# Patient Record
Sex: Female | Born: 1965 | Race: Black or African American | Hispanic: No | Marital: Single | Smoking: Former smoker
Health system: Southern US, Community
[De-identification: ages and names within clinical notes are randomized; demographics above are authoritative.]

## PROBLEM LIST (undated history)

## (undated) DIAGNOSIS — T8859XA Other complications of anesthesia, initial encounter: Secondary | ICD-10-CM

## (undated) DIAGNOSIS — A599 Trichomoniasis, unspecified: Secondary | ICD-10-CM

## (undated) DIAGNOSIS — G4730 Sleep apnea, unspecified: Secondary | ICD-10-CM

## (undated) DIAGNOSIS — I509 Heart failure, unspecified: Secondary | ICD-10-CM

## (undated) DIAGNOSIS — I2721 Secondary pulmonary arterial hypertension: Secondary | ICD-10-CM

## (undated) DIAGNOSIS — R0602 Shortness of breath: Secondary | ICD-10-CM

## (undated) DIAGNOSIS — I10 Essential (primary) hypertension: Secondary | ICD-10-CM

## (undated) DIAGNOSIS — U071 COVID-19: Secondary | ICD-10-CM

## (undated) DIAGNOSIS — I2699 Other pulmonary embolism without acute cor pulmonale: Secondary | ICD-10-CM

## (undated) DIAGNOSIS — K259 Gastric ulcer, unspecified as acute or chronic, without hemorrhage or perforation: Secondary | ICD-10-CM

## (undated) DIAGNOSIS — Z90710 Acquired absence of both cervix and uterus: Secondary | ICD-10-CM

## (undated) DIAGNOSIS — E119 Type 2 diabetes mellitus without complications: Secondary | ICD-10-CM

## (undated) DIAGNOSIS — R911 Solitary pulmonary nodule: Secondary | ICD-10-CM

## (undated) DIAGNOSIS — R7303 Prediabetes: Secondary | ICD-10-CM

## (undated) DIAGNOSIS — N83201 Unspecified ovarian cyst, right side: Secondary | ICD-10-CM

## (undated) DIAGNOSIS — Z803 Family history of malignant neoplasm of breast: Secondary | ICD-10-CM

## (undated) DIAGNOSIS — N920 Excessive and frequent menstruation with regular cycle: Secondary | ICD-10-CM

## (undated) DIAGNOSIS — D649 Anemia, unspecified: Secondary | ICD-10-CM

## (undated) DIAGNOSIS — G4733 Obstructive sleep apnea (adult) (pediatric): Secondary | ICD-10-CM

## (undated) DIAGNOSIS — M1990 Unspecified osteoarthritis, unspecified site: Secondary | ICD-10-CM

## (undated) DIAGNOSIS — E559 Vitamin D deficiency, unspecified: Secondary | ICD-10-CM

## (undated) DIAGNOSIS — C50212 Malignant neoplasm of upper-inner quadrant of left female breast: Secondary | ICD-10-CM

## (undated) DIAGNOSIS — M109 Gout, unspecified: Secondary | ICD-10-CM

## (undated) DIAGNOSIS — I82409 Acute embolism and thrombosis of unspecified deep veins of unspecified lower extremity: Secondary | ICD-10-CM

## (undated) DIAGNOSIS — T4145XA Adverse effect of unspecified anesthetic, initial encounter: Secondary | ICD-10-CM

## (undated) HISTORY — DX: Malignant neoplasm of upper-inner quadrant of left female breast: C50.212

## (undated) HISTORY — DX: Type 2 diabetes mellitus without complications: E11.9

## (undated) HISTORY — DX: Excessive and frequent menstruation with regular cycle: N92.0

## (undated) HISTORY — DX: Unspecified osteoarthritis, unspecified site: M19.90

## (undated) HISTORY — DX: Essential (primary) hypertension: I10

## (undated) HISTORY — DX: Obstructive sleep apnea (adult) (pediatric): G47.33

## (undated) HISTORY — DX: Acute embolism and thrombosis of unspecified deep veins of unspecified lower extremity: I82.409

## (undated) HISTORY — DX: Family history of malignant neoplasm of breast: Z80.3

## (undated) HISTORY — DX: Other pulmonary embolism without acute cor pulmonale: I26.99

## (undated) HISTORY — DX: Heart failure, unspecified: I50.9

## (undated) HISTORY — DX: Sleep apnea, unspecified: G47.30

## (undated) HISTORY — DX: Unspecified ovarian cyst, right side: N83.201

## (undated) HISTORY — DX: Prediabetes: R73.03

## (undated) HISTORY — DX: Gout, unspecified: M10.9

## (undated) HISTORY — PX: COLONOSCOPY: SHX174

## (undated) HISTORY — DX: Trichomoniasis, unspecified: A59.9

## (undated) HISTORY — DX: Solitary pulmonary nodule: R91.1

## (undated) HISTORY — DX: COVID-19: U07.1

## (undated) HISTORY — DX: Vitamin D deficiency, unspecified: E55.9

## (undated) HISTORY — DX: Gastric ulcer, unspecified as acute or chronic, without hemorrhage or perforation: K25.9

## (undated) HISTORY — DX: Shortness of breath: R06.02

---

## 2006-03-30 HISTORY — PX: POLYPECTOMY: SHX149

## 2006-05-10 ENCOUNTER — Other Ambulatory Visit: Admission: RE | Admit: 2006-05-10 | Discharge: 2006-05-10 | Payer: Self-pay | Admitting: Gynecology

## 2006-05-26 ENCOUNTER — Encounter: Admission: RE | Admit: 2006-05-26 | Discharge: 2006-05-26 | Payer: Self-pay | Admitting: Gynecology

## 2006-06-03 ENCOUNTER — Encounter: Admission: RE | Admit: 2006-06-03 | Discharge: 2006-06-03 | Payer: Self-pay | Admitting: Gynecology

## 2006-06-23 ENCOUNTER — Encounter (INDEPENDENT_AMBULATORY_CARE_PROVIDER_SITE_OTHER): Payer: Self-pay | Admitting: *Deleted

## 2006-06-23 ENCOUNTER — Ambulatory Visit (HOSPITAL_BASED_OUTPATIENT_CLINIC_OR_DEPARTMENT_OTHER): Admission: RE | Admit: 2006-06-23 | Discharge: 2006-06-23 | Payer: Self-pay | Admitting: Gynecology

## 2007-07-24 ENCOUNTER — Emergency Department (HOSPITAL_COMMUNITY): Admission: EM | Admit: 2007-07-24 | Discharge: 2007-07-25 | Payer: Self-pay | Admitting: Emergency Medicine

## 2008-09-25 ENCOUNTER — Ambulatory Visit (HOSPITAL_COMMUNITY): Admission: RE | Admit: 2008-09-25 | Discharge: 2008-09-25 | Payer: Self-pay | Admitting: Family Medicine

## 2010-04-20 ENCOUNTER — Encounter: Payer: Self-pay | Admitting: Gynecology

## 2010-08-15 NOTE — H&P (Signed)
[name], [name]              ACCOUNT NO.:  [id number]   MEDICAL RECORD NO.:  [id number]          PATIENT TYPE:  AMB   LOCATION:  [facility]                         FACILITY:  [facility]   PHYSICIAN:  Juan H. Toney Rakes, M.D.DATE OF BIRTH:  06-19-1965   DATE OF ADMISSION:  DATE OF DISCHARGE:                              HISTORY & PHYSICAL   The patient is scheduled for surgery on Wednesday, June 23, 2006, at  1:00 p.m. at [facility].   CHIEF COMPLAINT:  1. Menometrorrhagia.  2. Endometrial polyp.   HISTORY:  The patient is a 45 year old gravida 3, para 1, ab 2 who has  been evaluated menometrorrhagia, had an endometrial biopsy in the office  with benign secretory endometrium with no hyperplasia identified. She  had a sonohysterogram done on May 21, 2006.  She was noted to have  intramural myoma measuring 36 x 28-mm, 38 x 32-mm to the right fundus,  and multiple cystic areas in the myometrium consistent with adenomyomas.  The right and left ovary were normal and sonohysterogram demonstrated  posterior wall echogenic lesion measuring 11 x 8-mm on the right  posterior wall suspicious for an endometrial polyp.  The patient is  scheduled to undergo resective scopic polypectomy and was seen in the  office for preoperative consultation today June 22, 2006, and a  laminaria was placed intracervically to facilitate the insertion of the  operative hysteroscope at the time of surgery.   PAST MEDICAL HISTORY:  SHE IS ALLERGIC TO SULFA AND AMPICILLIN.  She  smokes approximately 3 cigarettes per day. She also has had 2  miscarriages in the past, 1 cesarean section, and 1 vaginal delivery.   FAMILY HISTORY:  Significant for hypertension.   PHYSICAL EXAMINATION:  VITAL SIGNS:  The patient weight 224 pounds, 5  feet 5-1/2 inches tall.  HEENT:  Unremarkable.  NECK:  Supple.  Trachea midline.  No carotid bruits.  No thyromegaly.  LUNGS:  Clear to auscultation without rhonchi or  wheezes.  HEART:  Regular rate and rhythm.  No murmurs or gallops.  BREAST EXAM:  Not done.  ABDOMEN:  Soft, nontender, without rebound or guarding.  PELVIC:  Bartholin's, urethral, and Skene's glands within normal limits.  VAGINA AND CERVIX:  No lesions or discharge.  Uterus slightly irregular.  Adnexa difficult to evaluate due to the patient's weight of 224 pounds.  RECTAL EXAM:  Deferred.   ASSESSMENT:  This is a patient 45 years of age with menometrorrhagia.  Workup consisted of a benign endometrial biopsy, sonohysterogram  demonstrating a posterior polyp measuring 11 x 8-mm.  The patient with  several intramural myomas and myometrium suspicious for adenomyosis.  The patient wanted to proceed with this before she attempts to get  pregnant whether she may resort through IVF which probably will be  recommended at the age of 14 but she will also need a baseline [place] on  day 3 as well.  She was counseled as to the risks, benefits, and pros  and cons of resective scopic polypectomy.  Her preoperative labs had  demonstrated that she had 100,000 pure colony  counts of proteus and she  will receive Cefoxitin 1 gm IV intraoperatively.  All questions were  answered and  will follow accordingly.  OF NOTE: She was given a prescription of  Lortab 7.5/500 to take 1 p.o. q.4-6 hours in the event of any cramping,  especially since the laminaria was also placed today before her surgery.   PLAN:  As per assessment above.      Juan H. Toney Rakes, M.D.  Electronically Signed     JHF/MEDQ  D:  06/22/2006  T:  06/22/2006  Job:  [id number]

## 2010-08-15 NOTE — Op Note (Signed)
NAMEEVON, DEJARNETT              ACCOUNT NO.:  [id number]   MEDICAL RECORD NO.:  [id number]          PATIENT TYPE:  AMB   LOCATION:  [facility]                         FACILITY:  [facility]   PHYSICIAN:  Juan H. Toney Rakes, M.D.DATE OF BIRTH:  1965/11/22   DATE OF PROCEDURE:  06/23/2006  DATE OF DISCHARGE:                               OPERATIVE REPORT   INDICATIONS FOR OPERATION:  45 year old gravida 3, para 1 AB 2 with  history of menorrhagia and had an outpatient endometrial biopsy with  benign secretory endometrium and a sonohysterogram which had  demonstrated what appeared to be an endometrial polyp.   PREOPERATIVE DIAGNOSES:  1. Dysfunctional bleeding.  2. Endometrial polyp.   POSTOPERATIVE DIAGNOSES:  1. Dysfunctional bleeding.  2. Endometrial polyp.   ANESTHESIA:  General endotracheal anesthesia.   PROCEDURE PERFORMED:  Resectoscopic polypectomy.   FINDINGS:  The patient had what appeared to be a small polyp with  dimensions 11 x 8 mm in the posterior uterine wall which was very  friable, both tubal ostia were identified and no other abnormality in  the uterine cavity or in the endocervical canal were noted.   DESCRIPTION OF OPERATION:  After the patient adequately counseled she  was taken to the operating room where she underwent successful general  endotracheal anesthesia.  The patient received a gram of cefoxitin and  it was noted on preoperative lab that she had evidence of urinary tract  infections however, this wide spectrum antibiotic would cover it as  well.  In the operating room after general endotracheal anesthesia been  obtained the vagina and perineum were prepped and draped in usual  sterile fashion.  A laminaria that had been placed intracervically the  night before was removed to facilitate insertion of the operative  hysteroscope.  Single-tooth tenaculum was used in the anterior cervical  lip.  The uterus sounded to approximately 8 cm. The Elba Barman  operative  resectoscope was then introduced into the intrauterine cavity.  3%  sorbitol was distending media.  Systematic inspection of the entire  uterine cavity demonstrated a small polyp in the posterior uterine wall  and upon opening the operative resectoscope to resect the polyp, it came  off its pedicle and there was no bleeding.  This was passed off the  operative field for histological evaluation.  The rest of the cavity was  inspected, both tubal ostia were identified.  No other abnormality in  the intrauterine cavity of the cervical canal were noted.  Pictures were  obtained to keep a copy in the hospital records and a second copy at  [facility] to share with the patient at postop visit.  The  single-tooth tenaculum was removed.  There was no bleeding from the  tenaculum.  The patient  was extubated and transferred to recovery with stable vital signs.  Blood loss from procedure was minimal.  Fluid resuscitation 1000 mL of  lactated Ringer and there was 0 deficit of 3% sorbitol.  The patient was  transferred with stable vital signs after being extubated.      Juan H. Toney Rakes, M.D.  Electronically Signed  JHF/MEDQ  D:  06/23/2006  T:  06/23/2006  Job:  [id number]

## 2010-12-23 LAB — URINE MICROSCOPIC-ADD ON

## 2010-12-23 LAB — POCT CARDIAC MARKERS
CKMB, poc: 1
CKMB, poc: <1 — ABNORMAL LOW
Myoglobin, poc: 60.7
Operator id: 272551
Troponin i, poc: <0.05

## 2010-12-23 LAB — COMPREHENSIVE METABOLIC PANEL
ALT: 14
Albumin: 3.3 — ABNORMAL LOW
Alkaline Phosphatase: 71
BUN: 8
CO2: 23
Creatinine, Ser: 0.79
Potassium: 3.4 — ABNORMAL LOW
Sodium: 138

## 2010-12-23 LAB — URINALYSIS, ROUTINE W REFLEX MICROSCOPIC
Bilirubin Urine: NEGATIVE
Glucose, UA: NEGATIVE
Ketones, ur: NEGATIVE

## 2010-12-23 LAB — POCT I-STAT, CHEM 8
BUN: 9
Chloride: 110
Creatinine, Ser: <0.2 — ABNORMAL LOW
Hemoglobin: 11.2 — ABNORMAL LOW

## 2010-12-23 LAB — DIFFERENTIAL
Basophils Absolute: 0
Eosinophils Relative: 1
Lymphocytes Relative: 26
Lymphs Abs: 1.9
Monocytes Absolute: 0.4
Neutro Abs: 4.7
Neutrophils Relative %: 66

## 2010-12-23 LAB — CBC
Platelets: 289
WBC: 7.2

## 2011-04-28 ENCOUNTER — Other Ambulatory Visit: Payer: Self-pay

## 2011-04-28 ENCOUNTER — Encounter (HOSPITAL_COMMUNITY): Payer: Self-pay | Admitting: Emergency Medicine

## 2011-04-28 ENCOUNTER — Emergency Department (HOSPITAL_COMMUNITY): Payer: Self-pay

## 2011-04-28 ENCOUNTER — Emergency Department (HOSPITAL_COMMUNITY)
Admission: EM | Admit: 2011-04-28 | Discharge: 2011-04-28 | Disposition: A | Discharge status: Home / Self Care | Payer: Self-pay | Attending: Emergency Medicine | Admitting: Emergency Medicine

## 2011-04-28 DIAGNOSIS — R231 Pallor: Secondary | ICD-10-CM | POA: Insufficient documentation

## 2011-04-28 DIAGNOSIS — R0989 Other specified symptoms and signs involving the circulatory and respiratory systems: Secondary | ICD-10-CM | POA: Insufficient documentation

## 2011-04-28 DIAGNOSIS — R110 Nausea: Secondary | ICD-10-CM | POA: Insufficient documentation

## 2011-04-28 DIAGNOSIS — J189 Pneumonia, unspecified organism: Secondary | ICD-10-CM

## 2011-04-28 DIAGNOSIS — R059 Cough, unspecified: Secondary | ICD-10-CM | POA: Insufficient documentation

## 2011-04-28 DIAGNOSIS — N949 Unspecified condition associated with female genital organs and menstrual cycle: Secondary | ICD-10-CM | POA: Insufficient documentation

## 2011-04-28 DIAGNOSIS — R079 Chest pain, unspecified: Secondary | ICD-10-CM | POA: Insufficient documentation

## 2011-04-28 DIAGNOSIS — R05 Cough: Secondary | ICD-10-CM | POA: Insufficient documentation

## 2011-04-28 DIAGNOSIS — R0602 Shortness of breath: Secondary | ICD-10-CM | POA: Insufficient documentation

## 2011-04-28 DIAGNOSIS — D649 Anemia, unspecified: Secondary | ICD-10-CM

## 2011-04-28 LAB — CBC
HCT: 26.4 % — ABNORMAL LOW (ref 36.0–46.0)
MCHC: 27.3 g/dL — ABNORMAL LOW (ref 30.0–36.0)
MCV: 63.9 fL — ABNORMAL LOW (ref 78.0–100.0)
Platelets: 244 10*3/uL (ref 150–400)
RBC: 4.13 MIL/uL (ref 3.87–5.11)
WBC: 9.8 10*3/uL (ref 4.0–10.5)

## 2011-04-28 LAB — BASIC METABOLIC PANEL
CO2: 24 mEq/L (ref 19–32)
Creatinine, Ser: 0.75 mg/dL (ref 0.50–1.10)
GFR calc Af Amer: >90 mL/min (ref >90)
Glucose, Bld: 111 mg/dL — ABNORMAL HIGH (ref 70–99)
Sodium: 136 mEq/L (ref 135–145)

## 2011-04-28 LAB — DIFFERENTIAL
Eosinophils Absolute: 0.1 10*3/uL (ref 0.0–0.7)
Eosinophils Relative: 1 % (ref 0–5)
Lymphocytes Relative: 17 % (ref 12–46)
Neutro Abs: 7.3 10*3/uL (ref 1.7–7.7)

## 2011-04-28 LAB — ABO/RH: ABO/RH(D): O POS

## 2011-04-28 MED ORDER — DOXYCYCLINE HYCLATE 100 MG PO CAPS: 100.0000 mg | ORAL_CAPSULE | Freq: Two times a day (BID) | ORAL | Status: AC

## 2011-04-28 MED ORDER — MOXIFLOXACIN HCL 400 MG PO TABS
400.0000 mg | ORAL_TABLET | Freq: Once | ORAL | Status: AC
  Administered 2011-04-28: 400 mg via ORAL
  Filled 2011-04-28: qty 1

## 2011-04-28 MED ORDER — MOXIFLOXACIN HCL 400 MG PO TABS: 400.0000 mg | ORAL_TABLET | Freq: Every day | ORAL | Status: AC

## 2011-04-28 MED ORDER — OXYCODONE-ACETAMINOPHEN 5-325 MG PO TABS
2.0000 | ORAL_TABLET | Freq: Once | ORAL | Status: AC
  Administered 2011-04-28: 2 via ORAL
  Filled 2011-04-28: qty 2

## 2011-04-28 NOTE — ED Notes (Signed)
PT. REPORTS MID STERNAL CHEST PAIN WITH SOB , DRY COUGH AND NAUSEA .

## 2011-04-28 NOTE — ED Notes (Signed)
Discharge instructions reviewed, verbalizes understanding.  No questions asked; no further c/o's voiced.  Pt ambulatory to lobby.  NAD noted.

## 2011-04-28 NOTE — ED Provider Notes (Signed)
Patient with history of anemia here with pneumonia.  Patient treated with avelox by Dr. Lita Mains.  Patient transfused 2 units prbc given here and patient feels better.  Plan op avelox.  Shaune Pollack, MD 04/28/11 [phone]

## 2011-04-28 NOTE — ED Provider Notes (Signed)
History     CSN: [id number]  Arrival date & time 04/28/11  0402   First MD Initiated Contact with Patient 04/28/11 [phone]      Chief Complaint  Patient presents with  . Chest Pain    (Consider location/radiation/quality/duration/timing/severity/associated sxs/prior treatment) Patient is a 46 y.o. female presenting with chest pain. The history is provided by the patient.  Chest Pain The chest pain began yesterday. Chest pain occurs constantly. The chest pain is worsening. The pain is associated with breathing and coughing. The quality of the pain is described as pleuritic. The pain does not radiate. Chest pain is worsened by deep breathing. Primary symptoms include shortness of breath, cough and nausea. Pertinent negatives for primary symptoms include no fever, no abdominal pain, no vomiting and no dizziness.  Pertinent negatives for associated symptoms include no diaphoresis, no numbness and no weakness.     History reviewed. No pertinent past medical history.  History reviewed. No pertinent past surgical history.  No family history on file.  History  Substance Use Topics  . Smoking status: Never Smoker   . Smokeless tobacco: Not on file  . Alcohol Use: Yes    OB History    Grav Para Term Preterm Abortions TAB SAB Ect Mult Living                  Review of Systems  Constitutional: Negative for fever, chills and diaphoresis.  Respiratory: Positive for cough and shortness of breath.   Cardiovascular: Positive for chest pain.  Gastrointestinal: Positive for nausea. Negative for vomiting and abdominal pain.  Genitourinary: Positive for menstrual problem. Negative for vaginal bleeding and pelvic pain.  Musculoskeletal: Negative for back pain.  Skin: Negative for color change, pallor and rash.  Neurological: Negative for dizziness, weakness, numbness and headaches.    Allergies  Ampicillin and Sulfa antibiotics  Home Medications   Current Outpatient Rx  Name Route  Sig Dispense Refill  . DOXYCYCLINE HYCLATE 100 MG PO CAPS Oral Take 1 capsule (100 mg total) by mouth 2 (two) times daily. 20 capsule 0  . MOXIFLOXACIN HCL 400 MG PO TABS Oral Take 1 tablet (400 mg total) by mouth daily. 10 tablet 0    BP 106/57  Pulse 78  Temp(Src) 98.5 F (36.9 C) (Oral)  Resp 19  SpO2 99%  LMP 04/14/2011  Physical Exam  Nursing note and vitals reviewed. Constitutional: She is oriented to person, place, and time. She appears well-developed and well-nourished. No distress.  HENT:  Head: Normocephalic and atraumatic.  Mouth/Throat: Oropharynx is clear and moist.  Eyes: EOM are normal. Pupils are equal, round, and reactive to light.  Neck: Normal range of motion. Neck supple.  Cardiovascular: Normal rate and regular rhythm.   Pulmonary/Chest: Effort normal. No respiratory distress. She has no wheezes. She has rales.       Rales in L chest base  Abdominal: Soft. Bowel sounds are normal. There is no tenderness. There is no rebound and no guarding.  Musculoskeletal: Normal range of motion. She exhibits no edema and no tenderness.  Neurological: She is alert and oriented to person, place, and time.  Skin: Skin is warm and dry. No rash noted. No erythema.       Mild pallor noted  Psychiatric: She has a normal mood and affect. Her behavior is normal.    ED Course  Procedures (including critical care time)  Labs Reviewed  CBC - Abnormal; Notable for the following:    Hemoglobin 7.2 (*)  HCT 26.4 (*)    MCV 63.9 (*)    MCH 17.4 (*)    MCHC 27.3 (*)    RDW 20.8 (*)    All other components within normal limits  BASIC METABOLIC PANEL - Abnormal; Notable for the following:    Glucose, Bld 111 (*)    All other components within normal limits  DIFFERENTIAL  POCT I-STAT TROPONIN I  CULTURE, BLOOD (ROUTINE X 2)  CULTURE, BLOOD (ROUTINE X 2)  PREPARE RBC (CROSSMATCH)  TYPE AND SCREEN  ABO/RH  BLOOD TRANSFUSION REPORT - SCANNED   No results found.   1.  Pneumonia   2. Anemia      Date: 04/28/2011  Rate: 96  Rhythm: normal sinus rhythm  QRS Axis: normal  Intervals: normal  ST/T Wave abnormalities: nonspecific ST changes  Conduction Disutrbances:none  Narrative Interpretation:   Old EKG Reviewed: none available    MDM    On further questioning, pt states she has anemia from heavy menstrual cycles and is followed by OB-gyn. Her last known HGB was around 9. No current bleeding. Will transfuse in ED and D/C home.         Julianne Rice, MD 05/01/11 [phone]

## 2011-04-29 LAB — TYPE AND SCREEN
Antibody Screen: NEGATIVE
Unit division: 0

## 2011-05-04 LAB — CULTURE, BLOOD (ROUTINE X 2): Culture: NO GROWTH

## 2011-05-05 LAB — CULTURE, BLOOD (ROUTINE X 2)

## 2011-05-05 NOTE — ED Notes (Signed)
[facility] lab reported pt has positive BC; chart taken to dr Lita Mains for review.

## 2011-05-05 NOTE — ED Notes (Signed)
Dr Lita Mains states to call pt and check on her; if feeling better f/u with pcp; if fever, chills, weakness, or other problems return to ED. States she feels fine. Encouraged to return to ED as needed or any concerns.

## 2011-07-06 ENCOUNTER — Encounter: Payer: Self-pay | Admitting: Obstetrics & Gynecology

## 2011-08-07 ENCOUNTER — Encounter: Payer: Self-pay | Admitting: Obstetrics & Gynecology

## 2011-09-10 ENCOUNTER — Encounter: Payer: Self-pay | Admitting: Obstetrics & Gynecology

## 2012-03-30 DIAGNOSIS — I82409 Acute embolism and thrombosis of unspecified deep veins of unspecified lower extremity: Secondary | ICD-10-CM

## 2012-03-30 DIAGNOSIS — I2699 Other pulmonary embolism without acute cor pulmonale: Secondary | ICD-10-CM

## 2012-03-30 HISTORY — DX: Other pulmonary embolism without acute cor pulmonale: I26.99

## 2012-03-30 HISTORY — DX: Acute embolism and thrombosis of unspecified deep veins of unspecified lower extremity: I82.409

## 2012-09-21 ENCOUNTER — Encounter: Payer: Self-pay | Admitting: Obstetrics & Gynecology

## 2012-10-04 ENCOUNTER — Encounter (HOSPITAL_COMMUNITY): Payer: Self-pay

## 2012-10-04 ENCOUNTER — Emergency Department (HOSPITAL_COMMUNITY): Payer: Self-pay

## 2012-10-04 ENCOUNTER — Inpatient Hospital Stay (HOSPITAL_COMMUNITY)
Admission: EM | Admit: 2012-10-04 | Discharge: 2012-10-07 | DRG: 299 | Disposition: A | Discharge status: Home / Self Care | Payer: MEDICAID | Attending: Internal Medicine | Admitting: Internal Medicine

## 2012-10-04 DIAGNOSIS — Z791 Long term (current) use of non-steroidal anti-inflammatories (NSAID): Secondary | ICD-10-CM

## 2012-10-04 DIAGNOSIS — R911 Solitary pulmonary nodule: Secondary | ICD-10-CM

## 2012-10-04 DIAGNOSIS — I2699 Other pulmonary embolism without acute cor pulmonale: Secondary | ICD-10-CM | POA: Diagnosis present

## 2012-10-04 DIAGNOSIS — Z7982 Long term (current) use of aspirin: Secondary | ICD-10-CM

## 2012-10-04 DIAGNOSIS — Z87891 Personal history of nicotine dependence: Secondary | ICD-10-CM

## 2012-10-04 DIAGNOSIS — I8290 Acute embolism and thrombosis of unspecified vein: Secondary | ICD-10-CM | POA: Diagnosis present

## 2012-10-04 DIAGNOSIS — Z881 Allergy status to other antibiotic agents status: Secondary | ICD-10-CM

## 2012-10-04 DIAGNOSIS — I824Y9 Acute embolism and thrombosis of unspecified deep veins of unspecified proximal lower extremity: Principal | ICD-10-CM | POA: Diagnosis present

## 2012-10-04 DIAGNOSIS — I82409 Acute embolism and thrombosis of unspecified deep veins of unspecified lower extremity: Secondary | ICD-10-CM

## 2012-10-04 DIAGNOSIS — M79609 Pain in unspecified limb: Secondary | ICD-10-CM

## 2012-10-04 DIAGNOSIS — D509 Iron deficiency anemia, unspecified: Secondary | ICD-10-CM | POA: Diagnosis present

## 2012-10-04 DIAGNOSIS — E876 Hypokalemia: Secondary | ICD-10-CM | POA: Diagnosis present

## 2012-10-04 DIAGNOSIS — R0602 Shortness of breath: Secondary | ICD-10-CM

## 2012-10-04 DIAGNOSIS — I82402 Acute embolism and thrombosis of unspecified deep veins of left lower extremity: Secondary | ICD-10-CM

## 2012-10-04 HISTORY — DX: Anemia, unspecified: D64.9

## 2012-10-04 LAB — BASIC METABOLIC PANEL
Calcium: 9.1 mg/dL (ref 8.4–10.5)
GFR calc Af Amer: >90 mL/min (ref >90)
GFR calc non Af Amer: >90 mL/min (ref >90)
Sodium: 136 mEq/L (ref 135–145)

## 2012-10-04 LAB — CBC WITH DIFFERENTIAL/PLATELET
Basophils Relative: 1 % (ref 0–1)
Eosinophils Relative: 1 % (ref 0–5)
HCT: 21.2 % — ABNORMAL LOW (ref 36.0–46.0)
Hemoglobin: 5.7 g/dL — CL (ref 12.0–15.0)
MCHC: 26.9 g/dL — ABNORMAL LOW (ref 30.0–36.0)
MCV: 62.9 fL — ABNORMAL LOW (ref 78.0–100.0)
Monocytes Absolute: 0.7 10*3/uL (ref 0.1–1.0)
Monocytes Relative: 8 % (ref 3–12)
Neutro Abs: 5.6 10*3/uL (ref 1.7–7.7)
RDW: 20.5 % — ABNORMAL HIGH (ref 11.5–15.5)

## 2012-10-04 LAB — CBC
HCT: 21.7 % — ABNORMAL LOW (ref 36.0–46.0)
Hemoglobin: 5.7 g/dL — CL (ref 12.0–15.0)
MCH: 16.6 pg — ABNORMAL LOW (ref 26.0–34.0)
MCHC: 26.3 g/dL — ABNORMAL LOW (ref 30.0–36.0)
MCV: 63.3 fL — ABNORMAL LOW (ref 78.0–100.0)
Platelets: 198 K/uL (ref 150–400)
RBC: 3.43 MIL/uL — ABNORMAL LOW (ref 3.87–5.11)
RDW: 20.2 % — ABNORMAL HIGH (ref 11.5–15.5)
WBC: 7.7 K/uL (ref 4.0–10.5)

## 2012-10-04 LAB — PROTIME-INR
INR: 1.14 (ref 0.00–1.49)
Prothrombin Time: 14.4 s (ref 11.6–15.2)

## 2012-10-04 LAB — D-DIMER, QUANTITATIVE: D-Dimer, Quant: 5.89 ug/mL-FEU — ABNORMAL HIGH (ref 0.00–0.48)

## 2012-10-04 MED ORDER — MORPHINE SULFATE 2 MG/ML IJ SOLN: 2.0000 mg | INTRAMUSCULAR | Status: DC | PRN

## 2012-10-04 MED ORDER — ENOXAPARIN SODIUM 100 MG/ML ~~LOC~~ SOLN
100.0000 mg | Freq: Once | SUBCUTANEOUS | Status: AC
  Administered 2012-10-04: 100 mg via SUBCUTANEOUS
  Filled 2012-10-04: qty 1

## 2012-10-04 MED ORDER — HEPARIN (PORCINE) IN NACL 100-0.45 UNIT/ML-% IJ SOLN
1300.0000 [IU]/h | INTRAMUSCULAR | Status: DC
  Administered 2012-10-04 – 2012-10-05 (×2): 1300 [IU]/h via INTRAVENOUS
  Filled 2012-10-04 (×4): qty 250

## 2012-10-04 MED ORDER — WARFARIN SODIUM 10 MG PO TABS
10.0000 mg | ORAL_TABLET | Freq: Once | ORAL | Status: DC
  Filled 2012-10-04: qty 1

## 2012-10-04 MED ORDER — WARFARIN SODIUM 10 MG PO TABS
10.0000 mg | ORAL_TABLET | Freq: Once | ORAL | Status: AC
  Administered 2012-10-04: 10 mg via ORAL
  Filled 2012-10-04: qty 1

## 2012-10-04 MED ORDER — IOHEXOL 350 MG/ML SOLN
100.0000 mL | Freq: Once | INTRAVENOUS | Status: AC | PRN
  Administered 2012-10-04: 100 mL via INTRAVENOUS

## 2012-10-04 MED ORDER — FUROSEMIDE 10 MG/ML IJ SOLN
20.0000 mg | Freq: Once | INTRAMUSCULAR | Status: AC
  Administered 2012-10-05: 20 mg via INTRAVENOUS
  Filled 2012-10-04: qty 2

## 2012-10-04 MED ORDER — SODIUM CHLORIDE 0.9 % IJ SOLN
3.0000 mL | Freq: Two times a day (BID) | INTRAMUSCULAR | Status: DC
  Administered 2012-10-05 – 2012-10-06 (×4): 3 mL via INTRAVENOUS

## 2012-10-04 MED ORDER — WARFARIN - PHARMACIST DOSING INPATIENT: Freq: Every day | Status: DC

## 2012-10-04 MED ORDER — ACETAMINOPHEN 325 MG PO TABS: 650.0000 mg | ORAL_TABLET | Freq: Four times a day (QID) | ORAL | Status: DC | PRN

## 2012-10-04 MED ORDER — COUMADIN BOOK
Freq: Once | Status: AC
  Administered 2012-10-04: 1
  Filled 2012-10-04: qty 1

## 2012-10-04 MED ORDER — POTASSIUM CHLORIDE CRYS ER 20 MEQ PO TBCR
40.0000 meq | EXTENDED_RELEASE_TABLET | Freq: Once | ORAL | Status: AC
  Administered 2012-10-04: 40 meq via ORAL
  Filled 2012-10-04: qty 2

## 2012-10-04 MED ORDER — SODIUM CHLORIDE 0.9 % IJ SOLN: 3.0000 mL | INTRAMUSCULAR | Status: DC | PRN

## 2012-10-04 MED ORDER — WARFARIN VIDEO
Freq: Once | Status: AC
  Administered 2012-10-05: 10:00:00

## 2012-10-04 MED ORDER — ONDANSETRON HCL 4 MG/2ML IJ SOLN: 4.0000 mg | Freq: Four times a day (QID) | INTRAMUSCULAR | Status: DC | PRN

## 2012-10-04 MED ORDER — ONDANSETRON HCL 4 MG PO TABS: 4.0000 mg | ORAL_TABLET | Freq: Four times a day (QID) | ORAL | Status: DC | PRN

## 2012-10-04 MED ORDER — ACETAMINOPHEN 650 MG RE SUPP: 650.0000 mg | Freq: Four times a day (QID) | RECTAL | Status: DC | PRN

## 2012-10-04 MED ORDER — OXYCODONE HCL 5 MG PO TABS
5.0000 mg | ORAL_TABLET | ORAL | Status: DC | PRN
  Administered 2012-10-04 – 2012-10-05 (×3): 5 mg via ORAL
  Filled 2012-10-04 (×3): qty 1

## 2012-10-04 MED ORDER — SODIUM CHLORIDE 0.9 % IV SOLN: 250.0000 mL | INTRAVENOUS | Status: DC | PRN

## 2012-10-04 NOTE — ED Provider Notes (Addendum)
History    CSN: [id number] Arrival date & time 10/04/12  1345  First MD Initiated Contact with Patient 10/04/12 1349     Chief Complaint  Patient presents with  . Shortness of Breath    HPI Patient developed exertional shortness of breath at the new pastor days.  She's never had shortness of breath like this before.  She denies chest pain orthopnea.  She reports a new cough.  She also reports new swelling of her left lower extremity with past several days and pain below her left knee.  No prior history of DVT.  No history of pulmonary embolism.  She denies fevers or chills.  No productive cough.  No history of cardiac disease or congestive heart failure.  No recent long travel or surgery.  She is not on estrogens.  She does not smoke cigarettes.  Symptoms are mild to moderate in severity.  Nothing improves her symptoms.   History reviewed. No pertinent past medical history. History reviewed. No pertinent past surgical history. No family history on file. History  Substance Use Topics  . Smoking status: Former Research scientist (life sciences)  . Smokeless tobacco: Not on file  . Alcohol Use: Yes     Comment: social   OB History   Grav Para Term Preterm Abortions TAB SAB Ect Mult Living                 Review of Systems  All other systems reviewed and are negative.    Allergies  Ampicillin and Sulfa antibiotics  Home Medications  No current outpatient prescriptions on file. BP 146/79  Pulse 97  Temp(Src) 98.3 F (36.8 C) (Oral)  Resp 20  Wt 230 lb (104.327 kg)  SpO2 94%  LMP 09/27/2012 Physical Exam  Nursing note and vitals reviewed. Constitutional: She is oriented to person, place, and time. She appears well-developed and well-nourished. No distress.  HENT:  Head: Normocephalic and atraumatic.  Eyes: EOM are normal.  Neck: Normal range of motion.  Cardiovascular: Normal rate, regular rhythm and normal heart sounds.   Pulmonary/Chest: Effort normal and breath sounds normal.  Abdominal:  Soft. She exhibits no distension. There is no tenderness.  Musculoskeletal: Normal range of motion.  Mild tenderness in left popliteal region without evidence of left lower extremity swelling as compared to right.  No tenderness of her left medial thigh  Neurological: She is alert and oriented to person, place, and time.  Skin: Skin is warm and dry.  Psychiatric: She has a normal mood and affect. Judgment normal.    ED Course  Procedures (including critical care time)   Date: 10/04/2012  Rate: 93  Rhythm: normal sinus rhythm  QRS Axis: normal  Intervals: normal  ST/T Wave abnormalities: normal  Conduction Disutrbances: none  Narrative Interpretation:   Old EKG Reviewed: No significant changes noted     *PRELIMINARY RESULTS*  Vascular Ultrasound  Left lower extremity venous duplex has been completed. Preliminary findings: Left= positive for DVT involving the popliteal vein and posterior tibial veins.   Labs Reviewed  BASIC METABOLIC PANEL - Abnormal; Notable for the following:    Potassium 3.3 (*)    Glucose, Bld 106 (*)    All other components within normal limits  D-DIMER, QUANTITATIVE - Abnormal; Notable for the following:    D-Dimer, Quant 5.89 (*)    All other components within normal limits  CBC WITH DIFFERENTIAL  TYPE AND SCREEN   Dg Chest 2 View  10/04/2012   *RADIOLOGY REPORT*  Clinical  Data: Shortness of breath, history of bronchitis  CHEST - 2 VIEW  Comparison: 04/28/2011  Findings: Borderline cardiomegaly.  No acute infiltrate or pleural effusion.  No pulmonary edema. Bony thorax is stable.  IMPRESSION: No active disease.  Borderline cardiomegaly.   Original Report Authenticated By: Lahoma Crocker, M.D.   I personally reviewed the imaging tests through PACS system I reviewed available ER/hospitalization records through the EMR   No diagnosis found.  MDM  Patient is positive for DVT in her left lower extremity.  This is likely the presentation of a DVT and now new  pulmonary embolism.  CT scan of her chest is pending at this time.  The patient has been dosed with 1 mg per kilogram of Lovenox.   Hoy Morn, MD 10/04/12 [place], MD 10/04/12 1524

## 2012-10-04 NOTE — Progress Notes (Signed)
Patient activated my chart. Lucius Conn BSN, RN-BC Admissions RN  10/04/2012 4:46 PM

## 2012-10-04 NOTE — Progress Notes (Signed)
[facility] has seen patient. Pt stated that she was pending insurance at her job. Stated she should have it in September. Provided her with a list of primary care resources.

## 2012-10-04 NOTE — Progress Notes (Signed)
ANTICOAGULATION CONSULT NOTE - Initial Consult  Pharmacy Consult for Heparin/Warfarin Indication: pulmonary embolus and DVT  Allergies  Allergen Reactions  . Ampicillin Other (See Comments)    Severe abdominal pain, dizziness (penicillin is okay)  . Sulfa Antibiotics Other (See Comments)    Very bad yeast infection    Patient Measurements: Height: 5' 5" (165.1 cm) Weight: 230 lb (104.327 kg) IBW/kg (Calculated) : 57 Heparin Dosing Weight: 81 kg  Vital Signs: Temp: 98.5 F (36.9 C) (07/08 1700) Temp src: Oral (07/08 1700) BP: 146/75 mmHg (07/08 1700) Pulse Rate: 97 (07/08 1700)  Labs:  Recent Labs  10/04/12 1414 10/04/12 1651  HGB  --  5.7*  HCT  --  21.2*  PLT  --  192  CREATININE 0.72  --     Estimated Creatinine Clearance: 105.3 ml/min (by C-G formula based on Cr of 0.72).   Medical History: Past Medical History  Diagnosis Date  . Medical history non-contributory   . Anemia      Assessment: 32 yoF presents with DVT and PE confirmed with LE venous doppler and CTA.  No predispositions to VTE other than prolonged standing at work.  Received 1 dose of treatment dose Lovenox (1 mg/kg) in ED at 1500 today.  Admitting MD switching to IV heparin and will also begin warfarin per pharmacy.  Baseline labs showed Hgb 5.7.  MD ordered 3 units of PRBC.  Goal of Therapy:  INR 2-3 Heparin level 0.3-0.7 units/ml Monitor platelets by anticoagulation protocol: Yes   Plan:  1.  Start IV heparin without bolus 9 hours following 1 mg/kg dose of Lovenox given this afternoon.  At midnight, start IV heparin at 1300 units/hr (13 ml/hr).  Check heparin level 6 hours following start of heparin, will consider residual effect from Lovenox on level. 2.  Warfarin 10 mg po once tonight.  Will provide patient with warfarin education book and video.  Pharmacist education to follow.  Daily INR. 3.  Monitor Hgb closely.  Daily CBC while on heparin.  Hershal Coria 10/04/2012,5:23 PM

## 2012-10-04 NOTE — Progress Notes (Signed)
Utilization Review completed.  Terin Dierolf RN CM  

## 2012-10-04 NOTE — Progress Notes (Signed)
*  PRELIMINARY RESULTS* Vascular Ultrasound Left lower extremity venous duplex has been completed.  Preliminary findings: Left= positive for DVT involving the popliteal vein and posterior tibial veins.    Landry Mellow, RDMS, RVT  10/04/2012, 2:36 PM

## 2012-10-04 NOTE — ED Notes (Signed)
Pt c/o LLE pain with SOB since Sunday, states SOB is getting worse on exertion.

## 2012-10-04 NOTE — H&P (Addendum)
PCP:   No primary provider on file.   Chief Complaint:  SOB and left leg discomfort.   HPI: This is a 47 year old female with history of uterine myomectomy approximately 7 years ago, ex-smoker, quit 1 year ago, but otherwise, no significant medical history. She is a CNA, and also works as a Geophysicist/field seismologist, having to stand for prolonged periods. According to her, she felt increasing left leg discomfort and cramping on 10/02/12, which became progressively worse, particularly on ambulation, causing her to limp occasionallyl. Later the same day, she became short of breath, and developed a dry cough, which caused her to feel sore in bilateral lower rib areas. SOB has progressed, and she can now ambulate only a few feet, before having to stop to rest. She has also become progressively fatigued. As symptoms have persisted, she drove to the ED today. D-Dimer was found to be elevated at 5.89, LE venous doppler demonstrated DVT involving the popliteal vein and posterior tibial veins of the left leg, and CTA showed bilateral pulmonary emboli. Patient has no personal or family history of VTE, is not on contraceptive medication, and has no history of recent long-distance travel.   Allergies:   Allergies  Allergen Reactions  . Ampicillin Other (See Comments)    Severe abdominal pain, dizziness (penicillin is okay)  . Sulfa Antibiotics Other (See Comments)    Very bad yeast infection      Past Medical History  Diagnosis Date  . Medical history non-contributory   . Anemia     Past Surgical History  Procedure Laterality Date  . Uterine fibroid surgery      Prior to Admission medications   Medication Sig Start Date End Date Taking? Authorizing Provider  aspirin 325 MG tablet Take 325 mg by mouth daily.   Yes Historical Provider, MD  ibuprofen (ADVIL,MOTRIN) 200 MG tablet Take 600 mg by mouth every 6 (six) hours as needed for pain (PAIN/CRAMPS).   Yes Historical Provider, MD    Social History: Patient  reports that she has quit smoking. She does not have any smokeless tobacco history on file. She reports that  drinks alcohol. She reports that she does not use illicit drugs.  Family History: father is aged 45 years, with HTN and prostate problems. Mother died at age 57 years. She was hypertensive, and on HD.   Review of Systems:  As per HPI and chief complaint. Patent denies diminished appetite, weight loss, fever, chills, headache, blurred vision, difficulty in speaking, dysphagia, orthopnea, paroxysmal nocturnal dyspnea, nausea, diaphoresis, abdominal pain, vomiting, diarrhea, belching, heartburn, hematemesis, melena, dysuria, nocturia, urinary frequency, hematochezia. The rest of the systems review is negative.  Physical Exam:  General:  Patient does not appear to be in obvious acute distress. Alert, communicative, fully oriented, talking in complete sentences, not short of breath at rest.  HEENT:  Mild clinical pallor, no jaundice, no conjunctival injection or discharge. Hydration status is fair.  NECK:  Supple, JVP not seen, no carotid bruits, no palpable lymphadenopathy, no palpable goiter. CHEST:  Clinically clear to auscultation, no wheezes, no crackles. HEART:  Sounds 1 and 2 heard, normal, regular, no murmurs. ABDOMEN:  Moderately obese, soft, non-tender, no palpable organomegaly, no palpable masses, normal bowel sounds. GENITALIA:  Not examined. LOWER EXTREMITIES:  No pitting edema, palpable peripheral pulses. No tenseness or tenderness both calves.  MUSCULOSKELETAL SYSTEM:  Unremarkable. CENTRAL NERVOUS SYSTEM:  No focal neurologic deficit on gross examination.  Labs on Admission:  Results for orders placed during  the hospital encounter of 10/04/12 (from the past 48 hour(s))  BASIC METABOLIC PANEL     Status: Abnormal   Collection Time    10/04/12  2:14 PM      Result Value Range   Sodium 136  135 - 145 mEq/L   Potassium 3.3 (*) 3.5 - 5.1 mEq/L   Chloride 101  96 - 112 mEq/L    CO2 23  19 - 32 mEq/L   Glucose, Bld 106 (*) 70 - 99 mg/dL   BUN 7  6 - 23 mg/dL   Creatinine, Ser 0.72  0.50 - 1.10 mg/dL   Calcium 9.1  8.4 - 10.5 mg/dL   GFR calc non Af Amer >90  >90 mL/min   GFR calc Af Amer >90  >90 mL/min   Comment:            The eGFR has been calculated     using the CKD EPI equation.     This calculation has not been     validated in all clinical     situations.     eGFR's persistently     <90 mL/min signify     possible Chronic Kidney Disease.  D-DIMER, QUANTITATIVE     Status: Abnormal   Collection Time    10/04/12  2:14 PM      Result Value Range   D-Dimer, Quant 5.89 (*) 0.00 - 0.48 ug/mL-FEU   Comment:            AT THE INHOUSE ESTABLISHED CUTOFF     VALUE OF 0.48 ug/mL FEU,     THIS ASSAY HAS BEEN DOCUMENTED     IN THE LITERATURE TO HAVE     A SENSITIVITY AND NEGATIVE     PREDICTIVE VALUE OF AT LEAST     98 TO 99%.  THE TEST RESULT     SHOULD BE CORRELATED WITH     AN ASSESSMENT OF THE CLINICAL     PROBABILITY OF DVT / VTE.  TYPE AND SCREEN     Status: None   Collection Time    10/04/12  2:22 PM      Result Value Range   ABO/RH(D) O POS     Antibody Screen NEG     Sample Expiration 10/07/2012      Radiological Exams on Admission: Dg Chest 2 View  10/04/2012   *RADIOLOGY REPORT*  Clinical Data: Shortness of breath, history of bronchitis  CHEST - 2 VIEW  Comparison: 04/28/2011  Findings: Borderline cardiomegaly.  No acute infiltrate or pleural effusion.  No pulmonary edema. Bony thorax is stable.  IMPRESSION: No active disease.  Borderline cardiomegaly.   Original Report Authenticated By: Lahoma Crocker, M.D.   Ct Angio Chest W/cm &/or Wo Cm  10/04/2012   *RADIOLOGY REPORT*  Clinical Data: Shortness of breath and DVT  CT ANGIOGRAPHY CHEST  Technique:  Multidetector CT imaging of the chest using the standard protocol during bolus administration of intravenous contrast. Multiplanar reconstructed images including MIPs were obtained and reviewed to  evaluate the vascular anatomy.  Contrast: 171m OMNIPAQUE IOHEXOL 350 MG/ML SOLN  Comparison: None.  Findings: There is no pleural effusion.  Pulmonary nodule in the left lower lobe measures 5 mm, image 40/series 12.  There is no airspace consolidation or atelectasis identified.  The heart size is normal.  No pericardial effusion.  There is no mediastinal or hilar adenopathy.  There are bilateral pulmonary artery filling defects.  These predominately involve the lower lobe pulmonary  arteries.  Limited imaging through the upper abdomen is unremarkable.  Review of the visualized osseous structures is unremarkable.  IMPRESSION:  1.  Exam is positive for bilateral pulmonary emboli. 2.  Pulmonary nodule in the left lower lobe measures 5 mm. If the patient is at high risk for bronchogenic carcinoma, follow-up chest CT at 6-12 months is recommended.  If the patient is at low risk for bronchogenic carcinoma, follow-up chest CT at 12 months is recommended.  This recommendation follows the consensus statement: Guidelines for Management of Small Pulmonary Nodules Detected on CT Scans: A Statement from the [place] as published in Radiology 2005; 237:395-400.   Original Report Authenticated By: Kerby Moors, M.D.    Assessment/Plan Active Problems:    1. VTE (venous thromboembolism): Patient presented with progressive SOB and dry cough, preceded by LLE discomfort and cramping, D-Dimer was elevated at 5. 89, and CTA demonstrated bilateral PE. LE venous doppler confirmed LLE DVT involving the popliteal vein and posterior tibial veins. She appears to have no predispositions, other than prolonged standing at work. Fortunately, she is hemodynamically stabler at this time. CBC is not available, neither is coagulation profile. We shall check CBC, PTT/PT/INR, send of blood for thrombophilia screen, and commence ivi Heparin infusion, as well as concommitant Coumadin. She will be monitored telemetrically in the  meantime.  2. Pulmonary nodule: CTA in addition to PE, incidentally revealed a 5 mm pulmonary nodule in the left lower lobe. As patient is an ex-smoker (she quit about 1 year ago), she will require follow up CT scan in 6 months.  3. Hypokalemia: This is mild.We shall replete as indicated, and follow lytes. Check magnesium for completeness.  4. Anemia: CBC finally obtained, and showed HB 5.7, MCV 62.9. On detailed questioning, patient admits that she does have heavy menstrual periods (Just ended on 09/27/12), and has been already scheduled to have further evaluation by GYN. She required a blood transfusion approximately 1 year ago. Will transfuse 3 units PRBC. Anemia panel ordered.   Further management will depend on clinical course.   Comment: Patient is FULL CODE.     Time Spent on Admission: 45 mins.   Guthrie Lemme,CHRISTOPHER 10/04/2012, 4:51 PM

## 2012-10-05 LAB — IRON AND TIBC
Iron: 14 ug/dL — ABNORMAL LOW (ref 42–135)
TIBC: 551 ug/dL — ABNORMAL HIGH (ref 250–470)
UIBC: 537 ug/dL — ABNORMAL HIGH (ref 125–400)

## 2012-10-05 LAB — CBC
Hemoglobin: 9 g/dL — ABNORMAL LOW (ref 12.0–15.0)
MCH: 20.6 pg — ABNORMAL LOW (ref 26.0–34.0)
MCHC: 30.1 g/dL (ref 30.0–36.0)
RDW: 23.7 % — ABNORMAL HIGH (ref 11.5–15.5)

## 2012-10-05 LAB — BASIC METABOLIC PANEL
BUN: 6 mg/dL (ref 6–23)
Calcium: 9.3 mg/dL (ref 8.4–10.5)
GFR calc non Af Amer: >90 mL/min (ref >90)
Glucose, Bld: 175 mg/dL — ABNORMAL HIGH (ref 70–99)
Sodium: 135 mEq/L (ref 135–145)

## 2012-10-05 LAB — LUPUS ANTICOAGULANT PANEL
DRVVT: 27.8 secs (ref <42.9)
PTT Lupus Anticoagulant: 47.4 secs — ABNORMAL HIGH (ref 28.0–43.0)
PTTLA 4:1 Mix: 36.7 secs (ref 28.0–43.0)

## 2012-10-05 LAB — HEPARIN LEVEL (UNFRACTIONATED): Heparin Unfractionated: 0.34 IU/mL (ref 0.30–0.70)

## 2012-10-05 LAB — PROTEIN C ACTIVITY: Protein C Activity: 95 % (ref 75–133)

## 2012-10-05 LAB — BETA-2-GLYCOPROTEIN I ABS, IGG/M/A: Beta-2-Glycoprotein I IgM: 13 M Units (ref <20)

## 2012-10-05 LAB — FERRITIN: Ferritin: 3 ng/mL — ABNORMAL LOW (ref 10–291)

## 2012-10-05 LAB — PROTIME-INR: Prothrombin Time: 14.1 seconds (ref 11.6–15.2)

## 2012-10-05 LAB — CARDIOLIPIN ANTIBODIES, IGG, IGM, IGA
Anticardiolipin IgA: 13 APL U/mL — ABNORMAL LOW (ref <22)
Anticardiolipin IgG: 27 GPL U/mL — ABNORMAL HIGH (ref <23)

## 2012-10-05 LAB — FACTOR 5 LEIDEN

## 2012-10-05 LAB — PROTHROMBIN GENE MUTATION

## 2012-10-05 MED ORDER — MAGNESIUM OXIDE 400 (241.3 MG) MG PO TABS
200.0000 mg | ORAL_TABLET | Freq: Two times a day (BID) | ORAL | Status: DC
  Administered 2012-10-05 – 2012-10-07 (×5): 200 mg via ORAL
  Filled 2012-10-05 (×6): qty 0.5

## 2012-10-05 MED ORDER — WARFARIN SODIUM 10 MG PO TABS
10.0000 mg | ORAL_TABLET | Freq: Once | ORAL | Status: AC
  Administered 2012-10-05: 10 mg via ORAL
  Filled 2012-10-05: qty 1

## 2012-10-05 MED ORDER — FERROUS SULFATE 325 (65 FE) MG PO TABS
325.0000 mg | ORAL_TABLET | Freq: Three times a day (TID) | ORAL | Status: DC
  Administered 2012-10-05 – 2012-10-07 (×6): 325 mg via ORAL
  Filled 2012-10-05 (×8): qty 1

## 2012-10-05 MED ORDER — POTASSIUM CHLORIDE CRYS ER 20 MEQ PO TBCR
40.0000 meq | EXTENDED_RELEASE_TABLET | Freq: Once | ORAL | Status: AC
  Administered 2012-10-05: 40 meq via ORAL
  Filled 2012-10-05: qty 2

## 2012-10-05 NOTE — Care Management Note (Addendum)
    Page 1 of 1   10/07/2012     11:40:33 AM   CARE MANAGEMENT NOTE 10/07/2012  Patient:  LIZANN, EDELMAN   Account Number:  [id number]  Date Initiated:  10/05/2012  Documentation initiated by:  Dessa Phi  Subjective/Objective Assessment:   ADMITTED W/VTE.     Action/Plan:   FROM HOME.PCP-[facility].WORKS.HAS PHARMACY.   Anticipated DC Date:  10/07/2012   Anticipated DC Plan:  [place]  CM consult  [facility]      Choice offered to / List presented to:             Status of service:  Completed, signed off Medicare Important Message given?   (If response is "NO", the following Medicare IM given date fields will be blank) Date Medicare IM given:   Date Additional Medicare IM given:    Discharge Disposition:  HOME/SELF CARE  Per UR Regulation:  Reviewed for med. necessity/level of care/duration of stay  If discussed at Long Length of Stay Meetings, dates discussed:    Comments:  10/07/12 Demoni Parmar RN,BSN NCM [place] [facility](OTC). POLICY KGOVPCHEK:3TCYE/18HTMBPJ/PE NARCOTICS,NO REFILLS/$3 CO PAY(PATIENT ABLE TO PAY),USE W/IN 7 DAYS.FAXED SCRIPTS TO [facility] W/CONFIRMATION.PATIENT ABLE TO GO T[facility] OTPT PHARMACY TO GET MEDS.INR CHECK APPT MADE FOR [facility] ON MONDAY _0 .  10/05/12 Tationa Stech RN,BSN NCM [phone] WILL FOLLOW IF MATCH PROGRAM APPROPRIATE.PATIENT IS AWARE OF POLICY-1 TIME USE IN 16KOECXF/QH NARCOTICS/NO REFILLS/$3 CO PAY.

## 2012-10-05 NOTE — Progress Notes (Signed)
ANTICOAGULATION CONSULT NOTE - Follow Up Consult  Pharmacy Consult for Heparin Indication: pulmonary embolus and DVT  Labs:  Recent Labs  10/04/12 1414  10/04/12 1651 10/04/12 1825 10/05/12 0854 10/05/12 1459  HGB  --   < > 5.7* 5.7* 9.0*  --   HCT  --   --  21.2* 21.7* 29.9*  --   PLT  --   --  192 198 169  --   LABPROT  --   --   --  14.4 14.1  --   INR  --   --   --  1.14 1.11  --   HEPARINUNFRC  --   --   --   --  0.48 0.34  CREATININE 0.72  --   --   --  0.74  --   < > = values in this interval not displayed.  Estimated Creatinine Clearance: 105.3 ml/min (by C-G formula based on Cr of 0.74).   Assessment: 52 yoF with DVT and PE.  Day #2 overlap with warfarin/heparin.  Repeat heparin level therapeutic with rate at 1300 units/hr. No bleeding/complications reported.  Goal of Therapy:  Heparin level 0.3-0.7 units/ml Monitor platelets by anticoagulation protocol: Yes   Plan:  1.  Continue heparin infusion at 1300 units/hr (13 ml/hr). 2.  F/u heparin level in AM.  Hershal Coria 10/05/2012,3:50 PM

## 2012-10-05 NOTE — Progress Notes (Signed)
[facility] PROGRESS NOTE  [name] POE:[id number] DOB: January 29, 1966 DOA: 10/04/2012 PCP: No primary provider on file.  Assessment/Plan: 1. VTE (venous thromboembolism): CTA demonstrated bilateral PE. LE venous doppler confirmed LLE DVT involving the popliteal vein and posterior tibial veins. -Continue with Heparin Gtt and Coumadin.  -If Hb stable 7-10 will transition to Lovenox.  _Hypercoagulable panel pending.   2. Pulmonary nodule: CTA incidentally revealed a 5 mm pulmonary nodule in the left lower lobe. As patient is an ex-smoker (she quit about 1 year ago), she will require follow up CT scan in 6 months.  -Patient aware of results and importance of follow up.   3. Hypokalemia: Replete with 40 meq times one. Mg at 1.7. Will replete mg with oral supplement.   4. Anemia: Iron deficiency anemia. HB 5.7, MCV 62.9. she does have heavy menstrual periods (Just ended on 09/27/12). Patient will follow up with GYN.  Patient S/P 3 units PRBC transfusion 7-8. Iron at 14, ferritin at 3, B 12 649. Will start ferrous sulfate.    Code Status: Full  Family Communication: Care discussed with patient.  Disposition Plan: Home in 24 to 48 hours.    Consultants:  none  Procedures:  none  Antibiotics:  none  HPI/Subjective: Feeling well, no chest pain. No significant SOB.   Objective: Filed Vitals:   10/05/12 0402 10/05/12 0438 10/05/12 0538 10/05/12 0638  BP: 140/90 138/88 140/90 136/98  Pulse: 78 78 81 87  Temp: 98.5 F (36.9 C) 98.8 F (37.1 C) 98.3 F (36.8 C) 98.6 F (37 C)  TempSrc: Oral Oral Oral Oral  Resp: _0 Height:      Weight:      SpO2: 100% 99% 100% 97%    Intake/Output Summary (Last 24 hours) at 10/05/12 1213 Last data filed at 10/05/12 0800  Gross per 24 hour  Intake 1162.5 ml  Output   3200 ml  Net -2037.5 ml   Filed Weights   10/04/12 1448  Weight: 104.327 kg (230 lb)    Exam:   General:  No distress.   Cardiovascular: S 1, S 2  RRR  Respiratory: CTA  Abdomen: BS present, soft, NT  Musculoskeletal: left LE with edema.   Data Reviewed: Basic Metabolic Panel:  Recent Labs Lab 10/04/12 1414 10/04/12 1825 10/05/12 0854  NA 136  --  135  K 3.3*  --  3.4*  CL 101  --  99  CO2 23  --  24  GLUCOSE 106*  --  175*  BUN 7  --  6  CREATININE 0.72  --  0.74  CALCIUM 9.1  --  9.3  MG  --  1.7 1.7   Liver Function Tests: No results found for this basename: AST, ALT, ALKPHOS, BILITOT, PROT, ALBUMIN,  in the last 168 hours No results found for this basename: LIPASE, AMYLASE,  in the last 168 hours No results found for this basename: AMMONIA,  in the last 168 hours CBC:  Recent Labs Lab 10/04/12 1651 10/04/12 1825 10/05/12 0854  WBC 8.3 7.7 8.8  NEUTROABS 5.6  --   --   HGB 5.7* 5.7* 9.0*  HCT 21.2* 21.7* 29.9*  MCV 62.9* 63.3* 68.6*  PLT 192 198 169   Cardiac Enzymes: No results found for this basename: CKTOTAL, CKMB, CKMBINDEX, TROPONINI,  in the last 168 hours BNP (last 3 results) No results found for this basename: PROBNP,  in the last 8760 hours CBG: No results found for this  basename: GLUCAP,  in the last 168 hours  No results found for this or any previous visit (from the past 240 hour(s)).   Studies: Dg Chest 2 View  10/04/2012   *RADIOLOGY REPORT*  Clinical Data: Shortness of breath, history of bronchitis  CHEST - 2 VIEW  Comparison: 04/28/2011  Findings: Borderline cardiomegaly.  No acute infiltrate or pleural effusion.  No pulmonary edema. Bony thorax is stable.  IMPRESSION: No active disease.  Borderline cardiomegaly.   Original Report Authenticated By: Lahoma Crocker, M.D.   Ct Angio Chest W/cm &/or Wo Cm  10/04/2012   *RADIOLOGY REPORT*  Clinical Data: Shortness of breath and DVT  CT ANGIOGRAPHY CHEST  Technique:  Multidetector CT imaging of the chest using the standard protocol during bolus administration of intravenous contrast. Multiplanar reconstructed images including MIPs were obtained and  reviewed to evaluate the vascular anatomy.  Contrast: 166m OMNIPAQUE IOHEXOL 350 MG/ML SOLN  Comparison: None.  Findings: There is no pleural effusion.  Pulmonary nodule in the left lower lobe measures 5 mm, image 40/series 12.  There is no airspace consolidation or atelectasis identified.  The heart size is normal.  No pericardial effusion.  There is no mediastinal or hilar adenopathy.  There are bilateral pulmonary artery filling defects.  These predominately involve the lower lobe pulmonary arteries.  Limited imaging through the upper abdomen is unremarkable.  Review of the visualized osseous structures is unremarkable.  IMPRESSION:  1.  Exam is positive for bilateral pulmonary emboli. 2.  Pulmonary nodule in the left lower lobe measures 5 mm. If the patient is at high risk for bronchogenic carcinoma, follow-up chest CT at 6-12 months is recommended.  If the patient is at low risk for bronchogenic carcinoma, follow-up chest CT at 12 months is recommended.  This recommendation follows the consensus statement: Guidelines for Management of Small Pulmonary Nodules Detected on CT Scans: A Statement from the [place] published in Radiology 2005; 237:395-400.   Original Report Authenticated By: TKerby Moors M.D.    Scheduled Meds: . sodium chloride  3 mL Intravenous Q12H  . warfarin  10 mg Oral ONCE-1800  . Warfarin - Pharmacist Dosing Inpatient   Does not apply q1800   Continuous Infusions: . heparin 1,300 Units/hr (10/05/12 1127)    Active Problems:   VTE (venous thromboembolism)   Pulmonary nodule   Hypokalemia    Time spent: 35 minutes.     [name]  [facility] Pager [phone] If 7PM-7AM, please contact night-coverage at [email or web address], password [email or web address]. [email or web address] Owen7/11/2012, 12:13 PM  LOS: 1 day

## 2012-10-05 NOTE — Progress Notes (Signed)
ANTICOAGULATION CONSULT NOTE - Follow Up  Pharmacy Consult for Heparin/Warfarin Indication: pulmonary embolus and DVT  Allergies  Allergen Reactions  . Ampicillin Other (See Comments)    Severe abdominal pain, dizziness (penicillin is okay)  . Sulfa Antibiotics Other (See Comments)    Very bad yeast infection    Patient Measurements: Height: _0  (165.1 cm) Weight: 230 lb (104.327 kg) IBW/kg (Calculated) : 57 Heparin Dosing Weight: 81 kg  Labs:  Recent Labs  10/04/12 1414  10/04/12 1651 10/04/12 1825 10/05/12 0854  HGB  --   < > 5.7* 5.7* 9.0*  HCT  --   --  21.2* 21.7* 29.9*  PLT  --   --  192 198 169  LABPROT  --   --   --  14.4 14.1  INR  --   --   --  1.14 1.11  HEPARINUNFRC  --   --   --   --  0.48  CREATININE 0.72  --   --   --  0.74  < > = values in this interval not displayed.  Estimated Creatinine Clearance: 105.3 ml/min (by C-G formula based on Cr of 0.74).   Medical History: Past Medical History  Diagnosis Date  . Medical history non-contributory   . Anemia     Assessment: 47 yo F presents 7/8 with DVT and PE confirmed with LE venous doppler and CTA.  No predispositions to VTE other than prolonged standing at work. (quit smoking ~1 yr ago) Received 1 dose of treatment dose Lovenox (1 mg/kg) in ED at 1500 today.  Admitting MD switching to IV heparin and warfarin per pharmacy.  Baseline labs showed Hgb 5.7.  MD ordered 3 units of PRBC.  Today is Day #2 overlap with warfarin/heparin  1st heparin level at 9am is therapeutic (delayed in obtaining HL due to blood administration)  INR is subtherapeutic as expected after only one dose of warfarin  Hgb improved to 9.0 this am. Plts wnl.  No bleeding events reported in chart notes.  Goal of Therapy:  INR 2-3 Heparin level 0.3-0.7 units/ml Monitor platelets by anticoagulation protocol: Yes   Plan:  1.  Continue heparin at 1300 units/hr (13 ml/hr) 2.  Repeat warfarin 10 mg po x1 tonight 3.  Recheck  heparin level in 6 hours to confirm dose (15:00) 4.  Daily PT/INR, HL, and CBC 5.  Warfarin education to occur prior to discharge  Verdia Kuba, PharmD Pager: [phone] 10/05/2012,10:28 AM

## 2012-10-06 LAB — URINALYSIS, ROUTINE W REFLEX MICROSCOPIC
Ketones, ur: NEGATIVE mg/dL
Nitrite: NEGATIVE
Protein, ur: 30 mg/dL — AB

## 2012-10-06 LAB — TYPE AND SCREEN
ABO/RH(D): O POS
Unit division: 0

## 2012-10-06 LAB — BASIC METABOLIC PANEL
Calcium: 8.7 mg/dL (ref 8.4–10.5)
Chloride: 102 mEq/L (ref 96–112)
Creatinine, Ser: 0.79 mg/dL (ref 0.50–1.10)
GFR calc Af Amer: >90 mL/min (ref >90)

## 2012-10-06 LAB — URINE MICROSCOPIC-ADD ON

## 2012-10-06 LAB — HEPARIN LEVEL (UNFRACTIONATED): Heparin Unfractionated: 0.23 IU/mL — ABNORMAL LOW (ref 0.30–0.70)

## 2012-10-06 LAB — CBC
Hemoglobin: 8.5 g/dL — ABNORMAL LOW (ref 12.0–15.0)
MCHC: 29.1 g/dL — ABNORMAL LOW (ref 30.0–36.0)
RDW: 23.6 % — ABNORMAL HIGH (ref 11.5–15.5)

## 2012-10-06 LAB — PROTIME-INR
INR: 1.12 (ref 0.00–1.49)
Prothrombin Time: 14.2 seconds (ref 11.6–15.2)

## 2012-10-06 MED ORDER — HEPARIN (PORCINE) IN NACL 100-0.45 UNIT/ML-% IJ SOLN
1450.0000 [IU]/h | INTRAMUSCULAR | Status: AC
  Administered 2012-10-06: 1450 [IU]/h via INTRAVENOUS
  Filled 2012-10-06: qty 250

## 2012-10-06 MED ORDER — WARFARIN SODIUM 10 MG PO TABS
10.0000 mg | ORAL_TABLET | Freq: Once | ORAL | Status: AC
  Administered 2012-10-06: 10 mg via ORAL
  Filled 2012-10-06: qty 1

## 2012-10-06 MED ORDER — ENOXAPARIN SODIUM 100 MG/ML ~~LOC~~ SOLN
100.0000 mg | Freq: Two times a day (BID) | SUBCUTANEOUS | Status: DC
  Administered 2012-10-06 – 2012-10-07 (×3): 100 mg via SUBCUTANEOUS
  Filled 2012-10-06 (×4): qty 1

## 2012-10-06 NOTE — Progress Notes (Signed)
Visited with patient today. Offered support. Listened, offered prayer for continuing recovery and peace and calm.

## 2012-10-06 NOTE — Progress Notes (Signed)
[facility] PROGRESS NOTE  [name] TDH:[id number] DOB: 04/13/1965 DOA: 10/04/2012 PCP: No primary provider on file.  Assessment/Plan: 1. VTE (venous thromboembolism): CTA demonstrated bilateral PE. LE venous doppler confirmed LLE DVT involving the popliteal vein and posterior tibial veins. -Continue Coumadin.  -Transition heparin to coumadin.  -Day 3 anticoagulation bridge.  -Hypercoagulable panel: Anti thrombin 111 at 98, protein C 95, protein C total low at 58, Protein S at 108, PTT lupus anticoagulant at 47, Lupus anticoagulant not detected, B 2 negative, negative for factor V mutation, negative for prothrombin gene II mutation, anticordiolipine IgG 27.   2. Pulmonary nodule: CTA incidentally revealed a 5 mm pulmonary nodule in the left lower lobe. As patient is an ex-smoker (she quit about 1 year ago), she will require follow up CT scan in 6 months.  -Patient aware of results and importance of follow up.   3. Hypokalemia:Resolved.   4. Anemia: Iron deficiency anemia. HB 5.7, MCV 62.9. she does have heavy menstrual periods (Just ended on 09/27/12). Patient will follow up with GYN.  Patient S/P 3 units PRBC transfusion 7-8. Iron at 14, ferritin at 3, B 12 649. Continue with  ferrous sulfate. Repeat Hb in am, if stable will consider discharge patient.    Code Status: Full  Family Communication: Care discussed with patient.  Disposition Plan: Home in 24 to 48 hours.    Consultants:  none  Procedures:  none  Antibiotics:  none  HPI/Subjective: Feeling well, no chest pain. No significant SOB.   Objective: Filed Vitals:   10/05/12 0638 10/05/12 1335 10/05/12 2038 10/06/12 0529  BP: 136/98 139/81 149/83 138/86  Pulse: 87 88 84 80  Temp: 98.6 F (37 C) 98.1 F (36.7 C) 98.7 F (37.1 C) 98 F (36.7 C)  TempSrc: Oral Oral Oral Oral  Resp: _0 Height:      Weight:      SpO2: 97% 98% 98% 96%    Intake/Output Summary (Last 24 hours) at 10/06/12  1207 Last data filed at 10/06/12 0945  Gross per 24 hour  Intake 735.58 ml  Output   1000 ml  Net -264.42 ml   Filed Weights   10/04/12 1448  Weight: 104.327 kg (230 lb)    Exam:   General:  No distress.   Cardiovascular: S 1, S 2 RRR  Respiratory: CTA  Abdomen: BS present, soft, NT  Musculoskeletal: left LE with edema.   Data Reviewed: Basic Metabolic Panel:  Recent Labs Lab 10/04/12 1414 10/04/12 1825 10/05/12 0854 10/06/12 0447  NA 136  --  135 136  K 3.3*  --  3.4* 4.0  CL 101  --  99 102  CO2 23  --  24 27  GLUCOSE 106*  --  175* 124*  BUN 7  --  6 6  CREATININE 0.72  --  0.74 0.79  CALCIUM 9.1  --  9.3 8.7  MG  --  1.7 1.7  --    Liver Function Tests: No results found for this basename: AST, ALT, ALKPHOS, BILITOT, PROT, ALBUMIN,  in the last 168 hours No results found for this basename: LIPASE, AMYLASE,  in the last 168 hours No results found for this basename: AMMONIA,  in the last 168 hours CBC:  Recent Labs Lab 10/04/12 1651 10/04/12 1825 10/05/12 0854 10/06/12 0447  WBC 8.3 7.7 8.8 9.7  NEUTROABS 5.6  --   --   --   HGB 5.7* 5.7* 9.0* 8.5*  HCT  21.2* 21.7* 29.9* 29.2*  MCV 62.9* 63.3* 68.6* 69.5*  PLT 192 198 169 212   Cardiac Enzymes: No results found for this basename: CKTOTAL, CKMB, CKMBINDEX, TROPONINI,  in the last 168 hours BNP (last 3 results) No results found for this basename: PROBNP,  in the last 8760 hours CBG: No results found for this basename: GLUCAP,  in the last 168 hours  No results found for this or any previous visit (from the past 240 hour(s)).   Studies: Dg Chest 2 View  10/04/2012   *RADIOLOGY REPORT*  Clinical Data: Shortness of breath, history of bronchitis  CHEST - 2 VIEW  Comparison: 04/28/2011  Findings: Borderline cardiomegaly.  No acute infiltrate or pleural effusion.  No pulmonary edema. Bony thorax is stable.  IMPRESSION: No active disease.  Borderline cardiomegaly.   Original Report Authenticated By:  Lahoma Crocker, M.D.   Ct Angio Chest W/cm &/or Wo Cm  10/04/2012   *RADIOLOGY REPORT*  Clinical Data: Shortness of breath and DVT  CT ANGIOGRAPHY CHEST  Technique:  Multidetector CT imaging of the chest using the standard protocol during bolus administration of intravenous contrast. Multiplanar reconstructed images including MIPs were obtained and reviewed to evaluate the vascular anatomy.  Contrast: 146m OMNIPAQUE IOHEXOL 350 MG/ML SOLN  Comparison: None.  Findings: There is no pleural effusion.  Pulmonary nodule in the left lower lobe measures 5 mm, image 40/series 12.  There is no airspace consolidation or atelectasis identified.  The heart size is normal.  No pericardial effusion.  There is no mediastinal or hilar adenopathy.  There are bilateral pulmonary artery filling defects.  These predominately involve the lower lobe pulmonary arteries.  Limited imaging through the upper abdomen is unremarkable.  Review of the visualized osseous structures is unremarkable.  IMPRESSION:  1.  Exam is positive for bilateral pulmonary emboli. 2.  Pulmonary nodule in the left lower lobe measures 5 mm. If the patient is at high risk for bronchogenic carcinoma, follow-up chest CT at 6-12 months is recommended.  If the patient is at low risk for bronchogenic carcinoma, follow-up chest CT at 12 months is recommended.  This recommendation follows the consensus statement: Guidelines for Management of Small Pulmonary Nodules Detected on CT Scans: A Statement from the [employer] published in Radiology 2005; 237:395-400.   Original Report Authenticated By: TKerby Moors M.D.    Scheduled Meds: . enoxaparin (LOVENOX) injection  100 mg Subcutaneous Q12H  . ferrous sulfate  325 mg Oral TID WC  . magnesium oxide  200 mg Oral BID  . sodium chloride  3 mL Intravenous Q12H  . warfarin  10 mg Oral ONCE-1800  . Warfarin - Pharmacist Dosing Inpatient   Does not apply q1800   Continuous Infusions:    Active Problems:    VTE (venous thromboembolism)   Pulmonary nodule   Hypokalemia    Time spent: 35 minutes.     Ronda Kazmi  [facility] Pager [phone] If 7PM-7AM, please contact night-coverage at [email or web address], password [email or web address]7/12/2012, 12:07 PM  LOS: 2 days

## 2012-10-06 NOTE — Progress Notes (Signed)
ANTICOAGULATION CONSULT NOTE - Follow Up Consult  Pharmacy Consult for Heparin Indication: pulmonary embolus and DVT   Allergies  Allergen Reactions  . Ampicillin Other (See Comments)    Severe abdominal pain, dizziness (penicillin is okay)  . Sulfa Antibiotics Other (See Comments)    Very bad yeast infection    Patient Measurements: Height: _0  (165.1 cm) Weight: 230 lb (104.327 kg) IBW/kg (Calculated) : 57 Heparin Dosing Weight:   Vital Signs: Temp: 98 F (36.7 C) (07/10 0529) Temp src: Oral (07/10 0529) BP: 138/86 mmHg (07/10 0529) Pulse Rate: 80 (07/10 0529)  Labs:  Recent Labs  10/04/12 1414  10/04/12 1825 10/05/12 0854 10/05/12 1459 10/06/12 0447  HGB  --   < > 5.7* 9.0*  --  8.5*  HCT  --   < > 21.7* 29.9*  --  29.2*  PLT  --   < > 198 169  --  212  LABPROT  --   --  14.4 14.1  --  14.2  INR  --   --  1.14 1.11  --  1.12  HEPARINUNFRC  --   --   --  0.48 0.34 0.23*  CREATININE 0.72  --   --  0.74  --   --   < > = values in this interval not displayed.  Estimated Creatinine Clearance: 105.3 ml/min (by C-G formula based on Cr of 0.74).   Medications:  Infusions:  . heparin      Assessment: Patient with low heparin level.  No issues per RN.  Goal of Therapy:  Heparin level 0.3-0.7 units/ml Monitor platelets by anticoagulation protocol: Yes   Plan:  Increase heparin to 1450 units/hr, recheck level at 1200.  Tyler Deis, Shea Stakes Crowford 10/06/2012,5:31 AM

## 2012-10-06 NOTE — Progress Notes (Addendum)
ANTICOAGULATION CONSULT NOTE - Follow Up  Pharmacy Consult for Warfarin, Transition from IV Heparin to SQ Lovenox Indication: pulmonary embolus and DVT  Allergies  Allergen Reactions  . Ampicillin Other (See Comments)    Severe abdominal pain, dizziness (penicillin is okay)  . Sulfa Antibiotics Other (See Comments)    Very bad yeast infection    Patient Measurements: Height: _0  (165.1 cm) Weight: 230 lb (104.327 kg) IBW/kg (Calculated) : 57 Heparin Dosing Weight: 81 kg  Labs:  Recent Labs  10/04/12 1414  10/04/12 1825 10/05/12 0854 10/05/12 1459 10/06/12 0447  HGB  --   < > 5.7* 9.0*  --  8.5*  HCT  --   < > 21.7* 29.9*  --  29.2*  PLT  --   < > 198 169  --  212  LABPROT  --   --  14.4 14.1  --  14.2  INR  --   --  1.14 1.11  --  1.12  HEPARINUNFRC  --   --   --  0.48 0.34 0.23*  CREATININE 0.72  --   --  0.74  --  0.79  < > = values in this interval not displayed.  Estimated Creatinine Clearance: 105.3 ml/min (by C-G formula based on Cr of 0.79).   Assessment: 47 yo F presents 10/04/12 with DVT and PE confirmed with LE venous doppler and CTA.  No predispositions to VTE other than prolonged standing at work. Patient quit smoking ~1 yr ago. Received 1 dose of treatment dose Lovenox (1 mg/kg) in ED on 7/8.  Admitting MD switching to IV heparin and warfarin per pharmacy.  Baseline labs showed Hgb 5.7.  MD ordered 3 units of PRBC.  Today is Day #3 overlap with warfarin/heparin  Order to change IV heparin to SQ Lovenox. Will stop heparin at 9am, wait 1 hour, then begin Lovenox 43m/kg SQ q12h at 10am. Due to Hgb of 537 on admit (8.5 today), will begin with q12h Lovenox dosing instead of larger q24h dosing.  INR is subtherapeutic as expected after only two doses of warfarin  No bleeding events reported in chart notes.  Goal of Therapy:  INR 2-3 Heparin level 0.3-0.7 units/ml Monitor platelets by anticoagulation protocol: Yes   Plan:  1.  Stop heparin drip at 9am 2.   At 10am, start Lovenox 148mkg q12h (10080mq q12h) 3.  Repeat warfarin 10 mg po x1 tonight 4.  D/C daily HL 5.  Warfarin education to occur prior to discharge  JesVerdia KubaharmD Pager: [phone]/2014,8:42 AM  ADDENDUM: Standard warfarin education completed. Patient very engaged in discussion. All questions answered; patient has no further questions at this time.  JesVerdia KubaharmD Pager: [phone]/2014 3:16 PM

## 2012-10-07 LAB — CBC
Hemoglobin: 9 g/dL — ABNORMAL LOW (ref 12.0–15.0)
MCH: 20 pg — ABNORMAL LOW (ref 26.0–34.0)
MCHC: 28.5 g/dL — ABNORMAL LOW (ref 30.0–36.0)
Platelets: 235 10*3/uL (ref 150–400)

## 2012-10-07 LAB — PROTIME-INR: Prothrombin Time: 14.9 seconds (ref 11.6–15.2)

## 2012-10-07 MED ORDER — FERROUS SULFATE 325 (65 FE) MG PO TABS: 325.0000 mg | ORAL_TABLET | Freq: Three times a day (TID) | ORAL | Status: DC

## 2012-10-07 MED ORDER — WARFARIN SODIUM 7.5 MG PO TABS
15.0000 mg | ORAL_TABLET | Freq: Once | ORAL | Status: AC
  Administered 2012-10-07: 15 mg via ORAL
  Filled 2012-10-07: qty 2

## 2012-10-07 MED ORDER — ENOXAPARIN SODIUM 100 MG/ML ~~LOC~~ SOLN: 100.0000 mg | Freq: Two times a day (BID) | SUBCUTANEOUS | Status: DC

## 2012-10-07 MED ORDER — WARFARIN SODIUM 5 MG PO TABS: 10.0000 mg | ORAL_TABLET | Freq: Every day | ORAL | Status: DC

## 2012-10-07 NOTE — Discharge Summary (Signed)
Physician Discharge Summary  [name] YOM:[id number] DOB: 09-29-1965 DOA: 10/04/2012  PCP: No primary provider on file.  Admit date: 10/04/2012 Discharge date: 10/07/2012  Time spent: 35  minutes  Recommendations for Outpatient Follow-up:  1. Need INR check. 2. Need referral to hematologist. 3. Need repeat Hypercoagulable panel.   Discharge Diagnoses:    VTE (venous thromboembolism)   Pulmonary nodule   Hypokalemia   Discharge Condition: stable.   Diet recommendation: Regular diet.   Filed Weights   10/04/12 1448  Weight: 104.327 kg (230 lb)    History of present illness:  This is a 47 year old female with history of uterine myomectomy approximately 7 years ago, ex-smoker, quit 1 year ago, but otherwise, no significant medical history. She is a CNA, and also works as a Geophysicist/field seismologist, having to stand for prolonged periods. According to her, she felt increasing left leg discomfort and cramping on 10/02/12, which became progressively worse, particularly on ambulation, causing her to limp occasionallyl. Later the same day, she became short of breath, and developed a dry cough, which caused her to feel sore in bilateral lower rib areas. SOB has progressed, and she can now ambulate only a few feet, before having to stop to rest. She has also become progressively fatigued. As symptoms have persisted, she drove to the ED today. D-Dimer was found to be elevated at 5.89, LE venous doppler demonstrated DVT involving the popliteal vein and posterior tibial veins of the left leg, and CTA showed bilateral pulmonary emboli. Patient has no personal or family history of VTE, is not on contraceptive medication, and has no history of recent long-distance travel.   Hospital Course:   1. VTE (venous thromboembolism): Patient presents with left LE cramps, swelling. LE venous doppler confirmed LLE DVT involving the popliteal vein and posterior tibial veins. CTA demonstrated bilateral PE.  -Continue  Coumadin, will give 15 mg today prior to discharge. Will discharge on 10 mg daily. Need INR check on Monday.   -Day 4 anticoagulation bridge. INR still at 1,2. Continue with Lovenox until and for 24 hours after INR at goal.  -Hypercoagulable panel: Anti thrombin 111 at 98, protein C 95, protein C total low at 58, Protein S at 108, PTT lupus anticoagulant at 47, Lupus anticoagulant not detected, B 2 negative, negative for factor V mutation, negative for prothrombin gene II mutation, anticordiolipine IgG 27.  Non specific finding. She will need repeat hypercoagulable panel when off anticoagulation. She will need to follow up with hematologist to determine length of treatment.  2. Pulmonary nodule: CTA incidentally revealed a 5 mm pulmonary nodule in the left lower lobe. As patient is an ex-smoker (she quit about 1 year ago), she will require follow up CT scan in 6 months.  -Patient aware of results and importance of follow up.  3. Hypokalemia: Resolved.  4. Anemia: Iron deficiency anemia. HB 5.7, MCV 62.9. she does have heavy menstrual periods (Just ended on 09/27/12). Patient will follow up with GYN. Patient S/P 3 units PRBC transfusion 7-8. Iron at 14, ferritin at 3, B 12 649. Continue with ferrous sulfate. Hb stable at 9.   Procedures: Left LE Venous doppler: Findings consistent with deep vein thrombosis involving the left popliteal vein and left posterial tibial vein.   Consultations:  None  Discharge Exam: Filed Vitals:   10/06/12 0529 10/06/12 1300 10/06/12 2241 10/07/12 0543  BP: 138/86 129/79 126/70 145/86  Pulse: 80 81 84 87  Temp: 98 F (36.7 C) 98.1 F (36.7  C) 98.4 F (36.9 C) 98.2 F (36.8 C)  TempSrc: Oral Oral Oral Oral  Resp: _0 Height:      Weight:      SpO2: 96% 98% 97% 97%    General: No distress.  Cardiovascular: S 1, S 2 RRR Respiratory: CTA  Discharge Instructions  Discharge Orders   Future Appointments Provider Department Dept Phone   10/26/2012  2:15 PM Osborne Oman, MD [facility] [phone]   Future Orders Complete By Expires     Diet general  As directed     Increase activity slowly  As directed         Medication List    STOP taking these medications       aspirin 325 MG tablet     ibuprofen 200 MG tablet  Commonly known as:  ADVIL,MOTRIN      TAKE these medications       enoxaparin 100 MG/ML injection  Commonly known as:  LOVENOX  Inject 1 mL (100 mg total) into the skin every 12 (twelve) hours.     ferrous sulfate 325 (65 FE) MG tablet  Take 1 tablet (325 mg total) by mouth 3 (three) times daily with meals.     warfarin 5 MG tablet  Commonly known as:  COUMADIN  Take 2 tablets (10 mg total) by mouth daily.       Allergies  Allergen Reactions  . Ampicillin Other (See Comments)    Severe abdominal pain, dizziness (penicillin is okay)  . Sulfa Antibiotics Other (See Comments)    Very bad yeast infection       Follow-up Information   Please follow up. (Follow up with PCP on Monday for INR check. )        The results of significant diagnostics from this hospitalization (including imaging, microbiology, ancillary and laboratory) are listed below for reference.    Significant Diagnostic Studies: Dg Chest 2 View  10/04/2012   *RADIOLOGY REPORT*  Clinical Data: Shortness of breath, history of bronchitis  CHEST - 2 VIEW  Comparison: 04/28/2011  Findings: Borderline cardiomegaly.  No acute infiltrate or pleural effusion.  No pulmonary edema. Bony thorax is stable.  IMPRESSION: No active disease.  Borderline cardiomegaly.   Original Report Authenticated By: Lahoma Crocker, M.D.   Ct Angio Chest W/cm &/or Wo Cm  10/04/2012   *RADIOLOGY REPORT*  Clinical Data: Shortness of breath and DVT  CT ANGIOGRAPHY CHEST  Technique:  Multidetector CT imaging of the chest using the standard protocol during bolus administration of intravenous contrast. Multiplanar reconstructed images including MIPs were obtained  and reviewed to evaluate the vascular anatomy.  Contrast: 162m OMNIPAQUE IOHEXOL 350 MG/ML SOLN  Comparison: None.  Findings: There is no pleural effusion.  Pulmonary nodule in the left lower lobe measures 5 mm, image 40/series 12.  There is no airspace consolidation or atelectasis identified.  The heart size is normal.  No pericardial effusion.  There is no mediastinal or hilar adenopathy.  There are bilateral pulmonary artery filling defects.  These predominately involve the lower lobe pulmonary arteries.  Limited imaging through the upper abdomen is unremarkable.  Review of the visualized osseous structures is unremarkable.  IMPRESSION:  1.  Exam is positive for bilateral pulmonary emboli. 2.  Pulmonary nodule in the left lower lobe measures 5 mm. If the patient is at high risk for bronchogenic carcinoma, follow-up chest CT at 6-12 months is recommended.  If the patient is at low risk  for bronchogenic carcinoma, follow-up chest CT at 12 months is recommended.  This recommendation follows the consensus statement: Guidelines for Management of Small Pulmonary Nodules Detected on CT Scans: A Statement from the [place] as published in Radiology 2005; 237:395-400.   Original Report Authenticated By: Kerby Moors, M.D.    Microbiology: Recent Results (from the past 240 hour(s))  URINE CULTURE     Status: None   Collection Time    10/06/12  9:40 AM      Result Value Range Status   Specimen Description URINE, CLEAN CATCH   Final   Special Requests NONE   Final   Culture  Setup Time 10/06/2012 13:42   Final   Colony Count >=100,000 COLONIES/ML   Final   Culture GRAM NEGATIVE RODS   Final   Report Status PENDING   Incomplete     Labs: Basic Metabolic Panel:  Recent Labs Lab 10/04/12 1414 10/04/12 1825 10/05/12 0854 10/06/12 0447  NA 136  --  135 136  K 3.3*  --  3.4* 4.0  CL 101  --  99 102  CO2 23  --  24 27  GLUCOSE 106*  --  175* 124*  BUN 7  --  6 6  CREATININE 0.72  --  0.74  0.79  CALCIUM 9.1  --  9.3 8.7  MG  --  1.7 1.7  --    Liver Function Tests: No results found for this basename: AST, ALT, ALKPHOS, BILITOT, PROT, ALBUMIN,  in the last 168 hours No results found for this basename: LIPASE, AMYLASE,  in the last 168 hours No results found for this basename: AMMONIA,  in the last 168 hours CBC:  Recent Labs Lab 10/04/12 1651 10/04/12 1825 10/05/12 0854 10/06/12 0447 10/07/12 0407  WBC 8.3 7.7 8.8 9.7 9.0  NEUTROABS 5.6  --   --   --   --   HGB 5.7* 5.7* 9.0* 8.5* 9.0*  HCT 21.2* 21.7* 29.9* 29.2* 31.6*  MCV 62.9* 63.3* 68.6* 69.5* 70.4*  PLT 192 198 169 212 235   Cardiac Enzymes: No results found for this basename: CKTOTAL, CKMB, CKMBINDEX, TROPONINI,  in the last 168 hours BNP: BNP (last 3 results) No results found for this basename: PROBNP,  in the last 8760 hours CBG: No results found for this basename: GLUCAP,  in the last 168 hours     Signed:  Mirtha Jain  [facility] 10/07/2012, 9:03 AM

## 2012-10-07 NOTE — Progress Notes (Signed)
ANTICOAGULATION CONSULT NOTE - Follow Up  Pharmacy Consult for Warfarin, Lovenox Indication: pulmonary embolus and DVT  Allergies  Allergen Reactions  . Ampicillin Other (See Comments)    Severe abdominal pain, dizziness (penicillin is okay)  . Sulfa Antibiotics Other (See Comments)    Very bad yeast infection    Patient Measurements: Height: _0  (165.1 cm) Weight: 230 lb (104.327 kg) IBW/kg (Calculated) : 57  Labs:  Recent Labs  10/04/12 1414  10/05/12 0854 10/05/12 1459 10/06/12 0447 10/07/12 0407  HGB  --   < > 9.0*  --  8.5* 9.0*  HCT  --   < > 29.9*  --  29.2* 31.6*  PLT  --   < > 169  --  212 235  LABPROT  --   < > 14.1  --  14.2 14.9  INR  --   < > 1.11  --  1.12 1.20  HEPARINUNFRC  --   --  0.48 0.34 0.23*  --   CREATININE 0.72  --  0.74  --  0.79  --   < > = values in this interval not displayed.  Estimated Creatinine Clearance: 105.3 ml/min (by C-G formula based on Cr of 0.79).   Assessment: 47 yo F presents 10/04/12 with DVT and PE confirmed with LE venous doppler and CTA.  No predispositions to VTE other than prolonged standing at work. Patient quit smoking ~1 yr ago. Baseline labs showed Hgb 5.7.  MD ordered 3 units of PRBC.  Today is Day #4 overlap with warfarin/heparin  IV heparin changed to SQ Lovenox 53m/kg q12h yesterday  INR is subtherapeutic. Would have expected more of a response by this morning, so will give a one-time large dose of warfarin tonight  No bleeding events reported in chart notes.  Goal of Therapy:  INR 2-3 Monitor platelets by anticoagulation protocol: Yes   Plan:  1.  Increase warfarin to 117mPO x1 today 2.  Continue Lovenox 10053mQ q12h through tomorrow AND until INR >2 x2 consecutive days per CHEST guidelines recommendations. 3.  Warfarin education completed 7/10 4.  If patient is discharged today, suggest giving 26m13m warfarin prior to discharge today, then sending patient home with a prescription for warfarin 10mg68m daily starting tomorrow (7/12). Suggest next INR check on Monday 7/14. Suggest sending patient home with enough Lovenox to get through to Dr.'s appt/INR check on Monday.  JessiVerdia KubarmD Pager: [phone]/2014,7:44 AM

## 2012-10-07 NOTE — Progress Notes (Signed)
Pt gave herself own Lovenox injection. Good technique. Will cont to monitor.

## 2012-10-08 LAB — URINE CULTURE

## 2012-10-13 ENCOUNTER — Other Ambulatory Visit (HOSPITAL_COMMUNITY)
Admission: RE | Admit: 2012-10-13 | Discharge: 2012-10-13 | Disposition: A | Discharge status: Home / Self Care | Payer: Self-pay | Source: Ambulatory Visit | Attending: Obstetrics & Gynecology | Admitting: Obstetrics & Gynecology

## 2012-10-13 ENCOUNTER — Encounter: Payer: Self-pay | Admitting: *Deleted

## 2012-10-13 ENCOUNTER — Ambulatory Visit (INDEPENDENT_AMBULATORY_CARE_PROVIDER_SITE_OTHER): Payer: Self-pay | Admitting: Obstetrics & Gynecology

## 2012-10-13 ENCOUNTER — Encounter: Payer: Self-pay | Admitting: Obstetrics & Gynecology

## 2012-10-13 VITALS — BP 132/90 | HR 85 | Temp 97.8°F | Ht 65.0 in | Wt 228.7 lb

## 2012-10-13 DIAGNOSIS — D219 Benign neoplasm of connective and other soft tissue, unspecified: Secondary | ICD-10-CM

## 2012-10-13 DIAGNOSIS — N926 Irregular menstruation, unspecified: Secondary | ICD-10-CM

## 2012-10-13 DIAGNOSIS — D259 Leiomyoma of uterus, unspecified: Secondary | ICD-10-CM

## 2012-10-13 DIAGNOSIS — D649 Anemia, unspecified: Secondary | ICD-10-CM

## 2012-10-13 DIAGNOSIS — N939 Abnormal uterine and vaginal bleeding, unspecified: Secondary | ICD-10-CM | POA: Insufficient documentation

## 2012-10-13 LAB — POCT PREGNANCY, URINE: Preg Test, Ur: NEGATIVE

## 2012-10-13 MED ORDER — MEGESTROL ACETATE 40 MG PO TABS: ORAL_TABLET | ORAL | Status: DC

## 2012-10-13 NOTE — Progress Notes (Signed)
GYNECOLOGY CLINIC PROGRESS NOTE  47 y.o. [id number] here for endometrial biopsy in the setting of menorrhagia.  Was recently diagnosed with VTE and was hospitalized from 10/04/12 to 10/07/12; currently on anticoagulation.  Was noted to have a hemoglobin of 5.3, transfused with 3 units of pRBCs.  Reports having a polypectomy in 2008 for menorrhagia which helped decrease amount of bleeding, but it then increased again.  Uses 1 pad every 1-2 hours on the heavy days of her period; periods last 7 days.    The following portions of the patient's history were reviewed and updated as appropriate: allergies, current medications, past family history, past medical history, past social history, past surgical history and problem list.  ENDOMETRIAL BIOPSY     The indications for endometrial biopsy were reviewed.   Risks of the biopsy including cramping, bleeding, infection, uterine perforation, inadequate specimen and need for additional procedures  were discussed. The patient states she understands and agrees to undergo procedure today. Consent was signed. Time out was performed. Urine HCG was negative. A sterile speculum was placed in the patient's vagina and the cervix was prepped with Betadine. A single-toothed tenaculum was placed on the anterior lip of the cervix to stabilize it. The 3 mm pipelle was introduced into the endometrial cavity without difficulty to a depth of 9 cm, and a moderate amount of tissue was obtained and sent to pathology. The instruments were removed from the patient's vagina. Minimal bleeding from the cervix was noted.   The patient tolerated the procedure well.  Routine post-procedure instructions were given to the patient. The patient will be called with the results and given recommendations for further management.   Patient is interested in endometrial ablation, information was given to her to review at home.  Megace prescribed as needed, bleeding precautions reviewed. Pelvic ultrasound  ordered; will follow up results and manage accordingly and this will help decide which modality of endometrial ablation will be appropriate for the patient.

## 2012-10-13 NOTE — Patient Instructions (Signed)
Endometrial Ablation Endometrial ablation removes the lining of the uterus (endometrium). It is usually a same day, outpatient treatment. Ablation helps avoid major surgery (such as a hysterectomy). A hysterectomy is removal of the cervix and uterus. Endometrial ablation has less risk and complications, has a shorter recovery period and is less expensive. After endometrial ablation, most women will have little or no menstrual bleeding. You may not keep your fertility. Pregnancy is no longer likely after this procedure but if you are pre-menopausal, you still need to use a reliable method of birth control following the procedure because pregnancy can occur. REASONS TO HAVE THE PROCEDURE MAY INCLUDE:  Heavy periods.  Bleeding that is causing anemia.  Anovulatory bleeding, very irregular, bleeding.  Bleeding submucous fibroids (on the lining inside the uterus) if they are smaller than 3 centimeters. REASONS NOT TO HAVE THE PROCEDURE MAY INCLUDE:  You wish to have more children.  You have a pre-cancerous or cancerous problem. The cause of any abnormal bleeding must be diagnosed before having the procedure.  You have pain coming from the uterus.  You have a submucus fibroid larger than 3 centimeters.  You recently had a baby.  You recently had an infection in the uterus.  You have a severe retro-flexed, tipped uterus and cannot insert the instrument to do the ablation.  You had a Cesarean section or deep major surgery on the uterus.  The inner cavity of the uterus is too large for the endometrial ablation instrument. RISKS AND COMPLICATIONS   Perforation of the uterus.  Bleeding.  Infection of the uterus, bladder or vagina.  Injury to surrounding organs.  Cutting the cervix.  An air bubble to the lung (air embolus).  Pregnancy following the procedure.  Failure of the procedure to help the problem requiring hysterectomy.  Decreased ability to diagnose cancer in the lining of  the uterus. BEFORE THE PROCEDURE  The lining of the uterus must be tested to make sure there is no pre-cancerous or cancer cells present.  Medications may be given to make the lining of the uterus thinner.  Ultrasound may be used to evaluate the size and look for abnormalities of the uterus.  Future pregnancy is not desired. PROCEDURE  There are different ways to destroy the lining of the uterus.   Resectoscope - radio frequency-alternating electric current is the most common one used.  Cryotherapy - freezing the lining of the uterus.  Heated Free Liquid - heated salt (saline) solution inserted into the uterus.  Microwave - uses high energy microwaves in the uterus.  Thermal Balloon - a catheter with a balloon tip is inserted into the uterus and filled with heated fluid. Your caregiver will talk with you about the method used in this clinic. They will also instruct you on the pros and cons of the procedure. Endometrial ablation is performed along with a procedure called operative hysteroscopy. A narrow viewing tube is inserted through the birth canal (vagina) and through the cervix into the uterus. A tiny camera attached to the viewing tube (hysteroscope) allows the uterine cavity to be shown on a TV monitor during surgery. Your uterus is filled with a harmless liquid to make the procedure easier. The lining of the uterus is then removed. The lining can also be removed with a resectoscope which allows your surgeon to cut away the lining of the uterus under direct vision. Usually, you will be able to go home within an hour after the procedure. [place]  not drive for 24 hours.  No tampons, douching or intercourse for 2 weeks or until your caregiver approves.  Rest at home for 24 to 48 hours. You may then resume normal activities unless told differently by your caregiver.  Take your temperature two times a day for 4 days, and record it.  Take any medications your  caregiver has ordered, as directed.  Use some form of contraception if you are pre-menopausal and do not want to get pregnant. Bleeding after the procedure is normal. It varies from light spotting and mildly watery to bloody discharge for 4 to 6 weeks. You may also have mild cramping. Only take over-the-counter or prescription medicines for pain, discomfort, or fever as directed by your caregiver. Do not use aspirin, as this may aggravate bleeding. Frequent urination during the first 24 hours is normal. You will not know how effective your surgery is until at least 3 months after the surgery. SEEK IMMEDIATE MEDICAL CARE IF:   Bleeding is heavier than a normal menstrual cycle.  An oral temperature above 102 F (38.9 C) develops.  You have increasing cramps or pains not relieved with medication or develop belly (abdominal) pain which does not seem to be related to the same area of earlier cramping and pain.  You are light headed, weak or have fainting episodes.  You develop pain in the shoulder strap areas.  You have chest or leg pain.  You have abnormal vaginal discharge.  You have painful urination. Document Released: 01/24/2004 Document Revised: 06/08/2011 Document Reviewed: 04/23/2007 [facility] Patient Information 2014 [place], [place].

## 2012-10-18 ENCOUNTER — Ambulatory Visit (HOSPITAL_COMMUNITY): Admission: RE | Admit: 2012-10-18 | Payer: Self-pay | Source: Ambulatory Visit

## 2012-10-18 ENCOUNTER — Encounter: Payer: Self-pay | Admitting: Obstetrics & Gynecology

## 2012-10-18 ENCOUNTER — Ambulatory Visit (HOSPITAL_COMMUNITY)
Admission: RE | Admit: 2012-10-18 | Discharge: 2012-10-18 | Disposition: A | Discharge status: Home / Self Care | Payer: Self-pay | Source: Ambulatory Visit | Attending: Obstetrics & Gynecology | Admitting: Obstetrics & Gynecology

## 2012-10-18 DIAGNOSIS — N852 Hypertrophy of uterus: Secondary | ICD-10-CM | POA: Insufficient documentation

## 2012-10-18 DIAGNOSIS — N920 Excessive and frequent menstruation with regular cycle: Secondary | ICD-10-CM | POA: Insufficient documentation

## 2012-10-18 DIAGNOSIS — N83209 Unspecified ovarian cyst, unspecified side: Secondary | ICD-10-CM | POA: Insufficient documentation

## 2012-10-18 DIAGNOSIS — D259 Leiomyoma of uterus, unspecified: Secondary | ICD-10-CM | POA: Insufficient documentation

## 2012-10-18 DIAGNOSIS — N939 Abnormal uterine and vaginal bleeding, unspecified: Secondary | ICD-10-CM

## 2012-10-18 DIAGNOSIS — D219 Benign neoplasm of connective and other soft tissue, unspecified: Secondary | ICD-10-CM

## 2012-10-26 ENCOUNTER — Encounter: Payer: Self-pay | Admitting: Obstetrics & Gynecology

## 2012-10-28 ENCOUNTER — Encounter: Payer: Self-pay | Admitting: *Deleted

## 2012-10-28 ENCOUNTER — Telehealth: Payer: Self-pay | Admitting: *Deleted

## 2012-10-28 NOTE — Telephone Encounter (Addendum)
Pt called with concerns and questions about several issues. She stated that she had been given megestrol and it caused her to have too much cramping so she stopped taking it. She wants to know what to do next for her bleeding. She also would like to know her test results. I called pt and discussed her concern. She stated that she was taking the megestrol at a time when she was not having heavy bleeding and she felt some contractions of her uterus which were uncomfortable. I advised pt that she only needs to take the megestrol when she is having heavy bleeding and it is possible for her to have some cramping or feeling like she is having contractions. She stated that she misunderstood. She denies cramping or bleeding at present. I then informed her that a message had been sent to her My Chart account by Dr. Harolyn Rutherford regarding her test results and additional information.  Pt voiced understanding.

## 2012-11-21 ENCOUNTER — Emergency Department (HOSPITAL_COMMUNITY)
Admission: EM | Admit: 2012-11-21 | Discharge: 2012-11-21 | Disposition: A | Discharge status: Home / Self Care | Payer: Self-pay | Attending: Emergency Medicine | Admitting: Emergency Medicine

## 2012-11-21 ENCOUNTER — Encounter (HOSPITAL_COMMUNITY): Payer: Self-pay | Admitting: Emergency Medicine

## 2012-11-21 DIAGNOSIS — IMO0001 Reserved for inherently not codable concepts without codable children: Secondary | ICD-10-CM | POA: Insufficient documentation

## 2012-11-21 DIAGNOSIS — M79609 Pain in unspecified limb: Secondary | ICD-10-CM | POA: Insufficient documentation

## 2012-11-21 DIAGNOSIS — F17200 Nicotine dependence, unspecified, uncomplicated: Secondary | ICD-10-CM | POA: Insufficient documentation

## 2012-11-21 DIAGNOSIS — Z87891 Personal history of nicotine dependence: Secondary | ICD-10-CM | POA: Insufficient documentation

## 2012-11-21 DIAGNOSIS — M7989 Other specified soft tissue disorders: Secondary | ICD-10-CM | POA: Insufficient documentation

## 2012-11-21 DIAGNOSIS — D649 Anemia, unspecified: Secondary | ICD-10-CM | POA: Insufficient documentation

## 2012-11-21 DIAGNOSIS — M79604 Pain in right leg: Secondary | ICD-10-CM

## 2012-11-21 DIAGNOSIS — Z86718 Personal history of other venous thrombosis and embolism: Secondary | ICD-10-CM | POA: Insufficient documentation

## 2012-11-21 DIAGNOSIS — Z86711 Personal history of pulmonary embolism: Secondary | ICD-10-CM | POA: Insufficient documentation

## 2012-11-21 DIAGNOSIS — Z7901 Long term (current) use of anticoagulants: Secondary | ICD-10-CM | POA: Insufficient documentation

## 2012-11-21 DIAGNOSIS — Z79899 Other long term (current) drug therapy: Secondary | ICD-10-CM | POA: Insufficient documentation

## 2012-11-21 LAB — CBC WITH DIFFERENTIAL/PLATELET
Eosinophils Relative: 2 % (ref 0–5)
HCT: 35.5 % — ABNORMAL LOW (ref 36.0–46.0)
Hemoglobin: 11.4 g/dL — ABNORMAL LOW (ref 12.0–15.0)
Lymphocytes Relative: 24 % (ref 12–46)
MCV: 79.2 fL (ref 78.0–100.0)
Monocytes Absolute: 0.3 10*3/uL (ref 0.1–1.0)
Monocytes Relative: 5 % (ref 3–12)
Neutro Abs: 4.7 10*3/uL (ref 1.7–7.7)
WBC: 6.8 10*3/uL (ref 4.0–10.5)

## 2012-11-21 LAB — POCT I-STAT, CHEM 8
BUN: 9 mg/dL (ref 6–23)
Chloride: 104 mEq/L (ref 96–112)
Creatinine, Ser: 0.9 mg/dL (ref 0.50–1.10)
Hemoglobin: 12.2 g/dL (ref 12.0–15.0)
Potassium: 3.8 mEq/L (ref 3.5–5.1)
Sodium: 141 mEq/L (ref 135–145)

## 2012-11-21 NOTE — ED Provider Notes (Signed)
Medical screening examination/treatment/procedure(s) were performed by non-physician practitioner and as supervising physician I was immediately available for consultation/collaboration.   Osvaldo Shipper, MD 11/21/12 [phone]

## 2012-11-21 NOTE — Progress Notes (Signed)
Patient confirms her pcp is NP Selina Cooley. System updated.

## 2012-11-21 NOTE — ED Provider Notes (Signed)
CSN: [id number]     Arrival date & time 11/21/12  1847 History   First MD Initiated Contact with Patient 11/21/12 1944     Chief Complaint  Patient presents with  . Leg Pain   (Consider location/radiation/quality/duration/timing/severity/associated sxs/prior Treatment) HPI Patient is a 47 year old female that presents to emergency department with complaint of a right leg pain. Patient states she was diagnosed with a DVT and PE on 10/04/2012. At that time she was admitted and started on Lovenox and Coumadin. States currently he just takes Coumadin. Has not had her INR checked and few weeks. States 2 days ago noted mild discomfort in her right calf. States the last 2 days pain progressed from the right calf into the right thigh. States pain comes and goes, states is sharp, feels like spasms. Patient states that for her she did think it was just spasms, however she states she remembered that her her pain was on the other leg where she had the blood clot. Patient states she became worried and came to the ER. Patient denies any chest pain or shortness of breath at this time. Patient has been taking her Coumadin as prescribed and did not miss any doses. Patient denies any leg injury. Pain is worsened with movement and ambulation. Past Medical History  Diagnosis Date  . Medical history non-contributory   . Anemia   . DVT (deep venous thrombosis)   . PE (pulmonary embolism)    Past Surgical History  Procedure Laterality Date  . Polypectomy  2008    Removal of uterine polyp  . Cesarean section     Family History  Problem Relation Age of Onset  . Stroke Sister   . Hypertension Mother   . Kidney disease Mother   . Hypertension Father    History  Substance Use Topics  . Smoking status: Former Smoker -- 10 years    Quit date: 10/04/2011  . Smokeless tobacco: Never Used  . Alcohol Use: Yes     Comment: social   OB History   Grav Para Term Preterm Abortions TAB SAB Ect Mult Living   _0 0  _1 0 0 1     Review of Systems  Constitutional: Negative for fever and chills.  HENT: Negative for neck pain and neck stiffness.   Respiratory: Negative for cough, chest tightness and shortness of breath.   Cardiovascular: Positive for leg swelling. Negative for chest pain and palpitations.  Gastrointestinal: Negative for nausea, vomiting, abdominal pain and diarrhea.  Genitourinary: Negative for dysuria, flank pain, vaginal bleeding, vaginal discharge, vaginal pain and pelvic pain.  Musculoskeletal: Positive for myalgias.  Skin: Negative for rash.  Neurological: Negative for dizziness, weakness and headaches.  All other systems reviewed and are negative.    Allergies  Ampicillin and Sulfa antibiotics  Home Medications   Current Outpatient Rx  Name  Route  Sig  Dispense  Refill  . ferrous sulfate 325 (65 FE) MG tablet   Oral   Take 1 tablet (325 mg total) by mouth 3 (three) times daily with meals.   60 tablet   3   . megestrol (MEGACE) 40 MG tablet   Oral   Take 40 mg by mouth 2 (two) times daily.         Marland Kitchen warfarin (COUMADIN) 5 MG tablet   Oral   Take 10-12.5 mg by mouth daily. 2 tabs daily except 2.5 tabs on Sunday.          BP  148/89  Pulse 93  Temp(Src) 98.6 F (37 C) (Oral)  SpO2 100%  LMP 11/21/2012 Physical Exam  Nursing note and vitals reviewed. Constitutional: She appears well-developed and well-nourished. No distress.  HENT:  Head: Normocephalic.  Eyes: Conjunctivae are normal.  Neck: Neck supple.  Cardiovascular: Normal rate, regular rhythm and normal heart sounds.   Pulmonary/Chest: Effort normal and breath sounds normal. No respiratory distress. She has no wheezes. She has no rales.  Musculoskeletal: She exhibits no edema.  Normal appearing right leg with no obvious swelling. Dorsal pedal pulses intact bilaterally. Mild tenderness to palpation over right posterior calf and posterior hamstring. No pain with right foot dorsiflexion. Negative  Homans sign full range of motion of the knee and hip joint  Neurological: She is alert.  Skin: Skin is warm and dry.  Psychiatric: She has a normal mood and affect. Her behavior is normal.    ED Course  Procedures (including critical care time) Labs Review  Results for orders placed during the hospital encounter of 11/21/12  CBC WITH DIFFERENTIAL      Result Value Range   WBC 6.8  4.0 - 10.5 K/uL   RBC 4.48  3.87 - 5.11 MIL/uL   Hemoglobin 11.4 (*) 12.0 - 15.0 g/dL   HCT 35.5 (*) 36.0 - 46.0 %   MCV 79.2  78.0 - 100.0 fL   MCH 25.4 (*) 26.0 - 34.0 pg   MCHC 32.1  30.0 - 36.0 g/dL   RDW 25.0 (*) 11.5 - 15.5 %   Platelets 267  150 - 400 K/uL   Neutrophils Relative % 69  43 - 77 %   Neutro Abs 4.7  1.7 - 7.7 K/uL   Lymphocytes Relative 24  12 - 46 %   Lymphs Abs 1.6  0.7 - 4.0 K/uL   Monocytes Relative 5  3 - 12 %   Monocytes Absolute 0.3  0.1 - 1.0 K/uL   Eosinophils Relative 2  0 - 5 %   Eosinophils Absolute 0.1  0.0 - 0.7 K/uL   Basophils Relative 0  0 - 1 %   Basophils Absolute 0.0  0.0 - 0.1 K/uL  PROTIME-INR      Result Value Range   Prothrombin Time 28.9 (*) 11.6 - 15.2 seconds   INR 2.85 (*) 0.00 - 1.49  POCT I-STAT, CHEM 8      Result Value Range   Sodium 141  135 - 145 mEq/L   Potassium 3.8  3.5 - 5.1 mEq/L   Chloride 104  96 - 112 mEq/L   BUN 9  6 - 23 mg/dL   Creatinine, Ser 0.90  0.50 - 1.10 mg/dL   Glucose, Bld 156 (*) 70 - 99 mg/dL   Calcium, Ion 1.22  1.12 - 1.23 mmol/L   TCO2 25  0 - 100 mmol/L   Hemoglobin 12.2  12.0 - 15.0 g/dL   HCT 36.0  36.0 - 46.0 %   No results found.   Imaging Review No results found.  MDM   1. Right leg pain   2. Anticoagulated     Patient here with right leg spasms. History of left lower extremity DVT a month ago. Patient is on Coumadin. Her INR is 2.85. She is therapeutic. There is no subjective finding indicative of a DVT in her right leg however given patient's history it is concerning. I discussed with patient  the plan. If the pain worsens or develops swelling in that leg she will need a  venous Doppler either ordered by her primary care doctor or she is to return to emergency department. At this time Dopplers available in ER. Patient is comfortable the plan and voices understanding.  Filed Vitals:   11/21/12 1909  BP: 148/89  Pulse: 93  Temp: 98.6 F (37 C)  TempSrc: Oral  SpO2: 100%       Renold Genta, PA-C 11/21/12 2138

## 2012-11-21 NOTE — ED Notes (Signed)
Pt states she was here with a hgb of 5 and a LLE blood clot and bilateral PEs on July 8th and is now having cramping in her R leg. Wants to get it checked out.

## 2013-02-02 ENCOUNTER — Other Ambulatory Visit: Payer: Self-pay

## 2013-02-08 ENCOUNTER — Other Ambulatory Visit: Payer: Self-pay | Admitting: Internal Medicine

## 2013-02-08 DIAGNOSIS — E010 Iodine-deficiency related diffuse (endemic) goiter: Secondary | ICD-10-CM

## 2013-02-09 ENCOUNTER — Telehealth: Payer: Self-pay | Admitting: Internal Medicine

## 2013-02-09 NOTE — Telephone Encounter (Signed)
C/D 02/09/13 for appt. 02/15/13

## 2013-02-09 NOTE — Telephone Encounter (Signed)
S/w pt and gve np appt 11/19 @ 10:30 w/Dr. Juliann Mule Referring Dr. Letha Cape Dx-Eval DVT/PE on coumadin since July Welcome packet emailed.

## 2013-02-10 ENCOUNTER — Other Ambulatory Visit: Payer: Self-pay | Admitting: Internal Medicine

## 2013-02-10 ENCOUNTER — Ambulatory Visit
Admission: RE | Admit: 2013-02-10 | Discharge: 2013-02-10 | Disposition: A | Discharge status: Home / Self Care | Payer: BC Managed Care – PPO | Source: Ambulatory Visit | Attending: Internal Medicine | Admitting: Internal Medicine

## 2013-02-10 DIAGNOSIS — E010 Iodine-deficiency related diffuse (endemic) goiter: Secondary | ICD-10-CM

## 2013-02-10 DIAGNOSIS — E041 Nontoxic single thyroid nodule: Secondary | ICD-10-CM

## 2013-02-15 ENCOUNTER — Encounter: Payer: Self-pay | Admitting: Internal Medicine

## 2013-02-15 ENCOUNTER — Ambulatory Visit (HOSPITAL_BASED_OUTPATIENT_CLINIC_OR_DEPARTMENT_OTHER): Payer: BC Managed Care – PPO | Admitting: Internal Medicine

## 2013-02-15 ENCOUNTER — Telehealth: Payer: Self-pay | Admitting: *Deleted

## 2013-02-15 ENCOUNTER — Telehealth: Payer: Self-pay | Admitting: Internal Medicine

## 2013-02-15 ENCOUNTER — Ambulatory Visit (HOSPITAL_BASED_OUTPATIENT_CLINIC_OR_DEPARTMENT_OTHER): Payer: BC Managed Care – PPO | Admitting: Lab

## 2013-02-15 ENCOUNTER — Ambulatory Visit: Payer: BC Managed Care – PPO

## 2013-02-15 VITALS — BP 156/95 | HR 80 | Temp 98.5°F | Resp 20 | Ht 65.0 in | Wt 252.7 lb

## 2013-02-15 DIAGNOSIS — D219 Benign neoplasm of connective and other soft tissue, unspecified: Secondary | ICD-10-CM

## 2013-02-15 DIAGNOSIS — D259 Leiomyoma of uterus, unspecified: Secondary | ICD-10-CM

## 2013-02-15 DIAGNOSIS — R911 Solitary pulmonary nodule: Secondary | ICD-10-CM

## 2013-02-15 DIAGNOSIS — N921 Excessive and frequent menstruation with irregular cycle: Secondary | ICD-10-CM

## 2013-02-15 DIAGNOSIS — D649 Anemia, unspecified: Secondary | ICD-10-CM

## 2013-02-15 DIAGNOSIS — Z86718 Personal history of other venous thrombosis and embolism: Secondary | ICD-10-CM

## 2013-02-15 DIAGNOSIS — D500 Iron deficiency anemia secondary to blood loss (chronic): Secondary | ICD-10-CM

## 2013-02-15 DIAGNOSIS — I8290 Acute embolism and thrombosis of unspecified vein: Secondary | ICD-10-CM

## 2013-02-15 DIAGNOSIS — N939 Abnormal uterine and vaginal bleeding, unspecified: Secondary | ICD-10-CM

## 2013-02-15 LAB — CBC WITH DIFFERENTIAL/PLATELET
Basophils Absolute: 0.1 10*3/uL (ref 0.0–0.1)
Eosinophils Absolute: 0.1 10*3/uL (ref 0.0–0.5)
HCT: 36.2 % (ref 34.8–46.6)
HGB: 11.6 g/dL (ref 11.6–15.9)
MCV: 83 fL (ref 79.5–101.0)
MONO%: 7.8 % (ref 0.0–14.0)
NEUT#: 5.9 10*3/uL (ref 1.5–6.5)
NEUT%: 71.8 % (ref 38.4–76.8)
Platelets: 326 10*3/uL (ref 145–400)
RDW: 15.6 % — ABNORMAL HIGH (ref 11.2–14.5)

## 2013-02-15 LAB — IRON AND TIBC CHCC
%SAT: 5 % — ABNORMAL LOW (ref 21–57)
Iron: 26 ug/dL — ABNORMAL LOW (ref 41–142)

## 2013-02-15 NOTE — Patient Instructions (Signed)
Warfarin tablets What is this medicine? WARFARIN (WAR far in) is an anticoagulant. It is used to treat or prevent clots in the veins, arteries, lungs, or heart. This medicine may be used for other purposes; ask your health care provider or pharmacist if you have questions. COMMON BRAND NAME(S): Coumadin, Jantoven  What should I tell my health care provider before I take this medicine? They need to know if you have any of these conditions: -alcoholism -anemia -bleeding disorders -cancer -diabetes -heart disease -high blood pressure -history of bleeding in the gastrointestinal tract -history of stroke or other brain injury or disease -kidney or liver disease -protein C deficiency -protein S deficiency -psychosis or dementia -recent injury, recent or planned surgery or procedure -an unusual or allergic reaction to warfarin, other medicines, foods, dyes, or preservatives -pregnant or trying to get pregnant -breast-feeding How should I use this medicine? Take this medicine by mouth with a glass of water. Follow the directions on the prescription label. You can take this medicine with or without food. Take your medicine at the same time each day. Do not take it more often than directed. Do not stop taking except on your doctor's advice. Stopping this medicine may increase your risk of a blood clot. Be sure to refill your prescription before you run out of medicine. If your doctor or healthcare professional calls to change your dose, write down the dose and any other instructions. Always read the dose and instructions back to him or her to make sure you understand them. Tell your doctor or healthcare professional what strength of tablets you have on hand. Ask how many tablets you should take to equal your new dose. Write the date on the new instructions and keep them near your medicine. If you are told to stop taking your medicine until your next blood test, call your doctor or healthcare  professional if you do not hear anything within 24 hours of the test to find out your new dose or when to restart your prior dose. A special MedGuide will be given to you by the pharmacist with each prescription and refill. Be sure to read this information carefully each time. Talk to your pediatrician regarding the use of this medicine in children. Special care may be needed. Overdosage: If you think you have taken too much of this medicine contact a poison control center or emergency room at once. NOTE: This medicine is only for you. Do not share this medicine with others. What if I miss a dose? It is important not to miss a dose. If you miss a dose, call your healthcare provider. Take the dose as soon as possible on the same day. If it is almost time for your next dose, take only that dose. Do not take double or extra doses to make up for a missed dose. What may interact with this medicine? Do not take this medicine with any of the following medications: -agents that prevent or dissolve blood clots -aspirin or other salicylates -danshen -dextrothyroxine -mifepristone -St. John's Wort -red yeast rice This medicine may also interact with the following medications: -acetaminophen -agents that lower cholesterol -alcohol -allopurinol -amiodarone -antibiotics or medicines for treating bacterial, fungal or viral infections -azathioprine -barbiturate medicines for inducing sleep or treating seizures -certain medicines for diabetes -certain medicines for heart rhythm problems -certain medicines for high blood pressure -chloral hydrate -cisapride -disulfiram -female hormones, including contraceptive or birth control pills -general anesthetics -herbal or dietary products like garlic, ginkgo, ginseng, green tea, or  kava kava -influenza virus vaccine -female hormones -medicines for mental depression or psychosis -medicines for some types of cancer -medicines for stomach  problems -methylphenidate -NSAIDs, medicines for pain and inflammation, like ibuprofen or naproxen -propoxyphene -quinidine, quinine -raloxifene -seizure or epilepsy medicine like carbamazepine, phenytoin, and valproic acid -steroids like cortisone and prednisone -tamoxifen -thyroid medicine -tramadol -vitamin c, vitamin e, and vitamin K -zafirlukast -zileuton This list may not describe all possible interactions. Give your health care provider a list of all the medicines, herbs, non-prescription drugs, or dietary supplements you use. Also tell them if you smoke, drink alcohol, or use illegal drugs. Some items may interact with your medicine. What should I watch for while using this medicine? Visit your doctor or health care professional for regular checks on your progress. You will need to have a blood test called a PT/INR regularly. The PT/INR blood test is done to make sure you are getting the right dose of this medicine. It is important to not miss your appointment for the blood tests. When you first start taking this medicine, these tests are done often. Once the correct dose is determined and you take your medicine properly, these tests can be done less often. Notify your doctor or health care professional and seek emergency treatment if you develop breathing problems; changes in vision; chest pain; severe, sudden headache; pain, swelling, warmth in the leg; trouble speaking; sudden numbness or weakness of the face, arm or leg. These can be signs that your condition has gotten worse. While you are taking this medicine, carry an identification card with your name, the name and dose of medicine(s) being used, and the name and phone number of your doctor or health care professional or person to contact in an emergency. Do not start taking or stop taking any medicines or over-the-counter medicines except on the advice of your doctor or health care professional. You should discuss your diet with  your doctor or health care professional. Do not make major changes in your diet. Vitamin K can affect how well this medicine works. Many foods contain vitamin K. It is important to eat a consistent amount of foods with vitamin K. Other foods with vitamin K that you should eat in consistent amounts are asparagus, basil, beef or pork liver, black eyed peas, broccoli, brussel sprouts, cabbage, chickpeas, cucumber with peel, green onions, green tea, okra, parsley, peas, thyme, and green leafy vegetables like beet greens, collard greens, endive, kale, mustard greens, spinach, turnip greens, watercress, or certain lettuces like green leaf or romaine. This medicine can cause birth defects or bleeding in an unborn child. Women of childbearing age should use effective birth control while taking this medicine. If a woman becomes pregnant while taking this medicine, she should discuss the potential risks and her options with her health care professional. Avoid sports and activities that might cause injury while you are using this medicine. Severe falls or injuries can cause unseen bleeding. Be careful when using sharp tools or knives. Consider using an Copy. Take special care brushing or flossing your teeth. Report any injuries, bruising, or red spots on the skin to your doctor or health care professional. If you have an illness that causes vomiting, diarrhea, or fever for more than a few days, contact your doctor. Also check with your doctor if you are unable to eat for several days. These problems can change the effect of this medicine. Even after you stop taking this medicine, it takes several days before your body  recovers its normal ability to clot blood. Ask your doctor or health care professional how long you need to be careful. If you are going to have surgery or dental work, tell your doctor or health care professional that you have been taking this medicine. What side effects may I notice from  receiving this medicine? Side effects that you should report to your doctor or health care professional as soon as possible: -back pain -chills -dizziness -fever -heavy menstrual bleeding or vaginal bleeding -painful, blue, or purple toes -painful, prolonged erection -signs and symptoms of bleeding such as bloody or black, tarry stools; red or dark-brown urine; spitting up blood or brown material that looks like coffee grounds; red spots on the skin; unusual bruising or bleeding from the eye, gums, or nose-skin rash, itching or skin damage -stomach pain -unusually weak or tired -yellowing of skin or eyes Side effects that usually do not require medical attention (report to your doctor or health care professional if they continue or are bothersome): -diarrhea -hair loss This list may not describe all possible side effects. Call your doctor for medical advice about side effects. You may report side effects to FDA at 1-800-FDA-1088. Where should I keep my medicine? Keep out of the reach of children. Store at room temperature between 15 and 30 degrees C (59 and 86 degrees F). Protect from light. Throw away any unused medicine after the expiration date. Do not flush down the toilet. NOTE: This sheet is a summary. It may not cover all possible information. If you have questions about this medicine, talk to your doctor, pharmacist, or health care provider.  2014, [employer]. (2012-10-05 12:17:56) Deep Vein Thrombosis A deep vein thrombosis (DVT) is a blood clot that develops in a deep vein. A DVT is a clot in the deep, larger veins of the leg, arm, or pelvis. These are more dangerous than clots that might form in veins near the surface of the body. A DVT can lead to complications if the clot breaks off and travels in the bloodstream to the lungs.  A DVT can damage the valves in your leg veins, so that instead of flowing upwards, the blood pools in the lower leg. This is called  post-thrombotic syndrome, and can result in pain, swelling, discoloration, and sores on the leg. Once identified, a DVT can be treated. It can also be prevented in some circumstances. Once you have had a DVT, you may be at increased risk for a DVT in the future. CAUSES Blood clots form in a vein for different reasons. Usually several things contribute to blood clots. Contributing factors include:  The flow of blood slows down.  The inside of the vein is damaged in some way.  The person has a condition that makes blood clot more easily. Some people are more likely than others to develop blood clots. That is because they have more factors that make clots likely. These are called risk factors. Risk factors include:   Older age, especially over 24 years old.  Having a history of blood clots. This means you have had one before. Or, it means that someone else in your family has had blood clots. You may have a genetic tendency to form clots.  Having major or lengthy surgery. This is especially true for surgery on the hip, knee, or belly (abdomen). Hip surgery is particularly high risk.  Breaking a hip or leg.  Sitting or lying still for a long time. This includes long distance travel, paralysis,  or recovery from an illness or surgery.  Cancer, or cancer treatment.  Having a long, thin tube (catheter) placed inside a vein during a medical procedure.  Being overweight (obese).  Pregnancy and childbirth. Hormone changes make the blood clot more easily during pregnancy. The fetus puts pressure on the veins of the pelvis. There is also risk of injury to veins during delivery or a caesarean. The risk is at its highest just after childbirth.  Medicines with the female hormone estrogen. This includes birth control pills and hormone replacement therapy.  Smoking.  Other circulation or heart problems. SYMPTOMS When a clot forms, it can either partially or totally block the blood flow in that vein.  Symptoms of a DVT can include:  Swelling of the leg or arm, especially if one side is much worse.  Warmth and redness of the leg or arm, especially if one side is much worse.  Pain in an arm or leg. If the clot is in the leg, symptoms may be more noticeable or worse when standing or walking. The symptoms of a DVT that has traveled to the lungs (pulmonary embolism, PE) usually start suddenly, and include:  Shortness of breath.  Coughing.  Coughing up blood or blood-tinged phlegm.  Chest pain. The chest pain is often worse with deep breaths.  Rapid heartbeat. Anyone with these symptoms should get emergency medical treatment right away. Call your local emergency services (911 in [place] if you have these symptoms. DIAGNOSIS If a DVT is suspected, your caregiver will take a full medical history and carry out a physical exam. Tests that also may be required include:  Blood tests, including studies of the clotting properties of the blood.  Ultrasonography to see if you have clots in your legs or lungs.  X-rays to show the flow of blood when dye is injected into the veins (venography).  Studies of your lungs, if you have any chest symptoms. PREVENTION  Exercise the legs regularly. Take a brisk 30 minute walk every day.  Maintain a weight that is appropriate for your height.  Avoid sitting or lying in bed for long periods of time without moving your legs.  Women, particularly those over the age of 63, should consider the risks and benefits of taking estrogen medicines, including birth control pills.  Do not smoke, especially if you take estrogen medicines.  Long distance travel can increase your risk of DVT. You should exercise your legs by walking or pumping the muscles every hour.  In-hospital prevention:  Many of the risk factors above relate to situations that exist with hospitalization, either for illness, injury, or elective surgery.  Your caregiver will assess you for the  need for venous thromboembolism prophylaxis when you are admitted to the hospital. If you are having surgery, your surgeon will assess you the day of or day after surgery.  Prevention may include medical and nonmedical measures. TREATMENT Treatment for DVT helps prevent death and disability. The most common treatment for DVT is blood thinning (anticoagulant) medicine, which reduces the blood's tendency to clot. Anticoagulants can stop new blood clots from forming and old ones from growing. They cannot dissolve existing clots. Your body does this by itself over time. Anticoagulants can be given by mouth, by intravenous (IV) access, or by injection. Your caregiver will determine the best program for you.  Heparin or related medicines (low molecular weight heparin) are usually the first treatment for a blood clot. They act quickly. However, they cannot be taken orally.  Heparin can cause a fall in a component of blood that stops bleeding and forms blood clots (platelets). You will be monitored with blood tests to be sure this does not occur.  Warfarin is an anticoagulant that can be swallowed (taken orally). It takes a few days to start working, so usually heparin or related medicines are used in combination. Once warfarin is working, heparin is usually stopped.  Less commonly, clot dissolving drugs (thrombolytics) are used to dissolve a DVT. They carry a high risk of bleeding, so they are used mainly in severe cases, where a life or limb is threatened.  Very rarely, a blood clot in the leg needs to be removed surgically.  If you are unable to take anticoagulants, your caregiver may arrange for you to have a filter placed in a main vein in your belly (abdomen). This filter prevents clots from traveling to your lungs. HOME CARE INSTRUCTIONS  Take all medicines prescribed by your caregiver. Follow the directions carefully.  Warfarin. Most people will continue taking warfarin after hospital discharge.  Your caregiver will advise you on the length of treatment (usually 3 6 months, sometimes lifelong).  Too much and too little warfarin are both dangerous. Too much warfarin increases the risk of bleeding. Too little warfarin continues to allow the risk for blood clots. While taking warfarin, you will need to have regular blood tests to measure your blood clotting time. These blood tests usually include both the prothrombin time (PT) and international normalized ratio (INR) tests. The PT and INR results allow your caregiver to adjust your dose of warfarin. The dose can change for many reasons. It is critically important that you take warfarin exactly as prescribed, and that you have your PT and INR levels drawn exactly as directed.  Many foods, especially foods high in vitamin K can interfere with warfarin and affect the PT and INR results. Foods high in vitamin K include spinach, kale, broccoli, cabbage, collard and turnip greens, brussels sprouts, peas, cauliflower, seaweed, and parsley as well as beef and pork liver, green tea, and soybean oil. You should eat a consistent amount of foods high in vitamin K. Avoid major changes in your diet, or notify your caregiver before changing your diet. Arrange a visit with a dietitian to answer your questions.  Many medicines can interfere with warfarin and affect the PT and INR results. You must tell your caregiver about any and all medicines you take, this includes all vitamins and supplements. Be especially cautious with aspirin and anti-inflammatory medicines. Ask your caregiver before taking these. Do not take or discontinue any prescribed or over-the-counter medicine except on the advice of your caregiver or pharmacist.  Warfarin can have side effects, primarily excessive bruising or bleeding. You will need to hold pressure over cuts for longer than usual. Your caregiver or pharmacist will discuss other potential side effects.  Alcohol can change the body's  ability to handle warfarin. It is best to avoid alcoholic drinks or consume only very small amounts while taking warfarin. Notify your caregiver if you change your alcohol intake.  Notify your dentist or other caregivers before procedures.  Activity. Ask your caregiver how soon you can go back to normal activities. It is important to stay active to prevent blood clots. If you are on anticoagulant medicine, avoid contact sports.  Exercise. It is very important to exercise. This is especially important while traveling, sitting or standing for long periods of time. Exercise your legs by walking or by pumping the  muscles frequently. Take frequent walks.  Compression stockings. These are tight elastic stockings that apply pressure to the lower legs. This pressure can help keep the blood in the legs from clotting. You may need to wear compressions stockings at home to help prevent a DVT.  Smoking. If you smoke, quit. Ask your caregiver for help with quitting smoking.  Learn as much as you can about DVT. Knowing more about the condition should help you keep it from coming back.  Wear a medical alert bracelet or carry a medical alert card. SEEK MEDICAL CARE IF:  You notice a rapid heartbeat.  You feel weaker or more tired than usual.  You feel faint.  You notice increased bruising.  You feel your symptoms are not getting better in the time expected.  You believe you are having side effects of medicine. SEEK IMMEDIATE MEDICAL CARE IF:  You have chest pain.  You have trouble breathing.  You have new or increased swelling or pain in one leg.  You cough up blood.  You notice blood in vomit, in a bowel movement, or in urine. MAKE SURE YOU:  Understand these instructions.  Will watch your condition.  Will get help right away if you are not doing well or get worse. Document Released: 03/16/2005 Document Revised: 12/09/2011 Document Reviewed: 05/08/2010 [facility] Patient Information  2014 [place]. Pulmonary Embolus A pulmonary (lung) embolus (PE) is a blood clot that has traveled from another place in the body to the lung. Most clots come from deep veins in the legs or pelvis. PE is a dangerous and potentially life-threatening condition that can be treated if identified. CAUSES Blood clots form in a vein for different reasons. Usually several things cause blood clots. They include:  The flow of blood slows down.  The inside of the vein is damaged in some way.  The person has a condition that makes the blood clot more easily. These conditions may include:  Older age (especially over 4 years old).  Having a history of blood clots.  Having major or lengthy surgery. Hip surgery is particularly high-risk.  Breaking a hip or leg.  Sitting or lying still for a long time.  Cancer or cancer treatment.  Having a long, thin tube (catheter) placed inside a vein during a medical procedure.  Being overweight (obese).  Pregnancy and childbirth.  Medicines with estrogen.  Smoking.  Other circulation or heart problems. SYMPTOMS  The symptoms of a PE usually start suddenly and include:  Shortness of breath.  Coughing.  Coughing up blood or blood-tinged mucus (phlegm).  Chest pain. Pain is often worse with deep breaths.  Rapid heartbeat. DIAGNOSIS  If a PE is suspected, your caregiver will take a medical history and carry out a physical exam. Your caregiver will check for the risk factors listed above. Tests that also may be required include:  Blood tests, including studies of the clotting properties of your blood.  Imaging tests. Ultrasound, CT, MRI, and other tests can all be used to see if you have clots in your legs or lungs. If you have a clot in your legs and have breathing or chest problems, your caregiver may conclude that you have a clot in your lungs. Further lung tests may not be needed.  Electrocardiography can look for heart strain from  blood clots in the lungs. PREVENTION   Exercise the legs regularly. Take a brisk 30 minute walk every day.  Maintain a weight that is appropriate for your height.  Avoid sitting or lying in bed for long periods of time without moving your legs.  Women, particularly those over the age of 60, should consider the risks and benefits of taking estrogen medicines, including birth control pills.  Do not smoke, especially if you take estrogen medicines.  Long-distance travel can increase your risk. You should exercise your legs by walking or pumping the muscles every hour.  In hospital prevention:  Your caregiver will assess your need for preventive PE care (prophylaxis) when you are admitted to the hospital. If you are having surgery, your surgeon will assess you the day of or day after surgery.  Prevention may include medical and nonmedical measures. TREATMENT   The most common treatment for a PE is blood thinning (anticoagulant) medicine, which reduces the blood's tendency to clot. Anticoagulants can stop new blood clots from forming and old ones from growing. They cannot dissolve existing clots. Your body does this by itself over time. Anticoagulants can be given by mouth, by intravenous (IV) access, or by injection. Your caregiver will determine the best program for you.  Less commonly, clot-dissolving drugs (thrombolytics) are used to dissolve a PE. They carry a high risk of bleeding, so they are used mainly in severe cases.  Very rarely, a blood clot in the leg needs to be removed surgically.  If you are unable to take anticoagulants, your caregiver may arrange for you to have a filter placed in a main vein in your abdomen. This filter prevents clots from traveling to your lungs. HOME CARE INSTRUCTIONS   Take all medicines prescribed by your caregiver. Follow the directions carefully.  Warfarin. Most people will continue taking warfarin after hospital discharge. Your caregiver will  advise you on the length of treatment (usually 3 6 months, sometimes lifelong).  Too much and too little warfarin are both dangerous. Too much warfarin increases the risk of bleeding. Too little warfarin continues to allow the risk for blood clots. While taking warfarin, you will need to have regular blood tests to measure your blood clotting time. These blood tests usually include both the prothrombin time (PT) and International Normalized Ratio (INR) tests. The PT and INR results allow your caregiver to adjust your dose of warfarin. The dose can change for many reasons. It is critically important that you take warfarin exactly as prescribed, and that you have your PT and INR levels drawn exactly as directed.  Many foods, especially foods high in vitamin K can interfere with warfarin and affect the PT and INR results. Foods high in vitamin K include spinach, kale, broccoli, cabbage, collard and turnip greens, brussels sprouts, peas, cauliflower, seaweed, and parsley as well as beef and pork liver, green tea, and soybean oil. You should eat a consistent amount of foods high in vitamin K. Avoid major changes in your diet, or notify your caregiver before changing your diet. Arrange a visit with a dietitian to answer your questions.  Many medicines can interfere with warfarin and affect the PT and INR results. You must tell your caregiver about any and all medicines you take, this includes all vitamins and supplements. Be especially cautious with aspirin and anti-inflammatory medicines. Ask your caregiver before taking these. Do not take or discontinue any prescribed or over-the-counter medicine except on the advice of your caregiver or pharmacist.  Warfarin can have side effects, such as excessive bruising or bleeding. You will need to hold pressure over cuts for longer than usual.  Alcohol can change the body's ability to handle  warfarin. It is best to avoid alcoholic drinks or consume only very small  amounts while taking warfarin. Notify your caregiver if you change your alcohol intake.  Notify your dentist or other caregivers before procedures.  Avoid contact sports.  Wear a medical alert bracelet or carry a medical alert card.  Ask your caregiver how soon you can go back to normal activities. Not being active can lead to new clots. Ask for a list of what you should and should not do.  Compression stockings. These are tight elastic stockings that apply pressure to the lower legs. This can help keep the blood in the legs from clotting. You may need to wear compressions stockings at home to help prevent clots.  Smoking. If you smoke, quit. Ask your caregiver for help with quitting smoking.  Learn as much as you can about PE. Educating yourself can help prevent PE from reoccurring. SEEK MEDICAL CARE IF:   You notice a rapid heartbeat.  You feel weaker or more tired than usual.  You feel faint.  You notice increased bruising.  Your symptoms are not getting better in the time expected.  You are having side effects of medicine. SEEK IMMEDIATE MEDICAL CARE IF:   You have chest pain.  You have trouble breathing.  You have new or increased swelling or pain in one leg.  You cough up blood.  You notice blood in vomit, in a bowel movement, or in urine.  You have an oral temperature above 102 F (38.9 C), not controlled by medicine. You may have another PE. A blood clot in the lungs is a medical emergency. Call your local emergency services (911 in [place] to get to the nearest hospital or clinic. Do not drive yourself. MAKE SURE YOU:   Understand these instructions.  Will watch your condition.  Will get help right away if you are not doing well or get worse. Document Released: 03/13/2000 Document Revised: 09/15/2011 Document Reviewed: 09/17/2008 [facility] Patient Information 2014 [place], [place]. Uterine Fibroid A uterine fibroid is a growth (tumor) that occurs in a woman's  uterus. This type of tumor is not cancerous and does not spread out of the uterus. A woman can have one or many fibroids, and the fiboid(s) can become quite large. A fibroid can vary in size, weight, and where it grows in the uterus. Most fibroids do not require medical treatment, but some can cause pain or heavy bleeding during and between periods. CAUSES  A fibroid is the result of a single uterine cell that keeps growing (unregulated), which is different than most cells in the human body. Most cells have a control mechanism that keeps them from reproducing without control.  SYMPTOMS   Bleeding.  Pelvic pain and pressure.  Bladder problems due to the size of the fibroid.  Infertility and miscarriages depending on the size and location of the fibroid. DIAGNOSIS  A diagnosis is made by physical exam. Your caregiver may feel the lumpy tumors during a pelvic exam. Important information regarding size, location, and number of tumors can be gained by having an ultrasound. It is rare that other tests, such as a CT scan or MRI, are needed. TREATMENT   Your caregiver may recommend watchful waiting. This involves getting the fibroid checked by your caregiver to see if the fibroids grow or shrink.   Hormonal treatment or an intrauterine device (IUD) may be prescribed.   Surgery may be needed to remove the fibroids (myomectomy) or the uterus (hysterectomy). This depends on  your situation. When fibroids interfere with fertility and a woman wants to become pregnant, a caregiver may recommend having the fibroids removed.  [place] care depends on how you were treated. In general:   Keep all follow-up appointments with your caregiver.   Only take medicine as told by your caregiver. Do not take aspirin. It can cause bleeding.   If you have excessive periods and soak tampons or pads in a half hour or less, contact your caregiver immediately. If your periods are troublesome but  not so heavy, lie down with your feet raised slightly above your heart. Place cold packs on your lower abdomen.   If your periods are heavy, write down the number of pads or tampons you use per month. Bring this information to your caregiver.   Talk to your caregiver about taking iron pills.   Include green vegetables in your diet.   If you were prescribed a hormonal treatment, take the hormonal medicines as directed.   If you need surgery, ask your caregiver for information on your specific surgery.  SEEK IMMEDIATE MEDICAL CARE IF:  You have pelvic pain or cramps not controlled with medicines.   You have a sudden increase in pelvic pain.   You have an increase of bleeding between and during periods.   You feel lightheaded or have fainting episodes.  MAKE SURE YOU:  Understand these instructions.  Will watch your condition.  Will get help right away if you are not doing well or get worse. Document Released: 03/13/2000 Document Revised: 06/08/2011 Document Reviewed: 10/13/2012 [facility] Patient Information 2014 [place], [place]. Iron Deficiency Anemia There are many types of anemia. Iron deficiency anemia is the most common. Iron deficiency anemia is a decrease in the number of red blood cells caused by too little iron. Without enough iron, your body does not produce enough hemoglobin. Hemoglobin is a substance in red blood cells that carries oxygen to the body's tissues. Iron deficiency anemia may leave you tired and short of breath. CAUSES   Lack of iron in the diet.  This may be seen in infants and children, because there is little iron in milk.  This may be seen in adults who do not eat enough iron-rich foods.  This may be seen in pregnant or breastfeeding women who do not take iron supplements. There is a much higher need for iron intake at these times.  Poor absorption of iron, as seen with intestinal disorders.  Intestinal bleeding.  Heavy  periods. SYMPTOMS  Mild anemia may not be noticeable. Symptoms may include:  Fatigue.  Headache.  Pale skin.  Weakness.  Shortness of breath.  Dizziness.  Cold hands and feet.  Fast or irregular heartbeat. DIAGNOSIS  Diagnosis requires a thorough evaluation and physical exam by your caregiver.  Blood tests are generally used to confirm iron deficiency anemia.  Additional tests may be done to find the underlying cause of your anemia. These may include:  Testing for blood in the stool (fecal occult blood test).  A procedure to see inside the colon and rectum (colonoscopy).  A procedure to see inside the esophagus and stomach (endoscopy). TREATMENT   Correcting the cause of the iron deficiency is the first step.  Medicines, such as oral contraceptives, can make heavy menstrual flows lighter.  Antibiotics and other medicines can be used to treat peptic ulcers.  Surgery may be needed to remove a bleeding polyp, tumor, or fibroid.  Often, iron supplements (ferrous sulfate) are taken.  For the best iron absorption, take these supplements with an empty stomach.  You may need to take the supplements with food if you cannot tolerate them on an empty stomach. Vitamin C improves the absorption of iron. Your caregiver may recommend taking your iron tablets with a glass of orange juice or vitamin C supplement.  Milk and antacids should not be taken at the same time as iron supplements. They may interfere with the absorption of iron.  Iron supplements can cause constipation. A stool softener is often recommended.  Pregnant and breastfeeding women will need to take extra iron, because their normal diet usually will not provide the required amount.  Patients who cannot tolerate iron by mouth can take it through a vein (intravenously) or by an injection into the muscle. HOME CARE INSTRUCTIONS   Ask your dietitian for help with diet questions.  Take iron and vitamins as  directed by your caregiver.  Eat a diet rich in iron. Eat liver, lean beef, whole-grain bread, eggs, dried fruit, and dark green leafy vegetables. SEEK IMMEDIATE MEDICAL CARE IF:   You have a fainting episode. Do not drive yourself. Call your local emergency services (911 in [place] if no other help is available.  You have chest pain, nausea, or vomiting.  You develop severe or increased shortness of breath with activities.  You develop weakness or increased thirst.  You have a rapid heartbeat.  You develop unexplained sweating or become lightheaded when getting up from a chair or bed. MAKE SURE YOU:   Understand these instructions.  Will watch your condition.  Will get help right away if you are not doing well or get worse. Document Released: 03/13/2000 Document Revised: 06/08/2011 Document Reviewed: 07/23/2009 [facility] Patient Information 2014 [place].

## 2013-02-15 NOTE — Progress Notes (Signed)
Checked on new patient with no financial issues. Has not been to [place] --she said she was told she didn't comply with app process, but she did. I printed off all and told her to take back to [facility].

## 2013-02-15 NOTE — Telephone Encounter (Signed)
GAve pt appt for lab ,md December 2014 and pt sent to labs today

## 2013-02-15 NOTE — Telephone Encounter (Signed)
Per staff message and POF I have scheduled appts.  JMW  

## 2013-02-16 ENCOUNTER — Telehealth: Payer: Self-pay | Admitting: Internal Medicine

## 2013-02-16 NOTE — Progress Notes (Signed)
[name]   Fax:([phone]) [phone]  NEW PATIENT EVALUATION   Name: [name] Date: 02/16/2013 MRN: [id number] DOB: [birth year]  PCP: Tiffany Stagers, MD   REFERRING PHYSICIAN: Nena Fitzgerald, [name]*  REASON FOR REFERRAL: DVT/PE, Iron-deficiency anemia    HISTORY OF PRESENT ILLNESS:[name] is a 47 y.o. female who has a history of DVT/PE, and iron-deficiency anemia secondary menorrhagia is here for further evaluation.  Initially, on 10/02/12, she reported increasing left leg discomfort and cramping she described as a "charlie horse" which became progressively worse, particularly on ambulation. Later the same day, she became short of breath, and developed a dry cough, which caused her to feel sore in bilateral lower rib areas.  Dsypnea on exertion worsened and she reported to emergency room for further evaluation.  In the ER, her d-dimer was found to be elevated at 5.89, LE venous doppler demonstrated DVT involving the popliteal vein and posterior tibial veins of the left leg, and CTA showed bilateral pulmonary emboli. She was admitted for 5 days and discharged on 10/07/2012 with lovenox to coumadin bridge.   CTA also revealed a 5 mm pulmonary nodule in the left lower lobe.  She quit smoking about one year ago so a follow-up CT scan was recommended in 6 months. During this admission she also presented with a hemoglobin of 5.7, MCV 62.9.  She reported very heavy menstrual flow.  She received 3 units of pRBC transfusion and was started on ferrous sulfate 325 mg tid.   Patient has no personal or family history of VTE, is not on contraceptive medication, and has no history of recent long-distance travel. Hypercoagulable panel revealed the following: Anti-thrombin 111 at 98, protein C 95, protein C total low at 58, Protein S at 108, PTT lupus anticoagulant at 47, Lupus anticoagulant not detected, B 2 negative, negative for factor V mutation,  negative for prothrombin gene II mutation, anticordiolipine IgG 27.  Today, she reports her menstrual flow is very heavy requiring her to change pads every other hour.  She uses 36 pads over 2 days.  She reports fatigue but she denies chest pain or acute shortness of breath.     PAST MEDICAL HISTORY:  has a past medical history of Medical history non-contributory; Anemia; DVT (deep venous thrombosis); and PE (pulmonary embolism).     PAST SURGICAL HISTORY: Past Surgical History  Procedure Laterality Date  . Polypectomy  2008    Removal of uterine polyp  . Cesarean section       CURRENT MEDICATIONS: has a current medication list which includes the following prescription(s): clindamycin, ferrous sulfate, megestrol, and warfarin.   ALLERGIES: Ampicillin and Sulfa antibiotics   SOCIAL HISTORY:  reports that she quit smoking about 16 months ago. She has never used smokeless tobacco. She reports that she drinks alcohol. She reports that she does not use illicit drugs.   FAMILY HISTORY: family history includes Hypertension in her father and mother; Kidney disease in her mother; Stroke in her sister.   LABORATORY DATA:  CBC    Component Value Date/Time   WBC 8.2 02/15/2013 1251   WBC 6.8 11/21/2012 2011   RBC 4.36 02/15/2013 1251   RBC 4.48 11/21/2012 2011   RBC 3.41* 10/04/2012 1827   HGB 11.6 02/15/2013 1251   HGB 12.2 11/21/2012 2019   HCT 36.2 02/15/2013 1251   HCT 36.0 11/21/2012 2019   PLT 326 02/15/2013 1251   PLT 267 11/21/2012 2011   MCV 83.0  02/15/2013 1251   MCV 79.2 11/21/2012 2011   MCH 26.6 02/15/2013 1251   MCH 25.4* 11/21/2012 2011   MCHC 32.1 02/15/2013 1251   MCHC 32.1 11/21/2012 2011   RDW 15.6* 02/15/2013 1251   RDW 25.0* 11/21/2012 2011   LYMPHSABS 1.5 02/15/2013 1251   LYMPHSABS 1.6 11/21/2012 2011   MONOABS 0.6 02/15/2013 1251   MONOABS 0.3 11/21/2012 2011   EOSABS 0.1 02/15/2013 1251   EOSABS 0.1 11/21/2012 2011   BASOSABS 0.1 02/15/2013 1251   BASOSABS 0.0  11/21/2012 2011   CMP     Component Value Date/Time   NA 141 11/21/2012 2019   K 3.8 11/21/2012 2019   CL 104 11/21/2012 2019   CO2 27 10/06/2012 0447   GLUCOSE 156* 11/21/2012 2019   BUN 9 11/21/2012 2019   CREATININE 0.90 11/21/2012 2019   CALCIUM 8.7 10/06/2012 0447   PROT 7.2 07/24/2007 2345   ALBUMIN 3.3* 07/24/2007 2345   AST 18 07/24/2007 2345   ALT 14 07/24/2007 2345   ALKPHOS 71 07/24/2007 2345   BILITOT 0.3 07/24/2007 2345   GFRNONAA >90 10/06/2012 0447   GFRAA >90 10/06/2012 0447    Results for [name] (MRN [id number]) as of 02/16/2013 11:04  Ref. Range 02/15/2013 12:51  Iron Latest Range: 42-135 ug/dL 26 (L)  UIBC Latest Range: 125-400 ug/dL 490 (H)  TIBC Latest Range: 250-470 ug/dL 516 (H)  %SAT Latest Range: 21-57 % 5 (L)  Ferritin Latest Range: 10-291 ng/mL 12   Results for [name] (MRN [id number]) as of 02/16/2013 11:04  Ref. Range 10/04/2012 18:25  Anticardiolipin IgA Latest Range: <22 APL U/mL 13 (L)  Anticardiolipin IgG Latest Range: <23 GPL U/mL 27 (H)  Anticardiolipin IgM Latest Range: <11 MPL U/mL 4 (L)  PTT Lupus Anticoagulant Latest Range: 28.0-43.0 secs 47.4 (H)  PTTLA Confirmation Latest Range: <8.0 secs NOT APPL  PTTLA 4:1 Mix Latest Range: 28.0-43.0 secs 36.7  DRVVT Latest Range: <42.9 secs 27.8  Drvvt confirmation Latest Range: <1.11 Ratio NOT APPL  dRVVT Incubated 1:1 Mix Latest Range: <42.9 secs NOT APPL  Lupus Anticoagulant Latest Range: NOT DETECTED  NOT DETECTED  Beta-2 Glyco I IgG Latest Range: <20 G Units 5  Beta-2-Glycoprotein I IgA Latest Range: <20 A Units 0  Beta-2-Glycoprotein I IgM Latest Range: <20 M Units 13  AntiThromb III Func Latest Range: 75-120 % 98  Recommendations-F5LEID: No range found (NOTE)  Recommendations-PTGENE: No range found (NOTE)  Protein C Activity Latest Range: 75-133 % 95  Protein C, Total Latest Range: 72-160 % 58 (L)  Protein S Activity Latest Range: 69-129 % 108  Protein S Ag, Total Latest Range: 60-150  % 117  Prothrombin Time Latest Range: 11.6-15.2 seconds 14.4  INR Latest Range: 0.00-1.49  1.14   RADIOGRAPHY: US Soft Tissue Head/neck  02/10/2013   CLINICAL DATA:  Thyromegaly.  EXAM: THYROID ULTRASOUND  TECHNIQUE: Ultrasound examination of the thyroid gland and adjacent soft tissues was performed.  COMPARISON:  None.  FINDINGS: Right thyroid lobe  Measurements: 5.2 x 2.2 x 1.5 cm. Three hypoechoic nodules are noted in the right thyroid lobe, with the largest measuring 9 x 7 x 6 mm in the interpolar region.  Left thyroid lobe  Measurements: 4.9 x 1.9 x 1.3 cm. Two hypoechoic nodules are noted in the inferior pole of the left thyroid lobe, with the largest measuring 7 x 7 x 5 mm.  Isthmus  Thickness: 3.5 mm. Solid nodule is noted in the right side of the  isthmus measuring 5 x 4 x 3 mm.  Lymphadenopathy  Left submandibular lymph node is noted with minor axis of 12 mm. Enlarged right submandibular lymph node is noted with minor axis of 15 mm.  IMPRESSION: Multiple subcentimeter nodules are seen bilaterally. No dominant nodule or mass is noted. Mildly enlarged lymph nodes are noted in the submandibular region bilaterally.   Electronically Signed   By: Sabino Dick M.D.   On: 02/10/2013 08:38   10/18/2012  TRANSABDOMINAL AND TRANSVAGINAL ULTRASOUND OF PELVIS Technique: Both transabdominal and transvaginal ultrasound examinations of the pelvis were performed. Transabdominal technique was performed for global imaging of the pelvis including uterus, ovaries, adnexal regions, and pelvic cul-de-sac. It was necessary to proceed with endovaginal exam following the transabdominal exam to visualize the myometrium, endometrium and adnexa. Comparison: 09/25/2008 Findings:  Uterus: Is anteverted and anteflexed and enlarged with a sagittal length of 15.6 cm, depth of 8.1 cm and width of 11.1 cm. Measurements and evaluation are best performed transabdominally due to the large uterine size resulting in poor assessment on  endovaginal exam. A focal fibroid is identified in the posterior lower uterine segment measuring 4.7 x 4.1 x 3.8 cm. This deviates the endometrial canal anteriorly compatible with a partial submucosal component. More diffuse fibroid involvement may be  present but no other discrete fibroids are identified transabdominally. Given the overall large uterine size and myometrial heterogeneity on the transabdominal evaluation, the presence of underlying adenomyosis should be considered Endometrium: Is best seen transabdominally and has a tri-layered pattern with no definite focal thickening or heterogeneity. Evaluation endovaginally is compromised by the large uterine size. The tri-layered pattern would correlate with a periovulatory endometrium and correspond with the provided LMP is 10/18/2012 Right ovary: Is not seen either transabdominally or endovaginally Left ovary: Measures 4.2 x 3.6 x 3.2 cm, is only seen  transabdominally and contains a cystic structure measuring 2.5 x 3.5 x 2.6 cm. While this is incompletely characterized on the transabdominal exam, no overtly worrisome features are seen in this may represent a dominant follicle. Other findings: No pelvic fluid or separate adnexal masses are seen. IMPRESSION: Overall compromised evaluation due to the large uterine size resulting in good imaging of the uterus and endometrium only transabdominally. Increased uterine size compared with prior exam in 2010. Focal fibroid with partial submucosal component is  suggested. Findings raise the possibility of underlying adenomyosis versus more diffuse unrecognized fibroid involvement given the large uterine size. If further evaluation is desired, pelvic MRI would be recommended. Right ovarian cyst, incompletely characterized on transabdominal evaluation but with no overtly worrisome features identified on transabdominal exam.  10/04/2012  CT ANGIOGRAPHY CHEST Technique: Multidetector CT imaging of the chest using the    standard protocol during bolus administration of intravenous contrast. Multiplanar reconstructed images including MIPs were obtained and reviewed to evaluate the vascular anatomy. Contrast: 148m OMNIPAQUE IOHEXOL 350 MG/ML SOLN Comparison: None. Findings: There is no pleural effusion. Pulmonary nodule in the left lower lobe measures 5 mm, image 40/series 12. There is no airspace consolidation or atelectasis identified. The heart size is normal. No pericardial effusion. There is no  mediastinal or hilar adenopathy. There are bilateral pulmonary artery filling defects. These predominately involve the lower lobe pulmonary arteries. Limited imaging through the upper abdomen is unremarkable.  Review of the visualized osseous structures is unremarkable. IMPRESSION: 1. Exam is positive for bilateral pulmonary emboli. 2. Pulmonary nodule in the left lower lobe measures 5 mm. If the patient is at high risk for  bronchogenic carcinoma, follow-up chest CT at 6-12 months is recommended. If the patient is at low risk for bronchogenic carcinoma, follow-up chest CT at 12 months is recommended. This recommendation follows the consensus statement: Guidelines for Management of Small Pulmonary Nodules Detected on CT Scans: A Statement from the [employer] as published in  Radiology 2005; 237:395-400.  PATHOLOGY: 10/13/2012. Diagnosis Endometrium, biopsy - BENIGN POLYPOID PROLIFERATIVE PHASE ENDOMETRIUM WITH BREAKDOWN. - NEGATIVE FOR HYPERPLASIA OR MALIGNANCY. Mali RUND DO Pathologist, Electronic Signature (Case signed 10/17/2012)   REVIEW OF SYSTEMS:  Constitutional: Denies fevers, chills or abnormal weight loss Eyes: Denies blurriness of vision Ears, nose, mouth, throat, and face: Denies mucositis or sore throat Respiratory: Denies cough, dyspnea or wheezes Cardiovascular: Denies palpitation, chest discomfort or lower extremity swelling Gastrointestinal:  Denies nausea, heartburn or change in bowel  habits Skin: Denies abnormal skin rashes Lymphatics: Denies new lymphadenopathy or easy bruising Neurological:Denies numbness, tingling or new weaknesses Behavioral/Psych: Mood is stable, no new changes  All other systems were reviewed with the patient and are negative.  PHYSICAL EXAM:  height is _0  (1.651 m) and weight is 252 lb 11.2 oz (114.624 kg). Her oral temperature is 98.5 F (36.9 C). Her blood pressure is 156/95 and her pulse is 80. Her respiration is 20.    GENERAL:alert, no distress and comfortable; Obese.   SKIN: skin color, texture, turgor are normal, no rashes or significant lesions EYES: normal, Conjunctiva are pink and non-injected, sclera clear OROPHARYNX:no exudate, no erythema and lips, buccal mucosa, and tongue normal  NECK: supple, thyroid normal size, non-tender, without nodularity LYMPH:  no palpable lymphadenopathy in the cervical, axillary or inguinal LUNGS: clear to auscultation and percussion with normal breathing effort HEART: regular rate & rhythm and no murmurs and no lower extremity edema ABDOMEN:abdomen soft, non-tender and normal bowel sounds Musculoskeletal:no cyanosis of digits and no clubbing  NEURO: alert & oriented x 3 with fluent speech, no focal motor/sensory deficits   IMPRESSION: [name] is a 47 y.o. female with a history of    PLAN: 1.  Iron-defiency anemia secondary to menorrhagia. --Her iron studies does indicate iron deficiency anemia with low ferritin and low iron levels and a high TIBC. Her hemoglobin remains mildly decreased at 11.6 with a hematocrit of 36.2.  Her white blood cell count is normal with 8.2 and platelets are normal at 326. Her MCV for her complete blood count is 83 with a high RDW. This is indication of iron-defiency anemia.  We discussed symptoms of anemia include fatigue, palpitations.  She denies picca presently but reported it doing her hospitalization in July.  She will continue ferrous sulfate 325 mg tid.  She  denies constipation.  She will continue to address the underlying cause of her anemia, #5 with her gynecologist Dr. Harolyn Rutherford. They are considering a partial hysterectomy versus ablation.   2. DVT/PE.  --She will need repeat hypercoagulable panel when off anticoagulation. Prior hypercoagulable panel revealed the following: Anti-thrombin 111 at 98, protein C 95, protein C total low at 58, Protein S at 108, PTT lupus anticoagulant at 47, Lupus anticoagulant not detected, B 2 negative, negative for factor V mutation, negative for prothrombin gene II mutation, anticordiolipine IgG 27.  She denies a history of recurrent clots or pregnancy losses or positive family history of blood clots.  --She will likely need 6 months of anti-coagulation, ending April 06, 2013.    Her present risk factor include current megace use for #1.  It is pro-thrombotic.  We discussed the risk of future clots because of this medication must be balanced with risk of further bleeding.  Her bleeding risk is as noted above in #1. She does not have any additional risk factors for increased bleeding including age greater than 36, poor A/C control, prior bleeding episodes on anti-coagulation, renal failure, anemia.   3. Obesity, moderate.  -- Patient counseled on continued weight lost and dieting and exercise.    4. Thyroid nodules. -- follow up with Endocrinology.  TSH, Free T4.    5. Uterine Fibroid.  --Followed by Dr. Harolyn Rutherford.  She underwent an endometrial biopsy on 10/13/2012 with the above findings, negative for malignancy. She has a history of uterine myomectomy approximately 7 years ago.    6. Pulmonary nodule. --CTA also revealed a 5 mm pulmonary nodule in the left lower lobe.  She quit smoking about one year ago so a follow-up CT scan was recommended in 6 months. --Denies any constitutional symptoms such as weight loss fevers or night sweats.   7. Follow-up. --She has been instructed to follow up in our offices in one month  with labs for iron and CBC, Chemistries.  If ferritin less than 15, we will provide intravenous feraheme.    All questions were answered. The patient knows to call the clinic with any problems, questions or concerns. We can certainly see the patient much sooner if necessary.  I spent 40 minutes counseling the patient face to face. The total time spent in the appointment was 60 minutes.    Othar Curto, MD 02/16/2013 10:53 AM

## 2013-02-16 NOTE — Telephone Encounter (Signed)
talked to pt gave her appt on 11/25th

## 2013-02-17 ENCOUNTER — Other Ambulatory Visit: Payer: Self-pay | Admitting: Internal Medicine

## 2013-02-21 ENCOUNTER — Ambulatory Visit: Payer: BC Managed Care – PPO

## 2013-03-15 ENCOUNTER — Encounter: Payer: BC Managed Care – PPO | Attending: Internal Medicine

## 2013-03-17 ENCOUNTER — Other Ambulatory Visit: Payer: BC Managed Care – PPO

## 2013-03-17 ENCOUNTER — Ambulatory Visit: Payer: BC Managed Care – PPO

## 2013-03-21 ENCOUNTER — Ambulatory Visit: Payer: BC Managed Care – PPO

## 2013-04-19 ENCOUNTER — Ambulatory Visit: Payer: BC Managed Care – PPO

## 2013-04-26 ENCOUNTER — Telehealth: Payer: Self-pay | Admitting: Internal Medicine

## 2013-04-26 NOTE — Telephone Encounter (Signed)
s.w. pt and r/s missed appt per pt request...pt ok and aware

## 2013-05-02 ENCOUNTER — Other Ambulatory Visit: Payer: Self-pay | Admitting: Obstetrics and Gynecology

## 2013-05-02 DIAGNOSIS — N920 Excessive and frequent menstruation with regular cycle: Secondary | ICD-10-CM

## 2013-05-02 DIAGNOSIS — D259 Leiomyoma of uterus, unspecified: Secondary | ICD-10-CM

## 2013-05-03 ENCOUNTER — Other Ambulatory Visit: Payer: Self-pay

## 2013-05-03 ENCOUNTER — Ambulatory Visit: Payer: 59

## 2013-05-03 ENCOUNTER — Telehealth: Payer: Self-pay | Admitting: Internal Medicine

## 2013-05-03 ENCOUNTER — Other Ambulatory Visit: Payer: Self-pay | Admitting: Internal Medicine

## 2013-05-03 ENCOUNTER — Other Ambulatory Visit (HOSPITAL_BASED_OUTPATIENT_CLINIC_OR_DEPARTMENT_OTHER): Payer: 59

## 2013-05-03 DIAGNOSIS — D649 Anemia, unspecified: Secondary | ICD-10-CM

## 2013-05-03 DIAGNOSIS — D500 Iron deficiency anemia secondary to blood loss (chronic): Secondary | ICD-10-CM

## 2013-05-03 DIAGNOSIS — D219 Benign neoplasm of connective and other soft tissue, unspecified: Secondary | ICD-10-CM

## 2013-05-03 LAB — CBC WITH DIFFERENTIAL/PLATELET
BASO%: 0.4 % (ref 0.0–2.0)
Basophils Absolute: 0 10e3/uL (ref 0.0–0.1)
EOS%: 1.3 % (ref 0.0–7.0)
Eosinophils Absolute: 0.1 10e3/uL (ref 0.0–0.5)
HCT: 31 % — ABNORMAL LOW (ref 34.8–46.6)
HGB: 9.2 g/dL — ABNORMAL LOW (ref 11.6–15.9)
LYMPH%: 25.5 % (ref 14.0–49.7)
MCH: 24.2 pg — ABNORMAL LOW (ref 25.1–34.0)
MCHC: 29.7 g/dL — ABNORMAL LOW (ref 31.5–36.0)
MCV: 81.6 fL (ref 79.5–101.0)
MONO#: 0.5 10e3/uL (ref 0.1–0.9)
MONO%: 6.9 % (ref 0.0–14.0)
NEUT#: 5 10e3/uL (ref 1.5–6.5)
NEUT%: 65.9 % (ref 38.4–76.8)
Platelets: 253 10e3/uL (ref 145–400)
RBC: 3.8 10e6/uL (ref 3.70–5.45)
RDW: 16.9 % — ABNORMAL HIGH (ref 11.2–14.5)
WBC: 7.6 10e3/uL (ref 3.9–10.3)
lymph#: 1.9 10e3/uL (ref 0.9–3.3)
nRBC: 1 % — ABNORMAL HIGH (ref 0–0)

## 2013-05-03 LAB — BASIC METABOLIC PANEL (CC13)
ANION GAP: 9 meq/L (ref 3–11)
BUN: 7.5 mg/dL (ref 7.0–26.0)
CALCIUM: 9.6 mg/dL (ref 8.4–10.4)
CO2: 28 mEq/L (ref 22–29)
Chloride: 104 mEq/L (ref 98–109)
Creatinine: 0.8 mg/dL (ref 0.6–1.1)
GLUCOSE: 168 mg/dL — AB (ref 70–140)
POTASSIUM: 3.7 meq/L (ref 3.5–5.1)
SODIUM: 140 meq/L (ref 136–145)

## 2013-05-03 LAB — IRON AND TIBC CHCC
%SAT: 4 % — ABNORMAL LOW (ref 21–57)
Iron: 21 ug/dL — ABNORMAL LOW (ref 41–142)
TIBC: 550 ug/dL — ABNORMAL HIGH (ref 236–444)
UIBC: 529 ug/dL — ABNORMAL HIGH (ref 120–384)

## 2013-05-03 LAB — FERRITIN CHCC: FERRITIN: 8 ng/mL — AB (ref 9–269)

## 2013-05-03 NOTE — Telephone Encounter (Signed)
Pt came today and r/s appt to 2/11 MD only labs drwan today

## 2013-05-04 ENCOUNTER — Telehealth: Payer: Self-pay | Admitting: Internal Medicine

## 2013-05-04 ENCOUNTER — Telehealth: Payer: Self-pay | Admitting: *Deleted

## 2013-05-04 NOTE — Telephone Encounter (Signed)
[name]

## 2013-05-04 NOTE — Telephone Encounter (Signed)
Per staff message and POF I have scheduled appts.  JMW

## 2013-05-09 ENCOUNTER — Ambulatory Visit (HOSPITAL_BASED_OUTPATIENT_CLINIC_OR_DEPARTMENT_OTHER): Payer: 59 | Admitting: Internal Medicine

## 2013-05-09 ENCOUNTER — Ambulatory Visit (HOSPITAL_BASED_OUTPATIENT_CLINIC_OR_DEPARTMENT_OTHER): Payer: 59

## 2013-05-09 VITALS — BP 135/89 | HR 92 | Temp 97.9°F | Resp 19

## 2013-05-09 VITALS — BP 170/97 | HR 94 | Temp 97.7°F | Resp 19 | Ht 65.0 in | Wt 258.3 lb

## 2013-05-09 DIAGNOSIS — N920 Excessive and frequent menstruation with regular cycle: Secondary | ICD-10-CM

## 2013-05-09 DIAGNOSIS — D259 Leiomyoma of uterus, unspecified: Secondary | ICD-10-CM

## 2013-05-09 DIAGNOSIS — I749 Embolism and thrombosis of unspecified artery: Secondary | ICD-10-CM

## 2013-05-09 DIAGNOSIS — I8290 Acute embolism and thrombosis of unspecified vein: Secondary | ICD-10-CM

## 2013-05-09 DIAGNOSIS — D649 Anemia, unspecified: Secondary | ICD-10-CM

## 2013-05-09 DIAGNOSIS — R911 Solitary pulmonary nodule: Secondary | ICD-10-CM

## 2013-05-09 DIAGNOSIS — D219 Benign neoplasm of connective and other soft tissue, unspecified: Secondary | ICD-10-CM

## 2013-05-09 DIAGNOSIS — Z86718 Personal history of other venous thrombosis and embolism: Secondary | ICD-10-CM

## 2013-05-09 DIAGNOSIS — D500 Iron deficiency anemia secondary to blood loss (chronic): Secondary | ICD-10-CM

## 2013-05-09 DIAGNOSIS — N939 Abnormal uterine and vaginal bleeding, unspecified: Secondary | ICD-10-CM

## 2013-05-09 MED ORDER — SODIUM CHLORIDE 0.9 % IV SOLN
1020.0000 mg | Freq: Once | INTRAVENOUS | Status: AC
  Administered 2013-05-09: 1020 mg via INTRAVENOUS
  Filled 2013-05-09: qty 34

## 2013-05-09 MED ORDER — SODIUM CHLORIDE 0.9 % IV SOLN
Freq: Once | INTRAVENOUS | Status: AC
  Administered 2013-05-09: 17:00:00 via INTRAVENOUS

## 2013-05-09 NOTE — Patient Instructions (Signed)

## 2013-05-10 ENCOUNTER — Ambulatory Visit (HOSPITAL_BASED_OUTPATIENT_CLINIC_OR_DEPARTMENT_OTHER): Payer: 59

## 2013-05-10 ENCOUNTER — Telehealth: Payer: Self-pay | Admitting: Internal Medicine

## 2013-05-10 ENCOUNTER — Inpatient Hospital Stay
Admission: RE | Admit: 2013-05-10 | Discharge: 2013-05-10 | Disposition: A | Discharge status: Home / Self Care | Payer: BC Managed Care – PPO | Source: Ambulatory Visit | Attending: Obstetrics and Gynecology | Admitting: Obstetrics and Gynecology

## 2013-05-10 ENCOUNTER — Encounter: Payer: Self-pay | Admitting: Internal Medicine

## 2013-05-10 DIAGNOSIS — Z86718 Personal history of other venous thrombosis and embolism: Secondary | ICD-10-CM

## 2013-05-10 NOTE — Progress Notes (Signed)
[facility] OFFICE PROGRESS NOTE  Nena Jordan, IBTEHAL, MD [place]  [address] [place] [address]  DIAGNOSIS: Iron deficiency anemia secondary to blood loss (chronic) - Plan: CBC with Differential, Ferritin, Iron and TIBC, CBC with Differential, Ferritin, Iron and TIBC  Personal history of venous thrombosis and embolism - Plan: Hypercoagulable panel, comprehensive  Abnormal uterine bleeding  Anemia  Fibroids  Pulmonary nodule  VTE (venous thromboembolism)  Chief Complaint  Patient presents with  . Iron defiency anemia    CURRENT THERAPY: Ferrous sulfate 325 mg tid.   INTERVAL HISTORY: [name] 48 y.o. female with a history of DVT/PE, and iron-deficiency anemia secondary menorrhagia is here for follow-up.  She was last seen by me on 02/15/2013.   Initially, on 10/02/12, she reported increasing left leg discomfort and cramping she described as a "charlie horse" which became progressively worse, particularly on ambulation. Later the same day, she became short of breath, and developed a dry cough, which caused her to feel sore in bilateral lower rib areas. Dyspnea on exertion worsened and she reported to emergency room for further evaluation. In the ER, her d-dimer was found to be elevated at 5.89, LE venous doppler demonstrated DVT involving the popliteal vein and posterior tibial veins of the left leg, and CTA showed bilateral pulmonary emboli. She was admitted for 5 days and discharged on 10/07/2012 with lovenox to coumadin bridge. CTA also revealed a 5 mm pulmonary nodule in the left lower lobe. She quit smoking about one year ago so a follow-up CT scan was recommended in 6 months. During this admission she also presented with a hemoglobin of 5.7, MCV 62.9. She reported very heavy menstrual flow. She received 3 units of pRBC transfusion and was started on ferrous sulfate 325 mg tid.   Patient has no personal or family history of VTE, is not on  contraceptive medication, and reported no history of recent long-distance travel. Hypercoagulable panel revealed the following: Anti-thrombin 111 at 98, protein C 95, protein C total low at 58, Protein S at 108, PTT lupus anticoagulant at 47, Lupus anticoagulant not detected, B 2 negative, negative for factor V mutation, negative for prothrombin gene II mutation, anticordiolipine IgG 27.   Today, she reports her menstrual flow is very heavy requiring her to change pads every other hour. She uses 36 pads over 2 days. She reports fatigue but she denies chest pain or acute shortness of breath. Her menstrual period started two days ago.  She reports compliance to bid ferrous sulfate.  She reports that she gain 6 lbs (258 up from 252 lb) since her last visit.  She has gained a lot of weight while on megace.  She has been off Megace.   She has also completed her six-month warfarin therapy.        MEDICAL HISTORY: Past Medical History  Diagnosis Date  . Medical history non-contributory   . Anemia   . DVT (deep venous thrombosis)   . PE (pulmonary embolism)     INTERIM HISTORY: has VTE (venous thromboembolism); Pulmonary nodule; Hypokalemia; Abnormal uterine bleeding; Fibroids; and Anemia on her problem list.    ALLERGIES:  is allergic to ampicillin and sulfa antibiotics.  MEDICATIONS: has a current medication list which includes the following prescription(s): acetaminophen, ferrous sulfate, and megestrol.  SURGICAL HISTORY:  Past Surgical History  Procedure Laterality Date  . Polypectomy  2008    Removal of uterine polyp  . Cesarean section  REVIEW OF SYSTEMS:   Constitutional: Denies fevers, chills or abnormal weight loss but does report 6 lbs weight gain Eyes: Denies blurriness of vision Ears, nose, mouth, throat, and face: Denies mucositis or sore throat Respiratory: Denies cough, dyspnea or wheezes Cardiovascular: Denies palpitation, chest discomfort or lower extremity  swelling Gastrointestinal:  Denies nausea, heartburn or change in bowel habits Skin: Denies abnormal skin rashes Lymphatics: Denies new lymphadenopathy or easy bruising Neurological:Denies numbness, tingling or new weaknesses Behavioral/Psych: Mood is stable, no new changes  All other systems were reviewed with the patient and are negative.  PHYSICAL EXAMINATION: ECOG PERFORMANCE STATUS: 0 - Asymptomatic  Blood pressure 170/97, pulse 94, temperature 97.7 F (36.5 C), temperature source Oral, resp. rate 19, height _0  (1.651 m), weight 258 lb 4.8 oz (117.164 kg).  GENERAL:alert, no distress and comfortable well developed and well nourished morbidly obese SKIN: skin color, texture, turgor are normal, no rashes or significant lesions EYES: normal, Conjunctiva are pink and non-injected, sclera clear OROPHARYNX:no exudate, no erythema and lips, buccal mucosa, and tongue normal  NECK: supple, thyroid normal size, non-tender, without nodularity LYMPH:  no palpable lymphadenopathy in the cervical, axillary or supraclavicular LUNGS: clear to auscultation and percussion with normal breathing effort HEART: regular rate & rhythm and no murmurs and no lower extremity edema ABDOMEN:abdomen soft, non-tender and normal bowel sounds Musculoskeletal:no cyanosis of digits and no clubbing  NEURO: alert & oriented x 3 with fluent speech, no focal motor/sensory deficits  Labs:  Lab Results  Component Value Date   WBC 7.6 05/03/2013   HGB 9.2* 05/03/2013   HCT 31.0* 05/03/2013   MCV 81.6 05/03/2013   PLT 253 05/03/2013   NEUTROABS 5.0 05/03/2013      Chemistry      Component Value Date/Time   NA 140 05/03/2013 1103   NA 141 11/21/2012 2019   K 3.7 05/03/2013 1103   K 3.8 11/21/2012 2019   CL 104 11/21/2012 2019   CO2 28 05/03/2013 1103   CO2 27 10/06/2012 0447   BUN 7.5 05/03/2013 1103   BUN 9 11/21/2012 2019   CREATININE 0.8 05/03/2013 1103   CREATININE 0.90 11/21/2012 2019      Component Value Date/Time    CALCIUM 9.6 05/03/2013 1103   CALCIUM 8.7 10/06/2012 0447   ALKPHOS 71 07/24/2007 2345   AST 18 07/24/2007 2345   ALT 14 07/24/2007 2345   BILITOT 0.3 07/24/2007 2345      Basic Metabolic Panel:  Recent Labs Lab 05/03/13 1103  NA 140  K 3.7  CO2 28  GLUCOSE 168*  BUN 7.5  CREATININE 0.8  CALCIUM 9.6   GFR Estimated Creatinine Clearance: 111.3 ml/min (by C-G formula based on Cr of 0.8).  CBC:  Recent Labs Lab 05/03/13 1102  WBC 7.6  NEUTROABS 5.0  HGB 9.2*  HCT 31.0*  MCV 81.6  PLT 253   Iron/TIBC/Ferritin    Component Value Date/Time   IRON 21* 05/03/2013 1102   IRON 14* 10/04/2012 1827   TIBC 550* 05/03/2013 1102   TIBC 551* 10/04/2012 1827   FERRITIN 8* 05/03/2013 1102   FERRITIN 3* 10/04/2012 1827   Studies:  No results found.   RADIOGRAPHIC STUDIES: No results found.  ASSESSMENT: [name] 48 y.o. female with a history of Iron deficiency anemia secondary to blood loss (chronic) - Plan: CBC with Differential, Ferritin, Iron and TIBC, CBC with Differential, Ferritin, Iron and TIBC  Personal history of venous thrombosis and embolism - Plan: Hypercoagulable panel,  comprehensive  Abnormal uterine bleeding  Anemia  Fibroids  Pulmonary nodule  VTE (venous thromboembolism)   PLAN:  1. Iron-defiency anemia secondary to menorrhagia.  --Her iron studies does indicate iron deficiency anemia with low ferritin and low iron levels and a high TIBC. We will give feraheme 1,020 mg intravenous today.  Repeat labs including iron studies in one month.   She is being considered for hysterectomy or other ablations given the severity of her anemia by her gynecologist Dr. Harolyn Rutherford.   2. DVT/PE.  --She will need repeat hypercoagulable panel when off anticoagulation. Ordered for tomorrow.  Prior hypercoagulable panel revealed the following: Anti-thrombin 111 at 98, protein C 95, protein C total low at 58, Protein S at 108, PTT lupus anticoagulant at 47, Lupus anticoagulant not  detected, B 2 negative, negative for factor V mutation, negative for prothrombin gene II mutation, anticordiolipine IgG 27. She denies a history of recurrent clots or pregnancy losses or positive family history of blood clots.  --She completed 6 months of anti-coagulation, ending April 06, 2013. She is off megace which is a prothrombic.   3. Obesity, moderate.  -- Patient counseled on continued weight lost and dieting and exercise. Her glucose and blood pressure is also elevated, concerning for metabolic syndrome.    4. Thyroid nodules.  -- follow up with Endocrinology. TSH, Free T4.   5. Uterine Fibroid.  --Followed by Dr. Harolyn Rutherford. She underwent an endometrial biopsy on 10/13/2012 with the above findings, negative for malignancy. She has a history of uterine myomectomy approximately 7 years ago.   6. Pulmonary nodule.  --CTA also revealed a 5 mm pulmonary nodule in the left lower lobe. She quit smoking about one year ago so a follow-up CT scan was recommended in 6 months. We ordered CT of chest without contrast.   She denies any constitutional symptoms such as weight loss fevers or night sweats.   7. Follow-up.  --She has been instructed to follow up in our offices in two months with labs for iron and CBC, Chemistries. If ferritin less than 15, we will provide intravenous feraheme.  She will have iron studies in one month.  All questions were answered. The patient knows to call the clinic with any problems, questions or concerns. We can certainly see the patient much sooner if necessary.  I spent 15 minutes counseling the patient face to face. The total time spent in the appointment was 25 minutes.    Michala Deblanc, MD 05/10/2013 5:45 AM

## 2013-05-10 NOTE — Telephone Encounter (Signed)
, °

## 2013-05-10 NOTE — Telephone Encounter (Signed)
pt came to get appts...pt needed morning appts...done

## 2013-05-10 NOTE — Progress Notes (Signed)
Patient is having ct head/neck and chest; auth's are [id number](TNBZ), [id number] (chest), [id number] (head) 05/10/13-06/24/13.

## 2013-05-11 ENCOUNTER — Ambulatory Visit (HOSPITAL_COMMUNITY)
Admission: RE | Admit: 2013-05-11 | Discharge: 2013-05-11 | Disposition: A | Discharge status: Home / Self Care | Payer: 59 | Source: Ambulatory Visit | Attending: Internal Medicine | Admitting: Internal Medicine

## 2013-05-11 ENCOUNTER — Telehealth: Payer: Self-pay | Admitting: Internal Medicine

## 2013-05-11 DIAGNOSIS — F17200 Nicotine dependence, unspecified, uncomplicated: Secondary | ICD-10-CM | POA: Insufficient documentation

## 2013-05-11 DIAGNOSIS — R911 Solitary pulmonary nodule: Secondary | ICD-10-CM | POA: Insufficient documentation

## 2013-05-11 NOTE — Telephone Encounter (Signed)
Reported CT of chest without contrast was stable and slightly decreased in lung nodule.  We will await her hypercoagulable work-up.

## 2013-05-12 ENCOUNTER — Telehealth: Payer: Self-pay | Admitting: Internal Medicine

## 2013-05-12 LAB — HYPERCOAGULABLE PANEL, COMPREHENSIVE
ANTICARDIOLIPIN IGG: 13 GPL U/mL (ref <23)
ANTICARDIOLIPIN IGM: 2 [MPL'U]/mL (ref <11)
ANTITHROMB III FUNC: 129 % — AB (ref 76–126)
Anticardiolipin IgA: 7 APL U/mL (ref <22)
BETA 2 GLYCO I IGG: 4 G Units (ref <20)
BETA-2-GLYCOPROTEIN I IGM: 14 M Units (ref <20)
Beta-2-Glycoprotein I IgA: 9 A Units (ref <20)
DRVVT: 25.7 s (ref <42.9)
Lupus Anticoagulant: NOT DETECTED
PROTEIN C, TOTAL: 76 % (ref 72–160)
PROTEIN S ACTIVITY: 111 % (ref 69–129)
PROTEIN S TOTAL: 152 % — AB (ref 60–150)
PTT LA: 27.6 s — AB (ref 28.0–43.0)
Protein C Activity: 78 % (ref 75–133)

## 2013-05-12 NOTE — Telephone Encounter (Signed)
Left message reporting labs for hypercoagulable labs were negative.

## 2013-05-14 ENCOUNTER — Emergency Department (HOSPITAL_COMMUNITY): Payer: 59

## 2013-05-14 ENCOUNTER — Observation Stay (HOSPITAL_COMMUNITY)
Admission: EM | Admit: 2013-05-14 | Discharge: 2013-05-16 | Disposition: A | Discharge status: Home / Self Care | Payer: 59 | Attending: Internal Medicine | Admitting: Internal Medicine

## 2013-05-14 ENCOUNTER — Encounter (HOSPITAL_COMMUNITY): Payer: Self-pay | Admitting: Emergency Medicine

## 2013-05-14 DIAGNOSIS — I8290 Acute embolism and thrombosis of unspecified vein: Secondary | ICD-10-CM

## 2013-05-14 DIAGNOSIS — R0602 Shortness of breath: Secondary | ICD-10-CM | POA: Insufficient documentation

## 2013-05-14 DIAGNOSIS — Z86711 Personal history of pulmonary embolism: Secondary | ICD-10-CM | POA: Insufficient documentation

## 2013-05-14 DIAGNOSIS — D62 Acute posthemorrhagic anemia: Principal | ICD-10-CM

## 2013-05-14 DIAGNOSIS — Z86718 Personal history of other venous thrombosis and embolism: Secondary | ICD-10-CM | POA: Insufficient documentation

## 2013-05-14 DIAGNOSIS — R5383 Other fatigue: Secondary | ICD-10-CM

## 2013-05-14 DIAGNOSIS — D259 Leiomyoma of uterus, unspecified: Secondary | ICD-10-CM | POA: Insufficient documentation

## 2013-05-14 DIAGNOSIS — N920 Excessive and frequent menstruation with regular cycle: Secondary | ICD-10-CM | POA: Insufficient documentation

## 2013-05-14 DIAGNOSIS — R5381 Other malaise: Secondary | ICD-10-CM | POA: Insufficient documentation

## 2013-05-14 DIAGNOSIS — D649 Anemia, unspecified: Secondary | ICD-10-CM | POA: Diagnosis present

## 2013-05-14 DIAGNOSIS — D219 Benign neoplasm of connective and other soft tissue, unspecified: Secondary | ICD-10-CM | POA: Diagnosis present

## 2013-05-14 DIAGNOSIS — N926 Irregular menstruation, unspecified: Secondary | ICD-10-CM

## 2013-05-14 DIAGNOSIS — Z87891 Personal history of nicotine dependence: Secondary | ICD-10-CM | POA: Insufficient documentation

## 2013-05-14 DIAGNOSIS — R0789 Other chest pain: Secondary | ICD-10-CM | POA: Insufficient documentation

## 2013-05-14 DIAGNOSIS — M79609 Pain in unspecified limb: Secondary | ICD-10-CM | POA: Insufficient documentation

## 2013-05-14 DIAGNOSIS — N939 Abnormal uterine and vaginal bleeding, unspecified: Secondary | ICD-10-CM | POA: Diagnosis present

## 2013-05-14 LAB — PREPARE RBC (CROSSMATCH)

## 2013-05-14 LAB — PROTIME-INR
INR: 0.93 (ref 0.00–1.49)
Prothrombin Time: 12.3 seconds (ref 11.6–15.2)

## 2013-05-14 LAB — CBC
HCT: 26.3 % — ABNORMAL LOW (ref 36.0–46.0)
Hemoglobin: 7.7 g/dL — ABNORMAL LOW (ref 12.0–15.0)
MCH: 25 pg — ABNORMAL LOW (ref 26.0–34.0)
MCHC: 29.3 g/dL — ABNORMAL LOW (ref 30.0–36.0)
MCV: 85.4 fL (ref 78.0–100.0)
PLATELETS: 216 10*3/uL (ref 150–400)
RBC: 3.08 MIL/uL — ABNORMAL LOW (ref 3.87–5.11)
RDW: 23 % — ABNORMAL HIGH (ref 11.5–15.5)
WBC: 9.9 10*3/uL (ref 4.0–10.5)

## 2013-05-14 LAB — BASIC METABOLIC PANEL
BUN: 5 mg/dL — ABNORMAL LOW (ref 6–23)
CHLORIDE: 98 meq/L (ref 96–112)
CO2: 26 mEq/L (ref 19–32)
Calcium: 9.2 mg/dL (ref 8.4–10.5)
Creatinine, Ser: 0.66 mg/dL (ref 0.50–1.10)
GFR calc non Af Amer: >90 mL/min (ref >90)
Glucose, Bld: 238 mg/dL — ABNORMAL HIGH (ref 70–99)
Potassium: 3.6 mEq/L — ABNORMAL LOW (ref 3.7–5.3)
Sodium: 137 mEq/L (ref 137–147)

## 2013-05-14 LAB — PRO B NATRIURETIC PEPTIDE: PRO B NATRI PEPTIDE: 40.8 pg/mL (ref 0–125)

## 2013-05-14 LAB — POCT I-STAT TROPONIN I: Troponin i, poc: 0.04 ng/mL (ref 0.00–0.08)

## 2013-05-14 LAB — OCCULT BLOOD, POC DEVICE: Fecal Occult Bld: POSITIVE — AB

## 2013-05-14 MED ORDER — IBUPROFEN 600 MG PO TABS
600.0000 mg | ORAL_TABLET | Freq: Four times a day (QID) | ORAL | Status: DC | PRN
  Administered 2013-05-14 – 2013-05-16 (×6): 600 mg via ORAL
  Filled 2013-05-14 (×6): qty 1

## 2013-05-14 MED ORDER — AMLODIPINE BESYLATE 10 MG PO TABS
10.0000 mg | ORAL_TABLET | Freq: Every day | ORAL | Status: DC
  Administered 2013-05-14 – 2013-05-16 (×3): 10 mg via ORAL
  Filled 2013-05-14 (×3): qty 1

## 2013-05-14 MED ORDER — ALUM & MAG HYDROXIDE-SIMETH 200-200-20 MG/5ML PO SUSP: 30.0000 mL | Freq: Four times a day (QID) | ORAL | Status: DC | PRN

## 2013-05-14 MED ORDER — ONDANSETRON HCL 4 MG PO TABS
4.0000 mg | ORAL_TABLET | Freq: Four times a day (QID) | ORAL | Status: DC | PRN
Start: 2013-05-14 — End: 2013-05-16

## 2013-05-14 MED ORDER — ACETAMINOPHEN 650 MG RE SUPP: 650.0000 mg | Freq: Four times a day (QID) | RECTAL | Status: DC | PRN

## 2013-05-14 MED ORDER — ACETAMINOPHEN 325 MG PO TABS
650.0000 mg | ORAL_TABLET | Freq: Four times a day (QID) | ORAL | Status: DC | PRN
Start: 2013-05-14 — End: 2013-05-16
  Administered 2013-05-14 – 2013-05-15 (×3): 650 mg via ORAL
  Filled 2013-05-14 (×3): qty 2

## 2013-05-14 MED ORDER — SODIUM CHLORIDE 0.9 % IJ SOLN
3.0000 mL | Freq: Two times a day (BID) | INTRAMUSCULAR | Status: DC
  Administered 2013-05-16: 3 mL via INTRAVENOUS

## 2013-05-14 MED ORDER — SODIUM CHLORIDE 0.9 % IV SOLN
INTRAVENOUS | Status: DC
  Administered 2013-05-14 – 2013-05-16 (×3): via INTRAVENOUS

## 2013-05-14 MED ORDER — ONDANSETRON HCL 4 MG/2ML IJ SOLN
4.0000 mg | Freq: Four times a day (QID) | INTRAMUSCULAR | Status: DC | PRN
Start: 2013-05-14 — End: 2013-05-16

## 2013-05-14 MED ORDER — OXYCODONE HCL 5 MG PO TABS
5.0000 mg | ORAL_TABLET | ORAL | Status: DC | PRN
  Administered 2013-05-14 – 2013-05-15 (×2): 5 mg via ORAL
  Filled 2013-05-14 (×2): qty 1

## 2013-05-14 NOTE — ED Provider Notes (Signed)
CSN: [id number]     Arrival date & time 05/14/13  7939 History   First MD Initiated Contact with Patient 05/14/13 0920     Chief Complaint  Patient presents with  . Chest Pain  . Shortness of Breath     (Consider location/radiation/quality/duration/timing/severity/associated sxs/prior Treatment) HPI..... shortness of breath with exertion for several days. Patient reports heavy menstrual flow secondary to uterine fibroids. She has required blood transfusions in the past along with intravenous iron transfusions.  Last hemoglobin was 9. She complains of dizziness, lightheadedness, dyspnea.  Severity is moderate. Nothing makes symptoms better or worse.  Past Medical History  Diagnosis Date  . Medical history non-contributory   . Anemia   . DVT (deep venous thrombosis)   . PE (pulmonary embolism)    Past Surgical History  Procedure Laterality Date  . Polypectomy  2008    Removal of uterine polyp  . Cesarean section     Family History  Problem Relation Age of Onset  . Stroke Sister   . Hypertension Mother   . Kidney disease Mother   . Hypertension Father    History  Substance Use Topics  . Smoking status: Former Smoker -- 10 years    Quit date: 10/04/2011  . Smokeless tobacco: Never Used  . Alcohol Use: Yes     Comment: social   OB History   Grav Para Term Preterm Abortions TAB SAB Ect Mult Living   _0 0 _1 0 0 1     Review of Systems  All other systems reviewed and are negative.      Allergies  Ampicillin and Sulfa antibiotics  Home Medications   Current Outpatient Rx  Name  Route  Sig  Dispense  Refill  . acetaminophen (TYLENOL) 500 MG tablet   Oral   Take 1,000 mg by mouth every 6 (six) hours as needed for mild pain, fever or headache.          . ferrous sulfate 325 (65 FE) MG tablet   Oral   Take 325 mg by mouth 3 (three) times daily with meals.         Marland Kitchen ibuprofen (ADVIL,MOTRIN) 200 MG tablet   Oral   Take 800 mg by mouth every 6 (six)  hours as needed for fever, headache, mild pain or cramping.         . Multiple Vitamin (MULTIVITAMIN WITH MINERALS) TABS tablet   Oral   Take 1 tablet by mouth daily.          BP 143/80  Pulse 105  Resp 20  SpO2 98%  LMP 05/11/2013 Physical Exam  Nursing note and vitals reviewed. Constitutional: She is oriented to person, place, and time. She appears well-developed and well-nourished.  obese  HENT:  Head: Normocephalic and atraumatic.  Eyes: Conjunctivae and EOM are normal. Pupils are equal, round, and reactive to light.  Neck: Normal range of motion. Neck supple.  Cardiovascular: Normal rate, regular rhythm and normal heart sounds.   Pulmonary/Chest: Effort normal and breath sounds normal.  Abdominal: Soft. Bowel sounds are normal.  Genitourinary:  Rectal exam performed by RN: Heme positive. No masses  Musculoskeletal: Normal range of motion.  Neurological: She is alert and oriented to person, place, and time.  Skin: Skin is warm and dry.  Psychiatric: She has a normal mood and affect. Her behavior is normal.    ED Course  Procedures (including critical care time) Labs Review Labs Reviewed  CBC -  Abnormal; Notable for the following:    RBC 3.08 (*)    Hemoglobin 7.7 (*)    HCT 26.3 (*)    MCH 25.0 (*)    MCHC 29.3 (*)    RDW 23.0 (*)    All other components within normal limits  BASIC METABOLIC PANEL - Abnormal; Notable for the following:    Potassium 3.6 (*)    Glucose, Bld 238 (*)    BUN 5 (*)    All other components within normal limits  OCCULT BLOOD, POC DEVICE - Abnormal; Notable for the following:    Fecal Occult Bld POSITIVE (*)    All other components within normal limits  PRO B NATRIURETIC PEPTIDE  PROTIME-INR  POCT I-STAT TROPONIN I  TYPE AND SCREEN  PREPARE RBC (CROSSMATCH)   Imaging Review Dg Chest 2 View  05/14/2013   CLINICAL DATA:  Chest pain and shortness of breath.  EXAM: CHEST - 2 VIEW  COMPARISON:  CT CHEST W/O CM dated 05/11/2013; CT  ANGIO CHEST W/CM &/OR WO/CM dated 10/04/2012; DG CHEST 2 VIEW dated 10/04/2012  FINDINGS: The heart size and mediastinal contours are within normal limits. There is no evidence of pulmonary edema, consolidation, pneumothorax, nodule or pleural fluid. The visualized skeletal structures are unremarkable.  IMPRESSION: No active disease.   Electronically Signed   By: Aletta Edouard M.D.   On: 05/14/2013 10:10    EKG Interpretation   None       MDM   Final diagnoses:  Anemia    Patient is hemodynamically stable, however she has symptomatic anemia. Will order 2 units of packed red cells and admit to general medicine    Nat Christen, MD 05/14/13 1152

## 2013-05-14 NOTE — H&P (Signed)
[facility] History and Physical  [name] TEL:[id number] DOB: [birth year] DOA: 05/14/2013  Referring physician:  PCP: Nena Jordan, Mammie Lorenzo, MD   Chief Complaint: [name] menses  HPI: [name] is a 48 y.o. female with a past medical history of DVT/PE, having anticoagulation recently discontinued, history of heavy menses, uterine fibroid, status post endometrial biopsy in July 2014 which was negative for hyperplasia or malignancy with polypoid proliferative phase endometrium, who presents to the emergency department with complaints of generalized weakness. She reports having a 2 to 3-day history of worsening generalized weakness, fatigue, poor tolerance to physical exertion, having exertional shortness of breath, as well as sensation of "heart racing." She also reports having heavy menses for the past 7 days, for which she has used an entire box of pads in 3 days. She also complains of retrosternal chest pain characterized as sharp stabbing, lasting several seconds which have occurred intermittently. She denies syncope, falls, bloody stools, hematemesis, abdominal pain, melena.                                                                                                                                                                                                                                                                                    Review of Systems:  Constitutional:  No weight loss, night sweats, Fevers, chills. Positive for generalized weakness, fatigue. HEENT:  No headaches, Difficulty swallowing,Tooth/dental problems,Sore throat,  No sneezing, itching, ear ache, nasal congestion, post nasal drip,  Cardio-vascular:  No chest pain, Orthopnea, PND, swelling in lower extremities, anasarca, dizziness, palpitations  GI:  No heartburn, indigestion, abdominal pain, nausea, vomiting, diarrhea, change in bowel habits, loss of appetite  Resp:  No  shortness of breath with exertion or at rest. No excess mucus, no productive cough, No non-productive cough, No coughing up of blood.No change in color of mucus.No wheezing.No chest wall deformity  Skin:  no rash or lesions.  GU:  no dysuria, change in color of urine, no urgency or frequency. No flank pain.  Musculoskeletal:  No joint pain or swelling. No decreased range of motion. No back pain.  Psych:  No change in mood or affect. No depression or anxiety.  No memory loss.   Past Medical History  Diagnosis Date  . Medical history non-contributory   . Anemia   . DVT (deep venous thrombosis)   . PE (pulmonary embolism)    Past Surgical History  Procedure Laterality Date  . Polypectomy  2008    Removal of uterine polyp  . Cesarean section     Social History:  reports that she quit smoking about 19 months ago. She has never used smokeless tobacco. She reports that she drinks alcohol. She reports that she does not use illicit drugs.  Allergies  Allergen Reactions  . Ampicillin Other (See Comments)    Severe abdominal pain, dizziness (penicillin is okay)  . Sulfa Antibiotics Other (See Comments)    Very bad yeast infection    Family History  Problem Relation Age of Onset  . Stroke Sister   . Hypertension Mother   . Kidney disease Mother   . Hypertension Father      Prior to Admission medications   Medication Sig Start Date End Date Taking? Authorizing Provider  acetaminophen (TYLENOL) 500 MG tablet Take 1,000 mg by mouth every 6 (six) hours as needed for mild pain, fever or headache.    Yes Historical Provider, MD  ferrous sulfate 325 (65 FE) MG tablet Take 325 mg by mouth 3 (three) times daily with meals.   Yes Historical Provider, MD  ibuprofen (ADVIL,MOTRIN) 200 MG tablet Take 800 mg by mouth every 6 (six) hours as needed for fever, headache, mild pain or cramping.   Yes Historical Provider, MD  Multiple Vitamin (MULTIVITAMIN WITH MINERALS) TABS tablet Take 1 tablet by  mouth daily.   Yes Historical Provider, MD   Physical Exam: Filed Vitals:   05/14/13 1345  BP: 142/84  Pulse: 91  Temp: 98.3 F (36.8 C)  Resp: 18    BP 142/84  Pulse 91  Temp(Src) 98.3 F (36.8 C) (Oral)  Resp 18  SpO2 97%  LMP 05/11/2013  General:  Appears calm and comfortable Eyes: PERRL, normal lids, irises & conjunctiva ENT: grossly normal hearing, lips & tongue Neck: no LAD, masses or thyromegaly Cardiovascular: RRR, no m/r/g. No LE edema. Telemetry: SR, no arrhythmias  Respiratory: CTA bilaterally, no w/r/r. Normal respiratory effort. Abdomen: soft, ntnd Skin: no rash or induration seen on limited exam Musculoskeletal: grossly normal tone BUE/BLE Psychiatric: grossly normal mood and affect, speech fluent and appropriate Neurologic: grossly non-focal.          Labs on Admission:  Basic Metabolic Panel:  Recent Labs Lab 05/14/13 0930  NA 137  K 3.6*  CL 98  CO2 26  GLUCOSE 238*  BUN 5*  CREATININE 0.66  CALCIUM 9.2   Liver Function Tests: No results found for this basename: AST, ALT, ALKPHOS, BILITOT, PROT, ALBUMIN,  in the last 168 hours No results found for this basename: LIPASE, AMYLASE,  in the last 168 hours No results found for this basename: AMMONIA,  in the last 168 hours CBC:  Recent Labs Lab 05/14/13 0930  WBC 9.9  HGB 7.7*  HCT 26.3*  MCV 85.4  PLT 216   Cardiac Enzymes: No results found for this basename: CKTOTAL, CKMB, CKMBINDEX, TROPONINI,  in the last 168 hours  BNP (last 3 results)  Recent Labs  05/14/13 0930  PROBNP 40.8   CBG: No results found for this basename: GLUCAP,  in the last 168 hours  Radiological Exams on Admission: Dg Chest 2 View  05/14/2013   CLINICAL DATA:  Chest pain  and shortness of breath.  EXAM: CHEST - 2 VIEW  COMPARISON:  CT CHEST W/O CM dated 05/11/2013; CT ANGIO CHEST W/CM &/OR WO/CM dated 10/04/2012; DG CHEST 2 VIEW dated 10/04/2012  FINDINGS: The heart size and mediastinal contours are within  normal limits. There is no evidence of pulmonary edema, consolidation, pneumothorax, nodule or pleural fluid. The visualized skeletal structures are unremarkable.  IMPRESSION: No active disease.   Electronically Signed   By: Aletta Edouard M.D.   On: 05/14/2013 10:10    EKG: Independently reviewed.   Assessment/Plan Active Problems:   Acute blood loss anemia   Abnormal uterine bleeding   Fibroids   Anemia   1. Acute blood loss anemia in setting of heavy menses. Patient presenting with symptomatic anemia. Initial lab work performed in the emergency department revealed a hemoglobin of 7.7 with hematocrit of 26.3. She reports having heavy menses for one week now and feels this to be the cause of her anemia. She denies hematemesis, bright red blood per rectum, melena. Given the presence of symptomatic anemia, will type and cross and transfuse 2 units of packed red blood cells. Provide supportive care, overnight observation. 2. Heavy menses. Pelvic ultrasound performed in July of 2014 showing large uterine size with focal fibroid which could be consistent with underlying adenomyosis. He underwent endometrial biopsy in July 2014 which was negative for hyperplasia or malignancy.  3. History of DVT/PE. Patient denied any family history of venous thromboembolism. She currently sees Dr.Chism of hematology. Patient having a hypercoagulable workup done in the past which was negative. Patient completed 6 months of anticoagulation in 04/06/2013. 4. DVT prophylaxis. SCDs     Code Status: Full Code Family Communication: I spoke with her sister present at bedise Disposition Plan: Place patient in overnight obs, do not anticipate her requiring greater than 2 nights hospitalization  Time spent: 55 min  Kelvin Cellar [facility] Pager [phone]

## 2013-05-14 NOTE — ED Notes (Signed)
Pt from home c/o of chest pain since yesterday and shortness of breath x 4 days. HX of anemia. She also reports having heavy vaginal bleeding with clots. Last hemoglobin check it was 9.

## 2013-05-15 ENCOUNTER — Observation Stay (HOSPITAL_COMMUNITY): Payer: 59

## 2013-05-15 DIAGNOSIS — M79609 Pain in unspecified limb: Secondary | ICD-10-CM

## 2013-05-15 LAB — TYPE AND SCREEN
ABO/RH(D): O POS
Antibody Screen: NEGATIVE
UNIT DIVISION: 0
Unit division: 0

## 2013-05-15 LAB — BASIC METABOLIC PANEL
BUN: 7 mg/dL (ref 6–23)
CHLORIDE: 102 meq/L (ref 96–112)
CO2: 26 mEq/L (ref 19–32)
Calcium: 8.4 mg/dL (ref 8.4–10.5)
Creatinine, Ser: 0.7 mg/dL (ref 0.50–1.10)
GFR calc Af Amer: >90 mL/min (ref >90)
GFR calc non Af Amer: >90 mL/min (ref >90)
GLUCOSE: 218 mg/dL — AB (ref 70–99)
Potassium: 3.8 mEq/L (ref 3.7–5.3)
Sodium: 139 mEq/L (ref 137–147)

## 2013-05-15 LAB — CBC
HEMATOCRIT: 28 % — AB (ref 36.0–46.0)
Hemoglobin: 8.5 g/dL — ABNORMAL LOW (ref 12.0–15.0)
MCH: 26 pg (ref 26.0–34.0)
MCHC: 30.4 g/dL (ref 30.0–36.0)
MCV: 85.6 fL (ref 78.0–100.0)
PLATELETS: 156 10*3/uL (ref 150–400)
RBC: 3.27 MIL/uL — ABNORMAL LOW (ref 3.87–5.11)
RDW: 21.4 % — AB (ref 11.5–15.5)
WBC: 8.8 10*3/uL (ref 4.0–10.5)

## 2013-05-15 LAB — TSH: TSH: 0.618 u[IU]/mL (ref 0.350–4.500)

## 2013-05-15 MED ORDER — IOHEXOL 350 MG/ML SOLN
100.0000 mL | Freq: Once | INTRAVENOUS | Status: AC | PRN
  Administered 2013-05-15: 100 mL via INTRAVENOUS

## 2013-05-15 MED ORDER — FERROUS SULFATE 325 (65 FE) MG PO TABS
325.0000 mg | ORAL_TABLET | Freq: Three times a day (TID) | ORAL | Status: DC
  Administered 2013-05-15 – 2013-05-16 (×2): 325 mg via ORAL
  Filled 2013-05-15 (×5): qty 1

## 2013-05-15 MED ORDER — NORETHINDRONE ACETATE 5 MG PO TABS
10.0000 mg | ORAL_TABLET | Freq: Two times a day (BID) | ORAL | Status: DC
  Administered 2013-05-15 – 2013-05-16 (×2): 10 mg via ORAL
  Filled 2013-05-15 (×3): qty 2

## 2013-05-15 NOTE — Progress Notes (Addendum)
PROGRESS NOTE  [name] VHQ:[id number] DOB: 1965/11/25 DOA: 05/14/2013 PCP: Cloyd Stagers, MD  Assessment/Plan: ABLA in the setting of heavy menses - Hb stable this morning, bleeding decreasing. She is regularly seen by Dr. Ferol Luz and there have been discussions about hysterectomy but since she was on Coumadin that was postponed.  - monitor HH in am - discussed case with Dr. Ferol Luz and will start Aygestin to control her bleeding and she is going to plan hysterectomy.  Heavy menses - will be followed up as an outpatient. Bleeding decreasing now.  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 Repeat CTPA and DVT US  Diet: regular Fluids: none DVT Prophylaxis: SCDs  Code Status: Full Family Communication: none  Disposition Plan: home when ready, 1 day  Consultants:  none  Procedures:  none   Antibiotics  Anti-infectives   None     Antibiotics Given (last 72 hours)   None      HPI/Subjective: - no complaints this morning, feeling better.   Objective: Filed Vitals:   05/14/13 1940 05/14/13 2114 05/15/13 0552 05/15/13 1003  BP: 161/84 138/76 146/98 160/96  Pulse: 91 90 90   Temp: 98.3 F (36.8 C) 97.4 F (36.3 C) 98.4 F (36.9 C)   TempSrc: Oral Oral Oral   Resp: _0 Height:      Weight:      SpO2: 98% 97% 100%     Intake/Output Summary (Last 24 hours) at 05/15/13 1435 Last data filed at 05/15/13 0700  Gross per 24 hour  Intake 1402.08 ml  Output      0 ml  Net 1402.08 ml   Filed Weights   05/14/13 1328  Weight: 117.164 kg (258 lb 4.8 oz)    Exam:   General:  NAD  Cardiovascular: regular rate and rhythm, without MRG  Respiratory: good air movement, clear to auscultation throughout, no wheezing, ronchi or rales  Abdomen: soft, not tender to palpation, positive bowel sounds  MSK: no peripheral edema  Neuro: CN 2-12 grossly intact, MS 5/5 in all 4  Data Reviewed: Basic Metabolic Panel:  Recent  Labs Lab 05/14/13 0930 05/15/13 0420  NA 137 139  K 3.6* 3.8  CL 98 102  CO2 26 26  GLUCOSE 238* 218*  BUN 5* 7  CREATININE 0.66 0.70  CALCIUM 9.2 8.4   Liver Function Tests: No results found for this basename: AST, ALT, ALKPHOS, BILITOT, PROT, ALBUMIN,  in the last 168 hours No results found for this basename: LIPASE, AMYLASE,  in the last 168 hours No results found for this basename: AMMONIA,  in the last 168 hours CBC:  Recent Labs Lab 05/14/13 0930 05/15/13 0420  WBC 9.9 8.8  HGB 7.7* 8.5*  HCT 26.3* 28.0*  MCV 85.4 85.6  PLT 216 156   Cardiac Enzymes: No results found for this basename: CKTOTAL, CKMB, CKMBINDEX, TROPONINI,  in the last 168 hours BNP (last 3 results)  Recent Labs  05/14/13 0930  PROBNP 40.8   CBG: No results found for this basename: GLUCAP,  in the last 168 hours  No results found for this or any previous visit (from the past 240 hour(s)).   Studies: Dg Chest 2 View  05/14/2013   CLINICAL DATA:  Chest pain and shortness of breath.  EXAM: CHEST - 2 VIEW  COMPARISON:  CT CHEST W/O CM dated 05/11/2013; CT ANGIO CHEST W/CM &/OR WO/CM dated 10/04/2012; DG CHEST 2 VIEW dated 10/04/2012  FINDINGS: The heart size and mediastinal contours are within normal limits. There is no evidence of pulmonary edema, consolidation, pneumothorax, nodule or pleural fluid. The visualized skeletal structures are unremarkable.  IMPRESSION: No active disease.   Electronically Signed   By: Aletta Edouard M.D.   On: 05/14/2013 10:10   Ct Angio Chest Pe W/cm &/or Wo Cm  05/15/2013   CLINICAL DATA:  Chest pain and shortness of breath. Evaluate for pulmonary embolus  EXAM: CT ANGIOGRAPHY CHEST WITH CONTRAST  TECHNIQUE: Multidetector CT imaging of the chest was performed using the standard protocol during bolus administration of intravenous contrast. Multiplanar CT image reconstructions and MIPs were obtained to evaluate the vascular anatomy.  CONTRAST:  114m OMNIPAQUE IOHEXOL 350  MG/ML SOLN  COMPARISON:  Chest CT 05/11/2013  FINDINGS: THORACIC INLET/BODY WALL:  No acute abnormality.  MEDIASTINUM:  No cardiomegaly or pericardial effusion. Prominent main pulmonary artery, size accentuated by motion artifact, 3.7 cm in diameter. This finding suggests pulmonary hypertension.  There is no pulmonary arterial filling defect identified. Sensitivity is moderately degraded by respiratory motion, bolus dispersion, and patient size. In general, the exam is diagnostic to the level of the proximal segmental pulmonary arteries.  LUNG WINDOWS:  No consolidation, edema, effusion, or pneumothorax. There is patchy ground-glass density which could reflect atelectasis. A left lower lobe pulmonary nodule described on previous CT is stable from prior, as expected.  UPPER ABDOMEN:  No acute findings.  OSSEOUS:  No acute fracture.  No suspicious lytic or blastic lesions.  Review of the MIP images confirms the above findings.  IMPRESSION: No evidence of pulmonary embolism. Technical factors decrease sensitivity beyond the proximal segmental pulmonary arteries.   Electronically Signed   By: JJorje GuildM.D.   On: 05/15/2013 12:05    Scheduled Meds: . amLODipine  10 mg Oral Daily  . sodium chloride  3 mL Intravenous Q12H   Continuous Infusions: . sodium chloride 75 mL/hr at 05/14/13 2056    Active Problems:   Abnormal uterine bleeding   Fibroids   Anemia   Acute blood loss anemia   Time spent: 35  This note has been created with DSurveyor, quantity Any transcriptional errors are unintentional.   CMarzetta Board MD [facility] Pager [phone] If 7 PM - 7 AM, please contact night-coverage at [email or web address], password [facility]2/16/2015, 2:35 PM  LOS: 1 day

## 2013-05-15 NOTE — Progress Notes (Signed)
VASCULAR LAB PRELIMINARY  PRELIMINARY  PRELIMINARY  PRELIMINARY  Bilateral lower extremity venous duplex completed.    Preliminary report:  Bilateral:  No evidence of DVT, superficial thrombosis, or Baker's Cyst.   Britton Bera, RVS 05/15/2013, 12:08 PM

## 2013-05-16 LAB — CBC
HCT: 27.5 % — ABNORMAL LOW (ref 36.0–46.0)
Hemoglobin: 8.6 g/dL — ABNORMAL LOW (ref 12.0–15.0)
MCH: 27.2 pg (ref 26.0–34.0)
MCHC: 31.3 g/dL (ref 30.0–36.0)
MCV: 87 fL (ref 78.0–100.0)
PLATELETS: 142 10*3/uL — AB (ref 150–400)
RBC: 3.16 MIL/uL — ABNORMAL LOW (ref 3.87–5.11)
RDW: 22.7 % — AB (ref 11.5–15.5)
WBC: 9.8 10*3/uL (ref 4.0–10.5)

## 2013-05-16 MED ORDER — NORETHINDRONE ACETATE 5 MG PO TABS: 10.0000 mg | ORAL_TABLET | Freq: Every day | ORAL | Status: DC

## 2013-05-16 NOTE — Discharge Summary (Signed)
Physician Discharge Summary  Tiffany Tweten LPN:[id number] DOB: 1966/01/02 DOA: 05/14/2013  PCP: Cloyd Stagers, MD  Admit date: 05/14/2013 Discharge date: 05/16/2013  Time spent: 35 minutes  Recommendations for Outpatient Follow-up:  1. Follow up with OBGYN as an outpatient in 1 week for planned hysterectomy 2. Follow up with PCP in 3-5 days   Recommendations for primary care physician for things to follow:  Repeat CBC  Discharge Diagnoses:  Active Problems:   Abnormal uterine bleeding   Fibroids   Anemia   Acute blood loss anemia  Discharge Condition: stable  Diet recommendation: regular  Filed Weights   05/14/13 1328  Weight: 117.164 kg (258 lb 4.8 oz)   History of present illness:  [name] is a 48 y.o. female with a past medical history of DVT/PE, having anticoagulation recently discontinued, history of heavy menses, uterine fibroid, status post endometrial biopsy in July 2014 which was negative for hyperplasia or malignancy with polypoid proliferative phase endometrium, who presents to the emergency department with complaints of generalized weakness. She reports having a 2 to 3-day history of worsening generalized weakness, fatigue, poor tolerance to physical exertion, having exertional shortness of breath, as well as sensation of "heart racing." She also reports having heavy menses for the past 7 days, for which she has used an entire box of pads in 3 days. She also complains of retrosternal chest pain characterized as sharp stabbing, lasting several seconds which have occurred intermittently. She denies syncope, falls, bloody stools, hematemesis, abdominal pain, melena.  Hospital Course:  ABLA in the setting of heavy menses - Hb stable this morning, bleeding decreasing and patient feeling significantly improved. She had an FOBT checked, but not clean catch and I suspect related to her heavy menses. Denies current or prior GI bleed, no GERD/dyspepsia, no  dark stools. For her GYN, she is regularly seen by Dr. Ferol Luz and there have been discussions about hysterectomy but since she was on Coumadin that was postponed. Discussed case with Dr. Ferol Luz and will start Aygestin to control her bleeding and she is going to plan hysterectomy. Follow up in clinic next week.  Heavy menses - will be followed up as an outpatient. Bleeding decreasing now.  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 Patient with chest pain and SOB on presentation. Repeat CTPA without evidence of PE. DVT US without evidence of DVT.   Procedures:  LE venous doppler  Summary: - No evidence of deep vein or superficial thrombosis involving the right lower extremity and left common femoral vein.  Consultations:  none  Discharge Exam: Filed Vitals:   05/15/13 1003 05/15/13 1436 05/15/13 2258 05/16/13 0703  BP: 160/96 164/88 148/91 150/84  Pulse:  92 92 91  Temp:  98.1 F (36.7 C) 98.7 F (37.1 C) 98.7 F (37.1 C)  TempSrc:  Oral Oral Oral  Resp:  _0 Height:      Weight:      SpO2:  96% 96% 96%   General: NAD Cardiovascular: RRR Respiratory: CTA biL  Discharge Instructions   Future Appointments Provider Department Dept Phone   06/07/2013 8:30 AM [facility] Only [facility] [phone]   07/06/2013 8:30 AM [facility] 2 [facility] [phone]   07/06/2013 9:00 AM [facility] Covering Provider [facility] [phone]   08/15/2013 11:30 AM [facility] [place] [facility] [place] [phone]  Medication List    STOP taking these medications       ibuprofen 200 MG tablet  Commonly known as:  ADVIL,MOTRIN      TAKE these medications       acetaminophen 500 MG tablet  Commonly known as:  TYLENOL  Take 1,000 mg by mouth every 6 (six) hours as needed for mild pain, fever or headache.     ferrous sulfate 325 (65  FE) MG tablet  Take 325 mg by mouth 3 (three) times daily with meals.     multivitamin with minerals Tabs tablet  Take 1 tablet by mouth daily.     norethindrone 5 MG tablet  Commonly known as:  AYGESTIN  Take 2 tablets (10 mg total) by mouth daily. Take twice a day until 2/18 and one tablet daily starting 2/19.           Follow-up Information   Follow [facility] with [facility], IBTEHAL, MD. Schedule an appointment as soon as possible for a visit in 4 days. (repeat CBC)    Specialty:  Internal Medicine   Contact information:   [place]  [address] [place] [place] [address] [phone]       Follow up with OBGYN In 1 week.   Contact information:   [phone]      The results of significant diagnostics from this hospitalization (including imaging, microbiology, ancillary and laboratory) are listed below for reference.    Significant Diagnostic Studies: Dg Chest 2 View  05/14/2013   CLINICAL DATA:  Chest pain and shortness of breath.  EXAM: CHEST - 2 VIEW  COMPARISON:  CT CHEST W/O CM dated 05/11/2013; CT ANGIO CHEST W/CM &/OR WO/CM dated 10/04/2012; DG CHEST 2 VIEW dated 10/04/2012  FINDINGS: The heart size and mediastinal contours are within normal limits. There is no evidence of pulmonary edema, consolidation, pneumothorax, nodule or pleural fluid. The visualized skeletal structures are unremarkable.  IMPRESSION: No active disease.   Electronically Signed   By: Aletta Edouard M.D.   On: 05/14/2013 10:10   Ct Chest Wo Contrast  05/11/2013   CLINICAL DATA:  Left lower lobe nodule, smoker  EXAM: CT CHEST WITHOUT CONTRAST  TECHNIQUE: Multidetector CT imaging of the chest was performed following the standard protocol without IV contrast.  COMPARISON:  CT ANGIO CHEST W/CM &/OR WO/CM dated 10/04/2012  FINDINGS: No axillary or supraclavicular lymphadenopathy. No mediastinal hilar lymphadenopathy. No pericardial fluid. Esophagus is normal.  Review of the lung parenchyma demonstrates no  suspicious pulmonary nodules. Tiny subpleural nodule measuring 3 mm corresponds to the 4 to 5 mm nodule on comparison exam. No interval change. Limited view of the upper abdomen is unremarkable. Limited view of the skeleton is unremarkable.  IMPRESSION: Left lower lobe nodule measures less than 4 mm. If the patient is at high risk for bronchogenic carcinoma, follow-up chest CT at 1 year is recommended. If the patient is at low risk, no follow-up is needed. This recommendation follows the consensus statement: Guidelines for Management of Small Pulmonary Nodules Detected on CT Scans: A Statement from the [place] as published in Radiology 2005; 237:395-400.   Electronically Signed   By: Suzy Bouchard M.D.   On: 05/11/2013 15:36   Ct Angio Chest Pe W/cm &/or Wo Cm  05/15/2013   CLINICAL DATA:  Chest pain and shortness of breath. Evaluate for pulmonary embolus  EXAM: CT ANGIOGRAPHY CHEST WITH CONTRAST  TECHNIQUE: Multidetector CT imaging of the chest was performed using the standard protocol  during bolus administration of intravenous contrast. Multiplanar CT image reconstructions and MIPs were obtained to evaluate the vascular anatomy.  CONTRAST:  153m OMNIPAQUE IOHEXOL 350 MG/ML SOLN  COMPARISON:  Chest CT 05/11/2013  FINDINGS: THORACIC INLET/BODY WALL:  No acute abnormality.  MEDIASTINUM:  No cardiomegaly or pericardial effusion. Prominent main pulmonary artery, size accentuated by motion artifact, 3.7 cm in diameter. This finding suggests pulmonary hypertension.  There is no pulmonary arterial filling defect identified. Sensitivity is moderately degraded by respiratory motion, bolus dispersion, and patient size. In general, the exam is diagnostic to the level of the proximal segmental pulmonary arteries.  LUNG WINDOWS:  No consolidation, edema, effusion, or pneumothorax. There is patchy ground-glass density which could reflect atelectasis. A left lower lobe pulmonary nodule described on previous CT  is stable from prior, as expected.  UPPER ABDOMEN:  No acute findings.  OSSEOUS:  No acute fracture.  No suspicious lytic or blastic lesions.  Review of the MIP images confirms the above findings.  IMPRESSION: No evidence of pulmonary embolism. Technical factors decrease sensitivity beyond the proximal segmental pulmonary arteries.   Electronically Signed   By: JJorje GuildM.D.   On: 05/15/2013 12:05   Labs: Basic Metabolic Panel:  Recent Labs Lab 05/14/13 0930 05/15/13 0420  NA 137 139  K 3.6* 3.8  CL 98 102  CO2 26 26  GLUCOSE 238* 218*  BUN 5* 7  CREATININE 0.66 0.70  CALCIUM 9.2 8.4   CBC:  Recent Labs Lab 05/14/13 0930 05/15/13 0420 05/16/13 0357  WBC 9.9 8.8 9.8  HGB 7.7* 8.5* 8.6*  HCT 26.3* 28.0* 27.5*  MCV 85.4 85.6 87.0  PLT 216 156 142*   BNP: BNP (last 3 results)  Recent Labs  05/14/13 0930  PROBNP 40.8    Signed:  Miyah Hampshire  [facility] 05/16/2013, 11:59 AM

## 2013-05-16 NOTE — Discharge Instructions (Signed)

## 2013-05-31 ENCOUNTER — Other Ambulatory Visit: Payer: Self-pay | Admitting: Obstetrics & Gynecology

## 2013-05-31 DIAGNOSIS — N852 Hypertrophy of uterus: Secondary | ICD-10-CM

## 2013-06-01 ENCOUNTER — Other Ambulatory Visit: Payer: 59

## 2013-06-01 ENCOUNTER — Other Ambulatory Visit: Payer: Self-pay | Admitting: Obstetrics & Gynecology

## 2013-06-04 ENCOUNTER — Other Ambulatory Visit: Payer: 59

## 2013-06-05 ENCOUNTER — Inpatient Hospital Stay (HOSPITAL_COMMUNITY): Admission: RE | Admit: 2013-06-05 | Payer: 59 | Source: Ambulatory Visit

## 2013-06-06 ENCOUNTER — Encounter (HOSPITAL_COMMUNITY)
Admission: RE | Admit: 2013-06-06 | Discharge: 2013-06-06 | Disposition: A | Discharge status: Home / Self Care | Payer: 59 | Source: Ambulatory Visit | Attending: Obstetrics & Gynecology | Admitting: Obstetrics & Gynecology

## 2013-06-06 ENCOUNTER — Encounter (HOSPITAL_COMMUNITY): Payer: Self-pay | Admitting: *Deleted

## 2013-06-06 ENCOUNTER — Encounter (HOSPITAL_COMMUNITY): Payer: Self-pay

## 2013-06-06 ENCOUNTER — Inpatient Hospital Stay (HOSPITAL_COMMUNITY)
Admission: RE | Admit: 2013-06-06 | Discharge: 2013-06-10 | DRG: 743 | Disposition: A | Discharge status: Home / Self Care | Payer: 59 | Source: Ambulatory Visit | Attending: Obstetrics & Gynecology | Admitting: Obstetrics & Gynecology

## 2013-06-06 DIAGNOSIS — D62 Acute posthemorrhagic anemia: Secondary | ICD-10-CM

## 2013-06-06 DIAGNOSIS — D500 Iron deficiency anemia secondary to blood loss (chronic): Secondary | ICD-10-CM | POA: Diagnosis present

## 2013-06-06 DIAGNOSIS — G4730 Sleep apnea, unspecified: Secondary | ICD-10-CM | POA: Diagnosis present

## 2013-06-06 DIAGNOSIS — I8290 Acute embolism and thrombosis of unspecified vein: Secondary | ICD-10-CM

## 2013-06-06 DIAGNOSIS — N939 Abnormal uterine and vaginal bleeding, unspecified: Secondary | ICD-10-CM

## 2013-06-06 DIAGNOSIS — Z87891 Personal history of nicotine dependence: Secondary | ICD-10-CM

## 2013-06-06 DIAGNOSIS — R911 Solitary pulmonary nodule: Secondary | ICD-10-CM

## 2013-06-06 DIAGNOSIS — Z86718 Personal history of other venous thrombosis and embolism: Secondary | ICD-10-CM

## 2013-06-06 DIAGNOSIS — Z90710 Acquired absence of both cervix and uterus: Secondary | ICD-10-CM | POA: Diagnosis not present

## 2013-06-06 DIAGNOSIS — Z86711 Personal history of pulmonary embolism: Secondary | ICD-10-CM

## 2013-06-06 DIAGNOSIS — E876 Hypokalemia: Secondary | ICD-10-CM

## 2013-06-06 DIAGNOSIS — D219 Benign neoplasm of connective and other soft tissue, unspecified: Secondary | ICD-10-CM

## 2013-06-06 DIAGNOSIS — N920 Excessive and frequent menstruation with regular cycle: Secondary | ICD-10-CM | POA: Diagnosis present

## 2013-06-06 DIAGNOSIS — D251 Intramural leiomyoma of uterus: Secondary | ICD-10-CM | POA: Diagnosis present

## 2013-06-06 DIAGNOSIS — D649 Anemia, unspecified: Secondary | ICD-10-CM | POA: Diagnosis present

## 2013-06-06 DIAGNOSIS — D252 Subserosal leiomyoma of uterus: Principal | ICD-10-CM | POA: Diagnosis present

## 2013-06-06 DIAGNOSIS — N8000 Endometriosis of the uterus, unspecified: Secondary | ICD-10-CM | POA: Diagnosis present

## 2013-06-06 HISTORY — DX: Acquired absence of both cervix and uterus: Z90.710

## 2013-06-06 LAB — ABO/RH: ABO/RH(D): O POS

## 2013-06-06 LAB — PREPARE RBC (CROSSMATCH)

## 2013-06-06 LAB — CBC WITH DIFFERENTIAL/PLATELET
Basophils Absolute: 0 10*3/uL (ref 0.0–0.1)
Basophils Relative: 0 % (ref 0–1)
Eosinophils Absolute: 0.2 10*3/uL (ref 0.0–0.7)
Eosinophils Relative: 2 % (ref 0–5)
HCT: 24.4 % — ABNORMAL LOW (ref 36.0–46.0)
Hemoglobin: 7 g/dL — ABNORMAL LOW (ref 12.0–15.0)
LYMPHS ABS: 1.7 10*3/uL (ref 0.7–4.0)
LYMPHS PCT: 20 % (ref 12–46)
MCH: 24.9 pg — ABNORMAL LOW (ref 26.0–34.0)
MCHC: 28.7 g/dL — ABNORMAL LOW (ref 30.0–36.0)
MCV: 86.8 fL (ref 78.0–100.0)
Monocytes Absolute: 0.6 10*3/uL (ref 0.1–1.0)
Monocytes Relative: 8 % (ref 3–12)
NEUTROS ABS: 6 10*3/uL (ref 1.7–7.7)
NEUTROS PCT: 70 % (ref 43–77)
Platelets: 170 10*3/uL (ref 150–400)
RBC: 2.81 MIL/uL — ABNORMAL LOW (ref 3.87–5.11)
RDW: 19.4 % — AB (ref 11.5–15.5)
WBC: 8.6 10*3/uL (ref 4.0–10.5)

## 2013-06-06 LAB — COMPREHENSIVE METABOLIC PANEL
ALK PHOS: 50 U/L (ref 39–117)
ALT: 10 U/L (ref 0–35)
AST: 14 U/L (ref 0–37)
Albumin: 3.3 g/dL — ABNORMAL LOW (ref 3.5–5.2)
BUN: 5 mg/dL — ABNORMAL LOW (ref 6–23)
CO2: 26 meq/L (ref 19–32)
Calcium: 9.2 mg/dL (ref 8.4–10.5)
Chloride: 103 mEq/L (ref 96–112)
Creatinine, Ser: 0.66 mg/dL (ref 0.50–1.10)
GFR calc Af Amer: >90 mL/min (ref >90)
Glucose, Bld: 161 mg/dL — ABNORMAL HIGH (ref 70–99)
POTASSIUM: 4.3 meq/L (ref 3.7–5.3)
SODIUM: 140 meq/L (ref 137–147)
Total Bilirubin: 0.4 mg/dL (ref 0.3–1.2)
Total Protein: 8.2 g/dL (ref 6.0–8.3)

## 2013-06-06 LAB — PREGNANCY, URINE: Preg Test, Ur: NEGATIVE

## 2013-06-06 MED ORDER — OXYCODONE-ACETAMINOPHEN 5-325 MG PO TABS
1.0000 | ORAL_TABLET | Freq: Every evening | ORAL | Status: DC | PRN
  Administered 2013-06-07: 1 via ORAL
  Filled 2013-06-06: qty 1

## 2013-06-06 MED ORDER — GENTAMICIN SULFATE 40 MG/ML IJ SOLN
INTRAVENOUS | Status: AC
  Administered 2013-06-07: 116 mL via INTRAVENOUS
  Filled 2013-06-06: qty 10

## 2013-06-06 MED ORDER — GENTAMICIN SULFATE 40 MG/ML IJ SOLN: INTRAVENOUS | Status: DC

## 2013-06-06 MED ORDER — FUROSEMIDE 10 MG/ML IJ SOLN
20.0000 mg | Freq: Once | INTRAMUSCULAR | Status: AC
  Administered 2013-06-07: 20 mg via INTRAVENOUS
  Filled 2013-06-06: qty 2

## 2013-06-06 MED ORDER — DIPHENHYDRAMINE HCL 25 MG PO CAPS
25.0000 mg | ORAL_CAPSULE | Freq: Once | ORAL | Status: AC
  Administered 2013-06-06: 25 mg via ORAL
  Filled 2013-06-06: qty 1

## 2013-06-06 MED ORDER — GUAIFENESIN 100 MG/5ML PO SOLN
200.0000 mg | Freq: Four times a day (QID) | ORAL | Status: DC | PRN
  Administered 2013-06-06 – 2013-06-07 (×2): 200 mg via ORAL
  Filled 2013-06-06 (×2): qty 15

## 2013-06-06 NOTE — H&P (Addendum)
[name] is an 48 y.o. female [phone] with symptomatic anemia from large fibroid uterus with Hgb at 7 today in pre-op, who is admitted for blood transfusion and urgent hysterectomy tomorrow (moved up from 3/19 due to bleeding). Office sonogram noted uterus volume at about 1000 cc.  Known fibroids for several yrs, bleeding heavily since last year, has seen several doctors to get hysterectomy in past. Hx was complicated by PE and DVT and was on Coumadin for 6 months, saw Heme and stopped it in Jan'15  Normal Paps, no breast complaints.  Blood transfusions: yes, several   Patient's last menstrual period was 05/11/2013.   Medical history: Pulmonary embolism, DVT s/p Coumadin x 6 mos and stopped in Jan'15 Anemia Fibroids Obesity  Past Surgical History  Procedure Laterality Date  . Polypectomy  2008    Removal of uterine polyp  . Cesarean section      Family History  Problem Relation Age of Onset  . Stroke Sister   . Hypertension Mother   . Kidney disease Mother   . Hypertension Father     Social History:  reports that she quit smoking about 20 months ago. She has never used smokeless tobacco. She reports that she drinks alcohol. She reports that she does not use illicit drugs.  Allergies:  Allergies  Allergen Reactions  . Ampicillin Other (See Comments)    Severe abdominal pain, dizziness (penicillin is okay)  . Sulfa Antibiotics Other (See Comments)    Very bad yeast infection    Prescriptions prior to admission  Medication Sig Dispense Refill  . acetaminophen (TYLENOL) 500 MG tablet Take 1,000 mg by mouth every 6 (six) hours as needed for mild pain, fever or headache.       . ferrous sulfate 325 (65 FE) MG tablet Take 325 mg by mouth 3 (three) times daily with meals.      Marland Kitchen guaiFENesin (ROBITUSSIN) 100 MG/5ML SOLN Take 10 mLs by mouth every 4 (four) hours as needed for cough.      Marland Kitchen ibuprofen (ADVIL,MOTRIN) 200 MG tablet Take 800 mg by mouth 2 (two) times daily as  needed for moderate pain.      . Multiple Vitamin (MULTIVITAMIN WITH MINERALS) TABS tablet Take 1 tablet by mouth daily.      . norethindrone (AYGESTIN) 5 MG tablet Take 2 tablets (10 mg total) by mouth daily. Take twice a day until 2/18 and one tablet daily starting 2/19.  30 tablet  1    ROS  Fatigue, SOB  Blood pressure 164/75, pulse 109, temperature 98.6 F (37 C), temperature source Oral, resp. rate 20, height _0  (1.651 m), weight 254 lb 12.8 oz (115.577 kg), last menstrual period 05/11/2013, SpO2 97.00%. Physical Exam Physical exam:  A&O x 3, no acute distress. Pleasant HEENT neg, no thyromegaly Lungs CTA bilat CV RRR, A1S2 normal Abdo soft, non tender, non acute. Uteus 20 wks size fibroid , mobile Extr no edema/ tenderness  Results for orders placed during the hospital encounter of 06/06/13 (from the past 24 hour(s))  TYPE AND SCREEN     Status: None   Collection Time    06/06/13  8:45 PM      Result Value Ref Range   ABO/RH(D) O POS     Antibody Screen NEG     Sample Expiration 06/09/2013     Unit Number [id number]     Blood Component Type RED CELLS,LR     Unit division 00     Status  of Unit ISSUED     Transfusion Status OK TO TRANSFUSE     Crossmatch Result Compatible     Unit Number [id number]     Blood Component Type RED CELLS,LR     Unit division 00     Status of Unit ALLOCATED     Transfusion Status OK TO TRANSFUSE     Crossmatch Result Compatible    ABO/RH     Status: None   Collection Time    06/06/13  8:45 PM      Result Value Ref Range   ABO/RH(D) O POS    PREPARE RBC (CROSSMATCH)     Status: None   Collection Time    06/06/13  9:00 PM      Result Value Ref Range   Order Confirmation ORDER PROCESSED BY BLOOD BANK    PREGNANCY, URINE     Status: None   Collection Time    06/06/13  9:30 PM      Result Value Ref Range   Preg Test, Ur NEGATIVE  NEGATIVE   Assessment/Plan: Fibroids- TAH/ bilateral salpingectomy tomorrow. Lasix x once 20 mg  after 1st unit.  Anemia - 3 units packed cells now, 4th on call to OR \ Risks/complications of surgery reviewed incl infection, bleeding, damage to internal organs including bladder, bowels, ureters, blood vessels, other risks from anesthesia, VTE and delayed complications of any surgery, complications in future surgery reviewed.   [name] R 06/06/2013, 11:02 PM

## 2013-06-06 NOTE — Patient Instructions (Addendum)
[place]  06/06/2013   Your procedure is scheduled on:  06/15/13  Enter through the Main Entrance of [facility] up the phone at the desk and dial [phone].   Call this number if you have problems the morning of surgery: [phone]   Remember:   Do not eat food:After Midnight.  Do not drink clear liquids: until 9AM day of surgery.  Take these medicines the morning of surgery with A SIP OF WATER: NA   Do not wear jewelry, make-up or nail polish.  Do not wear lotions, powders, or perfumes. You may wear deodorant.  Do not shave 24 hours prior to surgery.  Do not bring valuables to the hospital.  [facility] is not   responsible for any belongings or valuables brought to the hospital.  Contacts, dentures or bridgework may not be worn into surgery.  Leave suitcase in the car. After surgery it may be brought to your room.  For patients admitted to the hospital, checkout time is 11:00 AM the day of              discharge.   Patients discharged the day of surgery will not be allowed to drive             home.  Name and phone number of your driver: NA  Special Instructions:      Please read over the following fact sheets that you were given:   Surgical Site Infection Prevention

## 2013-06-06 NOTE — Pre-Procedure Instructions (Signed)
Hgb of 7 called to Dr Benjie Karvonen Olivia Mackie) instructions given that pt will be contacted by Dr Benjie Karvonen for possible admission and blood transfusion. Pt informed and understands. Pt instructed to eat light lunch in case of surgery change to today.

## 2013-06-07 ENCOUNTER — Other Ambulatory Visit: Payer: 59

## 2013-06-07 ENCOUNTER — Inpatient Hospital Stay (HOSPITAL_COMMUNITY): Admission: RE | Admit: 2013-06-07 | Payer: 59 | Source: Ambulatory Visit | Admitting: Obstetrics & Gynecology

## 2013-06-07 ENCOUNTER — Encounter (HOSPITAL_COMMUNITY)
Admission: RE | Disposition: A | Discharge status: Home / Self Care | Payer: Self-pay | Source: Ambulatory Visit | Attending: Obstetrics & Gynecology

## 2013-06-07 ENCOUNTER — Encounter (HOSPITAL_COMMUNITY): Payer: Self-pay | Admitting: Anesthesiology

## 2013-06-07 ENCOUNTER — Inpatient Hospital Stay (HOSPITAL_COMMUNITY): Payer: 59 | Admitting: Anesthesiology

## 2013-06-07 ENCOUNTER — Encounter (HOSPITAL_COMMUNITY): Payer: 59 | Admitting: Anesthesiology

## 2013-06-07 DIAGNOSIS — Z90710 Acquired absence of both cervix and uterus: Secondary | ICD-10-CM

## 2013-06-07 HISTORY — PX: ABDOMINAL HYSTERECTOMY: SHX81

## 2013-06-07 HISTORY — DX: Acquired absence of both cervix and uterus: Z90.710

## 2013-06-07 LAB — CBC
HEMATOCRIT: 30.4 % — AB (ref 36.0–46.0)
Hemoglobin: 9.8 g/dL — ABNORMAL LOW (ref 12.0–15.0)
MCH: 27.1 pg (ref 26.0–34.0)
MCHC: 32.2 g/dL (ref 30.0–36.0)
MCV: 84.2 fL (ref 78.0–100.0)
PLATELETS: 142 10*3/uL — AB (ref 150–400)
RBC: 3.61 MIL/uL — ABNORMAL LOW (ref 3.87–5.11)
RDW: 17.7 % — AB (ref 11.5–15.5)
WBC: 10.1 10*3/uL (ref 4.0–10.5)

## 2013-06-07 LAB — PREPARE RBC (CROSSMATCH)

## 2013-06-07 SURGERY — HYSTERECTOMY, ABDOMINAL
Anesthesia: General

## 2013-06-07 MED ORDER — GLYCOPYRROLATE 0.2 MG/ML IJ SOLN
INTRAMUSCULAR | Status: AC
  Filled 2013-06-07: qty 3

## 2013-06-07 MED ORDER — SODIUM CHLORIDE 0.9 % IJ SOLN: 9.0000 mL | INTRAMUSCULAR | Status: DC | PRN

## 2013-06-07 MED ORDER — ADULT MULTIVITAMIN W/MINERALS CH
1.0000 | ORAL_TABLET | Freq: Every day | ORAL | Status: DC
  Administered 2013-06-09: 1 via ORAL
  Filled 2013-06-07 (×3): qty 1

## 2013-06-07 MED ORDER — METOCLOPRAMIDE HCL 5 MG/ML IJ SOLN: 10.0000 mg | Freq: Once | INTRAMUSCULAR | Status: DC | PRN

## 2013-06-07 MED ORDER — KETOROLAC TROMETHAMINE 30 MG/ML IJ SOLN: 15.0000 mg | Freq: Once | INTRAMUSCULAR | Status: DC | PRN

## 2013-06-07 MED ORDER — SODIUM CHLORIDE 0.9 % IJ SOLN
INTRAMUSCULAR | Status: AC
  Filled 2013-06-07: qty 30

## 2013-06-07 MED ORDER — LIDOCAINE HCL (CARDIAC) 20 MG/ML IV SOLN
INTRAVENOUS | Status: DC | PRN
  Administered 2013-06-07: 60 mg via INTRAVENOUS

## 2013-06-07 MED ORDER — ALBUTEROL SULFATE (2.5 MG/3ML) 0.083% IN NEBU
INHALATION_SOLUTION | RESPIRATORY_TRACT | Status: AC
  Administered 2013-06-07: 2.5 mg via RESPIRATORY_TRACT
  Filled 2013-06-07: qty 3

## 2013-06-07 MED ORDER — FENTANYL CITRATE 0.05 MG/ML IJ SOLN
INTRAMUSCULAR | Status: AC
  Administered 2013-06-07: 25 ug via INTRAVENOUS
  Filled 2013-06-07: qty 2

## 2013-06-07 MED ORDER — HYDROMORPHONE 0.3 MG/ML IV SOLN
INTRAVENOUS | Status: DC
  Administered 2013-06-07: 23:00:00 via INTRAVENOUS
  Administered 2013-06-08: 2.39 mg via INTRAVENOUS
  Administered 2013-06-08: 0.999 mg via INTRAVENOUS
  Administered 2013-06-08: 1.79 mg via INTRAVENOUS
  Filled 2013-06-07: qty 25

## 2013-06-07 MED ORDER — ROCURONIUM BROMIDE 100 MG/10ML IV SOLN
INTRAVENOUS | Status: AC
  Filled 2013-06-07: qty 1

## 2013-06-07 MED ORDER — ROPIVACAINE HCL 5 MG/ML IJ SOLN
INTRAMUSCULAR | Status: AC
  Filled 2013-06-07: qty 60

## 2013-06-07 MED ORDER — MIDAZOLAM HCL 5 MG/5ML IJ SOLN
INTRAMUSCULAR | Status: DC | PRN
  Administered 2013-06-07: 2 mg via INTRAVENOUS

## 2013-06-07 MED ORDER — HYDROMORPHONE HCL PF 1 MG/ML IJ SOLN
INTRAMUSCULAR | Status: AC
  Filled 2013-06-07: qty 1

## 2013-06-07 MED ORDER — MEPERIDINE HCL 25 MG/ML IJ SOLN: 6.2500 mg | INTRAMUSCULAR | Status: DC | PRN

## 2013-06-07 MED ORDER — DIPHENHYDRAMINE HCL 50 MG/ML IJ SOLN: 25.0000 mg | Freq: Once | INTRAMUSCULAR | Status: DC

## 2013-06-07 MED ORDER — ONDANSETRON HCL 4 MG/2ML IJ SOLN: 4.0000 mg | Freq: Four times a day (QID) | INTRAMUSCULAR | Status: DC | PRN

## 2013-06-07 MED ORDER — GLYCOPYRROLATE 0.2 MG/ML IJ SOLN
INTRAMUSCULAR | Status: AC
  Filled 2013-06-07: qty 1

## 2013-06-07 MED ORDER — ALBUTEROL SULFATE HFA 108 (90 BASE) MCG/ACT IN AERS
INHALATION_SPRAY | RESPIRATORY_TRACT | Status: DC | PRN
  Administered 2013-06-07: 2 via RESPIRATORY_TRACT

## 2013-06-07 MED ORDER — ONDANSETRON HCL 4 MG PO TABS: 4.0000 mg | ORAL_TABLET | Freq: Four times a day (QID) | ORAL | Status: DC | PRN

## 2013-06-07 MED ORDER — ONDANSETRON HCL 4 MG/2ML IJ SOLN
INTRAMUSCULAR | Status: DC | PRN
  Administered 2013-06-07: 4 mg via INTRAVENOUS

## 2013-06-07 MED ORDER — LIDOCAINE HCL (CARDIAC) 20 MG/ML IV SOLN
INTRAVENOUS | Status: AC
  Filled 2013-06-07: qty 5

## 2013-06-07 MED ORDER — PROPOFOL 10 MG/ML IV BOLUS
INTRAVENOUS | Status: DC | PRN
  Administered 2013-06-07: 150 mg via INTRAVENOUS

## 2013-06-07 MED ORDER — DIPHENHYDRAMINE HCL 50 MG/ML IJ SOLN: 12.5000 mg | Freq: Four times a day (QID) | INTRAMUSCULAR | Status: DC | PRN

## 2013-06-07 MED ORDER — FENTANYL CITRATE 0.05 MG/ML IJ SOLN
25.0000 ug | INTRAMUSCULAR | Status: DC | PRN
  Administered 2013-06-07 (×2): 25 ug via INTRAVENOUS

## 2013-06-07 MED ORDER — FENTANYL CITRATE 0.05 MG/ML IJ SOLN
INTRAMUSCULAR | Status: AC
  Filled 2013-06-07: qty 5

## 2013-06-07 MED ORDER — PROPOFOL 10 MG/ML IV EMUL
INTRAVENOUS | Status: AC
  Filled 2013-06-07: qty 20

## 2013-06-07 MED ORDER — ROCURONIUM BROMIDE 100 MG/10ML IV SOLN
INTRAVENOUS | Status: DC | PRN
  Administered 2013-06-07 (×2): 10 mg via INTRAVENOUS
  Administered 2013-06-07: 50 mg via INTRAVENOUS
  Administered 2013-06-07 (×4): 10 mg via INTRAVENOUS

## 2013-06-07 MED ORDER — OXYCODONE-ACETAMINOPHEN 5-325 MG PO TABS
1.0000 | ORAL_TABLET | ORAL | Status: DC | PRN
  Administered 2013-06-08: 1 via ORAL
  Administered 2013-06-08 (×2): 2 via ORAL
  Administered 2013-06-09: 1 via ORAL
  Administered 2013-06-09: 2 via ORAL
  Administered 2013-06-09: 1 via ORAL
  Administered 2013-06-09 – 2013-06-10 (×5): 2 via ORAL
  Filled 2013-06-07: qty 1
  Filled 2013-06-07: qty 2
  Filled 2013-06-07 (×2): qty 1
  Filled 2013-06-07 (×2): qty 2
  Filled 2013-06-07: qty 1
  Filled 2013-06-07 (×7): qty 2

## 2013-06-07 MED ORDER — DIPHENHYDRAMINE HCL 12.5 MG/5ML PO ELIX
12.5000 mg | ORAL_SOLUTION | Freq: Four times a day (QID) | ORAL | Status: DC | PRN
  Filled 2013-06-07: qty 5

## 2013-06-07 MED ORDER — NEOSTIGMINE METHYLSULFATE 1 MG/ML IJ SOLN
INTRAMUSCULAR | Status: DC | PRN
  Administered 2013-06-07 (×2): 2 mg via INTRAVENOUS

## 2013-06-07 MED ORDER — ACETAMINOPHEN 325 MG PO TABS: 325.0000 mg | ORAL_TABLET | ORAL | Status: DC | PRN

## 2013-06-07 MED ORDER — HYDROMORPHONE HCL PF 1 MG/ML IJ SOLN
INTRAMUSCULAR | Status: AC
Start: 2013-06-07 — End: 2013-06-07
  Filled 2013-06-07: qty 1

## 2013-06-07 MED ORDER — KETOROLAC TROMETHAMINE 30 MG/ML IJ SOLN
INTRAMUSCULAR | Status: AC
  Filled 2013-06-07: qty 1

## 2013-06-07 MED ORDER — ALBUTEROL SULFATE (2.5 MG/3ML) 0.083% IN NEBU: 2.5000 mg | INHALATION_SOLUTION | Freq: Once | RESPIRATORY_TRACT | Status: DC

## 2013-06-07 MED ORDER — LACTATED RINGERS IV SOLN
INTRAVENOUS | Status: DC
  Administered 2013-06-07 (×5): via INTRAVENOUS

## 2013-06-07 MED ORDER — FERROUS SULFATE 325 (65 FE) MG PO TABS
325.0000 mg | ORAL_TABLET | Freq: Three times a day (TID) | ORAL | Status: DC
  Administered 2013-06-08 – 2013-06-10 (×7): 325 mg via ORAL
  Filled 2013-06-07 (×7): qty 1

## 2013-06-07 MED ORDER — LABETALOL HCL 5 MG/ML IV SOLN
INTRAVENOUS | Status: AC
  Filled 2013-06-07: qty 4

## 2013-06-07 MED ORDER — DEXAMETHASONE SODIUM PHOSPHATE 10 MG/ML IJ SOLN
INTRAMUSCULAR | Status: AC
  Filled 2013-06-07: qty 1

## 2013-06-07 MED ORDER — KETOROLAC TROMETHAMINE 30 MG/ML IJ SOLN
30.0000 mg | Freq: Once | INTRAMUSCULAR | Status: AC
  Administered 2013-06-07: 30 mg via INTRAVENOUS

## 2013-06-07 MED ORDER — NALOXONE HCL 0.4 MG/ML IJ SOLN: 0.4000 mg | INTRAMUSCULAR | Status: DC | PRN

## 2013-06-07 MED ORDER — ALBUTEROL SULFATE HFA 108 (90 BASE) MCG/ACT IN AERS
INHALATION_SPRAY | RESPIRATORY_TRACT | Status: AC
  Filled 2013-06-07: qty 6.7

## 2013-06-07 MED ORDER — FENTANYL CITRATE 0.05 MG/ML IJ SOLN
INTRAMUSCULAR | Status: DC | PRN
  Administered 2013-06-07: 150 ug via INTRAVENOUS
  Administered 2013-06-07: 50 ug via INTRAVENOUS
  Administered 2013-06-07: 100 ug via INTRAVENOUS
  Administered 2013-06-07 (×2): 50 ug via INTRAVENOUS

## 2013-06-07 MED ORDER — ONDANSETRON HCL 4 MG/2ML IJ SOLN
INTRAMUSCULAR | Status: AC
  Filled 2013-06-07: qty 2

## 2013-06-07 MED ORDER — MIDAZOLAM HCL 2 MG/2ML IJ SOLN
INTRAMUSCULAR | Status: AC
  Filled 2013-06-07: qty 2

## 2013-06-07 MED ORDER — ACETAMINOPHEN 160 MG/5ML PO SOLN
325.0000 mg | ORAL | Status: DC | PRN
Start: 2013-06-07 — End: 2013-06-07

## 2013-06-07 MED ORDER — GUAIFENESIN 100 MG/5ML PO SOLN: 10.0000 mL | ORAL | Status: DC | PRN

## 2013-06-07 MED ORDER — ROPIVACAINE HCL 5 MG/ML IJ SOLN
INTRAMUSCULAR | Status: DC | PRN
  Administered 2013-06-07: 27 mL via EPIDURAL

## 2013-06-07 MED ORDER — HYDROMORPHONE HCL PF 1 MG/ML IJ SOLN
INTRAMUSCULAR | Status: DC | PRN
  Administered 2013-06-07 (×2): 0.5 mg via INTRAVENOUS

## 2013-06-07 MED ORDER — ALBUTEROL SULFATE (2.5 MG/3ML) 0.083% IN NEBU
2.5000 mg | INHALATION_SOLUTION | Freq: Once | RESPIRATORY_TRACT | Status: AC
  Administered 2013-06-07 (×2): 2.5 mg via RESPIRATORY_TRACT

## 2013-06-07 MED ORDER — MENTHOL 3 MG MT LOZG
1.0000 | LOZENGE | OROMUCOSAL | Status: DC | PRN
  Administered 2013-06-08: 3 mg via ORAL
  Filled 2013-06-07: qty 9

## 2013-06-07 MED ORDER — LACTATED RINGERS IV SOLN
INTRAVENOUS | Status: DC
  Administered 2013-06-07 – 2013-06-08 (×2): via INTRAVENOUS

## 2013-06-07 MED ORDER — GLYCOPYRROLATE 0.2 MG/ML IJ SOLN
INTRAMUSCULAR | Status: DC | PRN
  Administered 2013-06-07: 0.4 mg via INTRAVENOUS
  Administered 2013-06-07: 0.1 mg via INTRAVENOUS
  Administered 2013-06-07: 0.4 mg via INTRAVENOUS

## 2013-06-07 MED ORDER — DEXAMETHASONE SODIUM PHOSPHATE 10 MG/ML IJ SOLN
INTRAMUSCULAR | Status: DC | PRN
  Administered 2013-06-07: 10 mg via INTRAVENOUS

## 2013-06-07 MED ORDER — HYDROMORPHONE HCL PF 1 MG/ML IJ SOLN: 0.2500 mg | INTRAMUSCULAR | Status: DC | PRN

## 2013-06-07 SURGICAL SUPPLY — 41 items
BENZOIN TINCTURE PRP APPL 2/3 (GAUZE/BANDAGES/DRESSINGS) ×2 IMPLANT
CANISTER SUCT 3000ML (MISCELLANEOUS) ×2 IMPLANT
CHLORAPREP W/TINT 26ML (MISCELLANEOUS) ×2 IMPLANT
CLOTH BEACON ORANGE TIMEOUT ST (SAFETY) ×2 IMPLANT
CONT PATH 16OZ SNAP LID 3702 (MISCELLANEOUS) ×2 IMPLANT
DRAPE WARM FLUID 44X44 (DRAPE) IMPLANT
DRSG OPSITE 11X17.75 LRG (GAUZE/BANDAGES/DRESSINGS) ×2 IMPLANT
DRSG OPSITE 6X11 MED (GAUZE/BANDAGES/DRESSINGS) ×2 IMPLANT
ELECT LIGASURE LONG (ELECTRODE) ×2 IMPLANT
GAUZE SPONGE 4X4 16PLY XRAY LF (GAUZE/BANDAGES/DRESSINGS) ×2 IMPLANT
GLOVE BIO SURGEON STRL SZ7 (GLOVE) ×2 IMPLANT
GLOVE BIOGEL PI IND STRL 7.0 (GLOVE) ×1 IMPLANT
GLOVE BIOGEL PI INDICATOR 7.0 (GLOVE) ×1
GOWN STRL REUS W/TWL LRG LVL3 (GOWN DISPOSABLE) ×6 IMPLANT
NDL SAFETY ECLIPSE 18X1.5 (NEEDLE) ×1 IMPLANT
NEEDLE HYPO 18GX1.5 SHARP (NEEDLE) ×1
NS IRRIG 1000ML POUR BTL (IV SOLUTION) ×2 IMPLANT
PACK ABDOMINAL GYN (CUSTOM PROCEDURE TRAY) ×2 IMPLANT
PAD ABD 7.5X8 STRL (GAUZE/BANDAGES/DRESSINGS) ×4 IMPLANT
PAD OB MATERNITY 4.3X12.25 (PERSONAL CARE ITEMS) ×2 IMPLANT
PROTECTOR NERVE ULNAR (MISCELLANEOUS) ×2 IMPLANT
SPONGE GAUZE 4X4 12PLY (GAUZE/BANDAGES/DRESSINGS) ×4 IMPLANT
SPONGE LAP 18X18 X RAY DECT (DISPOSABLE) ×4 IMPLANT
STAPLER VISISTAT 35W (STAPLE) IMPLANT
STRIP CLOSURE SKIN 1/2X4 (GAUZE/BANDAGES/DRESSINGS) ×4 IMPLANT
SUT PLAIN 2 0 XLH (SUTURE) ×2 IMPLANT
SUT PROLENE 0 CT 1 30 (SUTURE) IMPLANT
SUT VIC AB 0 CT1 18XCR BRD8 (SUTURE) ×2 IMPLANT
SUT VIC AB 0 CT1 27 (SUTURE) ×2
SUT VIC AB 0 CT1 27XCR 8 STRN (SUTURE) ×2 IMPLANT
SUT VIC AB 0 CT1 36 (SUTURE) ×12 IMPLANT
SUT VIC AB 0 CT1 8-18 (SUTURE) ×2
SUT VIC AB 0 CT2 27 (SUTURE) ×4 IMPLANT
SUT VIC AB 4-0 KS 27 (SUTURE) ×2 IMPLANT
SUT VIC AB 4-0 SH 18 (SUTURE) IMPLANT
SUT VICRYL 0 TIES 12 18 (SUTURE) ×2 IMPLANT
SYR CONTROL 10ML LL (SYRINGE) ×2 IMPLANT
TAPE HYPAFIX 4 X10 (GAUZE/BANDAGES/DRESSINGS) ×2 IMPLANT
TOWEL OR 17X24 6PK STRL BLUE (TOWEL DISPOSABLE) ×4 IMPLANT
TRAY FOLEY CATH 14FR (SET/KITS/TRAYS/PACK) ×2 IMPLANT
WATER STERILE IRR 1000ML POUR (IV SOLUTION) ×2 IMPLANT

## 2013-06-07 NOTE — Op Note (Signed)
PRE-OPERATIVE DIAGNOSIS:  Uterine Fibroids  [address]  POST-OPERATIVE DIAGNOSIS:  Uterine Fibroids ( specimen weight 603 gm)  PROCEDURE:  TOTAL ABDOMINAL HYSTERECTOMY, BILATERAL SALPINGECTOMY  SURGEON: Elveria Royals, MD  ASSISTANT:  Eduard Clos, MD  ANESTHESIA:  General endotracheal  EBL: 600 cc  IVF: 4000 LR   Urine output: urine in foley: 215 cc concentrated but clear  BLOOD ADMINISTERED: none in surgery  DRAINS: Urinary Catheter (Foley)   LOCAL MEDICATIONS USED:  Ropivacaine in skin at the end of surgery  SPECIMEN:  Uterus with fibroids, cervix and bilateral fallopian tubes  DISPOSITION OF SPECIMEN:  PATHOLOGY  COUNTS:  YES  PATIENT DISPOSITION:  PACU - hemodynamically stable but very drowsy. Then admit for post-op care.   Delay start of Pharmacological VTE agent (>24hrs) due to surgical blood loss or risk of bleeding: yes  PROCEDURE:   Indication:  Symptomatic uterine fibroids with recurrent menorrhagia and anemia needing blood transfusions. Abdominal hysterectomy was planned after reviewing risks/benefits and salpingectomy was discussed and agreed. Risks and complications of surgery including infection, bleeding, damage to internal organs and other including but not limited to surgery related problems including pneumonia, VTE reviewed. Informed written consent was obtained. Patient was admitted pre-operatively and received 3 units packed red cell transfusion.  Patient was brought to the operating room with IV running. Time out was carried out and above information confirmed. She received Gentamicin/ Clindamycin per protocol.. Underwent general anesthesia without difficulty and was given dorsal supine position, prepped and draped in sterile fashion. Foley catheter was placed. Exam under anesthesia noted uterus at the 3 cm below the umbilicus with difficult mobility. Pfannenstiel incision was made above the prior cesarean scar and carried down to the underlying fascia  with Bovie with excellent hemostasis. Fascia incised and extended laterally. Fascia grasped with Kocher's and underlying rectus muscles were noted to be densely adherent and were dissected down with Bovie. Rectus muscles were separated in midline. Peritoneal entry was made after dissecting the preperitoneal fat. Large fibroid uterus was palpated that was difficult to mobilize due to its size and patient body habitus. Anterior adhesions noted to the bladder. Upper abdomen felt normal. Uterus was delivered out of the incision with manipulation after grasping with tenaculums. Fallopian tubes and both ovaries appear normal though left tube was adherent to lateral pelvic wall.  Bilateral round ligaments were short and difficult to see. Left side was more easily accessible. Left round ligament was double clamped, cut and suture transfixed with 0-vicryl. Anterior and posterior broad ligaments were dissected. Bladder was densely adherent in the midline to lower uterine/ cervix area. Sharp dissection was done on the left side and bladder flap was created. Now the right round ligament was identified with some difficulty and doubly clamped and cut and suture transfixed. Posterior dissection and a window was was created in posterior broad ligament and right fallopian tube and right utero-ovarian ligament was clamped with Heaney clamps x 2 and cut in between and suture transfixed with 0-vicryl. Anterior dissection was done to meet the opposite side and bladder was further dissected down by sharp dissection. Now the left broad ligament was dissected and left fallopian tube and utero-ovarian ligament was desiccated with Ligasure and cut with excellent hemostasis. Posterior broad ligament was dissected bilaterally and uterine vessels were skeletonized. Bilateral uterine vessels were desiccated with Ligasure and cut. A straight Heaney applied bilaterally on cardinal ligaments and cut and suture transfixed with 0-Vicryl. Uterus was  amputated above the cervix and passed off and cervical  stump was grasped with tenaculums. Hemostasis was observed. Straight Heaney clamps applied bilaterally in several steps (long supravaginal cervix) and bilateral Cardinal ligaments cut and transfixed with 0 Vicryl. This dissection was very difficult due to deep pelvis and large body habitus. Bilateral curved Heaney's applied to vaginal angles with Uterosacral ligament and cut and transfixed. Vaginal opening noted, was cut circumferentially. Vaginal cut was closed from one angle to the other with 0 Vicryl interlocking sutures Several figure of 8 sutures taken again in the end due to bleeding from the cuff and excellent hemostasis noted. Dr Garwin Brothers scrubbed out and CNM Bhambri joined to complete the rest. Bilateral salpingectomy was performed with Ligasure. Left ovarian vessel from the utero-ovarian pedical was bleeding and was cauterized. All pedicles appeared dry. Ovaries looked hemostatic and normal. Cuff inspected again and hemostasis noted.  Peritoneal edges grasped and peritoneum closed with 2-0 Vicryl. Fascia sutured with 0 Vicryl from two ends and met in midline. Subcutaneous layer was deep and closed with 2-0 Plain gut. Skin approximated with 4-0 Vicryl in subcuticular fashion. Sterile dressing placed.  Sterile Honeycomb dressing placed.   All  Instruments/ lap/ sponges counts were correct x2. No complications. I performed the surgery, assisted by Dr Garwin Brothers.  Manon Hilding, MD

## 2013-06-07 NOTE — Addendum Note (Signed)
Addendum created 06/07/13 2308 by Venia Carbon. Royce Macadamia, MD   Modules edited: Orders

## 2013-06-07 NOTE — Anesthesia Preprocedure Evaluation (Signed)
Anesthesia Evaluation  Patient identified by MRN, date of birth, ID band Patient awake    Reviewed: Allergy & Precautions, H&P , NPO status , Patient's Chart, lab work & pertinent test results, reviewed documented beta blocker date and time   History of Anesthesia Complications Negative for: history of anesthetic complications  Airway       Dental  (+) Teeth Intact   Pulmonary neg pulmonary ROS, former smoker, PE breath sounds clear to auscultation  Pulmonary exam normal       Cardiovascular DVT (coudmadin stopped in 1/15) Rhythm:regular Rate:Normal     Neuro/Psych negative neurological ROS  negative psych ROS   GI/Hepatic negative GI ROS, Neg liver ROS,   Endo/Other  Morbid obesity  Renal/GU negative Renal ROS  Female GU complaint     Musculoskeletal   Abdominal   Peds  Hematology  (+) anemia , S/p 3 units last night (hgb 7->9.8)   Anesthesia Other Findings   Reproductive/Obstetrics negative OB ROS                           Anesthesia Physical Anesthesia Plan  ASA: III  Anesthesia Plan: General ETT   Post-op Pain Management:    Induction:   Airway Management Planned:   Additional Equipment:   Intra-op Plan:   Post-operative Plan:   Informed Consent: I have reviewed the patients History and Physical, chart, labs and discussed the procedure including the risks, benefits and alternatives for the proposed anesthesia with the patient or authorized representative who has indicated his/her understanding and acceptance.   Dental Advisory Given  Plan Discussed with: CRNA and Surgeon  Anesthesia Plan Comments:         Anesthesia Quick Evaluation

## 2013-06-07 NOTE — Transfer of Care (Signed)
Immediate Anesthesia Transfer of Care Note  Patient: [name]  Procedure(s) Performed: Procedure(s): HYSTERECTOMY ABDOMINAL WITH BIALTERAL SALPINGECTOMY (N/A)  Patient Location: PACU  Anesthesia Type:General  Level of Consciousness: sedated  Airway & Oxygen Therapy: Patient Spontanous Breathing, Patient connected to face mask oxygen and non-rebreather face mask  Post-op Assessment: Report given to PACU RN and Post -op Vital signs reviewed and stable  Post vital signs: Reviewed and stable  Complications: No apparent anesthesia complications

## 2013-06-07 NOTE — Progress Notes (Addendum)
Patient ID: [name], female   DOB: 05/04/1965, 48 y.o.   MRN: [id number] Subjective: S/p 3 units packed red cells overnight, tolerated well. No complaints today, vaginal bleeding mild. NPO since 5 am.   Objective: Vital signs in last 24 hours: Temp:  [97.4 F (36.3 C)-98.6 F (37 C)] 97.9 F (36.6 C) (03/11 0933) Pulse Rate:  [77-109] 86 (03/11 0933) Resp:  [18-20] 18 (03/11 0933) BP: (114-164)/(56-97) 130/72 mmHg (03/11 0933) SpO2:  [95 %-100 %] 96 % (03/11 0933) Weight:  [254 lb 12.8 oz (115.577 kg)] 254 lb 12.8 oz (115.577 kg) (03/10 2042) Weight change:  Last BM Date: 06/06/13  Intake/Output from previous day: 03/10 0701 - 03/11 0700 In: 670 [Blood:670] Out: -  Intake/Output this shift: Total I/O In: 335 [Blood:335] Out: -   Physical exam:  A&O x 3, no acute distress. Pleasant Lungs CTA bilat CV RRR, S1S2 normal Abdo soft, non tender, non acute, 20 wk uterus Extr no edema/ tenderness Pelvic deferred  Lab Results: repeat CBC at 12 noon- Hgb 9.8   Assessment/Plan: Symptomatic uterine fibroids with anemia, s/p 3 units packed RBCs transfusion. 1 more on hold, will transfuse post-op.  Plan TAH/bilat salpingectomy today.  Risks/complications of surgery reviewed incl infection, bleeding, damage to internal organs including bladder, bowels, ureters, blood vessels, other risks from anesthesia, VTE and delayed complications of any surgery, complications in future surgery reviewed.  LOS: 1 day   Tannis Burstein R 06/07/2013, 11:24 AM

## 2013-06-07 NOTE — Anesthesia Postprocedure Evaluation (Signed)
  Anesthesia Post-op Note  Patient: [name]  Procedure(s) Performed: Procedure(s): HYSTERECTOMY ABDOMINAL WITH BIALTERAL SALPINGECTOMY (N/A)  Patient Location: PACU  Anesthesia Type:General  Level of Consciousness: awake, alert  and oriented  Airway and Oxygen Therapy: Patient Spontanous Breathing and Patient connected to face mask  Post-op Pain: 5 /10, mild  Post-op Assessment: Post-op Vital signs reviewed, Patient's Cardiovascular Status Stable, Respiratory Function Stable, Patent Airway, No signs of Nausea or vomiting and Pain level controlled  Post-op Vital Signs: Reviewed and stable   Complications: No apparent anesthesia complications. Patient had emergence noted for desaturation. Resolved with positive pressure ventilation. See Dr. Aldean Ast note. Patient has probable undiagnosed OSA. Will place on BiPAP for tonight.

## 2013-06-07 NOTE — Progress Notes (Addendum)
Attempted to start patient's first unit of blood ( unit # [id number]) at 2237. Blood began transfusing and IV infiltrated. AICU RN restarted another IV and blood was restarted. 2nd IV also infiltrated.  Blood was again stopped and house coverage attempted and started and IV. 30 minute time limit had elapsed before an IV was placed so blood bank was called and notified. Blood was taken back and left with blood bank. Order put in to prepare another unit of PRBC.  First unit of blood ( unit # [id number])  was started  at 2348. Will continue to monitor patient.

## 2013-06-08 ENCOUNTER — Encounter (HOSPITAL_COMMUNITY): Payer: Self-pay | Admitting: Obstetrics & Gynecology

## 2013-06-08 LAB — CBC
HCT: 29.6 % — ABNORMAL LOW (ref 36.0–46.0)
Hemoglobin: 9.1 g/dL — ABNORMAL LOW (ref 12.0–15.0)
MCH: 26.3 pg (ref 26.0–34.0)
MCHC: 30.7 g/dL (ref 30.0–36.0)
MCV: 85.5 fL (ref 78.0–100.0)
PLATELETS: 111 10*3/uL — AB (ref 150–400)
RBC: 3.46 MIL/uL — ABNORMAL LOW (ref 3.87–5.11)
RDW: 18.3 % — ABNORMAL HIGH (ref 11.5–15.5)
WBC: 14.6 10*3/uL — ABNORMAL HIGH (ref 4.0–10.5)

## 2013-06-08 LAB — PREPARE RBC (CROSSMATCH)

## 2013-06-08 LAB — BASIC METABOLIC PANEL
BUN: 10 mg/dL (ref 6–23)
CALCIUM: 8.5 mg/dL (ref 8.4–10.5)
CO2: 24 mEq/L (ref 19–32)
Chloride: 97 mEq/L (ref 96–112)
Creatinine, Ser: 0.83 mg/dL (ref 0.50–1.10)
GFR calc Af Amer: >90 mL/min (ref >90)
GFR, EST NON AFRICAN AMERICAN: 83 mL/min — AB (ref >90)
Glucose, Bld: 237 mg/dL — ABNORMAL HIGH (ref 70–99)
Potassium: 5.1 mEq/L (ref 3.7–5.3)
SODIUM: 134 meq/L — AB (ref 137–147)

## 2013-06-08 MED ORDER — IBUPROFEN 600 MG PO TABS
600.0000 mg | ORAL_TABLET | Freq: Four times a day (QID) | ORAL | Status: DC | PRN
  Administered 2013-06-08 – 2013-06-10 (×6): 600 mg via ORAL
  Filled 2013-06-08 (×6): qty 1

## 2013-06-08 NOTE — Addendum Note (Signed)
Addendum created 06/08/13 0600 by Venia Carbon. Royce Macadamia, MD   Modules edited: Notes Section   Notes Section:  File: [id number]

## 2013-06-08 NOTE — Anesthesia Postprocedure Evaluation (Signed)
  Anesthesia Post-op Note  Patient: [name]  Procedure(s) Performed: Procedure(s): HYSTERECTOMY ABDOMINAL WITH BIALTERAL SALPINGECTOMY (N/A)  Patient Location: ICU  Anesthesia Type:General  Level of Consciousness: awake, alert  and oriented  Airway and Oxygen Therapy: Patient Spontanous Breathing and Patient connected to nasal cannula oxygen  Post-op Pain: none  Post-op Assessment: Post-op Vital signs reviewed, Patient's Cardiovascular Status Stable, Respiratory Function Stable and Pain level controlled  Post-op Vital Signs: Reviewed and stable  Complications: No apparent anesthesia complications

## 2013-06-08 NOTE — Addendum Note (Signed)
Addendum created 06/08/13 0816 by Jonna Munro, CRNA   Modules edited: Notes Section   Notes Section:  File: [id number]

## 2013-06-08 NOTE — Progress Notes (Signed)
Went to check on patient and was off of CPAP.  Patient on Nasal Cannula @ 5 lpm and was very alert and in NAD.  SaO2 were 100%.  Decreased liter flow slowly down to 3 lpm.  CPAP pulled from bedside per Dr. Royce Macadamia.

## 2013-06-08 NOTE — Progress Notes (Signed)
Patient awake,alert and oriented. ET CO2 being monitored. Patient states she snores loudly and is frequently startled awake and feels she has stopped breathing. Advised patient she needs to get sleep study done and probably needs CPAP titration. O2 sats 100%.

## 2013-06-08 NOTE — Progress Notes (Signed)
Pt stated that CPAP was irritating and she is awake now.  CPAP left off.

## 2013-06-08 NOTE — Progress Notes (Signed)
Patient ID: [name], female   DOB: Feb 18, 1966, 48 y.o.   MRN: [id number] Subjective: C/o incisional pain and SOB when ambulates but overall feels well. Pain controls with meds. Tolerated gen diet for BF, voiding, ambulating with assist, no vaginal bleeding. Cough with mucus.  Respiratory problems overnight reviewed with Anesthesia, will need evaluation for Sleep Apnea and will schedule through PCP. O2 sat on RA in upper 80s to low 90 and improve with deep breaths and with O2 ([place])  Objective: Vital signs in last 24 hours: Temp:  [97.4 F (36.3 C)-98.5 F (36.9 C)] 97.4 F (36.3 C) (03/12 1252) Pulse Rate:  [81-116] 87 (03/12 1252) Resp:  [15-36] 18 (03/12 1252) BP: (131-174)/(68-103) 131/76 mmHg (03/12 1252) SpO2:  [90 %-100 %] 98 % (03/12 1252) Weight:  [260 lb 0.3 oz (117.944 kg)] 260 lb 0.3 oz (117.944 kg) (03/12 0515) Weight change: 5 lb 3.5 oz (2.367 kg) Last BM Date: 06/07/13  Intake/Output from previous day: 03/11 0701 - 03/12 0700 In: 6265.9 [P.O.:480; I.V.:5450.9; Blood:335] Out: 9494 [IDXFP:8441; Blood:600] Intake/Output this shift: Total I/O In: 995.6 [P.O.:200; I.V.:795.6] Out: 350 [Urine:350]  General appearance: alert and cooperative Head: Normocephalic, without obvious abnormality, atraumatic Resp: rales base - left and bilaterally Chest wall: no tenderness Cardio: regular rate and rhythm, S1, S2 normal, no murmur, click, rub or gallop GI: soft, non-tender; bowel sounds normal; no masses,  no organomegaly and incision with dressing, clear Pelvic: cervix normal in appearance, external genitalia normal, no adnexal masses or tenderness, no cervical motion tenderness, rectovaginal septum normal, uterus normal size, shape, and consistency, vagina normal without discharge and no bleeding reported Extremities: extremities normal, atraumatic, no cyanosis or edema, Homans sign is negative, no sign of DVT and no edema, redness or tenderness in the calves or  thighs Pulses: 2+ and symmetric Skin: Skin color, texture, turgor normal. No rashes or lesions Incision/Wound:  Lab Results:  Recent Labs  06/07/13 1235 06/08/13 0520  WBC 10.1 14.6*  HGB 9.8* 9.1*  HCT 30.4* 29.6*  PLT 142* 111*   BMET  Recent Labs  06/06/13 1140 06/08/13 0520  NA 140 134*  K 4.3 5.1  CL 103 97  CO2 26 24  GLUCOSE 161* 237*  BUN 5* 10  CREATININE 0.66 0.83  CALCIUM 9.2 8.5    Assessment/Plan:  LOS: 2 days  S/p TAH/ bilateral salpingectomy and pre-op 3 units pRBC transfusion for anemia.  Post-op prolonged respiratory suppression due to drowsiness and is doing better but will need evaluation outpatient, d/w pt, understands and agrees.  Post op anemia stable, but transfuse 1 more unit to help with post-op recovery, esp tissue oxygenation.  Elevated glucose, is random, repeat fasting tomorrow.  Elevated BP but better, no HTN hx, will need post-op evaluation   Op findings reviewed. Nellie Pester R 06/08/2013, 1:43 PM

## 2013-06-09 ENCOUNTER — Other Ambulatory Visit: Payer: Self-pay

## 2013-06-09 LAB — CBC
HCT: 31.1 % — ABNORMAL LOW (ref 36.0–46.0)
HEMOGLOBIN: 9.3 g/dL — AB (ref 12.0–15.0)
MCH: 26.6 pg (ref 26.0–34.0)
MCHC: 29.9 g/dL — ABNORMAL LOW (ref 30.0–36.0)
MCV: 89.1 fL (ref 78.0–100.0)
Platelets: 137 10*3/uL — ABNORMAL LOW (ref 150–400)
RBC: 3.49 MIL/uL — ABNORMAL LOW (ref 3.87–5.11)
RDW: 18.4 % — ABNORMAL HIGH (ref 11.5–15.5)
WBC: 10.5 10*3/uL (ref 4.0–10.5)

## 2013-06-09 LAB — GLUCOSE, RANDOM: GLUCOSE: 242 mg/dL — AB (ref 70–99)

## 2013-06-09 MED ORDER — METFORMIN HCL 500 MG PO TABS
500.0000 mg | ORAL_TABLET | Freq: Two times a day (BID) | ORAL | Status: DC
  Administered 2013-06-09 – 2013-06-10 (×2): 500 mg via ORAL
  Filled 2013-06-09 (×2): qty 1

## 2013-06-09 NOTE — Progress Notes (Signed)
Patient ID: [name], female   DOB: March 21, 1966, 49 y.o.   MRN: [id number] Subjective: Moving well with pain meds, flatus +. No vag blding. Breathing is better, coughing and using IS.   Objective: Vital signs in last 24 hours: Temp:  [97.6 F (36.4 C)-98.4 F (36.9 C)] 98.1 F (36.7 C) (03/13 1812) Pulse Rate:  [79-87] 87 (03/13 1812) Resp:  [18-24] 20 (03/13 1812) BP: (130-145)/(64-88) 132/78 mmHg (03/13 1812) SpO2:  [93 %-100 %] 94 % (03/13 1812) Weight change:  Last BM Date: 06/07/13  Intake/Output from previous day: 03/12 0701 - 03/13 0700 In: 995.6 [P.O.:200; I.V.:795.6] Out: 1051 [Urine:1050; Stool:1] Intake/Output this shift:    General appearance: alert and cooperative Head: Normocephalic, without obvious abnormality, asymmetric shape, atraumatic Resp: clear to auscultation bilaterally Cardio: regular rate and rhythm, S1, S2 normal, no murmur, click, rub or gallop GI: soft, non-tender; bowel sounds normal; no masses,  no organomegaly Pelvic: deferred Extremities: extremities normal, atraumatic, no cyanosis or edema Incision/Wound:  Lab Results:  Recent Labs  06/08/13 0520 06/09/13 0510  WBC 14.6* 10.5  HGB 9.1* 9.3*  HCT 29.6* 31.1*  PLT 111* 137*   BMET  Recent Labs  06/08/13 0520 06/09/13 0510  NA 134*  --   K 5.1  --   CL 97  --   CO2 24  --   GLUCOSE 237* 242*  BUN 10  --   CREATININE 0.83  --   CALCIUM 8.5  --    Medications: I have reviewed the patient's current medications.  Assessment/Plan: S/p TAH/ bilateral salpingectomy, s/p blood transfusion total 4 pRBCs (3 preop and 1 post op, tolerated well) Anemia stable, and pt feels better. Pain mngmt better. PO now.  Respiratory status better but snore and apnea when sleeps. Needs outpt sleep studies and possible C-Pap Elevated glucose, no prior DM diagnosis, sees [facility] PCP, high fasting today, needs A1C and will start Metformin 564m BID for now.  BP stable  Anticipate D/c home tomorrow  with family.    LOS: 3 days   Angelina Venard R 06/09/2013, 9:13 PM

## 2013-06-09 NOTE — Progress Notes (Signed)
[name] was still in quite a bit of pain, but she is grateful for all the care that she has received.  This has been a long process for her because of the 7 months of bleeding and 3 transfusions that she reported experiencing before the procedure.  She was appreciative of having me check in on her.  She will have help from family after discharge.    [place] Pager, [phone] 11:29 AM   06/09/13 1100  Clinical Encounter Type  Visited With Patient and family together  Visit Type Spiritual support  Referral From Nurse (RN, [facility])  Spiritual Encounters  Spiritual Needs Emotional

## 2013-06-10 LAB — TYPE AND SCREEN
ABO/RH(D): O POS
ANTIBODY SCREEN: NEGATIVE
UNIT DIVISION: 0
Unit division: 0
Unit division: 0
Unit division: 0
Unit division: 0
Unit division: 0
Unit division: 0

## 2013-06-10 LAB — HEMOGLOBIN A1C
Hgb A1c MFr Bld: 6.2 % — ABNORMAL HIGH (ref <5.7)
MEAN PLASMA GLUCOSE: 131 mg/dL — AB (ref <117)

## 2013-06-10 MED ORDER — HYDROCODONE-ACETAMINOPHEN 7.5-325 MG PO TABS: 1.0000 | ORAL_TABLET | Freq: Four times a day (QID) | ORAL | Status: DC | PRN

## 2013-06-10 MED ORDER — DOCUSATE SODIUM 100 MG PO CAPS: 100.0000 mg | ORAL_CAPSULE | Freq: Two times a day (BID) | ORAL | Status: DC

## 2013-06-10 MED ORDER — IBUPROFEN 200 MG PO TABS: 800.0000 mg | ORAL_TABLET | Freq: Two times a day (BID) | ORAL | Status: DC | PRN

## 2013-06-10 NOTE — Discharge Summary (Signed)
Physician Discharge Summary  Patient ID: [name] MRN: [id number] DOB/AGE: 48-Sep-1967 48 y.o.  Admit date: 06/06/2013 Discharge date: 06/10/2013  Admission Diagnoses: Symptomatic Anemia secondary to menorrhagia from Fibroids                                                                                Discharge Diagnoses:  Active Problems:   Anemia   S/P TAH (total abdominal hysterectomy) S/p 4 units packed red cell transfusion  Discharged Condition: good  Hospital Course: 48 yo AAF, G2P1011, with large uterine fibroids and anemia due to menorrhagia was admitted emergently for Hemoglobin on 7.1 at her pre-operative visit. Decision was made to proceed with blood transfusion and schedule surgery the following day. Patient concurred. She was admitted on 06/06/13 and received 3 units of packed RBCs transfusion with 1 dose of Lasix 65m and 1 dose of Benadryl without complications. She then underwent Total Abdominal Hysterectomy and bilateral salpingectomy on 06/07/13. Surgery was uncomplicated except for longer surgery duration due to body habitus and enlarged uterus. Patient had post-operative respiratory depression and somnolence after she was extubated in the OR and stayed in PACU for longer duration but was stable and [facility] She was given 1/2 dose PCA and then switched to oral pain medications (Percocet and Ibuprofen) on post-op day 1. She also received 4th unit of packed RBCs on POD#1 since Hgb was 9 and it improved to 9.3 and so did her overall status. Vital remained stable. Patient progressed well, eating, ambulating and managing pain control with PO meds. Her post-op glucose was elevated in 230 range, but Hemoglobin A1C 6.2 and fasting glucose on POD3 was 131. She was given 1 metformin dose on POD2 but was discontinued when A1C was back. [facility]   Discharge Exam: Blood pressure 137/77, pulse  79, temperature 98.3 F (36.8 C), temperature source Oral, resp. rate 18, height _0  (1.651 m), weight 260 lb 0.3 oz (117.944 kg), last menstrual period 05/11/2013, SpO2 98.00%. Exam normal, incision clean and intact.   Disposition: 01-Home or Self Care   Future Appointments Provider Department Dept Phone   07/06/2013 8:30 AM [phone]  07/06/2013 9:00 AM [facility] [phone]  08/15/2013 11:30 AM [facility] [place] [facility] [phone]      Medication List    ASK your doctor about these medications       acetaminophen 500 MG tablet  Commonly known as:  TYLENOL  Take 1,000 mg by mouth every 6 (six) hours as needed for mild pain, fever or headache.     ferrous sulfate 325 (65 FE) MG tablet  Take 325 mg by mouth 3 (three) times daily with meals.     guaiFENesin 100 MG/5ML Soln  Commonly known as:  ROBITUSSIN  Take 10 mLs by mouth every 4 (four) hours as needed for cough.     ibuprofen 200 MG tablet  Commonly known as:  ADVIL,MOTRIN  Take 800 mg by mouth 2 (two) times  daily as needed for moderate pain.     multivitamin with minerals Tabs tablet  Take 1 tablet by mouth daily.     norethindrone 5 MG tablet  Commonly known as:  AYGESTIN  Take 2 tablets (10 mg total) by mouth daily. Take twice a day until 2/18 and one tablet daily starting 2/19.      F/up in office in 2 weeks. Post-op care and reportable symptoms reviewed.    Signed: Jaidah Lomax R 06/10/2013, 9:55 AM

## 2013-06-10 NOTE — Progress Notes (Signed)
3 Days Post-Op Procedure(s) (LRB): HYSTERECTOMY ABDOMINAL WITH BIALTERAL SALPINGECTOMY (N/A)  Subjective: Patient reports incisional pain, tolerating PO, + flatus and no problems voiding.  Ambulating. No vaginal bleeding. No SOB/CP/ dizziness. Feels well.   Objective: I have reviewed patient's vital signs, intake and output, medications and labs. BP 137/77  Pulse 79  Temp(Src) 98.3 F (36.8 C) (Oral)  Resp 18  Ht _0  (1.651 m)  Wt 260 lb 0.3 oz (117.944 kg)  BMI 43.27 kg/m2  SpO2 98%  LMP 05/11/2013  General: alert and cooperative Resp: clear to auscultation bilaterally and normal percussion bilaterally CV RRR Abdo - obese. Incision- moist Dressing removed. RN advised to replace clear steristrips and new Honeycomb dressing. Active normal BS Extr no CT bilateral  Assessment: s/p Procedure(s): HYSTERECTOMY ABDOMINAL WITH BIALTERAL SALPINGECTOMY (N/A): stable, progressing well, tolerating diet and Recovered well  Plan: Discharge home. Fup in office in 2 weeks, Post op care reviewed.   LOS: 4 days    Janiylah Hannis R 06/10/2013, 9:55 AM

## 2013-06-10 NOTE — Progress Notes (Signed)
Discharge instructions reviewed with patient.  Patient states understanding of home care.  No home equipment needed.  Patient discharged via wheelchair in stable condition with staff without incident.

## 2013-06-12 ENCOUNTER — Telehealth: Payer: Self-pay | Admitting: Internal Medicine

## 2013-06-12 NOTE — Telephone Encounter (Signed)
, °

## 2013-06-14 ENCOUNTER — Other Ambulatory Visit (HOSPITAL_BASED_OUTPATIENT_CLINIC_OR_DEPARTMENT_OTHER): Payer: 59

## 2013-06-14 DIAGNOSIS — D500 Iron deficiency anemia secondary to blood loss (chronic): Secondary | ICD-10-CM

## 2013-06-14 LAB — CBC WITH DIFFERENTIAL/PLATELET
BASO%: 0.2 % (ref 0.0–2.0)
BASOS ABS: 0 10*3/uL (ref 0.0–0.1)
EOS%: 1.4 % (ref 0.0–7.0)
Eosinophils Absolute: 0.2 10*3/uL (ref 0.0–0.5)
HEMATOCRIT: 36.9 % (ref 34.8–46.6)
HEMOGLOBIN: 11.5 g/dL — AB (ref 11.6–15.9)
LYMPH%: 6.4 % — ABNORMAL LOW (ref 14.0–49.7)
MCH: 26.7 pg (ref 25.1–34.0)
MCHC: 31.2 g/dL — ABNORMAL LOW (ref 31.5–36.0)
MCV: 85.5 fL (ref 79.5–101.0)
MONO#: 0.9 10*3/uL (ref 0.1–0.9)
MONO%: 5.9 % (ref 0.0–14.0)
NEUT%: 86.1 % — ABNORMAL HIGH (ref 38.4–76.8)
NEUTROS ABS: 12.7 10*3/uL — AB (ref 1.5–6.5)
Platelets: 267 10*3/uL (ref 145–400)
RBC: 4.31 10*6/uL (ref 3.70–5.45)
RDW: 20.2 % — ABNORMAL HIGH (ref 11.2–14.5)
WBC: 14.7 10*3/uL — AB (ref 3.9–10.3)
lymph#: 0.9 10*3/uL (ref 0.9–3.3)

## 2013-06-14 LAB — IRON AND TIBC CHCC
%SAT: 7 % — ABNORMAL LOW (ref 21–57)
Iron: 33 ug/dL — ABNORMAL LOW (ref 41–142)
TIBC: 441 ug/dL (ref 236–444)
UIBC: 409 ug/dL — ABNORMAL HIGH (ref 120–384)

## 2013-06-14 LAB — FERRITIN CHCC: Ferritin: 88 ng/ml (ref 9–269)

## 2013-07-06 ENCOUNTER — Ambulatory Visit: Payer: 59

## 2013-07-06 ENCOUNTER — Other Ambulatory Visit: Payer: 59

## 2013-08-15 ENCOUNTER — Other Ambulatory Visit: Payer: Medicaid Other

## 2014-01-12 ENCOUNTER — Emergency Department (HOSPITAL_COMMUNITY): Payer: No Typology Code available for payment source

## 2014-01-12 ENCOUNTER — Encounter (HOSPITAL_COMMUNITY): Payer: Self-pay | Admitting: Emergency Medicine

## 2014-01-12 ENCOUNTER — Emergency Department (HOSPITAL_COMMUNITY)
Admission: EM | Admit: 2014-01-12 | Discharge: 2014-01-12 | Disposition: A | Discharge status: Home / Self Care | Payer: No Typology Code available for payment source | Attending: Emergency Medicine | Admitting: Emergency Medicine

## 2014-01-12 DIAGNOSIS — S3981XA Other specified injuries of abdomen, initial encounter: Secondary | ICD-10-CM | POA: Insufficient documentation

## 2014-01-12 DIAGNOSIS — Y92410 Unspecified street and highway as the place of occurrence of the external cause: Secondary | ICD-10-CM | POA: Diagnosis not present

## 2014-01-12 DIAGNOSIS — D649 Anemia, unspecified: Secondary | ICD-10-CM | POA: Insufficient documentation

## 2014-01-12 DIAGNOSIS — Z86718 Personal history of other venous thrombosis and embolism: Secondary | ICD-10-CM | POA: Diagnosis not present

## 2014-01-12 DIAGNOSIS — Z880 Allergy status to penicillin: Secondary | ICD-10-CM | POA: Insufficient documentation

## 2014-01-12 DIAGNOSIS — Y9389 Activity, other specified: Secondary | ICD-10-CM | POA: Diagnosis not present

## 2014-01-12 DIAGNOSIS — S299XXA Unspecified injury of thorax, initial encounter: Secondary | ICD-10-CM | POA: Diagnosis not present

## 2014-01-12 DIAGNOSIS — Z87891 Personal history of nicotine dependence: Secondary | ICD-10-CM | POA: Diagnosis not present

## 2014-01-12 DIAGNOSIS — Z86711 Personal history of pulmonary embolism: Secondary | ICD-10-CM | POA: Insufficient documentation

## 2014-01-12 DIAGNOSIS — R51 Headache: Secondary | ICD-10-CM | POA: Insufficient documentation

## 2014-01-12 DIAGNOSIS — S3992XA Unspecified injury of lower back, initial encounter: Secondary | ICD-10-CM | POA: Diagnosis not present

## 2014-01-12 DIAGNOSIS — R0902 Hypoxemia: Secondary | ICD-10-CM

## 2014-01-12 LAB — BASIC METABOLIC PANEL
Anion gap: 14 (ref 5–15)
BUN: 9 mg/dL (ref 6–23)
CALCIUM: 9.4 mg/dL (ref 8.4–10.5)
CHLORIDE: 104 meq/L (ref 96–112)
CO2: 25 meq/L (ref 19–32)
CREATININE: 0.59 mg/dL (ref 0.50–1.10)
GFR calc Af Amer: >90 mL/min (ref >90)
GFR calc non Af Amer: >90 mL/min (ref >90)
Glucose, Bld: 173 mg/dL — ABNORMAL HIGH (ref 70–99)
Potassium: 4.2 mEq/L (ref 3.7–5.3)
Sodium: 143 mEq/L (ref 137–147)

## 2014-01-12 LAB — CBC
HEMATOCRIT: 42.5 % (ref 36.0–46.0)
Hemoglobin: 14.7 g/dL (ref 12.0–15.0)
MCH: 31 pg (ref 26.0–34.0)
MCHC: 34.6 g/dL (ref 30.0–36.0)
MCV: 89.7 fL (ref 78.0–100.0)
Platelets: 172 10*3/uL (ref 150–400)
RBC: 4.74 MIL/uL (ref 3.87–5.11)
RDW: 13.2 % (ref 11.5–15.5)
WBC: 7.5 10*3/uL (ref 4.0–10.5)

## 2014-01-12 MED ORDER — CYCLOBENZAPRINE HCL 10 MG PO TABS: 10.0000 mg | ORAL_TABLET | Freq: Two times a day (BID) | ORAL | Status: DC | PRN

## 2014-01-12 MED ORDER — SODIUM CHLORIDE 0.9 % IV BOLUS (SEPSIS)
1000.0000 mL | Freq: Once | INTRAVENOUS | Status: AC
  Administered 2014-01-12: 1000 mL via INTRAVENOUS

## 2014-01-12 MED ORDER — IOHEXOL 300 MG/ML  SOLN
100.0000 mL | Freq: Once | INTRAMUSCULAR | Status: AC | PRN
  Administered 2014-01-12: 100 mL via INTRAVENOUS

## 2014-01-12 MED ORDER — HYDROCODONE-ACETAMINOPHEN 5-325 MG PO TABS: 1.0000 | ORAL_TABLET | Freq: Four times a day (QID) | ORAL | Status: DC | PRN

## 2014-01-12 MED ORDER — ACETAMINOPHEN 500 MG PO TABS
1000.0000 mg | ORAL_TABLET | Freq: Once | ORAL | Status: AC
  Administered 2014-01-12: 1000 mg via ORAL
  Filled 2014-01-12: qty 2

## 2014-01-12 MED ORDER — MORPHINE SULFATE 4 MG/ML IJ SOLN: 4.0000 mg | Freq: Once | INTRAMUSCULAR | Status: DC
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
## 2014-01-12 NOTE — ED Notes (Signed)
Pt reports decrease in pain 4/10 headache and chest pain. Pt denies lower back pain at present time.

## 2014-01-12 NOTE — ED Notes (Signed)
Mingo Amber MD at bedside. On continued assessment pt report lower back pain.

## 2014-01-12 NOTE — Discharge Instructions (Signed)

## 2014-01-12 NOTE — ED Notes (Signed)
Pt reports neck pain 2/10 but denies other pain at discharge.

## 2014-01-29 ENCOUNTER — Encounter (HOSPITAL_COMMUNITY): Payer: Self-pay | Admitting: Emergency Medicine

## 2014-03-28 ENCOUNTER — Other Ambulatory Visit: Payer: Self-pay | Admitting: Internal Medicine

## 2014-03-28 ENCOUNTER — Ambulatory Visit
Admission: RE | Admit: 2014-03-28 | Discharge: 2014-03-28 | Disposition: A | Discharge status: Home / Self Care | Payer: 59 | Source: Ambulatory Visit | Attending: Internal Medicine | Admitting: Internal Medicine

## 2014-03-28 DIAGNOSIS — M25562 Pain in left knee: Secondary | ICD-10-CM

## 2014-03-29 ENCOUNTER — Other Ambulatory Visit: Payer: Self-pay | Admitting: Internal Medicine

## 2014-03-29 DIAGNOSIS — M25562 Pain in left knee: Secondary | ICD-10-CM

## 2014-04-06 ENCOUNTER — Ambulatory Visit
Admission: RE | Admit: 2014-04-06 | Discharge: 2014-04-06 | Disposition: A | Discharge status: Home / Self Care | Payer: 59 | Source: Ambulatory Visit | Attending: Internal Medicine | Admitting: Internal Medicine

## 2014-04-06 DIAGNOSIS — M25562 Pain in left knee: Secondary | ICD-10-CM

## 2014-07-18 ENCOUNTER — Other Ambulatory Visit (HOSPITAL_COMMUNITY): Payer: Self-pay | Admitting: Radiology

## 2014-07-18 DIAGNOSIS — R05 Cough: Secondary | ICD-10-CM

## 2014-07-18 DIAGNOSIS — R059 Cough, unspecified: Secondary | ICD-10-CM

## 2014-07-23 ENCOUNTER — Encounter (HOSPITAL_COMMUNITY): Payer: 59

## 2014-07-30 ENCOUNTER — Encounter (HOSPITAL_COMMUNITY): Payer: 59

## 2014-08-07 ENCOUNTER — Ambulatory Visit (HOSPITAL_COMMUNITY)
Admission: RE | Admit: 2014-08-07 | Discharge: 2014-08-07 | Disposition: A | Discharge status: Home / Self Care | Payer: 59 | Source: Ambulatory Visit | Attending: Internal Medicine | Admitting: Internal Medicine

## 2014-08-07 DIAGNOSIS — R05 Cough: Secondary | ICD-10-CM | POA: Insufficient documentation

## 2014-08-07 LAB — PULMONARY FUNCTION TEST
DL/VA % pred: 106 %
DL/VA: 5.25 ml/min/mmHg/L
DLCO unc % pred: 62 %
DLCO unc: 16.03 ml/min/mmHg
FEF 25-75 Pre: 2.3 L/sec
FEF2575-%Pred-Pre: 89 %
FEV1-%Pred-Pre: 78 %
FEV1-PRE: 1.93 L
FEV1FVC-%Pred-Pre: 101 %
FEV6-%PRED-PRE: 78 %
FEV6-Pre: 2.32 L
FEV6FVC-%PRED-PRE: 103 %
FVC-%PRED-PRE: 76 %
FVC-PRE: 2.32 L
PRE FEV6/FVC RATIO: 100 %
Pre FEV1/FVC ratio: 83 %

## 2015-01-02 ENCOUNTER — Institutional Professional Consult (permissible substitution): Payer: 59 | Admitting: Pulmonary Disease

## 2015-01-14 ENCOUNTER — Encounter: Payer: Self-pay | Admitting: *Deleted

## 2015-01-15 ENCOUNTER — Encounter: Payer: Self-pay | Admitting: Pulmonary Disease

## 2015-01-15 ENCOUNTER — Ambulatory Visit (INDEPENDENT_AMBULATORY_CARE_PROVIDER_SITE_OTHER): Payer: 59 | Admitting: Pulmonary Disease

## 2015-01-15 ENCOUNTER — Ambulatory Visit (INDEPENDENT_AMBULATORY_CARE_PROVIDER_SITE_OTHER)
Admission: RE | Admit: 2015-01-15 | Discharge: 2015-01-15 | Disposition: A | Discharge status: Home / Self Care | Payer: 59 | Source: Ambulatory Visit | Attending: Pulmonary Disease | Admitting: Pulmonary Disease

## 2015-01-15 VITALS — BP 144/86 | HR 88 | Temp 98.6°F | Ht 65.0 in | Wt 246.4 lb

## 2015-01-15 DIAGNOSIS — R0600 Dyspnea, unspecified: Secondary | ICD-10-CM | POA: Diagnosis not present

## 2015-01-15 MED ORDER — FLUTICASONE PROPIONATE 50 MCG/ACT NA SUSP: 2.0000 | Freq: Every day | NASAL | Status: DC

## 2015-01-15 NOTE — Patient Instructions (Signed)
We will get a chest xray Schedule you for an echocardiogram. We will order flonase nasal spray. Continue to use the albuterol inhaler as needed.  Return to clinic in 3 months.

## 2015-01-15 NOTE — Progress Notes (Signed)
Subjective:    Patient ID: [name], female    DOB: June 26, 1965, 49 y.o.   MRN: [id number]  HPI Evaluation for dyspnea on exertion, cough.  [name] is a 49 year old with past medical history of DVT, PE in 2014, obstructive sleep apnea., Obesity, and deficiency anemia. She complains of dyspnea on exertion. His symptoms started after she was diagnosed with a DVT and PE in 2014. This was apparently unprovoked and hypercoaguable panel was negative she was given anticoagulation for about 6 months.  She also complains of chronic cough for about 6 months. Productive of whitish, greenish sputum. She denies any fevers or chills. She has chronic rhinitis and postnasal drip. She denies any history or symptoms of GERD. She was started on albuterol inhaler when necessary. She says that this does help with her dyspnea and her cough.  She was diagnosed with OSA in 2015. She is on CPAP for a few months but then stopped because it was causing nasal congestion.  Past Medical History  Diagnosis Date  . Medical history non-contributory   . Anemia     iron deficiency  . DVT (deep venous thrombosis) ([place]) 2014    left leg  . PE (pulmonary embolism) 2014    bilateral  . S/P TAH (total abdominal hysterectomy) 06/07/2013  . Pulmonary nodule     (12m on loeft lower lobe) found on CT scan July 2014, repeat scan Jan 2015 showed less than 458m . Menorrhagia     secondary to uterine fibroids  . Diabetes mellitus, type II ([place]  . OSA (obstructive sleep apnea)     07/25/13 HST AHI 33/hr, severe hypoxemia O2 min 42% and 95% of the time <89%  . HTN (hypertension)     Medications: MVI Iron Metformin Furosemide Losartan Proventil when necessary Atorvastatin.  Social history: She has about 10-pack-year history of smoking. She quit in 2012. Occasional alcohol use. No illegal drug use. She works as a meEmergency planning/management officer PFTs (08/07/14) FVC 2.32 (76%) FEV1 1.93 (78%) F/F 83 Slow vital capacity  2.32 (78%) DLCO 62  .Review of Systems Complains of dyspnea on exertion and at rest. Nonproductive cough. Denies any hemoptysis, chest pain, palpitations. Denies any fevers, chills. Denies any nausea, vomiting, diarrhea, constipation. All other review of systems are negative    Objective:   Physical Exam  Blood pressure 144/86, pulse 88, temperature 98.6 F (37 C), temperature source Oral, height 5' 5" (1.651 m), weight 246 lb 6.4 oz (111.766 kg), last menstrual period 05/11/2013, SpO2 93 %.  Gen.: Pleasant lady. No apparent distress, obese Neuro: No gross focal deficits. Neck: No JVD, lymphadenopathy, thyromegaly. RS: Clear anteriorly CVS: S1-S2 heard, no murmurs rubs gallops. Abdomen: Soft, positive bowel sounds. Extremities: No edema.    Assessment & Plan:   #1 Dyspnea on exertion. Her PFTs from earlier this year is suggestive of mild restriction as shown by reduction in FVC, FEV1 and slow vital capacity. The restriction may be secondary to obesity and body habitus. There is no evidence in past imaging of interstitial lung disease. I will repeat chest x-ray today. There is a moderate reduction in DLCO which corrects for alveolar capacity. She has a history of prior PEs and DVT. I will also order echocardiogram to evaluate for pulmonary hypertension.  #2 Chronic cough. This may be secondary to her rhinitis and postnasal drip. She does have some improvement with albuterol. I will add a Flonase nasal spray. This no evidence of an upper respiratory tract  infection and no indication for antibiotics.  #3 OSA She has significant OSA with significant desaturations to 42% in a sleep study done in 2015. Her untreated OSA might also be contributing to her dyspnea and cough. I'll try to get the results of her prior study and the CPAP settings she was on to reinitiate therapy.  Plan: - Chest X ray - Echocardiogram - Flonase nasal spray  Marshell Garfinkel MD [facility] Pager [phone] If no answer or after 3pm call: [phone] 01/15/2015, 5:14 PM

## 2015-01-23 ENCOUNTER — Other Ambulatory Visit (HOSPITAL_COMMUNITY): Payer: 59

## 2015-02-11 ENCOUNTER — Ambulatory Visit: Payer: Self-pay | Admitting: Physician Assistant

## 2015-02-11 ENCOUNTER — Encounter: Payer: Self-pay | Admitting: Physician Assistant

## 2015-02-11 VITALS — BP 147/84 | HR 82 | Temp 98.8°F

## 2015-02-11 DIAGNOSIS — H6983 Other specified disorders of Eustachian tube, bilateral: Secondary | ICD-10-CM

## 2015-02-11 MED ORDER — PSEUDOEPH-BROMPHEN-DM 30-2-10 MG/5ML PO SYRP: 5.0000 mL | ORAL_SOLUTION | Freq: Four times a day (QID) | ORAL | Status: DC | PRN

## 2015-02-11 NOTE — Progress Notes (Signed)
[facility] Emergency Department Provider Note  ____________________________________________  Time seen: Approximately 3:50 PM  I have reviewed the triage vital signs and the nursing notes.   HISTORY  Chief Complaint Ear Pain and Dizziness    HPI [name] is a 49 y.o. female Patient c/o 4 days of bilateral ear pressure, decreased hearing, and vertigo.  Denies fever or N/V/D.  Past Medical History  Diagnosis Date  . Medical history non-contributory   . Anemia     iron deficiency  . DVT (deep venous thrombosis) ([place]) 2014    left leg  . PE (pulmonary embolism) 2014    bilateral  . S/P TAH (total abdominal hysterectomy) 06/07/2013  . Pulmonary nodule     (37m on loeft lower lobe) found on CT scan July 2014, repeat scan Jan 2015 showed less than 483m . Menorrhagia     secondary to uterine fibroids  . Diabetes mellitus, type II ([place]  . OSA (obstructive sleep apnea)     07/25/13 HST AHI 33/hr, severe hypoxemia O2 min 42% and 95% of the time <89%  . HTN (hypertension)     Patient Active Problem List   Diagnosis Date Noted  . S/P TAH (total abdominal hysterectomy) 06/07/2013  . Acute blood loss anemia 05/14/2013  . Abnormal uterine bleeding 10/13/2012  . Fibroids 10/13/2012  . Anemia 10/13/2012  . VTE (venous thromboembolism) 10/04/2012  . Pulmonary nodule 10/04/2012  . Hypokalemia 10/04/2012    Past Surgical History  Procedure Laterality Date  . Polypectomy  2008    Removal of uterine polyp  . Cesarean section    . Abdominal hysterectomy N/A 06/07/2013    Procedure: HYSTERECTOMY ABDOMINAL WITH BIALTERAL SALPINGECTOMY;  Surgeon: VaElveria RoyalsMD;  [facility];  Service: Gynecology;  Laterality: N/A;    Current Outpatient Rx  Name  Route  Sig  Dispense  Refill  . acetaminophen (TYLENOL) 500 MG tablet   Oral   Take 500 mg by mouth every 6 (six) hours as needed for moderate pain or headache.         . fluticasone (FLONASE) 50  MCG/ACT nasal spray   Each Nare   Place 2 sprays into both nostrils daily.   16 g   5   . furosemide (LASIX) 20 MG tablet   Oral   Take 20 mg by mouth daily.         . potassium chloride (K-DUR) 10 MEQ tablet   Oral   Take 10 mEq by mouth daily.         . cyclobenzaprine (FLEXERIL) 10 MG tablet   Oral   Take 1 tablet (10 mg total) by mouth 2 (two) times daily as needed for muscle spasms. Patient not taking: Reported on 02/11/2015   20 tablet   0   . ferrous sulfate 325 (65 FE) MG tablet   Oral   Take 325 mg by mouth 2 (two) times daily.          . Marland KitchenYDROcodone-acetaminophen (NORCO/VICODIN) 5-325 MG per tablet   Oral   Take 1 tablet by mouth every 6 (six) hours as needed for moderate pain. Patient not taking: Reported on 02/11/2015   20 tablet   0   . Multiple Vitamin (MULTIVITAMIN WITH MINERALS) TABS tablet   Oral   Take 1 tablet by mouth daily.           Allergies Ampicillin; Amoxicillin; and Sulfa antibiotics  Family History  Problem Relation Age  of Onset  . Stroke Sister   . Hypertension Mother   . Kidney disease Mother   . Hypertension Father     Social History Social History  Substance Use Topics  . Smoking status: Former Smoker -- 0.33 packs/day for 10 years    Types: Cigarettes    Quit date: 10/04/2011  . Smokeless tobacco: Never Used  . Alcohol Use: 0.0 oz/week    0 Standard drinks or equivalent per week     Comment: social    Review of Systems Constitutional: No fever/chills Eyes: No visual changes. ENT: No sore throat.bilateral ear pressure. Decrease hearing. Cardiovascular: Denies chest pain. Respiratory: Denies shortness of breath. Gastrointestinal: No abdominal pain.  No nausea, no vomiting.  No diarrhea.  No constipation. Genitourinary: Negative for dysuria. Musculoskeletal: Negative for back pain. Skin: Negative for rash. Neurological: Negative for headaches, focal weakness or numbness. Mild transit  vertigo. Allergic/Immunilogical: amoxil 10-point ROS otherwise negative.  ____________________________________________   PHYSICAL EXAM:  VITAL SIGNS:  Constitutional: Alert and oriented. Well appearing and in no acute distress. Eyes: Conjunctivae are normal. PERRL. EOMI. Head: Atraumatic. Nose: No congestion/rhinnorhea.Bilateral maxillary guarding with bulging TMs Mouth/Throat: Mucous membranes are moist.  Oropharynx non-erythematous. Neck: No stridor.  No cervical spine tenderness to palpation. Hematological/Lymphatic/Immunilogical: No cervical lymphadenopathy. Cardiovascular: Normal rate, regular rhythm. Grossly normal heart sounds.  Good peripheral circulation. Respiratory: Normal respiratory effort.  No retractions. Lungs CTAB. Gastrointestinal: Soft and nontender. No distention. No abdominal bruits. No CVA tenderness. Musculoskeletal: No lower extremity tenderness nor edema.  No joint effusions. Neurologic:  Normal speech and language. No gross focal neurologic deficits are appreciated. No gait instability. Skin:  Skin is warm, dry and intact. No rash noted. Psychiatric: Mood and affect are normal. Speech and behavior are normal.  ____________________________________________   LABS (all labs ordered are listed, but only abnormal results are displayed)   ____________________________________________  EKG   ____________________________________________  RADIOLOGY   ____________________________________________   PROCEDURES  Procedure(s) performed: None  Critical Care performed: No  ____________________________________________   INITIAL IMPRESSION / ASSESSMENT AND PLAN / ED COURSE  Pertinent labs & imaging results that were available during my care of the patient were reviewed by me and considered in my medical decision making (see chart for details).  Eustachian tube dysfunction and URI ____________________________________________   FINAL CLINICAL  IMPRESSION(S) / ED DIAGNOSES Eustachian dysfunction and URI

## 2015-03-29 ENCOUNTER — Encounter: Payer: Self-pay | Admitting: Physician Assistant

## 2015-03-29 ENCOUNTER — Ambulatory Visit: Payer: Self-pay | Admitting: Physician Assistant

## 2015-03-29 VITALS — BP 180/120 | HR 84 | Temp 97.5°F

## 2015-03-29 DIAGNOSIS — R05 Cough: Secondary | ICD-10-CM

## 2015-03-29 DIAGNOSIS — J329 Chronic sinusitis, unspecified: Secondary | ICD-10-CM

## 2015-03-29 DIAGNOSIS — I10 Essential (primary) hypertension: Secondary | ICD-10-CM

## 2015-03-29 DIAGNOSIS — R059 Cough, unspecified: Secondary | ICD-10-CM

## 2015-03-29 MED ORDER — LOSARTAN POTASSIUM 100 MG PO TABS: 100.0000 mg | ORAL_TABLET | Freq: Every day | ORAL | Status: DC

## 2015-03-29 MED ORDER — BENZONATATE 200 MG PO CAPS: 200.0000 mg | ORAL_CAPSULE | Freq: Two times a day (BID) | ORAL | Status: DC | PRN

## 2015-03-29 MED ORDER — AZITHROMYCIN 250 MG PO TABS: ORAL_TABLET | ORAL | Status: DC

## 2015-03-29 NOTE — Progress Notes (Signed)
S/ in today for c/c cough  , stopped up ears , increased thick pnd and dizziness  Which she says is the  same as when here a few weeks ago . The cough syrup helped her sxs but she had not taken it in a few days.   Noting her BP to be higher than on file and by her hx , denies cardio / neuro sxs  Admits to high sodium,fat diet and wt gain , not taken her lasix x one week she takes for prn edema  O/ alert pleasant nad ENT tms retracted , nasal turbinates are boggy and nonpatent, inflamed , throat increased PND , neck supple Heart rsr lungs are clear  No obvious  lateralising signs   A/ multiple chronic problems , new pt to me  HTN  Without sxs  Morbid obesity  rhinosinusitis  Cough   P/ rx cozaar 100 mg one daily. Monitor bps bid and prn with her home large cuff. Rest at home prescription for zpack and tessalon pearles for sxs . If she has worsening of her bp and or sxs of cp,sob HA or weakness she should seek urgent care. Her PCP is out of town and I urged her to get appt next week to follow up on bp . She states she will do so.

## 2015-04-14 ENCOUNTER — Encounter (HOSPITAL_COMMUNITY): Payer: Self-pay | Admitting: *Deleted

## 2015-04-14 ENCOUNTER — Emergency Department (HOSPITAL_COMMUNITY)
Admission: EM | Admit: 2015-04-14 | Discharge: 2015-04-14 | Disposition: A | Discharge status: Home / Self Care | Payer: BLUE CROSS/BLUE SHIELD | Attending: Emergency Medicine | Admitting: Emergency Medicine

## 2015-04-14 DIAGNOSIS — Z862 Personal history of diseases of the blood and blood-forming organs and certain disorders involving the immune mechanism: Secondary | ICD-10-CM | POA: Insufficient documentation

## 2015-04-14 DIAGNOSIS — Z86711 Personal history of pulmonary embolism: Secondary | ICD-10-CM | POA: Diagnosis not present

## 2015-04-14 DIAGNOSIS — Z90710 Acquired absence of both cervix and uterus: Secondary | ICD-10-CM | POA: Diagnosis not present

## 2015-04-14 DIAGNOSIS — E119 Type 2 diabetes mellitus without complications: Secondary | ICD-10-CM | POA: Insufficient documentation

## 2015-04-14 DIAGNOSIS — Z7951 Long term (current) use of inhaled steroids: Secondary | ICD-10-CM | POA: Diagnosis not present

## 2015-04-14 DIAGNOSIS — Z8742 Personal history of other diseases of the female genital tract: Secondary | ICD-10-CM | POA: Diagnosis not present

## 2015-04-14 DIAGNOSIS — Z880 Allergy status to penicillin: Secondary | ICD-10-CM | POA: Insufficient documentation

## 2015-04-14 DIAGNOSIS — I10 Essential (primary) hypertension: Secondary | ICD-10-CM | POA: Insufficient documentation

## 2015-04-14 DIAGNOSIS — Z9981 Dependence on supplemental oxygen: Secondary | ICD-10-CM | POA: Insufficient documentation

## 2015-04-14 DIAGNOSIS — Z86718 Personal history of other venous thrombosis and embolism: Secondary | ICD-10-CM | POA: Diagnosis not present

## 2015-04-14 DIAGNOSIS — Z79899 Other long term (current) drug therapy: Secondary | ICD-10-CM | POA: Diagnosis not present

## 2015-04-14 DIAGNOSIS — Z87891 Personal history of nicotine dependence: Secondary | ICD-10-CM | POA: Insufficient documentation

## 2015-04-14 DIAGNOSIS — G4733 Obstructive sleep apnea (adult) (pediatric): Secondary | ICD-10-CM | POA: Diagnosis not present

## 2015-04-14 LAB — CBC WITH DIFFERENTIAL/PLATELET
BASOS ABS: 0 10*3/uL (ref 0.0–0.1)
BASOS PCT: 0 %
Eosinophils Absolute: 0.1 10*3/uL (ref 0.0–0.7)
Eosinophils Relative: 2 %
HEMATOCRIT: 52.2 % — AB (ref 36.0–46.0)
HEMOGLOBIN: 17.3 g/dL — AB (ref 12.0–15.0)
LYMPHS PCT: 25 %
Lymphs Abs: 2 10*3/uL (ref 0.7–4.0)
MCH: 32.7 pg (ref 26.0–34.0)
MCHC: 33.1 g/dL (ref 30.0–36.0)
MCV: 98.7 fL (ref 78.0–100.0)
MONO ABS: 0.6 10*3/uL (ref 0.1–1.0)
Monocytes Relative: 8 %
NEUTROS ABS: 5.2 10*3/uL (ref 1.7–7.7)
NEUTROS PCT: 65 %
Platelets: 199 10*3/uL (ref 150–400)
RBC: 5.29 MIL/uL — AB (ref 3.87–5.11)
RDW: 13.5 % (ref 11.5–15.5)
WBC: 8 10*3/uL (ref 4.0–10.5)

## 2015-04-14 LAB — URINE MICROSCOPIC-ADD ON

## 2015-04-14 LAB — URINALYSIS, ROUTINE W REFLEX MICROSCOPIC
Bilirubin Urine: NEGATIVE
GLUCOSE, UA: NEGATIVE mg/dL
Hgb urine dipstick: NEGATIVE
KETONES UR: NEGATIVE mg/dL
LEUKOCYTES UA: NEGATIVE
NITRITE: NEGATIVE
PH: 6.5 (ref 5.0–8.0)
Protein, ur: >300 mg/dL — AB
SPECIFIC GRAVITY, URINE: 1.025 (ref 1.005–1.030)

## 2015-04-14 NOTE — ED Notes (Signed)
Per nurse Luz Lex cancel recollect on pt

## 2015-04-14 NOTE — ED Provider Notes (Signed)
CSN: [id number]     Arrival date & time 04/14/15  0020 History  By signing my name below, I, Evelene Croon, attest that this documentation has been prepared under the direction and in the presence of Leo Grosser, MD . Electronically Signed: Evelene Croon, Scribe. 04/14/2015. 2:43 AM.    Chief Complaint  Patient presents with  . Hypertension    The history is provided by the patient. No language interpreter was used.     HPI Comments:  [name] is a 50 y.o. female with a history of HTN, who presents to the Emergency Department complaining of elevated BP x  3 days. She reports systolic BP in 144Q. She is currently on losartan for BP which she started taking 3 days ago; notes it has not improved her BP. Pt has no physical complaints or symptoms at this time; no CP, HA, or vision change. No alleviating factors noted.   Past Medical History  Diagnosis Date  . Medical history non-contributory   . Anemia     iron deficiency  . DVT (deep venous thrombosis) ([place]) 2014    left leg  . PE (pulmonary embolism) 2014    bilateral  . S/P TAH (total abdominal hysterectomy) 06/07/2013  . Pulmonary nodule     (68m on loeft lower lobe) found on CT scan July 2014, repeat scan Jan 2015 showed less than 412m . Menorrhagia     secondary to uterine fibroids  . OSA (obstructive sleep apnea)     07/25/13 HST AHI 33/hr, severe hypoxemia O2 min 42% and 95% of the time <89%  . HTN (hypertension)   . [facility]   Past Surgical History  Procedure Laterality Date  . Polypectomy  2008    Removal of uterine polyp  . Cesarean section    . Abdominal hysterectomy N/A 06/07/2013    Procedure: HYSTERECTOMY ABDOMINAL WITH BIALTERAL SALPINGECTOMY;  Surgeon: VaElveria RoyalsMD;  [facility];  Service: Gynecology;  Laterality: N/A;   Family History  Problem Relation Age of Onset  . Stroke Sister   . Hypertension Mother   . Kidney disease Mother   . Hypertension Father    Social  History  Substance Use Topics  . Smoking status: Former Smoker -- 0.33 packs/day for 10 years    Types: Cigarettes    Quit date: 10/04/2011  . Smokeless tobacco: Never Used  . Alcohol Use: 0.0 oz/week    0 Standard drinks or equivalent per week     Comment: social   OB History    Gravida Para Term Preterm AB TAB SAB Ectopic Multiple Living   _0 0 _1 0 0 1     Review of Systems  Constitutional: Negative for fever and chills.       + Hypertension  Eyes: Negative for visual disturbance.  Cardiovascular: Negative for chest pain.  Neurological: Negative for headaches.  All other systems reviewed and are negative.   Allergies  Ampicillin; Amoxicillin; and Sulfa antibiotics  Home Medications   Prior to Admission medications   Medication Sig Start Date End Date Taking? Authorizing Provider  acetaminophen (TYLENOL) 500 MG tablet Take 500 mg by mouth every 6 (six) hours as needed for moderate pain or headache.   Yes Historical Provider, MD  fluticasone (FLONASE) 50 MCG/ACT nasal spray Place 2 sprays into both nostrils daily. 01/15/15  Yes Praveen Mannam, MD  furosemide (LASIX) 20 MG tablet Take 20 mg by mouth  daily.   Yes Historical Provider, MD  losartan (COZAAR) 100 MG tablet Take 1 tablet (100 mg total) by mouth daily. 03/29/15  Yes Tommie Homero Fellers, FNP  potassium chloride (K-DUR) 10 MEQ tablet Take 10 mEq by mouth daily.   Yes Historical Provider, MD  sodium chloride (OCEAN) 0.65 % SOLN nasal spray Place 1 spray into both nostrils as needed for congestion.   Yes Historical Provider, MD  azithromycin (ZITHROMAX Z-PAK) 250 MG tablet As directed Patient not taking: Reported on 04/14/2015 03/29/15   Tommie Homero Fellers, FNP  benzonatate (TESSALON) 200 MG capsule Take 1 capsule (200 mg total) by mouth 2 (two) times daily as needed for cough. Patient not taking: Reported on 04/14/2015 03/29/15   Blima Singer, FNP  brompheniramine-pseudoephedrine-DM 30-2-10 MG/5ML syrup Take 5  mLs by mouth 4 (four) times daily as needed. Patient not taking: Reported on 04/14/2015 02/11/15   Sable Feil, PA-C  cyclobenzaprine (FLEXERIL) 10 MG tablet Take 1 tablet (10 mg total) by mouth 2 (two) times daily as needed for muscle spasms. Patient not taking: Reported on 04/14/2015 01/12/14   Evelina Bucy, MD  HYDROcodone-acetaminophen (NORCO/VICODIN) 5-325 MG per tablet Take 1 tablet by mouth every 6 (six) hours as needed for moderate pain. Patient not taking: Reported on 02/11/2015 01/12/14   Evelina Bucy, MD   BP 167/108 mmHg  Pulse 84  Temp(Src) 97.9 F (36.6 C) (Oral)  Resp 19  SpO2 93%  LMP 05/11/2013 Physical Exam  Constitutional: She is oriented to person, place, and time. She appears well-developed and well-nourished. No distress.  HENT:  Head: Normocephalic.  Eyes: Conjunctivae are normal.  Neck: Neck supple. No tracheal deviation present.  Cardiovascular: Normal rate, regular rhythm and normal heart sounds.  Exam reveals no gallop and no friction rub.   No murmur heard. Pulmonary/Chest: Effort normal and breath sounds normal. No respiratory distress. She has no wheezes.  Abdominal: Soft. She exhibits no distension.  Musculoskeletal: She exhibits no edema.  Neurological: She is alert and oriented to person, place, and time.  Skin: Skin is warm and dry.  Psychiatric: She has a normal mood and affect.  Nursing note and vitals reviewed.   ED Course  Procedures   DIAGNOSTIC STUDIES:  Oxygen Saturation is 93% on RA, low by my interpretation.    COORDINATION OF CARE:  2:43 AM Discussed treatment plan with pt at bedside and pt agreed to plan.  Labs Review Labs Reviewed  CBC WITH DIFFERENTIAL/PLATELET - Abnormal; Notable for the following:    RBC 5.29 (*)    Hemoglobin 17.3 (*)    HCT 52.2 (*)    All other components within normal limits  URINALYSIS, ROUTINE W REFLEX MICROSCOPIC (NOT AT [facility])  COMPREHENSIVE METABOLIC PANEL  TROPONIN I    Imaging Review No  results found. I have personally reviewed and evaluated these lab results as part of my medical decision-making.   EKG Interpretation   Date/Time:  Sunday April 14 2015 02:27:08 EST Ventricular Rate:  82 PR Interval:  163 QRS Duration: 90 QT Interval:  368 QTC Calculation: 430 R Axis:   102 Text Interpretation:  Sinus rhythm Biatrial enlargement Right axis  deviation Nonspecific T abnormalities, anterior leads No significant  change since last tracing Confirmed by Briley Sulton MD, Gautam Langhorst ([id number]) on  04/14/2015 2:52:31 AM      MDM   Final diagnoses:  Chronic hypertension    50 y.o. female presents with complaint that she has noticed her BP was high. Had  been started on losartan by PCP but has not noticed a change in BP. Diastolic is high here but no chest pain, confusion, new ShOB, or other s/s of hypertensive urgency/emergency. Triage labs clotted and available results are unreliable. I explained to Pt that in absence of symptoms there is no acute management or testing necessary and she can f/u with PCP for changes in medications as needed.   I personally performed the services described in this documentation, which was scribed in my presence. The recorded information has been reviewed and is accurate.     Leo Grosser, MD 04/15/15 [id number]

## 2015-04-14 NOTE — ED Notes (Signed)
Pt states her bp has been elevated for several days, started bp med on Thursday. Took bp tonight and it was 180/120. Pt states she has headaches in the mornings when she wakes up, but improves after taking her meds.

## 2015-04-14 NOTE — Discharge Instructions (Signed)
Hypertension Hypertension, commonly called high blood pressure, is when the force of blood pumping through your arteries is too strong. Your arteries are the blood vessels that carry blood from your heart throughout your body. A blood pressure reading consists of a higher number over a lower number, such as 110/72. The higher number (systolic) is the pressure inside your arteries when your heart pumps. The lower number (diastolic) is the pressure inside your arteries when your heart relaxes. Ideally you want your blood pressure below 120/80. Hypertension forces your heart to work harder to pump blood. Your arteries may become narrow or stiff. Having untreated or uncontrolled hypertension can cause heart attack, stroke, kidney disease, and other problems. RISK FACTORS Some risk factors for high blood pressure are controllable. Others are not.  Risk factors you cannot control include:   Race. You may be at higher risk if you are African American.  Age. Risk increases with age.  Gender. Men are at higher risk than women before age 45 years. After age 65, women are at higher risk than men. Risk factors you can control include:  Not getting enough exercise or physical activity.  Being overweight.  Getting too much fat, sugar, calories, or salt in your diet.  Drinking too much alcohol. SIGNS AND SYMPTOMS Hypertension does not usually cause signs or symptoms. Extremely high blood pressure (hypertensive crisis) may cause headache, anxiety, shortness of breath, and nosebleed. DIAGNOSIS To check if you have hypertension, your health care provider will measure your blood pressure while you are seated, with your arm held at the level of your heart. It should be measured at least twice using the same arm. Certain conditions can cause a difference in blood pressure between your right and left arms. A blood pressure reading that is higher than normal on one occasion does not mean that you need treatment. If  it is not clear whether you have high blood pressure, you may be asked to return on a different day to have your blood pressure checked again. Or, you may be asked to monitor your blood pressure at home for 1 or more weeks. TREATMENT Treating high blood pressure includes making lifestyle changes and possibly taking medicine. Living a healthy lifestyle can help lower high blood pressure. You may need to change some of your habits. Lifestyle changes may include:  Following the DASH diet. This diet is high in fruits, vegetables, and whole grains. It is low in salt, red meat, and added sugars.  Keep your sodium intake below 2,300 mg per day.  Getting at least 30-45 minutes of aerobic exercise at least 4 times per week.  Losing weight if necessary.  Not smoking.  Limiting alcoholic beverages.  Learning ways to reduce stress. Your health care provider may prescribe medicine if lifestyle changes are not enough to get your blood pressure under control, and if one of the following is true:  You are 18-59 years of age and your systolic blood pressure is above 140.  You are 60 years of age or older, and your systolic blood pressure is above 150.  Your diastolic blood pressure is above 90.  You have diabetes, and your systolic blood pressure is over 140 or your diastolic blood pressure is over 90.  You have kidney disease and your blood pressure is above 140/90.  You have heart disease and your blood pressure is above 140/90. Your personal target blood pressure may vary depending on your medical conditions, your age, and other factors. HOME CARE INSTRUCTIONS    Have your blood pressure rechecked as directed by your health care provider.   Take medicines only as directed by your health care provider. Follow the directions carefully. Blood pressure medicines must be taken as prescribed. The medicine does not work as well when you skip doses. Skipping doses also puts you at risk for  problems.  Do not smoke.   Monitor your blood pressure at home as directed by your health care provider. SEEK MEDICAL CARE IF:   You think you are having a reaction to medicines taken.  You have recurrent headaches or feel dizzy.  You have swelling in your ankles.  You have trouble with your vision. SEEK IMMEDIATE MEDICAL CARE IF:  You develop a severe headache or confusion.  You have unusual weakness, numbness, or feel faint.  You have severe chest or abdominal pain.  You vomit repeatedly.  You have trouble breathing. MAKE SURE YOU:   Understand these instructions.  Will watch your condition.  Will get help right away if you are not doing well or get worse.   This information is not intended to replace advice given to you by your health care provider. Make sure you discuss any questions you have with your health care provider.   Document Released: 03/16/2005 Document Revised: 07/31/2014 Document Reviewed: 01/06/2013 Elsevier Interactive Patient Education 2016 Elsevier Inc.  

## 2015-04-17 ENCOUNTER — Ambulatory Visit: Payer: 59 | Admitting: Pulmonary Disease

## 2015-06-06 ENCOUNTER — Ambulatory Visit: Payer: Self-pay | Admitting: Physician Assistant

## 2015-06-06 ENCOUNTER — Encounter: Payer: Self-pay | Admitting: Physician Assistant

## 2015-06-06 VITALS — BP 140/90 | Temp 98.6°F

## 2015-06-06 DIAGNOSIS — J069 Acute upper respiratory infection, unspecified: Secondary | ICD-10-CM

## 2015-06-06 DIAGNOSIS — J329 Chronic sinusitis, unspecified: Secondary | ICD-10-CM

## 2015-06-06 MED ORDER — ALBUTEROL SULFATE HFA 108 (90 BASE) MCG/ACT IN AERS: 2.0000 | INHALATION_SPRAY | Freq: Four times a day (QID) | RESPIRATORY_TRACT | Status: DC | PRN

## 2015-06-06 MED ORDER — AZITHROMYCIN 250 MG PO TABS: ORAL_TABLET | ORAL | Status: DC

## 2015-06-06 MED ORDER — METHYLPREDNISOLONE 4 MG PO TBPK: ORAL_TABLET | ORAL | Status: DC

## 2015-06-06 NOTE — Progress Notes (Signed)
S: C/o cough and congestion with wheezing at night, chest is sore from coughing, denies fever, chills;  cough is dry and hacking; keeping pt awake at night;  denies cardiac type chest pain or sob, v/d, abd pain Remainder ros neg  O: vitals wnl, nad, tms clear, throat injected, neck supple no lymph, lungs c t a, cv rrr, neuro intact, cough is dry and hacking  A:  Acute bronchitis   P:  rx medication:  Zpack, medrol dose pack, ventolin inhaler,  use otc meds, tylenol or motrin as needed for fever/chills, return if not better in 3 -5 days, return earlier if worsening

## 2015-08-14 DIAGNOSIS — R42 Dizziness and giddiness: Secondary | ICD-10-CM | POA: Diagnosis not present

## 2015-08-14 DIAGNOSIS — E1121 Type 2 diabetes mellitus with diabetic nephropathy: Secondary | ICD-10-CM | POA: Diagnosis not present

## 2015-08-30 DIAGNOSIS — I129 Hypertensive chronic kidney disease with stage 1 through stage 4 chronic kidney disease, or unspecified chronic kidney disease: Secondary | ICD-10-CM | POA: Diagnosis not present

## 2015-08-30 DIAGNOSIS — E1122 Type 2 diabetes mellitus with diabetic chronic kidney disease: Secondary | ICD-10-CM | POA: Diagnosis not present

## 2015-08-30 DIAGNOSIS — R809 Proteinuria, unspecified: Secondary | ICD-10-CM | POA: Diagnosis not present

## 2015-08-30 DIAGNOSIS — D631 Anemia in chronic kidney disease: Secondary | ICD-10-CM | POA: Diagnosis not present

## 2015-09-07 ENCOUNTER — Encounter (HOSPITAL_COMMUNITY): Payer: Self-pay | Admitting: Emergency Medicine

## 2015-09-07 ENCOUNTER — Ambulatory Visit (HOSPITAL_COMMUNITY)
Admission: EM | Admit: 2015-09-07 | Discharge: 2015-09-07 | Disposition: A | Discharge status: Home / Self Care | Payer: BLUE CROSS/BLUE SHIELD | Attending: Emergency Medicine | Admitting: Emergency Medicine

## 2015-09-07 ENCOUNTER — Ambulatory Visit (INDEPENDENT_AMBULATORY_CARE_PROVIDER_SITE_OTHER): Payer: BLUE CROSS/BLUE SHIELD

## 2015-09-07 DIAGNOSIS — M10072 Idiopathic gout, left ankle and foot: Secondary | ICD-10-CM | POA: Diagnosis not present

## 2015-09-07 DIAGNOSIS — M109 Gout, unspecified: Secondary | ICD-10-CM

## 2015-09-07 DIAGNOSIS — M79675 Pain in left toe(s): Secondary | ICD-10-CM | POA: Diagnosis not present

## 2015-09-07 DIAGNOSIS — S99922A Unspecified injury of left foot, initial encounter: Secondary | ICD-10-CM | POA: Diagnosis not present

## 2015-09-07 MED ORDER — HYDROCODONE-ACETAMINOPHEN 5-325 MG PO TABS: 1.0000 | ORAL_TABLET | Freq: Four times a day (QID) | ORAL | Status: DC | PRN

## 2015-09-07 MED ORDER — NAPROXEN 500 MG PO TABS: 500.0000 mg | ORAL_TABLET | Freq: Two times a day (BID) | ORAL | Status: DC

## 2015-09-07 MED ORDER — TRAMADOL HCL 50 MG PO TABS: 50.0000 mg | ORAL_TABLET | Freq: Four times a day (QID) | ORAL | Status: DC | PRN

## 2015-09-07 NOTE — ED Provider Notes (Signed)
CSN: [id number]     Arrival date & time 09/07/15  1947 History   First MD Initiated Contact with Patient 09/07/15 2010     Chief Complaint  Patient presents with  . Toe Pain    Patient is a 50 y.o. female presenting with toe pain. The history is provided by the patient.  Toe Pain This is a new problem. The current episode started more than 2 days ago. The problem occurs constantly. The problem has been gradually worsening. The symptoms are aggravated by walking and standing. Nothing relieves the symptoms. She has tried nothing for the symptoms.  Approx 1 week h/o severe (L) great toe pain and swelling. Denies injury. No fever. No open wounds.  Past Medical History  Diagnosis Date  . Medical history non-contributory   . Anemia     iron deficiency  . DVT (deep venous thrombosis) ([place]) 2014    left leg  . PE (pulmonary embolism) 2014    bilateral  . S/P TAH (total abdominal hysterectomy) 06/07/2013  . Pulmonary nodule     (61m on loeft lower lobe) found on CT scan July 2014, repeat scan Jan 2015 showed less than 438m . Menorrhagia     secondary to uterine fibroids  . OSA (obstructive sleep apnea)     07/25/13 HST AHI 33/hr, severe hypoxemia O2 min 42% and 95% of the time <89%  . HTN (hypertension)   . [facility]   Past Surgical History  Procedure Laterality Date  . Polypectomy  2008    Removal of uterine polyp  . Cesarean section    . Abdominal hysterectomy N/A 06/07/2013    Procedure: HYSTERECTOMY ABDOMINAL WITH BIALTERAL SALPINGECTOMY;  Surgeon: VaElveria RoyalsMD;  [facility];  Service: Gynecology;  Laterality: N/A;   Family History  Problem Relation Age of Onset  . Stroke Sister   . Hypertension Mother   . Kidney disease Mother   . Hypertension Father    Social History  Substance Use Topics  . Smoking status: Former Smoker -- 0.33 packs/day for 10 years    Types: Cigarettes    Quit date: 10/04/2011  . Smokeless tobacco: Never Used  .  Alcohol Use: 0.0 oz/week    0 Standard drinks or equivalent per week     Comment: social   OB History    Gravida Para Term Preterm AB TAB SAB Ectopic Multiple Living   _0 0 _1 0 0 1     Review of Systems  All other systems reviewed and are negative.   Allergies  Ampicillin; Amoxicillin; and Sulfa antibiotics  Home Medications   Prior to Admission medications   Medication Sig Start Date End Date Taking? Authorizing Provider  albuterol (PROVENTIL HFA;VENTOLIN HFA) 108 (90 Base) MCG/ACT inhaler Inhale 2 puffs into the lungs every 6 (six) hours as needed for wheezing or shortness of breath. 06/06/15  Yes SuVersie StarksPA-C  losartan (COZAAR) 100 MG tablet Take 1 tablet (100 mg total) by mouth daily. 03/29/15  Yes Tommie AnHomero FellersFNP  potassium chloride (K-DUR) 10 MEQ tablet Take 10 mEq by mouth daily.   Yes Historical Provider, MD  acetaminophen (TYLENOL) 500 MG tablet Take 500 mg by mouth every 6 (six) hours as needed for moderate pain or headache.    Historical Provider, MD  azithromycin (ZITHROMAX Z-PAK) 250 MG tablet As directed 06/06/15   SuVersie StarksPA-C  benzonatate (TESSALON) 200 MG capsule  Take 1 capsule (200 mg total) by mouth 2 (two) times daily as needed for cough. Patient not taking: Reported on 04/14/2015 03/29/15   Blima Singer, FNP  brompheniramine-pseudoephedrine-DM 30-2-10 MG/5ML syrup Take 5 mLs by mouth 4 (four) times daily as needed. Patient not taking: Reported on 04/14/2015 02/11/15   Sable Feil, PA-C  cyclobenzaprine (FLEXERIL) 10 MG tablet Take 1 tablet (10 mg total) by mouth 2 (two) times daily as needed for muscle spasms. Patient not taking: Reported on 04/14/2015 01/12/14   Evelina Bucy, MD  fluticasone [facility]) 50 MCG/ACT nasal spray Place 2 sprays into both nostrils daily. 01/15/15   Praveen Mannam, MD  furosemide (LASIX) 20 MG tablet Take 20 mg by mouth daily.    Historical Provider, MD  HYDROcodone-acetaminophen (NORCO/VICODIN) 5-325 MG  tablet Take 1-2 tablets by mouth every 6 (six) hours as needed. 09/07/15   Rhetta Mura Xitlaly Ault, NP  methylPREDNISolone (MEDROL DOSEPAK) 4 MG TBPK tablet Take 6 pills on day one then decrease by 1 pill each day 06/06/15   Versie Starks, PA-C  naproxen (NAPROSYN) 500 MG tablet Take 1 tablet (500 mg total) by mouth 2 (two) times daily. For 7-10 days 09/07/15   Rhetta Mura Kielyn Kardell, NP  sodium chloride (OCEAN) 0.65 % SOLN nasal spray Place 1 spray into both nostrils as needed for congestion.    Historical Provider, MD  traMADol (ULTRAM) 50 MG tablet Take 1 tablet (50 mg total) by mouth every 6 (six) hours as needed for moderate pain or severe pain. 09/07/15   Rhetta Mura Joplin Canty, NP   Meds Ordered and Administered this Visit  Medications - No data to display  BP 142/95 mmHg  Pulse 101  Temp(Src) 98.2 F (36.8 C) (Oral)  Resp 22  SpO2 90%  LMP 05/11/2013 No data found.   Physical Exam  Constitutional: She is oriented to person, place, and time. She appears well-developed and well-nourished.  HENT:  Head: Normocephalic and atraumatic.  Eyes: Conjunctivae are normal.  Neck: Neck supple.  Cardiovascular: Normal rate.   Pulmonary/Chest: Effort normal.  Musculoskeletal:  Swelling and slight erythema to first MTPJ of (L) great toe. No deformity or open wound.  Neurological: She is alert and oriented to person, place, and time.  Skin: Skin is warm and dry.  Psychiatric: She has a normal mood and affect.    ED Course  Procedures (including critical care time)  Labs Review Labs Reviewed - No data to display  Imaging Review Dg Toe Great Left  09/07/2015  CLINICAL DATA:  Left great toe pain for the past week. No known injury. EXAM: LEFT GREAT TOE COMPARISON:  None. FINDINGS: There is no evidence of fracture or dislocation. There is no evidence of arthropathy or other focal bone abnormality. Soft tissues are unremarkable. IMPRESSION: Normal examination. Electronically Signed   By: Claudie Revering M.D.    On: 09/07/2015 20:40     Visual Acuity Review  Right Eye Distance:   Left Eye Distance:   Bilateral Distance:    Right Eye Near:   Left Eye Near:    Bilateral Near:         MDM   1. Acute gout of left foot, unspecified cause   Imaging negative for anatomical source of pain.  Symptoms and PE c/w acute gout. Rx: Naprosyn 500 mg BID x 7-10 days Rx: Vicodin 1=2 q6h PRN #15 Rx: Tramadol 50 mg q6h PRN #10    Jeryl Columbia, NP 09/08/15 1206

## 2015-09-07 NOTE — Discharge Instructions (Signed)
Gout °Gout is when your joints become red, sore, and swell (inflamed). This is caused by the buildup of uric acid crystals in the joints. Uric acid is a chemical that is normally in the blood. If the level of uric acid gets too high in the blood, these crystals form in your joints and tissues. Over time, these crystals can form into masses near the joints and tissues. These masses can destroy bone and cause the bone to look misshapen (deformed). °HOME CARE  °· Do not take aspirin for pain. °· Only take medicine as told by your doctor. °· Rest the joint as much as you can. When in bed, keep sheets and blankets off painful areas. °· Keep the sore joints raised (elevated). °· Put warm or cold packs on painful joints. Use of warm or cold packs depends on which works best for you. °· Use crutches if the painful joint is in your leg. °· Drink enough fluids to keep your pee (urine) clear or pale yellow. Limit alcohol, sugary drinks, and drinks with fructose in them. °· Follow your diet instructions. Pay careful attention to how much protein you eat. Include fruits, vegetables, whole grains, and fat-free or low-fat milk products in your daily diet. Talk to your doctor or dietitian about the use of coffee, vitamin C, and cherries. These may help lower uric acid levels. °· Keep a healthy body weight. °GET HELP RIGHT AWAY IF:  °· You have watery poop (diarrhea), throw up (vomit), or have any side effects from medicines. °· You do not feel better in 24 hours, or you are getting worse. °· Your joint becomes suddenly more tender, and you have chills or a fever. °MAKE SURE YOU:  °· Understand these instructions. °· Will watch your condition. °· Will get help right away if you are not doing well or get worse. °  °This information is not intended to replace advice given to you by your health care provider. Make sure you discuss any questions you have with your health care provider. °  °Document Released: 12/24/2007 Document Revised:  04/06/2014 Document Reviewed: 10/28/2011 °Elsevier Interactive Patient Education ©2016 Elsevier Inc. ° °

## 2015-09-07 NOTE — ED Notes (Signed)
Pt c/o left greater toe pain onset 1.5 weeks... Denies inj/trauma... Pain increases w/activity... A&O x4... No acute distress.

## 2015-09-30 DIAGNOSIS — J309 Allergic rhinitis, unspecified: Secondary | ICD-10-CM | POA: Diagnosis not present

## 2015-09-30 DIAGNOSIS — D582 Other hemoglobinopathies: Secondary | ICD-10-CM | POA: Diagnosis not present

## 2015-09-30 DIAGNOSIS — I10 Essential (primary) hypertension: Secondary | ICD-10-CM | POA: Diagnosis not present

## 2015-09-30 DIAGNOSIS — R0601 Orthopnea: Secondary | ICD-10-CM | POA: Diagnosis not present

## 2015-10-11 DIAGNOSIS — I5033 Acute on chronic diastolic (congestive) heart failure: Secondary | ICD-10-CM | POA: Diagnosis not present

## 2015-10-11 DIAGNOSIS — I493 Ventricular premature depolarization: Secondary | ICD-10-CM | POA: Diagnosis not present

## 2015-10-11 DIAGNOSIS — R9431 Abnormal electrocardiogram [ECG] [EKG]: Secondary | ICD-10-CM | POA: Diagnosis not present

## 2015-10-11 DIAGNOSIS — I10 Essential (primary) hypertension: Secondary | ICD-10-CM | POA: Diagnosis not present

## 2015-10-17 DIAGNOSIS — E1165 Type 2 diabetes mellitus with hyperglycemia: Secondary | ICD-10-CM | POA: Diagnosis not present

## 2015-10-17 DIAGNOSIS — E1121 Type 2 diabetes mellitus with diabetic nephropathy: Secondary | ICD-10-CM | POA: Diagnosis not present

## 2015-10-17 DIAGNOSIS — G4733 Obstructive sleep apnea (adult) (pediatric): Secondary | ICD-10-CM | POA: Diagnosis not present

## 2015-10-17 DIAGNOSIS — I10 Essential (primary) hypertension: Secondary | ICD-10-CM | POA: Diagnosis not present

## 2015-10-17 DIAGNOSIS — I509 Heart failure, unspecified: Secondary | ICD-10-CM | POA: Diagnosis not present

## 2015-10-21 DIAGNOSIS — I5033 Acute on chronic diastolic (congestive) heart failure: Secondary | ICD-10-CM | POA: Diagnosis not present

## 2015-10-21 DIAGNOSIS — R0602 Shortness of breath: Secondary | ICD-10-CM | POA: Diagnosis not present

## 2015-10-21 DIAGNOSIS — R9431 Abnormal electrocardiogram [ECG] [EKG]: Secondary | ICD-10-CM | POA: Diagnosis not present

## 2015-10-29 DIAGNOSIS — R0602 Shortness of breath: Secondary | ICD-10-CM | POA: Diagnosis not present

## 2015-10-29 DIAGNOSIS — R600 Localized edema: Secondary | ICD-10-CM | POA: Diagnosis not present

## 2015-11-06 DIAGNOSIS — I5033 Acute on chronic diastolic (congestive) heart failure: Secondary | ICD-10-CM | POA: Diagnosis not present

## 2015-11-06 DIAGNOSIS — I10 Essential (primary) hypertension: Secondary | ICD-10-CM | POA: Diagnosis not present

## 2015-11-06 DIAGNOSIS — R9431 Abnormal electrocardiogram [ECG] [EKG]: Secondary | ICD-10-CM | POA: Diagnosis not present

## 2015-11-06 DIAGNOSIS — I493 Ventricular premature depolarization: Secondary | ICD-10-CM | POA: Diagnosis not present

## 2015-11-18 ENCOUNTER — Other Ambulatory Visit: Payer: Self-pay | Admitting: Family Medicine

## 2015-11-18 ENCOUNTER — Ambulatory Visit
Admission: RE | Admit: 2015-11-18 | Discharge: 2015-11-18 | Disposition: A | Discharge status: Home / Self Care | Payer: BLUE CROSS/BLUE SHIELD | Source: Ambulatory Visit | Attending: Family Medicine | Admitting: Family Medicine

## 2015-11-18 DIAGNOSIS — R0602 Shortness of breath: Secondary | ICD-10-CM

## 2015-11-18 DIAGNOSIS — R300 Dysuria: Secondary | ICD-10-CM | POA: Diagnosis not present

## 2015-11-18 DIAGNOSIS — R053 Chronic cough: Secondary | ICD-10-CM

## 2015-11-18 DIAGNOSIS — R05 Cough: Secondary | ICD-10-CM

## 2015-11-18 DIAGNOSIS — J209 Acute bronchitis, unspecified: Secondary | ICD-10-CM | POA: Diagnosis not present

## 2015-11-18 DIAGNOSIS — I10 Essential (primary) hypertension: Secondary | ICD-10-CM | POA: Diagnosis not present

## 2015-12-09 DIAGNOSIS — R0609 Other forms of dyspnea: Secondary | ICD-10-CM

## 2015-12-09 DIAGNOSIS — R0600 Dyspnea, unspecified: Secondary | ICD-10-CM | POA: Diagnosis present

## 2015-12-09 NOTE — H&P (Signed)
OFFICE VISIT NOTES COPIED TO EPIC FOR DOCUMENTATION  . History of Present Illness [name] AGNP-C; 11/08/15 11:29 AM) The patient is a 50 year old female who presents for a Follow-up for Hypertension. She has a history of HTN, hyperglycemia, and morbid obesity. She presents for evaluation of shortness of breath, orthopnea, and lower extremity edema. She states symptoms have been worsening over the past one to two years since her hysterectomy in 2015. She recently changed primary care providers and was referred to our office for further evaluation and management.  She denies chest pain, palpitations, dizziness, syncope, or symptoms suggestive of TIA or claudication. She was scheduled for echocardiogram for evaluation of cardiomyopathy/cor pulmonale as well as nuclear stress test due to dyspnea and abnormal EKG revealing frequent ventricular ectopy. She presents today for follow-up. She reports improvement in dyspnea and edema since addition of spironolactone.   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 at age 71 from Natural Causes; Hx of Kidney Failure Father  Living; Has HTN Sister 1  Younger; Hx of 3 CVA's starting in her mid 38's Brother 2  Older; 1-HTN, DM  Social History ([name]; 11/08/2015 11:00 AM) Current tobacco use  Former smoker. Quit 2012 Alcohol Use  Occasional alcohol use. Marital status  Single. Number  of Children  1. Living Situation  Lives alone.  Past Surgical History (Tiffany Louretta Shorten; November 08, 2015 11:00 AM) Cesarean Delivery 1987 Hysterectomy (not due to cancer) - Partial 2015  Medication History ([name]; 11-08-15 11:07 AM) Spironolactone (50MG Tablet, 1 (one) Tablet Oral daily, Taken starting 10/11/2015) Active. Furosemide (20MG Tablet, 1-2 Oral daily) Active. Losartan Potassium (100MG Tablet, 1 Oral daily) Active. AmLODIPine Besylate (5MG Tablet, 1 Oral daily) Active. Fluticasone Propionate (50MCG/ACT Suspension, 2 sprays each nostril Nasal up to 4 times daily as needed) Active. Hydrocodone-Acetaminophen (5-325MG Tablet, 1 Oral as needed) Active. TraMADol HCl (50MG Tablet, 1 Oral as needed) Active. ProAir HFA (108 (90 Base)MCG/ACT Aerosol Soln, 2 puffs Inhalation as needed) Active. Medications Reconciled (Verbally)  Diagnostic Studies History ([name]; Nov 08, 2015 11:02 AM) Echocardiogram 10/29/2015 1. Poor echo window due to bodily habitus. Left ventricle cavity is normal in size. Normal global wall motion. Doppler evidence of grade II (pseudonormal) diastolic dysfunction. Diastolic dysfunction findings suggests elevated LA/LV end diastolic pressure. Calculated EF 77%. 2. Right atrial cavity is moderately dilated. 3. Right ventricle cavity is moderately dilated. Moderately reduced right ventricular function. 4. Trace tricuspid regurgitation. Unable to estimate PA pressure due to absence/minimal TR signal. 4. Mild to moderate pulmonic regurgitation. 5. IVC is dilated with poor inspiration collapse consistent with elevated right atrial pressure. Nuclear stress test 10/21/2015 1. Resting EKG demonstrated normal sinus rhythm, nonspecific T of rheumatic. Stress EKG is nondiagnostic for ischemia as except pharmacologic stress test. Stress symptoms included dizziness. Stress terminated due to completion of for neurologic stress protocol. 2. The raw images reveal mild  breast attenuation artifact in the inferior wall, patient scabnned sitting. Perfusion imaging study demonstrated a moderate-sized moderate to severe inferior wall perfusion abnormality which remains unchanged from rest and stress images, probably suggest  prominent soft tissue attenuation. Left ventricular systolic function calculated at 53% without regional wall motion abnormality again suggesting the defect noted above is probably a soft tissue attenuation than a scar.This is a low risk study. Labwork  09/30/2015: BNP 712, CBC normal, potassium 4.1 05/14/2015: total cholesterol 161, triglycerides 187, HDL 36, LDL 104, HbA1c 6.6%, glucose 103, creatinine 0.77, potassium 4.2 Sleep Study 2015 Positive for Sleep Apnea; Uses CPAP sometimes  Review of Systems ([name] AGNP-C; 11/06/2015 11:29 AM) General Not Present- Anorexia, Fatigue and Fever. Respiratory Present- Decreased Exercise Tolerance and Difficulty Breathing on Exertion (improving). Not Present- Cough and Dyspnea. Cardiovascular Present- Edema (improving) and Orthopnea. Not Present- Chest Pain, Claudications, Palpitations and Paroxysmal Nocturnal Dyspnea. Gastrointestinal Not Present- Black, Tarry Stool, Change in Bowel Habits and Nausea. Neurological Not Present- Focal Neurological Symptoms and Syncope. Endocrine Not Present- Cold Intolerance, Excessive Sweating, Heat Intolerance and Thyroid Problems. Hematology Not Present- Anemia, Easy Bruising, Petechiae and Prolonged Bleeding.  Vitals ([name]; 11/06/2015 11:09 AM) 11/06/2015 11:02 AM Weight: 239.56 lb Height: 65in Body Surface Area: 2.14 m Body Mass Index: 39.86 kg/m  Pulse: 99 (Regular)  P.OX: 97% (Room air) BP: 124/82 (Sitting, Left Arm, Standard)       Physical Exam ([name] AGNP-C; 11/06/2015 11:30 AM) General Mental Status-Alert. General Appearance-Cooperative and Appears stated age. Build & Nutrition-Moderately built and Morbidly  obese.  Head and Neck Thyroid Gland Characteristics - normal size and consistency and no palpable nodules.  Chest and Lung Exam Chest and lung exam reveals -quiet, even and easy respiratory effort with no use of accessory muscles, non-tender and on auscultation, normal breath sounds, no adventitious sounds.  Cardiovascular Cardiovascular examination reveals -normal heart sounds, regular rate and rhythm with no murmurs, carotid auscultation reveals no bruits and abdominal aorta auscultation reveals no bruits and no prominent pulsation.  Abdomen Palpation/Percussion Normal exam - Non Tender and No hepatosplenomegaly.  Peripheral Vascular Lower Extremity Inspection - Bilateral - Pigmented(venous stasis pigmentation) and Shiny atrophic skin. Palpation - Edema - Bilateral - 1+ Pitting edema. Femoral pulse - Bilateral - Feeble, No Bruits. Popliteal pulse - Bilateral - Feeble. Dorsalis pedis pulse - Bilateral - Normal. Posterior tibial pulse - Bilateral - Absent(due to edema).  Neurologic Neurologic evaluation reveals -alert and oriented x 3 with no impairment of recent or remote memory. Motor-Grossly intact without any focal deficits.  Musculoskeletal Global Assessment Left Lower Extremity - no deformities, masses or tenderness, no known fractures. Right Lower Extremity - no deformities, masses or tenderness, no known fractures.    Assessment & Plan ([name] AGNP-C; 11/06/2015 11:28 AM) Acute on chronic diastolic heart failure (J57.01) Benign essential hypertension (I10) Impression: EKG 10/11/2015: Normal sinus rhythm at the rate of 97 bpm,. P- Pulmonale, bi-atrial abnormality frequent PVCs. Abnormal EKG. 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 I discussed primary/secondary prevention and also  dietary counseling was done. She presents for follow-up of echocardiogram and nuclear stress test due to shortness of breath and edema. Stress test was normal without evidence of ischemia. Echocardiogram revealed normal LVEF but with evidence of cor pulmonale. Unfortunately, unable to measure PA pressures due to absence of TR signal. Due to ongoing symptoms, will schedule for right heart catheterization for definitive evaluation of pulmonary hypertension/cor pulmonale. Follow-up after catheterization for reevaluation and further recommendations.   Signed by [name], AGNP-C (11/06/2015 11:30 AM)

## 2015-12-19 ENCOUNTER — Telehealth: Payer: Self-pay | Admitting: Pulmonary Disease

## 2015-12-19 ENCOUNTER — Ambulatory Visit (INDEPENDENT_AMBULATORY_CARE_PROVIDER_SITE_OTHER): Payer: BLUE CROSS/BLUE SHIELD | Admitting: Pulmonary Disease

## 2015-12-19 ENCOUNTER — Encounter: Payer: Self-pay | Admitting: Pulmonary Disease

## 2015-12-19 VITALS — BP 132/74 | HR 75 | Ht 65.0 in | Wt 242.0 lb

## 2015-12-19 DIAGNOSIS — R0600 Dyspnea, unspecified: Secondary | ICD-10-CM

## 2015-12-19 DIAGNOSIS — R0609 Other forms of dyspnea: Secondary | ICD-10-CM

## 2015-12-19 MED ORDER — CETIRIZINE HCL 10 MG PO TABS: 10.0000 mg | ORAL_TABLET | Freq: Every day | ORAL | 5 refills | Status: DC

## 2015-12-19 MED ORDER — FLUTICASONE-SALMETEROL 250-50 MCG/DOSE IN AEPB: 1.0000 | INHALATION_SPRAY | Freq: Two times a day (BID) | RESPIRATORY_TRACT | 5 refills | Status: DC

## 2015-12-19 MED ORDER — AZELASTINE-FLUTICASONE 137-50 MCG/ACT NA SUSP: 1.0000 | Freq: Two times a day (BID) | NASAL | 5 refills | Status: DC

## 2015-12-19 NOTE — Telephone Encounter (Signed)
dymista was approved. Pt notified and pharmacy. Nothing further needed

## 2015-12-19 NOTE — Telephone Encounter (Signed)
PA initiated through [facility] for [place]: [id number]  Your information has been submitted to [employer] [place]. [place] will review the request and fax you a determination directly, typically within 3 business days of your submission once all necessary information is received. If [facility] has not responded in 3 business days or if you have any questions about your submission, contact [place] at [phone].

## 2015-12-19 NOTE — Progress Notes (Signed)
[name]    [id number]    11/15/1965  Primary Care Physician:FULP, CAMMIE, MD  Referring Physician: Lottie Dawson, MD [name]  [address], [place] [address]  Chief complaint:  Follow up for dyspnea on exertion, cough.  HPI: [name] is a 50 year old with past medical history of DVT, PE in 2014, obstructive sleep apnea., Obesity, and deficiency anemia. She complains of dyspnea on exertion. His symptoms started after she was diagnosed with a DVT and PE in 2014. This was apparently unprovoked and hypercoaguable panel was negative she was given anticoagulation for about 6 months.  She also complains of chronic cough for about 6 months. Productive of whitish, greenish sputum. She denies any fevers or chills. She has chronic rhinitis and postnasal drip. She denies any history or symptoms of GERD. She was started on albuterol inhaler when necessary. She says that this does help with her dyspnea and her cough. She as given a flonase nasal spray last visit but did not help with symptoms. Eval by Cardiology Dr. Einar Fitzgerald noted with abnormal echo. Also noted to be hypoxic in office today.  She was diagnosed with OSA in 2015. She is on CPAP for a few months but then stopped because it was causing nasal congestion.  Outpatient Encounter Prescriptions as of 12/19/2015  Medication Sig  . acetaminophen (TYLENOL) 500 MG tablet Take 500 mg by mouth every 6 (six) hours as needed for moderate pain or headache.  . albuterol (PROVENTIL HFA;VENTOLIN HFA) 108 (90 Base) MCG/ACT inhaler Inhale 2 puffs into the lungs every 6 (six) hours as needed for wheezing or shortness of breath.  [place] Kitchen amLODipine (NORVASC) 5 MG tablet Take 1 tablet by mouth daily.  . fluticasone (FLONASE) 50 MCG/ACT nasal spray Place 2 sprays into both nostrils daily.  . furosemide (LASIX) 20 MG tablet Take 40 mg by mouth daily.   Mar[place] Kitchen levocetirizine (XYZAL) 5 MG tablet Take 1 tablet by mouth daily as  needed.  Mar[place] Kitchen losartan (COZAAR) 100 MG tablet Take 1 tablet (100 mg total) by mouth daily.  . promethazine-codeine (PHENERGAN WITH CODEINE) 6.25-10 MG/5ML syrup Take 5 mLs by mouth every 4 (four) hours as needed.  . sodium chloride (OCEAN) 0.65 % SOLN nasal spray Place 1 spray into both nostrils as needed for congestion.  Mar[place] Kitchen spironolactone (ALDACTONE) 50 MG tablet Take 1 tablet by mouth daily.   No facility-administered encounter medications on file as of 12/19/2015.     Allergies as of 12/19/2015 - Review Complete 12/19/2015  Allergen Reaction Noted  . Ampicillin Other (See Comments) 04/28/2011  . Amoxicillin Other (See Comments) 01/12/2014  . Sulfa antibiotics Other (See Comments) 04/28/2011    Past Medical History:  Diagnosis Date  . Anemia    iron deficiency  . Diabetes mellitus, type II ([place])   . DVT (deep venous thrombosis) ([place]) 2014   left leg  . HTN (hypertension)   . Medical history non-contributory   . Menorrhagia    secondary to uterine fibroids  . OSA (obstructive sleep apnea)    07/25/13 HST AHI 33/hr, severe hypoxemia O2 min 42% and 95% of the time <89%  . PE (pulmonary embolism) 2014   bilateral  . Pulmonary nodule    (19m on loeft lower lobe) found on CT scan July 2014, repeat scan Jan 2015 showed less than 476m . S/P TAH (total abdominal hysterectomy) 06/07/2013    Past Surgical History:  Procedure Laterality Date  .  ABDOMINAL HYSTERECTOMY N/A 06/07/2013   Procedure: HYSTERECTOMY ABDOMINAL WITH BIALTERAL SALPINGECTOMY;  Surgeon: Elveria Royals, MD;  Location: [facility];  Service: Gynecology;  Laterality: N/A;  . CESAREAN SECTION    . POLYPECTOMY  2008   Removal of uterine polyp    Family History  Problem Relation Age of Onset  . Hypertension Mother   . Kidney disease Mother   . Hypertension Father   . Stroke Sister     Social History   Social History  . Marital status: Legally Separated    Spouse name: N/A  . Number of children: N/A  . Years of  education: N/A   Occupational History  . Not on file.   Social History Main Topics  . Smoking status: Former Smoker    Packs/day: 0.33    Years: 10.00    Types: Cigarettes    Quit date: 10/04/2011  . Smokeless tobacco: Never Used  . Alcohol use 0.0 oz/week     Comment: social  . Drug use: No  . Sexual activity: Yes   Other Topics Concern  . 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  HENT: Negative.   Eyes: Negative for blurred vision.  Respiratory: as per HPI  Cardiovascular: Negative for chest pain and palpitations.  Gastrointestinal: Negative for vomiting, diarrhea, blood per rectum. Genitourinary: Negative for dysuria, urgency, frequency and hematuria.  Musculoskeletal: Negative for myalgias, back pain and joint pain.  Skin: Negative for itching and rash.  Neurological: Negative for dizziness, tremors, focal weakness, seizures and loss of consciousness.  Endo/Heme/Allergies: Negative for environmental allergies.  Psychiatric/Behavioral: Negative for depression, suicidal ideas and hallucinations.  All other systems reviewed and are negative.   Physical Exam: Blood pressure 132/74, pulse 75, height _0  (1.651 m), weight 109.8 kg (242 lb), last menstrual period 05/11/2013, SpO2 93 %. Gen:      No acute distress HEENT:  EOMI, sclera anicteric Neck:     No masses; no thyromegaly Lungs:    Clear to auscultation bilaterally; normal respiratory effort CV:         Regular rate and rhythm; no murmurs Abd:      + bowel sounds; soft, non-tender; no palpable masses, no distension Ext:    No edema; adequate peripheral perfusion Skin:      Warm and dry; no rash Neuro: alert and oriented x 3 Psych: normal mood and affect  Data Reviewed: Chest x ray 11/18/15- No active cardiopulmonary disease.  Images reviewed.  PFTs 08/07/14 FVC 2.32 (76%) FEV1 1.93 [70%) F/F 83 DLCO 62% Minimal obstructive lung disease, restriction possible. Moderate reduction in diffusion capacity.  DATA from Dr. Einar Fitzgerald Echocardiogram 10/29/2015 1. Poor echo window due to bodily habitus. Left ventricle cavity is normal in size. Normal global wall motion. Doppler evidence of grade II (pseudonormal) diastolic dysfunction. Diastolic dysfunction findings suggests elevated LA/LV end diastolic pressure. Calculated EF 77%. 2. Right atrial cavity is moderately dilated. 3. Right ventricle cavity is moderately dilated. Moderately reduced right ventricular function. 4. Trace tricuspid regurgitation. Unable to estimate PA pressure due to absence/minimal TR signal. 4. Mild to moderate pulmonic regurgitation. 5. IVC is dilated with poor inspiration collapse consistent with elevated right atrial pressure.  Nuclear stress test 10/21/2015 1. Resting EKG demonstrated normal sinus rhythm, nonspecific T of rheumatic. Stress  EKG is nondiagnostic for ischemia as except pharmacologic stress test. Stress symptoms included dizziness. Stress terminated due to completion of for neurologic stress protocol. 2. The raw images reveal mild breast attenuation artifact in the inferior wall, patient scabnned sitting. Perfusion imaging study demonstrated a moderate-sized moderate to severe inferior wall perfusion abnormality which remains unchanged from rest and stress images, probably suggest prominent soft tissue attenuation. Left ventricular systolic function calculated at 53% without regional wall motion abnormality again suggesting the defect noted above is probably a soft tissue attenuation than a scar.This is a low risk study.  Assessment:  #1 Dyspnea on exertion, RV dysfunction, cor pulmonale Her PFTs are suggestive of mild restriction as shown by reduction in FVC, FEV1. There is a moderate reduction in DLCO which corrects for alveolar  capacity.  While the pattern may be secondary to obesity and body habitus we need to evaluate for a pulmonary vascular process esp in light of recent echo with RV dilatation, reduction in function and prior PEs and DVT. There is no evidence on CXR of interstitial lung disease.  I will order a V/Q scan to eval for chronic thromboembolic disease. She is scheduled for a RHC by Dr. Einar Fitzgerald this month. We will also need to work on correcting her hypoxia and treat OSA. I will start advair 250/50 and supplemental O2.  #2 Chronic cough. Secondary to her rhinitis and postnasal drip. She did not have any improvement with albuterol or Flonase nasal spray.  I will add zyrtec antihistamine and dymista nasal spray.  #3 OSA She has significant OSA with significant desaturations to 42% in a sleep study done in 2015. Her untreated OSA might also be contributing to her symptoms and RV dysfunction. I'll try to get the results of her prior study and the CPAP settings. I have instructed her to be more compliant with CPAP.  Plan/Recommendations: - V/Q scan, RHC already scheduled. - Start advair 250/50 - Start zyrtec and dymista nasal spray - Strongly advised to be compliant with CPAP. Get CPAP download. - Start supplemental O2.  Follow up in 1 month  Marshell Garfinkel MD [facility] Pager [phone] 12/19/2015, 9:52 AM  CC: Shamleffer, Ibethal JarAdrian Prows MD

## 2015-12-19 NOTE — Patient Instructions (Signed)
We will start you on supplemental O2. Please start using the CPAP for OSA regularly. I will start zyrtec 10 mg at night. Start using the dymista nasal spray We will also start you on advair 250/50.  You will be scheduled for V/Q scan  Please follow up in 1 month.

## 2015-12-23 DIAGNOSIS — G4733 Obstructive sleep apnea (adult) (pediatric): Secondary | ICD-10-CM | POA: Diagnosis not present

## 2015-12-24 ENCOUNTER — Ambulatory Visit (HOSPITAL_COMMUNITY)
Admission: RE | Admit: 2015-12-24 | Discharge: 2015-12-24 | Disposition: A | Discharge status: Home / Self Care | Payer: BLUE CROSS/BLUE SHIELD | Source: Ambulatory Visit | Attending: Cardiology | Admitting: Cardiology

## 2015-12-24 ENCOUNTER — Encounter (HOSPITAL_COMMUNITY): Payer: Self-pay | Admitting: Cardiology

## 2015-12-24 ENCOUNTER — Encounter (HOSPITAL_COMMUNITY)
Admission: RE | Disposition: A | Discharge status: Home / Self Care | Payer: Self-pay | Source: Ambulatory Visit | Attending: Cardiology

## 2015-12-24 DIAGNOSIS — Z882 Allergy status to sulfonamides status: Secondary | ICD-10-CM | POA: Diagnosis not present

## 2015-12-24 DIAGNOSIS — Z833 Family history of diabetes mellitus: Secondary | ICD-10-CM | POA: Diagnosis not present

## 2015-12-24 DIAGNOSIS — Z6839 Body mass index (BMI) 39.0-39.9, adult: Secondary | ICD-10-CM | POA: Insufficient documentation

## 2015-12-24 DIAGNOSIS — I5033 Acute on chronic diastolic (congestive) heart failure: Secondary | ICD-10-CM | POA: Diagnosis not present

## 2015-12-24 DIAGNOSIS — I270 Primary pulmonary hypertension: Secondary | ICD-10-CM | POA: Insufficient documentation

## 2015-12-24 DIAGNOSIS — Z823 Family history of stroke: Secondary | ICD-10-CM | POA: Diagnosis not present

## 2015-12-24 DIAGNOSIS — Z87891 Personal history of nicotine dependence: Secondary | ICD-10-CM | POA: Insufficient documentation

## 2015-12-24 DIAGNOSIS — R7303 Prediabetes: Secondary | ICD-10-CM | POA: Diagnosis not present

## 2015-12-24 DIAGNOSIS — Z8249 Family history of ischemic heart disease and other diseases of the circulatory system: Secondary | ICD-10-CM | POA: Diagnosis not present

## 2015-12-24 DIAGNOSIS — R600 Localized edema: Secondary | ICD-10-CM | POA: Diagnosis not present

## 2015-12-24 DIAGNOSIS — I493 Ventricular premature depolarization: Secondary | ICD-10-CM | POA: Diagnosis not present

## 2015-12-24 DIAGNOSIS — R0601 Orthopnea: Secondary | ICD-10-CM | POA: Insufficient documentation

## 2015-12-24 DIAGNOSIS — Z880 Allergy status to penicillin: Secondary | ICD-10-CM | POA: Insufficient documentation

## 2015-12-24 DIAGNOSIS — R0609 Other forms of dyspnea: Secondary | ICD-10-CM

## 2015-12-24 DIAGNOSIS — R0600 Dyspnea, unspecified: Secondary | ICD-10-CM | POA: Diagnosis present

## 2015-12-24 DIAGNOSIS — E785 Hyperlipidemia, unspecified: Secondary | ICD-10-CM | POA: Insufficient documentation

## 2015-12-24 DIAGNOSIS — J309 Allergic rhinitis, unspecified: Secondary | ICD-10-CM | POA: Diagnosis not present

## 2015-12-24 DIAGNOSIS — I272 Other secondary pulmonary hypertension: Secondary | ICD-10-CM | POA: Diagnosis not present

## 2015-12-24 HISTORY — PX: CARDIAC CATHETERIZATION: SHX172

## 2015-12-24 LAB — POCT I-STAT 3, VENOUS BLOOD GAS (G3P V)
ACID-BASE EXCESS: 3 mmol/L — AB (ref 0.0–2.0)
ACID-BASE EXCESS: 5 mmol/L — AB (ref 0.0–2.0)
Acid-Base Excess: 4 mmol/L — ABNORMAL HIGH (ref 0.0–2.0)
BICARBONATE: 28.9 mmol/L — AB (ref 20.0–28.0)
BICARBONATE: 30.2 mmol/L — AB (ref 20.0–28.0)
BICARBONATE: 30.7 mmol/L — AB (ref 20.0–28.0)
O2 SAT: 44 %
O2 SAT: 56 %
O2 SAT: 58 %
PCO2 VEN: 49.5 mmHg (ref 44.0–60.0)
PH VEN: 7.398 (ref 7.250–7.430)
TCO2: 32 mmol/L (ref 0–100)
TCO2: 32 mmol/L (ref 0–100)
TCO2: 30 mmol/L (ref 0–100)
pCO2, Ven: 49.2 mmHg (ref 44.0–60.0)
pCO2, Ven: 49.8 mmHg (ref 44.0–60.0)
pH, Ven: 7.377 (ref 7.250–7.430)
pH, Ven: 7.394 (ref 7.250–7.430)
pO2, Ven: 25 mmHg — CL (ref 32.0–45.0)
pO2, Ven: 30 mmHg — CL (ref 32.0–45.0)
pO2, Ven: 31 mmHg — CL (ref 32.0–45.0)

## 2015-12-24 LAB — BASIC METABOLIC PANEL
Anion gap: 9 (ref 5–15)
BUN: 9 mg/dL (ref 6–20)
CHLORIDE: 102 mmol/L (ref 101–111)
CO2: 27 mmol/L (ref 22–32)
CREATININE: 0.8 mg/dL (ref 0.44–1.00)
Calcium: 8.9 mg/dL (ref 8.9–10.3)
Glucose, Bld: 225 mg/dL — ABNORMAL HIGH (ref 65–99)
Potassium: 4.6 mmol/L (ref 3.5–5.1)
SODIUM: 138 mmol/L (ref 135–145)

## 2015-12-24 LAB — PROTIME-INR
INR: 1.16
PROTHROMBIN TIME: 14.9 s (ref 11.4–15.2)

## 2015-12-24 LAB — CBC
HCT: 51.1 % — ABNORMAL HIGH (ref 36.0–46.0)
HEMOGLOBIN: 17.3 g/dL — AB (ref 12.0–15.0)
MCH: 33.6 pg (ref 26.0–34.0)
MCHC: 33.9 g/dL (ref 30.0–36.0)
MCV: 99.2 fL (ref 78.0–100.0)
PLATELETS: 187 10*3/uL (ref 150–400)
RBC: 5.15 MIL/uL — AB (ref 3.87–5.11)
RDW: 13.1 % (ref 11.5–15.5)
WBC: 8.3 10*3/uL (ref 4.0–10.5)

## 2015-12-24 LAB — GLUCOSE, CAPILLARY: Glucose-Capillary: 225 mg/dL — ABNORMAL HIGH (ref 65–99)

## 2015-12-24 SURGERY — RIGHT HEART CATH

## 2015-12-24 MED ORDER — SODIUM CHLORIDE 0.9 % IV SOLN: 250.0000 mL | INTRAVENOUS | Status: DC | PRN

## 2015-12-24 MED ORDER — LIDOCAINE HCL (PF) 1 % IJ SOLN
INTRAMUSCULAR | Status: AC
  Filled 2015-12-24: qty 30

## 2015-12-24 MED ORDER — SODIUM CHLORIDE 0.9% FLUSH: 3.0000 mL | Freq: Two times a day (BID) | INTRAVENOUS | Status: DC

## 2015-12-24 MED ORDER — SODIUM CHLORIDE 0.9 % IV SOLN
INTRAVENOUS | Status: DC
  Administered 2015-12-24: 08:00:00 via INTRAVENOUS

## 2015-12-24 MED ORDER — LABETALOL HCL 5 MG/ML IV SOLN
INTRAVENOUS | Status: AC
  Filled 2015-12-24: qty 4

## 2015-12-24 MED ORDER — NITROGLYCERIN 1 MG/10 ML FOR IR/CATH LAB
INTRA_ARTERIAL | Status: AC
  Filled 2015-12-24: qty 10

## 2015-12-24 MED ORDER — LIDOCAINE HCL (PF) 1 % IJ SOLN
INTRAMUSCULAR | Status: DC | PRN
  Administered 2015-12-24: 2 mL

## 2015-12-24 MED ORDER — LABETALOL HCL 5 MG/ML IV SOLN
INTRAVENOUS | Status: DC | PRN
  Administered 2015-12-24: 20 mg via INTRAVENOUS

## 2015-12-24 MED ORDER — VERAPAMIL HCL 2.5 MG/ML IV SOLN
INTRAVENOUS | Status: DC | PRN
  Administered 2015-12-24: 5 mg

## 2015-12-24 MED ORDER — HEPARIN (PORCINE) IN NACL 2-0.9 UNIT/ML-% IJ SOLN
INTRAMUSCULAR | Status: AC
  Filled 2015-12-24: qty 500

## 2015-12-24 MED ORDER — SODIUM CHLORIDE 0.9% FLUSH: 3.0000 mL | INTRAVENOUS | Status: DC | PRN

## 2015-12-24 MED ORDER — HEPARIN (PORCINE) IN NACL 2-0.9 UNIT/ML-% IJ SOLN
INTRAMUSCULAR | Status: DC | PRN
  Administered 2015-12-24: 1000 mL

## 2015-12-24 MED ORDER — NITROGLYCERIN 1 MG/10 ML FOR IR/CATH LAB
INTRA_ARTERIAL | Status: DC | PRN
  Administered 2015-12-24: 10 mL

## 2015-12-24 MED ORDER — VERAPAMIL HCL 2.5 MG/ML IV SOLN
INTRAVENOUS | Status: AC
  Filled 2015-12-24: qty 2

## 2015-12-24 SURGICAL SUPPLY — 5 items
CATH BALLN WEDGE 5F 110CM (CATHETERS) ×2 IMPLANT
SHEATH FAST CATH BRACH 5F 5CM (SHEATH) ×2 IMPLANT
TRANSDUCER W/STOPCOCK (MISCELLANEOUS) ×2 IMPLANT
TUBING ART PRESS 72  MALE/FEM (TUBING) ×1
TUBING ART PRESS 72 MALE/FEM (TUBING) ×1 IMPLANT

## 2015-12-24 NOTE — Discharge Instructions (Signed)
Call Dr Irven Shelling office if any problems,questions, or concerns; call if any bleeding,drainage,fever,pain,swelling,or redness at IV sites

## 2015-12-24 NOTE — Interval H&P Note (Signed)
History and Physical Interval Note:  12/24/2015 9:15 AM  [name]  has presented today for surgery, with the diagnosis of shortness of breath   The various methods of treatment have been discussed with the patient and family. After consideration of risks, benefits and other options for treatment, the patient has consented to  Procedure(s): Right Heart Cath (N/A) as a surgical intervention .  The patient's history has been reviewed, patient examined, no change in status, stable for surgery.  I have reviewed the patient's chart and labs.  Questions were answered to the patient's satisfaction.     [name]

## 2015-12-25 ENCOUNTER — Ambulatory Visit (HOSPITAL_COMMUNITY): Payer: BLUE CROSS/BLUE SHIELD

## 2015-12-25 ENCOUNTER — Encounter: Payer: Self-pay | Admitting: Pulmonary Disease

## 2015-12-25 ENCOUNTER — Ambulatory Visit (HOSPITAL_COMMUNITY)
Admit: 2015-12-25 | Discharge: 2015-12-25 | Disposition: A | Discharge status: Home / Self Care | Payer: BLUE CROSS/BLUE SHIELD | Attending: Pulmonary Disease | Admitting: Pulmonary Disease

## 2015-12-25 DIAGNOSIS — R918 Other nonspecific abnormal finding of lung field: Secondary | ICD-10-CM | POA: Diagnosis not present

## 2015-12-25 DIAGNOSIS — R0600 Dyspnea, unspecified: Secondary | ICD-10-CM

## 2015-12-25 DIAGNOSIS — R0609 Other forms of dyspnea: Secondary | ICD-10-CM | POA: Insufficient documentation

## 2015-12-25 DIAGNOSIS — I517 Cardiomegaly: Secondary | ICD-10-CM | POA: Insufficient documentation

## 2015-12-25 DIAGNOSIS — R0602 Shortness of breath: Secondary | ICD-10-CM | POA: Diagnosis not present

## 2015-12-25 MED ORDER — TECHNETIUM TC 99M DIETHYLENETRIAME-PENTAACETIC ACID: 30.0000 | Freq: Once | INTRAVENOUS | Status: DC | PRN

## 2015-12-25 MED ORDER — TECHNETIUM TO 99M ALBUMIN AGGREGATED
4.0000 | Freq: Once | INTRAVENOUS | Status: AC | PRN
  Administered 2015-12-25: 4 via INTRAVENOUS

## 2015-12-25 NOTE — Progress Notes (Addendum)
Results of recent tests noted:  V/Q scan 12/25/15- Intermediate probability for PE.  RHC 12/24/15-  RA 20/16, mean 16 mmHg. RA saturation 56%. RV 78/9, EDP 20 mmHg. PA 81/36, mean 55 mmHg. PA saturation 58%. Aortic saturation 79%. PW 21/22, mean 18-20 mmHg. Fick cardiac output 5.79, cardiac index 2.69, preserved. QP/QS 1.0.  Impression: Findings are consistent with severe primary pulmonary hypertension. Mildly elevated PW mean is probably related to a component of diastolic dysfunction but clearly the PA pressures is out of proportion to minimally elevated PW mean pressure. Patient received 5 mg of intrapulmonary arterial verapamil and 1000 g of NTG without any significant change in pressure.  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   I tried to call the pt but got the voice mail, unable to leave a message as mail box if full. We will need to to start anticoagulation and evaluate for thromboendarterectomy. We will make appointment in office for discussion of results and further workup and referral.  Marshell Garfinkel MD [facility] Pager [phone] If no answer or after 3pm call: [phone] 12/25/2015, 6:30 PM   Addendum: I was able to left a message for a call back on 9/28.   Ordered Lovenox 110 mg SQ q12.  She will need to be scheduled for a follow visit next week to discuss results of test and treatment.    -

## 2015-12-25 NOTE — Progress Notes (Signed)
Patient ID: [name], female   DOB: [birth year], 50 y.o.   MRN: [id number]  VQ called , moderate prob D./w Dr Vaughan Browner, he knows pt well and will address now  Lavon Paganini. Titus Mould, MD, [place] Pgr: [facility]

## 2015-12-26 MED ORDER — ENOXAPARIN SODIUM 100 MG/ML ~~LOC~~ SOLN: 100.0000 mg | Freq: Two times a day (BID) | SUBCUTANEOUS | 5 refills | Status: DC

## 2015-12-26 NOTE — Progress Notes (Signed)
Pt returned call.  Discussed VQ scan results and PM's recommendations.  Pt voiced her understanding and denied any questions/concerns at this time.  Rx sent to verified pharmacy.  Appt scheduled w/ PM for 10.3.17 @ 12N.

## 2015-12-26 NOTE — Addendum Note (Signed)
Addended by: Parke Poisson E on: 12/26/2015 11:53 AM   Modules accepted: Orders

## 2015-12-30 DIAGNOSIS — J069 Acute upper respiratory infection, unspecified: Secondary | ICD-10-CM | POA: Diagnosis not present

## 2015-12-30 DIAGNOSIS — J019 Acute sinusitis, unspecified: Secondary | ICD-10-CM | POA: Diagnosis not present

## 2015-12-30 DIAGNOSIS — I509 Heart failure, unspecified: Secondary | ICD-10-CM | POA: Diagnosis not present

## 2015-12-30 DIAGNOSIS — I129 Hypertensive chronic kidney disease with stage 1 through stage 4 chronic kidney disease, or unspecified chronic kidney disease: Secondary | ICD-10-CM | POA: Diagnosis not present

## 2015-12-31 ENCOUNTER — Ambulatory Visit (INDEPENDENT_AMBULATORY_CARE_PROVIDER_SITE_OTHER): Payer: BLUE CROSS/BLUE SHIELD | Admitting: Pulmonary Disease

## 2015-12-31 ENCOUNTER — Other Ambulatory Visit (INDEPENDENT_AMBULATORY_CARE_PROVIDER_SITE_OTHER): Payer: BLUE CROSS/BLUE SHIELD

## 2015-12-31 ENCOUNTER — Telehealth: Payer: Self-pay | Admitting: Pulmonary Disease

## 2015-12-31 VITALS — BP 136/80 | HR 78 | Ht 65.0 in | Wt 237.0 lb

## 2015-12-31 DIAGNOSIS — R911 Solitary pulmonary nodule: Secondary | ICD-10-CM | POA: Diagnosis not present

## 2015-12-31 DIAGNOSIS — I8290 Acute embolism and thrombosis of unspecified vein: Secondary | ICD-10-CM

## 2015-12-31 DIAGNOSIS — I272 Pulmonary hypertension, unspecified: Secondary | ICD-10-CM

## 2015-12-31 LAB — HEPATIC FUNCTION PANEL
ALBUMIN: 3.6 g/dL (ref 3.5–5.2)
ALK PHOS: 91 U/L (ref 39–117)
ALT: 11 U/L (ref 0–35)
AST: 13 U/L (ref 0–37)
BILIRUBIN DIRECT: 0.2 mg/dL (ref 0.0–0.3)
TOTAL PROTEIN: 7.7 g/dL (ref 6.0–8.3)
Total Bilirubin: 1.5 mg/dL — ABNORMAL HIGH (ref 0.2–1.2)

## 2015-12-31 MED ORDER — RIVAROXABAN 20 MG PO TABS: 20.0000 mg | ORAL_TABLET | Freq: Every day | ORAL | 5 refills | Status: DC

## 2015-12-31 MED ORDER — RIVAROXABAN (XARELTO) VTE STARTER PACK (15 & 20 MG): ORAL_TABLET | ORAL | 0 refills | Status: DC

## 2015-12-31 NOTE — Telephone Encounter (Signed)
Please advise Dr Vaughan Browner on dosing of Xarelto and instructions.  This was not called in at [facility] today. Pt aware that Dr Vaughan Browner is gone for the night and we will send this to him and will call back tomorrow 01/01/16. Pt requests that this be called into [facility]  Please advise Dr Vaughan Browner. Thanks.

## 2015-12-31 NOTE — Patient Instructions (Signed)
We will check LFTs, HIV test, ANA, ANCA, RF and CCP Start xarelto anticoagulation  Please start using the CPAP on a regular basis We will make a referral to [facility] Dr. Christy Sartorius Test for further evaluation of your pulmonary HTN.

## 2015-12-31 NOTE — Progress Notes (Signed)
[name]    [id number]    December 26, 1965  Primary Care Physician:FULP, CAMMIE, MD  Referring Physician: Antony Blackbird, MD [address], [place] [address]  Chief complaint:  Follow up for dyspnea on exertion, cough.  HPI: [name] is a 50 year old with past medical history of DVT, PE in 2014, obstructive sleep apnea., Obesity, and deficiency anemia. She complains of dyspnea on exertion. His symptoms started after she was diagnosed with a DVT and PE in 2014. This was apparently unprovoked and hypercoaguable panel was negative she was given anticoagulation for about 6 months.  She also complains of chronic cough for about 6 months. Productive of whitish, greenish sputum. She denies any fevers or chills. She has chronic rhinitis and postnasal drip. She denies any history or symptoms of GERD. She was started on albuterol inhaler when necessary. She says that this does help with her dyspnea and her cough. She as given a flonase nasal spray last visit but did not help with symptoms. Eval by Cardiology Dr. Einar Gip noted with abnormal echo. She subsequently  undewent a RHC that showed severe pulmonary HTN with only mild elevation in wedge. She also had an intermediate probability V/Q scan. She feels better on supplemental O2.  She was diagnosed with OSA in 2015. She is on CPAP for a few months but then stopped because it was causing nasal congestion.  Outpatient Encounter Prescriptions as of 12/31/2015  Medication Sig  . acetaminophen (TYLENOL) 500 MG tablet Take 500 mg by mouth every 6 (six) hours as needed for moderate pain or headache.  . albuterol (PROVENTIL HFA;VENTOLIN HFA) 108 (90 Base) MCG/ACT inhaler Inhale 2 puffs into the lungs every 6 (six) hours as needed for wheezing or shortness of breath.  Marland Kitchen amLODipine (NORVASC) 5 MG tablet Take 1 tablet by mouth daily.  . Azelastine-Fluticasone 137-50 MCG/ACT SUSP Place 1 spray into the nose 2 (two) times daily.  .  cetirizine (ZYRTEC) 10 MG tablet Take 1 tablet (10 mg total) by mouth at bedtime.  . fluticasone (FLONASE) 50 MCG/ACT nasal spray Place 2 sprays into both nostrils daily.  . Fluticasone-Salmeterol (ADVAIR DISKUS) 250-50 MCG/DOSE AEPB Inhale 1 puff into the lungs 2 (two) times daily.  . furosemide (LASIX) 20 MG tablet Take 40 mg by mouth daily.   Marland Kitchen levocetirizine (XYZAL) 5 MG tablet Take 1 tablet by mouth daily as needed.  Marland Kitchen losartan (COZAAR) 100 MG tablet Take 1 tablet (100 mg total) by mouth daily.  . promethazine-codeine (PHENERGAN WITH CODEINE) 6.25-10 MG/5ML syrup Take 5 mLs by mouth every 4 (four) hours as needed.  . sodium chloride (OCEAN) 0.65 % SOLN nasal spray Place 1 spray into both nostrils as needed for congestion.  Marland Kitchen spironolactone (ALDACTONE) 50 MG tablet Take 1 tablet by mouth daily.  Marland Kitchen enoxaparin (LOVENOX) 100 MG/ML injection Inject 1 mL (100 mg total) into the skin every 12 (twelve) hours. (Patient not taking: Reported on 12/31/2015)   No facility-administered encounter medications on file as of 12/31/2015.     Allergies as of 12/31/2015 - Review Complete 12/24/2015  Allergen Reaction Noted  . Ampicillin Other (See Comments) 04/28/2011  . Amoxicillin Other (See Comments) 01/12/2014  . Sulfa antibiotics Other (See Comments) 04/28/2011    Past Medical History:  Diagnosis Date  . Anemia    iron deficiency  . Diabetes mellitus, type II ([place])   . DVT (deep venous thrombosis) ([place]) 2014   left leg  . HTN (hypertension)   .  Medical history non-contributory   . Menorrhagia    secondary to uterine fibroids  . OSA (obstructive sleep apnea)    07/25/13 HST AHI 33/hr, severe hypoxemia O2 min 42% and 95% of the time <89%  . PE (pulmonary embolism) 2014   bilateral  . Pulmonary nodule    (78m on loeft lower lobe) found on CT scan July 2014, repeat scan Jan 2015 showed less than 436m . S/P TAH (total abdominal hysterectomy) 06/07/2013    Past Surgical History:  Procedure  Laterality Date  . ABDOMINAL HYSTERECTOMY N/A 06/07/2013   Procedure: HYSTERECTOMY ABDOMINAL WITH BIALTERAL SALPINGECTOMY;  Surgeon: VaElveria RoyalsMD;  [facility];  Service: Gynecology;  Laterality: N/A;  . CARDIAC CATHETERIZATION N/A 12/24/2015   Procedure: Right Heart Cath;  Surgeon: JaAdrian ProwsMD;  [facility];  Service: Cardiovascular;  Laterality: N/A;  . CESAREAN SECTION    . POLYPECTOMY  2008   Removal of uterine polyp    Family History  Problem Relation Age of Onset  . Hypertension Mother   . Kidney disease Mother   . Hypertension Father   . Stroke Sister     Social History   Social History  . Marital status: Legally Separated    Spouse name: N/A  . Number of children: N/A  . Years of education: N/A   Occupational History  . Not on file.   Social History Main Topics  . Smoking status: Former Smoker    Packs/day: 0.33    Years: 10.00    Types: Cigarettes    Quit date: 10/04/2011  . Smokeless tobacco: Never Used  . Alcohol use 0.0 oz/week     Comment: social  . Drug use: No  . Sexual activity: Yes   Other Topics Concern  . 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  HENT: Negative.   Eyes: Negative for blurred vision.  Respiratory: as per HPI  Cardiovascular: Negative for chest pain and palpitations.  Gastrointestinal: Negative for vomiting, diarrhea, blood per rectum. Genitourinary: Negative for dysuria, urgency, frequency and hematuria.  Musculoskeletal: Negative for myalgias, back pain and joint pain.  Skin: Negative for itching and rash.  Neurological: Negative for dizziness, tremors, focal weakness, seizures and loss of consciousness.  Endo/Heme/Allergies: Negative for environmental allergies.  Psychiatric/Behavioral: Negative for depression,  suicidal ideas and hallucinations.  All other systems reviewed and are negative.   Physical Exam: Blood pressure 132/74, pulse 75, height _0  (1.651 m), weight 109.8 kg (242 lb), last menstrual period 05/11/2013, SpO2 93 %. Gen:      No acute distress HEENT:  EOMI, sclera anicteric Neck:     No masses; no thyromegaly Lungs:    Clear to auscultation bilaterally; normal respiratory effort CV:         Regular rate and rhythm; no murmurs Abd:      + bowel sounds; soft, non-tender; no palpable masses, no distension Ext:    No edema; adequate peripheral perfusion Skin:      Warm and dry; no rash Neuro: alert and oriented x 3 Psych: normal mood and affect  Data Reviewed: Chest x ray 11/18/15- No active cardiopulmonary disease. Images reviewed.  PFTs 08/07/14 FVC 2.32 (76%) FEV1 1.93 [70%) F/F 83 DLCO 62% Minimal obstructive lung disease, restriction  possible. Moderate reduction in diffusion capacity.  V/Q scan 12/25/15 Intermediate probability for PE  DATA from Dr. Einar Gip Echocardiogram 10/29/2015 1. Poor echo window due to bodily habitus. Left ventricle cavity is normal in size. Normal global wall motion. Doppler evidence of grade II (pseudonormal) diastolic dysfunction. Diastolic dysfunction findings suggests elevated LA/LV end diastolic pressure. Calculated EF 77%. 2. Right atrial cavity is moderately dilated. 3. Right ventricle cavity is moderately dilated. Moderately reduced right ventricular function. 4. Trace tricuspid regurgitation. Unable to estimate PA pressure due to absence/minimal TR signal. 4. Mild to moderate pulmonic regurgitation. 5. IVC is dilated with poor inspiration collapse consistent with elevated right atrial pressure.  Nuclear stress test 10/21/2015 1. Resting EKG demonstrated normal sinus rhythm, nonspecific T of rheumatic. Stress EKG is nondiagnostic for ischemia as except pharmacologic stress test. Stress symptoms included dizziness. Stress terminated due to  completion of for neurologic stress protocol. 2. The raw images reveal mild breast attenuation artifact in the inferior wall, patient scabnned sitting. Perfusion imaging study demonstrated a moderate-sized moderate to severe inferior wall perfusion abnormality which remains unchanged from rest and stress images, probably suggest prominent soft tissue attenuation. Left ventricular systolic function calculated at 53% without regional wall motion abnormality again suggesting the defect noted above is probably a soft tissue attenuation than a scar.This is a low risk study.  Right heart catheterization 12/24/2015: RA 20/16, mean 16 mmHg. RA saturation 56%. RV 78/9, EDP 20 mmHg. PA 81/36, mean 55 mmHg. PA saturation 58%. Aortic saturation 79%. PW 21/22, mean 18-20 mmHg. Fick cardiac output 5.79, cardiac index 2.69, preserved. QP/QS 1.0.  Impression: Findings are consistent with severe primary pulmonary hypertension. Mildly elevated PW mean is probably related to a component of diastolic dysfunction but clearly the PA pressures is out of proportion to minimally elevated PW mean pressure. Patient received 5 mg of intrapulmonary arterial verapamil and 1000 g of NTG without any significant change in pressure.  Assessment:  #1 Pulmonary hypertension. H/O PE She probably has chronic thromboembolic disease. RHC noted with severe elevation on PA pressures and V/Q scan with intermediate probability for PE. I will send a work up to check for other causes of pulm htn such as CTDs and check HIV and LFTs. She will be started on xarelto anticoagulation and referred to [facility] for further eval. Her PFTs are suggestive of mild restriction as shown by reduction in FVC, FEV1. There is a moderate reduction in DLCO which corrects for alveolar capacity.  She is continuing on advair and supplemental O2  #2 Chronic cough. This is secondary to her rhinitis and postnasal drip. She is doing better on zyrtec antihistamine and  dymista nasal spray.  #3 OSA She has significant OSA with significant desaturations to 42% in a sleep study done in 2015. Her poorly treated OSA might also be contributing to her symptoms and RV dysfunction. She is more compliant with CPAP for the past month. I have advised her to use the CPAP daily.  Plan/Recommendations: - Start xarelto anticoagulation. Refer to [facility], Dr Test for evaluation of thromboendarterectomy - Check ANA, RF,CCP, HIV and LFTs - Continue advair 250/50, zyrtec and dymista nasal spray - Strongly advised to be compliant with CPAP. - Continue supplemental O2.  Marshell Garfinkel MD [facility] Pager [phone] 12/31/2015, 12:55 PM  CC: Antony Blackbird, MD [name] MD

## 2016-01-01 LAB — ANA: Anti Nuclear Antibody(ANA): NEGATIVE

## 2016-01-01 LAB — RHEUMATOID FACTOR: Rhuematoid fact SerPl-aCnc: <14 IU/mL (ref <14)

## 2016-01-01 LAB — HIV ANTIBODY (ROUTINE TESTING W REFLEX): HIV: NONREACTIVE

## 2016-01-01 LAB — CYCLIC CITRUL PEPTIDE ANTIBODY, IGG

## 2016-01-01 MED ORDER — RIVAROXABAN 15 MG PO TABS: 15.0000 mg | ORAL_TABLET | Freq: Two times a day (BID) | ORAL | 0 refills | Status: DC

## 2016-01-01 MED ORDER — RIVAROXABAN 20 MG PO TABS: 20.0000 mg | ORAL_TABLET | Freq: Every day | ORAL | 5 refills | Status: DC

## 2016-01-01 NOTE — Telephone Encounter (Signed)
Rxs were sent to [facility] [address]  Pt aware Nothing further needed

## 2016-01-01 NOTE — Telephone Encounter (Signed)
Xarelto orders 15 mg twice daily with food for 21 days followed by 20 mg once daily with food.

## 2016-01-02 LAB — ANCA SCREEN W REFLEX TITER: ANCA SCREEN: NEGATIVE

## 2016-01-06 ENCOUNTER — Encounter: Payer: Self-pay | Admitting: Pulmonary Disease

## 2016-01-07 DIAGNOSIS — I10 Essential (primary) hypertension: Secondary | ICD-10-CM | POA: Diagnosis not present

## 2016-01-07 DIAGNOSIS — I5032 Chronic diastolic (congestive) heart failure: Secondary | ICD-10-CM | POA: Diagnosis not present

## 2016-01-07 DIAGNOSIS — R0602 Shortness of breath: Secondary | ICD-10-CM | POA: Diagnosis not present

## 2016-01-08 DIAGNOSIS — N189 Chronic kidney disease, unspecified: Secondary | ICD-10-CM | POA: Diagnosis not present

## 2016-01-08 DIAGNOSIS — D631 Anemia in chronic kidney disease: Secondary | ICD-10-CM | POA: Diagnosis not present

## 2016-01-08 DIAGNOSIS — R809 Proteinuria, unspecified: Secondary | ICD-10-CM | POA: Diagnosis not present

## 2016-01-08 DIAGNOSIS — I129 Hypertensive chronic kidney disease with stage 1 through stage 4 chronic kidney disease, or unspecified chronic kidney disease: Secondary | ICD-10-CM | POA: Diagnosis not present

## 2016-01-22 ENCOUNTER — Ambulatory Visit: Payer: Self-pay | Admitting: Pulmonary Disease

## 2016-01-22 DIAGNOSIS — G4733 Obstructive sleep apnea (adult) (pediatric): Secondary | ICD-10-CM | POA: Diagnosis not present

## 2016-01-29 DIAGNOSIS — H5213 Myopia, bilateral: Secondary | ICD-10-CM | POA: Diagnosis not present

## 2016-01-31 ENCOUNTER — Encounter: Payer: Self-pay | Admitting: Physician Assistant

## 2016-01-31 ENCOUNTER — Emergency Department: Payer: BLUE CROSS/BLUE SHIELD

## 2016-01-31 ENCOUNTER — Ambulatory Visit: Payer: Self-pay | Admitting: Physician Assistant

## 2016-01-31 ENCOUNTER — Inpatient Hospital Stay
Admission: EM | Admit: 2016-01-31 | Discharge: 2016-02-03 | DRG: 190 | Disposition: A | Discharge status: Home / Self Care | Payer: BLUE CROSS/BLUE SHIELD | Attending: Internal Medicine | Admitting: Internal Medicine

## 2016-01-31 ENCOUNTER — Inpatient Hospital Stay: Payer: BLUE CROSS/BLUE SHIELD

## 2016-01-31 VITALS — BP 110/70 | HR 82 | Resp 16

## 2016-01-31 DIAGNOSIS — R739 Hyperglycemia, unspecified: Secondary | ICD-10-CM | POA: Diagnosis not present

## 2016-01-31 DIAGNOSIS — G4730 Sleep apnea, unspecified: Secondary | ICD-10-CM | POA: Diagnosis not present

## 2016-01-31 DIAGNOSIS — Z90710 Acquired absence of both cervix and uterus: Secondary | ICD-10-CM

## 2016-01-31 DIAGNOSIS — Z7902 Long term (current) use of antithrombotics/antiplatelets: Secondary | ICD-10-CM | POA: Diagnosis not present

## 2016-01-31 DIAGNOSIS — Z881 Allergy status to other antibiotic agents status: Secondary | ICD-10-CM | POA: Diagnosis not present

## 2016-01-31 DIAGNOSIS — E119 Type 2 diabetes mellitus without complications: Secondary | ICD-10-CM | POA: Diagnosis not present

## 2016-01-31 DIAGNOSIS — Z8249 Family history of ischemic heart disease and other diseases of the circulatory system: Secondary | ICD-10-CM | POA: Diagnosis not present

## 2016-01-31 DIAGNOSIS — Z86711 Personal history of pulmonary embolism: Secondary | ICD-10-CM

## 2016-01-31 DIAGNOSIS — Z882 Allergy status to sulfonamides status: Secondary | ICD-10-CM

## 2016-01-31 DIAGNOSIS — E1165 Type 2 diabetes mellitus with hyperglycemia: Secondary | ICD-10-CM | POA: Diagnosis not present

## 2016-01-31 DIAGNOSIS — R079 Chest pain, unspecified: Secondary | ICD-10-CM | POA: Diagnosis not present

## 2016-01-31 DIAGNOSIS — J9620 Acute and chronic respiratory failure, unspecified whether with hypoxia or hypercapnia: Secondary | ICD-10-CM | POA: Diagnosis not present

## 2016-01-31 DIAGNOSIS — Z86718 Personal history of other venous thrombosis and embolism: Secondary | ICD-10-CM

## 2016-01-31 DIAGNOSIS — I159 Secondary hypertension, unspecified: Secondary | ICD-10-CM | POA: Diagnosis present

## 2016-01-31 DIAGNOSIS — Z7901 Long term (current) use of anticoagulants: Secondary | ICD-10-CM

## 2016-01-31 DIAGNOSIS — I272 Pulmonary hypertension, unspecified: Secondary | ICD-10-CM | POA: Diagnosis not present

## 2016-01-31 DIAGNOSIS — J9611 Chronic respiratory failure with hypoxia: Secondary | ICD-10-CM | POA: Diagnosis present

## 2016-01-31 DIAGNOSIS — R911 Solitary pulmonary nodule: Secondary | ICD-10-CM | POA: Diagnosis present

## 2016-01-31 DIAGNOSIS — R55 Syncope and collapse: Secondary | ICD-10-CM | POA: Diagnosis not present

## 2016-01-31 DIAGNOSIS — Z841 Family history of disorders of kidney and ureter: Secondary | ICD-10-CM

## 2016-01-31 DIAGNOSIS — R52 Pain, unspecified: Secondary | ICD-10-CM

## 2016-01-31 DIAGNOSIS — Z823 Family history of stroke: Secondary | ICD-10-CM

## 2016-01-31 DIAGNOSIS — Z9981 Dependence on supplemental oxygen: Secondary | ICD-10-CM | POA: Diagnosis not present

## 2016-01-31 DIAGNOSIS — J9621 Acute and chronic respiratory failure with hypoxia: Secondary | ICD-10-CM | POA: Diagnosis not present

## 2016-01-31 DIAGNOSIS — J441 Chronic obstructive pulmonary disease with (acute) exacerbation: Secondary | ICD-10-CM | POA: Diagnosis not present

## 2016-01-31 DIAGNOSIS — G4733 Obstructive sleep apnea (adult) (pediatric): Secondary | ICD-10-CM | POA: Diagnosis present

## 2016-01-31 DIAGNOSIS — Z87891 Personal history of nicotine dependence: Secondary | ICD-10-CM | POA: Diagnosis not present

## 2016-01-31 DIAGNOSIS — M79661 Pain in right lower leg: Secondary | ICD-10-CM | POA: Diagnosis not present

## 2016-01-31 DIAGNOSIS — R0603 Acute respiratory distress: Secondary | ICD-10-CM

## 2016-01-31 DIAGNOSIS — Z79899 Other long term (current) drug therapy: Secondary | ICD-10-CM

## 2016-01-31 DIAGNOSIS — I82409 Acute embolism and thrombosis of unspecified deep veins of unspecified lower extremity: Secondary | ICD-10-CM | POA: Diagnosis not present

## 2016-01-31 DIAGNOSIS — R0602 Shortness of breath: Secondary | ICD-10-CM | POA: Diagnosis not present

## 2016-01-31 DIAGNOSIS — Z7951 Long term (current) use of inhaled steroids: Secondary | ICD-10-CM | POA: Diagnosis not present

## 2016-01-31 DIAGNOSIS — I509 Heart failure, unspecified: Secondary | ICD-10-CM | POA: Diagnosis not present

## 2016-01-31 LAB — BASIC METABOLIC PANEL
Anion gap: 7 (ref 5–15)
BUN: 10 mg/dL (ref 6–20)
CALCIUM: 8.7 mg/dL — AB (ref 8.9–10.3)
CHLORIDE: 101 mmol/L (ref 101–111)
CO2: 31 mmol/L (ref 22–32)
CREATININE: 0.76 mg/dL (ref 0.44–1.00)
GFR calc non Af Amer: >60 mL/min (ref >60)
GLUCOSE: 280 mg/dL — AB (ref 65–99)
Potassium: 3.1 mmol/L — ABNORMAL LOW (ref 3.5–5.1)
Sodium: 139 mmol/L (ref 135–145)

## 2016-01-31 LAB — CBC WITH DIFFERENTIAL/PLATELET
BASOS PCT: 1 %
Basophils Absolute: 0 10*3/uL (ref 0–0.1)
EOS ABS: 0.1 10*3/uL (ref 0–0.7)
Eosinophils Relative: 1 %
HCT: 48.2 % — ABNORMAL HIGH (ref 35.0–47.0)
HEMOGLOBIN: 16.6 g/dL — AB (ref 12.0–16.0)
LYMPHS ABS: 1.1 10*3/uL (ref 1.0–3.6)
Lymphocytes Relative: 19 %
MCH: 33.6 pg (ref 26.0–34.0)
MCHC: 34.4 g/dL (ref 32.0–36.0)
MCV: 97.9 fL (ref 80.0–100.0)
MONO ABS: 0.4 10*3/uL (ref 0.2–0.9)
MONOS PCT: 7 %
NEUTROS PCT: 72 %
Neutro Abs: 3.9 10*3/uL (ref 1.4–6.5)
Platelets: 162 10*3/uL (ref 150–440)
RBC: 4.93 MIL/uL (ref 3.80–5.20)
RDW: 14.2 % (ref 11.5–14.5)
WBC: 5.5 10*3/uL (ref 3.6–11.0)

## 2016-01-31 LAB — TROPONIN I

## 2016-01-31 MED ORDER — DOCUSATE SODIUM 100 MG PO CAPS
100.0000 mg | ORAL_CAPSULE | Freq: Two times a day (BID) | ORAL | Status: DC
  Administered 2016-01-31 – 2016-02-03 (×6): 100 mg via ORAL
  Filled 2016-01-31 (×6): qty 1

## 2016-01-31 MED ORDER — AMLODIPINE BESYLATE 5 MG PO TABS
5.0000 mg | ORAL_TABLET | Freq: Every day | ORAL | Status: DC
  Administered 2016-02-01 – 2016-02-03 (×3): 5 mg via ORAL
  Filled 2016-01-31 (×3): qty 1

## 2016-01-31 MED ORDER — METHYLPREDNISOLONE SODIUM SUCC 125 MG IJ SOLR
125.0000 mg | Freq: Once | INTRAMUSCULAR | Status: AC
Start: 2016-01-31 — End: 2016-01-31
  Administered 2016-01-31: 125 mg via INTRAVENOUS
  Filled 2016-01-31: qty 2

## 2016-01-31 MED ORDER — MOMETASONE FURO-FORMOTEROL FUM 200-5 MCG/ACT IN AERO
2.0000 | INHALATION_SPRAY | Freq: Two times a day (BID) | RESPIRATORY_TRACT | Status: DC
  Administered 2016-01-31 – 2016-02-03 (×6): 2 via RESPIRATORY_TRACT
  Filled 2016-01-31: qty 8.8

## 2016-01-31 MED ORDER — RIVAROXABAN 15 MG PO TABS
15.0000 mg | ORAL_TABLET | Freq: Two times a day (BID) | ORAL | Status: DC
  Administered 2016-01-31 – 2016-02-03 (×6): 15 mg via ORAL
  Filled 2016-01-31 (×7): qty 1

## 2016-01-31 MED ORDER — TRAZODONE HCL 50 MG PO TABS: 25.0000 mg | ORAL_TABLET | Freq: Every evening | ORAL | Status: DC | PRN

## 2016-01-31 MED ORDER — METHYLPREDNISOLONE SODIUM SUCC 125 MG IJ SOLR: 60.0000 mg | INTRAMUSCULAR | Status: DC

## 2016-01-31 MED ORDER — METHYLPREDNISOLONE SODIUM SUCC 125 MG IJ SOLR: 48.0000 mg | Freq: Once | INTRAMUSCULAR | Status: DC

## 2016-01-31 MED ORDER — BISACODYL 5 MG PO TBEC
5.0000 mg | DELAYED_RELEASE_TABLET | Freq: Every day | ORAL | Status: DC | PRN
  Filled 2016-01-31: qty 1

## 2016-01-31 MED ORDER — AZELASTINE HCL 0.1 % NA SOLN
2.0000 | Freq: Two times a day (BID) | NASAL | Status: DC
  Administered 2016-01-31 – 2016-02-03 (×3): 2 via NASAL
  Filled 2016-01-31: qty 30

## 2016-01-31 MED ORDER — LORATADINE 10 MG PO TABS
10.0000 mg | ORAL_TABLET | Freq: Every day | ORAL | Status: DC
  Administered 2016-02-01 – 2016-02-03 (×3): 10 mg via ORAL
  Filled 2016-01-31 (×3): qty 1

## 2016-01-31 MED ORDER — HYDROCODONE-ACETAMINOPHEN 5-325 MG PO TABS: 1.0000 | ORAL_TABLET | ORAL | Status: DC | PRN

## 2016-01-31 MED ORDER — AZELASTINE-FLUTICASONE 137-50 MCG/ACT NA SUSP: 1.0000 | Freq: Two times a day (BID) | NASAL | Status: DC

## 2016-01-31 MED ORDER — SPIRONOLACTONE 25 MG PO TABS
50.0000 mg | ORAL_TABLET | Freq: Every day | ORAL | Status: DC
  Administered 2016-02-01 – 2016-02-03 (×3): 50 mg via ORAL
  Filled 2016-01-31 (×2): qty 2
  Filled 2016-01-31: qty 1
  Filled 2016-01-31: qty 2

## 2016-01-31 MED ORDER — FUROSEMIDE 40 MG PO TABS
40.0000 mg | ORAL_TABLET | Freq: Every day | ORAL | Status: DC
  Administered 2016-02-01 – 2016-02-03 (×3): 40 mg via ORAL
  Filled 2016-01-31 (×3): qty 1

## 2016-01-31 MED ORDER — IOPAMIDOL (ISOVUE-370) INJECTION 76%
75.0000 mL | Freq: Once | INTRAVENOUS | Status: AC | PRN
  Administered 2016-01-31: 75 mL via INTRAVENOUS

## 2016-01-31 MED ORDER — RIVAROXABAN 20 MG PO TABS: 20.0000 mg | ORAL_TABLET | Freq: Every day | ORAL | Status: DC

## 2016-01-31 MED ORDER — FLUTICASONE PROPIONATE 50 MCG/ACT NA SUSP
2.0000 | Freq: Every day | NASAL | Status: DC
  Administered 2016-02-01 – 2016-02-03 (×3): 2 via NASAL
  Filled 2016-01-31: qty 16

## 2016-01-31 MED ORDER — ACETAMINOPHEN 650 MG RE SUPP: 650.0000 mg | Freq: Four times a day (QID) | RECTAL | Status: DC | PRN

## 2016-01-31 MED ORDER — METOPROLOL TARTRATE 25 MG PO TABS
25.0000 mg | ORAL_TABLET | Freq: Two times a day (BID) | ORAL | Status: DC
  Administered 2016-01-31 – 2016-02-03 (×6): 25 mg via ORAL
  Filled 2016-01-31 (×6): qty 1

## 2016-01-31 MED ORDER — METHYLPREDNISOLONE SODIUM SUCC 125 MG IJ SOLR
60.0000 mg | Freq: Two times a day (BID) | INTRAMUSCULAR | Status: DC
  Administered 2016-02-01: 125 mg via INTRAVENOUS
  Filled 2016-01-31: qty 2

## 2016-01-31 MED ORDER — ACETAMINOPHEN 325 MG PO TABS
650.0000 mg | ORAL_TABLET | Freq: Four times a day (QID) | ORAL | Status: DC | PRN
  Administered 2016-01-31: 650 mg via ORAL
  Filled 2016-01-31: qty 2

## 2016-01-31 MED ORDER — IPRATROPIUM-ALBUTEROL 0.5-2.5 (3) MG/3ML IN SOLN
3.0000 mL | Freq: Four times a day (QID) | RESPIRATORY_TRACT | Status: DC
  Administered 2016-01-31 – 2016-02-03 (×11): 3 mL via RESPIRATORY_TRACT
  Filled 2016-01-31 (×12): qty 3

## 2016-01-31 MED ORDER — LOSARTAN POTASSIUM 50 MG PO TABS
100.0000 mg | ORAL_TABLET | Freq: Every day | ORAL | Status: DC
  Administered 2016-02-01 – 2016-02-03 (×3): 100 mg via ORAL
  Filled 2016-01-31 (×3): qty 2

## 2016-01-31 MED ORDER — ALBUTEROL SULFATE (2.5 MG/3ML) 0.083% IN NEBU
5.0000 mg | INHALATION_SOLUTION | Freq: Once | RESPIRATORY_TRACT | Status: AC
  Administered 2016-01-31: 5 mg via RESPIRATORY_TRACT
  Filled 2016-01-31: qty 6

## 2016-01-31 MED ORDER — RIVAROXABAN 15 MG PO TABS: 15.0000 mg | ORAL_TABLET | Freq: Two times a day (BID) | ORAL | Status: DC

## 2016-01-31 MED ORDER — ONDANSETRON HCL 4 MG/2ML IJ SOLN: 4.0000 mg | Freq: Four times a day (QID) | INTRAMUSCULAR | Status: DC | PRN

## 2016-01-31 MED ORDER — IPRATROPIUM-ALBUTEROL 0.5-2.5 (3) MG/3ML IN SOLN
3.0000 mL | Freq: Once | RESPIRATORY_TRACT | Status: AC
  Administered 2016-01-31: 3 mL via RESPIRATORY_TRACT
  Filled 2016-01-31: qty 3

## 2016-01-31 MED ORDER — ONDANSETRON HCL 4 MG PO TABS: 4.0000 mg | ORAL_TABLET | Freq: Four times a day (QID) | ORAL | Status: DC | PRN

## 2016-01-31 NOTE — ED Notes (Signed)
Pt resting in bed, given breathing tx, pt speaking in full clear sentences, pt on 4L [place]

## 2016-01-31 NOTE — ED Notes (Signed)
Pt resting in bed , friend at bedside, pt awake and alert in no acute distress

## 2016-01-31 NOTE — ED Notes (Signed)
Patient transported to Ultrasound 

## 2016-01-31 NOTE — H&P (Signed)
[facility] [place] NAME: [name]    MR#:  [id number]  DATE OF BIRTH:  1965/04/01  DATE OF ADMISSION:  01/31/2016  PRIMARY CARE PHYSICIAN: Antony Blackbird, MD   REQUESTING/REFERRING PHYSICIAN: Dr. Delman Kitten  CHIEF COMPLAINT: Shortness of breath    Chief Complaint  Patient presents with  . Shortness of Breath  . Low SpO2    HISTORY OF PRESENT ILLNESS:  [name]  is a 50 y.o. female with a known history of Sleep apnea, COPD, on oxygen for the past 1 month comes in because of shortness of breath, wheezing. Started to have shortness of breath and wheezing since Monday got progressively worse, patient to O2 saturations 83% on room air, she also complains of fatigue. Patient uses 2 L of oxygen all the time, but recently she is been using more oxygen on exertion, now she is on 5 L of oxygen saturation 94%. Patient saw the primary doctor a month ago for bronchitis that time she started on oxygen,  Zpack was given, inhaler was given.  . Patient also has sleep apnea, uses CPAP at night. Patient had history of PE, patient was on anticoagulants for 6 months patient had PE due to left leg DVT, patient had a history of PE, DVT in 2014. Has seen pulmonary [facility], had a right heart catheterization a month ago at [facility], it showed severe pulmonary hypertension with only mild elevation of major pressure, she also had a VQ scan which showed intermediate probability for V/Q, started on Xarelto again in October 3. Patient referred to [facility] with Dr. Christy Sartorius for further evaluation of her pulmonary hypertension, and she has appointment coming up on  Nov 6th. PAST MEDICAL HISTORY:   Past Medical History:  Diagnosis Date  . Anemia    iron deficiency  . Diabetes mellitus, type II ([place])   . DVT (deep venous thrombosis) ([place]) 2014   left leg  . HTN (hypertension)   . Medical history non-contributory   . Menorrhagia    secondary to uterine  fibroids  . OSA (obstructive sleep apnea)    07/25/13 HST AHI 33/hr, severe hypoxemia O2 min 42% and 95% of the time <89%  . PE (pulmonary embolism) 2014   bilateral  . Pulmonary nodule    (99m on loeft lower lobe) found on CT scan July 2014, repeat scan Jan 2015 showed less than 454m . S/P TAH (total abdominal hysterectomy) 06/07/2013    PAST SURGICAL HISTOIRY:   Past Surgical History:  Procedure Laterality Date  . ABDOMINAL HYSTERECTOMY N/A 06/07/2013   Procedure: HYSTERECTOMY ABDOMINAL WITH BIALTERAL SALPINGECTOMY;  Surgeon: VaElveria RoyalsMD;  [facility];  Service: Gynecology;  Laterality: N/A;  . CARDIAC CATHETERIZATION N/A 12/24/2015   Procedure: Right Heart Cath;  Surgeon: JaAdrian ProwsMD;  [facility];  Service: Cardiovascular;  Laterality: N/A;  . CESAREAN SECTION    . POLYPECTOMY  2008   Removal of uterine polyp    SOCIAL HISTORY:   Social History  Substance Use Topics  . Smoking status: Former Smoker    Packs/day: 0.33    Years: 10.00    Types: Cigarettes    Quit date: 10/04/2011  . Smokeless tobacco: Never Used  . Alcohol use 0.0 oz/week     Comment: social    FAMILY HISTORY:   Family History  Problem Relation Age of Onset  . Hypertension Mother   . Kidney disease Mother   .  Hypertension Father   . Stroke Sister     DRUG ALLERGIES:   Allergies  Allergen Reactions  . Ampicillin Other (See Comments)    Severe abdominal pain, dizziness (penicillin is okay)  . Amoxicillin Other (See Comments)    Has patient had a PCN reaction causing immediate rash, facial/tongue/throat swelling, SOB or lightheadedness with hypotension: No Has patient had a PCN reaction causing severe rash involving mucus membranes or skin necrosis: No Has patient had a PCN reaction that required hospitalization No Has patient had a PCN reaction occurring within the last 10 years: No If all of the above answers are "NO", then may proceed with Cephalosporin use.  .  Sulfa Antibiotics Other (See Comments)    Very bad yeast infection    REVIEW OF SYSTEMS:  CONSTITUTIONAL: No fever, fatigue or weakness.  EYES: No blurred or double vision.  EARS, NOSE, AND THROAT: No tinnitus or ear pain.  RESPIRATORY: Cough, shortness of breath, wheezing.  CARDIOVASCULAR: No chest pain, orthopnea, edema.  GASTROINTESTINAL: No nausea, vomiting, diarrhea or abdominal pain.  GENITOURINARY: No dysuria, hematuria.  ENDOCRINE: No polyuria, nocturia,  HEMATOLOGY: No anemia, easy bruising or bleeding SKIN: No rash or lesion. MUSCULOSKELETAL: No joint pain or arthritis.   NEUROLOGIC: No tingling, numbness, weakness.  PSYCHIATRY: No anxiety or depression.   MEDICATIONS AT HOME:   Prior to Admission medications   Medication Sig Start Date End Date Taking? Authorizing Provider  acetaminophen (TYLENOL) 500 MG tablet Take 500 mg by mouth every 6 (six) hours as needed for moderate pain or headache.   Yes Historical Provider, MD  albuterol (PROVENTIL HFA;VENTOLIN HFA) 108 (90 Base) MCG/ACT inhaler Inhale 2 puffs into the lungs every 6 (six) hours as needed for wheezing or shortness of breath. 06/06/15  Yes Versie Starks, PA-C  amLODipine (NORVASC) 5 MG tablet Take 1 tablet by mouth daily. 09/30/15  Yes Historical Provider, MD  Azelastine-Fluticasone 137-50 MCG/ACT SUSP Place 1 spray into the nose 2 (two) times daily. 12/19/15  Yes Praveen Mannam, MD  cetirizine (ZYRTEC) 10 MG tablet Take 1 tablet (10 mg total) by mouth at bedtime. Patient taking differently: Take 10 mg by mouth daily.  12/19/15  Yes Praveen Mannam, MD  fluticasone (FLONASE) 50 MCG/ACT nasal spray Place 2 sprays into both nostrils daily. Patient taking differently: Place 2 sprays into both nostrils daily as needed for allergies.  01/15/15  Yes Praveen Mannam, MD  Fluticasone-Salmeterol (ADVAIR DISKUS) 250-50 MCG/DOSE AEPB Inhale 1 puff into the lungs 2 (two) times daily. 12/19/15  Yes Praveen Mannam, MD  furosemide  (LASIX) 20 MG tablet Take 40 mg by mouth daily.    Yes Historical Provider, MD  losartan (COZAAR) 100 MG tablet Take 1 tablet (100 mg total) by mouth daily. 03/29/15  Yes Tommie Homero Fellers, NP  promethazine-codeine (PHENERGAN WITH CODEINE) 6.25-10 MG/5ML syrup Take 5 mLs by mouth every 4 (four) hours as needed for cough.    Yes Historical Provider, MD  Rivaroxaban (XARELTO) 15 MG TABS tablet Take 1 tablet (15 mg total) by mouth 2 (two) times daily with a meal. 01/01/16  Yes Praveen Mannam, MD  rivaroxaban (XARELTO) 20 MG TABS tablet Take 1 tablet (20 mg total) by mouth daily with supper. Patient taking differently: Take 20 mg by mouth daily with supper. Start on day 22 01/01/16  Yes Praveen Mannam, MD  spironolactone (ALDACTONE) 50 MG tablet Take 1 tablet by mouth daily. 10/11/15  Yes Historical Provider, MD  enoxaparin (LOVENOX) 100 MG/ML injection Inject  1 mL (100 mg total) into the skin every 12 (twelve) hours. Patient not taking: Reported on 12/31/2015 12/26/15   Marshell Garfinkel, MD      VITAL SIGNS:  Blood pressure (!) 125/92, pulse (!) 104, temperature 98.3 F (36.8 C), temperature source Oral, resp. rate 13, height _0  (1.651 m), weight 108 kg (238 lb), last menstrual period 05/11/2013, SpO2 94 %.  PHYSICAL EXAMINATION:  GENERAL:  50 y.o.-year-old patient lying in the bed with no acute distress.  EYES: Pupils equal, round, reactive to light and accommodation. No scleral icterus. Extraocular muscles intact.  HEENT: Head atraumatic, normocephalic. Oropharynx and nasopharynx clear.  NECK:  Supple, no jugular venous distention. No thyroid enlargement, no tenderness.  LUNGS: Normal breath sounds bilaterally, no wheezing, rales,rhonchi or crepitation. No use of accessory muscles of respiration.  CARDIOVASCULAR: S1, S2 normal. No murmurs, rubs, or gallops.  ABDOMEN: Soft, nontender, nondistended. Bowel sounds present. No organomegaly or mass.  EXTREMITIES: No pedal edema, cyanosis, or clubbing.   NEUROLOGIC: Cranial nerves II through XII are intact. Muscle strength 5/5 in all extremities. Sensation intact. Gait not checked.  PSYCHIATRIC: The patient is alert and oriented x 3.  SKIN: No obvious rash, lesion, or ulcer.   LABORATORY PANEL:   CBC  Recent Labs Lab 01/31/16 1425  WBC 5.5  HGB 16.6*  HCT 48.2*  PLT 162   ------------------------------------------------------------------------------------------------------------------  Chemistries   Recent Labs Lab 01/31/16 1425  NA 139  K 3.1*  CL 101  CO2 31  GLUCOSE 280*  BUN 10  CREATININE 0.76  CALCIUM 8.7*   ------------------------------------------------------------------------------------------------------------------  Cardiac Enzymes  Recent Labs Lab 01/31/16 1425  TROPONINI <0.03   ------------------------------------------------------------------------------------------------------------------  RADIOLOGY:  Ct Angio Chest Pe W And/or Wo Contrast  Result Date: 01/31/2016 CLINICAL DATA:  Chronic home oxygen has had lower oxygen saturations. Intermittent central non radiating chest pain EXAM: CT ANGIOGRAPHY CHEST WITH CONTRAST TECHNIQUE: Multidetector CT imaging of the chest was performed using the standard protocol during bolus administration of intravenous contrast. Multiplanar CT image reconstructions and MIPs were obtained to evaluate the vascular anatomy. CONTRAST:  75 mL Isovue 370 intravenous COMPARISON:  None. FINDINGS: Cardiovascular: No filling defect within the central or segmental pulmonary arteries to suggest an acute embolus. There is marked enlargement of the pulmonary artery trunk, measuring 5 cm in transverse dimension. Main pulmonary artery is are also enlarged. This suggests pulmonary artery hypertension. There is ectasia of the ascending aorta measuring up to 4 cm. No dissection is seen. Heart size is mildly enlarged with asymmetric enlargement of the right atrium and right ventricle. No  pericardial effusion. Mediastinum/Nodes: There are prominent right hilar lymph nodes measuring up to 1.6 cm. Nonspecific sub cm mediastinal nodes. No axillary adenopathy. Imaged thyroid gland is normal. Trachea and mainstem bronchi unremarkable. Esophagus within normal limits. Lungs/Pleura: No acute consolidation or effusion. Patchy areas of hazy attenuation could relate to atelectasis versus small airways disease. There is a 4 mm left lower Upper Abdomen: No acute abnormalities in the upper abdomen. Musculoskeletal: No chest wall abnormality. No acute or significant osseous findings. Review of the MIP images confirms the above findings. IMPRESSION: 1. No CT evidence for acute pulmonary embolus.  No dissection. 2. Significantly enlarged pulmonary artery trunk and main pulmonary artery is suggesting pulmonary artery hypertension. Asymmetric enlargement of the right atrium and right ventricle suggests elevated right heart pressures. 3. Ectasia of the ascending aorta up to 4 cm. Recommend annual imaging followup by CTA or MRA. This recommendation follows 2010  ACCF/AHA/AATS/ACR/ASA/SCA/SCAI/SIR/STS/SVM Guidelines for the Diagnosis and Management of Patients with Thoracic Aortic Disease. Circulation. 2010; 121: e266-e369 4. 4 mm left lower lobe pulmonary nodule. No follow-up needed if patient is low-risk. Non-contrast chest CT can be considered in 12 months if patient is high-risk. This recommendation follows the consensus statement: Guidelines for Management of Incidental Pulmonary Nodules Detected on CT Images: From the Fleischner Society 2017; Radiology 2017; 284:228-243. lobe subpleural nodule, series 6, image number 46. Electronically Signed   By: Donavan Foil M.D.   On: 01/31/2016 16:10    EKG:   Orders placed or performed during the hospital encounter of 01/31/16  . EKG 12-Lead  . EKG 12-Lead   Normal sinus rhythm with signed 97 bpm, T-wave inversions in lead V4, V5, V6 IMPRESSION AND PLAN:   #1 acute  on chronic respiratory failure secondary to COPD exacerbation:admit start iv steroids,nebs,on oxygen.continue 4. 2.severe pulmonary htn;follows up with Dr. Christy Sartorius 3.H/o PE ,,intermediate probability for PE;restarted on  Xarelto.check leg sono for  Dvt. 4. Osa;cpap;at night Patient has T-wave inversions in the lateral leads; right heart catheterization on September 26 showed severe pulmonary hypertension. We will get echocardiogram, patient is chest pain patient in Cardiology consult. But I think she has EKG changes probably secondary to severe pulmonary hypertension; and already on Xarelto, amlodipine.  All the records are reviewed and case discussed with ED provider. Management plans discussed with the patient, family and they are in agreement.  CODE STATUS: full  TOTAL TIME TAKING CARE OF THIS PATIENT: 8mnutes.    KEpifanio LeschesM.D on 01/31/2016 at 6:26 PM  Between 7am to 6pm - Pager - [phone]  After 6pm go to [email or web address] - password [email or web address]  Office  [phone] CC: Primary care physician; FAntony Blackbird MD  Note: This dictation was prepared with Dragon dictation along with smaller phrase technology. Any transcriptional errors that result from this process are unintentional.

## 2016-01-31 NOTE — ED Provider Notes (Signed)
[facility] Emergency Department Provider Note  ____________________________________________  Time seen: Approximately 2:57 PM  I have reviewed the triage vital signs and the nursing notes.   HISTORY  Chief Complaint Shortness of Breath and Low SpO2    HPI [name] is a 50 y.o. female on chronic home oxygen 2-4 L who reports that over the past week her oxygen saturation is been in the mid 47s. It is never been in the 70s before. She's not sure why she has lung disease. She also reports some intermittent chest pain that is central, nonradiating, worse with breathing. She has a history of PE but was restarted on Xarelto about 2 weeks ago. She has a follow-up with [facility] pulmonology in 3 days on Monday. She has worsen dyspnea with exertion as usual, but no exertional chest pain. No syncope. No fevers or chills.   She has had runny nose and productive cough for the past several days. She started taking a Z-Pak that her doctor had prescribed her and has taken it for 4 days now with some improvement.    Past Medical History:  Diagnosis Date  . Anemia    iron deficiency  . Diabetes mellitus, type II ([place])   . DVT (deep venous thrombosis) ([place]) 2014   left leg  . HTN (hypertension)   . Medical history non-contributory   . Menorrhagia    secondary to uterine fibroids  . OSA (obstructive sleep apnea)    07/25/13 HST AHI 33/hr, severe hypoxemia O2 min 42% and 95% of the time <89%  . PE (pulmonary embolism) 2014   bilateral  . Pulmonary nodule    (18m on loeft lower lobe) found on CT scan July 2014, repeat scan Jan 2015 showed less than 431m . S/P TAH (total abdominal hysterectomy) 06/07/2013     Patient Active Problem List   Diagnosis Date Noted  . Dyspnea on exertion 12/09/2015  . S/P TAH (total abdominal hysterectomy) 06/07/2013  . Acute blood loss anemia 05/14/2013  . Abnormal uterine bleeding 10/13/2012  . Fibroids 10/13/2012  . Anemia 10/13/2012   . VTE (venous thromboembolism) 10/04/2012  . Pulmonary nodule 10/04/2012  . Hypokalemia 10/04/2012     Past Surgical History:  Procedure Laterality Date  . ABDOMINAL HYSTERECTOMY N/A 06/07/2013   Procedure: HYSTERECTOMY ABDOMINAL WITH BIALTERAL SALPINGECTOMY;  Surgeon: VaElveria RoyalsMD;  [facility];  Service: Gynecology;  Laterality: N/A;  . CARDIAC CATHETERIZATION N/A 12/24/2015   Procedure: Right Heart Cath;  Surgeon: JaAdrian ProwsMD;  [facility];  Service: Cardiovascular;  Laterality: N/A;  . CESAREAN SECTION    . POLYPECTOMY  2008   Removal of uterine polyp     Prior to Admission medications   Medication Sig Start Date End Date Taking? Authorizing Provider  acetaminophen (TYLENOL) 500 MG tablet Take 500 mg by mouth every 6 (six) hours as needed for moderate pain or headache.   Yes Historical Provider, MD  albuterol (PROVENTIL HFA;VENTOLIN HFA) 108 (90 Base) MCG/ACT inhaler Inhale 2 puffs into the lungs every 6 (six) hours as needed for wheezing or shortness of breath. 06/06/15  Yes SuVersie StarksPA-C  amLODipine (NORVASC) 5 MG tablet Take 1 tablet by mouth daily. 09/30/15  Yes Historical Provider, MD  Azelastine-Fluticasone 137-50 MCG/ACT SUSP Place 1 spray into the nose 2 (two) times daily. 12/19/15  Yes Praveen Mannam, MD  cetirizine (ZYRTEC) 10 MG tablet Take 1 tablet (10 mg total) by mouth at bedtime. Patient  taking differently: Take 10 mg by mouth daily.  12/19/15  Yes Praveen Mannam, MD  fluticasone (FLONASE) 50 MCG/ACT nasal spray Place 2 sprays into both nostrils daily. Patient taking differently: Place 2 sprays into both nostrils daily as needed for allergies.  01/15/15  Yes Praveen Mannam, MD  Fluticasone-Salmeterol (ADVAIR DISKUS) 250-50 MCG/DOSE AEPB Inhale 1 puff into the lungs 2 (two) times daily. 12/19/15  Yes Praveen Mannam, MD  furosemide (LASIX) 20 MG tablet Take 40 mg by mouth daily.    Yes Historical Provider, MD  losartan (COZAAR) 100 MG  tablet Take 1 tablet (100 mg total) by mouth daily. 03/29/15  Yes Tommie Homero Fellers, NP  promethazine-codeine (PHENERGAN WITH CODEINE) 6.25-10 MG/5ML syrup Take 5 mLs by mouth every 4 (four) hours as needed for cough.    Yes Historical Provider, MD  Rivaroxaban (XARELTO) 15 MG TABS tablet Take 1 tablet (15 mg total) by mouth 2 (two) times daily with a meal. 01/01/16  Yes Praveen Mannam, MD  rivaroxaban (XARELTO) 20 MG TABS tablet Take 1 tablet (20 mg total) by mouth daily with supper. Patient taking differently: Take 20 mg by mouth daily with supper. Start on day 22 01/01/16  Yes Praveen Mannam, MD  spironolactone (ALDACTONE) 50 MG tablet Take 1 tablet by mouth daily. 10/11/15  Yes Historical Provider, MD  enoxaparin (LOVENOX) 100 MG/ML injection Inject 1 mL (100 mg total) into the skin every 12 (twelve) hours. Patient not taking: Reported on 12/31/2015 12/26/15   Marshell Garfinkel, MD     Allergies Ampicillin; Amoxicillin; and Sulfa antibiotics   Family History  Problem Relation Age of Onset  . Hypertension Mother   . Kidney disease Mother   . Hypertension Father   . Stroke Sister     Social History Social History  Substance Use Topics  . Smoking status: Former Smoker    Packs/day: 0.33    Years: 10.00    Types: Cigarettes    Quit date: 10/04/2011  . Smokeless tobacco: Never Used  . Alcohol use 0.0 oz/week     Comment: social    Review of Systems  Constitutional:   No fever or chills.  ENT:   Positive rhinorrhea. Cardiovascular:   Positive as above head chest pain. Respiratory:  Positive shortness of breath and productive cough. Gastrointestinal:   Negative for abdominal pain, vomiting and diarrhea.  10-point ROS otherwise negative.  ____________________________________________   PHYSICAL EXAM:  VITAL SIGNS: ED Triage Vitals  Enc Vitals Group     BP 01/31/16 1406 130/86     Pulse Rate 01/31/16 1406 100     Resp 01/31/16 1406 20     Temp 01/31/16 1406 98.3 F (36.8 C)      Temp Source 01/31/16 1406 Oral     SpO2 01/31/16 1406 93 %     Weight 01/31/16 1411 238 lb (108 kg)     Height 01/31/16 1411 _0  (1.651 m)     Head Circumference --      Peak Flow --      Pain Score 01/31/16 1411 5     Pain Loc --      Pain Edu? --      Excl. in [place]? --     Vital signs reviewed, nursing assessments reviewed.   Constitutional:   Alert and oriented. Well appearing and in no distress. Eyes:   No scleral icterus. No conjunctival pallor. PERRL. EOMI.  No nystagmus. ENT   Head:   Normocephalic and atraumatic.  Nose:   No congestion/rhinnorhea. No septal hematoma   Mouth/Throat:   MMM, no pharyngeal erythema. No peritonsillar mass.    Neck:   No stridor. No SubQ emphysema. No meningismus. Hematological/Lymphatic/Immunilogical:   No cervical lymphadenopathy. Cardiovascular:   Tachycardia heart rate 100. Symmetric bilateral radial and DP pulses.  No murmurs.  Respiratory:   Normal respiratory effort without tachypnea nor retractions. Normal expiratory phase. Diffuse expiratory wheezing, left greater than right  Gastrointestinal:   Soft and nontender. Non distended. There is no CVA tenderness.  No rebound, rigidity, or guarding. Genitourinary:   deferred Musculoskeletal:   Nontender with normal range of motion in all extremities. No joint effusions.  No lower extremity tenderness.  No edema. Neurologic:   Normal speech and language.  CN 2-10 normal. Motor grossly intact. No gross focal neurologic deficits are appreciated.  Skin:    Skin is warm, dry and intact. No rash noted.  No petechiae, purpura, or bullae.  ____________________________________________    LABS (pertinent positives/negatives) (all labs ordered are listed, but only abnormal results are displayed) Labs Reviewed  BASIC METABOLIC PANEL  CBC WITH DIFFERENTIAL/PLATELET  TROPONIN I   ____________________________________________   EKG  Interpreted by me Sinus rhythm rate of 97,  right axis, normal intervals. Normal QRS. Normal ST segments. T wave inversions in 3 and aVF which are old compared to previous EKG. T wave inversions in V3 through V6 which are new compared to previous EKGs.  ____________________________________________    RADIOLOGY  CT angiogram chest pending  ____________________________________________   PROCEDURES Procedures  ____________________________________________   INITIAL IMPRESSION / ASSESSMENT AND PLAN / ED COURSE  Pertinent labs & imaging results that were available during my care of the patient were reviewed by me and considered in my medical decision making (see chart for details).  Patient presents with worsening shortness of breath, acute on chronic respiratory failure with hypoxia. I think most likely this is a COPD exacerbation related to recent viral illness and URI. However, with her increased oxygen requirement and slight tachycardia at 100 bpm and her medical history, we'll obtain a CT angiogram of the chest to evaluate for an acute PE. She is recently restarted on Xarelto so there may not be any acute intervention along these lines.  We'll give the patient Solu-Medrol DuoNeb and albuterol for symptom relief. Reassess after obtaining labs and CT scan. If the patient feels much better she may be suitable for outpatient follow-up with her pulmonology clinic appointment in 3 days. The symptoms are persistent, given the increased oxygen requirement as reported by the patient, she may warrant hospitalization. She is currently very calm and comfortable on 3 L nasal cannula with a sat of 95% in the treatment room.  Care of the patient is signed out to oncoming physician.   Clinical Course   ____________________________________________   FINAL CLINICAL IMPRESSION(S) / ED DIAGNOSES  Final diagnoses:  SOB (shortness of breath)  Acute on chronic respiratory failure with hypoxia (HCC)       Portions of this note were  generated with dragon dictation software. Dictation errors may occur despite best attempts at proofreading.    Carrie Mew, MD 01/31/16 1505

## 2016-01-31 NOTE — ED Notes (Signed)
Patient states that starting on Tuesday she noticed that her oxygen sats have been running low. Patient states that when she wakes up in the mornings her O2 is between 75-88%. Patient was at work today and she started feeling very short of breath, she was unable to walk down the hall, patient started feeling lightheaded and had tingling in her fingers and toes. Employer sent her to employee health and they sent her to ED because her O2 sats were in the 80's.   Patient states that she has had cough and congestion since Monday. Patient states that she is getting up yellow sputum. Patient started taking a Z-pac on Monday.

## 2016-01-31 NOTE — Progress Notes (Signed)
Patient is admitted to room 259 with the diagnosis of acute on chronic respiratory failure. Alert and oriented x 4. Tele box called to CCMD with Cleophas Dunker as a second Psychologist, counselling. Skin assessment done with Marcene Brawn as well, skin dry and intact to touch. Full assessment  completed in Epic . Patient was medicated with Tylenol 650 mg per her request for headache. On re- assessment, patient was resting quietly with her eyes closed, respirations even and unlabored. Will continue to monitor.

## 2016-01-31 NOTE — ED Notes (Signed)
Pt returned from CT, resting in bed, friend at bedside, pt awake and alert in no acute distress

## 2016-01-31 NOTE — Progress Notes (Signed)
S: pt presents to clinic complaining of difficulty breathing, is usually on 2l of 02 at home, pulse ox is usually around 88 or below, today is having difficulty breathing, feels weak and tired  O vitals w low 02 at 83%ra, pt appears tired, lungs c wheezing and crackles, cv rrr  A: acute resp. Distress  P: pt escorted to ER by RMA

## 2016-01-31 NOTE — ED Notes (Signed)
Pt complaining of her o2 burning, humidifier placed on o2

## 2016-01-31 NOTE — ED Triage Notes (Signed)
Pt presents to ED with reports of SOB and SpO2 82% RA.

## 2016-02-01 DIAGNOSIS — Z86711 Personal history of pulmonary embolism: Secondary | ICD-10-CM | POA: Insufficient documentation

## 2016-02-01 DIAGNOSIS — Z86718 Personal history of other venous thrombosis and embolism: Secondary | ICD-10-CM | POA: Insufficient documentation

## 2016-02-01 DIAGNOSIS — I5032 Chronic diastolic (congestive) heart failure: Secondary | ICD-10-CM | POA: Insufficient documentation

## 2016-02-01 LAB — CBC
HEMATOCRIT: 47.4 % — AB (ref 35.0–47.0)
HEMOGLOBIN: 16.2 g/dL — AB (ref 12.0–16.0)
MCH: 34.1 pg — ABNORMAL HIGH (ref 26.0–34.0)
MCHC: 34.2 g/dL (ref 32.0–36.0)
MCV: 99.7 fL (ref 80.0–100.0)
Platelets: 172 10*3/uL (ref 150–440)
RBC: 4.75 MIL/uL (ref 3.80–5.20)
RDW: 14.5 % (ref 11.5–14.5)
WBC: 6.6 10*3/uL (ref 3.6–11.0)

## 2016-02-01 LAB — GLUCOSE, CAPILLARY
GLUCOSE-CAPILLARY: 464 mg/dL — AB (ref 65–99)
Glucose-Capillary: 461 mg/dL — ABNORMAL HIGH (ref 65–99)
Glucose-Capillary: 451 mg/dL — ABNORMAL HIGH (ref 65–99)
Glucose-Capillary: 477 mg/dL — ABNORMAL HIGH (ref 65–99)
Glucose-Capillary: 403 mg/dL — ABNORMAL HIGH (ref 65–99)

## 2016-02-01 LAB — BASIC METABOLIC PANEL
ANION GAP: 7 (ref 5–15)
BUN: 13 mg/dL (ref 6–20)
CHLORIDE: 101 mmol/L (ref 101–111)
CO2: 28 mmol/L (ref 22–32)
Calcium: 8.6 mg/dL — ABNORMAL LOW (ref 8.9–10.3)
Creatinine, Ser: 0.95 mg/dL (ref 0.44–1.00)
GFR calc non Af Amer: >60 mL/min (ref >60)
Glucose, Bld: 533 mg/dL (ref 65–99)
POTASSIUM: 4.4 mmol/L (ref 3.5–5.1)
SODIUM: 136 mmol/L (ref 135–145)

## 2016-02-01 MED ORDER — INSULIN GLARGINE 100 UNIT/ML ~~LOC~~ SOLN
15.0000 [IU] | Freq: Every day | SUBCUTANEOUS | Status: DC
  Administered 2016-02-01: 15 [IU] via SUBCUTANEOUS
  Filled 2016-02-01: qty 0.15

## 2016-02-01 MED ORDER — INSULIN ASPART 100 UNIT/ML ~~LOC~~ SOLN
0.0000 [IU] | Freq: Three times a day (TID) | SUBCUTANEOUS | Status: DC
  Administered 2016-02-01 – 2016-02-02 (×2): 15 [IU] via SUBCUTANEOUS
  Administered 2016-02-02: 11 [IU] via SUBCUTANEOUS
  Administered 2016-02-02 (×2): 15 [IU] via SUBCUTANEOUS
  Administered 2016-02-03: 11 [IU] via SUBCUTANEOUS
  Filled 2016-02-01 (×2): qty 15
  Filled 2016-02-01: qty 5
  Filled 2016-02-01: qty 15
  Filled 2016-02-01 (×2): qty 11
  Filled 2016-02-01: qty 15

## 2016-02-01 MED ORDER — PREDNISONE 20 MG PO TABS
40.0000 mg | ORAL_TABLET | Freq: Every day | ORAL | Status: DC
  Administered 2016-02-02 – 2016-02-03 (×2): 40 mg via ORAL
  Filled 2016-02-01 (×2): qty 2

## 2016-02-01 MED ORDER — INSULIN ASPART 100 UNIT/ML ~~LOC~~ SOLN: 0.0000 [IU] | Freq: Every day | SUBCUTANEOUS | Status: DC

## 2016-02-01 MED ORDER — INSULIN ASPART 100 UNIT/ML ~~LOC~~ SOLN
0.0000 [IU] | Freq: Three times a day (TID) | SUBCUTANEOUS | Status: DC
  Administered 2016-02-01: 9 [IU] via SUBCUTANEOUS
  Filled 2016-02-01: qty 9

## 2016-02-01 MED ORDER — INSULIN ASPART 100 UNIT/ML ~~LOC~~ SOLN
0.0000 [IU] | Freq: Three times a day (TID) | SUBCUTANEOUS | Status: DC
  Administered 2016-02-01 (×2): 15 [IU] via SUBCUTANEOUS
  Filled 2016-02-01 (×2): qty 15

## 2016-02-01 MED ORDER — INSULIN ASPART 100 UNIT/ML ~~LOC~~ SOLN
5.0000 [IU] | Freq: Once | SUBCUTANEOUS | Status: AC
  Administered 2016-02-01: 5 [IU] via SUBCUTANEOUS
  Filled 2016-02-01: qty 5

## 2016-02-01 MED ORDER — INSULIN ASPART 100 UNIT/ML ~~LOC~~ SOLN
6.0000 [IU] | Freq: Three times a day (TID) | SUBCUTANEOUS | Status: DC
  Administered 2016-02-01 (×2): 6 [IU] via SUBCUTANEOUS
  Filled 2016-02-01 (×2): qty 6

## 2016-02-01 NOTE — Progress Notes (Signed)
Lab reported glucose of 533 and CBG=461. Dr. Marcille Blanco notified with a new order for 5 units of Novolog x 1 dose, a sliding scale and Hbg A1c . Patient is alert and oriented x 4 talkative and asymptomatic. Will continue to monitor.

## 2016-02-01 NOTE — Progress Notes (Signed)
CBG=403, Dr. Jannifer Franklin notified and he changed the sliding scale dose and indicated I should administer 15 units from the sliding scale. Order implemented as received.  Patient is asymptomatic.  Will continue to monitor.

## 2016-02-01 NOTE — Progress Notes (Addendum)
[facility] NAME: [name]    MR#:  [id number]  DATE OF BIRTH:  1965/06/12  SUBJECTIVE:  CHIEF COMPLAINT:   Chief Complaint  Patient presents with  . Shortness of Breath  . Low SpO2   Better shortness of breath, on O2 [place] 3 L. REVIEW OF SYSTEMS:  Review of Systems  Constitutional: Negative for chills, fever and malaise/fatigue.  Eyes: Negative for blurred vision.  Respiratory: Positive for cough and shortness of breath. Negative for hemoptysis, wheezing and stridor.   Cardiovascular: Negative for chest pain, palpitations and leg swelling.  Gastrointestinal: Negative for abdominal pain, constipation, diarrhea, nausea and vomiting.  Genitourinary: Negative for dysuria, hematuria and urgency.  Musculoskeletal: Negative for joint pain.  Neurological: Negative for dizziness, focal weakness, loss of consciousness, weakness and headaches.  Psychiatric/Behavioral: Negative for depression. The patient is not nervous/anxious.     DRUG ALLERGIES:   Allergies  Allergen Reactions  . Ampicillin Other (See Comments)    Severe abdominal pain, dizziness (penicillin is okay)  . Amoxicillin Other (See Comments)    Has patient had a PCN reaction causing immediate rash, facial/tongue/throat swelling, SOB or lightheadedness with hypotension: No Has patient had a PCN reaction causing severe rash involving mucus membranes or skin necrosis: No Has patient had a PCN reaction that required hospitalization No Has patient had a PCN reaction occurring within the last 10 years: No If all of the above answers are "NO", then may proceed with Cephalosporin use.  . Sulfa Antibiotics Other (See Comments)    Very bad yeast infection   VITALS:  Blood pressure 131/85, pulse 83, temperature 97.4 F (36.3 C), temperature source Oral, resp. rate 14, height _0  (1.651 m), weight 235 lb (106.6 kg), last menstrual period 05/11/2013, SpO2 91 %. PHYSICAL  EXAMINATION:  Physical Exam  Constitutional: She is oriented to person, place, and time and well-developed, well-nourished, and in no distress.  HENT:  Head: Normocephalic.  Mouth/Throat: Oropharynx is clear and moist.  Eyes: Conjunctivae and EOM are normal. Pupils are equal, round, and reactive to light.  Neck: Normal range of motion. Neck supple. No JVD present. No tracheal deviation present.  Cardiovascular: Normal rate, regular rhythm and normal heart sounds.  Exam reveals no gallop.   No murmur heard. Pulmonary/Chest: Effort normal and breath sounds normal. No respiratory distress. She has no wheezes. She has no rales.  Abdominal: Soft. Bowel sounds are normal. She exhibits no distension. There is no tenderness.  Musculoskeletal: Normal range of motion. She exhibits no edema or tenderness.  Neurological: She is alert and oriented to person, place, and time. No cranial nerve deficit.  Skin: No rash noted. No erythema.  Psychiatric: Affect and judgment normal.   LABORATORY PANEL:   CBC  Recent Labs Lab 02/01/16 0525  WBC 6.6  HGB 16.2*  HCT 47.4*  PLT 172   ------------------------------------------------------------------------------------------------------------------ Chemistries   Recent Labs Lab 02/01/16 0525  NA 136  K 4.4  CL 101  CO2 28  GLUCOSE 533*  BUN 13  CREATININE 0.95  CALCIUM 8.6*   RADIOLOGY:  Ct Angio Chest Pe W And/or Wo Contrast  Result Date: 01/31/2016 CLINICAL DATA:  Chronic home oxygen has had lower oxygen saturations. Intermittent central non radiating chest pain EXAM: CT ANGIOGRAPHY CHEST WITH CONTRAST TECHNIQUE: Multidetector CT imaging of the chest was performed using the standard protocol during bolus administration of intravenous contrast. Multiplanar CT image reconstructions and MIPs were obtained to evaluate  the vascular anatomy. CONTRAST:  75 mL Isovue 370 intravenous COMPARISON:  None. FINDINGS: Cardiovascular: No filling defect within  the central or segmental pulmonary arteries to suggest an acute embolus. There is marked enlargement of the pulmonary artery trunk, measuring 5 cm in transverse dimension. Main pulmonary artery is are also enlarged. This suggests pulmonary artery hypertension. There is ectasia of the ascending aorta measuring up to 4 cm. No dissection is seen. Heart size is mildly enlarged with asymmetric enlargement of the right atrium and right ventricle. No pericardial effusion. Mediastinum/Nodes: There are prominent right hilar lymph nodes measuring up to 1.6 cm. Nonspecific sub cm mediastinal nodes. No axillary adenopathy. Imaged thyroid gland is normal. Trachea and mainstem bronchi unremarkable. Esophagus within normal limits. Lungs/Pleura: No acute consolidation or effusion. Patchy areas of hazy attenuation could relate to atelectasis versus small airways disease. There is a 4 mm left lower Upper Abdomen: No acute abnormalities in the upper abdomen. Musculoskeletal: No chest wall abnormality. No acute or significant osseous findings. Review of the MIP images confirms the above findings. IMPRESSION: 1. No CT evidence for acute pulmonary embolus.  No dissection. 2. Significantly enlarged pulmonary artery trunk and main pulmonary artery is suggesting pulmonary artery hypertension. Asymmetric enlargement of the right atrium and right ventricle suggests elevated right heart pressures. 3. Ectasia of the ascending aorta up to 4 cm. Recommend annual imaging followup by CTA or MRA. This recommendation follows 2010 ACCF/AHA/AATS/ACR/ASA/SCA/SCAI/SIR/STS/SVM Guidelines for the Diagnosis and Management of Patients with Thoracic Aortic Disease. Circulation. 2010; 121: e266-e369 4. 4 mm left lower lobe pulmonary nodule. No follow-up needed if patient is low-risk. Non-contrast chest CT can be considered in 12 months if patient is high-risk. This recommendation follows the consensus statement: Guidelines for Management of Incidental  Pulmonary Nodules Detected on CT Images: From the Fleischner Society 2017; Radiology 2017; 284:228-243. lobe subpleural nodule, series 6, image number 46. Electronically Signed   By: Donavan Foil M.D.   On: 01/31/2016 16:10   US Venous Img Lower Bilateral  Result Date: 01/31/2016 CLINICAL DATA:  Acute onset of shortness of breath and intermittent bilateral lower extremity pain. Initial encounter. EXAM: BILATERAL LOWER EXTREMITY VENOUS DOPPLER ULTRASOUND TECHNIQUE: Gray-scale sonography with graded compression, as well as color Doppler and duplex ultrasound were performed to evaluate the lower extremity deep venous systems from the level of the common femoral vein and including the common femoral, femoral, profunda femoral, popliteal and calf veins including the posterior tibial, peroneal and gastrocnemius veins when visible. The superficial great saphenous vein was also interrogated. Spectral Doppler was utilized to evaluate flow at rest and with distal augmentation maneuvers in the common femoral, femoral and popliteal veins. COMPARISON:  None. FINDINGS: RIGHT LOWER EXTREMITY Common Femoral Vein: No evidence of thrombus. Normal compressibility, respiratory phasicity and response to augmentation. Saphenofemoral Junction: No evidence of thrombus. Normal compressibility and flow on color Doppler imaging. Profunda Femoral Vein: No evidence of thrombus. Normal compressibility and flow on color Doppler imaging. Femoral Vein: No evidence of thrombus. Normal compressibility, respiratory phasicity and response to augmentation. Popliteal Vein: No evidence of thrombus. Normal compressibility, respiratory phasicity and response to augmentation. Calf Veins: No evidence of thrombus. Normal compressibility and flow on color Doppler imaging. Superficial Great Saphenous Vein: No evidence of thrombus. Normal compressibility and flow on color Doppler imaging. Venous Reflux:  None. Other Findings:  None. LEFT LOWER EXTREMITY  Common Femoral Vein: No evidence of thrombus. Normal compressibility, respiratory phasicity and response to augmentation. Saphenofemoral Junction: No evidence of thrombus. Normal  compressibility and flow on color Doppler imaging. Profunda Femoral Vein: No evidence of thrombus. Normal compressibility and flow on color Doppler imaging. Femoral Vein: No evidence of thrombus. Normal compressibility, respiratory phasicity and response to augmentation. Popliteal Vein: No evidence of thrombus. Normal compressibility, respiratory phasicity and response to augmentation. Calf Veins: No evidence of thrombus. Normal compressibility and flow on color Doppler imaging. Superficial Great Saphenous Vein: No evidence of thrombus. Normal compressibility and flow on color Doppler imaging. Venous Reflux:  None. Other Findings:  None. IMPRESSION: No evidence of deep venous thrombosis. Electronically Signed   By: Garald Balding M.D.   On: 01/31/2016 19:19   ASSESSMENT AND PLAN:   #1 acute on chronic respiratory failure secondary to COPD exacerbation: continue iv steroids,nebs,on oxygen 3 L [place]. dulera.  2.severe pulmonary HTN.  T-wave inversions in the lateral leads; Continue Norvasc, spironolactone and Lasix. F/u Echo and cardiology consult. follows up with Dr. Christy Sartorius as outpatient.  3.H/o PE , No CT evidence for acute pulmonary embolus. Restarted on  Xarelto. No leg DVT per [place].  4. Osa;cpap;at night  * Hyperglycemia with uncontrolled diabetes 2. Patient is off metformin for 1 year. Blood sugar is more than 400 since last night. I will start Lantus 15 units HS with NovoLog 6 units before meals, and sliding scale. Taper steroids.  Hypertension. Continue Norvasc, Lasix, spironolactone and Cozaar  All the records are reviewed and case discussed with Care Management/Social Worker. Management plans discussed with the patient, family and they are in agreement.  CODE STATUS: Full code  TOTAL TIME TAKING CARE OF THIS  PATIENT: 38 minutes.   More than 50% of the time was spent in counseling/coordination of care: YES  POSSIBLE D/C IN 2 DAYS, DEPENDING ON CLINICAL CONDITION.   Demetrios Loll M.D on 02/01/2016 at 2:04 PM  Between 7am to 6pm - Pager - [phone]  After 6pm go to [email or web address] - Proofreader  [facility]  Office  [phone]  CC: Primary care physician; Antony Blackbird, MD  Note: This dictation was prepared with Dragon dictation along with smaller phrase technology. Any transcriptional errors that result from this process are unintentional.

## 2016-02-01 NOTE — Progress Notes (Signed)
[facility] CPAP C-1 placed in pt's room. CPAP plugged into red outlet. Pt is not ready for sleep. Pt will call when ready to place CPAP.

## 2016-02-02 LAB — GLUCOSE, CAPILLARY
GLUCOSE-CAPILLARY: 480 mg/dL — AB (ref 65–99)
Glucose-Capillary: 273 mg/dL — ABNORMAL HIGH (ref 65–99)
Glucose-Capillary: 311 mg/dL — ABNORMAL HIGH (ref 65–99)
Glucose-Capillary: 498 mg/dL — ABNORMAL HIGH (ref 65–99)
Glucose-Capillary: 435 mg/dL — ABNORMAL HIGH (ref 65–99)

## 2016-02-02 LAB — GLUCOSE, RANDOM: Glucose, Bld: 366 mg/dL — ABNORMAL HIGH (ref 65–99)

## 2016-02-02 MED ORDER — METFORMIN HCL 500 MG PO TABS
500.0000 mg | ORAL_TABLET | Freq: Two times a day (BID) | ORAL | Status: DC
  Administered 2016-02-03: 500 mg via ORAL
  Filled 2016-02-02: qty 1

## 2016-02-02 MED ORDER — INSULIN GLARGINE 100 UNIT/ML ~~LOC~~ SOLN
25.0000 [IU] | Freq: Every day | SUBCUTANEOUS | Status: DC
  Administered 2016-02-02: 25 [IU] via SUBCUTANEOUS
  Filled 2016-02-02 (×2): qty 0.25

## 2016-02-02 MED ORDER — INSULIN REGULAR HUMAN 100 UNIT/ML IJ SOLN
5.0000 [IU] | Freq: Once | INTRAMUSCULAR | Status: AC
  Administered 2016-02-02: 5 [IU] via SUBCUTANEOUS
  Filled 2016-02-02: qty 0.05

## 2016-02-02 MED ORDER — INSULIN ASPART 100 UNIT/ML ~~LOC~~ SOLN
10.0000 [IU] | Freq: Three times a day (TID) | SUBCUTANEOUS | Status: DC
  Administered 2016-02-02 – 2016-02-03 (×5): 10 [IU] via SUBCUTANEOUS
  Filled 2016-02-02 (×5): qty 10

## 2016-02-02 NOTE — Progress Notes (Signed)
[place] [facility] [place] NAME: [name]    MR#:  [id number]  DATE OF BIRTH:  June 05, 1965  SUBJECTIVE:  CHIEF COMPLAINT:   Chief Complaint  Patient presents with  . Shortness of Breath  . Low SpO2   Better cough and shortness of breath, on O2 [place] 3 L. REVIEW OF SYSTEMS:  Review of Systems  Constitutional: Negative for chills, fever and malaise/fatigue.  Eyes: Negative for blurred vision.  Respiratory: Positive for cough and shortness of breath. Negative for hemoptysis, wheezing and stridor.   Cardiovascular: Negative for chest pain, palpitations and leg swelling.  Gastrointestinal: Negative for abdominal pain, constipation, diarrhea, nausea and vomiting.  Genitourinary: Negative for dysuria, hematuria and urgency.  Musculoskeletal: Negative for joint pain.  Neurological: Negative for dizziness, focal weakness, loss of consciousness, weakness and headaches.  Psychiatric/Behavioral: Negative for depression. The patient is not nervous/anxious.     DRUG ALLERGIES:   Allergies  Allergen Reactions  . Ampicillin Other (See Comments)    Severe abdominal pain, dizziness (penicillin is okay)  . Amoxicillin Other (See Comments)    Has patient had a PCN reaction causing immediate rash, facial/tongue/throat swelling, SOB or lightheadedness with hypotension: No Has patient had a PCN reaction causing severe rash involving mucus membranes or skin necrosis: No Has patient had a PCN reaction that required hospitalization No Has patient had a PCN reaction occurring within the last 10 years: No If all of the above answers are "NO", then may proceed with Cephalosporin use.  . Sulfa Antibiotics Other (See Comments)    Very bad yeast infection   VITALS:  Blood pressure 132/81, pulse 87, temperature 97.9 F (36.6 C), temperature source Oral, resp. rate 20, height _0  (1.651 m), weight 240 lb (108.9 kg), last menstrual period 05/11/2013, SpO2 93  %. PHYSICAL EXAMINATION:  Physical Exam  Constitutional: She is oriented to person, place, and time and well-developed, well-nourished, and in no distress.  HENT:  Head: Normocephalic.  Mouth/Throat: Oropharynx is clear and moist.  Eyes: Conjunctivae and EOM are normal. Pupils are equal, round, and reactive to light.  Neck: Normal range of motion. Neck supple. No JVD present. No tracheal deviation present.  Cardiovascular: Normal rate, regular rhythm and normal heart sounds.  Exam reveals no gallop.   No murmur heard. Pulmonary/Chest: Effort normal. No respiratory distress. She has wheezes. She has no rales.  Abdominal: Soft. Bowel sounds are normal. She exhibits no distension. There is no tenderness.  Musculoskeletal: Normal range of motion. She exhibits no edema or tenderness.  Neurological: She is alert and oriented to person, place, and time. No cranial nerve deficit.  Skin: No rash noted. No erythema.  Psychiatric: Affect and judgment normal.   LABORATORY PANEL:   CBC  Recent Labs Lab 02/01/16 0525  WBC 6.6  HGB 16.2*  HCT 47.4*  PLT 172   ------------------------------------------------------------------------------------------------------------------ Chemistries   Recent Labs Lab 02/01/16 0525 02/02/16 0508  NA 136  --   K 4.4  --   CL 101  --   CO2 28  --   GLUCOSE 533* 366*  BUN 13  --   CREATININE 0.95  --   CALCIUM 8.6*  --    RADIOLOGY:  No results found. ASSESSMENT AND PLAN:   #1 acute on chronic respiratory failure secondary to COPD exacerbation: Discontinue iv steroids, changed to prednisone by mouth, continue nebs and dulera, try to wean down oxygen [place].  2.severe pulmonary HTN.  T-wave inversions  in the lateral leads; Continue Norvasc, spironolactone and Lasix. F/u Echo and cardiology consult. follows up with Dr. Christy Sartorius as outpatient.  3.H/o PE , No CT evidence for acute pulmonary embolus. Restarted on  Xarelto. No leg DVT per [place].  4.  Osa;cpap;at night  * Hyperglycemia with uncontrolled diabetes 2. Still not controlled. Patient is off metformin for 1 year. Blood sugar is 311 today. I will increase Lantus 25 units HS with NovoLog 10 units before meals, and sliding scale. Taper steroids.  Hypertension. Continue Norvasc, Lasix, spironolactone and Cozaar  All the records are reviewed and case discussed with Care Management/Social Worker. Management plans discussed with the patient, family and they are in agreement.  CODE STATUS: Full code  TOTAL TIME TAKING CARE OF THIS PATIENT: 37 minutes.   More than 50% of the time was spent in counseling/coordination of care: YES  POSSIBLE D/C IN 2 DAYS, DEPENDING ON CLINICAL CONDITION.   Demetrios Loll M.D on 02/02/2016 at 1:29 PM  Between 7am to 6pm - Pager - [phone]  After 6pm go to [email or web address] - Proofreader  [facility]  Office  [phone]  CC: Primary care physician; Antony Blackbird, MD  Note: This dictation was prepared with Dragon dictation along with smaller phrase technology. Any transcriptional errors that result from this process are unintentional.

## 2016-02-02 NOTE — Progress Notes (Signed)
CBG=498, Dr. Jannifer Franklin notified with a new order for 5 units of Humulin IV x 1 dose in addition to the  scheduled 25 units of Lantus and recheck in an hour. Will continue to monitor.

## 2016-02-02 NOTE — Progress Notes (Signed)
Rechecked blood sugar  was 435. DR. Willis notified  again and received another order to give 15 units of Novolg and recheck CBG at 0300. Patient is asymptomatic during this hyperglycemic episode. Will continue to monitor.

## 2016-02-02 NOTE — Progress Notes (Signed)
SATURATION QUALIFICATIONS: (This note is used to comply with regulatory documentation for home oxygen)  Patient Saturations on Room Air at Rest =91 Patient Saturations on Room Air while Ambulating =86  Patient Saturations on 3Liters of oxygen while Ambulating = 90  Please briefly explain why patient needs home oxygen:

## 2016-02-03 ENCOUNTER — Inpatient Hospital Stay (HOSPITAL_COMMUNITY)
Admit: 2016-02-03 | Discharge: 2016-02-03 | Disposition: A | Discharge status: Home / Self Care | Payer: BLUE CROSS/BLUE SHIELD | Attending: Internal Medicine | Admitting: Internal Medicine

## 2016-02-03 DIAGNOSIS — R55 Syncope and collapse: Secondary | ICD-10-CM

## 2016-02-03 LAB — ECHOCARDIOGRAM COMPLETE
AOPV: 0.76 m/s
AV Area VTI: 1.94 cm2
AV Peak grad: 5 mmHg
AV peak Index: 0.84
AV pk vel: 113 cm/s
E decel time: 232 msec
E/e' ratio: 6.05
FS: 32 % (ref 28–44)
HEIGHTINCHES: 65 in
IVS/LV PW RATIO, ED: 1.04
LA diam index: 1.52 cm/m2
LA vol A4C: 33.4 ml
LASIZE: 35 mm
LDCA: 2.54 cm2
LEFT ATRIUM END SYS DIAM: 35 mm
LV E/e'average: 6.05
LV PW d: 10.2 mm — AB (ref 0.6–1.1)
LV TDI E'MEDIAL: 5.77
LV e' LATERAL: 8.49 cm/s
LVEEMED: 6.05
LVOTD: 18 mm
LVOTPV: 86.3 cm/s
MV Dec: 232
MV pk A vel: 41.2 m/s
MV pk E vel: 51.4 m/s
RV LATERAL S' VELOCITY: 10.9 cm/s
TAPSE: 19.1 mm
TDI e' lateral: 8.49
WEIGHTICAEL: 3902.4 [oz_av]

## 2016-02-03 LAB — GLUCOSE, CAPILLARY
GLUCOSE-CAPILLARY: 203 mg/dL — AB (ref 65–99)
GLUCOSE-CAPILLARY: 111 mg/dL — AB (ref 65–99)
GLUCOSE-CAPILLARY: 306 mg/dL — AB (ref 65–99)

## 2016-02-03 LAB — GLUCOSE, RANDOM: Glucose, Bld: 125 mg/dL — ABNORMAL HIGH (ref 65–99)

## 2016-02-03 LAB — HEMOGLOBIN A1C
Hgb A1c MFr Bld: 8.8 % — ABNORMAL HIGH (ref 4.8–5.6)
Mean Plasma Glucose: 206 mg/dL

## 2016-02-03 MED ORDER — METOPROLOL TARTRATE 25 MG PO TABS: 25.0000 mg | ORAL_TABLET | Freq: Two times a day (BID) | ORAL | 2 refills | Status: DC

## 2016-02-03 MED ORDER — INSULIN GLARGINE 100 UNIT/ML ~~LOC~~ SOLN: 25.0000 [IU] | Freq: Every day | SUBCUTANEOUS | 0 refills | Status: DC

## 2016-02-03 MED ORDER — METFORMIN HCL 500 MG PO TABS: 500.0000 mg | ORAL_TABLET | Freq: Two times a day (BID) | ORAL | 2 refills | Status: DC

## 2016-02-03 MED ORDER — PERFLUTREN LIPID MICROSPHERE
1.0000 mL | INTRAVENOUS | Status: AC | PRN
  Administered 2016-02-03: 2 mL via INTRAVENOUS
  Filled 2016-02-03: qty 10

## 2016-02-03 MED ORDER — LIVING WELL WITH DIABETES BOOK
Freq: Once | Status: AC
  Administered 2016-02-03: 12:00:00
  Filled 2016-02-03: qty 1

## 2016-02-03 NOTE — Discharge Summary (Signed)
[place] [facility] [place] NAME: [name]    MR#:  [id number]  DATE OF BIRTH:  1974/12/03  DATE OF ADMISSION:  01/31/2016   ADMITTING PHYSICIAN: Epifanio Lesches, MD  DATE OF DISCHARGE: 02/03/2016 PRIMARY CARE PHYSICIAN: FULP, CAMMIE, MD   ADMISSION DIAGNOSIS:  SOB (shortness of breath) [R06.02] Pain [R52] Acute on chronic respiratory failure with hypoxia (HCC) [J96.21] DISCHARGE DIAGNOSIS:  Active Problems:   Acute on chronic respiratory failure (HCC) COPD exacerbation Uncontrolled diabetes 2 with hyperglycemia Severe pulmonary hypertension SECONDARY DIAGNOSIS:   Past Medical History:  Diagnosis Date  . Anemia    iron deficiency  . Diabetes mellitus, type II ([place])   . DVT (deep venous thrombosis) ([place]) 2014   left leg  . HTN (hypertension)   . Medical history non-contributory   . Menorrhagia    secondary to uterine fibroids  . OSA (obstructive sleep apnea)    07/25/13 HST AHI 33/hr, severe hypoxemia O2 min 42% and 95% of the time <89%  . PE (pulmonary embolism) 2014   bilateral  . Pulmonary nodule    (60m on loeft lower lobe) found on CT scan July 2014, repeat scan Jan 2015 showed less than 430m . S/P TAH (total abdominal hysterectomy) 06/07/2013   HOSPITAL COURSE:  #1 acute on chronic respiratory failure secondary to COPD exacerbation: She has been treated with iv steroids, changed to prednisone by mouth, continue nebs and dulera, weaned down oxygen [place] to 2 L today.  2.severe pulmonary HTN.  T-wave inversions in the lateral leads; Continue Norvasc, spironolactone and Lasix. F/u Echo and Dr KhHumphrey Rollss outpatient. follows up with Dr. ViChristy Sartoriuss outpatient.  3.H/o PE ,No CT evidence for acute pulmonary embolus. Restarted on Xarelto. No leg DVT per [place] 4. Osa;cpap;at night  * Hyperglycemia with uncontrolled diabetes 2. Still not controlled but better controlled. Patient is off metformin for 1 year. Blood sugar is  111  This am and 306 at lunch time today. I increased Lantus 25 units HS with NovoLog 10 units before meals, and sliding scale, started metformin. Discontinue prednisone. Continue Lantus and metformin after discharge. Follow-up PCP for adjustment  Hypertension. Continue Norvasc, Lasix, spironolactone and Cozaar, and Lopressor.  I discussed with Dr. KhHumphrey RollsDISCHARGE CONDITIONS:  Stable, discharge to home today CONSULTS OBTAINED:  Treatment Team:  ShDionisio DavidMD DRUG ALLERGIES:   Allergies  Allergen Reactions  . Ampicillin Other (See Comments)    Severe abdominal pain, dizziness (penicillin is okay)  . Amoxicillin Other (See Comments)    Has patient had a PCN reaction causing immediate rash, facial/tongue/throat swelling, SOB or lightheadedness with hypotension: No Has patient had a PCN reaction causing severe rash involving mucus membranes or skin necrosis: No Has patient had a PCN reaction that required hospitalization No Has patient had a PCN reaction occurring within the last 10 years: No If all of the above answers are "NO", then may proceed with Cephalosporin use.  . Sulfa Antibiotics Other (See Comments)    Very bad yeast infection   DISCHARGE MEDICATIONS:     Medication List    STOP taking these medications   enoxaparin 100 MG/ML injection Commonly known as:  LOVENOX     TAKE these medications   acetaminophen 500 MG tablet Commonly known as:  TYLENOL Take 500 mg by mouth every 6 (six) hours as needed for moderate pain or headache.   albuterol 108 (90 Base) MCG/ACT inhaler Commonly known as:  PROVENTIL HFA;VENTOLIN HFA Inhale 2 puffs into the lungs every 6 (six) hours as needed for wheezing or shortness of breath.   amLODipine 5 MG tablet Commonly known as:  NORVASC Take 1 tablet by mouth daily.   Azelastine-Fluticasone 137-50 MCG/ACT Susp Place 1 spray into the nose 2 (two) times daily.   cetirizine 10 MG tablet Commonly known as:  ZYRTEC Take 1 tablet  (10 mg total) by mouth at bedtime. What changed:  when to take this   fluticasone 50 MCG/ACT nasal spray Commonly known as:  FLONASE Place 2 sprays into both nostrils daily. What changed:  when to take this  reasons to take this   Fluticasone-Salmeterol 250-50 MCG/DOSE Aepb Commonly known as:  ADVAIR DISKUS Inhale 1 puff into the lungs 2 (two) times daily.   furosemide 20 MG tablet Commonly known as:  LASIX Take 40 mg by mouth daily.   insulin glargine 100 UNIT/ML injection Commonly known as:  LANTUS Inject 0.25 mLs (25 Units total) into the skin at bedtime.   losartan 100 MG tablet Commonly known as:  COZAAR Take 1 tablet (100 mg total) by mouth daily.   metFORMIN 500 MG tablet Commonly known as:  GLUCOPHAGE Take 1 tablet (500 mg total) by mouth 2 (two) times daily with a meal.   metoprolol tartrate 25 MG tablet Commonly known as:  LOPRESSOR Take 1 tablet (25 mg total) by mouth 2 (two) times daily.   promethazine-codeine 6.25-10 MG/5ML syrup Commonly known as:  PHENERGAN with CODEINE Take 5 mLs by mouth every 4 (four) hours as needed for cough.   rivaroxaban 20 MG Tabs tablet Commonly known as:  XARELTO Take 1 tablet (20 mg total) by mouth daily with supper. What changed:  additional instructions  Another medication with the same name was removed. Continue taking this medication, and follow the directions you see here.   spironolactone 50 MG tablet Commonly known as:  ALDACTONE Take 1 tablet by mouth daily.        DISCHARGE INSTRUCTIONS:  See AVS. OXYGEN:  Home Oxygen: Yes, [place] 2L. DISCHARGE LOCATION:    If you experience worsening of your admission symptoms, develop shortness of breath, life threatening emergency, suicidal or homicidal thoughts you must seek medical attention immediately by calling 911 or calling your MD immediately  if symptoms less severe.  You Must read complete instructions/literature along with all the possible adverse  reactions/side effects for all the Medicines you take and that have been prescribed to you. Take any new Medicines after you have completely understood and accpet all the possible adverse reactions/side effects.   Please note  You were cared for by a hospitalist during your hospital stay. If you have any questions about your discharge medications or the care you received while you were in the hospital after you are discharged, you can call the unit and asked to speak with the hospitalist on call if the hospitalist that took care of you is not available. Once you are discharged, your primary care physician will handle any further medical issues. Please note that NO REFILLS for any discharge medications will be authorized once you are discharged, as it is imperative that you return to your primary care physician (or establish a relationship with a primary care physician if you do not have one) for your aftercare needs so that they can reassess your need for medications and monitor your lab values.    On the day of Discharge:  VITAL SIGNS:  Blood pressure 106/70, pulse  67, temperature 97.8 F (36.6 C), temperature source Oral, resp. rate 17, height _0  (1.651 m), weight 243 lb 14.4 oz (110.6 kg), last menstrual period 05/11/2013, SpO2 94 %. PHYSICAL EXAMINATION:  GENERAL:  50 y.o.-year-old patient lying in the bed with no acute distress.  EYES: Pupils equal, round, reactive to light and accommodation. No scleral icterus. Extraocular muscles intact.  HEENT: Head atraumatic, normocephalic. Oropharynx and nasopharynx clear.  NECK:  Supple, no jugular venous distention. No thyroid enlargement, no tenderness.  LUNGS: Normal breath sounds bilaterally, no wheezing, rales,rhonchi or crepitation. No use of accessory muscles of respiration.  CARDIOVASCULAR: S1, S2 normal. No murmurs, rubs, or gallops.  ABDOMEN: Soft, non-tender, non-distended. Bowel sounds present. No organomegaly or mass.  EXTREMITIES: No  pedal edema, cyanosis, or clubbing.  NEUROLOGIC: Cranial nerves II through XII are intact. Muscle strength 5/5 in all extremities. Sensation intact. Gait not checked.  PSYCHIATRIC: The patient is alert and oriented x 3.  SKIN: No obvious rash, lesion, or ulcer.  DATA REVIEW:   CBC  Recent Labs Lab 02/01/16 0525  WBC 6.6  HGB 16.2*  HCT 47.4*  PLT 172    Chemistries   Recent Labs Lab 02/01/16 0525  02/03/16 0649  NA 136  --   --   K 4.4  --   --   CL 101  --   --   CO2 28  --   --   GLUCOSE 533*  < > 125*  BUN 13  --   --   CREATININE 0.95  --   --   CALCIUM 8.6*  --   --   < > = values in this interval not displayed.   Microbiology Results  Results for orders placed or performed during the hospital encounter of 10/04/12  Urine culture     Status: None   Collection Time: 10/06/12  9:40 AM  Result Value Ref Range Status   Specimen Description URINE, CLEAN CATCH  Final   Special Requests NONE  Final   Culture  Setup Time 10/06/2012 13:42  Final   Colony Count >=100,000 COLONIES/ML  Final   Culture KLEBSIELLA SPECIES  Final   Report Status 10/08/2012 FINAL  Final   Organism ID, Bacteria KLEBSIELLA SPECIES  Final      Susceptibility   Klebsiella species - MIC*    AMPICILLIN RESISTANT      CEFAZOLIN <=4 SENSITIVE Sensitive     CEFTRIAXONE <=1 SENSITIVE Sensitive     CIPROFLOXACIN <=0.25 SENSITIVE Sensitive     GENTAMICIN <=1 SENSITIVE Sensitive     NITROFURANTOIN <=16 SENSITIVE Sensitive     TOBRAMYCIN <=1 SENSITIVE Sensitive     TRIMETH/SULFA <=20 SENSITIVE Sensitive     PIP/TAZO <=4 SENSITIVE Sensitive     * KLEBSIELLA SPECIES    RADIOLOGY:  No results found.   Management plans discussed with the patient, family and they are in agreement.  CODE STATUS:     Code Status Orders        Start     Ordered   01/31/16 1820  Full code  Continuous     01/31/16 1824    Code Status History    Date Active Date Inactive Code Status Order ID Comments User  Context   06/07/2013 10:48 PM 06/10/2013  3:26 PM Full Code [id number]  Elveria Royals, MD Inpatient   05/14/2013  1:42 PM 05/16/2013  2:57 PM Full Code [id number]  Kelvin Cellar, MD Inpatient      TOTAL  TIME TAKING CARE OF THIS PATIENT: 38 minutes.    Demetrios Loll M.D on 02/03/2016 at 2:45 PM  Between 7am to 6pm - Pager - [phone]  After 6pm go to [email or web address] - Proofreader  [facility]  Office  [phone]  CC: Primary care physician; Antony Blackbird, MD   Note: This dictation was prepared with Dragon dictation along with smaller phrase technology. Any transcriptional errors that result from this process are unintentional.

## 2016-02-03 NOTE — Progress Notes (Signed)
Pt placed on [facility] C-1 CPAP. CPAP plugged into red outlet. 3L O2 in line.

## 2016-02-03 NOTE — Progress Notes (Signed)
[name] is a 50 y.o. female  [id number]  Primary Cardiologist: Neoma Laming Reason for Consultation: Shortness of breath  HPI: This is a 50 year old African-American female with a past medical history of pulmonary hypertension and obstructive sleep apnea pulmonary nodules and pulmonary embolism presented to the hospital with chest pain shortness of breath orthopnea PND and leg swelling   Review of Systems: Patient has occasional chest pain   Past Medical History:  Diagnosis Date  . Anemia    iron deficiency  . Diabetes mellitus, type II ([place])   . DVT (deep venous thrombosis) ([place]) 2014   left leg  . HTN (hypertension)   . Medical history non-contributory   . Menorrhagia    secondary to uterine fibroids  . OSA (obstructive sleep apnea)    07/25/13 HST AHI 33/hr, severe hypoxemia O2 min 42% and 95% of the time <89%  . PE (pulmonary embolism) 2014   bilateral  . Pulmonary nodule    (76m on loeft lower lobe) found on CT scan July 2014, repeat scan Jan 2015 showed less than 420m . S/P TAH (total abdominal hysterectomy) 06/07/2013    Medications Prior to Admission  Medication Sig Dispense Refill  . acetaminophen (TYLENOL) 500 MG tablet Take 500 mg by mouth every 6 (six) hours as needed for moderate pain or headache.    . albuterol (PROVENTIL HFA;VENTOLIN HFA) 108 (90 Base) MCG/ACT inhaler Inhale 2 puffs into the lungs every 6 (six) hours as needed for wheezing or shortness of breath. 1 Inhaler 2  . amLODipine (NORVASC) 5 MG tablet Take 1 tablet by mouth daily.  5  . Azelastine-Fluticasone 137-50 MCG/ACT SUSP Place 1 spray into the nose 2 (two) times daily. 23 g 5  . cetirizine (ZYRTEC) 10 MG tablet Take 1 tablet (10 mg total) by mouth at bedtime. (Patient taking differently: Take 10 mg by mouth daily. ) 30 tablet 5  . fluticasone (FLONASE) 50 MCG/ACT nasal spray Place 2 sprays into both nostrils daily. (Patient taking differently: Place 2 sprays into both nostrils daily as  needed for allergies. ) 16 g 5  . Fluticasone-Salmeterol (ADVAIR DISKUS) 250-50 MCG/DOSE AEPB Inhale 1 puff into the lungs 2 (two) times daily. 60 each 5  . furosemide (LASIX) 20 MG tablet Take 40 mg by mouth daily.     . Marland Kitchenosartan (COZAAR) 100 MG tablet Take 1 tablet (100 mg total) by mouth daily. 30 tablet 0  . promethazine-codeine (PHENERGAN WITH CODEINE) 6.25-10 MG/5ML syrup Take 5 mLs by mouth every 4 (four) hours as needed for cough.   0  . Rivaroxaban (XARELTO) 15 MG TABS tablet Take 1 tablet (15 mg total) by mouth 2 (two) times daily with a meal. 42 tablet 0  . rivaroxaban (XARELTO) 20 MG TABS tablet Take 1 tablet (20 mg total) by mouth daily with supper. (Patient taking differently: Take 20 mg by mouth daily with supper. Start on day 22) 30 tablet 5  . spironolactone (ALDACTONE) 50 MG tablet Take 1 tablet by mouth daily.  0  . enoxaparin (LOVENOX) 100 MG/ML injection Inject 1 mL (100 mg total) into the skin every 12 (twelve) hours. (Patient not taking: Reported on 12/31/2015) 60 mL 5     . amLODipine  5 mg Oral Daily  . azelastine  2 spray Each Nare BID  . docusate sodium  100 mg Oral BID  . fluticasone  2 spray Each Nare Daily  . furosemide  40 mg Oral Daily  . insulin  aspart  0-15 Units Subcutaneous TID AC & HS  . insulin aspart  10 Units Subcutaneous TID WC  . insulin glargine  25 Units Subcutaneous QHS  . ipratropium-albuterol  3 mL Nebulization Q6H  . loratadine  10 mg Oral Daily  . losartan  100 mg Oral Daily  . metFORMIN  500 mg Oral BID WC  . metoprolol tartrate  25 mg Oral BID  . mometasone-formoterol  2 puff Inhalation BID  . predniSONE  40 mg Oral Q breakfast  . rivaroxaban  15 mg Oral BID WC   Followed by  . [START ON 02/04/2016] rivaroxaban  20 mg Oral Q supper  . spironolactone  50 mg Oral Daily    Infusions:   Allergies  Allergen Reactions  . Ampicillin Other (See Comments)    Severe abdominal pain, dizziness (penicillin is okay)  . Amoxicillin Other (See  Comments)    Has patient had a PCN reaction causing immediate rash, facial/tongue/throat swelling, SOB or lightheadedness with hypotension: No Has patient had a PCN reaction causing severe rash involving mucus membranes or skin necrosis: No Has patient had a PCN reaction that required hospitalization No Has patient had a PCN reaction occurring within the last 10 years: No If all of the above answers are "NO", then may proceed with Cephalosporin use.  . Sulfa Antibiotics Other (See Comments)    Very bad yeast infection    Social History   Social History  . Marital status: Legally Separated    Spouse name: N/A  . Number of children: N/A  . Years of education: N/A   Occupational History  . Not on file.   Social History Main Topics  . Smoking status: Former Smoker    Packs/day: 0.33    Years: 10.00    Types: Cigarettes    Quit date: 10/04/2011  . Smokeless tobacco: Never Used  . Alcohol use 0.0 oz/week     Comment: social  . Drug use: No  . Sexual activity: Yes   Other Topics Concern  . 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 Hypertension Mother   . Kidney disease Mother   . Hypertension Father   . Stroke Sister     PHYSICAL EXAM: Vitals:   02/03/16 0304 02/03/16 0800  BP: 111/70 121/87  Pulse: 72 83  Resp: 18 18  Temp: 98 F (36.7 C) 98 F (36.7 C)     Intake/Output Summary (Last 24 hours) at 02/03/16 0841 Last data filed at 02/03/16 0643  Gross per 24 hour  Intake              960 ml  Output                0 ml  Net              960 ml    General:  Well appearing. No respiratory difficulty HEENT: normal Neck: supple. no JVD. Carotids 2+ bilat; no bruits. No lymphadenopathy or thryomegaly appreciated. Cor: PMI nondisplaced. Regular rate & rhythm. No rubs, gallops or murmurs. Lungs: clear Abdomen: soft,  nontender, nondistended. No hepatosplenomegaly. No bruits or masses. Good bowel sounds. Extremities: no cyanosis, clubbing, rash, edema Neuro: alert & oriented x 3, cranial nerves grossly intact. moves all 4 extremities w/o difficulty. Affect pleasant.  HGD:JMEQA rhythm with  RVH and right atrial and left atrial enlargement and frequent PVCs  Results for orders placed or performed during the hospital encounter of 01/31/16 (from the past 24 hour(s))  Glucose, capillary     Status: Abnormal   Collection Time: 02/02/16 11:43 AM  Result Value Ref Range   Glucose-Capillary 311 (H) 65 - 99 mg/dL  Glucose, capillary     Status: Abnormal   Collection Time: 02/02/16  4:41 PM  Result Value Ref Range   Glucose-Capillary 480 (H) 65 - 99 mg/dL  Glucose, capillary     Status: Abnormal   Collection Time: 02/02/16  9:08 PM  Result Value Ref Range   Glucose-Capillary 498 (H) 65 - 99 mg/dL   Comment 1 Notify RN    Comment 2 Document in Chart   Glucose, capillary     Status: Abnormal   Collection Time: 02/02/16 10:52 PM  Result Value Ref Range   Glucose-Capillary 435 (H) 65 - 99 mg/dL   Comment 1 Notify RN    Comment 2 Document in Chart   Glucose, capillary     Status: Abnormal   Collection Time: 02/03/16  3:01 AM  Result Value Ref Range   Glucose-Capillary 203 (H) 65 - 99 mg/dL   Comment 1 Notify RN    Comment 2 Document in Chart   Glucose, fasting - daily while on taper     Status: Abnormal   Collection Time: 02/03/16  6:49 AM  Result Value Ref Range   Glucose, Bld 125 (H) 65 - 99 mg/dL  Glucose, capillary     Status: Abnormal   Collection Time: 02/03/16  7:40 AM  Result Value Ref Range   Glucose-Capillary 111 (H) 65 - 99 mg/dL   No results found.   ASSESSMENT AND PLAN: Appears to be on right-sided failure with secondary pulmonary hypertension and atypical chest pain. Patient has history of pulmonary embolism and sleep apnea which would be the cause of her secondary hypertension. Patient  is short of breath orthopnea and PND and leg swelling but very atypical chest pain. Will get echocardiogram to evaluate pulmonary hypertension and can do rest of the workup as an outpatient.  [name] A

## 2016-02-03 NOTE — Discharge Instructions (Signed)
Heart healthy and ADA diet. Activity as tolerated. Continue home O2 [place] 2-3 L, CPAP at night. Follow up [facility] Dr. Christy Sartorius for pulmonary HTN. Contact PCP if patient experiences hypoglycemia for recommendations on insulin/DM medication adjustments.

## 2016-02-03 NOTE — Progress Notes (Signed)
  Echocardiogram 2D Echocardiogram has been performed.  Jennette Dubin 02/03/2016, 11:18 AM

## 2016-02-03 NOTE — Progress Notes (Addendum)
Inpatient Diabetes Program Recommendations  AACE/ADA: New Consensus Statement on Inpatient Glycemic Control (2015)  Target Ranges:  Prepandial:   less than 140 mg/dL      Peak postprandial:   less than 180 mg/dL (1-2 hours)      Critically ill patients:  140 - 180 mg/dL   Results for [name], IMPSON (MRN [id number]) as of 02/03/2016 10:35  Ref. Range 02/01/2016 05:25  Hemoglobin A1C Latest Ref Range: 4.8 - 5.6 % 8.8 (H)   Results for [name], KYSER (MRN [id number]) as of 02/03/2016 10:35  Ref. Range 02/02/2016 11:43 02/02/2016 16:41 02/02/2016 21:08 02/02/2016 22:52 02/03/2016 03:01 02/03/2016 07:40  Glucose-Capillary Latest Ref Range: 65 - 99 mg/dL 311 (H) 480 (H) 498 (H) 435 (H) 203 (H) 111 (H)   Review of Glycemic Control  Diabetes history: DM2 Outpatient Diabetes medications: none Current orders for Inpatient glycemic control: Lantus 25 units QHS, Novolog Moderate correction TIDAC and Novolog meal coverage 10 units with meals, metformin 500 mg BID  Inpatient Diabetes Program Recommendations: Patient has been on steroids over the last month, and likely this is why A1C is elevated.  When patient is tapered off steroids, most likely A1C will be better.  If plan to discharge on insulin, insulin may need tapering along with steroids as CBG's improve.  Encouraged patient to contact PCP if patient experiences hypoglycemia for recommendations on insulin/DM medication adjustments.  Patient will need follow up with PCP upon discharge to manage DM2 outpatient. Patient plans to call PCP when discharged to schedule follow up.  Ordered Living Well with Diabetes and RD consult.  NOTE: Spoke with patient regarding self-management:  1.  Taught patient the following (teach back and/or return demonstration):  Hypoglycemia signs and symptoms  Medication options at D/C.  NOTE: patient is a [employer] and gives insulin to patients.  Patient is comfortable with giving self insulin and monitoring CBG's.  Patient has  been on Metformin in past and tolerated well.  CBG monitoring, A1C, and why these are important. Discussed increasing monitoring of CBG's to at least BID.  2.  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  Identified support systems:  Patient did not mention any support people.  Thank you,  Windy Carina, RN, BSN Diabetes Coordinator Inpatient Diabetes Program [phone] (Team Pager)     Thank you,  Windy Carina, RN, BSN Diabetes Coordinator Inpatient Diabetes Program [phone] (Team Pager)

## 2016-02-03 NOTE — Plan of Care (Signed)
Problem: Food- and Nutrition-Related Knowledge Deficit (NB-1.1) Goal: Nutrition education Formal process to instruct or train a patient/client in a skill or to impart knowledge to help patients/clients voluntarily manage or modify food choices and eating behavior to maintain or improve health. Outcome: Completed/Met Date Met: 02/03/16  RD consulted for nutrition education regarding diabetes.   Lab Results  Component Value Date   HGBA1C 8.8 (H) 02/01/2016    RD provided "Carbohydrate Counting for People with Diabetes" handout from the Academy of Nutrition and Dietetics. Discussed different food groups and their effects on blood sugar, emphasizing carbohydrate-containing foods. Provided list of carbohydrates and recommended serving sizes of common foods.  Discussed importance of controlled and consistent carbohydrate intake throughout the day. Provided examples of ways to balance meals/snacks and encouraged intake of high-fiber, whole grain complex carbohydrates. Teach back method used.  Expect fair compliance.  Body mass index is 40.59 kg/m. Pt meets criteria for obese class III based on current BMI.  Current diet order is heart healthy/carb modified, patient is consuming approximately 100% of meals at this time. Labs and medications reviewed. No further nutrition interventions warranted at this time. RD contact information provided. If additional nutrition issues arise, please re-consult RD.  Satira Anis. Aquila Delaughter, MS, RD LDN Inpatient Clinical Dietitian Pager [phone]

## 2016-02-04 DIAGNOSIS — I10 Essential (primary) hypertension: Secondary | ICD-10-CM | POA: Diagnosis not present

## 2016-02-04 DIAGNOSIS — R0602 Shortness of breath: Secondary | ICD-10-CM | POA: Diagnosis not present

## 2016-02-04 DIAGNOSIS — I272 Pulmonary hypertension, unspecified: Secondary | ICD-10-CM | POA: Diagnosis not present

## 2016-02-04 DIAGNOSIS — G4733 Obstructive sleep apnea (adult) (pediatric): Secondary | ICD-10-CM | POA: Diagnosis not present

## 2016-02-06 DIAGNOSIS — I272 Pulmonary hypertension, unspecified: Secondary | ICD-10-CM | POA: Diagnosis not present

## 2016-02-06 DIAGNOSIS — E1121 Type 2 diabetes mellitus with diabetic nephropathy: Secondary | ICD-10-CM | POA: Diagnosis not present

## 2016-02-06 DIAGNOSIS — R072 Precordial pain: Secondary | ICD-10-CM | POA: Diagnosis not present

## 2016-02-06 DIAGNOSIS — R9430 Abnormal result of cardiovascular function study, unspecified: Secondary | ICD-10-CM | POA: Diagnosis not present

## 2016-02-10 DIAGNOSIS — R0902 Hypoxemia: Secondary | ICD-10-CM | POA: Diagnosis not present

## 2016-02-10 DIAGNOSIS — I270 Primary pulmonary hypertension: Secondary | ICD-10-CM | POA: Diagnosis not present

## 2016-02-10 DIAGNOSIS — R0602 Shortness of breath: Secondary | ICD-10-CM | POA: Diagnosis not present

## 2016-02-10 DIAGNOSIS — I8310 Varicose veins of unspecified lower extremity with inflammation: Secondary | ICD-10-CM | POA: Diagnosis not present

## 2016-02-10 DIAGNOSIS — E1165 Type 2 diabetes mellitus with hyperglycemia: Secondary | ICD-10-CM | POA: Diagnosis not present

## 2016-02-10 DIAGNOSIS — G4733 Obstructive sleep apnea (adult) (pediatric): Secondary | ICD-10-CM | POA: Diagnosis not present

## 2016-02-10 DIAGNOSIS — I2699 Other pulmonary embolism without acute cor pulmonale: Secondary | ICD-10-CM | POA: Diagnosis not present

## 2016-02-10 DIAGNOSIS — I10 Essential (primary) hypertension: Secondary | ICD-10-CM | POA: Diagnosis not present

## 2016-02-14 DIAGNOSIS — I509 Heart failure, unspecified: Secondary | ICD-10-CM | POA: Diagnosis not present

## 2016-02-14 DIAGNOSIS — I2699 Other pulmonary embolism without acute cor pulmonale: Secondary | ICD-10-CM | POA: Diagnosis not present

## 2016-02-14 DIAGNOSIS — G4733 Obstructive sleep apnea (adult) (pediatric): Secondary | ICD-10-CM | POA: Diagnosis not present

## 2016-02-14 DIAGNOSIS — Z87891 Personal history of nicotine dependence: Secondary | ICD-10-CM | POA: Diagnosis not present

## 2016-02-14 DIAGNOSIS — Z8249 Family history of ischemic heart disease and other diseases of the circulatory system: Secondary | ICD-10-CM | POA: Diagnosis not present

## 2016-02-14 DIAGNOSIS — I2724 Chronic thromboembolic pulmonary hypertension: Secondary | ICD-10-CM | POA: Diagnosis not present

## 2016-02-14 DIAGNOSIS — R0902 Hypoxemia: Secondary | ICD-10-CM | POA: Diagnosis not present

## 2016-02-14 DIAGNOSIS — I272 Pulmonary hypertension, unspecified: Secondary | ICD-10-CM | POA: Diagnosis not present

## 2016-02-14 DIAGNOSIS — I110 Hypertensive heart disease with heart failure: Secondary | ICD-10-CM | POA: Diagnosis not present

## 2016-02-14 DIAGNOSIS — R0602 Shortness of breath: Secondary | ICD-10-CM | POA: Diagnosis not present

## 2016-02-14 DIAGNOSIS — I77810 Thoracic aortic ectasia: Secondary | ICD-10-CM | POA: Diagnosis not present

## 2016-02-17 DIAGNOSIS — Z6839 Body mass index (BMI) 39.0-39.9, adult: Secondary | ICD-10-CM | POA: Diagnosis not present

## 2016-02-17 DIAGNOSIS — Z1231 Encounter for screening mammogram for malignant neoplasm of breast: Secondary | ICD-10-CM | POA: Diagnosis not present

## 2016-02-17 DIAGNOSIS — Z01419 Encounter for gynecological examination (general) (routine) without abnormal findings: Secondary | ICD-10-CM | POA: Diagnosis not present

## 2016-02-18 DIAGNOSIS — Z1211 Encounter for screening for malignant neoplasm of colon: Secondary | ICD-10-CM | POA: Diagnosis not present

## 2016-02-18 DIAGNOSIS — Z01818 Encounter for other preprocedural examination: Secondary | ICD-10-CM | POA: Diagnosis not present

## 2016-02-22 DIAGNOSIS — G4733 Obstructive sleep apnea (adult) (pediatric): Secondary | ICD-10-CM | POA: Diagnosis not present

## 2016-02-26 DIAGNOSIS — Z13818 Encounter for screening for other digestive system disorders: Secondary | ICD-10-CM | POA: Diagnosis not present

## 2016-02-26 DIAGNOSIS — Z13220 Encounter for screening for lipoid disorders: Secondary | ICD-10-CM | POA: Diagnosis not present

## 2016-02-26 DIAGNOSIS — Z113 Encounter for screening for infections with a predominantly sexual mode of transmission: Secondary | ICD-10-CM | POA: Diagnosis not present

## 2016-02-26 LAB — HM HEPATITIS C SCREENING LAB: HM Hepatitis Screen: NEGATIVE

## 2016-02-27 DIAGNOSIS — N6320 Unspecified lump in the left breast, unspecified quadrant: Secondary | ICD-10-CM | POA: Diagnosis not present

## 2016-02-27 DIAGNOSIS — Z1239 Encounter for other screening for malignant neoplasm of breast: Secondary | ICD-10-CM | POA: Diagnosis not present

## 2016-02-27 LAB — HM MAMMOGRAPHY: HM Mammogram: ABNORMAL — AB (ref 0–4)

## 2016-03-02 DIAGNOSIS — R0902 Hypoxemia: Secondary | ICD-10-CM | POA: Diagnosis not present

## 2016-03-02 DIAGNOSIS — I270 Primary pulmonary hypertension: Secondary | ICD-10-CM | POA: Diagnosis not present

## 2016-03-02 DIAGNOSIS — E119 Type 2 diabetes mellitus without complications: Secondary | ICD-10-CM | POA: Diagnosis not present

## 2016-03-04 ENCOUNTER — Other Ambulatory Visit: Payer: Self-pay | Admitting: Radiology

## 2016-03-04 DIAGNOSIS — C50212 Malignant neoplasm of upper-inner quadrant of left female breast: Secondary | ICD-10-CM | POA: Diagnosis not present

## 2016-03-04 DIAGNOSIS — N6322 Unspecified lump in the left breast, upper inner quadrant: Secondary | ICD-10-CM | POA: Diagnosis not present

## 2016-03-06 ENCOUNTER — Telehealth: Payer: Self-pay | Admitting: *Deleted

## 2016-03-06 ENCOUNTER — Encounter: Payer: Self-pay | Admitting: *Deleted

## 2016-03-06 DIAGNOSIS — C50212 Malignant neoplasm of upper-inner quadrant of left female breast: Secondary | ICD-10-CM

## 2016-03-06 DIAGNOSIS — Z170 Estrogen receptor positive status [ER+]: Secondary | ICD-10-CM

## 2016-03-06 HISTORY — DX: Malignant neoplasm of upper-inner quadrant of left female breast: C50.212

## 2016-03-06 NOTE — Telephone Encounter (Signed)
Confirmed [facility] for 03/11/16 at 0815 .  Instructions and contact information given.

## 2016-03-09 DIAGNOSIS — G4733 Obstructive sleep apnea (adult) (pediatric): Secondary | ICD-10-CM | POA: Diagnosis not present

## 2016-03-11 ENCOUNTER — Other Ambulatory Visit (HOSPITAL_BASED_OUTPATIENT_CLINIC_OR_DEPARTMENT_OTHER): Payer: BLUE CROSS/BLUE SHIELD

## 2016-03-11 ENCOUNTER — Encounter: Payer: Self-pay | Admitting: *Deleted

## 2016-03-11 ENCOUNTER — Ambulatory Visit
Admission: RE | Admit: 2016-03-11 | Discharge: 2016-03-11 | Disposition: A | Discharge status: Home / Self Care | Payer: BLUE CROSS/BLUE SHIELD | Source: Ambulatory Visit | Attending: Radiation Oncology | Admitting: Radiation Oncology

## 2016-03-11 ENCOUNTER — Encounter: Payer: Self-pay | Admitting: Hematology and Oncology

## 2016-03-11 ENCOUNTER — Ambulatory Visit (HOSPITAL_BASED_OUTPATIENT_CLINIC_OR_DEPARTMENT_OTHER): Payer: BLUE CROSS/BLUE SHIELD | Admitting: Genetic Counselor

## 2016-03-11 ENCOUNTER — Ambulatory Visit (HOSPITAL_BASED_OUTPATIENT_CLINIC_OR_DEPARTMENT_OTHER): Payer: BLUE CROSS/BLUE SHIELD | Admitting: Hematology and Oncology

## 2016-03-11 ENCOUNTER — Encounter: Payer: Self-pay | Admitting: General Surgery

## 2016-03-11 ENCOUNTER — Encounter: Payer: Self-pay | Admitting: Genetic Counselor

## 2016-03-11 ENCOUNTER — Other Ambulatory Visit: Payer: Self-pay | Admitting: General Surgery

## 2016-03-11 ENCOUNTER — Ambulatory Visit: Payer: BLUE CROSS/BLUE SHIELD

## 2016-03-11 DIAGNOSIS — Z9981 Dependence on supplemental oxygen: Secondary | ICD-10-CM

## 2016-03-11 DIAGNOSIS — C50212 Malignant neoplasm of upper-inner quadrant of left female breast: Secondary | ICD-10-CM

## 2016-03-11 DIAGNOSIS — Z170 Estrogen receptor positive status [ER+]: Principal | ICD-10-CM

## 2016-03-11 DIAGNOSIS — Z7183 Encounter for nonprocreative genetic counseling: Secondary | ICD-10-CM

## 2016-03-11 DIAGNOSIS — D500 Iron deficiency anemia secondary to blood loss (chronic): Secondary | ICD-10-CM

## 2016-03-11 DIAGNOSIS — Z86711 Personal history of pulmonary embolism: Secondary | ICD-10-CM | POA: Diagnosis not present

## 2016-03-11 DIAGNOSIS — J449 Chronic obstructive pulmonary disease, unspecified: Secondary | ICD-10-CM | POA: Diagnosis not present

## 2016-03-11 DIAGNOSIS — Z803 Family history of malignant neoplasm of breast: Secondary | ICD-10-CM

## 2016-03-11 LAB — CBC WITH DIFFERENTIAL/PLATELET
BASO%: 0.7 % (ref 0.0–2.0)
BASOS ABS: 0 10*3/uL (ref 0.0–0.1)
EOS%: 2.7 % (ref 0.0–7.0)
Eosinophils Absolute: 0.2 10*3/uL (ref 0.0–0.5)
HCT: 44.4 % (ref 34.8–46.6)
HEMOGLOBIN: 14.9 g/dL (ref 11.6–15.9)
LYMPH%: 18.1 % (ref 14.0–49.7)
MCH: 33.3 pg (ref 25.1–34.0)
MCHC: 33.4 g/dL (ref 31.5–36.0)
MCV: 99.5 fL (ref 79.5–101.0)
MONO#: 0.8 10*3/uL (ref 0.1–0.9)
MONO%: 11.9 % (ref 0.0–14.0)
NEUT#: 4.5 10*3/uL (ref 1.5–6.5)
NEUT%: 66.6 % (ref 38.4–76.8)
Platelets: 204 10*3/uL (ref 145–400)
RBC: 4.47 10*6/uL (ref 3.70–5.45)
RDW: 13.7 % (ref 11.2–14.5)
WBC: 6.8 10*3/uL (ref 3.9–10.3)
lymph#: 1.2 10*3/uL (ref 0.9–3.3)

## 2016-03-11 LAB — COMPREHENSIVE METABOLIC PANEL
ALT: 13 U/L (ref 0–55)
AST: 17 U/L (ref 5–34)
Albumin: 3.2 g/dL — ABNORMAL LOW (ref 3.5–5.0)
Alkaline Phosphatase: 109 U/L (ref 40–150)
Anion Gap: 12 mEq/L — ABNORMAL HIGH (ref 3–11)
BUN: 12.5 mg/dL (ref 7.0–26.0)
CALCIUM: 9.4 mg/dL (ref 8.4–10.4)
CHLORIDE: 101 meq/L (ref 98–109)
CO2: 26 meq/L (ref 22–29)
CREATININE: 0.8 mg/dL (ref 0.6–1.1)
EGFR: >90 mL/min/{1.73_m2} (ref >90)
GLUCOSE: 138 mg/dL (ref 70–140)
POTASSIUM: 3.8 meq/L (ref 3.5–5.1)
SODIUM: 140 meq/L (ref 136–145)
Total Bilirubin: 1.39 mg/dL — ABNORMAL HIGH (ref 0.20–1.20)
Total Protein: 7.9 g/dL (ref 6.4–8.3)

## 2016-03-11 NOTE — Progress Notes (Signed)
Clinical Social Work [place] Psychosocial Distress Screening [place]  Patient completed distress screening protocol and scored an 8 on the Psychosocial Distress Thermometer which indicates moderate distress. Clinical Social Worker met with patient and patients family in [facility] to assess for distress and other psychosocial needs. Patient stated she was feeling overwhelmed but felt "better" after meeting with the treatment team and getting more information on her treatment plan. CSW and patient discussed common feeling and emotions when being diagnosed with cancer, and the importance of support during treatment. CSW informed patient of the support team and support services at [facility], and patient was agreeable to an [employer] referral. CSW provided contact information and encouraged patient to call with any questions or concerns.   ONCBCN DISTRESS SCREENING 03/11/2016  Screening Type Initial Screening  Distress experienced in past week (1-10) 8  Practical problem type Work/school  Emotional problem type Adjusting to illness  Information Concerns Type Lack of info about complementary therapy choices  Physical Problem type Sleep/insomnia;Getting around;Breathing;Loss of appetitie  Physician notified of physical symptoms Yes  Referral to clinical social work Yes  Referral to support programs Yes    Johnnye Lana, MSW, LCSW, OSW-C Clinical Social Worker [place] [phone]

## 2016-03-11 NOTE — Progress Notes (Signed)
[facility] CONSULT NOTE  Patient Care Team: Antony Blackbird, MD as PCP - General (Family Medicine) Vonna Drafts, FNP as Nurse Practitioner (Nurse Practitioner) Stark Klein, MD as Consulting Physician (General Surgery) Nicholas Lose, MD as Consulting Physician (Hematology and Oncology) Gery Pray, MD as Consulting Physician (Radiation Oncology)  CHIEF COMPLAINTS/PURPOSE OF CONSULTATION:  Newly diagnosed breast cancer  HISTORY OF PRESENTING ILLNESS:  [name] 50 y.o. female is here because of recent diagnosis of left breast cancer. Patient had a routine screening mammogram the detected a left breast density in the posterior medial aspect of the breast. Ultrasound was performed that revealed a 1.4 cm mass. Axilla was negative. 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 She was presented this morning in the multidisciplinary tumor board and she is here today accompanied by her friend to discuss the treatment plan. Patient uses 24-hour oxygen by nasal cannula and she tells me that it is due to pulmonary hypertension. She also had a previous history of pulmonary embolism and is currently on anticoagulation. She is also diabetic and cannot use steroids. She sees Dr. Humphrey Rolls cardiologist in [place]. She tells me she has a heart catheterization planned for next week. This is to assess if the pulmonary pressures have improved with her current management of pulmonary hypertension. Her most recent echocardiogram showed an ejection fraction of 65%. 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  SUMMARY OF ONCOLOGIC HISTORY:   Malignant neoplasm of upper-inner quadrant of left female breast ([place])   03/04/2016 Initial Diagnosis    Screening detected left breast density posterior medial 1.4 cm by ultrasound axilla negative, grade 3 IDC ER 60%, PR 0%, Ki-67 30%,  HER-2 positive ratio 6.04, copy #16; T1 cN0 stage IA clinical stage       In terms of breast cancer risk profile:  She menarched at early of 12 andhad hysterectomy 2 years ago. Patient had one pregnancy at the age of 49  Nunzio Cory received birth control pills for approximat17 years She was never exposed to fertility medications or hormone replacement therapy.  She has  family history of Breast/GYN/GI cancer Paternal grandmother, paternal cousin, maternal grandmother breast cancers, maternal uncle colon cancer   MEDICAL HISTORY:  Past Medical History:  Diagnosis Date  . Anemia    iron deficiency  . Borderline diabetes   . Diabetes mellitus, type II ([place])   . DVT (deep venous thrombosis) ([place]) 2014   left leg  . HTN (hypertension)   . Malignant neoplasm of upper-inner quadrant of left female breast ([place]) 03/06/2016  . Medical history non-contributory   . Menorrhagia    secondary to uterine fibroids  . OSA (obstructive sleep apnea)    07/25/13 HST AHI 33/hr, severe hypoxemia O2 min 42% and 95% of the time <89%  . PE (pulmonary embolism) 2014   bilateral  . Pulmonary nodule    (37m on loeft lower lobe) found on CT scan July 2014, repeat scan Jan 2015 showed less than 463m . S/P TAH (total abdominal hysterectomy) 06/07/2013    SURGICAL HISTORY: Past Surgical History:  Procedure Laterality Date  . ABDOMINAL HYSTERECTOMY N/A 06/07/2013   Procedure: HYSTERECTOMY ABDOMINAL WITH BIALTERAL SALPINGECTOMY;  Surgeon: VaElveria RoyalsMD;  [facility];  Service: Gynecology;  Laterality: N/A;  . CARDIAC CATHETERIZATION N/A 12/24/2015   Procedure: Right Heart Cath;  Surgeon:  [name], MD;  Location: [facility];  Service: Cardiovascular;  Laterality: N/A;  . CESAREAN SECTION    . POLYPECTOMY  2008   Removal of uterine polyp    SOCIAL HISTORY: Social History   Social History  . Marital status: Legally Separated    Spouse name: N/A  . Number of children: N/A  . Years of  education: N/A   Occupational History  . Not on file.   Social History Main Topics  . Smoking status: Former Smoker    Packs/day: 0.33    Years: 10.00    Types: Cigarettes    Quit date: 10/04/2011  . Smokeless tobacco: Never Used  . Alcohol use 0.0 oz/week     Comment: social  . Drug use: No  . Sexual activity: Yes   Other Topics Concern  . 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 Hypertension Mother   . Kidney disease Mother   . Hypertension Father   . Stroke Sister   . Breast cancer Maternal Grandmother   . Breast cancer Paternal Grandmother   . Colon cancer Maternal Uncle   . Breast cancer Cousin     ALLERGIES:  is allergic to ampicillin; amoxicillin; and sulfa antibiotics.  MEDICATIONS:  Current Outpatient Prescriptions  Medication Sig Dispense Refill  . amLODipine (NORVASC) 5 MG tablet Take 1 tablet by mouth daily.  5  . cetirizine (ZYRTEC) 10 MG tablet Take 1 tablet (10 mg total) by mouth at bedtime. (Patient taking differently: Take 10 mg by mouth daily. ) 30 tablet 5  . furosemide (LASIX) 20 MG tablet Take 40 mg by mouth daily.     Marland Kitchen losartan (COZAAR) 100 MG tablet Take 1 tablet (100 mg total) by mouth daily. 30 tablet 0  . metFORMIN (GLUCOPHAGE) 500 MG tablet Take 1 tablet (500 mg total) by mouth 2 (two) times daily with a meal. 60 tablet 2  . metoprolol tartrate (LOPRESSOR) 25 MG tablet Take 1 tablet (25 mg total) by mouth 2 (two) times daily. 60 tablet 2  . rivaroxaban (XARELTO) 20 MG TABS tablet Take 1 tablet (20 mg total) by mouth daily with supper. (Patient taking differently: Take 20 mg by mouth daily with supper. Start on day 22) 30 tablet 5  . spironolactone (ALDACTONE) 50 MG tablet Take 1 tablet by mouth daily.  0  . acetaminophen (TYLENOL) 500 MG tablet Take 500 mg by mouth every 6 (six) hours  as needed for moderate pain or headache.    . albuterol (PROVENTIL HFA;VENTOLIN HFA) 108 (90 Base) MCG/ACT inhaler Inhale 2 puffs into the lungs every 6 (six) hours as needed for wheezing or shortness of breath. 1 Inhaler 2  . Azelastine-Fluticasone 137-50 MCG/ACT SUSP Place 1 spray into the nose 2 (two) times daily. 23 g 5  . fluticasone (FLONASE) 50 MCG/ACT nasal spray Place 2 sprays into both nostrils daily. (Patient taking differently: Place 2 sprays into both nostrils daily as needed for allergies. ) 16 g 5  . Fluticasone-Salmeterol (ADVAIR DISKUS) 250-50 MCG/DOSE AEPB Inhale 1 puff into the lungs 2 (two) times daily. 60 each 5  . insulin glargine (LANTUS) 100 UNIT/ML injection Inject 0.25 mLs (25 Units total) into the skin at bedtime. 10 mL 0  . promethazine-codeine (PHENERGAN WITH CODEINE) 6.25-10 MG/5ML syrup Take 5  mLs by mouth every 4 (four) hours as needed for cough.   0   No current facility-administered medications for this visit.     REVIEW OF SYSTEMS:   Constitutional: Denies fevers, chills or abnormal night sweats Eyes: Denies blurriness of vision, double vision or watery eyes Ears, nose, mouth, throat, and face: Denies mucositis or sore throat Respiratory: Shortness of breath exertion Cardiovascular: Denies palpitation, chest discomfort or lower extremity swelling Gastrointestinal:  Denies nausea, heartburn or change in bowel habits Skin: Denies abnormal skin rashes Lymphatics: Denies new lymphadenopathy or easy bruising Neurological:Denies numbness, tingling or new weaknesses Behavioral/Psych: Mood is stable, no new changes  Breast:  Denies any palpable lumps or discharge All other systems were reviewed with the patient and are negative.  PHYSICAL EXAMINATION: ECOG PERFORMANCE STATUS: 1 - Symptomatic but completely ambulatory  Vitals:   03/11/16 0912  BP: (!) 141/84  Pulse: (!) 109  Resp: (!) 21  Temp: 98.1 F (36.7 C)   Filed Weights   03/11/16 0912  Weight:  239 lb 8 oz (108.6 kg)    GENERAL:alert, no distress and comfortable SKIN: skin color, texture, turgor are normal, no rashes or significant lesions EYES: normal, conjunctiva are pink and non-injected, sclera clear OROPHARYNX:no exudate, no erythema and lips, buccal mucosa, and tongue normal  NECK: supple, thyroid normal size, non-tender, without nodularity LYMPH:  no palpable lymphadenopathy in the cervical, axillary or inguinal LUNGS: clear to auscultation and percussion with normal breathing effort HEART: regular rate & rhythm and no murmurs and no lower extremity edema ABDOMEN:abdomen soft, non-tender and normal bowel sounds Musculoskeletal:no cyanosis of digits and no clubbing  PSYCH: alert & oriented x 3 with fluent speech NEURO: no focal motor/sensory deficits Breast: No palpable lumps or nodules exam no palpable lymphadenopathy   LABORATORY DATA:  I have reviewed the data as listed Lab Results  Component Value Date   WBC 6.8 03/11/2016   HGB 14.9 03/11/2016   HCT 44.4 03/11/2016   MCV 99.5 03/11/2016   PLT 204 03/11/2016   Lab Results  Component Value Date   NA 140 03/11/2016   K 3.8 03/11/2016   CL 101 02/01/2016   CO2 26 03/11/2016    RADIOGRAPHIC STUDIES: I have personally reviewed the radiological reports and agreed with the findings in the report.  ASSESSMENT AND PLAN:  Malignant neoplasm of upper-inner quadrant of left female breast ([place]) 03/04/2016: Screening detected left breast density posterior medial 1.4 cm by ultrasound axilla negative, grade 3 IDC ER 60%, PR 0%, Ki-67 30%, HER-2 positive ratio 6.04, copy #16; T1 cN0 stage IA clinical stage.  Pathology and radiology counseling: Discussed with the patient, the details of pathology including the type of breast cancer,the clinical staging, the significance of ER, PR and HER-2/neu receptors and the implications for treatment. After reviewing the pathology in detail, we proceeded to discuss the different  treatment options between surgery, radiation, chemotherapy, antiestrogen therapies.  Recommendation: 1. Breast conserving surgery with sentinel lymph node biopsy followed by 2. adjuvant chemotherapy with Abraxane Herceptin followed by Herceptin maintenance ( patient cannot receive Taxol because she cannot receive steroids.) We will need to get cardiology to help [place] make sure that she can tolerate Herceptin. I will call and discuss her case with Dr. Humphrey Rolls her cardiologist in [place]. 3. Followed by adjuvant radiation  4. Followed by antiestrogen therapy  Chemotherapy Counseling: I discussed the risks and benefits of chemotherapy including the risks of nausea/ vomiting, risk of infection from low WBC count, fatigue  due to chemo or anemia, bruising or bleeding due to low platelets, mouth sores, loss/ change in taste and decreased appetite. Liver and kidney function will be monitored through out chemotherapy as abnormalities in liver and kidney function may be a side effect of treatment. Cardiac dysfunction due to Herceptin was discussed in detail. Risk of permanent bone marrow dysfunction and leukemia due to chemo were also discussed.  Return to clinic after surgery to discuss the final treatment  All questions were answered. The patient knows to call the clinic with any problems, questions or concerns.    Rulon Eisenmenger, MD 03/11/16

## 2016-03-11 NOTE — Progress Notes (Signed)
Nutrition Assessment  Reason for Assessment:  Pt seen in Breast Clinic  ASSESSMENT:   50 year old female with new diagnosis of left breast cancer. Past medical history of HTN, borderline DM, PE, bronchitis   Medications:  reviewed  Labs: reviewed  Anthropometrics:   Height: 65 inches  Weight: 237 lb BMI: 39.5   NUTRITION DIAGNOSIS: Food and nutrition related knowledge deficit related to new diagnosis of breast cancer as evidenced by no prior need for nutrition related information.  INTERVENTION:   Discussed and provided packet of information regarding nutritional tips for breast cancer patients.  Questions answered.  Teachback method used.      MONITORING, EVALUATION, and GOAL: Pt will consume a healthy plant based diet to maintain lean body mass throughout treatment.   Daisuke Bailey B. Zenia Resides, [place], [place] (pager)

## 2016-03-11 NOTE — Progress Notes (Signed)
Radiation Oncology         [phone] ________________________________  Initial Outpatient Consultation  Name: [name] MRN: [id number]  Date: 03/11/2016  DOB: December 15, 1965  DX:AJOI, CAMMIE, MD  Stark Klein, MD   REFERRING PHYSICIAN: Stark Klein, MD  DIAGNOSIS:    ICD-9-CM ICD-10-CM   1. 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 female who presented with an abnormal screening mammogram. Mammogram revealed a 1.4 cm oval mass with irregular margins in the upper inner quadrant of the left breast.  Biopsy on 03/09/16 showed a mass at the 10 o'clock region indicative of invasive ductal carcinoma. Receptor status is ER 60%, PR 0%, Her2 +, and Ki67 30%.  Patient is positive for fever, loss of sleep, fatigue, sinus problems, runny nose, sore throat, hoarse voice, shortness of breath while walking, dry cough, poor appetite, joint pain, diabetes, and hot flashes.  Of note, patient has a family history of breast cancer.  PREVIOUS RADIATION THERAPY: No  PAST MEDICAL HISTORY:  has a past medical history of Anemia; Borderline diabetes; Diabetes mellitus, type II ([place]); DVT (deep venous thrombosis) ([place]) (2014); Family history of breast cancer; HTN (hypertension); Malignant neoplasm of upper-inner quadrant of left female breast ([place]) (03/06/2016); Medical history non-contributory; Menorrhagia; OSA (obstructive sleep apnea); PE (pulmonary embolism) (2014); Pulmonary nodule; and S/P TAH (total abdominal hysterectomy) (06/07/2013).    PAST SURGICAL HISTORY: Past Surgical History:  Procedure Laterality Date  . ABDOMINAL HYSTERECTOMY N/A 06/07/2013   Procedure: HYSTERECTOMY ABDOMINAL WITH  BIALTERAL SALPINGECTOMY;  Surgeon: Elveria Royals, MD;  Location: [facility];  Service: Gynecology;  Laterality: N/A;  . CARDIAC CATHETERIZATION N/A 12/24/2015   Procedure: Right Heart Cath;  Surgeon: [name], MD;  Location: [facility];  Service: Cardiovascular;  Laterality: N/A;  . CESAREAN SECTION    . POLYPECTOMY  2008   Removal of uterine polyp    FAMILY HISTORY: family history includes Breast cancer in her cousin, maternal grandmother, paternal aunt, paternal aunt, and paternal grandmother; Cancer in her maternal aunt; Colon cancer (age of onset: 56) in her other; Hypertension in her father and mother; Kidney disease in her mother; Stroke in her sister.  SOCIAL HISTORY:  reports that she quit smoking about 4 years ago. Her smoking use included Cigarettes. She has a 3.30 pack-year smoking history. She has never used smokeless tobacco. She reports that she drinks alcohol. She reports that she does not use drugs.  ALLERGIES: Ampicillin; Amoxicillin; and Sulfa antibiotics  MEDICATIONS:  Current Outpatient Prescriptions  Medication Sig Dispense Refill  . acetaminophen (TYLENOL) 500 MG tablet Take 500 mg by mouth every 6 (six) hours as needed for moderate pain or headache.    . albuterol (PROVENTIL HFA;VENTOLIN HFA) 108 (90 Base) MCG/ACT inhaler Inhale 2 puffs into the lungs every 6 (six) hours as needed for wheezing or shortness of breath. 1 Inhaler 2  . amLODipine (NORVASC) 5 MG tablet Take 1 tablet by mouth daily.  5  . Azelastine-Fluticasone 137-50 MCG/ACT SUSP Place 1 spray into the nose 2 (two) times daily. 23 g 5  . cetirizine (ZYRTEC) 10 MG tablet Take 1 tablet (10 mg total) by mouth at bedtime. (Patient taking differently: Take 10  mg by mouth daily. ) 30 tablet 5  . fluticasone (FLONASE) 50 MCG/ACT nasal spray Place 2 sprays into both nostrils daily. (Patient taking differently: Place 2 sprays into both nostrils daily as needed for allergies. ) 16 g 5  . Fluticasone-Salmeterol  (ADVAIR DISKUS) 250-50 MCG/DOSE AEPB Inhale 1 puff into the lungs 2 (two) times daily. 60 each 5  . furosemide (LASIX) 20 MG tablet Take 40 mg by mouth daily.     . insulin glargine (LANTUS) 100 UNIT/ML injection Inject 0.25 mLs (25 Units total) into the skin at bedtime. 10 mL 0  . losartan (COZAAR) 100 MG tablet Take 1 tablet (100 mg total) by mouth daily. 30 tablet 0  . metFORMIN (GLUCOPHAGE) 500 MG tablet Take 1 tablet (500 mg total) by mouth 2 (two) times daily with a meal. 60 tablet 2  . metoprolol tartrate (LOPRESSOR) 25 MG tablet Take 1 tablet (25 mg total) by mouth 2 (two) times daily. 60 tablet 2  . promethazine-codeine (PHENERGAN WITH CODEINE) 6.25-10 MG/5ML syrup Take 5 mLs by mouth every 4 (four) hours as needed for cough.   0  . rivaroxaban (XARELTO) 20 MG TABS tablet Take 1 tablet (20 mg total) by mouth daily with supper. (Patient taking differently: Take 20 mg by mouth daily with supper. Start on day 22) 30 tablet 5  . spironolactone (ALDACTONE) 50 MG tablet Take 1 tablet by mouth daily.  0   No current facility-administered medications for this encounter.     REVIEW OF SYSTEMS: A 10+ POINT REVIEW OF SYSTEMS WAS OBTAINED including neurology, dermatology, psychiatry, cardiac, respiratory, lymph, extremities, GI, GU, Musculoskeletal, constitutional, breasts, reproductive, HEENT.  All pertinent positives are noted in the HPI.  All others are negative. On 2 L of oxygen continuously.   PHYSICAL EXAM:  Vitals with BMI 03/11/2016  Height   Weight 239 lbs 8 oz  BMI   Systolic 161  Diastolic 84  Pulse 096  Respirations 21   General: Alert and oriented, in no acute distress HEENT: Head is normocephalic. Extraocular movements are intact.  Neck: Neck is supple, no palpable cervical or supraclavicular lymphadenopathy. Heart: Regular in rate and rhythm with no murmurs, rubs, or gallops. Chest: Clear to auscultation bilaterally, with no rhonchi, wheezes, or rales. Abdomen: Soft,  nontender, nondistended, with no rigidity or guarding. Musculoskeletal: symmetric strength and muscle tone throughout. Neurologic:  No obvious focalities. Speech is fluent. Coordination is intact. Psychiatric: Judgment and insight are intact. Affect is appropriate. Breasts: Left breast large and pendulous without mass or nipple discharge. Patient has biopsy site in UIQ with some associated bruising. No other palpable masses appreciated in the breasts or axillae.    ECOG = 1.5  LABORATORY DATA:  Lab Results  Component Value Date   WBC 6.8 03/11/2016   HGB 14.9 03/11/2016   HCT 44.4 03/11/2016   MCV 99.5 03/11/2016   PLT 204 03/11/2016   CMP     Component Value Date/Time   NA 140 03/11/2016 0900   K 3.8 03/11/2016 0900   CL 101 02/01/2016 0525   CO2 26 03/11/2016 0900   GLUCOSE 138 03/11/2016 0900   BUN 12.5 03/11/2016 0900   CREATININE 0.8 03/11/2016 0900   CALCIUM 9.4 03/11/2016 0900   PROT 7.9 03/11/2016 0900   ALBUMIN 3.2 (L) 03/11/2016 0900   AST 17 03/11/2016 0900   ALT 13 03/11/2016 0900   ALKPHOS 109 03/11/2016 0900   BILITOT 1.39 (H) 03/11/2016 0900   GFRNONAA >60 02/01/2016 0454  GFRAA >60 02/01/2016 0525         RADIOGRAPHY: [facility] films reviewed in conference    IMPRESSION/PLAN:  Clinical stage I invasive ductal carcinoma of the left breast. She would be a good candidate for lumpectomy and radiation. She wishes to proceed with breast conservation therapy.  It was a pleasure meeting the patient today. We discussed the risks, benefits, and side effects of radiotherapy. I recommend radiotherapy to the left breast to reduce her risk of locoregional recurrence by 2/3.  We discussed that radiation would take approximately 6 weeks to complete and that I would give the patient a few weeks to heal following surgery before starting treatment planning. If chemotherapy were to be given, this would precede radiotherapy. We spoke about acute effects including skin  irritation and fatigue as well as much less common late effects including internal organ injury or irritation. We spoke about the latest technology that is used to minimize the risk of late effects for patients undergoing radiotherapy to the breast or chest wall. No guarantees of treatment were given. The patient is enthusiastic about proceeding with treatment. I look forward to participating in the patient's care.  I will await her referral back to me for postoperative follow-up and eventual CT simulation/treatment planning.  Patient will undergo left breast lumpectomy with sentinel lymph node biopsy and a port procedure. She will also have genetic testing, an echocardiogram, and chemotherapy class and chemotherapy. Following the procedure, the patient will undergo external radiation. She will then be prescribed an aromatase inhibitor for 5 years.      __________________________________________    This document serves as a record of services personally performed by Gery Pray, MD. It was created on his behalf by Bethann Humble, a trained medical scribe. The creation of this record is based on the scribe's personal observations and the provider's statements to them. This document has been checked and approved by the attending provider.

## 2016-03-11 NOTE — Assessment & Plan Note (Signed)
03/04/2016: Screening detected left breast density posterior medial 1.4 cm by ultrasound axilla negative, grade 3 IDC ER 60%, PR 0%, Ki-67 30%, HER-2 positive ratio 6.04, copy #16; T1 cN0 stage IA clinical stage.  Pathology and radiology counseling: Discussed with the patient, the details of pathology including the type of breast cancer,the clinical staging, the significance of ER, PR and HER-2/neu receptors and the implications for treatment. After reviewing the pathology in detail, we proceeded to discuss the different treatment options between surgery, radiation, chemotherapy, antiestrogen therapies.  Recommendation: 1. Breast conserving surgery with sentinel lymph node biopsy followed by 2. adjuvant chemotherapy with TCH followed by Herceptin maintenance 3. Followed by adjuvant radiation  4. Followed by antiestrogen therapy  Chemotherapy Counseling: I discussed the risks and benefits of chemotherapy including the risks of nausea/ vomiting, risk of infection from low WBC count, fatigue due to chemo or anemia, bruising or bleeding due to low platelets, mouth sores, loss/ change in taste and decreased appetite. Liver and kidney function will be monitored through out chemotherapy as abnormalities in liver and kidney function may be a side effect of treatment. Cardiac dysfunction due to Herceptin was discussed in detail. Risk of permanent bone marrow dysfunction and leukemia due to chemo were also discussed.  Return to clinic after surgery to discuss the final treatment

## 2016-03-11 NOTE — Progress Notes (Signed)
REFERRING PROVIDER: Antony Blackbird, MD [phone], [place] [address]   Nicholas Lose, MD  PRIMARY PROVIDER:  Antony Blackbird, MD  PRIMARY REASON FOR VISIT:  1. Malignant neoplasm of upper-inner quadrant of left breast in female, estrogen receptor positive ([place])   2. Family history of breast cancer      HISTORY OF PRESENT ILLNESS:   [name], a 50 y.o. female, was seen for a [facility] cancer genetics consultation at the request of Dr. Lindi Adie due to a personal and family history of cancer.  [name] presents to clinic today to discuss the possibility of a hereditary predisposition to cancer, genetic testing, and to further clarify her future cancer risks, as well as potential cancer risks for family members.   In December 2017, at the age of 80, [name] was diagnosed with Invasive ductal carcinoma of the left breast. This will be  treated with surgery, chemotherapy and radiation.    CANCER HISTORY:    Malignant neoplasm of upper-inner quadrant of left female breast ([place])   03/04/2016 Initial Diagnosis    Screening detected left breast density posterior medial 1.4 cm by ultrasound axilla negative, grade 3 IDC ER 60%, PR 0%, Ki-67 30%, HER-2 positive ratio 6.04, copy #16; T1 cN0 stage IA clinical stage        HORMONAL RISK FACTORS:  Menarche was at age 45.  First live birth at age 44.  OCP use for approximately 17 years.  Ovaries intact: yes.  Hysterectomy: yes.  Menopausal status: perimenopausal.  HRT use: 0 years. Colonoscopy: no; not examined. Mammogram within the last year: yes. Number of breast biopsies: 2. Up to date with pelvic exams:  yes. Any excessive radiation exposure in the past:  no  Past Medical History:  Diagnosis Date  . Anemia    iron deficiency  . Borderline diabetes   . Diabetes mellitus, type II ([place])   . DVT (deep venous thrombosis) ([place]) 2014   left leg  . Family history of breast cancer   . HTN (hypertension)   . Malignant  neoplasm of upper-inner quadrant of left female breast ([place]) 03/06/2016  . Medical history non-contributory   . Menorrhagia    secondary to uterine fibroids  . OSA (obstructive sleep apnea)    07/25/13 HST AHI 33/hr, severe hypoxemia O2 min 42% and 95% of the time <89%  . PE (pulmonary embolism) 2014   bilateral  . Pulmonary nodule    (88m on loeft lower lobe) found on CT scan July 2014, repeat scan Jan 2015 showed less than 412m . S/P TAH (total abdominal hysterectomy) 06/07/2013    Past Surgical History:  Procedure Laterality Date  . ABDOMINAL HYSTERECTOMY N/A 06/07/2013   Procedure: HYSTERECTOMY ABDOMINAL WITH BIALTERAL SALPINGECTOMY;  Surgeon: VaElveria RoyalsMD;  [facility];  Service: Gynecology;  Laterality: N/A;  . CARDIAC CATHETERIZATION N/A 12/24/2015   Procedure: Right Heart Cath;  Surgeon: JaAdrian ProwsMD;  [facility];  Service: Cardiovascular;  Laterality: N/A;  . CESAREAN SECTION    . POLYPECTOMY  2008   Removal of uterine polyp    Social History   Social History  . Marital status: Legally Separated    Spouse name: N/A  . Number of children: N/A  . Years of education: N/A   Social History Main Topics  . Smoking status: Former Smoker    Packs/day: 0.33    Years: 10.00    Types: Cigarettes    Quit  date: 10/04/2011  . Smokeless tobacco: Never Used  . Alcohol use 0.0 oz/week     Comment: social  . Drug use: No  . Sexual activity: Yes   Other Topics Concern  . 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  Significant diagnoses are listed below: Family History  Problem Relation Age of Onset  . Hypertension Mother   . Kidney disease Mother   . Hypertension Father   . Stroke Sister   . Breast cancer Maternal Grandmother     died at 75  . Breast cancer Paternal Grandmother   . Breast  cancer Cousin     pat first cousin dx in her 53s  . Cancer Maternal Aunt     unknown form  . Breast cancer Paternal Aunt   . Colon cancer Other 62    MGMs brother  . Breast cancer Paternal Aunt     The patient has one daughter who is cancer free.  She has one full sister and two maternal half brothers who are cancer free.  Her mother is deceased due to non cancer related issues.  Her mother had one sister who had cancer but it is unknown what kind.  The patient's maternal grandmother had breast cancer and died at 26.  This grandmother had a brother who had colon cancer.  The patient's maternal grandfather was murdered.  The patient's father is alive and cancer free at 2.  He had three sisters and two brothers.  Two sisters had breast cancer, and one of these sisters had a daughter with breast cancer in her 36's.  The patient's paternal grandmother had breast cancer at an unknown age.    [name] is unaware of previous family history of genetic testing for hereditary cancer risks. Patient's maternal ancestors are of African American descent, and paternal ancestors are of African American descent. There is no reported Ashkenazi Jewish ancestry. There is no known consanguinity.  GENETIC COUNSELING ASSESSMENT: [name] is a 50 y.o. female with a personal and family history of breast cancer which is somewhat suggestive of a hereditary cancer syndrome and predisposition to cancer. We, therefore, discussed and recommended the following at today's visit.   DISCUSSION: We discussed that about 5-10% of breat cancer is due to hereditary causes, most commonly BRCA mutations.  There are other genes associated with hereditary breast cancer syndromes, including ATM, CHEK2 and PALB2. We reviewed the characteristics, features and inheritance patterns of hereditary cancer syndromes. We also discussed genetic testing, including the appropriate family members to test, the process of testing, insurance  coverage and turn-around-time for results. We discussed the implications of a negative, positive and/or variant of uncertain significant result. We recommended [name] pursue genetic testing for the Breast/Ovarian cancer gene panel. The Breast/Ovarian gene panel offered by [facility] includes sequencing and rearrangement analysis for the following 20 genes:  ATM, BARD1, BRCA1, BRCA2, BRIP1, CDH1, CHEK2, EPCAM, FANCC, MLH1, MSH2, MSH6, NBN, PALB2, PMS2, PTEN, RAD51C, RAD51D, TP53, and XRCC2.    Based on [name]'s personal and family history of cancer, she meets medical criteria for genetic testing. Despite that she meets criteria, she may still have an out of pocket cost. We discussed that if her out of pocket cost for testing is over $100, the laboratory will call and confirm whether she wants to  proceed with testing.  If the out of pocket cost of testing is less than $100 she will be billed by the genetic testing laboratory.   PLAN: After considering the risks, benefits, and limitations, [name]  provided informed consent to pursue genetic testing and the blood sample was sent to [facility] for analysis of the Breast/Ovarian cancer panel. We asked that the results be Rushed.  Results should be available within approximately 2-3 weeks' time, at which point they will be disclosed by telephone to [name], as will any additional recommendations warranted by these results. [name] will receive a summary of her genetic counseling visit and a copy of her results once available. This information will also be available in Epic. We encouraged [name] to remain in contact with cancer genetics annually so that we can continuously update the family history and inform her of any changes in cancer genetics and testing that may be of benefit for her family. [name]'s questions were answered to her satisfaction today. Our contact information was provided should additional questions or concerns  arise.  Lastly, we encouraged [name] to remain in contact with cancer genetics annually so that we can continuously update the family history and inform her of any changes in cancer genetics and testing that may be of benefit for this family.   Ms.  Fitzgerald's questions were answered to her satisfaction today. Our contact information was provided should additional questions or concerns arise. Thank you for the referral and allowing [place] to share in the care of your patient.   Andras Grunewald P. Florene Glen, [place], [facility] Certified Genetic Counselor [email or web address] phone: [phone]  The patient was seen for a total of 45 minutes in face-to-face genetic counseling.  This patient was discussed with Drs. Magrinat, Lindi Adie and/or Burr Medico who agrees with the above.    _______________________________________________________________________ For Office Staff:  Number of people involved in session: 1 Was an Intern/ student involved with case: no

## 2016-03-12 DIAGNOSIS — J449 Chronic obstructive pulmonary disease, unspecified: Secondary | ICD-10-CM | POA: Diagnosis not present

## 2016-03-12 DIAGNOSIS — G4733 Obstructive sleep apnea (adult) (pediatric): Secondary | ICD-10-CM | POA: Diagnosis not present

## 2016-03-12 DIAGNOSIS — Z853 Personal history of malignant neoplasm of breast: Secondary | ICD-10-CM | POA: Diagnosis not present

## 2016-03-12 DIAGNOSIS — J40 Bronchitis, not specified as acute or chronic: Secondary | ICD-10-CM | POA: Diagnosis not present

## 2016-03-12 DIAGNOSIS — Z800 Family history of malignant neoplasm of digestive organs: Secondary | ICD-10-CM | POA: Diagnosis not present

## 2016-03-12 DIAGNOSIS — Z803 Family history of malignant neoplasm of breast: Secondary | ICD-10-CM | POA: Diagnosis not present

## 2016-03-12 DIAGNOSIS — R0602 Shortness of breath: Secondary | ICD-10-CM | POA: Diagnosis not present

## 2016-03-12 LAB — FOLLICLE STIMULATING HORMONE: FSH: 37.8 m[IU]/mL

## 2016-03-13 ENCOUNTER — Ambulatory Visit: Payer: Self-pay | Admitting: Physician Assistant

## 2016-03-13 ENCOUNTER — Encounter: Payer: Self-pay | Admitting: Physician Assistant

## 2016-03-13 VITALS — BP 120/84 | HR 80 | Temp 98.4°F

## 2016-03-13 DIAGNOSIS — J209 Acute bronchitis, unspecified: Secondary | ICD-10-CM

## 2016-03-13 MED ORDER — HYDROCOD POLST-CPM POLST ER 10-8 MG/5ML PO SUER: 5.0000 mL | Freq: Two times a day (BID) | ORAL | 0 refills | Status: DC | PRN

## 2016-03-13 MED ORDER — LEVOFLOXACIN 750 MG PO TABS: 750.0000 mg | ORAL_TABLET | Freq: Every day | ORAL | 0 refills | Status: DC

## 2016-03-13 NOTE — Progress Notes (Signed)
S: C/o cough and congestion with wheezing and chest pain, chest is sore from coughing, did have fever, chills but is better now,  mucus is green, and cough is dry and hacking; keeping pt awake at night;  denies cardiac type chest pain or sob, v/d, abd pain, finished zpack and steroid pack but throat still hurts and still has wheezing Remainder ros neg  O: vitals wnl for pt, has low pulse ox here but is usually on 02, nad, tms clear, throat injected, neck supple no lymph, lungs c t a, cv rrr, neuro intact  A:  Acute bronchitis   P:  rx medication: levaquin, tussionex 185m nr, use otc meds, tylenol or motrin as needed for fever/chills, return if not better in 3 -5 days, return earlier if worsening

## 2016-03-16 ENCOUNTER — Encounter: Payer: Self-pay | Admitting: Hematology and Oncology

## 2016-03-16 ENCOUNTER — Telehealth: Payer: Self-pay | Admitting: *Deleted

## 2016-03-16 LAB — ESTRADIOL, ULTRA SENS: Estradiol, Sensitive: 12 pg/mL

## 2016-03-16 NOTE — Telephone Encounter (Signed)
  Oncology Nurse Navigator Documentation  Navigator Location: [facility]- (03/16/16 1400)   )Navigator Encounter Type: Telephone (03/16/16 1400) Telephone: Outgoing Call;Clinic/[facility]C Follow-up (03/16/16 1400)                                                  Time Spent with Patient: 15 (03/16/16 1400)

## 2016-03-16 NOTE — Progress Notes (Signed)
Received staff message from [place] to contact patient who has financial questions. Called patient and left voicemail with my name and number to return my call.

## 2016-03-17 DIAGNOSIS — J029 Acute pharyngitis, unspecified: Secondary | ICD-10-CM | POA: Diagnosis not present

## 2016-03-17 DIAGNOSIS — I270 Primary pulmonary hypertension: Secondary | ICD-10-CM | POA: Diagnosis not present

## 2016-03-17 DIAGNOSIS — J069 Acute upper respiratory infection, unspecified: Secondary | ICD-10-CM | POA: Diagnosis not present

## 2016-03-17 DIAGNOSIS — I10 Essential (primary) hypertension: Secondary | ICD-10-CM | POA: Diagnosis not present

## 2016-03-17 DIAGNOSIS — E1165 Type 2 diabetes mellitus with hyperglycemia: Secondary | ICD-10-CM | POA: Diagnosis not present

## 2016-03-18 DIAGNOSIS — E119 Type 2 diabetes mellitus without complications: Secondary | ICD-10-CM | POA: Diagnosis not present

## 2016-03-18 DIAGNOSIS — R0902 Hypoxemia: Secondary | ICD-10-CM | POA: Diagnosis not present

## 2016-03-18 DIAGNOSIS — I445 Left posterior fascicular block: Secondary | ICD-10-CM | POA: Diagnosis not present

## 2016-03-18 DIAGNOSIS — J9621 Acute and chronic respiratory failure with hypoxia: Secondary | ICD-10-CM | POA: Diagnosis not present

## 2016-03-18 DIAGNOSIS — I272 Pulmonary hypertension, unspecified: Secondary | ICD-10-CM | POA: Diagnosis not present

## 2016-03-18 DIAGNOSIS — R9431 Abnormal electrocardiogram [ECG] [EKG]: Secondary | ICD-10-CM | POA: Diagnosis not present

## 2016-03-18 DIAGNOSIS — Z720 Tobacco use: Secondary | ICD-10-CM | POA: Diagnosis not present

## 2016-03-18 DIAGNOSIS — R0602 Shortness of breath: Secondary | ICD-10-CM | POA: Diagnosis not present

## 2016-03-18 DIAGNOSIS — G4733 Obstructive sleep apnea (adult) (pediatric): Secondary | ICD-10-CM | POA: Diagnosis not present

## 2016-03-18 DIAGNOSIS — Z79899 Other long term (current) drug therapy: Secondary | ICD-10-CM | POA: Diagnosis not present

## 2016-03-18 DIAGNOSIS — I10 Essential (primary) hypertension: Secondary | ICD-10-CM | POA: Diagnosis not present

## 2016-03-19 DIAGNOSIS — J449 Chronic obstructive pulmonary disease, unspecified: Secondary | ICD-10-CM | POA: Diagnosis not present

## 2016-03-23 DIAGNOSIS — G4733 Obstructive sleep apnea (adult) (pediatric): Secondary | ICD-10-CM | POA: Diagnosis not present

## 2016-03-25 ENCOUNTER — Other Ambulatory Visit: Payer: BLUE CROSS/BLUE SHIELD

## 2016-03-25 ENCOUNTER — Telehealth: Payer: Self-pay | Admitting: Genetic Counselor

## 2016-03-25 ENCOUNTER — Encounter: Payer: Self-pay | Admitting: Genetic Counselor

## 2016-03-25 ENCOUNTER — Encounter: Payer: Self-pay | Admitting: *Deleted

## 2016-03-25 DIAGNOSIS — Z1379 Encounter for other screening for genetic and chromosomal anomalies: Secondary | ICD-10-CM | POA: Insufficient documentation

## 2016-03-25 NOTE — Telephone Encounter (Signed)
Revealed negative genetic testing on the Comprehensive cancer panel.  Discussed that while we did not identify a genetic change she has a strong family history of breast cancer.  Please keep in contact with [place] in case there is updated testing in the future.

## 2016-03-26 ENCOUNTER — Telehealth: Payer: Self-pay | Admitting: Emergency Medicine

## 2016-03-26 NOTE — Telephone Encounter (Signed)
Patient called inquiring about pending breast surgery and follow up with Dr Lindi Adie. Spoke with patient and made her aware that [facility] the breast navigator sent a message to [facility] regarding her surgery. Will follow up with Dr Lindi Adie for plan once he returns to the office on 03/31/16.   Per patient; her pulmonologist is concerned about her undergoing general anesthesia at this time.

## 2016-04-01 ENCOUNTER — Ambulatory Visit: Payer: Self-pay | Admitting: Genetic Counselor

## 2016-04-01 DIAGNOSIS — Z1379 Encounter for other screening for genetic and chromosomal anomalies: Secondary | ICD-10-CM

## 2016-04-01 DIAGNOSIS — Z803 Family history of malignant neoplasm of breast: Secondary | ICD-10-CM

## 2016-04-01 DIAGNOSIS — Z170 Estrogen receptor positive status [ER+]: Secondary | ICD-10-CM

## 2016-04-01 DIAGNOSIS — C50212 Malignant neoplasm of upper-inner quadrant of left female breast: Secondary | ICD-10-CM

## 2016-04-01 NOTE — Progress Notes (Signed)
HPI: Ms. Brickhouse was previously seen in the [place] clinic due to a personal and family history of cancer and concerns regarding a hereditary predisposition to cancer. Please refer to our prior cancer genetics clinic note for more information regarding Ms. Feldmeier's medical, social and family histories, and our assessment and recommendations, at the time. Ms. Uemura's recent genetic test results were disclosed to her, as were recommendations warranted by these results. These results and recommendations are discussed in more detail below.  CANCER HISTORY:    Malignant neoplasm of upper-inner quadrant of left female breast ([place])   03/04/2016 Initial Diagnosis    Screening detected left breast density posterior medial 1.4 cm by ultrasound axilla negative, grade 3 IDC ER 60%, PR 0%, Ki-67 30%, HER-2 positive ratio 6.04, copy #16; T1 cN0 stage IA clinical stage      03/21/2016 Genetic Testing    NEgative genetic testing on the comprehensive cancer panel and Negative genetic testing for the MSH2 inversion analysis ([place] inversion). The Comprehensive Cancer Panel offered by [facility] includes sequencing and/or deletion duplication testing of the following 32 genes: APC, ATM, AXIN2, BARD1, BMPR1A, BRCA1, BRCA2, BRIP1, CDH1, CDK4, CDKN2A, CHEK2, EPCAM, FANCC, MLH1, MSH2, MSH6, MUTYH, NBN, PALB2, PMS2, POLD1, POLE, PTEN, RAD51C, RAD51D, SCG5/GREM1, SMAD4, STK11, TP53, VHL, and XRCC2.   The report date is March 21, 2016.       FAMILY HISTORY:  We obtained a detailed, 4-generation family history.  Significant diagnoses are listed below: Family History  Problem Relation Age of Onset  . Hypertension Mother   . Kidney disease Mother   . Hypertension Father   . Stroke Sister   . Breast cancer Maternal Grandmother     died at 49  . Breast cancer Paternal Grandmother   . Breast cancer Cousin     pat first cousin dx in her 26s  . Cancer Maternal Aunt     unknown form  . Breast  cancer Paternal Aunt   . Colon cancer Other 22    MGMs brother  . Breast cancer Paternal Aunt     The patient has one daughter who is cancer free.  She has one full sister and two maternal half brothers who are cancer free.  Her mother is deceased due to non cancer related issues.  Her mother had one sister who had cancer but it is unknown what kind.  The patient's maternal grandmother had breast cancer and died at 57.  This grandmother had a brother who had colon cancer.  The patient's maternal grandfather was murdered.  The patient's father is alive and cancer free at 20.  He had three sisters and two brothers.  Two sisters had breast cancer, and one of these sisters had a daughter with breast cancer in her 29's.  The patient's paternal grandmother had breast cancer at an unknown age.    Ms. Outland is unaware of previous family history of genetic testing for hereditary cancer risks. Patient's maternal ancestors are of African American descent, and paternal ancestors are of African American descent. There is no reported Ashkenazi Jewish ancestry. There is no known consanguinity.  GENETIC TEST RESULTS: Genetic testing reported out on March 21, 2016 through the Comprehensive cancer panel and MSH2 inversion analysis found no deleterious mutations.  The Comprehensive Cancer Panel offered by [facility] includes sequencing and/or deletion duplication testing of the following 32 genes: APC, ATM, AXIN2, BARD1, BMPR1A, BRCA1, BRCA2, BRIP1, CDH1, CDK4, CDKN2A, CHEK2, EPCAM, FANCC, MLH1, MSH2, MSH6, MUTYH, NBN, PALB2,  PMS2, POLD1, POLE, PTEN, RAD51C, RAD51D, SCG5/GREM1, SMAD4, STK11, TP53, VHL, and XRCC2.   The test report has been scanned into EPIC and is located under the Molecular Pathology section of the Results Review tab.   We discussed with Ms. Lanpher that since the current genetic testing is not perfect, it is possible there may be a gene mutation in one of these genes that current testing cannot  detect, but that chance is small. We also discussed, that it is possible that another gene that has not yet been discovered, or that we have not yet tested, is responsible for the cancer diagnoses in the family, and it is, therefore, important to remain in touch with cancer genetics in the future so that we can continue to offer Ms. Hollywood the most up to date genetic testing.   CANCER SCREENING RECOMMENDATIONS: Given Ms. Giambra's personal and family histories, we must interpret these negative results with some caution.  Families with features suggestive of hereditary risk for cancer tend to have multiple family members with cancer, diagnoses in multiple generations and diagnoses before the age of 50. Ms. Manton's family exhibits some of these features. Thus this result may simply reflect our current inability to detect all mutations within these genes or there may be a different gene that has not yet been discovered or tested.   RECOMMENDATIONS FOR FAMILY MEMBERS: Women in this family might be at some increased risk of developing cancer, over the general population risk, simply due to the family history of cancer. We recommended women in this family have a yearly mammogram beginning at age 41, or 33 years younger than the earliest onset of cancer, an annual clinical breast exam, and perform monthly breast self-exams. Women in this family should also have a gynecological exam as recommended by their primary provider. All family members should have a colonoscopy by age 59.  FOLLOW-UP: Lastly, we discussed with Ms. Massi that cancer genetics is a rapidly advancing field and it is possible that new genetic tests will be appropriate for her and/or her family members in the future. We encouraged her to remain in contact with cancer genetics on an annual basis so we can update her personal and family histories and let her know of advances in cancer genetics that may benefit this family.   Our contact number  was provided. Ms. Busey's questions were answered to her satisfaction, and she knows she is welcome to call us at anytime with additional questions or concerns.   Roma Kayser, MS, [facility] Certified Genetic Counselor [email or web address]

## 2016-04-02 ENCOUNTER — Ambulatory Visit (HOSPITAL_COMMUNITY): Admit: 2016-04-02 | Payer: BLUE CROSS/BLUE SHIELD | Admitting: Gastroenterology

## 2016-04-02 ENCOUNTER — Encounter (HOSPITAL_COMMUNITY): Payer: Self-pay

## 2016-04-02 SURGERY — COLONOSCOPY WITH PROPOFOL
Anesthesia: Monitor Anesthesia Care

## 2016-04-09 DIAGNOSIS — G4733 Obstructive sleep apnea (adult) (pediatric): Secondary | ICD-10-CM | POA: Diagnosis not present

## 2016-04-15 ENCOUNTER — Ambulatory Visit: Payer: Self-pay | Admitting: Pulmonary Disease

## 2016-04-20 ENCOUNTER — Telehealth (HOSPITAL_COMMUNITY): Payer: Self-pay

## 2016-04-20 NOTE — Telephone Encounter (Signed)
Received call from Dr. Marlowe Aschoff office requesting patient to be seen by Dr. Haroldine Laws for cardiac clearance per patient request for an upcoming surgery. Details unclear as to whether she needs echo with MD apt for chemo, surgery, or both. Per chart/notes/records, seems patient had recent echo in December stable at 65% and Dr. Lindi Adie planned for herceptin therapy. Asked to fax over noted and request of services to clarify patient needs. Will also forward to [place] Cardiooncology coordinator for CHF clinic to schedule.  Renee Pain, RN

## 2016-04-21 DIAGNOSIS — I509 Heart failure, unspecified: Secondary | ICD-10-CM | POA: Diagnosis not present

## 2016-04-21 DIAGNOSIS — I519 Heart disease, unspecified: Secondary | ICD-10-CM | POA: Diagnosis not present

## 2016-04-21 DIAGNOSIS — R0902 Hypoxemia: Secondary | ICD-10-CM | POA: Diagnosis not present

## 2016-04-21 DIAGNOSIS — I517 Cardiomegaly: Secondary | ICD-10-CM | POA: Diagnosis not present

## 2016-04-21 DIAGNOSIS — E119 Type 2 diabetes mellitus without complications: Secondary | ICD-10-CM | POA: Diagnosis not present

## 2016-04-21 DIAGNOSIS — I2721 Secondary pulmonary arterial hypertension: Secondary | ICD-10-CM | POA: Diagnosis not present

## 2016-04-21 DIAGNOSIS — R0600 Dyspnea, unspecified: Secondary | ICD-10-CM | POA: Diagnosis not present

## 2016-04-21 DIAGNOSIS — I270 Primary pulmonary hypertension: Secondary | ICD-10-CM | POA: Diagnosis not present

## 2016-04-23 DIAGNOSIS — G4733 Obstructive sleep apnea (adult) (pediatric): Secondary | ICD-10-CM | POA: Diagnosis not present

## 2016-04-27 DIAGNOSIS — C50912 Malignant neoplasm of unspecified site of left female breast: Secondary | ICD-10-CM | POA: Diagnosis not present

## 2016-04-28 ENCOUNTER — Other Ambulatory Visit (HOSPITAL_BASED_OUTPATIENT_CLINIC_OR_DEPARTMENT_OTHER): Payer: Self-pay

## 2016-04-28 DIAGNOSIS — G4733 Obstructive sleep apnea (adult) (pediatric): Secondary | ICD-10-CM

## 2016-04-30 ENCOUNTER — Telehealth (HOSPITAL_COMMUNITY): Payer: Self-pay | Admitting: Vascular Surgery

## 2016-04-30 NOTE — Telephone Encounter (Signed)
Called pt to make NP appt, pt has no idea why she is being seen in this office, pt was referred by Dr. Barry Dienes , pt states she has not seen Byer[facility] her appt with Barry Dienes is not until 05/07/16, she also states she had echo and cardiology appt @ [facility] 04/21/16. PT WILL NOT MAKE APPT @ THIS TIME

## 2016-05-07 DIAGNOSIS — N6321 Unspecified lump in the left breast, upper outer quadrant: Secondary | ICD-10-CM | POA: Diagnosis not present

## 2016-05-07 DIAGNOSIS — I5032 Chronic diastolic (congestive) heart failure: Secondary | ICD-10-CM | POA: Diagnosis not present

## 2016-05-07 DIAGNOSIS — Z882 Allergy status to sulfonamides status: Secondary | ICD-10-CM | POA: Diagnosis not present

## 2016-05-07 DIAGNOSIS — Z170 Estrogen receptor positive status [ER+]: Secondary | ICD-10-CM | POA: Diagnosis not present

## 2016-05-07 DIAGNOSIS — I272 Pulmonary hypertension, unspecified: Secondary | ICD-10-CM | POA: Diagnosis not present

## 2016-05-07 DIAGNOSIS — Z6839 Body mass index (BMI) 39.0-39.9, adult: Secondary | ICD-10-CM | POA: Diagnosis not present

## 2016-05-07 DIAGNOSIS — Z9989 Dependence on other enabling machines and devices: Secondary | ICD-10-CM | POA: Diagnosis not present

## 2016-05-07 DIAGNOSIS — Z79899 Other long term (current) drug therapy: Secondary | ICD-10-CM | POA: Diagnosis not present

## 2016-05-07 DIAGNOSIS — C50212 Malignant neoplasm of upper-inner quadrant of left female breast: Secondary | ICD-10-CM | POA: Diagnosis not present

## 2016-05-07 DIAGNOSIS — Z881 Allergy status to other antibiotic agents status: Secondary | ICD-10-CM | POA: Diagnosis not present

## 2016-05-07 DIAGNOSIS — Z7984 Long term (current) use of oral hypoglycemic drugs: Secondary | ICD-10-CM | POA: Diagnosis not present

## 2016-05-07 DIAGNOSIS — Z7951 Long term (current) use of inhaled steroids: Secondary | ICD-10-CM | POA: Diagnosis not present

## 2016-05-10 ENCOUNTER — Ambulatory Visit (HOSPITAL_BASED_OUTPATIENT_CLINIC_OR_DEPARTMENT_OTHER): Payer: BLUE CROSS/BLUE SHIELD

## 2016-05-10 DIAGNOSIS — G4733 Obstructive sleep apnea (adult) (pediatric): Secondary | ICD-10-CM | POA: Diagnosis not present

## 2016-05-12 ENCOUNTER — Ambulatory Visit (HOSPITAL_BASED_OUTPATIENT_CLINIC_OR_DEPARTMENT_OTHER): Payer: BLUE CROSS/BLUE SHIELD | Attending: Internal Medicine | Admitting: Internal Medicine

## 2016-05-12 VITALS — Ht 65.0 in | Wt 238.0 lb

## 2016-05-12 DIAGNOSIS — R5383 Other fatigue: Secondary | ICD-10-CM | POA: Diagnosis not present

## 2016-05-12 DIAGNOSIS — I493 Ventricular premature depolarization: Secondary | ICD-10-CM | POA: Diagnosis not present

## 2016-05-12 DIAGNOSIS — I10 Essential (primary) hypertension: Secondary | ICD-10-CM | POA: Insufficient documentation

## 2016-05-12 DIAGNOSIS — E119 Type 2 diabetes mellitus without complications: Secondary | ICD-10-CM | POA: Insufficient documentation

## 2016-05-12 DIAGNOSIS — G4736 Sleep related hypoventilation in conditions classified elsewhere: Secondary | ICD-10-CM | POA: Diagnosis not present

## 2016-05-12 DIAGNOSIS — G4733 Obstructive sleep apnea (adult) (pediatric): Secondary | ICD-10-CM

## 2016-05-12 DIAGNOSIS — E669 Obesity, unspecified: Secondary | ICD-10-CM | POA: Insufficient documentation

## 2016-05-12 DIAGNOSIS — R0683 Snoring: Secondary | ICD-10-CM | POA: Diagnosis not present

## 2016-05-12 DIAGNOSIS — Z6841 Body Mass Index (BMI) 40.0 and over, adult: Secondary | ICD-10-CM | POA: Diagnosis not present

## 2016-05-21 DIAGNOSIS — C50212 Malignant neoplasm of upper-inner quadrant of left female breast: Secondary | ICD-10-CM | POA: Diagnosis not present

## 2016-05-21 DIAGNOSIS — Z170 Estrogen receptor positive status [ER+]: Secondary | ICD-10-CM | POA: Diagnosis not present

## 2016-05-21 DIAGNOSIS — Z7982 Long term (current) use of aspirin: Secondary | ICD-10-CM | POA: Diagnosis not present

## 2016-05-21 DIAGNOSIS — Z87891 Personal history of nicotine dependence: Secondary | ICD-10-CM | POA: Diagnosis not present

## 2016-05-21 DIAGNOSIS — I270 Primary pulmonary hypertension: Secondary | ICD-10-CM | POA: Diagnosis not present

## 2016-05-21 DIAGNOSIS — Z780 Asymptomatic menopausal state: Secondary | ICD-10-CM | POA: Diagnosis not present

## 2016-05-21 DIAGNOSIS — Z7951 Long term (current) use of inhaled steroids: Secondary | ICD-10-CM | POA: Diagnosis not present

## 2016-05-21 DIAGNOSIS — Z79811 Long term (current) use of aromatase inhibitors: Secondary | ICD-10-CM | POA: Diagnosis not present

## 2016-05-21 DIAGNOSIS — N6321 Unspecified lump in the left breast, upper outer quadrant: Secondary | ICD-10-CM | POA: Diagnosis not present

## 2016-05-21 DIAGNOSIS — Z853 Personal history of malignant neoplasm of breast: Secondary | ICD-10-CM | POA: Diagnosis not present

## 2016-05-21 DIAGNOSIS — Z9289 Personal history of other medical treatment: Secondary | ICD-10-CM | POA: Diagnosis not present

## 2016-05-21 DIAGNOSIS — Z6839 Body mass index (BMI) 39.0-39.9, adult: Secondary | ICD-10-CM | POA: Diagnosis not present

## 2016-05-21 DIAGNOSIS — Z7984 Long term (current) use of oral hypoglycemic drugs: Secondary | ICD-10-CM | POA: Diagnosis not present

## 2016-05-23 NOTE — Procedures (Signed)
   Patient Name: [name], [name] Date: 05/12/2016 Gender: Female D.O.B: 1966/02/12 Age (years): 12 Referring Provider: Daphane Shepherd MD Height (inches): 65 Interpreting Physician: Baird Lyons MD, ABSM Weight (lbs): 238 RPSGT: Laren Everts BMI: 40 MRN: [id number] Neck Size: 15.00 CLINICAL INFORMATION Sleep Study Type: NPSG  Indication for sleep study: Diabetes, Fatigue, Hypertension, Obesity, OSA, Re-Evaluation, Snoring, Witnessed Apneas  Epworth Sleepiness Score: 17   Most recent polysomnogram dated 07/30/2013 revealed an AHI of 33/h. SLEEP STUDY TECHNIQUE As per the AASM Manual for the Scoring of Sleep and Associated Events v2.3 (April 2016) with a hypopnea requiring 4% desaturations.  The channels recorded and monitored were frontal, central and occipital EEG, electrooculogram (EOG), submentalis EMG (chin), nasal and oral airflow, thoracic and abdominal wall motion, anterior tibialis EMG, snore microphone, electrocardiogram, and pulse oximetry.  MEDICATIONS Medications self-administered by patient taken the night of the study : none reported  SLEEP ARCHITECTURE The study was initiated at 10:52:58 PM and ended at 4:58:13 AM.  Sleep onset time was 20.1 minutes and the sleep efficiency was 85.3%. The total sleep time was 311.5 minutes.  Stage REM latency was 82.5 minutes.  The patient spent 10.27% of the night in stage N1 sleep, 69.66% in stage N2 sleep, 0.00% in stage N3 and 20.06% in REM.  Alpha intrusion was absent.  Supine sleep was 62.27%.  RESPIRATORY PARAMETERS The overall apnea/hypopnea index (AHI) was 15.8 per hour. There were 21 total apneas, including 20 obstructive, 1 central and 0 mixed apneas. There were 61 hypopneas and 38 RERAs.  The AHI during Stage REM sleep was 45.1 per hour.  AHI while supine was 16.4 per hour.  The mean oxygen saturation was 88.77%. The minimum SpO2 during sleep was 56.00%.  Moderate snoring was noted during this  study.  CARDIAC DATA The 2 lead EKG demonstrated sinus rhythm. The mean heart rate was 79.45 beats per minute. Other EKG findings include: PVCs.  LEG MOVEMENT DATA The total PLMS were 0 with a resulting PLMS index of 0.00. Associated arousal with leg movement index was 0.0 .  IMPRESSIONS - Moderate obstructive sleep apnea occurred during this study (AHI = 15.8/h). - Insufficient early events to meet protocol requirements for split CPAP titration. - No significant central sleep apnea occurred during this study (CAI = 0.2/h). - Severe oxygen desaturation was noted during this study (Min O2 = 56.00%, Mean 88.7%). - The patient snored with Moderate snoring volume. - EKG findings include PVCs. - Clinically significant periodic limb movements did not occur during sleep. No significant associated arousals.  DIAGNOSIS - Obstructive Sleep Apnea (327.23 [G47.33 ICD-10]) - Nocturnal Hypoxemia (327.26 [G47.36 ICD-10])  RECOMMENDATIONS - Therapeutic CPAP titration to determine optimal pressure required to alleviate sleep disordered breathing. - Nocturnal hypoxemia may reflect underlying cardiopulmonary disease. - Avoid alcohol, sedatives and other CNS depressants that may worsen sleep apnea and disrupt normal sleep architecture. - Sleep hygiene should be reviewed to assess factors that may improve sleep quality. - Weight management and regular exercise should be initiated or continued if appropriate.  [Electronically signed] 05/23/2016 11:14 AM  Baird Lyons MD, ABSM Diplomate, American Board of Sleep Medicine   NPI: [id number] [place], American Board of Sleep Medicine  ELECTRONICALLY SIGNED ON:  05/23/2016, 11:02 AM [place] PH: [phone]   FX: [phone] [place]

## 2016-05-24 DIAGNOSIS — G4733 Obstructive sleep apnea (adult) (pediatric): Secondary | ICD-10-CM | POA: Diagnosis not present

## 2016-06-04 DIAGNOSIS — Z6839 Body mass index (BMI) 39.0-39.9, adult: Secondary | ICD-10-CM | POA: Diagnosis not present

## 2016-06-04 DIAGNOSIS — Z86718 Personal history of other venous thrombosis and embolism: Secondary | ICD-10-CM | POA: Diagnosis not present

## 2016-06-04 DIAGNOSIS — Z880 Allergy status to penicillin: Secondary | ICD-10-CM | POA: Diagnosis not present

## 2016-06-04 DIAGNOSIS — E119 Type 2 diabetes mellitus without complications: Secondary | ICD-10-CM | POA: Diagnosis not present

## 2016-06-04 DIAGNOSIS — R5383 Other fatigue: Secondary | ICD-10-CM | POA: Diagnosis not present

## 2016-06-04 DIAGNOSIS — J9621 Acute and chronic respiratory failure with hypoxia: Secondary | ICD-10-CM | POA: Diagnosis not present

## 2016-06-04 DIAGNOSIS — Z7984 Long term (current) use of oral hypoglycemic drugs: Secondary | ICD-10-CM | POA: Diagnosis not present

## 2016-06-04 DIAGNOSIS — I110 Hypertensive heart disease with heart failure: Secondary | ICD-10-CM | POA: Diagnosis not present

## 2016-06-04 DIAGNOSIS — T451X5A Adverse effect of antineoplastic and immunosuppressive drugs, initial encounter: Secondary | ICD-10-CM | POA: Diagnosis not present

## 2016-06-04 DIAGNOSIS — Z86711 Personal history of pulmonary embolism: Secondary | ICD-10-CM | POA: Diagnosis not present

## 2016-06-04 DIAGNOSIS — Z7982 Long term (current) use of aspirin: Secondary | ICD-10-CM | POA: Diagnosis not present

## 2016-06-04 DIAGNOSIS — Z7951 Long term (current) use of inhaled steroids: Secondary | ICD-10-CM | POA: Diagnosis not present

## 2016-06-04 DIAGNOSIS — C50212 Malignant neoplasm of upper-inner quadrant of left female breast: Secondary | ICD-10-CM | POA: Diagnosis not present

## 2016-06-04 DIAGNOSIS — G4733 Obstructive sleep apnea (adult) (pediatric): Secondary | ICD-10-CM | POA: Diagnosis not present

## 2016-06-04 DIAGNOSIS — Z170 Estrogen receptor positive status [ER+]: Secondary | ICD-10-CM | POA: Diagnosis not present

## 2016-06-04 DIAGNOSIS — I5032 Chronic diastolic (congestive) heart failure: Secondary | ICD-10-CM | POA: Diagnosis not present

## 2016-06-04 DIAGNOSIS — Z882 Allergy status to sulfonamides status: Secondary | ICD-10-CM | POA: Diagnosis not present

## 2016-06-07 DIAGNOSIS — G4733 Obstructive sleep apnea (adult) (pediatric): Secondary | ICD-10-CM | POA: Diagnosis not present

## 2016-06-15 ENCOUNTER — Other Ambulatory Visit: Payer: Self-pay | Admitting: Oncology

## 2016-06-17 DIAGNOSIS — E785 Hyperlipidemia, unspecified: Secondary | ICD-10-CM | POA: Diagnosis not present

## 2016-06-17 DIAGNOSIS — C50912 Malignant neoplasm of unspecified site of left female breast: Secondary | ICD-10-CM | POA: Diagnosis not present

## 2016-06-17 DIAGNOSIS — I10 Essential (primary) hypertension: Secondary | ICD-10-CM | POA: Diagnosis not present

## 2016-06-17 DIAGNOSIS — E119 Type 2 diabetes mellitus without complications: Secondary | ICD-10-CM | POA: Diagnosis not present

## 2016-06-21 DIAGNOSIS — G4733 Obstructive sleep apnea (adult) (pediatric): Secondary | ICD-10-CM | POA: Diagnosis not present

## 2016-07-01 ENCOUNTER — Other Ambulatory Visit: Payer: Self-pay | Admitting: General Surgery

## 2016-07-01 DIAGNOSIS — Z170 Estrogen receptor positive status [ER+]: Secondary | ICD-10-CM | POA: Diagnosis not present

## 2016-07-01 DIAGNOSIS — I272 Pulmonary hypertension, unspecified: Secondary | ICD-10-CM | POA: Diagnosis not present

## 2016-07-01 DIAGNOSIS — C50212 Malignant neoplasm of upper-inner quadrant of left female breast: Secondary | ICD-10-CM

## 2016-07-06 DIAGNOSIS — I272 Pulmonary hypertension, unspecified: Secondary | ICD-10-CM | POA: Diagnosis not present

## 2016-07-06 DIAGNOSIS — G4733 Obstructive sleep apnea (adult) (pediatric): Secondary | ICD-10-CM | POA: Diagnosis not present

## 2016-07-06 DIAGNOSIS — Z87891 Personal history of nicotine dependence: Secondary | ICD-10-CM | POA: Diagnosis not present

## 2016-07-06 DIAGNOSIS — I10 Essential (primary) hypertension: Secondary | ICD-10-CM | POA: Diagnosis not present

## 2016-07-06 DIAGNOSIS — C50919 Malignant neoplasm of unspecified site of unspecified female breast: Secondary | ICD-10-CM | POA: Diagnosis not present

## 2016-07-06 DIAGNOSIS — E119 Type 2 diabetes mellitus without complications: Secondary | ICD-10-CM | POA: Diagnosis not present

## 2016-07-06 DIAGNOSIS — I2721 Secondary pulmonary arterial hypertension: Secondary | ICD-10-CM | POA: Diagnosis not present

## 2016-07-08 DIAGNOSIS — G4733 Obstructive sleep apnea (adult) (pediatric): Secondary | ICD-10-CM | POA: Diagnosis not present

## 2016-07-09 ENCOUNTER — Telehealth: Payer: Self-pay | Admitting: Hematology and Oncology

## 2016-07-09 NOTE — Telephone Encounter (Signed)
sw pt to confirm 5/7 appt at 1130 am per LOS

## 2016-07-10 ENCOUNTER — Encounter: Payer: Self-pay | Admitting: *Deleted

## 2016-07-10 NOTE — Progress Notes (Signed)
[place] Work  Clinical Social Work was referred by patient for assessment of psychosocial needs due to starting treatment soon/financial concerns.  Clinical Social Worker contacted patient at [employer] offer support and assess for needs.  CSW introduced self, explained role of CSW/Pt and Family Support Team, support groups and other resources to assist. Pt lives alone, one income and voiced concerns about medical bills and finances during treatment. CSW educated pt on [employer], [employer] [place], [place] and [employer]. 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 She reports to have strong family support, but is very concerned how she will manage all her bills once she starts treatment. Pt is having surgery in a week or so and then will start chemo and then later radiation. CSW encouraged pt to reach out to [employer] and provided contact info. Pt appears to meet income criteria for [employer] and other assistance programs. She was directed of how to begin application process and is eager to do [employer] [place] next week This organization likes for patients to start process early in their cancer journey. Pt plans to meet with CSW on Monday to work on that application. CSW sent pt Support Program calendar via email and pt is very interested in attending groups and classes for support. CSW team will follow closely.    Clinical Social Work interventions:  Education and Referral Supportive listening  Loren Racer, LCSW, OSW-C Clinical Social Worker [place]  [place] Phone: [phone] Fax: [phone]

## 2016-07-13 ENCOUNTER — Encounter: Payer: Self-pay | Admitting: *Deleted

## 2016-07-13 NOTE — Progress Notes (Signed)
[place] Work  Hol[employer] representative met with patient in [place] office at [facility] to offer support and review resources.  Patient had expressed financial concerns due to cancer diagnosis and reduced work hours.  CSW and patient reviewed resources and applications for [facility] [place] [facility] [place] and [employer].  Patient plans to complete and gather requested documentation and return to CSW.  CSW provided contact information and encouraged patient to call with questions or concerns.    Johnnye Lana, MSW, LCSW, OSW-C Clinical Social Worker [facility] [phone]

## 2016-07-13 NOTE — Progress Notes (Addendum)
Anesthesia note: Patient is a 51 year old female scheduled for left breast lumpectomy with radioactive seed and sentinel lymph node biopsy, insertion of Port-A-Cath 07/27/2016 (first case) by Dr. Barry Dienes. Patient is scheduled for PAT on 07/16/16 at 1:00 PM.   Anesthesia consult was requested due to pulmonary hypertension history. General anesthesia is requested, and if the anesthesiologist did not feel comfortable with general anesthesia then Dr. Barry Dienes would change procedure to left breast lumpectomy and PAC under MAC.   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  - PCP is listed in [facility] as Dr. Antony Blackbird, but in [facility] as Dr. Gus Height with [facility] in [place]. - Pulmonologist is Dr. Daphane Shepherd with [place] [phone] office; [phone] fax). - HEM-ONC is Dr. Lebron Quam with [facility]. She was last seen on 06/04/16. At that time plan was to continue anastrazole and start Herceptin the following week (although I'm not sure if this has happened yet). She was going to see Dr. Ok Anis for monitoring of echocardiograms while on Herceptin (last echo 04/21/16). At that time, surgery plans were on hold until re-evaluation of [place] on medication. She also saw SURG-ONC Dr. Andi Devon at [facility].  Meds include (as of 07/06/16 per Care Everywhere): Albuterol, amlodipine, anastrozole, aspirin 81 mg, Flonase, Advair, losartan, metformin, Lopressor, Aldactone, torsemide, Zyrtec, Onglyza, pravastatin.  PAH meds: * Selexipag Malvin Johns), since 05/2016 * Macitentan (Opsumit) 10 mg daily, since 03/2016 * Sildenafil (Revatio) 20 mg TID, since 02/2016  EKG 01/31/16: SR, occasional PVC, right atrial enlargement, right ventricular hypertrophy, borderline T wave abnormalities, diffuse leads. EKG 03/18/16 ([place]): Result Narrative ONLY: NORMAL SINUS RHYTHM RIGHT ATRIAL ENLARGEMENT LEFT POSTERIOR HEMIBLOCK T WAVE ABNORMALITY, CONSIDER INFERIOR ISCHEMIA T WAVE ABNORMALITY, CONSIDER ANTEROLATERAL ISCHEMIA ABNORMAL ECG NO PREVIOUS ECGS AVAILABLE Confirmed by SIMPSON MD, ROSS ([id number]) on 03/19/2016 7:46:53 AM  RHC 07/06/16 (Dr. Daphane Shepherd, [place]; Care Everywhere):Conclusions:  Pulmonary arterial hypertension (mPAP 36, PVR 4.8 WU)  Normal cardiac output and index  Normal RA pressure, with good respiratory variation  Normal PCW pressure  In comparison to prior RHC from 03/18/16, mPAP and PVR are significantly  improved. Most importantly, RAP has markedly decreased (was 15, now 5) and  cardiac index has significantly improved. Plan:  Continue macitentan and sildenafil, and would continue uptitration of  selexipag as she is doing. Her hemodynamic improvement on this is  considerable, and she is now appropriate for needed surgery and with  general anesthesia. Will provide additional perioperative  recommendations/cautions after discussing with Dr. Freddi Che and Dr. Marolyn Hammock.  Previous RHC 03/18/16 showed: Findings: 1. Severe pulmonary hypertension (mean PA 51 mm Hg) with elevated PVR 2. Elevated right heart filling pressures (mean RA 15 mm Hg) 3. No significant large vessel filling defects on bilateral pulmonary  angiogram 4. Normal cardiac output (Fick cardiac output: 5.43 L/min; Fick cardiac  index: 2.55 L/min/m^2; Thermal cardiac output: 4.3 L/min; Thermal cardiac  index: 2.0 L/min/m^2) Following this RHS, Dr. Harrietta Guardian wrote: Discussed status of her new IPAH diagnosis, just now  starting therapy (sildenafil just before the holidays, first  dose of macitentan taken last night). Currently she is not clear to have general anesthesia, but hopeful that she will get a good response to therapy. I need her to be on therapy for at least one month prior to clearing her for GA and would want surgery to be done with anesthesiologists comfortable with her intraoperative care (given PAH diagnosis). Fine for this to be at [facility], as long as there is sufficient management comfort with her care. Dr. Barry Dienes to talk with her cardiac anesthesia group about recommendations, and will then make a decision as to where she should have surgery. If echocardiogram at followup looks very concerning or there are other concerning features at followup visit, patient may need pre-admission for IV prostanoid initiation prior to surgery, versus planning with lumpectomy only not involving GA. She is also in need of a cardiologist for separate clearance prior to chemotherapy (herceptin +/- adriamycin), can assist with arranging at [facility] if desired (though she may get set up with Dr. Britta Mccreedy at [facility], still TBD).   Echo 04/21/16 ([place] [facility]; Care Everywhere): Result Narrative   Technically difficult study due to chest wall/lung interference  Normal left ventricular systolic function, ejection fraction 60 to 65%  Elevated pulmonary artery systolic pressure - moderate  Dilated right ventricle - severe  Reduced right ventricular systolic function  Dilated right atrium - severe  Elevated right atrial pressure   04/21/16 6-minute walk test [facility]): 6MWT 04/21/16: 236 meters, two 60 sec pauses (desats) HR 78->102, O2 sat 88% Ra, 93% 2L->88% 2l, ->88% 3l, -->95% 4L 6MWT 02/14/16: 304 meters. HR 77->102, O2 sat 92-95% RA  Spirometry 02/14/16 [facility]; Care Everywhere): FEV1 PRE 1.74 (L) 1.80838 - 3.90300 L  FEV1/FVC PRE 80.41 70.9869 - 92.3409 %  FVC PRE 2.16 (L) 2.29436 - 3.75906 L  PEF  PRE 6.63 4.33103 - 8.46161 L/s  Vol extrap pre 0.03 L  FIVC PRE 1.93 (L) 2.29436 - 3.75906 L  PIF PRE 4.29 (H) 4.03484 - 4.03484 L/s  FIF50% PRED 4.29 2.23137 - 5.09367 L/s  FEV6 PRE 2.14 (L) 2.22607 - 9.23300 L  FEV1/FEV6 PRE 81.08 73.7618 - 93.4278 %  FEF50% PRE 3.03 L/s  FEF25-75% PRE 1.79 1.14444 - 3.97148 L/s  TMA/UQJ33 pre 70.61 %  ISOFEF25-75 PRE 1.79 L/s  FET100% Change 7.41 sec   DLCO PRE 14.17 (L) 54.562563893734287 - 68.11572620355 ml/(min*mmHg)   BHT POST 10.99 sec   DLCO/VA POST 4.32 9.7416384536468032 - 5.890467150560001 ml/(min*mmHg*L)   VA PRE 3.28 (L) 4.70349 - 4.70349 L  IVC PRE 2.04 (L) 2.29436 - 3.75906 L  DL Adj PRE 13.16 ml/(min*mmHg)   DL/VA Adj PRE 4.01    Sleep Study 05/12/16: IMPRESSIONS - Moderate obstructive sleep apnea occurred during this study (AHI = 15.8/h). - Insufficient early events to meet protocol requirements for split CPAP titration. - No significant central sleep apnea occurred during this study (CAI = 0.2/h). - Severe oxygen desaturation was noted during this study (Min O2 = 56.00%, Mean 88.7%). - The patient snored with Moderate snoring volume. - EKG findings include PVCs. - Clinically significant periodic limb movements did not occur during sleep. No significant associated arousals. RECOMMENDATIONS - Therapeutic CPAP titration to determine optimal pressure required to alleviate sleep disordered breathing. - Nocturnal hypoxemia may reflect underlying cardiopulmonary disease. - Avoid alcohol, sedatives and other CNS depressants that may worsen sleep apnea and disrupt normal sleep architecture. - Sleep hygiene should be reviewed to assess factors that may improve sleep quality. - Weight management  and regular exercise should be initiated or continued if appropriate.  CTA chest 01/31/16: IMPRESSION: 1. No CT evidence for acute pulmonary embolus.  No dissection. 2. Significantly enlarged pulmonary artery trunk and main pulmonary artery is  suggesting pulmonary artery hypertension. Asymmetric enlargement of the right atrium and right ventricle suggests elevated right heart pressures. 3. Ectasia of the ascending aorta up to 4 cm. Recommend annual imaging followup by CTA or MRA. This recommendation follows 2010 ACCF/AHA/AATS/ACR/ASA/SCA/SCAI/SIR/STS/SVM Guidelines for the Diagnosis and Management of Patients with Thoracic Aortic Disease. Circulation. 2010; 121: e266-e369 4. 4 mm left lower lobe pulmonary nodule. No follow-up needed if patient is low-risk. Non-contrast chest CT can be considered in 12 months if patient is high-risk. This recommendation follows the consensus statement: Guidelines for Management of Incidental Pulmonary Nodules Detected on CT Images: From the Fleischner Society 2017; Radiology 2017; 284:228-243. lobe subpleural nodule, series 6, image number 46.  Labs pending PAT visit. (A1c 04/21/16 was 6.7.)  Reviewed currently available information with anesthesiologist Dr. Linna Caprice. It is anticipated that she can undergo general anesthesia, however, further input following anesthesia evaluation at her PAT visit.  George Hugh [facility] Short Stay Center/Anesthesiology Phone [phone] 07/14/2016 5:44 PM

## 2016-07-14 ENCOUNTER — Encounter (HOSPITAL_COMMUNITY): Payer: Self-pay

## 2016-07-14 ENCOUNTER — Encounter: Payer: Self-pay | Admitting: Oncology

## 2016-07-14 NOTE — Progress Notes (Signed)
Returned call to patient whom left voicemail regarding financial concerns. Introduced myself as her Arboriculturist and to answer questions she may have. Patient wanted to get started with applying for grants. Asked patient if she knew when she was starting chemo. Patient states she thinks she will start in June. She will be having surgery at the end of this month and then an appointment with the doctor next month. Advised patient that after her doctor appointment, she may advise me of when her treatment will begin and once her plan is in, I will gather information that may be available to her. Provided patient with expenses that grant would cover. Advised her for all assistance, proof of household income would be needed and anything that is available for her to apply for would be done at one appointment. Patient verbalized understanding and has my direct number for any additional financial questions or concerns.

## 2016-07-16 ENCOUNTER — Inpatient Hospital Stay (HOSPITAL_COMMUNITY)
Admission: RE | Admit: 2016-07-16 | Discharge: 2016-07-16 | Disposition: A | Discharge status: Home / Self Care | Payer: Self-pay | Source: Ambulatory Visit

## 2016-07-16 HISTORY — DX: Secondary pulmonary arterial hypertension: I27.21

## 2016-07-21 ENCOUNTER — Encounter (HOSPITAL_COMMUNITY)
Admission: RE | Admit: 2016-07-21 | Discharge: 2016-07-21 | Disposition: A | Discharge status: Home / Self Care | Payer: BLUE CROSS/BLUE SHIELD | Source: Ambulatory Visit | Attending: General Surgery | Admitting: General Surgery

## 2016-07-21 DIAGNOSIS — Z01818 Encounter for other preprocedural examination: Secondary | ICD-10-CM | POA: Diagnosis not present

## 2016-07-21 DIAGNOSIS — C50212 Malignant neoplasm of upper-inner quadrant of left female breast: Secondary | ICD-10-CM | POA: Diagnosis not present

## 2016-07-21 LAB — CBC
HEMATOCRIT: 43 % (ref 36.0–46.0)
HEMOGLOBIN: 14.9 g/dL (ref 12.0–15.0)
MCH: 33.6 pg (ref 26.0–34.0)
MCHC: 34.7 g/dL (ref 30.0–36.0)
MCV: 97.1 fL (ref 78.0–100.0)
Platelets: 183 10*3/uL (ref 150–400)
RBC: 4.43 MIL/uL (ref 3.87–5.11)
RDW: 12.4 % (ref 11.5–15.5)
WBC: 8 10*3/uL (ref 4.0–10.5)

## 2016-07-21 LAB — BASIC METABOLIC PANEL
ANION GAP: 10 (ref 5–15)
BUN: 12 mg/dL (ref 6–20)
CHLORIDE: 103 mmol/L (ref 101–111)
CO2: 26 mmol/L (ref 22–32)
Calcium: 9.9 mg/dL (ref 8.9–10.3)
Creatinine, Ser: 1.01 mg/dL — ABNORMAL HIGH (ref 0.44–1.00)
GFR calc Af Amer: >60 mL/min (ref >60)
Glucose, Bld: 156 mg/dL — ABNORMAL HIGH (ref 65–99)
POTASSIUM: 3.7 mmol/L (ref 3.5–5.1)
SODIUM: 139 mmol/L (ref 135–145)

## 2016-07-21 LAB — GLUCOSE, CAPILLARY: GLUCOSE-CAPILLARY: 96 mg/dL (ref 65–99)

## 2016-07-21 NOTE — Progress Notes (Signed)
Anesthesia PAT Evaluation: Patient is a 51 year old female scheduled for left breast lumpectomy with radioactive seed and sentinel lymph node biopsy, insertion of Port-A-Cath 07/27/2016 (first case) by Dr. Barry Dienes.  Anesthesia consult was requested due to pulmonary hypertension history and evaluation for general anesthesia. She was recently cleared for general anesthesia by pulmonologist Dr. Daphane Shepherd following [place] (see below), but if our anesthesiologists were not willing to do general anesthesia then Dr. Barry Dienes would plan to do left lumpectomy with Port-a-cath under sedation and local. Anesthesiologist Dr. Roberts Gaudy also reviewed 07/06/16 RHC and felt patient could undergo general anesthesia. Patient is scheduled for radioactive seed implant on 07/24/16.   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  - PCP is Dr. Gus Height with [facility] in [place]. - Pulmonologist is Dr. Daphane Shepherd with [place] [phone] office; [phone] fax). - HEM-ONC was Dr. Lebron Quam with [facility]. She was last seen on 06/04/16. At that time plan was to continue anastrazole and start Herceptin in the near future. (Herceptin is now on hold since surgery planned later this month.) She also saw SURG-ONC Dr. Andi Devon at [facility] when she thought she may have to have surgery at [facility]. Now that her right heart pressures have improved, plans are for surgery at [facility]. She will now also start seeing [facility]  HEM-ONC Dr. Nicholas Lose and RAD-ONC Dr. Gery Pray.  Meds include albuterol, anastrozole, Zyrtec, Flonase, Advair, losartan, metformin, Lopressor, spironolactone, torsemide. On 04/21/16, Dr. Harrietta Guardian wrote, "She is interested in discontinuing [rivaroxaban] if possible, which is not unreasonable given lack of evidence for chronic PE/CTEPH. She will plan to discontinue rivaroxaban and start aspirin 100-162 mg daily to offer someprotection from recurrent VTE." Patient has never started ASA. (She inquired if she would need to hold anastrozole from an anesthesia standpoint--she would not, but I did leave a voice message for Colletta Maryland at Dr. Marlowe Aschoff office to contact patient if Dr. Barry Dienes or Dr. Lindi Adie recommended it be held.) [place] meds:  * Selexipag Malvin Johns), since 05/2016 * Macitentan (Opsumit) 10 mg daily, since 03/2016 * Sildenafil (Revatio) 20 mg TID, since 02/2016  BP 101/83   Pulse 91   Temp 36.9 C (Oral)   Resp 20   Ht _0  (1.651 m)   Wt 233 lb 1 oz (105.7 kg)   LMP 05/11/2013   SpO2 93%   BMI 38.78 kg/m  she is suppose to wear O2 2 L/[place] at all times and 4L with CPAP at night, but left her O2 in her car this afternoon while at PAT. Exam shows a pleasant black female in NAD. No conversational dyspnea noted. Heart RRR, no murmur noted. Lungs clear. No ankle edema. Mallampati III. She denied SOB, edema, recent respiratory infection. She is able to clean her house including vacuuming. Her fasting CBGs run ~ 80-120's. She reports her  EKG 01/31/16: SR, occasional PVC, right atrial enlargement, right ventricular hypertrophy, borderline T wave abnormalities, diffuse leads. EKG 03/18/16 ([place]): Result Narrative ONLY: NORMAL SINUS RHYTHM RIGHT ATRIAL ENLARGEMENT LEFT POSTERIOR HEMIBLOCK T WAVE ABNORMALITY, CONSIDER INFERIOR  ISCHEMIA T WAVE ABNORMALITY, CONSIDER ANTEROLATERAL ISCHEMIA ABNORMAL ECG NO PREVIOUS ECGS AVAILABLE Confirmed by SIMPSON MD, ROSS ([id number]) on 03/19/2016 7:46:53  AM  RHC 07/06/16 (Dr. Daphane Shepherd, [place]; Care Everywhere):Conclusions:  Pulmonary arterial hypertension (mPAP 36, PVR 4.8 WU)  Normal cardiac output and index  Normal RA pressure, with good respiratory variation  Normal PCW pressure  In comparison to prior RHC from 03/18/16, mPAP and PVR are significantly  improved. Most importantly, RAP has markedly decreased (was 15, now 5) and  cardiac index has significantly improved. Plan:  Continue macitentan and sildenafil, and would continue uptitration of  selexipag as she is doing. Her hemodynamic improvement on this is  considerable, and she is now appropriate for needed surgery and with  general anesthesia. Will provide additional perioperative  recommendations/cautions after discussing with Dr. Freddi Che and Dr. Marolyn Hammock.  Previous RHC 03/18/16 showed: Findings: 1. Severe pulmonary hypertension (mean PA 51 mm Hg) with elevated PVR 2. Elevated right heart filling pressures (mean RA 15 mm Hg) 3. No significant large vessel filling defects on bilateral pulmonary  angiogram 4. Normal cardiac output (Fick cardiac output: 5.43 L/min; Fick cardiac  index: 2.55 L/min/m^2; Thermal cardiac output: 4.3 L/min; Thermal cardiac  index: 2.0 L/min/m^2) Following this RHS, Dr. Harrietta Guardian wrote: Discussed status of her new IPAH diagnosis, just now starting therapy (sildenafil just before the holidays, first dose of macitentan taken last night). Currently she is not clear to have general anesthesia, but hopeful that she will get a good response to therapy. I need her to be on therapy for at least one month prior to clearing her for GA and would want surgery to be done with anesthesiologists comfortable with her intraoperative care (given PAH diagnosis). Fine for this to be at [facility], as long as there is sufficient management comfort with her care. Dr. Barry Dienes to talk with her cardiac anesthesia group about recommendations, and will then make a  decision as to where she should have surgery. If echocardiogram at followup looks very concerning or there are other concerning features at followup visit, patient may need pre-admission for IV prostanoid initiation prior to surgery, versus planning with lumpectomy only not involving GA. She is also in need of a cardiologist for separate clearance prior to chemotherapy (herceptin +/- adriamycin), can assist with arranging at [facility] if desired (though she may get set up with Dr. Britta Mccreedy at [facility], still TBD).  Echo 04/21/16 ([place]; Care Everywhere): Result Narrative   Technically difficult study due to chest wall/lung interference  Normal left ventricular systolic function, ejection fraction 60 to 65%  Elevated pulmonary artery systolic pressure - moderate  Dilated right ventricle - severe  Reduced right ventricular systolic function  Dilated right atrium - severe  Elevated right atrial pressure   04/21/16 6-minute walk test [facility]): 6MWT 04/21/16: 236 meters, two 60 sec pauses (desats) HR 78->102, O2 sat 88% Ra, 93% 2L->88% 2l, ->88% 3l, -->95% 4L 6MWT 02/14/16: 304 meters. HR 77->102, O2 sat 92-95% RA  Spirometry 02/14/16 [facility]; Care Everywhere): FEV1 PRE 1.74 (L) 1.80838 - 1.47829 L  FEV1/FVC PRE 80.41 70.9869 - 92.3409 %  FVC PRE 2.16 (L) 2.29436 - 3.75906 L  PEF PRE 6.63 4.33103 - 8.46161 L/s  Vol extrap pre 0.03 L  FIVC PRE 1.93 (L) 2.29436 - 3.75906 L  PIF PRE 4.29 (H) 4.03484 - 4.03484 L/s  FIF50% PRED 4.29 2.23137 - 5.09367 L/s  FEV6 PRE 2.14 (L) 2.22607 - 5.62130 L  FEV1/FEV6 PRE  81.08 73.7618 - 93.4278 %  FEF50% PRE 3.03 L/s  FEF25-75% PRE 1.79 1.14444 - 3.97148 L/s  CRF/VOH60 pre 70.61 %  ISOFEF25-75 PRE 1.79 L/s  FET100% Change 7.41 sec   DLCO PRE 14.17 (L) 67.703403524818590 - 93.11216244695 ml/(min*mmHg)   BHT POST 10.99 sec   DLCO/VA POST 4.32 0.7225750518335825 - 5.890467150560001 ml/(min*mmHg*L)   VA PRE 3.28 (L) 4.70349 - 4.70349 L  IVC  PRE 2.04 (L) 2.29436 - 3.75906 L  DL Adj PRE 13.16 ml/(min*mmHg)   DL/VA Adj PRE 4.01    Sleep Study 05/12/16: IMPRESSIONS - Moderate obstructive sleep apnea occurred during this study (AHI = 15.8/h). - Insufficient early events to meet protocol requirements for split CPAP titration. - No significant central sleep apnea occurred during this study (CAI = 0.2/h). - Severe oxygen desaturation was noted during this study (Min O2 = 56.00%, Mean 88.7%). - The patient snored with Moderate snoring volume. - EKG findings include PVCs. - Clinically significant periodic limb movements did not occur during sleep. No significant associated arousals. RECOMMENDATIONS - Therapeutic CPAP titration to determine optimal pressure required to alleviate sleep disordered breathing. - Nocturnal hypoxemia may reflect underlying cardiopulmonary disease. - Avoid alcohol, sedatives and other CNS depressants that may worsen sleep apnea and disrupt normal sleep architecture. - Sleep hygiene should be reviewed to assess factors that may improve sleep quality. - Weight management and regular exercise should be initiated or continued if appropriate.  CTA chest 01/31/16: IMPRESSION: 1. No CT evidence for acute pulmonary embolus. No dissection. 2. Significantly enlarged pulmonary artery trunk and main pulmonary artery is suggesting pulmonary artery hypertension. Asymmetric enlargement of the right atrium and right ventricle suggests elevated right heart pressures. 3. Ectasia of the ascending aorta up to 4 cm. Recommend annual imaging followup by CTA or MRA. This recommendation follows 2010 ACCF/AHA/AATS/ACR/ASA/SCA/SCAI/SIR/STS/SVM Guidelines for the Diagnosis and Management of Patients with Thoracic Aortic Disease. Circulation. 2010; 121: e266-e369 4. 4 mm left lower lobe pulmonary nodule. No follow-up needed if patient is low-risk. Non-contrast chest CT can be considered in 12 months if patient is high-risk. This  recommendation follows the consensus statement: Guidelines for Management of Incidental Pulmonary Nodules Detected on CT Images: From the Fleischner Society 2017; Radiology 2017; 284:228-243. lobe subpleural nodule, series 6, image number 46.  Preoperative labs noted. Cr 1.01. CO2 26. Glucose 156. K 3.7. CBC WNL. A1c in process. (A1c 04/21/16 was 6.7.) Defer decision for pre-operative ABG to her assigned anesthesiologist.   If no acute changes then I anticipate that she can proceed as planned.  George Hugh [facility] Short Stay Center/Anesthesiology Phone [phone] 07/21/2016 4:07 PM

## 2016-07-21 NOTE — Pre-Procedure Instructions (Signed)
[name]  07/21/2016      [facility] [address] [place] [address] Phone: [phone] Fax: [phone]  [facility] [place], [place] [address] Phone: [phone] Fax: [place], [place] - [place] [address] [place] [address] Phone: [phone] Fax: [phone]    Your procedure is scheduled on April 30.  Report to [facility] Admitting at 5:30 A.M.  Call this number if you have problems the morning of surgery:  [phone]   Remember:  Do not eat food or drink liquids after midnight.  Take these medicines the morning of surgery with A SIP OF WATER :albuterol - bring with you, anastrozole (ARIMIDEX),  cetirizine (ZYRTEC), Fluticasone-Salmeterol (ADVAIR DISKUS), metoprolol tartrate (LOPRESSOR), OPSUMIT, UPTRAVI   STOP aspirin, herbal medication, vitamins,fish oil, BC'S, goody powder, advil, aleve, ibuprofen      How to Manage Your Diabetes Before and After Surgery  Why is it important to control my blood sugar before and after surgery? . Improving blood sugar levels before and after surgery helps healing and can limit problems. . A way of improving blood sugar control is eating a healthy diet by: o  Eating less sugar and carbohydrates o  Increasing activity/exercise o  Talking with your doctor about reaching your blood sugar goals . High blood sugars (greater than 180 mg/dL) can raise your risk of infections and slow your recovery, so you will need to focus on controlling your diabetes during the weeks before surgery. . Make sure that the doctor who takes care of your diabetes knows about your planned surgery including the date and location.  How do I manage my blood sugar before surgery? . Check your blood sugar at least 4 times a day,  starting 2 days before surgery, to make sure that the level is not too high or low. o Check your blood sugar the morning of your surgery when you wake up and every 2 hours until you get to the Short Stay unit. . If your blood sugar is less than 70 mg/dL, you will need to treat for low blood sugar: o Do not take insulin. o Treat a low blood sugar (less than 70 mg/dL) with  cup of clear juice (cranberry or apple), 4 glucose tablets, OR glucose gel. o Recheck blood sugar in 15 minutes after treatment (to make sure it is greater than 70 mg/dL). If your blood sugar is not greater than 70 mg/dL on recheck, call [phone] for further instructions. . Report your blood sugar to the short stay nurse when you get to Short Stay.  . If you are admitted to the hospital after surgery: o Your blood sugar will be checked by the staff and you will probably be given insulin after surgery (instead of oral diabetes medicines) to make sure you have good blood sugar levels. o The goal for blood sugar control after surgery is 80-180 mg/dL.       WHAT DO I DO ABOUT MY DIABETES MEDICATION?   Marland Kitchen Do not take oral diabetes medicines (pills) the morning of surgery.  . If your CBG is greater than 220 mg/dL, you may take  of your sliding scale (correction) dose of insulin.  Other Instructions:          Patient Signature:  Date:   Nurse Signature:  Date:   Reviewed and Endorsed by [facility] Patient Education Committee, August 2015   Do not wear jewelry, make-up or nail polish.  Do not wear lotions, powders, or perfumes, or deoderant.  Do not shave 48 hours prior to surgery.  Men may shave face and neck.  Do not bring valuables to the hospital.  [facility] is not responsible for any belongings or valuables.  Contacts, dentures or bridgework may not be worn into surgery.  Leave your suitcase in the car.  After surgery it may be brought to your room.  For patients admitted to the hospital, discharge  time will be determined by your treatment team.  Patients discharged the day of surgery will not be allowed to drive home.   Name and phone number of your driver:    Special instructions:  Preparing for surgery  Please read over the following fact sheets that you were given. Pain Booklet and Surgical Site Infection Prevention

## 2016-07-22 DIAGNOSIS — G4733 Obstructive sleep apnea (adult) (pediatric): Secondary | ICD-10-CM | POA: Diagnosis not present

## 2016-07-22 LAB — HEMOGLOBIN A1C
Hgb A1c MFr Bld: 5.7 % — ABNORMAL HIGH (ref 4.8–5.6)
Mean Plasma Glucose: 117 mg/dL

## 2016-07-24 DIAGNOSIS — C50212 Malignant neoplasm of upper-inner quadrant of left female breast: Secondary | ICD-10-CM | POA: Diagnosis not present

## 2016-07-27 ENCOUNTER — Ambulatory Visit (HOSPITAL_COMMUNITY): Payer: BLUE CROSS/BLUE SHIELD | Admitting: Vascular Surgery

## 2016-07-27 ENCOUNTER — Encounter (HOSPITAL_COMMUNITY)
Admission: RE | Disposition: A | Discharge status: Home / Self Care | Payer: Self-pay | Source: Ambulatory Visit | Attending: General Surgery

## 2016-07-27 ENCOUNTER — Ambulatory Visit (HOSPITAL_COMMUNITY): Payer: BLUE CROSS/BLUE SHIELD

## 2016-07-27 ENCOUNTER — Observation Stay (HOSPITAL_COMMUNITY)
Admission: RE | Admit: 2016-07-27 | Discharge: 2016-07-28 | Disposition: A | Discharge status: Home / Self Care | Payer: BLUE CROSS/BLUE SHIELD | Source: Ambulatory Visit | Attending: General Surgery | Admitting: General Surgery

## 2016-07-27 ENCOUNTER — Encounter (HOSPITAL_COMMUNITY): Payer: Self-pay | Admitting: Certified Registered Nurse Anesthetist

## 2016-07-27 ENCOUNTER — Encounter (HOSPITAL_COMMUNITY)
Admission: RE | Admit: 2016-07-27 | Discharge: 2016-07-27 | Disposition: A | Discharge status: Home / Self Care | Payer: BLUE CROSS/BLUE SHIELD | Source: Ambulatory Visit | Attending: General Surgery | Admitting: General Surgery

## 2016-07-27 ENCOUNTER — Ambulatory Visit (HOSPITAL_COMMUNITY): Payer: BLUE CROSS/BLUE SHIELD | Admitting: Anesthesiology

## 2016-07-27 ENCOUNTER — Observation Stay (HOSPITAL_COMMUNITY): Payer: BLUE CROSS/BLUE SHIELD

## 2016-07-27 DIAGNOSIS — Z87891 Personal history of nicotine dependence: Secondary | ICD-10-CM | POA: Diagnosis not present

## 2016-07-27 DIAGNOSIS — D649 Anemia, unspecified: Secondary | ICD-10-CM | POA: Diagnosis not present

## 2016-07-27 DIAGNOSIS — Z79899 Other long term (current) drug therapy: Secondary | ICD-10-CM | POA: Insufficient documentation

## 2016-07-27 DIAGNOSIS — G4730 Sleep apnea, unspecified: Secondary | ICD-10-CM | POA: Insufficient documentation

## 2016-07-27 DIAGNOSIS — Z170 Estrogen receptor positive status [ER+]: Secondary | ICD-10-CM | POA: Diagnosis not present

## 2016-07-27 DIAGNOSIS — C50212 Malignant neoplasm of upper-inner quadrant of left female breast: Principal | ICD-10-CM | POA: Insufficient documentation

## 2016-07-27 DIAGNOSIS — Z452 Encounter for adjustment and management of vascular access device: Secondary | ICD-10-CM | POA: Diagnosis not present

## 2016-07-27 DIAGNOSIS — I2782 Chronic pulmonary embolism: Secondary | ICD-10-CM | POA: Insufficient documentation

## 2016-07-27 DIAGNOSIS — G8918 Other acute postprocedural pain: Secondary | ICD-10-CM | POA: Diagnosis not present

## 2016-07-27 DIAGNOSIS — Z803 Family history of malignant neoplasm of breast: Secondary | ICD-10-CM | POA: Insufficient documentation

## 2016-07-27 DIAGNOSIS — Z86711 Personal history of pulmonary embolism: Secondary | ICD-10-CM | POA: Insufficient documentation

## 2016-07-27 DIAGNOSIS — I272 Pulmonary hypertension, unspecified: Secondary | ICD-10-CM | POA: Insufficient documentation

## 2016-07-27 DIAGNOSIS — Z7984 Long term (current) use of oral hypoglycemic drugs: Secondary | ICD-10-CM | POA: Diagnosis not present

## 2016-07-27 DIAGNOSIS — Z7901 Long term (current) use of anticoagulants: Secondary | ICD-10-CM | POA: Insufficient documentation

## 2016-07-27 DIAGNOSIS — C50912 Malignant neoplasm of unspecified site of left female breast: Secondary | ICD-10-CM | POA: Diagnosis not present

## 2016-07-27 DIAGNOSIS — Z7951 Long term (current) use of inhaled steroids: Secondary | ICD-10-CM | POA: Diagnosis not present

## 2016-07-27 DIAGNOSIS — E119 Type 2 diabetes mellitus without complications: Secondary | ICD-10-CM | POA: Insufficient documentation

## 2016-07-27 DIAGNOSIS — J449 Chronic obstructive pulmonary disease, unspecified: Secondary | ICD-10-CM | POA: Diagnosis not present

## 2016-07-27 DIAGNOSIS — Z90710 Acquired absence of both cervix and uterus: Secondary | ICD-10-CM | POA: Insufficient documentation

## 2016-07-27 DIAGNOSIS — Z95828 Presence of other vascular implants and grafts: Secondary | ICD-10-CM

## 2016-07-27 DIAGNOSIS — Z419 Encounter for procedure for purposes other than remedying health state, unspecified: Secondary | ICD-10-CM

## 2016-07-27 DIAGNOSIS — D259 Leiomyoma of uterus, unspecified: Secondary | ICD-10-CM | POA: Diagnosis not present

## 2016-07-27 HISTORY — DX: Adverse effect of unspecified anesthetic, initial encounter: T41.45XA

## 2016-07-27 HISTORY — PX: BREAST LUMPECTOMY: SHX2

## 2016-07-27 HISTORY — PX: BREAST LUMPECTOMY WITH RADIOACTIVE SEED AND SENTINEL LYMPH NODE BIOPSY: SHX6550

## 2016-07-27 HISTORY — PX: PORTA CATH INSERTION: CATH118285

## 2016-07-27 HISTORY — DX: Other complications of anesthesia, initial encounter: T88.59XA

## 2016-07-27 HISTORY — PX: PORTACATH PLACEMENT: SHX2246

## 2016-07-27 LAB — CBC
HCT: 35.8 % — ABNORMAL LOW (ref 36.0–46.0)
HEMOGLOBIN: 12.1 g/dL (ref 12.0–15.0)
MCH: 33.2 pg (ref 26.0–34.0)
MCHC: 33.8 g/dL (ref 30.0–36.0)
MCV: 98.4 fL (ref 78.0–100.0)
Platelets: 158 10*3/uL (ref 150–400)
RBC: 3.64 MIL/uL — AB (ref 3.87–5.11)
RDW: 12.8 % (ref 11.5–15.5)
WBC: 6.8 10*3/uL (ref 4.0–10.5)

## 2016-07-27 LAB — CREATININE, SERUM
CREATININE: 1.21 mg/dL — AB (ref 0.44–1.00)
GFR calc Af Amer: 59 mL/min — ABNORMAL LOW (ref >60)
GFR, EST NON AFRICAN AMERICAN: 51 mL/min — AB (ref >60)

## 2016-07-27 LAB — GLUCOSE, CAPILLARY
Glucose-Capillary: 116 mg/dL — ABNORMAL HIGH (ref 65–99)
Glucose-Capillary: 108 mg/dL — ABNORMAL HIGH (ref 65–99)

## 2016-07-27 SURGERY — BREAST LUMPECTOMY WITH RADIOACTIVE SEED AND SENTINEL LYMPH NODE BIOPSY
Anesthesia: General | Site: Chest | Laterality: Right

## 2016-07-27 MED ORDER — OXYCODONE HCL 5 MG/5ML PO SOLN: 5.0000 mg | Freq: Once | ORAL | Status: DC | PRN

## 2016-07-27 MED ORDER — ALBUTEROL SULFATE (2.5 MG/3ML) 0.083% IN NEBU: 3.0000 mL | INHALATION_SOLUTION | Freq: Four times a day (QID) | RESPIRATORY_TRACT | Status: DC | PRN

## 2016-07-27 MED ORDER — 0.9 % SODIUM CHLORIDE (POUR BTL) OPTIME
TOPICAL | Status: DC | PRN
  Administered 2016-07-27: 1000 mL

## 2016-07-27 MED ORDER — INSULIN GLARGINE 100 UNIT/ML ~~LOC~~ SOLN
25.0000 [IU] | Freq: Every day | SUBCUTANEOUS | Status: DC
  Filled 2016-07-27: qty 0.25

## 2016-07-27 MED ORDER — LIDOCAINE 2% (20 MG/ML) 5 ML SYRINGE
INTRAMUSCULAR | Status: AC
  Filled 2016-07-27: qty 5

## 2016-07-27 MED ORDER — ROCURONIUM BROMIDE 10 MG/ML (PF) SYRINGE
PREFILLED_SYRINGE | INTRAVENOUS | Status: DC | PRN
  Administered 2016-07-27: 50 mg via INTRAVENOUS

## 2016-07-27 MED ORDER — FLUTICASONE PROPIONATE 50 MCG/ACT NA SUSP
2.0000 | Freq: Every day | NASAL | Status: DC | PRN
  Filled 2016-07-27: qty 16

## 2016-07-27 MED ORDER — FENTANYL CITRATE (PF) 250 MCG/5ML IJ SOLN
INTRAMUSCULAR | Status: AC
  Filled 2016-07-27: qty 5

## 2016-07-27 MED ORDER — CIPROFLOXACIN IN D5W 400 MG/200ML IV SOLN
400.0000 mg | INTRAVENOUS | Status: AC
  Administered 2016-07-27: 400 mg via INTRAVENOUS
  Filled 2016-07-27: qty 200

## 2016-07-27 MED ORDER — ONDANSETRON HCL 4 MG/2ML IJ SOLN
INTRAMUSCULAR | Status: AC
  Filled 2016-07-27: qty 2

## 2016-07-27 MED ORDER — ONDANSETRON 4 MG PO TBDP: 4.0000 mg | ORAL_TABLET | Freq: Four times a day (QID) | ORAL | Status: DC | PRN

## 2016-07-27 MED ORDER — SELEXIPAG 200 MCG PO TABS: 200.0000 ug | ORAL_TABLET | Freq: Two times a day (BID) | ORAL | Status: DC

## 2016-07-27 MED ORDER — METOPROLOL TARTRATE 12.5 MG HALF TABLET
ORAL_TABLET | ORAL | Status: AC
  Administered 2016-07-27: 25 mg
  Filled 2016-07-27: qty 2

## 2016-07-27 MED ORDER — MIDAZOLAM HCL 2 MG/2ML IJ SOLN
INTRAMUSCULAR | Status: DC | PRN
  Administered 2016-07-27 (×2): 1 mg via INTRAVENOUS

## 2016-07-27 MED ORDER — PHENYLEPHRINE 40 MCG/ML (10ML) SYRINGE FOR IV PUSH (FOR BLOOD PRESSURE SUPPORT)
PREFILLED_SYRINGE | INTRAVENOUS | Status: AC
  Filled 2016-07-27: qty 10

## 2016-07-27 MED ORDER — HYDROCOD POLST-CPM POLST ER 10-8 MG/5ML PO SUER: 5.0000 mL | Freq: Two times a day (BID) | ORAL | Status: DC | PRN

## 2016-07-27 MED ORDER — MORPHINE SULFATE (PF) 4 MG/ML IV SOLN: 1.0000 mg | INTRAVENOUS | Status: DC | PRN

## 2016-07-27 MED ORDER — ONDANSETRON HCL 4 MG/2ML IJ SOLN: 4.0000 mg | Freq: Four times a day (QID) | INTRAMUSCULAR | Status: DC | PRN

## 2016-07-27 MED ORDER — ONDANSETRON HCL 4 MG/2ML IJ SOLN
INTRAMUSCULAR | Status: DC | PRN
  Administered 2016-07-27: 4 mg via INTRAVENOUS

## 2016-07-27 MED ORDER — HYDRALAZINE HCL 20 MG/ML IJ SOLN: 10.0000 mg | INTRAMUSCULAR | Status: DC | PRN

## 2016-07-27 MED ORDER — PROPOFOL 10 MG/ML IV BOLUS
INTRAVENOUS | Status: AC
  Filled 2016-07-27: qty 20

## 2016-07-27 MED ORDER — PROPOFOL 10 MG/ML IV BOLUS
INTRAVENOUS | Status: DC | PRN
  Administered 2016-07-27: 50 mg via INTRAVENOUS
  Administered 2016-07-27: 200 mg via INTRAVENOUS

## 2016-07-27 MED ORDER — LORATADINE 10 MG PO TABS: 10.0000 mg | ORAL_TABLET | Freq: Every day | ORAL | Status: DC | PRN

## 2016-07-27 MED ORDER — MOMETASONE FURO-FORMOTEROL FUM 200-5 MCG/ACT IN AERO
2.0000 | INHALATION_SPRAY | Freq: Two times a day (BID) | RESPIRATORY_TRACT | Status: DC
  Administered 2016-07-27 – 2016-07-28 (×2): 2 via RESPIRATORY_TRACT
  Filled 2016-07-27: qty 8.8

## 2016-07-27 MED ORDER — LOSARTAN POTASSIUM 50 MG PO TABS
50.0000 mg | ORAL_TABLET | Freq: Every day | ORAL | Status: DC
  Administered 2016-07-28: 50 mg via ORAL
  Filled 2016-07-27: qty 1

## 2016-07-27 MED ORDER — SPIRONOLACTONE 50 MG PO TABS
50.0000 mg | ORAL_TABLET | Freq: Every day | ORAL | Status: DC
  Administered 2016-07-28: 50 mg via ORAL
  Filled 2016-07-27: qty 1

## 2016-07-27 MED ORDER — CIPROFLOXACIN IN D5W 400 MG/200ML IV SOLN
400.0000 mg | Freq: Two times a day (BID) | INTRAVENOUS | Status: AC
  Administered 2016-07-27: 400 mg via INTRAVENOUS
  Filled 2016-07-27: qty 200

## 2016-07-27 MED ORDER — SODIUM CHLORIDE 0.9 % IV SOLN
INTRAVENOUS | Status: DC | PRN
  Administered 2016-07-27: 500 mL

## 2016-07-27 MED ORDER — SUGAMMADEX SODIUM 200 MG/2ML IV SOLN
INTRAVENOUS | Status: DC | PRN
  Administered 2016-07-27: 200 mg via INTRAVENOUS

## 2016-07-27 MED ORDER — OXYCODONE HCL 5 MG PO TABS: 5.0000 mg | ORAL_TABLET | Freq: Once | ORAL | Status: DC | PRN

## 2016-07-27 MED ORDER — MIDAZOLAM HCL 2 MG/2ML IJ SOLN
INTRAMUSCULAR | Status: AC
  Filled 2016-07-27: qty 2

## 2016-07-27 MED ORDER — SODIUM CHLORIDE 0.9 % IV SOLN
INTRAVENOUS | Status: DC
  Administered 2016-07-27: 13:00:00 via INTRAVENOUS

## 2016-07-27 MED ORDER — DIPHENHYDRAMINE HCL 50 MG/ML IJ SOLN: 12.5000 mg | Freq: Four times a day (QID) | INTRAMUSCULAR | Status: DC | PRN

## 2016-07-27 MED ORDER — SENNOSIDES-DOCUSATE SODIUM 8.6-50 MG PO TABS: 1.0000 | ORAL_TABLET | Freq: Every evening | ORAL | Status: DC | PRN

## 2016-07-27 MED ORDER — MACITENTAN 10 MG PO TABS: 10.0000 mg | ORAL_TABLET | Freq: Every day | ORAL | Status: DC

## 2016-07-27 MED ORDER — FENTANYL CITRATE (PF) 100 MCG/2ML IJ SOLN
INTRAMUSCULAR | Status: DC | PRN
  Administered 2016-07-27 (×2): 50 ug via INTRAVENOUS
  Administered 2016-07-27 (×2): 25 ug via INTRAVENOUS
  Administered 2016-07-27: 50 ug via INTRAVENOUS

## 2016-07-27 MED ORDER — LACTATED RINGERS IV SOLN
INTRAVENOUS | Status: DC | PRN
  Administered 2016-07-27 (×2): via INTRAVENOUS

## 2016-07-27 MED ORDER — ONDANSETRON HCL 4 MG/2ML IJ SOLN: 4.0000 mg | Freq: Once | INTRAMUSCULAR | Status: DC | PRN

## 2016-07-27 MED ORDER — CHLORHEXIDINE GLUCONATE CLOTH 2 % EX PADS: 6.0000 | MEDICATED_PAD | Freq: Once | CUTANEOUS | Status: DC

## 2016-07-27 MED ORDER — BUPIVACAINE-EPINEPHRINE (PF) 0.25% -1:200000 IJ SOLN
INTRAMUSCULAR | Status: DC | PRN
  Administered 2016-07-27: 30 mL

## 2016-07-27 MED ORDER — SODIUM CHLORIDE 0.9 % IJ SOLN
INTRAMUSCULAR | Status: AC
  Filled 2016-07-27: qty 10

## 2016-07-27 MED ORDER — OXYCODONE HCL 5 MG PO TABS
5.0000 mg | ORAL_TABLET | ORAL | Status: DC | PRN
  Administered 2016-07-27 – 2016-07-28 (×3): 5 mg via ORAL
  Filled 2016-07-27 (×3): qty 1

## 2016-07-27 MED ORDER — HEPARIN SOD (PORK) LOCK FLUSH 100 UNIT/ML IV SOLN
INTRAVENOUS | Status: DC | PRN
  Administered 2016-07-27: 500 [IU] via INTRAVENOUS

## 2016-07-27 MED ORDER — FENTANYL CITRATE (PF) 100 MCG/2ML IJ SOLN
25.0000 ug | INTRAMUSCULAR | Status: DC | PRN
  Administered 2016-07-27 (×2): 25 ug via INTRAVENOUS

## 2016-07-27 MED ORDER — TRIAMCINOLONE ACETONIDE 0.1 % EX CREA: 1.0000 "application " | TOPICAL_CREAM | Freq: Two times a day (BID) | CUTANEOUS | Status: DC | PRN

## 2016-07-27 MED ORDER — FENTANYL CITRATE (PF) 100 MCG/2ML IJ SOLN
INTRAMUSCULAR | Status: AC
  Filled 2016-07-27: qty 2

## 2016-07-27 MED ORDER — DIPHENHYDRAMINE HCL 12.5 MG/5ML PO ELIX: 12.5000 mg | ORAL_SOLUTION | Freq: Four times a day (QID) | ORAL | Status: DC | PRN

## 2016-07-27 MED ORDER — ENOXAPARIN SODIUM 40 MG/0.4ML ~~LOC~~ SOLN
40.0000 mg | SUBCUTANEOUS | Status: DC
  Administered 2016-07-28: 40 mg via SUBCUTANEOUS
  Filled 2016-07-27: qty 0.4

## 2016-07-27 MED ORDER — SILDENAFIL CITRATE 20 MG PO TABS
20.0000 mg | ORAL_TABLET | Freq: Three times a day (TID) | ORAL | Status: DC
  Administered 2016-07-27 – 2016-07-28 (×3): 20 mg via ORAL
  Filled 2016-07-27 (×3): qty 1

## 2016-07-27 MED ORDER — HEPARIN SOD (PORK) LOCK FLUSH 100 UNIT/ML IV SOLN
INTRAVENOUS | Status: AC
  Filled 2016-07-27: qty 5

## 2016-07-27 MED ORDER — SUGAMMADEX SODIUM 200 MG/2ML IV SOLN
INTRAVENOUS | Status: AC
  Filled 2016-07-27: qty 2

## 2016-07-27 MED ORDER — BUPIVACAINE HCL (PF) 0.25 % IJ SOLN
INTRAMUSCULAR | Status: AC
  Filled 2016-07-27: qty 30

## 2016-07-27 MED ORDER — ROCURONIUM BROMIDE 10 MG/ML (PF) SYRINGE
PREFILLED_SYRINGE | INTRAVENOUS | Status: AC
  Filled 2016-07-27: qty 5

## 2016-07-27 MED ORDER — PHENYLEPHRINE 40 MCG/ML (10ML) SYRINGE FOR IV PUSH (FOR BLOOD PRESSURE SUPPORT)
PREFILLED_SYRINGE | INTRAVENOUS | Status: DC | PRN
  Administered 2016-07-27 (×4): 80 ug via INTRAVENOUS

## 2016-07-27 MED ORDER — METOPROLOL TARTRATE 25 MG PO TABS
25.0000 mg | ORAL_TABLET | Freq: Two times a day (BID) | ORAL | Status: DC
  Administered 2016-07-27 – 2016-07-28 (×2): 25 mg via ORAL
  Filled 2016-07-27 (×2): qty 1

## 2016-07-27 MED ORDER — LIDOCAINE-EPINEPHRINE 1 %-1:100000 IJ SOLN
INTRAMUSCULAR | Status: AC
  Filled 2016-07-27: qty 1

## 2016-07-27 MED ORDER — POLYETHYL GLYCOL-PROPYL GLYCOL 0.4-0.3 % OP GEL
Freq: Every day | OPHTHALMIC | Status: DC | PRN
  Filled 2016-07-27: qty 10

## 2016-07-27 MED ORDER — PHENYLEPHRINE HCL 10 MG/ML IJ SOLN
INTRAVENOUS | Status: DC | PRN
  Administered 2016-07-27: 30 ug/min via INTRAVENOUS

## 2016-07-27 MED ORDER — TORSEMIDE 20 MG PO TABS
20.0000 mg | ORAL_TABLET | Freq: Two times a day (BID) | ORAL | Status: DC
  Administered 2016-07-27 – 2016-07-28 (×2): 20 mg via ORAL
  Filled 2016-07-27 (×2): qty 1

## 2016-07-27 MED ORDER — ACETAMINOPHEN 500 MG PO TABS: 1000.0000 mg | ORAL_TABLET | Freq: Four times a day (QID) | ORAL | Status: DC | PRN

## 2016-07-27 MED ORDER — LIDOCAINE 2% (20 MG/ML) 5 ML SYRINGE
INTRAMUSCULAR | Status: DC | PRN
  Administered 2016-07-27: 60 mg via INTRAVENOUS

## 2016-07-27 MED ORDER — SODIUM CHLORIDE 0.9 % IJ SOLN
INTRAVENOUS | Status: DC | PRN
  Administered 2016-07-27: 5 mL

## 2016-07-27 MED ORDER — PROMETHAZINE-CODEINE 6.25-10 MG/5ML PO SYRP: 5.0000 mL | ORAL_SOLUTION | ORAL | Status: DC | PRN

## 2016-07-27 MED ORDER — METHYLENE BLUE 0.5 % INJ SOLN
INTRAVENOUS | Status: AC
  Filled 2016-07-27: qty 10

## 2016-07-27 MED ORDER — TECHNETIUM TC 99M SULFUR COLLOID FILTERED
1.0000 | Freq: Once | INTRAVENOUS | Status: AC | PRN
  Administered 2016-07-27: 1 via INTRADERMAL

## 2016-07-27 MED ORDER — AZELASTINE-FLUTICASONE 137-50 MCG/ACT NA SUSP: 1.0000 | Freq: Two times a day (BID) | NASAL | Status: DC

## 2016-07-27 SURGICAL SUPPLY — 75 items
APPLIER CLIP 9.375 MED OPEN (MISCELLANEOUS)
BAG DECANTER FOR FLEXI CONT (MISCELLANEOUS) ×3 IMPLANT
BINDER BREAST LRG (GAUZE/BANDAGES/DRESSINGS) IMPLANT
BINDER BREAST XLRG (GAUZE/BANDAGES/DRESSINGS) ×3 IMPLANT
BLADE SURG 10 STRL SS (BLADE) ×3 IMPLANT
BLADE SURG 11 STRL SS (BLADE) ×3 IMPLANT
BLADE SURG 15 STRL LF DISP TIS (BLADE) ×2 IMPLANT
BLADE SURG 15 STRL SS (BLADE) ×1
BNDG COHESIVE 4X5 TAN STRL (GAUZE/BANDAGES/DRESSINGS) ×3 IMPLANT
CANISTER SUCT 3000ML PPV (MISCELLANEOUS) ×3 IMPLANT
CHLORAPREP W/TINT 10.5 ML (MISCELLANEOUS) ×3 IMPLANT
CHLORAPREP W/TINT 26ML (MISCELLANEOUS) ×3 IMPLANT
CLIP APPLIE 9.375 MED OPEN (MISCELLANEOUS) IMPLANT
CLIP TI LARGE 6 (CLIP) ×3 IMPLANT
CLIP TI MEDIUM 6 (CLIP) ×6 IMPLANT
CLIP TI WIDE RED SMALL 6 (CLIP) ×3 IMPLANT
CONT SPEC 4OZ CLIKSEAL STRL BL (MISCELLANEOUS) IMPLANT
COVER MAYO STAND STRL (DRAPES) ×3 IMPLANT
COVER PROBE W GEL 5X96 (DRAPES) ×3 IMPLANT
COVER SURGICAL LIGHT HANDLE (MISCELLANEOUS) ×3 IMPLANT
COVER TRANSDUCER ULTRASND GEL (DRAPE) ×3 IMPLANT
CRADLE DONUT ADULT HEAD (MISCELLANEOUS) ×3 IMPLANT
DECANTER SPIKE VIAL GLASS SM (MISCELLANEOUS) ×6 IMPLANT
DERMABOND ADHESIVE PROPEN (GAUZE/BANDAGES/DRESSINGS) ×2
DERMABOND ADVANCED (GAUZE/BANDAGES/DRESSINGS) ×1
DERMABOND ADVANCED .7 DNX12 (GAUZE/BANDAGES/DRESSINGS) ×2 IMPLANT
DERMABOND ADVANCED .7 DNX6 (GAUZE/BANDAGES/DRESSINGS) ×4 IMPLANT
DEVICE DUBIN SPECIMEN MAMMOGRA (MISCELLANEOUS) ×3 IMPLANT
DRAPE C-ARM 42X72 X-RAY (DRAPES) ×3 IMPLANT
DRAPE CHEST BREAST 15X10 FENES (DRAPES) ×3 IMPLANT
DRAPE UNIVERSAL PACK (DRAPES) ×3 IMPLANT
DRAPE UTILITY XL STRL (DRAPES) ×3 IMPLANT
DRAPE WARM FLUID 44X44 (DRAPE) IMPLANT
ELECT CAUTERY BLADE 6.4 (BLADE) ×3 IMPLANT
ELECT COATED BLADE 2.86 ST (ELECTRODE) ×3 IMPLANT
ELECT REM PT RETURN 9FT ADLT (ELECTROSURGICAL) ×3
ELECTRODE REM PT RTRN 9FT ADLT (ELECTROSURGICAL) ×2 IMPLANT
GAUZE SPONGE 4X4 16PLY XRAY LF (GAUZE/BANDAGES/DRESSINGS) ×3 IMPLANT
GEL ULTRASOUND 20GR AQUASONIC (MISCELLANEOUS) IMPLANT
GLOVE BIO SURGEON STRL SZ 6 (GLOVE) ×3 IMPLANT
GLOVE BIOGEL PI IND STRL 6.5 (GLOVE) ×2 IMPLANT
GLOVE BIOGEL PI INDICATOR 6.5 (GLOVE) ×1
GOWN STRL REUS W/ TWL LRG LVL3 (GOWN DISPOSABLE) ×2 IMPLANT
GOWN STRL REUS W/TWL 2XL LVL3 (GOWN DISPOSABLE) ×6 IMPLANT
GOWN STRL REUS W/TWL LRG LVL3 (GOWN DISPOSABLE) ×1
KIT BASIN OR (CUSTOM PROCEDURE TRAY) ×3 IMPLANT
KIT MARKER MARGIN INK (KITS) ×3 IMPLANT
KIT PORT POWER 8FR ISP CVUE (Catheter) ×3 IMPLANT
KIT ROOM TURNOVER OR (KITS) ×3 IMPLANT
LIGHT WAVEGUIDE WIDE FLAT (MISCELLANEOUS) ×3 IMPLANT
NDL SAFETY ECLIPSE 18X1.5 (NEEDLE) ×2 IMPLANT
NEEDLE FILTER BLUNT 18X 1/2SAF (NEEDLE) ×1
NEEDLE FILTER BLUNT 18X1 1/2 (NEEDLE) ×2 IMPLANT
NEEDLE HYPO 18GX1.5 SHARP (NEEDLE) ×1
NEEDLE HYPO 25GX1X1/2 BEV (NEEDLE) ×3 IMPLANT
NS IRRIG 1000ML POUR BTL (IV SOLUTION) ×3 IMPLANT
PACK SURGICAL SETUP 50X90 (CUSTOM PROCEDURE TRAY) ×3 IMPLANT
PAD ARMBOARD 7.5X6 YLW CONV (MISCELLANEOUS) ×3 IMPLANT
PENCIL BUTTON HOLSTER BLD 10FT (ELECTRODE) ×3 IMPLANT
SPONGE LAP 18X18 X RAY DECT (DISPOSABLE) ×3 IMPLANT
STOCKINETTE IMPERVIOUS 9X36 MD (GAUZE/BANDAGES/DRESSINGS) ×3 IMPLANT
SUT MNCRL AB 4-0 PS2 18 (SUTURE) ×6 IMPLANT
SUT MON AB 4-0 PC3 18 (SUTURE) ×3 IMPLANT
SUT PROLENE 2 0 SH DA (SUTURE) ×6 IMPLANT
SUT VIC AB 3-0 SH 27 (SUTURE) ×1
SUT VIC AB 3-0 SH 27X BRD (SUTURE) ×2 IMPLANT
SUT VIC AB 3-0 SH 8-18 (SUTURE) ×3 IMPLANT
SYR 20ML ECCENTRIC (SYRINGE) ×6 IMPLANT
SYR 5ML LUER SLIP (SYRINGE) ×3 IMPLANT
SYR BULB 3OZ (MISCELLANEOUS) ×3 IMPLANT
SYR CONTROL 10ML LL (SYRINGE) ×6 IMPLANT
TOWEL OR 17X24 6PK STRL BLUE (TOWEL DISPOSABLE) ×3 IMPLANT
TOWEL OR 17X26 10 PK STRL BLUE (TOWEL DISPOSABLE) ×3 IMPLANT
TUBE CONNECTING 12X1/4 (SUCTIONS) ×3 IMPLANT
YANKAUER SUCT BULB TIP NO VENT (SUCTIONS) ×3 IMPLANT

## 2016-07-27 NOTE — Anesthesia Preprocedure Evaluation (Addendum)
Anesthesia Evaluation  Patient identified by MRN, date of birth, ID band Patient awake    Reviewed: Allergy & Precautions, NPO status , Patient's Chart, lab work & pertinent test results  Airway Mallampati: III  TM Distance: >3 FB Neck ROM: Full    Dental  (+) Teeth Intact, Dental Advisory Given   Pulmonary sleep apnea, Continuous Positive Airway Pressure Ventilation and Oxygen sleep apnea , former smoker,    breath sounds clear to auscultation       Cardiovascular hypertension, Pt. on medications and Pt. on home beta blockers  Rhythm:Regular Rate:Normal     Neuro/Psych    GI/Hepatic   Endo/Other  diabetes  Renal/GU      Musculoskeletal   Abdominal (+) + obese,   Peds  Hematology   Anesthesia Other Findings   Reproductive/Obstetrics                           Anesthesia Physical Anesthesia Plan  ASA: III  Anesthesia Plan: General   Post-op Pain Management: GA combined w/ Regional for post-op pain   Induction: Intravenous  Airway Management Planned: LMA  Additional Equipment:   Intra-op Plan:   Post-operative Plan:   Informed Consent: I have reviewed the patients History and Physical, chart, labs and discussed the procedure including the risks, benefits and alternatives for the proposed anesthesia with the patient or authorized representative who has indicated his/her understanding and acceptance.   Dental advisory given  Plan Discussed with: CRNA and Anesthesiologist  Anesthesia Plan Comments:        Anesthesia Quick Evaluation

## 2016-07-27 NOTE — Interval H&P Note (Signed)
History and Physical Interval Note:  07/27/2016 7:30 AM  [name]  has presented today for surgery, with the diagnosis of LEFT BREAST CANCER  The various methods of treatment have been discussed with the patient and family. After consideration of risks, benefits and other options for treatment, the patient has consented to  Procedure(s): BREAST LUMPECTOMY WITH RADIOACTIVE SEED AND SENTINEL LYMPH NODE BIOPSY (Left) INSERTION PORT-A-CATH (N/A) as a surgical intervention .  The patient's history has been reviewed, patient examined, no change in status, stable for surgery.  I have reviewed the patient's chart and labs.  Questions were answered to the patient's satisfaction.     [name]

## 2016-07-27 NOTE — H&P (Signed)
[name] 07/01/2016 9:58 AM Location: [facility] Patient #: [id number] DOB: 1965/11/17 Single / Language: [name] / Race: Black or African American Female   History of Present Illness Stark Klein MD; 07/12/2016 6:36 PM) The patient is a 51 year old female who presents with breast cancer. Patient is a 51 yo F who is referred by Dr. Karlyn Agee last fall for consultation regarding a diagnosis of left breast cancer. She had a screening detected density in her left breast. Diagnostic imaging showed a 1.4 cm mass in the upper inner quadrant. 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 Ki 67 was 30%.  Her paternal and maternal grandmothers had breast cancer, though she is not sure of the age of diagnosis. A maternal uncle has a history of colon cancer, and a paternal cousin also had breast cancer. She had menarche at age 57. She is a G3P1, with first child in her late teens. She used oral contraceptives for several years. She had partial hysterectomy and is no longer having periods.   Of note, the patient has severe pulmonary hypertension and is seeing Dr. Harrietta Guardian at [facility] for this. Her medications have been adjusted. Her cardiologist there has planned repeat right heart cath. She desires to have surgery at [facility]. She is not excited about getting chemotherapy. She feels that her breathing is significantly better.   dx mammogram/ultrasound/clip films reviewed along wtih reports.   CBC normal, CMET with T bili 1.39   Pathology Diagnosis Breast, left, needle core biopsy, 10 o'clock - INVASIVE DUCTAL CARCINOMA. - SEE COMMENT. Microscopic Comment The carcinoma is grade 3.  She used to see Dr. Nadyne Coombes in [place] for cardiology, now sees Dr. Humphrey Rolls in [place]. she is getting cath by Dr. Kerry Dory [facility]. Pulmonologist is Dr. Daphane Shepherd at [facility].      Problem List/Past Medical [name], [name];  07/01/2016 9:59 AM) CHRONIC OBSTRUCTIVE PULMONARY DISEASE, UNSPECIFIED COPD TYPE (J44.9)  HISTORY OF PULMONARY EMBOLUS (PE) (Z86.711)   Past Surgical History [name], [name]; 07/01/2016 9:59 AM) Breast Biopsy  Left. multiple Cesarean Section - Multiple  Hysterectomy (not due to cancer) - Partial  Oral Surgery   Diagnostic Studies History [name], [name]; 07/01/2016 9:59 AM) Colonoscopy  never Mammogram  within last year Pap Smear  1-5 years ago  Medication History [name], [name]; 07/01/2016 9:59 AM) Advair Diskus (250-50MCG/DOSE Aero Pow Br Act, Inhalation) Active. AmLODIPine Besylate (5MG Tablet, Oral) Active. Furosemide (40MG Tablet, Oral) Active. Lantus (100UNIT/ML Solution, Subcutaneous) Active. Levocetirizine Dihydrochloride (5MG Tablet, Oral) Active. Losartan Potassium (100MG Tablet, Oral) Active. MetFORMIN HCl (500MG Tablet, Oral) Active. Metoprolol Tartrate (100MG Tablet, Oral) Active. Metoprolol Tartrate (25MG Tablet, Oral) Active. Pravastatin Sodium (40MG Tablet, Oral) Active. Spironolactone (50MG Tablet, Oral) Active. Triamcinolone Acetonide (0.1% Cream, External) Active. Xarelto (20MG Tablet, Oral) Active. Promethazine-Codeine (6.25-10MG/5ML Syrup, Oral) Active. Azithromycin (250MG Tablet, Oral) Active. Medications Reconciled  Social History [name], [name]; 07/01/2016 9:59 AM) Alcohol use  Occasional alcohol use. Caffeine use  Carbonated beverages, Coffee, Tea. No drug use  Tobacco use  Former smoker.  Family History Sharyn Lull R. Rolena Infante, [name]; 07/01/2016 9:59 AM) Arthritis  Father, Mother. Breast Cancer  Family Members In General. Cerebrovascular Accident  Sister. Colon Cancer  Family Members In General. Diabetes Mellitus  Brother, Family Members In General. Heart Disease  Brother. Hypertension  Father, Mother, Sister. Kidney Disease  Mother. Respiratory Condition  Mother. Thyroid problems   Sister.  Pregnancy /  Birth History [name], [name]; 07/01/2016 9:59 AM) Age at menarche  21 years. Age of menopause  70-50 Contraceptive History  Oral contraceptives. Gravida  3 Irregular periods  Length (months) of breastfeeding  3-6 Maternal age  35-20 Para  1  Other Problems [name], [name]; 07/01/2016 9:59 AM) Bladder Problems  Breast Cancer  Diabetes Mellitus  General anesthesia - complications  High blood pressure  Home Oxygen Use  Lump In Breast  Pulmonary Embolism / Blood Clot in Legs  Sleep Apnea  Transfusion history  MALIGNANT NEOPLASM OF UPPER-INNER QUADRANT OF LEFT BREAST IN FEMALE, ESTROGEN RECEPTOR POSITIVE (F74.944)     Review of Systems Stark Klein MD; 07/12/2016 6:24 PM) All other systems negative  Vitals [name] [name]; 07/01/2016 9:58 AM) 07/01/2016 9:58 AM Weight: 236.06 lb Pulse: 91 (Regular)  P.OX: 92% (Room air) BP: 124/80 (Sitting, Left Arm, Standard)       Physical Exam Stark Klein MD; 07/12/2016 6:25 PM) General Mental Status-Alert. General Appearance-Consistent with stated age. Hydration-Well hydrated. Voice-Normal.  Head and Neck Head-normocephalic, atraumatic with no lesions or palpable masses.  Eye Sclera/Conjunctiva - Bilateral-No scleral icterus.  Chest and Lung Exam Chest and lung exam reveals -quiet, even and easy respiratory effort with no use of accessory muscles. Inspection Chest Wall - Normal. Back - normal.  Breast Note: thickening left upper breast. no discrete mass. no nipple retraction or skin dimpling. no nipple discharge. no LAD.   Cardiovascular Cardiovascular examination reveals -normal pedal pulses bilaterally. Note: regular rate and rhythm  Abdomen Inspection-Inspection Normal. Palpation/Percussion Palpation and Percussion of the abdomen reveal - Soft, Non Tender, No Rebound tenderness, No Rigidity (guarding) and No  hepatosplenomegaly.  Peripheral Vascular Upper Extremity Inspection - Bilateral - Normal - No Clubbing, No Cyanosis, No Edema, Pulses Intact. Lower Extremity Palpation - Edema - Bilateral - No edema.  Neurologic Neurologic evaluation reveals -alert and oriented x 3 with no impairment of recent or remote memory. Mental Status-Normal.  Musculoskeletal Global Assessment -Note: no gross deformities.  Normal Exam - Left-Upper Extremity Strength Normal and Lower Extremity Strength Normal. Normal Exam - Right-Upper Extremity Strength Normal and Lower Extremity Strength Normal.  Lymphatic Head & Neck  General Head & Neck Lymphatics: Bilateral - Description - Normal. Axillary  General Axillary Region: Bilateral - Description - Normal. Tenderness - Non Tender.    Assessment & Plan Stark Klein MD; 07/12/2016 6:40 PM) MALIGNANT NEOPLASM OF UPPER-INNER QUADRANT OF LEFT BREAST IN FEMALE, ESTROGEN RECEPTOR POSITIVE (C50.212) Impression: Pt has been medically optimized. I will send her for anesthesia consult to review her pulmonary hypertension.  If they are unwilling to do general anesthesia, I will plan to do lumpectomy and port with sedation and local. However, the standard of care is to do sentinel lymph node and this would have improtant prognostic information.  Her heart function is felt to be adequate for chemo.  Once anesthesia has cleared her for surgery, will plan seed localized lumpectomy, (sentinel node), and port (her 2 overexpressed).  I reviewed risks of surgery with patient including death, need for more surgery, etc. Current Plans Pt Education - flb breast cancer surgery: discussed with patient and provided information. You are being scheduled for surgery- Our schedulers will call you.  You should hear from our office's scheduling department within 5 working days about the location, date, and time of surgery. We try to make accommodations for patient's  preferences in scheduling surgery, but sometimes the OR schedule or the surgeon's schedule prevents  [place] from making those accommodations.  If you have not heard from our office [phone] in 5 working days, call the office and ask for your surgeon's nurse.  If you have other questions about your diagnosis, plan, or surgery, call the office and ask for your surgeon's nurse.  PULMONARY HYPERTENSION (I27.20)    Signed by Stark Klein, MD (07/12/2016 6:41 PM)

## 2016-07-27 NOTE — Transfer of Care (Signed)
Immediate Anesthesia Transfer of Care Note  Patient: [name]  Procedure(s) Performed: Procedure(s): BREAST LUMPECTOMY WITH RADIOACTIVE SEED AND SENTINEL LYMPH NODE BIOPSY (Left) INSERTION PORT-A-CATH (Right)  Patient Location: PACU  Anesthesia Type:General  Level of Consciousness: awake, alert  and oriented  Airway & Oxygen Therapy: Patient Spontanous Breathing and Patient connected to face mask oxygen  Post-op Assessment: Report given to RN, Post -op Vital signs reviewed and stable and Patient moving all extremities X 4  Post vital signs: Reviewed and stable  Last Vitals:  Vitals:   07/27/16 0615  BP: 106/66  Pulse: 88  Resp: 20  Temp: 37.2 C    Last Pain:  Vitals:   07/27/16 0615  TempSrc: Oral         Complications: No apparent anesthesia complications

## 2016-07-27 NOTE — Discharge Instructions (Addendum)
Incentive Spirometer An incentive spirometer is a tool that measures how well you are filling your lungs with each breath. This tool can help keep your lungs clear and active. Taking long, deep breaths may help reverse or decrease the chance of developing breathing (pulmonary) problems, especially infection, following:  Surgery of the chest or abdomen.  Surgery if you have a history of smoking or a lung problem.  A long period of time when you are unable to move or be active. If the spirometer includes an indicator to show your best effort, your health care provider or respiratory therapist will help you set a goal. Keep a log of your progress if directed by your health care provider. What are the risks?  Breathing too quickly may cause dizziness or cause you to pass out. Take your time so you do not get dizzy or lightheaded.  If you are in pain, you may need to take or ask for pain medicine before doing incentive spirometry. It is harder to take a deep breath if you are having pain. How to use your incentive spirometer 1. Sit on the edge of your bed if possible, or sit up as far as you can in bed or on a chair. 2. Hold the incentive spirometer in an upright position. 3. Breathe out normally. 4. Place the mouthpiece in your mouth and seal your lips tightly around it. 5. Breathe in slowly and as deeply as possible, raising the piston or the ball toward the top of the column. 6. Hold your breath for 3-5 seconds or for as long as possible. Allow the piston or ball to fall to the bottom of the column. 7. Remove the mouthpiece from your mouth and breathe out normally. 8. The spirometer may include an indicator to show your best effort. Use the indicator as a goal to work toward during each repetition. 9. Rest for a few seconds and repeat this at least 10 times, every 1-2 hours when you are awake. Take your time and take a few normal breaths between deep breaths. Breathing too quickly may cause  dizziness or cause you to pass out. Take your time so you do not get dizzy or lightheaded. 10. After each set of 10 deep breaths, practice coughing to be sure your lungs are clear. If you had a surgical cut (incision) made during surgery, support your incision when coughing by placing a pillow or rolled-up towel firmly against it. Once you are able to get out of bed, walk around indoors and cough well. You may stop using the incentive spirometer when instructed by your health care provider. Contact a health care provider if:  You are having difficulty using the spirometer.  You have trouble using the spirometer as often as instructed.  Your pain medicine is not giving enough relief while using the spirometer.  You have a fever.  You develop shortness of breath. Get help right away if:  You develop a cough with bloody sputum.  You develop worsening pain, redness, or discharge at or near the incision site. This information is not intended to replace advice given to you by your health care provider. Make sure you discuss any questions you have with your health care provider. Document Released: 07/27/2006 Document Revised: 12/09/2015 Document Reviewed: 10/23/2013 Elsevier Interactive Patient Education  2017 [place] Office Phone Number [phone]  BREAST BIOPSY/ PARTIAL MASTECTOMY: POST OP INSTRUCTIONS  Always review your discharge instruction sheet given to you by the  facility where your surgery was performed.  IF YOU HAVE DISABILITY OR FAMILY LEAVE FORMS, YOU MUST BRING THEM TO THE OFFICE FOR PROCESSING.  DO NOT GIVE THEM TO YOUR DOCTOR.  1. A prescription for pain medication may be given to you upon discharge.  Take your pain medication as prescribed, if needed.  If narcotic pain medicine is not needed, then you may take acetaminophen (Tylenol) or ibuprofen (Advil) as needed. 2. Take your usually prescribed medications unless otherwise  directed 3. If you need a refill on your pain medication, please contact your pharmacy.  They will contact our office to request authorization.  Prescriptions will not be filled after 5pm or on week-ends. 4. You should eat very light the first 24 hours after surgery, such as soup, crackers, pudding, etc.  Resume your normal diet the day after surgery. 5. Most patients will experience some swelling and bruising in the breast.  Ice packs and a good support bra will help.  Swelling and bruising can take several days to resolve.  6. It is common to experience some constipation if taking pain medication after surgery.  Increasing fluid intake and taking a stool softener will usually help or prevent this problem from occurring.  A mild laxative (Milk of Magnesia or Miralax) should be taken according to package directions if there are no bowel movements after 48 hours. 7. Unless discharge instructions indicate otherwise, you may remove your bandages 48 hours after surgery, and you may shower at that time.  You may have steri-strips (small skin tapes) in place directly over the incision.  These strips should be left on the skin for 7-10 days.   Any sutures or staples will be removed at the office during your follow-up visit. 8. ACTIVITIES:  You may resume regular daily activities (gradually increasing) beginning the next day.  Wearing a good support bra or sports bra (or the breast binder) minimizes pain and swelling.  You may have sexual intercourse when it is comfortable. a. You may drive when you no longer are taking prescription pain medication, you can comfortably wear a seatbelt, and you can safely maneuver your car and apply brakes. b. RETURN TO WORK:  __________1 week_______________ 9. You should see your doctor in the office for a follow-up appointment approximately two weeks after your surgery.  Your doctors nurse will typically make your follow-up appointment when she calls you with your pathology  report.  Expect your pathology report 2-3 business days after your surgery.  You may call to check if you do not hear from [place] after three days.   WHEN TO CALL YOUR DOCTOR: 1. Fever over 101.0 2. Nausea and/or vomiting. 3. Extreme swelling or bruising. 4. Continued bleeding from incision. 5. Increased pain, redness, or drainage from the incision.  The clinic staff is available to answer your questions during regular business hours.  Please dont hesitate to call and ask to speak to one of the nurses for clinical concerns.  If you have a medical emergency, go to the nearest emergency room or call 911.  A surgeon from [facility] is always on call at the hospital.  For further questions, please visit [email or web address]

## 2016-07-27 NOTE — Op Note (Signed)
Left Breast Radioactive seed localized lumpectomy, sentinel node mapping, sentinel lymph node biopsy, Right subclavian port placement  Indications: This patient presents with history of left breast cancer, UIQ, cT1cN0M0, ER+, PR-, Her2+  Pre-operative Diagnosis: left breast cancer  Post-operative Diagnosis: Same  Surgeon: Stark Klein   Anesthesia: General endotracheal anesthesia  ASA Class: 3  Procedure Details  The patient was seen in the Holding Room. The risks, benefits, complications, treatment options, and expected outcomes were discussed with the patient. The possibilities of bleeding, infection, the need for additional procedures, failure to diagnose a condition, and creating a complication requiring transfusion or operation were discussed with the patient. The patient concurred with the proposed plan, giving informed consent.  The site of surgery properly noted/marked. The patient was taken to Operating Room # 1, identified, and the procedure verified as Left Breast seed localized lumpectomy, sentinel lymph node biopsy, and port placement. A Time Out was held and the above information confirmed.  The left arm, breast, and chest as well as right chest were prepped and draped in standard fashion. The lumpectomy was performed by creating an transverse incision over the upper inner quadrant of the breast over the previously placed radioactive seed.  Dissection was carried down to around the point of maximum signal intensity. The cautery was used to perform the dissection.  Hemostasis was achieved with cautery. The edges of the cavity were marked with large clips, with one each medial, lateral, inferior and superior, and two clips posteriorly.   The specimen was inked with the margin marker paint kit.    Specimen radiography confirmed inclusion of the mammographic lesion, the clip, and the seed.  The background signal in the breast was zero.  The wound was irrigated and closed with 3-0 vicryl in  layers and 4-0 monocryl subcuticular suture.    Using a hand-held gamma probe, left axillary sentinel nodes were identified transcutaneously.  An oblique incision was created below the axillary hairline.  Dissection was carried through the clavipectoral fascia.  Three level II deep axillary sentinel nodes were removed.  Counts per second were 1590, 120, and 80.    The background count was 12 cps.  The wound was irrigated.  Hemostasis was achieved with cautery.  The axillary incision was closed with a 3-0 vicryl deep dermal interrupted sutures and a 4-0 monocryl subcuticular closure.  Local anesthetic was administered over this   area at the angle of the right clavicle.  The vein was accessed with 1 pass of the needle. There was good venous return and the wire passed easily with no ectopy.   Fluoroscopy was used to confirm that the wire was in the vena cava.      The patient was placed back level and the area for the pocket was anethetized   with local anesthetic.  A 3-cm transverse incision was made with a #15   blade.  Cautery was used to divide the subcutaneous tissues down to the   pectoralis muscle.  An [employer] retractor was used to elevate the skin   while a pocket was created on top of the pectoralis fascia.  The port   was placed into the pocket to confirm that it was of adequate size.  The   catheter was preattached to the port.  The port was then secured to the   pectoralis fascia with four 2-0 Prolene sutures.  These were clamped and   not tied down yet.    The catheter was tunneled through to the  wire exit   site.  The catheter was placed along the wire to determine what length it should be to be in the SVC.  The catheter was cut at 18.5 cm.  The tunneler sheath and dilator were passed over the wire and the dilator and wire were removed.  The catheter was advanced through the tunneler sheath and the tunneler sheath was pulled away.  Care was taken to keep the catheter in the tunneler  sheath as this occurred. This was advanced and the tunneler sheath was removed.  There was good venous   return and easy flush of the catheter.  The Prolene sutures were tied   down to the pectoral fascia.  The skin was reapproximated using 3-0   Vicryl interrupted deep dermal sutures.    Fluoroscopy was used to re-confirm good position of the catheter.  The skin   was then closed using 4-0 Monocryl in a subcuticular fashion.  The port was flushed with concentrated heparin flush as well.  The wounds were then cleaned, dried, and dressed with Dermabond.    Sterile dressings were applied. At the end of the operation, all sponge, instrument, and needle counts were correct.  Findings: grossly clear surgical margins and no adenopathy, posterior margin is pectoralis, anterior margin skin.  Estimated Blood Loss:  min         Specimens: left breast lumpectomy and three deep left axillary sentinel lymph nodes.             Complications:  None; patient tolerated the procedure well.         Disposition: PACU - hemodynamically stable.         Condition: stable

## 2016-07-27 NOTE — Anesthesia Procedure Notes (Addendum)
Procedure Name: Intubation Date/Time: 07/27/2016 7:57 AM Performed by: Garrison Columbus T Pre-anesthesia Checklist: Patient identified, Emergency Drugs available, Suction available and Patient being monitored Patient Re-evaluated:Patient Re-evaluated prior to inductionOxygen Delivery Method: Circle System Utilized Preoxygenation: Pre-oxygenation with 100% oxygen Intubation Type: IV induction and Inhalational induction Laryngoscope Size: Glidescope and 4 Grade View: Grade I Tube type: Oral Tube size: 7.5 mm Number of attempts: 1 Airway Equipment and Method: Stylet and Oral airway Placement Confirmation: ETT inserted through vocal cords under direct vision,  positive ETCO2 and breath sounds checked- equal and bilateral Secured at: 21 cm Tube secured with: Tape Dental Injury: Teeth and Oropharynx as per pre-operative assessment  Comments: After successful LMA 4 insertion, patient began bucking LMA. Decision made to intubate. Easy, atraumatic intubation. Elective use of glidescope due to obesity and large tongue.

## 2016-07-27 NOTE — Anesthesia Postprocedure Evaluation (Addendum)
Anesthesia Post Note  Patient: [name]  Procedure(s) Performed: Procedure(s) (LRB): BREAST LUMPECTOMY WITH RADIOACTIVE SEED AND SENTINEL LYMPH NODE BIOPSY (Left) INSERTION PORT-A-CATH (Right)  Patient location during evaluation: PACU Anesthesia Type: General Level of consciousness: awake, awake and alert and oriented Pain management: pain level controlled Vital Signs Assessment: post-procedure vital signs reviewed and stable Respiratory status: spontaneous breathing, nonlabored ventilation and respiratory function stable Cardiovascular status: blood pressure returned to baseline Postop Assessment: no headache Anesthetic complications: no       Last Vitals:  Vitals:   07/27/16 1248 07/27/16 1500  BP: (!) 103/51 (!) 105/53  Pulse: (!) 57 (!) 56  Resp: 15   Temp:  36.6 C    Last Pain:  Vitals:   07/27/16 1500  TempSrc: Oral  PainSc:                  Daved Mcfann COKER

## 2016-07-28 ENCOUNTER — Encounter (HOSPITAL_COMMUNITY): Payer: Self-pay | Admitting: General Surgery

## 2016-07-28 DIAGNOSIS — Z803 Family history of malignant neoplasm of breast: Secondary | ICD-10-CM | POA: Diagnosis not present

## 2016-07-28 DIAGNOSIS — Z170 Estrogen receptor positive status [ER+]: Secondary | ICD-10-CM | POA: Diagnosis not present

## 2016-07-28 DIAGNOSIS — Z90710 Acquired absence of both cervix and uterus: Secondary | ICD-10-CM | POA: Diagnosis not present

## 2016-07-28 DIAGNOSIS — Z86711 Personal history of pulmonary embolism: Secondary | ICD-10-CM | POA: Diagnosis not present

## 2016-07-28 DIAGNOSIS — J449 Chronic obstructive pulmonary disease, unspecified: Secondary | ICD-10-CM | POA: Diagnosis not present

## 2016-07-28 DIAGNOSIS — Z87891 Personal history of nicotine dependence: Secondary | ICD-10-CM | POA: Diagnosis not present

## 2016-07-28 DIAGNOSIS — C50212 Malignant neoplasm of upper-inner quadrant of left female breast: Secondary | ICD-10-CM | POA: Diagnosis not present

## 2016-07-28 DIAGNOSIS — E119 Type 2 diabetes mellitus without complications: Secondary | ICD-10-CM | POA: Diagnosis not present

## 2016-07-28 DIAGNOSIS — Z7951 Long term (current) use of inhaled steroids: Secondary | ICD-10-CM | POA: Diagnosis not present

## 2016-07-28 DIAGNOSIS — Z7901 Long term (current) use of anticoagulants: Secondary | ICD-10-CM | POA: Diagnosis not present

## 2016-07-28 DIAGNOSIS — I272 Pulmonary hypertension, unspecified: Secondary | ICD-10-CM | POA: Diagnosis not present

## 2016-07-28 DIAGNOSIS — I2782 Chronic pulmonary embolism: Secondary | ICD-10-CM | POA: Diagnosis not present

## 2016-07-28 DIAGNOSIS — Z7984 Long term (current) use of oral hypoglycemic drugs: Secondary | ICD-10-CM | POA: Diagnosis not present

## 2016-07-28 DIAGNOSIS — G4730 Sleep apnea, unspecified: Secondary | ICD-10-CM | POA: Diagnosis not present

## 2016-07-28 DIAGNOSIS — Z79899 Other long term (current) drug therapy: Secondary | ICD-10-CM | POA: Diagnosis not present

## 2016-07-28 LAB — CBC
HEMATOCRIT: 35.8 % — AB (ref 36.0–46.0)
HEMOGLOBIN: 11.5 g/dL — AB (ref 12.0–15.0)
MCH: 32.1 pg (ref 26.0–34.0)
MCHC: 32.1 g/dL (ref 30.0–36.0)
MCV: 100 fL (ref 78.0–100.0)
Platelets: 138 10*3/uL — ABNORMAL LOW (ref 150–400)
RBC: 3.58 MIL/uL — AB (ref 3.87–5.11)
RDW: 12.8 % (ref 11.5–15.5)
WBC: 5.5 10*3/uL (ref 4.0–10.5)

## 2016-07-28 LAB — BASIC METABOLIC PANEL
ANION GAP: 5 (ref 5–15)
BUN: 20 mg/dL (ref 6–20)
CALCIUM: 8.6 mg/dL — AB (ref 8.9–10.3)
CO2: 28 mmol/L (ref 22–32)
CREATININE: 1.22 mg/dL — AB (ref 0.44–1.00)
Chloride: 103 mmol/L (ref 101–111)
GFR, EST AFRICAN AMERICAN: 59 mL/min — AB (ref >60)
GFR, EST NON AFRICAN AMERICAN: 51 mL/min — AB (ref >60)
Glucose, Bld: 113 mg/dL — ABNORMAL HIGH (ref 65–99)
Potassium: 4.5 mmol/L (ref 3.5–5.1)
Sodium: 136 mmol/L (ref 135–145)

## 2016-07-28 MED ORDER — OXYCODONE HCL 5 MG PO TABS: 5.0000 mg | ORAL_TABLET | ORAL | 0 refills | Status: DC | PRN

## 2016-07-28 NOTE — Progress Notes (Signed)
Discharge home. Home discharge instruction given, no question verbalized.

## 2016-07-28 NOTE — Discharge Summary (Signed)
Physician Discharge Summary  Patient ID: [name] MRN: [id number] DOB/AGE: [birth year] 51 y.o.  Admit date: 07/27/2016 Discharge date: 07/28/2016  Admission Diagnoses: Patient Active Problem List   Diagnosis Date Noted  . Breast cancer of upper-inner quadrant of left female breast ([place]) 07/27/2016  . Genetic testing 03/25/2016  . Family history of breast cancer   . Malignant neoplasm of upper-inner quadrant of left female breast ([place]) 03/06/2016  . Acute on chronic respiratory failure ([place]) 01/31/2016  . Dyspnea on exertion 12/09/2015  . S/P TAH (total abdominal hysterectomy) 06/07/2013  . Acute blood loss anemia 05/14/2013  . Abnormal uterine bleeding 10/13/2012  . Fibroids 10/13/2012  . Anemia 10/13/2012  . VTE (venous thromboembolism) 10/04/2012  . Pulmonary nodule 10/04/2012  . Hypokalemia 10/04/2012    Discharge Diagnoses:  Active Problems:   Breast cancer of upper-inner quadrant of left female breast ([place]) and same as above  Discharged Condition: stable  Hospital Course: Pt was admitted to the floor with tele and continuous pulse ox after left seed loc lumpectomy, sentinel lymph node biopsy, and port placement for left breast cancer, her 2 positive.  She has a history of severe pulmonary hypertension and that is why she was admitted.  She did well overnight.  She did require oxygen around the clock.  She has no evidence of hematoma in any of her surgical sites.  She tolerated oral pain medication. She is able to void. She will be discharged to home in stable condition.  She was instructed to continue using her incentive spirometry at home.      Consults: None  Significant Diagnostic Studies: labs: see epic.  HCT on d/c 35.  Cr 1.22.    Treatments: surgery: see above.  Discharge Exam: Blood pressure (!) 109/50, pulse (!) 54, temperature 98.6 F (37 C), temperature source Oral, resp. rate 16, height _0  (1.651 m), weight 105.7 kg (233 lb 0.4 oz), last menstrual  period 05/11/2013, SpO2 100 %. General appearance: alert, cooperative and no distress Resp: breathing comfortably Breasts: no hematoma.    Disposition: 01-Home or Self Care  Discharge Instructions    Call MD for:  persistant dizziness or light-headedness    Complete by:  As directed    Call MD for:  persistant nausea and vomiting    Complete by:  As directed    Call MD for:  redness, tenderness, or signs of infection (pain, swelling, redness, odor or green/yellow discharge around incision site)    Complete by:  As directed    Call MD for:  severe uncontrolled pain    Complete by:  As directed    Call MD for:  temperature >100.4    Complete by:  As directed    Diet - low sodium heart healthy    Complete by:  As directed    Increase activity slowly    Complete by:  As directed      Allergies as of 07/28/2016      Reactions   Amoxicillin Other (See Comments)   LIGHT-HEADED and CLOSE TO "PASSING OUT" ABDOMINAL PAIN Has patient had a PCN reaction causing immediate rash, facial/tongue/throat swelling, SOB or lightheadedness with hypotension: No Has patient had a PCN reaction causing severe rash involving mucus membranes or skin necrosis: No Has patient had a PCN reaction that required hospitalization No Has patient had a PCN reaction occurring within the last 10 years: No If all of the above answers are "NO", then may proceed with Cephalosporin use.   Ampicillin  Other (See Comments)   Severe abdominal pain, dizziness (penicillin is okay)   Sulfa Antibiotics Rash, Other (See Comments)   Very bad yeast infection      Medication List    TAKE these medications   acetaminophen 500 MG tablet Commonly known as:  TYLENOL Take 1,000 mg by mouth every 6 (six) hours as needed for moderate pain or headache.   albuterol 108 (90 Base) MCG/ACT inhaler Commonly known as:  PROVENTIL HFA;VENTOLIN HFA Inhale 2 puffs into the lungs every 6 (six) hours as needed for wheezing or shortness of  breath.   anastrozole 1 MG tablet Commonly known as:  ARIMIDEX Take 1 mg by mouth daily.   Azelastine-Fluticasone 137-50 MCG/ACT Susp Place 1 spray into the nose 2 (two) times daily.   BAYER CONTOUR TEST test strip Generic drug:  glucose blood TEST twice a day   cetirizine 10 MG tablet Commonly known as:  ZYRTEC Take 1 tablet (10 mg total) by mouth at bedtime. What changed:  when to take this   chlorpheniramine-HYDROcodone 10-8 MG/5ML Suer Commonly known as:  TUSSIONEX PENNKINETIC ER Take 5 mLs by mouth every 12 (twelve) hours as needed for cough.   fluticasone 50 MCG/ACT nasal spray Commonly known as:  FLONASE Place 2 sprays into both nostrils daily. What changed:  when to take this  reasons to take this   Fluticasone-Salmeterol 250-50 MCG/DOSE Aepb Commonly known as:  ADVAIR DISKUS Inhale 1 puff into the lungs 2 (two) times daily.   insulin glargine 100 UNIT/ML injection Commonly known as:  LANTUS Inject 0.25 mLs (25 Units total) into the skin at bedtime.   levofloxacin 750 MG tablet Commonly known as:  LEVAQUIN Take 1 tablet (750 mg total) by mouth daily.   losartan 100 MG tablet Commonly known as:  COZAAR Take 1 tablet (100 mg total) by mouth daily. What changed:  how much to take   metFORMIN 500 MG tablet Commonly known as:  GLUCOPHAGE Take 1 tablet (500 mg total) by mouth 2 (two) times daily with a meal.   metoprolol tartrate 25 MG tablet Commonly known as:  LOPRESSOR Take 1 tablet (25 mg total) by mouth 2 (two) times daily.   OPSUMIT 10 MG Tabs Generic drug:  Macitentan Take 10 mg by mouth daily.   oxyCODONE 5 MG immediate release tablet Commonly known as:  Oxy IR/ROXICODONE Take 1-2 tablets (5-10 mg total) by mouth every 4 (four) hours as needed for moderate pain.   promethazine-codeine 6.25-10 MG/5ML syrup Commonly known as:  PHENERGAN with CODEINE Take 5 mLs by mouth every 4 (four) hours as needed for cough.   sildenafil 20 MG  tablet Commonly known as:  REVATIO Take 20 mg by mouth 3 (three) times daily.   spironolactone 50 MG tablet Commonly known as:  ALDACTONE Take 50 mg by mouth daily.   SYSTANE OP Apply 1 drop to eye daily as needed (dry eyes).   torsemide 20 MG tablet Commonly known as:  DEMADEX Take 20 mg by mouth 2 (two) times daily.   UPTRAVI 200 MCG Tabs Generic drug:  Selexipag Take 200 mcg by mouth 2 (two) times daily.      Follow-up Information    Mileidy Atkin, MD Follow up in 2 week(s).   Specialty:  General Surgery Contact information: [address] [place] [address] [phone]           Signed: Stark Klein 07/28/2016, 8:47 AM

## 2016-07-29 ENCOUNTER — Encounter: Payer: Self-pay | Admitting: Hematology and Oncology

## 2016-07-29 NOTE — Anesthesia Procedure Notes (Addendum)
Anesthesia Regional Block: Pectoralis block   Pre-Anesthetic Checklist: ,, timeout performed, Correct Patient, Correct Site, Correct Laterality, Correct Procedure, Correct Position, site marked, Risks and benefits discussed,  Surgical consent,  Pre-op evaluation,  At surgeon's request and post-op pain management  Laterality: Left  Prep: chloraprep       Needles:  Injection technique: Single-shot  Needle Type: Echogenic Stimulator Needle          Additional Needles:   Narrative:  Start time: 07/27/2016 7:00 AM End time: 07/27/2016 7:05 AM Injection made incrementally with aspirations every 5 mL.  Performed by: Personally  Anesthesiologist: Paul Trettin  Additional Notes: 30 cc 0.5% Bupivacaine with 1:200 epi

## 2016-07-29 NOTE — Addendum Note (Signed)
Addendum  created 07/29/16 3291 by Roberts Gaudy, MD   Anesthesia Intra Blocks edited, Child order released for a procedure order, Sign clinical note

## 2016-07-29 NOTE — Progress Notes (Signed)
Patient called to advise me that she had her surgery already. Advised her that I will call to set up an appointment after she has met with the doctor and her treatment plan is in to advise her of what may be available and what she will need to bring. Patient verbalized understanding and has my number for any additional questions or concerns. Patient also states she completed the application for the [employer]. Advised her that she may bring it at her next visit and I will give to the Education officer, museum.

## 2016-07-31 ENCOUNTER — Encounter: Payer: Self-pay | Admitting: *Deleted

## 2016-07-31 NOTE — Progress Notes (Signed)
[place] Work  Clinical Social Work was referred by patient for assistance due to financial concerns.  Clinical Social Worker contactedt patient at home to check on applications for assistance. Pt plans to bring documents on Monday, May 7 for [employer] in [place], [place] and to see [place]. CSW also educated pt on [employer], [place] and [employer]. Pt started [employer]. CSW to work on paperwork and submit accordingly. CSW team to follow up on Monday, May after medonc appt.     Clinical Social Work interventions:  Resource assistance and referral  Loren Racer, LCSW, OSW-C Clinical Social Worker [place]  [place] Phone: [phone] Fax: [phone]

## 2016-08-03 ENCOUNTER — Encounter: Payer: Self-pay | Admitting: *Deleted

## 2016-08-03 ENCOUNTER — Encounter: Payer: Self-pay | Admitting: Hematology and Oncology

## 2016-08-03 ENCOUNTER — Ambulatory Visit (HOSPITAL_BASED_OUTPATIENT_CLINIC_OR_DEPARTMENT_OTHER): Payer: BLUE CROSS/BLUE SHIELD | Admitting: Hematology and Oncology

## 2016-08-03 DIAGNOSIS — Z170 Estrogen receptor positive status [ER+]: Secondary | ICD-10-CM

## 2016-08-03 DIAGNOSIS — C50212 Malignant neoplasm of upper-inner quadrant of left female breast: Secondary | ICD-10-CM | POA: Diagnosis not present

## 2016-08-03 NOTE — Progress Notes (Signed)
Patient came in to discuss [place] again. Advised patient she may apply once she begins treatment(Chemotherapy or Radiation). Patient also had Cancer Care application in her email that I printed for her. Will give to Abigail to complete her portion and send back. Gave patient an application for the [employer] to complete. She completed while in my office. Will have RN/physician to complete their portion and send back. Patient will email me a copy of her lease agreement for assistance with rent to submit with [facility] application.  Abigail(SW) has her income documents for [employer] application. Patient has my card for any additional financial questions or concerns.

## 2016-08-03 NOTE — Assessment & Plan Note (Signed)
Left lumpectomy 07/28/2016: IDC grade 2, 2.6 cm, DCIS intermediate grade,0/3 lymph nodes negative, ER 60%, PR 0%, Ki-67 30%, HER-2 positive ratio 6.04, T2 N0 stage II a  Pathology counseling: I discussed the final pathology report of the patient provided  a copy of this report. I discussed the margins as well as lymph node surgeries. We also discussed the final staging along with previously performed ER/PR and HER-2/neu testing.  Recommendation: 1. Adjuvant chemotherapy with Abraxane and Herceptin (cannot take Taxol because she could not receive steroids) weekly 12 followed by every 3 week Herceptin for one year 2. followed by adjuvant radiation 3. Followed by adjuvant antiestrogen therapy  Return to clinic in 3 weeks to start chemotherapy. She will need echocardiogram, chemotherapy class prior to that.

## 2016-08-03 NOTE — Progress Notes (Signed)
[place] Work  Holiday representative met with patient in [place] office at [facility] to review financial assistance applications.  CSW and patient reviewed applications for [employer] in [place], [employer] [place].  Patient will collect remaninig documents.  Once returned CSW will submit applications.  Johnnye Lana, MSW, LCSW, OSW-C Clinical Social Worker [facility] [phone]

## 2016-08-03 NOTE — Progress Notes (Signed)
Patient Care Team: McLean-Scocuzza, Nino Glow, MD as PCP - General (Internal Medicine) Vonna Drafts, FNP as Nurse Practitioner (Nurse Practitioner) Stark Klein, MD as Consulting Physician (General Surgery) Nicholas Lose, MD as Consulting Physician (Hematology and Oncology) Gery Pray, MD as Consulting Physician (Radiation Oncology)  DIAGNOSIS:  Encounter Diagnosis  Name Primary?  . Malignant neoplasm of upper-inner quadrant of left breast in female, estrogen receptor positive ([place])     SUMMARY OF ONCOLOGIC HISTORY:   Malignant neoplasm of upper-inner quadrant of left female breast ([place])   03/04/2016 Initial Diagnosis    Screening detected left breast density posterior medial 1.4 cm by ultrasound axilla negative, grade 3 IDC ER 60%, PR 0%, Ki-67 30%, HER-2 positive ratio 6.04, copy #16; T1 cN0 stage IA clinical stage      03/21/2016 Genetic Testing    NEgative genetic testing on the comprehensive cancer panel and Negative genetic testing for the MSH2 inversion analysis ([place] inversion). The Comprehensive Cancer Panel offered by [facility] includes sequencing and/or deletion duplication testing of the following 32 genes: APC, ATM, AXIN2, BARD1, BMPR1A, BRCA1, BRCA2, BRIP1, CDH1, CDK4, CDKN2A, CHEK2, EPCAM, FANCC, MLH1, MSH2, MSH6, MUTYH, NBN, PALB2, PMS2, POLD1, POLE, PTEN, RAD51C, RAD51D, SCG5/GREM1, SMAD4, STK11, TP53, VHL, and XRCC2.   The report date is March 21, 2016.      07/27/2016 Surgery    Left lumpectomy: IDC grade 2, 2.6 cm, DCIS intermediate grade,0/3 lymph nodes negative, ER 60%, PR 0%, Ki-67 30%, HER-2 positive ratio 6.04, T2 N0 stage II a       CHIEF COMPLIANT: follow-up after surgery to discuss pathology report  INTERVAL HISTORY: Maleeha Halls is a 51 year old with above-mentioned history left breast cancer treated with lumpectomy that was HER-2 positive. She is here today to discuss a treatment plan. She did very well from the surgery standpoint has  minimal discomfort in the left breast and axilla. She also has fullness in the left breast.   REVIEW OF SYSTEMS:   Constitutional: Denies fevers, chills or abnormal weight loss Eyes: Denies blurriness of vision Ears, nose, mouth, throat, and face: Denies mucositis or sore throat Respiratory: Denies cough, dyspnea or wheezes Cardiovascular: Denies palpitation, chest discomfort Gastrointestinal:  Denies nausea, heartburn or change in bowel habits Skin: Denies abnormal skin rashes Lymphatics: Denies new lymphadenopathy or easy bruising Neurological:Denies numbness, tingling or new weaknesses Behavioral/Psych: Mood is stable, no new changes  Extremities: No lower extremity edema Breast: recent lumpectomy surgery All other systems were reviewed with the patient and are negative.  I have reviewed the past medical history, past surgical history, social history and family history with the patient and they are unchanged from previous note.  ALLERGIES:  is allergic to amoxicillin; ampicillin; and sulfa antibiotics.  MEDICATIONS:  Current Outpatient Prescriptions  Medication Sig Dispense Refill  . acetaminophen (TYLENOL) 500 MG tablet Take 1,000 mg by mouth every 6 (six) hours as needed for moderate pain or headache.     . albuterol (PROVENTIL HFA;VENTOLIN HFA) 108 (90 Base) MCG/ACT inhaler Inhale 2 puffs into the lungs every 6 (six) hours as needed for wheezing or shortness of breath. 1 Inhaler 2  . anastrozole (ARIMIDEX) 1 MG tablet Take 1 mg by mouth daily.  1  . Azelastine-Fluticasone 137-50 MCG/ACT SUSP Place 1 spray into the nose 2 (two) times daily. (Patient not taking: Reported on 07/14/2016) 23 g 5  . cetirizine (ZYRTEC) 10 MG tablet Take 1 tablet (10 mg total) by mouth at bedtime. (Patient taking differently: Take 10 mg  by mouth daily. ) 30 tablet 5  . chlorpheniramine-HYDROcodone (TUSSIONEX PENNKINETIC ER) 10-8 MG/5ML SUER Take 5 mLs by mouth every 12 (twelve) hours as needed for cough.  150 mL 0  . fluticasone (FLONASE) 50 MCG/ACT nasal spray Place 2 sprays into both nostrils daily. (Patient taking differently: Place 2 sprays into both nostrils daily as needed for allergies. ) 16 g 5  . Fluticasone-Salmeterol (ADVAIR DISKUS) 250-50 MCG/DOSE AEPB Inhale 1 puff into the lungs 2 (two) times daily. 60 each 5  . glucose blood (BAYER CONTOUR TEST) test strip TEST twice a day    . insulin glargine (LANTUS) 100 UNIT/ML injection Inject 0.25 mLs (25 Units total) into the skin at bedtime. (Patient not taking: Reported on 07/14/2016) 10 mL 0  . losartan (COZAAR) 100 MG tablet Take 1 tablet (100 mg total) by mouth daily. (Patient taking differently: Take 50 mg by mouth daily. ) 30 tablet 0  . metFORMIN (GLUCOPHAGE) 500 MG tablet Take 1 tablet (500 mg total) by mouth 2 (two) times daily with a meal. 60 tablet 2  . metoprolol tartrate (LOPRESSOR) 25 MG tablet Take 1 tablet (25 mg total) by mouth 2 (two) times daily. 60 tablet 2  . OPSUMIT 10 MG TABS Take 10 mg by mouth daily.  8  . oxyCODONE (OXY IR/ROXICODONE) 5 MG immediate release tablet Take 1-2 tablets (5-10 mg total) by mouth every 4 (four) hours as needed for moderate pain. 30 tablet 0  . Polyethyl Glycol-Propyl Glycol (SYSTANE OP) Apply 1 drop to eye daily as needed (dry eyes).    . promethazine-codeine (PHENERGAN WITH CODEINE) 6.25-10 MG/5ML syrup Take 5 mLs by mouth every 4 (four) hours as needed for cough.   0  . sildenafil (REVATIO) 20 MG tablet Take 20 mg by mouth 3 (three) times daily.    Marland Kitchen spironolactone (ALDACTONE) 50 MG tablet Take 50 mg by mouth daily.     Marland Kitchen torsemide (DEMADEX) 20 MG tablet Take 20 mg by mouth 2 (two) times daily.    Marland Kitchen UPTRAVI 200 MCG TABS Take 200 mcg by mouth 2 (two) times daily.  10   No current facility-administered medications for this visit.     PHYSICAL EXAMINATION: ECOG PERFORMANCE STATUS: 1 - Symptomatic but completely ambulatory  Vitals:   08/03/16 1141  BP: 110/67  Pulse: 85  Temp: 98 F  (36.7 C)   Filed Weights   08/03/16 1141  Weight: 234 lb 4.8 oz (106.3 kg)    GENERAL:alert, no distress and comfortable SKIN: skin color, texture, turgor are normal, no rashes or significant lesions EYES: normal, Conjunctiva are pink and non-injected, sclera clear OROPHARYNX:no exudate, no erythema and lips, buccal mucosa, and tongue normal  NECK: supple, thyroid normal size, non-tender, without nodularity LYMPH:  no palpable lymphadenopathy in the cervical, axillary or inguinal LUNGS: clear to auscultation and percussion with normal breathing effort HEART: regular rate & rhythm and no murmurs and no lower extremity edema ABDOMEN:abdomen soft, non-tender and normal bowel sounds MUSCULOSKELETAL:no cyanosis of digits and no clubbing  NEURO: alert & oriented x 3 with fluent speech, no focal motor/sensory deficits EXTREMITIES: No lower extremity edema  LABORATORY DATA:  I have reviewed the data as listed   Chemistry      Component Value Date/Time   NA 136 07/28/2016 0335   NA 140 03/11/2016 0900   K 4.5 07/28/2016 0335   K 3.8 03/11/2016 0900   CL 103 07/28/2016 0335   CO2 28 07/28/2016 0335   CO2  26 03/11/2016 0900   BUN 20 07/28/2016 0335   BUN 12.5 03/11/2016 0900   CREATININE 1.22 (H) 07/28/2016 0335   CREATININE 0.8 03/11/2016 0900      Component Value Date/Time   CALCIUM 8.6 (L) 07/28/2016 0335   CALCIUM 9.4 03/11/2016 0900   ALKPHOS 109 03/11/2016 0900   AST 17 03/11/2016 0900   ALT 13 03/11/2016 0900   BILITOT 1.39 (H) 03/11/2016 0900       Lab Results  Component Value Date   WBC 5.5 07/28/2016   HGB 11.5 (L) 07/28/2016   HCT 35.8 (L) 07/28/2016   MCV 100.0 07/28/2016   PLT 138 (L) 07/28/2016   NEUTROABS 4.5 03/11/2016    ASSESSMENT & PLAN:  Malignant neoplasm of upper-inner quadrant of left female breast ([place]) Left lumpectomy 07/28/2016: IDC grade 2, 2.6 cm, DCIS intermediate grade,0/3 lymph nodes negative, ER 60%, PR 0%, Ki-67 30%, HER-2 positive  ratio 6.04, T2 N0 stage II a  Pathology counseling: I discussed the final pathology report of the patient provided  a copy of this report. I discussed the margins as well as lymph node surgeries. We also discussed the final staging along with previously performed ER/PR and HER-2/neu testing.  Recommendation: 1. Adjuvant chemotherapy with Abraxane and Herceptin (cannot take Taxol because she could not receive steroids) weekly 12 followed by every 3 week Herceptin for one year 2. followed by adjuvant radiation 3. Followed by adjuvant antiestrogen therapy  Return to clinic in 3 weeks to start chemotherapy. She has undergone echocardiogram, chemotherapy class. We will continue the humeral class   I spent 25 minutes talking to the patient of which more than half was spent in counseling and coordination of care.  No orders of the defined types were placed in this encounter.  The patient has a good understanding of the overall plan. she agrees with it. she will call with any problems that may develop before the next visit here.   Rulon Eisenmenger, MD 08/03/16

## 2016-08-03 NOTE — Progress Notes (Unsigned)
Patient came in to discuss [place].

## 2016-08-04 ENCOUNTER — Other Ambulatory Visit: Payer: Self-pay | Admitting: Hematology and Oncology

## 2016-08-04 DIAGNOSIS — C50212 Malignant neoplasm of upper-inner quadrant of left female breast: Secondary | ICD-10-CM

## 2016-08-04 DIAGNOSIS — Z170 Estrogen receptor positive status [ER+]: Principal | ICD-10-CM

## 2016-08-04 MED ORDER — ONDANSETRON HCL 8 MG PO TABS: 8.0000 mg | ORAL_TABLET | Freq: Two times a day (BID) | ORAL | 1 refills | Status: DC | PRN

## 2016-08-04 MED ORDER — LIDOCAINE-PRILOCAINE 2.5-2.5 % EX CREA: TOPICAL_CREAM | CUTANEOUS | 3 refills | Status: DC

## 2016-08-04 MED ORDER — PROCHLORPERAZINE MALEATE 10 MG PO TABS: 10.0000 mg | ORAL_TABLET | Freq: Four times a day (QID) | ORAL | 1 refills | Status: DC | PRN

## 2016-08-07 DIAGNOSIS — D509 Iron deficiency anemia, unspecified: Secondary | ICD-10-CM | POA: Diagnosis not present

## 2016-08-07 DIAGNOSIS — Z853 Personal history of malignant neoplasm of breast: Secondary | ICD-10-CM | POA: Diagnosis not present

## 2016-08-07 DIAGNOSIS — G4733 Obstructive sleep apnea (adult) (pediatric): Secondary | ICD-10-CM | POA: Diagnosis not present

## 2016-08-07 DIAGNOSIS — E119 Type 2 diabetes mellitus without complications: Secondary | ICD-10-CM | POA: Diagnosis not present

## 2016-08-07 DIAGNOSIS — E669 Obesity, unspecified: Secondary | ICD-10-CM | POA: Diagnosis not present

## 2016-08-07 DIAGNOSIS — I10 Essential (primary) hypertension: Secondary | ICD-10-CM | POA: Diagnosis not present

## 2016-08-12 ENCOUNTER — Encounter: Payer: Self-pay | Admitting: *Deleted

## 2016-08-12 NOTE — Progress Notes (Signed)
   [facility] Clinical Social Work  Hol[employer] representative contacted patient via phone to follow up on financial assistance applications.  CSW and patient have completed Cancer Care and submitted.  Patient still collecting remaninig documents and will bring when she returns on Monday after chemo educaion.  Once all returned,  CSW will submit remaining applications.   Clinical Social Work interventions: Resource assistance  Loren Racer, [employer], OSW-C Clinical Social Worker [place]  [place] Phone: [phone] Fax: [phone]

## 2016-08-13 DIAGNOSIS — G4733 Obstructive sleep apnea (adult) (pediatric): Secondary | ICD-10-CM | POA: Diagnosis not present

## 2016-08-13 DIAGNOSIS — I2723 Pulmonary hypertension due to lung diseases and hypoxia: Secondary | ICD-10-CM | POA: Diagnosis not present

## 2016-08-13 DIAGNOSIS — Z79811 Long term (current) use of aromatase inhibitors: Secondary | ICD-10-CM | POA: Diagnosis not present

## 2016-08-13 DIAGNOSIS — Z882 Allergy status to sulfonamides status: Secondary | ICD-10-CM | POA: Diagnosis not present

## 2016-08-13 DIAGNOSIS — E1159 Type 2 diabetes mellitus with other circulatory complications: Secondary | ICD-10-CM | POA: Diagnosis not present

## 2016-08-13 DIAGNOSIS — C50212 Malignant neoplasm of upper-inner quadrant of left female breast: Secondary | ICD-10-CM | POA: Diagnosis not present

## 2016-08-13 DIAGNOSIS — J9621 Acute and chronic respiratory failure with hypoxia: Secondary | ICD-10-CM | POA: Diagnosis not present

## 2016-08-13 DIAGNOSIS — Z6839 Body mass index (BMI) 39.0-39.9, adult: Secondary | ICD-10-CM | POA: Diagnosis not present

## 2016-08-13 DIAGNOSIS — I5032 Chronic diastolic (congestive) heart failure: Secondary | ICD-10-CM | POA: Diagnosis not present

## 2016-08-13 DIAGNOSIS — Z7984 Long term (current) use of oral hypoglycemic drugs: Secondary | ICD-10-CM | POA: Diagnosis not present

## 2016-08-13 DIAGNOSIS — Z9981 Dependence on supplemental oxygen: Secondary | ICD-10-CM | POA: Diagnosis not present

## 2016-08-13 DIAGNOSIS — Z86718 Personal history of other venous thrombosis and embolism: Secondary | ICD-10-CM | POA: Diagnosis not present

## 2016-08-13 DIAGNOSIS — Z7982 Long term (current) use of aspirin: Secondary | ICD-10-CM | POA: Diagnosis not present

## 2016-08-13 DIAGNOSIS — I110 Hypertensive heart disease with heart failure: Secondary | ICD-10-CM | POA: Diagnosis not present

## 2016-08-13 DIAGNOSIS — R0902 Hypoxemia: Secondary | ICD-10-CM | POA: Diagnosis not present

## 2016-08-13 DIAGNOSIS — Z90710 Acquired absence of both cervix and uterus: Secondary | ICD-10-CM | POA: Diagnosis not present

## 2016-08-13 DIAGNOSIS — Z79899 Other long term (current) drug therapy: Secondary | ICD-10-CM | POA: Diagnosis not present

## 2016-08-13 DIAGNOSIS — Z170 Estrogen receptor positive status [ER+]: Secondary | ICD-10-CM | POA: Diagnosis not present

## 2016-08-17 ENCOUNTER — Other Ambulatory Visit: Payer: Self-pay | Admitting: *Deleted

## 2016-08-17 ENCOUNTER — Telehealth: Payer: Self-pay | Admitting: *Deleted

## 2016-08-17 ENCOUNTER — Other Ambulatory Visit: Payer: Self-pay

## 2016-08-17 ENCOUNTER — Other Ambulatory Visit: Payer: Self-pay | Admitting: Hematology and Oncology

## 2016-08-17 DIAGNOSIS — C50212 Malignant neoplasm of upper-inner quadrant of left female breast: Secondary | ICD-10-CM

## 2016-08-17 DIAGNOSIS — Z170 Estrogen receptor positive status [ER+]: Principal | ICD-10-CM

## 2016-08-17 NOTE — Telephone Encounter (Signed)
Spoke with patient about her chemo education class. She forgot about it.  Rescheduled for 08/25/16 at 930am and then patient will walk over to [facility] for her echo at 11am.  Patient aware of appoitntments.

## 2016-08-21 ENCOUNTER — Other Ambulatory Visit (HOSPITAL_COMMUNITY): Payer: Self-pay

## 2016-08-21 DIAGNOSIS — G4733 Obstructive sleep apnea (adult) (pediatric): Secondary | ICD-10-CM | POA: Diagnosis not present

## 2016-08-25 ENCOUNTER — Ambulatory Visit (HOSPITAL_COMMUNITY)
Admission: RE | Admit: 2016-08-25 | Discharge: 2016-08-25 | Disposition: A | Discharge status: Home / Self Care | Payer: BLUE CROSS/BLUE SHIELD | Source: Ambulatory Visit | Attending: Hematology and Oncology | Admitting: Hematology and Oncology

## 2016-08-25 ENCOUNTER — Encounter: Payer: Self-pay | Admitting: *Deleted

## 2016-08-25 ENCOUNTER — Other Ambulatory Visit: Payer: Self-pay

## 2016-08-25 DIAGNOSIS — Z86711 Personal history of pulmonary embolism: Secondary | ICD-10-CM | POA: Insufficient documentation

## 2016-08-25 DIAGNOSIS — C50212 Malignant neoplasm of upper-inner quadrant of left female breast: Secondary | ICD-10-CM | POA: Diagnosis not present

## 2016-08-25 DIAGNOSIS — I272 Pulmonary hypertension, unspecified: Secondary | ICD-10-CM | POA: Diagnosis not present

## 2016-08-25 DIAGNOSIS — Z170 Estrogen receptor positive status [ER+]: Secondary | ICD-10-CM | POA: Insufficient documentation

## 2016-08-25 NOTE — Progress Notes (Signed)
  Echocardiogram 2D Echocardiogram has been performed.  Darlina Sicilian M 08/25/2016, 12:16 PM

## 2016-08-26 DIAGNOSIS — G4733 Obstructive sleep apnea (adult) (pediatric): Secondary | ICD-10-CM | POA: Diagnosis not present

## 2016-08-28 ENCOUNTER — Other Ambulatory Visit: Payer: Self-pay | Admitting: Adult Health

## 2016-08-30 ENCOUNTER — Other Ambulatory Visit: Payer: Self-pay | Admitting: Hematology and Oncology

## 2016-09-01 ENCOUNTER — Encounter: Payer: Self-pay | Admitting: Hematology and Oncology

## 2016-09-01 ENCOUNTER — Ambulatory Visit (HOSPITAL_BASED_OUTPATIENT_CLINIC_OR_DEPARTMENT_OTHER): Payer: BLUE CROSS/BLUE SHIELD

## 2016-09-01 ENCOUNTER — Ambulatory Visit (HOSPITAL_BASED_OUTPATIENT_CLINIC_OR_DEPARTMENT_OTHER): Payer: BLUE CROSS/BLUE SHIELD | Admitting: Hematology and Oncology

## 2016-09-01 ENCOUNTER — Encounter: Payer: Self-pay | Admitting: *Deleted

## 2016-09-01 ENCOUNTER — Other Ambulatory Visit (HOSPITAL_BASED_OUTPATIENT_CLINIC_OR_DEPARTMENT_OTHER): Payer: BLUE CROSS/BLUE SHIELD

## 2016-09-01 VITALS — BP 78/54 | HR 77 | Temp 98.5°F | Resp 20

## 2016-09-01 DIAGNOSIS — Z170 Estrogen receptor positive status [ER+]: Principal | ICD-10-CM

## 2016-09-01 DIAGNOSIS — Z5112 Encounter for antineoplastic immunotherapy: Secondary | ICD-10-CM | POA: Diagnosis not present

## 2016-09-01 DIAGNOSIS — Z5111 Encounter for antineoplastic chemotherapy: Secondary | ICD-10-CM

## 2016-09-01 DIAGNOSIS — C50212 Malignant neoplasm of upper-inner quadrant of left female breast: Secondary | ICD-10-CM

## 2016-09-01 LAB — COMPREHENSIVE METABOLIC PANEL
ALBUMIN: 3.8 g/dL (ref 3.5–5.0)
ALK PHOS: 90 U/L (ref 40–150)
ALT: 16 U/L (ref 0–55)
AST: 20 U/L (ref 5–34)
Anion Gap: 11 mEq/L (ref 3–11)
BUN: 30.8 mg/dL — ABNORMAL HIGH (ref 7.0–26.0)
CHLORIDE: 101 meq/L (ref 98–109)
CO2: 27 mEq/L (ref 22–29)
Calcium: 9.7 mg/dL (ref 8.4–10.4)
Creatinine: 1.7 mg/dL — ABNORMAL HIGH (ref 0.6–1.1)
EGFR: 41 mL/min/{1.73_m2} — AB (ref >90)
GLUCOSE: 123 mg/dL (ref 70–140)
Potassium: 4.3 mEq/L (ref 3.5–5.1)
SODIUM: 139 meq/L (ref 136–145)
Total Bilirubin: 0.71 mg/dL (ref 0.20–1.20)
Total Protein: 8.4 g/dL — ABNORMAL HIGH (ref 6.4–8.3)

## 2016-09-01 LAB — CBC WITH DIFFERENTIAL/PLATELET
BASO%: 1 % (ref 0.0–2.0)
BASOS ABS: 0.1 10*3/uL (ref 0.0–0.1)
EOS ABS: 0.2 10*3/uL (ref 0.0–0.5)
EOS%: 3.9 % (ref 0.0–7.0)
HCT: 42.8 % (ref 34.8–46.6)
HEMOGLOBIN: 14.7 g/dL (ref 11.6–15.9)
LYMPH%: 25.1 % (ref 14.0–49.7)
MCH: 33.4 pg (ref 25.1–34.0)
MCHC: 34.2 g/dL (ref 31.5–36.0)
MCV: 97.7 fL (ref 79.5–101.0)
MONO#: 0.5 10*3/uL (ref 0.1–0.9)
MONO%: 10.1 % (ref 0.0–14.0)
NEUT#: 3.2 10*3/uL (ref 1.5–6.5)
NEUT%: 59.9 % (ref 38.4–76.8)
Platelets: 180 10*3/uL (ref 145–400)
RBC: 4.39 10*6/uL (ref 3.70–5.45)
RDW: 13 % (ref 11.2–14.5)
WBC: 5.3 10*3/uL (ref 3.9–10.3)
lymph#: 1.3 10*3/uL (ref 0.9–3.3)

## 2016-09-01 MED ORDER — SODIUM CHLORIDE 0.9% FLUSH
10.0000 mL | INTRAVENOUS | Status: DC | PRN
  Administered 2016-09-01: 10 mL
  Filled 2016-09-01: qty 10

## 2016-09-01 MED ORDER — PROCHLORPERAZINE MALEATE 10 MG PO TABS
10.0000 mg | ORAL_TABLET | Freq: Once | ORAL | Status: AC
Start: 2016-09-01 — End: 2016-09-01
  Administered 2016-09-01: 10 mg via ORAL

## 2016-09-01 MED ORDER — SODIUM CHLORIDE 0.9 % IV SOLN
8.0000 mg | Freq: Once | INTRAVENOUS | Status: DC
  Administered 2016-09-01: 8 mg via INTRAVENOUS

## 2016-09-01 MED ORDER — ACETAMINOPHEN 325 MG PO TABS
650.0000 mg | ORAL_TABLET | Freq: Once | ORAL | Status: AC
  Administered 2016-09-01: 650 mg via ORAL

## 2016-09-01 MED ORDER — SODIUM CHLORIDE 0.9 % IV SOLN
Freq: Once | INTRAVENOUS | Status: AC
  Administered 2016-09-01: 12:00:00 via INTRAVENOUS

## 2016-09-01 MED ORDER — MEPERIDINE HCL 25 MG/ML IJ SOLN
12.5000 mg | Freq: Once | INTRAMUSCULAR | Status: AC
  Administered 2016-09-01: 12.5 mg via INTRAVENOUS

## 2016-09-01 MED ORDER — PROCHLORPERAZINE MALEATE 10 MG PO TABS
ORAL_TABLET | ORAL | Status: AC
  Filled 2016-09-01: qty 1

## 2016-09-01 MED ORDER — HEPARIN SOD (PORK) LOCK FLUSH 100 UNIT/ML IV SOLN
500.0000 [IU] | Freq: Once | INTRAVENOUS | Status: AC | PRN
  Administered 2016-09-01: 500 [IU]
  Filled 2016-09-01: qty 5

## 2016-09-01 MED ORDER — DEXAMETHASONE SODIUM PHOSPHATE 10 MG/ML IJ SOLN: 8.0000 mg | Freq: Once | INTRAMUSCULAR | Status: DC

## 2016-09-01 MED ORDER — MEPERIDINE HCL 25 MG/ML IJ SOLN
INTRAMUSCULAR | Status: AC
  Filled 2016-09-01: qty 1

## 2016-09-01 MED ORDER — ACETAMINOPHEN 325 MG PO TABS
ORAL_TABLET | ORAL | Status: AC
  Filled 2016-09-01: qty 2

## 2016-09-01 MED ORDER — PACLITAXEL PROTEIN-BOUND CHEMO INJECTION 100 MG
80.0000 mg/m2 | Freq: Once | INTRAVENOUS | Status: AC
  Administered 2016-09-01: 175 mg via INTRAVENOUS
  Filled 2016-09-01: qty 35

## 2016-09-01 MED ORDER — DIPHENHYDRAMINE HCL 25 MG PO CAPS
50.0000 mg | ORAL_CAPSULE | Freq: Once | ORAL | Status: AC
  Administered 2016-09-01: 50 mg via ORAL

## 2016-09-01 MED ORDER — DIPHENHYDRAMINE HCL 25 MG PO CAPS
ORAL_CAPSULE | ORAL | Status: AC
  Filled 2016-09-01: qty 2

## 2016-09-01 MED ORDER — DEXAMETHASONE SODIUM PHOSPHATE 10 MG/ML IJ SOLN
INTRAMUSCULAR | Status: AC
  Filled 2016-09-01: qty 1

## 2016-09-01 MED ORDER — TRASTUZUMAB CHEMO 150 MG IV SOLR
4.0000 mg/kg | Freq: Once | INTRAVENOUS | Status: AC
  Administered 2016-09-01: 420 mg via INTRAVENOUS
  Filled 2016-09-01: qty 20

## 2016-09-01 NOTE — Assessment & Plan Note (Signed)
Left lumpectomy 07/28/2016: IDC grade 2, 2.6 cm, DCIS intermediate grade,0/3 lymph nodes negative, ER 60%, PR 0%, Ki-67 30%, HER-2 positive ratio 6.04, T2 N0 stage II a  Pathology counseling: I discussed the final pathology report of the patient provided  a copy of this report. I discussed the margins as well as lymph node surgeries. We also discussed the final staging along with previously performed ER/PR and HER-2/neu testing.  Recommendation: 1. Adjuvant chemotherapy with Abraxane and Herceptin (cannot take Taxol because she could not receive steroids) weekly 12 followed by every 3 week Herceptin for one year 2. followed by adjuvant radiation 3. Followed by adjuvant antiestrogen therapy  Return to clinic in 3 weeks to start chemotherapy. She has undergone echocardiogram, chemotherapy class. We will continue the humeral class

## 2016-09-01 NOTE — Patient Instructions (Addendum)
[facility] Discharge Instructions for Patients Receiving Chemotherapy  Today you received the following chemotherapy agents Herceptin and Abraxane   To help prevent nausea and vomiting after your treatment, we encourage you to take your nausea medication as directed.    If you develop nausea and vomiting that is not controlled by your nausea medication, call the clinic.   BELOW ARE SYMPTOMS THAT SHOULD BE REPORTED IMMEDIATELY:  *FEVER GREATER THAN 100.5 F  *CHILLS WITH OR WITHOUT FEVER  NAUSEA AND VOMITING THAT IS NOT CONTROLLED WITH YOUR NAUSEA MEDICATION  *UNUSUAL SHORTNESS OF BREATH  *UNUSUAL BRUISING OR BLEEDING  TENDERNESS IN MOUTH AND THROAT WITH OR WITHOUT PRESENCE OF ULCERS  *URINARY PROBLEMS  *BOWEL PROBLEMS  UNUSUAL RASH Items with * indicate a potential emergency and should be followed up as soon as possible.  Feel free to call the clinic you have any questions or concerns. The clinic phone number is [phone].  Please show the [facility] at check-in to the Emergency Department and triage nurse.  Trastuzumab injection for infusion What is this medicine? TRASTUZUMAB (tras TOO zoo mab) is a monoclonal antibody. It is used to treat breast cancer and stomach cancer. This medicine may be used for other purposes; ask your health care provider or pharmacist if you have questions. COMMON BRAND NAME(S): Herceptin What should I tell my health care provider before I take this medicine? They need to know if you have any of these conditions: -heart disease -heart failure -lung or breathing disease, like asthma -an unusual or allergic reaction to trastuzumab, benzyl alcohol, or other medications, foods, dyes, or preservatives -pregnant or trying to get pregnant -breast-feeding How should I use this medicine? This drug is given as an infusion into a vein. It is administered in a hospital or clinic by a specially trained health care  professional. Talk to your pediatrician regarding the use of this medicine in children. This medicine is not approved for use in children. Overdosage: If you think you have taken too much of this medicine contact a poison control center or emergency room at once. NOTE: This medicine is only for you. Do not share this medicine with others. What if I miss a dose? It is important not to miss a dose. Call your doctor or health care professional if you are unable to keep an appointment. What may interact with this medicine? This medicine may interact with the following medications: -certain types of chemotherapy, such as daunorubicin, doxorubicin, epirubicin, and idarubicin This list may not describe all possible interactions. Give your health care provider a list of all the medicines, herbs, non-prescription drugs, or dietary supplements you use. Also tell them if you smoke, drink alcohol, or use illegal drugs. Some items may interact with your medicine. What should I watch for while using this medicine? Visit your doctor for checks on your progress. Report any side effects. Continue your course of treatment even though you feel ill unless your doctor tells you to stop. Call your doctor or health care professional for advice if you get a fever, chills or sore throat, or other symptoms of a cold or flu. Do not treat yourself. Try to avoid being around people who are sick. You may experience fever, chills and shaking during your first infusion. These effects are usually mild and can be treated with other medicines. Report any side effects during the infusion to your health care professional. Fever and chills usually do not happen with later infusions. Do not  become pregnant while taking this medicine or for 7 months after stopping it. Women should inform their doctor if they wish to become pregnant or think they might be pregnant. Women of child-bearing potential will need to have a negative pregnancy test  before starting this medicine. There is a potential for serious side effects to an unborn child. Talk to your health care professional or pharmacist for more information. Do not breast-feed an infant while taking this medicine or for 7 months after stopping it. Women must use effective birth control with this medicine. What side effects may I notice from receiving this medicine? Side effects that you should report to your doctor or health care professional as soon as possible: -allergic reactions like skin rash, itching or hives, swelling of the face, lips, or tongue -chest pain or palpitations -cough -dizziness -feeling faint or lightheaded, falls -fever -general ill feeling or flu-like symptoms -signs of worsening heart failure like breathing problems; swelling in your legs and feet -unusually weak or tired Side effects that usually do not require medical attention (report to your doctor or health care professional if they continue or are bothersome): -bone pain -changes in taste -diarrhea -joint pain -nausea/vomiting -weight loss This list may not describe all possible side effects. Call your doctor for medical advice about side effects. You may report side effects to FDA at 1-800-FDA-1088. Where should I keep my medicine? This drug is given in a hospital or clinic and will not be stored at home. NOTE: This sheet is a summary. It may not cover all possible information. If you have questions about this medicine, talk to your doctor, pharmacist, or health care provider.  2018 Elsevier/Gold Standard (2016-03-10 14:37:52)   Nanoparticle Albumin-Bound Paclitaxel injection What is this medicine? NANOPARTICLE ALBUMIN-BOUND PACLITAXEL (Na no PAHR ti kuhl al BYOO muhn-bound PAK li TAX el) is a chemotherapy drug. It targets fast dividing cells, like cancer cells, and causes these cells to die. This medicine is used to treat advanced breast cancer and advanced lung cancer. This medicine may be  used for other purposes; ask your health care provider or pharmacist if you have questions. COMMON BRAND NAME(S): Abraxane What should I tell my health care provider before I take this medicine? They need to know if you have any of these conditions: -kidney disease -liver disease -low blood counts, like low platelets, red blood cells, or white blood cells -recent or ongoing radiation therapy -an unusual or allergic reaction to paclitaxel, albumin, other chemotherapy, other medicines, foods, dyes, or preservatives -pregnant or trying to get pregnant -breast-feeding How should I use this medicine? This drug is given as an infusion into a vein. It is administered in a hospital or clinic by a specially trained health care professional. Talk to your pediatrician regarding the use of this medicine in children. Special care may be needed. Overdosage: If you think you have taken too much of this medicine contact a poison control center or emergency room at once. NOTE: This medicine is only for you. Do not share this medicine with others. What if I miss a dose? It is important not to miss your dose. Call your doctor or health care professional if you are unable to keep an appointment. What may interact with this medicine? -cyclosporine -diazepam -ketoconazole -medicines to increase blood counts like filgrastim, pegfilgrastim, sargramostim -other chemotherapy drugs like cisplatin, doxorubicin, epirubicin, etoposide, teniposide, vincristine -quinidine -testosterone -vaccines -verapamil Talk to your doctor or health care professional before taking any of these medicines: -acetaminophen -  aspirin -ibuprofen -ketoprofen -naproxen This list may not describe all possible interactions. Give your health care provider a list of all the medicines, herbs, non-prescription drugs, or dietary supplements you use. Also tell them if you smoke, drink alcohol, or use illegal drugs. Some items may interact with  your medicine. What should I watch for while using this medicine? Your condition will be monitored carefully while you are receiving this medicine. You will need important blood work done while you are taking this medicine. This medicine can cause serious allergic reactions. If you experience allergic reactions like skin rash, itching or hives, swelling of the face, lips, or tongue, tell your doctor or health care professional right away. In some cases, you may be given additional medicines to help with side effects. Follow all directions for their use. This drug may make you feel generally unwell. This is not uncommon, as chemotherapy can affect healthy cells as well as cancer cells. Report any side effects. Continue your course of treatment even though you feel ill unless your doctor tells you to stop. Call your doctor or health care professional for advice if you get a fever, chills or sore throat, or other symptoms of a cold or flu. Do not treat yourself. This drug decreases your body's ability to fight infections. Try to avoid being around people who are sick. This medicine may increase your risk to bruise or bleed. Call your doctor or health care professional if you notice any unusual bleeding. Be careful brushing and flossing your teeth or using a toothpick because you may get an infection or bleed more easily. If you have any dental work done, tell your dentist you are receiving this medicine. Avoid taking products that contain aspirin, acetaminophen, ibuprofen, naproxen, or ketoprofen unless instructed by your doctor. These medicines may hide a fever. Do not become pregnant while taking this medicine. Women should inform their doctor if they wish to become pregnant or think they might be pregnant. There is a potential for serious side effects to an unborn child. Talk to your health care professional or pharmacist for more information. Do not breast-feed an infant while taking this medicine. Men  are advised not to father a child while receiving this medicine. What side effects may I notice from receiving this medicine? Side effects that you should report to your doctor or health care professional as soon as possible: -allergic reactions like skin rash, itching or hives, swelling of the face, lips, or tongue -low blood counts - This drug may decrease the number of white blood cells, red blood cells and platelets. You may be at increased risk for infections and bleeding. -signs of infection - fever or chills, cough, sore throat, pain or difficulty passing urine -signs of decreased platelets or bleeding - bruising, pinpoint red spots on the skin, black, tarry stools, nosebleeds -signs of decreased red blood cells - unusually weak or tired, fainting spells, lightheadedness -breathing problems -changes in vision -chest pain -high or low blood pressure -mouth sores -nausea and vomiting -pain, swelling, redness or irritation at the injection site -pain, tingling, numbness in the hands or feet -slow or irregular heartbeat -swelling of the ankle, feet, hands Side effects that usually do not require medical attention (report to your doctor or health care professional if they continue or are bothersome): -aches, pains -changes in the color of fingernails -diarrhea -hair loss -loss of appetite This list may not describe all possible side effects. Call your doctor for medical advice about side effects. You  may report side effects to FDA at 1-800-FDA-1088. Where should I keep my medicine? This drug is given in a hospital or clinic and will not be stored at home. NOTE: This sheet is a summary. It may not cover all possible information. If you have questions about this medicine, talk to your doctor, pharmacist, or health care provider.  2018 Elsevier/Gold Standard (2015-01-16 10:05:20)

## 2016-09-01 NOTE — Progress Notes (Signed)
Patient Care Team: McLean-Scocuzza, Nino Glow, MD as PCP - General (Internal Medicine) Vonna Drafts, FNP as Nurse Practitioner (Nurse Practitioner) Stark Klein, MD as Consulting Physician (General Surgery) Nicholas Lose, MD as Consulting Physician (Hematology and Oncology) Gery Pray, MD as Consulting Physician (Radiation Oncology)  DIAGNOSIS:  Encounter Diagnosis  Name Primary?  . Malignant neoplasm of upper-inner quadrant of left breast in female, estrogen receptor positive ([place])     SUMMARY OF ONCOLOGIC HISTORY:   Malignant neoplasm of upper-inner quadrant of left female breast ([place])   03/04/2016 Initial Diagnosis    Screening detected left breast density posterior medial 1.4 cm by ultrasound axilla negative, grade 3 IDC ER 60%, PR 0%, Ki-67 30%, HER-2 positive ratio 6.04, copy #16; T1 cN0 stage IA clinical stage      03/21/2016 Genetic Testing    NEgative genetic testing on the comprehensive cancer panel and Negative genetic testing for the MSH2 inversion analysis ([place]and inversion). The Comprehensive Cancer Panel offered by [facility] includes sequencing and/or deletion duplication testing of the following 32 genes: APC, ATM, AXIN2, BARD1, BMPR1A, BRCA1, BRCA2, BRIP1, CDH1, CDK4, CDKN2A, CHEK2, EPCAM, FANCC, MLH1, MSH2, MSH6, MUTYH, NBN, PALB2, PMS2, POLD1, POLE, PTEN, RAD51C, RAD51D, SCG5/GREM1, SMAD4, STK11, TP53, VHL, and XRCC2.   The report date is March 21, 2016.      07/27/2016 Surgery    Left lumpectomy: IDC grade 2, 2.6 cm, DCIS intermediate grade,0/3 lymph nodes negative, ER 60%, PR 0%, Ki-67 30%, HER-2 positive ratio 6.04, T2 N0 stage II a      09/01/2016 -  Chemotherapy    Abraxane Herceptin weekly 12 followed by Herceptin maintenance every 3 weeks        CHIEF COMPLIANT: Cycle 1 day 1 Abraxane Herceptin  INTERVAL HISTORY: [name] is a 51 year old with above-mentioned history of HER-2 positive left breast cancer treated with lumpectomy and is here  today to start for cycle of adjuvant chemotherapy with Herceptin. She is very anxious to get started with the treatment. She is extremely worried about hair loss and nausea.  REVIEW OF SYSTEMS:   Constitutional: Denies fevers, chills or abnormal weight loss Eyes: Denies blurriness of vision Ears, nose, mouth, throat, and face: Denies mucositis or sore throat Respiratory: Denies cough, dyspnea or wheezes Cardiovascular: Denies palpitation, chest discomfort Gastrointestinal:  Denies nausea, heartburn or change in bowel habits Skin: Denies abnormal skin rashes Lymphatics: Denies new lymphadenopathy or easy bruising Neurological:Denies numbness, tingling or new weaknesses Behavioral/Psych: Mood is stable, no new changes  Extremities: No lower extremity edema Breast:  denies any pain or lumps or nodules in either breasts All other systems were reviewed with the patient and are negative.  I have reviewed the past medical history, past surgical history, social history and family history with the patient and they are unchanged from previous note.  ALLERGIES:  is allergic to amoxicillin; ampicillin; and sulfa antibiotics.  MEDICATIONS:  Current Outpatient Prescriptions  Medication Sig Dispense Refill  . acetaminophen (TYLENOL) 500 MG tablet Take 1,000 mg by mouth every 6 (six) hours as needed for moderate pain or headache.     . albuterol (PROVENTIL HFA;VENTOLIN HFA) 108 (90 Base) MCG/ACT inhaler Inhale 2 puffs into the lungs every 6 (six) hours as needed for wheezing or shortness of breath. 1 Inhaler 2  . anastrozole (ARIMIDEX) 1 MG tablet Take 1 mg by mouth daily.  1  . Azelastine-Fluticasone 137-50 MCG/ACT SUSP Place 1 spray into the nose 2 (two) times daily. (Patient not taking: Reported on  07/14/2016) 23 g 5  . cetirizine (ZYRTEC) 10 MG tablet Take 1 tablet (10 mg total) by mouth at bedtime. (Patient taking differently: Take 10 mg by mouth daily. ) 30 tablet 5  . chlorpheniramine-HYDROcodone  (TUSSIONEX PENNKINETIC ER) 10-8 MG/5ML SUER Take 5 mLs by mouth every 12 (twelve) hours as needed for cough. 150 mL 0  . fluticasone (FLONASE) 50 MCG/ACT nasal spray Place 2 sprays into both nostrils daily. (Patient taking differently: Place 2 sprays into both nostrils daily as needed for allergies. ) 16 g 5  . Fluticasone-Salmeterol (ADVAIR DISKUS) 250-50 MCG/DOSE AEPB Inhale 1 puff into the lungs 2 (two) times daily. 60 each 5  . glucose blood (BAYER CONTOUR TEST) test strip TEST twice a day    . insulin glargine (LANTUS) 100 UNIT/ML injection Inject 0.25 mLs (25 Units total) into the skin at bedtime. (Patient not taking: Reported on 07/14/2016) 10 mL 0  . lidocaine-prilocaine (EMLA) cream Apply to affected area once 30 g 3  . losartan (COZAAR) 100 MG tablet Take 1 tablet (100 mg total) by mouth daily. (Patient taking differently: Take 50 mg by mouth daily. ) 30 tablet 0  . metFORMIN (GLUCOPHAGE) 500 MG tablet Take 1 tablet (500 mg total) by mouth 2 (two) times daily with a meal. 60 tablet 2  . metoprolol tartrate (LOPRESSOR) 25 MG tablet Take 1 tablet (25 mg total) by mouth 2 (two) times daily. 60 tablet 2  . OPSUMIT 10 MG TABS Take 10 mg by mouth daily.  8  . oxyCODONE (OXY IR/ROXICODONE) 5 MG immediate release tablet Take 1-2 tablets (5-10 mg total) by mouth every 4 (four) hours as needed for moderate pain. 30 tablet 0  . Polyethyl Glycol-Propyl Glycol (SYSTANE OP) Apply 1 drop to eye daily as needed (dry eyes).    . promethazine-codeine (PHENERGAN WITH CODEINE) 6.25-10 MG/5ML syrup Take 5 mLs by mouth every 4 (four) hours as needed for cough.   0  . sildenafil (REVATIO) 20 MG tablet Take 20 mg by mouth 3 (three) times daily.    Marland Kitchen spironolactone (ALDACTONE) 50 MG tablet Take 50 mg by mouth daily.     Marland Kitchen torsemide (DEMADEX) 20 MG tablet Take 20 mg by mouth 2 (two) times daily.    Marland Kitchen UPTRAVI 200 MCG TABS Take 200 mcg by mouth 2 (two) times daily.  10   No current facility-administered medications  for this visit.    Facility-Administered Medications Ordered in Other Visits  Medication Dose Route Frequency Provider Last Rate Last Dose  . dexamethasone (DECADRON) injection 8 mg  8 mg Intravenous Once Nicholas Lose, MD      . heparin lock flush 100 unit/mL  500 Units Intracatheter Once PRN Nicholas Lose, MD      . PACLitaxel-protein bound (ABRAXANE) chemo infusion 175 mg  80 mg/m2 (Treatment Plan Recorded) Intravenous Once Nicholas Lose, MD      . sodium chloride flush (NS) 0.9 % injection 10 mL  10 mL Intracatheter PRN Nicholas Lose, MD        PHYSICAL EXAMINATION: ECOG PERFORMANCE STATUS: 1 - Symptomatic but completely ambulatory  Vitals:   09/01/16 1030  BP: 132/80  Pulse: 91  Resp: 18  Temp: 98.4 F (36.9 C)   Filed Weights   09/01/16 1030  Weight: 232 lb 11.2 oz (105.6 kg)    GENERAL:alert, no distress and comfortable SKIN: skin color, texture, turgor are normal, no rashes or significant lesions EYES: normal, Conjunctiva are pink and non-injected, sclera clear OROPHARYNX:no  exudate, no erythema and lips, buccal mucosa, and tongue normal  NECK: supple, thyroid normal size, non-tender, without nodularity LYMPH:  no palpable lymphadenopathy in the cervical, axillary or inguinal LUNGS: clear to auscultation and percussion with normal breathing effort HEART: regular rate & rhythm and no murmurs and no lower extremity edema ABDOMEN:abdomen soft, non-tender and normal bowel sounds MUSCULOSKELETAL:no cyanosis of digits and no clubbing  NEURO: alert & oriented x 3 with fluent speech, no focal motor/sensory deficits EXTREMITIES: No lower extremity edema   LABORATORY DATA:  I have reviewed the data as listed   Chemistry      Component Value Date/Time   NA 139 09/01/2016 0956   K 4.3 09/01/2016 0956   CL 103 07/28/2016 0335   CO2 27 09/01/2016 0956   BUN 30.8 (H) 09/01/2016 0956   CREATININE 1.7 (H) 09/01/2016 0956      Component Value Date/Time   CALCIUM 9.7  09/01/2016 0956   ALKPHOS 90 09/01/2016 0956   AST 20 09/01/2016 0956   ALT 16 09/01/2016 0956   BILITOT 0.71 09/01/2016 0956       Lab Results  Component Value Date   WBC 5.3 09/01/2016   HGB 14.7 09/01/2016   HCT 42.8 09/01/2016   MCV 97.7 09/01/2016   PLT 180 09/01/2016   NEUTROABS 3.2 09/01/2016    ASSESSMENT & PLAN:  Malignant neoplasm of upper-inner quadrant of left female breast ([place]) Left lumpectomy 07/28/2016: IDC grade 2, 2.6 cm, DCIS intermediate grade,0/3 lymph nodes negative, ER 60%, PR 0%, Ki-67 30%, HER-2 positive ratio 6.04, T2 N0 stage II a  Pathology counseling: I discussed the final pathology report of the patient provided  a copy of this report. I discussed the margins as well as lymph node surgeries. We also discussed the final staging along with previously performed ER/PR and HER-2/neu testing.  Recommendation: 1. Adjuvant chemotherapy with Abraxane and Herceptin (cannot take Taxol because she could not receive steroids) weekly 12 followed by every 3 week Herceptin for one year 2. followed by adjuvant radiation 3. Followed by adjuvant antiestrogen therapy ------------------------------------------------------------------------------------------------------------------------------- Current treatment: Cycle 1 day 1 Abraxane Herceptin Labs have been reviewed Chemotherapy consent obtained Monitoring closely for toxicities Return to clinic in one week for toxicity evaluation and for cycle 2.  I spent 25 minutes talking to the patient of which more than half was spent in counseling and coordination of care.  No orders of the defined types were placed in this encounter.  The patient has a good understanding of the overall plan. she agrees with it. she will call with any problems that may develop before the next visit here.   Rulon Eisenmenger, MD 09/01/16

## 2016-09-01 NOTE — Progress Notes (Signed)
Ok to treat with creatinine 1.7 per Junious Dresser RN, per MD Lindi Adie.   1342: During Herceptin infusion, patient awakened stating that she was cold. Another warm blanket was provided and her seat heater was turned on. Rigors noted in patient, and patient reported  "I feel so cold like I can't warm up".  1345: MD Gudena notified. Reaction protocol started. Decadron and Demerol given (see eMAR). VSS with hypotension noted (see flowsheets).  1355: Patient noted feeling "much better " and "back to normal"  1437: Per MD Gudena ok to restart the Herceptin. MD Lindi Adie aware of BP (see flowsheets) Patient was able to finish the remaining 9m of Herceptin with no further symptoms.

## 2016-09-07 ENCOUNTER — Telehealth: Payer: Self-pay

## 2016-09-07 NOTE — Telephone Encounter (Signed)
Chemo f/u call.  See flowsheet

## 2016-09-07 NOTE — Assessment & Plan Note (Signed)
Left lumpectomy 07/28/2016: IDC grade 2, 2.6 cm, DCIS intermediate grade,0/3 lymph nodes negative, ER 60%, PR 0%, Ki-67 30%, HER-2 positive ratio 6.04, T2 N0 stage II a  Pathology counseling: I discussed the final pathology report of the patient provided a copy of this report. I discussed the margins as well as lymph node surgeries. We also discussed the final staging along with previously performed ER/PR and HER-2/neu testing.  Recommendation: 1. Adjuvant chemotherapy with Abraxane and Herceptin (cannot take Taxol because she could not receive steroids)weekly 12 followed by every 3 week Herceptin for one year 2. followed by adjuvant radiation 3. Followed by adjuvant antiestrogen therapy ------------------------------------------------------------------------------------------------------------------------------- Current treatment: Cycle 2 day 1 Abraxane Herceptin Labs have been reviewed  Chemo Toxicities:  Return to clinic in 2 weeks for cycle 4.

## 2016-09-08 ENCOUNTER — Encounter: Payer: Self-pay | Admitting: Hematology and Oncology

## 2016-09-08 ENCOUNTER — Encounter: Payer: Self-pay | Admitting: *Deleted

## 2016-09-08 ENCOUNTER — Ambulatory Visit (HOSPITAL_BASED_OUTPATIENT_CLINIC_OR_DEPARTMENT_OTHER): Payer: BLUE CROSS/BLUE SHIELD

## 2016-09-08 ENCOUNTER — Ambulatory Visit: Payer: Self-pay | Admitting: Hematology and Oncology

## 2016-09-08 ENCOUNTER — Ambulatory Visit (HOSPITAL_BASED_OUTPATIENT_CLINIC_OR_DEPARTMENT_OTHER): Payer: BLUE CROSS/BLUE SHIELD | Admitting: Hematology and Oncology

## 2016-09-08 ENCOUNTER — Other Ambulatory Visit (HOSPITAL_BASED_OUTPATIENT_CLINIC_OR_DEPARTMENT_OTHER): Payer: BLUE CROSS/BLUE SHIELD

## 2016-09-08 ENCOUNTER — Ambulatory Visit: Payer: BLUE CROSS/BLUE SHIELD

## 2016-09-08 DIAGNOSIS — Z170 Estrogen receptor positive status [ER+]: Secondary | ICD-10-CM | POA: Diagnosis not present

## 2016-09-08 DIAGNOSIS — D649 Anemia, unspecified: Secondary | ICD-10-CM

## 2016-09-08 DIAGNOSIS — Z5111 Encounter for antineoplastic chemotherapy: Secondary | ICD-10-CM

## 2016-09-08 DIAGNOSIS — Z5112 Encounter for antineoplastic immunotherapy: Secondary | ICD-10-CM

## 2016-09-08 DIAGNOSIS — D259 Leiomyoma of uterus, unspecified: Secondary | ICD-10-CM

## 2016-09-08 DIAGNOSIS — C50212 Malignant neoplasm of upper-inner quadrant of left female breast: Secondary | ICD-10-CM

## 2016-09-08 LAB — CBC WITH DIFFERENTIAL/PLATELET
BASO%: 1.3 % (ref 0.0–2.0)
Basophils Absolute: 0.1 10*3/uL (ref 0.0–0.1)
EOS%: 4.1 % (ref 0.0–7.0)
Eosinophils Absolute: 0.2 10*3/uL (ref 0.0–0.5)
HEMATOCRIT: 36.8 % (ref 34.8–46.6)
HGB: 12.7 g/dL (ref 11.6–15.9)
LYMPH%: 19 % (ref 14.0–49.7)
MCH: 33.3 pg (ref 25.1–34.0)
MCHC: 34.4 g/dL (ref 31.5–36.0)
MCV: 96.8 fL (ref 79.5–101.0)
MONO#: 0.2 10*3/uL (ref 0.1–0.9)
MONO%: 4.4 % (ref 0.0–14.0)
NEUT#: 3.5 10*3/uL (ref 1.5–6.5)
NEUT%: 71.2 % (ref 38.4–76.8)
PLATELETS: 160 10*3/uL (ref 145–400)
RBC: 3.8 10*6/uL (ref 3.70–5.45)
RDW: 12.6 % (ref 11.2–14.5)
WBC: 4.9 10*3/uL (ref 3.9–10.3)
lymph#: 0.9 10*3/uL (ref 0.9–3.3)

## 2016-09-08 LAB — COMPREHENSIVE METABOLIC PANEL
ALK PHOS: 82 U/L (ref 40–150)
ALT: 19 U/L (ref 0–55)
ANION GAP: 11 meq/L (ref 3–11)
AST: 18 U/L (ref 5–34)
Albumin: 3.7 g/dL (ref 3.5–5.0)
BILIRUBIN TOTAL: 0.53 mg/dL (ref 0.20–1.20)
BUN: 24 mg/dL (ref 7.0–26.0)
CHLORIDE: 104 meq/L (ref 98–109)
CO2: 27 mEq/L (ref 22–29)
CREATININE: 1.1 mg/dL (ref 0.6–1.1)
Calcium: 9.5 mg/dL (ref 8.4–10.4)
EGFR: 65 mL/min/{1.73_m2} — ABNORMAL LOW (ref >90)
Glucose: 139 mg/dl (ref 70–140)
Potassium: 4.1 mEq/L (ref 3.5–5.1)
Sodium: 142 mEq/L (ref 136–145)
TOTAL PROTEIN: 7.8 g/dL (ref 6.4–8.3)

## 2016-09-08 MED ORDER — TRASTUZUMAB CHEMO 150 MG IV SOLR
2.0000 mg/kg | Freq: Once | INTRAVENOUS | Status: DC
  Filled 2016-09-08: qty 10

## 2016-09-08 MED ORDER — DIPHENHYDRAMINE HCL 25 MG PO CAPS
50.0000 mg | ORAL_CAPSULE | Freq: Once | ORAL | Status: AC
  Administered 2016-09-08: 50 mg via ORAL

## 2016-09-08 MED ORDER — PROCHLORPERAZINE MALEATE 10 MG PO TABS
10.0000 mg | ORAL_TABLET | Freq: Once | ORAL | Status: AC
  Administered 2016-09-08: 10 mg via ORAL

## 2016-09-08 MED ORDER — FAMOTIDINE IN NACL 20-0.9 MG/50ML-% IV SOLN
20.0000 mg | Freq: Once | INTRAVENOUS | Status: AC
  Administered 2016-09-08: 20 mg via INTRAVENOUS

## 2016-09-08 MED ORDER — HEPARIN SOD (PORK) LOCK FLUSH 100 UNIT/ML IV SOLN
500.0000 [IU] | Freq: Once | INTRAVENOUS | Status: AC | PRN
  Administered 2016-09-08: 500 [IU]
  Filled 2016-09-08: qty 5

## 2016-09-08 MED ORDER — FAMOTIDINE IN NACL 20-0.9 MG/50ML-% IV SOLN
INTRAVENOUS | Status: AC
Start: 2016-09-08 — End: 2016-09-08
  Filled 2016-09-08: qty 50

## 2016-09-08 MED ORDER — ACETAMINOPHEN 325 MG PO TABS
650.0000 mg | ORAL_TABLET | Freq: Once | ORAL | Status: AC
  Administered 2016-09-08: 650 mg via ORAL

## 2016-09-08 MED ORDER — ACETAMINOPHEN 325 MG PO TABS
ORAL_TABLET | ORAL | Status: AC
  Filled 2016-09-08: qty 2

## 2016-09-08 MED ORDER — DIPHENHYDRAMINE HCL 25 MG PO CAPS
ORAL_CAPSULE | ORAL | Status: AC
  Filled 2016-09-08: qty 2

## 2016-09-08 MED ORDER — PACLITAXEL PROTEIN-BOUND CHEMO INJECTION 100 MG
80.0000 mg/m2 | Freq: Once | INTRAVENOUS | Status: AC
  Administered 2016-09-08: 175 mg via INTRAVENOUS
  Filled 2016-09-08: qty 35

## 2016-09-08 MED ORDER — SODIUM CHLORIDE 0.9% FLUSH
10.0000 mL | INTRAVENOUS | Status: DC | PRN
  Administered 2016-09-08: 10 mL
  Filled 2016-09-08: qty 10

## 2016-09-08 MED ORDER — SODIUM CHLORIDE 0.9 % IJ SOLN
10.0000 mL | INTRAMUSCULAR | Status: DC | PRN
  Administered 2016-09-08: 10 mL
  Filled 2016-09-08: qty 10

## 2016-09-08 MED ORDER — TRASTUZUMAB CHEMO 150 MG IV SOLR
2.0000 mg/kg | Freq: Once | INTRAVENOUS | Status: AC
  Administered 2016-09-08: 210 mg via INTRAVENOUS
  Filled 2016-09-08: qty 10

## 2016-09-08 MED ORDER — SODIUM CHLORIDE 0.9 % IV SOLN
Freq: Once | INTRAVENOUS | Status: AC
  Administered 2016-09-08: 11:00:00 via INTRAVENOUS

## 2016-09-08 MED ORDER — PROCHLORPERAZINE MALEATE 10 MG PO TABS
ORAL_TABLET | ORAL | Status: AC
  Filled 2016-09-08: qty 1

## 2016-09-08 NOTE — Progress Notes (Signed)
Patient Care Team: McLean-Scocuzza, Nino Glow, MD as PCP - General (Internal Medicine) Vonna Drafts, FNP as Nurse Practitioner (Nurse Practitioner) Stark Klein, MD as Consulting Physician (General Surgery) Nicholas Lose, MD as Consulting Physician (Hematology and Oncology) Gery Pray, MD as Consulting Physician (Radiation Oncology)  DIAGNOSIS:  Encounter Diagnosis  Name Primary?  . Malignant neoplasm of upper-inner quadrant of left breast in female, estrogen receptor positive ([place])     SUMMARY OF ONCOLOGIC HISTORY:   Malignant neoplasm of upper-inner quadrant of left female breast ([place])   03/04/2016 Initial Diagnosis    Screening detected left breast density posterior medial 1.4 cm by ultrasound axilla negative, grade 3 IDC ER 60%, PR 0%, Ki-67 30%, HER-2 positive ratio 6.04, copy #16; T1 cN0 stage IA clinical stage      03/21/2016 Genetic Testing    NEgative genetic testing on the comprehensive cancer panel and Negative genetic testing for the MSH2 inversion analysis ([place] inversion). The Comprehensive Cancer Panel offered by [facility] includes sequencing and/or deletion duplication testing of the following 32 genes: APC, ATM, AXIN2, BARD1, BMPR1A, BRCA1, BRCA2, BRIP1, CDH1, CDK4, CDKN2A, CHEK2, EPCAM, FANCC, MLH1, MSH2, MSH6, MUTYH, NBN, PALB2, PMS2, POLD1, POLE, PTEN, RAD51C, RAD51D, SCG5/GREM1, SMAD4, STK11, TP53, VHL, and XRCC2.   The report date is March 21, 2016.      07/27/2016 Surgery    Left lumpectomy: IDC grade 2, 2.6 cm, DCIS intermediate grade,0/3 lymph nodes negative, ER 60%, PR 0%, Ki-67 30%, HER-2 positive ratio 6.04, T2 N0 stage II a      09/01/2016 -  Chemotherapy    Abraxane Herceptin weekly 12 followed by Herceptin maintenance every 3 weeks        CHIEF COMPLIANT: Cycle 2 Abraxane Herceptin  INTERVAL HISTORY: [name] is a 51 year old with above-mentioned history of left breast cancer currently on adjuvant chemotherapy with Abraxane and  Herceptin.  Patient had an reaction to Hercetin treatmen with some shaking chlls and was trated ith demerol with immediate improvement.Today is cycle 2 of treatment. Overall she is tolerating the chemotherapy fairly well. He did not have any nausea vomiting. Did not have any neuropathy. She did have some fatigue after the chemotherapy but she has recovered from it. Denies any fevers or chills.  REVIEW OF SYSTEMS:   Constitutional: Denies fevers, chills or abnormal weight loss Eyes: Denies blurriness of vision Ears, nose, mouth, throat, and face: Denies mucositis or sore throat Respiratory: Denies cough, dyspnea or wheezes Cardiovascular: Denies palpitation, chest discomfort Gastrointestinal:  Denies nausea, heartburn or change in bowel habits Skin: Denies abnormal skin rashes Lymphatics: Denies new lymphadenopathy or easy bruising Neurological:Denies numbness, tingling or new weaknesses Behavioral/Psych: Mood is stable, no new changes  Extremities: No lower extremity edema Breast:  denies any pain or lumps or nodules in either breasts All other systems were reviewed with the patient and are negative.  I have reviewed the past medical history, past surgical history, social history and family history with the patient and they are unchanged from previous note.  ALLERGIES:  is allergic to amoxicillin; ampicillin; and sulfa antibiotics.  MEDICATIONS:  Current Outpatient Prescriptions  Medication Sig Dispense Refill  . acetaminophen (TYLENOL) 500 MG tablet Take 1,000 mg by mouth every 6 (six) hours as needed for moderate pain or headache.     . albuterol (PROVENTIL HFA;VENTOLIN HFA) 108 (90 Base) MCG/ACT inhaler Inhale 2 puffs into the lungs every 6 (six) hours as needed for wheezing or shortness of breath. 1 Inhaler 2  . anastrozole (  ARIMIDEX) 1 MG tablet Take 1 mg by mouth daily.  1  . Azelastine-Fluticasone 137-50 MCG/ACT SUSP Place 1 spray into the nose 2 (two) times daily. (Patient not  taking: Reported on 07/14/2016) 23 g 5  . cetirizine (ZYRTEC) 10 MG tablet Take 1 tablet (10 mg total) by mouth at bedtime. (Patient taking differently: Take 10 mg by mouth daily. ) 30 tablet 5  . chlorpheniramine-HYDROcodone (TUSSIONEX PENNKINETIC ER) 10-8 MG/5ML SUER Take 5 mLs by mouth every 12 (twelve) hours as needed for cough. 150 mL 0  . fluticasone (FLONASE) 50 MCG/ACT nasal spray Place 2 sprays into both nostrils daily. (Patient taking differently: Place 2 sprays into both nostrils daily as needed for allergies. ) 16 g 5  . Fluticasone-Salmeterol (ADVAIR DISKUS) 250-50 MCG/DOSE AEPB Inhale 1 puff into the lungs 2 (two) times daily. 60 each 5  . glucose blood (BAYER CONTOUR TEST) test strip TEST twice a day    . insulin glargine (LANTUS) 100 UNIT/ML injection Inject 0.25 mLs (25 Units total) into the skin at bedtime. (Patient not taking: Reported on 07/14/2016) 10 mL 0  . lidocaine-prilocaine (EMLA) cream Apply to affected area once 30 g 3  . losartan (COZAAR) 100 MG tablet Take 1 tablet (100 mg total) by mouth daily. (Patient taking differently: Take 50 mg by mouth daily. ) 30 tablet 0  . metFORMIN (GLUCOPHAGE) 500 MG tablet Take 1 tablet (500 mg total) by mouth 2 (two) times daily with a meal. 60 tablet 2  . metoprolol tartrate (LOPRESSOR) 25 MG tablet Take 1 tablet (25 mg total) by mouth 2 (two) times daily. 60 tablet 2  . OPSUMIT 10 MG TABS Take 10 mg by mouth daily.  8  . oxyCODONE (OXY IR/ROXICODONE) 5 MG immediate release tablet Take 1-2 tablets (5-10 mg total) by mouth every 4 (four) hours as needed for moderate pain. 30 tablet 0  . Polyethyl Glycol-Propyl Glycol (SYSTANE OP) Apply 1 drop to eye daily as needed (dry eyes).    . promethazine-codeine (PHENERGAN WITH CODEINE) 6.25-10 MG/5ML syrup Take 5 mLs by mouth every 4 (four) hours as needed for cough.   0  . sildenafil (REVATIO) 20 MG tablet Take 20 mg by mouth 3 (three) times daily.    Marland Kitchen spironolactone (ALDACTONE) 50 MG tablet Take  50 mg by mouth daily.     Marland Kitchen torsemide (DEMADEX) 20 MG tablet Take 20 mg by mouth 2 (two) times daily.    Marland Kitchen UPTRAVI 200 MCG TABS Take 200 mcg by mouth 2 (two) times daily.  10   No current facility-administered medications for this visit.    Facility-Administered Medications Ordered in Other Visits  Medication Dose Route Frequency Provider Last Rate Last Dose  . sodium chloride flush (NS) 0.9 % injection 10 mL  10 mL Intracatheter PRN Nicholas Lose, MD   10 mL at 09/08/16 1518    PHYSICAL EXAMINATION: ECOG PERFORMANCE STATUS: 1 - Symptomatic but completely ambulatory  Vitals:   09/08/16 1018  BP: (!) 114/57  Pulse: 79  Resp: 19  Temp: 98.1 F (36.7 C)   Filed Weights   09/08/16 1018  Weight: 234 lb 1.6 oz (106.2 kg)    GENERAL:alert, no distress and comfortable SKIN: skin color, texture, turgor are normal, no rashes or significant lesions EYES: normal, Conjunctiva are pink and non-injected, sclera clear OROPHARYNX:no exudate, no erythema and lips, buccal mucosa, and tongue normal  NECK: supple, thyroid normal size, non-tender, without nodularity LYMPH:  no palpable lymphadenopathy in the  cervical, axillary or inguinal LUNGS: clear to auscultation and percussion with normal breathing effort HEART: regular rate & rhythm and no murmurs and no lower extremity edema ABDOMEN:abdomen soft, non-tender and normal bowel sounds MUSCULOSKELETAL:no cyanosis of digits and no clubbing  NEURO: alert & oriented x 3 with fluent speech, no focal motor/sensory deficits EXTREMITIES: No lower extremity edema  LABORATORY DATA:  I have reviewed the data as listed   Chemistry      Component Value Date/Time   NA 142 09/08/2016 0941   K 4.1 09/08/2016 0941   CL 103 07/28/2016 0335   CO2 27 09/08/2016 0941   BUN 24.0 09/08/2016 0941   CREATININE 1.1 09/08/2016 0941      Component Value Date/Time   CALCIUM 9.5 09/08/2016 0941   ALKPHOS 82 09/08/2016 0941   AST 18 09/08/2016 0941   ALT 19  09/08/2016 0941   BILITOT 0.53 09/08/2016 0941       Lab Results  Component Value Date   WBC 4.9 09/08/2016   HGB 12.7 09/08/2016   HCT 36.8 09/08/2016   MCV 96.8 09/08/2016   PLT 160 09/08/2016   NEUTROABS 3.5 09/08/2016    ASSESSMENT & PLAN:  Breast cancer of upper-inner quadrant of left female breast ([place]) Left lumpectomy 07/28/2016: IDC grade 2, 2.6 cm, DCIS intermediate grade,0/3 lymph nodes negative, ER 60%, PR 0%, Ki-67 30%, HER-2 positive ratio 6.04, T2 N0 stage II a  Pathology counseling: I discussed the final pathology report of the patient provided a copy of this report. I discussed the margins as well as lymph node surgeries. We also discussed the final staging along with previously performed ER/PR and HER-2/neu testing.  Recommendation: 1. Adjuvant chemotherapy with Abraxane and Herceptin (cannot take Taxol because she could not receive steroids)weekly 12 followed by every 3 week Herceptin for one year 2. followed by adjuvant radiation 3. Followed by adjuvant antiestrogen therapy ------------------------------------------------------------------------------------------------------------------------------- Current treatment: Cycle 2 day 1 Abraxane Herceptin Labs have been reviewed  Chemo Toxicities: Denies any nausea vomiting Complains of fatigue  reactons to Herceptin : will start on the infusion and add Pepcid Closely monitoring for chemotherapy toxicities Return to clinic in 2 weeks for cycle 4.  I spent 25 minutes talking to the patient of which more than half was spent in counseling and coordination of care.  No orders of the defined types were placed in this encounter.  The patient has a good understanding of the overall plan. she agrees with it. she will call with any problems that may develop before the next visit here.   Rulon Eisenmenger, MD 09/08/16

## 2016-09-08 NOTE — Patient Instructions (Signed)

## 2016-09-08 NOTE — Patient Instructions (Signed)
[facility] Discharge Instructions for Patients Receiving Chemotherapy  Today you received the following chemotherapy agents Herceptin and Abraxine  To help prevent nausea and vomiting after your treatment, we encourage you to take your nausea medication as directed   If you develop nausea and vomiting that is not controlled by your nausea medication, call the clinic.   BELOW ARE SYMPTOMS THAT SHOULD BE REPORTED IMMEDIATELY:  *FEVER GREATER THAN 100.5 F  *CHILLS WITH OR WITHOUT FEVER  NAUSEA AND VOMITING THAT IS NOT CONTROLLED WITH YOUR NAUSEA MEDICATION  *UNUSUAL SHORTNESS OF BREATH  *UNUSUAL BRUISING OR BLEEDING  TENDERNESS IN MOUTH AND THROAT WITH OR WITHOUT PRESENCE OF ULCERS  *URINARY PROBLEMS  *BOWEL PROBLEMS  UNUSUAL RASH Items with * indicate a potential emergency and should be followed up as soon as possible.  Feel free to call the clinic you have any questions or concerns. The clinic phone number is [phone].  Please show the CHEMO ALERT CARD at check-in to the Emergency Department and triage nurse.   

## 2016-09-08 NOTE — Addendum Note (Signed)
Addended by: Neysa Hotter on: 09/08/2016 05:31 PM   Modules accepted: Orders

## 2016-09-08 NOTE — Progress Notes (Signed)
At time of discharge, patient states she had mild chills at the end of Herceptin infusion lasting less than 30 minutes. She did not notify RN at that time. Resolved before discharge. Will notify pharmacy for consideration for next treatment.

## 2016-09-15 ENCOUNTER — Ambulatory Visit (HOSPITAL_BASED_OUTPATIENT_CLINIC_OR_DEPARTMENT_OTHER): Payer: BLUE CROSS/BLUE SHIELD

## 2016-09-15 ENCOUNTER — Other Ambulatory Visit (HOSPITAL_BASED_OUTPATIENT_CLINIC_OR_DEPARTMENT_OTHER): Payer: BLUE CROSS/BLUE SHIELD

## 2016-09-15 ENCOUNTER — Encounter: Payer: Self-pay | Admitting: *Deleted

## 2016-09-15 ENCOUNTER — Ambulatory Visit: Payer: BLUE CROSS/BLUE SHIELD

## 2016-09-15 VITALS — BP 92/58 | HR 71 | Temp 98.0°F | Resp 20

## 2016-09-15 DIAGNOSIS — Z170 Estrogen receptor positive status [ER+]: Principal | ICD-10-CM

## 2016-09-15 DIAGNOSIS — Z5111 Encounter for antineoplastic chemotherapy: Secondary | ICD-10-CM

## 2016-09-15 DIAGNOSIS — C50212 Malignant neoplasm of upper-inner quadrant of left female breast: Secondary | ICD-10-CM

## 2016-09-15 DIAGNOSIS — D649 Anemia, unspecified: Secondary | ICD-10-CM

## 2016-09-15 DIAGNOSIS — Z95828 Presence of other vascular implants and grafts: Secondary | ICD-10-CM

## 2016-09-15 DIAGNOSIS — Z5112 Encounter for antineoplastic immunotherapy: Secondary | ICD-10-CM

## 2016-09-15 DIAGNOSIS — D259 Leiomyoma of uterus, unspecified: Secondary | ICD-10-CM

## 2016-09-15 LAB — CBC WITH DIFFERENTIAL/PLATELET
BASO%: 0.7 % (ref 0.0–2.0)
Basophils Absolute: 0 10*3/uL (ref 0.0–0.1)
EOS%: 3.4 % (ref 0.0–7.0)
Eosinophils Absolute: 0.1 10*3/uL (ref 0.0–0.5)
HCT: 37.9 % (ref 34.8–46.6)
HGB: 13.1 g/dL (ref 11.6–15.9)
LYMPH%: 29.9 % (ref 14.0–49.7)
MCH: 33.3 pg (ref 25.1–34.0)
MCHC: 34.4 g/dL (ref 31.5–36.0)
MCV: 96.8 fL (ref 79.5–101.0)
MONO#: 0.3 10*3/uL (ref 0.1–0.9)
MONO%: 7.3 % (ref 0.0–14.0)
NEUT#: 2.3 10*3/uL (ref 1.5–6.5)
NEUT%: 58.7 % (ref 38.4–76.8)
Platelets: 209 10*3/uL (ref 145–400)
RBC: 3.92 10*6/uL (ref 3.70–5.45)
RDW: 13.2 % (ref 11.2–14.5)
WBC: 4 10*3/uL (ref 3.9–10.3)
lymph#: 1.2 10*3/uL (ref 0.9–3.3)

## 2016-09-15 LAB — COMPREHENSIVE METABOLIC PANEL
ALT: 15 U/L (ref 0–55)
ANION GAP: 12 meq/L — AB (ref 3–11)
AST: 15 U/L (ref 5–34)
Albumin: 3.8 g/dL (ref 3.5–5.0)
Alkaline Phosphatase: 88 U/L (ref 40–150)
BUN: 20.2 mg/dL (ref 7.0–26.0)
CHLORIDE: 105 meq/L (ref 98–109)
CO2: 25 meq/L (ref 22–29)
CREATININE: 1.2 mg/dL — AB (ref 0.6–1.1)
Calcium: 9.5 mg/dL (ref 8.4–10.4)
EGFR: 63 mL/min/{1.73_m2} — ABNORMAL LOW (ref >90)
Glucose: 116 mg/dl (ref 70–140)
Potassium: 4.2 mEq/L (ref 3.5–5.1)
SODIUM: 142 meq/L (ref 136–145)
Total Bilirubin: 0.57 mg/dL (ref 0.20–1.20)
Total Protein: 8 g/dL (ref 6.4–8.3)

## 2016-09-15 MED ORDER — DIPHENHYDRAMINE HCL 25 MG PO CAPS
ORAL_CAPSULE | ORAL | Status: AC
  Filled 2016-09-15: qty 2

## 2016-09-15 MED ORDER — ACETAMINOPHEN 325 MG PO TABS
650.0000 mg | ORAL_TABLET | Freq: Once | ORAL | Status: AC
  Administered 2016-09-15: 650 mg via ORAL

## 2016-09-15 MED ORDER — SODIUM CHLORIDE 0.9 % IV SOLN
Freq: Once | INTRAVENOUS | Status: AC
  Administered 2016-09-15: 11:00:00 via INTRAVENOUS

## 2016-09-15 MED ORDER — SODIUM CHLORIDE 0.9% FLUSH
10.0000 mL | Freq: Once | INTRAVENOUS | Status: AC
  Administered 2016-09-15: 10 mL
  Filled 2016-09-15: qty 10

## 2016-09-15 MED ORDER — HEPARIN SOD (PORK) LOCK FLUSH 100 UNIT/ML IV SOLN
500.0000 [IU] | Freq: Once | INTRAVENOUS | Status: AC | PRN
  Administered 2016-09-15: 500 [IU]
  Filled 2016-09-15: qty 5

## 2016-09-15 MED ORDER — PACLITAXEL PROTEIN-BOUND CHEMO INJECTION 100 MG
80.0000 mg/m2 | Freq: Once | INTRAVENOUS | Status: AC
  Administered 2016-09-15: 175 mg via INTRAVENOUS
  Filled 2016-09-15: qty 35

## 2016-09-15 MED ORDER — TRASTUZUMAB CHEMO 150 MG IV SOLR
2.0000 mg/kg | Freq: Once | INTRAVENOUS | Status: AC
  Administered 2016-09-15: 210 mg via INTRAVENOUS
  Filled 2016-09-15: qty 10

## 2016-09-15 MED ORDER — FAMOTIDINE IN NACL 20-0.9 MG/50ML-% IV SOLN
20.0000 mg | Freq: Once | INTRAVENOUS | Status: AC
  Administered 2016-09-15: 20 mg via INTRAVENOUS

## 2016-09-15 MED ORDER — PROCHLORPERAZINE MALEATE 10 MG PO TABS
ORAL_TABLET | ORAL | Status: AC
  Filled 2016-09-15: qty 1

## 2016-09-15 MED ORDER — DIPHENHYDRAMINE HCL 25 MG PO CAPS
50.0000 mg | ORAL_CAPSULE | Freq: Once | ORAL | Status: AC
  Administered 2016-09-15: 50 mg via ORAL

## 2016-09-15 MED ORDER — ACETAMINOPHEN 325 MG PO TABS
ORAL_TABLET | ORAL | Status: AC
  Filled 2016-09-15: qty 2

## 2016-09-15 MED ORDER — SODIUM CHLORIDE 0.9% FLUSH
10.0000 mL | INTRAVENOUS | Status: DC | PRN
  Administered 2016-09-15: 10 mL
  Filled 2016-09-15: qty 10

## 2016-09-15 MED ORDER — PROCHLORPERAZINE MALEATE 10 MG PO TABS
10.0000 mg | ORAL_TABLET | Freq: Once | ORAL | Status: AC
  Administered 2016-09-15: 10 mg via ORAL

## 2016-09-15 MED ORDER — FAMOTIDINE IN NACL 20-0.9 MG/50ML-% IV SOLN
INTRAVENOUS | Status: AC
  Filled 2016-09-15: qty 50

## 2016-09-15 NOTE — Patient Instructions (Signed)
[facility] Discharge Instructions for Patients Receiving Chemotherapy  Today you received the following chemotherapy agents Herceptin and Abraxine  To help prevent nausea and vomiting after your treatment, we encourage you to take your nausea medication as directed   If you develop nausea and vomiting that is not controlled by your nausea medication, call the clinic.   BELOW ARE SYMPTOMS THAT SHOULD BE REPORTED IMMEDIATELY:  *FEVER GREATER THAN 100.5 F  *CHILLS WITH OR WITHOUT FEVER  NAUSEA AND VOMITING THAT IS NOT CONTROLLED WITH YOUR NAUSEA MEDICATION  *UNUSUAL SHORTNESS OF BREATH  *UNUSUAL BRUISING OR BLEEDING  TENDERNESS IN MOUTH AND THROAT WITH OR WITHOUT PRESENCE OF ULCERS  *URINARY PROBLEMS  *BOWEL PROBLEMS  UNUSUAL RASH Items with * indicate a potential emergency and should be followed up as soon as possible.  Feel free to call the clinic you have any questions or concerns. The clinic phone number is [phone].  Please show the [place] at check-in to the Emergency Department and triage nurse.

## 2016-09-18 ENCOUNTER — Other Ambulatory Visit: Payer: Self-pay | Admitting: Nurse Practitioner

## 2016-09-18 DIAGNOSIS — R0981 Nasal congestion: Secondary | ICD-10-CM | POA: Diagnosis not present

## 2016-09-18 DIAGNOSIS — Z90710 Acquired absence of both cervix and uterus: Secondary | ICD-10-CM | POA: Diagnosis not present

## 2016-09-18 DIAGNOSIS — R05 Cough: Secondary | ICD-10-CM | POA: Diagnosis not present

## 2016-09-18 DIAGNOSIS — Z9981 Dependence on supplemental oxygen: Secondary | ICD-10-CM | POA: Diagnosis not present

## 2016-09-18 DIAGNOSIS — I509 Heart failure, unspecified: Secondary | ICD-10-CM | POA: Diagnosis not present

## 2016-09-18 DIAGNOSIS — I2721 Secondary pulmonary arterial hypertension: Secondary | ICD-10-CM | POA: Diagnosis not present

## 2016-09-18 DIAGNOSIS — Z8249 Family history of ischemic heart disease and other diseases of the circulatory system: Secondary | ICD-10-CM | POA: Diagnosis not present

## 2016-09-18 DIAGNOSIS — R0902 Hypoxemia: Secondary | ICD-10-CM | POA: Diagnosis not present

## 2016-09-18 DIAGNOSIS — E119 Type 2 diabetes mellitus without complications: Secondary | ICD-10-CM | POA: Diagnosis not present

## 2016-09-18 DIAGNOSIS — G4733 Obstructive sleep apnea (adult) (pediatric): Secondary | ICD-10-CM | POA: Diagnosis not present

## 2016-09-18 DIAGNOSIS — Z87891 Personal history of nicotine dependence: Secondary | ICD-10-CM | POA: Diagnosis not present

## 2016-09-18 DIAGNOSIS — C50912 Malignant neoplasm of unspecified site of left female breast: Secondary | ICD-10-CM | POA: Diagnosis not present

## 2016-09-18 DIAGNOSIS — R944 Abnormal results of kidney function studies: Secondary | ICD-10-CM | POA: Diagnosis not present

## 2016-09-18 DIAGNOSIS — Z6838 Body mass index (BMI) 38.0-38.9, adult: Secondary | ICD-10-CM | POA: Diagnosis not present

## 2016-09-18 DIAGNOSIS — D751 Secondary polycythemia: Secondary | ICD-10-CM | POA: Diagnosis not present

## 2016-09-18 DIAGNOSIS — Z9289 Personal history of other medical treatment: Secondary | ICD-10-CM | POA: Diagnosis not present

## 2016-09-18 DIAGNOSIS — I10 Essential (primary) hypertension: Secondary | ICD-10-CM | POA: Diagnosis not present

## 2016-09-18 DIAGNOSIS — Z880 Allergy status to penicillin: Secondary | ICD-10-CM | POA: Diagnosis not present

## 2016-09-18 DIAGNOSIS — I272 Pulmonary hypertension, unspecified: Secondary | ICD-10-CM | POA: Diagnosis not present

## 2016-09-21 ENCOUNTER — Other Ambulatory Visit: Payer: Self-pay | Admitting: Hematology and Oncology

## 2016-09-22 ENCOUNTER — Ambulatory Visit: Payer: BLUE CROSS/BLUE SHIELD

## 2016-09-22 ENCOUNTER — Other Ambulatory Visit: Payer: Self-pay

## 2016-09-22 ENCOUNTER — Other Ambulatory Visit (HOSPITAL_BASED_OUTPATIENT_CLINIC_OR_DEPARTMENT_OTHER): Payer: BLUE CROSS/BLUE SHIELD

## 2016-09-22 ENCOUNTER — Encounter: Payer: Self-pay | Admitting: Hematology and Oncology

## 2016-09-22 ENCOUNTER — Encounter: Payer: Self-pay | Admitting: *Deleted

## 2016-09-22 ENCOUNTER — Ambulatory Visit (HOSPITAL_BASED_OUTPATIENT_CLINIC_OR_DEPARTMENT_OTHER): Payer: BLUE CROSS/BLUE SHIELD

## 2016-09-22 ENCOUNTER — Ambulatory Visit (HOSPITAL_BASED_OUTPATIENT_CLINIC_OR_DEPARTMENT_OTHER): Payer: BLUE CROSS/BLUE SHIELD | Admitting: Hematology and Oncology

## 2016-09-22 DIAGNOSIS — C50212 Malignant neoplasm of upper-inner quadrant of left female breast: Secondary | ICD-10-CM

## 2016-09-22 DIAGNOSIS — D6481 Anemia due to antineoplastic chemotherapy: Secondary | ICD-10-CM | POA: Diagnosis not present

## 2016-09-22 DIAGNOSIS — Z170 Estrogen receptor positive status [ER+]: Secondary | ICD-10-CM | POA: Diagnosis not present

## 2016-09-22 DIAGNOSIS — R5383 Other fatigue: Secondary | ICD-10-CM | POA: Diagnosis not present

## 2016-09-22 DIAGNOSIS — Z5112 Encounter for antineoplastic immunotherapy: Secondary | ICD-10-CM | POA: Diagnosis not present

## 2016-09-22 DIAGNOSIS — Z5111 Encounter for antineoplastic chemotherapy: Secondary | ICD-10-CM

## 2016-09-22 DIAGNOSIS — Z95828 Presence of other vascular implants and grafts: Secondary | ICD-10-CM

## 2016-09-22 DIAGNOSIS — D649 Anemia, unspecified: Secondary | ICD-10-CM

## 2016-09-22 DIAGNOSIS — D259 Leiomyoma of uterus, unspecified: Secondary | ICD-10-CM

## 2016-09-22 LAB — CBC WITH DIFFERENTIAL/PLATELET
BASO%: 0.6 % (ref 0.0–2.0)
BASOS ABS: 0 10*3/uL (ref 0.0–0.1)
EOS%: 1.4 % (ref 0.0–7.0)
Eosinophils Absolute: 0.1 10*3/uL (ref 0.0–0.5)
HCT: 33.3 % — ABNORMAL LOW (ref 34.8–46.6)
HGB: 11.3 g/dL — ABNORMAL LOW (ref 11.6–15.9)
LYMPH%: 29.2 % (ref 14.0–49.7)
MCH: 32.7 pg (ref 25.1–34.0)
MCHC: 33.9 g/dL (ref 31.5–36.0)
MCV: 96.2 fL (ref 79.5–101.0)
MONO#: 0.3 10*3/uL (ref 0.1–0.9)
MONO%: 5.7 % (ref 0.0–14.0)
NEUT#: 3.1 10*3/uL (ref 1.5–6.5)
NEUT%: 63.1 % (ref 38.4–76.8)
Platelets: 154 10*3/uL (ref 145–400)
RBC: 3.46 10*6/uL — AB (ref 3.70–5.45)
RDW: 13.4 % (ref 11.2–14.5)
WBC: 4.9 10*3/uL (ref 3.9–10.3)
lymph#: 1.4 10*3/uL (ref 0.9–3.3)

## 2016-09-22 LAB — COMPREHENSIVE METABOLIC PANEL
ALT: 21 U/L (ref 0–55)
ANION GAP: 9 meq/L (ref 3–11)
AST: 23 U/L (ref 5–34)
Albumin: 3.7 g/dL (ref 3.5–5.0)
Alkaline Phosphatase: 89 U/L (ref 40–150)
BUN: 14.1 mg/dL (ref 7.0–26.0)
CHLORIDE: 106 meq/L (ref 98–109)
CO2: 24 meq/L (ref 22–29)
CREATININE: 0.9 mg/dL (ref 0.6–1.1)
Calcium: 9.2 mg/dL (ref 8.4–10.4)
EGFR: 85 mL/min/{1.73_m2} — ABNORMAL LOW (ref >90)
Glucose: 102 mg/dl (ref 70–140)
POTASSIUM: 4.1 meq/L (ref 3.5–5.1)
Sodium: 139 mEq/L (ref 136–145)
Total Bilirubin: 0.61 mg/dL (ref 0.20–1.20)
Total Protein: 7.4 g/dL (ref 6.4–8.3)

## 2016-09-22 LAB — TECHNOLOGIST REVIEW

## 2016-09-22 MED ORDER — DIPHENHYDRAMINE HCL 25 MG PO CAPS
50.0000 mg | ORAL_CAPSULE | Freq: Once | ORAL | Status: AC
  Administered 2016-09-22: 50 mg via ORAL

## 2016-09-22 MED ORDER — SODIUM CHLORIDE 0.9% FLUSH
10.0000 mL | Freq: Once | INTRAVENOUS | Status: AC
  Administered 2016-09-22: 10 mL
  Filled 2016-09-22: qty 10

## 2016-09-22 MED ORDER — ACETAMINOPHEN 325 MG PO TABS
650.0000 mg | ORAL_TABLET | Freq: Once | ORAL | Status: AC
Start: 2016-09-22 — End: 2016-09-22
  Administered 2016-09-22: 650 mg via ORAL

## 2016-09-22 MED ORDER — SODIUM CHLORIDE 0.9% FLUSH
10.0000 mL | INTRAVENOUS | Status: DC | PRN
  Administered 2016-09-22: 10 mL
  Filled 2016-09-22: qty 10

## 2016-09-22 MED ORDER — PACLITAXEL PROTEIN-BOUND CHEMO INJECTION 100 MG
80.0000 mg/m2 | Freq: Once | INTRAVENOUS | Status: AC
  Administered 2016-09-22: 175 mg via INTRAVENOUS
  Filled 2016-09-22: qty 35

## 2016-09-22 MED ORDER — HEPARIN SOD (PORK) LOCK FLUSH 100 UNIT/ML IV SOLN
500.0000 [IU] | Freq: Once | INTRAVENOUS | Status: AC | PRN
  Administered 2016-09-22: 500 [IU]
  Filled 2016-09-22: qty 5

## 2016-09-22 MED ORDER — PROCHLORPERAZINE MALEATE 10 MG PO TABS
10.0000 mg | ORAL_TABLET | Freq: Once | ORAL | Status: AC
  Administered 2016-09-22: 10 mg via ORAL

## 2016-09-22 MED ORDER — TRASTUZUMAB CHEMO 150 MG IV SOLR
2.0000 mg/kg | Freq: Once | INTRAVENOUS | Status: AC
  Administered 2016-09-22: 210 mg via INTRAVENOUS
  Filled 2016-09-22: qty 10

## 2016-09-22 MED ORDER — ONDANSETRON HCL 8 MG PO TABS: 8.0000 mg | ORAL_TABLET | Freq: Two times a day (BID) | ORAL | 1 refills | Status: DC | PRN

## 2016-09-22 MED ORDER — FAMOTIDINE IN NACL 20-0.9 MG/50ML-% IV SOLN
20.0000 mg | Freq: Once | INTRAVENOUS | Status: AC
  Administered 2016-09-22: 20 mg via INTRAVENOUS

## 2016-09-22 MED ORDER — FAMOTIDINE IN NACL 20-0.9 MG/50ML-% IV SOLN
INTRAVENOUS | Status: AC
  Filled 2016-09-22: qty 50

## 2016-09-22 MED ORDER — SODIUM CHLORIDE 0.9 % IV SOLN
Freq: Once | INTRAVENOUS | Status: AC
  Administered 2016-09-22: 11:00:00 via INTRAVENOUS

## 2016-09-22 MED ORDER — DIPHENHYDRAMINE HCL 25 MG PO CAPS
ORAL_CAPSULE | ORAL | Status: AC
Start: 2016-09-22 — End: 2016-09-22
  Filled 2016-09-22: qty 2

## 2016-09-22 MED ORDER — ACETAMINOPHEN 325 MG PO TABS
ORAL_TABLET | ORAL | Status: AC
  Filled 2016-09-22: qty 2

## 2016-09-22 MED ORDER — PROCHLORPERAZINE MALEATE 10 MG PO TABS
ORAL_TABLET | ORAL | Status: AC
  Filled 2016-09-22: qty 1

## 2016-09-22 NOTE — Progress Notes (Signed)
Patient Care Team: McLean-Scocuzza, Nino Glow, MD as PCP - General (Internal Medicine) Vonna Drafts, FNP as Nurse Practitioner (Nurse Practitioner) Stark Klein, MD as Consulting Physician (General Surgery) Nicholas Lose, MD as Consulting Physician (Hematology and Oncology) Gery Pray, MD as Consulting Physician (Radiation Oncology)  DIAGNOSIS:  Encounter Diagnosis  Name Primary?  . Malignant neoplasm of upper-inner quadrant of left breast in female, estrogen receptor positive ([place])     SUMMARY OF ONCOLOGIC HISTORY:   Malignant neoplasm of upper-inner quadrant of left breast in female, estrogen receptor positive ([place])   03/04/2016 Initial Diagnosis    Screening detected left breast density posterior medial 1.4 cm by ultrasound axilla negative, grade 3 IDC ER 60%, PR 0%, Ki-67 30%, HER-2 positive ratio 6.04, copy #16; T1 cN0 stage IA clinical stage      03/21/2016 Genetic Testing    NEgative genetic testing on the comprehensive cancer panel and Negative genetic testing for the MSH2 inversion analysis ([place] inversion). The Comprehensive Cancer Panel offered by [facility] includes sequencing and/or deletion duplication testing of the following 32 genes: APC, ATM, AXIN2, BARD1, BMPR1A, BRCA1, BRCA2, BRIP1, CDH1, CDK4, CDKN2A, CHEK2, EPCAM, FANCC, MLH1, MSH2, MSH6, MUTYH, NBN, PALB2, PMS2, POLD1, POLE, PTEN, RAD51C, RAD51D, SCG5/GREM1, SMAD4, STK11, TP53, VHL, and XRCC2.   The report date is March 21, 2016.      07/27/2016 Surgery    Left lumpectomy: IDC grade 2, 2.6 cm, DCIS intermediate grade,0/3 lymph nodes negative, ER 60%, PR 0%, Ki-67 30%, HER-2 positive ratio 6.04, T2 N0 stage II a      09/01/2016 -  Chemotherapy    Abraxane Herceptin weekly 12 followed by Herceptin maintenance every 3 weeks        CHIEF COMPLIANT: cycle 4 Abraxane Herceptin  INTERVAL HISTORY: [name] is a 51 year old with above-mentioned history left breast cancer treated with lumpectomy and  is currently not adjuvant chemotherapy with Abraxane Herceptin.Overall she's tolerating chemotherapy fairly well. She started to see some hair loss and is planning to trim her hair today. She has had some sinus drainage issues and she is having cough problems. She denies any fevers or chills. She denies any neuropathy at this time but at nighttime it appears that she feels that her hand falls asleep.  REVIEW OF SYSTEMS:   Constitutional: Denies fevers, chills or abnormal weight loss Eyes: Denies blurriness of vision Ears, nose, mouth, throat, and face: Denies mucositis or sore throat Respiratory: sinuses are draining and a dry cough Cardiovascular: Denies palpitation, chest discomfort Gastrointestinal:  Denies nausea, heartburn or change in bowel habits Skin: Denies abnormal skin rashes Lymphatics: Denies new lymphadenopathy or easy bruising Neurological:numbness in the handed nighttime probably because she is sleeping in an awkward position. Behavioral/Psych: Mood is stable, no new changes  Extremities: No lower extremity edema  All other systems were reviewed with the patient and are negative.  I have reviewed the past medical history, past surgical history, social history and family history with the patient and they are unchanged from previous note.  ALLERGIES:  is allergic to amoxicillin; ampicillin; and sulfa antibiotics.  MEDICATIONS:  Current Outpatient Prescriptions  Medication Sig Dispense Refill  . acetaminophen (TYLENOL) 500 MG tablet Take 1,000 mg by mouth every 6 (six) hours as needed for moderate pain or headache.     . albuterol (PROVENTIL HFA;VENTOLIN HFA) 108 (90 Base) MCG/ACT inhaler Inhale 2 puffs into the lungs every 6 (six) hours as needed for wheezing or shortness of breath. 1 Inhaler 2  .  anastrozole (ARIMIDEX) 1 MG tablet Take 1 mg by mouth daily.  1  . Azelastine-Fluticasone 137-50 MCG/ACT SUSP Place 1 spray into the nose 2 (two) times daily. (Patient not taking:  Reported on 07/14/2016) 23 g 5  . cetirizine (ZYRTEC) 10 MG tablet Take 1 tablet (10 mg total) by mouth at bedtime. (Patient taking differently: Take 10 mg by mouth daily. ) 30 tablet 5  . chlorpheniramine-HYDROcodone (TUSSIONEX PENNKINETIC ER) 10-8 MG/5ML SUER Take 5 mLs by mouth every 12 (twelve) hours as needed for cough. 150 mL 0  . fluticasone (FLONASE) 50 MCG/ACT nasal spray Place 2 sprays into both nostrils daily. (Patient taking differently: Place 2 sprays into both nostrils daily as needed for allergies. ) 16 g 5  . Fluticasone-Salmeterol (ADVAIR DISKUS) 250-50 MCG/DOSE AEPB Inhale 1 puff into the lungs 2 (two) times daily. 60 each 5  . glucose blood (BAYER CONTOUR TEST) test strip TEST twice a day    . insulin glargine (LANTUS) 100 UNIT/ML injection Inject 0.25 mLs (25 Units total) into the skin at bedtime. (Patient not taking: Reported on 07/14/2016) 10 mL 0  . lidocaine-prilocaine (EMLA) cream Apply to affected area once 30 g 3  . losartan (COZAAR) 100 MG tablet Take 1 tablet (100 mg total) by mouth daily. (Patient taking differently: Take 50 mg by mouth daily. ) 30 tablet 0  . metFORMIN (GLUCOPHAGE) 500 MG tablet Take 1 tablet (500 mg total) by mouth 2 (two) times daily with a meal. 60 tablet 2  . metoprolol tartrate (LOPRESSOR) 25 MG tablet Take 1 tablet (25 mg total) by mouth 2 (two) times daily. 60 tablet 2  . OPSUMIT 10 MG TABS Take 10 mg by mouth daily.  8  . oxyCODONE (OXY IR/ROXICODONE) 5 MG immediate release tablet Take 1-2 tablets (5-10 mg total) by mouth every 4 (four) hours as needed for moderate pain. 30 tablet 0  . Polyethyl Glycol-Propyl Glycol (SYSTANE OP) Apply 1 drop to eye daily as needed (dry eyes).    . promethazine-codeine (PHENERGAN WITH CODEINE) 6.25-10 MG/5ML syrup Take 5 mLs by mouth every 4 (four) hours as needed for cough.   0  . sildenafil (REVATIO) 20 MG tablet Take 20 mg by mouth 3 (three) times daily.    Marland Kitchen spironolactone (ALDACTONE) 50 MG tablet Take 50 mg by  mouth daily.     Marland Kitchen torsemide (DEMADEX) 20 MG tablet Take 20 mg by mouth 2 (two) times daily.    Marland Kitchen UPTRAVI 200 MCG TABS Take 200 mcg by mouth 2 (two) times daily.  10   No current facility-administered medications for this visit.     PHYSICAL EXAMINATION: ECOG PERFORMANCE STATUS: 1 - Symptomatic but completely ambulatory  Vitals:   09/22/16 0959  BP: 130/74  Pulse: 88  Resp: 17  Temp: 98.3 F (36.8 C)   Filed Weights   09/22/16 0959  Weight: 239 lb 9.6 oz (108.7 kg)    GENERAL:alert, no distress and comfortable SKIN: skin color, texture, turgor are normal, no rashes or significant lesions EYES: normal, Conjunctiva are pink and non-injected, sclera clear OROPHARYNX:no exudate, no erythema and lips, buccal mucosa, and tongue normal  NECK: supple, thyroid normal size, non-tender, without nodularity LYMPH:  no palpable lymphadenopathy in the cervical, axillary or inguinal LUNGS: clear to auscultation and percussion with normal breathing effort HEART: regular rate & rhythm and no murmurs and no lower extremity edema ABDOMEN:abdomen soft, non-tender and normal bowel sounds MUSCULOSKELETAL:no cyanosis of digits and no clubbing  NEURO: alert &  oriented x 3 with fluent speech, no focal motor/sensory deficits EXTREMITIES: No lower extremity edema  LABORATORY DATA:  I have reviewed the data as listed   Chemistry      Component Value Date/Time   NA 142 09/15/2016 0931   K 4.2 09/15/2016 0931   CL 103 07/28/2016 0335   CO2 25 09/15/2016 0931   BUN 20.2 09/15/2016 0931   CREATININE 1.2 (H) 09/15/2016 0931      Component Value Date/Time   CALCIUM 9.5 09/15/2016 0931   ALKPHOS 88 09/15/2016 0931   AST 15 09/15/2016 0931   ALT 15 09/15/2016 0931   BILITOT 0.57 09/15/2016 0931       Lab Results  Component Value Date   WBC 4.9 09/22/2016   HGB 11.3 (L) 09/22/2016   HCT 33.3 (L) 09/22/2016   MCV 96.2 09/22/2016   PLT 154 09/22/2016   NEUTROABS 3.1 09/22/2016     ASSESSMENT & PLAN:  Malignant neoplasm of upper-inner quadrant of left breast in female, estrogen receptor positive ([place]) Left lumpectomy 07/28/2016: IDC grade 2, 2.6 cm, DCIS intermediate grade,0/3 lymph nodes negative, ER 60%, PR 0%, Ki-67 30%, HER-2 positive ratio 6.04, T2 N0 stage II a  Pathology counseling: I discussed the final pathology report of the patient provided a copy of this report. I discussed the margins as well as lymph node surgeries. We also discussed the final staging along with previously performed ER/PR and HER-2/neu testing.  Recommendation: 1. Adjuvant chemotherapy with Abraxane and Herceptin (cannot take Taxol because she could not receive steroids)weekly 12 followed by every 3 week Herceptin for one year 2. followed by adjuvant radiation 3. Followed by adjuvant antiestrogen therapy ------------------------------------------------------------------------------------------------------------------------------- Current treatment: Cycle 4 day 1 Abraxane Herceptin Labs have been reviewed  Chemo Toxicities: Denies any nausea vomiting 1. Fatigue 2. Reacton to Herceptin : Slowed the infusion and added Pepcid 3. Alopecia 4. Anemia grade 1  Closely monitoring for chemotherapy toxicities Return to clinic in 2 weeks for cycle 6.  I spent 25 minutes talking to the patient of which more than half was spent in counseling and coordination of care.  No orders of the defined types were placed in this encounter.  The patient has a good understanding of the overall plan. she agrees with it. she will call with any problems that may develop before the next visit here.   Rulon Eisenmenger, MD 09/22/16

## 2016-09-22 NOTE — Patient Instructions (Signed)
[facility] Discharge Instructions for Patients Receiving Chemotherapy  Today you received the following chemotherapy agents: Herceptin and Abraxane   To help prevent nausea and vomiting after your treatment, we encourage you to take your nausea medication as directed.    If you develop nausea and vomiting that is not controlled by your nausea medication, call the clinic.   BELOW ARE SYMPTOMS THAT SHOULD BE REPORTED IMMEDIATELY:  *FEVER GREATER THAN 100.5 F  *CHILLS WITH OR WITHOUT FEVER  NAUSEA AND VOMITING THAT IS NOT CONTROLLED WITH YOUR NAUSEA MEDICATION  *UNUSUAL SHORTNESS OF BREATH  *UNUSUAL BRUISING OR BLEEDING  TENDERNESS IN MOUTH AND THROAT WITH OR WITHOUT PRESENCE OF ULCERS  *URINARY PROBLEMS  *BOWEL PROBLEMS  UNUSUAL RASH Items with * indicate a potential emergency and should be followed up as soon as possible.  Feel free to call the clinic you have any questions or concerns. The clinic phone number is [phone].  Please show the [facility] at check-in to the Emergency Department and triage nurse.

## 2016-09-22 NOTE — Progress Notes (Signed)
Pt tolerated infusion well. Pt stable at discharge.

## 2016-09-22 NOTE — Assessment & Plan Note (Signed)
Left lumpectomy 07/28/2016: IDC grade 2, 2.6 cm, DCIS intermediate grade,0/3 lymph nodes negative, ER 60%, PR 0%, Ki-67 30%, HER-2 positive ratio 6.04, T2 N0 stage II a  Pathology counseling: I discussed the final pathology report of the patient provided a copy of this report. I discussed the margins as well as lymph node surgeries. We also discussed the final staging along with previously performed ER/PR and HER-2/neu testing.  Recommendation: 1. Adjuvant chemotherapy with Abraxane and Herceptin (cannot take Taxol because she could not receive steroids)weekly 12 followed by every 3 week Herceptin for one year 2. followed by adjuvant radiation 3. Followed by adjuvant antiestrogen therapy ------------------------------------------------------------------------------------------------------------------------------- Current treatment: Cycle 4 day 1 Abraxane Herceptin Labs have been reviewed  Chemo Toxicities: Denies any nausea vomiting 1. Fatigue 2. Reacton to Herceptin : Slowed the infusion and added Pepcid Closely monitoring for chemotherapy toxicities Return to clinic in 2 weeks for cycle 6.

## 2016-09-26 DIAGNOSIS — G4733 Obstructive sleep apnea (adult) (pediatric): Secondary | ICD-10-CM | POA: Diagnosis not present

## 2016-09-29 ENCOUNTER — Ambulatory Visit: Payer: BLUE CROSS/BLUE SHIELD

## 2016-09-29 ENCOUNTER — Ambulatory Visit (HOSPITAL_BASED_OUTPATIENT_CLINIC_OR_DEPARTMENT_OTHER): Payer: BLUE CROSS/BLUE SHIELD

## 2016-09-29 ENCOUNTER — Other Ambulatory Visit: Payer: Self-pay | Admitting: Hematology and Oncology

## 2016-09-29 ENCOUNTER — Other Ambulatory Visit (HOSPITAL_BASED_OUTPATIENT_CLINIC_OR_DEPARTMENT_OTHER): Payer: BLUE CROSS/BLUE SHIELD

## 2016-09-29 VITALS — BP 143/91 | HR 84 | Temp 97.4°F | Resp 18

## 2016-09-29 DIAGNOSIS — Z5112 Encounter for antineoplastic immunotherapy: Secondary | ICD-10-CM | POA: Diagnosis not present

## 2016-09-29 DIAGNOSIS — Z95828 Presence of other vascular implants and grafts: Secondary | ICD-10-CM

## 2016-09-29 DIAGNOSIS — Z5111 Encounter for antineoplastic chemotherapy: Secondary | ICD-10-CM | POA: Diagnosis not present

## 2016-09-29 DIAGNOSIS — Z170 Estrogen receptor positive status [ER+]: Principal | ICD-10-CM

## 2016-09-29 DIAGNOSIS — C50212 Malignant neoplasm of upper-inner quadrant of left female breast: Secondary | ICD-10-CM

## 2016-09-29 LAB — COMPREHENSIVE METABOLIC PANEL
ALK PHOS: 82 U/L (ref 40–150)
ALT: 18 U/L (ref 0–55)
ANION GAP: 10 meq/L (ref 3–11)
AST: 19 U/L (ref 5–34)
Albumin: 3.7 g/dL (ref 3.5–5.0)
BUN: 11.1 mg/dL (ref 7.0–26.0)
CO2: 26 meq/L (ref 22–29)
Calcium: 9.2 mg/dL (ref 8.4–10.4)
Chloride: 109 mEq/L (ref 98–109)
Creatinine: 0.9 mg/dL (ref 0.6–1.1)
EGFR: 88 mL/min/{1.73_m2} — AB (ref >90)
GLUCOSE: 127 mg/dL (ref 70–140)
POTASSIUM: 4 meq/L (ref 3.5–5.1)
SODIUM: 144 meq/L (ref 136–145)
Total Bilirubin: 0.64 mg/dL (ref 0.20–1.20)
Total Protein: 7.4 g/dL (ref 6.4–8.3)

## 2016-09-29 LAB — CBC WITH DIFFERENTIAL/PLATELET
BASO%: 0.8 % (ref 0.0–2.0)
BASOS ABS: 0 10*3/uL (ref 0.0–0.1)
EOS ABS: 0.1 10*3/uL (ref 0.0–0.5)
EOS%: 1.2 % (ref 0.0–7.0)
HCT: 32.9 % — ABNORMAL LOW (ref 34.8–46.6)
HGB: 11.3 g/dL — ABNORMAL LOW (ref 11.6–15.9)
LYMPH%: 26.1 % (ref 14.0–49.7)
MCH: 33.6 pg (ref 25.1–34.0)
MCHC: 34.4 g/dL (ref 31.5–36.0)
MCV: 97.7 fL (ref 79.5–101.0)
MONO#: 0.3 10*3/uL (ref 0.1–0.9)
MONO%: 5.9 % (ref 0.0–14.0)
NEUT#: 3.6 10*3/uL (ref 1.5–6.5)
NEUT%: 66 % (ref 38.4–76.8)
PLATELETS: 205 10*3/uL (ref 145–400)
RBC: 3.36 10*6/uL — AB (ref 3.70–5.45)
RDW: 13.9 % (ref 11.2–14.5)
WBC: 5.4 10*3/uL (ref 3.9–10.3)
lymph#: 1.4 10*3/uL (ref 0.9–3.3)

## 2016-09-29 LAB — TECHNOLOGIST REVIEW: Technologist Review: 3

## 2016-09-29 MED ORDER — PROCHLORPERAZINE MALEATE 10 MG PO TABS
10.0000 mg | ORAL_TABLET | Freq: Once | ORAL | Status: AC
  Administered 2016-09-29: 10 mg via ORAL

## 2016-09-29 MED ORDER — DIPHENHYDRAMINE HCL 25 MG PO CAPS
ORAL_CAPSULE | ORAL | Status: AC
  Filled 2016-09-29: qty 2

## 2016-09-29 MED ORDER — PROCHLORPERAZINE MALEATE 10 MG PO TABS
ORAL_TABLET | ORAL | Status: AC
  Filled 2016-09-29: qty 1

## 2016-09-29 MED ORDER — TRASTUZUMAB CHEMO 150 MG IV SOLR
2.0000 mg/kg | Freq: Once | INTRAVENOUS | Status: AC
  Administered 2016-09-29: 210 mg via INTRAVENOUS
  Filled 2016-09-29: qty 10

## 2016-09-29 MED ORDER — TRASTUZUMAB CHEMO 150 MG IV SOLR
2.0000 mg/kg | Freq: Once | INTRAVENOUS | Status: DC
  Administered 2016-09-29: 210 mg via INTRAVENOUS
  Filled 2016-09-29: qty 10

## 2016-09-29 MED ORDER — DIPHENHYDRAMINE HCL 25 MG PO CAPS
50.0000 mg | ORAL_CAPSULE | Freq: Once | ORAL | Status: AC
  Administered 2016-09-29: 50 mg via ORAL

## 2016-09-29 MED ORDER — FAMOTIDINE IN NACL 20-0.9 MG/50ML-% IV SOLN
20.0000 mg | Freq: Once | INTRAVENOUS | Status: AC
  Administered 2016-09-29: 20 mg via INTRAVENOUS

## 2016-09-29 MED ORDER — ACETAMINOPHEN 325 MG PO TABS
650.0000 mg | ORAL_TABLET | Freq: Once | ORAL | Status: AC
  Administered 2016-09-29: 650 mg via ORAL

## 2016-09-29 MED ORDER — PACLITAXEL PROTEIN-BOUND CHEMO INJECTION 100 MG
65.0000 mg/m2 | Freq: Once | INTRAVENOUS | Status: AC
  Administered 2016-09-29: 150 mg via INTRAVENOUS
  Filled 2016-09-29: qty 30

## 2016-09-29 MED ORDER — ACETAMINOPHEN 325 MG PO TABS
ORAL_TABLET | ORAL | Status: AC
  Filled 2016-09-29: qty 2

## 2016-09-29 MED ORDER — HEPARIN SOD (PORK) LOCK FLUSH 100 UNIT/ML IV SOLN
500.0000 [IU] | Freq: Once | INTRAVENOUS | Status: AC | PRN
  Administered 2016-09-29: 500 [IU]
  Filled 2016-09-29: qty 5

## 2016-09-29 MED ORDER — SODIUM CHLORIDE 0.9% FLUSH
10.0000 mL | Freq: Once | INTRAVENOUS | Status: AC
  Administered 2016-09-29: 10 mL
  Filled 2016-09-29: qty 10

## 2016-09-29 MED ORDER — SODIUM CHLORIDE 0.9 % IV SOLN
Freq: Once | INTRAVENOUS | Status: AC
  Administered 2016-09-29: 12:00:00 via INTRAVENOUS

## 2016-09-29 MED ORDER — FAMOTIDINE IN NACL 20-0.9 MG/50ML-% IV SOLN
INTRAVENOUS | Status: AC
  Filled 2016-09-29: qty 50

## 2016-09-29 MED ORDER — SODIUM CHLORIDE 0.9% FLUSH
10.0000 mL | INTRAVENOUS | Status: DC | PRN
  Administered 2016-09-29: 10 mL
  Filled 2016-09-29: qty 10

## 2016-09-29 NOTE — Progress Notes (Signed)
Pt stated that she is starting to experience constant neuropathy, "numbness" in bilateral fingers. Pt states that she can still do buttons, zippers, and ADL's. Notified May desk RN for Dr Lindi Adie.

## 2016-09-29 NOTE — Patient Instructions (Signed)
[employer], Adult A central line is a thin, flexible tube (catheter) that is put in your vein. It can be used to:  Take blood for lab tests.  Give you medicine.  Give you food and nutrients.  The procedure may vary among doctors and hospitals. Follow these instructions at home: Caring for the tube  Follow instructions from your doctor about: ? Flushing the tube with saline solution. ? Cleaning the tube and the area around it.  Only flush with clean (sterile) supplies. The supplies should be from your doctor, a pharmacy, or another place that your doctor recommends.  Before you flush the tube or clean the area around the tube: ? Wash your hands with soap and water. If you cannot use soap and water, use hand sanitizer. ? Clean the central line hub with rubbing alcohol. Caring for your skin  Keep the area where the tube was put in clean and dry.  Every day, and when changing the bandage, check the skin around the central line for: ? Redness, swelling, or pain. ? Fluid or blood. ? Warmth. ? Pus. ? A bad smell. General instructions  Keep the tube clamped, unless it is being used.  Keep your supplies in a clean, dry location.  If you or someone else accidentally pulls on the tube, make sure: ? The bandage (dressing) is okay. ? There is no bleeding. ? The tube has not been pulled out.  Do not use scissors or sharp objects near the tube.  Do not swim or let the tube soak in a tub.  Ask your doctor what activities are safe for you. Your doctor may tell you not to lift anything or move your arm too much.  Take over-the-counter and prescription medicines only as told by your doctor.  Change bandages as told by your doctor.  Keep your bandage dry. If a bandage gets wet, have it changed right away.  Keep all follow-up visits as told by your doctor. This is important. Throwing away supplies  Throw away any syringes in a trash (disposal) container that is only for sharp  items (sharps container). You can buy a sharps container from a pharmacy, or you can make one by using an empty hard plastic bottle with a cover.  Place any used bandages or infusion bags into a plastic bag. Throw that bag in the trash. Contact a doctor if:  You have any of these where the tube was put in: ? Redness, swelling, or pain. ? Fluid or blood. ? A warm feeling. ? Pus or a bad smell. Get help right away if:  You have: ? A fever. ? Chills. ? Trouble getting enough air (shortness of breath). ? Trouble breathing. ? Pain in your chest. ? Swelling in your neck, face, chest, or arm.  You are coughing.  You feel your heart beating fast or skipping beats.  You feel dizzy or you pass out (faint).  There are red lines coming from where the tube was put in.  The area where the tube was put in is bleeding and the bleeding will not stop.  Your tube is hard to flush.  You do not get a blood return from the tube.  The tube gets loose or comes out.  The tube has a hole or a tear.  The tube leaks. Summary  A central line is a thin, flexible tube (catheter) that is put in your vein. It can be used to take blood for lab tests or to  give you medicine.  Follow instructions from your doctor about flushing and cleaning the tube.  Keep the area where the tube was put in clean and dry.  Ask your doctor what activities are safe for you. This information is not intended to replace advice given to you by your health care provider. Make sure you discuss any questions you have with your health care provider. Document Released: 03/02/2012 Document Revised: 04/02/2016 Document Reviewed: 04/02/2016 Elsevier Interactive Patient Education  2017 [employer].

## 2016-10-02 ENCOUNTER — Telehealth: Payer: Self-pay | Admitting: Hematology and Oncology

## 2016-10-02 NOTE — Telephone Encounter (Signed)
Spoke to patient today in regards to FMLA/Disability paperwork that [employer] they haven't received. [employer] and spoke to rep who states that they have received Office Notes for appt on 09/22/16. Asked what other information they needed and everything they were requesting was already in the 09/22/16 Office Notes that they confirmed they already received. Gave them my direct phone number and fax in case they need additional info outside what I already sent.

## 2016-10-06 ENCOUNTER — Ambulatory Visit (HOSPITAL_BASED_OUTPATIENT_CLINIC_OR_DEPARTMENT_OTHER): Payer: BLUE CROSS/BLUE SHIELD

## 2016-10-06 ENCOUNTER — Encounter: Payer: Self-pay | Admitting: Hematology and Oncology

## 2016-10-06 ENCOUNTER — Ambulatory Visit (HOSPITAL_BASED_OUTPATIENT_CLINIC_OR_DEPARTMENT_OTHER): Payer: BLUE CROSS/BLUE SHIELD | Admitting: Hematology and Oncology

## 2016-10-06 ENCOUNTER — Other Ambulatory Visit (HOSPITAL_BASED_OUTPATIENT_CLINIC_OR_DEPARTMENT_OTHER): Payer: BLUE CROSS/BLUE SHIELD

## 2016-10-06 ENCOUNTER — Ambulatory Visit: Payer: BLUE CROSS/BLUE SHIELD

## 2016-10-06 VITALS — BP 107/71 | HR 73 | Temp 98.3°F | Resp 18

## 2016-10-06 DIAGNOSIS — D259 Leiomyoma of uterus, unspecified: Secondary | ICD-10-CM

## 2016-10-06 DIAGNOSIS — D649 Anemia, unspecified: Secondary | ICD-10-CM

## 2016-10-06 DIAGNOSIS — C50212 Malignant neoplasm of upper-inner quadrant of left female breast: Secondary | ICD-10-CM

## 2016-10-06 DIAGNOSIS — Z95828 Presence of other vascular implants and grafts: Secondary | ICD-10-CM

## 2016-10-06 DIAGNOSIS — Z170 Estrogen receptor positive status [ER+]: Principal | ICD-10-CM

## 2016-10-06 DIAGNOSIS — Z5112 Encounter for antineoplastic immunotherapy: Secondary | ICD-10-CM | POA: Diagnosis not present

## 2016-10-06 DIAGNOSIS — D6481 Anemia due to antineoplastic chemotherapy: Secondary | ICD-10-CM

## 2016-10-06 DIAGNOSIS — Z5111 Encounter for antineoplastic chemotherapy: Secondary | ICD-10-CM

## 2016-10-06 LAB — CBC WITH DIFFERENTIAL/PLATELET
BASO%: 0.9 % (ref 0.0–2.0)
Basophils Absolute: 0.1 10*3/uL (ref 0.0–0.1)
EOS%: 1.4 % (ref 0.0–7.0)
Eosinophils Absolute: 0.1 10*3/uL (ref 0.0–0.5)
HEMATOCRIT: 33.8 % — AB (ref 34.8–46.6)
HGB: 11.4 g/dL — ABNORMAL LOW (ref 11.6–15.9)
LYMPH#: 1.6 10*3/uL (ref 0.9–3.3)
LYMPH%: 26.8 % (ref 14.0–49.7)
MCH: 33.1 pg (ref 25.1–34.0)
MCHC: 33.7 g/dL (ref 31.5–36.0)
MCV: 98.3 fL (ref 79.5–101.0)
MONO#: 0.4 10*3/uL (ref 0.1–0.9)
MONO%: 7.6 % (ref 0.0–14.0)
NEUT#: 3.7 10*3/uL (ref 1.5–6.5)
NEUT%: 63.3 % (ref 38.4–76.8)
Platelets: 174 10*3/uL (ref 145–400)
RBC: 3.44 10*6/uL — ABNORMAL LOW (ref 3.70–5.45)
RDW: 15.6 % — AB (ref 11.2–14.5)
WBC: 5.8 10*3/uL (ref 3.9–10.3)

## 2016-10-06 LAB — COMPREHENSIVE METABOLIC PANEL
ALT: 16 U/L (ref 0–55)
AST: 19 U/L (ref 5–34)
Albumin: 3.8 g/dL (ref 3.5–5.0)
Alkaline Phosphatase: 85 U/L (ref 40–150)
Anion Gap: 10 mEq/L (ref 3–11)
BUN: 12.6 mg/dL (ref 7.0–26.0)
CALCIUM: 9 mg/dL (ref 8.4–10.4)
CHLORIDE: 106 meq/L (ref 98–109)
CO2: 25 meq/L (ref 22–29)
CREATININE: 0.9 mg/dL (ref 0.6–1.1)
EGFR: 84 mL/min/{1.73_m2} — ABNORMAL LOW (ref >90)
GLUCOSE: 139 mg/dL (ref 70–140)
Potassium: 4 mEq/L (ref 3.5–5.1)
SODIUM: 141 meq/L (ref 136–145)
Total Bilirubin: 0.79 mg/dL (ref 0.20–1.20)
Total Protein: 7.5 g/dL (ref 6.4–8.3)

## 2016-10-06 LAB — TECHNOLOGIST REVIEW

## 2016-10-06 MED ORDER — FAMOTIDINE IN NACL 20-0.9 MG/50ML-% IV SOLN
INTRAVENOUS | Status: AC
  Filled 2016-10-06: qty 50

## 2016-10-06 MED ORDER — DIPHENHYDRAMINE HCL 25 MG PO CAPS
50.0000 mg | ORAL_CAPSULE | Freq: Once | ORAL | Status: AC
  Administered 2016-10-06: 50 mg via ORAL

## 2016-10-06 MED ORDER — SODIUM CHLORIDE 0.9% FLUSH
10.0000 mL | Freq: Once | INTRAVENOUS | Status: AC
  Administered 2016-10-06: 10 mL
  Filled 2016-10-06: qty 10

## 2016-10-06 MED ORDER — SODIUM CHLORIDE 0.9 % IV SOLN
Freq: Once | INTRAVENOUS | Status: AC
  Administered 2016-10-06: 12:00:00 via INTRAVENOUS

## 2016-10-06 MED ORDER — ACETAMINOPHEN 325 MG PO TABS
ORAL_TABLET | ORAL | Status: AC
  Filled 2016-10-06: qty 2

## 2016-10-06 MED ORDER — TRASTUZUMAB CHEMO 150 MG IV SOLR
2.0000 mg/kg | Freq: Once | INTRAVENOUS | Status: AC
  Administered 2016-10-06: 210 mg via INTRAVENOUS
  Filled 2016-10-06: qty 10

## 2016-10-06 MED ORDER — PROCHLORPERAZINE MALEATE 10 MG PO TABS
10.0000 mg | ORAL_TABLET | Freq: Once | ORAL | Status: AC
  Administered 2016-10-06: 10 mg via ORAL

## 2016-10-06 MED ORDER — ACETAMINOPHEN 325 MG PO TABS
650.0000 mg | ORAL_TABLET | Freq: Once | ORAL | Status: AC
  Administered 2016-10-06: 650 mg via ORAL

## 2016-10-06 MED ORDER — SODIUM CHLORIDE 0.9% FLUSH
10.0000 mL | INTRAVENOUS | Status: DC | PRN
  Administered 2016-10-06: 10 mL
  Filled 2016-10-06: qty 10

## 2016-10-06 MED ORDER — PROCHLORPERAZINE MALEATE 10 MG PO TABS
ORAL_TABLET | ORAL | Status: AC
  Filled 2016-10-06: qty 1

## 2016-10-06 MED ORDER — FAMOTIDINE IN NACL 20-0.9 MG/50ML-% IV SOLN
20.0000 mg | Freq: Once | INTRAVENOUS | Status: AC
  Administered 2016-10-06: 20 mg via INTRAVENOUS

## 2016-10-06 MED ORDER — HEPARIN SOD (PORK) LOCK FLUSH 100 UNIT/ML IV SOLN
500.0000 [IU] | Freq: Once | INTRAVENOUS | Status: AC | PRN
  Administered 2016-10-06: 500 [IU]
  Filled 2016-10-06: qty 5

## 2016-10-06 MED ORDER — PACLITAXEL PROTEIN-BOUND CHEMO INJECTION 100 MG
65.0000 mg/m2 | Freq: Once | INTRAVENOUS | Status: AC
  Administered 2016-10-06: 150 mg via INTRAVENOUS
  Filled 2016-10-06: qty 30

## 2016-10-06 MED ORDER — DIPHENHYDRAMINE HCL 25 MG PO CAPS
ORAL_CAPSULE | ORAL | Status: AC
  Filled 2016-10-06: qty 2

## 2016-10-06 NOTE — Assessment & Plan Note (Signed)
Left lumpectomy 07/28/2016: IDC grade 2, 2.6 cm, DCIS intermediate grade,0/3 lymph nodes negative, ER 60%, PR 0%, Ki-67 30%, HER-2 positive ratio 6.04, T2 N0 stage II a  Recommendation: 1. Adjuvant chemotherapy with Abraxane and Herceptin (cannot take Taxol because she could not receive steroids)weekly 12 followed by every 3 week Herceptin for one year 2. followed by adjuvant radiation 3. Followed by adjuvant antiestrogen therapy ------------------------------------------------------------------------------------------------------------------------------- Current treatment: Cycle 6day 1 Abraxane Herceptin Labs have been reviewed  Chemo Toxicities: Denies any nausea vomiting 1. Fatigue 2. Reacton to Herceptin : Slowed the infusion and added Pepcid 3. Alopecia 4. Anemia grade 1  Closely monitoring for chemotherapy toxicities Return to clinic in 2 weeksfor cycle 8.

## 2016-10-06 NOTE — Progress Notes (Signed)
Patient Care Team: McLean-Scocuzza, Nino Glow, MD as PCP - General (Internal Medicine) Vonna Drafts, FNP as Nurse Practitioner (Nurse Practitioner) Stark Klein, MD as Consulting Physician (General Surgery) Nicholas Lose, MD as Consulting Physician (Hematology and Oncology) Gery Pray, MD as Consulting Physician (Radiation Oncology)  DIAGNOSIS:  Encounter Diagnosis  Name Primary?  . Malignant neoplasm of upper-inner quadrant of left breast in female, estrogen receptor positive ([place])     SUMMARY OF ONCOLOGIC HISTORY:   Malignant neoplasm of upper-inner quadrant of left breast in female, estrogen receptor positive ([place])   03/04/2016 Initial Diagnosis    Screening detected left breast density posterior medial 1.4 cm by ultrasound axilla negative, grade 3 IDC ER 60%, PR 0%, Ki-67 30%, HER-2 positive ratio 6.04, copy #16; T1 cN0 stage IA clinical stage      03/21/2016 Genetic Testing    NEgative genetic testing on the comprehensive cancer panel and Negative genetic testing for the MSH2 inversion analysis (Boland inversion). The Comprehensive Cancer Panel offered by [facility] includes sequencing and/or deletion duplication testing of the following 32 genes: APC, ATM, AXIN2, BARD1, BMPR1A, BRCA1, BRCA2, BRIP1, CDH1, CDK4, CDKN2A, CHEK2, EPCAM, FANCC, MLH1, MSH2, MSH6, MUTYH, NBN, PALB2, PMS2, POLD1, POLE, PTEN, RAD51C, RAD51D, SCG5/GREM1, SMAD4, STK11, TP53, VHL, and XRCC2.   The report date is March 21, 2016.      07/27/2016 Surgery    Left lumpectomy: IDC grade 2, 2.6 cm, DCIS intermediate grade,0/3 lymph nodes negative, ER 60%, PR 0%, Ki-67 30%, HER-2 positive ratio 6.04, T2 N0 stage II a      09/01/2016 -  Chemotherapy    Abraxane Herceptin weekly 12 followed by Herceptin maintenance every 3 weeks        CHIEF COMPLIANT: Cycle 6 Abraxane Herceptin  INTERVAL HISTORY: [name] is a 51 year old with above-mentioned history left breast cancer currently on adjuvant  chemotherapy. She is here to receive her cycle 6 of Abraxane and Herceptin. She initially had reaction to Herceptin but so far she is doing much better. Denies any fevers or chills. Denies neuropathy.  REVIEW OF SYSTEMS:   Constitutional: Denies fevers, chills or abnormal weight loss Eyes: Denies blurriness of vision Ears, nose, mouth, throat, and face: Denies mucositis or sore throat Respiratory: Denies cough, dyspnea or wheezes Cardiovascular: Denies palpitation, chest discomfort Gastrointestinal:  Denies nausea, heartburn or change in bowel habits Skin: Denies abnormal skin rashes Lymphatics: Denies new lymphadenopathy or easy bruising Neurological: Intermittent numbness and tingling in the fingers. Behavioral/Psych: Mood is stable, no new changes  Extremities: No lower extremity edema Breast:  denies any pain or lumps or nodules in either breasts All other systems were reviewed with the patient and are negative.  I have reviewed the past medical history, past surgical history, social history and family history with the patient and they are unchanged from previous note.  ALLERGIES:  is allergic to amoxicillin; ampicillin; and sulfa antibiotics.  MEDICATIONS:  Current Outpatient Prescriptions  Medication Sig Dispense Refill  . acetaminophen (TYLENOL) 500 MG tablet Take 1,000 mg by mouth every 6 (six) hours as needed for moderate pain or headache.     . albuterol (PROVENTIL HFA;VENTOLIN HFA) 108 (90 Base) MCG/ACT inhaler Inhale 2 puffs into the lungs every 6 (six) hours as needed for wheezing or shortness of breath. 1 Inhaler 2  . anastrozole (ARIMIDEX) 1 MG tablet Take 1 mg by mouth daily.  1  . Azelastine-Fluticasone 137-50 MCG/ACT SUSP Place 1 spray into the nose 2 (two) times daily. (Patient not  taking: Reported on 07/14/2016) 23 g 5  . cetirizine (ZYRTEC) 10 MG tablet Take 1 tablet (10 mg total) by mouth at bedtime. (Patient taking differently: Take 10 mg by mouth daily. ) 30 tablet  5  . chlorpheniramine-HYDROcodone (TUSSIONEX PENNKINETIC ER) 10-8 MG/5ML SUER Take 5 mLs by mouth every 12 (twelve) hours as needed for cough. 150 mL 0  . fluticasone (FLONASE) 50 MCG/ACT nasal spray Place 2 sprays into both nostrils daily. (Patient taking differently: Place 2 sprays into both nostrils daily as needed for allergies. ) 16 g 5  . Fluticasone-Salmeterol (ADVAIR DISKUS) 250-50 MCG/DOSE AEPB Inhale 1 puff into the lungs 2 (two) times daily. 60 each 5  . glucose blood (BAYER CONTOUR TEST) test strip TEST twice a day    . insulin glargine (LANTUS) 100 UNIT/ML injection Inject 0.25 mLs (25 Units total) into the skin at bedtime. (Patient not taking: Reported on 07/14/2016) 10 mL 0  . lidocaine-prilocaine (EMLA) cream Apply to affected area once 30 g 3  . losartan (COZAAR) 100 MG tablet Take 1 tablet (100 mg total) by mouth daily. (Patient taking differently: Take 50 mg by mouth daily. ) 30 tablet 0  . metFORMIN (GLUCOPHAGE) 500 MG tablet Take 1 tablet (500 mg total) by mouth 2 (two) times daily with a meal. 60 tablet 2  . metoprolol tartrate (LOPRESSOR) 25 MG tablet Take 1 tablet (25 mg total) by mouth 2 (two) times daily. 60 tablet 2  . ondansetron (ZOFRAN) 8 MG tablet Take 1 tablet (8 mg total) by mouth 2 (two) times daily as needed for nausea or vomiting. 30 tablet 1  . OPSUMIT 10 MG TABS Take 10 mg by mouth daily.  8  . oxyCODONE (OXY IR/ROXICODONE) 5 MG immediate release tablet Take 1-2 tablets (5-10 mg total) by mouth every 4 (four) hours as needed for moderate pain. 30 tablet 0  . Polyethyl Glycol-Propyl Glycol (SYSTANE OP) Apply 1 drop to eye daily as needed (dry eyes).    . promethazine-codeine (PHENERGAN WITH CODEINE) 6.25-10 MG/5ML syrup Take 5 mLs by mouth every 4 (four) hours as needed for cough.   0  . sildenafil (REVATIO) 20 MG tablet Take 20 mg by mouth 3 (three) times daily.    Marland Kitchen spironolactone (ALDACTONE) 50 MG tablet Take 50 mg by mouth daily.     Marland Kitchen torsemide (DEMADEX) 20  MG tablet Take 20 mg by mouth 2 (two) times daily.    Marland Kitchen UPTRAVI 200 MCG TABS Take 200 mcg by mouth 2 (two) times daily.  10   No current facility-administered medications for this visit.     PHYSICAL EXAMINATION: ECOG PERFORMANCE STATUS: 0 - Asymptomatic  Vitals:   10/06/16 1046  BP: (!) 93/59  Pulse: 73  Resp: 17  Temp: 98.4 F (36.9 C)   Filed Weights   10/06/16 1046  Weight: 237 lb 9.6 oz (107.8 kg)    GENERAL:alert, no distress and comfortable SKIN: skin color, texture, turgor are normal, no rashes or significant lesions EYES: normal, Conjunctiva are pink and non-injected, sclera clear OROPHARYNX:no exudate, no erythema and lips, buccal mucosa, and tongue normal  NECK: supple, thyroid normal size, non-tender, without nodularity LYMPH:  no palpable lymphadenopathy in the cervical, axillary or inguinal LUNGS: clear to auscultation and percussion with normal breathing effort HEART: regular rate & rhythm and no murmurs and no lower extremity edema ABDOMEN:abdomen soft, non-tender and normal bowel sounds MUSCULOSKELETAL:no cyanosis of digits and no clubbing  NEURO: alert & oriented x 3 with  fluent speech, no focal motor/sensory deficits EXTREMITIES: No lower extremity edema  LABORATORY DATA:  I have reviewed the data as listed   Chemistry      Component Value Date/Time   NA 144 09/29/2016 1057   K 4.0 09/29/2016 1057   CL 103 07/28/2016 0335   CO2 26 09/29/2016 1057   BUN 11.1 09/29/2016 1057   CREATININE 0.9 09/29/2016 1057      Component Value Date/Time   CALCIUM 9.2 09/29/2016 1057   ALKPHOS 82 09/29/2016 1057   AST 19 09/29/2016 1057   ALT 18 09/29/2016 1057   BILITOT 0.64 09/29/2016 1057       Lab Results  Component Value Date   WBC 5.8 10/06/2016   HGB 11.4 (L) 10/06/2016   HCT 33.8 (L) 10/06/2016   MCV 98.3 10/06/2016   PLT 174 10/06/2016   NEUTROABS 3.7 10/06/2016    ASSESSMENT & PLAN:  Malignant neoplasm of upper-inner quadrant of left  breast in female, estrogen receptor positive ([place]) Left lumpectomy 07/28/2016: IDC grade 2, 2.6 cm, DCIS intermediate grade,0/3 lymph nodes negative, ER 60%, PR 0%, Ki-67 30%, HER-2 positive ratio 6.04, T2 N0 stage II a  Recommendation: 1. Adjuvant chemotherapy with Abraxane and Herceptin (cannot take Taxol because she could not receive steroids)weekly 12 followed by every 3 week Herceptin for one year 2. followed by adjuvant radiation 3. Followed by adjuvant antiestrogen therapy ------------------------------------------------------------------------------------------------------------------------------- Current treatment: Cycle 6day 1 Abraxane Herceptin Labs have been reviewed  Chemo Toxicities: Denies any nausea vomiting 1. Fatigue 2. Reacton to Herceptin : Slowed the infusion and added Pepcid 3. Alopecia 4. Anemia grade 1 5. Chemotherapy-induced neuropathy: Monitoring closely.  Closely monitoring for chemotherapy toxicities Return to clinic in 2 weeksfor cycle 8.  I spent 25 minutes talking to the patient of which more than half was spent in counseling and coordination of care.  No orders of the defined types were placed in this encounter.  The patient has a good understanding of the overall plan. she agrees with it. she will call with any problems that may develop before the next visit here.   Rulon Eisenmenger, MD 10/06/16

## 2016-10-06 NOTE — Patient Instructions (Signed)
[facility] Discharge Instructions for Patients Receiving Chemotherapy  Today you received the following chemotherapy agents: Abraxane and Herceptin   To help prevent nausea and vomiting after your treatment, we encourage you to take your nausea medication as directed.    If you develop nausea and vomiting that is not controlled by your nausea medication, call the clinic.   BELOW ARE SYMPTOMS THAT SHOULD BE REPORTED IMMEDIATELY:  *FEVER GREATER THAN 100.5 F  *CHILLS WITH OR WITHOUT FEVER  NAUSEA AND VOMITING THAT IS NOT CONTROLLED WITH YOUR NAUSEA MEDICATION  *UNUSUAL SHORTNESS OF BREATH  *UNUSUAL BRUISING OR BLEEDING  TENDERNESS IN MOUTH AND THROAT WITH OR WITHOUT PRESENCE OF ULCERS  *URINARY PROBLEMS  *BOWEL PROBLEMS  UNUSUAL RASH Items with * indicate a potential emergency and should be followed up as soon as possible.  Feel free to call the clinic you have any questions or concerns. The clinic phone number is [phone].  Please show the CHEMO ALERT CARD at check-in to the Emergency Department and triage nurse.   

## 2016-10-06 NOTE — Patient Instructions (Signed)

## 2016-10-13 ENCOUNTER — Ambulatory Visit: Payer: BLUE CROSS/BLUE SHIELD

## 2016-10-13 ENCOUNTER — Ambulatory Visit (HOSPITAL_BASED_OUTPATIENT_CLINIC_OR_DEPARTMENT_OTHER): Payer: BLUE CROSS/BLUE SHIELD

## 2016-10-13 ENCOUNTER — Other Ambulatory Visit (HOSPITAL_BASED_OUTPATIENT_CLINIC_OR_DEPARTMENT_OTHER): Payer: BLUE CROSS/BLUE SHIELD

## 2016-10-13 VITALS — BP 115/76 | HR 80 | Temp 98.2°F | Resp 17

## 2016-10-13 DIAGNOSIS — Z5112 Encounter for antineoplastic immunotherapy: Secondary | ICD-10-CM | POA: Diagnosis not present

## 2016-10-13 DIAGNOSIS — D259 Leiomyoma of uterus, unspecified: Secondary | ICD-10-CM

## 2016-10-13 DIAGNOSIS — Z5111 Encounter for antineoplastic chemotherapy: Secondary | ICD-10-CM | POA: Diagnosis not present

## 2016-10-13 DIAGNOSIS — C50212 Malignant neoplasm of upper-inner quadrant of left female breast: Secondary | ICD-10-CM

## 2016-10-13 DIAGNOSIS — Z170 Estrogen receptor positive status [ER+]: Principal | ICD-10-CM

## 2016-10-13 DIAGNOSIS — Z95828 Presence of other vascular implants and grafts: Secondary | ICD-10-CM

## 2016-10-13 DIAGNOSIS — D649 Anemia, unspecified: Secondary | ICD-10-CM

## 2016-10-13 LAB — CBC WITH DIFFERENTIAL/PLATELET
BASO%: 0.7 % (ref 0.0–2.0)
Basophils Absolute: 0 10*3/uL (ref 0.0–0.1)
EOS%: 0.8 % (ref 0.0–7.0)
Eosinophils Absolute: 0.1 10*3/uL (ref 0.0–0.5)
HEMATOCRIT: 31.8 % — AB (ref 34.8–46.6)
HGB: 10.5 g/dL — ABNORMAL LOW (ref 11.6–15.9)
LYMPH%: 20.5 % (ref 14.0–49.7)
MCH: 32.9 pg (ref 25.1–34.0)
MCHC: 33 g/dL (ref 31.5–36.0)
MCV: 99.7 fL (ref 79.5–101.0)
MONO#: 0.3 10*3/uL (ref 0.1–0.9)
MONO%: 4.6 % (ref 0.0–14.0)
NEUT#: 4.5 10*3/uL (ref 1.5–6.5)
NEUT%: 73.4 % (ref 38.4–76.8)
Platelets: 186 10*3/uL (ref 145–400)
RBC: 3.19 10*6/uL — ABNORMAL LOW (ref 3.70–5.45)
RDW: 16.6 % — ABNORMAL HIGH (ref 11.2–14.5)
WBC: 6.1 10*3/uL (ref 3.9–10.3)
lymph#: 1.3 10*3/uL (ref 0.9–3.3)
nRBC: 2 % — ABNORMAL HIGH (ref 0–0)

## 2016-10-13 LAB — COMPREHENSIVE METABOLIC PANEL
ALT: 14 U/L (ref 0–55)
AST: 18 U/L (ref 5–34)
Albumin: 3.8 g/dL (ref 3.5–5.0)
Alkaline Phosphatase: 82 U/L (ref 40–150)
Anion Gap: 10 mEq/L (ref 3–11)
BUN: 16.4 mg/dL (ref 7.0–26.0)
CALCIUM: 9.5 mg/dL (ref 8.4–10.4)
CHLORIDE: 105 meq/L (ref 98–109)
CO2: 27 mEq/L (ref 22–29)
Creatinine: 1.1 mg/dL (ref 0.6–1.1)
EGFR: 66 mL/min/{1.73_m2} — ABNORMAL LOW (ref >90)
Glucose: 139 mg/dl (ref 70–140)
POTASSIUM: 3.8 meq/L (ref 3.5–5.1)
SODIUM: 142 meq/L (ref 136–145)
Total Bilirubin: 0.81 mg/dL (ref 0.20–1.20)
Total Protein: 7.2 g/dL (ref 6.4–8.3)

## 2016-10-13 MED ORDER — ACETAMINOPHEN 325 MG PO TABS
ORAL_TABLET | ORAL | Status: AC
  Filled 2016-10-13: qty 2

## 2016-10-13 MED ORDER — SODIUM CHLORIDE 0.9 % IV SOLN
Freq: Once | INTRAVENOUS | Status: AC
  Administered 2016-10-13: 12:00:00 via INTRAVENOUS

## 2016-10-13 MED ORDER — TRASTUZUMAB CHEMO 150 MG IV SOLR
2.0000 mg/kg | Freq: Once | INTRAVENOUS | Status: AC
  Administered 2016-10-13: 210 mg via INTRAVENOUS
  Filled 2016-10-13: qty 10

## 2016-10-13 MED ORDER — HEPARIN SOD (PORK) LOCK FLUSH 100 UNIT/ML IV SOLN
500.0000 [IU] | Freq: Once | INTRAVENOUS | Status: AC | PRN
  Administered 2016-10-13: 500 [IU]
  Filled 2016-10-13: qty 5

## 2016-10-13 MED ORDER — PACLITAXEL PROTEIN-BOUND CHEMO INJECTION 100 MG
65.0000 mg/m2 | Freq: Once | INTRAVENOUS | Status: AC
  Administered 2016-10-13: 150 mg via INTRAVENOUS
  Filled 2016-10-13: qty 30

## 2016-10-13 MED ORDER — FAMOTIDINE IN NACL 20-0.9 MG/50ML-% IV SOLN
20.0000 mg | Freq: Once | INTRAVENOUS | Status: AC
  Administered 2016-10-13: 20 mg via INTRAVENOUS

## 2016-10-13 MED ORDER — FAMOTIDINE IN NACL 20-0.9 MG/50ML-% IV SOLN
INTRAVENOUS | Status: AC
  Filled 2016-10-13: qty 50

## 2016-10-13 MED ORDER — ACETAMINOPHEN 500 MG PO TABS
ORAL_TABLET | ORAL | Status: AC
  Filled 2016-10-13: qty 2

## 2016-10-13 MED ORDER — PROCHLORPERAZINE MALEATE 10 MG PO TABS
ORAL_TABLET | ORAL | Status: AC
  Filled 2016-10-13: qty 1

## 2016-10-13 MED ORDER — SODIUM CHLORIDE 0.9% FLUSH
10.0000 mL | Freq: Once | INTRAVENOUS | Status: AC
  Administered 2016-10-13: 10 mL
  Filled 2016-10-13: qty 10

## 2016-10-13 MED ORDER — DIPHENHYDRAMINE HCL 25 MG PO CAPS
50.0000 mg | ORAL_CAPSULE | Freq: Once | ORAL | Status: AC
  Administered 2016-10-13: 50 mg via ORAL

## 2016-10-13 MED ORDER — PROCHLORPERAZINE MALEATE 10 MG PO TABS
10.0000 mg | ORAL_TABLET | Freq: Once | ORAL | Status: AC
  Administered 2016-10-13: 10 mg via ORAL

## 2016-10-13 MED ORDER — DIPHENHYDRAMINE HCL 25 MG PO CAPS
ORAL_CAPSULE | ORAL | Status: AC
  Filled 2016-10-13: qty 2

## 2016-10-13 MED ORDER — SODIUM CHLORIDE 0.9% FLUSH
10.0000 mL | INTRAVENOUS | Status: DC | PRN
  Administered 2016-10-13: 10 mL
  Filled 2016-10-13: qty 10

## 2016-10-13 MED ORDER — ACETAMINOPHEN 325 MG PO TABS
650.0000 mg | ORAL_TABLET | Freq: Once | ORAL | Status: AC
  Administered 2016-10-13: 650 mg via ORAL

## 2016-10-13 NOTE — Patient Instructions (Signed)
[facility] Discharge Instructions for Patients Receiving Chemotherapy  Today you received the following chemotherapy agents: Herceptin and Abraxane   To help prevent nausea and vomiting after your treatment, we encourage you to take your nausea medication as directed.    If you develop nausea and vomiting that is not controlled by your nausea medication, call the clinic.   BELOW ARE SYMPTOMS THAT SHOULD BE REPORTED IMMEDIATELY:  *FEVER GREATER THAN 100.5 F  *CHILLS WITH OR WITHOUT FEVER  NAUSEA AND VOMITING THAT IS NOT CONTROLLED WITH YOUR NAUSEA MEDICATION  *UNUSUAL SHORTNESS OF BREATH  *UNUSUAL BRUISING OR BLEEDING  TENDERNESS IN MOUTH AND THROAT WITH OR WITHOUT PRESENCE OF ULCERS  *URINARY PROBLEMS  *BOWEL PROBLEMS  UNUSUAL RASH Items with * indicate a potential emergency and should be followed up as soon as possible.  Feel free to call the clinic you have any questions or concerns. The clinic phone number is [phone].  Please show the CHEMO ALERT CARD at check-in to the Emergency Department and triage nurse.   

## 2016-10-15 DIAGNOSIS — N760 Acute vaginitis: Secondary | ICD-10-CM | POA: Diagnosis not present

## 2016-10-15 DIAGNOSIS — B373 Candidiasis of vulva and vagina: Secondary | ICD-10-CM | POA: Diagnosis not present

## 2016-10-15 DIAGNOSIS — C50919 Malignant neoplasm of unspecified site of unspecified female breast: Secondary | ICD-10-CM | POA: Diagnosis not present

## 2016-10-20 ENCOUNTER — Encounter: Payer: Self-pay | Admitting: *Deleted

## 2016-10-20 ENCOUNTER — Other Ambulatory Visit (HOSPITAL_BASED_OUTPATIENT_CLINIC_OR_DEPARTMENT_OTHER): Payer: BLUE CROSS/BLUE SHIELD

## 2016-10-20 ENCOUNTER — Ambulatory Visit (HOSPITAL_BASED_OUTPATIENT_CLINIC_OR_DEPARTMENT_OTHER): Payer: BLUE CROSS/BLUE SHIELD | Admitting: Hematology and Oncology

## 2016-10-20 ENCOUNTER — Encounter: Payer: Self-pay | Admitting: Hematology and Oncology

## 2016-10-20 ENCOUNTER — Ambulatory Visit: Payer: BLUE CROSS/BLUE SHIELD

## 2016-10-20 ENCOUNTER — Ambulatory Visit (HOSPITAL_BASED_OUTPATIENT_CLINIC_OR_DEPARTMENT_OTHER): Payer: BLUE CROSS/BLUE SHIELD

## 2016-10-20 DIAGNOSIS — Z170 Estrogen receptor positive status [ER+]: Secondary | ICD-10-CM | POA: Diagnosis not present

## 2016-10-20 DIAGNOSIS — C50212 Malignant neoplasm of upper-inner quadrant of left female breast: Secondary | ICD-10-CM

## 2016-10-20 DIAGNOSIS — Z5112 Encounter for antineoplastic immunotherapy: Secondary | ICD-10-CM | POA: Diagnosis not present

## 2016-10-20 DIAGNOSIS — Z95828 Presence of other vascular implants and grafts: Secondary | ICD-10-CM

## 2016-10-20 DIAGNOSIS — Z5111 Encounter for antineoplastic chemotherapy: Secondary | ICD-10-CM | POA: Diagnosis not present

## 2016-10-20 LAB — CBC WITH DIFFERENTIAL/PLATELET
BASO%: 0.9 % (ref 0.0–2.0)
BASOS ABS: 0.1 10*3/uL (ref 0.0–0.1)
EOS%: 1.2 % (ref 0.0–7.0)
Eosinophils Absolute: 0.1 10*3/uL (ref 0.0–0.5)
HCT: 32 % — ABNORMAL LOW (ref 34.8–46.6)
HGB: 10.8 g/dL — ABNORMAL LOW (ref 11.6–15.9)
LYMPH#: 1.5 10*3/uL (ref 0.9–3.3)
LYMPH%: 26.2 % (ref 14.0–49.7)
MCH: 34.1 pg — AB (ref 25.1–34.0)
MCHC: 33.8 g/dL (ref 31.5–36.0)
MCV: 100.9 fL (ref 79.5–101.0)
MONO#: 0.3 10*3/uL (ref 0.1–0.9)
MONO%: 5.8 % (ref 0.0–14.0)
NEUT#: 3.8 10*3/uL (ref 1.5–6.5)
NEUT%: 65.9 % (ref 38.4–76.8)
Platelets: 170 10*3/uL (ref 145–400)
RBC: 3.17 10*6/uL — ABNORMAL LOW (ref 3.70–5.45)
RDW: 17.3 % — AB (ref 11.2–14.5)
WBC: 5.8 10*3/uL (ref 3.9–10.3)

## 2016-10-20 LAB — COMPREHENSIVE METABOLIC PANEL
ALT: 14 U/L (ref 0–55)
AST: 19 U/L (ref 5–34)
Albumin: 3.8 g/dL (ref 3.5–5.0)
Alkaline Phosphatase: 82 U/L (ref 40–150)
Anion Gap: 11 mEq/L (ref 3–11)
BUN: 17.3 mg/dL (ref 7.0–26.0)
CHLORIDE: 105 meq/L (ref 98–109)
CO2: 26 meq/L (ref 22–29)
CREATININE: 1.1 mg/dL (ref 0.6–1.1)
Calcium: 9.4 mg/dL (ref 8.4–10.4)
EGFR: 67 mL/min/{1.73_m2} — ABNORMAL LOW (ref >90)
GLUCOSE: 111 mg/dL (ref 70–140)
POTASSIUM: 4 meq/L (ref 3.5–5.1)
SODIUM: 141 meq/L (ref 136–145)
Total Bilirubin: 0.75 mg/dL (ref 0.20–1.20)
Total Protein: 7.3 g/dL (ref 6.4–8.3)

## 2016-10-20 MED ORDER — DIPHENHYDRAMINE HCL 25 MG PO CAPS
50.0000 mg | ORAL_CAPSULE | Freq: Once | ORAL | Status: AC
  Administered 2016-10-20: 50 mg via ORAL

## 2016-10-20 MED ORDER — TRASTUZUMAB CHEMO 150 MG IV SOLR
2.0000 mg/kg | Freq: Once | INTRAVENOUS | Status: AC
  Administered 2016-10-20: 210 mg via INTRAVENOUS
  Filled 2016-10-20: qty 10

## 2016-10-20 MED ORDER — HEPARIN SOD (PORK) LOCK FLUSH 100 UNIT/ML IV SOLN
500.0000 [IU] | Freq: Once | INTRAVENOUS | Status: AC | PRN
  Administered 2016-10-20: 500 [IU]
  Filled 2016-10-20: qty 5

## 2016-10-20 MED ORDER — PACLITAXEL PROTEIN-BOUND CHEMO INJECTION 100 MG
55.0000 mg/m2 | Freq: Once | INTRAVENOUS | Status: AC
  Administered 2016-10-20: 125 mg via INTRAVENOUS
  Filled 2016-10-20: qty 25

## 2016-10-20 MED ORDER — SODIUM CHLORIDE 0.9% FLUSH
10.0000 mL | INTRAVENOUS | Status: DC | PRN
  Administered 2016-10-20: 10 mL
  Filled 2016-10-20: qty 10

## 2016-10-20 MED ORDER — PROCHLORPERAZINE MALEATE 10 MG PO TABS
10.0000 mg | ORAL_TABLET | Freq: Once | ORAL | Status: AC
  Administered 2016-10-20: 10 mg via ORAL

## 2016-10-20 MED ORDER — FAMOTIDINE IN NACL 20-0.9 MG/50ML-% IV SOLN
INTRAVENOUS | Status: AC
  Filled 2016-10-20: qty 50

## 2016-10-20 MED ORDER — FAMOTIDINE IN NACL 20-0.9 MG/50ML-% IV SOLN
20.0000 mg | Freq: Once | INTRAVENOUS | Status: AC
  Administered 2016-10-20: 20 mg via INTRAVENOUS

## 2016-10-20 MED ORDER — SODIUM CHLORIDE 0.9 % IV SOLN
Freq: Once | INTRAVENOUS | Status: AC
  Administered 2016-10-20: 12:00:00 via INTRAVENOUS

## 2016-10-20 MED ORDER — ACETAMINOPHEN 325 MG PO TABS
ORAL_TABLET | ORAL | Status: AC
  Filled 2016-10-20: qty 2

## 2016-10-20 MED ORDER — DIPHENHYDRAMINE HCL 25 MG PO CAPS
ORAL_CAPSULE | ORAL | Status: AC
  Filled 2016-10-20: qty 2

## 2016-10-20 MED ORDER — ACETAMINOPHEN 325 MG PO TABS
650.0000 mg | ORAL_TABLET | Freq: Once | ORAL | Status: AC
  Administered 2016-10-20: 650 mg via ORAL

## 2016-10-20 MED ORDER — SODIUM CHLORIDE 0.9% FLUSH
10.0000 mL | Freq: Once | INTRAVENOUS | Status: AC
  Administered 2016-10-20: 10 mL
  Filled 2016-10-20: qty 10

## 2016-10-20 MED ORDER — PROCHLORPERAZINE MALEATE 10 MG PO TABS
ORAL_TABLET | ORAL | Status: AC
  Filled 2016-10-20: qty 1

## 2016-10-20 NOTE — Patient Instructions (Signed)
[facility] Discharge Instructions for Patients Receiving Chemotherapy  Today you received the following chemotherapy agents: Abraxane and Herceptin   To help prevent nausea and vomiting after your treatment, we encourage you to take your nausea medication as directed.    If you develop nausea and vomiting that is not controlled by your nausea medication, call the clinic.   BELOW ARE SYMPTOMS THAT SHOULD BE REPORTED IMMEDIATELY:  *FEVER GREATER THAN 100.5 F  *CHILLS WITH OR WITHOUT FEVER  NAUSEA AND VOMITING THAT IS NOT CONTROLLED WITH YOUR NAUSEA MEDICATION  *UNUSUAL SHORTNESS OF BREATH  *UNUSUAL BRUISING OR BLEEDING  TENDERNESS IN MOUTH AND THROAT WITH OR WITHOUT PRESENCE OF ULCERS  *URINARY PROBLEMS  *BOWEL PROBLEMS  UNUSUAL RASH Items with * indicate a potential emergency and should be followed up as soon as possible.  Feel free to call the clinic you have any questions or concerns. The clinic phone number is [phone].  Please show the [place] at check-in to the Emergency Department and triage nurse.

## 2016-10-20 NOTE — Progress Notes (Signed)
Patient Care Team: McLean-Scocuzza, Nino Glow, MD as PCP - General (Internal Medicine) Vonna Drafts, FNP as Nurse Practitioner (Nurse Practitioner) Stark Klein, MD as Consulting Physician (General Surgery) Nicholas Lose, MD as Consulting Physician (Hematology and Oncology) Gery Pray, MD as Consulting Physician (Radiation Oncology)  DIAGNOSIS:  Encounter Diagnosis  Name Primary?  . Malignant neoplasm of upper-inner quadrant of left breast in female, estrogen receptor positive ([place])     SUMMARY OF ONCOLOGIC HISTORY:   Malignant neoplasm of upper-inner quadrant of left breast in female, estrogen receptor positive ([place])   03/04/2016 Initial Diagnosis    Screening detected left breast density posterior medial 1.4 cm by ultrasound axilla negative, grade 3 IDC ER 60%, PR 0%, Ki-67 30%, HER-2 positive ratio 6.04, copy #16; T1 cN0 stage IA clinical stage      03/21/2016 Genetic Testing    NEgative genetic testing on the comprehensive cancer panel and Negative genetic testing for the MSH2 inversion analysis ([place] inversion). The Comprehensive Cancer Panel offered by [facility] includes sequencing and/or deletion duplication testing of the following 32 genes: APC, ATM, AXIN2, BARD1, BMPR1A, BRCA1, BRCA2, BRIP1, CDH1, CDK4, CDKN2A, CHEK2, EPCAM, FANCC, MLH1, MSH2, MSH6, MUTYH, NBN, PALB2, PMS2, POLD1, POLE, PTEN, RAD51C, RAD51D, SCG5/GREM1, SMAD4, STK11, TP53, VHL, and XRCC2.   The report date is March 21, 2016.      07/27/2016 Surgery    Left lumpectomy: IDC grade 2, 2.6 cm, DCIS intermediate grade,0/3 lymph nodes negative, ER 60%, PR 0%, Ki-67 30%, HER-2 positive ratio 6.04, T2 N0 stage II a      09/01/2016 -  Chemotherapy    Abraxane Herceptin weekly 12 followed by Herceptin maintenance every 3 weeks        CHIEF COMPLIANT: Cycle 8 Abraxane and Herceptin  INTERVAL HISTORY: [name] is a 51 year old with above-mentioned history of bone left breast cancer currently on  adjuvant chemotherapy with Abraxane and Herceptin. She is tolerating the chemotherapy fairly well. She does have neuropathy in the hands and feet which is being closely watched and monitored. She reports of the neuropathy appears to be worse especially when she crosses her legs. Her symptoms are also worse in the hands as she feels of the hands appear to be swollen.  REVIEW OF SYSTEMS:   Constitutional: Denies fevers, chills or abnormal weight loss Eyes: Denies blurriness of vision Ears, nose, mouth, throat, and face: Denies mucositis or sore throat Respiratory: Denies cough, dyspnea or wheezes Cardiovascular: Denies palpitation, chest discomfort Gastrointestinal:  Denies nausea, heartburn or change in bowel habits Skin: Denies abnormal skin rashes Lymphatics: Denies new lymphadenopathy or easy bruising Neurological: Neuropathy in hands and feet Behavioral/Psych: Mood is stable, no new changes  Extremities: Swelling of the fingers  All other systems were reviewed with the patient and are negative.  I have reviewed the past medical history, past surgical history, social history and family history with the patient and they are unchanged from previous note.  ALLERGIES:  is allergic to amoxicillin; ampicillin; and sulfa antibiotics.  MEDICATIONS:  Current Outpatient Prescriptions  Medication Sig Dispense Refill  . acetaminophen (TYLENOL) 500 MG tablet Take 1,000 mg by mouth every 6 (six) hours as needed for moderate pain or headache.     . albuterol (PROVENTIL HFA;VENTOLIN HFA) 108 (90 Base) MCG/ACT inhaler Inhale 2 puffs into the lungs every 6 (six) hours as needed for wheezing or shortness of breath. 1 Inhaler 2  . anastrozole (ARIMIDEX) 1 MG tablet Take 1 mg by mouth daily.  1  .  Azelastine-Fluticasone 137-50 MCG/ACT SUSP Place 1 spray into the nose 2 (two) times daily. (Patient not taking: Reported on 07/14/2016) 23 g 5  . cetirizine (ZYRTEC) 10 MG tablet Take 1 tablet (10 mg total) by  mouth at bedtime. (Patient taking differently: Take 10 mg by mouth daily. ) 30 tablet 5  . chlorpheniramine-HYDROcodone (TUSSIONEX PENNKINETIC ER) 10-8 MG/5ML SUER Take 5 mLs by mouth every 12 (twelve) hours as needed for cough. 150 mL 0  . fluticasone (FLONASE) 50 MCG/ACT nasal spray Place 2 sprays into both nostrils daily. (Patient taking differently: Place 2 sprays into both nostrils daily as needed for allergies. ) 16 g 5  . Fluticasone-Salmeterol (ADVAIR DISKUS) 250-50 MCG/DOSE AEPB Inhale 1 puff into the lungs 2 (two) times daily. 60 each 5  . glucose blood (BAYER CONTOUR TEST) test strip TEST twice a day    . insulin glargine (LANTUS) 100 UNIT/ML injection Inject 0.25 mLs (25 Units total) into the skin at bedtime. (Patient not taking: Reported on 07/14/2016) 10 mL 0  . lidocaine-prilocaine (EMLA) cream Apply to affected area once 30 g 3  . losartan (COZAAR) 100 MG tablet Take 1 tablet (100 mg total) by mouth daily. (Patient taking differently: Take 50 mg by mouth daily. ) 30 tablet 0  . metFORMIN (GLUCOPHAGE) 500 MG tablet Take 1 tablet (500 mg total) by mouth 2 (two) times daily with a meal. 60 tablet 2  . metoprolol tartrate (LOPRESSOR) 25 MG tablet Take 1 tablet (25 mg total) by mouth 2 (two) times daily. 60 tablet 2  . ondansetron (ZOFRAN) 8 MG tablet Take 1 tablet (8 mg total) by mouth 2 (two) times daily as needed for nausea or vomiting. 30 tablet 1  . OPSUMIT 10 MG TABS Take 10 mg by mouth daily.  8  . oxyCODONE (OXY IR/ROXICODONE) 5 MG immediate release tablet Take 1-2 tablets (5-10 mg total) by mouth every 4 (four) hours as needed for moderate pain. 30 tablet 0  . Polyethyl Glycol-Propyl Glycol (SYSTANE OP) Apply 1 drop to eye daily as needed (dry eyes).    . promethazine-codeine (PHENERGAN WITH CODEINE) 6.25-10 MG/5ML syrup Take 5 mLs by mouth every 4 (four) hours as needed for cough.   0  . sildenafil (REVATIO) 20 MG tablet Take 20 mg by mouth 3 (three) times daily.    Marland Kitchen  spironolactone (ALDACTONE) 50 MG tablet Take 50 mg by mouth daily.     Marland Kitchen torsemide (DEMADEX) 20 MG tablet Take 20 mg by mouth 2 (two) times daily.    Marland Kitchen UPTRAVI 200 MCG TABS Take 200 mcg by mouth 2 (two) times daily.  10   No current facility-administered medications for this visit.    Facility-Administered Medications Ordered in Other Visits  Medication Dose Route Frequency Provider Last Rate Last Dose  . heparin lock flush 100 unit/mL  500 Units Intracatheter Once PRN Nicholas Lose, MD      . PACLitaxel-protein bound (ABRAXANE) chemo infusion 125 mg  55 mg/m2 (Treatment Plan Recorded) Intravenous Once Nicholas Lose, MD      . sodium chloride flush (NS) 0.9 % injection 10 mL  10 mL Intracatheter PRN Nicholas Lose, MD      . trastuzumab (HERCEPTIN) 210 mg in sodium chloride 0.9 % 250 mL chemo infusion  2 mg/kg (Treatment Plan Recorded) Intravenous Once Nicholas Lose, MD        PHYSICAL EXAMINATION: ECOG PERFORMANCE STATUS: 1 - Symptomatic but completely ambulatory  Vitals:   10/20/16 1111  BP: 118/72  Pulse: 85  Resp: 18  Temp: 98.6 F (37 C)   Filed Weights   10/20/16 1111  Weight: 236 lb (107 kg)    GENERAL:alert, no distress and comfortable SKIN: skin color, texture, turgor are normal, no rashes or significant lesions EYES: normal, Conjunctiva are pink and non-injected, sclera clear OROPHARYNX:no exudate, no erythema and lips, buccal mucosa, and tongue normal  NECK: supple, thyroid normal size, non-tender, without nodularity LYMPH:  no palpable lymphadenopathy in the cervical, axillary or inguinal LUNGS: clear to auscultation and percussion with normal breathing effort HEART: regular rate & rhythm and no murmurs and no lower extremity edema ABDOMEN:abdomen soft, non-tender and normal bowel sounds MUSCULOSKELETAL:no cyanosis of digits and no clubbing  NEURO: alert & oriented x 3 with fluent speech, no focal motor/sensory deficits EXTREMITIES: No lower extremity  edema  LABORATORY DATA:  I have reviewed the data as listed   Chemistry      Component Value Date/Time   NA 141 10/20/2016 1044   K 4.0 10/20/2016 1044   CL 103 07/28/2016 0335   CO2 26 10/20/2016 1044   BUN 17.3 10/20/2016 1044   CREATININE 1.1 10/20/2016 1044      Component Value Date/Time   CALCIUM 9.4 10/20/2016 1044   ALKPHOS 82 10/20/2016 1044   AST 19 10/20/2016 1044   ALT 14 10/20/2016 1044   BILITOT 0.75 10/20/2016 1044       Lab Results  Component Value Date   WBC 5.8 10/20/2016   HGB 10.8 (L) 10/20/2016   HCT 32.0 (L) 10/20/2016   MCV 100.9 10/20/2016   PLT 170 10/20/2016   NEUTROABS 3.8 10/20/2016    ASSESSMENT & PLAN:  Malignant neoplasm of upper-inner quadrant of left breast in female, estrogen receptor positive ([facility]) Left lumpectomy 07/28/2016: IDC grade 2, 2.6 cm, DCIS intermediate grade,0/3 lymph nodes negative, ER 60%, PR 0%, Ki-67 30%, HER-2 positive ratio 6.04, T2 N0 stage II a  Recommendation: 1. Adjuvant chemotherapy with Abraxane and Herceptin (cannot take Taxol because she could not receive steroids)weekly 12 followed by every 3 week Herceptin for one year 2. followed by adjuvant radiation 3. Followed by adjuvant antiestrogen therapy ------------------------------------------------------------------------------------------------------------------------------- Current treatment: Cycle 8day 1 Abraxane Herceptin Labs have been reviewed  Chemo Toxicities: Denies any nausea vomiting 1. Fatigue 2. Reacton to Herceptin : Slowed theinfusion and addedPepcid 3. Alopecia 4. Anemia grade 1 5. Chemotherapy-induced neuropathy: We will further reduce the dosage of chemotherapy with cycle 8 Ianticipate that the patient may not be able to receive more than 10 cycles of chemotherapy. We will have to base her decision on chemotherapy on a weekly basis. Closely monitoring for chemotherapy toxicities Return to clinic in 2 weeksfor cycle 10.   I  spent 25 minutes talking to the patient of which more than half was spent in counseling and coordination of care.  No orders of the defined types were placed in this encounter.  The patient has a good understanding of the overall plan. she agrees with it. she will call with any problems that may develop before the next visit here.   Rulon Eisenmenger, MD 10/20/16

## 2016-10-20 NOTE — Assessment & Plan Note (Signed)
Left lumpectomy 07/28/2016: IDC grade 2, 2.6 cm, DCIS intermediate grade,0/3 lymph nodes negative, ER 60%, PR 0%, Ki-67 30%, HER-2 positive ratio 6.04, T2 N0 stage II a  Recommendation: 1. Adjuvant chemotherapy with Abraxane and Herceptin (cannot take Taxol because she could not receive steroids)weekly 12 followed by every 3 week Herceptin for one year 2. followed by adjuvant radiation 3. Followed by adjuvant antiestrogen therapy ------------------------------------------------------------------------------------------------------------------------------- Current treatment: Cycle 8day 1 Abraxane Herceptin Labs have been reviewed  Chemo Toxicities: Denies any nausea vomiting 1. Fatigue 2. Reacton to Herceptin : Slowed theinfusion and addedPepcid 3. Alopecia 4. Anemia grade 1 5. Chemotherapy-induced neuropathy: Monitoring closely.  Closely monitoring for chemotherapy toxicities Return to clinic in 2 weeksfor cycle 10.

## 2016-10-20 NOTE — Patient Instructions (Signed)

## 2016-10-26 DIAGNOSIS — G4733 Obstructive sleep apnea (adult) (pediatric): Secondary | ICD-10-CM | POA: Diagnosis not present

## 2016-10-27 ENCOUNTER — Ambulatory Visit: Payer: BLUE CROSS/BLUE SHIELD

## 2016-10-27 ENCOUNTER — Ambulatory Visit (HOSPITAL_BASED_OUTPATIENT_CLINIC_OR_DEPARTMENT_OTHER): Payer: BLUE CROSS/BLUE SHIELD

## 2016-10-27 ENCOUNTER — Other Ambulatory Visit (HOSPITAL_BASED_OUTPATIENT_CLINIC_OR_DEPARTMENT_OTHER): Payer: BLUE CROSS/BLUE SHIELD

## 2016-10-27 VITALS — BP 122/70 | HR 87 | Temp 98.2°F | Resp 18

## 2016-10-27 DIAGNOSIS — C50212 Malignant neoplasm of upper-inner quadrant of left female breast: Secondary | ICD-10-CM

## 2016-10-27 DIAGNOSIS — Z5112 Encounter for antineoplastic immunotherapy: Secondary | ICD-10-CM | POA: Diagnosis not present

## 2016-10-27 DIAGNOSIS — Z5111 Encounter for antineoplastic chemotherapy: Secondary | ICD-10-CM | POA: Diagnosis not present

## 2016-10-27 DIAGNOSIS — Z170 Estrogen receptor positive status [ER+]: Principal | ICD-10-CM

## 2016-10-27 DIAGNOSIS — D259 Leiomyoma of uterus, unspecified: Secondary | ICD-10-CM

## 2016-10-27 DIAGNOSIS — D649 Anemia, unspecified: Secondary | ICD-10-CM

## 2016-10-27 DIAGNOSIS — Z95828 Presence of other vascular implants and grafts: Secondary | ICD-10-CM

## 2016-10-27 LAB — CBC WITH DIFFERENTIAL/PLATELET
BASO%: 0.5 % (ref 0.0–2.0)
Basophils Absolute: 0 10*3/uL (ref 0.0–0.1)
EOS%: 1.3 % (ref 0.0–7.0)
Eosinophils Absolute: 0.1 10*3/uL (ref 0.0–0.5)
HCT: 34.3 % — ABNORMAL LOW (ref 34.8–46.6)
HEMOGLOBIN: 11.4 g/dL — AB (ref 11.6–15.9)
LYMPH%: 22.6 % (ref 14.0–49.7)
MCH: 34.2 pg — ABNORMAL HIGH (ref 25.1–34.0)
MCHC: 33.2 g/dL (ref 31.5–36.0)
MCV: 103 fL — ABNORMAL HIGH (ref 79.5–101.0)
MONO#: 0.5 10*3/uL (ref 0.1–0.9)
MONO%: 7.2 % (ref 0.0–14.0)
NEUT%: 68.4 % (ref 38.4–76.8)
NEUTROS ABS: 5.1 10*3/uL (ref 1.5–6.5)
Platelets: 163 10*3/uL (ref 145–400)
RBC: 3.33 10*6/uL — AB (ref 3.70–5.45)
RDW: 18 % — AB (ref 11.2–14.5)
WBC: 7.5 10*3/uL (ref 3.9–10.3)
lymph#: 1.7 10*3/uL (ref 0.9–3.3)

## 2016-10-27 LAB — COMPREHENSIVE METABOLIC PANEL
ALBUMIN: 3.7 g/dL (ref 3.5–5.0)
ALT: 13 U/L (ref 0–55)
AST: 18 U/L (ref 5–34)
Alkaline Phosphatase: 81 U/L (ref 40–150)
Anion Gap: 9 mEq/L (ref 3–11)
BILIRUBIN TOTAL: 0.92 mg/dL (ref 0.20–1.20)
BUN: 14.2 mg/dL (ref 7.0–26.0)
CO2: 27 meq/L (ref 22–29)
CREATININE: 0.9 mg/dL (ref 0.6–1.1)
Calcium: 9.1 mg/dL (ref 8.4–10.4)
Chloride: 105 mEq/L (ref 98–109)
EGFR: 82 mL/min/{1.73_m2} — ABNORMAL LOW (ref >90)
GLUCOSE: 115 mg/dL (ref 70–140)
Potassium: 3.5 mEq/L (ref 3.5–5.1)
SODIUM: 142 meq/L (ref 136–145)
TOTAL PROTEIN: 7.1 g/dL (ref 6.4–8.3)

## 2016-10-27 MED ORDER — DIPHENHYDRAMINE HCL 25 MG PO CAPS
50.0000 mg | ORAL_CAPSULE | Freq: Once | ORAL | Status: AC
  Administered 2016-10-27: 50 mg via ORAL

## 2016-10-27 MED ORDER — SODIUM CHLORIDE 0.9% FLUSH
10.0000 mL | INTRAVENOUS | Status: DC | PRN
  Administered 2016-10-27: 10 mL
  Filled 2016-10-27: qty 10

## 2016-10-27 MED ORDER — DIPHENHYDRAMINE HCL 25 MG PO CAPS
ORAL_CAPSULE | ORAL | Status: AC
  Filled 2016-10-27: qty 2

## 2016-10-27 MED ORDER — SODIUM CHLORIDE 0.9% FLUSH
10.0000 mL | Freq: Once | INTRAVENOUS | Status: AC
  Administered 2016-10-27: 10 mL
  Filled 2016-10-27: qty 10

## 2016-10-27 MED ORDER — PROCHLORPERAZINE MALEATE 10 MG PO TABS
10.0000 mg | ORAL_TABLET | Freq: Once | ORAL | Status: AC
  Administered 2016-10-27: 10 mg via ORAL

## 2016-10-27 MED ORDER — SODIUM CHLORIDE 0.9 % IV SOLN
Freq: Once | INTRAVENOUS | Status: AC
  Administered 2016-10-27: 12:00:00 via INTRAVENOUS

## 2016-10-27 MED ORDER — HEPARIN SOD (PORK) LOCK FLUSH 100 UNIT/ML IV SOLN
500.0000 [IU] | Freq: Once | INTRAVENOUS | Status: AC | PRN
  Administered 2016-10-27: 500 [IU]
  Filled 2016-10-27: qty 5

## 2016-10-27 MED ORDER — ACETAMINOPHEN 325 MG PO TABS
ORAL_TABLET | ORAL | Status: AC
  Filled 2016-10-27: qty 2

## 2016-10-27 MED ORDER — TRASTUZUMAB CHEMO 150 MG IV SOLR
2.0000 mg/kg | Freq: Once | INTRAVENOUS | Status: AC
  Administered 2016-10-27: 210 mg via INTRAVENOUS
  Filled 2016-10-27: qty 10

## 2016-10-27 MED ORDER — FAMOTIDINE IN NACL 20-0.9 MG/50ML-% IV SOLN
INTRAVENOUS | Status: AC
  Filled 2016-10-27: qty 50

## 2016-10-27 MED ORDER — FAMOTIDINE IN NACL 20-0.9 MG/50ML-% IV SOLN
20.0000 mg | Freq: Once | INTRAVENOUS | Status: AC
  Administered 2016-10-27: 20 mg via INTRAVENOUS

## 2016-10-27 MED ORDER — PROCHLORPERAZINE MALEATE 10 MG PO TABS
ORAL_TABLET | ORAL | Status: AC
  Filled 2016-10-27: qty 1

## 2016-10-27 MED ORDER — ACETAMINOPHEN 325 MG PO TABS
650.0000 mg | ORAL_TABLET | Freq: Once | ORAL | Status: AC
Start: 2016-10-27 — End: 2016-10-27
  Administered 2016-10-27: 650 mg via ORAL

## 2016-10-27 MED ORDER — PACLITAXEL PROTEIN-BOUND CHEMO INJECTION 100 MG
55.0000 mg/m2 | Freq: Once | INTRAVENOUS | Status: AC
  Administered 2016-10-27: 125 mg via INTRAVENOUS
  Filled 2016-10-27: qty 25

## 2016-11-03 ENCOUNTER — Other Ambulatory Visit (HOSPITAL_BASED_OUTPATIENT_CLINIC_OR_DEPARTMENT_OTHER): Payer: BLUE CROSS/BLUE SHIELD

## 2016-11-03 ENCOUNTER — Ambulatory Visit: Payer: BLUE CROSS/BLUE SHIELD

## 2016-11-03 ENCOUNTER — Ambulatory Visit (HOSPITAL_BASED_OUTPATIENT_CLINIC_OR_DEPARTMENT_OTHER): Payer: BLUE CROSS/BLUE SHIELD | Admitting: Hematology and Oncology

## 2016-11-03 ENCOUNTER — Ambulatory Visit (HOSPITAL_BASED_OUTPATIENT_CLINIC_OR_DEPARTMENT_OTHER): Payer: BLUE CROSS/BLUE SHIELD

## 2016-11-03 DIAGNOSIS — G620 Drug-induced polyneuropathy: Secondary | ICD-10-CM | POA: Diagnosis not present

## 2016-11-03 DIAGNOSIS — D6481 Anemia due to antineoplastic chemotherapy: Secondary | ICD-10-CM

## 2016-11-03 DIAGNOSIS — Z170 Estrogen receptor positive status [ER+]: Secondary | ICD-10-CM

## 2016-11-03 DIAGNOSIS — D701 Agranulocytosis secondary to cancer chemotherapy: Secondary | ICD-10-CM | POA: Diagnosis not present

## 2016-11-03 DIAGNOSIS — Z5112 Encounter for antineoplastic immunotherapy: Secondary | ICD-10-CM | POA: Diagnosis not present

## 2016-11-03 DIAGNOSIS — C50212 Malignant neoplasm of upper-inner quadrant of left female breast: Secondary | ICD-10-CM

## 2016-11-03 DIAGNOSIS — Z95828 Presence of other vascular implants and grafts: Secondary | ICD-10-CM

## 2016-11-03 DIAGNOSIS — T451X5A Adverse effect of antineoplastic and immunosuppressive drugs, initial encounter: Secondary | ICD-10-CM

## 2016-11-03 DIAGNOSIS — D649 Anemia, unspecified: Secondary | ICD-10-CM

## 2016-11-03 DIAGNOSIS — D259 Leiomyoma of uterus, unspecified: Secondary | ICD-10-CM

## 2016-11-03 LAB — COMPREHENSIVE METABOLIC PANEL
ALBUMIN: 3.7 g/dL (ref 3.5–5.0)
ALK PHOS: 85 U/L (ref 40–150)
ALT: 12 U/L (ref 0–55)
ANION GAP: 8 meq/L (ref 3–11)
AST: 17 U/L (ref 5–34)
BUN: 14.5 mg/dL (ref 7.0–26.0)
CO2: 26 mEq/L (ref 22–29)
CREATININE: 1 mg/dL (ref 0.6–1.1)
Calcium: 9.1 mg/dL (ref 8.4–10.4)
Chloride: 105 mEq/L (ref 98–109)
EGFR: 76 mL/min/{1.73_m2} — AB (ref >90)
GLUCOSE: 150 mg/dL — AB (ref 70–140)
POTASSIUM: 3.7 meq/L (ref 3.5–5.1)
SODIUM: 139 meq/L (ref 136–145)
TOTAL PROTEIN: 7.1 g/dL (ref 6.4–8.3)
Total Bilirubin: 0.69 mg/dL (ref 0.20–1.20)

## 2016-11-03 LAB — CBC WITH DIFFERENTIAL/PLATELET
BASO%: 0.8 % (ref 0.0–2.0)
BASOS ABS: 0.1 10*3/uL (ref 0.0–0.1)
EOS ABS: 0.1 10*3/uL (ref 0.0–0.5)
EOS%: 1.5 % (ref 0.0–7.0)
HCT: 34.8 % (ref 34.8–46.6)
HEMOGLOBIN: 11.4 g/dL — AB (ref 11.6–15.9)
LYMPH%: 20.9 % (ref 14.0–49.7)
MCH: 34 pg (ref 25.1–34.0)
MCHC: 32.8 g/dL (ref 31.5–36.0)
MCV: 103.9 fL — AB (ref 79.5–101.0)
MONO#: 0.4 10*3/uL (ref 0.1–0.9)
MONO%: 5.4 % (ref 0.0–14.0)
NEUT%: 71.4 % (ref 38.4–76.8)
NEUTROS ABS: 4.7 10*3/uL (ref 1.5–6.5)
PLATELETS: 161 10*3/uL (ref 145–400)
RBC: 3.35 10*6/uL — ABNORMAL LOW (ref 3.70–5.45)
RDW: 17.4 % — AB (ref 11.2–14.5)
WBC: 6.5 10*3/uL (ref 3.9–10.3)
lymph#: 1.4 10*3/uL (ref 0.9–3.3)

## 2016-11-03 MED ORDER — ACETAMINOPHEN 325 MG PO TABS
ORAL_TABLET | ORAL | Status: AC
Start: 2016-11-03 — End: 2016-11-03
  Filled 2016-11-03: qty 2

## 2016-11-03 MED ORDER — FAMOTIDINE IN NACL 20-0.9 MG/50ML-% IV SOLN
INTRAVENOUS | Status: AC
  Filled 2016-11-03: qty 50

## 2016-11-03 MED ORDER — DIPHENHYDRAMINE HCL 25 MG PO CAPS
50.0000 mg | ORAL_CAPSULE | Freq: Once | ORAL | Status: AC
  Administered 2016-11-03: 50 mg via ORAL

## 2016-11-03 MED ORDER — HEPARIN SOD (PORK) LOCK FLUSH 100 UNIT/ML IV SOLN
500.0000 [IU] | Freq: Once | INTRAVENOUS | Status: AC | PRN
  Administered 2016-11-03: 500 [IU]
  Filled 2016-11-03: qty 5

## 2016-11-03 MED ORDER — FAMOTIDINE IN NACL 20-0.9 MG/50ML-% IV SOLN
20.0000 mg | Freq: Once | INTRAVENOUS | Status: AC
  Administered 2016-11-03: 20 mg via INTRAVENOUS

## 2016-11-03 MED ORDER — SODIUM CHLORIDE 0.9% FLUSH
10.0000 mL | Freq: Once | INTRAVENOUS | Status: AC
  Administered 2016-11-03: 10 mL
  Filled 2016-11-03: qty 10

## 2016-11-03 MED ORDER — DIPHENHYDRAMINE HCL 25 MG PO CAPS
ORAL_CAPSULE | ORAL | Status: AC
  Filled 2016-11-03: qty 2

## 2016-11-03 MED ORDER — SODIUM CHLORIDE 0.9 % IV SOLN
Freq: Once | INTRAVENOUS | Status: AC
  Administered 2016-11-03: 11:00:00 via INTRAVENOUS

## 2016-11-03 MED ORDER — ACETAMINOPHEN 325 MG PO TABS
650.0000 mg | ORAL_TABLET | Freq: Once | ORAL | Status: AC
  Administered 2016-11-03: 650 mg via ORAL

## 2016-11-03 MED ORDER — SODIUM CHLORIDE 0.9% FLUSH
10.0000 mL | INTRAVENOUS | Status: DC | PRN
Start: 2016-11-03 — End: 2016-11-03
  Administered 2016-11-03: 10 mL
  Filled 2016-11-03: qty 10

## 2016-11-03 MED ORDER — SODIUM CHLORIDE 0.9 % IV SOLN
2.0000 mg/kg | Freq: Once | INTRAVENOUS | Status: AC
  Administered 2016-11-03: 210 mg via INTRAVENOUS
  Filled 2016-11-03: qty 10

## 2016-11-03 NOTE — Assessment & Plan Note (Signed)
Left lumpectomy 07/28/2016: IDC grade 2, 2.6 cm, DCIS intermediate grade,0/3 lymph nodes negative, ER 60%, PR 0%, Ki-67 30%, HER-2 positive ratio 6.04, T2 N0 stage II a  Recommendation: 1. Adjuvant chemotherapy with Abraxane and Herceptin (cannot take Taxol because she could not receive steroids)weekly 12 followed by every 3 week Herceptin for one year 2. followed by adjuvant radiation 3. Followed by adjuvant antiestrogen therapy ------------------------------------------------------------------------------------------------------------------------------- Current treatment: Cycle 10day 1 Abraxane Herceptin Labs have been reviewed  Chemo Toxicities: Denies any nausea vomiting 1. Fatigue 2. Reacton to Herceptin : Slowed theinfusion and addedPepcid 3. Alopecia 4. Anemia grade 1 5. Chemotherapy-induced neuropathy: We further reduced the dosage of chemotherapy with cycle 8 Ianticipate that the patient may not be able to receive more than 10 cycles of chemotherapy.   Closely monitoring for chemotherapy toxicities Return to clinic in 2 weeksfor cycle

## 2016-11-03 NOTE — Progress Notes (Signed)
Patient Care Team: McLean-Scocuzza, Nino Glow, MD as PCP - General (Internal Medicine) Vonna Drafts, FNP as Nurse Practitioner (Nurse Practitioner) Stark Klein, MD as Consulting Physician (General Surgery) Nicholas Lose, MD as Consulting Physician (Hematology and Oncology) Gery Pray, MD as Consulting Physician (Radiation Oncology)  DIAGNOSIS:  Encounter Diagnosis  Name Primary?  . Malignant neoplasm of upper-inner quadrant of left breast in female, estrogen receptor positive ([place])     SUMMARY OF ONCOLOGIC HISTORY:   Malignant neoplasm of upper-inner quadrant of left breast in female, estrogen receptor positive ([place])   03/04/2016 Initial Diagnosis    Screening detected left breast density posterior medial 1.4 cm by ultrasound axilla negative, grade 3 IDC ER 60%, PR 0%, Ki-67 30%, HER-2 positive ratio 6.04, copy #16; T1 cN0 stage IA clinical stage      03/21/2016 Genetic Testing    NEgative genetic testing on the comprehensive cancer panel and Negative genetic testing for the MSH2 inversion analysis ([place] inversion). The Comprehensive Cancer Panel offered by [facility] includes sequencing and/or deletion duplication testing of the following 32 genes: APC, ATM, AXIN2, BARD1, BMPR1A, BRCA1, BRCA2, BRIP1, CDH1, CDK4, CDKN2A, CHEK2, EPCAM, FANCC, MLH1, MSH2, MSH6, MUTYH, NBN, PALB2, PMS2, POLD1, POLE, PTEN, RAD51C, RAD51D, SCG5/GREM1, SMAD4, STK11, TP53, VHL, and XRCC2.   The report date is March 21, 2016.      07/27/2016 Surgery    Left lumpectomy: IDC grade 2, 2.6 cm, DCIS intermediate grade,0/3 lymph nodes negative, ER 60%, PR 0%, Ki-67 30%, HER-2 positive ratio 6.04, T2 N0 stage II a      09/01/2016 -  Chemotherapy    Abraxane Herceptin weekly 12 followed by Herceptin maintenance every 3 weeks        CHIEF COMPLIANT: Today supposedly cycle 10 Abraxane Herceptin but being discontinued for neuropathy  INTERVAL HISTORY: [name] is a 51 year old with above-mentioned  history of HER-2 positive left breast cancer currently on Abraxane Herceptin. Today is cycle #10 however we are discontinuing her treatment because she has progressively worsening neuropathy. She tells me that she was unable to button her shirt. This is not associated with pain. Her fatigue is also getting worse.  REVIEW OF SYSTEMS:   Constitutional: Denies fevers, chills or abnormal weight loss, severe fatigue Eyes: Denies blurriness of vision Ears, nose, mouth, throat, and face: Denies mucositis or sore throat Respiratory: Denies cough, dyspnea or wheezes Cardiovascular: Denies palpitation, chest discomfort Gastrointestinal:  Denies nausea, heartburn or change in bowel habits Skin: Denies abnormal skin rashes Lymphatics: Denies new lymphadenopathy or easy bruising Neurological: Grade 2-3 peripheral neuropathy Behavioral/Psych: Mood is stable, no new changes  Extremities: No lower extremity edema  All other systems were reviewed with the patient and are negative.  I have reviewed the past medical history, past surgical history, social history and family history with the patient and they are unchanged from previous note.  ALLERGIES:  is allergic to amoxicillin; ampicillin; and sulfa antibiotics.  MEDICATIONS:  Current Outpatient Prescriptions  Medication Sig Dispense Refill  . acetaminophen (TYLENOL) 500 MG tablet Take 1,000 mg by mouth every 6 (six) hours as needed for moderate pain or headache.     . albuterol (PROVENTIL HFA;VENTOLIN HFA) 108 (90 Base) MCG/ACT inhaler Inhale 2 puffs into the lungs every 6 (six) hours as needed for wheezing or shortness of breath. 1 Inhaler 2  . anastrozole (ARIMIDEX) 1 MG tablet Take 1 mg by mouth daily.  1  . Azelastine-Fluticasone 137-50 MCG/ACT SUSP Place 1 spray into the nose 2 (two)  times daily. (Patient not taking: Reported on 07/14/2016) 23 g 5  . cetirizine (ZYRTEC) 10 MG tablet Take 1 tablet (10 mg total) by mouth at bedtime. (Patient taking  differently: Take 10 mg by mouth daily. ) 30 tablet 5  . chlorpheniramine-HYDROcodone (TUSSIONEX PENNKINETIC ER) 10-8 MG/5ML SUER Take 5 mLs by mouth every 12 (twelve) hours as needed for cough. 150 mL 0  . fluticasone (FLONASE) 50 MCG/ACT nasal spray Place 2 sprays into both nostrils daily. (Patient taking differently: Place 2 sprays into both nostrils daily as needed for allergies. ) 16 g 5  . Fluticasone-Salmeterol (ADVAIR DISKUS) 250-50 MCG/DOSE AEPB Inhale 1 puff into the lungs 2 (two) times daily. 60 each 5  . glucose blood (BAYER CONTOUR TEST) test strip TEST twice a day    . insulin glargine (LANTUS) 100 UNIT/ML injection Inject 0.25 mLs (25 Units total) into the skin at bedtime. (Patient not taking: Reported on 07/14/2016) 10 mL 0  . lidocaine-prilocaine (EMLA) cream Apply to affected area once 30 g 3  . losartan (COZAAR) 100 MG tablet Take 1 tablet (100 mg total) by mouth daily. (Patient taking differently: Take 50 mg by mouth daily. ) 30 tablet 0  . metFORMIN (GLUCOPHAGE) 500 MG tablet Take 1 tablet (500 mg total) by mouth 2 (two) times daily with a meal. 60 tablet 2  . metoprolol tartrate (LOPRESSOR) 25 MG tablet Take 1 tablet (25 mg total) by mouth 2 (two) times daily. 60 tablet 2  . ondansetron (ZOFRAN) 8 MG tablet Take 1 tablet (8 mg total) by mouth 2 (two) times daily as needed for nausea or vomiting. 30 tablet 1  . OPSUMIT 10 MG TABS Take 10 mg by mouth daily.  8  . oxyCODONE (OXY IR/ROXICODONE) 5 MG immediate release tablet Take 1-2 tablets (5-10 mg total) by mouth every 4 (four) hours as needed for moderate pain. 30 tablet 0  . Polyethyl Glycol-Propyl Glycol (SYSTANE OP) Apply 1 drop to eye daily as needed (dry eyes).    . promethazine-codeine (PHENERGAN WITH CODEINE) 6.25-10 MG/5ML syrup Take 5 mLs by mouth every 4 (four) hours as needed for cough.   0  . sildenafil (REVATIO) 20 MG tablet Take 20 mg by mouth 3 (three) times daily.    Marland Kitchen spironolactone (ALDACTONE) 50 MG tablet Take  50 mg by mouth daily.     Marland Kitchen torsemide (DEMADEX) 20 MG tablet Take 20 mg by mouth 2 (two) times daily.    Marland Kitchen UPTRAVI 200 MCG TABS Take 200 mcg by mouth 2 (two) times daily.  10   No current facility-administered medications for this visit.     PHYSICAL EXAMINATION: ECOG PERFORMANCE STATUS: 1 - Symptomatic but completely ambulatory  There were no vitals filed for this visit. There were no vitals filed for this visit.  GENERAL:alert, no distress and comfortable SKIN: skin color, texture, turgor are normal, no rashes or significant lesions EYES: normal, Conjunctiva are pink and non-injected, sclera clear OROPHARYNX:no exudate, no erythema and lips, buccal mucosa, and tongue normal  NECK: supple, thyroid normal size, non-tender, without nodularity LYMPH:  no palpable lymphadenopathy in the cervical, axillary or inguinal LUNGS: clear to auscultation and percussion with normal breathing effort HEART: regular rate & rhythm and no murmurs and no lower extremity edema ABDOMEN:abdomen soft, non-tender and normal bowel sounds MUSCULOSKELETAL:no cyanosis of digits and no clubbing  NEURO: alert & oriented x 3 with fluent speech, neuropathy grade 2-3 EXTREMITIES: No lower extremity edema  LABORATORY DATA:  I have  reviewed the data as listed   Chemistry      Component Value Date/Time   NA 142 10/27/2016 1113   K 3.5 10/27/2016 1113   CL 103 07/28/2016 0335   CO2 27 10/27/2016 1113   BUN 14.2 10/27/2016 1113   CREATININE 0.9 10/27/2016 1113      Component Value Date/Time   CALCIUM 9.1 10/27/2016 1113   ALKPHOS 81 10/27/2016 1113   AST 18 10/27/2016 1113   ALT 13 10/27/2016 1113   BILITOT 0.92 10/27/2016 1113       Lab Results  Component Value Date   WBC 7.5 10/27/2016   HGB 11.4 (L) 10/27/2016   HCT 34.3 (L) 10/27/2016   MCV 103.0 (H) 10/27/2016   PLT 163 10/27/2016   NEUTROABS 5.1 10/27/2016    ASSESSMENT & PLAN:  Malignant neoplasm of upper-inner quadrant of left breast in  female, estrogen receptor positive ([place]) Left lumpectomy 07/28/2016: IDC grade 2, 2.6 cm, DCIS intermediate grade,0/3 lymph nodes negative, ER 60%, PR 0%, Ki-67 30%, HER-2 positive ratio 6.04, T2 N0 stage II a  Recommendation: 1. Adjuvant chemotherapy with Abraxane and Herceptin (cannot take Taxol because she could not receive steroids)weekly 12 followed by every 3 week Herceptin for one year 2. followed by adjuvant radiation 3. Followed by adjuvant antiestrogen therapy ------------------------------------------------------------------------------------------------------------------------------- Current treatment: Cycle 10day 1 Abraxane Herceptin Labs have been reviewed  Chemo Toxicities: Denies any nausea vomiting 1. Fatigue 2. Reacton to Herceptin : Slowed theinfusion and addedPepcid 3. Alopecia 4. Anemia grade 1 5. Chemotherapy-induced neuropathy: We further reduced the dosage of chemotherapy with cycle 8 Because of continuing worsening neuropathy, I discontinued Abraxane. She will get the last dosage of weekly Herceptin today. She will receive Herceptin every 3 weeks subsequently. I sent a message to Dr. Sondra Come to see her back for adjuvant radiation  Return to clinic in 6 weeks for follow-up with me. She'll get Herceptin every 3 weeks.  I spent 25 minutes talking to the patient of which more than half was spent in counseling and coordination of care.  No orders of the defined types were placed in this encounter.  The patient has a good understanding of the overall plan. she agrees with it. she will call with any problems that may develop before the next visit here.   Rulon Eisenmenger, MD 11/03/16

## 2016-11-03 NOTE — Patient Instructions (Signed)
[facility] Discharge Instructions for Patients Receiving Chemotherapy  Today you received the following chemotherapy agents: Herceptin   To help prevent nausea and vomiting after your treatment, we encourage you to take your nausea medication as directed.    If you develop nausea and vomiting that is not controlled by your nausea medication, call the clinic.   BELOW ARE SYMPTOMS THAT SHOULD BE REPORTED IMMEDIATELY:  *FEVER GREATER THAN 100.5 F  *CHILLS WITH OR WITHOUT FEVER  NAUSEA AND VOMITING THAT IS NOT CONTROLLED WITH YOUR NAUSEA MEDICATION  *UNUSUAL SHORTNESS OF BREATH  *UNUSUAL BRUISING OR BLEEDING  TENDERNESS IN MOUTH AND THROAT WITH OR WITHOUT PRESENCE OF ULCERS  *URINARY PROBLEMS  *BOWEL PROBLEMS  UNUSUAL RASH Items with * indicate a potential emergency and should be followed up as soon as possible.  Feel free to call the clinic you have any questions or concerns. The clinic phone number is [phone].  Please show the [facility] at check-in to the Emergency Department and triage nurse.

## 2016-11-04 NOTE — Progress Notes (Signed)
Location of Breast Cancer: upper-inner quadrant of left breast    Histology per Pathology Report:   07/27/16 Diagnosis 1. Breast, lumpectomy, Left - INVASIVE DUCTAL CARCINOMA, GRADE II/III, SPANNING 2.6 CM. - DUCTAL CARCINOMA IN SITU, INTERMEDIATE GRADE. - THE SURGICAL RESECTION MARGINS ARE NEGATIVE FOR CARCINOMA. - SEE ONCOLOGY TABLE BELOW. 2. Lymph node, sentinel, biopsy, Left axillary - THERE IS NO EVIDENCE OF CARCINOMA IN 1 OF 1 LYMPH NODE (0/1). 3. Lymph node, sentinel, biopsy, Left axillary - THERE IS NO EVIDENCE OF CARCINOMA IN 1 OF 1 LYMPH NODE (0/1). 4. Lymph node, sentinel, biopsy, Left axillary - THERE IS NO EVIDENCE OF CARCINOMA IN 1 OF 1 LYMPH NODE (0/1).  03/04/16 Diagnosis Breast, left, needle core biopsy, 10 o'clock - INVASIVE DUCTAL CARCINOMA.  Receptor Status: ER(60%), PR (0%), Her2-neu (positive), Ki-(30%)  Did patient present with symptoms (if so, please note symptoms) or was this found on screening mammography?: abnormal screening mammogram  Past/Anticipated interventions by surgeon, if any: 07/27/16 - Procedure: BREAST LUMPECTOMY WITH RADIOACTIVE SEED AND SENTINEL LYMPH NODE BIOPSY;  Surgeon: Stark Klein, MD  Past/Anticipated interventions by medical oncology, if GIE:ODDYHMGH chemotherapy with Abraxane and Herceptin (cannot take Taxol because she could not receive steroids)weekly 12 followed by every 3 week Herceptin for one year.   Lymphedema issues, if any:  no  Pain issues, if any:  no   SAFETY ISSUES:  Prior radiation? no  Pacemaker/ICD? no  Possible current pregnancy?no  Is the patient on methotrexate? no  Current Complaints / other details: Patient has finished chemotherapy and is receiving Herceptin now.  BP 126/89 (BP Location: Right Arm, Patient Position: Sitting)   Pulse 87   Temp 98.3 F (36.8 C) (Oral)   Ht 5' 5" (1.651 m)   Wt 231 lb 9.6 oz (105.1 kg)   LMP 05/11/2013   SpO2 93%   BMI 38.54 kg/m    Wt Readings from Last 3  Encounters:  11/09/16 231 lb 9.6 oz (105.1 kg)  11/03/16 234 lb (106.1 kg)  10/20/16 236 lb (107 kg)      Toby Breithaupt, Craige Cotta, RN 11/04/2016,1:45 PM

## 2016-11-05 ENCOUNTER — Encounter: Payer: Self-pay | Admitting: Radiation Oncology

## 2016-11-06 ENCOUNTER — Telehealth: Payer: Self-pay

## 2016-11-06 NOTE — Telephone Encounter (Signed)
Called pt back and lvm with call back number. Pt wanted to clarify some of her infusion appt. Pt states that she will no longer be getting abraxane. Pt is now on herceptin only every 3 weeks. Pt schedules will need to be changed. Will be sending scheduling message to update appts accordingly.

## 2016-11-09 ENCOUNTER — Ambulatory Visit
Admission: RE | Admit: 2016-11-09 | Discharge: 2016-11-09 | Disposition: A | Discharge status: Home / Self Care | Payer: BLUE CROSS/BLUE SHIELD | Source: Ambulatory Visit | Attending: Radiation Oncology | Admitting: Radiation Oncology

## 2016-11-09 ENCOUNTER — Encounter: Payer: Self-pay | Admitting: Radiation Oncology

## 2016-11-09 DIAGNOSIS — Z79811 Long term (current) use of aromatase inhibitors: Secondary | ICD-10-CM | POA: Insufficient documentation

## 2016-11-09 DIAGNOSIS — C50212 Malignant neoplasm of upper-inner quadrant of left female breast: Secondary | ICD-10-CM

## 2016-11-09 DIAGNOSIS — Z170 Estrogen receptor positive status [ER+]: Secondary | ICD-10-CM | POA: Insufficient documentation

## 2016-11-09 DIAGNOSIS — Z79899 Other long term (current) drug therapy: Secondary | ICD-10-CM | POA: Diagnosis not present

## 2016-11-09 DIAGNOSIS — Z880 Allergy status to penicillin: Secondary | ICD-10-CM | POA: Diagnosis not present

## 2016-11-09 DIAGNOSIS — Z882 Allergy status to sulfonamides status: Secondary | ICD-10-CM | POA: Diagnosis not present

## 2016-11-09 DIAGNOSIS — Z510 Encounter for antineoplastic radiation therapy: Secondary | ICD-10-CM | POA: Diagnosis not present

## 2016-11-09 DIAGNOSIS — Z7984 Long term (current) use of oral hypoglycemic drugs: Secondary | ICD-10-CM | POA: Insufficient documentation

## 2016-11-09 DIAGNOSIS — Z9889 Other specified postprocedural states: Secondary | ICD-10-CM | POA: Diagnosis not present

## 2016-11-09 DIAGNOSIS — Z7951 Long term (current) use of inhaled steroids: Secondary | ICD-10-CM | POA: Insufficient documentation

## 2016-11-09 NOTE — Progress Notes (Signed)
Please see the Nurse Progress Note in the MD Initial Consult Encounter for this patient. 

## 2016-11-09 NOTE — Progress Notes (Signed)
Radiation Oncology         [phone] ________________________________  Name: [name] MRN: [id number]  Date: 11/09/2016  DOB: 09/16/1965  Re-Evaluation Visit Note  CC: McLean-Scocuzza, Nino Glow, MD  Nicholas Lose, MD  Diagnosis:     ICD-10-CM   1. Malignant neoplasm of upper-inner quadrant of left breast in female, estrogen receptor positive ([place]) C50.212    Z17.0     51 y.o. female with Clinical stage I Left Breast UIQ Invasive Ductal Carcinoma, ER(+) / PR(-) / Her2(+), Grade 3, Pathological stage IIa T2 N0    Narrative:  The patient returns today for re-evaluation of her breast cancer. She was seen in the Multidisciplinary Breast Clinic on 03/11/2016, where a treatment plan was discussed which included left breast lumpectomy with sentinel lymph node biopsy, chemotherapy, and radiation. Surgery was delayed in light of problems associated with pulmonary hypertension. She underwent left breast lumpectomy with Dr. Barry Dienes on 07/27/2016 which revealed pathologic stage IIa T2 N0 invasive ductal carcinoma, grade 2, spanning 2.6 cm (ER+/PR-/HER2+) and DCIS, intermediate grade. Surgical resection margins were negative for carcinoma as well as 3 left axillary lymph nodes. Following her surgery she was seen by Dr. Lindi Adie to discuss an adjuvant chemotherapy plan. He recommended Abraxane and Herceptin weekly x12 followed by Herceptin every 3 weeks for one year, as well as adjuvant radiation. However, Abraxane has been discontinued after her 10th cycle per Dr. Lindi Adie due to progressively worsening neuropathy and fatigue. The patient's oncological history is detailed below. She is here today to discuss potential radiation therapy options. On review of systems, the patient denies any pain, swelling, or numbness in the surgical area.  ONCOLOGICAL HISTORY:   Malignant neoplasm of upper-inner quadrant of left breast in female, estrogen receptor positive ([place])   03/04/2016 Initial Diagnosis   Screening detected left breast density posterior medial 1.4 cm by ultrasound axilla negative, grade 3 IDC ER 60%, PR 0%, Ki-67 30%, HER-2 positive ratio 6.04, copy #16; T1 cN0 stage IA clinical stage      03/21/2016 Genetic Testing    NEgative genetic testing on the comprehensive cancer panel and Negative genetic testing for the MSH2 inversion analysis ([place] inversion). The Comprehensive Cancer Panel offered by [facility] includes sequencing and/or deletion duplication testing of the following 32 genes: APC, ATM, AXIN2, BARD1, BMPR1A, BRCA1, BRCA2, BRIP1, CDH1, CDK4, CDKN2A, CHEK2, EPCAM, FANCC, MLH1, MSH2, MSH6, MUTYH, NBN, PALB2, PMS2, POLD1, POLE, PTEN, RAD51C, RAD51D, SCG5/GREM1, SMAD4, STK11, TP53, VHL, and XRCC2.   The report date is March 21, 2016.      07/27/2016 Surgery    Left lumpectomy: IDC grade 2, 2.6 cm, DCIS intermediate grade,0/3 lymph nodes negative, ER 60%, PR 0%, Ki-67 30%, HER-2 positive ratio 6.04, T2 N0 stage II a      09/01/2016 -  Chemotherapy    Abraxane Herceptin weekly 12 followed by Herceptin maintenance every 3 weeks       ALLERGIES:  is allergic to amoxicillin; ampicillin; and sulfa antibiotics.  Meds: Current Outpatient Prescriptions  Medication Sig Dispense Refill  . acetaminophen (TYLENOL) 500 MG tablet Take 1,000 mg by mouth every 6 (six) hours as needed for moderate pain or headache.     . albuterol (PROVENTIL HFA;VENTOLIN HFA) 108 (90 Base) MCG/ACT inhaler Inhale 2 puffs into the lungs every 6 (six) hours as needed for wheezing or shortness of breath. 1 Inhaler 2  . anastrozole (ARIMIDEX) 1 MG tablet Take 1 mg by mouth daily.  1  . Azelastine-Fluticasone 137-50 MCG/ACT  SUSP Place 1 spray into the nose 2 (two) times daily. (Patient not taking: Reported on 07/14/2016) 23 g 5  . cetirizine (ZYRTEC) 10 MG tablet Take 1 tablet (10 mg total) by mouth at bedtime. (Patient taking differently: Take 10 mg by mouth daily. ) 30 tablet 5  .  chlorpheniramine-HYDROcodone (TUSSIONEX PENNKINETIC ER) 10-8 MG/5ML SUER Take 5 mLs by mouth every 12 (twelve) hours as needed for cough. 150 mL 0  . fluticasone (FLONASE) 50 MCG/ACT nasal spray Place 2 sprays into both nostrils daily. (Patient taking differently: Place 2 sprays into both nostrils daily as needed for allergies. ) 16 g 5  . Fluticasone-Salmeterol (ADVAIR DISKUS) 250-50 MCG/DOSE AEPB Inhale 1 puff into the lungs 2 (two) times daily. 60 each 5  . glucose blood (BAYER CONTOUR TEST) test strip TEST twice a day    . insulin glargine (LANTUS) 100 UNIT/ML injection Inject 0.25 mLs (25 Units total) into the skin at bedtime. (Patient not taking: Reported on 07/14/2016) 10 mL 0  . lidocaine-prilocaine (EMLA) cream Apply to affected area once 30 g 3  . losartan (COZAAR) 100 MG tablet Take 1 tablet (100 mg total) by mouth daily. (Patient taking differently: Take 50 mg by mouth daily. ) 30 tablet 0  . metFORMIN (GLUCOPHAGE) 500 MG tablet Take 1 tablet (500 mg total) by mouth 2 (two) times daily with a meal. 60 tablet 2  . metoprolol tartrate (LOPRESSOR) 25 MG tablet Take 1 tablet (25 mg total) by mouth 2 (two) times daily. 60 tablet 2  . ondansetron (ZOFRAN) 8 MG tablet Take 1 tablet (8 mg total) by mouth 2 (two) times daily as needed for nausea or vomiting. 30 tablet 1  . OPSUMIT 10 MG TABS Take 10 mg by mouth daily.  8  . oxyCODONE (OXY IR/ROXICODONE) 5 MG immediate release tablet Take 1-2 tablets (5-10 mg total) by mouth every 4 (four) hours as needed for moderate pain. 30 tablet 0  . Polyethyl Glycol-Propyl Glycol (SYSTANE OP) Apply 1 drop to eye daily as needed (dry eyes).    . promethazine-codeine (PHENERGAN WITH CODEINE) 6.25-10 MG/5ML syrup Take 5 mLs by mouth every 4 (four) hours as needed for cough.   0  . sildenafil (REVATIO) 20 MG tablet Take 20 mg by mouth 3 (three) times daily.    Marland Kitchen spironolactone (ALDACTONE) 50 MG tablet Take 50 mg by mouth daily.     Marland Kitchen torsemide (DEMADEX) 20 MG  tablet Take 20 mg by mouth 2 (two) times daily.    Marland Kitchen UPTRAVI 200 MCG TABS Take 200 mcg by mouth 2 (two) times daily.  10   No current facility-administered medications for this encounter.    REVIEW OF SYSTEMS: A 10+ POINT REVIEW OF SYSTEMS WAS OBTAINED including neurology, dermatology, psychiatry, cardiac, respiratory, lymph, extremities, GI, GU, musculoskeletal, constitutional, reproductive, HEENT. All pertinent positives are noted in the HPI. All others are negative.   Physical Findings: Vitals:   11/09/16 0841  BP: 126/89  Pulse: 87  Temp: 98.3 F (36.8 C)  SpO2: 93%   General: Alert and oriented, in no acute distress. Heart: Regular in rate and rhythm with no murmurs, rubs, or gallops. Chest: Clear to auscultation bilaterally, with no rhonchi, wheezes, or rales. Neck: Neck is supple, no palpable cervical or supraclavicular lymphadenopathy. Breasts: Right breast has no mass or nipple discharge. Left breast is large and pendulous without mass or nipple discharge. There is a well-healed scar in the UIQ with some mild induration and a separate  scar in the axillary area that has also healed well.   Lab Findings: Lab Results  Component Value Date   WBC 6.5 11/03/2016   HGB 11.4 (L) 11/03/2016   HCT 34.8 11/03/2016   MCV 103.9 (H) 11/03/2016   PLT 161 11/03/2016    Radiographic Findings: No results found.  Impression:  [name] is a 51 y.o. woman with clinical stage I, pathological stage IIa T2 N0 invasive ductal carcinoma of the left breast, status post left breast lumpectomy. The patient would be a good candidate for breast conservation with radiation therapy directed at the left breast. We discussed the risks, benefits, and side effects of radiotherapy. We discussed that radiation would take approximately 6 weeks to complete. We spoke about acute effects including skin irritation and fatigue as well as much less common late effects including injury to internal organs of the  chest. We spoke about the latest technology that is used to minimize the risk of late effects for breast cancer patients undergoing radiotherapy. No guarantees of treatment were given. She is enthusiastic about proceeding with treatment. I look forward to participating in her care. We discussed and signed the radiation oncology consent form, and a copy was retained for our records.  Plan:  The patient will be scheduled to move forward with the simulation and planning process this week, and we anticipate starting radiotherapy next week. I will prescribe 28 treatments directed at the left breast followed by a boost to the lumpectomy cavity, an 5 additional treatments. The patient is not a candidate for hypofractionated radiation therapy given her large breast size.  I spent 30 minutes face to face with the patient and more than 50% of that time was spent in counseling and/or coordination of care.  ____________________________________  Blair Promise, PhD, MD  This document serves as a record of services personally performed by Gery Pray, MD. It was created on his behalf by Rae Lips, a trained medical scribe. The creation of this record is based on the scribe's personal observations and the provider's statements to them. This document has been checked and approved by the attending provider.

## 2016-11-10 ENCOUNTER — Other Ambulatory Visit: Payer: Self-pay

## 2016-11-10 ENCOUNTER — Ambulatory Visit: Payer: Self-pay

## 2016-11-11 ENCOUNTER — Ambulatory Visit
Admission: RE | Admit: 2016-11-11 | Discharge: 2016-11-11 | Disposition: A | Discharge status: Home / Self Care | Payer: BLUE CROSS/BLUE SHIELD | Source: Ambulatory Visit | Attending: Radiation Oncology | Admitting: Radiation Oncology

## 2016-11-11 DIAGNOSIS — Z880 Allergy status to penicillin: Secondary | ICD-10-CM | POA: Diagnosis not present

## 2016-11-11 DIAGNOSIS — Z7984 Long term (current) use of oral hypoglycemic drugs: Secondary | ICD-10-CM | POA: Diagnosis not present

## 2016-11-11 DIAGNOSIS — Z7951 Long term (current) use of inhaled steroids: Secondary | ICD-10-CM | POA: Diagnosis not present

## 2016-11-11 DIAGNOSIS — Z882 Allergy status to sulfonamides status: Secondary | ICD-10-CM | POA: Diagnosis not present

## 2016-11-11 DIAGNOSIS — Z170 Estrogen receptor positive status [ER+]: Principal | ICD-10-CM

## 2016-11-11 DIAGNOSIS — Z79899 Other long term (current) drug therapy: Secondary | ICD-10-CM | POA: Diagnosis not present

## 2016-11-11 DIAGNOSIS — C50212 Malignant neoplasm of upper-inner quadrant of left female breast: Secondary | ICD-10-CM | POA: Diagnosis not present

## 2016-11-11 DIAGNOSIS — Z79811 Long term (current) use of aromatase inhibitors: Secondary | ICD-10-CM | POA: Diagnosis not present

## 2016-11-11 DIAGNOSIS — Z510 Encounter for antineoplastic radiation therapy: Secondary | ICD-10-CM | POA: Diagnosis not present

## 2016-11-11 NOTE — Progress Notes (Signed)
  Radiation Oncology         [phone] ________________________________  Name: [name] MRN: [id number]  Date: 11/11/2016  DOB: [birth year]  SIMULATION AND TREATMENT PLANNING NOTE    ICD-10-CM   1. Malignant neoplasm of upper-inner quadrant of left breast in female, estrogen receptor positive ([place]) C50.212    Z17.0     DIAGNOSIS:  51 y.o. female with Clinical stage I LeftBreast UIQ Invasive Ductal Carcinoma, ER(+)/ PR(-) / Her2(+), Grade 3, Pathological stage IIa T2 N0  NARRATIVE:  The patient was brought to the [facility].  Identity was confirmed.  All relevant records and images related to the planned course of therapy were reviewed.  The patient freely provided informed written consent to proceed with treatment after reviewing the details related to the planned course of therapy. The consent form was witnessed and verified by the simulation staff.  Then, the patient was set-up in a stable reproducible  supine position for radiation therapy.  CT images were obtained.  Surface markings were placed.  The CT images were loaded into the planning software.  Then the target and avoidance structures were contoured.  Treatment planning then occurred.  The radiation prescription was entered and confirmed.  Then, I designed and supervised the construction of a total of 3 medically necessary complex treatment devices.  I have requested : 3D Simulation  I have requested a DVH of the following structures: Heart, lungs, lumpectomy cavity.  I have ordered:dose calc.  PLAN:  The patient will receive 50.4 Gy in 28 fractions followed by a boost to the lumpectomy cavity of 10 gray for a cumulative dose of 60.4 gray  -----------------------------------  Blair Promise, PhD, MD  This document serves as a record of services personally performed by Gery Pray, MD. It was created on his behalf by Arlyce Harman, a trained medical scribe. The creation of this record is based on the  scribe's personal observations and the provider's statements to them. This document has been checked and approved by the attending provider.

## 2016-11-16 DIAGNOSIS — C50212 Malignant neoplasm of upper-inner quadrant of left female breast: Secondary | ICD-10-CM | POA: Diagnosis not present

## 2016-11-16 DIAGNOSIS — Z7951 Long term (current) use of inhaled steroids: Secondary | ICD-10-CM | POA: Diagnosis not present

## 2016-11-16 DIAGNOSIS — Z79899 Other long term (current) drug therapy: Secondary | ICD-10-CM | POA: Diagnosis not present

## 2016-11-16 DIAGNOSIS — Z7984 Long term (current) use of oral hypoglycemic drugs: Secondary | ICD-10-CM | POA: Diagnosis not present

## 2016-11-16 DIAGNOSIS — Z882 Allergy status to sulfonamides status: Secondary | ICD-10-CM | POA: Diagnosis not present

## 2016-11-16 DIAGNOSIS — Z510 Encounter for antineoplastic radiation therapy: Secondary | ICD-10-CM | POA: Diagnosis not present

## 2016-11-16 DIAGNOSIS — Z170 Estrogen receptor positive status [ER+]: Secondary | ICD-10-CM | POA: Diagnosis not present

## 2016-11-16 DIAGNOSIS — Z79811 Long term (current) use of aromatase inhibitors: Secondary | ICD-10-CM | POA: Diagnosis not present

## 2016-11-16 DIAGNOSIS — Z880 Allergy status to penicillin: Secondary | ICD-10-CM | POA: Diagnosis not present

## 2016-11-17 ENCOUNTER — Other Ambulatory Visit: Payer: Self-pay

## 2016-11-17 ENCOUNTER — Ambulatory Visit: Payer: Self-pay

## 2016-11-17 ENCOUNTER — Ambulatory Visit: Payer: Self-pay | Admitting: Hematology and Oncology

## 2016-11-18 ENCOUNTER — Ambulatory Visit
Admission: RE | Admit: 2016-11-18 | Discharge: 2016-11-18 | Disposition: A | Discharge status: Home / Self Care | Payer: BLUE CROSS/BLUE SHIELD | Source: Ambulatory Visit | Attending: Radiation Oncology | Admitting: Radiation Oncology

## 2016-11-18 DIAGNOSIS — Z79899 Other long term (current) drug therapy: Secondary | ICD-10-CM | POA: Diagnosis not present

## 2016-11-18 DIAGNOSIS — Z170 Estrogen receptor positive status [ER+]: Secondary | ICD-10-CM | POA: Diagnosis not present

## 2016-11-18 DIAGNOSIS — Z7951 Long term (current) use of inhaled steroids: Secondary | ICD-10-CM | POA: Diagnosis not present

## 2016-11-18 DIAGNOSIS — Z880 Allergy status to penicillin: Secondary | ICD-10-CM | POA: Diagnosis not present

## 2016-11-18 DIAGNOSIS — Z7984 Long term (current) use of oral hypoglycemic drugs: Secondary | ICD-10-CM | POA: Diagnosis not present

## 2016-11-18 DIAGNOSIS — C50212 Malignant neoplasm of upper-inner quadrant of left female breast: Secondary | ICD-10-CM | POA: Diagnosis not present

## 2016-11-18 DIAGNOSIS — Z79811 Long term (current) use of aromatase inhibitors: Secondary | ICD-10-CM | POA: Diagnosis not present

## 2016-11-18 DIAGNOSIS — Z882 Allergy status to sulfonamides status: Secondary | ICD-10-CM | POA: Diagnosis not present

## 2016-11-18 DIAGNOSIS — Z510 Encounter for antineoplastic radiation therapy: Secondary | ICD-10-CM | POA: Diagnosis not present

## 2016-11-18 NOTE — Progress Notes (Signed)
  Radiation Oncology         [phone]) [phone] ________________________________  Name: [name] MRN: [id number]  Date: 11/18/2016  DOB: 23-Feb-1966  Simulation Verification Note    ICD-10-CM   1. Malignant neoplasm of upper-inner quadrant of left breast in female, estrogen receptor positive ([place]) C50.212    Z17.0     Status: outpatient  NARRATIVE: The patient was brought to the treatment unit and placed in the planned treatment position. The clinical setup was verified. Then port films were obtained and uploaded to the radiation oncology medical record software.  The treatment beams were carefully compared against the planned radiation fields. The position location and shape of the radiation fields was reviewed. They targeted volume of tissue appears to be appropriately covered by the radiation beams. Organs at risk appear to be excluded as planned.  Based on my personal review, I approved the simulation verification. The patient's treatment will proceed as planned.  -----------------------------------  Blair Promise, PhD, MD

## 2016-11-19 ENCOUNTER — Ambulatory Visit
Admission: RE | Admit: 2016-11-19 | Discharge: 2016-11-19 | Disposition: A | Discharge status: Home / Self Care | Payer: BLUE CROSS/BLUE SHIELD | Source: Ambulatory Visit | Attending: Radiation Oncology | Admitting: Radiation Oncology

## 2016-11-19 DIAGNOSIS — Z170 Estrogen receptor positive status [ER+]: Secondary | ICD-10-CM | POA: Diagnosis not present

## 2016-11-19 DIAGNOSIS — Z7984 Long term (current) use of oral hypoglycemic drugs: Secondary | ICD-10-CM | POA: Diagnosis not present

## 2016-11-19 DIAGNOSIS — Z882 Allergy status to sulfonamides status: Secondary | ICD-10-CM | POA: Diagnosis not present

## 2016-11-19 DIAGNOSIS — Z79811 Long term (current) use of aromatase inhibitors: Secondary | ICD-10-CM | POA: Diagnosis not present

## 2016-11-19 DIAGNOSIS — Z510 Encounter for antineoplastic radiation therapy: Secondary | ICD-10-CM | POA: Diagnosis not present

## 2016-11-19 DIAGNOSIS — C50212 Malignant neoplasm of upper-inner quadrant of left female breast: Secondary | ICD-10-CM | POA: Diagnosis not present

## 2016-11-19 DIAGNOSIS — Z7951 Long term (current) use of inhaled steroids: Secondary | ICD-10-CM | POA: Diagnosis not present

## 2016-11-19 DIAGNOSIS — Z880 Allergy status to penicillin: Secondary | ICD-10-CM | POA: Diagnosis not present

## 2016-11-19 DIAGNOSIS — Z79899 Other long term (current) drug therapy: Secondary | ICD-10-CM | POA: Diagnosis not present

## 2016-11-19 NOTE — Addendum Note (Signed)
Addendum  created 11/19/16 1350 by Roberts Gaudy, MD   Sign clinical note

## 2016-11-20 ENCOUNTER — Ambulatory Visit
Admission: RE | Admit: 2016-11-20 | Discharge: 2016-11-20 | Disposition: A | Discharge status: Home / Self Care | Payer: BLUE CROSS/BLUE SHIELD | Source: Ambulatory Visit | Attending: Radiation Oncology | Admitting: Radiation Oncology

## 2016-11-20 DIAGNOSIS — Z7951 Long term (current) use of inhaled steroids: Secondary | ICD-10-CM | POA: Diagnosis not present

## 2016-11-20 DIAGNOSIS — Z510 Encounter for antineoplastic radiation therapy: Secondary | ICD-10-CM | POA: Diagnosis not present

## 2016-11-20 DIAGNOSIS — Z7984 Long term (current) use of oral hypoglycemic drugs: Secondary | ICD-10-CM | POA: Diagnosis not present

## 2016-11-20 DIAGNOSIS — Z170 Estrogen receptor positive status [ER+]: Secondary | ICD-10-CM | POA: Diagnosis not present

## 2016-11-20 DIAGNOSIS — C50212 Malignant neoplasm of upper-inner quadrant of left female breast: Secondary | ICD-10-CM | POA: Diagnosis not present

## 2016-11-20 DIAGNOSIS — Z79899 Other long term (current) drug therapy: Secondary | ICD-10-CM | POA: Diagnosis not present

## 2016-11-20 DIAGNOSIS — Z880 Allergy status to penicillin: Secondary | ICD-10-CM | POA: Diagnosis not present

## 2016-11-20 DIAGNOSIS — Z79811 Long term (current) use of aromatase inhibitors: Secondary | ICD-10-CM | POA: Diagnosis not present

## 2016-11-20 DIAGNOSIS — Z882 Allergy status to sulfonamides status: Secondary | ICD-10-CM | POA: Diagnosis not present

## 2016-11-23 ENCOUNTER — Other Ambulatory Visit: Payer: Self-pay

## 2016-11-23 ENCOUNTER — Other Ambulatory Visit: Payer: Self-pay | Admitting: Hematology and Oncology

## 2016-11-23 ENCOUNTER — Ambulatory Visit: Payer: BLUE CROSS/BLUE SHIELD

## 2016-11-23 DIAGNOSIS — Z170 Estrogen receptor positive status [ER+]: Principal | ICD-10-CM

## 2016-11-23 DIAGNOSIS — C50212 Malignant neoplasm of upper-inner quadrant of left female breast: Secondary | ICD-10-CM

## 2016-11-23 MED ORDER — LIDOCAINE-PRILOCAINE 2.5-2.5 % EX CREA: TOPICAL_CREAM | CUTANEOUS | 3 refills | Status: DC

## 2016-11-24 ENCOUNTER — Ambulatory Visit
Admission: RE | Admit: 2016-11-24 | Discharge: 2016-11-24 | Disposition: A | Discharge status: Home / Self Care | Payer: BLUE CROSS/BLUE SHIELD | Source: Ambulatory Visit | Attending: Radiation Oncology | Admitting: Radiation Oncology

## 2016-11-24 ENCOUNTER — Other Ambulatory Visit (HOSPITAL_BASED_OUTPATIENT_CLINIC_OR_DEPARTMENT_OTHER): Payer: BLUE CROSS/BLUE SHIELD

## 2016-11-24 ENCOUNTER — Ambulatory Visit: Payer: BLUE CROSS/BLUE SHIELD

## 2016-11-24 ENCOUNTER — Ambulatory Visit (HOSPITAL_BASED_OUTPATIENT_CLINIC_OR_DEPARTMENT_OTHER): Payer: BLUE CROSS/BLUE SHIELD

## 2016-11-24 VITALS — BP 131/73 | HR 83 | Temp 97.6°F | Resp 18 | Wt 233.5 lb

## 2016-11-24 DIAGNOSIS — Z5189 Encounter for other specified aftercare: Secondary | ICD-10-CM | POA: Diagnosis not present

## 2016-11-24 DIAGNOSIS — Z510 Encounter for antineoplastic radiation therapy: Secondary | ICD-10-CM | POA: Diagnosis not present

## 2016-11-24 DIAGNOSIS — C50212 Malignant neoplasm of upper-inner quadrant of left female breast: Secondary | ICD-10-CM

## 2016-11-24 DIAGNOSIS — Z79899 Other long term (current) drug therapy: Secondary | ICD-10-CM | POA: Diagnosis not present

## 2016-11-24 DIAGNOSIS — Z880 Allergy status to penicillin: Secondary | ICD-10-CM | POA: Diagnosis not present

## 2016-11-24 DIAGNOSIS — Z95828 Presence of other vascular implants and grafts: Secondary | ICD-10-CM

## 2016-11-24 DIAGNOSIS — Z5112 Encounter for antineoplastic immunotherapy: Secondary | ICD-10-CM | POA: Diagnosis not present

## 2016-11-24 DIAGNOSIS — D649 Anemia, unspecified: Secondary | ICD-10-CM

## 2016-11-24 DIAGNOSIS — Z170 Estrogen receptor positive status [ER+]: Principal | ICD-10-CM

## 2016-11-24 DIAGNOSIS — D259 Leiomyoma of uterus, unspecified: Secondary | ICD-10-CM

## 2016-11-24 DIAGNOSIS — Z7984 Long term (current) use of oral hypoglycemic drugs: Secondary | ICD-10-CM | POA: Diagnosis not present

## 2016-11-24 DIAGNOSIS — Z882 Allergy status to sulfonamides status: Secondary | ICD-10-CM | POA: Diagnosis not present

## 2016-11-24 DIAGNOSIS — Z79811 Long term (current) use of aromatase inhibitors: Secondary | ICD-10-CM | POA: Diagnosis not present

## 2016-11-24 DIAGNOSIS — Z7951 Long term (current) use of inhaled steroids: Secondary | ICD-10-CM | POA: Diagnosis not present

## 2016-11-24 LAB — CBC WITH DIFFERENTIAL/PLATELET
BASO%: 0.6 % (ref 0.0–2.0)
Basophils Absolute: 0 10*3/uL (ref 0.0–0.1)
EOS%: 4.5 % (ref 0.0–7.0)
Eosinophils Absolute: 0.2 10*3/uL (ref 0.0–0.5)
HCT: 38.8 % (ref 34.8–46.6)
HEMOGLOBIN: 12.8 g/dL (ref 11.6–15.9)
LYMPH%: 22.2 % (ref 14.0–49.7)
MCH: 33.8 pg (ref 25.1–34.0)
MCHC: 33 g/dL (ref 31.5–36.0)
MCV: 102.4 fL — ABNORMAL HIGH (ref 79.5–101.0)
MONO#: 0.5 10*3/uL (ref 0.1–0.9)
MONO%: 10.4 % (ref 0.0–14.0)
NEUT%: 62.3 % (ref 38.4–76.8)
NEUTROS ABS: 3.1 10*3/uL (ref 1.5–6.5)
Platelets: 152 10*3/uL (ref 145–400)
RBC: 3.79 10*6/uL (ref 3.70–5.45)
RDW: 14.5 % (ref 11.2–14.5)
WBC: 4.9 10*3/uL (ref 3.9–10.3)
lymph#: 1.1 10*3/uL (ref 0.9–3.3)

## 2016-11-24 LAB — COMPREHENSIVE METABOLIC PANEL
ALBUMIN: 3.3 g/dL — AB (ref 3.5–5.0)
ALT: 14 U/L (ref 0–55)
AST: 18 U/L (ref 5–34)
Alkaline Phosphatase: 88 U/L (ref 40–150)
Anion Gap: 8 mEq/L (ref 3–11)
BUN: 28.3 mg/dL — AB (ref 7.0–26.0)
CO2: 27 mEq/L (ref 22–29)
CREATININE: 1.4 mg/dL — AB (ref 0.6–1.1)
Calcium: 9.3 mg/dL (ref 8.4–10.4)
Chloride: 101 mEq/L (ref 98–109)
EGFR: 50 mL/min/{1.73_m2} — ABNORMAL LOW (ref >90)
GLUCOSE: 125 mg/dL (ref 70–140)
Potassium: 3.9 mEq/L (ref 3.5–5.1)
SODIUM: 137 meq/L (ref 136–145)
TOTAL PROTEIN: 7.5 g/dL (ref 6.4–8.3)
Total Bilirubin: 0.68 mg/dL (ref 0.20–1.20)

## 2016-11-24 MED ORDER — HEPARIN SOD (PORK) LOCK FLUSH 100 UNIT/ML IV SOLN
500.0000 [IU] | Freq: Once | INTRAVENOUS | Status: AC | PRN
  Administered 2016-11-24: 500 [IU]
  Filled 2016-11-24: qty 5

## 2016-11-24 MED ORDER — ALRA NON-METALLIC DEODORANT (RAD-ONC)
1.0000 | Freq: Once | TOPICAL | Status: AC
Start: 2016-11-24 — End: 2016-11-24
  Administered 2016-11-24: 1 via TOPICAL

## 2016-11-24 MED ORDER — ACETAMINOPHEN 325 MG PO TABS
ORAL_TABLET | ORAL | Status: AC
  Filled 2016-11-24: qty 2

## 2016-11-24 MED ORDER — DIPHENHYDRAMINE HCL 25 MG PO CAPS
ORAL_CAPSULE | ORAL | Status: AC
  Filled 2016-11-24: qty 2

## 2016-11-24 MED ORDER — FAMOTIDINE IN NACL 20-0.9 MG/50ML-% IV SOLN
20.0000 mg | Freq: Once | INTRAVENOUS | Status: AC
  Administered 2016-11-24: 20 mg via INTRAVENOUS

## 2016-11-24 MED ORDER — ALTEPLASE 2 MG IJ SOLR
2.0000 mg | Freq: Once | INTRAMUSCULAR | Status: AC
  Administered 2016-11-24: 2 mg
  Filled 2016-11-24: qty 2

## 2016-11-24 MED ORDER — SODIUM CHLORIDE 0.9% FLUSH
10.0000 mL | Freq: Once | INTRAVENOUS | Status: AC
  Administered 2016-11-24: 10 mL
  Filled 2016-11-24: qty 10

## 2016-11-24 MED ORDER — SODIUM CHLORIDE 0.9% FLUSH
3.0000 mL | INTRAVENOUS | Status: DC | PRN
  Filled 2016-11-24: qty 10

## 2016-11-24 MED ORDER — ALTEPLASE 2 MG IJ SOLR
2.0000 mg | Freq: Once | INTRAMUSCULAR | Status: DC | PRN
  Filled 2016-11-24: qty 2

## 2016-11-24 MED ORDER — ACETAMINOPHEN 325 MG PO TABS
650.0000 mg | ORAL_TABLET | Freq: Once | ORAL | Status: AC
  Administered 2016-11-24: 650 mg via ORAL

## 2016-11-24 MED ORDER — SODIUM CHLORIDE 0.9 % IV SOLN
Freq: Once | INTRAVENOUS | Status: AC
  Administered 2016-11-24: 13:00:00 via INTRAVENOUS

## 2016-11-24 MED ORDER — SODIUM CHLORIDE 0.9% FLUSH
10.0000 mL | INTRAVENOUS | Status: DC | PRN
  Administered 2016-11-24: 10 mL
  Filled 2016-11-24: qty 10

## 2016-11-24 MED ORDER — SODIUM CHLORIDE 0.9 % IV SOLN
8.0000 mg/kg | Freq: Once | INTRAVENOUS | Status: AC
  Administered 2016-11-24: 840 mg via INTRAVENOUS
  Filled 2016-11-24: qty 40

## 2016-11-24 MED ORDER — RADIAPLEXRX EX GEL
Freq: Once | CUTANEOUS | Status: AC
  Administered 2016-11-24: 17:00:00 via TOPICAL

## 2016-11-24 MED ORDER — FAMOTIDINE IN NACL 20-0.9 MG/50ML-% IV SOLN
INTRAVENOUS | Status: AC
  Filled 2016-11-24: qty 50

## 2016-11-24 MED ORDER — DIPHENHYDRAMINE HCL 25 MG PO CAPS
50.0000 mg | ORAL_CAPSULE | Freq: Once | ORAL | Status: AC
  Administered 2016-11-24: 50 mg via ORAL

## 2016-11-24 NOTE — Addendum Note (Signed)
Encounter addended by: Jacqulyn Liner, RN on: 11/24/2016  4:50 PM<BR>    Actions taken: [facility] administration accepted

## 2016-11-24 NOTE — Progress Notes (Signed)
Pt creatinine 1.4 today, pt stated she has not been drinking "as much" lately. Educated pt on importance of staying hydrated and increasing fluid intake. Pt verbalized understanding.

## 2016-11-24 NOTE — Patient Instructions (Signed)
[facility] Discharge Instructions for Patients Receiving Chemotherapy  Today you received the following chemotherapy agents: Herceptin   To help prevent nausea and vomiting after your treatment, we encourage you to take your nausea medication as directed.    If you develop nausea and vomiting that is not controlled by your nausea medication, call the clinic.   BELOW ARE SYMPTOMS THAT SHOULD BE REPORTED IMMEDIATELY:  *FEVER GREATER THAN 100.5 F  *CHILLS WITH OR WITHOUT FEVER  NAUSEA AND VOMITING THAT IS NOT CONTROLLED WITH YOUR NAUSEA MEDICATION  *UNUSUAL SHORTNESS OF BREATH  *UNUSUAL BRUISING OR BLEEDING  TENDERNESS IN MOUTH AND THROAT WITH OR WITHOUT PRESENCE OF ULCERS  *URINARY PROBLEMS  *BOWEL PROBLEMS  UNUSUAL RASH Items with * indicate a potential emergency and should be followed up as soon as possible.  Feel free to call the clinic you have any questions or concerns. The clinic phone number is [phone].  Please show the CHEMO ALERT CARD at check-in to the Emergency Department and triage nurse.   

## 2016-11-24 NOTE — Progress Notes (Signed)
Pt here for patient teaching.  Pt given Radiation and You booklet, skin care instructions, Alra deodorant and Radiaplex gel.  Reviewed areas of pertinence such as fatigue and skin changes . Pt able to give teach back of to pat skin and use unscented/gentle soap,apply Radiaplex bid, avoid applying anything to skin within 4 hours of treatment and avoid wearing an under wire bra. Pt demonstrated understanding and verbalizes understanding of information given and will contact nursing with any questions or concerns.

## 2016-11-24 NOTE — Progress Notes (Unsigned)
Port flushed several times and repositioned with no blood return. Labs will be drawn peripherally. Cathflow will be released. Infusion called talked to RN Stanton Kidney) and RN will administer. Judeth Gilles LPN

## 2016-11-25 ENCOUNTER — Ambulatory Visit
Admission: RE | Admit: 2016-11-25 | Discharge: 2016-11-25 | Disposition: A | Discharge status: Home / Self Care | Payer: BLUE CROSS/BLUE SHIELD | Source: Ambulatory Visit | Attending: Radiation Oncology | Admitting: Radiation Oncology

## 2016-11-25 DIAGNOSIS — C50212 Malignant neoplasm of upper-inner quadrant of left female breast: Secondary | ICD-10-CM | POA: Diagnosis not present

## 2016-11-25 DIAGNOSIS — Z880 Allergy status to penicillin: Secondary | ICD-10-CM | POA: Diagnosis not present

## 2016-11-25 DIAGNOSIS — Z170 Estrogen receptor positive status [ER+]: Secondary | ICD-10-CM | POA: Diagnosis not present

## 2016-11-25 DIAGNOSIS — Z79899 Other long term (current) drug therapy: Secondary | ICD-10-CM | POA: Diagnosis not present

## 2016-11-25 DIAGNOSIS — Z7984 Long term (current) use of oral hypoglycemic drugs: Secondary | ICD-10-CM | POA: Diagnosis not present

## 2016-11-25 DIAGNOSIS — Z7951 Long term (current) use of inhaled steroids: Secondary | ICD-10-CM | POA: Diagnosis not present

## 2016-11-25 DIAGNOSIS — Z510 Encounter for antineoplastic radiation therapy: Secondary | ICD-10-CM | POA: Diagnosis not present

## 2016-11-25 DIAGNOSIS — Z882 Allergy status to sulfonamides status: Secondary | ICD-10-CM | POA: Diagnosis not present

## 2016-11-25 DIAGNOSIS — Z79811 Long term (current) use of aromatase inhibitors: Secondary | ICD-10-CM | POA: Diagnosis not present

## 2016-11-26 ENCOUNTER — Ambulatory Visit
Admission: RE | Admit: 2016-11-26 | Discharge: 2016-11-26 | Disposition: A | Discharge status: Home / Self Care | Payer: BLUE CROSS/BLUE SHIELD | Source: Ambulatory Visit | Attending: Radiation Oncology | Admitting: Radiation Oncology

## 2016-11-26 DIAGNOSIS — G4733 Obstructive sleep apnea (adult) (pediatric): Secondary | ICD-10-CM | POA: Diagnosis not present

## 2016-11-26 DIAGNOSIS — Z79811 Long term (current) use of aromatase inhibitors: Secondary | ICD-10-CM | POA: Diagnosis not present

## 2016-11-26 DIAGNOSIS — Z7951 Long term (current) use of inhaled steroids: Secondary | ICD-10-CM | POA: Diagnosis not present

## 2016-11-26 DIAGNOSIS — Z170 Estrogen receptor positive status [ER+]: Secondary | ICD-10-CM | POA: Diagnosis not present

## 2016-11-26 DIAGNOSIS — Z510 Encounter for antineoplastic radiation therapy: Secondary | ICD-10-CM | POA: Diagnosis not present

## 2016-11-26 DIAGNOSIS — C50212 Malignant neoplasm of upper-inner quadrant of left female breast: Secondary | ICD-10-CM | POA: Diagnosis not present

## 2016-11-26 DIAGNOSIS — Z882 Allergy status to sulfonamides status: Secondary | ICD-10-CM | POA: Diagnosis not present

## 2016-11-26 DIAGNOSIS — Z880 Allergy status to penicillin: Secondary | ICD-10-CM | POA: Diagnosis not present

## 2016-11-26 DIAGNOSIS — Z7984 Long term (current) use of oral hypoglycemic drugs: Secondary | ICD-10-CM | POA: Diagnosis not present

## 2016-11-26 DIAGNOSIS — Z79899 Other long term (current) drug therapy: Secondary | ICD-10-CM | POA: Diagnosis not present

## 2016-11-27 ENCOUNTER — Ambulatory Visit
Admission: RE | Admit: 2016-11-27 | Discharge: 2016-11-27 | Disposition: A | Discharge status: Home / Self Care | Payer: BLUE CROSS/BLUE SHIELD | Source: Ambulatory Visit | Attending: Radiation Oncology | Admitting: Radiation Oncology

## 2016-11-27 DIAGNOSIS — Z510 Encounter for antineoplastic radiation therapy: Secondary | ICD-10-CM | POA: Diagnosis not present

## 2016-11-27 DIAGNOSIS — C50212 Malignant neoplasm of upper-inner quadrant of left female breast: Secondary | ICD-10-CM | POA: Diagnosis not present

## 2016-11-27 DIAGNOSIS — Z79811 Long term (current) use of aromatase inhibitors: Secondary | ICD-10-CM | POA: Diagnosis not present

## 2016-11-27 DIAGNOSIS — Z170 Estrogen receptor positive status [ER+]: Secondary | ICD-10-CM | POA: Diagnosis not present

## 2016-11-27 DIAGNOSIS — Z79899 Other long term (current) drug therapy: Secondary | ICD-10-CM | POA: Diagnosis not present

## 2016-11-27 DIAGNOSIS — Z7951 Long term (current) use of inhaled steroids: Secondary | ICD-10-CM | POA: Diagnosis not present

## 2016-11-27 DIAGNOSIS — Z882 Allergy status to sulfonamides status: Secondary | ICD-10-CM | POA: Diagnosis not present

## 2016-11-27 DIAGNOSIS — Z7984 Long term (current) use of oral hypoglycemic drugs: Secondary | ICD-10-CM | POA: Diagnosis not present

## 2016-11-27 DIAGNOSIS — Z880 Allergy status to penicillin: Secondary | ICD-10-CM | POA: Diagnosis not present

## 2016-11-28 ENCOUNTER — Other Ambulatory Visit: Payer: Self-pay | Admitting: Nurse Practitioner

## 2016-12-01 ENCOUNTER — Ambulatory Visit
Admission: RE | Admit: 2016-12-01 | Discharge: 2016-12-01 | Disposition: A | Discharge status: Home / Self Care | Payer: BLUE CROSS/BLUE SHIELD | Source: Ambulatory Visit | Attending: Radiation Oncology | Admitting: Radiation Oncology

## 2016-12-01 DIAGNOSIS — C50212 Malignant neoplasm of upper-inner quadrant of left female breast: Secondary | ICD-10-CM | POA: Diagnosis not present

## 2016-12-01 DIAGNOSIS — Z79899 Other long term (current) drug therapy: Secondary | ICD-10-CM | POA: Diagnosis not present

## 2016-12-01 DIAGNOSIS — Z882 Allergy status to sulfonamides status: Secondary | ICD-10-CM | POA: Diagnosis not present

## 2016-12-01 DIAGNOSIS — Z170 Estrogen receptor positive status [ER+]: Secondary | ICD-10-CM | POA: Diagnosis not present

## 2016-12-01 DIAGNOSIS — Z7984 Long term (current) use of oral hypoglycemic drugs: Secondary | ICD-10-CM | POA: Diagnosis not present

## 2016-12-01 DIAGNOSIS — Z510 Encounter for antineoplastic radiation therapy: Secondary | ICD-10-CM | POA: Diagnosis not present

## 2016-12-01 DIAGNOSIS — Z7951 Long term (current) use of inhaled steroids: Secondary | ICD-10-CM | POA: Diagnosis not present

## 2016-12-01 DIAGNOSIS — Z880 Allergy status to penicillin: Secondary | ICD-10-CM | POA: Diagnosis not present

## 2016-12-01 DIAGNOSIS — Z79811 Long term (current) use of aromatase inhibitors: Secondary | ICD-10-CM | POA: Diagnosis not present

## 2016-12-02 ENCOUNTER — Ambulatory Visit
Admission: RE | Admit: 2016-12-02 | Discharge: 2016-12-02 | Disposition: A | Discharge status: Home / Self Care | Payer: BLUE CROSS/BLUE SHIELD | Source: Ambulatory Visit | Attending: Radiation Oncology | Admitting: Radiation Oncology

## 2016-12-02 DIAGNOSIS — Z880 Allergy status to penicillin: Secondary | ICD-10-CM | POA: Diagnosis not present

## 2016-12-02 DIAGNOSIS — Z510 Encounter for antineoplastic radiation therapy: Secondary | ICD-10-CM | POA: Diagnosis not present

## 2016-12-02 DIAGNOSIS — Z7984 Long term (current) use of oral hypoglycemic drugs: Secondary | ICD-10-CM | POA: Diagnosis not present

## 2016-12-02 DIAGNOSIS — Z170 Estrogen receptor positive status [ER+]: Secondary | ICD-10-CM | POA: Diagnosis not present

## 2016-12-02 DIAGNOSIS — Z79811 Long term (current) use of aromatase inhibitors: Secondary | ICD-10-CM | POA: Diagnosis not present

## 2016-12-02 DIAGNOSIS — Z79899 Other long term (current) drug therapy: Secondary | ICD-10-CM | POA: Diagnosis not present

## 2016-12-02 DIAGNOSIS — Z882 Allergy status to sulfonamides status: Secondary | ICD-10-CM | POA: Diagnosis not present

## 2016-12-02 DIAGNOSIS — Z7951 Long term (current) use of inhaled steroids: Secondary | ICD-10-CM | POA: Diagnosis not present

## 2016-12-02 DIAGNOSIS — C50212 Malignant neoplasm of upper-inner quadrant of left female breast: Secondary | ICD-10-CM | POA: Diagnosis not present

## 2016-12-03 ENCOUNTER — Ambulatory Visit
Admission: RE | Admit: 2016-12-03 | Discharge: 2016-12-03 | Disposition: A | Discharge status: Home / Self Care | Payer: BLUE CROSS/BLUE SHIELD | Source: Ambulatory Visit | Attending: Radiation Oncology | Admitting: Radiation Oncology

## 2016-12-03 DIAGNOSIS — C50212 Malignant neoplasm of upper-inner quadrant of left female breast: Secondary | ICD-10-CM | POA: Diagnosis not present

## 2016-12-03 DIAGNOSIS — Z882 Allergy status to sulfonamides status: Secondary | ICD-10-CM | POA: Diagnosis not present

## 2016-12-03 DIAGNOSIS — Z170 Estrogen receptor positive status [ER+]: Secondary | ICD-10-CM | POA: Diagnosis not present

## 2016-12-03 DIAGNOSIS — Z79899 Other long term (current) drug therapy: Secondary | ICD-10-CM | POA: Diagnosis not present

## 2016-12-03 DIAGNOSIS — Z510 Encounter for antineoplastic radiation therapy: Secondary | ICD-10-CM | POA: Diagnosis not present

## 2016-12-03 DIAGNOSIS — Z880 Allergy status to penicillin: Secondary | ICD-10-CM | POA: Diagnosis not present

## 2016-12-03 DIAGNOSIS — Z79811 Long term (current) use of aromatase inhibitors: Secondary | ICD-10-CM | POA: Diagnosis not present

## 2016-12-03 DIAGNOSIS — Z7984 Long term (current) use of oral hypoglycemic drugs: Secondary | ICD-10-CM | POA: Diagnosis not present

## 2016-12-03 DIAGNOSIS — Z7951 Long term (current) use of inhaled steroids: Secondary | ICD-10-CM | POA: Diagnosis not present

## 2016-12-04 ENCOUNTER — Ambulatory Visit
Admission: RE | Admit: 2016-12-04 | Discharge: 2016-12-04 | Disposition: A | Discharge status: Home / Self Care | Payer: BLUE CROSS/BLUE SHIELD | Source: Ambulatory Visit | Attending: Radiation Oncology | Admitting: Radiation Oncology

## 2016-12-04 DIAGNOSIS — Z7984 Long term (current) use of oral hypoglycemic drugs: Secondary | ICD-10-CM | POA: Diagnosis not present

## 2016-12-04 DIAGNOSIS — Z882 Allergy status to sulfonamides status: Secondary | ICD-10-CM | POA: Diagnosis not present

## 2016-12-04 DIAGNOSIS — C50212 Malignant neoplasm of upper-inner quadrant of left female breast: Secondary | ICD-10-CM | POA: Diagnosis not present

## 2016-12-04 DIAGNOSIS — Z79811 Long term (current) use of aromatase inhibitors: Secondary | ICD-10-CM | POA: Diagnosis not present

## 2016-12-04 DIAGNOSIS — Z7951 Long term (current) use of inhaled steroids: Secondary | ICD-10-CM | POA: Diagnosis not present

## 2016-12-04 DIAGNOSIS — Z880 Allergy status to penicillin: Secondary | ICD-10-CM | POA: Diagnosis not present

## 2016-12-04 DIAGNOSIS — Z510 Encounter for antineoplastic radiation therapy: Secondary | ICD-10-CM | POA: Diagnosis not present

## 2016-12-04 DIAGNOSIS — Z79899 Other long term (current) drug therapy: Secondary | ICD-10-CM | POA: Diagnosis not present

## 2016-12-04 DIAGNOSIS — Z170 Estrogen receptor positive status [ER+]: Secondary | ICD-10-CM | POA: Diagnosis not present

## 2016-12-07 ENCOUNTER — Ambulatory Visit
Admission: RE | Admit: 2016-12-07 | Discharge: 2016-12-07 | Disposition: A | Discharge status: Home / Self Care | Payer: BLUE CROSS/BLUE SHIELD | Source: Ambulatory Visit | Attending: Radiation Oncology | Admitting: Radiation Oncology

## 2016-12-07 DIAGNOSIS — Z79811 Long term (current) use of aromatase inhibitors: Secondary | ICD-10-CM | POA: Diagnosis not present

## 2016-12-07 DIAGNOSIS — Z7951 Long term (current) use of inhaled steroids: Secondary | ICD-10-CM | POA: Diagnosis not present

## 2016-12-07 DIAGNOSIS — C50212 Malignant neoplasm of upper-inner quadrant of left female breast: Secondary | ICD-10-CM | POA: Diagnosis not present

## 2016-12-07 DIAGNOSIS — Z79899 Other long term (current) drug therapy: Secondary | ICD-10-CM | POA: Diagnosis not present

## 2016-12-07 DIAGNOSIS — Z880 Allergy status to penicillin: Secondary | ICD-10-CM | POA: Diagnosis not present

## 2016-12-07 DIAGNOSIS — Z882 Allergy status to sulfonamides status: Secondary | ICD-10-CM | POA: Diagnosis not present

## 2016-12-07 DIAGNOSIS — Z7984 Long term (current) use of oral hypoglycemic drugs: Secondary | ICD-10-CM | POA: Diagnosis not present

## 2016-12-07 DIAGNOSIS — Z170 Estrogen receptor positive status [ER+]: Secondary | ICD-10-CM | POA: Diagnosis not present

## 2016-12-07 DIAGNOSIS — Z510 Encounter for antineoplastic radiation therapy: Secondary | ICD-10-CM | POA: Diagnosis not present

## 2016-12-08 ENCOUNTER — Ambulatory Visit
Admission: RE | Admit: 2016-12-08 | Discharge: 2016-12-08 | Disposition: A | Discharge status: Home / Self Care | Payer: BLUE CROSS/BLUE SHIELD | Source: Ambulatory Visit | Attending: Radiation Oncology | Admitting: Radiation Oncology

## 2016-12-08 DIAGNOSIS — Z510 Encounter for antineoplastic radiation therapy: Secondary | ICD-10-CM | POA: Diagnosis not present

## 2016-12-08 DIAGNOSIS — Z882 Allergy status to sulfonamides status: Secondary | ICD-10-CM | POA: Diagnosis not present

## 2016-12-08 DIAGNOSIS — Z79811 Long term (current) use of aromatase inhibitors: Secondary | ICD-10-CM | POA: Diagnosis not present

## 2016-12-08 DIAGNOSIS — C50212 Malignant neoplasm of upper-inner quadrant of left female breast: Secondary | ICD-10-CM | POA: Diagnosis not present

## 2016-12-08 DIAGNOSIS — Z7951 Long term (current) use of inhaled steroids: Secondary | ICD-10-CM | POA: Diagnosis not present

## 2016-12-08 DIAGNOSIS — Z7984 Long term (current) use of oral hypoglycemic drugs: Secondary | ICD-10-CM | POA: Diagnosis not present

## 2016-12-08 DIAGNOSIS — Z170 Estrogen receptor positive status [ER+]: Secondary | ICD-10-CM | POA: Diagnosis not present

## 2016-12-08 DIAGNOSIS — Z79899 Other long term (current) drug therapy: Secondary | ICD-10-CM | POA: Diagnosis not present

## 2016-12-08 DIAGNOSIS — Z880 Allergy status to penicillin: Secondary | ICD-10-CM | POA: Diagnosis not present

## 2016-12-09 ENCOUNTER — Ambulatory Visit
Admission: RE | Admit: 2016-12-09 | Discharge: 2016-12-09 | Disposition: A | Discharge status: Home / Self Care | Payer: BLUE CROSS/BLUE SHIELD | Source: Ambulatory Visit | Attending: Radiation Oncology | Admitting: Radiation Oncology

## 2016-12-09 DIAGNOSIS — Z7984 Long term (current) use of oral hypoglycemic drugs: Secondary | ICD-10-CM | POA: Diagnosis not present

## 2016-12-09 DIAGNOSIS — C50212 Malignant neoplasm of upper-inner quadrant of left female breast: Secondary | ICD-10-CM | POA: Diagnosis not present

## 2016-12-09 DIAGNOSIS — Z882 Allergy status to sulfonamides status: Secondary | ICD-10-CM | POA: Diagnosis not present

## 2016-12-09 DIAGNOSIS — Z7951 Long term (current) use of inhaled steroids: Secondary | ICD-10-CM | POA: Diagnosis not present

## 2016-12-09 DIAGNOSIS — Z79811 Long term (current) use of aromatase inhibitors: Secondary | ICD-10-CM | POA: Diagnosis not present

## 2016-12-09 DIAGNOSIS — Z170 Estrogen receptor positive status [ER+]: Secondary | ICD-10-CM | POA: Diagnosis not present

## 2016-12-09 DIAGNOSIS — Z880 Allergy status to penicillin: Secondary | ICD-10-CM | POA: Diagnosis not present

## 2016-12-09 DIAGNOSIS — Z79899 Other long term (current) drug therapy: Secondary | ICD-10-CM | POA: Diagnosis not present

## 2016-12-09 DIAGNOSIS — Z510 Encounter for antineoplastic radiation therapy: Secondary | ICD-10-CM | POA: Diagnosis not present

## 2016-12-10 ENCOUNTER — Ambulatory Visit
Admission: RE | Admit: 2016-12-10 | Discharge: 2016-12-10 | Disposition: A | Discharge status: Home / Self Care | Payer: BLUE CROSS/BLUE SHIELD | Source: Ambulatory Visit | Attending: Radiation Oncology | Admitting: Radiation Oncology

## 2016-12-10 DIAGNOSIS — Z510 Encounter for antineoplastic radiation therapy: Secondary | ICD-10-CM | POA: Diagnosis not present

## 2016-12-10 DIAGNOSIS — Z170 Estrogen receptor positive status [ER+]: Secondary | ICD-10-CM | POA: Diagnosis not present

## 2016-12-10 DIAGNOSIS — Z79899 Other long term (current) drug therapy: Secondary | ICD-10-CM | POA: Diagnosis not present

## 2016-12-10 DIAGNOSIS — Z7951 Long term (current) use of inhaled steroids: Secondary | ICD-10-CM | POA: Diagnosis not present

## 2016-12-10 DIAGNOSIS — Z7984 Long term (current) use of oral hypoglycemic drugs: Secondary | ICD-10-CM | POA: Diagnosis not present

## 2016-12-10 DIAGNOSIS — Z882 Allergy status to sulfonamides status: Secondary | ICD-10-CM | POA: Diagnosis not present

## 2016-12-10 DIAGNOSIS — C50212 Malignant neoplasm of upper-inner quadrant of left female breast: Secondary | ICD-10-CM | POA: Diagnosis not present

## 2016-12-10 DIAGNOSIS — Z880 Allergy status to penicillin: Secondary | ICD-10-CM | POA: Diagnosis not present

## 2016-12-10 DIAGNOSIS — Z79811 Long term (current) use of aromatase inhibitors: Secondary | ICD-10-CM | POA: Diagnosis not present

## 2016-12-11 ENCOUNTER — Ambulatory Visit
Admission: RE | Admit: 2016-12-11 | Discharge: 2016-12-11 | Disposition: A | Discharge status: Home / Self Care | Payer: BLUE CROSS/BLUE SHIELD | Source: Ambulatory Visit | Attending: Radiation Oncology | Admitting: Radiation Oncology

## 2016-12-11 DIAGNOSIS — C50212 Malignant neoplasm of upper-inner quadrant of left female breast: Secondary | ICD-10-CM | POA: Diagnosis not present

## 2016-12-11 DIAGNOSIS — Z882 Allergy status to sulfonamides status: Secondary | ICD-10-CM | POA: Diagnosis not present

## 2016-12-11 DIAGNOSIS — Z170 Estrogen receptor positive status [ER+]: Secondary | ICD-10-CM | POA: Diagnosis not present

## 2016-12-11 DIAGNOSIS — Z7951 Long term (current) use of inhaled steroids: Secondary | ICD-10-CM | POA: Diagnosis not present

## 2016-12-11 DIAGNOSIS — Z880 Allergy status to penicillin: Secondary | ICD-10-CM | POA: Diagnosis not present

## 2016-12-11 DIAGNOSIS — Z79811 Long term (current) use of aromatase inhibitors: Secondary | ICD-10-CM | POA: Diagnosis not present

## 2016-12-11 DIAGNOSIS — Z7984 Long term (current) use of oral hypoglycemic drugs: Secondary | ICD-10-CM | POA: Diagnosis not present

## 2016-12-11 DIAGNOSIS — Z79899 Other long term (current) drug therapy: Secondary | ICD-10-CM | POA: Diagnosis not present

## 2016-12-11 DIAGNOSIS — Z510 Encounter for antineoplastic radiation therapy: Secondary | ICD-10-CM | POA: Diagnosis not present

## 2016-12-14 ENCOUNTER — Ambulatory Visit: Payer: BLUE CROSS/BLUE SHIELD

## 2016-12-15 ENCOUNTER — Encounter: Payer: Self-pay | Admitting: Oncology

## 2016-12-15 ENCOUNTER — Ambulatory Visit (HOSPITAL_BASED_OUTPATIENT_CLINIC_OR_DEPARTMENT_OTHER): Payer: BLUE CROSS/BLUE SHIELD

## 2016-12-15 ENCOUNTER — Ambulatory Visit
Admission: RE | Admit: 2016-12-15 | Discharge: 2016-12-15 | Disposition: A | Discharge status: Home / Self Care | Payer: BLUE CROSS/BLUE SHIELD | Source: Ambulatory Visit | Attending: Radiation Oncology | Admitting: Radiation Oncology

## 2016-12-15 VITALS — BP 124/78 | HR 88 | Temp 98.6°F | Resp 18

## 2016-12-15 DIAGNOSIS — Z170 Estrogen receptor positive status [ER+]: Secondary | ICD-10-CM | POA: Diagnosis not present

## 2016-12-15 DIAGNOSIS — Z79899 Other long term (current) drug therapy: Secondary | ICD-10-CM | POA: Diagnosis not present

## 2016-12-15 DIAGNOSIS — Z5112 Encounter for antineoplastic immunotherapy: Secondary | ICD-10-CM | POA: Diagnosis not present

## 2016-12-15 DIAGNOSIS — Z7951 Long term (current) use of inhaled steroids: Secondary | ICD-10-CM | POA: Diagnosis not present

## 2016-12-15 DIAGNOSIS — Z7984 Long term (current) use of oral hypoglycemic drugs: Secondary | ICD-10-CM | POA: Diagnosis not present

## 2016-12-15 DIAGNOSIS — Z882 Allergy status to sulfonamides status: Secondary | ICD-10-CM | POA: Diagnosis not present

## 2016-12-15 DIAGNOSIS — Z510 Encounter for antineoplastic radiation therapy: Secondary | ICD-10-CM | POA: Diagnosis not present

## 2016-12-15 DIAGNOSIS — Z880 Allergy status to penicillin: Secondary | ICD-10-CM | POA: Diagnosis not present

## 2016-12-15 DIAGNOSIS — C50212 Malignant neoplasm of upper-inner quadrant of left female breast: Secondary | ICD-10-CM

## 2016-12-15 DIAGNOSIS — Z79811 Long term (current) use of aromatase inhibitors: Secondary | ICD-10-CM | POA: Diagnosis not present

## 2016-12-15 MED ORDER — DIPHENHYDRAMINE HCL 25 MG PO CAPS
ORAL_CAPSULE | ORAL | Status: AC
  Filled 2016-12-15: qty 2

## 2016-12-15 MED ORDER — FAMOTIDINE IN NACL 20-0.9 MG/50ML-% IV SOLN
20.0000 mg | Freq: Once | INTRAVENOUS | Status: AC
  Administered 2016-12-15: 20 mg via INTRAVENOUS

## 2016-12-15 MED ORDER — TRASTUZUMAB CHEMO 150 MG IV SOLR
6.0000 mg/kg | Freq: Once | INTRAVENOUS | Status: AC
  Administered 2016-12-15: 630 mg via INTRAVENOUS
  Filled 2016-12-15: qty 30

## 2016-12-15 MED ORDER — HEPARIN SOD (PORK) LOCK FLUSH 100 UNIT/ML IV SOLN
500.0000 [IU] | Freq: Once | INTRAVENOUS | Status: AC | PRN
  Administered 2016-12-15: 500 [IU]
  Filled 2016-12-15: qty 5

## 2016-12-15 MED ORDER — ACETAMINOPHEN 325 MG PO TABS
ORAL_TABLET | ORAL | Status: AC
  Filled 2016-12-15: qty 2

## 2016-12-15 MED ORDER — DIPHENHYDRAMINE HCL 25 MG PO CAPS
50.0000 mg | ORAL_CAPSULE | Freq: Once | ORAL | Status: AC
  Administered 2016-12-15: 50 mg via ORAL

## 2016-12-15 MED ORDER — ACETAMINOPHEN 325 MG PO TABS
650.0000 mg | ORAL_TABLET | Freq: Once | ORAL | Status: AC
  Administered 2016-12-15: 650 mg via ORAL

## 2016-12-15 MED ORDER — SODIUM CHLORIDE 0.9 % IV SOLN
Freq: Once | INTRAVENOUS | Status: AC
  Administered 2016-12-15: 12:00:00 via INTRAVENOUS

## 2016-12-15 MED ORDER — FAMOTIDINE IN NACL 20-0.9 MG/50ML-% IV SOLN
INTRAVENOUS | Status: AC
  Filled 2016-12-15: qty 50

## 2016-12-15 MED ORDER — SODIUM CHLORIDE 0.9% FLUSH
10.0000 mL | INTRAVENOUS | Status: DC | PRN
  Administered 2016-12-15: 10 mL
  Filled 2016-12-15: qty 10

## 2016-12-15 NOTE — Progress Notes (Signed)
Pt due for echo. Pt has appt with her cardiologist 12/16/2016, discussed with pt for her to ask cardiologist to be scheduled for an echo. Pt verbalized understanding.

## 2016-12-15 NOTE — Patient Instructions (Addendum)
[name] Discharge Instructions for Patients Receiving Chemotherapy  AT TOMORROWS APPT WITH YOUR CARDIOLOGIST PLEASE ASK HIM TO SCHEDULE YOU FOR AND ECHOCARDIOGRAM  Today you received the following chemotherapy agents: Herceptin   To help prevent nausea and vomiting after your treatment, we encourage you to take your nausea medication as directed.    If you develop nausea and vomiting that is not controlled by your nausea medication, call the clinic.   BELOW ARE SYMPTOMS THAT SHOULD BE REPORTED IMMEDIATELY:  *FEVER GREATER THAN 100.5 F  *CHILLS WITH OR WITHOUT FEVER  NAUSEA AND VOMITING THAT IS NOT CONTROLLED WITH YOUR NAUSEA MEDICATION  *UNUSUAL SHORTNESS OF BREATH  *UNUSUAL BRUISING OR BLEEDING  TENDERNESS IN MOUTH AND THROAT WITH OR WITHOUT PRESENCE OF ULCERS  *URINARY PROBLEMS  *BOWEL PROBLEMS  UNUSUAL RASH Items with * indicate a potential emergency and should be followed up as soon as possible.  Feel free to call the clinic you have any questions or concerns. The clinic phone number is [phone].  Please show the [place] at check-in to the Emergency Department and triage nurse.

## 2016-12-16 ENCOUNTER — Ambulatory Visit
Admission: RE | Admit: 2016-12-16 | Discharge: 2016-12-16 | Disposition: A | Discharge status: Home / Self Care | Payer: BLUE CROSS/BLUE SHIELD | Source: Ambulatory Visit | Attending: Radiation Oncology | Admitting: Radiation Oncology

## 2016-12-16 DIAGNOSIS — Z7984 Long term (current) use of oral hypoglycemic drugs: Secondary | ICD-10-CM | POA: Diagnosis not present

## 2016-12-16 DIAGNOSIS — I10 Essential (primary) hypertension: Secondary | ICD-10-CM | POA: Diagnosis not present

## 2016-12-16 DIAGNOSIS — I2510 Atherosclerotic heart disease of native coronary artery without angina pectoris: Secondary | ICD-10-CM | POA: Diagnosis not present

## 2016-12-16 DIAGNOSIS — Z882 Allergy status to sulfonamides status: Secondary | ICD-10-CM | POA: Diagnosis not present

## 2016-12-16 DIAGNOSIS — Z880 Allergy status to penicillin: Secondary | ICD-10-CM | POA: Diagnosis not present

## 2016-12-16 DIAGNOSIS — Z7951 Long term (current) use of inhaled steroids: Secondary | ICD-10-CM | POA: Diagnosis not present

## 2016-12-16 DIAGNOSIS — Z79811 Long term (current) use of aromatase inhibitors: Secondary | ICD-10-CM | POA: Diagnosis not present

## 2016-12-16 DIAGNOSIS — Z170 Estrogen receptor positive status [ER+]: Secondary | ICD-10-CM | POA: Diagnosis not present

## 2016-12-16 DIAGNOSIS — E782 Mixed hyperlipidemia: Secondary | ICD-10-CM | POA: Diagnosis not present

## 2016-12-16 DIAGNOSIS — Z79899 Other long term (current) drug therapy: Secondary | ICD-10-CM | POA: Diagnosis not present

## 2016-12-16 DIAGNOSIS — C50212 Malignant neoplasm of upper-inner quadrant of left female breast: Secondary | ICD-10-CM | POA: Diagnosis not present

## 2016-12-16 DIAGNOSIS — E1121 Type 2 diabetes mellitus with diabetic nephropathy: Secondary | ICD-10-CM | POA: Diagnosis not present

## 2016-12-16 DIAGNOSIS — Z510 Encounter for antineoplastic radiation therapy: Secondary | ICD-10-CM | POA: Diagnosis not present

## 2016-12-17 ENCOUNTER — Ambulatory Visit: Payer: BLUE CROSS/BLUE SHIELD

## 2016-12-18 ENCOUNTER — Ambulatory Visit
Admission: RE | Admit: 2016-12-18 | Discharge: 2016-12-18 | Disposition: A | Discharge status: Home / Self Care | Payer: BLUE CROSS/BLUE SHIELD | Source: Ambulatory Visit | Attending: Radiation Oncology | Admitting: Radiation Oncology

## 2016-12-18 DIAGNOSIS — Z79899 Other long term (current) drug therapy: Secondary | ICD-10-CM | POA: Diagnosis not present

## 2016-12-18 DIAGNOSIS — Z7984 Long term (current) use of oral hypoglycemic drugs: Secondary | ICD-10-CM | POA: Diagnosis not present

## 2016-12-18 DIAGNOSIS — C50212 Malignant neoplasm of upper-inner quadrant of left female breast: Secondary | ICD-10-CM | POA: Diagnosis not present

## 2016-12-18 DIAGNOSIS — Z510 Encounter for antineoplastic radiation therapy: Secondary | ICD-10-CM | POA: Diagnosis not present

## 2016-12-18 DIAGNOSIS — Z880 Allergy status to penicillin: Secondary | ICD-10-CM | POA: Diagnosis not present

## 2016-12-18 DIAGNOSIS — Z170 Estrogen receptor positive status [ER+]: Secondary | ICD-10-CM | POA: Diagnosis not present

## 2016-12-18 DIAGNOSIS — Z882 Allergy status to sulfonamides status: Secondary | ICD-10-CM | POA: Diagnosis not present

## 2016-12-18 DIAGNOSIS — Z7951 Long term (current) use of inhaled steroids: Secondary | ICD-10-CM | POA: Diagnosis not present

## 2016-12-18 DIAGNOSIS — Z79811 Long term (current) use of aromatase inhibitors: Secondary | ICD-10-CM | POA: Diagnosis not present

## 2016-12-21 ENCOUNTER — Ambulatory Visit
Admission: RE | Admit: 2016-12-21 | Discharge: 2016-12-21 | Disposition: A | Discharge status: Home / Self Care | Payer: BLUE CROSS/BLUE SHIELD | Source: Ambulatory Visit | Attending: Radiation Oncology | Admitting: Radiation Oncology

## 2016-12-21 DIAGNOSIS — Z880 Allergy status to penicillin: Secondary | ICD-10-CM | POA: Diagnosis not present

## 2016-12-21 DIAGNOSIS — Z510 Encounter for antineoplastic radiation therapy: Secondary | ICD-10-CM | POA: Diagnosis not present

## 2016-12-21 DIAGNOSIS — Z79899 Other long term (current) drug therapy: Secondary | ICD-10-CM | POA: Diagnosis not present

## 2016-12-21 DIAGNOSIS — C50212 Malignant neoplasm of upper-inner quadrant of left female breast: Secondary | ICD-10-CM | POA: Diagnosis not present

## 2016-12-21 DIAGNOSIS — Z7951 Long term (current) use of inhaled steroids: Secondary | ICD-10-CM | POA: Diagnosis not present

## 2016-12-21 DIAGNOSIS — Z79811 Long term (current) use of aromatase inhibitors: Secondary | ICD-10-CM | POA: Diagnosis not present

## 2016-12-21 DIAGNOSIS — Z7984 Long term (current) use of oral hypoglycemic drugs: Secondary | ICD-10-CM | POA: Diagnosis not present

## 2016-12-21 DIAGNOSIS — Z882 Allergy status to sulfonamides status: Secondary | ICD-10-CM | POA: Diagnosis not present

## 2016-12-21 DIAGNOSIS — Z170 Estrogen receptor positive status [ER+]: Secondary | ICD-10-CM | POA: Diagnosis not present

## 2016-12-22 ENCOUNTER — Ambulatory Visit: Payer: BLUE CROSS/BLUE SHIELD

## 2016-12-22 ENCOUNTER — Ambulatory Visit: Payer: BLUE CROSS/BLUE SHIELD | Admitting: Radiation Oncology

## 2016-12-23 ENCOUNTER — Ambulatory Visit: Payer: BLUE CROSS/BLUE SHIELD

## 2016-12-24 ENCOUNTER — Ambulatory Visit: Payer: BLUE CROSS/BLUE SHIELD

## 2016-12-25 ENCOUNTER — Ambulatory Visit: Payer: BLUE CROSS/BLUE SHIELD

## 2016-12-27 DIAGNOSIS — G4733 Obstructive sleep apnea (adult) (pediatric): Secondary | ICD-10-CM | POA: Diagnosis not present

## 2016-12-28 ENCOUNTER — Ambulatory Visit: Payer: BLUE CROSS/BLUE SHIELD

## 2016-12-29 ENCOUNTER — Ambulatory Visit: Payer: BLUE CROSS/BLUE SHIELD | Admitting: Radiation Oncology

## 2016-12-29 ENCOUNTER — Ambulatory Visit
Admission: RE | Admit: 2016-12-29 | Discharge: 2016-12-29 | Disposition: A | Discharge status: Home / Self Care | Payer: BLUE CROSS/BLUE SHIELD | Source: Ambulatory Visit | Attending: Radiation Oncology | Admitting: Radiation Oncology

## 2016-12-29 ENCOUNTER — Encounter: Payer: Self-pay | Admitting: Oncology

## 2016-12-29 DIAGNOSIS — Z170 Estrogen receptor positive status [ER+]: Principal | ICD-10-CM

## 2016-12-29 DIAGNOSIS — Z79899 Other long term (current) drug therapy: Secondary | ICD-10-CM | POA: Diagnosis not present

## 2016-12-29 DIAGNOSIS — Z79811 Long term (current) use of aromatase inhibitors: Secondary | ICD-10-CM | POA: Diagnosis not present

## 2016-12-29 DIAGNOSIS — Z510 Encounter for antineoplastic radiation therapy: Secondary | ICD-10-CM | POA: Diagnosis not present

## 2016-12-29 DIAGNOSIS — Z882 Allergy status to sulfonamides status: Secondary | ICD-10-CM | POA: Diagnosis not present

## 2016-12-29 DIAGNOSIS — Z7951 Long term (current) use of inhaled steroids: Secondary | ICD-10-CM | POA: Diagnosis not present

## 2016-12-29 DIAGNOSIS — C50212 Malignant neoplasm of upper-inner quadrant of left female breast: Secondary | ICD-10-CM | POA: Diagnosis not present

## 2016-12-29 DIAGNOSIS — Z880 Allergy status to penicillin: Secondary | ICD-10-CM | POA: Diagnosis not present

## 2016-12-29 DIAGNOSIS — Z7984 Long term (current) use of oral hypoglycemic drugs: Secondary | ICD-10-CM | POA: Diagnosis not present

## 2016-12-29 MED ORDER — SONAFINE EX EMUL
1.0000 "application " | Freq: Once | CUTANEOUS | Status: AC
  Administered 2016-12-29: 1 via TOPICAL

## 2016-12-30 ENCOUNTER — Ambulatory Visit: Payer: BLUE CROSS/BLUE SHIELD

## 2016-12-30 ENCOUNTER — Ambulatory Visit
Admission: RE | Admit: 2016-12-30 | Discharge: 2016-12-30 | Disposition: A | Discharge status: Home / Self Care | Payer: BLUE CROSS/BLUE SHIELD | Source: Ambulatory Visit | Attending: Radiation Oncology | Admitting: Radiation Oncology

## 2016-12-30 DIAGNOSIS — Z170 Estrogen receptor positive status [ER+]: Secondary | ICD-10-CM | POA: Diagnosis not present

## 2016-12-30 DIAGNOSIS — C50212 Malignant neoplasm of upper-inner quadrant of left female breast: Secondary | ICD-10-CM | POA: Diagnosis not present

## 2016-12-30 DIAGNOSIS — Z79811 Long term (current) use of aromatase inhibitors: Secondary | ICD-10-CM | POA: Diagnosis not present

## 2016-12-30 DIAGNOSIS — Z510 Encounter for antineoplastic radiation therapy: Secondary | ICD-10-CM | POA: Diagnosis not present

## 2016-12-30 DIAGNOSIS — Z7984 Long term (current) use of oral hypoglycemic drugs: Secondary | ICD-10-CM | POA: Diagnosis not present

## 2016-12-30 DIAGNOSIS — Z7951 Long term (current) use of inhaled steroids: Secondary | ICD-10-CM | POA: Diagnosis not present

## 2016-12-30 DIAGNOSIS — Z79899 Other long term (current) drug therapy: Secondary | ICD-10-CM | POA: Diagnosis not present

## 2016-12-30 DIAGNOSIS — Z880 Allergy status to penicillin: Secondary | ICD-10-CM | POA: Diagnosis not present

## 2016-12-30 DIAGNOSIS — Z882 Allergy status to sulfonamides status: Secondary | ICD-10-CM | POA: Diagnosis not present

## 2016-12-31 ENCOUNTER — Ambulatory Visit
Admission: RE | Admit: 2016-12-31 | Discharge: 2016-12-31 | Disposition: A | Discharge status: Home / Self Care | Payer: BLUE CROSS/BLUE SHIELD | Source: Ambulatory Visit | Attending: Radiation Oncology | Admitting: Radiation Oncology

## 2016-12-31 ENCOUNTER — Ambulatory Visit: Payer: BLUE CROSS/BLUE SHIELD

## 2016-12-31 DIAGNOSIS — Z882 Allergy status to sulfonamides status: Secondary | ICD-10-CM | POA: Diagnosis not present

## 2016-12-31 DIAGNOSIS — Z880 Allergy status to penicillin: Secondary | ICD-10-CM | POA: Diagnosis not present

## 2016-12-31 DIAGNOSIS — Z7951 Long term (current) use of inhaled steroids: Secondary | ICD-10-CM | POA: Diagnosis not present

## 2016-12-31 DIAGNOSIS — Z7984 Long term (current) use of oral hypoglycemic drugs: Secondary | ICD-10-CM | POA: Diagnosis not present

## 2016-12-31 DIAGNOSIS — Z79811 Long term (current) use of aromatase inhibitors: Secondary | ICD-10-CM | POA: Diagnosis not present

## 2016-12-31 DIAGNOSIS — Z510 Encounter for antineoplastic radiation therapy: Secondary | ICD-10-CM | POA: Diagnosis not present

## 2016-12-31 DIAGNOSIS — Z170 Estrogen receptor positive status [ER+]: Secondary | ICD-10-CM | POA: Diagnosis not present

## 2016-12-31 DIAGNOSIS — Z79899 Other long term (current) drug therapy: Secondary | ICD-10-CM | POA: Diagnosis not present

## 2016-12-31 DIAGNOSIS — C50212 Malignant neoplasm of upper-inner quadrant of left female breast: Secondary | ICD-10-CM | POA: Diagnosis not present

## 2017-01-01 ENCOUNTER — Ambulatory Visit: Payer: BLUE CROSS/BLUE SHIELD

## 2017-01-01 ENCOUNTER — Other Ambulatory Visit: Payer: Self-pay

## 2017-01-01 ENCOUNTER — Ambulatory Visit
Admission: RE | Admit: 2017-01-01 | Discharge: 2017-01-01 | Disposition: A | Discharge status: Home / Self Care | Payer: BLUE CROSS/BLUE SHIELD | Source: Ambulatory Visit | Attending: Radiation Oncology | Admitting: Radiation Oncology

## 2017-01-01 DIAGNOSIS — Z7951 Long term (current) use of inhaled steroids: Secondary | ICD-10-CM | POA: Diagnosis not present

## 2017-01-01 DIAGNOSIS — Z5181 Encounter for therapeutic drug level monitoring: Secondary | ICD-10-CM

## 2017-01-01 DIAGNOSIS — Z79811 Long term (current) use of aromatase inhibitors: Secondary | ICD-10-CM | POA: Diagnosis not present

## 2017-01-01 DIAGNOSIS — C50212 Malignant neoplasm of upper-inner quadrant of left female breast: Secondary | ICD-10-CM | POA: Diagnosis not present

## 2017-01-01 DIAGNOSIS — Z880 Allergy status to penicillin: Secondary | ICD-10-CM | POA: Diagnosis not present

## 2017-01-01 DIAGNOSIS — Z79899 Other long term (current) drug therapy: Secondary | ICD-10-CM

## 2017-01-01 DIAGNOSIS — Z7984 Long term (current) use of oral hypoglycemic drugs: Secondary | ICD-10-CM | POA: Diagnosis not present

## 2017-01-01 DIAGNOSIS — Z510 Encounter for antineoplastic radiation therapy: Secondary | ICD-10-CM | POA: Diagnosis not present

## 2017-01-01 DIAGNOSIS — Z170 Estrogen receptor positive status [ER+]: Secondary | ICD-10-CM | POA: Diagnosis not present

## 2017-01-01 DIAGNOSIS — Z882 Allergy status to sulfonamides status: Secondary | ICD-10-CM | POA: Diagnosis not present

## 2017-01-04 ENCOUNTER — Ambulatory Visit: Payer: BLUE CROSS/BLUE SHIELD

## 2017-01-04 ENCOUNTER — Ambulatory Visit
Admission: RE | Admit: 2017-01-04 | Discharge: 2017-01-04 | Disposition: A | Discharge status: Home / Self Care | Payer: BLUE CROSS/BLUE SHIELD | Source: Ambulatory Visit | Attending: Radiation Oncology | Admitting: Radiation Oncology

## 2017-01-04 ENCOUNTER — Other Ambulatory Visit (HOSPITAL_COMMUNITY): Payer: Self-pay

## 2017-01-04 ENCOUNTER — Telehealth: Payer: Self-pay

## 2017-01-04 DIAGNOSIS — Z880 Allergy status to penicillin: Secondary | ICD-10-CM | POA: Diagnosis not present

## 2017-01-04 DIAGNOSIS — Z510 Encounter for antineoplastic radiation therapy: Secondary | ICD-10-CM | POA: Diagnosis not present

## 2017-01-04 DIAGNOSIS — Z882 Allergy status to sulfonamides status: Secondary | ICD-10-CM | POA: Diagnosis not present

## 2017-01-04 DIAGNOSIS — C50212 Malignant neoplasm of upper-inner quadrant of left female breast: Secondary | ICD-10-CM | POA: Diagnosis not present

## 2017-01-04 DIAGNOSIS — Z7951 Long term (current) use of inhaled steroids: Secondary | ICD-10-CM | POA: Diagnosis not present

## 2017-01-04 DIAGNOSIS — Z7984 Long term (current) use of oral hypoglycemic drugs: Secondary | ICD-10-CM | POA: Diagnosis not present

## 2017-01-04 DIAGNOSIS — Z79811 Long term (current) use of aromatase inhibitors: Secondary | ICD-10-CM | POA: Diagnosis not present

## 2017-01-04 DIAGNOSIS — Z79899 Other long term (current) drug therapy: Secondary | ICD-10-CM | POA: Diagnosis not present

## 2017-01-04 DIAGNOSIS — Z170 Estrogen receptor positive status [ER+]: Secondary | ICD-10-CM | POA: Diagnosis not present

## 2017-01-04 NOTE — Telephone Encounter (Signed)
Spoke with patient and will be able to go tomorrow after treatment. Per 10/8 sch message

## 2017-01-04 NOTE — Telephone Encounter (Signed)
Called and spoke with patient concerning upcoming appointment. Per 10/8 sch message

## 2017-01-05 ENCOUNTER — Ambulatory Visit: Payer: BLUE CROSS/BLUE SHIELD

## 2017-01-05 ENCOUNTER — Other Ambulatory Visit (HOSPITAL_BASED_OUTPATIENT_CLINIC_OR_DEPARTMENT_OTHER): Payer: BLUE CROSS/BLUE SHIELD

## 2017-01-05 ENCOUNTER — Telehealth: Payer: Self-pay | Admitting: Hematology and Oncology

## 2017-01-05 ENCOUNTER — Encounter: Payer: Self-pay | Admitting: *Deleted

## 2017-01-05 ENCOUNTER — Ambulatory Visit (HOSPITAL_COMMUNITY)
Admission: RE | Admit: 2017-01-05 | Discharge: 2017-01-05 | Disposition: A | Discharge status: Home / Self Care | Payer: BLUE CROSS/BLUE SHIELD | Source: Ambulatory Visit | Attending: Hematology and Oncology | Admitting: Hematology and Oncology

## 2017-01-05 ENCOUNTER — Ambulatory Visit
Admission: RE | Admit: 2017-01-05 | Discharge: 2017-01-05 | Disposition: A | Discharge status: Home / Self Care | Payer: BLUE CROSS/BLUE SHIELD | Source: Ambulatory Visit | Attending: Radiation Oncology | Admitting: Radiation Oncology

## 2017-01-05 ENCOUNTER — Ambulatory Visit
Admission: RE | Admit: 2017-01-05 | Payer: BLUE CROSS/BLUE SHIELD | Source: Ambulatory Visit | Admitting: Radiation Oncology

## 2017-01-05 ENCOUNTER — Ambulatory Visit (HOSPITAL_BASED_OUTPATIENT_CLINIC_OR_DEPARTMENT_OTHER): Payer: BLUE CROSS/BLUE SHIELD

## 2017-01-05 ENCOUNTER — Ambulatory Visit (HOSPITAL_BASED_OUTPATIENT_CLINIC_OR_DEPARTMENT_OTHER): Payer: BLUE CROSS/BLUE SHIELD | Admitting: Hematology and Oncology

## 2017-01-05 DIAGNOSIS — R0600 Dyspnea, unspecified: Secondary | ICD-10-CM | POA: Diagnosis not present

## 2017-01-05 DIAGNOSIS — C50212 Malignant neoplasm of upper-inner quadrant of left female breast: Secondary | ICD-10-CM

## 2017-01-05 DIAGNOSIS — Z79899 Other long term (current) drug therapy: Secondary | ICD-10-CM | POA: Diagnosis not present

## 2017-01-05 DIAGNOSIS — Z5181 Encounter for therapeutic drug level monitoring: Secondary | ICD-10-CM | POA: Diagnosis not present

## 2017-01-05 DIAGNOSIS — Z5111 Encounter for antineoplastic chemotherapy: Secondary | ICD-10-CM

## 2017-01-05 DIAGNOSIS — Z95828 Presence of other vascular implants and grafts: Secondary | ICD-10-CM

## 2017-01-05 DIAGNOSIS — Z170 Estrogen receptor positive status [ER+]: Principal | ICD-10-CM

## 2017-01-05 DIAGNOSIS — Z510 Encounter for antineoplastic radiation therapy: Secondary | ICD-10-CM | POA: Diagnosis not present

## 2017-01-05 DIAGNOSIS — Z7951 Long term (current) use of inhaled steroids: Secondary | ICD-10-CM | POA: Diagnosis not present

## 2017-01-05 DIAGNOSIS — Z79811 Long term (current) use of aromatase inhibitors: Secondary | ICD-10-CM | POA: Diagnosis not present

## 2017-01-05 DIAGNOSIS — Z882 Allergy status to sulfonamides status: Secondary | ICD-10-CM | POA: Diagnosis not present

## 2017-01-05 DIAGNOSIS — Z880 Allergy status to penicillin: Secondary | ICD-10-CM | POA: Diagnosis not present

## 2017-01-05 DIAGNOSIS — I119 Hypertensive heart disease without heart failure: Secondary | ICD-10-CM | POA: Diagnosis not present

## 2017-01-05 DIAGNOSIS — D259 Leiomyoma of uterus, unspecified: Secondary | ICD-10-CM

## 2017-01-05 DIAGNOSIS — Z7984 Long term (current) use of oral hypoglycemic drugs: Secondary | ICD-10-CM | POA: Diagnosis not present

## 2017-01-05 DIAGNOSIS — D649 Anemia, unspecified: Secondary | ICD-10-CM

## 2017-01-05 LAB — COMPREHENSIVE METABOLIC PANEL
ALT: 11 U/L (ref 0–55)
ANION GAP: 12 meq/L — AB (ref 3–11)
AST: 15 U/L (ref 5–34)
Albumin: 3.8 g/dL (ref 3.5–5.0)
Alkaline Phosphatase: 94 U/L (ref 40–150)
BILIRUBIN TOTAL: 0.8 mg/dL (ref 0.20–1.20)
BUN: 18.6 mg/dL (ref 7.0–26.0)
CALCIUM: 9.4 mg/dL (ref 8.4–10.4)
CO2: 26 mEq/L (ref 22–29)
CREATININE: 1.1 mg/dL (ref 0.6–1.1)
Chloride: 103 mEq/L (ref 98–109)
EGFR: 65 mL/min/{1.73_m2} — ABNORMAL LOW (ref >90)
Glucose: 103 mg/dl (ref 70–140)
Potassium: 3.7 mEq/L (ref 3.5–5.1)
Sodium: 141 mEq/L (ref 136–145)
TOTAL PROTEIN: 8.3 g/dL (ref 6.4–8.3)

## 2017-01-05 LAB — CBC WITH DIFFERENTIAL/PLATELET
BASO%: 0.6 % (ref 0.0–2.0)
Basophils Absolute: 0 10*3/uL (ref 0.0–0.1)
EOS%: 4 % (ref 0.0–7.0)
Eosinophils Absolute: 0.2 10*3/uL (ref 0.0–0.5)
HEMATOCRIT: 42.3 % (ref 34.8–46.6)
HGB: 14.4 g/dL (ref 11.6–15.9)
LYMPH#: 0.9 10*3/uL (ref 0.9–3.3)
LYMPH%: 18.1 % (ref 14.0–49.7)
MCH: 33.3 pg (ref 25.1–34.0)
MCHC: 34 g/dL (ref 31.5–36.0)
MCV: 97.9 fL (ref 79.5–101.0)
MONO#: 0.5 10*3/uL (ref 0.1–0.9)
MONO%: 9 % (ref 0.0–14.0)
NEUT%: 68.3 % (ref 38.4–76.8)
NEUTROS ABS: 3.5 10*3/uL (ref 1.5–6.5)
PLATELETS: 166 10*3/uL (ref 145–400)
RBC: 4.32 10*6/uL (ref 3.70–5.45)
RDW: 13.8 % (ref 11.2–14.5)
WBC: 5.1 10*3/uL (ref 3.9–10.3)

## 2017-01-05 MED ORDER — ACETAMINOPHEN 325 MG PO TABS
ORAL_TABLET | ORAL | Status: AC
  Filled 2017-01-05: qty 2

## 2017-01-05 MED ORDER — DIPHENHYDRAMINE HCL 25 MG PO CAPS
50.0000 mg | ORAL_CAPSULE | Freq: Once | ORAL | Status: AC
  Administered 2017-01-05: 50 mg via ORAL

## 2017-01-05 MED ORDER — DIPHENHYDRAMINE HCL 25 MG PO CAPS
ORAL_CAPSULE | ORAL | Status: AC
  Filled 2017-01-05: qty 2

## 2017-01-05 MED ORDER — HEPARIN SOD (PORK) LOCK FLUSH 100 UNIT/ML IV SOLN
500.0000 [IU] | Freq: Once | INTRAVENOUS | Status: AC | PRN
  Administered 2017-01-05: 500 [IU]
  Filled 2017-01-05: qty 5

## 2017-01-05 MED ORDER — FAMOTIDINE IN NACL 20-0.9 MG/50ML-% IV SOLN
20.0000 mg | Freq: Once | INTRAVENOUS | Status: AC
  Administered 2017-01-05: 20 mg via INTRAVENOUS

## 2017-01-05 MED ORDER — SODIUM CHLORIDE 0.9 % IV SOLN
Freq: Once | INTRAVENOUS | Status: AC
  Administered 2017-01-05: 11:00:00 via INTRAVENOUS

## 2017-01-05 MED ORDER — SODIUM CHLORIDE 0.9% FLUSH
10.0000 mL | Freq: Once | INTRAVENOUS | Status: AC
  Administered 2017-01-05: 10 mL
  Filled 2017-01-05: qty 10

## 2017-01-05 MED ORDER — SODIUM CHLORIDE 0.9% FLUSH
10.0000 mL | INTRAVENOUS | Status: DC | PRN
  Administered 2017-01-05: 10 mL
  Filled 2017-01-05: qty 10

## 2017-01-05 MED ORDER — ACETAMINOPHEN 325 MG PO TABS
650.0000 mg | ORAL_TABLET | Freq: Once | ORAL | Status: AC
  Administered 2017-01-05: 650 mg via ORAL

## 2017-01-05 MED ORDER — TRASTUZUMAB CHEMO 150 MG IV SOLR
600.0000 mg | Freq: Once | INTRAVENOUS | Status: AC
  Administered 2017-01-05: 600 mg via INTRAVENOUS
  Filled 2017-01-05: qty 28.57

## 2017-01-05 MED ORDER — FAMOTIDINE IN NACL 20-0.9 MG/50ML-% IV SOLN
INTRAVENOUS | Status: AC
  Filled 2017-01-05: qty 50

## 2017-01-05 NOTE — Assessment & Plan Note (Signed)
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 Adjuvant chemotherapy with Abraxane and Herceptin (cannot take Taxol because she could not receive steroids)weekly 12 followed by every 3 week Herceptin for one year 2. followed by adjuvant radiation 11/25/2016-01/05/2017  3. Followed by adjuvant antiestrogen therapy ------------------------------------------------------------------------------------------------------------------------------- Treatment plan: Adjuvant antiestrogen therapy with tamoxifen 20 mg daily 10 years  Tamoxifen counseling:We discussed the risks and benefits of tamoxifen. These include but not limited to insomnia, hot flashes, mood changes, vaginal dryness, and weight gain. Although rare, serious side effects including endometrial cancer, risk of blood clots were also discussed. We strongly believe that the benefits far outweigh the risks. Patient understands these risks and consented to starting treatment. Planned treatment duration is 10 years.  I discussed with the patient that Neratinib has been approved in estrogen receptor positive and HER-2 positive breast cancers. However the biggest benefit appears to be in patients of lymph node involvement. Because there is no lymph node involvement and did not recommend Neratinib.  Return to clinic every 3 weeks for Herceptin every 6 weeks for follow-up with me

## 2017-01-05 NOTE — Progress Notes (Signed)
Pt okay to treat with last echo from 07/2016, per Dr.Gudena. Scheduled for echo today after chemo treatment.

## 2017-01-05 NOTE — Patient Instructions (Signed)
Trastuzumab injection for infusion What is this medicine? TRASTUZUMAB (tras TOO zoo mab) is a monoclonal antibody. It is used to treat breast cancer and stomach cancer. This medicine may be used for other purposes; ask your health care provider or pharmacist if you have questions. COMMON BRAND NAME(S): Herceptin What should I tell my health care provider before I take this medicine? They need to know if you have any of these conditions: -heart disease -heart failure -lung or breathing disease, like asthma -an unusual or allergic reaction to trastuzumab, benzyl alcohol, or other medications, foods, dyes, or preservatives -pregnant or trying to get pregnant -breast-feeding How should I use this medicine? This drug is given as an infusion into a vein. It is administered in a hospital or clinic by a specially trained health care professional. Talk to your pediatrician regarding the use of this medicine in children. This medicine is not approved for use in children. Overdosage: If you think you have taken too much of this medicine contact a poison control center or emergency room at once. NOTE: This medicine is only for you. Do not share this medicine with others. What if I miss a dose? It is important not to miss a dose. Call your doctor or health care professional if you are unable to keep an appointment. What may interact with this medicine? This medicine may interact with the following medications: -certain types of chemotherapy, such as daunorubicin, doxorubicin, epirubicin, and idarubicin This list may not describe all possible interactions. Give your health care provider a list of all the medicines, herbs, non-prescription drugs, or dietary supplements you use. Also tell them if you smoke, drink alcohol, or use illegal drugs. Some items may interact with your medicine. What should I watch for while using this medicine? Visit your doctor for checks on your progress. Report any side effects.  Continue your course of treatment even though you feel ill unless your doctor tells you to stop. Call your doctor or health care professional for advice if you get a fever, chills or sore throat, or other symptoms of a cold or flu. Do not treat yourself. Try to avoid being around people who are sick. You may experience fever, chills and shaking during your first infusion. These effects are usually mild and can be treated with other medicines. Report any side effects during the infusion to your health care professional. Fever and chills usually do not happen with later infusions. Do not become pregnant while taking this medicine or for 7 months after stopping it. Women should inform their doctor if they wish to become pregnant or think they might be pregnant. Women of child-bearing potential will need to have a negative pregnancy test before starting this medicine. There is a potential for serious side effects to an unborn child. Talk to your health care professional or pharmacist for more information. Do not breast-feed an infant while taking this medicine or for 7 months after stopping it. Women must use effective birth control with this medicine. What side effects may I notice from receiving this medicine? Side effects that you should report to your doctor or health care professional as soon as possible: -allergic reactions like skin rash, itching or hives, swelling of the face, lips, or tongue -chest pain or palpitations -cough -dizziness -feeling faint or lightheaded, falls -fever -general ill feeling or flu-like symptoms -signs of worsening heart failure like breathing problems; swelling in your legs and feet -unusually weak or tired Side effects that usually do not require medical  attention (report to your doctor or health care professional if they continue or are bothersome): -bone pain -changes in taste -diarrhea -joint pain -nausea/vomiting -weight loss This list may not describe all  possible side effects. Call your doctor for medical advice about side effects. You may report side effects to FDA at 1-800-FDA-1088. Where should I keep my medicine? This drug is given in a hospital or clinic and will not be stored at home. NOTE: This sheet is a summary. It may not cover all possible information. If you have questions about this medicine, talk to your doctor, pharmacist, or health care provider.  2018 Elsevier/Gold Standard (2016-03-10 14:37:52)

## 2017-01-05 NOTE — Progress Notes (Signed)
Patient Care Team: McLean-Scocuzza, Nino Glow, MD as PCP - General (Internal Medicine) Vonna Drafts, FNP as Nurse Practitioner (Nurse Practitioner) Stark Klein, MD as Consulting Physician (General Surgery) Nicholas Lose, MD as Consulting Physician (Hematology and Oncology) Gery Pray, MD as Consulting Physician (Radiation Oncology)  DIAGNOSIS:  Encounter Diagnosis  Name Primary?  . Malignant neoplasm of upper-inner quadrant of left breast in female, estrogen receptor positive ([place])     SUMMARY OF ONCOLOGIC HISTORY:   Malignant neoplasm of upper-inner quadrant of left breast in female, estrogen receptor positive ([place])   03/04/2016 Initial Diagnosis    Screening detected left breast density posterior medial 1.4 cm by ultrasound axilla negative, grade 3 IDC ER 60%, PR 0%, Ki-67 30%, HER-2 positive ratio 6.04, copy #16; T1 cN0 stage IA clinical stage      03/21/2016 Genetic Testing    NEgative genetic testing on the comprehensive cancer panel and Negative genetic testing for the MSH2 inversion analysis ([place] inversion). The Comprehensive Cancer Panel offered by [facility] includes sequencing and/or deletion duplication testing of the following 32 genes: APC, ATM, AXIN2, BARD1, BMPR1A, BRCA1, BRCA2, BRIP1, CDH1, CDK4, CDKN2A, CHEK2, EPCAM, FANCC, MLH1, MSH2, MSH6, MUTYH, NBN, PALB2, PMS2, POLD1, POLE, PTEN, RAD51C, RAD51D, SCG5/GREM1, SMAD4, STK11, TP53, VHL, and XRCC2.   The report date is March 21, 2016.      07/27/2016 Surgery    Left lumpectomy: IDC grade 2, 2.6 cm, DCIS intermediate grade,0/3 lymph nodes negative, ER 60%, PR 0%, Ki-67 30%, HER-2 positive ratio 6.04, T2 N0 stage II a      09/01/2016 - 11/03/2016 Chemotherapy    Abraxane Herceptin weekly 12 followed by Herceptin maintenance every 3 weeks       11/19/2016 - 01/05/2017 Radiation Therapy    Adjuvant radiation therapy       CHIEF COMPLIANT: Radiation therapy to be completed 01/15/2017  INTERVAL HISTORY:  [name] is a 51 year old with above-mentioned history of left breast cancer treated with lumpectomy and systemic chemotherapy and is completing adjuvant radiation therapy on 01/15/2017. She is currently on Herceptin maintenance and appears to be tolerating it fairly well. She had complained of left-sided chest wall pain related to radiation therapy. She had this intermittently throughout radiation. Denies any palpitations dizziness lightheadedness or dyspnea.  REVIEW OF SYSTEMS:   Constitutional: Denies fevers, chills or abnormal weight loss Eyes: Denies blurriness of vision Ears, nose, mouth, throat, and face: Denies mucositis or sore throat Respiratory: Denies cough, dyspnea or wheezes Cardiovascular: Denies palpitation, chest discomfort Gastrointestinal:  Denies nausea, heartburn or change in bowel habits Skin: Denies abnormal skin rashes Lymphatics: Denies new lymphadenopathy or easy bruising Neurological:Denies numbness, tingling or new weaknesses Behavioral/Psych: Mood is stable, no new changes  Extremities: No lower extremity edema Breast: Left chest wall pain related to radiation All other systems were reviewed with the patient and are negative.  I have reviewed the past medical history, past surgical history, social history and family history with the patient and they are unchanged from previous note.  ALLERGIES:  is allergic to amoxicillin; ampicillin; and sulfa antibiotics.  MEDICATIONS:  Current Outpatient Prescriptions  Medication Sig Dispense Refill  . acetaminophen (TYLENOL) 500 MG tablet Take 1,000 mg by mouth every 6 (six) hours as needed for moderate pain or headache.     . albuterol (PROVENTIL HFA;VENTOLIN HFA) 108 (90 Base) MCG/ACT inhaler Inhale 2 puffs into the lungs every 6 (six) hours as needed for wheezing or shortness of breath. 1 Inhaler 2  . anastrozole (ARIMIDEX)  1 MG tablet Take 1 mg by mouth daily.  1  . Azelastine-Fluticasone 137-50 MCG/ACT SUSP Place  1 spray into the nose 2 (two) times daily. 23 g 5  . cetirizine (ZYRTEC) 10 MG tablet Take 1 tablet (10 mg total) by mouth at bedtime. (Patient taking differently: Take 10 mg by mouth daily. ) 30 tablet 5  . chlorpheniramine-HYDROcodone (TUSSIONEX PENNKINETIC ER) 10-8 MG/5ML SUER Take 5 mLs by mouth every 12 (twelve) hours as needed for cough. 150 mL 0  . fluticasone (FLONASE) 50 MCG/ACT nasal spray Place 2 sprays into both nostrils daily. (Patient taking differently: Place 2 sprays into both nostrils daily as needed for allergies. ) 16 g 5  . Fluticasone-Salmeterol (ADVAIR DISKUS) 250-50 MCG/DOSE AEPB Inhale 1 puff into the lungs 2 (two) times daily. 60 each 5  . glucose blood (BAYER CONTOUR TEST) test strip TEST twice a day    . hyaluronate sodium (RADIAPLEXRX) GEL Apply 1 application topically once.    . lidocaine-prilocaine (EMLA) cream Apply to affected area once 30 g 3  . losartan (COZAAR) 100 MG tablet Take 1 tablet (100 mg total) by mouth daily. (Patient taking differently: Take 50 mg by mouth daily. ) 30 tablet 0  . metFORMIN (GLUCOPHAGE) 500 MG tablet Take 1 tablet (500 mg total) by mouth 2 (two) times daily with a meal. 60 tablet 2  . metoprolol tartrate (LOPRESSOR) 25 MG tablet Take 1 tablet (25 mg total) by mouth 2 (two) times daily. 60 tablet 2  . non-metallic deodorant (ALRA) MISC Apply 1 application topically daily as needed.    . ondansetron (ZOFRAN) 8 MG tablet Take 1 tablet (8 mg total) by mouth 2 (two) times daily as needed for nausea or vomiting. 30 tablet 1  . OPSUMIT 10 MG TABS Take 10 mg by mouth daily.  8  . oxyCODONE (OXY IR/ROXICODONE) 5 MG immediate release tablet Take 1-2 tablets (5-10 mg total) by mouth every 4 (four) hours as needed for moderate pain. 30 tablet 0  . Polyethyl Glycol-Propyl Glycol (SYSTANE OP) Apply 1 drop to eye daily as needed (dry eyes).    . promethazine-codeine (PHENERGAN WITH CODEINE) 6.25-10 MG/5ML syrup Take 5 mLs by mouth every 4 (four) hours as  needed for cough.   0  . sildenafil (REVATIO) 20 MG tablet Take 20 mg by mouth 3 (three) times daily.    Marland Kitchen spironolactone (ALDACTONE) 50 MG tablet Take 50 mg by mouth daily.     Marland Kitchen torsemide (DEMADEX) 20 MG tablet Take 20 mg by mouth 2 (two) times daily.    Marland Kitchen UPTRAVI 200 MCG TABS Take 200 mcg by mouth 2 (two) times daily.  10   No current facility-administered medications for this visit.     PHYSICAL EXAMINATION: ECOG PERFORMANCE STATUS: 1 - Symptomatic but completely ambulatory  Vitals:   01/05/17 0954  BP: 106/74  Pulse: 88  Resp: 18  Temp: 98.8 F (37.1 C)  SpO2: 94%   Filed Weights   01/05/17 0954  Weight: 227 lb 6.4 oz (103.1 kg)    GENERAL:alert, no distress and comfortable SKIN: skin color, texture, turgor are normal, no rashes or significant lesions EYES: normal, Conjunctiva are pink and non-injected, sclera clear OROPHARYNX:no exudate, no erythema and lips, buccal mucosa, and tongue normal  NECK: supple, thyroid normal size, non-tender, without nodularity LYMPH:  no palpable lymphadenopathy in the cervical, axillary or inguinal LUNGS: clear to auscultation and percussion with normal breathing effort HEART: regular rate & rhythm and no  murmurs and no lower extremity edema ABDOMEN:abdomen soft, non-tender and normal bowel sounds MUSCULOSKELETAL:no cyanosis of digits and no clubbing  NEURO: alert & oriented x 3 with fluent speech, no focal motor/sensory deficits EXTREMITIES: No lower extremity edema  LABORATORY DATA:  I have reviewed the data as listed   Chemistry      Component Value Date/Time   NA 137 11/24/2016 1138   K 3.9 11/24/2016 1138   CL 103 07/28/2016 0335   CO2 27 11/24/2016 1138   BUN 28.3 (H) 11/24/2016 1138   CREATININE 1.4 (H) 11/24/2016 1138      Component Value Date/Time   CALCIUM 9.3 11/24/2016 1138   ALKPHOS 88 11/24/2016 1138   AST 18 11/24/2016 1138   ALT 14 11/24/2016 1138   BILITOT 0.68 11/24/2016 1138       Lab Results    Component Value Date   WBC 5.1 01/05/2017   HGB 14.4 01/05/2017   HCT 42.3 01/05/2017   MCV 97.9 01/05/2017   PLT 166 01/05/2017   NEUTROABS 3.5 01/05/2017    ASSESSMENT & PLAN:  Malignant neoplasm of upper-inner quadrant of left breast in female, estrogen receptor positive ([place]) Left lumpectomy 07/28/2016: IDC grade 2, 2.6 cm, DCIS intermediate grade,0/3 lymph nodes negative, ER 60%, PR 0%, Ki-67 30%, HER-2 positive ratio 6.04, T2 N0 stage II a  Treatment summary: 1. Adjuvant chemotherapy with Abraxane and Herceptin (cannot take Taxol because she could not receive steroids)weekly 12 followed by every 3 week Herceptin for one year 2. followed by adjuvant radiation 11/25/2016-01/05/2017  3. Followed by adjuvant antiestrogen therapy ------------------------------------------------------------------------------------------------------------------------------- Treatment plan: Adjuvant antiestrogen therapy with anastrozole 1 mg daily 01/28/2017  Anastrozole counseling:We discussed the risks and benefits of anti-estrogen therapy with aromatase inhibitors. These include but not limited to insomnia, hot flashes, mood changes, vaginal dryness, bone density loss, and weight gain. We strongly believe that the benefits far outweigh the risks. Patient understands these risks and consented to starting treatment. Planned treatment duration is 5 years.  I discussed with the patient that Neratinib has been approved in estrogen receptor positive and HER-2 positive breast cancers. However the biggest benefit appears to be in patients of lymph node involvement. Because there is no lymph node involvement and did not recommend Neratinib.  Return to clinic every 3 weeks for Herceptin every 6 weeks for follow-up with me  I spent 25 minutes talking to the patient of which more than half was spent in counseling and coordination of care.  No orders of the defined types were placed in this encounter.  The  patient has a good understanding of the overall plan. she agrees with it. she will call with any problems that may develop before the next visit here.   Rulon Eisenmenger, MD 01/05/17

## 2017-01-05 NOTE — Progress Notes (Signed)
  Echocardiogram 2D Echocardiogram has been performed.  [name] [name] 01/05/2017, 3:46 PM

## 2017-01-05 NOTE — Telephone Encounter (Signed)
Gave patient avs and calendar with appts per 10/9 los.

## 2017-01-06 ENCOUNTER — Ambulatory Visit: Payer: BLUE CROSS/BLUE SHIELD

## 2017-01-06 ENCOUNTER — Ambulatory Visit
Admission: RE | Admit: 2017-01-06 | Discharge: 2017-01-06 | Disposition: A | Discharge status: Home / Self Care | Payer: BLUE CROSS/BLUE SHIELD | Source: Ambulatory Visit | Attending: Radiation Oncology | Admitting: Radiation Oncology

## 2017-01-06 DIAGNOSIS — Z79811 Long term (current) use of aromatase inhibitors: Secondary | ICD-10-CM | POA: Diagnosis not present

## 2017-01-06 DIAGNOSIS — Z880 Allergy status to penicillin: Secondary | ICD-10-CM | POA: Diagnosis not present

## 2017-01-06 DIAGNOSIS — Z7984 Long term (current) use of oral hypoglycemic drugs: Secondary | ICD-10-CM | POA: Diagnosis not present

## 2017-01-06 DIAGNOSIS — Z882 Allergy status to sulfonamides status: Secondary | ICD-10-CM | POA: Diagnosis not present

## 2017-01-06 DIAGNOSIS — C50212 Malignant neoplasm of upper-inner quadrant of left female breast: Secondary | ICD-10-CM | POA: Diagnosis not present

## 2017-01-06 DIAGNOSIS — Z510 Encounter for antineoplastic radiation therapy: Secondary | ICD-10-CM | POA: Diagnosis not present

## 2017-01-06 DIAGNOSIS — Z79899 Other long term (current) drug therapy: Secondary | ICD-10-CM | POA: Diagnosis not present

## 2017-01-06 DIAGNOSIS — Z7951 Long term (current) use of inhaled steroids: Secondary | ICD-10-CM | POA: Diagnosis not present

## 2017-01-06 DIAGNOSIS — Z170 Estrogen receptor positive status [ER+]: Secondary | ICD-10-CM | POA: Diagnosis not present

## 2017-01-06 LAB — ECHOCARDIOGRAM COMPLETE
HEIGHTINCHES: 65 in
WEIGHTICAEL: 3638.4 [oz_av]

## 2017-01-07 ENCOUNTER — Ambulatory Visit: Payer: BLUE CROSS/BLUE SHIELD | Admitting: Radiation Oncology

## 2017-01-07 ENCOUNTER — Ambulatory Visit: Payer: BLUE CROSS/BLUE SHIELD

## 2017-01-08 ENCOUNTER — Ambulatory Visit
Admission: RE | Admit: 2017-01-08 | Discharge: 2017-01-08 | Disposition: A | Discharge status: Home / Self Care | Payer: BLUE CROSS/BLUE SHIELD | Source: Ambulatory Visit | Attending: Radiation Oncology | Admitting: Radiation Oncology

## 2017-01-08 ENCOUNTER — Other Ambulatory Visit (HOSPITAL_COMMUNITY): Payer: Self-pay

## 2017-01-08 ENCOUNTER — Ambulatory Visit: Payer: BLUE CROSS/BLUE SHIELD

## 2017-01-08 ENCOUNTER — Ambulatory Visit: Payer: BLUE CROSS/BLUE SHIELD | Admitting: Radiation Oncology

## 2017-01-08 DIAGNOSIS — C50212 Malignant neoplasm of upper-inner quadrant of left female breast: Secondary | ICD-10-CM | POA: Diagnosis not present

## 2017-01-08 DIAGNOSIS — Z7951 Long term (current) use of inhaled steroids: Secondary | ICD-10-CM | POA: Diagnosis not present

## 2017-01-08 DIAGNOSIS — Z880 Allergy status to penicillin: Secondary | ICD-10-CM | POA: Diagnosis not present

## 2017-01-08 DIAGNOSIS — Z79811 Long term (current) use of aromatase inhibitors: Secondary | ICD-10-CM | POA: Diagnosis not present

## 2017-01-08 DIAGNOSIS — Z882 Allergy status to sulfonamides status: Secondary | ICD-10-CM | POA: Diagnosis not present

## 2017-01-08 DIAGNOSIS — Z170 Estrogen receptor positive status [ER+]: Secondary | ICD-10-CM | POA: Diagnosis not present

## 2017-01-08 DIAGNOSIS — Z79899 Other long term (current) drug therapy: Secondary | ICD-10-CM | POA: Diagnosis not present

## 2017-01-08 DIAGNOSIS — Z510 Encounter for antineoplastic radiation therapy: Secondary | ICD-10-CM | POA: Diagnosis not present

## 2017-01-08 DIAGNOSIS — Z7984 Long term (current) use of oral hypoglycemic drugs: Secondary | ICD-10-CM | POA: Diagnosis not present

## 2017-01-09 ENCOUNTER — Ambulatory Visit: Payer: BLUE CROSS/BLUE SHIELD

## 2017-01-10 ENCOUNTER — Ambulatory Visit: Payer: BLUE CROSS/BLUE SHIELD

## 2017-01-11 ENCOUNTER — Ambulatory Visit: Payer: BLUE CROSS/BLUE SHIELD

## 2017-01-11 ENCOUNTER — Ambulatory Visit: Payer: BLUE CROSS/BLUE SHIELD | Admitting: Radiation Oncology

## 2017-01-11 ENCOUNTER — Ambulatory Visit
Admission: RE | Admit: 2017-01-11 | Discharge: 2017-01-11 | Disposition: A | Discharge status: Home / Self Care | Payer: BLUE CROSS/BLUE SHIELD | Source: Ambulatory Visit | Attending: Radiation Oncology | Admitting: Radiation Oncology

## 2017-01-11 DIAGNOSIS — Z79899 Other long term (current) drug therapy: Secondary | ICD-10-CM | POA: Diagnosis not present

## 2017-01-11 DIAGNOSIS — Z510 Encounter for antineoplastic radiation therapy: Secondary | ICD-10-CM | POA: Diagnosis not present

## 2017-01-11 DIAGNOSIS — Z79811 Long term (current) use of aromatase inhibitors: Secondary | ICD-10-CM | POA: Diagnosis not present

## 2017-01-11 DIAGNOSIS — Z7951 Long term (current) use of inhaled steroids: Secondary | ICD-10-CM | POA: Diagnosis not present

## 2017-01-11 DIAGNOSIS — Z170 Estrogen receptor positive status [ER+]: Secondary | ICD-10-CM | POA: Diagnosis not present

## 2017-01-11 DIAGNOSIS — Z880 Allergy status to penicillin: Secondary | ICD-10-CM | POA: Diagnosis not present

## 2017-01-11 DIAGNOSIS — C50212 Malignant neoplasm of upper-inner quadrant of left female breast: Secondary | ICD-10-CM | POA: Diagnosis not present

## 2017-01-11 DIAGNOSIS — Z882 Allergy status to sulfonamides status: Secondary | ICD-10-CM | POA: Diagnosis not present

## 2017-01-11 DIAGNOSIS — Z7984 Long term (current) use of oral hypoglycemic drugs: Secondary | ICD-10-CM | POA: Diagnosis not present

## 2017-01-12 ENCOUNTER — Ambulatory Visit: Payer: BLUE CROSS/BLUE SHIELD

## 2017-01-12 ENCOUNTER — Ambulatory Visit
Admission: RE | Admit: 2017-01-12 | Discharge: 2017-01-12 | Disposition: A | Discharge status: Home / Self Care | Payer: BLUE CROSS/BLUE SHIELD | Source: Ambulatory Visit | Attending: Radiation Oncology | Admitting: Radiation Oncology

## 2017-01-12 ENCOUNTER — Ambulatory Visit: Payer: Self-pay

## 2017-01-12 DIAGNOSIS — C50212 Malignant neoplasm of upper-inner quadrant of left female breast: Secondary | ICD-10-CM

## 2017-01-12 DIAGNOSIS — Z79899 Other long term (current) drug therapy: Secondary | ICD-10-CM | POA: Diagnosis not present

## 2017-01-12 DIAGNOSIS — Z7951 Long term (current) use of inhaled steroids: Secondary | ICD-10-CM | POA: Diagnosis not present

## 2017-01-12 DIAGNOSIS — Z882 Allergy status to sulfonamides status: Secondary | ICD-10-CM | POA: Diagnosis not present

## 2017-01-12 DIAGNOSIS — Z79811 Long term (current) use of aromatase inhibitors: Secondary | ICD-10-CM | POA: Diagnosis not present

## 2017-01-12 DIAGNOSIS — Z170 Estrogen receptor positive status [ER+]: Secondary | ICD-10-CM | POA: Diagnosis not present

## 2017-01-12 DIAGNOSIS — Z7984 Long term (current) use of oral hypoglycemic drugs: Secondary | ICD-10-CM | POA: Diagnosis not present

## 2017-01-12 DIAGNOSIS — Z880 Allergy status to penicillin: Secondary | ICD-10-CM | POA: Diagnosis not present

## 2017-01-12 DIAGNOSIS — Z510 Encounter for antineoplastic radiation therapy: Secondary | ICD-10-CM | POA: Diagnosis not present

## 2017-01-12 MED ORDER — RADIAPLEXRX EX GEL
Freq: Once | CUTANEOUS | Status: AC
  Administered 2017-01-12: 16:00:00 via TOPICAL

## 2017-01-12 NOTE — Progress Notes (Signed)
Patient Name: [name], HIBNER MRN: [id number] DOB: [birth year] Date: 2017-01-12 Verification Simulation and Treament Planning Note: Electron Diagnosis: C50.212 Malignant neoplasm of upper-inner quadrant of left female breast The patient was escorted to the [facility] and placed in the electron treatment position. The electron field position was confirmed clinically The electron field block design was clinically reviewed. SSD's were checked. Field and set up were approved for treatment. Monitor units will be calculated based on depth and verified.

## 2017-01-13 ENCOUNTER — Ambulatory Visit: Admission: RE | Admit: 2017-01-13 | Payer: BLUE CROSS/BLUE SHIELD | Source: Ambulatory Visit

## 2017-01-13 ENCOUNTER — Ambulatory Visit: Payer: BLUE CROSS/BLUE SHIELD

## 2017-01-14 ENCOUNTER — Ambulatory Visit: Payer: BLUE CROSS/BLUE SHIELD

## 2017-01-15 ENCOUNTER — Ambulatory Visit
Admission: RE | Admit: 2017-01-15 | Discharge: 2017-01-15 | Disposition: A | Discharge status: Home / Self Care | Payer: BLUE CROSS/BLUE SHIELD | Source: Ambulatory Visit | Attending: Radiation Oncology | Admitting: Radiation Oncology

## 2017-01-15 ENCOUNTER — Ambulatory Visit: Payer: BLUE CROSS/BLUE SHIELD

## 2017-01-15 DIAGNOSIS — Z170 Estrogen receptor positive status [ER+]: Secondary | ICD-10-CM | POA: Diagnosis not present

## 2017-01-15 DIAGNOSIS — Z79811 Long term (current) use of aromatase inhibitors: Secondary | ICD-10-CM | POA: Diagnosis not present

## 2017-01-15 DIAGNOSIS — Z510 Encounter for antineoplastic radiation therapy: Secondary | ICD-10-CM | POA: Diagnosis not present

## 2017-01-15 DIAGNOSIS — Z882 Allergy status to sulfonamides status: Secondary | ICD-10-CM | POA: Diagnosis not present

## 2017-01-15 DIAGNOSIS — Z880 Allergy status to penicillin: Secondary | ICD-10-CM | POA: Diagnosis not present

## 2017-01-15 DIAGNOSIS — C50212 Malignant neoplasm of upper-inner quadrant of left female breast: Secondary | ICD-10-CM | POA: Diagnosis not present

## 2017-01-15 DIAGNOSIS — Z7951 Long term (current) use of inhaled steroids: Secondary | ICD-10-CM | POA: Diagnosis not present

## 2017-01-15 DIAGNOSIS — Z79899 Other long term (current) drug therapy: Secondary | ICD-10-CM | POA: Diagnosis not present

## 2017-01-15 DIAGNOSIS — Z7984 Long term (current) use of oral hypoglycemic drugs: Secondary | ICD-10-CM | POA: Diagnosis not present

## 2017-01-18 ENCOUNTER — Ambulatory Visit: Payer: BLUE CROSS/BLUE SHIELD

## 2017-01-18 ENCOUNTER — Ambulatory Visit
Admission: RE | Admit: 2017-01-18 | Discharge: 2017-01-18 | Disposition: A | Discharge status: Home / Self Care | Payer: BLUE CROSS/BLUE SHIELD | Source: Ambulatory Visit | Attending: Radiation Oncology | Admitting: Radiation Oncology

## 2017-01-18 DIAGNOSIS — Z882 Allergy status to sulfonamides status: Secondary | ICD-10-CM | POA: Diagnosis not present

## 2017-01-18 DIAGNOSIS — Z170 Estrogen receptor positive status [ER+]: Secondary | ICD-10-CM | POA: Diagnosis not present

## 2017-01-18 DIAGNOSIS — Z7951 Long term (current) use of inhaled steroids: Secondary | ICD-10-CM | POA: Diagnosis not present

## 2017-01-18 DIAGNOSIS — Z880 Allergy status to penicillin: Secondary | ICD-10-CM | POA: Diagnosis not present

## 2017-01-18 DIAGNOSIS — Z7984 Long term (current) use of oral hypoglycemic drugs: Secondary | ICD-10-CM | POA: Diagnosis not present

## 2017-01-18 DIAGNOSIS — C50212 Malignant neoplasm of upper-inner quadrant of left female breast: Secondary | ICD-10-CM | POA: Diagnosis not present

## 2017-01-18 DIAGNOSIS — Z79899 Other long term (current) drug therapy: Secondary | ICD-10-CM | POA: Diagnosis not present

## 2017-01-18 DIAGNOSIS — Z510 Encounter for antineoplastic radiation therapy: Secondary | ICD-10-CM | POA: Diagnosis not present

## 2017-01-18 DIAGNOSIS — Z79811 Long term (current) use of aromatase inhibitors: Secondary | ICD-10-CM | POA: Diagnosis not present

## 2017-01-19 ENCOUNTER — Ambulatory Visit: Payer: BLUE CROSS/BLUE SHIELD

## 2017-01-19 ENCOUNTER — Ambulatory Visit
Admission: RE | Admit: 2017-01-19 | Discharge: 2017-01-19 | Disposition: A | Discharge status: Home / Self Care | Payer: BLUE CROSS/BLUE SHIELD | Source: Ambulatory Visit | Attending: Radiation Oncology | Admitting: Radiation Oncology

## 2017-01-19 DIAGNOSIS — C50212 Malignant neoplasm of upper-inner quadrant of left female breast: Secondary | ICD-10-CM | POA: Diagnosis not present

## 2017-01-19 DIAGNOSIS — Z882 Allergy status to sulfonamides status: Secondary | ICD-10-CM | POA: Diagnosis not present

## 2017-01-19 DIAGNOSIS — Z880 Allergy status to penicillin: Secondary | ICD-10-CM | POA: Diagnosis not present

## 2017-01-19 DIAGNOSIS — Z170 Estrogen receptor positive status [ER+]: Secondary | ICD-10-CM | POA: Diagnosis not present

## 2017-01-19 DIAGNOSIS — Z510 Encounter for antineoplastic radiation therapy: Secondary | ICD-10-CM | POA: Diagnosis not present

## 2017-01-19 DIAGNOSIS — Z79811 Long term (current) use of aromatase inhibitors: Secondary | ICD-10-CM | POA: Diagnosis not present

## 2017-01-19 DIAGNOSIS — Z79899 Other long term (current) drug therapy: Secondary | ICD-10-CM | POA: Diagnosis not present

## 2017-01-19 DIAGNOSIS — Z7951 Long term (current) use of inhaled steroids: Secondary | ICD-10-CM | POA: Diagnosis not present

## 2017-01-19 DIAGNOSIS — Z7984 Long term (current) use of oral hypoglycemic drugs: Secondary | ICD-10-CM | POA: Diagnosis not present

## 2017-01-19 MED ORDER — RADIAPLEXRX EX GEL
Freq: Once | CUTANEOUS | Status: AC
Start: 2017-01-19 — End: 2017-01-19
  Administered 2017-01-19: 17:00:00 via TOPICAL

## 2017-01-20 ENCOUNTER — Ambulatory Visit
Admission: RE | Admit: 2017-01-20 | Discharge: 2017-01-20 | Disposition: A | Discharge status: Home / Self Care | Payer: BLUE CROSS/BLUE SHIELD | Source: Ambulatory Visit | Attending: Radiation Oncology | Admitting: Radiation Oncology

## 2017-01-20 DIAGNOSIS — Z7951 Long term (current) use of inhaled steroids: Secondary | ICD-10-CM | POA: Diagnosis not present

## 2017-01-20 DIAGNOSIS — Z880 Allergy status to penicillin: Secondary | ICD-10-CM | POA: Diagnosis not present

## 2017-01-20 DIAGNOSIS — Z510 Encounter for antineoplastic radiation therapy: Secondary | ICD-10-CM | POA: Diagnosis not present

## 2017-01-20 DIAGNOSIS — Z170 Estrogen receptor positive status [ER+]: Secondary | ICD-10-CM | POA: Diagnosis not present

## 2017-01-20 DIAGNOSIS — Z7984 Long term (current) use of oral hypoglycemic drugs: Secondary | ICD-10-CM | POA: Diagnosis not present

## 2017-01-20 DIAGNOSIS — Z882 Allergy status to sulfonamides status: Secondary | ICD-10-CM | POA: Diagnosis not present

## 2017-01-20 DIAGNOSIS — Z79811 Long term (current) use of aromatase inhibitors: Secondary | ICD-10-CM | POA: Diagnosis not present

## 2017-01-20 DIAGNOSIS — Z79899 Other long term (current) drug therapy: Secondary | ICD-10-CM | POA: Diagnosis not present

## 2017-01-20 DIAGNOSIS — C50212 Malignant neoplasm of upper-inner quadrant of left female breast: Secondary | ICD-10-CM | POA: Diagnosis not present

## 2017-01-25 ENCOUNTER — Encounter: Payer: Self-pay | Admitting: Radiation Oncology

## 2017-01-25 NOTE — Progress Notes (Signed)
  Radiation Oncology         [phone] ________________________________  Name: [name] MRN: [id number]  Date: 01/25/2017  DOB: Jan 28, 1966  End of Treatment Note  Diagnosis:   Clinical stage I LeftBreast UIQ Invasive Ductal Carcinoma, ER(+)/ PR(-) / Her2(+), Grade 3, Pathological stage IIa T2 N0     Indication for treatment: Curative       Radiation treatment dates:   11/19/2016-01/20/2017    Site/dose:   1. Left breast, 50.4 Gy, 1.8 Gy in 28 fractions           2. Boost, 10 Gy in 5 fractions, 2.0 Gy per fraction  Beams/energy: 1. 3D, 10X       2. Electron, 15 E, 18 E  Narrative: The patient tolerated radiation treatment relatively well. At the start of radiation therapy.   She reported decreased energy level and decrease in appetite s/p chemotherapy. She denied any pain.  At the end of her treatment, she reported left nipple soreness and was given hydrogel pads. She also noted hyperpigmentation to left breast and fatigue. She has been using radiaplex. On physical exam, there was no moist desquamation.   Plan: The patient has completed radiation treatment. The patient will return to radiation oncology clinic for routine followup in one month. I advised them to call or return sooner if they have any questions or concerns related to their recovery or treatment.  -----------------------------------  Blair Promise, PhD, MD  This document serves as a record of services personally performed by Gery Pray, MD. It was created on his behalf by Marlowe Kays, a trained medical scribe. The creation of this record is based on the scribe's personal observations and the provider's statements to them. This document has been checked and approved by the attending provider.

## 2017-01-26 ENCOUNTER — Ambulatory Visit (HOSPITAL_BASED_OUTPATIENT_CLINIC_OR_DEPARTMENT_OTHER): Payer: BLUE CROSS/BLUE SHIELD

## 2017-01-26 ENCOUNTER — Other Ambulatory Visit: Payer: Self-pay

## 2017-01-26 ENCOUNTER — Other Ambulatory Visit (HOSPITAL_BASED_OUTPATIENT_CLINIC_OR_DEPARTMENT_OTHER): Payer: BLUE CROSS/BLUE SHIELD

## 2017-01-26 VITALS — BP 103/60 | HR 88 | Temp 98.1°F | Resp 20

## 2017-01-26 DIAGNOSIS — Z170 Estrogen receptor positive status [ER+]: Principal | ICD-10-CM

## 2017-01-26 DIAGNOSIS — C50212 Malignant neoplasm of upper-inner quadrant of left female breast: Secondary | ICD-10-CM

## 2017-01-26 DIAGNOSIS — Z5112 Encounter for antineoplastic immunotherapy: Secondary | ICD-10-CM

## 2017-01-26 DIAGNOSIS — G4733 Obstructive sleep apnea (adult) (pediatric): Secondary | ICD-10-CM | POA: Diagnosis not present

## 2017-01-26 LAB — CBC WITH DIFFERENTIAL/PLATELET
BASO%: 0.2 % (ref 0.0–2.0)
Basophils Absolute: 0 10*3/uL (ref 0.0–0.1)
EOS%: 2.4 % (ref 0.0–7.0)
Eosinophils Absolute: 0.1 10*3/uL (ref 0.0–0.5)
HCT: 40.5 % (ref 34.8–46.6)
HGB: 13.4 g/dL (ref 11.6–15.9)
LYMPH%: 13.8 % — AB (ref 14.0–49.7)
MCH: 32.5 pg (ref 25.1–34.0)
MCHC: 33.1 g/dL (ref 31.5–36.0)
MCV: 98.3 fL (ref 79.5–101.0)
MONO#: 0.4 10*3/uL (ref 0.1–0.9)
MONO%: 7 % (ref 0.0–14.0)
NEUT#: 3.8 10*3/uL (ref 1.5–6.5)
NEUT%: 76.6 % (ref 38.4–76.8)
PLATELETS: 131 10*3/uL — AB (ref 145–400)
RBC: 4.12 10*6/uL (ref 3.70–5.45)
RDW: 12.9 % (ref 11.2–14.5)
WBC: 5 10*3/uL (ref 3.9–10.3)
lymph#: 0.7 10*3/uL — ABNORMAL LOW (ref 0.9–3.3)

## 2017-01-26 LAB — COMPREHENSIVE METABOLIC PANEL
ALT: 14 U/L (ref 0–55)
ANION GAP: 9 meq/L (ref 3–11)
AST: 20 U/L (ref 5–34)
Albumin: 3.7 g/dL (ref 3.5–5.0)
Alkaline Phosphatase: 84 U/L (ref 40–150)
BUN: 21.1 mg/dL (ref 7.0–26.0)
CO2: 25 meq/L (ref 22–29)
CREATININE: 1.1 mg/dL (ref 0.6–1.1)
Calcium: 9.5 mg/dL (ref 8.4–10.4)
Chloride: 104 mEq/L (ref 98–109)
EGFR: >60 mL/min/{1.73_m2} (ref >60)
Glucose: 175 mg/dl — ABNORMAL HIGH (ref 70–140)
POTASSIUM: 3.8 meq/L (ref 3.5–5.1)
Sodium: 137 mEq/L (ref 136–145)
Total Bilirubin: 1.05 mg/dL (ref 0.20–1.20)
Total Protein: 8.3 g/dL (ref 6.4–8.3)

## 2017-01-26 MED ORDER — SODIUM CHLORIDE 0.9% FLUSH
10.0000 mL | INTRAVENOUS | Status: DC | PRN
  Administered 2017-01-26: 10 mL
  Filled 2017-01-26: qty 10

## 2017-01-26 MED ORDER — DIPHENHYDRAMINE HCL 25 MG PO CAPS
ORAL_CAPSULE | ORAL | Status: AC
Start: 2017-01-26 — End: 2017-01-26
  Filled 2017-01-26: qty 2

## 2017-01-26 MED ORDER — DIPHENHYDRAMINE HCL 25 MG PO CAPS
50.0000 mg | ORAL_CAPSULE | Freq: Once | ORAL | Status: AC
  Administered 2017-01-26: 50 mg via ORAL

## 2017-01-26 MED ORDER — FAMOTIDINE IN NACL 20-0.9 MG/50ML-% IV SOLN
INTRAVENOUS | Status: AC
  Filled 2017-01-26: qty 50

## 2017-01-26 MED ORDER — FAMOTIDINE IN NACL 20-0.9 MG/50ML-% IV SOLN
20.0000 mg | Freq: Once | INTRAVENOUS | Status: AC
  Administered 2017-01-26: 20 mg via INTRAVENOUS

## 2017-01-26 MED ORDER — ACETAMINOPHEN 325 MG PO TABS
650.0000 mg | ORAL_TABLET | Freq: Once | ORAL | Status: AC
  Administered 2017-01-26: 650 mg via ORAL

## 2017-01-26 MED ORDER — ACETAMINOPHEN 325 MG PO TABS
ORAL_TABLET | ORAL | Status: AC
  Filled 2017-01-26: qty 2

## 2017-01-26 MED ORDER — HEPARIN SOD (PORK) LOCK FLUSH 100 UNIT/ML IV SOLN
500.0000 [IU] | Freq: Once | INTRAVENOUS | Status: AC | PRN
  Administered 2017-01-26: 500 [IU]
  Filled 2017-01-26: qty 5

## 2017-01-26 MED ORDER — TRASTUZUMAB CHEMO 150 MG IV SOLR
600.0000 mg | Freq: Once | INTRAVENOUS | Status: AC
  Administered 2017-01-26: 600 mg via INTRAVENOUS
  Filled 2017-01-26: qty 28.57

## 2017-01-26 MED ORDER — SODIUM CHLORIDE 0.9 % IV SOLN
Freq: Once | INTRAVENOUS | Status: AC
  Administered 2017-01-26: 11:00:00 via INTRAVENOUS

## 2017-01-26 NOTE — Patient Instructions (Signed)
[facility] Discharge Instructions for Patients Receiving Chemotherapy  Today you received the following chemotherapy agents Herceptin  To help prevent nausea and vomiting after your treatment, we encourage you to take your nausea medication as directed.   If you develop nausea and vomiting that is not controlled by your nausea medication, call the clinic.   BELOW ARE SYMPTOMS THAT SHOULD BE REPORTED IMMEDIATELY:  *FEVER GREATER THAN 100.5 F  *CHILLS WITH OR WITHOUT FEVER  NAUSEA AND VOMITING THAT IS NOT CONTROLLED WITH YOUR NAUSEA MEDICATION  *UNUSUAL SHORTNESS OF BREATH  *UNUSUAL BRUISING OR BLEEDING  TENDERNESS IN MOUTH AND THROAT WITH OR WITHOUT PRESENCE OF ULCERS  *URINARY PROBLEMS  *BOWEL PROBLEMS  UNUSUAL RASH Items with * indicate a potential emergency and should be followed up as soon as possible.  Feel free to call the clinic should you have any questions or concerns. The clinic phone number is [phone].  Please show the CHEMO ALERT CARD at check-in to the Emergency Department and triage nurse.   

## 2017-01-29 ENCOUNTER — Ambulatory Visit: Payer: Self-pay | Admitting: Family

## 2017-01-29 VITALS — BP 130/90 | HR 86 | Temp 98.0°F

## 2017-01-29 DIAGNOSIS — R05 Cough: Secondary | ICD-10-CM

## 2017-01-29 DIAGNOSIS — R059 Cough, unspecified: Secondary | ICD-10-CM

## 2017-01-29 MED ORDER — HYDROCOD POLST-CPM POLST ER 10-8 MG/5ML PO SUER
5.0000 mL | Freq: Two times a day (BID) | ORAL | 0 refills | Status: DC | PRN
Start: 2017-01-29 — End: 2017-05-26

## 2017-01-29 MED ORDER — METOPROLOL TARTRATE 25 MG PO TABS: 25.0000 mg | ORAL_TABLET | Freq: Two times a day (BID) | ORAL | 0 refills | Status: DC

## 2017-01-29 NOTE — Progress Notes (Signed)
S/ complains of cough, sporadically and infrequently unrelieved by OTC medications. She gets relief with a dose of Tussionex that she received a year ago for acute bronchitis. She has multiple medical problems including allergy history ,pulmonary hypertension, and is currently receiving radiation for breast cancer. She is currently without a primary care physician and needs refills on her metoprolol. She denies chest pain ,shortness of breath, fever or chills or acute symptoms. O/: Alert pleasant in no acute distress, vital signs are stable, occasional dry cough noted ENT unremarkable, neck supple, heart RSR without murmur, lungs are clear  A/: Cough  P/: Rx Tussionex written and given. Refill for metoprolol sent. She is encouraged to get a new PCP.

## 2017-02-16 ENCOUNTER — Ambulatory Visit (HOSPITAL_BASED_OUTPATIENT_CLINIC_OR_DEPARTMENT_OTHER): Payer: BLUE CROSS/BLUE SHIELD

## 2017-02-16 ENCOUNTER — Other Ambulatory Visit (HOSPITAL_BASED_OUTPATIENT_CLINIC_OR_DEPARTMENT_OTHER): Payer: BLUE CROSS/BLUE SHIELD

## 2017-02-16 ENCOUNTER — Ambulatory Visit: Payer: BLUE CROSS/BLUE SHIELD

## 2017-02-16 ENCOUNTER — Ambulatory Visit (HOSPITAL_BASED_OUTPATIENT_CLINIC_OR_DEPARTMENT_OTHER): Payer: BLUE CROSS/BLUE SHIELD | Admitting: Hematology and Oncology

## 2017-02-16 ENCOUNTER — Telehealth: Payer: Self-pay | Admitting: Hematology and Oncology

## 2017-02-16 VITALS — BP 128/82 | HR 87 | Temp 98.0°F | Resp 17

## 2017-02-16 DIAGNOSIS — Z5112 Encounter for antineoplastic immunotherapy: Secondary | ICD-10-CM | POA: Diagnosis not present

## 2017-02-16 DIAGNOSIS — Z79811 Long term (current) use of aromatase inhibitors: Secondary | ICD-10-CM

## 2017-02-16 DIAGNOSIS — Z170 Estrogen receptor positive status [ER+]: Secondary | ICD-10-CM | POA: Diagnosis not present

## 2017-02-16 DIAGNOSIS — C50212 Malignant neoplasm of upper-inner quadrant of left female breast: Secondary | ICD-10-CM

## 2017-02-16 DIAGNOSIS — D259 Leiomyoma of uterus, unspecified: Secondary | ICD-10-CM

## 2017-02-16 DIAGNOSIS — Z95828 Presence of other vascular implants and grafts: Secondary | ICD-10-CM

## 2017-02-16 DIAGNOSIS — D649 Anemia, unspecified: Secondary | ICD-10-CM

## 2017-02-16 LAB — COMPREHENSIVE METABOLIC PANEL
ALT: 14 U/L (ref 0–55)
ANION GAP: 8 meq/L (ref 3–11)
AST: 17 U/L (ref 5–34)
Albumin: 3.5 g/dL (ref 3.5–5.0)
Alkaline Phosphatase: 81 U/L (ref 40–150)
BUN: 19.3 mg/dL (ref 7.0–26.0)
CALCIUM: 9.6 mg/dL (ref 8.4–10.4)
CHLORIDE: 106 meq/L (ref 98–109)
CO2: 25 mEq/L (ref 22–29)
CREATININE: 0.9 mg/dL (ref 0.6–1.1)
EGFR: >60 mL/min/{1.73_m2} (ref >60)
Glucose: 140 mg/dl (ref 70–140)
POTASSIUM: 4.2 meq/L (ref 3.5–5.1)
Sodium: 139 mEq/L (ref 136–145)
Total Bilirubin: 0.8 mg/dL (ref 0.20–1.20)
Total Protein: 8 g/dL (ref 6.4–8.3)

## 2017-02-16 LAB — CBC WITH DIFFERENTIAL/PLATELET
BASO%: 0.7 % (ref 0.0–2.0)
Basophils Absolute: 0 10*3/uL (ref 0.0–0.1)
EOS ABS: 0.2 10*3/uL (ref 0.0–0.5)
EOS%: 4.1 % (ref 0.0–7.0)
HCT: 38.7 % (ref 34.8–46.6)
HEMOGLOBIN: 12.9 g/dL (ref 11.6–15.9)
LYMPH%: 20.7 % (ref 14.0–49.7)
MCH: 32.5 pg (ref 25.1–34.0)
MCHC: 33.2 g/dL (ref 31.5–36.0)
MCV: 97.8 fL (ref 79.5–101.0)
MONO#: 0.4 10*3/uL (ref 0.1–0.9)
MONO%: 6.7 % (ref 0.0–14.0)
NEUT%: 67.8 % (ref 38.4–76.8)
NEUTROS ABS: 3.5 10*3/uL (ref 1.5–6.5)
Platelets: 169 10*3/uL (ref 145–400)
RBC: 3.96 10*6/uL (ref 3.70–5.45)
RDW: 14.5 % (ref 11.2–14.5)
WBC: 5.2 10*3/uL (ref 3.9–10.3)
lymph#: 1.1 10*3/uL (ref 0.9–3.3)

## 2017-02-16 MED ORDER — SODIUM CHLORIDE 0.9% FLUSH
10.0000 mL | Freq: Once | INTRAVENOUS | Status: AC
  Administered 2017-02-16: 10 mL
  Filled 2017-02-16: qty 10

## 2017-02-16 MED ORDER — HEPARIN SOD (PORK) LOCK FLUSH 100 UNIT/ML IV SOLN
500.0000 [IU] | Freq: Once | INTRAVENOUS | Status: AC | PRN
  Administered 2017-02-16: 500 [IU]
  Filled 2017-02-16: qty 5

## 2017-02-16 MED ORDER — TRASTUZUMAB CHEMO 150 MG IV SOLR
600.0000 mg | Freq: Once | INTRAVENOUS | Status: AC
  Administered 2017-02-16: 600 mg via INTRAVENOUS
  Filled 2017-02-16: qty 28.57

## 2017-02-16 MED ORDER — DIPHENHYDRAMINE HCL 25 MG PO CAPS
50.0000 mg | ORAL_CAPSULE | Freq: Once | ORAL | Status: AC
  Administered 2017-02-16: 50 mg via ORAL

## 2017-02-16 MED ORDER — ACETAMINOPHEN 325 MG PO TABS
650.0000 mg | ORAL_TABLET | Freq: Once | ORAL | Status: AC
  Administered 2017-02-16: 650 mg via ORAL

## 2017-02-16 MED ORDER — DIPHENHYDRAMINE HCL 25 MG PO CAPS
ORAL_CAPSULE | ORAL | Status: AC
  Filled 2017-02-16: qty 2

## 2017-02-16 MED ORDER — ACETAMINOPHEN 325 MG PO TABS
ORAL_TABLET | ORAL | Status: AC
  Filled 2017-02-16: qty 2

## 2017-02-16 MED ORDER — SODIUM CHLORIDE 0.9 % IV SOLN
Freq: Once | INTRAVENOUS | Status: AC
  Administered 2017-02-16: 12:00:00 via INTRAVENOUS

## 2017-02-16 MED ORDER — SODIUM CHLORIDE 0.9% FLUSH
10.0000 mL | INTRAVENOUS | Status: DC | PRN
  Administered 2017-02-16: 10 mL
  Filled 2017-02-16: qty 10

## 2017-02-16 MED ORDER — FAMOTIDINE IN NACL 20-0.9 MG/50ML-% IV SOLN
20.0000 mg | Freq: Once | INTRAVENOUS | Status: AC
  Administered 2017-02-16: 20 mg via INTRAVENOUS

## 2017-02-16 MED ORDER — FAMOTIDINE IN NACL 20-0.9 MG/50ML-% IV SOLN
INTRAVENOUS | Status: AC
  Filled 2017-02-16: qty 50

## 2017-02-16 NOTE — Progress Notes (Signed)
Patient Care Team: McLean-Scocuzza, Nino Glow, MD as PCP - General (Internal Medicine) Vonna Drafts, FNP as Nurse Practitioner (Nurse Practitioner) Stark Klein, MD as Consulting Physician (General Surgery) Nicholas Lose, MD as Consulting Physician (Hematology and Oncology) Gery Pray, MD as Consulting Physician (Radiation Oncology)  DIAGNOSIS:  Encounter Diagnosis  Name Primary?  . Malignant neoplasm of upper-inner quadrant of left breast in female, estrogen receptor positive ([place])     SUMMARY OF ONCOLOGIC HISTORY:   Malignant neoplasm of upper-inner quadrant of left breast in female, estrogen receptor positive ([place])   03/04/2016 Initial Diagnosis    Screening detected left breast density posterior medial 1.4 cm by ultrasound axilla negative, grade 3 IDC ER 60%, PR 0%, Ki-67 30%, HER-2 positive ratio 6.04, copy #16; T1 cN0 stage IA clinical stage      03/21/2016 Genetic Testing    NEgative genetic testing on the comprehensive cancer panel and Negative genetic testing for the MSH2 inversion analysis ([place] inversion). The Comprehensive Cancer Panel offered by [facility] includes sequencing and/or deletion duplication testing of the following 32 genes: APC, ATM, AXIN2, BARD1, BMPR1A, BRCA1, BRCA2, BRIP1, CDH1, CDK4, CDKN2A, CHEK2, EPCAM, FANCC, MLH1, MSH2, MSH6, MUTYH, NBN, PALB2, PMS2, POLD1, POLE, PTEN, RAD51C, RAD51D, SCG5/GREM1, SMAD4, STK11, TP53, VHL, and XRCC2.   The report date is March 21, 2016.      07/27/2016 Surgery    Left lumpectomy: IDC grade 2, 2.6 cm, DCIS intermediate grade,0/3 lymph nodes negative, ER 60%, PR 0%, Ki-67 30%, HER-2 positive ratio 6.04, T2 N0 stage II a      09/01/2016 - 11/03/2016 Chemotherapy    Abraxane Herceptin weekly 12 followed by Herceptin maintenance every 3 weeks       11/19/2016 -  Radiation Therapy    Adjuvant radiation therapy       CHIEF COMPLIANT: Patient is here for Herceptin maintenance therapy, tolerating anastrozole  well  INTERVAL HISTORY: [name] is a 51 year old with above-mentioned history of left breast cancer who underwent lumpectomy followed by adjuvant chemotherapy and radiation and is currently on Herceptin maintenance therapy.  She is tolerating Herceptin fairly well.  Her biggest complaint is decreased appetite and taste.  Otherwise she is doing quite well.  She has been tolerating anastrozole extremely well.  She denies any hot flashes or myalgias.  REVIEW OF SYSTEMS:   Constitutional: Denies fevers, chills or abnormal weight loss Eyes: Denies blurriness of vision Ears, nose, mouth, throat, and face: Denies mucositis or sore throat Respiratory: Denies cough, dyspnea or wheezes Cardiovascular: Denies palpitation, chest discomfort Gastrointestinal:  Denies nausea, heartburn or change in bowel habits Skin: Denies abnormal skin rashes Lymphatics: Denies new lymphadenopathy or easy bruising Neurological:Denies numbness, tingling or new weaknesses Behavioral/Psych: Mood is stable, no new changes  Extremities: No lower extremity edema Breast:  denies any pain or lumps or nodules in either breasts All other systems were reviewed with the patient and are negative.  I have reviewed the past medical history, past surgical history, social history and family history with the patient and they are unchanged from previous note.  ALLERGIES:  is allergic to amoxicillin; ampicillin; and sulfa antibiotics.  MEDICATIONS:  Current Outpatient Medications  Medication Sig Dispense Refill  . acetaminophen (TYLENOL) 500 MG tablet Take 1,000 mg by mouth every 6 (six) hours as needed for moderate pain or headache.     . albuterol (PROVENTIL HFA;VENTOLIN HFA) 108 (90 Base) MCG/ACT inhaler Inhale 2 puffs into the lungs every 6 (six) hours as needed for wheezing or  shortness of breath. 1 Inhaler 2  . anastrozole (ARIMIDEX) 1 MG tablet Take 1 mg by mouth daily.  1  . Azelastine-Fluticasone 137-50 MCG/ACT SUSP  Place 1 spray into the nose 2 (two) times daily. 23 g 5  . cetirizine (ZYRTEC) 10 MG tablet Take 1 tablet (10 mg total) by mouth at bedtime. (Patient taking differently: Take 10 mg by mouth daily. ) 30 tablet 5  . chlorpheniramine-HYDROcodone (TUSSIONEX PENNKINETIC ER) 10-8 MG/5ML SUER Take 5 mLs by mouth every 12 (twelve) hours as needed for cough. rx written and given to patient 100 mL 0  . fluticasone (FLONASE) 50 MCG/ACT nasal spray Place 2 sprays into both nostrils daily. (Patient taking differently: Place 2 sprays into both nostrils daily as needed for allergies. ) 16 g 5  . Fluticasone-Salmeterol (ADVAIR DISKUS) 250-50 MCG/DOSE AEPB Inhale 1 puff into the lungs 2 (two) times daily. 60 each 5  . glucose blood (BAYER CONTOUR TEST) test strip TEST twice a day    . hyaluronate sodium (RADIAPLEXRX) GEL Apply 1 application topically once.    . lidocaine-prilocaine (EMLA) cream Apply to affected area once 30 g 3  . losartan (COZAAR) 100 MG tablet Take 1 tablet (100 mg total) by mouth daily. (Patient taking differently: Take 50 mg by mouth daily. ) 30 tablet 0  . metFORMIN (GLUCOPHAGE) 500 MG tablet Take 1 tablet (500 mg total) by mouth 2 (two) times daily with a meal. 60 tablet 2  . metoprolol tartrate (LOPRESSOR) 25 MG tablet Take 1 tablet (25 mg total) by mouth 2 (two) times daily. 60 tablet 0  . non-metallic deodorant (ALRA) MISC Apply 1 application topically daily as needed.    . ondansetron (ZOFRAN) 8 MG tablet Take 1 tablet (8 mg total) by mouth 2 (two) times daily as needed for nausea or vomiting. 30 tablet 1  . OPSUMIT 10 MG TABS Take 10 mg by mouth daily.  8  . oxyCODONE (OXY IR/ROXICODONE) 5 MG immediate release tablet Take 1-2 tablets (5-10 mg total) by mouth every 4 (four) hours as needed for moderate pain. 30 tablet 0  . Polyethyl Glycol-Propyl Glycol (SYSTANE OP) Apply 1 drop to eye daily as needed (dry eyes).    . promethazine-codeine (PHENERGAN WITH CODEINE) 6.25-10 MG/5ML syrup Take  5 mLs by mouth every 4 (four) hours as needed for cough.   0  . sildenafil (REVATIO) 20 MG tablet Take 20 mg by mouth 3 (three) times daily.    Marland Kitchen spironolactone (ALDACTONE) 50 MG tablet Take 50 mg by mouth daily.     Marland Kitchen torsemide (DEMADEX) 20 MG tablet Take 20 mg by mouth 2 (two) times daily.    Marland Kitchen UPTRAVI 200 MCG TABS Take 200 mcg by mouth 2 (two) times daily.  10   No current facility-administered medications for this visit.     PHYSICAL EXAMINATION: ECOG PERFORMANCE STATUS: 1 - Symptomatic but completely ambulatory  Vitals:   02/16/17 1122  BP: (!) 118/56  Pulse: 95  Resp: 17  Temp: 98.6 F (37 C)  SpO2: 95%   Filed Weights   02/16/17 1122  Weight: 228 lb 8 oz (103.6 kg)    GENERAL:alert, no distress and comfortable SKIN: skin color, texture, turgor are normal, no rashes or significant lesions EYES: normal, Conjunctiva are pink and non-injected, sclera clear OROPHARYNX:no exudate, no erythema and lips, buccal mucosa, and tongue normal  NECK: supple, thyroid normal size, non-tender, without nodularity LYMPH:  no palpable lymphadenopathy in the cervical, axillary or inguinal  LUNGS: clear to auscultation and percussion with normal breathing effort HEART: regular rate & rhythm and no murmurs and no lower extremity edema ABDOMEN:abdomen soft, non-tender and normal bowel sounds MUSCULOSKELETAL:no cyanosis of digits and no clubbing  NEURO: alert & oriented x 3 with fluent speech, no focal motor/sensory deficits EXTREMITIES: No lower extremity edema BREAST: No palpable masses or nodules in either right or left breasts. No palpable axillary supraclavicular or infraclavicular adenopathy no breast tenderness or nipple discharge. (exam performed in the presence of a chaperone)  LABORATORY DATA:  I have reviewed the data as listed   Chemistry      Component Value Date/Time   NA 137 01/26/2017 0957   K 3.8 01/26/2017 0957   CL 103 07/28/2016 0335   CO2 25 01/26/2017 0957   BUN  21.1 01/26/2017 0957   CREATININE 1.1 01/26/2017 0957      Component Value Date/Time   CALCIUM 9.5 01/26/2017 0957   ALKPHOS 84 01/26/2017 0957   AST 20 01/26/2017 0957   ALT 14 01/26/2017 0957   BILITOT 1.05 01/26/2017 0957       Lab Results  Component Value Date   WBC 5.2 02/16/2017   HGB 12.9 02/16/2017   HCT 38.7 02/16/2017   MCV 97.8 02/16/2017   PLT 169 02/16/2017   NEUTROABS 3.5 02/16/2017    ASSESSMENT & PLAN:  Malignant neoplasm of upper-inner quadrant of left breast in female, estrogen receptor positive ([place]) Left lumpectomy 07/28/2016: IDC grade 2, 2.6 cm, DCIS intermediate grade,0/3 lymph nodes negative, ER 60%, PR 0%, Ki-67 30%, HER-2 positive ratio 6.04, T2 N0 stage II a  Treatment summary: 1. Adjuvant chemotherapy with Abraxane and Herceptin (cannot take Taxol because she could not receive steroids)weekly 12 followed by every 3 week Herceptin for one year until May 2019 2. followed by adjuvant radiation 11/25/2016-01/05/2017  3. Followed by adjuvant antiestrogen therapy started 01/28/2017 ------------------------------------------------------------------------------------------------------------------------------- Treatment plan: Adjuvant antiestrogen therapy with anastrozole 1 mg daily 01/28/2017  Anastrozole toxicities: Denies any hot flashes or myalgias.  Herceptin toxicities: Decreased appetite and taste Monitoring closely for toxicities Return to clinic every 3 weeks for Herceptin every 6 weeks for follow-up with me  I spent 25 minutes talking to the patient of which more than half was spent in counseling and coordination of care.  No orders of the defined types were placed in this encounter.  The patient has a good understanding of the overall plan. she agrees with it. she will call with any problems that may develop before the next visit here.   Rulon Eisenmenger, MD 02/16/17

## 2017-02-16 NOTE — Assessment & Plan Note (Signed)
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 Adjuvant chemotherapy with Abraxane and Herceptin (cannot take Taxol because she could not receive steroids)weekly 12 followed by every 3 week Herceptin for one year until May 2019 2. followed by adjuvant radiation 11/25/2016-01/05/2017  3. Followed by adjuvant antiestrogen therapy started 01/28/2017 ------------------------------------------------------------------------------------------------------------------------------- Treatment plan: Adjuvant antiestrogen therapy with anastrozole 1 mg daily 01/28/2017  Anastrozole toxicities:   Return to clinic every 3 weeks for Herceptin every 6 weeks for follow-up with me

## 2017-02-16 NOTE — Patient Instructions (Signed)
[facility] Discharge Instructions for Patients Receiving Chemotherapy  Today you received the following chemotherapy agents:  Herceptin (traxtuzumab)  To help prevent nausea and vomiting after your treatment, we encourage you to take your nausea medication as prescribed.   If you develop nausea and vomiting that is not controlled by your nausea medication, call the clinic.   BELOW ARE SYMPTOMS THAT SHOULD BE REPORTED IMMEDIATELY:  *FEVER GREATER THAN 100.5 F  *CHILLS WITH OR WITHOUT FEVER  NAUSEA AND VOMITING THAT IS NOT CONTROLLED WITH YOUR NAUSEA MEDICATION  *UNUSUAL SHORTNESS OF BREATH  *UNUSUAL BRUISING OR BLEEDING  TENDERNESS IN MOUTH AND THROAT WITH OR WITHOUT PRESENCE OF ULCERS  *URINARY PROBLEMS  *BOWEL PROBLEMS  UNUSUAL RASH Items with * indicate a potential emergency and should be followed up as soon as possible.  Feel free to call the clinic should you have any questions or concerns. The clinic phone number is [phone].  Please show the [place] at check-in to the Emergency Department and triage nurse.

## 2017-02-16 NOTE — Telephone Encounter (Signed)
Patient will receive schedule in treatment area. Patient scheduled per 11/20 los

## 2017-02-23 ENCOUNTER — Ambulatory Visit: Payer: Self-pay | Admitting: Internal Medicine

## 2017-02-26 DIAGNOSIS — G4733 Obstructive sleep apnea (adult) (pediatric): Secondary | ICD-10-CM | POA: Diagnosis not present

## 2017-03-02 ENCOUNTER — Telehealth: Payer: Self-pay | Admitting: Oncology

## 2017-03-02 NOTE — Telephone Encounter (Signed)
Sarah from [facility] called and asked for clearance for patient to have dental x-rays.  Advised her that it would be OK for patient to have dental x-rays.  She finished radiation 01/20/17 to her left breast.

## 2017-03-09 ENCOUNTER — Other Ambulatory Visit: Payer: Self-pay | Admitting: Hematology and Oncology

## 2017-03-09 ENCOUNTER — Ambulatory Visit (HOSPITAL_BASED_OUTPATIENT_CLINIC_OR_DEPARTMENT_OTHER): Payer: BLUE CROSS/BLUE SHIELD

## 2017-03-09 VITALS — Wt 235.5 lb

## 2017-03-09 DIAGNOSIS — C50212 Malignant neoplasm of upper-inner quadrant of left female breast: Secondary | ICD-10-CM | POA: Diagnosis not present

## 2017-03-09 DIAGNOSIS — Z5112 Encounter for antineoplastic immunotherapy: Secondary | ICD-10-CM | POA: Diagnosis not present

## 2017-03-09 DIAGNOSIS — Z170 Estrogen receptor positive status [ER+]: Principal | ICD-10-CM

## 2017-03-09 MED ORDER — SODIUM CHLORIDE 0.9 % IV SOLN
Freq: Once | INTRAVENOUS | Status: AC
  Administered 2017-03-09: 15:00:00 via INTRAVENOUS

## 2017-03-09 MED ORDER — FAMOTIDINE IN NACL 20-0.9 MG/50ML-% IV SOLN
INTRAVENOUS | Status: AC
  Filled 2017-03-09: qty 50

## 2017-03-09 MED ORDER — TRASTUZUMAB CHEMO 150 MG IV SOLR
600.0000 mg | Freq: Once | INTRAVENOUS | Status: AC
  Administered 2017-03-09: 600 mg via INTRAVENOUS
  Filled 2017-03-09: qty 28.57

## 2017-03-09 MED ORDER — DIPHENHYDRAMINE HCL 25 MG PO CAPS
50.0000 mg | ORAL_CAPSULE | Freq: Once | ORAL | Status: AC
Start: 2017-03-09 — End: 2017-03-09
  Administered 2017-03-09: 50 mg via ORAL

## 2017-03-09 MED ORDER — ACETAMINOPHEN 325 MG PO TABS
650.0000 mg | ORAL_TABLET | Freq: Once | ORAL | Status: AC
  Administered 2017-03-09: 650 mg via ORAL

## 2017-03-09 MED ORDER — SODIUM CHLORIDE 0.9% FLUSH
10.0000 mL | INTRAVENOUS | Status: DC | PRN
  Administered 2017-03-09: 10 mL
  Filled 2017-03-09: qty 10

## 2017-03-09 MED ORDER — DIPHENHYDRAMINE HCL 25 MG PO CAPS
ORAL_CAPSULE | ORAL | Status: AC
  Filled 2017-03-09: qty 2

## 2017-03-09 MED ORDER — FAMOTIDINE IN NACL 20-0.9 MG/50ML-% IV SOLN
20.0000 mg | Freq: Once | INTRAVENOUS | Status: AC
  Administered 2017-03-09: 20 mg via INTRAVENOUS

## 2017-03-09 MED ORDER — HEPARIN SOD (PORK) LOCK FLUSH 100 UNIT/ML IV SOLN
500.0000 [IU] | Freq: Once | INTRAVENOUS | Status: AC | PRN
  Administered 2017-03-09: 500 [IU]
  Filled 2017-03-09: qty 5

## 2017-03-09 MED ORDER — ACETAMINOPHEN 325 MG PO TABS
ORAL_TABLET | ORAL | Status: AC
  Filled 2017-03-09: qty 2

## 2017-03-09 NOTE — Patient Instructions (Signed)
[facility] Discharge Instructions for Patients Receiving Chemotherapy Today you received the following chemotherapy agents:  Herceptin To help prevent nausea and vomiting after your treatment, we encourage you to take your nausea medication as prescribed.   If you develop nausea and vomiting that is not controlled by your nausea medication, call the clinic.   BELOW ARE SYMPTOMS THAT SHOULD BE REPORTED IMMEDIATELY:  *FEVER GREATER THAN 100.5 F  *CHILLS WITH OR WITHOUT FEVER  NAUSEA AND VOMITING THAT IS NOT CONTROLLED WITH YOUR NAUSEA MEDICATION  *UNUSUAL SHORTNESS OF BREATH  *UNUSUAL BRUISING OR BLEEDING  TENDERNESS IN MOUTH AND THROAT WITH OR WITHOUT PRESENCE OF ULCERS  *URINARY PROBLEMS  *BOWEL PROBLEMS  UNUSUAL RASH Items with * indicate a potential emergency and should be followed up as soon as possible.  Feel free to call the clinic should you have any questions or concerns. The clinic phone number is [phone].  Please show the [place] at check-in to the Emergency Department and triage nurse.

## 2017-03-28 DIAGNOSIS — G4733 Obstructive sleep apnea (adult) (pediatric): Secondary | ICD-10-CM | POA: Diagnosis not present

## 2017-03-30 NOTE — Assessment & Plan Note (Signed)
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 Adjuvant chemotherapy with Abraxane and Herceptin (cannot take Taxol because she could not receive steroids)weekly 12 followed by every 3 week Herceptin for one year until May 2019 2. followed by adjuvant radiation08/29/2018-01/05/2017 3. Followed by adjuvant antiestrogen therapy started 01/28/2017 ------------------------------------------------------------------------------------------------------------------------------- Treatment plan: Adjuvant antiestrogen therapy withanastrozole 1 mg daily 01/28/2017  Anastrozole toxicities: Denies any hot flashes or myalgias.  Herceptin toxicities: Decreased appetite and taste Monitoring closely for toxicities Return to clinic every 3 weeks for Herceptin every 6 weeks for follow-up withme

## 2017-03-31 ENCOUNTER — Ambulatory Visit (HOSPITAL_BASED_OUTPATIENT_CLINIC_OR_DEPARTMENT_OTHER): Payer: BLUE CROSS/BLUE SHIELD | Admitting: Hematology and Oncology

## 2017-03-31 ENCOUNTER — Ambulatory Visit (HOSPITAL_BASED_OUTPATIENT_CLINIC_OR_DEPARTMENT_OTHER): Payer: BLUE CROSS/BLUE SHIELD

## 2017-03-31 ENCOUNTER — Other Ambulatory Visit (HOSPITAL_BASED_OUTPATIENT_CLINIC_OR_DEPARTMENT_OTHER): Payer: BLUE CROSS/BLUE SHIELD

## 2017-03-31 ENCOUNTER — Other Ambulatory Visit: Payer: Self-pay | Admitting: Hematology and Oncology

## 2017-03-31 ENCOUNTER — Telehealth: Payer: Self-pay | Admitting: Hematology and Oncology

## 2017-03-31 DIAGNOSIS — Z170 Estrogen receptor positive status [ER+]: Principal | ICD-10-CM

## 2017-03-31 DIAGNOSIS — C50212 Malignant neoplasm of upper-inner quadrant of left female breast: Secondary | ICD-10-CM

## 2017-03-31 DIAGNOSIS — Z5112 Encounter for antineoplastic immunotherapy: Secondary | ICD-10-CM

## 2017-03-31 DIAGNOSIS — Z95828 Presence of other vascular implants and grafts: Secondary | ICD-10-CM

## 2017-03-31 DIAGNOSIS — D649 Anemia, unspecified: Secondary | ICD-10-CM

## 2017-03-31 DIAGNOSIS — Z5189 Encounter for other specified aftercare: Secondary | ICD-10-CM | POA: Diagnosis not present

## 2017-03-31 DIAGNOSIS — D259 Leiomyoma of uterus, unspecified: Secondary | ICD-10-CM

## 2017-03-31 LAB — COMPREHENSIVE METABOLIC PANEL
ALBUMIN: 3.5 g/dL (ref 3.5–5.0)
ALK PHOS: 96 U/L (ref 40–150)
ALT: 14 U/L (ref 0–55)
AST: 16 U/L (ref 5–34)
Anion Gap: 8 mEq/L (ref 3–11)
BUN: 18.1 mg/dL (ref 7.0–26.0)
CO2: 26 meq/L (ref 22–29)
Calcium: 9.2 mg/dL (ref 8.4–10.4)
Chloride: 106 mEq/L (ref 98–109)
Creatinine: 1 mg/dL (ref 0.6–1.1)
EGFR: >60 mL/min/{1.73_m2} (ref >60)
GLUCOSE: 129 mg/dL (ref 70–140)
POTASSIUM: 3.6 meq/L (ref 3.5–5.1)
SODIUM: 139 meq/L (ref 136–145)
Total Bilirubin: 0.97 mg/dL (ref 0.20–1.20)
Total Protein: 7.8 g/dL (ref 6.4–8.3)

## 2017-03-31 LAB — CBC WITH DIFFERENTIAL/PLATELET
BASO%: 0.3 % (ref 0.0–2.0)
BASOS ABS: 0 10*3/uL (ref 0.0–0.1)
EOS ABS: 0.2 10*3/uL (ref 0.0–0.5)
EOS%: 3 % (ref 0.0–7.0)
HEMATOCRIT: 39.7 % (ref 34.8–46.6)
HGB: 13.4 g/dL (ref 11.6–15.9)
LYMPH#: 1.1 10*3/uL (ref 0.9–3.3)
LYMPH%: 16.6 % (ref 14.0–49.7)
MCH: 33.5 pg (ref 25.1–34.0)
MCHC: 33.8 g/dL (ref 31.5–36.0)
MCV: 99.3 fL (ref 79.5–101.0)
MONO#: 0.5 10*3/uL (ref 0.1–0.9)
MONO%: 8.1 % (ref 0.0–14.0)
NEUT#: 4.6 10*3/uL (ref 1.5–6.5)
NEUT%: 72 % (ref 38.4–76.8)
PLATELETS: 143 10*3/uL — AB (ref 145–400)
RBC: 4 10*6/uL (ref 3.70–5.45)
RDW: 14 % (ref 11.2–14.5)
WBC: 6.4 10*3/uL (ref 3.9–10.3)

## 2017-03-31 MED ORDER — SODIUM CHLORIDE 0.9 % IV SOLN
Freq: Once | INTRAVENOUS | Status: AC
  Administered 2017-03-31: 15:00:00 via INTRAVENOUS

## 2017-03-31 MED ORDER — ALTEPLASE 2 MG IJ SOLR
2.0000 mg | Freq: Once | INTRAMUSCULAR | Status: AC
  Administered 2017-03-31: 2 mg
  Filled 2017-03-31: qty 2

## 2017-03-31 MED ORDER — FAMOTIDINE IN NACL 20-0.9 MG/50ML-% IV SOLN
20.0000 mg | Freq: Once | INTRAVENOUS | Status: AC
  Administered 2017-03-31: 20 mg via INTRAVENOUS

## 2017-03-31 MED ORDER — FAMOTIDINE IN NACL 20-0.9 MG/50ML-% IV SOLN
INTRAVENOUS | Status: AC
  Filled 2017-03-31: qty 50

## 2017-03-31 MED ORDER — SODIUM CHLORIDE 0.9% FLUSH
10.0000 mL | Freq: Once | INTRAVENOUS | Status: AC
  Administered 2017-03-31: 10 mL
  Filled 2017-03-31: qty 10

## 2017-03-31 MED ORDER — DIPHENHYDRAMINE HCL 25 MG PO CAPS
50.0000 mg | ORAL_CAPSULE | Freq: Once | ORAL | Status: AC
  Administered 2017-03-31: 50 mg via ORAL

## 2017-03-31 MED ORDER — DIPHENHYDRAMINE HCL 25 MG PO CAPS
ORAL_CAPSULE | ORAL | Status: AC
  Filled 2017-03-31: qty 2

## 2017-03-31 MED ORDER — ACETAMINOPHEN 325 MG PO TABS
650.0000 mg | ORAL_TABLET | Freq: Once | ORAL | Status: AC
Start: 2017-03-31 — End: 2017-03-31
  Administered 2017-03-31: 650 mg via ORAL

## 2017-03-31 MED ORDER — ACETAMINOPHEN 325 MG PO TABS
ORAL_TABLET | ORAL | Status: AC
  Filled 2017-03-31: qty 2

## 2017-03-31 MED ORDER — HEPARIN SOD (PORK) LOCK FLUSH 100 UNIT/ML IV SOLN
500.0000 [IU] | Freq: Once | INTRAVENOUS | Status: AC | PRN
  Administered 2017-03-31: 500 [IU]
  Filled 2017-03-31: qty 5

## 2017-03-31 MED ORDER — TRASTUZUMAB CHEMO 150 MG IV SOLR
600.0000 mg | Freq: Once | INTRAVENOUS | Status: AC
  Administered 2017-03-31: 600 mg via INTRAVENOUS
  Filled 2017-03-31: qty 28.57

## 2017-03-31 MED ORDER — SODIUM CHLORIDE 0.9% FLUSH
10.0000 mL | INTRAVENOUS | Status: DC | PRN
  Administered 2017-03-31: 10 mL
  Filled 2017-03-31: qty 10

## 2017-03-31 NOTE — Progress Notes (Signed)
Patient Care Team: McLean-Scocuzza, Nino Glow, MD as PCP - General (Internal Medicine) Vonna Drafts, FNP as Nurse Practitioner (Nurse Practitioner) Stark Klein, MD as Consulting Physician (General Surgery) Nicholas Lose, MD as Consulting Physician (Hematology and Oncology) Gery Pray, MD as Consulting Physician (Radiation Oncology)  DIAGNOSIS:  Encounter Diagnosis  Name Primary?  . Malignant neoplasm of upper-inner quadrant of left breast in female, estrogen receptor positive ([place])     SUMMARY OF ONCOLOGIC HISTORY:   Malignant neoplasm of upper-inner quadrant of left breast in female, estrogen receptor positive ([place])   03/04/2016 Initial Diagnosis    Screening detected left breast density posterior medial 1.4 cm by ultrasound axilla negative, grade 3 IDC ER 60%, PR 0%, Ki-67 30%, HER-2 positive ratio 6.04, copy #16; T1 cN0 stage IA clinical stage      03/21/2016 Genetic Testing    NEgative genetic testing on the comprehensive cancer panel and Negative genetic testing for the MSH2 inversion analysis ([place] inversion). The Comprehensive Cancer Panel offered by [facility] includes sequencing and/or deletion duplication testing of the following 32 genes: APC, ATM, AXIN2, BARD1, BMPR1A, BRCA1, BRCA2, BRIP1, CDH1, CDK4, CDKN2A, CHEK2, EPCAM, FANCC, MLH1, MSH2, MSH6, MUTYH, NBN, PALB2, PMS2, POLD1, POLE, PTEN, RAD51C, RAD51D, SCG5/GREM1, SMAD4, STK11, TP53, VHL, and XRCC2.   The report date is March 21, 2016.      07/27/2016 Surgery    Left lumpectomy: IDC grade 2, 2.6 cm, DCIS intermediate grade,0/3 lymph nodes negative, ER 60%, PR 0%, Ki-67 30%, HER-2 positive ratio 6.04, T2 N0 stage II a      09/01/2016 - 11/03/2016 Chemotherapy    Abraxane Herceptin weekly 12 followed by Herceptin maintenance every 3 weeks       11/19/2016 -  Radiation Therapy    Adjuvant radiation therapy       CHIEF COMPLIANT: Follow-up on Herceptin maintenance along with anastrozole therapy.  INTERVAL  HISTORY: [name] is a 52 year old with above-mentioned history of left breast cancer currently on adjuvant Herceptin and anastrozole.  She appears to be tolerating both fairly well.  She does have mild nausea in the mornings of clear origin.  She denies any other symptoms.  She did denies any hot flashes or myalgias from anastrozole therapy.  She is currently working as a Education administrator at [employer] and appears to be enjoying her work.  REVIEW OF SYSTEMS:   Constitutional: Denies fevers, chills or abnormal weight loss Eyes: Denies blurriness of vision Ears, nose, mouth, throat, and face: Denies mucositis or sore throat Respiratory: Denies cough, dyspnea or wheezes Cardiovascular: Denies palpitation, chest discomfort Gastrointestinal:  Denies nausea, heartburn or change in bowel habits Skin: Denies abnormal skin rashes Lymphatics: Denies new lymphadenopathy or easy bruising Neurological:Denies numbness, tingling or new weaknesses Behavioral/Psych: Mood is stable, no new changes  Extremities: No lower extremity edema Breast: Complaining of left breast tenderness and swelling All other systems were reviewed with the patient and are negative.  I have reviewed the past medical history, past surgical history, social history and family history with the patient and they are unchanged from previous note.  ALLERGIES:  is allergic to amoxicillin; ampicillin; and sulfa antibiotics.  MEDICATIONS:  Current Outpatient Medications  Medication Sig Dispense Refill  . acetaminophen (TYLENOL) 500 MG tablet Take 1,000 mg by mouth every 6 (six) hours as needed for moderate pain or headache.     . albuterol (PROVENTIL HFA;VENTOLIN HFA) 108 (90 Base) MCG/ACT inhaler Inhale 2 puffs into the lungs every 6 (six) hours as needed  for wheezing or shortness of breath. 1 Inhaler 2  . anastrozole (ARIMIDEX) 1 MG tablet Take 1 mg by mouth daily.  1  . Azelastine-Fluticasone 137-50 MCG/ACT SUSP Place 1 spray  into the nose 2 (two) times daily. 23 g 5  . cetirizine (ZYRTEC) 10 MG tablet Take 1 tablet (10 mg total) by mouth at bedtime. (Patient taking differently: Take 10 mg by mouth daily. ) 30 tablet 5  . chlorpheniramine-HYDROcodone (TUSSIONEX PENNKINETIC ER) 10-8 MG/5ML SUER Take 5 mLs by mouth every 12 (twelve) hours as needed for cough. rx written and given to patient 100 mL 0  . fluticasone (FLONASE) 50 MCG/ACT nasal spray Place 2 sprays into both nostrils daily. (Patient taking differently: Place 2 sprays into both nostrils daily as needed for allergies. ) 16 g 5  . Fluticasone-Salmeterol (ADVAIR DISKUS) 250-50 MCG/DOSE AEPB Inhale 1 puff into the lungs 2 (two) times daily. 60 each 5  . glucose blood (BAYER CONTOUR TEST) test strip TEST twice a day    . hyaluronate sodium (RADIAPLEXRX) GEL Apply 1 application topically once.    . lidocaine-prilocaine (EMLA) cream Apply to affected area once 30 g 3  . losartan (COZAAR) 100 MG tablet Take 1 tablet (100 mg total) by mouth daily. (Patient taking differently: Take 50 mg by mouth daily. ) 30 tablet 0  . metFORMIN (GLUCOPHAGE) 500 MG tablet Take 1 tablet (500 mg total) by mouth 2 (two) times daily with a meal. 60 tablet 2  . metoprolol tartrate (LOPRESSOR) 25 MG tablet Take 1 tablet (25 mg total) by mouth 2 (two) times daily. 60 tablet 0  . non-metallic deodorant (ALRA) MISC Apply 1 application topically daily as needed.    . ondansetron (ZOFRAN) 8 MG tablet Take 1 tablet (8 mg total) by mouth 2 (two) times daily as needed for nausea or vomiting. 30 tablet 1  . OPSUMIT 10 MG TABS Take 10 mg by mouth daily.  8  . oxyCODONE (OXY IR/ROXICODONE) 5 MG immediate release tablet Take 1-2 tablets (5-10 mg total) by mouth every 4 (four) hours as needed for moderate pain. 30 tablet 0  . Polyethyl Glycol-Propyl Glycol (SYSTANE OP) Apply 1 drop to eye daily as needed (dry eyes).    . promethazine-codeine (PHENERGAN WITH CODEINE) 6.25-10 MG/5ML syrup Take 5 mLs by mouth  every 4 (four) hours as needed for cough.   0  . sildenafil (REVATIO) 20 MG tablet Take 20 mg by mouth 3 (three) times daily.    Marland Kitchen spironolactone (ALDACTONE) 50 MG tablet Take 50 mg by mouth daily.     Marland Kitchen torsemide (DEMADEX) 20 MG tablet Take 20 mg by mouth 2 (two) times daily.    Marland Kitchen UPTRAVI 200 MCG TABS Take 200 mcg by mouth 2 (two) times daily.  10   No current facility-administered medications for this visit.     PHYSICAL EXAMINATION: ECOG PERFORMANCE STATUS: 1 - Symptomatic but completely ambulatory  There were no vitals filed for this visit. There were no vitals filed for this visit.  GENERAL:alert, no distress and comfortable SKIN: skin color, texture, turgor are normal, no rashes or significant lesions EYES: normal, Conjunctiva are pink and non-injected, sclera clear OROPHARYNX:no exudate, no erythema and lips, buccal mucosa, and tongue normal  NECK: supple, thyroid normal size, non-tender, without nodularity LYMPH:  no palpable lymphadenopathy in the cervical, axillary or inguinal LUNGS: clear to auscultation and percussion with normal breathing effort HEART: regular rate & rhythm and no murmurs and no lower extremity edema  ABDOMEN:abdomen soft, non-tender and normal bowel sounds MUSCULOSKELETAL:no cyanosis of digits and no clubbing  NEURO: alert & oriented x 3 with fluent speech, no focal motor/sensory deficits EXTREMITIES: No lower extremity edema BREAST left breast pain (exam performed in the presence of a chaperone)  LABORATORY DATA:  I have reviewed the data as listed   Chemistry      Component Value Date/Time   NA 139 02/16/2017 1053   K 4.2 02/16/2017 1053   CL 103 07/28/2016 0335   CO2 25 02/16/2017 1053   BUN 19.3 02/16/2017 1053   CREATININE 0.9 02/16/2017 1053      Component Value Date/Time   CALCIUM 9.6 02/16/2017 1053   ALKPHOS 81 02/16/2017 1053   AST 17 02/16/2017 1053   ALT 14 02/16/2017 1053   BILITOT 0.80 02/16/2017 1053       Lab Results    Component Value Date   WBC 5.2 02/16/2017   HGB 12.9 02/16/2017   HCT 38.7 02/16/2017   MCV 97.8 02/16/2017   PLT 169 02/16/2017   NEUTROABS 3.5 02/16/2017    ASSESSMENT & PLAN:  Malignant neoplasm of upper-inner quadrant of left breast in female, estrogen receptor positive ([place]) Left lumpectomy 07/28/2016: IDC grade 2, 2.6 cm, DCIS intermediate grade,0/3 lymph nodes negative, ER 60%, PR 0%, Ki-67 30%, HER-2 positive ratio 6.04, T2 N0 stage II a  Treatment summary: 1. Adjuvant chemotherapy with Abraxane and Herceptin (cannot take Taxol because she could not receive steroids)weekly 12 followed by every 3 week Herceptin for one year until May 2019 2. followed by adjuvant radiation08/29/2018-01/05/2017 3. Followed by adjuvant antiestrogen therapy started 01/28/2017 ------------------------------------------------------------------------------------------------------------------------------- Treatment plan: Adjuvant antiestrogen therapy withanastrozole 1 mg daily 01/28/2017  Anastrozole toxicities: Denies any hot flashes or myalgias.  Herceptin toxicities: Decreased appetite and taste  Left breast swelling: I discussed with her that she may have some lymphedema.  If it gets worse she will call me so that we can arrange a physical therapy follow-up.  Monitoring closely for toxicities Return to clinic every 3 weeks for Herceptin every 6 weeks for follow-up withme  I spent 25 minutes talking to the patient of which more than half was spent in counseling and coordination of care.  No orders of the defined types were placed in this encounter.  The patient has a good understanding of the overall plan. she agrees with it. she will call with any problems that may develop before the next visit here.   Harriette Ohara, MD 03/31/17

## 2017-03-31 NOTE — Telephone Encounter (Signed)
Gave patient avs report and appointments for January thru March.

## 2017-03-31 NOTE — Progress Notes (Signed)
Port-A-Cath accessed, no blood return noted. Site flushes well with NS. No swelling or pain noted upon flushing. Cath-Flo instilled per policy. Will monitor for blood return

## 2017-03-31 NOTE — Patient Instructions (Signed)

## 2017-03-31 NOTE — Patient Instructions (Signed)
[facility] Discharge Instructions for Patients Receiving Chemotherapy Today you received the following chemotherapy agents:  Herceptin To help prevent nausea and vomiting after your treatment, we encourage you to take your nausea medication as prescribed.   If you develop nausea and vomiting that is not controlled by your nausea medication, call the clinic.   BELOW ARE SYMPTOMS THAT SHOULD BE REPORTED IMMEDIATELY:  *FEVER GREATER THAN 100.5 F  *CHILLS WITH OR WITHOUT FEVER  NAUSEA AND VOMITING THAT IS NOT CONTROLLED WITH YOUR NAUSEA MEDICATION  *UNUSUAL SHORTNESS OF BREATH  *UNUSUAL BRUISING OR BLEEDING  TENDERNESS IN MOUTH AND THROAT WITH OR WITHOUT PRESENCE OF ULCERS  *URINARY PROBLEMS  *BOWEL PROBLEMS  UNUSUAL RASH Items with * indicate a potential emergency and should be followed up as soon as possible.  Feel free to call the clinic should you have any questions or concerns. The clinic phone number is [phone].  Please show the CHEMO ALERT CARD at check-in to the Emergency Department and triage nurse.   

## 2017-04-14 DIAGNOSIS — Z6837 Body mass index (BMI) 37.0-37.9, adult: Secondary | ICD-10-CM | POA: Diagnosis not present

## 2017-04-14 DIAGNOSIS — Z01419 Encounter for gynecological examination (general) (routine) without abnormal findings: Secondary | ICD-10-CM | POA: Diagnosis not present

## 2017-04-20 ENCOUNTER — Inpatient Hospital Stay: Payer: BLUE CROSS/BLUE SHIELD | Attending: Hematology and Oncology

## 2017-04-20 VITALS — BP 103/69 | HR 86 | Temp 98.7°F | Resp 18

## 2017-04-20 DIAGNOSIS — C50212 Malignant neoplasm of upper-inner quadrant of left female breast: Secondary | ICD-10-CM | POA: Insufficient documentation

## 2017-04-20 DIAGNOSIS — Z5112 Encounter for antineoplastic immunotherapy: Secondary | ICD-10-CM | POA: Insufficient documentation

## 2017-04-20 DIAGNOSIS — Z170 Estrogen receptor positive status [ER+]: Secondary | ICD-10-CM

## 2017-04-20 MED ORDER — SODIUM CHLORIDE 0.9% FLUSH
10.0000 mL | INTRAVENOUS | Status: DC | PRN
  Administered 2017-04-20: 10 mL
  Filled 2017-04-20: qty 10

## 2017-04-20 MED ORDER — TRASTUZUMAB CHEMO 150 MG IV SOLR
600.0000 mg | Freq: Once | INTRAVENOUS | Status: AC
  Administered 2017-04-20: 600 mg via INTRAVENOUS
  Filled 2017-04-20: qty 28.57

## 2017-04-20 MED ORDER — DIPHENHYDRAMINE HCL 25 MG PO CAPS
50.0000 mg | ORAL_CAPSULE | Freq: Once | ORAL | Status: AC
  Administered 2017-04-20: 50 mg via ORAL

## 2017-04-20 MED ORDER — FAMOTIDINE IN NACL 20-0.9 MG/50ML-% IV SOLN
INTRAVENOUS | Status: AC
  Filled 2017-04-20: qty 50

## 2017-04-20 MED ORDER — FAMOTIDINE IN NACL 20-0.9 MG/50ML-% IV SOLN
20.0000 mg | Freq: Once | INTRAVENOUS | Status: AC
  Administered 2017-04-20: 20 mg via INTRAVENOUS

## 2017-04-20 MED ORDER — ALTEPLASE 2 MG IJ SOLR
2.0000 mg | Freq: Once | INTRAMUSCULAR | Status: AC | PRN
  Administered 2017-04-20: 2 mg
  Filled 2017-04-20: qty 2

## 2017-04-20 MED ORDER — ALTEPLASE 2 MG IJ SOLR
INTRAMUSCULAR | Status: AC
  Filled 2017-04-20: qty 2

## 2017-04-20 MED ORDER — ACETAMINOPHEN 325 MG PO TABS
ORAL_TABLET | ORAL | Status: AC
  Filled 2017-04-20: qty 2

## 2017-04-20 MED ORDER — HEPARIN SOD (PORK) LOCK FLUSH 100 UNIT/ML IV SOLN
500.0000 [IU] | Freq: Once | INTRAVENOUS | Status: AC | PRN
  Administered 2017-04-20: 500 [IU]
  Filled 2017-04-20: qty 5

## 2017-04-20 MED ORDER — SODIUM CHLORIDE 0.9 % IV SOLN
Freq: Once | INTRAVENOUS | Status: AC
  Administered 2017-04-20: 14:00:00 via INTRAVENOUS

## 2017-04-20 MED ORDER — ACETAMINOPHEN 325 MG PO TABS
650.0000 mg | ORAL_TABLET | Freq: Once | ORAL | Status: AC
  Administered 2017-04-20: 650 mg via ORAL

## 2017-04-20 MED ORDER — DIPHENHYDRAMINE HCL 25 MG PO CAPS
ORAL_CAPSULE | ORAL | Status: AC
  Filled 2017-04-20: qty 2

## 2017-04-20 NOTE — Progress Notes (Signed)
Was notified by Infusion RN that pt unable to get blood return from pac, even after cathflo administered. Pt has another appointment at 3pm and refused to have IV peripherally accessed or to have a new port re access at this time. Pt wants to leave and reschedule since she is getting close to her next appt elsewhere.  Will notify Dr.Gudena and discuss a dye study since this has occurred several times the last previous pac access.

## 2017-04-20 NOTE — Patient Instructions (Signed)
[facility] Discharge Instructions for Patients Receiving Chemotherapy  Today you received the following chemotherapy agents Herceptin  To help prevent nausea and vomiting after your treatment, we encourage you to take your nausea medication as directed.   If you develop nausea and vomiting that is not controlled by your nausea medication, call the clinic.   BELOW ARE SYMPTOMS THAT SHOULD BE REPORTED IMMEDIATELY:  *FEVER GREATER THAN 100.5 F  *CHILLS WITH OR WITHOUT FEVER  NAUSEA AND VOMITING THAT IS NOT CONTROLLED WITH YOUR NAUSEA MEDICATION  *UNUSUAL SHORTNESS OF BREATH  *UNUSUAL BRUISING OR BLEEDING  TENDERNESS IN MOUTH AND THROAT WITH OR WITHOUT PRESENCE OF ULCERS  *URINARY PROBLEMS  *BOWEL PROBLEMS  UNUSUAL RASH Items with * indicate a potential emergency and should be followed up as soon as possible.  Feel free to call the clinic should you have any questions or concerns. The clinic phone number is [phone].  Please show the [place] at check-in to the Emergency Department and triage nurse.

## 2017-04-27 ENCOUNTER — Encounter (HOSPITAL_COMMUNITY): Payer: Self-pay | Admitting: Emergency Medicine

## 2017-04-27 ENCOUNTER — Other Ambulatory Visit: Payer: Self-pay

## 2017-04-27 ENCOUNTER — Telehealth (HOSPITAL_COMMUNITY): Payer: Self-pay | Admitting: Emergency Medicine

## 2017-04-27 ENCOUNTER — Ambulatory Visit (HOSPITAL_COMMUNITY)
Admission: EM | Admit: 2017-04-27 | Discharge: 2017-04-27 | Disposition: A | Discharge status: Home / Self Care | Payer: BLUE CROSS/BLUE SHIELD | Attending: Family Medicine | Admitting: Family Medicine

## 2017-04-27 DIAGNOSIS — M109 Gout, unspecified: Secondary | ICD-10-CM | POA: Diagnosis not present

## 2017-04-27 MED ORDER — COLCHICINE 0.6 MG PO TABS: ORAL_TABLET | ORAL | 0 refills | Status: DC

## 2017-04-27 MED ORDER — INDOMETHACIN 50 MG PO CAPS: ORAL_CAPSULE | ORAL | 0 refills | Status: AC

## 2017-04-27 NOTE — Telephone Encounter (Signed)
Pt called stating she is not feeling any better from her visit here  Was treated w/indmethacin 50 mg w/no relief.   Wants to know if we can give her anything else  Per Dr. Mannie Stabile, ok to call in colchicine 0.6 mg, take 2 tab now and take additional tab in 1 hour if sx are not improving #3 no refills.   Rx sent to [facility] ([address]) per pt's request.

## 2017-04-27 NOTE — Discharge Instructions (Signed)
Drink plenty of fluids. Ice and elevate. Stop motrin. May take first dose of medication prescribed in approximately 3 hours. If symptoms worsen or do not improve in the next week to return to be seen or to follow up with your PCP.

## 2017-04-27 NOTE — ED Provider Notes (Signed)
[facility]    CSN: [id number] Arrival date & time: 04/27/17  1205     History   Chief Complaint Chief Complaint  Patient presents with  . Foot Pain    left    HPI [name] is a 52 y.o. female.   [name] presents with complaints of left foot great toe pain which started two days ago. States she feels this is gout as she has realized she had this one other time. Pain with walking. 10/10. Took motrin today which did not help. Without numbness or tingling. Without fevers. Without open skin. No known injury. Has increased her water intake.  Patient is receiving treatment for breast cancer. Pre diabetic. History of htn, dvt, pe, osa. No known kidney disease.   ROS per HPI.       Past Medical History:  Diagnosis Date  . Anemia    iron deficiency  . Borderline diabetes   . Complication of anesthesia    woke up slowly- after hysterectomy- 2015  . Diabetes mellitus, type II ([place])   . DVT (deep venous thrombosis) ([place]) 2014   left leg  . Family history of breast cancer   . HTN (hypertension)   . 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 Menorrhagia    secondary to uterine fibroids  . OSA (obstructive sleep apnea)    07/25/13 HST AHI 33/hr, severe hypoxemia O2 min 42% and 95% of the time <89%  . PE (pulmonary embolism) 2014   bilateral  . Pulmonary artery hypertension ([place])   . Pulmonary nodule    (32m on loeft lower lobe) found on CT scan July 2014, repeat scan Jan 2015 showed less than 498m . S/P TAH (total abdominal hysterectomy) 06/07/2013    Patient Active Problem List   Diagnosis Date Noted  . Chemotherapy-induced peripheral neuropathy ([facility]08/09/2016  . Port catheter in place 09/15/2016  . Breast cancer of upper-inner quadrant of left female breast ([facility]04/30/2018  . Genetic testing 03/25/2016  . Family history of breast cancer   . Malignant neoplasm of upper-inner quadrant of left breast in female,  estrogen receptor positive ([facility]12/10/2015  . Acute on chronic respiratory failure ([facility]11/05/2015  . Dyspnea on exertion 12/09/2015  . S/P TAH (total abdominal hysterectomy) 06/07/2013  . Acute blood loss anemia 05/14/2013  . Abnormal uterine bleeding 10/13/2012  . Fibroids 10/13/2012  . Anemia 10/13/2012  . VTE (venous thromboembolism) 10/04/2012  . Pulmonary nodule 10/04/2012  . Hypokalemia 10/04/2012    Past Surgical History:  Procedure Laterality Date  . ABDOMINAL HYSTERECTOMY N/A 06/07/2013   Procedure: HYSTERECTOMY ABDOMINAL WITH BIALTERAL SALPINGECTOMY;  Surgeon: VaElveria RoyalsMD;  [facility];  Service: Gynecology;  Laterality: N/A;  . BREAST LUMPECTOMY Left 07/27/2016   BREAST LUMPECTOMY WITH RADIOACTIVE SEED AND SENTINEL LYMPH NODE BIOPSY (Left)  . BREAST LUMPECTOMY WITH RADIOACTIVE SEED AND SENTINEL LYMPH NODE BIOPSY Left 07/27/2016   Procedure: BREAST LUMPECTOMY WITH RADIOACTIVE SEED AND SENTINEL LYMPH NODE BIOPSY;  Surgeon: FaStark KleinMD;  [facility] Service: General;  Laterality: Left;  . CARDIAC CATHETERIZATION N/A 12/24/2015   Procedure: Right Heart Cath;  Surgeon: JaAdrian ProwsMD;  [facility];  Service: Cardiovascular;  Laterality: N/A;  . CESAREAN SECTION    . POLYPECTOMY  2008   Removal of uterine polyp  . PORTA CATH INSERTION  07/27/2016  . PORTACATH PLACEMENT Right 07/27/2016   Procedure: INSERTION PORT-A-CATH;  Surgeon:  Stark Klein, MD;  Location: [facility];  Service: General;  Laterality: Right;    OB History    Gravida Para Term Preterm AB Living   _0 0 2 1   SAB TAB Ectopic Multiple Live Births   1 1 0 0         Home Medications    Prior to Admission medications   Medication Sig Start Date End Date Taking? Authorizing Provider  anastrozole (ARIMIDEX) 1 MG tablet Take 1 mg by mouth daily. 06/27/16  Yes [provider]  hyaluronate sodium (RADIAPLEXRX) GEL Apply 1 application topically once.   Yes [provider]  ibuprofen (ADVIL,MOTRIN) 800 MG tablet Take 800 mg by mouth every 8 (eight) hours as needed for moderate pain.   Yes [provider]  lidocaine-prilocaine (EMLA) cream Apply to affected area once 11/23/16  Yes Nicholas Lose, MD  losartan (COZAAR) 100 MG tablet Take 1 tablet (100 mg total) by mouth daily. Patient taking differently: Take 50 mg by mouth daily.  03/29/15  Yes Blima Singer, NP  metFORMIN (GLUCOPHAGE) 500 MG tablet Take 1 tablet (500 mg total) by mouth 2 (two) times daily with a meal. 02/03/16  Yes Demetrios Loll, MD  metoprolol tartrate (LOPRESSOR) 25 MG tablet Take 1 tablet (25 mg total) by mouth 2 (two) times daily. 01/29/17  Yes Blima Singer, NP  OPSUMIT 10 MG TABS Take 10 mg by mouth daily. 06/03/16  Yes [provider]  Polyethyl Glycol-Propyl Glycol (SYSTANE OP) Apply 1 drop to eye daily as needed (dry eyes).   Yes [provider]  sildenafil (REVATIO) 20 MG tablet Take 20 mg by mouth 3 (three) times daily. 05/27/16  Yes [provider]  spironolactone (ALDACTONE) 50 MG tablet Take 50 mg by mouth daily.  10/11/15  Yes [provider]  torsemide (DEMADEX) 20 MG tablet Take 20 mg by mouth 2 (two) times daily.   Yes [provider]  UPTRAVI 200 MCG TABS Take 200 mcg by mouth 2 (two) times daily. 06/10/16  Yes [provider]  acetaminophen (TYLENOL) 500 MG tablet Take 1,000 mg by mouth every 6 (six) hours as needed for moderate pain or headache.     [provider]  albuterol (PROVENTIL HFA;VENTOLIN HFA) 108 (90 Base) MCG/ACT inhaler Inhale 2 puffs into the lungs every 6 (six) hours as needed for wheezing or shortness of breath. 06/06/15   Caryn Section Linden Dolin, PA-C  Azelastine-Fluticasone [phone] MCG/ACT SUSP Place 1 spray into the nose 2 (two) times daily. 12/19/15   Mannam, [place] Robinsons, MD  chlorpheniramine-HYDROcodone (TUSSIONEX PENNKINETIC ER) 10-8 MG/5ML SUER Take 5 mLs by mouth every 12 (twelve) hours as  needed for cough. rx written and given to patient 01/29/17   Blima Singer, NP  fluticasone [facility]) 50 MCG/ACT nasal spray Place 2 sprays into both nostrils daily. Patient taking differently: Place 2 sprays into both nostrils daily as needed for allergies.  01/15/15   Mannam, [place] Robinsons, MD  Fluticasone-Salmeterol (ADVAIR DISKUS) 250-50 MCG/DOSE AEPB Inhale 1 puff into the lungs 2 (two) times daily. 12/19/15   Mannam, [place] Robinsons, MD  indomethacin (INDOCIN) 50 MG capsule Take 1 capsule (50 mg total) by mouth 3 (three) times daily with meals for 2 days, THEN 1 capsule (50 mg total) 2 (two) times daily with a meal for 5 days. 04/27/17 05/04/17  Zigmund Gottron, NP  prochlorperazine (COMPAZINE) 10 MG tablet Take 1 tablet (10 mg total) by mouth every 6 (six) hours  as needed (Nausea or vomiting). 08/04/16 08/17/16  Nicholas Lose, MD    Family History Family History  Problem Relation Age of Onset  . Hypertension Mother   . Kidney disease Mother   . Hypertension Father   . Stroke Sister   . Breast cancer Maternal Grandmother        died at 37  . Breast cancer Paternal Grandmother   . Breast cancer Cousin        pat first cousin dx in her 10s  . Cancer Maternal Aunt        unknown form  . Breast cancer Paternal Aunt   . Colon cancer Other 27       MGMs brother  . Breast cancer Paternal Aunt     Social History Social History   Tobacco Use  . Smoking status: Former Smoker    Packs/day: 0.33    Years: 10.00    Pack years: 3.30    Types: Cigarettes    Last attempt to quit: 10/04/2011    Years since quitting: 5.5  . Smokeless tobacco: Never Used  Substance Use Topics  . Alcohol use: Yes    Alcohol/week: 0.0 oz    Comment: social  . Drug use: No     Allergies   Amoxicillin; Ampicillin; and Sulfa antibiotics   Review of Systems Review of Systems   Physical Exam Triage Vital Signs ED Triage Vitals  Enc Vitals Group     BP 04/27/17 1228 130/90     Pulse Rate 04/27/17 1228 83      Resp --      Temp 04/27/17 1228 (!) 97.4 F (36.3 C)     Temp Source 04/27/17 1228 Oral     SpO2 04/27/17 1228 91 %     Weight --      Height --      Head Circumference --      Peak Flow --      Pain Score 04/27/17 1224 10     Pain Loc --      Pain Edu? --      Excl. in [place]? --    No data found.  Updated Vital Signs BP 130/90 (BP Location: Right Arm)   Pulse 83   Temp (!) 97.4 F (36.3 C) (Oral)   LMP 05/11/2013   SpO2 91%   Visual Acuity Right Eye Distance:   Left Eye Distance:   Bilateral Distance:    Right Eye Near:   Left Eye Near:    Bilateral Near:     Physical Exam  Constitutional: She is oriented to person, place, and time. She appears well-developed and well-nourished. No distress.  Cardiovascular: Normal rate, regular rhythm and normal heart sounds.  Pulmonary/Chest: Effort normal and breath sounds normal.  Musculoskeletal:       Right foot: There is decreased range of motion, tenderness, bony tenderness and swelling. There is normal capillary refill, no crepitus, no deformity and no laceration.       Feet:  Redness and swelling to MTP of great toe of left foot; sensation intact, ROM limited by pain; cap refill < 2sec   Neurological: She is alert and oriented to person, place, and time.  Skin: Skin is warm and dry.     UC Treatments / Results  Labs (all labs ordered are listed, but only abnormal results are displayed) Labs Reviewed - No data to display  EKG  EKG Interpretation None       Radiology No results  found.  Procedures Procedures (including critical care time)  Medications Ordered in UC Medications - No data to display   Initial Impression / Assessment and Plan / UC Course  I have reviewed the triage vital signs and the nursing notes.  Pertinent labs & imaging results that were available during my care of the patient were reviewed by me and considered in my medical decision making (see chart for details).     History and  physical consistent with gout at this time. Without calf or leg pain, shortness of breath, tachypnea. O2 in low 90's per chart review patient has been running in low 90's. Indomethacin for pain control. Ice, elevate. Return precautions provided. Patient verbalized understanding and agreeable to plan.    Final Clinical Impressions(s) / UC Diagnoses   Final diagnoses:  Acute gout involving toe of left foot, unspecified cause    ED Discharge Orders        Ordered    indomethacin (INDOCIN) 50 MG capsule     04/27/17 1245       Controlled Substance Prescriptions [place] Controlled Substance Registry consulted? Not Applicable   Zigmund Gottron, NP 04/27/17 1252

## 2017-04-27 NOTE — ED Triage Notes (Signed)
Pt reports left foot pain that started on Sunday.  She denies any injury to the foot.

## 2017-04-28 DIAGNOSIS — G4733 Obstructive sleep apnea (adult) (pediatric): Secondary | ICD-10-CM | POA: Diagnosis not present

## 2017-05-04 ENCOUNTER — Ambulatory Visit: Payer: Self-pay

## 2017-05-11 ENCOUNTER — Inpatient Hospital Stay: Payer: BLUE CROSS/BLUE SHIELD

## 2017-05-11 ENCOUNTER — Inpatient Hospital Stay: Payer: BLUE CROSS/BLUE SHIELD | Admitting: Hematology and Oncology

## 2017-05-11 ENCOUNTER — Telehealth: Payer: Self-pay

## 2017-05-11 ENCOUNTER — Inpatient Hospital Stay: Payer: BLUE CROSS/BLUE SHIELD | Attending: Hematology and Oncology

## 2017-05-11 VITALS — BP 132/94 | HR 83 | Temp 98.5°F | Resp 18

## 2017-05-11 DIAGNOSIS — C50212 Malignant neoplasm of upper-inner quadrant of left female breast: Secondary | ICD-10-CM | POA: Insufficient documentation

## 2017-05-11 DIAGNOSIS — D259 Leiomyoma of uterus, unspecified: Secondary | ICD-10-CM

## 2017-05-11 DIAGNOSIS — Z5112 Encounter for antineoplastic immunotherapy: Secondary | ICD-10-CM | POA: Diagnosis not present

## 2017-05-11 DIAGNOSIS — Z95828 Presence of other vascular implants and grafts: Secondary | ICD-10-CM

## 2017-05-11 DIAGNOSIS — Z170 Estrogen receptor positive status [ER+]: Principal | ICD-10-CM

## 2017-05-11 DIAGNOSIS — D649 Anemia, unspecified: Secondary | ICD-10-CM

## 2017-05-11 LAB — CBC WITH DIFFERENTIAL/PLATELET
BASOS PCT: 1 %
Basophils Absolute: 0 10*3/uL (ref 0.0–0.1)
EOS ABS: 0.1 10*3/uL (ref 0.0–0.5)
EOS PCT: 4 %
HCT: 38.2 % (ref 34.8–46.6)
Hemoglobin: 12.8 g/dL (ref 11.6–15.9)
LYMPHS ABS: 0.9 10*3/uL (ref 0.9–3.3)
Lymphocytes Relative: 22 %
MCH: 32.5 pg (ref 25.1–34.0)
MCHC: 33.4 g/dL (ref 31.5–36.0)
MCV: 97.2 fL (ref 79.5–101.0)
Monocytes Absolute: 0.5 10*3/uL (ref 0.1–0.9)
Monocytes Relative: 11 %
Neutro Abs: 2.6 10*3/uL (ref 1.5–6.5)
Neutrophils Relative %: 62 %
PLATELETS: 140 10*3/uL — AB (ref 145–400)
RBC: 3.93 MIL/uL (ref 3.70–5.45)
RDW: 13.5 % (ref 11.2–14.5)
WBC: 4.1 10*3/uL (ref 3.9–10.3)

## 2017-05-11 LAB — COMPREHENSIVE METABOLIC PANEL
ALK PHOS: 102 U/L (ref 40–150)
ALT: 14 U/L (ref 0–55)
AST: 23 U/L (ref 5–34)
Albumin: 3.3 g/dL — ABNORMAL LOW (ref 3.5–5.0)
Anion gap: 10 (ref 3–11)
BILIRUBIN TOTAL: 1.2 mg/dL (ref 0.2–1.2)
BUN: 13 mg/dL (ref 7–26)
CALCIUM: 9.1 mg/dL (ref 8.4–10.4)
CHLORIDE: 104 mmol/L (ref 98–109)
CO2: 24 mmol/L (ref 22–29)
CREATININE: 1.07 mg/dL (ref 0.60–1.10)
GFR calc Af Amer: >60 mL/min (ref >60)
GFR, EST NON AFRICAN AMERICAN: 59 mL/min — AB (ref >60)
Glucose, Bld: 129 mg/dL (ref 70–140)
Potassium: 4.1 mmol/L (ref 3.5–5.1)
Sodium: 138 mmol/L (ref 136–145)
Total Protein: 7.7 g/dL (ref 6.4–8.3)

## 2017-05-11 MED ORDER — SODIUM CHLORIDE 0.9% FLUSH
10.0000 mL | INTRAVENOUS | Status: DC | PRN
  Administered 2017-05-11: 10 mL
  Filled 2017-05-11: qty 10

## 2017-05-11 MED ORDER — DIPHENHYDRAMINE HCL 25 MG PO CAPS
ORAL_CAPSULE | ORAL | Status: AC
  Filled 2017-05-11: qty 2

## 2017-05-11 MED ORDER — DIPHENHYDRAMINE HCL 25 MG PO CAPS
50.0000 mg | ORAL_CAPSULE | Freq: Once | ORAL | Status: AC
  Administered 2017-05-11: 50 mg via ORAL

## 2017-05-11 MED ORDER — ACETAMINOPHEN 325 MG PO TABS
650.0000 mg | ORAL_TABLET | Freq: Once | ORAL | Status: AC
  Administered 2017-05-11: 650 mg via ORAL

## 2017-05-11 MED ORDER — SODIUM CHLORIDE 0.9 % IV SOLN
20.0000 mg | Freq: Once | INTRAVENOUS | Status: AC
  Administered 2017-05-11: 20 mg via INTRAVENOUS
  Filled 2017-05-11: qty 2

## 2017-05-11 MED ORDER — TBO-FILGRASTIM 300 MCG/0.5ML ~~LOC~~ SOSY
PREFILLED_SYRINGE | SUBCUTANEOUS | Status: AC
  Filled 2017-05-11: qty 0.5

## 2017-05-11 MED ORDER — HEPARIN SOD (PORK) LOCK FLUSH 100 UNIT/ML IV SOLN
500.0000 [IU] | Freq: Once | INTRAVENOUS | Status: AC | PRN
  Administered 2017-05-11: 500 [IU]
  Filled 2017-05-11: qty 5

## 2017-05-11 MED ORDER — ACETAMINOPHEN 325 MG PO TABS
ORAL_TABLET | ORAL | Status: AC
  Filled 2017-05-11: qty 2

## 2017-05-11 MED ORDER — TRASTUZUMAB CHEMO 150 MG IV SOLR
600.0000 mg | Freq: Once | INTRAVENOUS | Status: AC
  Administered 2017-05-11: 600 mg via INTRAVENOUS
  Filled 2017-05-11: qty 28.57

## 2017-05-11 MED ORDER — SODIUM CHLORIDE 0.9% FLUSH
10.0000 mL | Freq: Once | INTRAVENOUS | Status: AC
  Administered 2017-05-11: 10 mL
  Filled 2017-05-11: qty 10

## 2017-05-11 MED ORDER — SODIUM CHLORIDE 0.9 % IV SOLN
Freq: Once | INTRAVENOUS | Status: AC
  Administered 2017-05-11: 14:00:00 via INTRAVENOUS

## 2017-05-11 MED ORDER — FAMOTIDINE IN NACL 20-0.9 MG/50ML-% IV SOLN: 20.0000 mg | Freq: Once | INTRAVENOUS | Status: DC

## 2017-05-11 NOTE — Patient Instructions (Signed)
[facility] Discharge Instructions for Patients Receiving Chemotherapy  Today you received the following chemotherapy agents Herceptin  To help prevent nausea and vomiting after your treatment, we encourage you to take your nausea medication as directed.   If you develop nausea and vomiting that is not controlled by your nausea medication, call the clinic.   BELOW ARE SYMPTOMS THAT SHOULD BE REPORTED IMMEDIATELY:  *FEVER GREATER THAN 100.5 F  *CHILLS WITH OR WITHOUT FEVER  NAUSEA AND VOMITING THAT IS NOT CONTROLLED WITH YOUR NAUSEA MEDICATION  *UNUSUAL SHORTNESS OF BREATH  *UNUSUAL BRUISING OR BLEEDING  TENDERNESS IN MOUTH AND THROAT WITH OR WITHOUT PRESENCE OF ULCERS  *URINARY PROBLEMS  *BOWEL PROBLEMS  UNUSUAL RASH Items with * indicate a potential emergency and should be followed up as soon as possible.  Feel free to call the clinic should you have any questions or concerns. The clinic phone number is [phone].  Please show the CHEMO ALERT CARD at check-in to the Emergency Department and triage nurse.   

## 2017-05-11 NOTE — Telephone Encounter (Signed)
Pt was arrived 1 1/2 hour late and Dr. Lindi Adie is unable to see her. He will see her March 5th at her next treatment. Appoinments have been made for lab/flush/MD prior to infusion. Called Infusion RN to print out new schedule for patient.  Cyndia Bent RN

## 2017-05-11 NOTE — Assessment & Plan Note (Deleted)
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 Adjuvant chemotherapy with Abraxane and Herceptin (cannot take Taxol because she could not receive steroids)weekly 12 followed by every 3 week Herceptin for one yearuntil May 2019 2. followed by adjuvant radiation08/29/2018-01/05/2017 3. Followed by adjuvant antiestrogen therapystarted 01/28/2017 ------------------------------------------------------------------------------------------------------------------------------- Treatment plan: Adjuvant antiestrogen therapy withanastrozole 1 mg daily 01/28/2017  Anastrozole toxicities: Denies any hot flashes or myalgias.  Herceptin toxicities: Decreased appetite and taste  Left breast lymphedema  Monitoring closely for toxicities Return to clinic every 3 weeks for Herceptin every 6 weeks for follow-up withme

## 2017-05-19 ENCOUNTER — Telehealth: Payer: Self-pay | Admitting: Internal Medicine

## 2017-05-19 NOTE — Telephone Encounter (Signed)
Copied from [place]. Topic: Quick Communication - See Telephone Encounter >> May 19, 2017  8:21 AM Synthia Innocent wrote: CRM for notification. See Telephone encounter for: patient scheduled for New Pt on 06/03/17, prior patient of Dr Mclean-Scocuzza, would like to speak with provider before appt regarding gout. Having a hard time and in pain. No sooner appts available.  05/19/17.

## 2017-05-19 NOTE — Telephone Encounter (Signed)
Please advise

## 2017-05-20 NOTE — Telephone Encounter (Signed)
Called pt and scheduled for Monday at 4pm

## 2017-05-20 NOTE — Telephone Encounter (Signed)
See if can work in sooner this was my former pt like Friday Or early next week   Thanks tMS

## 2017-05-20 NOTE — Telephone Encounter (Signed)
Please advise.

## 2017-05-20 NOTE — Telephone Encounter (Signed)
Are you willing to schedule patient?

## 2017-05-24 ENCOUNTER — Ambulatory Visit: Payer: BLUE CROSS/BLUE SHIELD | Admitting: Internal Medicine

## 2017-05-24 DIAGNOSIS — G4733 Obstructive sleep apnea (adult) (pediatric): Secondary | ICD-10-CM

## 2017-05-24 DIAGNOSIS — I8290 Acute embolism and thrombosis of unspecified vein: Secondary | ICD-10-CM

## 2017-05-24 DIAGNOSIS — E119 Type 2 diabetes mellitus without complications: Secondary | ICD-10-CM

## 2017-05-24 DIAGNOSIS — M109 Gout, unspecified: Secondary | ICD-10-CM

## 2017-05-24 DIAGNOSIS — M1712 Unilateral primary osteoarthritis, left knee: Secondary | ICD-10-CM

## 2017-05-24 DIAGNOSIS — C50212 Malignant neoplasm of upper-inner quadrant of left female breast: Secondary | ICD-10-CM

## 2017-05-24 DIAGNOSIS — I872 Venous insufficiency (chronic) (peripheral): Secondary | ICD-10-CM

## 2017-05-24 DIAGNOSIS — E785 Hyperlipidemia, unspecified: Secondary | ICD-10-CM

## 2017-05-24 DIAGNOSIS — I10 Essential (primary) hypertension: Secondary | ICD-10-CM

## 2017-05-24 DIAGNOSIS — D509 Iron deficiency anemia, unspecified: Secondary | ICD-10-CM | POA: Diagnosis not present

## 2017-05-24 DIAGNOSIS — Z170 Estrogen receptor positive status [ER+]: Secondary | ICD-10-CM

## 2017-05-24 DIAGNOSIS — I77819 Aortic ectasia, unspecified site: Secondary | ICD-10-CM | POA: Diagnosis not present

## 2017-05-24 DIAGNOSIS — I272 Pulmonary hypertension, unspecified: Secondary | ICD-10-CM

## 2017-05-24 DIAGNOSIS — Z9989 Dependence on other enabling machines and devices: Secondary | ICD-10-CM

## 2017-05-24 DIAGNOSIS — I517 Cardiomegaly: Secondary | ICD-10-CM | POA: Diagnosis not present

## 2017-05-24 MED ORDER — COLCHICINE 0.6 MG PO TABS: ORAL_TABLET | ORAL | 0 refills | Status: DC

## 2017-05-24 MED ORDER — PREDNISONE 20 MG PO TABS: ORAL_TABLET | ORAL | 0 refills | Status: DC

## 2017-05-24 MED ORDER — ALLOPURINOL 100 MG PO TABS: 100.0000 mg | ORAL_TABLET | Freq: Every day | ORAL | 1 refills | Status: DC

## 2017-05-24 NOTE — Patient Instructions (Addendum)
Please f/u in 3 weeks  Start Allopurinol 100 mg daily in 2 weeks   Start prednisone in am with food now with colchichine Resume colchicine daily x 3 months     Low-Purine Diet Purines are compounds that affect the level of uric acid in your body. A low-purine diet is a diet that is low in purines. Eating a low-purine diet can prevent the level of uric acid in your body from getting too high and causing gout or kidney stones or both. What do I need to know about this diet?  Choose low-purine foods. Examples of low-purine foods are listed in the next section.  Drink plenty of fluids, especially water. Fluids can help remove uric acid from your body. Try to drink 8-16 cups (1.9-3.8 L) a day.  Limit foods high in fat, especially saturated fat, as fat makes it harder for the body to get rid of uric acid. Foods high in saturated fat include pizza, cheese, ice cream, whole milk, fried foods, and gravies. Choose foods that are lower in fat and lean sources of protein. Use olive oil when cooking as it contains healthy fats that are not high in saturated fat.  Limit alcohol. Alcohol interferes with the elimination of uric acid from your body. If you are having a gout attack, avoid all alcohol.  Keep in mind that different people's bodies react differently to different foods. You will probably learn over time which foods do or do not affect you. If you discover that a food tends to cause your gout to flare up, avoid eating that food. You can more freely enjoy foods that do not cause problems. If you have any questions about a food item, talk to your dietitian or health care provider. Which foods are low, moderate, and high in purines? The following is a list of foods that are low, moderate, and high in purines. You can eat any amount of the foods that are low in purines. You may be able to have small amounts of foods that are moderate in purines. Ask your health care provider how much of a food moderate  in purines you can have. Avoid foods high in purines. Grains  Foods low in purines: Enriched white bread, pasta, rice, cake, cornbread, popcorn.  Foods moderate in purines: Whole-grain breads and cereals, wheat germ, bran, oatmeal. Uncooked oatmeal. Dry wheat bran or wheat germ.  Foods high in purines: Pancakes, [place] toast, biscuits, muffins. Vegetables  Foods low in purines: All vegetables, except those that are moderate in purines.  Foods moderate in purines: Asparagus, cauliflower, spinach, mushrooms, green peas. Fruits  All fruits are low in purines. Meats and other Protein Foods  Foods low in purines: Eggs, nuts, peanut butter.  Foods moderate in purines: 80-90% lean beef, lamb, veal, pork, poultry, fish, eggs, peanut butter, nuts. Crab, lobster, oysters, and shrimp. Cooked dried beans, peas, and lentils.  Foods high in purines: Anchovies, sardines, herring, mussels, tuna, codfish, scallops, trout, and haddock. [name]. Organ meats (such as liver or kidney). Tripe. Game meat. Goose. Sweetbreads. Dairy  All dairy foods are low in purines. Low-fat and fat-free dairy products are best because they are low in saturated fat. Beverages  Drinks low in purines: Water, carbonated beverages, tea, coffee, cocoa.  Drinks moderate in purines: Soft drinks and other drinks sweetened with high-fructose corn syrup. Juices. To find whether a food or drink is sweetened with high-fructose corn syrup, look at the ingredients list.  Drinks high in purines: Alcoholic beverages (such  as beer). Condiments  Foods low in purines: Salt, herbs, olives, pickles, relishes, vinegar.  Foods moderate in purines: Butter, margarine, oils, mayonnaise. Fats and Oils  Foods low in purines: All types, except gravies and sauces made with meat.  Foods high in purines: Gravies and sauces made with meat. Other Foods  Foods low in purines: Sugars, sweets, gelatin. Cake. Soups made without meat.  Foods  moderate in purines: Meat-based or fish-based soups, broths, or bouillons. Foods and drinks sweetened with high-fructose corn syrup.  Foods high in purines: High-fat desserts (such as ice cream, cookies, cakes, pies, doughnuts, and chocolate). Contact your dietitian for more information on foods that are not listed here. This information is not intended to replace advice given to you by your health care provider. Make sure you discuss any questions you have with your health care provider. Document Released: 07/11/2010 Document Revised: 08/22/2015 Document Reviewed: 02/20/2013 Elsevier Interactive Patient Education  2017 [place].  Gout Gout is painful swelling that can occur in some of your joints. Gout is a type of arthritis. This condition is caused by having too much uric acid in your body. Uric acid is a chemical that forms when your body breaks down substances called purines. Purines are important for building body proteins. When your body has too much uric acid, sharp crystals can form and build up inside your joints. This causes pain and swelling. Gout attacks can happen quickly and be very painful (acute gout). Over time, the attacks can affect more joints and become more frequent (chronic gout). Gout can also cause uric acid to build up under your skin and inside your kidneys. What are the causes? This condition is caused by too much uric acid in your blood. This can occur because:  Your kidneys do not remove enough uric acid from your blood. This is the most common cause.  Your body makes too much uric acid. This can occur with some cancers and cancer treatments. It can also occur if your body is breaking down too many red blood cells (hemolytic anemia).  You eat too many foods that are high in purines. These foods include organ meats and some seafood. Alcohol, especially beer, is also high in purines.  A gout attack may be triggered by trauma or stress. What increases the  risk? This condition is more likely to develop in people who:  Have a family history of gout.  Are female and middle-aged.  Are female and have gone through menopause.  Are obese.  Frequently drink alcohol, especially beer.  Are dehydrated.  Lose weight too quickly.  Have an organ transplant.  Have lead poisoning.  Take certain medicines, including aspirin, cyclosporine, diuretics, levodopa, and niacin.  Have kidney disease or psoriasis.  What are the signs or symptoms? An attack of acute gout happens quickly. It usually occurs in just one joint. The most common place is the big toe. Attacks often start at night. Other joints that may be affected include joints of the feet, ankle, knee, fingers, wrist, or elbow. Symptoms may include:  Severe pain.  Warmth.  Swelling.  Stiffness.  Tenderness. The affected joint may be very painful to touch.  Shiny, red, or purple skin.  Chills and fever.  Chronic gout may cause symptoms more frequently. More joints may be involved. You may also have white or yellow lumps (tophi) on your hands or feet or in other areas near your joints. How is this diagnosed? This condition is diagnosed based on your symptoms,  medical history, and physical exam. You may have tests, such as:  Blood tests to measure uric acid levels.  Removal of joint fluid with a needle (aspiration) to look for uric acid crystals.  X-rays to look for joint damage.  How is this treated? Treatment for this condition has two phases: treating an acute attack and preventing future attacks. Acute gout treatment may include medicines to reduce pain and swelling, including:  NSAIDs.  Steroids. These are strong anti-inflammatory medicines that can be taken by mouth (orally) or injected into a joint.  Colchicine. This medicine relieves pain and swelling when it is taken soon after an attack. It can be given orally or through an IV tube.  Preventive treatment may  include:  Daily use of smaller doses of NSAIDs or colchicine.  Use of a medicine that reduces uric acid levels in your blood.  Changes to your diet. You may need to see a specialist about healthy eating (dietitian).  Follow these instructions at home: During a Gout Attack  If directed, apply ice to the affected area: ? Put ice in a plastic bag. ? Place a towel between your skin and the bag. ? Leave the ice on for 20 minutes, 2-3 times a day.  Rest the joint as much as possible. If the affected joint is in your leg, you may be given crutches to use.  Raise (elevate) the affected joint above the level of your heart as often as possible.  Drink enough fluids to keep your urine clear or pale yellow.  Take over-the-counter and prescription medicines only as told by your health care provider.  Do not drive or operate heavy machinery while taking prescription pain medicine.  Follow instructions from your health care provider about eating or drinking restrictions.  Return to your normal activities as told by your health care provider. Ask your health care provider what activities are safe for you. Avoiding Future Gout Attacks  Follow a low-purine diet as told by your dietitian or health care provider. Avoid foods and drinks that are high in purines, including liver, kidney, anchovies, asparagus, herring, mushrooms, mussels, and beer.  Limit alcohol intake to no more than 1 drink a day for nonpregnant women and 2 drinks a day for men. One drink equals 12 oz of beer, 5 oz of wine, or 1 oz of hard liquor.  Maintain a healthy weight or lose weight if you are overweight. If you want to lose weight, talk with your health care provider. It is important that you do not lose weight too quickly.  Start or maintain an exercise program as told by your health care provider.  Drink enough fluids to keep your urine clear or pale yellow.  Take over-the-counter and prescription medicines only as  told by your health care provider.  Keep all follow-up visits as told by your health care provider. This is important. Contact a health care provider if:  You have another gout attack.  You continue to have symptoms of a gout attack after10 days of treatment.  You have side effects from your medicines.  You have chills or a fever.  You have burning pain when you urinate.  You have pain in your lower back or belly. Get help right away if:  You have severe or uncontrolled pain.  You cannot urinate. This information is not intended to replace advice given to you by your health care provider. Make sure you discuss any questions you have with your health care provider. Document  Released: 03/13/2000 Document Revised: 08/22/2015 Document Reviewed: 12/27/2014 Elsevier Interactive Patient Education  [employer].

## 2017-05-24 NOTE — Progress Notes (Signed)
Pre visit review using our clinic review tool, if applicable. No additional management support is needed unless otherwise documented below in the visit note. 

## 2017-05-25 LAB — URIC ACID: URIC ACID, SERUM: 8.9 mg/dL — AB (ref 2.4–7.0)

## 2017-05-26 ENCOUNTER — Encounter: Payer: Self-pay | Admitting: Internal Medicine

## 2017-05-26 ENCOUNTER — Telehealth: Payer: Self-pay | Admitting: Internal Medicine

## 2017-05-26 DIAGNOSIS — I110 Hypertensive heart disease with heart failure: Secondary | ICD-10-CM | POA: Insufficient documentation

## 2017-05-26 DIAGNOSIS — I272 Pulmonary hypertension, unspecified: Secondary | ICD-10-CM | POA: Insufficient documentation

## 2017-05-26 DIAGNOSIS — I10 Essential (primary) hypertension: Secondary | ICD-10-CM | POA: Insufficient documentation

## 2017-05-26 MED ORDER — METOPROLOL TARTRATE 25 MG PO TABS: 25.0000 mg | ORAL_TABLET | Freq: Two times a day (BID) | ORAL | 1 refills | Status: DC

## 2017-05-26 MED ORDER — SPIRONOLACTONE 50 MG PO TABS: 50.0000 mg | ORAL_TABLET | Freq: Every day | ORAL | 1 refills | Status: DC

## 2017-05-26 NOTE — Telephone Encounter (Signed)
Med is at correct pharmacy and is being filled. Will be ready in 1 hour. Tried to call pt but mailbox is full.

## 2017-05-26 NOTE — Telephone Encounter (Signed)
Copied from [place]. Topic: Quick Communication - Rx Refill/Question >> May 26, 2017  9:15 AM Marin Olp L wrote: Medication:  colchicine 0.6 MG tablet   Has the patient contacted their pharmacy? Yes.    (Agent: If no, request that the patient contact the pharmacy for the refill.)  Preferred Pharmacy (with phone number or street name): [id number] [place] [address]  [id number]: [phone]: [phone] Agent: Please be advised that RX refills may take up to 3 business days. We ask that you follow-up with your pharmacy.  Patient says the script needs PA so she couldn't get it on the 25th.

## 2017-05-26 NOTE — Progress Notes (Addendum)
Chief Complaint  Patient presents with  . Establish Care    former [facility]    Establish care former pt Dr. [place]  1. C/o left great toe pain today 4/10 is 6/10 with walking. She was seen 04/27/17 in the ED and given 3 pills of colchicine 0.6 and indomethacin She did not like the way indocin may her feel and caused stomach pains. She denies h/o ulcers.  She is actively taking chemo infusions  2. Left breast cancer she started chemo tx'ed 09/30/17 and has continued and in august q3 weeks getting herceptin last dose she predicts will be 07/2017 reviewed labs plts 140 slightly low which could be side effect of chemo. She follows with H/o in [place] and has not f/u with surgery Dr. Barry Dienes     Review of Systems  Constitutional: Positive for weight loss.  HENT: Negative for hearing loss.   Respiratory: Negative for shortness of breath.   Cardiovascular: Negative for chest pain.  Gastrointestinal: Negative for abdominal pain.  Musculoskeletal: Positive for joint pain.  Skin: Negative for rash.  Neurological: Negative for headaches.   Past Medical History:  Diagnosis Date  . Anemia    iron deficiency  . Borderline diabetes   . Complication of anesthesia    woke up slowly- after hysterectomy- 2015  . Diabetes mellitus, type II ([place])   . DVT (deep venous thrombosis) ([place]) 2014   left leg  . Family history of breast cancer   . Gout   . HTN (hypertension)   . Malignant neoplasm of upper-inner quadrant of left female breast ([place]) 03/06/2016  . Menorrhagia    secondary to uterine fibroids  . OSA (obstructive sleep apnea)    07/25/13 HST AHI 33/hr, severe hypoxemia O2 min 42% and 95% of the time <89%  . PE (pulmonary embolism) 2014   bilateral  . Pulmonary artery hypertension ([place])   . Pulmonary nodule    (41m on loeft lower lobe) found on CT scan July 2014, repeat scan Jan 2015 showed less than 427m . S/P TAH (total abdominal hysterectomy) 06/07/2013   Past Surgical History:    Procedure Laterality Date  . ABDOMINAL HYSTERECTOMY N/A 06/07/2013   Procedure: HYSTERECTOMY ABDOMINAL WITH BIALTERAL SALPINGECTOMY;  Surgeon: VaElveria RoyalsMD;  [facility];  Service: Gynecology;  Laterality: N/A;  . BREAST LUMPECTOMY Left 07/27/2016   BREAST LUMPECTOMY WITH RADIOACTIVE SEED AND SENTINEL LYMPH NODE BIOPSY (Left)  . BREAST LUMPECTOMY WITH RADIOACTIVE SEED AND SENTINEL LYMPH NODE BIOPSY Left 07/27/2016   Procedure: BREAST LUMPECTOMY WITH RADIOACTIVE SEED AND SENTINEL LYMPH NODE BIOPSY;  Surgeon: FaStark KleinMD;  [facility] Service: General;  Laterality: Left;  . CARDIAC CATHETERIZATION N/A 12/24/2015   Procedure: Right Heart Cath;  Surgeon: JaAdrian ProwsMD;  [facility];  Service: Cardiovascular;  Laterality: N/A;  . CESAREAN SECTION    . POLYPECTOMY  2008   Removal of uterine polyp  . PORTA CATH INSERTION  07/27/2016  . PORTACATH PLACEMENT Right 07/27/2016   Procedure: INSERTION PORT-A-CATH;  Surgeon: FaStark KleinMD;  Location: [facility];  Service: General;  Laterality: Right;   Family History  Problem Relation Age of Onset  . Hypertension Mother   . Kidney disease Mother   . Hypertension Father   . Stroke Sister   . Breast cancer Maternal Grandmother        died at 5723. Breast cancer Paternal Grandmother   . Breast cancer Cousin  pat first cousin dx in her 21s  . Cancer Maternal Aunt        unknown form  . Breast cancer Paternal Aunt   . Colon cancer Other 27       MGMs brother  . Breast cancer Paternal Aunt    Social History   Socioeconomic History  . Marital status: Single    Spouse name: Not on file  . Number of children: 1  . Years of education: Not on file  . Highest education level: Not on file  Social Needs  . Financial resource strain: Not on file  . Food insecurity - worry: Not on file  . Food insecurity - inability: Not on file  . Transportation needs - medical: Not on file  . Transportation needs -  non-medical: Not on file  Occupational History  . Not on file  Tobacco Use  . Smoking status: Former Smoker    Packs/day: 0.33    Years: 10.00    Pack years: 3.30    Types: Cigarettes    Last attempt to quit: 10/04/2011    Years since quitting: 5.6  . Smokeless tobacco: Never Used  Substance and Sexual Activity  . Alcohol use: Yes    Alcohol/week: 0.0 oz    Comment: social  . Drug use: No  . Sexual activity: Yes  Other Topics Concern  . 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 acetaminophen (TYLENOL) 500 MG tablet Take 1,000 mg by mouth every 6 (six) hours as needed for moderate pain or headache.   . albuterol (PROVENTIL HFA;VENTOLIN HFA) 108 (90 Base) MCG/ACT inhaler Inhale 2 puffs into the lungs every 6 (six) hours as needed for wheezing or shortness of breath.  . anastrozole (ARIMIDEX) 1 MG tablet Take 1 mg by mouth daily.  . Azelastine-Fluticasone 137-50 MCG/ACT SUSP Place 1 spray into the nose 2 (two) times daily.  . colchicine 0.6 MG tablet Daily  . Fluticasone-Salmeterol (ADVAIR DISKUS) 250-50 MCG/DOSE AEPB Inhale 1 puff into the lungs 2 (two) times daily.  . hyaluronate sodium (RADIAPLEXRX) GEL Apply 1 application topically once.  Marland Kitchen ibuprofen (ADVIL,MOTRIN) 800 MG tablet Take 800 mg by mouth every 8 (eight) hours as needed for moderate pain.  Marland Kitchen losartan (COZAAR) 100 MG tablet Take 1 tablet (100 mg total) by mouth daily. (Patient taking differently: Take 50 mg by mouth daily. )  . metFORMIN (GLUCOPHAGE) 500 MG tablet Take 1 tablet (500 mg total) by mouth 2 (two) times daily with a meal.  . metoprolol tartrate (LOPRESSOR) 25 MG tablet Take 1 tablet (25 mg total) by mouth 2 (two) times daily.  . OPSUMIT 10 MG TABS Take 10 mg by mouth daily.  Vladimir Faster Glycol-Propyl Glycol (SYSTANE OP)  Apply 1 drop to eye daily as needed (dry eyes).  . sildenafil (REVATIO) 20 MG tablet Take 20 mg by mouth 3 (three) times daily.  Marland Kitchen spironolactone (ALDACTONE) 50 MG tablet Take 50 mg by mouth daily.   Marland Kitchen torsemide (DEMADEX) 20 MG tablet Take 20 mg by mouth 2 (two) times daily.  Marland Kitchen UPTRAVI 200 MCG TABS Take 200 mcg by mouth 2 (two) times daily.  . [DISCONTINUED] chlorpheniramine-HYDROcodone (TUSSIONEX PENNKINETIC ER) 10-8 MG/5ML SUER Take 5 mLs by mouth every  12 (twelve) hours as needed for cough. rx written and given to patient  . [DISCONTINUED] colchicine 0.6 MG tablet Take 2 tablets now. If no improvement, may take additional tablet in 1 hour  . [DISCONTINUED] fluticasone (FLONASE) 50 MCG/ACT nasal spray Place 2 sprays into both nostrils daily. (Patient taking differently: Place 2 sprays into both nostrils daily as needed for allergies. )  . [DISCONTINUED] lidocaine-prilocaine (EMLA) cream Apply to affected area once   Allergies  Allergen Reactions  . Amoxicillin Other (See Comments)    LIGHT-HEADED and CLOSE TO "PASSING OUT" ABDOMINAL PAIN  Has patient had a PCN reaction causing immediate rash, facial/tongue/throat swelling, SOB or lightheadedness with hypotension: No Has patient had a PCN reaction causing severe rash involving mucus membranes or skin necrosis: No Has patient had a PCN reaction that required hospitalization No Has patient had a PCN reaction occurring within the last 10 years: No If all of the above answers are "NO", then may proceed with Cephalosporin use.  . Ampicillin Other (See Comments)    Severe abdominal pain, dizziness (penicillin is okay)   . Sulfa Antibiotics Rash and Other (See Comments)    Very bad yeast infection    Recent Results (from the past 2160 hour(s))  Comprehensive metabolic panel     Status: None   Collection Time: 03/31/17  1:32 PM  Result Value Ref Range   Sodium 139 136 - 145 mEq/L   Potassium 3.6 3.5 - 5.1 mEq/L   Chloride 106 98 - 109  mEq/L   CO2 26 22 - 29 mEq/L   Glucose 129 70 - 140 mg/dl    Comment: Glucose reference range is for nonfasting patients. Fasting glucose reference range is 70- 100.   BUN 18.1 7.0 - 26.0 mg/dL   Creatinine 1.0 0.6 - 1.1 mg/dL   Total Bilirubin 0.97 0.20 - 1.20 mg/dL   Alkaline Phosphatase 96 40 - 150 U/L   AST 16 5 - 34 U/L   ALT 14 0 - 55 U/L   Total Protein 7.8 6.4 - 8.3 g/dL   Albumin 3.5 3.5 - 5.0 g/dL   Calcium 9.2 8.4 - 10.4 mg/dL   Anion Gap 8 3 - 11 mEq/L   EGFR >60 >60 ml/min/1.73 m2    Comment: eGFR is calculated using the CKD-EPI Creatinine Equation (2009)  CBC with Differential     Status: Abnormal   Collection Time: 03/31/17  1:32 PM  Result Value Ref Range   WBC 6.4 3.9 - 10.3 10e3/uL   NEUT# 4.6 1.5 - 6.5 10e3/uL   HGB 13.4 11.6 - 15.9 g/dL   HCT 39.7 34.8 - 46.6 %   Platelets 143 (L) 145 - 400 10e3/uL   MCV 99.3 79.5 - 101.0 fL   MCH 33.5 25.1 - 34.0 pg   MCHC 33.8 31.5 - 36.0 g/dL   RBC 4.00 3.70 - 5.45 10e6/uL   RDW 14.0 11.2 - 14.5 %   lymph# 1.1 0.9 - 3.3 10e3/uL   MONO# 0.5 0.1 - 0.9 10e3/uL   Eosinophils Absolute 0.2 0.0 - 0.5 10e3/uL   Basophils Absolute 0.0 0.0 - 0.1 10e3/uL   NEUT% 72.0 38.4 - 76.8 %   LYMPH% 16.6 14.0 - 49.7 %   MONO% 8.1 0.0 - 14.0 %   EOS% 3.0 0.0 - 7.0 %   BASO% 0.3 0.0 - 2.0 %  Comprehensive metabolic panel     Status: Abnormal   Collection Time: 05/11/17  1:13 PM  Result Value Ref Range  Sodium 138 136 - 145 mmol/L   Potassium 4.1 3.5 - 5.1 mmol/L   Chloride 104 98 - 109 mmol/L   CO2 24 22 - 29 mmol/L   Glucose, Bld 129 70 - 140 mg/dL   BUN 13 7 - 26 mg/dL   Creatinine, Ser 1.07 0.60 - 1.10 mg/dL   Calcium 9.1 8.4 - 10.4 mg/dL   Total Protein 7.7 6.4 - 8.3 g/dL   Albumin 3.3 (L) 3.5 - 5.0 g/dL   AST 23 5 - 34 U/L   ALT 14 0 - 55 U/L   Alkaline Phosphatase 102 40 - 150 U/L   Total Bilirubin 1.2 0.2 - 1.2 mg/dL   GFR calc non Af Amer 59 (L) >60 mL/min   GFR calc Af Amer >60 >60 mL/min    Comment: (NOTE) The eGFR  has been calculated using the CKD EPI equation. This calculation has not been validated in all clinical situations. eGFR's persistently <60 mL/min signify possible Chronic Kidney Disease.    Anion gap 10 3 - 11    Comment: Performed at [facility], [address], [place], [place] [address]  CBC with Differential     Status: Abnormal   Collection Time: 05/11/17  1:13 PM  Result Value Ref Range   WBC 4.1 3.9 - 10.3 K/uL   RBC 3.93 3.70 - 5.45 MIL/uL   Hemoglobin 12.8 11.6 - 15.9 g/dL   HCT 38.2 34.8 - 46.6 %   MCV 97.2 79.5 - 101.0 fL   MCH 32.5 25.1 - 34.0 pg   MCHC 33.4 31.5 - 36.0 g/dL   RDW 13.5 11.2 - 14.5 %   Platelets 140 (L) 145 - 400 K/uL   Neutrophils Relative % 62 %   Neutro Abs 2.6 1.5 - 6.5 K/uL   Lymphocytes Relative 22 %   Lymphs Abs 0.9 0.9 - 3.3 K/uL   Monocytes Relative 11 %   Monocytes Absolute 0.5 0.1 - 0.9 K/uL   Eosinophils Relative 4 %   Eosinophils Absolute 0.1 0.0 - 0.5 K/uL   Basophils Relative 1 %   Basophils Absolute 0.0 0.0 - 0.1 K/uL    Comment: Performed at [facility], [place] [address], [place], [place] [address]  Uric acid     Status: Abnormal   Collection Time: 05/24/17  4:57 PM  Result Value Ref Range   Uric Acid, Serum 8.9 (H) 2.4 - 7.0 mg/dL   Objective  There is no height or weight on file to calculate BMI. Wt Readings from Last 3 Encounters:  03/31/17 236 lb 11.2 oz (107.4 kg)  03/09/17 235 lb 8 oz (106.8 kg)  02/16/17 228 lb 8 oz (103.6 kg)   Temp Readings from Last 3 Encounters:  05/11/17 98.5 F (36.9 C) (Oral)  04/27/17 (!) 97.4 F (36.3 C) (Oral)  04/20/17 98.7 F (37.1 C) (Oral)   BP Readings from Last 3 Encounters:  05/11/17 (!) 132/94  04/27/17 130/90  04/20/17 103/69   Pulse Readings from Last 3 Encounters:  05/11/17 83  04/27/17 83  04/20/17 86   o2 sat room air 94%  Physical Exam  Constitutional: She is oriented to person, place, and time and well-developed,  well-nourished, and in no distress. Vital signs are normal.  HENT:  Head: Normocephalic and atraumatic.  Mouth/Throat: Oropharynx is clear and moist and mucous membranes are normal.  Eyes: Conjunctivae are normal. Pupils are equal, round, and reactive to light.  Cardiovascular: Normal rate, regular  rhythm and normal heart sounds.  Pulmonary/Chest: Effort normal and breath sounds normal.  Musculoskeletal:       Feet:  Neurological: She is alert and oriented to person, place, and time. Gait normal. Gait normal.  Skin: Skin is warm and dry.  Psychiatric: Mood, memory, affect and judgment normal.  Nursing note and vitals reviewed.   Assessment   1. Left toe gout  2. Left breast cancer dx'ed abnormal mammo 02/27/2016 invasive ductal ca s/p lumpectomy Dr Barry Dienes 06/2016 (surgery)  3. HM Plan   1. Check uric acid today  Prednisone taper  colcrys 0.6 mg qd x 3 months  Start allopurinol 100 mg in 2 weeks  Given diet list  2.  Had mammogram left dx 05/21/16 see below  F/u H/o [facility] also prev saw Dr. Lebron Quam [facility] 3.  flu shot had 01/17/18  Tdap due 2019  Will need to check [facility] record [facility] -no vaccines noted in records   Pap smear check [facility] records h/o abnormal pap in 20s per prior notes  -s/p TAH DUB 2/2 fibroids, adenomyosis  -follows with Dr. Josephina Shih OB/[facility] pap neg 08/2008  mammo had 04/2016 left slight increase in size of 10 oclock left breast mass with unchanged adjacent satellite lesion s/p lumpectomy 06/2016 Dr. 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 Was on lantus 25 units at one pt but off  -urine protein  due 08/07/17  She sees Dr. Harrietta Guardian pulmonary [facility] for pulmonary HTN she was on Xarelto 20 mg qd for PE/DVT 2014/2015  -pfts 2016 suggest mild restriction reduced FVC, FEV1, slow VC, mod reduction  DLCO Dr. Vaughan Browner H/o FOBT + 05/14/13  H/o UTI H/o lung nodules 4 mm CT chest 05/11/13 stable CTA 01/31/16  H/o thyroid nodules b/l 02/10/13 and increased LN submandibular  Echo 10/29/15 Dr. Nadyne Coombes LV nl grade 1 DD elevated LA/LV end diastolic pressure, RA mod dilated, RV mod dilated moderately reduced RV function, trace MR, trace TR, mild pulmonic regurg, IVC dilated with poor inspiration collapse consistent with elevated right atrial pressure.   Provider: Dr. Olivia Mackie McLean-Scocuzza-Internal Medicine

## 2017-05-27 DIAGNOSIS — G4733 Obstructive sleep apnea (adult) (pediatric): Secondary | ICD-10-CM | POA: Diagnosis not present

## 2017-05-29 ENCOUNTER — Other Ambulatory Visit: Payer: Self-pay | Admitting: Pulmonary Disease

## 2017-05-31 ENCOUNTER — Other Ambulatory Visit: Payer: Self-pay | Admitting: Internal Medicine

## 2017-05-31 DIAGNOSIS — R0902 Hypoxemia: Secondary | ICD-10-CM

## 2017-05-31 DIAGNOSIS — J45909 Unspecified asthma, uncomplicated: Secondary | ICD-10-CM

## 2017-05-31 MED ORDER — FLUTICASONE-SALMETEROL 250-50 MCG/DOSE IN AEPB: 1.0000 | INHALATION_SPRAY | Freq: Two times a day (BID) | RESPIRATORY_TRACT | 5 refills | Status: DC

## 2017-06-01 ENCOUNTER — Inpatient Hospital Stay: Payer: BLUE CROSS/BLUE SHIELD

## 2017-06-01 ENCOUNTER — Inpatient Hospital Stay: Payer: BLUE CROSS/BLUE SHIELD | Attending: Hematology and Oncology

## 2017-06-01 ENCOUNTER — Encounter: Payer: Self-pay | Admitting: *Deleted

## 2017-06-01 ENCOUNTER — Inpatient Hospital Stay (HOSPITAL_BASED_OUTPATIENT_CLINIC_OR_DEPARTMENT_OTHER): Payer: BLUE CROSS/BLUE SHIELD | Admitting: Adult Health

## 2017-06-01 VITALS — BP 136/84 | HR 99 | Temp 98.0°F | Resp 18 | Ht 65.0 in | Wt 228.9 lb

## 2017-06-01 DIAGNOSIS — Z923 Personal history of irradiation: Secondary | ICD-10-CM

## 2017-06-01 DIAGNOSIS — D649 Anemia, unspecified: Secondary | ICD-10-CM

## 2017-06-01 DIAGNOSIS — E2839 Other primary ovarian failure: Secondary | ICD-10-CM

## 2017-06-01 DIAGNOSIS — Z170 Estrogen receptor positive status [ER+]: Secondary | ICD-10-CM | POA: Insufficient documentation

## 2017-06-01 DIAGNOSIS — Z79899 Other long term (current) drug therapy: Secondary | ICD-10-CM | POA: Insufficient documentation

## 2017-06-01 DIAGNOSIS — Z79811 Long term (current) use of aromatase inhibitors: Secondary | ICD-10-CM | POA: Diagnosis not present

## 2017-06-01 DIAGNOSIS — Z9221 Personal history of antineoplastic chemotherapy: Secondary | ICD-10-CM | POA: Insufficient documentation

## 2017-06-01 DIAGNOSIS — C50212 Malignant neoplasm of upper-inner quadrant of left female breast: Secondary | ICD-10-CM

## 2017-06-01 DIAGNOSIS — Z5112 Encounter for antineoplastic immunotherapy: Secondary | ICD-10-CM | POA: Insufficient documentation

## 2017-06-01 DIAGNOSIS — Z7984 Long term (current) use of oral hypoglycemic drugs: Secondary | ICD-10-CM | POA: Diagnosis not present

## 2017-06-01 DIAGNOSIS — Z5111 Encounter for antineoplastic chemotherapy: Secondary | ICD-10-CM

## 2017-06-01 DIAGNOSIS — Z7952 Long term (current) use of systemic steroids: Secondary | ICD-10-CM | POA: Diagnosis not present

## 2017-06-01 DIAGNOSIS — Z95828 Presence of other vascular implants and grafts: Secondary | ICD-10-CM

## 2017-06-01 DIAGNOSIS — D259 Leiomyoma of uterus, unspecified: Secondary | ICD-10-CM

## 2017-06-01 LAB — CBC WITH DIFFERENTIAL/PLATELET
BASOS ABS: 0 10*3/uL (ref 0.0–0.1)
BASOS PCT: 0 %
EOS ABS: 0.1 10*3/uL (ref 0.0–0.5)
EOS PCT: 1 %
HCT: 40.4 % (ref 34.8–46.6)
Hemoglobin: 13.3 g/dL (ref 11.6–15.9)
Lymphocytes Relative: 19 %
Lymphs Abs: 1.9 10*3/uL (ref 0.9–3.3)
MCH: 32 pg (ref 25.1–34.0)
MCHC: 32.9 g/dL (ref 31.5–36.0)
MCV: 97.1 fL (ref 79.5–101.0)
MONO ABS: 0.7 10*3/uL (ref 0.1–0.9)
Monocytes Relative: 7 %
Neutro Abs: 7.5 10*3/uL — ABNORMAL HIGH (ref 1.5–6.5)
Neutrophils Relative %: 73 %
PLATELETS: 182 10*3/uL (ref 145–400)
RBC: 4.16 MIL/uL (ref 3.70–5.45)
RDW: 13.1 % (ref 11.2–14.5)
WBC: 10.2 10*3/uL (ref 3.9–10.3)

## 2017-06-01 LAB — COMPREHENSIVE METABOLIC PANEL
ALBUMIN: 3.6 g/dL (ref 3.5–5.0)
ALT: 26 U/L (ref 0–55)
AST: 20 U/L (ref 5–34)
Alkaline Phosphatase: 107 U/L (ref 40–150)
Anion gap: 13 — ABNORMAL HIGH (ref 3–11)
BUN: 25 mg/dL (ref 7–26)
CHLORIDE: 104 mmol/L (ref 98–109)
CO2: 25 mmol/L (ref 22–29)
CREATININE: 1.01 mg/dL (ref 0.60–1.10)
Calcium: 9.6 mg/dL (ref 8.4–10.4)
GFR calc Af Amer: >60 mL/min (ref >60)
GFR calc non Af Amer: >60 mL/min (ref >60)
Glucose, Bld: 95 mg/dL (ref 70–140)
POTASSIUM: 4.3 mmol/L (ref 3.5–5.1)
SODIUM: 142 mmol/L (ref 136–145)
Total Bilirubin: 0.6 mg/dL (ref 0.2–1.2)
Total Protein: 8 g/dL (ref 6.4–8.3)

## 2017-06-01 MED ORDER — ALTEPLASE 2 MG IJ SOLR
2.0000 mg | Freq: Once | INTRAMUSCULAR | Status: DC
  Filled 2017-06-01: qty 2

## 2017-06-01 MED ORDER — ALTEPLASE 2 MG IJ SOLR
2.0000 mg | Freq: Once | INTRAMUSCULAR | Status: AC | PRN
  Administered 2017-06-01: 2 mg
  Filled 2017-06-01: qty 2

## 2017-06-01 MED ORDER — FAMOTIDINE IN NACL 20-0.9 MG/50ML-% IV SOLN: 20.0000 mg | Freq: Once | INTRAVENOUS | Status: DC

## 2017-06-01 MED ORDER — DIPHENHYDRAMINE HCL 25 MG PO CAPS
ORAL_CAPSULE | ORAL | Status: AC
  Filled 2017-06-01: qty 2

## 2017-06-01 MED ORDER — SODIUM CHLORIDE 0.9% FLUSH
10.0000 mL | Freq: Once | INTRAVENOUS | Status: AC
  Administered 2017-06-01: 10 mL
  Filled 2017-06-01: qty 10

## 2017-06-01 MED ORDER — HEPARIN SOD (PORK) LOCK FLUSH 100 UNIT/ML IV SOLN
500.0000 [IU] | Freq: Once | INTRAVENOUS | Status: AC | PRN
  Administered 2017-06-01: 500 [IU]
  Filled 2017-06-01: qty 5

## 2017-06-01 MED ORDER — DIPHENHYDRAMINE HCL 25 MG PO CAPS
50.0000 mg | ORAL_CAPSULE | Freq: Once | ORAL | Status: AC
  Administered 2017-06-01: 50 mg via ORAL

## 2017-06-01 MED ORDER — SODIUM CHLORIDE 0.9 % IV SOLN
Freq: Once | INTRAVENOUS | Status: AC
  Administered 2017-06-01: 13:00:00 via INTRAVENOUS

## 2017-06-01 MED ORDER — ALTEPLASE 2 MG IJ SOLR
INTRAMUSCULAR | Status: AC
  Filled 2017-06-01: qty 2

## 2017-06-01 MED ORDER — HEPARIN SOD (PORK) LOCK FLUSH 100 UNIT/ML IV SOLN
500.0000 [IU] | Freq: Once | INTRAVENOUS | Status: DC
  Filled 2017-06-01: qty 5

## 2017-06-01 MED ORDER — SODIUM CHLORIDE 0.9% FLUSH
10.0000 mL | INTRAVENOUS | Status: DC | PRN
  Administered 2017-06-01: 10 mL
  Filled 2017-06-01: qty 10

## 2017-06-01 MED ORDER — SODIUM CHLORIDE 0.9 % IV SOLN
500.0000 mL | Freq: Once | INTRAVENOUS | Status: AC
  Administered 2017-06-01: 500 mL via INTRAVENOUS

## 2017-06-01 MED ORDER — ACETAMINOPHEN 325 MG PO TABS
650.0000 mg | ORAL_TABLET | Freq: Once | ORAL | Status: AC
  Administered 2017-06-01: 650 mg via ORAL

## 2017-06-01 MED ORDER — SODIUM CHLORIDE 0.9 % IV SOLN
20.0000 mg | Freq: Once | INTRAVENOUS | Status: AC
  Administered 2017-06-01: 20 mg via INTRAVENOUS
  Filled 2017-06-01: qty 2

## 2017-06-01 MED ORDER — TRASTUZUMAB CHEMO 150 MG IV SOLR
600.0000 mg | Freq: Once | INTRAVENOUS | Status: AC
  Administered 2017-06-01: 600 mg via INTRAVENOUS
  Filled 2017-06-01: qty 28.57

## 2017-06-01 MED ORDER — ACETAMINOPHEN 325 MG PO TABS
ORAL_TABLET | ORAL | Status: AC
  Filled 2017-06-01: qty 2

## 2017-06-01 NOTE — Assessment & Plan Note (Addendum)
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 Adjuvant chemotherapy with Abraxane and Herceptin (cannot take Taxol because she could not receive steroids)weekly 12 followed by every 3 week Herceptin for one yearuntil May 2019 2. followed by adjuvant radiation08/29/2018-01/05/2017 3. Followed by adjuvant antiestrogen therapystarted 01/28/2017 ------------------------------------------------------------------------------------------------------------------------------- Treatment plan: Adjuvant antiestrogen therapy withanastrozole 1 mg daily 01/28/2017  Anastrozole toxicities: She is taking Anastrozole daily and tolerating it moderately well.  She is experiencing hot flashes.  We reviewed interventions for these hot flashes, including pharmacologic, such as effexor or gabapentin, and non pharmacologic, including changing the time of day she is taking her medication, avoiding hot/spicy food/drink, a cooling pillow, and acupuncture.  She is going to try changing the time of day she takes the Anastrozole to see if that will help.   Laqueisha is due for mammogram next month.  I will order a bone density test to be done at this time.    Adanely is tolerating the Herceptin quite well.  She has no symptoms of heart failure.  I reviewed with Dr. Lindi Adie that her last echo was on 01/05/17 and was normal.  He cleared her to proceed with Herceptin today.  She will have an echocardiogram ASAP.  Dr. Lindi Adie saw the patient today and reviewed the plan with her.    Maleigh will return in 3 weeks for labs, f/u and her next Herceptin.

## 2017-06-01 NOTE — Progress Notes (Signed)
Port flushed several times with repositioning and no blood return. Mendel Ryder desk nurse called Cecille Rubin) and CATHFLOW will be administered. Porsche Cates LPN

## 2017-06-01 NOTE — Patient Instructions (Signed)
Trastuzumab injection for infusion What is this medicine? TRASTUZUMAB (tras TOO zoo mab) is a monoclonal antibody. It is used to treat breast cancer and stomach cancer. This medicine may be used for other purposes; ask your health care provider or pharmacist if you have questions. COMMON BRAND NAME(S): Herceptin What should I tell my health care provider before I take this medicine? They need to know if you have any of these conditions: -heart disease -heart failure -lung or breathing disease, like asthma -an unusual or allergic reaction to trastuzumab, benzyl alcohol, or other medications, foods, dyes, or preservatives -pregnant or trying to get pregnant -breast-feeding How should I use this medicine? This drug is given as an infusion into a vein. It is administered in a hospital or clinic by a specially trained health care professional. Talk to your pediatrician regarding the use of this medicine in children. This medicine is not approved for use in children. Overdosage: If you think you have taken too much of this medicine contact a poison control center or emergency room at once. NOTE: This medicine is only for you. Do not share this medicine with others. What if I miss a dose? It is important not to miss a dose. Call your doctor or health care professional if you are unable to keep an appointment. What may interact with this medicine? This medicine may interact with the following medications: -certain types of chemotherapy, such as daunorubicin, doxorubicin, epirubicin, and idarubicin This list may not describe all possible interactions. Give your health care provider a list of all the medicines, herbs, non-prescription drugs, or dietary supplements you use. Also tell them if you smoke, drink alcohol, or use illegal drugs. Some items may interact with your medicine. What should I watch for while using this medicine? Visit your doctor for checks on your progress. Report any side effects.  Continue your course of treatment even though you feel ill unless your doctor tells you to stop. Call your doctor or health care professional for advice if you get a fever, chills or sore throat, or other symptoms of a cold or flu. Do not treat yourself. Try to avoid being around people who are sick. You may experience fever, chills and shaking during your first infusion. These effects are usually mild and can be treated with other medicines. Report any side effects during the infusion to your health care professional. Fever and chills usually do not happen with later infusions. Do not become pregnant while taking this medicine or for 7 months after stopping it. Women should inform their doctor if they wish to become pregnant or think they might be pregnant. Women of child-bearing potential will need to have a negative pregnancy test before starting this medicine. There is a potential for serious side effects to an unborn child. Talk to your health care professional or pharmacist for more information. Do not breast-feed an infant while taking this medicine or for 7 months after stopping it. Women must use effective birth control with this medicine. What side effects may I notice from receiving this medicine? Side effects that you should report to your doctor or health care professional as soon as possible: -allergic reactions like skin rash, itching or hives, swelling of the face, lips, or tongue -chest pain or palpitations -cough -dizziness -feeling faint or lightheaded, falls -fever -general ill feeling or flu-like symptoms -signs of worsening heart failure like breathing problems; swelling in your legs and feet -unusually weak or tired Side effects that usually do not require medical  attention (report to your doctor or health care professional if they continue or are bothersome): -bone pain -changes in taste -diarrhea -joint pain -nausea/vomiting -weight loss This list may not describe all  possible side effects. Call your doctor for medical advice about side effects. You may report side effects to FDA at 1-800-FDA-1088. Where should I keep my medicine? This drug is given in a hospital or clinic and will not be stored at home. NOTE: This sheet is a summary. It may not cover all possible information. If you have questions about this medicine, talk to your doctor, pharmacist, or health care provider.  2018 Elsevier/Gold Standard (2016-03-10 14:37:52)

## 2017-06-01 NOTE — Progress Notes (Addendum)
Patient Care Team: McLean-Scocuzza, Nino Glow, MD as PCP - General (Internal Medicine) Vonna Drafts, FNP as Nurse Practitioner (Nurse Practitioner) Stark Klein, MD as Consulting Physician (General Surgery) Nicholas Lose, MD as Consulting Physician (Hematology and Oncology) Gery Pray, MD as Consulting Physician (Radiation Oncology)  DIAGNOSIS:  Encounter Diagnoses  Name Primary?  . Malignant neoplasm of upper-inner quadrant of left breast in female, estrogen receptor positive ([place])   . Encounter for antineoplastic chemotherapy Yes  . Estrogen deficiency     SUMMARY OF ONCOLOGIC HISTORY:   Malignant neoplasm of upper-inner quadrant of left breast in female, estrogen receptor positive ([place])   03/04/2016 Initial Diagnosis    Screening detected left breast density posterior medial 1.4 cm by ultrasound axilla negative, grade 3 IDC ER 60%, PR 0%, Ki-67 30%, HER-2 positive ratio 6.04, copy #16; T1 cN0 stage IA clinical stage      03/21/2016 Genetic Testing    NEgative genetic testing on the comprehensive cancer panel and Negative genetic testing for the MSH2 inversion analysis ([place] inversion). The Comprehensive Cancer Panel offered by [facility] includes sequencing and/or deletion duplication testing of the following 32 genes: APC, ATM, AXIN2, BARD1, BMPR1A, BRCA1, BRCA2, BRIP1, CDH1, CDK4, CDKN2A, CHEK2, EPCAM, FANCC, MLH1, MSH2, MSH6, MUTYH, NBN, PALB2, PMS2, POLD1, POLE, PTEN, RAD51C, RAD51D, SCG5/GREM1, SMAD4, STK11, TP53, VHL, and XRCC2.   The report date is March 21, 2016.      07/27/2016 Surgery    Left lumpectomy: IDC grade 2, 2.6 cm, DCIS intermediate grade,0/3 lymph nodes negative, ER 60%, PR 0%, Ki-67 30%, HER-2 positive ratio 6.04, T2 N0 stage II a      09/01/2016 - 11/03/2016 Chemotherapy    Abraxane Herceptin weekly 12 followed by Herceptin maintenance every 3 weeks       11/19/2016 - 01/20/2017 Radiation Therapy    Adjuvant radiation therapy      01/29/2017 -   Anti-estrogen oral therapy    Anastrozole 2.5 mg daily       CHIEF COMPLIANT: Follow-up on Herceptin and Anastrozole  INTERVAL HISTORY: Sahithi Ordoyne is a 52 year old with above-mentioned history of left breast cancer treated with lumpectomy followed by adjuvant chemotherapy and radiation is currently on Anastrozole.  She is having hot flashes and vaginal dryness from the Anastrozole. She is unsure when she needs another mammogram.  She wants to know what her upcoming testing and f/u plan will be.  Her last echocardiogram was in October.    REVIEW OF SYSTEMS:   Constitutional: Denies fevers, chills or abnormal weight loss Eyes: Denies blurriness of vision Ears, nose, mouth, throat, and face: Denies mucositis or sore throat Respiratory: Denies cough, dyspnea or wheezes Cardiovascular: Denies palpitation, chest discomfort Gastrointestinal:  Denies nausea, heartburn or change in bowel habits Skin: Denies abnormal skin rashes Lymphatics: Denies new lymphadenopathy or easy bruising Neurological:Denies numbness, tingling or new weaknesses Behavioral/Psych: Mood is stable, no new changes  Extremities: No lower extremity edema  All other systems were reviewed with the patient and are negative.  I have reviewed the past medical history, past surgical history, social history and family history with the patient and they are unchanged from previous note.  ALLERGIES:  is allergic to amoxicillin; ampicillin; and sulfa antibiotics.  MEDICATIONS:  Current Outpatient Medications  Medication Sig Dispense Refill  . acetaminophen (TYLENOL) 500 MG tablet Take 1,000 mg by mouth every 6 (six) hours as needed for moderate pain or headache.     . albuterol (PROVENTIL HFA;VENTOLIN HFA) 108 (90 Base) MCG/ACT inhaler  Inhale 2 puffs into the lungs every 6 (six) hours as needed for wheezing or shortness of breath. 1 Inhaler 2  . allopurinol (ZYLOPRIM) 100 MG tablet Take 1 tablet (100 mg total) by mouth daily.  90 tablet 1  . anastrozole (ARIMIDEX) 1 MG tablet Take 1 mg by mouth daily.  1  . Azelastine-Fluticasone 137-50 MCG/ACT SUSP Place 1 spray into the nose 2 (two) times daily. 23 g 5  . colchicine 0.6 MG tablet Daily 90 tablet 0  . Fluticasone-Salmeterol (ADVAIR DISKUS) 250-50 MCG/DOSE AEPB Inhale 1 puff into the lungs 2 (two) times daily. Rinse mouth 60 each 5  . hyaluronate sodium (RADIAPLEXRX) GEL Apply 1 application topically once.    Marland Kitchen ibuprofen (ADVIL,MOTRIN) 800 MG tablet Take 800 mg by mouth every 8 (eight) hours as needed for moderate pain.    Marland Kitchen losartan (COZAAR) 100 MG tablet Take 1 tablet (100 mg total) by mouth daily. (Patient taking differently: Take 50 mg by mouth daily. ) 30 tablet 0  . metFORMIN (GLUCOPHAGE) 500 MG tablet Take 1 tablet (500 mg total) by mouth 2 (two) times daily with a meal. 60 tablet 2  . metoprolol tartrate (LOPRESSOR) 25 MG tablet Take 1 tablet (25 mg total) by mouth 2 (two) times daily. 180 tablet 1  . OPSUMIT 10 MG TABS Take 10 mg by mouth daily.  8  . Polyethyl Glycol-Propyl Glycol (SYSTANE OP) Apply 1 drop to eye daily as needed (dry eyes).    . predniSONE (DELTASONE) 20 MG tablet 40 mg ( 2pills) x 5 days, then 20 mg (1 pill x 4 days), then 1/2 pill x 1 day then stop. Take in am with food 15 tablet 0  . sildenafil (REVATIO) 20 MG tablet Take 20 mg by mouth 3 (three) times daily.    Marland Kitchen spironolactone (ALDACTONE) 50 MG tablet Take 1 tablet (50 mg total) by mouth daily. 90 tablet 1  . torsemide (DEMADEX) 20 MG tablet Take 20 mg by mouth 2 (two) times daily.    Marland Kitchen UPTRAVI 200 MCG TABS Take 200 mcg by mouth 2 (two) times daily.  10   No current facility-administered medications for this visit.    Facility-Administered Medications Ordered in Other Visits  Medication Dose Route Frequency Provider Last Rate Last Dose  . heparin lock flush 100 unit/mL  500 Units Intracatheter Once PRN Nicholas Lose, MD      . sodium chloride flush (NS) 0.9 % injection 10 mL  10 mL  Intracatheter PRN Nicholas Lose, MD        PHYSICAL EXAMINATION: ECOG PERFORMANCE STATUS: 1 - Symptomatic but completely ambulatory  Vitals:   06/01/17 1040  BP: 136/84  Pulse: 99  Resp: 18  Temp: 98 F (36.7 C)  SpO2: 92%   Filed Weights   06/01/17 1040  Weight: 228 lb 14.4 oz (103.8 kg)    GENERAL:alert, no distress and comfortable SKIN: skin color, texture, turgor are normal, no rashes or significant lesions EYES: normal, Conjunctiva are pink and non-injected, sclera clear OROPHARYNX:no exudate, no erythema and lips, buccal mucosa, and tongue normal  NECK: supple, thyroid normal size, non-tender, without nodularity LYMPH:  no palpable lymphadenopathy in the cervical, axillary or inguinal LUNGS: clear to auscultation and percussion with normal breathing effort HEART: regular rate & rhythm and no murmurs and no lower extremity edema ABDOMEN:abdomen soft, non-tender and normal bowel sounds MUSCULOSKELETAL:no cyanosis of digits and no clubbing  NEURO: alert & oriented x 3 with fluent speech, no focal motor/sensory  deficits EXTREMITIES: No lower extremity edema  LABORATORY DATA:  I have reviewed the data as listed CMP Latest Ref Rng & Units 06/01/2017 05/11/2017 03/31/2017  Glucose 70 - 140 mg/dL 95 129 129  BUN 7 - 26 mg/dL 25 13 18.1  Creatinine 0.60 - 1.10 mg/dL 1.01 1.07 1.0  Sodium 136 - 145 mmol/L 142 138 139  Potassium 3.5 - 5.1 mmol/L 4.3 4.1 3.6  Chloride 98 - 109 mmol/L 104 104 -  CO2 22 - 29 mmol/L _0 Calcium 8.4 - 10.4 mg/dL 9.6 9.1 9.2  Total Protein 6.4 - 8.3 g/dL 8.0 7.7 7.8  Total Bilirubin 0.2 - 1.2 mg/dL 0.6 1.2 0.97  Alkaline Phos 40 - 150 U/L 107 102 96  AST 5 - 34 U/L _1 ALT 0 - 55 U/L _2 Lab Results  Component Value Date   WBC 10.2 06/01/2017   HGB 13.3 06/01/2017   HCT 40.4 06/01/2017   MCV 97.1 06/01/2017   PLT 182 06/01/2017   NEUTROABS 7.5 (H) 06/01/2017    ASSESSMENT & PLAN:  Malignant neoplasm of upper-inner  quadrant of left breast in female, estrogen receptor positive ([place]) Left lumpectomy 07/28/2016: IDC grade 2, 2.6 cm, DCIS intermediate grade,0/3 lymph nodes negative, ER 60%, PR 0%, Ki-67 30%, HER-2 positive ratio 6.04, T2 N0 stage II a  Treatment summary: 1. Adjuvant chemotherapy with Abraxane and Herceptin (cannot take Taxol because she could not receive steroids)weekly 12 followed by every 3 week Herceptin for one yearuntil May 2019 2. followed by adjuvant radiation08/29/2018-01/05/2017 3. Followed by adjuvant antiestrogen therapystarted 01/28/2017 ------------------------------------------------------------------------------------------------------------------------------- Treatment plan: Adjuvant antiestrogen therapy withanastrozole 1 mg daily 01/28/2017  Anastrozole toxicities: She is taking Anastrozole daily and tolerating it moderately well.  She is experiencing hot flashes.  We reviewed interventions for these hot flashes, including pharmacologic, such as effexor or gabapentin, and non pharmacologic, including changing the time of day she is taking her medication, avoiding hot/spicy food/drink, a cooling pillow, and acupuncture.  She is going to try changing the time of day she takes the Anastrozole to see if that will help.   Bellany is due for mammogram next month.  I will order a bone density test to be done at this time.    Michaelah is tolerating the Herceptin quite well.  She has no symptoms of heart failure.  I reviewed with Dr. Lindi Adie that her last echo was on 01/05/17 and was normal.  He cleared her to proceed with Herceptin today.  She will have an echocardiogram ASAP.  Dr. Lindi Adie saw the patient today and reviewed the plan with her.    [place] will return in 3 weeks for labs, f/u and her next Herceptin.       I spent 25 minutes talking to the patient of which more than half was spent in counseling and coordination of care.  Orders Placed This Encounter  Procedures  .  MM DIAG BREAST TOMO BILATERAL    Standing Status:   Future    Standing Expiration Date:   08/02/2018    Order Specific Question:   Reason for Exam (SYMPTOM  OR DIAGNOSIS REQUIRED)    Answer:   breast cancer    Order Specific Question:   Preferred imaging location?    Answer:   External    Comments:   [facility]    Order Specific Question:   Is the patient pregnant?    Answer:   No  . DG  Bone Density    Standing Status:   Future    Standing Expiration Date:   06/02/2018    Order Specific Question:   Reason for Exam (SYMPTOM  OR DIAGNOSIS REQUIRED)    Answer:   estrogen deficiency    Order Specific Question:   Preferred imaging location?    Answer:   External    Comments:   [facility]    Order Specific Question:   Is the patient pregnant?    Answer:   No  . ECHOCARDIOGRAM COMPLETE    Standing Status:   Future    Standing Expiration Date:   09/02/2018    Order Specific Question:   Where should this test be performed    Answer:   [facility]    Order Specific Question:   Perflutren DEFINITY (image enhancing agent) should be administered unless hypersensitivity or allergy exist    Answer:   Administer Perflutren    Order Specific Question:   Expected Date:    Answer:   ASAP   The patient has a good understanding of the overall plan. she agrees with it. she will call with any problems that may develop before the next visit here.  A total of (30) minutes of face-to-face time was spent with this patient with greater than 50% of that time in counseling and care-coordination.    Scot Dock, NP 06/01/17  Attending Note  I personally saw and examined [name]. The plan of care was discussed with her. I agree with the assessment and plan as documented above. Patient is tolerating Herceptin and anastrozole extremely well. We will get an echocardiogram. I will see her back in 6 weeks. Signed Harriette Ohara, MD

## 2017-06-01 NOTE — Progress Notes (Signed)
No blood return with normal maneuvers from port.  Alteplase given & blood return obtained after 30 minutes. OK to treat with ECHO done in October per  Dr Lindi Adie.

## 2017-06-02 ENCOUNTER — Telehealth: Payer: Self-pay | Admitting: Adult Health

## 2017-06-02 NOTE — Telephone Encounter (Signed)
Per 3/5 no los

## 2017-06-03 ENCOUNTER — Ambulatory Visit: Payer: Self-pay | Admitting: Internal Medicine

## 2017-06-06 ENCOUNTER — Encounter: Payer: Self-pay | Admitting: Internal Medicine

## 2017-06-06 DIAGNOSIS — I77819 Aortic ectasia, unspecified site: Secondary | ICD-10-CM | POA: Insufficient documentation

## 2017-06-06 DIAGNOSIS — G4733 Obstructive sleep apnea (adult) (pediatric): Secondary | ICD-10-CM | POA: Insufficient documentation

## 2017-06-06 DIAGNOSIS — Z9989 Dependence on other enabling machines and devices: Secondary | ICD-10-CM | POA: Insufficient documentation

## 2017-06-06 DIAGNOSIS — D509 Iron deficiency anemia, unspecified: Secondary | ICD-10-CM | POA: Insufficient documentation

## 2017-06-06 DIAGNOSIS — E119 Type 2 diabetes mellitus without complications: Secondary | ICD-10-CM | POA: Insufficient documentation

## 2017-06-06 DIAGNOSIS — I872 Venous insufficiency (chronic) (peripheral): Secondary | ICD-10-CM | POA: Insufficient documentation

## 2017-06-06 DIAGNOSIS — I517 Cardiomegaly: Secondary | ICD-10-CM | POA: Insufficient documentation

## 2017-06-06 DIAGNOSIS — M1990 Unspecified osteoarthritis, unspecified site: Secondary | ICD-10-CM | POA: Insufficient documentation

## 2017-06-06 DIAGNOSIS — E1129 Type 2 diabetes mellitus with other diabetic kidney complication: Secondary | ICD-10-CM | POA: Insufficient documentation

## 2017-06-06 DIAGNOSIS — E785 Hyperlipidemia, unspecified: Secondary | ICD-10-CM | POA: Insufficient documentation

## 2017-06-08 ENCOUNTER — Other Ambulatory Visit (HOSPITAL_COMMUNITY): Payer: Self-pay

## 2017-06-15 DIAGNOSIS — Z882 Allergy status to sulfonamides status: Secondary | ICD-10-CM | POA: Diagnosis not present

## 2017-06-15 DIAGNOSIS — Z86718 Personal history of other venous thrombosis and embolism: Secondary | ICD-10-CM | POA: Diagnosis not present

## 2017-06-15 DIAGNOSIS — E119 Type 2 diabetes mellitus without complications: Secondary | ICD-10-CM | POA: Diagnosis not present

## 2017-06-15 DIAGNOSIS — Z6837 Body mass index (BMI) 37.0-37.9, adult: Secondary | ICD-10-CM | POA: Diagnosis not present

## 2017-06-15 DIAGNOSIS — I110 Hypertensive heart disease with heart failure: Secondary | ICD-10-CM | POA: Diagnosis not present

## 2017-06-15 DIAGNOSIS — R5383 Other fatigue: Secondary | ICD-10-CM | POA: Diagnosis not present

## 2017-06-15 DIAGNOSIS — G4733 Obstructive sleep apnea (adult) (pediatric): Secondary | ICD-10-CM | POA: Diagnosis not present

## 2017-06-15 DIAGNOSIS — I272 Pulmonary hypertension, unspecified: Secondary | ICD-10-CM | POA: Diagnosis not present

## 2017-06-15 DIAGNOSIS — Z79811 Long term (current) use of aromatase inhibitors: Secondary | ICD-10-CM | POA: Diagnosis not present

## 2017-06-15 DIAGNOSIS — I2721 Secondary pulmonary arterial hypertension: Secondary | ICD-10-CM | POA: Diagnosis not present

## 2017-06-15 DIAGNOSIS — D751 Secondary polycythemia: Secondary | ICD-10-CM | POA: Diagnosis not present

## 2017-06-15 DIAGNOSIS — Z86711 Personal history of pulmonary embolism: Secondary | ICD-10-CM | POA: Diagnosis not present

## 2017-06-15 DIAGNOSIS — Z7982 Long term (current) use of aspirin: Secondary | ICD-10-CM | POA: Diagnosis not present

## 2017-06-15 DIAGNOSIS — C50912 Malignant neoplasm of unspecified site of left female breast: Secondary | ICD-10-CM | POA: Diagnosis not present

## 2017-06-15 DIAGNOSIS — I50812 Chronic right heart failure: Secondary | ICD-10-CM | POA: Diagnosis not present

## 2017-06-15 DIAGNOSIS — Z87891 Personal history of nicotine dependence: Secondary | ICD-10-CM | POA: Diagnosis not present

## 2017-06-18 ENCOUNTER — Ambulatory Visit (HOSPITAL_COMMUNITY)
Admission: RE | Admit: 2017-06-18 | Discharge: 2017-06-18 | Disposition: A | Discharge status: Home / Self Care | Payer: BLUE CROSS/BLUE SHIELD | Source: Ambulatory Visit | Attending: Adult Health | Admitting: Adult Health

## 2017-06-18 DIAGNOSIS — I371 Nonrheumatic pulmonary valve insufficiency: Secondary | ICD-10-CM | POA: Diagnosis not present

## 2017-06-18 DIAGNOSIS — I10 Essential (primary) hypertension: Secondary | ICD-10-CM

## 2017-06-18 DIAGNOSIS — C50212 Malignant neoplasm of upper-inner quadrant of left female breast: Secondary | ICD-10-CM | POA: Diagnosis not present

## 2017-06-18 DIAGNOSIS — E785 Hyperlipidemia, unspecified: Secondary | ICD-10-CM | POA: Insufficient documentation

## 2017-06-18 DIAGNOSIS — E119 Type 2 diabetes mellitus without complications: Secondary | ICD-10-CM | POA: Insufficient documentation

## 2017-06-18 DIAGNOSIS — I272 Pulmonary hypertension, unspecified: Secondary | ICD-10-CM | POA: Insufficient documentation

## 2017-06-18 DIAGNOSIS — Z5111 Encounter for antineoplastic chemotherapy: Secondary | ICD-10-CM | POA: Diagnosis not present

## 2017-06-18 DIAGNOSIS — Z170 Estrogen receptor positive status [ER+]: Secondary | ICD-10-CM | POA: Diagnosis not present

## 2017-06-18 DIAGNOSIS — I119 Hypertensive heart disease without heart failure: Secondary | ICD-10-CM | POA: Insufficient documentation

## 2017-06-18 NOTE — Progress Notes (Signed)
  Echocardiogram 2D Echocardiogram has been performed.  [name] L Androw 06/18/2017, 3:50 PM

## 2017-06-21 ENCOUNTER — Encounter: Payer: Self-pay | Admitting: Internal Medicine

## 2017-06-21 ENCOUNTER — Ambulatory Visit: Payer: BLUE CROSS/BLUE SHIELD | Admitting: Internal Medicine

## 2017-06-21 VITALS — BP 106/60 | HR 88 | Temp 98.0°F | Ht 65.0 in | Wt 230.2 lb

## 2017-06-21 DIAGNOSIS — Z23 Encounter for immunization: Secondary | ICD-10-CM | POA: Diagnosis not present

## 2017-06-21 DIAGNOSIS — M109 Gout, unspecified: Secondary | ICD-10-CM | POA: Diagnosis not present

## 2017-06-21 DIAGNOSIS — Z170 Estrogen receptor positive status [ER+]: Secondary | ICD-10-CM

## 2017-06-21 DIAGNOSIS — E119 Type 2 diabetes mellitus without complications: Secondary | ICD-10-CM

## 2017-06-21 DIAGNOSIS — J309 Allergic rhinitis, unspecified: Secondary | ICD-10-CM

## 2017-06-21 DIAGNOSIS — R1114 Bilious vomiting: Secondary | ICD-10-CM | POA: Diagnosis not present

## 2017-06-21 DIAGNOSIS — C50212 Malignant neoplasm of upper-inner quadrant of left female breast: Secondary | ICD-10-CM

## 2017-06-21 DIAGNOSIS — Z1211 Encounter for screening for malignant neoplasm of colon: Secondary | ICD-10-CM

## 2017-06-21 DIAGNOSIS — R1013 Epigastric pain: Secondary | ICD-10-CM | POA: Diagnosis not present

## 2017-06-21 DIAGNOSIS — R51 Headache: Secondary | ICD-10-CM | POA: Diagnosis not present

## 2017-06-21 DIAGNOSIS — R110 Nausea: Secondary | ICD-10-CM

## 2017-06-21 DIAGNOSIS — R519 Headache, unspecified: Secondary | ICD-10-CM

## 2017-06-21 DIAGNOSIS — R6881 Early satiety: Secondary | ICD-10-CM

## 2017-06-21 LAB — URINALYSIS, ROUTINE W REFLEX MICROSCOPIC
Bilirubin Urine: NEGATIVE
Hgb urine dipstick: NEGATIVE
Ketones, ur: NEGATIVE
Nitrite: NEGATIVE
PH: 5.5 (ref 5.0–8.0)
RBC / HPF: NONE SEEN (ref 0–?)
Specific Gravity, Urine: 1.02 (ref 1.000–1.030)
Urine Glucose: NEGATIVE
Urobilinogen, UA: 0.2 (ref 0.0–1.0)

## 2017-06-21 LAB — MICROALBUMIN / CREATININE URINE RATIO
CREATININE, U: 166.6 mg/dL
MICROALB/CREAT RATIO: 12.1 mg/g (ref 0.0–30.0)
Microalb, Ur: 20.1 mg/dL — ABNORMAL HIGH (ref 0.0–1.9)

## 2017-06-21 MED ORDER — AZELASTINE-FLUTICASONE 137-50 MCG/ACT NA SUSP: 1.0000 | Freq: Two times a day (BID) | NASAL | 11 refills | Status: DC

## 2017-06-21 NOTE — Progress Notes (Signed)
Pre visit review using our clinic review tool, if applicable. No additional management support is needed unless otherwise documented below in the visit note.

## 2017-06-21 NOTE — Patient Instructions (Addendum)
Please sch mammogram at [facility]  Referred for MRI brain and US abdomen  Also please Allopurinol with Colchicine   Chemotherapy Chemotherapy is the use of medicines to stop or slow the growth of cancer cells. Depending on the type and stage of your cancer, you may have chemotherapy to:  Cure your cancer.  Slow the progression of your cancer.  Ease your cancer symptoms.  Improve the benefits of radiation treatment.  Shrink a tumor before surgery.  Rid the body of cancer cells that remain after a tumor is surgically removed.  How is chemotherapy given? Chemotherapy may be given:  By mouth in liquid or pill form.  Through a thin tube that is inserted into a vein or artery.  By getting a shot.  By rubbing a cream or ointment on your skin.  Through liquids that are placed directly into various areas of the body, such as the abdomen, chest, or bladder.  How often is chemotherapy given? Chemotherapy may be given continuously over time, or it may be given in cycles. For example, you may take the medicine for one week out of every month. For how long will I need chemotherapy treatments? The length of treatment depends on many factors, including:  The type of cancer.  Whether the cancer has spread.  How you respond to the chemotherapy.  Whether you develop side effects.  Some types of chemotherapy medicine are given only one time. Others are given for months, years, or for life. What safety precautions must I take while on chemotherapy? Chemotherapy medicines are very strong. They will be in all of your bodily fluids, including your urine, stool, saliva, sweat, tears, vaginal secretions, and semen. You must carefully follow some safety precautions to prevent harm to others while you are using these medicines. Here are some recommended precautions:  Make sure that people who help care for you wear disposable gloves if they are going to come into contact with any of your bodily  fluids. Women who are pregnant or breastfeeding should not handle any of your bodily fluids.  Wash any clothes, towels, and linens that may have your bodily fluids on them twice in a washing machine using very hot water.  Dispose of adult diapers, tampons, and sanitary napkins by first sealing them in a plastic bag.  Use a condom when having sex for at least 2 weeks after receiving your chemotherapy.  Do not share beverages or food.  Keep your chemotherapy medicines in their original bottles. Keep them in a high, safe location, away from children. Do not expose them to heat or moisture. Do not put them in containers with other types of medicines.  Dispose of all wrappers for your chemotherapy medicines by sealing them in a separate plastic bag.  Do not throw away extra medicine, and do not flush it down the toilet. Take medicine that you are not going to use to your health care provider's office where it can be disposed of properly.  Follow your health care provider's directions for the proper disposal of needles, IV tubing, and other medical supplies that have come into contact with your chemotherapy medicines.  If you are issued a hazardous waste container, make sure you understand the directions for using it.  Wash your hands thoroughly with warm water and soap after using the bathroom. Dry your hands with disposable paper towels.  When using the toilet: ? Flush it twice after each use, including after vomiting. ? Close the lid of the toilet prior  to flushing. This helps to avoid splashing. ? Both men and women should sit to use the toilet. This helps avoid splashing.  What are the side effects of chemotherapy? Side effects depend on a variety of factors, including:  The specific type of chemotherapy medicine used.  The dosage.  How long the medicine is used for.  Your overall health.  Some of the side effects you may experience include:  Fatigue and decreased  energy.  Decreased appetite.  Changes in your sense of smell or taste.  Nausea.  Vomiting.  Constipation or diarrhea.  Hair loss.  Increased susceptibility to infection.  Easy bleeding.  Mouth sores.  Burning or tingling in the hands or feet.  Memory problems.  This information is not intended to replace advice given to you by your health care provider. Make sure you discuss any questions you have with your health care provider. Document Released: 01/11/2007 Document Revised: 10/04/2015 Document Reviewed: 08/22/2013 Elsevier Interactive Patient Education  2018 [employer].    Fatigue Fatigue is feeling tired all of the time, a lack of energy, or a lack of motivation. Occasional or mild fatigue is often a normal response to activity or life in general. However, long-lasting (chronic) or extreme fatigue may indicate an underlying medical condition. Follow these instructions at home: Watch your fatigue for any changes. The following actions may help to lessen any discomfort you are feeling:  Talk to your health care provider about how much sleep you need each night. Try to get the required amount every night.  Take medicines only as directed by your health care provider.  Eat a healthy and nutritious diet. Ask your health care provider if you need help changing your diet.  Drink enough fluid to keep your urine clear or pale yellow.  Practice ways of relaxing, such as yoga, meditation, massage therapy, or acupuncture.  Exercise regularly.  Change situations that cause you stress. Try to keep your work and personal routine reasonable.  Do not abuse illegal drugs.  Limit alcohol intake to no more than 1 drink per day for nonpregnant women and 2 drinks per day for men. One drink equals 12 ounces of beer, 5 ounces of wine, or 1 ounces of hard liquor.  Take a multivitamin, if directed by your health care provider.  Contact a health care provider if:  Your fatigue  does not get better.  You have a fever.  You have unintentional weight loss or gain.  You have headaches.  You have difficulty: ? Falling asleep. ? Sleeping throughout the night.  You feel angry, guilty, anxious, or sad.  You are unable to have a bowel movement (constipation).  You skin is dry.  Your legs or another part of your body is swollen. Get help right away if:  You feel confused.  Your vision is blurry.  You feel faint or pass out.  You have a severe headache.  You have severe abdominal, pelvic, or back pain.  You have chest pain, shortness of breath, or an irregular or fast heartbeat.  You are unable to urinate or you urinate less than normal.  You develop abnormal bleeding, such as bleeding from the rectum, vagina, nose, lungs, or nipples.  You vomit blood.  You have thoughts about harming yourself or committing suicide.  You are worried that you might harm someone else. This information is not intended to replace advice given to you by your health care provider. Make sure you discuss any questions you have with  your health care provider. Document Released: 01/11/2007 Document Revised: 08/22/2015 Document Reviewed: 07/18/2013 Elsevier Interactive Patient Education  [employer].

## 2017-06-22 ENCOUNTER — Other Ambulatory Visit: Payer: Self-pay | Admitting: Internal Medicine

## 2017-06-22 ENCOUNTER — Inpatient Hospital Stay (HOSPITAL_BASED_OUTPATIENT_CLINIC_OR_DEPARTMENT_OTHER): Payer: BLUE CROSS/BLUE SHIELD | Admitting: Hematology and Oncology

## 2017-06-22 ENCOUNTER — Inpatient Hospital Stay: Payer: BLUE CROSS/BLUE SHIELD

## 2017-06-22 ENCOUNTER — Ambulatory Visit (HOSPITAL_COMMUNITY)
Admission: RE | Admit: 2017-06-22 | Discharge: 2017-06-22 | Disposition: A | Discharge status: Home / Self Care | Payer: BLUE CROSS/BLUE SHIELD | Source: Ambulatory Visit | Attending: Hematology and Oncology | Admitting: Hematology and Oncology

## 2017-06-22 DIAGNOSIS — Z5112 Encounter for antineoplastic immunotherapy: Secondary | ICD-10-CM | POA: Diagnosis not present

## 2017-06-22 DIAGNOSIS — Z7952 Long term (current) use of systemic steroids: Secondary | ICD-10-CM | POA: Diagnosis not present

## 2017-06-22 DIAGNOSIS — E119 Type 2 diabetes mellitus without complications: Secondary | ICD-10-CM | POA: Diagnosis not present

## 2017-06-22 DIAGNOSIS — Z79811 Long term (current) use of aromatase inhibitors: Secondary | ICD-10-CM

## 2017-06-22 DIAGNOSIS — Z452 Encounter for adjustment and management of vascular access device: Secondary | ICD-10-CM | POA: Diagnosis not present

## 2017-06-22 DIAGNOSIS — Z7984 Long term (current) use of oral hypoglycemic drugs: Secondary | ICD-10-CM

## 2017-06-22 DIAGNOSIS — Z170 Estrogen receptor positive status [ER+]: Secondary | ICD-10-CM

## 2017-06-22 DIAGNOSIS — Z48812 Encounter for surgical aftercare following surgery on the circulatory system: Secondary | ICD-10-CM | POA: Insufficient documentation

## 2017-06-22 DIAGNOSIS — C50212 Malignant neoplasm of upper-inner quadrant of left female breast: Secondary | ICD-10-CM

## 2017-06-22 DIAGNOSIS — Z9221 Personal history of antineoplastic chemotherapy: Secondary | ICD-10-CM

## 2017-06-22 DIAGNOSIS — Z923 Personal history of irradiation: Secondary | ICD-10-CM

## 2017-06-22 DIAGNOSIS — E559 Vitamin D deficiency, unspecified: Secondary | ICD-10-CM | POA: Diagnosis not present

## 2017-06-22 DIAGNOSIS — Z95828 Presence of other vascular implants and grafts: Secondary | ICD-10-CM | POA: Insufficient documentation

## 2017-06-22 DIAGNOSIS — Z79899 Other long term (current) drug therapy: Secondary | ICD-10-CM | POA: Diagnosis not present

## 2017-06-22 DIAGNOSIS — D259 Leiomyoma of uterus, unspecified: Secondary | ICD-10-CM

## 2017-06-22 DIAGNOSIS — D649 Anemia, unspecified: Secondary | ICD-10-CM

## 2017-06-22 DIAGNOSIS — R5383 Other fatigue: Secondary | ICD-10-CM | POA: Diagnosis not present

## 2017-06-22 LAB — COMPREHENSIVE METABOLIC PANEL
ALBUMIN: 3.6 g/dL (ref 3.5–5.0)
ALT: 14 U/L (ref 0–55)
AST: 16 U/L (ref 5–34)
Alkaline Phosphatase: 102 U/L (ref 40–150)
Anion gap: 10 (ref 3–11)
BILIRUBIN TOTAL: 0.9 mg/dL (ref 0.2–1.2)
BUN: 21 mg/dL (ref 7–26)
CHLORIDE: 106 mmol/L (ref 98–109)
CO2: 27 mmol/L (ref 22–29)
CREATININE: 1.43 mg/dL — AB (ref 0.60–1.10)
Calcium: 9.4 mg/dL (ref 8.4–10.4)
GFR calc Af Amer: 48 mL/min — ABNORMAL LOW (ref >60)
GFR, EST NON AFRICAN AMERICAN: 42 mL/min — AB (ref >60)
Glucose, Bld: 93 mg/dL (ref 70–140)
POTASSIUM: 4 mmol/L (ref 3.5–5.1)
Sodium: 143 mmol/L (ref 136–145)
Total Protein: 7.6 g/dL (ref 6.4–8.3)

## 2017-06-22 LAB — CBC WITH DIFFERENTIAL/PLATELET
BASOS PCT: 1 %
Basophils Absolute: 0 10*3/uL (ref 0.0–0.1)
Eosinophils Absolute: 0.1 10*3/uL (ref 0.0–0.5)
Eosinophils Relative: 3 %
HEMATOCRIT: 38.6 % (ref 34.8–46.6)
Hemoglobin: 13 g/dL (ref 11.6–15.9)
LYMPHS PCT: 18 %
Lymphs Abs: 0.9 10*3/uL (ref 0.9–3.3)
MCH: 32.5 pg (ref 25.1–34.0)
MCHC: 33.6 g/dL (ref 31.5–36.0)
MCV: 96.9 fL (ref 79.5–101.0)
MONO ABS: 0.5 10*3/uL (ref 0.1–0.9)
Monocytes Relative: 9 %
NEUTROS ABS: 3.6 10*3/uL (ref 1.5–6.5)
Neutrophils Relative %: 69 %
PLATELETS: 203 10*3/uL (ref 145–400)
RBC: 3.98 MIL/uL (ref 3.70–5.45)
RDW: 15 % — AB (ref 11.2–14.5)
WBC: 5.2 10*3/uL (ref 3.9–10.3)

## 2017-06-22 MED ORDER — ACETAMINOPHEN 325 MG PO TABS
650.0000 mg | ORAL_TABLET | Freq: Once | ORAL | Status: AC
  Administered 2017-06-22: 650 mg via ORAL

## 2017-06-22 MED ORDER — SODIUM CHLORIDE 0.9 % IV SOLN
Freq: Once | INTRAVENOUS | Status: AC
  Administered 2017-06-22: 14:00:00 via INTRAVENOUS

## 2017-06-22 MED ORDER — HEPARIN SOD (PORK) LOCK FLUSH 100 UNIT/ML IV SOLN
500.0000 [IU] | Freq: Once | INTRAVENOUS | Status: AC | PRN
  Administered 2017-06-22: 500 [IU]
  Filled 2017-06-22: qty 5

## 2017-06-22 MED ORDER — DIPHENHYDRAMINE HCL 25 MG PO CAPS
ORAL_CAPSULE | ORAL | Status: AC
  Filled 2017-06-22: qty 2

## 2017-06-22 MED ORDER — FAMOTIDINE IN NACL 20-0.9 MG/50ML-% IV SOLN
20.0000 mg | Freq: Once | INTRAVENOUS | Status: AC
  Administered 2017-06-22: 20 mg via INTRAVENOUS

## 2017-06-22 MED ORDER — TRASTUZUMAB CHEMO 150 MG IV SOLR
600.0000 mg | Freq: Once | INTRAVENOUS | Status: AC
  Administered 2017-06-22: 600 mg via INTRAVENOUS
  Filled 2017-06-22: qty 28.57

## 2017-06-22 MED ORDER — FAMOTIDINE IN NACL 20-0.9 MG/50ML-% IV SOLN
INTRAVENOUS | Status: AC
  Filled 2017-06-22: qty 50

## 2017-06-22 MED ORDER — ACETAMINOPHEN 325 MG PO TABS
ORAL_TABLET | ORAL | Status: AC
  Filled 2017-06-22: qty 2

## 2017-06-22 MED ORDER — DIPHENHYDRAMINE HCL 25 MG PO CAPS
50.0000 mg | ORAL_CAPSULE | Freq: Once | ORAL | Status: AC
  Administered 2017-06-22: 50 mg via ORAL

## 2017-06-22 MED ORDER — SODIUM CHLORIDE 0.9% FLUSH
10.0000 mL | Freq: Once | INTRAVENOUS | Status: AC
  Administered 2017-06-22: 10 mL
  Filled 2017-06-22: qty 10

## 2017-06-22 MED ORDER — ALTEPLASE 2 MG IJ SOLR
2.0000 mg | Freq: Once | INTRAMUSCULAR | Status: DC
  Administered 2017-06-22: 2 mg
  Filled 2017-06-22: qty 2

## 2017-06-22 MED ORDER — ALTEPLASE 2 MG IJ SOLR
INTRAMUSCULAR | Status: AC
Start: 2017-06-22 — End: 2017-06-22
  Filled 2017-06-22: qty 2

## 2017-06-22 MED ORDER — SODIUM CHLORIDE 0.9% FLUSH
10.0000 mL | INTRAVENOUS | Status: DC | PRN
  Administered 2017-06-22: 10 mL
  Filled 2017-06-22: qty 10

## 2017-06-22 NOTE — Progress Notes (Signed)
Port flushed several times and repositioned with no blood return. Labs will be drawn peripherally by Lab 1. Cathflow will be administered by Dr. Lindi Adie desk nurse ([place]). Tiffany Cates LPN

## 2017-06-22 NOTE — Patient Instructions (Signed)
[facility] Discharge Instructions for Patients Receiving Chemotherapy  Today you received the following chemotherapy agents: Trastuzumab (Herceptin).  To help prevent nausea and vomiting after your treatment, we encourage you to take your nausea medication as prescribed. If you develop nausea and vomiting that is not controlled by your nausea medication, call the clinic.   BELOW ARE SYMPTOMS THAT SHOULD BE REPORTED IMMEDIATELY:  *FEVER GREATER THAN 100.5 F  *CHILLS WITH OR WITHOUT FEVER  NAUSEA AND VOMITING THAT IS NOT CONTROLLED WITH YOUR NAUSEA MEDICATION  *UNUSUAL SHORTNESS OF BREATH  *UNUSUAL BRUISING OR BLEEDING  TENDERNESS IN MOUTH AND THROAT WITH OR WITHOUT PRESENCE OF ULCERS  *URINARY PROBLEMS  *BOWEL PROBLEMS  UNUSUAL RASH Items with * indicate a potential emergency and should be followed up as soon as possible.  Feel free to call the clinic should you have any questions or concerns. The clinic phone number is [phone].  Please show the [facility] at check-in to the Emergency Department and triage nurse.

## 2017-06-22 NOTE — Progress Notes (Signed)
Patient Care Team: McLean-Scocuzza, Nino Glow, MD as PCP - General (Internal Medicine) Vonna Drafts, FNP as Nurse Practitioner (Nurse Practitioner) Stark Klein, MD as Consulting Physician (General Surgery) Nicholas Lose, MD as Consulting Physician (Hematology and Oncology) Gery Pray, MD as Consulting Physician (Radiation Oncology)  DIAGNOSIS:  Encounter Diagnosis  Name Primary?  . Malignant neoplasm of upper-inner quadrant of left breast in female, estrogen receptor positive ([place])     SUMMARY OF ONCOLOGIC HISTORY:   Malignant neoplasm of upper-inner quadrant of left breast in female, estrogen receptor positive ([place])   03/04/2016 Initial Diagnosis    Screening detected left breast density posterior medial 1.4 cm by ultrasound axilla negative, grade 3 IDC ER 60%, PR 0%, Ki-67 30%, HER-2 positive ratio 6.04, copy #16; T1 cN0 stage IA clinical stage      03/21/2016 Genetic Testing    NEgative genetic testing on the comprehensive cancer panel and Negative genetic testing for the MSH2 inversion analysis ([place] inversion). The Comprehensive Cancer Panel offered by [facility] includes sequencing and/or deletion duplication testing of the following 32 genes: APC, ATM, AXIN2, BARD1, BMPR1A, BRCA1, BRCA2, BRIP1, CDH1, CDK4, CDKN2A, CHEK2, EPCAM, FANCC, MLH1, MSH2, MSH6, MUTYH, NBN, PALB2, PMS2, POLD1, POLE, PTEN, RAD51C, RAD51D, SCG5/GREM1, SMAD4, STK11, TP53, VHL, and XRCC2.   The report date is March 21, 2016.      07/27/2016 Surgery    Left lumpectomy: IDC grade 2, 2.6 cm, DCIS intermediate grade,0/3 lymph nodes negative, ER 60%, PR 0%, Ki-67 30%, HER-2 positive ratio 6.04, T2 N0 stage II a      09/01/2016 - 11/03/2016 Chemotherapy    Abraxane Herceptin weekly 12 followed by Herceptin maintenance every 3 weeks       11/19/2016 - 01/20/2017 Radiation Therapy    Adjuvant radiation therapy      01/29/2017 -  Anti-estrogen oral therapy    Anastrozole 2.5 mg daily       CHIEF  COMPLIANT: Follow-up on Herceptin and anastrozole  INTERVAL HISTORY: [name] is a 52 year old with above-mentioned history of left breast cancer treated with lumpectomy followed by adjuvant chemotherapy and radiation.  She is currently on anastrozole therapy.  She is continuing maintenance Herceptin which will be completed in May 2019.  Her only issue is that the port does not appear to be working well.  She also had diarrhea after the last few rounds of Herceptin.  She denies any nausea vomiting.  Her hair is coming back.  REVIEW OF SYSTEMS:   Constitutional: Denies fevers, chills or abnormal weight loss Eyes: Denies blurriness of vision Ears, nose, mouth, throat, and face: Denies mucositis or sore throat Respiratory: Denies cough, dyspnea or wheezes Cardiovascular: Denies palpitation, chest discomfort Gastrointestinal:  Denies nausea, heartburn or change in bowel habits Skin: Denies abnormal skin rashes Lymphatics: Denies new lymphadenopathy or easy bruising Neurological:Denies numbness, tingling or new weaknesses Behavioral/Psych: Mood is stable, no new changes  Extremities: No lower extremity edema  All other systems were reviewed with the patient and are negative.  I have reviewed the past medical history, past surgical history, social history and family history with the patient and they are unchanged from previous note.  ALLERGIES:  is allergic to amoxicillin; ampicillin; and sulfa antibiotics.  MEDICATIONS:  Current Outpatient Medications  Medication Sig Dispense Refill  . acetaminophen (TYLENOL) 500 MG tablet Take 1,000 mg by mouth every 6 (six) hours as needed for moderate pain or headache.     . albuterol (PROVENTIL HFA;VENTOLIN HFA) 108 (90 Base) MCG/ACT inhaler  Inhale 2 puffs into the lungs every 6 (six) hours as needed for wheezing or shortness of breath. 1 Inhaler 2  . allopurinol (ZYLOPRIM) 100 MG tablet Take 1 tablet (100 mg total) by mouth daily. 90 tablet 1  .  anastrozole (ARIMIDEX) 1 MG tablet Take 1 mg by mouth daily.  1  . Azelastine-Fluticasone (DYMISTA) 137-50 MCG/ACT SUSP Place 1 spray into the nose 2 (two) times daily. 1 Bottle 11  . cetirizine (ZYRTEC) 10 MG tablet Take 10 mg by mouth daily.    . colchicine 0.6 MG tablet Daily 90 tablet 0  . fluticasone (FLONASE) 50 MCG/ACT nasal spray Place into both nostrils daily.    . Fluticasone-Salmeterol (ADVAIR DISKUS) 250-50 MCG/DOSE AEPB Inhale 1 puff into the lungs 2 (two) times daily. Rinse mouth 60 each 5  . hyaluronate sodium (RADIAPLEXRX) GEL Apply 1 application topically once.    Marland Kitchen ibuprofen (ADVIL,MOTRIN) 800 MG tablet Take 800 mg by mouth every 8 (eight) hours as needed for moderate pain.    Marland Kitchen losartan (COZAAR) 100 MG tablet Take 1 tablet (100 mg total) by mouth daily. (Patient taking differently: Take 50 mg by mouth daily. ) 30 tablet 0  . metFORMIN (GLUCOPHAGE) 500 MG tablet Take 1 tablet (500 mg total) by mouth 2 (two) times daily with a meal. 60 tablet 2  . metoprolol tartrate (LOPRESSOR) 25 MG tablet Take 1 tablet (25 mg total) by mouth 2 (two) times daily. 180 tablet 1  . OPSUMIT 10 MG TABS Take 10 mg by mouth daily.  8  . Polyethyl Glycol-Propyl Glycol (SYSTANE OP) Apply 1 drop to eye daily as needed (dry eyes).    . predniSONE (DELTASONE) 20 MG tablet 40 mg ( 2pills) x 5 days, then 20 mg (1 pill x 4 days), then 1/2 pill x 1 day then stop. Take in am with food 15 tablet 0  . sildenafil (REVATIO) 20 MG tablet Take 20 mg by mouth 3 (three) times daily.    Marland Kitchen spironolactone (ALDACTONE) 50 MG tablet Take 1 tablet (50 mg total) by mouth daily. 90 tablet 1  . torsemide (DEMADEX) 20 MG tablet Take 20 mg by mouth 2 (two) times daily.    Marland Kitchen UPTRAVI 200 MCG TABS Take 200 mcg by mouth 2 (two) times daily.  10   No current facility-administered medications for this visit.     PHYSICAL EXAMINATION: ECOG PERFORMANCE STATUS: 1 - Symptomatic but completely ambulatory  Vitals:   06/22/17 1155  BP:  106/84  Pulse: 86  Resp: 17  Temp: 98.5 F (36.9 C)  SpO2: 92%   Filed Weights   06/22/17 1155  Weight: 230 lb 11.2 oz (104.6 kg)    GENERAL:alert, no distress and comfortable SKIN: skin color, texture, turgor are normal, no rashes or significant lesions EYES: normal, Conjunctiva are pink and non-injected, sclera clear OROPHARYNX:no exudate, no erythema and lips, buccal mucosa, and tongue normal  NECK: supple, thyroid normal size, non-tender, without nodularity LYMPH:  no palpable lymphadenopathy in the cervical, axillary or inguinal LUNGS: clear to auscultation and percussion with normal breathing effort HEART: regular rate & rhythm and no murmurs and no lower extremity edema ABDOMEN:abdomen soft, non-tender and normal bowel sounds MUSCULOSKELETAL:no cyanosis of digits and no clubbing  NEURO: alert & oriented x 3 with fluent speech, no focal motor/sensory deficits EXTREMITIES: No lower extremity edema  LABORATORY DATA:  I have reviewed the data as listed CMP Latest Ref Rng & Units 06/01/2017 05/11/2017 03/31/2017  Glucose 70 -  140 mg/dL 95 129 129  BUN 7 - 26 mg/dL 25 13 18.1  Creatinine 0.60 - 1.10 mg/dL 1.01 1.07 1.0  Sodium 136 - 145 mmol/L 142 138 139  Potassium 3.5 - 5.1 mmol/L 4.3 4.1 3.6  Chloride 98 - 109 mmol/L 104 104 -  CO2 22 - 29 mmol/L _0 Calcium 8.4 - 10.4 mg/dL 9.6 9.1 9.2  Total Protein 6.4 - 8.3 g/dL 8.0 7.7 7.8  Total Bilirubin 0.2 - 1.2 mg/dL 0.6 1.2 0.97  Alkaline Phos 40 - 150 U/L 107 102 96  AST 5 - 34 U/L _1 ALT 0 - 55 U/L _2 Lab Results  Component Value Date   WBC 5.2 06/22/2017   HGB 13.0 06/22/2017   HCT 38.6 06/22/2017   MCV 96.9 06/22/2017   PLT 203 06/22/2017   NEUTROABS 3.6 06/22/2017    ASSESSMENT & PLAN:  Malignant neoplasm of upper-inner quadrant of left breast in female, estrogen receptor positive ([place]) Left lumpectomy 07/28/2016: IDC grade 2, 2.6 cm, DCIS intermediate grade,0/3 lymph nodes negative, ER 60%,  PR 0%, Ki-67 30%, HER-2 positive ratio 6.04, T2 N0 stage II a  Treatment summary: 1. Adjuvant chemotherapy with Abraxane and Herceptin (cannot take Taxol because she could not receive steroids)weekly 12 followed by every 3 week Herceptin for one yearuntil May 2019 2. followed by adjuvant radiation08/29/2018-01/05/2017 3. Followed by adjuvant antiestrogen therapystarted 01/28/2017 ------------------------------------------------------------------------------------------------------------------------------- Treatment plan: Adjuvant antiestrogen therapy withanastrozole 1 mg daily 01/28/2017 Adjuvant Herceptin to be completed May 2019  Anastrozole toxicities: Denies any hot flashes arthralgias or myalgias.  Surveillance: 1.  Breast exam 06/22/2017: Benign 2. mammograms to be done in April  Return to clinic every 3 weeks for Herceptin and I will see her with her last Herceptin treatment on Aug 24, 2017.  Subsequently she can have the port removed.   I spent 25 minutes talking to the patient of which more than half was spent in counseling and coordination of care.  No orders of the defined types were placed in this encounter.  The patient has a good understanding of the overall plan. she agrees with it. she will call with any problems that may develop before the next visit here.   Harriette Ohara, MD 06/22/17

## 2017-06-22 NOTE — Assessment & Plan Note (Signed)
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 Adjuvant chemotherapy with Abraxane and Herceptin (cannot take Taxol because she could not receive steroids)weekly 12 followed by every 3 week Herceptin for one yearuntil May 2019 2. followed by adjuvant radiation08/29/2018-01/05/2017 3. Followed by adjuvant antiestrogen therapystarted 01/28/2017 ------------------------------------------------------------------------------------------------------------------------------- Treatment plan: Adjuvant antiestrogen therapy withanastrozole 1 mg daily 01/28/2017  Anastrozole toxicities:  Surveillance: 1.  Breast exam 06/22/2017: Benign 2. mammograms to be done in April  Return to clinic every 3 weeks for Herceptin and every 6 weeks for follow-up with me

## 2017-06-22 NOTE — Progress Notes (Signed)
Went to recheck bloodflow from [facility] around 12:50. Unable to obtain blood return and pt began c/o of feeling "water on my ears".  Last CXR to confirm tip placement was 07/27/16 and showed tip in SVC. Dr. Lindi Adie called with update and gave verbal orders for pt to have 1-view CXR. Orders placed and pt updated on plan. Will check for blood return again upon pt's return from radiology.   Blood return recheck at 14:10 when pt returned from radiology (tip confirmed in SVC) and blood return noted. Orders released and pt updated on plan. Will continue to monitor.

## 2017-06-23 ENCOUNTER — Telehealth: Payer: Self-pay | Admitting: Hematology and Oncology

## 2017-06-23 ENCOUNTER — Encounter: Payer: Self-pay | Admitting: Internal Medicine

## 2017-06-23 DIAGNOSIS — R1114 Bilious vomiting: Secondary | ICD-10-CM | POA: Insufficient documentation

## 2017-06-23 DIAGNOSIS — R519 Headache, unspecified: Secondary | ICD-10-CM | POA: Insufficient documentation

## 2017-06-23 DIAGNOSIS — R6881 Early satiety: Secondary | ICD-10-CM | POA: Insufficient documentation

## 2017-06-23 DIAGNOSIS — R51 Headache: Secondary | ICD-10-CM

## 2017-06-23 DIAGNOSIS — J309 Allergic rhinitis, unspecified: Secondary | ICD-10-CM | POA: Insufficient documentation

## 2017-06-23 NOTE — Telephone Encounter (Signed)
Mailed calendar to patient of upcoming April and May appointments.

## 2017-06-23 NOTE — Progress Notes (Addendum)
Chief Complaint  Patient presents with  . Follow-up   Follow up  1. C/o abdominal pain and nausea with greasy foods x >1 year with belching 2. C/o new h/a with pressure w/in the last couple of weeks with h/o breast cancer 3. H/o breast cancer in active chemo and pt needs to sch f/u mammogram at [facility] encouraged to do this asap 4. She would like to pursue colonoscopy  5. Gout improved with prednisone done with, though only taking colchicine not with allopurinol encouraged to take with allopurinal x 3 months then just allopurinol.  6. H/o allergies with nasal sx's was Rx dymista nasal spray in the past but old insurance would not cover will try to get approved again  Review of Systems  Constitutional: Negative for weight loss.  HENT: Negative for hearing loss.   Eyes: Negative for blurred vision.  Respiratory: Negative for shortness of breath.   Cardiovascular: Negative for chest pain.  Gastrointestinal: Positive for abdominal pain and nausea.  Musculoskeletal: Negative for joint pain.  Skin: Negative for rash.  Neurological: Positive for headaches.  Psychiatric/Behavioral: Negative for depression.   Past Medical History:  Diagnosis Date  . Anemia    iron deficiency  . Arthritis   . Borderline diabetes   . Complication of anesthesia    woke up slowly- after hysterectomy- 2015  . Diabetes mellitus, type II ([place])   . DVT (deep venous thrombosis) ([place]) 2014   left leg  . Family history of breast cancer   . Gout   . HTN (hypertension)   . 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 Menorrhagia    secondary to uterine fibroids  . OSA (obstructive sleep apnea)    07/25/13 HST AHI 33/hr, severe hypoxemia O2 min 42% and 95% of the time <89%  . PE (pulmonary embolism) 2014   bilateral  . Pulmonary artery hypertension ([place])   . Pulmonary nodule    (30m on loeft lower lobe) found on CT scan July 2014, repeat scan Jan 2015 showed less than 49m   . Right ovarian cyst    noted 09/2012   . S/P TAH (total abdominal hysterectomy) 06/07/2013  . Trichomoniasis    05/2011    Past Surgical History:  Procedure Laterality Date  . ABDOMINAL HYSTERECTOMY N/A 06/07/2013   Procedure: HYSTERECTOMY ABDOMINAL WITH BIALTERAL SALPINGECTOMY;  Surgeon: VaElveria RoyalsMD;  [facility];  Service: Gynecology;  Laterality: N/A;  . BREAST LUMPECTOMY Left 07/27/2016   BREAST LUMPECTOMY WITH RADIOACTIVE SEED AND SENTINEL LYMPH NODE BIOPSY (Left)  . BREAST LUMPECTOMY WITH RADIOACTIVE SEED AND SENTINEL LYMPH NODE BIOPSY Left 07/27/2016   Procedure: BREAST LUMPECTOMY WITH RADIOACTIVE SEED AND SENTINEL LYMPH NODE BIOPSY;  Surgeon: FaStark KleinMD;  [facility] Service: General;  Laterality: Left;  . CARDIAC CATHETERIZATION N/A 12/24/2015   Procedure: Right Heart Cath;  Surgeon: JaAdrian ProwsMD;  [facility];  Service: Cardiovascular;  Laterality: N/A;  . CESAREAN SECTION    . POLYPECTOMY  2008   Removal of uterine polyp  . PORTA CATH INSERTION  07/27/2016  . PORTACATH PLACEMENT Right 07/27/2016   Procedure: INSERTION PORT-A-CATH;  Surgeon: FaStark KleinMD;  Location: [facility];  Service: General;  Laterality: Right;   Family History  Problem Relation Age of Onset  . Hypertension Mother   . Kidney disease Mother   . Hypertension Father   . Stroke Sister   . Breast cancer Maternal  Grandmother        died at 42  . Breast cancer Paternal Grandmother   . Breast cancer Cousin        pat first cousin dx in her 60s  . Hypertension Brother   . Cancer Maternal Aunt        unknown form  . Breast cancer Paternal Aunt   . Colon cancer Other 31       MGMs brother  . Breast cancer Paternal Aunt   . Cancer Other        breast ca in [place]  . Cancer Other        g uncle colon or stomach ca   Social History   Socioeconomic History  . Marital status: Single    Spouse name: Not on file  . Number of children: 1  . Years of education: Not on  file  . Highest education level: Not on file  Occupational History  . Not on file  Social Needs  . Financial resource strain: Not on file  . Food insecurity:    Worry: Not on file    Inability: Not on file  . Transportation needs:    Medical: Not on file    Non-medical: Not on file  Tobacco Use  . Smoking status: Former Smoker    Packs/day: 0.33    Years: 10.00    Pack years: 3.30    Types: Cigarettes    Last attempt to quit: 10/04/2011    Years since quitting: 5.7  . Smokeless tobacco: Never Used  Substance and Sexual Activity  . Alcohol use: Yes    Alcohol/week: 0.0 oz    Comment: social  . Drug use: No  . Sexual activity: Yes  Lifestyle  . Physical activity:    Days per week: Not on file    Minutes per session: Not on file  . Stress: Not on file  Relationships  . Social connections:    Talks on phone: Not on file    Gets together: Not on file    Attends religious service: Not on file    Active member of club or organization: Not on file    Attends meetings of clubs or organizations: Not on file    Relationship status: Not on file  . Intimate partner violence:    Fear of current or ex partner: Not on file    Emotionally abused: Not on file    Physically abused: Not on file    Forced sexual activity: Not on file  Other Topics Concern  . 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 acetaminophen (TYLENOL) 500 MG tablet Take 1,000 mg by mouth every 6 (six) hours as needed for moderate pain or headache.   . albuterol (PROVENTIL HFA;VENTOLIN HFA) 108 (90 Base) MCG/ACT inhaler Inhale 2 puffs into the lungs every 6 (six) hours as needed for wheezing or shortness of breath.  . allopurinol (ZYLOPRIM) 100 MG tablet Take 1 tablet (100 mg total) by mouth daily.  Marland Kitchen anastrozole (ARIMIDEX) 1 MG  tablet Take 1 mg by mouth daily.  . cetirizine (ZYRTEC) 10 MG tablet Take 10 mg by mouth daily.  . colchicine 0.6 MG tablet Daily  . fluticasone (FLONASE) 50 MCG/ACT nasal spray  Place into both nostrils daily.  . Fluticasone-Salmeterol (ADVAIR DISKUS) 250-50 MCG/DOSE AEPB Inhale 1 puff into the lungs 2 (two) times daily. Rinse mouth  . hyaluronate sodium (RADIAPLEXRX) GEL Apply 1 application topically once.  Marland Kitchen ibuprofen (ADVIL,MOTRIN) 800 MG tablet Take 800 mg by mouth every 8 (eight) hours as needed for moderate pain.  Marland Kitchen losartan (COZAAR) 100 MG tablet Take 1 tablet (100 mg total) by mouth daily. (Patient taking differently: Take 50 mg by mouth daily. )  . metFORMIN (GLUCOPHAGE) 500 MG tablet Take 1 tablet (500 mg total) by mouth 2 (two) times daily with a meal.  . metoprolol tartrate (LOPRESSOR) 25 MG tablet Take 1 tablet (25 mg total) by mouth 2 (two) times daily.  . OPSUMIT 10 MG TABS Take 10 mg by mouth daily.  Vladimir Faster Glycol-Propyl Glycol (SYSTANE OP) Apply 1 drop to eye daily as needed (dry eyes).  . predniSONE (DELTASONE) 20 MG tablet 40 mg ( 2pills) x 5 days, then 20 mg (1 pill x 4 days), then 1/2 pill x 1 day then stop. Take in am with food  . sildenafil (REVATIO) 20 MG tablet Take 20 mg by mouth 3 (three) times daily.  Marland Kitchen spironolactone (ALDACTONE) 50 MG tablet Take 1 tablet (50 mg total) by mouth daily.  Marland Kitchen torsemide (DEMADEX) 20 MG tablet Take 20 mg by mouth 2 (two) times daily.  Marland Kitchen UPTRAVI 400 MCG TABS Take 400 mcg by mouth 2 (two) times daily.   . [DISCONTINUED] Azelastine-Fluticasone 137-50 MCG/ACT SUSP Place 1 spray into the nose 2 (two) times daily.   Allergies  Allergen Reactions  . Amoxicillin Other (See Comments)    LIGHT-HEADED and CLOSE TO "PASSING OUT" ABDOMINAL PAIN  Has patient had a PCN reaction causing immediate rash, facial/tongue/throat swelling, SOB or lightheadedness with hypotension: No Has patient had a PCN reaction causing severe rash involving mucus  membranes or skin necrosis: No Has patient had a PCN reaction that required hospitalization No Has patient had a PCN reaction occurring within the last 10 years: No If all of the above answers are "NO", then may proceed with Cephalosporin use.  . Ampicillin Other (See Comments)    Severe abdominal pain, dizziness (penicillin is okay)   . Sulfa Antibiotics Rash and Other (See Comments)    Very bad yeast infection    Recent Results (from the past 2160 hour(s))  Comprehensive metabolic panel     Status: None   Collection Time: 03/31/17  1:32 PM  Result Value Ref Range   Sodium 139 136 - 145 mEq/L   Potassium 3.6 3.5 - 5.1 mEq/L   Chloride 106 98 - 109 mEq/L   CO2 26 22 - 29 mEq/L   Glucose 129 70 - 140 mg/dl    Comment: Glucose reference range is for nonfasting patients. Fasting glucose reference range is 70- 100.   BUN 18.1 7.0 - 26.0 mg/dL   Creatinine 1.0 0.6 - 1.1 mg/dL   Total Bilirubin 0.97 0.20 - 1.20 mg/dL   Alkaline Phosphatase 96 40 - 150 U/L   AST 16 5 - 34 U/L   ALT 14 0 - 55 U/L   Total Protein 7.8 6.4 - 8.3 g/dL   Albumin 3.5 3.5 - 5.0 g/dL   Calcium 9.2 8.4 - 10.4 mg/dL   Anion Gap 8 3 - 11 mEq/L   EGFR >60 >60 ml/min/1.73 m2    Comment: eGFR is calculated using the CKD-EPI Creatinine Equation (2009)  CBC with Differential  Status: Abnormal   Collection Time: 03/31/17  1:32 PM  Result Value Ref Range   WBC 6.4 3.9 - 10.3 10e3/uL   NEUT# 4.6 1.5 - 6.5 10e3/uL   HGB 13.4 11.6 - 15.9 g/dL   HCT 39.7 34.8 - 46.6 %   Platelets 143 (L) 145 - 400 10e3/uL   MCV 99.3 79.5 - 101.0 fL   MCH 33.5 25.1 - 34.0 pg   MCHC 33.8 31.5 - 36.0 g/dL   RBC 4.00 3.70 - 5.45 10e6/uL   RDW 14.0 11.2 - 14.5 %   lymph# 1.1 0.9 - 3.3 10e3/uL   MONO# 0.5 0.1 - 0.9 10e3/uL   Eosinophils Absolute 0.2 0.0 - 0.5 10e3/uL   Basophils Absolute 0.0 0.0 - 0.1 10e3/uL   NEUT% 72.0 38.4 - 76.8 %   LYMPH% 16.6 14.0 - 49.7 %   MONO% 8.1 0.0 - 14.0 %   EOS% 3.0 0.0 - 7.0 %   BASO% 0.3 0.0 -  2.0 %  Comprehensive metabolic panel     Status: Abnormal   Collection Time: 05/11/17  1:13 PM  Result Value Ref Range   Sodium 138 136 - 145 mmol/L   Potassium 4.1 3.5 - 5.1 mmol/L   Chloride 104 98 - 109 mmol/L   CO2 24 22 - 29 mmol/L   Glucose, Bld 129 70 - 140 mg/dL   BUN 13 7 - 26 mg/dL   Creatinine, Ser 1.07 0.60 - 1.10 mg/dL   Calcium 9.1 8.4 - 10.4 mg/dL   Total Protein 7.7 6.4 - 8.3 g/dL   Albumin 3.3 (L) 3.5 - 5.0 g/dL   AST 23 5 - 34 U/L   ALT 14 0 - 55 U/L   Alkaline Phosphatase 102 40 - 150 U/L   Total Bilirubin 1.2 0.2 - 1.2 mg/dL   GFR calc non Af Amer 59 (L) >60 mL/min   GFR calc Af Amer >60 >60 mL/min    Comment: (NOTE) The eGFR has been calculated using the CKD EPI equation. This calculation has not been validated in all clinical situations. eGFR's persistently <60 mL/min signify possible Chronic Kidney Disease.    Anion gap 10 3 - 11    Comment: Performed at [facility], [address], [place], [place] [address]  CBC with Differential     Status: Abnormal   Collection Time: 05/11/17  1:13 PM  Result Value Ref Range   WBC 4.1 3.9 - 10.3 K/uL   RBC 3.93 3.70 - 5.45 MIL/uL   Hemoglobin 12.8 11.6 - 15.9 g/dL   HCT 38.2 34.8 - 46.6 %   MCV 97.2 79.5 - 101.0 fL   MCH 32.5 25.1 - 34.0 pg   MCHC 33.4 31.5 - 36.0 g/dL   RDW 13.5 11.2 - 14.5 %   Platelets 140 (L) 145 - 400 K/uL   Neutrophils Relative % 62 %   Neutro Abs 2.6 1.5 - 6.5 K/uL   Lymphocytes Relative 22 %   Lymphs Abs 0.9 0.9 - 3.3 K/uL   Monocytes Relative 11 %   Monocytes Absolute 0.5 0.1 - 0.9 K/uL   Eosinophils Relative 4 %   Eosinophils Absolute 0.1 0.0 - 0.5 K/uL   Basophils Relative 1 %   Basophils Absolute 0.0 0.0 - 0.1 K/uL    Comment: Performed at [facility], [place] [address], [place], [place] [address]  Uric acid     Status: Abnormal   Collection Time: 05/24/17  4:57 PM  Result Value Ref Range   Uric Acid, Serum 8.9 (H) 2.4 - 7.0  mg/dL  Comprehensive metabolic panel     Status: Abnormal   Collection Time: 06/01/17 10:24 AM  Result Value Ref Range   Sodium 142 136 - 145 mmol/L   Potassium 4.3 3.5 - 5.1 mmol/L   Chloride 104 98 - 109 mmol/L   CO2 25 22 - 29 mmol/L   Glucose, Bld 95 70 - 140 mg/dL   BUN 25 7 - 26 mg/dL   Creatinine, Ser 1.01 0.60 - 1.10 mg/dL   Calcium 9.6 8.4 - 10.4 mg/dL   Total Protein 8.0 6.4 - 8.3 g/dL   Albumin 3.6 3.5 - 5.0 g/dL   AST 20 5 - 34 U/L   ALT 26 0 - 55 U/L   Alkaline Phosphatase 107 40 - 150 U/L   Total Bilirubin 0.6 0.2 - 1.2 mg/dL   GFR calc non Af Amer >60 >60 mL/min   GFR calc Af Amer >60 >60 mL/min    Comment: (NOTE) The eGFR has been calculated using the CKD EPI equation. This calculation has not been validated in all clinical situations. eGFR's persistently <60 mL/min signify possible Chronic Kidney Disease.    Anion gap 13 (H) 3 - 11    Comment: Performed at [facility], [place] [address] [place], [place] [address]  CBC with Differential     Status: Abnormal   Collection Time: 06/01/17 10:24 AM  Result Value Ref Range   WBC 10.2 3.9 - 10.3 K/uL   RBC 4.16 3.70 - 5.45 MIL/uL   Hemoglobin 13.3 11.6 - 15.9 g/dL   HCT 40.4 34.8 - 46.6 %   MCV 97.1 79.5 - 101.0 fL   MCH 32.0 25.1 - 34.0 pg   MCHC 32.9 31.5 - 36.0 g/dL   RDW 13.1 11.2 - 14.5 %   Platelets 182 145 - 400 K/uL   Neutrophils Relative % 73 %   Neutro Abs 7.5 (H) 1.5 - 6.5 K/uL   Lymphocytes Relative 19 %   Lymphs Abs 1.9 0.9 - 3.3 K/uL   Monocytes Relative 7 %   Monocytes Absolute 0.7 0.1 - 0.9 K/uL   Eosinophils Relative 1 %   Eosinophils Absolute 0.1 0.0 - 0.5 K/uL   Basophils Relative 0 %   Basophils Absolute 0.0 0.0 - 0.1 K/uL    Comment: Performed at [facility], [place] [address], [place], [place] [address]  Urinalysis, Routine w reflex microscopic     Status: Abnormal   Collection Time: 06/21/17 11:25 AM  Result Value Ref Range   Color,  Urine YELLOW Yellow;Lt. Yellow   APPearance Sl Cloudy (A) Clear   Specific Gravity, Urine 1.020 1.000 - 1.030   pH 5.5 5.0 - 8.0   Total Protein, Urine TRACE (A) Negative   Urine Glucose NEGATIVE Negative   Ketones, ur NEGATIVE Negative   Bilirubin Urine NEGATIVE Negative   Hgb urine dipstick NEGATIVE Negative   Urobilinogen, UA 0.2 0.0 - 1.0   Leukocytes, UA MODERATE (A) Negative   Nitrite NEGATIVE Negative   WBC, UA 11-20/hpf (A) 0-2/hpf   RBC / HPF none seen 0-2/hpf   Squamous Epithelial / LPF Rare(0-4/hpf) Rare(0-4/hpf)   Hyaline Casts, UA Presence of (A) None  Urine Microalbumin w/creat. ratio     Status: Abnormal   Collection Time: 06/21/17 11:25 AM  Result Value Ref Range   Microalb, Ur 20.1 (H) 0.0 - 1.9 mg/dL  Creatinine,U 166.6 mg/dL   Microalb Creat Ratio 12.1 0.0 - 30.0 mg/g  CBC with Differential     Status: Abnormal   Collection Time: 06/22/17 11:45 AM  Result Value Ref Range   WBC 5.2 3.9 - 10.3 K/uL   RBC 3.98 3.70 - 5.45 MIL/uL   Hemoglobin 13.0 11.6 - 15.9 g/dL   HCT 38.6 34.8 - 46.6 %   MCV 96.9 79.5 - 101.0 fL   MCH 32.5 25.1 - 34.0 pg   MCHC 33.6 31.5 - 36.0 g/dL   RDW 15.0 (H) 11.2 - 14.5 %   Platelets 203 145 - 400 K/uL   Neutrophils Relative % 69 %   Neutro Abs 3.6 1.5 - 6.5 K/uL   Lymphocytes Relative 18 %   Lymphs Abs 0.9 0.9 - 3.3 K/uL   Monocytes Relative 9 %   Monocytes Absolute 0.5 0.1 - 0.9 K/uL   Eosinophils Relative 3 %   Eosinophils Absolute 0.1 0.0 - 0.5 K/uL   Basophils Relative 1 %   Basophils Absolute 0.0 0.0 - 0.1 K/uL    Comment: Performed at [facility], [place] [address], [place], [place] [address]  Comprehensive metabolic panel     Status: Abnormal   Collection Time: 06/22/17 11:45 AM  Result Value Ref Range   Sodium 143 136 - 145 mmol/L   Potassium 4.0 3.5 - 5.1 mmol/L   Chloride 106 98 - 109 mmol/L   CO2 27 22 - 29 mmol/L   Glucose, Bld 93 70 - 140 mg/dL   BUN 21 7 - 26 mg/dL   Creatinine, Ser  1.43 (H) 0.60 - 1.10 mg/dL   Calcium 9.4 8.4 - 10.4 mg/dL   Total Protein 7.6 6.4 - 8.3 g/dL   Albumin 3.6 3.5 - 5.0 g/dL   AST 16 5 - 34 U/L   ALT 14 0 - 55 U/L   Alkaline Phosphatase 102 40 - 150 U/L   Total Bilirubin 0.9 0.2 - 1.2 mg/dL   GFR calc non Af Amer 42 (L) >60 mL/min   GFR calc Af Amer 48 (L) >60 mL/min    Comment: (NOTE) The eGFR has been calculated using the CKD EPI equation. This calculation has not been validated in all clinical situations. eGFR's persistently <60 mL/min signify possible Chronic Kidney Disease.    Anion gap 10 3 - 11    Comment: Performed at [facility], [address], [place], [place] [address]   Objective  Body mass index is 38.31 kg/m. Wt Readings from Last 3 Encounters:  06/22/17 230 lb 11.2 oz (104.6 kg)  06/21/17 230 lb 3.2 oz (104.4 kg)  06/01/17 228 lb 14.4 oz (103.8 kg)   Temp Readings from Last 3 Encounters:  06/22/17 98.5 F (36.9 C) (Oral)  06/21/17 98 F (36.7 C) (Oral)  06/01/17 98 F (36.7 C) (Oral)   BP Readings from Last 3 Encounters:  06/22/17 106/84  06/21/17 106/60  06/01/17 136/84   Pulse Readings from Last 3 Encounters:  06/22/17 86  06/21/17 88  06/01/17 99   O2 sat room air 91%  Physical Exam  Constitutional: She is oriented to person, place, and time and well-developed, well-nourished, and in no distress. Vital signs are normal.  HENT:  Head: Normocephalic and atraumatic.  Mouth/Throat: Oropharynx is clear and moist and mucous membranes are normal.  Eyes: Pupils are equal, round, and reactive to light. Conjunctivae are normal.  Cardiovascular: Normal rate, regular rhythm and normal heart  sounds.  Pulmonary/Chest: Effort normal and breath sounds normal.  Abdominal: Bowel sounds are normal. There is no tenderness.  Musculoskeletal:       Feet:  Neurological: She is alert and oriented to person, place, and time. Gait normal. Gait normal.  CN 2-12 grossly intact  Moving all 4  extremities with nl motor strength   Skin: Skin is warm, dry and intact.  Psychiatric: Mood, memory, affect and judgment normal.  Nursing note and vitals reviewed.   Assessment   1. Epigastric abdominal pain and nausea and vomiting intermittently and early satiety need to r/o GI cause  2. New h/a with pressure with h/o breast cancer  3. Breast cancer  4. Gout improved left MTP toe 5. Allergic rhinitis  6. HM  Plan  1. Order US abdomen to assess GB Refer to GI as well to assess if needs EGD   2. Order MRI with and w/o contrast due to h/o breast cancer r/o mets  3. Encouraged pt to sch mammogram at [facility] F/u H/o  4. Cont colchicine with allopurinol  5. Rx dymista  6.  flu shot had 01/17/18  Tdap given today Consider  shingrix in future  Check labs given form to check with chemo lipid, A1C (h.o DM2-also check UA, urine creatinine today in our clinic), TSH, T4, vit D has lab order Dr .Harrietta Guardian for proBNP   Pap smear  -s/p TAH (w/o cervix only ovaries intact) DUB 2/2 fibroids, adenomyosis  -follows with Dr. Josephina Shih OB/GYN pap neg 08/2008 last saw 01/2017 Dr. Benjie Karvonen need to get records  -obtained records 04/14/17 visit   mammo encouraged pt to sch at [facility] asap   Colonoscopy referred today Dr. Nolen Mu [facility] in [place] will likely need cardiac (Dr. Ceasar Mons) and pulmonary clearance  (Dr. Lacinda Axon)  Of note eye exam had 01/2017 need to get report at f/u   She is former smoker from mid 66s age 74 to 66 3 cig per day   Of not pulm HTN just f/u Dr. Harrietta Guardian will fax note to her today and see what she rec. Based on recent echo. She has pending proBNP ordered by Dr. Harrietta Guardian.  Also will ask dr. Harrietta Guardian about clearance for colonoscopy and if pt still should be taking Xarelto at one time she was for h/o DVT/PE but no longer taking Pt does f/u with cardiology Dr. Laurelyn Sickle   Will bill for physical at f/u too many acute issues today   "I spent 35 minutes face-to face with  patient with greater than 50% of time spent counseling and/or in coordination of care plan of care, referrals   Provider: Dr. Olivia Mackie McLean-Scocuzza-Internal Medicine

## 2017-06-24 LAB — HGB A1C W/O EAG: HEMOGLOBIN A1C: 6.3 % — AB (ref 4.8–5.6)

## 2017-06-24 LAB — LIPID PANEL W/O CHOL/HDL RATIO
Cholesterol, Total: 148 mg/dL (ref 100–199)
HDL: 36 mg/dL — ABNORMAL LOW (ref >39)
LDL Calculated: 86 mg/dL (ref 0–99)
Triglycerides: 128 mg/dL (ref 0–149)
VLDL Cholesterol Cal: 26 mg/dL (ref 5–40)

## 2017-06-24 LAB — SPECIMEN STATUS REPORT

## 2017-06-24 LAB — T4: T4 TOTAL: 6.8 ug/dL (ref 4.5–12.0)

## 2017-06-24 LAB — TSH: TSH: 1.35 u[IU]/mL (ref 0.450–4.500)

## 2017-06-24 LAB — VITAMIN D 25 HYDROXY (VIT D DEFICIENCY, FRACTURES): VIT D 25 HYDROXY: 18.3 ng/mL — AB (ref 30.0–100.0)

## 2017-06-24 LAB — PRO B NATRIURETIC PEPTIDE: NT-PRO BNP: 1430 pg/mL — AB (ref 0–249)

## 2017-06-26 DIAGNOSIS — G4733 Obstructive sleep apnea (adult) (pediatric): Secondary | ICD-10-CM | POA: Diagnosis not present

## 2017-06-27 ENCOUNTER — Other Ambulatory Visit: Payer: Self-pay | Admitting: Internal Medicine

## 2017-06-27 DIAGNOSIS — E559 Vitamin D deficiency, unspecified: Secondary | ICD-10-CM

## 2017-06-27 MED ORDER — CHOLECALCIFEROL 1.25 MG (50000 UT) PO CAPS: 50000.0000 [IU] | ORAL_CAPSULE | ORAL | 1 refills | Status: DC

## 2017-06-28 DIAGNOSIS — Z780 Asymptomatic menopausal state: Secondary | ICD-10-CM | POA: Diagnosis not present

## 2017-06-28 DIAGNOSIS — R928 Other abnormal and inconclusive findings on diagnostic imaging of breast: Secondary | ICD-10-CM | POA: Diagnosis not present

## 2017-06-28 DIAGNOSIS — Z853 Personal history of malignant neoplasm of breast: Secondary | ICD-10-CM | POA: Diagnosis not present

## 2017-06-28 LAB — HM MAMMOGRAPHY

## 2017-06-29 ENCOUNTER — Other Ambulatory Visit: Payer: Self-pay | Admitting: Gastroenterology

## 2017-06-29 DIAGNOSIS — R6881 Early satiety: Secondary | ICD-10-CM | POA: Diagnosis not present

## 2017-06-29 DIAGNOSIS — K5900 Constipation, unspecified: Secondary | ICD-10-CM | POA: Diagnosis not present

## 2017-07-01 ENCOUNTER — Ambulatory Visit (HOSPITAL_COMMUNITY): Admission: RE | Admit: 2017-07-01 | Payer: BLUE CROSS/BLUE SHIELD | Source: Ambulatory Visit

## 2017-07-01 ENCOUNTER — Telehealth: Payer: Self-pay

## 2017-07-01 ENCOUNTER — Ambulatory Visit (HOSPITAL_COMMUNITY): Payer: BLUE CROSS/BLUE SHIELD | Attending: Internal Medicine

## 2017-07-01 NOTE — Telephone Encounter (Signed)
I sent the lab results to [facility] but I did not send to Dr. Sondra Come.

## 2017-07-01 NOTE — Telephone Encounter (Signed)
Results have been sent to Dr. Sondra Come via electronically.

## 2017-07-01 NOTE — Telephone Encounter (Signed)
Unable to leave message for patient to return call back, VM box was full. PEC may give results from the below.   Lab results  Cholesterol good HDL low 36   A1C 6.3 continue same medications for now  Vit D low need to Rx high dose weekly D3 sent [facility]   Thyroid labs normal  proBNP elevated 1430 f/u Dr. Harrietta Guardian and heart doctor about this

## 2017-07-01 NOTE — Telephone Encounter (Signed)
Patient notified of her results. She has requested that her labs be forwarded to Dr Harrietta Guardian and Dr Sondra Come for review.

## 2017-07-01 NOTE — Telephone Encounter (Signed)
-----  Message from [name], MD sent at 07/01/2017  8:56 AM EDT ----- Lab results  Cholesterol good HDL low 36   A1C 6.3 continue same medications for now  Vit D low need to Rx high dose weekly D3 sent [facility]   Thyroid labs normal  proBNP elevated 1430 f/u Dr. Harrietta Guardian and heart doctor about this   [place]

## 2017-07-06 DIAGNOSIS — J449 Chronic obstructive pulmonary disease, unspecified: Secondary | ICD-10-CM | POA: Diagnosis not present

## 2017-07-06 DIAGNOSIS — R0902 Hypoxemia: Secondary | ICD-10-CM | POA: Diagnosis not present

## 2017-07-06 MED FILL — VITAMIN D3 50000 UNIT CAPS: 1.25 MG | 90 days supply | Qty: 13 | Fill #0

## 2017-07-11 ENCOUNTER — Emergency Department (HOSPITAL_COMMUNITY)
Admission: EM | Admit: 2017-07-11 | Discharge: 2017-07-11 | Disposition: A | Discharge status: Home / Self Care | Payer: BLUE CROSS/BLUE SHIELD | Attending: Emergency Medicine | Admitting: Emergency Medicine

## 2017-07-11 ENCOUNTER — Encounter (HOSPITAL_COMMUNITY): Payer: Self-pay | Admitting: Emergency Medicine

## 2017-07-11 ENCOUNTER — Emergency Department (HOSPITAL_COMMUNITY): Payer: BLUE CROSS/BLUE SHIELD

## 2017-07-11 DIAGNOSIS — Z853 Personal history of malignant neoplasm of breast: Secondary | ICD-10-CM | POA: Diagnosis not present

## 2017-07-11 DIAGNOSIS — I10 Essential (primary) hypertension: Secondary | ICD-10-CM | POA: Diagnosis not present

## 2017-07-11 DIAGNOSIS — E119 Type 2 diabetes mellitus without complications: Secondary | ICD-10-CM | POA: Diagnosis not present

## 2017-07-11 DIAGNOSIS — Z79899 Other long term (current) drug therapy: Secondary | ICD-10-CM | POA: Insufficient documentation

## 2017-07-11 DIAGNOSIS — Z87891 Personal history of nicotine dependence: Secondary | ICD-10-CM | POA: Diagnosis not present

## 2017-07-11 DIAGNOSIS — Z7984 Long term (current) use of oral hypoglycemic drugs: Secondary | ICD-10-CM | POA: Diagnosis not present

## 2017-07-11 DIAGNOSIS — R061 Stridor: Secondary | ICD-10-CM | POA: Diagnosis not present

## 2017-07-11 DIAGNOSIS — R51 Headache: Secondary | ICD-10-CM | POA: Diagnosis not present

## 2017-07-11 DIAGNOSIS — R220 Localized swelling, mass and lump, head: Secondary | ICD-10-CM | POA: Diagnosis not present

## 2017-07-11 DIAGNOSIS — J302 Other seasonal allergic rhinitis: Secondary | ICD-10-CM

## 2017-07-11 DIAGNOSIS — R600 Localized edema: Secondary | ICD-10-CM | POA: Diagnosis not present

## 2017-07-11 LAB — CBC
HEMATOCRIT: 40.1 % (ref 36.0–46.0)
Hemoglobin: 13.3 g/dL (ref 12.0–15.0)
MCH: 32.9 pg (ref 26.0–34.0)
MCHC: 33.2 g/dL (ref 30.0–36.0)
MCV: 99.3 fL (ref 78.0–100.0)
Platelets: 175 10*3/uL (ref 150–400)
RBC: 4.04 MIL/uL (ref 3.87–5.11)
RDW: 14.3 % (ref 11.5–15.5)
WBC: 4.4 10*3/uL (ref 4.0–10.5)

## 2017-07-11 LAB — BASIC METABOLIC PANEL
ANION GAP: 9 (ref 5–15)
BUN: 14 mg/dL (ref 6–20)
CALCIUM: 9.3 mg/dL (ref 8.9–10.3)
CO2: 26 mmol/L (ref 22–32)
Chloride: 107 mmol/L (ref 101–111)
Creatinine, Ser: 0.9 mg/dL (ref 0.44–1.00)
GFR calc Af Amer: >60 mL/min (ref >60)
GFR calc non Af Amer: >60 mL/min (ref >60)
GLUCOSE: 111 mg/dL — AB (ref 65–99)
POTASSIUM: 4.4 mmol/L (ref 3.5–5.1)
Sodium: 142 mmol/L (ref 135–145)

## 2017-07-11 MED ORDER — PREDNISONE 20 MG PO TABS
60.0000 mg | ORAL_TABLET | Freq: Once | ORAL | Status: AC
  Administered 2017-07-11: 60 mg via ORAL
  Filled 2017-07-11: qty 3

## 2017-07-11 MED ORDER — IOHEXOL 300 MG/ML  SOLN
75.0000 mL | Freq: Once | INTRAMUSCULAR | Status: AC | PRN
  Administered 2017-07-11: 75 mL via INTRAVENOUS

## 2017-07-11 MED ORDER — SODIUM CHLORIDE 0.9 % IV SOLN
INTRAVENOUS | Status: DC
  Administered 2017-07-11: 09:00:00 via INTRAVENOUS

## 2017-07-11 MED ORDER — PREDNISONE 10 MG PO TABS: 40.0000 mg | ORAL_TABLET | Freq: Every day | ORAL | 0 refills | Status: DC

## 2017-07-11 NOTE — ED Triage Notes (Signed)
Pt reports that she noticed swelling in her face over the past week but today while getting ready for worse her throat felt like getting tighter and closing up. Pt denies any trouble swallowing. Also has headache esp around her eyes.

## 2017-07-11 NOTE — Discharge Instructions (Addendum)
I think a trial of prednisone as directed.  Call your regular doctor for follow-up tomorrow.  Work note provided.  Reasonable to consider stopping the allopurinol.  Return for any new or worse symptoms.

## 2017-07-11 NOTE — ED Provider Notes (Signed)
[facility] DEPT Provider Note   CSN: [id number] Arrival date & time: 07/11/17  8676     History   Chief Complaint Chief Complaint  Patient presents with  . Oral Swelling    HPI [name] is a 52 y.o. female.  Patient works as a Occupational psychologist.  Patient onset over the past week of some swelling in her face and some fullness feeling in her throat.  Had some trouble swallowing things got worse this morning.  Also has pressure sensation behind her eyes and pressure sensation in the ears.  No severe headache.  No runny nose.  Eyes were puffy and swollen this morning.  Patient has a history of seasonal allergies.     Past Medical History:  Diagnosis Date  . Anemia    iron deficiency  . Arthritis   . Borderline diabetes   . Complication of anesthesia    woke up slowly- after hysterectomy- 2015  . Diabetes mellitus, type II ([place])   . DVT (deep venous thrombosis) ([place]) 2014   left leg  . Family history of breast cancer   . Gout   . HTN (hypertension)   . 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 Menorrhagia    secondary to uterine fibroids  . OSA (obstructive sleep apnea)    07/25/13 HST AHI 33/hr, severe hypoxemia O2 min 42% and 95% of the time <89%  . PE (pulmonary embolism) 2014   bilateral  . Pulmonary artery hypertension ([place])   . Pulmonary nodule    (63m on loeft lower lobe) found on CT scan July 2014, repeat scan Jan 2015 showed less than 432m . Right ovarian cyst    noted 09/2012   . S/P TAH (total abdominal hysterectomy) 06/07/2013  . Trichomoniasis    05/2011     Patient Active Problem List   Diagnosis Date Noted  . Acute nonintractable headache 06/23/2017  . Bilious vomiting with nausea 06/23/2017  . Early satiety 06/23/2017  . Allergic rhinitis 06/23/2017  . OSA on CPAP 06/06/2017  . DM2 (diabetes mellitus, type 2) ([facility]03/12/2017  . Iron deficiency anemia 06/06/2017  .  Osteoarthritis 06/06/2017  . Cardiomegaly 06/06/2017  . Ectasis aorta ([facility]03/12/2017  . Stasis dermatitis of both legs 06/06/2017  . HLD (hyperlipidemia) 06/06/2017  . Pulmonary hypertension ([facility]02/27/2019  . Essential hypertension 05/26/2017  . Gout 05/24/2017  . Chemotherapy-induced peripheral neuropathy ([facility]08/09/2016  . Port catheter in place 09/15/2016  . Breast cancer of upper-inner quadrant of left female breast ([facility]04/30/2018  . Genetic testing 03/25/2016  . Family history of breast cancer   . Malignant neoplasm of upper-inner quadrant of left breast in female, estrogen receptor positive ([facility]12/10/2015  . Acute on chronic respiratory failure ([facility]11/05/2015  . Dyspnea on exertion 12/09/2015  . S/P TAH (total abdominal hysterectomy) 06/07/2013  . Acute blood loss anemia 05/14/2013  . Abnormal uterine bleeding 10/13/2012  . Fibroids 10/13/2012  . Anemia 10/13/2012  . VTE (venous thromboembolism) 10/04/2012  . Pulmonary nodule 10/04/2012  . Hypokalemia 10/04/2012    Past Surgical History:  Procedure Laterality Date  . ABDOMINAL HYSTERECTOMY N/A 06/07/2013   Procedure: HYSTERECTOMY ABDOMINAL WITH BIALTERAL SALPINGECTOMY;  Surgeon: VaElveria RoyalsMD;  [facility];  Service: Gynecology;  Laterality: N/A;  . BREAST LUMPECTOMY Left 07/27/2016   BREAST LUMPECTOMY WITH RADIOACTIVE SEED AND SENTINEL LYMPH NODE BIOPSY (Left)  . BREAST LUMPECTOMY WITH RADIOACTIVE SEED AND SENTINEL  LYMPH NODE BIOPSY Left 07/27/2016   Procedure: BREAST LUMPECTOMY WITH RADIOACTIVE SEED AND SENTINEL LYMPH NODE BIOPSY;  Surgeon: Stark Klein, MD;  Location: [facility];  Service: General;  Laterality: Left;  . CARDIAC CATHETERIZATION N/A 12/24/2015   Procedure: Right Heart Cath;  Surgeon: [name], MD;  Location: [facility];  Service: Cardiovascular;  Laterality: N/A;  . CESAREAN SECTION    . POLYPECTOMY  2008   Removal of uterine polyp  . PORTA CATH INSERTION  07/27/2016  . PORTACATH  PLACEMENT Right 07/27/2016   Procedure: INSERTION PORT-A-CATH;  Surgeon: Stark Klein, MD;  Location: [facility];  Service: General;  Laterality: Right;     OB History    Gravida  3   Para  1   Term  1   Preterm  0   AB  2   Living  1     SAB  1   TAB  1   Ectopic  0   Multiple  0   Live Births               Home Medications    Prior to Admission medications   Medication Sig Start Date End Date Taking? Authorizing Provider  acetaminophen (TYLENOL) 500 MG tablet Take 1,000 mg by mouth every 6 (six) hours as needed for moderate pain or headache.    Yes [provider]  albuterol (PROVENTIL HFA;VENTOLIN HFA) 108 (90 Base) MCG/ACT inhaler Inhale 2 puffs into the lungs every 6 (six) hours as needed for wheezing or shortness of breath. 06/06/15  Yes Fisher, Linden Dolin, PA-C  allopurinol (ZYLOPRIM) 100 MG tablet Take 1 tablet (100 mg total) by mouth daily. 05/24/17  Yes McLean-Scocuzza, Nino Glow, MD  anastrozole (ARIMIDEX) 1 MG tablet Take 1 mg by mouth daily. 06/27/16  Yes [provider]  cetirizine (ZYRTEC) 10 MG tablet Take 10 mg by mouth daily.   Yes [provider]  colchicine 0.6 MG tablet Daily 05/24/17  Yes McLean-Scocuzza, Nino Glow, MD  fluticasone (FLONASE) 50 MCG/ACT nasal spray Place into both nostrils daily.   Yes [provider]  Fluticasone-Salmeterol (ADVAIR DISKUS) 250-50 MCG/DOSE AEPB Inhale 1 puff into the lungs 2 (two) times daily. Rinse mouth 05/31/17  Yes McLean-Scocuzza, Nino Glow, MD  ibuprofen (ADVIL,MOTRIN) 800 MG tablet Take 800 mg by mouth every 8 (eight) hours as needed for moderate pain.   Yes [provider]  losartan (COZAAR) 100 MG tablet Take 1 tablet (100 mg total) by mouth daily. Patient taking differently: Take 50 mg by mouth daily.  03/29/15  Yes Blima Singer, NP  metFORMIN (GLUCOPHAGE) 500 MG tablet Take 1 tablet (500 mg total) by mouth 2 (two) times daily with a meal. 02/03/16  Yes Demetrios Loll, MD    metoprolol tartrate (LOPRESSOR) 25 MG tablet Take 1 tablet (25 mg total) by mouth 2 (two) times daily. 05/26/17  Yes McLean-Scocuzza, Nino Glow, MD  OPSUMIT 10 MG TABS Take 10 mg by mouth daily. 06/03/16  Yes [provider]  Polyethyl Glycol-Propyl Glycol (SYSTANE OP) Apply 1 drop to eye daily as needed (dry eyes).   Yes [provider]  sildenafil (REVATIO) 20 MG tablet Take 20 mg by mouth 3 (three) times daily. 05/27/16  Yes [provider]  spironolactone (ALDACTONE) 50 MG tablet Take 1 tablet (50 mg total) by mouth daily. 05/26/17  Yes McLean-Scocuzza, Nino Glow, MD  torsemide (DEMADEX) 20 MG tablet Take 20 mg by mouth 2 (two) times daily.  Yes [provider]  UPTRAVI 400 MCG TABS Take 400 mcg by mouth 2 (two) times daily.  06/10/16  Yes [provider]  Azelastine-Fluticasone (DYMISTA) 137-50 MCG/ACT SUSP Place 1 spray into the nose 2 (two) times daily. 06/21/17   McLean-Scocuzza, Nino Glow, MD  Cholecalciferol 50000 units capsule Take 1 capsule (50,000 Units total) by mouth once a week. 06/27/17   McLean-Scocuzza, Nino Glow, MD  predniSONE (DELTASONE) 10 MG tablet Take 4 tablets (40 mg total) by mouth daily. 07/11/17   Fredia Sorrow, MD  predniSONE (DELTASONE) 20 MG tablet 40 mg ( 2pills) x 5 days, then 20 mg (1 pill x 4 days), then 1/2 pill x 1 day then stop. Take in am with food 05/24/17   McLean-Scocuzza, Nino Glow, MD  prochlorperazine (COMPAZINE) 10 MG tablet Take 1 tablet (10 mg total) by mouth every 6 (six) hours as needed (Nausea or vomiting). 08/04/16 08/17/16  Nicholas Lose, MD    Family History Family History  Problem Relation Age of Onset  . Hypertension Mother   . Kidney disease Mother   . Hypertension Father   . Stroke Sister   . Breast cancer Maternal Grandmother        died at 17  . Breast cancer Paternal Grandmother   . Breast cancer Cousin        pat first cousin dx in her 47s  . Hypertension Brother   . Cancer Maternal Aunt         unknown form  . Breast cancer Paternal Aunt   . Colon cancer Other 57       MGMs brother  . Breast cancer Paternal Aunt   . Cancer Other        breast ca in [place]  . Cancer Other        g uncle colon or stomach ca    Social History Social History   Tobacco Use  . Smoking status: Former Smoker    Packs/day: 0.33    Years: 10.00    Pack years: 3.30    Types: Cigarettes    Last attempt to quit: 10/04/2011    Years since quitting: 5.7  . Smokeless tobacco: Never Used  Substance Use Topics  . Alcohol use: Yes    Alcohol/week: 0.0 oz    Comment: social  . Drug use: No     Allergies   Ampicillin; Amoxicillin; Other; and Sulfa antibiotics   Review of Systems Review of Systems  Constitutional: Negative for fever.  HENT: Positive for facial swelling and trouble swallowing. Negative for congestion.   Eyes: Negative for redness.  Respiratory: Negative for shortness of breath.   Cardiovascular: Negative for chest pain.  Gastrointestinal: Negative for abdominal pain.  Genitourinary: Negative for dysuria.  Musculoskeletal: Negative for myalgias.  Skin: Negative for rash.  Allergic/Immunologic: Positive for environmental allergies.  Neurological: Negative for headaches.  Hematological: Does not bruise/bleed easily.  Psychiatric/Behavioral: Negative for confusion.     Physical Exam Updated Vital Signs BP (!) 147/101   Pulse 75   Temp 98.1 F (36.7 C) (Oral)   Resp 16   Ht 1.651 m (_0 )   Wt 103.4 kg (228 lb)   LMP 05/11/2013   SpO2 97%   BMI 37.94 kg/m   Physical Exam  Constitutional: She is oriented to person, place, and time. She appears well-developed and well-nourished. No distress.  HENT:  Head: Normocephalic and atraumatic.  Right Ear: External ear normal.  Left Ear: External ear normal.  Mouth/Throat:  Oropharynx is clear and moist. No oropharyngeal exudate.  Eyes: Pupils are equal, round, and reactive to light. EOM are normal.  Neck: Normal range of  motion. Neck supple.  Cardiovascular: Normal rate, regular rhythm and normal heart sounds.  Pulmonary/Chest: Effort normal and breath sounds normal. No respiratory distress. She has no wheezes.  Abdominal: Soft. Bowel sounds are normal. There is no tenderness.  Musculoskeletal: Normal range of motion. She exhibits no edema.  Neurological: She is alert and oriented to person, place, and time. No cranial nerve deficit or sensory deficit. She exhibits normal muscle tone. Coordination normal.  Skin: Skin is warm.  Nursing note and vitals reviewed.    ED Treatments / Results  Labs (all labs ordered are listed, but only abnormal results are displayed) Labs Reviewed  BASIC METABOLIC PANEL - Abnormal; Notable for the following components:      Result Value   Glucose, Bld 111 (*)    All other components within normal limits  CBC    EKG None  Radiology Ct Head Wo Contrast  Result Date: 07/11/2017 CLINICAL DATA:  Acute severe headache worst of life, especially headache around the eyes, history breast cancer, type II diabetes mellitus, pulmonary arterial hypertension, former smoker EXAM: CT HEAD WITHOUT CONTRAST TECHNIQUE: Contiguous axial images were obtained from the base of the skull through the vertex without intravenous contrast. Sagittal and coronal MPR images reconstructed from axial data set. COMPARISON:  01/12/2014 FINDINGS: Brain: Normal ventricular morphology. No midline shift or mass effect. Normal appearance of brain parenchyma. No intracranial hemorrhage, mass lesion, evidence of acute infarction, or extra-axial fluid collection. Vascular: Normal appearance.  No hyperdense vessels. Skull: Intact Sinuses/Orbits: Clear Other: N/A IMPRESSION: Normal exam. Electronically Signed   By: Lavonia Dana M.D.   On: 07/11/2017 11:03   Ct Soft Tissue Neck W Contrast  Result Date: 07/11/2017 CLINICAL DATA:  52 year old female with facial swelling over the past week, progressive throat swelling  today. Stridor. Headache. EXAM: CT NECK WITH CONTRAST TECHNIQUE: Multidetector CT imaging of the neck was performed using the standard protocol following the bolus administration of intravenous contrast. CONTRAST:  31m OMNIPAQUE IOHEXOL 300 MG/ML  SOLN COMPARISON:  Head CT without contrast 07/11/2017, and earlier. Chest CTA 02/10/2016. FINDINGS: Pharynx and larynx: There is a retropharyngeal effusion from the C2 to the C5 level of the neck (series 3, image 53). No significant mass effect on the posterior wall of the pharynx. The bilateral parapharyngeal spaces remain normal. The hypopharynx, oropharynx, and nasopharynx contours appear normal. No tonsillar hyperenhancement. The epiglottis and larynx appear normal. Salivary glands: Negative sublingual space. The bilateral submandibular glands and parotid glands appear symmetric and within normal limits. Incidental prominent left retromandibular vein noted (normal variant). Thyroid: Negative. Lymph nodes: No cervical lymphadenopathy. Vascular: The major vascular structures in the neck and at the skull base are patent. There is a partially retropharyngeal course of the left carotid. The carotid bifurcations appear normal. There is a right subclavian approach chest porta cath in place. Limited intracranial: Negative. Visualized orbits: Negative. Mastoids and visualized paranasal sinuses: Clear. Skeleton: No acute dental findings. Advanced disc and endplate degeneration in the lower cervical spine at C5-C6. Trace vacuum disc. No dystrophic calcifications of the longus coli muscles to suggest hydroxyapatite deposition disease. No elongated stylohyoid ligament calcification. No acute osseous abnormality identified. Upper chest: Chronic enlargement of the main pulmonary artery, incompletely Visualized today. Stable superior mediastinum. No superior mediastinal lymphadenopathy. Stable and negative lung apices. Mild atelectatic changes to the trachea. IMPRESSION:  1. Positive  for a small retropharyngeal effusion. This is nonspecific. Such an appearance can be seen with acute URI, but there is no evidence of acute tonsillitis, and no pharyngeal/laryngeal inflammation evident. 2. No cervical lymphadenopathy and otherwise negative CT appearance of the neck. 3. Advanced chronic cervical spine disc and endplate degeneration at C5-C6. 4. Chronic main pulmonary artery enlargement compatible with pulmonary artery hypertension. Electronically Signed   By: Genevie Ann M.D.   On: 07/11/2017 11:16    Procedures Procedures (including critical care time)  Medications Ordered in ED Medications  0.9 %  sodium chloride infusion ( Intravenous New Bag/Given 07/11/17 0851)  predniSONE (DELTASONE) tablet 60 mg (has no administration in time range)  iohexol (OMNIPAQUE) 300 MG/ML solution 75 mL (75 mLs Intravenous Contrast Given 07/11/17 1023)     Initial Impression / Assessment and Plan / ED Course  I have reviewed the triage vital signs and the nursing notes.  Pertinent labs & imaging results that were available during my care of the patient were reviewed by me and considered in my medical decision making (see chart for details).    Patient workup extensive to include CT head CT soft tissue neck.  No acute findings.  Labs without any significant abnormalities.  Possible symptoms could be related to allopurinol.  Possible that it could be allergy related.  Patient certainly nontoxic no acute distress stable for discharge home.  Will give a 5-day course of prednisone.  Given initial dose of prednisone here.  She will follow-up with her primary care doctor.   Final Clinical Impressions(s) / ED Diagnoses   Final diagnoses:  Facial swelling  Seasonal allergies    ED Discharge Orders        Ordered    predniSONE (DELTASONE) 10 MG tablet  Daily     07/11/17 1235       Fredia Sorrow, MD 07/11/17 1749

## 2017-07-13 ENCOUNTER — Inpatient Hospital Stay: Payer: BLUE CROSS/BLUE SHIELD | Attending: Hematology and Oncology

## 2017-07-13 VITALS — BP 141/90 | HR 78 | Temp 98.0°F | Resp 17 | Ht 65.0 in | Wt 228.0 lb

## 2017-07-13 DIAGNOSIS — C50212 Malignant neoplasm of upper-inner quadrant of left female breast: Secondary | ICD-10-CM | POA: Insufficient documentation

## 2017-07-13 DIAGNOSIS — Z923 Personal history of irradiation: Secondary | ICD-10-CM | POA: Insufficient documentation

## 2017-07-13 DIAGNOSIS — Z79811 Long term (current) use of aromatase inhibitors: Secondary | ICD-10-CM | POA: Diagnosis not present

## 2017-07-13 DIAGNOSIS — Z9221 Personal history of antineoplastic chemotherapy: Secondary | ICD-10-CM | POA: Insufficient documentation

## 2017-07-13 DIAGNOSIS — Z170 Estrogen receptor positive status [ER+]: Secondary | ICD-10-CM | POA: Insufficient documentation

## 2017-07-13 MED ORDER — ACETAMINOPHEN 325 MG PO TABS
650.0000 mg | ORAL_TABLET | Freq: Once | ORAL | Status: AC
  Administered 2017-07-13: 650 mg via ORAL

## 2017-07-13 MED ORDER — FAMOTIDINE IN NACL 20-0.9 MG/50ML-% IV SOLN
20.0000 mg | Freq: Once | INTRAVENOUS | Status: AC
  Administered 2017-07-13: 20 mg via INTRAVENOUS

## 2017-07-13 MED ORDER — SODIUM CHLORIDE 0.9% FLUSH
10.0000 mL | INTRAVENOUS | Status: DC | PRN
  Filled 2017-07-13: qty 10

## 2017-07-13 MED ORDER — ALTEPLASE 2 MG IJ SOLR
2.0000 mg | Freq: Once | INTRAMUSCULAR | Status: AC | PRN
  Administered 2017-07-13: 2 mg
  Filled 2017-07-13: qty 2

## 2017-07-13 MED ORDER — HEPARIN SOD (PORK) LOCK FLUSH 100 UNIT/ML IV SOLN
500.0000 [IU] | Freq: Once | INTRAVENOUS | Status: AC | PRN
Start: 2017-07-13 — End: 2017-07-13
  Administered 2017-07-13: 500 [IU]
  Filled 2017-07-13: qty 5

## 2017-07-13 MED ORDER — DIPHENHYDRAMINE HCL 25 MG PO CAPS
ORAL_CAPSULE | ORAL | Status: AC
  Filled 2017-07-13: qty 2

## 2017-07-13 MED ORDER — DIPHENHYDRAMINE HCL 25 MG PO CAPS
50.0000 mg | ORAL_CAPSULE | Freq: Once | ORAL | Status: AC
  Administered 2017-07-13: 50 mg via ORAL

## 2017-07-13 MED ORDER — FAMOTIDINE IN NACL 20-0.9 MG/50ML-% IV SOLN
INTRAVENOUS | Status: AC
  Filled 2017-07-13: qty 50

## 2017-07-13 MED ORDER — ACETAMINOPHEN 325 MG PO TABS
ORAL_TABLET | ORAL | Status: AC
  Filled 2017-07-13: qty 2

## 2017-07-13 MED ORDER — TRASTUZUMAB CHEMO 150 MG IV SOLR
600.0000 mg | Freq: Once | INTRAVENOUS | Status: AC
  Administered 2017-07-13: 600 mg via INTRAVENOUS
  Filled 2017-07-13: qty 28.57

## 2017-07-13 MED ORDER — SODIUM CHLORIDE 0.9 % IV SOLN
Freq: Once | INTRAVENOUS | Status: AC
  Administered 2017-07-13: 10:00:00 via INTRAVENOUS

## 2017-07-13 MED ORDER — ALTEPLASE 2 MG IJ SOLR
INTRAMUSCULAR | Status: AC
  Filled 2017-07-13: qty 2

## 2017-07-13 NOTE — Progress Notes (Signed)
No blood return from port after 2+ hours TPA instillation. TPA withdrawn, port flushed and heparinized. Elmyra Ricks, Infusion RN made provider aware. Pt understands to expect call from office or radiology with next steps.

## 2017-07-13 NOTE — Patient Instructions (Signed)
[facility] Discharge Instructions for Patients Receiving Chemotherapy  Today you received the following chemotherapy agents:  Herceptin  To help prevent nausea and vomiting after your treatment, we encourage you to take your nausea medication as prescribed.   If you develop nausea and vomiting that is not controlled by your nausea medication, call the clinic.   BELOW ARE SYMPTOMS THAT SHOULD BE REPORTED IMMEDIATELY:  *FEVER GREATER THAN 100.5 F  *CHILLS WITH OR WITHOUT FEVER  NAUSEA AND VOMITING THAT IS NOT CONTROLLED WITH YOUR NAUSEA MEDICATION  *UNUSUAL SHORTNESS OF BREATH  *UNUSUAL BRUISING OR BLEEDING  TENDERNESS IN MOUTH AND THROAT WITH OR WITHOUT PRESENCE OF ULCERS  *URINARY PROBLEMS  *BOWEL PROBLEMS  UNUSUAL RASH Items with * indicate a potential emergency and should be followed up as soon as possible.  Feel free to call the clinic should you have any questions or concerns. The clinic phone number is [phone].  Please show the [facility] at check-in to the Emergency Department and triage nurse.

## 2017-07-14 ENCOUNTER — Other Ambulatory Visit: Payer: Self-pay

## 2017-07-14 DIAGNOSIS — C50212 Malignant neoplasm of upper-inner quadrant of left female breast: Secondary | ICD-10-CM

## 2017-07-14 DIAGNOSIS — Z170 Estrogen receptor positive status [ER+]: Principal | ICD-10-CM

## 2017-07-23 ENCOUNTER — Other Ambulatory Visit: Payer: Self-pay | Admitting: Internal Medicine

## 2017-07-27 ENCOUNTER — Other Ambulatory Visit: Payer: Self-pay | Admitting: Hematology and Oncology

## 2017-07-27 ENCOUNTER — Encounter (HOSPITAL_COMMUNITY): Payer: Self-pay | Admitting: Diagnostic Radiology

## 2017-07-27 ENCOUNTER — Ambulatory Visit (HOSPITAL_COMMUNITY)
Admission: RE | Admit: 2017-07-27 | Discharge: 2017-07-27 | Disposition: A | Discharge status: Home / Self Care | Payer: BLUE CROSS/BLUE SHIELD | Source: Ambulatory Visit | Attending: Hematology and Oncology | Admitting: Hematology and Oncology

## 2017-07-27 DIAGNOSIS — Z95828 Presence of other vascular implants and grafts: Secondary | ICD-10-CM | POA: Diagnosis not present

## 2017-07-27 DIAGNOSIS — Z170 Estrogen receptor positive status [ER+]: Secondary | ICD-10-CM | POA: Diagnosis not present

## 2017-07-27 DIAGNOSIS — C50212 Malignant neoplasm of upper-inner quadrant of left female breast: Secondary | ICD-10-CM | POA: Insufficient documentation

## 2017-07-27 DIAGNOSIS — T82868A Thrombosis of vascular prosthetic devices, implants and grafts, initial encounter: Secondary | ICD-10-CM | POA: Diagnosis not present

## 2017-07-27 DIAGNOSIS — G4733 Obstructive sleep apnea (adult) (pediatric): Secondary | ICD-10-CM | POA: Diagnosis not present

## 2017-07-27 HISTORY — PX: IR CV LINE INJECTION: IMG2294

## 2017-07-27 MED ORDER — HEPARIN SOD (PORK) LOCK FLUSH 100 UNIT/ML IV SOLN
INTRAVENOUS | Status: AC | PRN
  Administered 2017-07-27: 500 [IU]

## 2017-07-27 MED ORDER — HEPARIN SOD (PORK) LOCK FLUSH 100 UNIT/ML IV SOLN
INTRAVENOUS | Status: AC
  Filled 2017-07-27: qty 5

## 2017-07-27 MED ORDER — IOPAMIDOL (ISOVUE-300) INJECTION 61%
7.0000 mL | Freq: Once | INTRAVENOUS | Status: AC | PRN
  Administered 2017-07-27: 7 mL via INTRAVENOUS

## 2017-07-27 MED ORDER — IOPAMIDOL (ISOVUE-300) INJECTION 61%
INTRAVENOUS | Status: AC
  Filled 2017-07-27: qty 50

## 2017-08-03 ENCOUNTER — Ambulatory Visit (HOSPITAL_COMMUNITY)
Admission: RE | Admit: 2017-08-03 | Discharge: 2017-08-03 | Disposition: A | Discharge status: Home / Self Care | Payer: BLUE CROSS/BLUE SHIELD | Source: Ambulatory Visit | Attending: Internal Medicine | Admitting: Internal Medicine

## 2017-08-03 ENCOUNTER — Ambulatory Visit: Payer: Self-pay

## 2017-08-03 ENCOUNTER — Inpatient Hospital Stay: Payer: BLUE CROSS/BLUE SHIELD

## 2017-08-03 ENCOUNTER — Inpatient Hospital Stay: Payer: BLUE CROSS/BLUE SHIELD | Attending: Hematology and Oncology

## 2017-08-03 ENCOUNTER — Other Ambulatory Visit: Payer: Self-pay

## 2017-08-03 VITALS — BP 157/80 | HR 93 | Temp 97.8°F | Resp 18

## 2017-08-03 DIAGNOSIS — Z79811 Long term (current) use of aromatase inhibitors: Secondary | ICD-10-CM | POA: Insufficient documentation

## 2017-08-03 DIAGNOSIS — R1114 Bilious vomiting: Secondary | ICD-10-CM

## 2017-08-03 DIAGNOSIS — Z170 Estrogen receptor positive status [ER+]: Secondary | ICD-10-CM | POA: Diagnosis not present

## 2017-08-03 DIAGNOSIS — C50212 Malignant neoplasm of upper-inner quadrant of left female breast: Secondary | ICD-10-CM | POA: Insufficient documentation

## 2017-08-03 DIAGNOSIS — Z5111 Encounter for antineoplastic chemotherapy: Secondary | ICD-10-CM | POA: Insufficient documentation

## 2017-08-03 DIAGNOSIS — K824 Cholesterolosis of gallbladder: Secondary | ICD-10-CM | POA: Diagnosis not present

## 2017-08-03 LAB — CBC WITH DIFFERENTIAL/PLATELET
BASOS ABS: 0 10*3/uL (ref 0.0–0.1)
BASOS PCT: 1 %
EOS ABS: 0.2 10*3/uL (ref 0.0–0.5)
Eosinophils Relative: 3 %
HEMATOCRIT: 45.4 % (ref 34.8–46.6)
HEMOGLOBIN: 14.9 g/dL (ref 11.6–15.9)
Lymphocytes Relative: 22 %
Lymphs Abs: 1 10*3/uL (ref 0.9–3.3)
MCH: 32.8 pg (ref 25.1–34.0)
MCHC: 32.8 g/dL (ref 31.5–36.0)
MCV: 100 fL (ref 79.5–101.0)
Monocytes Absolute: 0.3 10*3/uL (ref 0.1–0.9)
Monocytes Relative: 7 %
NEUTROS ABS: 2.9 10*3/uL (ref 1.5–6.5)
NEUTROS PCT: 67 %
Platelets: 144 10*3/uL — ABNORMAL LOW (ref 145–400)
RBC: 4.54 MIL/uL (ref 3.70–5.45)
RDW: 14.5 % (ref 11.2–14.5)
WBC: 4.4 10*3/uL (ref 3.9–10.3)

## 2017-08-03 LAB — COMPREHENSIVE METABOLIC PANEL
ALK PHOS: 109 U/L (ref 40–150)
ALT: 13 U/L (ref 0–55)
ANION GAP: 9 (ref 3–11)
AST: 20 U/L (ref 5–34)
Albumin: 3.7 g/dL (ref 3.5–5.0)
BILIRUBIN TOTAL: 0.8 mg/dL (ref 0.2–1.2)
BUN: 17 mg/dL (ref 7–26)
CALCIUM: 9.5 mg/dL (ref 8.4–10.4)
CO2: 26 mmol/L (ref 22–29)
Chloride: 105 mmol/L (ref 98–109)
Creatinine, Ser: 1 mg/dL (ref 0.60–1.10)
GFR calc Af Amer: >60 mL/min (ref >60)
GFR calc non Af Amer: >60 mL/min (ref >60)
GLUCOSE: 137 mg/dL (ref 70–140)
Potassium: 4.3 mmol/L (ref 3.5–5.1)
SODIUM: 140 mmol/L (ref 136–145)
TOTAL PROTEIN: 7.6 g/dL (ref 6.4–8.3)

## 2017-08-03 MED ORDER — FAMOTIDINE IN NACL 20-0.9 MG/50ML-% IV SOLN
INTRAVENOUS | Status: AC
  Filled 2017-08-03: qty 50

## 2017-08-03 MED ORDER — TRASTUZUMAB CHEMO 150 MG IV SOLR
600.0000 mg | Freq: Once | INTRAVENOUS | Status: AC
  Administered 2017-08-03: 600 mg via INTRAVENOUS
  Filled 2017-08-03: qty 28.57

## 2017-08-03 MED ORDER — ACETAMINOPHEN 325 MG PO TABS
650.0000 mg | ORAL_TABLET | Freq: Once | ORAL | Status: AC
  Administered 2017-08-03: 650 mg via ORAL

## 2017-08-03 MED ORDER — SODIUM CHLORIDE 0.9 % IV SOLN
Freq: Once | INTRAVENOUS | Status: AC
  Administered 2017-08-03: 10:00:00 via INTRAVENOUS

## 2017-08-03 MED ORDER — DIPHENHYDRAMINE HCL 25 MG PO CAPS
50.0000 mg | ORAL_CAPSULE | Freq: Once | ORAL | Status: AC
  Administered 2017-08-03: 50 mg via ORAL

## 2017-08-03 MED ORDER — DIPHENHYDRAMINE HCL 25 MG PO CAPS
ORAL_CAPSULE | ORAL | Status: AC
  Filled 2017-08-03: qty 2

## 2017-08-03 MED ORDER — FAMOTIDINE IN NACL 20-0.9 MG/50ML-% IV SOLN
20.0000 mg | Freq: Once | INTRAVENOUS | Status: AC
  Administered 2017-08-03: 20 mg via INTRAVENOUS

## 2017-08-03 MED ORDER — ACETAMINOPHEN 325 MG PO TABS
ORAL_TABLET | ORAL | Status: AC
  Filled 2017-08-03: qty 2

## 2017-08-03 NOTE — Patient Instructions (Signed)
[facility] Discharge Instructions for Patients Receiving Chemotherapy Today you received the following chemotherapy agents:  Herceptin To help prevent nausea and vomiting after your treatment, we encourage you to take your nausea medication as prescribed.   If you develop nausea and vomiting that is not controlled by your nausea medication, call the clinic.   BELOW ARE SYMPTOMS THAT SHOULD BE REPORTED IMMEDIATELY:  *FEVER GREATER THAN 100.5 F  *CHILLS WITH OR WITHOUT FEVER  NAUSEA AND VOMITING THAT IS NOT CONTROLLED WITH YOUR NAUSEA MEDICATION  *UNUSUAL SHORTNESS OF BREATH  *UNUSUAL BRUISING OR BLEEDING  TENDERNESS IN MOUTH AND THROAT WITH OR WITHOUT PRESENCE OF ULCERS  *URINARY PROBLEMS  *BOWEL PROBLEMS  UNUSUAL RASH Items with * indicate a potential emergency and should be followed up as soon as possible.  Feel free to call the clinic should you have any questions or concerns. The clinic phone number is [phone].  Please show the CHEMO ALERT CARD at check-in to the Emergency Department and triage nurse.   

## 2017-08-24 ENCOUNTER — Other Ambulatory Visit: Payer: Self-pay

## 2017-08-24 ENCOUNTER — Inpatient Hospital Stay (HOSPITAL_BASED_OUTPATIENT_CLINIC_OR_DEPARTMENT_OTHER): Payer: BLUE CROSS/BLUE SHIELD | Admitting: Hematology and Oncology

## 2017-08-24 ENCOUNTER — Ambulatory Visit: Payer: Self-pay | Admitting: Hematology and Oncology

## 2017-08-24 ENCOUNTER — Other Ambulatory Visit: Payer: Self-pay | Admitting: General Surgery

## 2017-08-24 ENCOUNTER — Ambulatory Visit: Payer: Self-pay | Admitting: Internal Medicine

## 2017-08-24 ENCOUNTER — Telehealth: Payer: Self-pay | Admitting: Hematology and Oncology

## 2017-08-24 ENCOUNTER — Inpatient Hospital Stay: Payer: BLUE CROSS/BLUE SHIELD

## 2017-08-24 ENCOUNTER — Ambulatory Visit: Payer: Self-pay

## 2017-08-24 ENCOUNTER — Encounter: Payer: Self-pay | Admitting: *Deleted

## 2017-08-24 ENCOUNTER — Telehealth: Payer: Self-pay

## 2017-08-24 DIAGNOSIS — C50212 Malignant neoplasm of upper-inner quadrant of left female breast: Secondary | ICD-10-CM | POA: Diagnosis not present

## 2017-08-24 DIAGNOSIS — Z170 Estrogen receptor positive status [ER+]: Principal | ICD-10-CM

## 2017-08-24 DIAGNOSIS — Z5111 Encounter for antineoplastic chemotherapy: Secondary | ICD-10-CM | POA: Diagnosis not present

## 2017-08-24 DIAGNOSIS — Z79811 Long term (current) use of aromatase inhibitors: Secondary | ICD-10-CM | POA: Diagnosis not present

## 2017-08-24 LAB — COMPREHENSIVE METABOLIC PANEL
ALT: 18 U/L (ref 0–55)
ANION GAP: 13 — AB (ref 3–11)
AST: 22 U/L (ref 5–34)
Albumin: 4 g/dL (ref 3.5–5.0)
Alkaline Phosphatase: 119 U/L (ref 40–150)
BUN: 26 mg/dL (ref 7–26)
CO2: 22 mmol/L (ref 22–29)
Calcium: 9.6 mg/dL (ref 8.4–10.4)
Chloride: 104 mmol/L (ref 98–109)
Creatinine, Ser: 1.16 mg/dL — ABNORMAL HIGH (ref 0.60–1.10)
GFR calc non Af Amer: 54 mL/min — ABNORMAL LOW (ref >60)
Glucose, Bld: 104 mg/dL (ref 70–140)
POTASSIUM: 4 mmol/L (ref 3.5–5.1)
SODIUM: 139 mmol/L (ref 136–145)
Total Bilirubin: 1 mg/dL (ref 0.2–1.2)
Total Protein: 8.2 g/dL (ref 6.4–8.3)

## 2017-08-24 MED ORDER — FAMOTIDINE IN NACL 20-0.9 MG/50ML-% IV SOLN
INTRAVENOUS | Status: AC
  Filled 2017-08-24: qty 50

## 2017-08-24 MED ORDER — ACETAMINOPHEN 325 MG PO TABS
ORAL_TABLET | ORAL | Status: AC
  Filled 2017-08-24: qty 2

## 2017-08-24 MED ORDER — SODIUM CHLORIDE 0.9 % IV SOLN
Freq: Once | INTRAVENOUS | Status: AC
  Administered 2017-08-24: 15:00:00 via INTRAVENOUS

## 2017-08-24 MED ORDER — FAMOTIDINE IN NACL 20-0.9 MG/50ML-% IV SOLN
20.0000 mg | Freq: Once | INTRAVENOUS | Status: AC
  Administered 2017-08-24: 20 mg via INTRAVENOUS

## 2017-08-24 MED ORDER — SODIUM CHLORIDE 0.9% FLUSH
10.0000 mL | INTRAVENOUS | Status: AC | PRN
  Filled 2017-08-24: qty 10

## 2017-08-24 MED ORDER — TRASTUZUMAB CHEMO 150 MG IV SOLR
600.0000 mg | Freq: Once | INTRAVENOUS | Status: AC
  Administered 2017-08-24: 600 mg via INTRAVENOUS
  Filled 2017-08-24: qty 28.57

## 2017-08-24 MED ORDER — DIPHENHYDRAMINE HCL 25 MG PO CAPS
ORAL_CAPSULE | ORAL | Status: AC
  Filled 2017-08-24: qty 2

## 2017-08-24 MED ORDER — ACETAMINOPHEN 325 MG PO TABS
650.0000 mg | ORAL_TABLET | Freq: Once | ORAL | Status: AC
  Administered 2017-08-24: 650 mg via ORAL

## 2017-08-24 MED ORDER — HEPARIN SOD (PORK) LOCK FLUSH 100 UNIT/ML IV SOLN
500.0000 [IU] | Freq: Once | INTRAVENOUS | Status: AC | PRN
  Filled 2017-08-24: qty 5

## 2017-08-24 MED ORDER — DIPHENHYDRAMINE HCL 25 MG PO CAPS
50.0000 mg | ORAL_CAPSULE | Freq: Once | ORAL | Status: AC
  Administered 2017-08-24: 50 mg via ORAL

## 2017-08-24 NOTE — Telephone Encounter (Signed)
Gave patient AVs and calendar of upcoming November appointments.

## 2017-08-24 NOTE — Telephone Encounter (Signed)
Per 5/28 patient called to verify appoiuntment.

## 2017-08-24 NOTE — Progress Notes (Signed)
Patient Care Team: McLean-Scocuzza, Nino Glow, MD as PCP - General (Internal Medicine) Vonna Drafts, FNP as Nurse Practitioner (Nurse Practitioner) Stark Klein, MD as Consulting Physician (General Surgery) Nicholas Lose, MD as Consulting Physician (Hematology and Oncology) Gery Pray, MD as Consulting Physician (Radiation Oncology)  DIAGNOSIS:  Encounter Diagnosis  Name Primary?  . Malignant neoplasm of upper-inner quadrant of left breast in female, estrogen receptor positive ([place])     SUMMARY OF ONCOLOGIC HISTORY:   Malignant neoplasm of upper-inner quadrant of left breast in female, estrogen receptor positive ([place])   03/04/2016 Initial Diagnosis    Screening detected left breast density posterior medial 1.4 cm by ultrasound axilla negative, grade 3 IDC ER 60%, PR 0%, Ki-67 30%, HER-2 positive ratio 6.04, copy #16; T1 cN0 stage IA clinical stage      03/21/2016 Genetic Testing    NEgative genetic testing on the comprehensive cancer panel and Negative genetic testing for the MSH2 inversion analysis ([place] inversion). The Comprehensive Cancer Panel offered by [facility] includes sequencing and/or deletion duplication testing of the following 32 genes: APC, ATM, AXIN2, BARD1, BMPR1A, BRCA1, BRCA2, BRIP1, CDH1, CDK4, CDKN2A, CHEK2, EPCAM, FANCC, MLH1, MSH2, MSH6, MUTYH, NBN, PALB2, PMS2, POLD1, POLE, PTEN, RAD51C, RAD51D, SCG5/GREM1, SMAD4, STK11, TP53, VHL, and XRCC2.   The report date is March 21, 2016.      07/27/2016 Surgery    Left lumpectomy: IDC grade 2, 2.6 cm, DCIS intermediate grade,0/3 lymph nodes negative, ER 60%, PR 0%, Ki-67 30%, HER-2 positive ratio 6.04, T2 N0 stage II a      09/01/2016 - 11/03/2016 Chemotherapy    Abraxane Herceptin weekly 12 followed by Herceptin maintenance every 3 weeks       11/19/2016 - 01/20/2017 Radiation Therapy    Adjuvant radiation therapy      01/29/2017 -  Anti-estrogen oral therapy    Anastrozole 2.5 mg daily       CHIEF  COMPLIANT: Last dosage of Herceptin, anastrozole therapy  INTERVAL HISTORY: [name] is a 52 year old with above-mentioned history of breast cancer treated with lumpectomy followed by adjuvant chemotherapy and radiation is currently on anastrozole therapy.  She is also on Herceptin and today's last dosage of Herceptin.  She reports no problems or concerns with either anastrozole or Herceptin.  She had a mammogram at [facility] in April which was normal.  REVIEW OF SYSTEMS:   Constitutional: Denies fevers, chills or abnormal weight loss Eyes: Denies blurriness of vision Ears, nose, mouth, throat, and face: Denies mucositis or sore throat Respiratory: Denies cough, dyspnea or wheezes Cardiovascular: Denies palpitation, chest discomfort Gastrointestinal:  Denies nausea, heartburn or change in bowel habits Skin: Denies abnormal skin rashes Lymphatics: Denies new lymphadenopathy or easy bruising Neurological:Denies numbness, tingling or new weaknesses Behavioral/Psych: Mood is stable, no new changes  Extremities: No lower extremity edema Breast:  denies any pain or lumps or nodules in either breasts All other systems were reviewed with the patient and are negative.  I have reviewed the past medical history, past surgical history, social history and family history with the patient and they are unchanged from previous note.  ALLERGIES:  is allergic to ampicillin; amoxicillin; other; and sulfa antibiotics.  MEDICATIONS:  Current Outpatient Medications  Medication Sig Dispense Refill  . acetaminophen (TYLENOL) 500 MG tablet Take 1,000 mg by mouth every 6 (six) hours as needed for moderate pain or headache.     . albuterol (PROVENTIL HFA;VENTOLIN HFA) 108 (90 Base) MCG/ACT inhaler Inhale 2 puffs into the lungs  every 6 (six) hours as needed for wheezing or shortness of breath. 1 Inhaler 2  . allopurinol (ZYLOPRIM) 100 MG tablet Take 1 tablet (100 mg total) by mouth daily. 90 tablet 1  .  anastrozole (ARIMIDEX) 1 MG tablet Take 1 mg by mouth daily.  1  . Azelastine-Fluticasone (DYMISTA) 137-50 MCG/ACT SUSP Place 1 spray into the nose 2 (two) times daily. 1 Bottle 11  . cetirizine (ZYRTEC) 10 MG tablet Take 10 mg by mouth daily.    . Cholecalciferol 50000 units capsule Take 1 capsule (50,000 Units total) by mouth once a week. 13 capsule 1  . colchicine 0.6 MG tablet Daily 90 tablet 0  . fluticasone (FLONASE) 50 MCG/ACT nasal spray Place into both nostrils daily.    . Fluticasone-Salmeterol (ADVAIR DISKUS) 250-50 MCG/DOSE AEPB Inhale 1 puff into the lungs 2 (two) times daily. Rinse mouth 60 each 5  . ibuprofen (ADVIL,MOTRIN) 800 MG tablet Take 800 mg by mouth every 8 (eight) hours as needed for moderate pain.    Marland Kitchen losartan (COZAAR) 100 MG tablet Take 1 tablet (100 mg total) by mouth daily. (Patient taking differently: Take 50 mg by mouth daily. ) 30 tablet 0  . metFORMIN (GLUCOPHAGE) 500 MG tablet Take 1 tablet (500 mg total) by mouth 2 (two) times daily with a meal. 60 tablet 2  . metoprolol tartrate (LOPRESSOR) 25 MG tablet Take 1 tablet (25 mg total) by mouth 2 (two) times daily. 180 tablet 1  . OPSUMIT 10 MG TABS Take 10 mg by mouth daily.  8  . Polyethyl Glycol-Propyl Glycol (SYSTANE OP) Apply 1 drop to eye daily as needed (dry eyes).    . predniSONE (DELTASONE) 10 MG tablet Take 4 tablets (40 mg total) by mouth daily. 20 tablet 0  . predniSONE (DELTASONE) 20 MG tablet 40 mg ( 2pills) x 5 days, then 20 mg (1 pill x 4 days), then 1/2 pill x 1 day then stop. Take in am with food 15 tablet 0  . sildenafil (REVATIO) 20 MG tablet Take 20 mg by mouth 3 (three) times daily.    Marland Kitchen spironolactone (ALDACTONE) 50 MG tablet Take 1 tablet (50 mg total) by mouth daily. 90 tablet 1  . torsemide (DEMADEX) 20 MG tablet Take 20 mg by mouth 2 (two) times daily.    Marland Kitchen UPTRAVI 400 MCG TABS Take 400 mcg by mouth 2 (two) times daily.   10   No current facility-administered medications for this visit.       PHYSICAL EXAMINATION: ECOG PERFORMANCE STATUS: 1 - Symptomatic but completely ambulatory  Vitals:   08/24/17 1347  BP: 128/64  Pulse: 95  Resp: 20  Temp: 98.5 F (36.9 C)  SpO2: 92%   Filed Weights   08/24/17 1347  Weight: 232 lb 4.8 oz (105.4 kg)    GENERAL:alert, no distress and comfortable SKIN: skin color, texture, turgor are normal, no rashes or significant lesions EYES: normal, Conjunctiva are pink and non-injected, sclera clear OROPHARYNX:no exudate, no erythema and lips, buccal mucosa, and tongue normal  NECK: supple, thyroid normal size, non-tender, without nodularity LYMPH:  no palpable lymphadenopathy in the cervical, axillary or inguinal LUNGS: clear to auscultation and percussion with normal breathing effort HEART: regular rate & rhythm and no murmurs and no lower extremity edema ABDOMEN:abdomen soft, non-tender and normal bowel sounds MUSCULOSKELETAL:no cyanosis of digits and no clubbing  NEURO: alert & oriented x 3 with fluent speech, no focal motor/sensory deficits EXTREMITIES: No lower extremity edema  LABORATORY  DATA:  I have reviewed the data as listed CMP Latest Ref Rng & Units 08/24/2017 08/03/2017 07/11/2017  Glucose 70 - 140 mg/dL 104 137 111(H)  BUN 7 - 26 mg/dL _0 Creatinine 0.60 - 1.10 mg/dL 1.16(H) 1.00 0.90  Sodium 136 - 145 mmol/L 139 140 142  Potassium 3.5 - 5.1 mmol/L 4.0 4.3 4.4  Chloride 98 - 109 mmol/L 104 105 107  CO2 22 - 29 mmol/L _1 Calcium 8.4 - 10.4 mg/dL 9.6 9.5 9.3  Total Protein 6.4 - 8.3 g/dL 8.2 7.6 -  Total Bilirubin 0.2 - 1.2 mg/dL 1.0 0.8 -  Alkaline Phos 40 - 150 U/L 119 109 -  AST 5 - 34 U/L 22 20 -  ALT 0 - 55 U/L 18 13 -    Lab Results  Component Value Date   WBC 4.4 08/03/2017   HGB 14.9 08/03/2017   HCT 45.4 08/03/2017   MCV 100.0 08/03/2017   PLT 144 (L) 08/03/2017   NEUTROABS 2.9 08/03/2017    ASSESSMENT & PLAN:  Malignant neoplasm of upper-inner quadrant of left breast in female,  estrogen receptor positive ([place]) Left lumpectomy 07/28/2016: IDC grade 2, 2.6 cm, DCIS intermediate grade,0/3 lymph nodes negative, ER 60%, PR 0%, Ki-67 30%, HER-2 positive ratio 6.04, T2 N0 stage II a  Treatment summary: 1. Adjuvant chemotherapy with Abraxane and Herceptin (cannot take Taxol because she could not receive steroids)weekly 12 followed by every 3 week Herceptin for one yearuntil May 2019 2. followed by adjuvant radiation08/29/2018-01/05/2017 3. Followed by adjuvant antiestrogen therapystarted 01/28/2017 ------------------------------------------------------------------------------------------------------------------------------- Treatment plan: Adjuvant antiestrogen therapy withanastrozole 1 mg daily 01/28/2017 Adjuvant Herceptin to be completed May 2019  Anastrozole toxicities: Denies any hot flashes arthralgias or myalgias.  Surveillance: 1.  Breast exam 06/22/2017: Benign 2. mammograms   April 2019 at [facility]: Benign  Today's last dosage of Herceptin. Sent a message to Dr. Barry Dienes to get her port removed.  Return to clinic in 6 months for follow-up  No orders of the defined types were placed in this encounter.  The patient has a good understanding of the overall plan. she agrees with it. she will call with any problems that may develop before the next visit here.   Harriette Ohara, MD 08/24/17

## 2017-08-24 NOTE — Assessment & Plan Note (Signed)
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 Adjuvant chemotherapy with Abraxane and Herceptin (cannot take Taxol because she could not receive steroids)weekly 12 followed by every 3 week Herceptin for one yearuntil May 2019 2. followed by adjuvant radiation08/29/2018-01/05/2017 3. Followed by adjuvant antiestrogen therapystarted 01/28/2017 ------------------------------------------------------------------------------------------------------------------------------- Treatment plan: Adjuvant antiestrogen therapy withanastrozole 1 mg daily 01/28/2017 Adjuvant Herceptin to be completed May 2019  Anastrozole toxicities: Denies any hot flashes arthralgias or myalgias.  Surveillance: 1.  Breast exam 06/22/2017: Benign 2. mammograms to be done in April  Today's last dosage of Herceptin. Sent a message to get her port removed.  Return to clinic in 6 months for follow-up

## 2017-08-24 NOTE — Patient Instructions (Signed)
[facility] Discharge Instructions for Patients Receiving Chemotherapy  Today you received the following chemotherapy agents Herceptin  To help prevent nausea and vomiting after your treatment, we encourage you to take your nausea medication as directed   If you develop nausea and vomiting that is not controlled by your nausea medication, call the clinic.   BELOW ARE SYMPTOMS THAT SHOULD BE REPORTED IMMEDIATELY:  *FEVER GREATER THAN 100.5 F  *CHILLS WITH OR WITHOUT FEVER  NAUSEA AND VOMITING THAT IS NOT CONTROLLED WITH YOUR NAUSEA MEDICATION  *UNUSUAL SHORTNESS OF BREATH  *UNUSUAL BRUISING OR BLEEDING  TENDERNESS IN MOUTH AND THROAT WITH OR WITHOUT PRESENCE OF ULCERS  *URINARY PROBLEMS  *BOWEL PROBLEMS  UNUSUAL RASH Items with * indicate a potential emergency and should be followed up as soon as possible.  Feel free to call the clinic should you have any questions or concerns. The clinic phone number is [phone].  Please show the [place] at check-in to the Emergency Department and triage nurse.

## 2017-08-26 DIAGNOSIS — G4733 Obstructive sleep apnea (adult) (pediatric): Secondary | ICD-10-CM | POA: Diagnosis not present

## 2017-08-27 ENCOUNTER — Other Ambulatory Visit: Payer: Self-pay

## 2017-08-27 ENCOUNTER — Other Ambulatory Visit: Payer: Self-pay | Admitting: Gastroenterology

## 2017-08-27 ENCOUNTER — Encounter (HOSPITAL_COMMUNITY): Payer: Self-pay | Admitting: Emergency Medicine

## 2017-08-30 ENCOUNTER — Encounter: Payer: Self-pay | Admitting: Internal Medicine

## 2017-08-30 ENCOUNTER — Ambulatory Visit: Payer: BLUE CROSS/BLUE SHIELD | Admitting: Internal Medicine

## 2017-08-30 VITALS — BP 132/80 | HR 90 | Temp 97.6°F | Ht 65.0 in | Wt 234.4 lb

## 2017-08-30 DIAGNOSIS — K824 Cholesterolosis of gallbladder: Secondary | ICD-10-CM | POA: Diagnosis not present

## 2017-08-30 DIAGNOSIS — Z0184 Encounter for antibody response examination: Secondary | ICD-10-CM | POA: Diagnosis not present

## 2017-08-30 DIAGNOSIS — C50212 Malignant neoplasm of upper-inner quadrant of left female breast: Secondary | ICD-10-CM

## 2017-08-30 DIAGNOSIS — K760 Fatty (change of) liver, not elsewhere classified: Secondary | ICD-10-CM | POA: Diagnosis not present

## 2017-08-30 DIAGNOSIS — E119 Type 2 diabetes mellitus without complications: Secondary | ICD-10-CM | POA: Diagnosis not present

## 2017-08-30 DIAGNOSIS — Z1159 Encounter for screening for other viral diseases: Secondary | ICD-10-CM

## 2017-08-30 DIAGNOSIS — M109 Gout, unspecified: Secondary | ICD-10-CM | POA: Diagnosis not present

## 2017-08-30 DIAGNOSIS — I272 Pulmonary hypertension, unspecified: Secondary | ICD-10-CM | POA: Diagnosis not present

## 2017-08-30 DIAGNOSIS — Z170 Estrogen receptor positive status [ER+]: Secondary | ICD-10-CM

## 2017-08-30 DIAGNOSIS — I10 Essential (primary) hypertension: Secondary | ICD-10-CM | POA: Diagnosis not present

## 2017-08-30 DIAGNOSIS — D649 Anemia, unspecified: Secondary | ICD-10-CM

## 2017-08-30 MED ORDER — TELMISARTAN 40 MG PO TABS: 40.0000 mg | ORAL_TABLET | Freq: Every day | ORAL | 3 refills | Status: DC

## 2017-08-30 NOTE — Patient Instructions (Signed)
Stop Losartan and start telmisartan  Use debrox ear drops to left ear  Labs today  Fatty Liver Fatty liver, also called hepatic steatosis or steatohepatitis, is a condition in which too much fat has built up in your liver cells. The liver removes harmful substances from your bloodstream. It produces fluids your body needs. It also helps your body use and store energy from the food you eat. In many cases, fatty liver does not cause symptoms or problems. It is often diagnosed when tests are being done for other reasons. However, over time, fatty liver can cause inflammation that may lead to more serious liver problems, such as scarring of the liver (cirrhosis). What are the causes? Causes of fatty liver may include:  Drinking too much alcohol.  Poor nutrition.  Obesity.  Cushing syndrome.  Diabetes.  Hyperlipidemia.  Pregnancy.  Certain drugs.  Poisons.  Some viral infections.  What increases the risk? You may be more likely to develop fatty liver if you:  Abuse alcohol.  Are pregnant.  Are overweight.  Have diabetes.  Have hepatitis.  Have a high triglyceride level.  What are the signs or symptoms? Fatty liver often does not cause any symptoms. In cases where symptoms develop, they can include:  Fatigue.  Weakness.  Weight loss.  Confusion.  Abdominal pain.  Yellowing of your skin and the white parts of your eyes (jaundice).  Nausea and vomiting.  How is this diagnosed? Fatty liver may be diagnosed by:  Physical exam and medical history.  Blood tests.  Imaging tests, such as an ultrasound, CT scan, or MRI.  Liver biopsy. A small sample of liver tissue is removed using a needle. The sample is then looked at under a microscope.  How is this treated? Fatty liver is often caused by other health conditions. Treatment for fatty liver may involve medicines and lifestyle changes to manage conditions such as:  Alcoholism.  High  cholesterol.  Diabetes.  Being overweight or obese.  Follow these instructions at home:  Eat a healthy diet as directed by your health care provider.  Exercise regularly. This can help you lose weight and control your cholesterol and diabetes. Talk to your health care provider about an exercise plan and which activities are best for you.  Do not drink alcohol.  Take medicines only as directed by your health care provider. Contact a health care provider if: You have difficulty controlling your:  Blood sugar.  Cholesterol.  Alcohol consumption.  Get help right away if:  You have abdominal pain.  You have jaundice.  You have nausea and vomiting. This information is not intended to replace advice given to you by your health care provider. Make sure you discuss any questions you have with your health care provider. Document Released: 05/01/2005 Document Revised: 08/22/2015 Document Reviewed: 07/26/2013 Elsevier Interactive Patient Education  [employer].

## 2017-08-30 NOTE — Progress Notes (Signed)
Chief Complaint  Patient presents with  . Follow-up   F/u/annual physical  1. Gout still c/o toe pain left will check uric acid today  2. Completed last tx for breast cancer last Tuesday doing well will check CBC not checked with last labs also h/o iron def anemia  3. Reviewed [place] ab 4 mm GB polyp fatty liver will disc with GI as has appt this Thurs [facility] colonoscopy and EGD if rec HID  4. HTN controlled on meds  5. Pulmonary HTN sees [facility] Dr. Harrietta Guardian who rec revatio increase to 40 tid of note pt taking torsemide 20 mg qod instead of bid will inform Dr. Harrietta Guardian her Creatinine did increase slightly recently  6. DM 2 A1C 06/22/17 6.3 on metformin 500 mg bid   Review of Systems  Constitutional: Negative for weight loss.  HENT: Negative for hearing loss.   Eyes: Negative for blurred vision.  Respiratory: Negative for shortness of breath.   Cardiovascular: Negative for chest pain.  Gastrointestinal: Negative for abdominal pain.  Musculoskeletal: Negative for falls.  Skin: Negative for rash.  Neurological: Negative for headaches.  Psychiatric/Behavioral: Negative for depression.   Past Medical History:  Diagnosis Date  . Anemia    iron deficiency  . Arthritis   . Borderline diabetes   . Complication of anesthesia    woke up slowly- after hysterectomy- 2015  . Diabetes mellitus, type II ([place])   . DVT (deep venous thrombosis) ([place]) 2014   left leg  . Family history of breast cancer   . Gout   . HTN (hypertension)   . 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 Menorrhagia    secondary to uterine fibroids  . OSA (obstructive sleep apnea)    07/25/13 HST AHI 33/hr, severe hypoxemia O2 min 42% and 95% of the time <89%  . PE (pulmonary embolism) 2014   bilateral  . Pulmonary artery hypertension ([place])   . Pulmonary nodule    (67m on loeft lower lobe) found on CT scan July 2014, repeat scan Jan 2015 showed less than 456m . Right ovarian cyst     noted 09/2012   . S/P TAH (total abdominal hysterectomy) 06/07/2013  . Trichomoniasis    05/2011    Past Surgical History:  Procedure Laterality Date  . ABDOMINAL HYSTERECTOMY N/A 06/07/2013   Procedure: HYSTERECTOMY ABDOMINAL WITH BIALTERAL SALPINGECTOMY;  Surgeon: VaElveria RoyalsMD;  [facility];  Service: Gynecology;  Laterality: N/A;  . BREAST LUMPECTOMY Left 07/27/2016   BREAST LUMPECTOMY WITH RADIOACTIVE SEED AND SENTINEL LYMPH NODE BIOPSY (Left)  . BREAST LUMPECTOMY WITH RADIOACTIVE SEED AND SENTINEL LYMPH NODE BIOPSY Left 07/27/2016   Procedure: BREAST LUMPECTOMY WITH RADIOACTIVE SEED AND SENTINEL LYMPH NODE BIOPSY;  Surgeon: FaStark KleinMD;  [facility] Service: General;  Laterality: Left;  . CARDIAC CATHETERIZATION N/A 12/24/2015   Procedure: Right Heart Cath;  Surgeon: JaAdrian ProwsMD;  [facility];  Service: Cardiovascular;  Laterality: N/A;  . CESAREAN SECTION    . IR CV LINE INJECTION  07/27/2017  . POLYPECTOMY  2008   Removal of uterine polyp  . PORTA CATH INSERTION  07/27/2016  . PORTACATH PLACEMENT Right 07/27/2016   Procedure: INSERTION PORT-A-CATH;  Surgeon: FaStark KleinMD;  Location: [facility];  Service: General;  Laterality: Right;   Family History  Problem Relation Age of Onset  . Hypertension Mother   . Kidney disease Mother   .  Hypertension Father   . Stroke Sister   . Breast cancer Maternal Grandmother        died at 59  . Breast cancer Paternal Grandmother   . Breast cancer Cousin        pat first cousin dx in her 34s  . Hypertension Brother   . Cancer Maternal Aunt        unknown form  . Breast cancer Paternal Aunt   . Colon cancer Other 21       MGMs brother  . Breast cancer Paternal Aunt   . Cancer Other        breast ca in [place]  . Cancer Other        g uncle colon or stomach ca   Social History   Socioeconomic History  . Marital status: Single    Spouse name: Not on file  . Number of children: 1  . Years of education:  Not on file  . Highest education level: Not on file  Occupational History  . Not on file  Social Needs  . Financial resource strain: Not on file  . Food insecurity:    Worry: Not on file    Inability: Not on file  . Transportation needs:    Medical: Not on file    Non-medical: Not on file  Tobacco Use  . Smoking status: Former Smoker    Packs/day: 0.33    Years: 10.00    Pack years: 3.30    Types: Cigarettes    Last attempt to quit: 10/04/2011    Years since quitting: 5.9  . Smokeless tobacco: Never Used  Substance and Sexual Activity  . Alcohol use: Yes    Alcohol/week: 0.0 oz    Comment: social  . Drug use: No  . Sexual activity: Yes  Lifestyle  . Physical activity:    Days per week: Not on file    Minutes per session: Not on file  . Stress: Not on file  Relationships  . Social connections:    Talks on phone: Not on file    Gets together: Not on file    Attends religious service: Not on file    Active member of club or organization: Not on file    Attends meetings of clubs or organizations: Not on file    Relationship status: Not on file  . Intimate partner violence:    Fear of current or ex partner: Not on file    Emotionally abused: Not on file    Physically abused: Not on file    Forced sexual activity: Not on file  Other Topics Concern  . 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 acetaminophen (TYLENOL) 500 MG tablet Take 1,000 mg by mouth every 6 (six) hours as needed for moderate pain or headache.   . albuterol (PROVENTIL HFA;VENTOLIN HFA) 108 (90 Base) MCG/ACT inhaler Inhale 2 puffs into the lungs every 6 (six) hours as needed for wheezing or shortness of breath.  . allopurinol (ZYLOPRIM) 100 MG tablet Take 1 tablet (100 mg total) by mouth daily.  Marland Kitchen anastrozole  (ARIMIDEX) 1 MG tablet Take 1 mg by mouth daily.  . Azelastine-Fluticasone (DYMISTA) 137-50 MCG/ACT SUSP Place 1 spray into the nose 2 (two)  times daily.  . cetirizine (ZYRTEC) 10 MG tablet Take 10 mg by mouth daily.  . Cholecalciferol 50000 units capsule Take 1 capsule (50,000 Units total) by mouth once a week.  . colchicine 0.6 MG tablet Daily  . fluticasone (FLONASE) 50 MCG/ACT nasal spray Place 2 sprays into both nostrils daily.   . Fluticasone-Salmeterol (ADVAIR DISKUS) 250-50 MCG/DOSE AEPB Inhale 1 puff into the lungs 2 (two) times daily. Rinse mouth  . losartan (COZAAR) 100 MG tablet Take 1 tablet (100 mg total) by mouth daily. (Patient taking differently: Take 50 mg by mouth daily. )  . metFORMIN (GLUCOPHAGE) 500 MG tablet Take 1 tablet (500 mg total) by mouth 2 (two) times daily with a meal.  . metoprolol tartrate (LOPRESSOR) 25 MG tablet Take 1 tablet (25 mg total) by mouth 2 (two) times daily. (Patient taking differently: Take 25 mg by mouth daily. )  . Multiple Vitamin (MULTIVITAMIN) tablet Take 1 tablet by mouth daily.  . OPSUMIT 10 MG TABS Take 10 mg by mouth daily.  Vladimir Faster Glycol-Propyl Glycol (SYSTANE OP) Place 1 drop into both eyes daily as needed (dry eyes).   . predniSONE (DELTASONE) 10 MG tablet Take 4 tablets (40 mg total) by mouth daily.  . predniSONE (DELTASONE) 20 MG tablet 40 mg ( 2pills) x 5 days, then 20 mg (1 pill x 4 days), then 1/2 pill x 1 day then stop. Take in am with food  . sildenafil (REVATIO) 20 MG tablet Take 20 mg by mouth 3 (three) times daily.  Marland Kitchen spironolactone (ALDACTONE) 50 MG tablet Take 1 tablet (50 mg total) by mouth daily.  Marland Kitchen torsemide (DEMADEX) 20 MG tablet Take 20 mg by mouth 2 (two) times daily.  Marland Kitchen triamcinolone cream (KENALOG) 0.1 % Apply 1 application topically daily as needed (leg discoloration).  Marland Kitchen UPTRAVI 400 MCG TABS Take 400 mcg by mouth daily.    Allergies  Allergen Reactions  . Ampicillin Other (See Comments)    Severe abdominal  pain, dizziness (penicillin is okay)  . Amoxicillin Other (See Comments)    LIGHT-HEADED and CLOSE TO "PASSING OUT"  AND ABDOMINAL PAIN Has patient had a PCN reaction causing immediate rash, facial/tongue/throat swelling, SOB or lightheadedness with hypotension: No Has patient had a PCN reaction causing severe rash involving mucus membranes or skin necrosis: No Has patient had a PCN reaction that required hospitalization No Has patient had a PCN reaction occurring within the last 10 years: No If all of the above answers are "NO", then may proceed with Cephalosporin use.  . Sulfa Antibiotics Rash and Other (See Comments)    Very bad yeast infection   Recent Results (from the past 2160 hour(s))  Urinalysis, Routine w reflex microscopic     Status: Abnormal   Collection Time: 06/21/17 11:25 AM  Result Value Ref Range   Color, Urine YELLOW Yellow;Lt. Yellow   APPearance Sl Cloudy (A) Clear   Specific Gravity, Urine 1.020 1.000 - 1.030   pH 5.5 5.0 - 8.0   Total Protein, Urine TRACE (A) Negative   Urine Glucose NEGATIVE Negative   Ketones, ur NEGATIVE Negative   Bilirubin Urine NEGATIVE Negative   Hgb urine dipstick NEGATIVE Negative   Urobilinogen, UA 0.2 0.0 - 1.0   Leukocytes, UA MODERATE (A) Negative   Nitrite NEGATIVE Negative   WBC, UA 11-20/hpf (A) 0-2/hpf   RBC / HPF none seen 0-2/hpf   Squamous Epithelial / LPF Rare(0-4/hpf) Rare(0-4/hpf)   Hyaline Casts, UA Presence of (A) None  Urine Microalbumin w/creat. ratio     Status: Abnormal   Collection Time: 06/21/17 11:25 AM  Result Value Ref Range   Microalb, Ur 20.1 (H) 0.0 - 1.9 mg/dL   Creatinine,U 166.6 mg/dL   Microalb Creat Ratio 12.1 0.0 - 30.0 mg/g  Lipid Panel w/o Chol/HDL Ratio     Status: Abnormal   Collection Time: 06/22/17 10:53 AM  Result Value Ref Range   Cholesterol, Total 148 100 - 199 mg/dL   Triglycerides 128 0 - 149 mg/dL   HDL 36 (L) >39 mg/dL   VLDL Cholesterol Cal 26 5 - 40 mg/dL   LDL Calculated 86  0 - 99 mg/dL  Hgb A1c w/o eAG     Status: Abnormal   Collection Time: 06/22/17 10:53 AM  Result Value Ref Range   Hgb A1c MFr Bld 6.3 (H) 4.8 - 5.6 %    Comment:          Prediabetes: 5.7 - 6.4          Diabetes: >6.4          Glycemic control for adults with diabetes: <7.0   VITAMIN D 25 Hydroxy (Vit-D Deficiency, Fractures)     Status: Abnormal   Collection Time: 06/22/17 10:53 AM  Result Value Ref Range   Vit D, 25-Hydroxy 18.3 (L) 30.0 - 100.0 ng/mL    Comment: Vitamin D deficiency has been defined by the [place] and an Endocrine Society practice guideline as a level of serum 25-OH vitamin D less than 20 ng/mL (1,2). The Endocrine Society went on to further define vitamin D insufficiency as a level between 21 and 29 ng/mL (2). 1. IOM (Institute of Medicine). 2010. Dietary reference    intakes for calcium and D. [place]: The    [employer]. 2. Holick MF, Binkley [place], Bischoff-Ferrari HA, et al.    Evaluation, treatment, and prevention of vitamin D    deficiency: an Endocrine Society clinical practice    guideline. JCEM. 2011 Jul; 96(7):1911-30.   TSH     Status: None   Collection Time: 06/22/17 10:53 AM  Result Value Ref Range   TSH 1.350 0.450 - 4.500 uIU/mL  Pro b natriuretic peptide (BNP)     Status: Abnormal   Collection Time: 06/22/17 10:53 AM  Result Value Ref Range   NT-Pro BNP 1,430 (H) 0 - 249 pg/mL    Comment: The following cut-points have been suggested for the use of proBNP for the diagnostic evaluation of heart failure (HF) in patients with acute dyspnea: Modality                     Age           Optimal Cut                            (years)            Point ------------------------------------------------------ Diagnosis (rule in HF)        <50            450 pg/mL                           50 - 75            900 pg/mL                               >  75           1800 pg/mL Exclusion (rule out HF)  Age independent     300  pg/mL   T4     Status: None   Collection Time: 06/22/17 10:53 AM  Result Value Ref Range   T4, Total 6.8 4.5 - 12.0 ug/dL  Specimen status report     Status: None   Collection Time: 06/22/17 10:53 AM  Result Value Ref Range   specimen status report Comment     Comment: Written Authorization Written Authorization Written Authorization Received. Authorization received from Original Requisition 06-23-2017 Logged by Pennie Rushing   CBC with Differential     Status: Abnormal   Collection Time: 06/22/17 11:45 AM  Result Value Ref Range   WBC 5.2 3.9 - 10.3 K/uL   RBC 3.98 3.70 - 5.45 MIL/uL   Hemoglobin 13.0 11.6 - 15.9 g/dL   HCT 38.6 34.8 - 46.6 %   MCV 96.9 79.5 - 101.0 fL   MCH 32.5 25.1 - 34.0 pg   MCHC 33.6 31.5 - 36.0 g/dL   RDW 15.0 (H) 11.2 - 14.5 %   Platelets 203 145 - 400 K/uL   Neutrophils Relative % 69 %   Neutro Abs 3.6 1.5 - 6.5 K/uL   Lymphocytes Relative 18 %   Lymphs Abs 0.9 0.9 - 3.3 K/uL   Monocytes Relative 9 %   Monocytes Absolute 0.5 0.1 - 0.9 K/uL   Eosinophils Relative 3 %   Eosinophils Absolute 0.1 0.0 - 0.5 K/uL   Basophils Relative 1 %   Basophils Absolute 0.0 0.0 - 0.1 K/uL    Comment: Performed at [facility], [address], [place], [place] [address]  Comprehensive metabolic panel     Status: Abnormal   Collection Time: 06/22/17 11:45 AM  Result Value Ref Range   Sodium 143 136 - 145 mmol/L   Potassium 4.0 3.5 - 5.1 mmol/L   Chloride 106 98 - 109 mmol/L   CO2 27 22 - 29 mmol/L   Glucose, Bld 93 70 - 140 mg/dL   BUN 21 7 - 26 mg/dL   Creatinine, Ser 1.43 (H) 0.60 - 1.10 mg/dL   Calcium 9.4 8.4 - 10.4 mg/dL   Total Protein 7.6 6.4 - 8.3 g/dL   Albumin 3.6 3.5 - 5.0 g/dL   AST 16 5 - 34 U/L   ALT 14 0 - 55 U/L   Alkaline Phosphatase 102 40 - 150 U/L   Total Bilirubin 0.9 0.2 - 1.2 mg/dL   GFR calc non Af Amer 42 (L) >60 mL/min   GFR calc Af Amer 48 (L) >60 mL/min    Comment: (NOTE) The eGFR has been calculated  using the CKD EPI equation. This calculation has not been validated in all clinical situations. eGFR's persistently <60 mL/min signify possible Chronic Kidney Disease.    Anion gap 10 3 - 11    Comment: Performed at [facility], [address], [place], [place] [address]  CBC     Status: None   Collection Time: 07/11/17  8:50 AM  Result Value Ref Range   WBC 4.4 4.0 - 10.5 K/uL   RBC 4.04 3.87 - 5.11 MIL/uL   Hemoglobin 13.3 12.0 - 15.0 g/dL   HCT 40.1 36.0 - 46.0 %   MCV 99.3 78.0 - 100.0 fL   MCH 32.9 26.0 - 34.0 pg   MCHC 33.2 30.0 - 36.0 g/dL   RDW  14.3 11.5 - 15.5 %   Platelets 175 150 - 400 K/uL    Comment: Performed at [facility], [place] [address], [place], [place] [address]  Basic metabolic panel     Status: Abnormal   Collection Time: 07/11/17  8:50 AM  Result Value Ref Range   Sodium 142 135 - 145 mmol/L   Potassium 4.4 3.5 - 5.1 mmol/L   Chloride 107 101 - 111 mmol/L   CO2 26 22 - 32 mmol/L   Glucose, Bld 111 (H) 65 - 99 mg/dL   BUN 14 6 - 20 mg/dL   Creatinine, Ser 0.90 0.44 - 1.00 mg/dL   Calcium 9.3 8.9 - 10.3 mg/dL   GFR calc non Af Amer >60 >60 mL/min   GFR calc Af Amer >60 >60 mL/min    Comment: (NOTE) The eGFR has been calculated using the CKD EPI equation. This calculation has not been validated in all clinical situations. eGFR's persistently <60 mL/min signify possible Chronic Kidney Disease.    Anion gap 9 5 - 15    Comment: Performed at [facility], [place] [address], [place], [place] [address]  CBC with Differential     Status: Abnormal   Collection Time: 08/03/17  9:37 AM  Result Value Ref Range   WBC 4.4 3.9 - 10.3 K/uL   RBC 4.54 3.70 - 5.45 MIL/uL   Hemoglobin 14.9 11.6 - 15.9 g/dL   HCT 45.4 34.8 - 46.6 %   MCV 100.0 79.5 - 101.0 fL   MCH 32.8 25.1 - 34.0 pg   MCHC 32.8 31.5 - 36.0 g/dL   RDW 14.5 11.2 - 14.5 %   Platelets 144 (L) 145 - 400 K/uL   Neutrophils Relative % 67 %    Neutro Abs 2.9 1.5 - 6.5 K/uL   Lymphocytes Relative 22 %   Lymphs Abs 1.0 0.9 - 3.3 K/uL   Monocytes Relative 7 %   Monocytes Absolute 0.3 0.1 - 0.9 K/uL   Eosinophils Relative 3 %   Eosinophils Absolute 0.2 0.0 - 0.5 K/uL   Basophils Relative 1 %   Basophils Absolute 0.0 0.0 - 0.1 K/uL    Comment: Performed at [facility], [place] [address] [place], [place] [address]  Comprehensive metabolic panel     Status: None   Collection Time: 08/03/17  9:37 AM  Result Value Ref Range   Sodium 140 136 - 145 mmol/L   Potassium 4.3 3.5 - 5.1 mmol/L   Chloride 105 98 - 109 mmol/L   CO2 26 22 - 29 mmol/L   Glucose, Bld 137 70 - 140 mg/dL   BUN 17 7 - 26 mg/dL   Creatinine, Ser 1.00 0.60 - 1.10 mg/dL   Calcium 9.5 8.4 - 10.4 mg/dL   Total Protein 7.6 6.4 - 8.3 g/dL   Albumin 3.7 3.5 - 5.0 g/dL   AST 20 5 - 34 U/L   ALT 13 0 - 55 U/L   Alkaline Phosphatase 109 40 - 150 U/L   Total Bilirubin 0.8 0.2 - 1.2 mg/dL   GFR calc non Af Amer >60 >60 mL/min   GFR calc Af Amer >60 >60 mL/min    Comment: (NOTE) The eGFR has been calculated using the CKD EPI equation. This calculation has not been validated in all clinical situations. eGFR's persistently <60 mL/min signify possible Chronic Kidney Disease.    Anion gap 9 3 - 11    Comment: Performed at [facility], [place] [address], [place], [place] [address]  Comprehensive metabolic panel     Status: Abnormal   Collection Time: 08/24/17  1:21 PM  Result Value Ref Range   Sodium 139 136 - 145 mmol/L   Potassium 4.0 3.5 - 5.1 mmol/L   Chloride 104 98 - 109 mmol/L   CO2 22 22 - 29 mmol/L   Glucose, Bld 104 70 - 140 mg/dL   BUN 26 7 - 26 mg/dL   Creatinine, Ser 1.16 (H) 0.60 - 1.10 mg/dL   Calcium 9.6 8.4 - 10.4 mg/dL   Total Protein 8.2 6.4 - 8.3 g/dL   Albumin 4.0 3.5 - 5.0 g/dL   AST 22 5 - 34 U/L   ALT 18 0 - 55 U/L   Alkaline Phosphatase 119 40 - 150 U/L   Total Bilirubin 1.0 0.2 - 1.2  mg/dL   GFR calc non Af Amer 54 (L) >60 mL/min   GFR calc Af Amer >60 >60 mL/min    Comment: (NOTE) The eGFR has been calculated using the CKD EPI equation. This calculation has not been validated in all clinical situations. eGFR's persistently <60 mL/min signify possible Chronic Kidney Disease.    Anion gap 13 (H) 3 - 11    Comment: Performed at [facility], [place] [address] [place], [place] [address]   Objective  Body mass index is 39.01 kg/m. Wt Readings from Last 3 Encounters:  08/30/17 234 lb 6.4 oz (106.3 kg)  08/24/17 232 lb 4.8 oz (105.4 kg)  07/13/17 228 lb (103.4 kg)   Temp Readings from Last 3 Encounters:  08/30/17 97.6 F (36.4 C) (Oral)  08/24/17 98.5 F (36.9 C) (Oral)  08/03/17 97.8 F (36.6 C) (Oral)   BP Readings from Last 3 Encounters:  08/30/17 132/80  08/24/17 128/64  08/03/17 (!) 157/80   Pulse Readings from Last 3 Encounters:  08/30/17 90  08/24/17 95  08/03/17 93    Physical Exam  Constitutional: She is oriented to person, place, and time. Vital signs are normal. She appears well-developed and well-nourished. She is cooperative.  HENT:  Head: Normocephalic and atraumatic.  Mouth/Throat: Oropharynx is clear and moist and mucous membranes are normal.  Eyes: Pupils are equal, round, and reactive to light. Conjunctivae are normal.  Cardiovascular: Normal rate, regular rhythm and normal heart sounds.  Pulmonary/Chest: Effort normal and breath sounds normal.  Neurological: She is alert and oriented to person, place, and time. Gait normal.  Skin: Skin is warm, dry and intact.  Psychiatric: She has a normal mood and affect. Her speech is normal and behavior is normal. Judgment and thought content normal.  Nursing note and vitals reviewed.   Assessment   1. Gallbladder polyp 4 mm and fatty liver  2. Gout 3. HTN  4. History of breast cancer left breast 06/2017 [facility] mammogram normal 5. Pulmonary HTN  6. DM 2 A1C 6.3  06/22/17  7. HM/annual  Plan   1. Will disc with GI further w/u consider HIDA for intermittent RUQ ab pain  2. Check uric acid elevated increase allopurinol 100 to 200 mg qd  3. Cont meds  4. Completed last chemo tx 5/38/19  5. Cont meds [facility] Dr. Harrietta Guardian  Taking revatio 40 mg tid now and pt own her own reduced torsemide 20 mg bid to 20 mg qod  6. Cont metformin 500 mg bid Will need to get copy of eye exam had 01/2017  Will do foot  exam at f/u   7.  flu shot had 01/17/18 Tdap utd  Consider shingrix in future  Will rec twinrix Consider pna 23 in future if has not had   Pap smear  -s/p TAH (w/o cervix only ovaries intact) DUB 2/2 fibroids, adenomyosis  -follows with Dr. [facility] pap neg 08/2008 last saw 01/2017 Dr. Benjie Karvonen need to get records  -obtained records 04/14/17 visit   [facility] 06/2017 normal   Colonoscopy/EGD sch 09/02/17 [facility] Dr. Michail Sermon   Of note eye exam had 01/2017 need to get report at f/u   She is former smoker from mid 82s age 48 to 9 3 cig per day  rec exercise and healthy diet choices   Labs today CBC, uric acid, anemia w/u, MMR, hep A/B  Provider: Dr. Olivia Mackie McLean-Scocuzza-Internal Medicine

## 2017-08-30 NOTE — Progress Notes (Signed)
Pre visit review using our clinic review tool, if applicable. No additional management support is needed unless otherwise documented below in the visit note. 

## 2017-08-31 LAB — IRON,TIBC AND FERRITIN PANEL
%SAT: 16 % (calc) (ref 11–50)
FERRITIN: 145 ng/mL (ref 10–232)
IRON: 69 ug/dL (ref 45–160)
TIBC: 436 mcg/dL (calc) (ref 250–450)

## 2017-08-31 LAB — CBC WITH DIFFERENTIAL/PLATELET
Basophils Absolute: 0 10*3/uL (ref 0.0–0.1)
Basophils Relative: 1 % (ref 0.0–3.0)
EOS PCT: 3.2 % (ref 0.0–5.0)
Eosinophils Absolute: 0.2 10*3/uL (ref 0.0–0.7)
HCT: 40.4 % (ref 36.0–46.0)
Hemoglobin: 13.7 g/dL (ref 12.0–15.0)
Lymphocytes Relative: 17.1 % (ref 12.0–46.0)
Lymphs Abs: 0.8 10*3/uL (ref 0.7–4.0)
MCHC: 33.8 g/dL (ref 30.0–36.0)
MCV: 100.9 fl — ABNORMAL HIGH (ref 78.0–100.0)
MONO ABS: 0.5 10*3/uL (ref 0.1–1.0)
MONOS PCT: 9.6 % (ref 3.0–12.0)
Neutro Abs: 3.3 10*3/uL (ref 1.4–7.7)
Neutrophils Relative %: 69.1 % (ref 43.0–77.0)
Platelets: 166 10*3/uL (ref 150.0–400.0)
RBC: 4 Mil/uL (ref 3.87–5.11)
RDW: 13.9 % (ref 11.5–15.5)
WBC: 4.8 10*3/uL (ref 4.0–10.5)

## 2017-08-31 LAB — HEPATITIS B SURFACE ANTIBODY, QUANTITATIVE: Hepatitis B-Post: <5 m[IU]/mL — ABNORMAL LOW (ref >=10)

## 2017-08-31 LAB — HEPATITIS A ANTIBODY, TOTAL: HEPATITIS A AB,TOTAL: NONREACTIVE

## 2017-08-31 LAB — URIC ACID: URIC ACID, SERUM: 10.8 mg/dL — AB (ref 2.4–7.0)

## 2017-08-31 LAB — MEASLES/MUMPS/RUBELLA IMMUNITY
Mumps IgG: 253 AU/mL
RUBEOLA IGG: 72.9 [AU]/ml
Rubella: 8.6 index

## 2017-08-31 LAB — HEPATITIS A ANTIBODY, IGM: Hep A IgM: NONREACTIVE

## 2017-09-02 ENCOUNTER — Ambulatory Visit (HOSPITAL_COMMUNITY): Payer: BLUE CROSS/BLUE SHIELD | Admitting: Anesthesiology

## 2017-09-02 ENCOUNTER — Encounter (HOSPITAL_COMMUNITY)
Admission: RE | Disposition: A | Discharge status: Home / Self Care | Payer: Self-pay | Source: Ambulatory Visit | Attending: Gastroenterology

## 2017-09-02 ENCOUNTER — Encounter (HOSPITAL_COMMUNITY): Payer: Self-pay

## 2017-09-02 ENCOUNTER — Ambulatory Visit (HOSPITAL_COMMUNITY)
Admission: RE | Admit: 2017-09-02 | Discharge: 2017-09-02 | Disposition: A | Discharge status: Home / Self Care | Payer: BLUE CROSS/BLUE SHIELD | Source: Ambulatory Visit | Attending: Gastroenterology | Admitting: Gastroenterology

## 2017-09-02 ENCOUNTER — Other Ambulatory Visit: Payer: Self-pay | Admitting: Internal Medicine

## 2017-09-02 DIAGNOSIS — K259 Gastric ulcer, unspecified as acute or chronic, without hemorrhage or perforation: Secondary | ICD-10-CM | POA: Diagnosis not present

## 2017-09-02 DIAGNOSIS — E119 Type 2 diabetes mellitus without complications: Secondary | ICD-10-CM | POA: Diagnosis not present

## 2017-09-02 DIAGNOSIS — M109 Gout, unspecified: Secondary | ICD-10-CM

## 2017-09-02 DIAGNOSIS — Z7984 Long term (current) use of oral hypoglycemic drugs: Secondary | ICD-10-CM | POA: Insufficient documentation

## 2017-09-02 DIAGNOSIS — K640 First degree hemorrhoids: Secondary | ICD-10-CM | POA: Diagnosis not present

## 2017-09-02 DIAGNOSIS — K2900 Acute gastritis without bleeding: Secondary | ICD-10-CM | POA: Insufficient documentation

## 2017-09-02 DIAGNOSIS — K2950 Unspecified chronic gastritis without bleeding: Secondary | ICD-10-CM | POA: Diagnosis not present

## 2017-09-02 DIAGNOSIS — Z87891 Personal history of nicotine dependence: Secondary | ICD-10-CM | POA: Diagnosis not present

## 2017-09-02 DIAGNOSIS — E669 Obesity, unspecified: Secondary | ICD-10-CM | POA: Insufficient documentation

## 2017-09-02 DIAGNOSIS — K649 Unspecified hemorrhoids: Secondary | ICD-10-CM | POA: Diagnosis not present

## 2017-09-02 DIAGNOSIS — I10 Essential (primary) hypertension: Secondary | ICD-10-CM | POA: Diagnosis not present

## 2017-09-02 DIAGNOSIS — K5730 Diverticulosis of large intestine without perforation or abscess without bleeding: Secondary | ICD-10-CM | POA: Diagnosis not present

## 2017-09-02 DIAGNOSIS — Z1211 Encounter for screening for malignant neoplasm of colon: Secondary | ICD-10-CM

## 2017-09-02 DIAGNOSIS — B9681 Helicobacter pylori [H. pylori] as the cause of diseases classified elsewhere: Secondary | ICD-10-CM | POA: Insufficient documentation

## 2017-09-02 DIAGNOSIS — R6881 Early satiety: Secondary | ICD-10-CM | POA: Insufficient documentation

## 2017-09-02 HISTORY — PX: SUBMUCOSAL INJECTION: SHX5543

## 2017-09-02 HISTORY — PX: BIOPSY: SHX5522

## 2017-09-02 HISTORY — PX: ESOPHAGOGASTRODUODENOSCOPY (EGD) WITH PROPOFOL: SHX5813

## 2017-09-02 HISTORY — PX: COLONOSCOPY WITH PROPOFOL: SHX5780

## 2017-09-02 LAB — GLUCOSE, CAPILLARY: GLUCOSE-CAPILLARY: 95 mg/dL (ref 65–99)

## 2017-09-02 SURGERY — COLONOSCOPY WITH PROPOFOL
Anesthesia: Monitor Anesthesia Care

## 2017-09-02 MED ORDER — PHENYLEPHRINE 40 MCG/ML (10ML) SYRINGE FOR IV PUSH (FOR BLOOD PRESSURE SUPPORT)
PREFILLED_SYRINGE | INTRAVENOUS | Status: DC | PRN
  Administered 2017-09-02: 40 ug via INTRAVENOUS

## 2017-09-02 MED ORDER — PROPOFOL 10 MG/ML IV BOLUS
INTRAVENOUS | Status: AC
  Filled 2017-09-02: qty 60

## 2017-09-02 MED ORDER — PROPOFOL 500 MG/50ML IV EMUL
INTRAVENOUS | Status: DC | PRN
  Administered 2017-09-02: 150 ug/kg/min via INTRAVENOUS

## 2017-09-02 MED ORDER — SODIUM CHLORIDE 0.9 % IV SOLN: INTRAVENOUS | Status: DC

## 2017-09-02 MED ORDER — PROPOFOL 10 MG/ML IV BOLUS
INTRAVENOUS | Status: DC | PRN
  Administered 2017-09-02: 20 mg via INTRAVENOUS

## 2017-09-02 MED ORDER — ALLOPURINOL 100 MG PO TABS: 200.0000 mg | ORAL_TABLET | Freq: Every day | ORAL | 1 refills | Status: DC

## 2017-09-02 MED ORDER — EPINEPHRINE PF 1 MG/10ML IJ SOSY
PREFILLED_SYRINGE | INTRAMUSCULAR | Status: AC
  Filled 2017-09-02: qty 10

## 2017-09-02 MED ORDER — LACTATED RINGERS IV SOLN
INTRAVENOUS | Status: DC
  Administered 2017-09-02: 08:00:00 via INTRAVENOUS

## 2017-09-02 MED ORDER — SODIUM CHLORIDE 0.9 % IJ SOLN
PREFILLED_SYRINGE | INTRAMUSCULAR | Status: DC | PRN
  Administered 2017-09-02: 4 mL

## 2017-09-02 MED ORDER — LIDOCAINE 2% (20 MG/ML) 5 ML SYRINGE
INTRAMUSCULAR | Status: DC | PRN
  Administered 2017-09-02: 100 mg via INTRAVENOUS

## 2017-09-02 SURGICAL SUPPLY — 24 items

## 2017-09-02 NOTE — Op Note (Signed)
[facility] Patient Name: [name] Procedure Date: 09/02/2017 MRN: [id number] Attending MD: [name] , MD Date of Birth: 02-10-1966 CSN: [id number] Age: 52 Admit Type: Inpatient Procedure:                Upper GI endoscopy Indications:              Early satiety Providers:                [name], MD, Cleda Daub, RN, Alan Mulder, Technician, Dione Booze, CRNA Referring MD:             Nino Glow Mclean-Scocuzza MD, MD Medicines:                Propofol per Anesthesia, Monitored Anesthesia Care Complications:            No immediate complications. Estimated Blood Loss:     Estimated blood loss: 5 mL requiring treatment with                            epinephrine. Procedure:                Pre-Anesthesia Assessment:                           - Prior to the procedure, a History and Physical                            was performed, and patient medications and                            allergies were reviewed. The patient's tolerance of                            previous anesthesia was also reviewed. The risks                            and benefits of the procedure and the sedation                            options and risks were discussed with the patient.                            All questions were answered, and informed consent                            was obtained. Prior Anticoagulants: The patient has                            taken no previous anticoagulant or antiplatelet                            agents. ASA Grade Assessment: III - A patient with  severe systemic disease. After reviewing the risks                            and benefits, the patient was deemed in                            satisfactory condition to undergo the procedure.                           After obtaining informed consent, the endoscope was                            passed under direct vision.  Throughout the                            procedure, the patient's blood pressure, pulse, and                            oxygen saturations were monitored continuously. The                            [id number] ([id number]) scope was introduced through the                            mouth, and advanced to the second part of duodenum.                            The upper GI endoscopy was accomplished without                            difficulty. The patient tolerated the procedure                            well. Scope In: Scope Out: Findings:      The examined esophagus was normal.      The Z-line was regular and was found 46 cm from the incisors.      Few non-bleeding superficial gastric ulcers with no stigmata of bleeding       were found in the gastric antrum. The largest lesion was 5 mm in largest       dimension.      Segmental moderate inflammation characterized by congestion (edema),       erythema and linear erosions was found in the gastric antrum. Biopsies       were taken with a cold forceps for histology. Estimated blood loss: 5 mL       requiring treatment with epinephrine.      Segmental mild inflammation characterized by congestion (edema) and       erythema was found in the gastric fundus.      The examined duodenum was normal.      - No motility seen in stomach during EGD. Impression:               - Normal esophagus.                           - Z-line  regular, 46 cm from the incisors.                           - Non-bleeding gastric ulcers with no stigmata of                            bleeding.                           - Gastritis. Biopsied.                           - Acute gastritis.                           - Normal examined duodenum.                           - Suspect gastroparesis source of early satiety but                            await biopsy results. Moderate Sedation:      N/A- Per Anesthesia Care Recommendation:           - Await pathology  results. Procedure Code(s):        --- Professional ---                           [phone], Esophagogastroduodenoscopy, flexible,                            transoral; with biopsy, single or multiple Diagnosis Code(s):        --- Professional ---                           R68.81, Early satiety                           K25.9, Gastric ulcer, unspecified as acute or                            chronic, without hemorrhage or perforation                           K29.00, Acute gastritis without bleeding                           K29.70, Gastritis, unspecified, without bleeding CPT copyright 2017 American Medical Association. All rights reserved. The codes documented in this report are preliminary and upon coder review may  be revised to meet current compliance requirements. [name], MD 09/02/2017 9:36:14 AM This report has been signed electronically. Number of Addenda: 0

## 2017-09-02 NOTE — Transfer of Care (Signed)
Immediate Anesthesia Transfer of Care Note  Patient: [name]  Procedure(s) Performed: COLONOSCOPY WITH PROPOFOL (N/A ) ESOPHAGOGASTRODUODENOSCOPY (EGD) WITH PROPOFOL (N/A ) BIOPSY  Patient Location: PACU and Endoscopy Unit  Anesthesia Type:MAC  Level of Consciousness: drowsy and patient cooperative  Airway & Oxygen Therapy: Patient Spontanous Breathing and Patient connected to face mask oxygen  Post-op Assessment: Report given to RN and Post -op Vital signs reviewed and stable  Post vital signs: Reviewed and stable  Last Vitals:  Vitals Value Taken Time  BP    Temp    Pulse    Resp    SpO2      Last Pain:  Vitals:   09/02/17 0747  TempSrc: Oral  PainSc: 0-No pain         Complications: No apparent anesthesia complications

## 2017-09-02 NOTE — Anesthesia Procedure Notes (Signed)
Procedure Name: MAC Date/Time: 09/02/2017 8:43 AM Performed by: Dione Booze, CRNA Pre-anesthesia Checklist: Patient identified, Emergency Drugs available, Suction available and Patient being monitored Patient Re-evaluated:Patient Re-evaluated prior to induction Oxygen Delivery Method: Nasal cannula Placement Confirmation: positive ETCO2

## 2017-09-02 NOTE — Op Note (Signed)
[facility] Patient Name: [name] Procedure Date: 09/02/2017 MRN: [id number] Attending MD: [name] , MD Date of Birth: 05-13-1965 CSN: [id number] Age: 52 Admit Type: Inpatient Procedure:                Colonoscopy Indications:              Screening for colorectal malignant neoplasm, This                            is the patient's first colonoscopy Providers:                [name], MD, Cleda Daub, RN, Alan Mulder, Technician, Dione Booze, CRNA Referring MD:             Nino Glow Mclean-Scocuzza MD, MD Medicines:                Propofol per Anesthesia, Monitored Anesthesia Care Complications:            No immediate complications. Estimated Blood Loss:     Estimated blood loss: none. Procedure:                Pre-Anesthesia Assessment:                           - Prior to the procedure, a History and Physical                            was performed, and patient medications and                            allergies were reviewed. The patient's tolerance of                            previous anesthesia was also reviewed. The risks                            and benefits of the procedure and the sedation                            options and risks were discussed with the patient.                            All questions were answered, and informed consent                            was obtained. Prior Anticoagulants: The patient has                            taken no previous anticoagulant or antiplatelet                            agents. ASA Grade Assessment: III - A patient with  severe systemic disease. After reviewing the risks                            and benefits, the patient was deemed in                            satisfactory condition to undergo the procedure.                           After obtaining informed consent, the colonoscope                            was  passed under direct vision. Throughout the                            procedure, the patient's blood pressure, pulse, and                            oxygen saturations were monitored continuously. The                            EC-[id number] ([id number]) scope was introduced through                            the anus and advanced to the the cecum, identified                            by appendiceal orifice and ileocecal valve. The                            colonoscopy was performed without difficulty. The                            patient tolerated the procedure well. The quality                            of the bowel preparation was fair and fair but                            repeated irrigation led to a good and adequate                            prep. The ileocecal valve, appendiceal orifice, and                            rectum were photographed. Scope In: 9:06:29 AM Scope Out: 9:15:26 AM Scope Withdrawal Time: 0 hours 6 minutes 12 seconds  Total Procedure Duration: 0 hours 8 minutes 57 seconds  Findings:      The perianal and digital rectal examinations were normal.      Multiple small and large-mouthed diverticula were found in the sigmoid       colon.      Internal hemorrhoids were found during retroflexion. The hemorrhoids       were large and Grade  I (internal hemorrhoids that do not prolapse). Impression:               - Preparation of the colon was fair.                           - Diverticulosis in the sigmoid colon.                           - Internal hemorrhoids.                           - No specimens collected. Moderate Sedation:      N/A- Per Anesthesia Care Recommendation:           - Patient has a contact number available for                            emergencies. The signs and symptoms of potential                            delayed complications were discussed with the                            patient. Return to normal activities tomorrow.                             Written discharge instructions were provided to the                            patient.                           - High fiber diet.                           - Repeat colonoscopy in 10 years for screening                            purposes.                           - Continue present medications. Procedure Code(s):        --- Professional ---                           [phone], Colonoscopy, flexible; diagnostic, including                            collection of specimen(s) by brushing or washing,                            when performed (separate procedure) Diagnosis Code(s):        --- Professional ---                           Z12.11, Encounter for screening for malignant  neoplasm of colon                           K64.0, First degree hemorrhoids                           K57.30, Diverticulosis of large intestine without                            perforation or abscess without bleeding CPT copyright 2017 American Medical Association. All rights reserved. The codes documented in this report are preliminary and upon coder review may  be revised to meet current compliance requirements. [name], MD 09/02/2017 9:41:59 AM This report has been signed electronically. Number of Addenda: 0

## 2017-09-02 NOTE — Interval H&P Note (Signed)
History and Physical Interval Note:  09/02/2017 8:35 AM  [name]  has presented today for surgery, with the diagnosis of Early satiety and screening  The various methods of treatment have been discussed with the patient and family. After consideration of risks, benefits and other options for treatment, the patient has consented to  Procedure(s): COLONOSCOPY WITH PROPOFOL (N/A) ESOPHAGOGASTRODUODENOSCOPY (EGD) WITH PROPOFOL (N/A) as a surgical intervention .  The patient's history has been reviewed, patient examined, no change in status, stable for surgery.  I have reviewed the patient's chart and labs.  Questions were answered to the patient's satisfaction.     [place] C.

## 2017-09-02 NOTE — Anesthesia Postprocedure Evaluation (Signed)
Anesthesia Post Note  Patient: [name]  Procedure(s) Performed: COLONOSCOPY WITH PROPOFOL (N/A ) ESOPHAGOGASTRODUODENOSCOPY (EGD) WITH PROPOFOL (N/A ) BIOPSY     Patient location during evaluation: Endoscopy Anesthesia Type: MAC Level of consciousness: awake and alert Pain management: pain level controlled Vital Signs Assessment: post-procedure vital signs reviewed and stable Respiratory status: spontaneous breathing, nonlabored ventilation, respiratory function stable and patient connected to nasal cannula oxygen Cardiovascular status: stable and blood pressure returned to baseline Postop Assessment: no apparent nausea or vomiting Anesthetic complications: no    Last Vitals:  Vitals:   09/02/17 0923 09/02/17 0936  BP: 109/67 119/77  Pulse: 72 70  Resp: 15 (!) 23  Temp: (!) 36.3 C   SpO2: 96% 91%    Last Pain:  Vitals:   09/02/17 0936  TempSrc:   PainSc: 0-No pain                 Catalina Gravel

## 2017-09-02 NOTE — H&P (Signed)
Date of Initial H&P: 08/27/17  History reviewed, patient examined, no change in status, stable for surgery.

## 2017-09-02 NOTE — Discharge Instructions (Signed)
Esophagogastroduodenoscopy, Care After Refer to this sheet in the next few weeks. These instructions provide you with information about caring for yourself after your procedure. Your health care provider may also give you more specific instructions. Your treatment has been planned according to current medical practices, but problems sometimes occur. Call your health care provider if you have any problems or questions after your procedure. What can I expect after the procedure? After the procedure, it is common to have:  A sore throat.  Nausea.  Bloating.  Dizziness.  Fatigue.  Follow these instructions at home:  Do not eat or drink anything until the numbing medicine (local anesthetic) has worn off and your gag reflex has returned. You will know that the local anesthetic has worn off when you can swallow comfortably.  Do not drive for 24 hours if you received a medicine to help you relax (sedative).  If your health care provider took a tissue sample for testing during the procedure, make sure to get your test results. This is your responsibility. Ask your health care provider or the department performing the test when your results will be ready.  Keep all follow-up visits as told by your health care provider. This is important. Contact a health care provider if:  You cannot stop coughing.  You are not urinating.  You are urinating less than usual. Get help right away if:  You have trouble swallowing.  You cannot eat or drink.  You have throat or chest pain that gets worse.  You are dizzy or light-headed.  You faint.  You have nausea or vomiting.  You have chills.  You have a fever.  You have severe abdominal pain.  You have black, tarry, or bloody stools. This information is not intended to replace advice given to you by your health care provider. Make sure you discuss any questions you have with your health care provider. Document Released: 03/02/2012 Document  Revised: 08/22/2015 Document Reviewed: 02/07/2015 Elsevier Interactive Patient Education  2018 [employer]. Colonoscopy, Adult, Care After This sheet gives you information about how to care for yourself after your procedure. Your doctor may also give you more specific instructions. If you have problems or questions, call your doctor. Follow these instructions at home: General instructions   For the first 24 hours after the procedure: ? Do not drive or use machinery. ? Do not sign important documents. ? Do not drink alcohol. ? Do your daily activities more slowly than normal. ? Eat foods that are soft and easy to digest. ? Rest often.  Take over-the-counter or prescription medicines only as told by your doctor.  It is up to you to get the results of your procedure. Ask your doctor, or the department performing the procedure, when your results will be ready. To help cramping and bloating:  Try walking around.  Put heat on your belly (abdomen) as told by your doctor. Use a heat source that your doctor recommends, such as a moist heat pack or a heating pad. ? Put a towel between your skin and the heat source. ? Leave the heat on for 20-30 minutes. ? Remove the heat if your skin turns bright red. This is especially important if you cannot feel pain, heat, or cold. You can get burned. Eating and drinking  Drink enough fluid to keep your pee (urine) clear or pale yellow.  Return to your normal diet as told by your doctor. Avoid heavy or fried foods that are hard to digest.  Avoid  drinking alcohol for as long as told by your doctor. Contact a doctor if:  You have blood in your poop (stool) 2-3 days after the procedure. Get help right away if:  You have more than a small amount of blood in your poop.  You see large clumps of tissue (blood clots) in your poop.  Your belly is swollen.  You feel sick to your stomach (nauseous).  You throw up (vomit).  You have a fever.  You  have belly pain that gets worse, and medicine does not help your pain.  START PRILOSEC 40 mg by mouth once a day (obtain over-the-counter and take 30 minutes before your first meal).   This information is not intended to replace advice given to you by your health care provider. Make sure you discuss any questions you have with your health care provider. Document Released: 04/18/2010 Document Revised: 12/09/2015 Document Reviewed: 12/09/2015 Elsevier Interactive Patient Education  2017 [employer].

## 2017-09-02 NOTE — Anesthesia Preprocedure Evaluation (Signed)
Anesthesia Evaluation  Patient identified by MRN, date of birth, ID band Patient awake    Reviewed: Allergy & Precautions, NPO status , Patient's Chart, lab work & pertinent test results, reviewed documented beta blocker date and time   History of Anesthesia Complications (+) history of anesthetic complications ("slow to wake up")  Airway Mallampati: III  TM Distance: >3 FB Neck ROM: Full    Dental  (+) Teeth Intact, Dental Advisory Given   Pulmonary sleep apnea and Continuous Positive Airway Pressure Ventilation , former smoker, PE   Pulmonary exam normal breath sounds clear to auscultation       Cardiovascular hypertension, Pt. on home beta blockers and Pt. on medications + DVT  Normal cardiovascular exam Rhythm:Regular Rate:Normal     Neuro/Psych  Headaches,    GI/Hepatic Neg liver ROS, Early satiety   Endo/Other  diabetes, Type 2, Oral Hypoglycemic AgentsObesity   Renal/GU negative Renal ROS     Musculoskeletal negative musculoskeletal ROS (+) Arthritis ,   Abdominal   Peds  Hematology  (+) Blood dyscrasia, anemia ,   Anesthesia Other Findings Day of surgery medications reviewed with the patient.  Reproductive/Obstetrics                             Anesthesia Physical Anesthesia Plan  ASA: III  Anesthesia Plan: MAC   Post-op Pain Management:    Induction: Intravenous  PONV Risk Score and Plan: 2 and Treatment may vary due to age or medical condition  Airway Management Planned: Natural Airway and Simple Face Mask  Additional Equipment:   Intra-op Plan:   Post-operative Plan:   Informed Consent: I have reviewed the patients History and Physical, chart, labs and discussed the procedure including the risks, benefits and alternatives for the proposed anesthesia with the patient or authorized representative who has indicated his/her understanding and acceptance.   Dental  advisory given  Plan Discussed with: CRNA and Anesthesiologist  Anesthesia Plan Comments: (Discussed risks/benefits/alternatives to MAC sedation including need for ventilatory support, hypotension, need for conversion to general anesthesia.  All patient questions answered.  Patient/guardian wishes to proceed.)        Anesthesia Quick Evaluation

## 2017-09-02 NOTE — Anesthesia Procedure Notes (Signed)
Date/Time: 09/02/2017 9:02 AM Performed by: Dione Booze, CRNA

## 2017-09-03 ENCOUNTER — Encounter (HOSPITAL_BASED_OUTPATIENT_CLINIC_OR_DEPARTMENT_OTHER): Payer: Self-pay | Admitting: *Deleted

## 2017-09-03 ENCOUNTER — Other Ambulatory Visit: Payer: Self-pay

## 2017-09-03 ENCOUNTER — Encounter: Payer: Self-pay | Admitting: Internal Medicine

## 2017-09-03 DIAGNOSIS — K760 Fatty (change of) liver, not elsewhere classified: Secondary | ICD-10-CM | POA: Insufficient documentation

## 2017-09-03 NOTE — Progress Notes (Signed)
When completing pre op phone call, pt seemed a little unsure of meds. Xarelto is NOT listed on her PTA medication list and I questioned the pt whether she was on any type of blood thinner for her hx of PE and DVT, and pt stated that she was not on any kind of blood thinning medication. However, there is a note on pre-op phone cal that states that pt should stop her xarelto 3 days prior to procedure, not sure if this is an old entry and pt is no longer taking this med or not. Pt denies taking any blood thinners.

## 2017-09-03 NOTE — Progress Notes (Signed)
Sent electronically

## 2017-09-07 ENCOUNTER — Encounter (HOSPITAL_BASED_OUTPATIENT_CLINIC_OR_DEPARTMENT_OTHER)
Admission: RE | Admit: 2017-09-07 | Discharge: 2017-09-07 | Disposition: A | Discharge status: Home / Self Care | Payer: BLUE CROSS/BLUE SHIELD | Source: Ambulatory Visit | Attending: General Surgery | Admitting: General Surgery

## 2017-09-07 DIAGNOSIS — Z01810 Encounter for preprocedural cardiovascular examination: Secondary | ICD-10-CM | POA: Insufficient documentation

## 2017-09-07 DIAGNOSIS — I10 Essential (primary) hypertension: Secondary | ICD-10-CM | POA: Diagnosis not present

## 2017-09-07 NOTE — Progress Notes (Signed)
EKG reviewed by Dr. Smith Robert, will proceed with surgery as scheduled. Ensure pre surgery drink given with instructions to complete by 0900 dos, pt verbalized understanding.

## 2017-09-09 ENCOUNTER — Encounter (HOSPITAL_BASED_OUTPATIENT_CLINIC_OR_DEPARTMENT_OTHER)
Admission: RE | Disposition: A | Discharge status: Home / Self Care | Payer: Self-pay | Source: Ambulatory Visit | Attending: General Surgery

## 2017-09-09 ENCOUNTER — Encounter (HOSPITAL_BASED_OUTPATIENT_CLINIC_OR_DEPARTMENT_OTHER): Payer: Self-pay | Admitting: Emergency Medicine

## 2017-09-09 ENCOUNTER — Ambulatory Visit (HOSPITAL_BASED_OUTPATIENT_CLINIC_OR_DEPARTMENT_OTHER): Payer: BLUE CROSS/BLUE SHIELD | Admitting: Anesthesiology

## 2017-09-09 ENCOUNTER — Other Ambulatory Visit: Payer: Self-pay

## 2017-09-09 ENCOUNTER — Ambulatory Visit (HOSPITAL_BASED_OUTPATIENT_CLINIC_OR_DEPARTMENT_OTHER)
Admission: RE | Admit: 2017-09-09 | Discharge: 2017-09-09 | Disposition: A | Discharge status: Home / Self Care | Payer: BLUE CROSS/BLUE SHIELD | Source: Ambulatory Visit | Attending: General Surgery | Admitting: General Surgery

## 2017-09-09 DIAGNOSIS — Z882 Allergy status to sulfonamides status: Secondary | ICD-10-CM | POA: Diagnosis not present

## 2017-09-09 DIAGNOSIS — M1990 Unspecified osteoarthritis, unspecified site: Secondary | ICD-10-CM | POA: Diagnosis not present

## 2017-09-09 DIAGNOSIS — Z853 Personal history of malignant neoplasm of breast: Secondary | ICD-10-CM | POA: Diagnosis not present

## 2017-09-09 DIAGNOSIS — Z79899 Other long term (current) drug therapy: Secondary | ICD-10-CM | POA: Diagnosis not present

## 2017-09-09 DIAGNOSIS — Z86711 Personal history of pulmonary embolism: Secondary | ICD-10-CM | POA: Insufficient documentation

## 2017-09-09 DIAGNOSIS — E119 Type 2 diabetes mellitus without complications: Secondary | ICD-10-CM | POA: Diagnosis not present

## 2017-09-09 DIAGNOSIS — Z452 Encounter for adjustment and management of vascular access device: Secondary | ICD-10-CM | POA: Diagnosis not present

## 2017-09-09 DIAGNOSIS — I10 Essential (primary) hypertension: Secondary | ICD-10-CM | POA: Insufficient documentation

## 2017-09-09 DIAGNOSIS — Z803 Family history of malignant neoplasm of breast: Secondary | ICD-10-CM | POA: Insufficient documentation

## 2017-09-09 DIAGNOSIS — G4733 Obstructive sleep apnea (adult) (pediatric): Secondary | ICD-10-CM | POA: Insufficient documentation

## 2017-09-09 DIAGNOSIS — Z7984 Long term (current) use of oral hypoglycemic drugs: Secondary | ICD-10-CM | POA: Diagnosis not present

## 2017-09-09 DIAGNOSIS — Z881 Allergy status to other antibiotic agents status: Secondary | ICD-10-CM | POA: Diagnosis not present

## 2017-09-09 DIAGNOSIS — Z7951 Long term (current) use of inhaled steroids: Secondary | ICD-10-CM | POA: Diagnosis not present

## 2017-09-09 DIAGNOSIS — E785 Hyperlipidemia, unspecified: Secondary | ICD-10-CM | POA: Diagnosis not present

## 2017-09-09 DIAGNOSIS — C50212 Malignant neoplasm of upper-inner quadrant of left female breast: Secondary | ICD-10-CM | POA: Insufficient documentation

## 2017-09-09 DIAGNOSIS — Z880 Allergy status to penicillin: Secondary | ICD-10-CM | POA: Diagnosis not present

## 2017-09-09 HISTORY — PX: PORT-A-CATH REMOVAL: SHX5289

## 2017-09-09 LAB — GLUCOSE, CAPILLARY
Glucose-Capillary: 113 mg/dL — ABNORMAL HIGH (ref 65–99)
Glucose-Capillary: 119 mg/dL — ABNORMAL HIGH (ref 65–99)

## 2017-09-09 SURGERY — REMOVAL PORT-A-CATH
Anesthesia: Monitor Anesthesia Care | Site: Chest | Laterality: Right

## 2017-09-09 MED ORDER — CHLORHEXIDINE GLUCONATE CLOTH 2 % EX PADS: 6.0000 | MEDICATED_PAD | Freq: Once | CUTANEOUS | Status: DC

## 2017-09-09 MED ORDER — FENTANYL CITRATE (PF) 100 MCG/2ML IJ SOLN
INTRAMUSCULAR | Status: AC
Start: 2017-09-09 — End: ?
  Filled 2017-09-09: qty 2

## 2017-09-09 MED ORDER — MIDAZOLAM HCL 2 MG/2ML IJ SOLN
INTRAMUSCULAR | Status: AC
  Filled 2017-09-09: qty 2

## 2017-09-09 MED ORDER — GABAPENTIN 300 MG PO CAPS
300.0000 mg | ORAL_CAPSULE | ORAL | Status: AC
  Administered 2017-09-09: 300 mg via ORAL

## 2017-09-09 MED ORDER — CIPROFLOXACIN IN D5W 400 MG/200ML IV SOLN
400.0000 mg | INTRAVENOUS | Status: AC
  Administered 2017-09-09: 400 mg via INTRAVENOUS

## 2017-09-09 MED ORDER — LIDOCAINE-EPINEPHRINE (PF) 1 %-1:200000 IJ SOLN
INTRAMUSCULAR | Status: DC | PRN
  Administered 2017-09-09: 18 mL via SUBCUTANEOUS

## 2017-09-09 MED ORDER — ONDANSETRON HCL 4 MG/2ML IJ SOLN
INTRAMUSCULAR | Status: AC
  Filled 2017-09-09: qty 2

## 2017-09-09 MED ORDER — CIPROFLOXACIN IN D5W 400 MG/200ML IV SOLN
INTRAVENOUS | Status: DC | PRN
  Administered 2017-09-09: 400 mg via INTRAVENOUS

## 2017-09-09 MED ORDER — CIPROFLOXACIN IN D5W 400 MG/200ML IV SOLN
INTRAVENOUS | Status: AC
  Filled 2017-09-09: qty 200

## 2017-09-09 MED ORDER — OXYCODONE HCL 5 MG PO TABS: 5.0000 mg | ORAL_TABLET | Freq: Four times a day (QID) | ORAL | 0 refills | Status: DC | PRN

## 2017-09-09 MED ORDER — MIDAZOLAM HCL 2 MG/2ML IJ SOLN
1.0000 mg | INTRAMUSCULAR | Status: DC | PRN
  Administered 2017-09-09: 2 mg via INTRAVENOUS

## 2017-09-09 MED ORDER — SCOPOLAMINE 1 MG/3DAYS TD PT72: 1.0000 | MEDICATED_PATCH | Freq: Once | TRANSDERMAL | Status: DC | PRN

## 2017-09-09 MED ORDER — FENTANYL CITRATE (PF) 100 MCG/2ML IJ SOLN: 25.0000 ug | INTRAMUSCULAR | Status: DC | PRN

## 2017-09-09 MED ORDER — ACETAMINOPHEN 500 MG PO TABS
1000.0000 mg | ORAL_TABLET | ORAL | Status: AC
  Administered 2017-09-09: 1000 mg via ORAL

## 2017-09-09 MED ORDER — LACTATED RINGERS IV SOLN
INTRAVENOUS | Status: DC
  Administered 2017-09-09: 12:00:00 via INTRAVENOUS

## 2017-09-09 MED ORDER — LIDOCAINE HCL (CARDIAC) PF 100 MG/5ML IV SOSY
PREFILLED_SYRINGE | INTRAVENOUS | Status: AC
  Filled 2017-09-09: qty 5

## 2017-09-09 MED ORDER — FENTANYL CITRATE (PF) 100 MCG/2ML IJ SOLN
50.0000 ug | INTRAMUSCULAR | Status: DC | PRN
  Administered 2017-09-09: 50 ug via INTRAVENOUS

## 2017-09-09 MED ORDER — PROMETHAZINE HCL 25 MG/ML IJ SOLN: 6.2500 mg | INTRAMUSCULAR | Status: DC | PRN

## 2017-09-09 MED ORDER — GABAPENTIN 300 MG PO CAPS
ORAL_CAPSULE | ORAL | Status: AC
  Filled 2017-09-09: qty 1

## 2017-09-09 MED ORDER — ACETAMINOPHEN 500 MG PO TABS
ORAL_TABLET | ORAL | Status: AC
  Filled 2017-09-09: qty 2

## 2017-09-09 MED ORDER — PROPOFOL 10 MG/ML IV BOLUS
INTRAVENOUS | Status: AC
  Filled 2017-09-09: qty 40

## 2017-09-09 SURGICAL SUPPLY — 36 items
BLADE HEX COATED 2.75 (ELECTRODE) ×2 IMPLANT
BLADE SURG 15 STRL LF DISP TIS (BLADE) ×1 IMPLANT
BLADE SURG 15 STRL SS (BLADE) ×1
CANISTER SUCT 1200ML W/VALVE (MISCELLANEOUS) IMPLANT
CHLORAPREP W/TINT 26ML (MISCELLANEOUS) ×2 IMPLANT
COVER BACK TABLE 60X90IN (DRAPES) ×2 IMPLANT
COVER MAYO STAND STRL (DRAPES) ×2 IMPLANT
DECANTER SPIKE VIAL GLASS SM (MISCELLANEOUS) IMPLANT
DERMABOND ADVANCED (GAUZE/BANDAGES/DRESSINGS) ×1
DERMABOND ADVANCED .7 DNX12 (GAUZE/BANDAGES/DRESSINGS) ×1 IMPLANT
DRAPE LAPAROTOMY 100X72 PEDS (DRAPES) ×2 IMPLANT
DRAPE UTILITY XL STRL (DRAPES) ×2 IMPLANT
ELECT REM PT RETURN 9FT ADLT (ELECTROSURGICAL) ×2
ELECTRODE REM PT RTRN 9FT ADLT (ELECTROSURGICAL) ×1 IMPLANT
GLOVE BIO SURGEON STRL SZ 6 (GLOVE) ×2 IMPLANT
GLOVE BIO SURGEON STRL SZ 6.5 (GLOVE) ×2 IMPLANT
GLOVE BIOGEL PI IND STRL 6.5 (GLOVE) ×3 IMPLANT
GLOVE BIOGEL PI IND STRL 7.0 (GLOVE) ×1 IMPLANT
GLOVE BIOGEL PI INDICATOR 6.5 (GLOVE) ×3
GLOVE BIOGEL PI INDICATOR 7.0 (GLOVE) ×1
GLOVE ECLIPSE 6.5 STRL STRAW (GLOVE) ×2 IMPLANT
GOWN STRL REUS W/ TWL LRG LVL3 (GOWN DISPOSABLE) ×2 IMPLANT
GOWN STRL REUS W/TWL 2XL LVL3 (GOWN DISPOSABLE) ×2 IMPLANT
GOWN STRL REUS W/TWL LRG LVL3 (GOWN DISPOSABLE) ×2
NEEDLE HYPO 25X1 1.5 SAFETY (NEEDLE) ×2 IMPLANT
NS IRRIG 1000ML POUR BTL (IV SOLUTION) IMPLANT
PACK BASIN DAY SURGERY FS (CUSTOM PROCEDURE TRAY) ×2 IMPLANT
PENCIL BUTTON HOLSTER BLD 10FT (ELECTRODE) ×2 IMPLANT
SUT MNCRL AB 4-0 PS2 18 (SUTURE) ×2 IMPLANT
SUT VIC AB 3-0 SH 27 (SUTURE) ×1
SUT VIC AB 3-0 SH 27X BRD (SUTURE) ×1 IMPLANT
SYR CONTROL 10ML LL (SYRINGE) ×2 IMPLANT
TOWEL GREEN STERILE FF (TOWEL DISPOSABLE) ×2 IMPLANT
TOWEL OR NON WOVEN STRL DISP B (DISPOSABLE) ×2 IMPLANT
TUBE CONNECTING 20X1/4 (TUBING) IMPLANT
YANKAUER SUCT BULB TIP NO VENT (SUCTIONS) IMPLANT

## 2017-09-09 NOTE — Discharge Instructions (Addendum)
Post Anesthesia Home Care Instructions  Activity: Get plenty of rest for the remainder of the day. A responsible individual must stay with you for 24 hours following the procedure.  For the next 24 hours, DO NOT: -Drive a car -Paediatric nurse -Drink alcoholic beverages -Take any medication unless instructed by your physician -Make any legal decisions or sign important papers.  Meals: Start with liquid foods such as gelatin or soup. Progress to regular foods as tolerated. Avoid greasy, spicy, heavy foods. If nausea and/or vomiting occur, drink only clear liquids until the nausea and/or vomiting subsides. Call your physician if vomiting continues.  Special Instructions/Symptoms: Your throat may feel dry or sore from the anesthesia or the breathing tube placed in your throat during surgery. If this causes discomfort, gargle with warm salt water. The discomfort should disappear within 24 hours.  If you had a scopolamine patch placed behind your ear for the management of post- operative nausea and/or vomiting:  1. The medication in the patch is effective for 72 hours, after which it should be removed.  Wrap patch in a tissue and discard in the trash. Wash hands thoroughly with soap and water. 2. You may remove the patch earlier than 72 hours if you experience unpleasant side effects which may include dry mouth, dizziness or visual disturbances. 3. Avoid touching the patch. Wash your hands with soap and water after contact with the patch.      [place] Office Phone Number [phone]   POST OP INSTRUCTIONS  Always review your discharge instruction sheet given to you by the facility where your surgery was performed.  IF YOU HAVE DISABILITY OR FAMILY LEAVE FORMS, YOU MUST BRING THEM TO THE OFFICE FOR PROCESSING.  DO NOT GIVE THEM TO YOUR DOCTOR.  1. A prescription for pain medication may be given to you upon discharge.  Take your pain medication as prescribed, if  needed.  If narcotic pain medicine is not needed, then you may take acetaminophen (Tylenol) or ibuprofen (Advil) as needed. You may take Tylenol (acetaminophen) after 6 PM today. 2. Take your usually prescribed medications unless otherwise directed 3. If you need a refill on your pain medication, please contact your pharmacy.  They will contact our office to request authorization.  Prescriptions will not be filled after 5pm or on week-ends. 4. You should eat very light the first 24 hours after surgery, such as soup, crackers, pudding, etc.  Resume your normal diet the day after surgery 5. It is common to experience some constipation if taking pain medication after surgery.  Increasing fluid intake and taking a stool softener will usually help or prevent this problem from occurring.  A mild laxative (Milk of Magnesia or Miralax) should be taken according to package directions if there are no bowel movements after 48 hours. 6. You may shower in 48 hours.  The surgical glue will flake off in 2-3 weeks.   7. ACTIVITIES:  No strenuous activity or heavy lifting for 1 week.   a. You may drive when you no longer are taking prescription pain medication, you can comfortably wear a seatbelt, and you can safely maneuver your car and apply brakes. b. RETURN TO WORK:  __________n/a_______________ Dennis Bast should see your doctor in the office for a follow-up appointment approximately three-four weeks after your surgery.    WHEN TO CALL YOUR DOCTOR: 1. Fever over 101.0 2. Nausea and/or vomiting. 3. Extreme swelling or bruising. 4. Continued bleeding from incision. 5. Increased pain, redness, or drainage  from the incision.  The clinic staff is available to answer your questions during regular business hours.  Please dont hesitate to call and ask to speak to one of the nurses for clinical concerns.  If you have a medical emergency, go to the nearest emergency room or call 911.  A surgeon from [facility] is  always on call at the hospital.  For further questions, please visit [email or web address]   Post Anesthesia Home Care Instructions  Activity: Get plenty of rest for the remainder of the day. A responsible individual must stay with you for 24 hours following the procedure.  For the next 24 hours, DO NOT: -Drive a car -Paediatric nurse -Drink alcoholic beverages -Take any medication unless instructed by your physician -Make any legal decisions or sign important papers.  Meals: Start with liquid foods such as gelatin or soup. Progress to regular foods as tolerated. Avoid greasy, spicy, heavy foods. If nausea and/or vomiting occur, drink only clear liquids until the nausea and/or vomiting subsides. Call your physician if vomiting continues.  Special Instructions/Symptoms: Your throat may feel dry or sore from the anesthesia or the breathing tube placed in your throat during surgery. If this causes discomfort, gargle with warm salt water. The discomfort should disappear within 24 hours.

## 2017-09-09 NOTE — Transfer of Care (Signed)
Immediate Anesthesia Transfer of Care Note  Patient: [name]  Procedure(s) Performed: REMOVAL PORT-A-CATH (Right Chest)  Patient Location: PACU  Anesthesia Type:MAC  Level of Consciousness: awake  Airway & Oxygen Therapy: Patient Spontanous Breathing and Patient connected to face mask oxygen  Post-op Assessment: Report given to RN and Post -op Vital signs reviewed and stable  Post vital signs: Reviewed and stable  Last Vitals:  Vitals Value Taken Time  BP 103/66 09/09/2017  2:11 PM  Temp    Pulse 64 09/09/2017  2:12 PM  Resp 12 09/09/2017  2:12 PM  SpO2 94 % 09/09/2017  2:12 PM  Vitals shown include unvalidated device data.  Last Pain:  Vitals:   09/09/17 1148  TempSrc: Oral  PainSc: 0-No pain         Complications: No apparent anesthesia complications

## 2017-09-09 NOTE — Anesthesia Postprocedure Evaluation (Signed)
Anesthesia Post Note  Patient: [name]  Procedure(s) Performed: REMOVAL PORT-A-CATH (Right Chest)     Patient location during evaluation: PACU Anesthesia Type: MAC Level of consciousness: awake and alert Pain management: pain level controlled Vital Signs Assessment: post-procedure vital signs reviewed and stable Respiratory status: spontaneous breathing and respiratory function stable Cardiovascular status: stable Postop Assessment: no apparent nausea or vomiting Anesthetic complications: no    Last Vitals:  Vitals:   09/09/17 1445 09/09/17 1500  BP: 101/84 108/84  Pulse: 63 66  Resp: 16 15  Temp:    SpO2: 95% 95%    Last Pain:  Vitals:   09/09/17 1500  TempSrc:   PainSc: 0-No pain                 Alaira Level DANIEL

## 2017-09-09 NOTE — Op Note (Signed)
  PRE-OPERATIVE DIAGNOSIS:  un-needed Port-A-Cath for left breast cancer  POST-OPERATIVE DIAGNOSIS:  Same   PROCEDURE:  Procedure(s):  REMOVAL PORT-A-CATH  SURGEON:  Surgeon(s):  Stark Klein, MD  ANESTHESIA:   MAC + local  EBL:   Minimal  SPECIMEN:  None  Complications : none known  Procedure:   Pt was  identified in the holding area and taken to the operating room where she was placed supine on the operating room table.  MAC anesthesia was induced.  The right upper chest was prepped and draped.  The prior incision was anesthetized with local anesthetic.  The incision was opened with a #15 blade.  The subcutaneous tissue was divided with the cautery.  The port was identified and the capsule opened.  The four 2-0 prolene sutures were removed.  The port was then removed and pressure held on the tract.  The catheter appeared intact without evidence of breakage, length was 18.5 cm.  The wound was inspected for hemostasis, which was achieved with cautery.  The wound was closed with 3-0 vicryl deep dermal interrupted sutures and 4-0 Monocryl running subcuticular suture.  The wound was cleaned, dried, and dressed with dermabond.  The patient was awakened from anesthesia and taken to the PACU in stable condition.  Needle, sponge, and instrument counts are correct.

## 2017-09-09 NOTE — H&P (Signed)
[name] is an 52 y.o. female.   Chief Complaint: h/o left breast cancer HPI:  Pt is a 52 yo F with h/o left breast cancer.  She has completed chemo and desires port removal  Past Medical History:  Diagnosis Date  . Anemia    iron deficiency  . Arthritis   . Borderline diabetes   . Complication of anesthesia    woke up slowly- after hysterectomy- 2015  . Diabetes mellitus, type II ([place])   . DVT (deep venous thrombosis) ([place]) 2014   left leg  . Family history of breast cancer   . Gout   . HTN (hypertension)   . Malignant neoplasm of upper-inner quadrant of left female breast ([place]) 03/06/2016  . Menorrhagia    secondary to uterine fibroids  . OSA (obstructive sleep apnea)    07/25/13 HST AHI 33/hr, severe hypoxemia O2 min 42% and 95% of the time <89%  . PE (pulmonary embolism) 2014   bilateral  . Pulmonary artery hypertension ([place])   . Pulmonary nodule    (10m on loeft lower lobe) found on CT scan July 2014, repeat scan Jan 2015 showed less than 487m . Right ovarian cyst    noted 09/2012   . S/P TAH (total abdominal hysterectomy) 06/07/2013  . Trichomoniasis    05/2011     Past Surgical History:  Procedure Laterality Date  . ABDOMINAL HYSTERECTOMY N/A 06/07/2013   Procedure: HYSTERECTOMY ABDOMINAL WITH BIALTERAL SALPINGECTOMY;  Surgeon: VaElveria RoyalsMD;  [facility];  Service: Gynecology;  Laterality: N/A;  . BIOPSY  09/02/2017   Procedure: BIOPSY;  Surgeon: ScWilford CornerMD;  Location: [facility];  Service: Endoscopy;;  . BREAST LUMPECTOMY Left 07/27/2016   BREAST LUMPECTOMY WITH RADIOACTIVE SEED AND SENTINEL LYMPH NODE BIOPSY (Left)  . BREAST LUMPECTOMY WITH RADIOACTIVE SEED AND SENTINEL LYMPH NODE BIOPSY Left 07/27/2016   Procedure: BREAST LUMPECTOMY WITH RADIOACTIVE SEED AND SENTINEL LYMPH NODE BIOPSY;  Surgeon: FaStark KleinMD;  [facility] Service: General;  Laterality: Left;  . CARDIAC CATHETERIZATION N/A 12/24/2015   Procedure: Right Heart Cath;   Surgeon: JaAdrian ProwsMD;  [facility];  Service: Cardiovascular;  Laterality: N/A;  . CESAREAN SECTION    . COLONOSCOPY    . COLONOSCOPY WITH PROPOFOL N/A 09/02/2017   Procedure: COLONOSCOPY WITH PROPOFOL;  Surgeon: ScWilford CornerMD;  [facility];  Service: Endoscopy;  Laterality: N/A;  . ESOPHAGOGASTRODUODENOSCOPY (EGD) WITH PROPOFOL N/A 09/02/2017   Procedure: ESOPHAGOGASTRODUODENOSCOPY (EGD) WITH PROPOFOL;  Surgeon: ScWilford CornerMD;  [facility];  Service: Endoscopy;  Laterality: N/A;  . IR CV LINE INJECTION  07/27/2017  . POLYPECTOMY  2008   Removal of uterine polyp  . PORTA CATH INSERTION  07/27/2016  . PORTACATH PLACEMENT Right 07/27/2016   Procedure: INSERTION PORT-A-CATH;  Surgeon: FaStark KleinMD;  [facility] Service: General;  Laterality: Right;  . SUBMUCOSAL INJECTION  09/02/2017   Procedure: SUBMUCOSAL INJECTION;  Surgeon: ScWilford CornerMD;  [facility];  Service: Endoscopy;;  epi injection    Family History  Problem Relation Age of Onset  . Hypertension Mother   . Kidney disease Mother   . Hypertension Father   . Stroke Sister   . Breast cancer Maternal Grandmother        died at 5751. Breast cancer Paternal Grandmother   . Breast cancer Cousin        pat first cousin dx in her  51s  . Hypertension Brother   . Cancer Maternal Aunt        unknown form  . Breast cancer Paternal Aunt   . Colon cancer Other 23       MGMs brother  . Breast cancer Paternal Aunt   . Cancer Other        breast ca in [place]  . Cancer Other        g uncle colon or stomach ca   Social History:  reports that she quit smoking about 5 years ago. Her smoking use included cigarettes. She has a 3.30 pack-year smoking history. She has never used smokeless tobacco. She reports that she drinks alcohol. She reports that she does not use drugs.  Allergies:  Allergies  Allergen Reactions  . Ampicillin Other (See Comments)    Severe  abdominal pain, dizziness (penicillin is okay)  . Amoxicillin Other (See Comments)    LIGHT-HEADED and CLOSE TO "PASSING OUT"  AND ABDOMINAL PAIN Has patient had a PCN reaction causing immediate rash, facial/tongue/throat swelling, SOB or lightheadedness with hypotension: No Has patient had a PCN reaction causing severe rash involving mucus membranes or skin necrosis: No Has patient had a PCN reaction that required hospitalization No Has patient had a PCN reaction occurring within the last 10 years: No If all of the above answers are "NO", then may proceed with Cephalosporin use.  . Sulfa Antibiotics Rash and Other (See Comments)    Very bad yeast infection    Medications Prior to Admission  Medication Sig Dispense Refill  . acetaminophen (TYLENOL) 500 MG tablet Take 1,000 mg by mouth every 6 (six) hours as needed for moderate pain or headache.     . allopurinol (ZYLOPRIM) 100 MG tablet Take 2 tablets (200 mg total) by mouth daily. 180 tablet 1  . anastrozole (ARIMIDEX) 1 MG tablet Take 1 mg by mouth daily.  1  . cetirizine (ZYRTEC) 10 MG tablet Take 10 mg by mouth daily.    . metFORMIN (GLUCOPHAGE) 500 MG tablet Take 1 tablet (500 mg total) by mouth 2 (two) times daily with a meal. 60 tablet 2  . metoprolol tartrate (LOPRESSOR) 25 MG tablet Take 1 tablet (25 mg total) by mouth 2 (two) times daily. (Patient taking differently: Take 25 mg by mouth daily. ) 180 tablet 1  . Multiple Vitamin (MULTIVITAMIN) tablet Take 1 tablet by mouth daily.    . OPSUMIT 10 MG TABS Take 10 mg by mouth daily.  8  . Polyethyl Glycol-Propyl Glycol (SYSTANE OP) Place 1 drop into both eyes daily as needed (dry eyes).     . sildenafil (REVATIO) 20 MG tablet Take 40 mg by mouth 3 (three) times daily.     Marland Kitchen spironolactone (ALDACTONE) 50 MG tablet Take 1 tablet (50 mg total) by mouth daily. 90 tablet 1  . telmisartan (MICARDIS) 40 MG tablet Take 1 tablet (40 mg total) by mouth daily. 90 tablet 3  . torsemide (DEMADEX)  20 MG tablet Take 20 mg by mouth every other day.     . triamcinolone cream (KENALOG) 0.1 % Apply 1 application topically daily as needed (leg discoloration).    Marland Kitchen UPTRAVI 400 MCG TABS Take 400 mcg by mouth daily.   10  . albuterol (PROVENTIL HFA;VENTOLIN HFA) 108 (90 Base) MCG/ACT inhaler Inhale 2 puffs into the lungs every 6 (six) hours as needed for wheezing or shortness of breath. 1 Inhaler 2  . Cholecalciferol 50000 units capsule Take 1 capsule (50,000  Units total) by mouth once a week. 13 capsule 1  . fluticasone (FLONASE) 50 MCG/ACT nasal spray Place 2 sprays into both nostrils daily.     . Fluticasone-Salmeterol (ADVAIR DISKUS) 250-50 MCG/DOSE AEPB Inhale 1 puff into the lungs 2 (two) times daily. Rinse mouth 60 each 5    Results for orders placed or performed during the hospital encounter of 09/09/17 (from the past 48 hour(s))  Glucose, capillary     Status: Abnormal   Collection Time: 09/09/17 11:54 AM  Result Value Ref Range   Glucose-Capillary 113 (H) 65 - 99 mg/dL   No results found.  Review of Systems  All other systems reviewed and are negative.   Blood pressure 98/70, pulse 80, temperature 97.6 F (36.4 C), temperature source Oral, resp. rate 18, height 5' 5" (1.651 m), weight 107.7 kg (237 lb 6.4 oz), last menstrual period 05/11/2013, SpO2 99 %. Physical Exam  Constitutional: She is oriented to person, place, and time. She appears well-developed and well-nourished. No distress.  HENT:  Head: Normocephalic and atraumatic.  Eyes: Pupils are equal, round, and reactive to light. No scleral icterus.  Respiratory: Effort normal.  Right port   GI: Soft.  Neurological: She is alert and oriented to person, place, and time.  Skin: Skin is warm and dry. She is not diaphoretic.  Psychiatric: She has a normal mood and affect. Her behavior is normal. Judgment and thought content normal.     Assessment/Plan H/o left breast cancer. Plan port removal Reviewed risks.    Stark Klein, MD 09/09/2017, 12:55 PM

## 2017-09-09 NOTE — Anesthesia Procedure Notes (Signed)
Procedure Name: MAC Date/Time: 09/09/2017 1:55 PM Performed by: Lieutenant Diego, CRNA Pre-anesthesia Checklist: Patient identified, Timeout performed, Emergency Drugs available, Suction available and Patient being monitored Patient Re-evaluated:Patient Re-evaluated prior to induction Oxygen Delivery Method: Simple face mask Preoxygenation: Pre-oxygenation with 100% oxygen Induction Type: IV induction

## 2017-09-09 NOTE — Anesthesia Preprocedure Evaluation (Signed)
Anesthesia Evaluation  Patient identified by MRN, date of birth, ID band Patient awake    Reviewed: Allergy & Precautions, NPO status , Patient's Chart, lab work & pertinent test results, reviewed documented beta blocker date and time   History of Anesthesia Complications Negative for: history of anesthetic complications ("slow to wake up")  Airway Mallampati: III  TM Distance: >3 FB Neck ROM: Full    Dental  (+) Teeth Intact, Dental Advisory Given   Pulmonary sleep apnea and Continuous Positive Airway Pressure Ventilation , former smoker, PE   Pulmonary exam normal breath sounds clear to auscultation       Cardiovascular hypertension, Pt. on home beta blockers and Pt. on medications + DVT  Normal cardiovascular exam Rhythm:Regular Rate:Normal     Neuro/Psych  Headaches, negative psych ROS   GI/Hepatic negative GI ROS, Neg liver ROS,   Endo/Other  diabetes, Type 2, Oral Hypoglycemic AgentsObesity   Renal/GU negative Renal ROS     Musculoskeletal negative musculoskeletal ROS (+) Arthritis ,   Abdominal   Peds  Hematology  (+) Blood dyscrasia, anemia ,   Anesthesia Other Findings Day of surgery medications reviewed with the patient.  Reproductive/Obstetrics                             Anesthesia Physical  Anesthesia Plan  ASA: III  Anesthesia Plan: MAC   Post-op Pain Management:    Induction: Intravenous  PONV Risk Score and Plan: 2 and Ondansetron and Propofol infusion  Airway Management Planned: Natural Airway and Simple Face Mask  Additional Equipment:   Intra-op Plan:   Post-operative Plan:   Informed Consent: I have reviewed the patients History and Physical, chart, labs and discussed the procedure including the risks, benefits and alternatives for the proposed anesthesia with the patient or authorized representative who has indicated his/her understanding and  acceptance.   Dental advisory given  Plan Discussed with: CRNA and Anesthesiologist  Anesthesia Plan Comments:         Anesthesia Quick Evaluation

## 2017-09-10 ENCOUNTER — Encounter (HOSPITAL_BASED_OUTPATIENT_CLINIC_OR_DEPARTMENT_OTHER): Payer: Self-pay | Admitting: General Surgery

## 2017-09-26 DIAGNOSIS — G4733 Obstructive sleep apnea (adult) (pediatric): Secondary | ICD-10-CM | POA: Diagnosis not present

## 2017-10-26 DIAGNOSIS — G4733 Obstructive sleep apnea (adult) (pediatric): Secondary | ICD-10-CM | POA: Diagnosis not present

## 2017-10-29 DIAGNOSIS — I2721 Secondary pulmonary arterial hypertension: Secondary | ICD-10-CM | POA: Diagnosis not present

## 2017-10-29 DIAGNOSIS — R0902 Hypoxemia: Secondary | ICD-10-CM | POA: Diagnosis not present

## 2017-10-29 DIAGNOSIS — N3000 Acute cystitis without hematuria: Secondary | ICD-10-CM | POA: Diagnosis not present

## 2017-10-29 DIAGNOSIS — I50812 Chronic right heart failure: Secondary | ICD-10-CM | POA: Diagnosis not present

## 2017-11-08 ENCOUNTER — Inpatient Hospital Stay: Payer: BLUE CROSS/BLUE SHIELD | Attending: Hematology and Oncology | Admitting: Adult Health

## 2017-11-26 DIAGNOSIS — G4733 Obstructive sleep apnea (adult) (pediatric): Secondary | ICD-10-CM | POA: Diagnosis not present

## 2017-12-27 DIAGNOSIS — G4733 Obstructive sleep apnea (adult) (pediatric): Secondary | ICD-10-CM | POA: Diagnosis not present

## 2017-12-30 ENCOUNTER — Encounter: Payer: Self-pay | Admitting: Internal Medicine

## 2017-12-30 ENCOUNTER — Ambulatory Visit: Payer: BLUE CROSS/BLUE SHIELD | Admitting: Internal Medicine

## 2017-12-30 VITALS — BP 118/90 | HR 87 | Temp 97.8°F | Ht 65.0 in | Wt 233.0 lb

## 2017-12-30 DIAGNOSIS — M109 Gout, unspecified: Secondary | ICD-10-CM | POA: Diagnosis not present

## 2017-12-30 DIAGNOSIS — E119 Type 2 diabetes mellitus without complications: Secondary | ICD-10-CM | POA: Diagnosis not present

## 2017-12-30 DIAGNOSIS — A048 Other specified bacterial intestinal infections: Secondary | ICD-10-CM

## 2017-12-30 DIAGNOSIS — K760 Fatty (change of) liver, not elsewhere classified: Secondary | ICD-10-CM

## 2017-12-30 DIAGNOSIS — G4733 Obstructive sleep apnea (adult) (pediatric): Secondary | ICD-10-CM

## 2017-12-30 DIAGNOSIS — E559 Vitamin D deficiency, unspecified: Secondary | ICD-10-CM

## 2017-12-30 DIAGNOSIS — Z9989 Dependence on other enabling machines and devices: Secondary | ICD-10-CM

## 2017-12-30 DIAGNOSIS — I10 Essential (primary) hypertension: Secondary | ICD-10-CM

## 2017-12-30 DIAGNOSIS — R682 Dry mouth, unspecified: Secondary | ICD-10-CM

## 2017-12-30 DIAGNOSIS — K117 Disturbances of salivary secretion: Secondary | ICD-10-CM

## 2017-12-30 DIAGNOSIS — R252 Cramp and spasm: Secondary | ICD-10-CM

## 2017-12-30 DIAGNOSIS — I272 Pulmonary hypertension, unspecified: Secondary | ICD-10-CM | POA: Diagnosis not present

## 2017-12-30 DIAGNOSIS — K219 Gastro-esophageal reflux disease without esophagitis: Secondary | ICD-10-CM

## 2017-12-30 MED ORDER — OMEPRAZOLE 40 MG PO CPDR: 40.0000 mg | DELAYED_RELEASE_CAPSULE | Freq: Every day | ORAL | 3 refills | Status: DC

## 2017-12-30 NOTE — Progress Notes (Addendum)
Chief Complaint  Patient presents with  . Follow-up   4 mo f/u  1. EGD + 09/02/17 pt reports she never had medications called into her pharmacy for H pylori so never was treated. She reports in the past doxycycline cause abdominal pain like ampicillin/amoxicillin she thinks and she never picked up omeprazole otc 20 mg qd and started taking and she has weird taste in her mouth in am [place] 08/03/17 also with 4 mm gallbladder polyp rec she f/u with GI as well  Fatty liver noted on [place] will disc with pt at f/u and will rec twinrix  2. HTN controlled on micardis 40 mg qd, lopressor 25 mg bid spironolactone 50 mg qd torsamide 20 mg qd prn  3. DM 2 on metformin  4. Chronic respiratory failure with hypoxia with exertion today O2 dropped to 84% and she does not have portable O2 with rest O2 91%. She reports she has not recently been using Cpap due to needed new supplies from her lung doctor Dr. Harrietta Guardian. Review of last notes 10/29/17 echo pending for 3 months at f/u will do 6L O2 testing qhs/overnight oximetry test, wanted proBNP repeat off chemo and meds adjusted as reflected on med list.  Of note OSA pt reports not currently compliant with CPAP due to above but does use O2 at night pulm notes state currently on 2L qhs.  5. Gout still not controlled on allopurinol 200 mg qd and having flares per pt she eats a lot of [place] and recently had some crab legs  6. She c/o dry mouth and leg cramps though she is drinking enough water. Dry mouth worse at night she has tried biotene w/o relief.   Review of Systems  Constitutional: Negative for weight loss.  HENT: Negative for hearing loss.        +dry mouth   Eyes: Negative for blurred vision.  Respiratory: Negative for shortness of breath.   Cardiovascular: Negative for chest pain.  Gastrointestinal: Positive for heartburn. Negative for abdominal pain.  Musculoskeletal: Positive for joint pain.  Skin: Negative for rash.  Neurological: Negative for headaches.   Psychiatric/Behavioral: Negative for depression.   Past Medical History:  Diagnosis Date  . Anemia    iron deficiency  . Arthritis   . Borderline diabetes   . Complication of anesthesia    woke up slowly- after hysterectomy- 2015  . Diabetes mellitus, type II ([place])   . DVT (deep venous thrombosis) ([place]) 2014   left leg  . Family history of breast cancer   . Gout   . HTN (hypertension)   . 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 Menorrhagia    secondary to uterine fibroids  . OSA (obstructive sleep apnea)    07/25/13 HST AHI 33/hr, severe hypoxemia O2 min 42% and 95% of the time <89%  . PE (pulmonary embolism) 2014   bilateral  . Pulmonary artery hypertension ([place])   . Pulmonary nodule    (51m on loeft lower lobe) found on CT scan July 2014, repeat scan Jan 2015 showed less than 455m . Right ovarian cyst    noted 09/2012   . S/P TAH (total abdominal hysterectomy) 06/07/2013  . Trichomoniasis    05/2011    Past Surgical History:  Procedure Laterality Date  . ABDOMINAL HYSTERECTOMY N/A 06/07/2013   Procedure: HYSTERECTOMY ABDOMINAL WITH BIALTERAL SALPINGECTOMY;  Surgeon: VaElveria RoyalsMD;  [facility];  Service: Gynecology;  Laterality: N/A;  .  BIOPSY  09/02/2017   Procedure: BIOPSY;  Surgeon: Wilford Corner, MD;  [facility];  Service: Endoscopy;;  . BREAST LUMPECTOMY Left 07/27/2016   BREAST LUMPECTOMY WITH RADIOACTIVE SEED AND SENTINEL LYMPH NODE BIOPSY (Left)  . BREAST LUMPECTOMY WITH RADIOACTIVE SEED AND SENTINEL LYMPH NODE BIOPSY Left 07/27/2016   Procedure: BREAST LUMPECTOMY WITH RADIOACTIVE SEED AND SENTINEL LYMPH NODE BIOPSY;  Surgeon: Stark Klein, MD;  Location: [facility];  Service: General;  Laterality: Left;  . CARDIAC CATHETERIZATION N/A 12/24/2015   Procedure: Right Heart Cath;  Surgeon: [name], MD;  Location: [facility];  Service: Cardiovascular;  Laterality: N/A;  . CESAREAN SECTION    .  COLONOSCOPY    . COLONOSCOPY WITH PROPOFOL N/A 09/02/2017   Procedure: COLONOSCOPY WITH PROPOFOL;  Surgeon: Wilford Corner, MD;  [facility];  Service: Endoscopy;  Laterality: N/A;  . ESOPHAGOGASTRODUODENOSCOPY (EGD) WITH PROPOFOL N/A 09/02/2017   Procedure: ESOPHAGOGASTRODUODENOSCOPY (EGD) WITH PROPOFOL;  Surgeon: Wilford Corner, MD;  [facility];  Service: Endoscopy;  Laterality: N/A;  . IR CV LINE INJECTION  07/27/2017  . POLYPECTOMY  2008   Removal of uterine polyp  . PORT-A-CATH REMOVAL Right 09/09/2017   Procedure: REMOVAL PORT-A-CATH;  Surgeon: Stark Klein, MD;  Location: [facility];  Service: General;  Laterality: Right;  . PORTA CATH INSERTION  07/27/2016  . PORTACATH PLACEMENT Right 07/27/2016   Procedure: INSERTION PORT-A-CATH;  Surgeon: Stark Klein, MD;  Location: [facility];  Service: General;  Laterality: Right;  . SUBMUCOSAL INJECTION  09/02/2017   Procedure: SUBMUCOSAL INJECTION;  Surgeon: Wilford Corner, MD;  [facility];  Service: Endoscopy;;  epi injection   Family History  Problem Relation Age of Onset  . Hypertension Mother   . Kidney disease Mother   . Hypertension Father   . Stroke Sister   . Breast cancer Maternal Grandmother        died at 3  . Breast cancer Paternal Grandmother   . Breast cancer Cousin        pat first cousin dx in her 19s  . Hypertension Brother   . Cancer Maternal Aunt        unknown form  . Breast cancer Paternal Aunt   . Colon cancer Other 20       MGMs brother  . Breast cancer Paternal Aunt   . Cancer Other        breast ca in [place]  . Cancer Other        g uncle colon or stomach ca   Social History   Socioeconomic History  . Marital status: Single    Spouse name: Not on file  . Number of children: 1  . Years of education: Not on file  . Highest education level: Not on file  Occupational History  . Not on file  Social Needs  . Financial resource strain: Not on file  . Food  insecurity:    Worry: Not on file    Inability: Not on file  . Transportation needs:    Medical: Not on file    Non-medical: Not on file  Tobacco Use  . Smoking status: Former Smoker    Packs/day: 0.33    Years: 10.00    Pack years: 3.30    Types: Cigarettes    Last attempt to quit: 10/04/2011    Years since quitting: 6.2  . Smokeless tobacco: Never Used  Substance and Sexual Activity  . Alcohol use: Yes  Alcohol/week: 0.0 standard drinks    Comment: social  . Drug use: No  . Sexual activity: Yes  Lifestyle  . Physical activity:    Days per week: Not on file    Minutes per session: Not on file  . Stress: Not on file  Relationships  . Social connections:    Talks on phone: Not on file    Gets together: Not on file    Attends religious service: Not on file    Active member of club or organization: Not on file    Attends meetings of clubs or organizations: Not on file    Relationship status: Not on file  . Intimate partner violence:    Fear of current or ex partner: Not on file    Emotionally abused: Not on file    Physically abused: Not on file    Forced sexual activity: Not on file  Other Topics Concern  . 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 acetaminophen (TYLENOL) 500 MG tablet Take 1,000 mg by mouth every 6 (six) hours as needed for moderate pain or headache.   . albuterol (PROVENTIL HFA;VENTOLIN HFA) 108 (90 Base) MCG/ACT inhaler Inhale 2 puffs into the lungs every 6 (six) hours as needed for wheezing or shortness of breath.  . allopurinol (ZYLOPRIM) 100 MG tablet Take 2 tablets (200 mg total) by mouth daily.  Marland Kitchen anastrozole (ARIMIDEX) 1 MG tablet Take 1 mg by mouth daily.  . cetirizine (ZYRTEC) 10 MG tablet Take 10 mg by mouth daily.  . Cholecalciferol  50000 units capsule Take 1 capsule (50,000 Units total) by mouth once a week.  . fluticasone (FLONASE) 50 MCG/ACT nasal spray Place 2 sprays into both nostrils daily.   . Fluticasone-Salmeterol (ADVAIR DISKUS) 250-50 MCG/DOSE AEPB Inhale 1 puff into the lungs 2 (two) times daily. Rinse mouth  . metFORMIN (GLUCOPHAGE) 500 MG tablet Take 1 tablet (500 mg total) by mouth 2 (two) times daily with a meal.  . metoprolol tartrate (LOPRESSOR) 25 MG tablet Take 1 tablet (25 mg total) by mouth 2 (two) times daily. (Patient taking differently: Take 25 mg by mouth daily. )  . Multiple Vitamin (MULTIVITAMIN) tablet Take 1 tablet by mouth daily.  . OPSUMIT 10 MG TABS Take 10 mg by mouth daily.  Marland Kitchen oxyCODONE (OXY IR/ROXICODONE) 5 MG immediate release tablet Take 1 tablet (5 mg total) by mouth every 6 (six) hours as needed for severe pain.  Vladimir Faster Glycol-Propyl Glycol (SYSTANE OP) Place 1 drop into both eyes daily as needed (dry eyes).   . sildenafil (REVATIO) 20 MG tablet Take 40 mg by mouth 3 (three) times daily.   Marland Kitchen spironolactone (ALDACTONE) 50 MG tablet Take 1 tablet (50 mg total) by mouth daily.  Marland Kitchen telmisartan (MICARDIS) 40 MG tablet Take 1 tablet (40 mg total) by mouth daily.  Marland Kitchen torsemide (DEMADEX) 20 MG tablet Take 20 mg by mouth every other day.   . triamcinolone cream (KENALOG) 0.1 % Apply 1 application topically daily as needed (leg discoloration).  Marland Kitchen UPTRAVI 400 MCG TABS Take 400 mcg by mouth daily.    Allergies  Allergen Reactions  . Ampicillin Other (See Comments)    Severe abdominal pain, dizziness (penicillin  is okay)  . Amoxicillin Other (See Comments)    LIGHT-HEADED and CLOSE TO "PASSING OUT"  AND ABDOMINAL PAIN Has patient had a PCN reaction causing immediate rash, facial/tongue/throat swelling, SOB or lightheadedness with hypotension: No Has patient had a PCN reaction causing severe rash involving mucus membranes or skin necrosis: No Has patient had a PCN reaction that required  hospitalization No Has patient had a PCN reaction occurring within the last 10 years: No If all of the above answers are "NO", then may proceed with Cephalosporin use.  . Sulfa Antibiotics Rash and Other (See Comments)    Very bad yeast infection   No results found for this or any previous visit (from the past 2160 hour(s)). Objective  Body mass index is 38.77 kg/m. Wt Readings from Last 3 Encounters:  12/30/17 233 lb (105.7 kg)  09/09/17 237 lb 6.4 oz (107.7 kg)  09/02/17 234 lb (106.1 kg)   Temp Readings from Last 3 Encounters:  12/30/17 97.8 F (36.6 C) (Oral)  09/09/17 97.6 F (36.4 C) (Oral)  09/02/17 (!) 97.3 F (36.3 C) (Oral)   BP Readings from Last 3 Encounters:  12/30/17 118/90  09/09/17 115/86  09/02/17 119/77   Pulse Readings from Last 3 Encounters:  12/30/17 87  09/09/17 66  09/02/17 70    Physical Exam  Constitutional: She is oriented to person, place, and time. Vital signs are normal. She appears well-developed and well-nourished. She is cooperative.  HENT:  Head: Normocephalic and atraumatic.  Mouth/Throat: Oropharynx is clear and moist and mucous membranes are normal.  Eyes: Pupils are equal, round, and reactive to light. Conjunctivae are normal.  Cardiovascular: Normal rate, regular rhythm and normal heart sounds.  1+ leg edema b/l   Pulmonary/Chest: Effort normal and breath sounds normal.  Neurological: She is alert and oriented to person, place, and time. Gait normal.  Skin: Skin is warm, dry and intact.  Psychiatric: She has a normal mood and affect. Her speech is normal and behavior is normal. Judgment and thought content normal. Cognition and memory are normal.  Nursing note and vitals reviewed.   Assessment   1. H pylori, 4 mm GB polyp, fatty liver  2. HTN  3. DM 2  4. Chronic respiratory failure with hypoxia and pulmonary HTN and OSA not using CPAP currently  5. Gout 6. Xerostomia  7. Leg cramps  8. HM Plan   1.  Will CC GI Dr.  Michail Sermon to advise pt states medications were not called and and never tx'ed for H pylori on EGD 08/2017 and will ask he resend medication though pt allergic to Amoxicillin and reports doxycycline causes abdominal pain but willing to try it if needed  F/u with GI fatty liver and GB polyp noted on [place] 07/2017   I did Rx prilosec 40 mg to take 30 minutes before dinner today   2. Cont meds  3. Check labs today CMET, CBC, A1C Of note eye exam had 01/2017 need to get report at f/u Cont meds  4. rec use O2 prn likely needs O2 with exertion  Advised to use advair bid and continue with pulm htn meds as Rx Dr. Harrietta Guardian  Pt also needs supplies with [facility] for cpap currently not using due to no supplies  F/u 01/2018 echo, pfts, qhs oximetry Dr. Lacinda Axon pulm Check NT pro BNP today  5. Check uric acid today  On allopurinol 200 mg qd  6. Disc chewing sugar free gum/biotene otc likely 2/2  meds  7. check labs today including K and mag 8.  Flu shot had this month 12/30/17  MMR immune  Will rec twinrix in future with fatty liver  Tdap utd Consider shingrix in future and pna 23  Hep C negative   Pap smear  -s/p TAH(w/o cervix only ovaries intact)DUB 2/2 fibroids, adenomyosis  -follows with Dr. [facility] pap neg 6/2010last saw 01/2017 Dr. Benjie Karvonen -obtained records 04/14/17 visit  [facility] 06/28/2017 normal see chart [facility]   Colonoscopy/EGD sch 09/02/17 [facility] Dr. Michail Sermon diverticulosis/hemorrhooids f/u in 10 years  Of note eye exam had 01/2017 need to get report at f/u  She is former smoker from mid 19s age 28 to 74 3 cig per day  Check vitamin D h/o low vitamin D  Provider: Dr. Olivia Mackie McLean-[facility]

## 2017-12-30 NOTE — Patient Instructions (Signed)
Please use advair daily   Leg Cramps Leg cramps occur when a muscle or muscles tighten and you have no control over this tightening (involuntary muscle contraction). Muscle cramps can develop in any muscle, but the most common place is in the calf muscles of the leg. Those cramps can occur during exercise or when you are at rest. Leg cramps are painful, and they may last for a few seconds to a few minutes. Cramps may return several times before they finally stop. Usually, leg cramps are not caused by a serious medical problem. In many cases, the cause is not known. Some common causes include:  Overexertion.  Overuse from repetitive motions, or doing the same thing over and over.  Remaining in a certain position for a long period of time.  Improper preparation, form, or technique while performing a sport or an activity.  Dehydration.  Injury.  Side effects of some medicines.  Abnormally low levels of the salts and ions in your blood (electrolytes), especially potassium and calcium. These levels could be low if you are taking water pills (diuretics) or if you are pregnant.  Follow these instructions at home: Watch your condition for any changes. Taking the following actions may help to lessen any discomfort that you are feeling:  Stay well-hydrated. Drink enough fluid to keep your urine clear or pale yellow.  Try massaging, stretching, and relaxing the affected muscle. Do this for several minutes at a time.  For tight or tense muscles, use a warm towel, heating pad, or hot shower water directed to the affected area.  If you are sore or have pain after a cramp, applying ice to the affected area may relieve discomfort. ? Put ice in a plastic bag. ? Place a towel between your skin and the bag. ? Leave the ice on for 20 minutes, 2-3 times per day.  Avoid strenuous exercise for several days if you have been having frequent leg cramps.  Make sure that your diet includes the essential  minerals for your muscles to work normally.  Take medicines only as directed by your health care provider.  Contact a health care provider if:  Your leg cramps get more severe or more frequent, or they do not improve over time.  Your foot becomes cold, numb, or blue. This information is not intended to replace advice given to you by your health care provider. Make sure you discuss any questions you have with your health care provider. Document Released: 04/23/2004 Document Revised: 08/22/2015 Document Reviewed: 02/21/2014 Elsevier Interactive Patient Education  2018 Elsevier Inc.  Helicobacter Pylori Infection Helicobacter pylori infection is an infection in the stomach that is caused by the Helicobacter pylori (H. pylori) bacteria. This type of bacteria often lives in the lining of the stomach. The infection can cause ulcers and irritation (gastritis) in some people. It is the most common cause of ulcers in the stomach (gastric ulcer) and in the upper part of the intestine (duodenal ulcer). Having this infection may also increase the risk of stomach cancer and a type of white blood cell cancer (lymphoma) that affects the stomach. What are the causes? H. pylori is a type of bacteria that is often found in the stomachs of healthy people. The bacteria may be passed from person to person through contact with stool or saliva. It is not known why some people develop ulcers, gastritis, or cancer from the infection. What increases the risk? This condition is more likely to develop in people who:  Have  family members with the infection.  Live with many other people, such as in a dormitory.  Are of African, Hispanic, or Asian descent.  What are the signs or symptoms? Most people with this infection do not have symptoms. If you do have symptoms, they may include:  Heartburn.  Stomach pain.  Nausea.  Vomiting.  Blood-tinged vomit.  Loss of appetite.  Bad breath.  How is this  diagnosed? This condition may be diagnosed based on your symptoms, a physical exam, and various tests. Tests may include:  Blood tests or stool tests to check for the proteins (antibodies) that your body may produce in response to the bacteria. These tests are the best way to confirm the diagnosis.  A breath test to check for the type of gas that the H. pylori bacteria release after breaking down a substance called urea. For the test, you are asked to drink urea. This test is often done after treatment in order to find out if the treatment worked.  A procedure in which a thin, flexible tube with a tiny camera at the end is placed into your stomach and upper intestine (upper endoscopy). Your health care provider may also take tissue samples (biopsy) to test for H. pylori and cancer.  How is this treated? Treatment for this condition usually involves taking a combination of medicines (triple therapy) for a couple of weeks. Triple therapy includes one medicine to reduce the acid in your stomach and two types of antibiotic medicines. Many drug combinations have been approved for treatment. Treatment usually kills the H. pylori and reduces your risk of cancer. You may need to be tested for H. pylori again after treatment. In some cases, the treatment may need to be repeated. Follow these instructions at home:  Take over-the-counter and prescription medicines only as told by your health care provider.  Take your antibiotics as told by your health care provider. Do not stop taking the antibiotics even if you start to feel better.  You can do all your usual activities and eat what you usually do.  Take steps to prevent future infections: ? Wash your hands often. ? Make sure the food you eat has been properly prepared. ? Drink water only from clean sources.  Keep all follow-up visits as told by your health care provider. This is important. Contact a health care provider if:  Your symptoms do not  get better.  Your symptoms return after treatment. This information is not intended to replace advice given to you by your health care provider. Make sure you discuss any questions you have with your health care provider. Document Released: 07/08/2015 Document Revised: 08/22/2015 Document Reviewed: 03/28/2014 Elsevier Interactive Patient Education  2018 [employer].

## 2017-12-30 NOTE — Progress Notes (Signed)
Pre visit review using our clinic review tool, if applicable. No additional management support is needed unless otherwise documented below in the visit note. 

## 2017-12-31 ENCOUNTER — Other Ambulatory Visit: Payer: Self-pay | Admitting: Internal Medicine

## 2017-12-31 DIAGNOSIS — E538 Deficiency of other specified B group vitamins: Secondary | ICD-10-CM

## 2017-12-31 DIAGNOSIS — D7589 Other specified diseases of blood and blood-forming organs: Secondary | ICD-10-CM

## 2017-12-31 DIAGNOSIS — E559 Vitamin D deficiency, unspecified: Secondary | ICD-10-CM

## 2017-12-31 LAB — VITAMIN D 25 HYDROXY (VIT D DEFICIENCY, FRACTURES): VITD: 5.39 ng/mL — ABNORMAL LOW (ref 30.00–100.00)

## 2017-12-31 LAB — COMPREHENSIVE METABOLIC PANEL
ALT: 14 U/L (ref 0–35)
AST: 21 U/L (ref 0–37)
Albumin: 3.6 g/dL (ref 3.5–5.2)
Alkaline Phosphatase: 147 U/L — ABNORMAL HIGH (ref 39–117)
BUN: 22 mg/dL (ref 6–23)
CHLORIDE: 102 meq/L (ref 96–112)
CO2: 31 meq/L (ref 19–32)
Calcium: 9.5 mg/dL (ref 8.4–10.5)
Creatinine, Ser: 1.17 mg/dL (ref 0.40–1.20)
GFR: 62.5 mL/min (ref >60.00)
GLUCOSE: 89 mg/dL (ref 70–99)
POTASSIUM: 5 meq/L (ref 3.5–5.1)
SODIUM: 142 meq/L (ref 135–145)
Total Bilirubin: 1.3 mg/dL — ABNORMAL HIGH (ref 0.2–1.2)
Total Protein: 7.4 g/dL (ref 6.0–8.3)

## 2017-12-31 LAB — CBC WITH DIFFERENTIAL/PLATELET
Basophils Absolute: 0.1 10*3/uL (ref 0.0–0.1)
Basophils Relative: 1.1 % (ref 0.0–3.0)
EOS PCT: 1.9 % (ref 0.0–5.0)
Eosinophils Absolute: 0.1 10*3/uL (ref 0.0–0.7)
HCT: 45.5 % (ref 36.0–46.0)
Hemoglobin: 14.9 g/dL (ref 12.0–15.0)
Lymphocytes Relative: 18.7 % (ref 12.0–46.0)
Lymphs Abs: 0.9 10*3/uL (ref 0.7–4.0)
MCHC: 32.8 g/dL (ref 30.0–36.0)
MCV: 103.4 fl — AB (ref 78.0–100.0)
MONOS PCT: 11.3 % (ref 3.0–12.0)
Monocytes Absolute: 0.6 10*3/uL (ref 0.1–1.0)
Neutro Abs: 3.3 10*3/uL (ref 1.4–7.7)
Neutrophils Relative %: 67 % (ref 43.0–77.0)
Platelets: 178 10*3/uL (ref 150.0–400.0)
RBC: 4.4 Mil/uL (ref 3.87–5.11)
RDW: 14.8 % (ref 11.5–15.5)
WBC: 4.9 10*3/uL (ref 4.0–10.5)

## 2017-12-31 LAB — URIC ACID: Uric Acid, Serum: 10.1 mg/dL — ABNORMAL HIGH (ref 2.4–7.0)

## 2017-12-31 LAB — HEMOGLOBIN A1C: Hgb A1c MFr Bld: 6.2 % (ref 4.6–6.5)

## 2017-12-31 LAB — MAGNESIUM: Magnesium: 1.6 mg/dL (ref 1.5–2.5)

## 2017-12-31 MED ORDER — CHOLECALCIFEROL 1.25 MG (50000 UT) PO CAPS: 50000.0000 [IU] | ORAL_CAPSULE | ORAL | 1 refills | Status: DC

## 2018-01-01 LAB — NT-PROBNP: Pro B Natriuretic peptide (BNP): 4807 pg/mL

## 2018-01-26 DIAGNOSIS — G4733 Obstructive sleep apnea (adult) (pediatric): Secondary | ICD-10-CM | POA: Diagnosis not present

## 2018-02-17 ENCOUNTER — Inpatient Hospital Stay: Payer: BLUE CROSS/BLUE SHIELD | Attending: Hematology and Oncology | Admitting: Hematology and Oncology

## 2018-02-17 NOTE — Assessment & Plan Note (Deleted)
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 Adjuvant chemotherapy with Abraxane and Herceptin (cannot take Taxol because she could not receive steroids)weekly 12 followed by every 3 week Herceptin for one yearuntil May 2019 2. followed by adjuvant radiation08/29/2018-01/05/2017 3. Followed by adjuvant antiestrogen therapystarted 01/28/2017 ------------------------------------------------------------------------------------------------------------------------------- Treatment plan: Adjuvant antiestrogen therapy withanastrozole 1 mg daily 01/28/2017 Adjuvant Herceptin to be completed May 2019  Anastrozole toxicities:Denies any hot flashes arthralgias or myalgias.  Surveillance: 1.Breast exam 02/25/2018: Benign 2.mammograms   April 2019 at [facility]: Benign  Return to clinic in 1 year for follow-up

## 2018-02-26 DIAGNOSIS — G4733 Obstructive sleep apnea (adult) (pediatric): Secondary | ICD-10-CM | POA: Diagnosis not present

## 2018-02-28 ENCOUNTER — Telehealth: Payer: Self-pay

## 2018-02-28 NOTE — Telephone Encounter (Signed)
Copied from [place] [id number]. Topic: Appointment Scheduling - Scheduling Inquiry for Clinic >> Feb 25, 2018 10:09 AM Rayann Heman wrote: Reason for CRM:pt called and stated that she would like an office visit with [facility]. Pt states that she thinks she has a hernia. Pt would like to be worked in. Please advise

## 2018-02-28 NOTE — Telephone Encounter (Signed)
Hey I spoke to pt and she is scheduled for tomorrow 12/02. Thank you!

## 2018-03-01 ENCOUNTER — Ambulatory Visit: Payer: BLUE CROSS/BLUE SHIELD | Admitting: Internal Medicine

## 2018-03-01 ENCOUNTER — Ambulatory Visit: Payer: Self-pay

## 2018-03-01 ENCOUNTER — Other Ambulatory Visit: Payer: Self-pay | Admitting: Internal Medicine

## 2018-03-01 ENCOUNTER — Encounter: Payer: Self-pay | Admitting: Internal Medicine

## 2018-03-01 ENCOUNTER — Ambulatory Visit (INDEPENDENT_AMBULATORY_CARE_PROVIDER_SITE_OTHER): Payer: BLUE CROSS/BLUE SHIELD

## 2018-03-01 VITALS — BP 126/84 | HR 113 | Temp 97.7°F | Ht 65.0 in | Wt 244.0 lb

## 2018-03-01 DIAGNOSIS — I10 Essential (primary) hypertension: Secondary | ICD-10-CM | POA: Diagnosis not present

## 2018-03-01 DIAGNOSIS — R0602 Shortness of breath: Secondary | ICD-10-CM

## 2018-03-01 DIAGNOSIS — M109 Gout, unspecified: Secondary | ICD-10-CM | POA: Diagnosis not present

## 2018-03-01 DIAGNOSIS — R05 Cough: Secondary | ICD-10-CM

## 2018-03-01 DIAGNOSIS — J45909 Unspecified asthma, uncomplicated: Secondary | ICD-10-CM | POA: Diagnosis not present

## 2018-03-01 DIAGNOSIS — R0902 Hypoxemia: Secondary | ICD-10-CM

## 2018-03-01 DIAGNOSIS — R059 Cough, unspecified: Secondary | ICD-10-CM

## 2018-03-01 DIAGNOSIS — J069 Acute upper respiratory infection, unspecified: Secondary | ICD-10-CM

## 2018-03-01 DIAGNOSIS — R1033 Periumbilical pain: Secondary | ICD-10-CM | POA: Diagnosis not present

## 2018-03-01 DIAGNOSIS — I272 Pulmonary hypertension, unspecified: Secondary | ICD-10-CM | POA: Diagnosis not present

## 2018-03-01 DIAGNOSIS — Z91148 Patient's other noncompliance with medication regimen for other reason: Secondary | ICD-10-CM

## 2018-03-01 DIAGNOSIS — E559 Vitamin D deficiency, unspecified: Secondary | ICD-10-CM

## 2018-03-01 DIAGNOSIS — R252 Cramp and spasm: Secondary | ICD-10-CM | POA: Diagnosis not present

## 2018-03-01 DIAGNOSIS — Z9114 Patient's other noncompliance with medication regimen: Secondary | ICD-10-CM

## 2018-03-01 DIAGNOSIS — E876 Hypokalemia: Secondary | ICD-10-CM

## 2018-03-01 DIAGNOSIS — A048 Other specified bacterial intestinal infections: Secondary | ICD-10-CM

## 2018-03-01 LAB — BASIC METABOLIC PANEL
BUN: 18 mg/dL (ref 6–23)
CO2: 31 mEq/L (ref 19–32)
Calcium: 8.7 mg/dL (ref 8.4–10.5)
Chloride: 102 mEq/L (ref 96–112)
Creatinine, Ser: 0.94 mg/dL (ref 0.40–1.20)
GFR: 80.41 mL/min (ref >60.00)
GLUCOSE: 125 mg/dL — AB (ref 70–99)
Potassium: 3.6 mEq/L (ref 3.5–5.1)
Sodium: 140 mEq/L (ref 135–145)

## 2018-03-01 LAB — MAGNESIUM: Magnesium: 1.2 mg/dL — ABNORMAL LOW (ref 1.5–2.5)

## 2018-03-01 MED ORDER — ALBUTEROL SULFATE HFA 108 (90 BASE) MCG/ACT IN AERS: 2.0000 | INHALATION_SPRAY | Freq: Four times a day (QID) | RESPIRATORY_TRACT | 12 refills | Status: DC | PRN

## 2018-03-01 MED ORDER — FLUTICASONE-SALMETEROL 250-50 MCG/DOSE IN AEPB: 1.0000 | INHALATION_SPRAY | Freq: Two times a day (BID) | RESPIRATORY_TRACT | 12 refills | Status: DC

## 2018-03-01 MED ORDER — DOXYCYCLINE HYCLATE 100 MG PO TABS: 100.0000 mg | ORAL_TABLET | Freq: Two times a day (BID) | ORAL | 0 refills | Status: DC

## 2018-03-01 MED ORDER — SPIRONOLACTONE 25 MG PO TABS: 25.0000 mg | ORAL_TABLET | Freq: Every day | ORAL | 1 refills | Status: DC

## 2018-03-01 MED ORDER — PREDNISONE 20 MG PO TABS: 20.0000 mg | ORAL_TABLET | Freq: Every day | ORAL | 0 refills | Status: DC

## 2018-03-01 MED ORDER — BISMUTH SUBSALICYLATE 262 MG/15ML PO SUSP: 30.0000 mL | Freq: Three times a day (TID) | ORAL | 0 refills | Status: DC

## 2018-03-01 MED ORDER — METRONIDAZOLE 500 MG PO TABS: 500.0000 mg | ORAL_TABLET | Freq: Two times a day (BID) | ORAL | 0 refills | Status: DC

## 2018-03-01 MED ORDER — HYDROCOD POLST-CPM POLST ER 10-8 MG/5ML PO SUER: 5.0000 mL | Freq: Every evening | ORAL | 0 refills | Status: DC | PRN

## 2018-03-01 MED ORDER — POTASSIUM CHLORIDE ER 10 MEQ PO TBCR: 10.0000 meq | EXTENDED_RELEASE_TABLET | Freq: Every day | ORAL | 0 refills | Status: DC

## 2018-03-01 MED ORDER — ALLOPURINOL 300 MG PO TABS: 300.0000 mg | ORAL_TABLET | Freq: Every day | ORAL | 3 refills | Status: DC

## 2018-03-01 MED ORDER — COLCHICINE 0.6 MG PO TABS: 0.6000 mg | ORAL_TABLET | Freq: Every day | ORAL | 0 refills | Status: DC

## 2018-03-01 NOTE — Patient Instructions (Addendum)
rec schedule eye exam  Biotene mouth wash theraworx for leg cramps at pharmacy topical magnesium    Low-Purine Diet Purines are compounds that affect the level of uric acid in your body. A low-purine diet is a diet that is low in purines. Eating a low-purine diet can prevent the level of uric acid in your body from getting too high and causing gout or kidney stones or both. What do I need to know about this diet?  Choose low-purine foods. Examples of low-purine foods are listed in the next section.  Drink plenty of fluids, especially water. Fluids can help remove uric acid from your body. Try to drink 8-16 cups (1.9-3.8 L) a day.  Limit foods high in fat, especially saturated fat, as fat makes it harder for the body to get rid of uric acid. Foods high in saturated fat include pizza, cheese, ice cream, whole milk, fried foods, and gravies. Choose foods that are lower in fat and lean sources of protein. Use olive oil when cooking as it contains healthy fats that are not high in saturated fat.  Limit alcohol. Alcohol interferes with the elimination of uric acid from your body. If you are having a gout attack, avoid all alcohol.  Keep in mind that different people's bodies react differently to different foods. You will probably learn over time which foods do or do not affect you. If you discover that a food tends to cause your gout to flare up, avoid eating that food. You can more freely enjoy foods that do not cause problems. If you have any questions about a food item, talk to your dietitian or health care provider. Which foods are low, moderate, and high in purines? The following is a list of foods that are low, moderate, and high in purines. You can eat any amount of the foods that are low in purines. You may be able to have small amounts of foods that are moderate in purines. Ask your health care provider how much of a food moderate in purines you can have. Avoid foods high in  purines. Grains  Foods low in purines: Enriched white bread, pasta, rice, cake, cornbread, popcorn.  Foods moderate in purines: Whole-grain breads and cereals, wheat germ, bran, oatmeal. Uncooked oatmeal. Dry wheat bran or wheat germ.  Foods high in purines: Pancakes, [place] toast, biscuits, muffins. Vegetables  Foods low in purines: All vegetables, except those that are moderate in purines.  Foods moderate in purines: Asparagus, cauliflower, spinach, mushrooms, green peas. Fruits  All fruits are low in purines. Meats and other Protein Foods  Foods low in purines: Eggs, nuts, peanut butter.  Foods moderate in purines: 80-90% lean beef, lamb, veal, pork, poultry, fish, eggs, peanut butter, nuts. Crab, lobster, oysters, and shrimp. Cooked dried beans, peas, and lentils.  Foods high in purines: Anchovies, sardines, herring, mussels, tuna, codfish, scallops, trout, and haddock. [name]. Organ meats (such as liver or kidney). Tripe. Game meat. Goose. Sweetbreads. Dairy  All dairy foods are low in purines. Low-fat and fat-free dairy products are best because they are low in saturated fat. Beverages  Drinks low in purines: Water, carbonated beverages, tea, coffee, cocoa.  Drinks moderate in purines: Soft drinks and other drinks sweetened with high-fructose corn syrup. Juices. To find whether a food or drink is sweetened with high-fructose corn syrup, look at the ingredients list.  Drinks high in purines: Alcoholic beverages (such as beer). Condiments  Foods low in purines: Salt, herbs, olives, pickles, relishes, vinegar.  Foods  moderate in purines: Butter, margarine, oils, mayonnaise. Fats and Oils  Foods low in purines: All types, except gravies and sauces made with meat.  Foods high in purines: Gravies and sauces made with meat. Other Foods  Foods low in purines: Sugars, sweets, gelatin. Cake. Soups made without meat.  Foods moderate in purines: Meat-based or fish-based soups,  broths, or bouillons. Foods and drinks sweetened with high-fructose corn syrup.  Foods high in purines: High-fat desserts (such as ice cream, cookies, cakes, pies, doughnuts, and chocolate). Contact your dietitian for more information on foods that are not listed here. This information is not intended to replace advice given to you by your health care provider. Make sure you discuss any questions you have with your health care provider. Document Released: 07/11/2010 Document Revised: 08/22/2015 Document Reviewed: 02/20/2013 Elsevier Interactive Patient Education  2017 [place] of Breath, Adult Shortness of breath is when a person has trouble breathing enough air, or when a person feels like she or he is having trouble breathing in enough air. Shortness of breath could be a sign of medical problem. Follow these instructions at home: Pay attention to any changes in your symptoms. Take these actions to help with your condition:  Do not smoke. Smoking is a common cause of shortness of breath. If you smoke and you need help quitting, ask your health care provider.  Avoid things that can irritate your airways, such as: ? Mold. ? Dust. ? Air pollution. ? Chemical fumes. ? Things that can cause allergy symptoms (allergens), if you have allergies.  Keep your living space clean and free of mold and dust.  Rest as needed. Slowly return to your usual activities.  Take over-the-counter and prescription medicines, including oxygen and inhaled medicines, only as told by your health care provider.  Keep all follow-up visits as told by your health care provider. This is important.  Contact a health care provider if:  Your condition does not improve as soon as expected.  You have a hard time doing your normal activities, even after you rest.  You have new symptoms. Get help right away if:  Your shortness of breath gets worse.  You have shortness of breath when you are  resting.  You feel light-headed or you faint.  You have a cough that is not controlled with medicines.  You cough up blood.  You have pain with breathing.  You have pain in your chest, arms, shoulders, or abdomen.  You have a fever.  You cannot walk up stairs or exercise the way that you normally do. This information is not intended to replace advice given to you by your health care provider. Make sure you discuss any questions you have with your health care provider. Document Released: 12/09/2000 Document Revised: 10/05/2015 Document Reviewed: 08/22/2015 Elsevier Interactive Patient Education  [employer].

## 2018-03-01 NOTE — Telephone Encounter (Signed)
Incoming call from Patient stating that she was just in for a visit with Dr.  Karlyn Agee -Scocuzza to day and forgot to request a Rx forTussinex cough medicine.  States that Dr.  Olivia Mackie has written   A Rx of Tussinex  before for her.. Its the only thing that gives the patient  relief. Desires for Rx to [facility] into [facility]  On [address].                                    Answer Assessment - Initial Assessment Questions 1. SYMPTOMS: "Do you have any symptoms?"     *No Answer* chronic cough.   2. SEVERITY: If symptoms are present, ask "Are they mild, moderate or severe?"    Patient rate cough as severe., Patient states that Dr.  Olivia Mackie has written Rx  Tussinex before.  Patient forgot to ask Dr Olivia Mackie for Rx whikle at office visit today.  Protocols used: MEDICATION QUESTION CALL-A-AH

## 2018-03-01 NOTE — Progress Notes (Signed)
No chief complaint on file.  F/u  1. Since last visit 12/2017 GI never contacted her to tx H pylori so we will try to treat  2. Umbilical hernia and lower ab pain x 1 month nothing tried. She sees a bulge in the belly button and prior surgical scars in her abdomen  3. H/o chronic respiratory failure 2/2 pulm HTN she c/o increased clear thick phelgm, increased cough, sob with exertion she has also gained ~10 lbs. Echo 05/2017 normal EF with grade 1 DD. She has not been taking spironolactone 50 mg qd only torsemide. She feels bloating in the abdomen. O2 was 88% after exertion put on 2L O2 and came up to 92% and pulse came up to 120 HR. She is not using home O2 nor CPAP 4. Gout left big toe uric acid >10 on allopurinol 200 mg qd  5. C/o leg cramps    Review of Systems  Constitutional: Negative for weight loss.  HENT: Negative for hearing loss.   Eyes: Negative for blurred vision.  Respiratory: Positive for cough, sputum production and shortness of breath.   Cardiovascular: Negative for chest pain.  Gastrointestinal: Positive for abdominal pain.  Musculoskeletal: Positive for joint pain. Negative for falls.  Skin: Negative for rash.  Neurological: Negative for headaches.  Psychiatric/Behavioral: Negative for depression.   Past Medical History:  Diagnosis Date  . Anemia    iron deficiency  . Arthritis   . Borderline diabetes   . Complication of anesthesia    woke up slowly- after hysterectomy- 2015  . Diabetes mellitus, type II ([place])   . DVT (deep venous thrombosis) ([place]) 2014   left leg  . Family history of breast cancer   . Gout   . HTN (hypertension)   . 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 Menorrhagia    secondary to uterine fibroids  . OSA (obstructive sleep apnea)    07/25/13 HST AHI 33/hr, severe hypoxemia O2 min 42% and 95% of the time <89%  . PE (pulmonary embolism) 2014   bilateral  . Pulmonary artery hypertension ([place])   .  Pulmonary nodule    (39m on loeft lower lobe) found on CT scan July 2014, repeat scan Jan 2015 showed less than 458m . Right ovarian cyst    noted 09/2012   . S/P TAH (total abdominal hysterectomy) 06/07/2013  . Trichomoniasis    05/2011    Past Surgical History:  Procedure Laterality Date  . ABDOMINAL HYSTERECTOMY N/A 06/07/2013   Procedure: HYSTERECTOMY ABDOMINAL WITH BIALTERAL SALPINGECTOMY;  Surgeon: VaElveria RoyalsMD;  [facility];  Service: Gynecology;  Laterality: N/A;  . BIOPSY  09/02/2017   Procedure: BIOPSY;  Surgeon: ScWilford CornerMD;  [facility];  Service: Endoscopy;;  . BREAST LUMPECTOMY Left 07/27/2016   BREAST LUMPECTOMY WITH RADIOACTIVE SEED AND SENTINEL LYMPH NODE BIOPSY (Left)  . BREAST LUMPECTOMY WITH RADIOACTIVE SEED AND SENTINEL LYMPH NODE BIOPSY Left 07/27/2016   Procedure: BREAST LUMPECTOMY WITH RADIOACTIVE SEED AND SENTINEL LYMPH NODE BIOPSY;  Surgeon: FaStark KleinMD;  [facility] Service: General;  Laterality: Left;  . CARDIAC CATHETERIZATION N/A 12/24/2015   Procedure: Right Heart Cath;  Surgeon: JaAdrian ProwsMD;  [facility];  Service: Cardiovascular;  Laterality: N/A;  . CESAREAN SECTION    . COLONOSCOPY    . COLONOSCOPY WITH PROPOFOL N/A 09/02/2017   Procedure: COLONOSCOPY WITH PROPOFOL;  Surgeon: ScWilford CornerMD;  [facility];  Service: Endoscopy;  Laterality: N/A;  . ESOPHAGOGASTRODUODENOSCOPY (EGD) WITH PROPOFOL N/A 09/02/2017   Procedure: ESOPHAGOGASTRODUODENOSCOPY (EGD) WITH PROPOFOL;  Surgeon: Wilford Corner, MD;  [facility];  Service: Endoscopy;  Laterality: N/A;  . IR CV LINE INJECTION  07/27/2017  . POLYPECTOMY  2008   Removal of uterine polyp  . PORT-A-CATH REMOVAL Right 09/09/2017   Procedure: REMOVAL PORT-A-CATH;  Surgeon: Stark Klein, MD;  Location: [facility];  Service: General;  Laterality: Right;  . PORTA CATH INSERTION  07/27/2016  . PORTACATH PLACEMENT Right  07/27/2016   Procedure: INSERTION PORT-A-CATH;  Surgeon: Stark Klein, MD;  Location: [facility];  Service: General;  Laterality: Right;  . SUBMUCOSAL INJECTION  09/02/2017   Procedure: SUBMUCOSAL INJECTION;  Surgeon: Wilford Corner, MD;  [facility];  Service: Endoscopy;;  epi injection   Family History  Problem Relation Age of Onset  . Hypertension Mother   . Kidney disease Mother   . Hypertension Father   . Stroke Sister   . Breast cancer Maternal Grandmother        died at 55  . Breast cancer Paternal Grandmother   . Breast cancer Cousin        pat first cousin dx in her 19s  . Hypertension Brother   . Cancer Maternal Aunt        unknown form  . Breast cancer Paternal Aunt   . Colon cancer Other 29       MGMs brother  . Breast cancer Paternal Aunt   . Cancer Other        breast ca in [place]  . Cancer Other        g uncle colon or stomach ca   Social History   Socioeconomic History  . Marital status: Single    Spouse name: Not on file  . Number of children: 1  . Years of education: Not on file  . Highest education level: Not on file  Occupational History  . Not on file  Social Needs  . Financial resource strain: Not on file  . Food insecurity:    Worry: Not on file    Inability: Not on file  . Transportation needs:    Medical: Not on file    Non-medical: Not on file  Tobacco Use  . Smoking status: Former Smoker    Packs/day: 0.33    Years: 10.00    Pack years: 3.30    Types: Cigarettes    Last attempt to quit: 10/04/2011    Years since quitting: 6.4  . Smokeless tobacco: Never Used  Substance and Sexual Activity  . Alcohol use: Yes    Alcohol/week: 0.0 standard drinks    Comment: social  . Drug use: No  . Sexual activity: Yes  Lifestyle  . Physical activity:    Days per week: Not on file    Minutes per session: Not on file  . Stress: Not on file  Relationships  . Social connections:    Talks on phone: Not on file    Gets together: Not on file     Attends religious service: Not on file    Active member of club or organization: Not on file    Attends meetings of clubs or organizations: Not on file    Relationship status: Not on file  . Intimate partner violence:    Fear of current or ex partner: Not on file    Emotionally abused: Not on file  Physically abused: Not on file    Forced sexual activity: Not on file  Other Topics Concern  . 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   Allergies  Allergen Reactions  . Ampicillin Other (See Comments)    Severe abdominal pain, dizziness (penicillin is okay)  . Amoxicillin Other (See Comments)    LIGHT-HEADED and CLOSE TO "PASSING OUT"  AND ABDOMINAL PAIN Has patient had a PCN reaction causing immediate rash, facial/tongue/throat swelling, SOB or lightheadedness with hypotension: No Has patient had a PCN reaction causing severe rash involving mucus membranes or skin necrosis: No Has patient had a PCN reaction that required hospitalization No Has patient had a PCN reaction occurring within the last 10 years: No If all of the above answers are "NO", then may proceed with Cephalosporin use.  . Sulfa Antibiotics Rash and Other (See Comments)    Very bad yeast infection   Recent Results (from the past 2160 hour(s))  Comprehensive metabolic panel     Status: Abnormal   Collection Time: 12/30/17  4:38 PM  Result Value Ref Range   Sodium 142 135 - 145 mEq/L   Potassium 5.0 3.5 - 5.1 mEq/L   Chloride 102 96 - 112 mEq/L   CO2 31 19 - 32 mEq/L   Glucose, Bld 89 70 - 99 mg/dL   BUN 22 6 - 23 mg/dL   Creatinine, Ser 1.17 0.40 - 1.20 mg/dL   Total Bilirubin 1.3 (H) 0.2 - 1.2 mg/dL    Alkaline Phosphatase 147 (H) 39 - 117 U/L   AST 21 0 - 37 U/L   ALT 14 0 - 35 U/L   Total Protein 7.4 6.0 - 8.3 g/dL   Albumin 3.6 3.5 - 5.2 g/dL   Calcium 9.5 8.4 - 10.5 mg/dL   GFR 62.50 >60.00 mL/min  Uric acid     Status: Abnormal   Collection Time: 12/30/17  4:38 PM  Result Value Ref Range   Uric Acid, Serum 10.1 (H) 2.4 - 7.0 mg/dL  CBC with Differential/Platelet     Status: Abnormal   Collection Time: 12/30/17  4:38 PM  Result Value Ref Range   WBC 4.9 4.0 - 10.5 K/uL   RBC 4.40 3.87 - 5.11 Mil/uL   Hemoglobin 14.9 12.0 - 15.0 g/dL   HCT 45.5 36.0 - 46.0 %   MCV 103.4 (H) 78.0 - 100.0 fl   MCHC 32.8 30.0 - 36.0 g/dL   RDW 14.8 11.5 - 15.5 %   Platelets 178.0 150.0 - 400.0 K/uL   Neutrophils Relative % 67.0 43.0 - 77.0 %   Lymphocytes Relative 18.7 12.0 - 46.0 %   Monocytes Relative 11.3 3.0 - 12.0 %   Eosinophils Relative 1.9 0.0 - 5.0 %   Basophils Relative 1.1 0.0 - 3.0 %   Neutro Abs 3.3 1.4 - 7.7 K/uL   Lymphs Abs 0.9 0.7 - 4.0 K/uL   Monocytes Absolute 0.6 0.1 - 1.0 K/uL   Eosinophils Absolute 0.1 0.0 - 0.7 K/uL   Basophils Absolute 0.1 0.0 - 0.1 K/uL  Vitamin D (25 hydroxy)     Status: Abnormal   Collection Time: 12/30/17  4:38 PM  Result Value Ref Range   VITD 5.39 (L) 30.00 - 100.00 ng/mL  Hemoglobin A1c     Status: None   Collection Time: 12/30/17  4:38 PM  Result Value Ref Range   Hgb A1c MFr Bld 6.2 4.6 - 6.5 %    Comment: Glycemic Control Guidelines for People with Diabetes:Non Diabetic:  <6%Goal of Therapy: <7%Additional Action Suggested:  >8%   NT-proBNP     Status: None   Collection Time: 12/30/17  4:38 PM  Result Value Ref Range   Pro B Natriuretic peptide (BNP) 4,807 pg/mL    Comment: . Unable to flag abnormal result(s), please refer     to reference range(s) below: Marland Kitchen Reference Range: . 50-75 years:        <300 pg/mL: Normal, heart failure unlikely.  > or = 900 pg/mL: High probability of heart failure. . . For patients with CHD, the  optimal risk category cut points for incident HF or CVD death (<253 pg/ml men, <372 pg/mL women) are based on Omland et al., JACC 2007; 50:205 . For patients with existing HF, the optimal risk category cut point for HF progression (<300 pg/mL) is based on Richarda Blade al., Clin Biochem. 2010; 43:1405 . HF diagnosis cut points in dyspneic patients are based on Januzzi et al., Eur Heart J 2006; 27:330 . For <50 years: <300 pg/mL HF unlikely, > or = 450 pg/mL high probability of HF; For 50-75 years: <300 pg/mL HF unlikely, > or = 900 pg/mL high probability of HF; For >75 years: <300 pg/mL HF unlikely, > or = 1800 pg/mL high probability of HF . For additional information, please refer to http://education.questdi [employer].com/faq/FAQ202 (This link is being provided for informational/educational purposes only.) .   Magnesium     Status: None   Collection Time: 12/30/17  4:51 PM  Result Value Ref Range   Magnesium 1.6 1.5 - 2.5 mg/dL   Objective  There is no height or weight on file to calculate BMI. Wt Readings from Last 3 Encounters:  12/30/17 233 lb (105.7 kg)  09/09/17 237 lb 6.4 oz (107.7 kg)  09/02/17 234 lb (106.1 kg)   Temp Readings from Last 3 Encounters:  12/30/17 97.8 F (36.6 C) (Oral)  09/09/17 97.6 F (36.4 C) (Oral)  09/02/17 (!) 97.3 F (36.3 C) (Oral)   BP Readings from Last 3 Encounters:  12/30/17 118/90  09/09/17 115/86  09/02/17 119/77   Pulse Readings from Last 3 Encounters:  12/30/17 87  09/09/17 66  09/02/17 70    Physical Exam  Constitutional: She is oriented to person, place, and time. Vital signs are normal. She appears well-developed and well-nourished. She is cooperative.  HENT:  Head: Normocephalic and atraumatic.  Mouth/Throat: Oropharynx is clear and moist and mucous membranes are normal.  Eyes: Pupils are equal, round, and reactive to light. Conjunctivae are normal.  Cardiovascular: Regular rhythm and normal heart sounds. Tachycardia  present.  Pulmonary/Chest: Effort normal and breath sounds normal.  Neurological: She is alert and oriented to person, place, and time. Gait normal.  Skin: Skin is warm, dry and intact.  Psychiatric: She has a normal mood and affect. Her speech is normal and behavior is normal. Judgment and thought content normal. Cognition and memory are normal.  Nursing note and vitals reviewed.   Assessment   1. H pylori  2.abdominal pain could be 2/2 umbilical and ventral hernias  3. Chronic resp failure with hypoxia could be 2/2 pulmonary HTN and noncompliant with  meds and O2 and CPAP. Also c/o chronic diastolic CHF due to weight gain vs URI  4. Gout  5. Leg cramps  6. HM Plan  1. tx pepto, doxy, prilosec, flagyl x 2 weeks  2. CT ab/pelvis with contrast h/o breast cancer r/o other I.e. Mets of breast cancer  3. Given 2L o2 today Disc with Dr. Harrietta Guardian about portable O2  rec compliance with O2 and cpap  Reduce spironolactone to 25 from 50 due to on telmisartan c/w hyperK  Refilled inhalers and f/u pulm  CXR today to w/u hypoxia  Note for work today  4. Increase allopurinol to 300 mg qd  5. Check BMET and mag today  Disc theraworx otc, hydration  6.  Flu shot had this month 12/30/17  MMR immune  Will rec twinrix in future with fatty liver  Tdap utd Consider shingrix in future and pna 23  Hep C negative   Pap smear  -s/p TAH(w/o cervix only ovaries intact)DUB 2/2 fibroids, adenomyosis  -follows with Dr. [facility] pap neg 6/2010last saw 01/2017 Dr. Benjie Karvonen -obtained records 04/14/17 visit  [facility] 06/28/2017 normalsee chart [facility]   Colonoscopy/EGD sch 6/6/19[facility] Dr. Schoolerdiverticulosis/hemorrhooids f/u in 10 years  Of note eye exam had 01/2017-rec schedule eye exam   She is former smoker from mid 62s age 89 to 54 3 cig per day   Provider: Dr. Olivia Mackie McLean-Scocuzza-Internal Medicine

## 2018-03-02 DIAGNOSIS — I272 Pulmonary hypertension, unspecified: Secondary | ICD-10-CM

## 2018-03-02 DIAGNOSIS — R0902 Hypoxemia: Secondary | ICD-10-CM

## 2018-03-02 DIAGNOSIS — J45909 Unspecified asthma, uncomplicated: Secondary | ICD-10-CM

## 2018-03-02 DIAGNOSIS — R0602 Shortness of breath: Secondary | ICD-10-CM

## 2018-03-09 MED ORDER — FLUTICASONE-SALMETEROL 250-50 MCG/DOSE IN AEPB: 1.0000 | INHALATION_SPRAY | Freq: Two times a day (BID) | RESPIRATORY_TRACT | 12 refills | Status: DC

## 2018-03-09 NOTE — Telephone Encounter (Signed)
Pt is requesting her advair diskus be sent to her pharmacy since it has not been sent yet.

## 2018-03-14 ENCOUNTER — Telehealth: Payer: Self-pay | Admitting: Internal Medicine

## 2018-03-14 ENCOUNTER — Encounter (HOSPITAL_COMMUNITY): Payer: Self-pay

## 2018-03-14 ENCOUNTER — Ambulatory Visit (HOSPITAL_COMMUNITY)
Admission: RE | Admit: 2018-03-14 | Discharge: 2018-03-14 | Disposition: A | Discharge status: Home / Self Care | Payer: BLUE CROSS/BLUE SHIELD | Source: Ambulatory Visit | Attending: Internal Medicine | Admitting: Internal Medicine

## 2018-03-14 ENCOUNTER — Encounter: Payer: Self-pay | Admitting: Internal Medicine

## 2018-03-14 DIAGNOSIS — R1033 Periumbilical pain: Secondary | ICD-10-CM | POA: Diagnosis not present

## 2018-03-14 DIAGNOSIS — K5730 Diverticulosis of large intestine without perforation or abscess without bleeding: Secondary | ICD-10-CM | POA: Diagnosis not present

## 2018-03-14 MED ORDER — IOHEXOL 300 MG/ML  SOLN
100.0000 mL | Freq: Once | INTRAMUSCULAR | Status: AC | PRN
  Administered 2018-03-14: 100 mL via INTRAVENOUS

## 2018-03-14 MED ORDER — SODIUM CHLORIDE (PF) 0.9 % IJ SOLN
INTRAMUSCULAR | Status: AC
  Filled 2018-03-14: qty 50

## 2018-03-14 NOTE — Telephone Encounter (Signed)
Copied from [place] [id number]. Topic: Quick Communication - See Telephone Encounter >> Mar 14, 2018  4:16 PM Blase Mess A wrote: CRM for notification. See Telephone encounter for: 03/14/18.  Patient is calling to Speak with Dr. Olivia Mackie McLean-Scocuzza.  Patient is wanting to know what is wrong with the heart, her liver?  No one has advise her why?  How fast does the Dr. Think that she needs to see the specialist? [name] advised patient to see specialist but does not know why the patient should see the specialist. Please advise [phone]

## 2018-03-21 ENCOUNTER — Ambulatory Visit: Payer: BLUE CROSS/BLUE SHIELD | Admitting: Internal Medicine

## 2018-03-21 ENCOUNTER — Encounter: Payer: Self-pay | Admitting: Internal Medicine

## 2018-03-21 VITALS — BP 110/80 | HR 111 | Temp 98.1°F | Ht 65.0 in | Wt 248.6 lb

## 2018-03-21 DIAGNOSIS — R0902 Hypoxemia: Secondary | ICD-10-CM | POA: Diagnosis not present

## 2018-03-21 DIAGNOSIS — I272 Pulmonary hypertension, unspecified: Secondary | ICD-10-CM

## 2018-03-21 DIAGNOSIS — A048 Other specified bacterial intestinal infections: Secondary | ICD-10-CM

## 2018-03-21 DIAGNOSIS — K7460 Unspecified cirrhosis of liver: Secondary | ICD-10-CM | POA: Diagnosis not present

## 2018-03-21 DIAGNOSIS — M109 Gout, unspecified: Secondary | ICD-10-CM | POA: Diagnosis not present

## 2018-03-21 DIAGNOSIS — R600 Localized edema: Secondary | ICD-10-CM

## 2018-03-21 DIAGNOSIS — J45909 Unspecified asthma, uncomplicated: Secondary | ICD-10-CM | POA: Diagnosis not present

## 2018-03-21 DIAGNOSIS — G4733 Obstructive sleep apnea (adult) (pediatric): Secondary | ICD-10-CM

## 2018-03-21 DIAGNOSIS — Z23 Encounter for immunization: Secondary | ICD-10-CM

## 2018-03-21 DIAGNOSIS — R0602 Shortness of breath: Secondary | ICD-10-CM | POA: Diagnosis not present

## 2018-03-21 DIAGNOSIS — R188 Other ascites: Secondary | ICD-10-CM | POA: Diagnosis not present

## 2018-03-21 DIAGNOSIS — I50813 Acute on chronic right heart failure: Secondary | ICD-10-CM

## 2018-03-21 DIAGNOSIS — K760 Fatty (change of) liver, not elsewhere classified: Secondary | ICD-10-CM

## 2018-03-21 LAB — TIQ-NTM

## 2018-03-21 MED ORDER — FLAGYL 500 MG PO TABS: 500.0000 mg | ORAL_TABLET | Freq: Two times a day (BID) | ORAL | 0 refills | Status: DC

## 2018-03-21 MED ORDER — FLUTICASONE-SALMETEROL 250-50 MCG/DOSE IN AEPB: 1.0000 | INHALATION_SPRAY | Freq: Two times a day (BID) | RESPIRATORY_TRACT | 12 refills | Status: DC

## 2018-03-21 NOTE — Patient Instructions (Addendum)
Cirrhosis  Cirrhosis is long-term (chronic) liver injury. The liver is the body's largest internal organ, and it performs many functions. It converts food into energy, removes toxic material from the blood, makes important proteins, and absorbs necessary vitamins from food. In cirrhosis, healthy liver cells are replaced by scar tissue. This prevents blood from flowing through the liver, making it difficult for the liver to function. Scarring of the liver cannot be reversed, but treatment can prevent it from getting worse. What are the causes? Common causes of this condition are hepatitis C and long-term alcohol abuse. Other causes include:  Nonalcoholic fatty liver disease. This happens when fat is deposited in the liver by causes other than alcohol.  Hepatitis B infection.  Autoimmune hepatitis. In this condition, the body's defense system (immune system) mistakenly attacks the liver cells, causing irritation and swelling (inflammation).  Diseases that cause blockage of ducts inside the liver.  Inherited liver diseases, such as hemochromatosis. This is one of the most common inherited liver diseases. In this disease, deposits of iron collect in the liver and other organs.  Reactions to certain long-term medicines, such as amiodarone, a heart medicine.  Parasitic infections. These include schistosomiasis, which is caused by a flatworm.  Long-term contact to certain toxins. These toxins include certain organic solvents, such as toluene and chloroform. What increases the risk? You are more likely to develop this condition if:  You have certain types of viral hepatitis.  You abuse alcohol, especially if you are female.  You are overweight.  You share needles.  You have unprotected sex with someone who has viral hepatitis. What are the signs or symptoms? You may not have any signs and symptoms at first. Symptoms may not develop until the damage to your liver starts to get  worse. Early symptoms may include:  Weakness and tiredness (fatigue).  Changes in sleep patterns or having trouble sleeping.  Itchiness.  Tenderness in the right-upper part of your abdomen.  Weight loss and muscle loss.  Nausea.  Loss of appetite.  Appearance of tiny blood vessels under the skin. Later symptoms may include:  Fatigue or weakness that is getting worse.  Yellow skin and eyes (jaundice).  Buildup of fluid in the abdomen (ascites). You may notice that your clothes are tight around your waist.  Weight gain.  Swelling of the feet and ankles (edema).  Trouble breathing.  Easy bruising and bleeding.  Vomiting blood.  Black or bloody stool.  Mental confusion. How is this diagnosed? Your health care provider may suspect cirrhosis based on your symptoms and medical history, especially if you have other medical conditions or a history of alcohol abuse. Your health care provider will do a physical exam to feel your liver and to check for signs of cirrhosis. He or she may perform other tests, including:  Blood tests to check: ? For hepatitis B or C. ? Kidney function. ? Liver function.  Imaging tests such as: ? MRI or CT scan to look for changes seen in advanced cirrhosis. ? Ultrasound to see if normal liver tissue is being replaced by scar tissue.  A procedure in which a long needle is used to take a sample of liver tissue to be checked in a lab (biopsy). Liver biopsy can confirm the diagnosis of cirrhosis. How is this treated? Treatment for this condition depends on how damaged your liver is and what caused the damage. It may include treating the symptoms of cirrhosis, or treating the underlying causes in order to  slow the damage. Treatment may include:  Making lifestyle changes, such as: ? Eating a healthy diet. You may need to work with your health care provider or a diet and nutrition specialist (dietitian) to develop an eating plan. ? Restricting salt  intake. ? Maintaining a healthy weight. ? Not abusing drugs or alcohol.  Taking medicines to: ? Treat liver infections or other infections. ? Control itching. ? Reduce fluid buildup. ? Reduce certain blood toxins. ? Reduce risk of bleeding from enlarged blood vessels in the stomach or esophagus (varices).  Liver transplant. In this procedure, a liver from a donor is used to replace your diseased liver. This is done if cirrhosis has caused liver failure. Other treatments and procedures may be done depending on the problems that you get from cirrhosis. Common problems include liver-related kidney failure (hepatorenal syndrome). Follow these instructions at home:   Take medicines only as told by your health care provider. Do not use medicines that are toxic to your liver. Ask your health care provider before taking any new medicines, including over-the-counter medicines.  Rest as needed.  Eat a well-balanced diet. Ask your health care provider or dietitian for more information.  Limit your salt or water intake, if your health care provider asks you to do this.  Do not drink alcohol. This is especially important if you are taking acetaminophen.  Keep all follow-up visits as told by your health care provider. This is important. Contact a health care provider if you:  Have fatigue or weakness that is getting worse.  Develop swelling of the hands, feet, legs, or face.  Have a fever.  Develop loss of appetite.  Have nausea or vomiting.  Develop jaundice.  Develop easy bruising or bleeding. Get help right away if you:  Vomit bright red blood or a material that looks like coffee grounds.  Have blood in your stools.  Notice that your stools appear black and tarry.  Become confused.  Have chest pain or trouble breathing. Summary  Cirrhosis is chronic liver injury. Liver damage cannot be reversed. Common causes are hepatitis C and long-term alcohol abuse.  Tests used to  diagnose cirrhosis include blood tests, imaging tests, and liver biopsy.  Treatment for this condition involves treating the underlying cause. Avoid alcohol, drugs, salt, and medicines that may damage your liver.  Contact your health care provider if you develop ascites, edema, jaundice, fever, nausea or vomiting, easy bruising or bleeding, or worsening fatigue. This information is not intended to replace advice given to you by your health care provider. Make sure you discuss any questions you have with your health care provider. Document Released: 03/16/2005 Document Revised: 02/03/2017 Document Reviewed: 02/03/2017 Elsevier Interactive Patient Education  2019 [place].  Echo 06/18/17  Study Conclusions  - Left ventricle: The cavity size was below normal. Wall thickness   was normal. Systolic function was normal. The estimated ejection   fraction was in the range of 60% to 65%. Although no diagnostic   regional wall motion abnormality was identified, this possibility   cannot be completely excluded on the basis of this study. Doppler   parameters are consistent with abnormal left ventricular   relaxation (grade 1 diastolic dysfunction). - Ventricular septum: The contour showed systolic flattening. These   changes are consistent with RV pressure overload. - Right ventricle: The cavity size was severely dilated. Wall   thickness was increased. Systolic function was moderately   reduced. - Right atrium: The atrium was severely dilated. - Atrial  septum: The septum bowed from right to left, consistent   with increased right atrial pressure. No defect or patent foramen   ovale was identified.   CT ABDOMEN AND PELVIS WITH CONTRAST  TECHNIQUE: Multidetector CT imaging of the abdomen and pelvis was performed using the standard protocol following bolus administration of intravenous contrast.  CONTRAST:  177m OMNIPAQUE IOHEXOL 300 MG/ML  SOLN  COMPARISON:   01/12/2014  FINDINGS: Lower Chest: Increased right atrial enlargement since prior exam.  Hepatobiliary: Progressive hepatic cirrhosis is seen since previous study. Reflux of contrast into the IVC and hepatic veins is consistent with right heart insufficiency. No liver mass identified. Gallbladder is unremarkable.  Pancreas:  No mass or inflammatory changes.  Spleen: Within normal limits in size and appearance.  Adrenals/Urinary Tract: No masses identified. No evidence of hydronephrosis.  Stomach/Bowel: No evidence of obstruction, inflammatory process or abnormal fluid collections. Mild diverticulosis of descending and proximal sigmoid colon is seen, without evidence of diverticulitis.  Vascular/Lymphatic: No pathologically enlarged lymph nodes. No abdominal aortic aneurysm.  Reproductive: Prior hysterectomy noted. Adnexal regions are unremarkable in appearance.  Other: Diffuse body wall and mesenteric edema noted, with tiny amount of ascites.  Musculoskeletal:  No suspicious bone lesions identified.  IMPRESSION: Progressive hepatic cirrhosis since previous study. No evidence of hepatic neoplasm.  Diffuse body wall and mesenteric edema, and minimal ascites.  Increased right atrial enlargement, and signs of right heart insufficiency.  Colonic diverticulosis, without radiographic evidence of diverticulitis.

## 2018-03-21 NOTE — Telephone Encounter (Signed)
We can discuss at her appt today but inform today   She likely has heart issues making her have liver cirrhosis  I rec we set her up with cardiology at [facility] or in [facility] heart failure clinic to further work up  She needs to make sure she uses CPAP as OSA, pulmonary HTN can cause heart issues  Specialists referrals are not urgent  GI will need to f/u as well

## 2018-03-21 NOTE — Progress Notes (Addendum)
Chief Complaint  Patient presents with  . 2 Week Follow-up   F/u CT scan results  1. CT +liver cirrhosis with ascites, right heart insufficiency and right atrial enlargement and diverticulosis  2. Leg edema b/l on torsemide 20 mg qd prnand spironolactone 25 mg qd she does have h/o DVT offered to do US doppler today pt wants D dimer instead 3. H pylori she did not take flagyl but took doxycycline due to size and taste of flagyl encouraged her to take flagyl  4. RAD, pulm HTN-needs refill of albuterol inhaler. She does c/o sob with exertion, chronic cough. Her O2 went to 82-88 with exertion and she has her portable O2 in the car and not wearing though she has been encouraged to wear it over the last 3 years. CXR 03/01/18 cardiomegaly and pulm venous congestion  5. H/o fatty liver and needs for vaccine hep A and B.  6. Gout improved on allopurinol 300 mg qd   Review of Systems  Constitutional: Negative for weight loss.  HENT: Negative for hearing loss.   Eyes: Negative for blurred vision.  Respiratory: Positive for cough and shortness of breath.   Cardiovascular: Positive for leg swelling.  Gastrointestinal: Negative for abdominal pain.  Musculoskeletal: Negative for falls.  Skin: Negative for rash.  Neurological: Negative for headaches.  Psychiatric/Behavioral: Negative for depression.   Past Medical History:  Diagnosis Date  . Anemia    iron deficiency  . Arthritis   . Borderline diabetes   . Complication of anesthesia    woke up slowly- after hysterectomy- 2015  . Diabetes mellitus, type II ([place])   . DVT (deep venous thrombosis) ([place]) 2014   left leg  . Family history of breast cancer   . Gout   . HTN (hypertension)   . 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 Menorrhagia    secondary to uterine fibroids  . OSA (obstructive sleep apnea)    07/25/13 HST AHI 33/hr, severe hypoxemia O2 min 42% and 95% of the time <89%  . PE (pulmonary  embolism) 2014   bilateral  . Pulmonary artery hypertension ([place])   . Pulmonary nodule    (64m on loeft lower lobe) found on CT scan July 2014, repeat scan Jan 2015 showed less than 470m . Right ovarian cyst    noted 09/2012   . S/P TAH (total abdominal hysterectomy) 06/07/2013  . Trichomoniasis    05/2011    Past Surgical History:  Procedure Laterality Date  . ABDOMINAL HYSTERECTOMY N/A 06/07/2013   Procedure: HYSTERECTOMY ABDOMINAL WITH BIALTERAL SALPINGECTOMY;  Surgeon: VaElveria RoyalsMD;  [facility];  Service: Gynecology;  Laterality: N/A;  . BIOPSY  09/02/2017   Procedure: BIOPSY;  Surgeon: ScWilford CornerMD;  [facility];  Service: Endoscopy;;  . BREAST LUMPECTOMY Left 07/27/2016   BREAST LUMPECTOMY WITH RADIOACTIVE SEED AND SENTINEL LYMPH NODE BIOPSY (Left)  . BREAST LUMPECTOMY WITH RADIOACTIVE SEED AND SENTINEL LYMPH NODE BIOPSY Left 07/27/2016   Procedure: BREAST LUMPECTOMY WITH RADIOACTIVE SEED AND SENTINEL LYMPH NODE BIOPSY;  Surgeon: FaStark KleinMD;  [facility] Service: General;  Laterality: Left;  . CARDIAC CATHETERIZATION N/A 12/24/2015   Procedure: Right Heart Cath;  Surgeon: JaAdrian ProwsMD;  [facility];  Service: Cardiovascular;  Laterality: N/A;  . CESAREAN SECTION    . COLONOSCOPY    . COLONOSCOPY WITH PROPOFOL N/A 09/02/2017   Procedure: COLONOSCOPY WITH PROPOFOL;  Surgeon:  Wilford Corner, MD;  [facility];  Service: Endoscopy;  Laterality: N/A;  . ESOPHAGOGASTRODUODENOSCOPY (EGD) WITH PROPOFOL N/A 09/02/2017   Procedure: ESOPHAGOGASTRODUODENOSCOPY (EGD) WITH PROPOFOL;  Surgeon: Wilford Corner, MD;  [facility];  Service: Endoscopy;  Laterality: N/A;  . IR CV LINE INJECTION  07/27/2017  . POLYPECTOMY  2008   Removal of uterine polyp  . PORT-A-CATH REMOVAL Right 09/09/2017   Procedure: REMOVAL PORT-A-CATH;  Surgeon: Stark Klein, MD;  Location: [place];  Service: General;  Laterality:  Right;  . PORTA CATH INSERTION  07/27/2016  . PORTACATH PLACEMENT Right 07/27/2016   Procedure: INSERTION PORT-A-CATH;  Surgeon: Stark Klein, MD;  Location: [place];  Service: General;  Laterality: Right;  . SUBMUCOSAL INJECTION  09/02/2017   Procedure: SUBMUCOSAL INJECTION;  Surgeon: Wilford Corner, MD;  [facility];  Service: Endoscopy;;  epi injection   Family History  Problem Relation Age of Onset  . Hypertension Mother   . Kidney disease Mother   . Hypertension Father   . Stroke Sister   . Breast cancer Maternal Grandmother        died at 50  . Breast cancer Paternal Grandmother   . Breast cancer Cousin        pat first cousin dx in her 69s  . Hypertension Brother   . Cancer Maternal Aunt        unknown form  . Breast cancer Paternal Aunt   . Colon cancer Other 60       MGMs brother  . Breast cancer Paternal Aunt   . Cancer Other        breast ca in [place]  . Cancer Other        g uncle colon or stomach ca   Social History   Socioeconomic History  . Marital status: Single    Spouse name: Not on file  . Number of children: 1  . Years of education: Not on file  . Highest education level: Not on file  Occupational History  . Not on file  Social Needs  . Financial resource strain: Not on file  . Food insecurity:    Worry: Not on file    Inability: Not on file  . Transportation needs:    Medical: Not on file    Non-medical: Not on file  Tobacco Use  . Smoking status: Former Smoker    Packs/day: 0.33    Years: 10.00    Pack years: 3.30    Types: Cigarettes    Last attempt to quit: 10/04/2011    Years since quitting: 6.4  . Smokeless tobacco: Never Used  Substance and Sexual Activity  . Alcohol use: Yes    Alcohol/week: 0.0 standard drinks    Comment: social  . Drug use: No  . Sexual activity: Yes  Lifestyle  . Physical activity:    Days per week: Not on file    Minutes per session: Not on file  . Stress: Not on file  Relationships  . Social  connections:    Talks on phone: Not on file    Gets together: Not on file    Attends religious service: Not on file    Active member of club or organization: Not on file    Attends meetings of clubs or organizations: Not on file    Relationship status: Not on file  . Intimate partner violence:    Fear of current or ex partner: Not on file    Emotionally abused:  Not on file    Physically abused: Not on file    Forced sexual activity: Not on file  Other Topics Concern  . 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 acetaminophen (TYLENOL) 500 MG tablet Take 1,000 mg by mouth every 6 (six) hours as needed for moderate pain or headache.   . albuterol (PROVENTIL HFA;VENTOLIN HFA) 108 (90 Base) MCG/ACT inhaler Inhale 2 puffs into the lungs every 6 (six) hours as needed for wheezing or shortness of breath.  . allopurinol (ZYLOPRIM) 300 MG tablet Take 1 tablet (300 mg total) by mouth daily.  Marland Kitchen anastrozole (ARIMIDEX) 1 MG tablet Take 1 mg by mouth daily.  Marland Kitchen bismuth subsalicylate (PEPTO-BISMOL) 262 MG/15ML suspension Take 30 mLs by mouth 4 (four) times daily -  before meals and at bedtime. X 2 weeks  . cetirizine (ZYRTEC) 10 MG tablet Take 10 mg by mouth daily.  . chlorpheniramine-HYDROcodone (TUSSIONEX PENNKINETIC ER) 10-8 MG/5ML SUER Take 5 mLs by mouth at bedtime as needed for cough.  . Cholecalciferol 50000 units capsule Take 1 capsule (50,000 Units total) by mouth once a week.  . colchicine 0.6 MG tablet Take 1 tablet (0.6 mg total) by mouth daily. May repeat 1 more dose as needed for gout flare  . doxycycline (VIBRA-TABS) 100 MG tablet Take 1 tablet (100 mg total) by mouth 2 (two) times daily.  . fluticasone (FLONASE) 50 MCG/ACT nasal spray Place 2 sprays into both nostrils daily.   .  Fluticasone-Salmeterol (ADVAIR DISKUS) 250-50 MCG/DOSE AEPB Inhale 1 puff into the lungs 2 (two) times daily. Rinse mouth  . metFORMIN (GLUCOPHAGE) 500 MG tablet Take 1 tablet (500 mg total) by mouth 2 (two) times daily with a meal.  . metoprolol tartrate (LOPRESSOR) 25 MG tablet Take 1 tablet (25 mg total) by mouth 2 (two) times daily. (Patient taking differently: Take 25 mg by mouth daily. )  . metroNIDAZOLE (FLAGYL) 500 MG tablet Take 1 tablet (500 mg total) by mouth 2 (two) times daily. With food  . Multiple Vitamin (MULTIVITAMIN) tablet Take 1 tablet by mouth daily.  Marland Kitchen omeprazole (PRILOSEC) 40 MG capsule Take 1 capsule (40 mg total) by mouth daily. 30 minutes before dinner  . OPSUMIT 10 MG TABS Take 10 mg by mouth daily.  Marland Kitchen oxyCODONE (OXY IR/ROXICODONE) 5 MG immediate release tablet Take 1 tablet (5 mg total) by mouth every 6 (six) hours as needed for severe pain.  Vladimir Faster Glycol-Propyl Glycol (SYSTANE OP) Place 1 drop into both eyes daily as needed (dry eyes).   . potassium chloride (K-DUR) 10 MEQ tablet Take 1 tablet (10 mEq total) by mouth daily.  . predniSONE (DELTASONE) 20 MG tablet Take 1 tablet (20 mg total) by mouth daily with breakfast.  . sildenafil (REVATIO) 20 MG tablet Take 40 mg by mouth 3 (three) times daily.   Marland Kitchen spironolactone (ALDACTONE) 25 MG tablet Take 1 tablet (25 mg total) by mouth daily.  Marland Kitchen telmisartan (MICARDIS) 40 MG tablet Take 1 tablet (40 mg total) by mouth daily.  Marland Kitchen torsemide (DEMADEX) 20 MG tablet Take 20 mg by mouth daily as needed.   . triamcinolone cream (KENALOG) 0.1 % Apply 1 application topically daily as needed (leg discoloration).  Marland Kitchen  UPTRAVI 400 MCG TABS Take 400 mcg by mouth 2 (two) times daily.    Allergies  Allergen Reactions  . Ampicillin Other (See Comments)    Severe abdominal pain, dizziness (penicillin is okay)  . Amoxicillin Other (See Comments)    LIGHT-HEADED and CLOSE TO "PASSING OUT"  AND ABDOMINAL PAIN Has patient had a PCN reaction  causing immediate rash, facial/tongue/throat swelling, SOB or lightheadedness with hypotension: No Has patient had a PCN reaction causing severe rash involving mucus membranes or skin necrosis: No Has patient had a PCN reaction that required hospitalization No Has patient had a PCN reaction occurring within the last 10 years: No If all of the above answers are "NO", then may proceed with Cephalosporin use.  . Sulfa Antibiotics Rash and Other (See Comments)    Very bad yeast infection   Recent Results (from the past 2160 hour(s))  Comprehensive metabolic panel     Status: Abnormal   Collection Time: 12/30/17  4:38 PM  Result Value Ref Range   Sodium 142 135 - 145 mEq/L   Potassium 5.0 3.5 - 5.1 mEq/L   Chloride 102 96 - 112 mEq/L   CO2 31 19 - 32 mEq/L   Glucose, Bld 89 70 - 99 mg/dL   BUN 22 6 - 23 mg/dL   Creatinine, Ser 1.17 0.40 - 1.20 mg/dL   Total Bilirubin 1.3 (H) 0.2 - 1.2 mg/dL   Alkaline Phosphatase 147 (H) 39 - 117 U/L   AST 21 0 - 37 U/L   ALT 14 0 - 35 U/L   Total Protein 7.4 6.0 - 8.3 g/dL   Albumin 3.6 3.5 - 5.2 g/dL   Calcium 9.5 8.4 - 10.5 mg/dL   GFR 62.50 >60.00 mL/min  Uric acid     Status: Abnormal   Collection Time: 12/30/17  4:38 PM  Result Value Ref Range   Uric Acid, Serum 10.1 (H) 2.4 - 7.0 mg/dL  CBC with Differential/Platelet     Status: Abnormal   Collection Time: 12/30/17  4:38 PM  Result Value Ref Range   WBC 4.9 4.0 - 10.5 K/uL   RBC 4.40 3.87 - 5.11 Mil/uL   Hemoglobin 14.9 12.0 - 15.0 g/dL   HCT 45.5 36.0 - 46.0 %   MCV 103.4 (H) 78.0 - 100.0 fl   MCHC 32.8 30.0 - 36.0 g/dL   RDW 14.8 11.5 - 15.5 %   Platelets 178.0 150.0 - 400.0 K/uL   Neutrophils Relative % 67.0 43.0 - 77.0 %   Lymphocytes Relative 18.7 12.0 - 46.0 %   Monocytes Relative 11.3 3.0 - 12.0 %   Eosinophils Relative 1.9 0.0 - 5.0 %   Basophils Relative 1.1 0.0 - 3.0 %   Neutro Abs 3.3 1.4 - 7.7 K/uL   Lymphs Abs 0.9 0.7 - 4.0 K/uL   Monocytes Absolute 0.6 0.1 - 1.0 K/uL    Eosinophils Absolute 0.1 0.0 - 0.7 K/uL   Basophils Absolute 0.1 0.0 - 0.1 K/uL  Vitamin D (25 hydroxy)     Status: Abnormal   Collection Time: 12/30/17  4:38 PM  Result Value Ref Range   VITD 5.39 (L) 30.00 - 100.00 ng/mL  Hemoglobin A1c     Status: None   Collection Time: 12/30/17  4:38 PM  Result Value Ref Range   Hgb A1c MFr Bld 6.2 4.6 - 6.5 %    Comment: Glycemic Control Guidelines for People with Diabetes:Non Diabetic:  <6%Goal of Therapy: <7%Additional Action Suggested:  >8%  NT-proBNP     Status: None   Collection Time: 12/30/17  4:38 PM  Result Value Ref Range   Pro B Natriuretic peptide (BNP) 4,807  pg/mL    Comment: . Unable to flag abnormal result(s), please refer     to reference range(s) below: Marland Kitchen Reference Range: . 50-75 years:        <300 pg/mL: Normal, heart failure unlikely.  > or = 900 pg/mL: High probability of heart failure. . . For patients with CHD, the optimal risk category cut points for incident HF or CVD death (<253 pg/ml men, <372 pg/mL women) are based on Omland et al., JACC 2007; 50:205 . For patients with existing HF, the optimal risk category cut point for HF progression (<300 pg/mL) is based on Richarda Blade al., Clin Biochem. 2010; 43:1405 . HF diagnosis cut points in dyspneic patients are based on Januzzi et al., Eur Heart J 2006; 27:330 . For <50 years: <300 pg/mL HF unlikely, > or = 450 pg/mL high probability of HF; For 50-75 years: <300 pg/mL HF unlikely, > or = 900 pg/mL high probability of HF; For >75 years: <300 pg/mL HF unlikely, > or = 1800 pg/mL high probability of HF . For additional information, please refer to http://education.quest[employer].com/faq/FAQ202 (This link is being provided for informational/educational purposes only.) .   Magnesium     Status: None   Collection Time: 12/30/17  4:51 PM  Result Value Ref Range   Magnesium 1.6 1.5 - 2.5 mg/dL  Basic Metabolic Panel (BMET)     Status: Abnormal    Collection Time: 03/01/18 12:11 PM  Result Value Ref Range   Sodium 140 135 - 145 mEq/L   Potassium 3.6 3.5 - 5.1 mEq/L   Chloride 102 96 - 112 mEq/L   CO2 31 19 - 32 mEq/L   Glucose, Bld 125 (H) 70 - 99 mg/dL   BUN 18 6 - 23 mg/dL   Creatinine, Ser 0.94 0.40 - 1.20 mg/dL   Calcium 8.7 8.4 - 10.5 mg/dL   GFR 80.41 >60.00 mL/min  Magnesium     Status: Abnormal   Collection Time: 03/01/18 12:11 PM  Result Value Ref Range   Magnesium 1.2 (L) 1.5 - 2.5 mg/dL   Objective  Body mass index is 41.37 kg/m. Wt Readings from Last 3 Encounters:  03/21/18 248 lb 9.6 oz (112.8 kg)  03/01/18 244 lb (110.7 kg)  12/30/17 233 lb (105.7 kg)   Temp Readings from Last 3 Encounters:  03/21/18 98.1 F (36.7 C) (Oral)  03/01/18 97.7 F (36.5 C) (Oral)  12/30/17 97.8 F (36.6 C) (Oral)   BP Readings from Last 3 Encounters:  03/21/18 110/80  03/01/18 126/84  12/30/17 118/90   Pulse Readings from Last 3 Encounters:  03/21/18 (!) 111  03/01/18 (!) 113  12/30/17 87    Physical Exam Vitals signs and nursing note reviewed.  Constitutional:      Appearance: Normal appearance. She is well-developed. She is obese.  HENT:     Head: Normocephalic and atraumatic.     Mouth/Throat:     Mouth: Mucous membranes are moist.     Pharynx: Oropharynx is clear.  Eyes:     Conjunctiva/sclera: Conjunctivae normal.     Pupils: Pupils are equal, round, and reactive to light.  Cardiovascular:     Rate and Rhythm: Regular rhythm. Tachycardia present.     Heart sounds: No murmur.     Comments: 2+ leg edema to thighs   Pulmonary:  Effort: Pulmonary effort is normal.     Breath sounds: Normal breath sounds.  Skin:    General: Skin is warm and dry.  Neurological:     General: No focal deficit present.     Mental Status: She is alert and oriented to person, place, and time. Mental status is at baseline.     Gait: Gait normal.  Psychiatric:        Attention and Perception: Attention and perception  normal.        Mood and Affect: Mood and affect normal.        Speech: Speech normal.        Behavior: Behavior normal. Behavior is cooperative.        Thought Content: Thought content normal.        Cognition and Memory: Cognition and memory normal.        Judgment: Judgment normal.     Assessment   1. Liver cirrhosis with mild ascites likely 2/2 heart failure (right sided) r/o AIH 2. pulm HTN with hypoxia 82-88% today NOT consistently using portable O2 though she has been encouraged several times over the last 3 years, RAD, OSA not using CPAP. With SOB 3. Leg edema r/o DVT h/o DVT 4. H pylori 5. Fatty liver  6. Gout  7. HM  Plan   1. Refer to Dr. Sung Amabile [place] likely needs repeat echo Try to do AMA, anti liver kidney Abs, antismooth muscle ab, ANA to r/o autoimmune etiology 2. [facility] pulmonary Dr. Harrietta Guardian fax note no f/u yet 3. D dimer rec [place] b/l legs to prefers D dimer for now  She does have h/o DVT  4. Encouraged to take flagyl she did take doxycycline  5. Given hep A and new hep B vaccine today  6. Uric acid trending down on allopurinol 300 mg qd  7.  Flu shot had this month10/3/19  MMR immune  Hep A vx given today will need repeat in 6-12 months  Hep B new vaccine given today  Tdap utd Consider shingrix in future and pna 23  Hep C negative11/29/17   Pap smear  -s/p TAH(w/o cervix only ovaries intact)DUB 2/2 fibroids, adenomyosis  -follows with Dr. [facility] pap neg 6/2010last saw 01/2017 Dr. Benjie Karvonen -obtained records 04/14/17 visit  [facility] 06/28/2017 normalsee chart [facility]  Colonoscopy/EGD sch 6/6/19[facility] Dr. Schoolerdiverticulosis/hemorrhooids f/u in 10 years  Of note eye exam had 01/2017-rec schedule eye exam   She is former smoker from mid 33s age 36 to 33 3 cig per day  [facility] pulm saw 05/19/18 pulm HTN WHO group 1 RHC in 2 weeks iv remodulin cont sildenafil 20 mg tid , macitentan 10 mg qd, Selexipag 400 mcg bid torsemide 20 mg qod for  now and spironolactone 50 mg qd cpap/o2 qhs , pulm rehab in future liver changes I.e cirrhosis 2/2 RV failure so will repeat RHC rec ibuprofen 800 mg caution use 1x per week rec if gout continues to flare increase allopurinol  Dr. Lacinda Axon cath lab 06/02/2018 [place] [facility], worsening HD status on max oral therapy selexipag, sildenafil, macitentan will rec iv remodulin   Provider: Dr. Olivia Mackie McLean-Scocuzza-Internal Medicine

## 2018-03-21 NOTE — Telephone Encounter (Signed)
Left message for patient to return call back if she does not hear from appointment today. PEC may give information.

## 2018-03-22 ENCOUNTER — Other Ambulatory Visit: Payer: Self-pay | Admitting: Internal Medicine

## 2018-03-22 ENCOUNTER — Encounter: Payer: Self-pay | Admitting: *Deleted

## 2018-03-22 ENCOUNTER — Telehealth: Payer: Self-pay | Admitting: *Deleted

## 2018-03-22 ENCOUNTER — Encounter: Payer: Self-pay | Admitting: Internal Medicine

## 2018-03-22 ENCOUNTER — Other Ambulatory Visit (HOSPITAL_COMMUNITY)
Admission: RE | Admit: 2018-03-22 | Discharge: 2018-03-22 | Disposition: A | Discharge status: Home / Self Care | Payer: BLUE CROSS/BLUE SHIELD | Source: Ambulatory Visit | Attending: Internal Medicine | Admitting: Internal Medicine

## 2018-03-22 DIAGNOSIS — R188 Other ascites: Secondary | ICD-10-CM | POA: Diagnosis not present

## 2018-03-22 DIAGNOSIS — K7460 Unspecified cirrhosis of liver: Secondary | ICD-10-CM | POA: Diagnosis not present

## 2018-03-22 DIAGNOSIS — R600 Localized edema: Secondary | ICD-10-CM | POA: Insufficient documentation

## 2018-03-22 DIAGNOSIS — R7989 Other specified abnormal findings of blood chemistry: Secondary | ICD-10-CM

## 2018-03-22 LAB — D-DIMER, QUANTITATIVE (NOT AT ARMC): D DIMER QUANT: 3.93 ug{FEU}/mL — AB (ref 0.00–0.50)

## 2018-03-22 NOTE — Addendum Note (Signed)
Addended by: Orland Mustard on: 03/22/2018 12:30 PM   Modules accepted: Orders

## 2018-03-22 NOTE — Telephone Encounter (Signed)
Lab order placed for pt to have D-dimer redrawn

## 2018-03-24 ENCOUNTER — Telehealth: Payer: Self-pay

## 2018-03-24 ENCOUNTER — Ambulatory Visit
Admission: RE | Admit: 2018-03-24 | Discharge: 2018-03-24 | Disposition: A | Discharge status: Home / Self Care | Payer: BLUE CROSS/BLUE SHIELD | Source: Ambulatory Visit | Attending: Internal Medicine | Admitting: Internal Medicine

## 2018-03-24 DIAGNOSIS — R609 Edema, unspecified: Secondary | ICD-10-CM | POA: Diagnosis not present

## 2018-03-24 DIAGNOSIS — R7989 Other specified abnormal findings of blood chemistry: Secondary | ICD-10-CM | POA: Diagnosis not present

## 2018-03-24 DIAGNOSIS — R600 Localized edema: Secondary | ICD-10-CM | POA: Diagnosis not present

## 2018-03-24 LAB — MITOCHONDRIAL ANTIBODIES: Mitochondrial M2 Ab, IgG: <20 Units (ref 0.0–20.0)

## 2018-03-24 LAB — ANTI-MICROSOMAL ANTIBODY LIVER / KIDNEY: LKM1 Ab: 1.1 Units (ref 0.0–20.0)

## 2018-03-24 LAB — ANTI-SMOOTH MUSCLE ANTIBODY, IGG: F-Actin IgG: 9 Units (ref 0–19)

## 2018-03-24 LAB — ANA: Anti Nuclear Antibody(ANA): NEGATIVE

## 2018-03-24 NOTE — Telephone Encounter (Signed)
Copied from [place] [id number]. Topic: General - Other >> Mar 24, 2018  9:32 AM Carolyn Stare wrote:  [name] call to say the below med is to expensive and is asking if something else can be called in she can not take the generic   FLAGYL 500 MG tablet

## 2018-03-24 NOTE — Progress Notes (Signed)
yes 

## 2018-03-24 NOTE — Telephone Encounter (Signed)
Does she want nausea medication to take with generic flagyl?  Can she mix in with applesauce or yogurt   [place]

## 2018-03-24 NOTE — Telephone Encounter (Signed)
There is nothing else other than generic   TMS

## 2018-03-24 NOTE — Telephone Encounter (Signed)
Its too much and the flagyl makes patient sick to stomach

## 2018-03-24 NOTE — Telephone Encounter (Signed)
Pt. Returned call.  Is requesting to have the Ultrasound of bilat. LE's scheduled in [place], instead of [place].  Stated because she is at work, and it will take her one hour to get to [place], she prefers to have this scheduled at [place].  Requested that the [place] be scheduled after 3:00 PM., if possible.    Advised will send note to the office with her request.

## 2018-03-24 NOTE — Telephone Encounter (Signed)
Unable to leave message for patient to return call back. PEC may give and obtain information.

## 2018-03-24 NOTE — Telephone Encounter (Signed)
Patient wants to know is there is another medication other flagyl.

## 2018-03-24 NOTE — Telephone Encounter (Signed)
I was unable to get the patient an appointment with [facility] [facility] today , therefore she will go to [facility] outpatient to have her ultrasound.

## 2018-03-24 NOTE — Telephone Encounter (Signed)
No other medication as disc'ed at appt to treat H pylori given her allergies  We can do the generic does she want to do that if cost is an issue?   [name]

## 2018-03-24 NOTE — Telephone Encounter (Signed)
unable to leave message for patient to return call back, vm box full. PEC may give information.  Due to positive D-Dimer and HX of DVT, she needs to have [place] of both legs. Appointment scheduled at [facility] outpatient imaging at the [facility] location @ 1415.

## 2018-03-24 NOTE — Progress Notes (Signed)
Has pt been notified about [place] legs   [place]

## 2018-03-25 ENCOUNTER — Other Ambulatory Visit: Payer: Self-pay | Admitting: Internal Medicine

## 2018-03-25 DIAGNOSIS — A048 Other specified bacterial intestinal infections: Secondary | ICD-10-CM

## 2018-03-25 LAB — D-DIMER, QUANTITATIVE

## 2018-03-25 MED ORDER — TINIDAZOLE 500 MG PO TABS: 500.0000 mg | ORAL_TABLET | Freq: Two times a day (BID) | ORAL | 0 refills | Status: DC

## 2018-03-25 NOTE — Telephone Encounter (Signed)
Spoken to patient she doesn't want to try a nausea medication nor add it to anything, she "will still vomit it up".  Patient is asking if she can take Tindamax for SX.

## 2018-03-25 NOTE — Telephone Encounter (Signed)
Pt called back to speak with brock. Pt states that she has additional information. Please advise Cb#[phone]

## 2018-03-25 NOTE — Telephone Encounter (Signed)
Please advise

## 2018-03-25 NOTE — Telephone Encounter (Signed)
Provider sent in medication.

## 2018-03-25 NOTE — Telephone Encounter (Signed)
No this medication does not treat H pylori as we discussed at her appt   [place]

## 2018-03-28 DIAGNOSIS — G4733 Obstructive sleep apnea (adult) (pediatric): Secondary | ICD-10-CM | POA: Diagnosis not present

## 2018-03-31 LAB — URIC ACID: Uric Acid, Serum: 8.7 mg/dL — ABNORMAL HIGH (ref 2.5–7.0)

## 2018-03-31 LAB — MITOCHONDRIAL ANTIBODIES: Mitochondrial M2 Ab, IgG: <=20 U

## 2018-03-31 LAB — ANTI-SMOOTH MUSCLE ANTIBODY, IGG: Actin (Smooth Muscle) Antibody (IGG): <20 U (ref <20)

## 2018-03-31 LAB — ANTI-NUCLEAR AB-TITER (ANA TITER): ANA TITER: 1:40 {titer} — ABNORMAL HIGH

## 2018-03-31 LAB — ANTI-MICROSOMAL ANTIBODY LIVER / KIDNEY: LKM1 Ab: <=20 U (ref <=20.0)

## 2018-03-31 LAB — ANA: Anti Nuclear Antibody(ANA): POSITIVE — AB

## 2018-04-11 ENCOUNTER — Telehealth: Payer: Self-pay | Admitting: Internal Medicine

## 2018-04-11 NOTE — Telephone Encounter (Signed)
She can move appt back to late 05/2018, early 06/2018 I want her to see her lung doctor and the heart doctor I referred her to in the meantime  Reschedule appt to 05/2018, early 06/2018 please   [place]

## 2018-04-11 NOTE — Telephone Encounter (Signed)
Copied from [place] [id number]. Topic: Quick Communication - See Telephone Encounter >> Apr 11, 2018 10:56 AM Blase Mess A wrote: CRM for notification. See Telephone encounter for: 04/11/18.  Patient was in the office on 03/22/18 Patient is scheduled for appt on 04/14/18.  Wanting to know did she still want the patient to come in.  Please advise?  [phone]

## 2018-04-13 NOTE — Telephone Encounter (Signed)
Unable to leave message for patient to return call back, VM Box full. PEC may give results and obtain information.

## 2018-04-13 NOTE — Telephone Encounter (Signed)
Pt states that she is seeing her lung doctor on 04/29/2018 and the cardiologist she could not get in with until the end of February.

## 2018-04-14 ENCOUNTER — Ambulatory Visit: Payer: Self-pay | Admitting: Internal Medicine

## 2018-04-26 ENCOUNTER — Ambulatory Visit: Payer: Self-pay

## 2018-04-26 ENCOUNTER — Telehealth: Payer: Self-pay | Admitting: *Deleted

## 2018-04-26 NOTE — Telephone Encounter (Signed)
Copied from [place] [id number]. Topic: Quick Communication - See Telephone Encounter >> Mar 24, 2018  4:46 PM Babs Bertin, [name] wrote: CRM for notification. See Telephone encounter for: 03/24/18. >> Mar 25, 2018  3:52 PM Windy Kalata wrote: Earnest Bailey from [place] [facility] is calling asking if the FLAGYL 500 MG tablet [ can be changed to a generic , metronidazole 539m? The Insurance will not pay for it and she does not think that they make it any longer  Best call back is [phone]

## 2018-04-26 NOTE — Telephone Encounter (Signed)
Call Kristopher Oppenheim back [phone] -Why are we still discussing this I sent tindamax on 03/25/18 ?   Call pharmacy did pt pick up if not does pt want to take generic? she previously had issues with this and wanted brand   [place]

## 2018-04-27 NOTE — Telephone Encounter (Signed)
Spoken to pharmacist. Pt has already picked up medication at another store.

## 2018-04-28 DIAGNOSIS — G4733 Obstructive sleep apnea (adult) (pediatric): Secondary | ICD-10-CM | POA: Diagnosis not present

## 2018-05-02 DIAGNOSIS — I2721 Secondary pulmonary arterial hypertension: Secondary | ICD-10-CM | POA: Diagnosis not present

## 2018-05-12 ENCOUNTER — Ambulatory Visit: Payer: Self-pay | Admitting: *Deleted

## 2018-05-12 NOTE — Telephone Encounter (Signed)
Message from Esaw Dace sent at 05/12/2018 4:05 PM EST   Summary: Rx Request/Gout   Pt called and stated her gout has flared up again and it's been 4 days. She is requesting rx for colchicine and Ibuprofen and she would like to know how to take the colchicine. She stated the gout has not gone down at all it has stayed present. Please advise. LR#[phone]         Pt called requesting a refill for the colchicine and 800 mg of Ibuprofen. She states her gout has flared up again. And she also wants to know how to take the colchicine. Requesting a call back please.

## 2018-05-15 ENCOUNTER — Other Ambulatory Visit: Payer: Self-pay | Admitting: Internal Medicine

## 2018-05-15 DIAGNOSIS — M109 Gout, unspecified: Secondary | ICD-10-CM

## 2018-05-15 MED ORDER — COLCHICINE 0.6 MG PO TABS: 0.6000 mg | ORAL_TABLET | Freq: Every day | ORAL | 0 refills | Status: DC

## 2018-05-15 MED ORDER — INDOMETHACIN 50 MG PO CAPS: 50.0000 mg | ORAL_CAPSULE | Freq: Three times a day (TID) | ORAL | 0 refills | Status: DC | PRN

## 2018-05-15 NOTE — Telephone Encounter (Signed)
If gout continues to flare she may benefit from seeing a rheumatologist in the future  Let me know  Make sure taking Allopurinol 300 mg daily  Sent as needed colchicine short term and short term indomethicin similar to ibuprofen    [place]

## 2018-05-16 ENCOUNTER — Telehealth: Payer: Self-pay | Admitting: Internal Medicine

## 2018-05-16 NOTE — Telephone Encounter (Signed)
See other phone note

## 2018-05-16 NOTE — Telephone Encounter (Signed)
Copied from [place] [phone]. Topic: Quick Communication - Rx Refill/Question >> May 16, 2018  1:53 PM Scherrie Gerlach wrote: Medication: 1.  indomethacin (INDOCIN) 50 MG capsule  Pt states she is allergic to this med.  The pharmacy called her to advise she has a discrepancy on file.  Pt has ulcers and would like the 800 mg ibuprofen.  2.  colchicine 0.6 MG tablet pt states her insurance will not cover.  They covered before, not this year. With a discount card it is $109.00. Please advise if there is something else.  [id number] [place] - [phone](Phone) [phone](Fax)

## 2018-05-16 NOTE — Telephone Encounter (Signed)
LMTCB OK for PEC to advise

## 2018-05-17 ENCOUNTER — Other Ambulatory Visit: Payer: Self-pay | Admitting: Internal Medicine

## 2018-05-17 DIAGNOSIS — M109 Gout, unspecified: Secondary | ICD-10-CM

## 2018-05-17 MED ORDER — COLCHICINE 0.6 MG PO TABS: 0.6000 mg | ORAL_TABLET | Freq: Every day | ORAL | 1 refills | Status: DC

## 2018-05-17 NOTE — Telephone Encounter (Signed)
There is nothing other than colchicine just steroids orally, does she want to try this ? Sent in [place] instead to see if cost is cheaper   Ibuprofen would be same as indocin and not recommended if she has h/o ulcers   [place]

## 2018-05-19 DIAGNOSIS — Z9989 Dependence on other enabling machines and devices: Secondary | ICD-10-CM | POA: Diagnosis not present

## 2018-05-19 DIAGNOSIS — Z9010 Acquired absence of unspecified breast and nipple: Secondary | ICD-10-CM | POA: Diagnosis not present

## 2018-05-19 DIAGNOSIS — I50812 Chronic right heart failure: Secondary | ICD-10-CM | POA: Diagnosis not present

## 2018-05-19 DIAGNOSIS — K7460 Unspecified cirrhosis of liver: Secondary | ICD-10-CM | POA: Diagnosis not present

## 2018-05-19 DIAGNOSIS — Z7982 Long term (current) use of aspirin: Secondary | ICD-10-CM | POA: Diagnosis not present

## 2018-05-19 DIAGNOSIS — Z9221 Personal history of antineoplastic chemotherapy: Secondary | ICD-10-CM | POA: Diagnosis not present

## 2018-05-19 DIAGNOSIS — Z6837 Body mass index (BMI) 37.0-37.9, adult: Secondary | ICD-10-CM | POA: Diagnosis not present

## 2018-05-19 DIAGNOSIS — I2721 Secondary pulmonary arterial hypertension: Secondary | ICD-10-CM | POA: Diagnosis not present

## 2018-05-19 DIAGNOSIS — I10 Essential (primary) hypertension: Secondary | ICD-10-CM | POA: Diagnosis not present

## 2018-05-19 DIAGNOSIS — G4733 Obstructive sleep apnea (adult) (pediatric): Secondary | ICD-10-CM | POA: Diagnosis not present

## 2018-05-19 DIAGNOSIS — E119 Type 2 diabetes mellitus without complications: Secondary | ICD-10-CM | POA: Diagnosis not present

## 2018-05-19 DIAGNOSIS — Z79899 Other long term (current) drug therapy: Secondary | ICD-10-CM | POA: Diagnosis not present

## 2018-05-19 DIAGNOSIS — Z87891 Personal history of nicotine dependence: Secondary | ICD-10-CM | POA: Diagnosis not present

## 2018-05-19 DIAGNOSIS — Z882 Allergy status to sulfonamides status: Secondary | ICD-10-CM | POA: Diagnosis not present

## 2018-05-19 DIAGNOSIS — Z7984 Long term (current) use of oral hypoglycemic drugs: Secondary | ICD-10-CM | POA: Diagnosis not present

## 2018-05-19 DIAGNOSIS — M109 Gout, unspecified: Secondary | ICD-10-CM | POA: Diagnosis not present

## 2018-05-19 MED ORDER — IBUPROFEN 800 MG PO TABS: 800.0000 mg | ORAL_TABLET | Freq: Three times a day (TID) | ORAL | 0 refills | Status: DC | PRN

## 2018-05-19 NOTE — Telephone Encounter (Signed)
Informed patient about the risk of taken the ibuprofen and refill of the ibuprofen has been given

## 2018-05-19 NOTE — Telephone Encounter (Signed)
Patient has been informed.  She had no further questions.

## 2018-05-24 ENCOUNTER — Encounter (HOSPITAL_COMMUNITY): Payer: Self-pay | Admitting: Internal Medicine

## 2018-05-24 ENCOUNTER — Ambulatory Visit (HOSPITAL_COMMUNITY)
Admission: RE | Admit: 2018-05-24 | Discharge: 2018-05-24 | Disposition: A | Discharge status: Home / Self Care | Payer: BLUE CROSS/BLUE SHIELD | Source: Ambulatory Visit | Attending: Internal Medicine | Admitting: Internal Medicine

## 2018-05-24 ENCOUNTER — Other Ambulatory Visit: Payer: Self-pay

## 2018-05-24 VITALS — BP 142/94 | HR 106 | Wt 226.0 lb

## 2018-05-24 DIAGNOSIS — Z86711 Personal history of pulmonary embolism: Secondary | ICD-10-CM | POA: Insufficient documentation

## 2018-05-24 DIAGNOSIS — Z7984 Long term (current) use of oral hypoglycemic drugs: Secondary | ICD-10-CM | POA: Diagnosis not present

## 2018-05-24 DIAGNOSIS — Z8249 Family history of ischemic heart disease and other diseases of the circulatory system: Secondary | ICD-10-CM | POA: Diagnosis not present

## 2018-05-24 DIAGNOSIS — M1990 Unspecified osteoarthritis, unspecified site: Secondary | ICD-10-CM | POA: Diagnosis not present

## 2018-05-24 DIAGNOSIS — Z79899 Other long term (current) drug therapy: Secondary | ICD-10-CM | POA: Insufficient documentation

## 2018-05-24 DIAGNOSIS — Z86718 Personal history of other venous thrombosis and embolism: Secondary | ICD-10-CM | POA: Insufficient documentation

## 2018-05-24 DIAGNOSIS — I272 Pulmonary hypertension, unspecified: Secondary | ICD-10-CM | POA: Insufficient documentation

## 2018-05-24 DIAGNOSIS — E119 Type 2 diabetes mellitus without complications: Secondary | ICD-10-CM | POA: Diagnosis not present

## 2018-05-24 DIAGNOSIS — Z87891 Personal history of nicotine dependence: Secondary | ICD-10-CM | POA: Insufficient documentation

## 2018-05-24 DIAGNOSIS — I2721 Secondary pulmonary arterial hypertension: Secondary | ICD-10-CM

## 2018-05-24 NOTE — Consult Note (Signed)
PULMONARY HTN CLINIC CONSULT NOTE  Referring Physician: Dr. Karlyn Agee [facility] doc: Dr. Daphane Shepherd (Pulmonary at [facility])  HPI:  [name] is a 53 y.o.female with confirmed pulmonary hypertension due iPAH, RV failure, OSA/OHS, breast CA, previous PE referred by Dr. [name] Dubin for further evaluation of PAh.   She is currently followed closely for PAH by Dr. Harrietta Guardian at [facility]. Chronic thromboembolism was questioned at one time but since ruled out by pulmonary angiogram. She is currently on macitentan, sildenafil, selexipag. Recent echo shows worsening RV failure and is scheduled for RHC at [facility] in 2 weeks with eye toward starting IV remodulin.  She has good days and bad days. In general can do all ADLs unless weather is bad than she struggles. Wears O2. Wears CPAP at night. Mild edema. Improved with torsemide.    Review of Systems: [y] = yes, _0  = no   General: Weight gain _1 ; Weight loss _2 ; Anorexia _3 ; Fatigue [ y]; Fever _4 ; Chills _5 ; Weakness _6   Cardiac: Chest pain/pressure _7 ; Resting SOB _8 ; Exertional SOB Blue.[place]ese ]; Orthopnea _9 ; Pedal Edema Blue.Reese ]; Palpitations _10 ; Syncope _11 ; Presyncope _12 ; Paroxysmal nocturnal dyspnea_13   Pulmonary: Cough _14 ; Wheezing_15 ; Hemoptysis_16 ; Sputum _17 ; Snoring _18   GI: Vomiting_19 ; Dysphagia_20 ; Melena_21 ; Hematochezia _22 ; Heartburn_23 ; Abdominal pain _24 ; Constipation _25 ; Diarrhea _26 ; BRBPR _27   GU: Hematuria_28 ; Dysuria _29 ; Nocturia_30   Vascular: Pain in legs with walking _31 ; Pain in feet with lying flat _32 ; Non-healing sores _33 ; Stroke _34 ; TIA _35 ; Slurred speech _36 ;  Neuro: Headaches_37 ; Vertigo_38 ; Seizures_39 ; Paresthesias_40 ;Blurred vision _41 ; Diplopia _42 ; Vision changes _43   Ortho/Skin: Arthritis Blue.Reese ]; Joint pain Blue.Reese ]; Muscle pain _44 ; Joint swelling _45 ; Back Pain _46 ; Rash _47   Psych: Depression_48 ; Anxiety_49   Heme: Bleeding problems _50 ; Clotting disorders _51 ; Anemia _52   Endocrine: Diabetes _53 ; Thyroid dysfunction[  ]   Past Medical History:  Diagnosis Date  . Anemia    iron deficiency  . Arthritis   . Borderline diabetes   . Complication of anesthesia    woke up slowly- after hysterectomy- 2015  . Diabetes mellitus, type II ([place])   . DVT (deep venous thrombosis) ([place]) 2014   left leg  . Family history of breast cancer   . Gout   . HTN (hypertension)   . Malignant neoplasm of upper-inner quadrant of left female breast ([place]) 03/06/2016  . Menorrhagia    secondary to uterine fibroids  . OSA (obstructive sleep apnea)    07/25/13 HST AHI 33/hr, severe hypoxemia O2 min 42% and 95% of the time <89%  . PE (pulmonary embolism) 2014   bilateral  . Pulmonary artery hypertension ([place])   . Pulmonary nodule    (27m on loeft lower lobe) found on CT scan July 2014, repeat scan Jan 2015 showed less than 429m . Right ovarian cyst    noted 09/2012   . S/P TAH (total abdominal hysterectomy) 06/07/2013  . Trichomoniasis    05/2011     Current Outpatient Medications  Medication Sig Dispense Refill  . acetaminophen (TYLENOL) 500 MG tablet Take 1,000 mg by mouth every 6 (six) hours as needed for moderate pain or headache.     . albuterol (PROVENTIL HFA;VENTOLIN  HFA) 108 (90 Base) MCG/ACT inhaler Inhale 2 puffs into the lungs every 6 (six) hours as needed for wheezing or shortness of breath. 1 Inhaler 12  . allopurinol (ZYLOPRIM) 300 MG tablet Take 1 tablet (300 mg total) by mouth daily. 90 tablet 3  . anastrozole (ARIMIDEX) 1 MG tablet Take 1 mg by mouth daily.  1  . bismuth subsalicylate (PEPTO-BISMOL) 262 MG/15ML suspension Take 30 mLs by mouth 4 (four) times daily -  before meals and at bedtime. X 2 weeks 473 mL 0  . cetirizine (ZYRTEC) 10 MG tablet Take 10 mg by mouth daily.    . chlorpheniramine-HYDROcodone (TUSSIONEX PENNKINETIC ER) 10-8 MG/5ML SUER Take 5 mLs by mouth at bedtime as needed for cough. 140 mL 0  . Cholecalciferol 50000 units capsule Take 1 capsule (50,000 Units total) by mouth once a week. 13  capsule 1  . colchicine (COLCRYS) 0.6 MG tablet Take 1 tablet (0.6 mg total) by mouth daily. May repeat for 1 dose if needed prn. 90 tablet 1  . FLAGYL 500 MG tablet Take 1 tablet (500 mg total) by mouth 2 (two) times daily. 14 tablet 0  . fluticasone (FLONASE) 50 MCG/ACT nasal spray Place 2 sprays into both nostrils daily.     . Fluticasone-Salmeterol (ADVAIR DISKUS) 250-50 MCG/DOSE AEPB Inhale 1 puff into the lungs 2 (two) times daily. Rinse mouth 60 each 12  . ibuprofen (ADVIL,MOTRIN) 800 MG tablet Take 1 tablet (800 mg total) by mouth every 8 (eight) hours as needed. 90 tablet 0  . metFORMIN (GLUCOPHAGE) 500 MG tablet Take 1 tablet (500 mg total) by mouth 2 (two) times daily with a meal. 60 tablet 2  . metoprolol tartrate (LOPRESSOR) 25 MG tablet Take 25 mg by mouth daily.    . Multiple Vitamin (MULTIVITAMIN) tablet Take 1 tablet by mouth daily.    Marland Kitchen omeprazole (PRILOSEC) 40 MG capsule Take 1 capsule (40 mg total) by mouth daily. 30 minutes before dinner 90 capsule 3  . OPSUMIT 10 MG TABS Take 10 mg by mouth daily.  8  . oxyCODONE (OXY IR/ROXICODONE) 5 MG immediate release tablet Take 1 tablet (5 mg total) by mouth every 6 (six) hours as needed for severe pain. 10 tablet 0  . Polyethyl Glycol-Propyl Glycol (SYSTANE OP) Place 1 drop into both eyes daily as needed (dry eyes).     . predniSONE (DELTASONE) 20 MG tablet Take 1 tablet (20 mg total) by mouth daily with breakfast. 10 tablet 0  . sildenafil (REVATIO) 20 MG tablet Take 40 mg by mouth 3 (three) times daily.     Marland Kitchen spironolactone (ALDACTONE) 25 MG tablet Take 1 tablet (25 mg total) by mouth daily. 90 tablet 1  . telmisartan (MICARDIS) 40 MG tablet Take 1 tablet (40 mg total) by mouth daily. 90 tablet 3  . tinidazole (TINDAMAX) 500 MG tablet Take 1 tablet (500 mg total) by mouth 2 (two) times daily with a meal. 28 tablet 0  . torsemide (DEMADEX) 20 MG tablet Take 20 mg by mouth daily as needed.     . triamcinolone cream (KENALOG) 0.1 %  Apply 1 application topically daily as needed (leg discoloration).    Marland Kitchen UPTRAVI 400 MCG TABS Take 400 mcg by mouth 2 (two) times daily.   10   No current facility-administered medications for this encounter.    Facility-Administered Medications Ordered in Other Encounters  Medication Dose Route Frequency Provider Last Rate Last Dose  . heparin lock flush 100 unit/mL  500 Units Intracatheter Once PRN Nicholas Lose, MD      . sodium chloride flush (NS) 0.9 % injection 10 mL  10 mL Intracatheter PRN Nicholas Lose, MD        Allergies  Allergen Reactions  . Ampicillin Other (See Comments)    Severe abdominal pain, dizziness (penicillin is okay)  . Amoxicillin Other (See Comments)    LIGHT-HEADED and CLOSE TO "PASSING OUT"  AND ABDOMINAL PAIN Has patient had a PCN reaction causing immediate rash, facial/tongue/throat swelling, SOB or lightheadedness with hypotension: No Has patient had a PCN reaction causing severe rash involving mucus membranes or skin necrosis: No Has patient had a PCN reaction that required hospitalization No Has patient had a PCN reaction occurring within the last 10 years: No If all of the above answers are "NO", then may proceed with Cephalosporin use.  . Nsaids     GI ulcers   . Sulfa Antibiotics Rash and Other (See Comments)    Very bad yeast infection      Social History   Socioeconomic History  . Marital status: Single    Spouse name: Not on file  . Number of children: 1  . Years of education: Not on file  . Highest education level: Not on file  Occupational History  . Not on file  Social Needs  . Financial resource strain: Not on file  . Food insecurity:    Worry: Not on file    Inability: Not on file  . Transportation needs:    Medical: Not on file    Non-medical: Not on file  Tobacco Use  . Smoking status: Former Smoker    Packs/day: 0.33    Years: 10.00    Pack years: 3.30    Types: Cigarettes    Last attempt to quit: 10/04/2011    Years  since quitting: 6.6  . Smokeless tobacco: Never Used  Substance and Sexual Activity  . Alcohol use: Yes    Alcohol/week: 0.0 standard drinks    Comment: social  . Drug use: No  . Sexual activity: Yes  Lifestyle  . Physical activity:    Days per week: Not on file    Minutes per session: Not on file  . Stress: Not on file  Relationships  . Social connections:    Talks on phone: Not on file    Gets together: Not on file    Attends religious service: Not on file    Active member of club or organization: Not on file    Attends meetings of clubs or organizations: Not on file    Relationship status: Not on file  . Intimate partner violence:    Fear of current or ex partner: Not on file    Emotionally abused: Not on file    Physically abused: Not on file    Forced sexual activity: Not on file  Other Topics Concern  . 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 Hypertension Mother   . Kidney disease Mother   . Hypertension Father   . Stroke Sister   . Breast cancer Maternal Grandmother        died at 19  . Breast  cancer Paternal Grandmother   . Breast cancer Cousin        pat first cousin dx in her 37s  . Hypertension Brother   . Cancer Maternal Aunt        unknown form  . Breast cancer Paternal Aunt   . Colon cancer Other 78       MGMs brother  . Breast cancer Paternal Aunt   . Cancer Other        breast ca in [place]  . Cancer Other        g uncle colon or stomach ca    Vitals:   05/24/18 1151  BP: (!) 142/94  Pulse: (!) 106  SpO2: 90%  Weight: 102.5 kg (226 lb)    PHYSICAL EXAM: General:  Wearing O2 No respiratory difficulty HEENT: normal Neck: supple. JVP 8-9 Carotids 2+ bilat; no bruits. No lymphadenopathy or thryomegaly  appreciated. Cor: PMI nondisplaced. Regular rate & rhythm. + RV lift No rubs, gallops or murmurs. Lungs: clear Abdomen: obese soft, nontender, nondistended. No hepatosplenomegaly. No bruits or masses. Good bowel sounds. Extremities: no cyanosis, clubbing, rash, tr-1+ edema Neuro: alert & oriented x 3, cranial nerves grossly intact. moves all 4 extremities w/o difficulty. Affect pleasant.  ECG: NSR with RV strain Personally reviewed   ASSESSMENT & PLAN:  1. PAH  - WHO Group I due to [place]. H/o PE but pulmonary angiography without chronic PE - On triple therapy NYHA III - Followed closely at [facility] triple therapy - For RHC next week to decide on IV therapy - She will continue to follow at [facility]. I have nothing more to add to their excellent care at this time. - We will see as needed,  Glori Bickers, MD  2:21 PM

## 2018-05-24 NOTE — Patient Instructions (Signed)
Your physician recommends that you schedule a follow-up appointment as needed.    Do the following things EVERYDAY: 1) Weigh yourself in the morning before breakfast. Write it down and keep it in a log. 2) Take your medicines as prescribed 3) Eat low salt foods-Limit salt (sodium) to 2000 mg per day.  4) Stay as active as you can everyday 5) Limit all fluids for the day to less than 2 liters  Call our office if you have any questions or concerns [phone] opt 2

## 2018-05-25 ENCOUNTER — Ambulatory Visit: Payer: Self-pay | Admitting: Internal Medicine

## 2018-05-28 DIAGNOSIS — G4733 Obstructive sleep apnea (adult) (pediatric): Secondary | ICD-10-CM | POA: Diagnosis not present

## 2018-06-02 DIAGNOSIS — Z9010 Acquired absence of unspecified breast and nipple: Secondary | ICD-10-CM | POA: Diagnosis not present

## 2018-06-02 DIAGNOSIS — I272 Pulmonary hypertension, unspecified: Secondary | ICD-10-CM | POA: Diagnosis not present

## 2018-06-02 DIAGNOSIS — Z9221 Personal history of antineoplastic chemotherapy: Secondary | ICD-10-CM | POA: Diagnosis not present

## 2018-06-02 DIAGNOSIS — Z87891 Personal history of nicotine dependence: Secondary | ICD-10-CM | POA: Diagnosis not present

## 2018-06-02 DIAGNOSIS — I10 Essential (primary) hypertension: Secondary | ICD-10-CM | POA: Diagnosis not present

## 2018-06-02 DIAGNOSIS — Z882 Allergy status to sulfonamides status: Secondary | ICD-10-CM | POA: Diagnosis not present

## 2018-06-02 DIAGNOSIS — Z79899 Other long term (current) drug therapy: Secondary | ICD-10-CM | POA: Diagnosis not present

## 2018-06-02 DIAGNOSIS — Z7984 Long term (current) use of oral hypoglycemic drugs: Secondary | ICD-10-CM | POA: Diagnosis not present

## 2018-06-02 DIAGNOSIS — G4733 Obstructive sleep apnea (adult) (pediatric): Secondary | ICD-10-CM | POA: Diagnosis not present

## 2018-06-02 DIAGNOSIS — I2721 Secondary pulmonary arterial hypertension: Secondary | ICD-10-CM | POA: Diagnosis not present

## 2018-06-02 DIAGNOSIS — Z7982 Long term (current) use of aspirin: Secondary | ICD-10-CM | POA: Diagnosis not present

## 2018-06-02 DIAGNOSIS — Z853 Personal history of malignant neoplasm of breast: Secondary | ICD-10-CM | POA: Diagnosis not present

## 2018-06-02 DIAGNOSIS — Z923 Personal history of irradiation: Secondary | ICD-10-CM | POA: Diagnosis not present

## 2018-06-02 DIAGNOSIS — Z6837 Body mass index (BMI) 37.0-37.9, adult: Secondary | ICD-10-CM | POA: Diagnosis not present

## 2018-06-02 DIAGNOSIS — E119 Type 2 diabetes mellitus without complications: Secondary | ICD-10-CM | POA: Diagnosis not present

## 2018-06-02 DIAGNOSIS — Z79811 Long term (current) use of aromatase inhibitors: Secondary | ICD-10-CM | POA: Diagnosis not present

## 2018-06-21 ENCOUNTER — Ambulatory Visit: Payer: BLUE CROSS/BLUE SHIELD | Admitting: Internal Medicine

## 2018-06-21 ENCOUNTER — Other Ambulatory Visit: Payer: Self-pay

## 2018-06-21 ENCOUNTER — Telehealth: Payer: Self-pay | Admitting: Internal Medicine

## 2018-06-21 ENCOUNTER — Encounter: Payer: Self-pay | Admitting: Internal Medicine

## 2018-06-21 VITALS — BP 112/84 | HR 102 | Temp 98.4°F | Ht 65.0 in | Wt 226.6 lb

## 2018-06-21 DIAGNOSIS — R05 Cough: Secondary | ICD-10-CM

## 2018-06-21 DIAGNOSIS — E875 Hyperkalemia: Secondary | ICD-10-CM

## 2018-06-21 DIAGNOSIS — I272 Pulmonary hypertension, unspecified: Secondary | ICD-10-CM

## 2018-06-21 DIAGNOSIS — J309 Allergic rhinitis, unspecified: Secondary | ICD-10-CM

## 2018-06-21 DIAGNOSIS — B370 Candidal stomatitis: Secondary | ICD-10-CM

## 2018-06-21 DIAGNOSIS — M109 Gout, unspecified: Secondary | ICD-10-CM

## 2018-06-21 DIAGNOSIS — J9611 Chronic respiratory failure with hypoxia: Secondary | ICD-10-CM

## 2018-06-21 DIAGNOSIS — M79672 Pain in left foot: Secondary | ICD-10-CM

## 2018-06-21 DIAGNOSIS — R053 Chronic cough: Secondary | ICD-10-CM | POA: Insufficient documentation

## 2018-06-21 DIAGNOSIS — R0600 Dyspnea, unspecified: Secondary | ICD-10-CM

## 2018-06-21 DIAGNOSIS — R0982 Postnasal drip: Secondary | ICD-10-CM

## 2018-06-21 DIAGNOSIS — R0609 Other forms of dyspnea: Secondary | ICD-10-CM

## 2018-06-21 DIAGNOSIS — A048 Other specified bacterial intestinal infections: Secondary | ICD-10-CM

## 2018-06-21 DIAGNOSIS — R059 Cough, unspecified: Secondary | ICD-10-CM

## 2018-06-21 MED ORDER — AZELASTINE HCL 0.1 % NA SOLN: 1.0000 | Freq: Two times a day (BID) | NASAL | 12 refills | Status: DC

## 2018-06-21 MED ORDER — COLCHICINE 0.6 MG PO CAPS: 1.0000 | ORAL_CAPSULE | ORAL | 11 refills | Status: DC | PRN

## 2018-06-21 MED ORDER — TINIDAZOLE 500 MG PO TABS: 500.0000 mg | ORAL_TABLET | Freq: Two times a day (BID) | ORAL | 0 refills | Status: DC

## 2018-06-21 MED ORDER — HYDROCOD POLST-CPM POLST ER 10-8 MG/5ML PO SUER: 5.0000 mL | Freq: Every evening | ORAL | 0 refills | Status: DC | PRN

## 2018-06-21 MED ORDER — NYSTATIN 100000 UNIT/ML MT SUSP: 5.0000 mL | Freq: Four times a day (QID) | OROMUCOSAL | 0 refills | Status: DC

## 2018-06-21 MED ORDER — DOXYCYCLINE HYCLATE 100 MG PO TABS: 100.0000 mg | ORAL_TABLET | Freq: Two times a day (BID) | ORAL | 0 refills | Status: DC

## 2018-06-21 NOTE — Progress Notes (Signed)
Chief Complaint  Patient presents with  . Follow-up   2 month f/u  1. PAH O2 today 80% with exertion and pt left her O2 in the car and was given our office O2 today - had cath 06/02/2018 and Dr. Lacinda Axon pulm considering Remodulin iv but pt needs port to do this failed multiple outpatient medications  -she does report noncompliance with her PAH meds at times   2. hyperK 5.1 05/19/18 she reports she drank orange juice before this lab visit but has stopped taking additional K as well given low K diet list  3. Left foot pain in great toe and mid foot and heel uric acid 03/18/18 8.7 then 7.4 05/19/18 on allopurinol 300 mg daily insurance would not cover colcrys 0.6 but will cover mitigare per pt will send in Rx, takes Ibuprofen prn only with h/o GI ulcer only takes for gout flares though today explained risk with any NSAID 4.c/o white appearance of tongue though she is brushing  5. H/o H pylori treatment was interrupted did not have all the medications    Review of Systems  Constitutional: Negative for weight loss.  HENT: Negative for hearing loss.   Eyes: Negative for blurred vision.  Respiratory:       SOB with exertion  Chronic cough    Cardiovascular: Negative for chest pain and PND.  Gastrointestinal: Negative for abdominal pain.  Musculoskeletal: Negative for falls.       Left foot/toe pain resolved    Skin: Negative for rash.  Neurological: Negative for headaches.  Psychiatric/Behavioral: Negative for depression.   Past Medical History:  Diagnosis Date  . Anemia    iron deficiency  . Arthritis   . Borderline diabetes   . Complication of anesthesia    woke up slowly- after hysterectomy- 2015  . Diabetes mellitus, type II ([place])   . DVT (deep venous thrombosis) ([place]) 2014   left leg  . Family history of breast cancer   . Gout   . HTN (hypertension)   . 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 Menorrhagia    secondary to uterine  fibroids  . OSA (obstructive sleep apnea)    07/25/13 HST AHI 33/hr, severe hypoxemia O2 min 42% and 95% of the time <89%  . PE (pulmonary embolism) 2014   bilateral  . Pulmonary artery hypertension ([place])   . Pulmonary nodule    (52m on loeft lower lobe) found on CT scan July 2014, repeat scan Jan 2015 showed less than 420m . Right ovarian cyst    noted 09/2012   . S/P TAH (total abdominal hysterectomy) 06/07/2013  . Trichomoniasis    05/2011    Past Surgical History:  Procedure Laterality Date  . ABDOMINAL HYSTERECTOMY N/A 06/07/2013   Procedure: HYSTERECTOMY ABDOMINAL WITH BIALTERAL SALPINGECTOMY;  Surgeon: VaElveria RoyalsMD;  [facility];  Service: Gynecology;  Laterality: N/A;  . BIOPSY  09/02/2017   Procedure: BIOPSY;  Surgeon: ScWilford CornerMD;  [facility];  Service: Endoscopy;;  . BREAST LUMPECTOMY Left 07/27/2016   BREAST LUMPECTOMY WITH RADIOACTIVE SEED AND SENTINEL LYMPH NODE BIOPSY (Left)  . BREAST LUMPECTOMY WITH RADIOACTIVE SEED AND SENTINEL LYMPH NODE BIOPSY Left 07/27/2016   Procedure: BREAST LUMPECTOMY WITH RADIOACTIVE SEED AND SENTINEL LYMPH NODE BIOPSY;  Surgeon: FaStark KleinMD;  [facility] Service: General;  Laterality: Left;  . CARDIAC CATHETERIZATION N/A 12/24/2015   Procedure: Right Heart Cath;  Surgeon: JaUlice Dash  Einar Gip, MD;  Location: [facility];  Service: Cardiovascular;  Laterality: N/A;  . CESAREAN SECTION    . COLONOSCOPY    . COLONOSCOPY WITH PROPOFOL N/A 09/02/2017   Procedure: COLONOSCOPY WITH PROPOFOL;  Surgeon: Wilford Corner, MD;  [facility];  Service: Endoscopy;  Laterality: N/A;  . ESOPHAGOGASTRODUODENOSCOPY (EGD) WITH PROPOFOL N/A 09/02/2017   Procedure: ESOPHAGOGASTRODUODENOSCOPY (EGD) WITH PROPOFOL;  Surgeon: Wilford Corner, MD;  [facility];  Service: Endoscopy;  Laterality: N/A;  . IR CV LINE INJECTION  07/27/2017  . POLYPECTOMY  2008   Removal of uterine polyp  . PORT-A-CATH REMOVAL Right  09/09/2017   Procedure: REMOVAL PORT-A-CATH;  Surgeon: Stark Klein, MD;  Location: [facility];  Service: General;  Laterality: Right;  . PORTA CATH INSERTION  07/27/2016  . PORTACATH PLACEMENT Right 07/27/2016   Procedure: INSERTION PORT-A-CATH;  Surgeon: Stark Klein, MD;  Location: [facility];  Service: General;  Laterality: Right;  . SUBMUCOSAL INJECTION  09/02/2017   Procedure: SUBMUCOSAL INJECTION;  Surgeon: Wilford Corner, MD;  [facility];  Service: Endoscopy;;  epi injection   Family History  Problem Relation Age of Onset  . Hypertension Mother   . Kidney disease Mother   . Hypertension Father   . Stroke Sister   . Breast cancer Maternal Grandmother        died at 51  . Breast cancer Paternal Grandmother   . Breast cancer Cousin        pat first cousin dx in her 67s  . Hypertension Brother   . Cancer Maternal Aunt        unknown form  . Breast cancer Paternal Aunt   . Colon cancer Other 73       MGMs brother  . Breast cancer Paternal Aunt   . Cancer Other        breast ca in [place]  . Cancer Other        g uncle colon or stomach ca   Social History   Socioeconomic History  . Marital status: Single    Spouse name: Not on file  . Number of children: 1  . Years of education: Not on file  . Highest education level: Not on file  Occupational History  . Not on file  Social Needs  . Financial resource strain: Not on file  . Food insecurity:    Worry: Not on file    Inability: Not on file  . Transportation needs:    Medical: Not on file    Non-medical: Not on file  Tobacco Use  . Smoking status: Former Smoker    Packs/day: 0.33    Years: 10.00    Pack years: 3.30    Types: Cigarettes    Last attempt to quit: 10/04/2011    Years since quitting: 6.7  . Smokeless tobacco: Never Used  Substance and Sexual Activity  . Alcohol use: Yes    Alcohol/week: 0.0 standard drinks    Comment: social  . Drug use: No  . Sexual activity: Yes  Lifestyle   . Physical activity:    Days per week: Not on file    Minutes per session: Not on file  . Stress: Not on file  Relationships  . Social connections:    Talks on phone: Not on file    Gets together: Not on file    Attends religious service: Not on file    Active member of club or organization: Not on file  Attends meetings of clubs or organizations: Not on file    Relationship status: Not on file  . Intimate partner violence:    Fear of current or ex partner: Not on file    Emotionally abused: Not on file    Physically abused: Not on file    Forced sexual activity: Not on file  Other Topics Concern  . 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 acetaminophen (TYLENOL) 500 MG tablet Take 1,000 mg by mouth every 6 (six) hours as needed for moderate pain or headache.   . albuterol (PROVENTIL HFA;VENTOLIN HFA) 108 (90 Base) MCG/ACT inhaler Inhale 2 puffs into the lungs every 6 (six) hours as needed for wheezing or shortness of breath.  . allopurinol (ZYLOPRIM) 300 MG tablet Take 1 tablet (300 mg total) by mouth daily.  Marland Kitchen anastrozole (ARIMIDEX) 1 MG tablet Take 1 mg by mouth daily.  Marland Kitchen bismuth subsalicylate (PEPTO-BISMOL) 262 MG/15ML suspension Take 30 mLs by mouth 4 (four) times daily -  before meals and at bedtime. X 2 weeks  . cetirizine (ZYRTEC) 10 MG tablet Take 10 mg by mouth daily.  . chlorpheniramine-HYDROcodone (TUSSIONEX PENNKINETIC ER) 10-8 MG/5ML SUER Take 5 mLs by mouth at bedtime as needed for cough.  . Cholecalciferol 50000 units capsule Take 1 capsule (50,000 Units total) by mouth once a week.  . colchicine (COLCRYS) 0.6 MG tablet Take 1 tablet (0.6 mg total) by mouth daily. May repeat for 1 dose if needed prn.  . FLAGYL 500 MG tablet Take 1 tablet (500 mg total) by  mouth 2 (two) times daily.  . fluticasone (FLONASE) 50 MCG/ACT nasal spray Place 2 sprays into both nostrils daily.   . Fluticasone-Salmeterol (ADVAIR DISKUS) 250-50 MCG/DOSE AEPB Inhale 1 puff into the lungs 2 (two) times daily. Rinse mouth  . ibuprofen (ADVIL,MOTRIN) 800 MG tablet Take 1 tablet (800 mg total) by mouth every 8 (eight) hours as needed.  . metFORMIN (GLUCOPHAGE) 500 MG tablet Take 1 tablet (500 mg total) by mouth 2 (two) times daily with a meal.  . metoprolol tartrate (LOPRESSOR) 25 MG tablet Take 25 mg by mouth daily.  . Multiple Vitamin (MULTIVITAMIN) tablet Take 1 tablet by mouth daily.  Marland Kitchen omeprazole (PRILOSEC) 40 MG capsule Take 1 capsule (40 mg total) by mouth daily. 30 minutes before dinner  . OPSUMIT 10 MG TABS Take 10 mg by mouth daily.  Marland Kitchen oxyCODONE (OXY IR/ROXICODONE) 5 MG immediate release tablet Take 1 tablet (5 mg total) by mouth every 6 (six) hours as needed for severe pain.  Vladimir Faster Glycol-Propyl Glycol (SYSTANE OP) Place 1 drop into both eyes daily as needed (dry eyes).   . predniSONE (DELTASONE) 20 MG tablet Take 1 tablet (20 mg total) by mouth daily with breakfast.  . sildenafil (REVATIO) 20 MG tablet Take 40 mg by mouth 3 (three) times daily.   Marland Kitchen spironolactone (ALDACTONE) 25 MG tablet Take 1 tablet (25 mg total) by mouth daily.  Marland Kitchen telmisartan (MICARDIS) 40 MG tablet Take 1 tablet (40 mg total) by mouth daily.  Marland Kitchen tinidazole (TINDAMAX) 500 MG tablet Take 1 tablet (500 mg total) by mouth 2 (two) times daily with a meal.  . torsemide (DEMADEX) 20 MG tablet  Take 20 mg by mouth daily as needed.   . triamcinolone cream (KENALOG) 0.1 % Apply 1 application topically daily as needed (leg discoloration).  Marland Kitchen UPTRAVI 400 MCG TABS Take 400 mcg by mouth 2 (two) times daily.    Allergies  Allergen Reactions  . Ampicillin Other (See Comments)    Severe abdominal pain, dizziness (penicillin is okay)  . Amoxicillin Other (See Comments)    LIGHT-HEADED and CLOSE TO  "PASSING OUT"  AND ABDOMINAL PAIN Has patient had a PCN reaction causing immediate rash, facial/tongue/throat swelling, SOB or lightheadedness with hypotension: No Has patient had a PCN reaction causing severe rash involving mucus membranes or skin necrosis: No Has patient had a PCN reaction that required hospitalization No Has patient had a PCN reaction occurring within the last 10 years: No If all of the above answers are "NO", then may proceed with Cephalosporin use.  . Nsaids     GI ulcers   . Sulfa Antibiotics Rash and Other (See Comments)    Very bad yeast infection   No results found for this or any previous visit (from the past 2160 hour(s)). Objective  Body mass index is 37.71 kg/m. Wt Readings from Last 3 Encounters:  06/21/18 226 lb 9.6 oz (102.8 kg)  05/24/18 226 lb (102.5 kg)  03/21/18 248 lb 9.6 oz (112.8 kg)   Temp Readings from Last 3 Encounters:  06/21/18 98.4 F (36.9 C) (Oral)  03/21/18 98.1 F (36.7 C) (Oral)  03/01/18 97.7 F (36.5 C) (Oral)   BP Readings from Last 3 Encounters:  06/21/18 112/84  05/24/18 (!) 142/94  03/21/18 110/80   Pulse Readings from Last 3 Encounters:  06/21/18 (!) 102  05/24/18 (!) 106  03/21/18 (!) 111    Physical Exam Vitals signs and nursing note reviewed.  Constitutional:      Appearance: Normal appearance. She is well-developed and well-groomed. She is obese.  HENT:     Head: Normocephalic and atraumatic.     Nose: Nose normal.     Mouth/Throat:     Mouth: Mucous membranes are moist.     Pharynx: Oropharynx is clear.  Eyes:     Conjunctiva/sclera: Conjunctivae normal.     Pupils: Pupils are equal, round, and reactive to light.  Cardiovascular:     Rate and Rhythm: Normal rate and regular rhythm.     Heart sounds: Normal heart sounds. No murmur.  Pulmonary:     Effort: Pulmonary effort is normal.     Breath sounds: Normal breath sounds.  Musculoskeletal:     Right lower leg: 1+ Edema present.     Left lower  leg: 1+ Edema present.  Skin:    General: Skin is warm and dry.  Neurological:     General: No focal deficit present.     Mental Status: She is alert and oriented to person, place, and time. Mental status is at baseline.     Gait: Gait normal.  Psychiatric:        Attention and Perception: Attention and perception normal.        Mood and Affect: Mood and affect normal.        Speech: Speech normal.        Behavior: Behavior normal. Behavior is cooperative.        Thought Content: Thought content normal.        Cognition and Memory: Cognition and memory normal.        Judgment: Judgment normal.     Assessment  1. PAH with chronic dyspnea with exertion and hypoxia  2. Gout controlled for now  3. Chronic cough likely 2/2 PAH vs  PND 4. hyperK  5. Left foot pain and gout could be MSK and gout related both 6. Thrush  7. H pylori  8. HM Plan   1.  F/u [facility] pulm Considering remodulin iv weekly  2. alloupurinol 300 mg qd uric acid 7.4 05/19/18  3. Tussionex prn qhs likely related to #1 not long term  No acute issues  Flonase and astepro 4. Given low K diet list  5. Add cochicine 0.6 for flares and prn Ibuprofen sparingly  Uric acid 7.4 05/19/18  6. Nystatin  7. Re tx H pylori doxy, tinadazole, peptobismol, ppi  8.  Flu utd  MMR immune  Hep A vx given today will need repeat in 6-12 months  Hep B new vaccine 1/2 will need repeat at f/u  Tdap utd Consider shingrix in future and pna 23  Hep C negative11/29/17   Pap smear  -s/p TAH(w/o cervix only ovaries intact)DUB 2/2 fibroids, adenomyosis  -follows with Dr. M[facility] pap neg 6/2010last saw 01/2017 Dr. Benjie Karvonen -obtained records 04/14/17 visit  [facility] 06/28/2017 normalsee chart [facility] to get appt date   Colonoscopy/EGD sch 6/6/19[facility] Dr. 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 Lacinda Axon cath lab 06/02/2018 [place], worsening HD status on max oral therapy selexipag, sildenafil, macitentan will rec iv remodulin   Provider: Dr. Olivia Mackie McLean-Scocuzza-Internal Medicine

## 2018-06-21 NOTE — Telephone Encounter (Signed)
Pt called and said that that she was told by the pharmacy that her tussionex rx needs an PA

## 2018-06-21 NOTE — Progress Notes (Signed)
Pre visit review using our clinic review tool, if applicable. No additional management support is needed unless otherwise documented below in the visit note. 

## 2018-06-21 NOTE — Patient Instructions (Addendum)
Vitamin C can be good for gout   Vitamin D3 5000 IU daily   F/u in 6 months sooner if needed   Call about mammogram date and make sure they fax it to me [phone]   Take Flonase with Astelin nose spray   Low-Purine Eating Plan A low-purine eating plan involves making food choices to limit your intake of purine. Purine is a kind of uric acid. Too much uric acid in your blood can cause certain conditions, such as gout and kidney stones. Eating a low-purine diet can help control these conditions. What are tips for following this plan? Reading food labels   Avoid foods with saturated or Trans fat.  Check the ingredient list of grains-based foods, such as bread and cereal, to make sure that they contain whole grains.  Check the ingredient list of sauces or soups to make sure they do not contain meat or fish.  When choosing soft drinks, check the ingredient list to make sure they do not contain high-fructose corn syrup. Shopping  Buy plenty of fresh fruits and vegetables.  Avoid buying canned or fresh fish.  Buy dairy products labeled as low-fat or nonfat.  Avoid buying premade or processed foods. These foods are often high in fat, salt (sodium), and added sugar. Cooking  Use olive oil instead of butter when cooking. Oils like olive oil, canola oil, and sunflower oil contain healthy fats. Meal planning  Learn which foods do or do not affect you. If you find out that a food tends to cause your gout symptoms to flare up, avoid eating that food. You can enjoy foods that do not cause problems. If you have any questions about a food item, talk with your dietitian or health care provider.  Limit foods high in fat, especially saturated fat. Fat makes it harder for your body to get rid of uric acid.  Choose foods that are lower in fat and are lean sources of protein. General guidelines  Limit alcohol intake to no more than 1 drink a day for nonpregnant women and 2 drinks a day for  men. One drink equals 12 oz of beer, 5 oz of wine, or 1 oz of hard liquor. Alcohol can affect the way your body gets rid of uric acid.  Drink plenty of water to keep your urine clear or pale yellow. Fluids can help remove uric acid from your body.  If directed by your health care provider, take a vitamin C supplement.  Work with your health care provider and dietitian to develop a plan to achieve or maintain a healthy weight. Losing weight can help reduce uric acid in your blood. What foods are recommended? The items listed may not be a complete list. Talk with your dietitian about what dietary choices are best for you. Foods low in purines Foods low in purines do not need to be limited. These include:  All fruits.  All low-purine vegetables, pickles, and olives.  Breads, pasta, rice, cornbread, and popcorn. Cake and other baked goods.  All dairy foods.  Eggs, nuts, and nut butters.  Spices and condiments, such as salt, herbs, and vinegar.  Plant oils, butter, and margarine.  Water, sugar-free soft drinks, tea, coffee, and cocoa.  Vegetable-based soups, broths, sauces, and gravies. Foods moderate in purines Foods moderate in purines should be limited to the amounts listed.   cup of asparagus, cauliflower, spinach, mushrooms, or green peas, each day.  2/3 cup uncooked oatmeal, each day.   cup dry  wheat bran or wheat germ, each day.  2-3 ounces of meat or poultry, each day.  4-6 ounces of shellfish, such as crab, lobster, oysters, or shrimp, each day.  1 cup cooked beans, peas, or lentils, each day.  Soup, broths, or bouillon made from meat or fish. Limit these foods as much as possible. What foods are not recommended? The items listed may not be a complete list. Talk with your dietitian about what dietary choices are best for you. Limit your intake of foods high in purines, including:  Beer and other alcohol.  Meat-based gravy or sauce.  Canned or fresh fish,  such as: ? Anchovies, sardines, herring, and tuna. ? Mussels and scallops. ? Codfish, trout, and haddock.  Berniece Salines.  Organ meats, such as: ? Liver or kidney. ? Tripe. ? Sweetbreads (thymus gland or pancreas).  Wild Clinical biochemist.  Yeast or yeast extract supplements.  Drinks sweetened with high-fructose corn syrup. Summary  Eating a low-purine diet can help control conditions caused by too much uric acid in the body, such as gout or kidney stones.  Choose low-purine foods, limit alcohol, and limit foods high in fat.  You will learn over time which foods do or do not affect you. If you find out that a food tends to cause your gout symptoms to flare up, avoid eating that food. This information is not intended to replace advice given to you by your health care provider. Make sure you discuss any questions you have with your health care provider. Document Released: 07/11/2010 Document Revised: 04/29/2016 Document Reviewed: 04/29/2016 Elsevier Interactive Patient Education  2019 [employer].

## 2018-06-26 DIAGNOSIS — R069 Unspecified abnormalities of breathing: Secondary | ICD-10-CM | POA: Diagnosis not present

## 2018-06-26 DIAGNOSIS — G4733 Obstructive sleep apnea (adult) (pediatric): Secondary | ICD-10-CM | POA: Diagnosis not present

## 2018-07-01 ENCOUNTER — Telehealth: Payer: Self-pay

## 2018-07-01 NOTE — Telephone Encounter (Signed)
Copied from [place] [id number]. Topic: General - Inquiry >> Jul 01, 2018 10:46 AM Sheran Luz wrote: Reason for CRM: Patient requesting a work note stating that she is high risk for covid-19. This has been discussed with Dr. Terese Door previously. She is requesting this be sent to her through her mychart, if possible so she can provide it to her employer. Patient is also requesting a call back from [facility] when it is finished.

## 2018-07-01 NOTE — Telephone Encounter (Signed)
A letter is not going to work if she wants to be out of work either I or Dr. Harrietta Guardian should fill out FMLA she needs to talk to HR and request FMLA  Inform pt   [place]

## 2018-07-01 NOTE — Telephone Encounter (Signed)
Patient already has FMLA.

## 2018-07-02 ENCOUNTER — Telehealth: Payer: BLUE CROSS/BLUE SHIELD | Admitting: Physician Assistant

## 2018-07-02 DIAGNOSIS — R591 Generalized enlarged lymph nodes: Secondary | ICD-10-CM

## 2018-07-02 DIAGNOSIS — R0989 Other specified symptoms and signs involving the circulatory and respiratory systems: Secondary | ICD-10-CM

## 2018-07-02 NOTE — Progress Notes (Signed)
Patient never responding. Treating based on clinical information given.  We are sorry that you are not feeling well.  Here is how we plan to help!  Your symptoms indicate a likely viral infection (Pharyngitis).   Pharyngitis is inflammation in the back of the throat which can cause a sore throat, scratchiness and sometimes difficulty swallowing.   Pharyngitis is typically caused by a respiratory virus and will just run its course.  Please keep in mind that your symptoms could last up to 10 days.  For throat pain, we recommend over the counter oral pain relief medications such as acetaminophen or aspirin, or anti-inflammatory medications such as ibuprofen or naproxen sodium.  Topical treatments such as oral throat lozenges or sprays may be used as needed.  Avoid close contact with loved ones, especially the very young and elderly.  Remember to wash your hands thoroughly throughout the day as this is the number one way to prevent the spread of infection and wipe down door knobs and counters with disinfectant.  After careful review of your answers, I would not recommend and antibiotic for your condition.  Antibiotics should not be used to treat conditions that we suspect are caused by viruses like the virus that causes the common cold or flu. However, some people can have Strep with atypical symptoms. You may need formal testing in clinic or office to confirm if your symptoms continue or worsen.  Providers prescribe antibiotics to treat infections caused by bacteria. Antibiotics are very powerful in treating bacterial infections when they are used properly.  To maintain their effectiveness, they should be used only when necessary.  Overuse of antibiotics has resulted in the development of super bugs that are resistant to treatment!    Home Care:  Only take medications as instructed by your medical team.  Do not drink alcohol while taking these medications.  A steam or ultrasonic humidifier can help  congestion.  You can place a towel over your head and breathe in the steam from hot water coming from a faucet.  Avoid close contacts especially the very young and the elderly.  Cover your mouth when you cough or sneeze.  Always remember to wash your hands.  Get Help Right Away If:  You develop worsening fever or throat pain.  You develop a severe head ache or visual changes.  Your symptoms persist after you have completed your treatment plan.  Make sure you  Understand these instructions.  Will watch your condition.  Will get help right away if you are not doing well or get worse.  Your e-visit answers were reviewed by a board certified advanced clinical practitioner to complete your personal care plan.  Depending on the condition, your plan could have included both over the counter or prescription medications.  If there is a problem please reply  once you have received a response from your provider.  Your safety is important to [place].  If you have drug allergies check your prescription carefully.    You can use MyChart to ask questions about todays visit, request a non-urgent call back, or ask for a work or school excuse for 24 hours related to this e-Visit. If it has been greater than 24 hours you will need to follow up with your provider, or enter a new e-Visit to address those concerns.  You will get an e-mail in the next two days asking about your experience.  I hope that your e-visit has been valuable and will speed your recovery. Thank  you for using e-visits.

## 2018-07-02 NOTE — Progress Notes (Signed)
I have spent 5 minutes in review of e-visit questionnaire, review and updating patient chart, medical decision making and response to patient.   Leeanne Rio, PA-C

## 2018-07-02 NOTE — Progress Notes (Signed)
Message sent to patient for further information. Awaiting response.

## 2018-07-05 ENCOUNTER — Telehealth: Payer: Self-pay | Admitting: Internal Medicine

## 2018-07-05 ENCOUNTER — Encounter: Payer: Self-pay | Admitting: Internal Medicine

## 2018-07-05 DIAGNOSIS — Z853 Personal history of malignant neoplasm of breast: Secondary | ICD-10-CM | POA: Diagnosis not present

## 2018-07-05 DIAGNOSIS — R921 Mammographic calcification found on diagnostic imaging of breast: Secondary | ICD-10-CM | POA: Diagnosis not present

## 2018-07-05 LAB — HM MAMMOGRAPHY: HM Mammogram: ABNORMAL — AB (ref 0–4)

## 2018-07-05 NOTE — Telephone Encounter (Signed)
[facility] mammogram 07/05/2018 b/l diagnostic suspicious for malignancy  -stable post treatment changes on the left breast new 0.8 cm grouped amorphous calcifications  Right breast concerned with malignancy stereotactic biopsy is recommended pt stated she preferred to wait until May due to [place] [facility] per [facility] report   Pt will need appts scheduled with  1. Radiation oncology Dr. Gery Pray  2. Oncology Dr. Nicholas Lose  3. Surgery Dr. Paulita Fujita please call to scheduled all 3 above appts for pt asap  Thanks Vaughn

## 2018-07-07 ENCOUNTER — Telehealth: Payer: Self-pay | Admitting: Hematology and Oncology

## 2018-07-07 NOTE — Telephone Encounter (Signed)
Spoke with patient re f/u 4/13. Per 4/7 schedule message patient to be seen within 3 months. Patient now has new dx of breast cancer (new patient referral). Confirmed with desk nurse patient to be seen sooner.

## 2018-07-11 ENCOUNTER — Ambulatory Visit: Payer: Self-pay | Admitting: Hematology and Oncology

## 2018-07-11 ENCOUNTER — Telehealth: Payer: Self-pay | Admitting: Hematology and Oncology

## 2018-07-11 NOTE — Telephone Encounter (Signed)
I spoke with Mrs. [name]. She informed me that her current insurance is not covering [facility] and that she needs to see an oncologist at [facility]. She is also trying to figure out if [facility] is covered for the biopsy.  If her insurance does not cover [facility] or [facility] then we will have to make a referral to [facility] for with the radiology for the biopsy as well as a medical oncology follow-up over there.  I discussed with her that treatment discussions will have to wait for the biopsy results.  We discussed different scenarios including precancerous conditions like DCIS versus invasive cancer.  And if it is invasive cancer treatment options would depend on the receptors and prognostic panel.  It is too premature to discuss further at this time.  Since her insurance does not cover today's visit, we will cancel it and I am happy to help her on the telephone until she gets to see an oncologist at [facility]

## 2018-07-18 ENCOUNTER — Ambulatory Visit: Payer: BLUE CROSS/BLUE SHIELD | Attending: Radiation Oncology | Admitting: Radiation Oncology

## 2018-07-18 ENCOUNTER — Telehealth: Payer: Self-pay

## 2018-07-18 ENCOUNTER — Ambulatory Visit: Payer: BLUE CROSS/BLUE SHIELD

## 2018-07-18 NOTE — Telephone Encounter (Signed)
Contacted pt to inquire if she would like to cancel today's appt with Dr. Sondra Come in Radiation Oncology as her insurance does not cover [facility]. Pt would like to cancel. Conveyed to pt this appt would be canceled. Pt verbalized understanding and agreement. Loma Sousa, RN BSN

## 2018-07-28 DIAGNOSIS — G4733 Obstructive sleep apnea (adult) (pediatric): Secondary | ICD-10-CM | POA: Diagnosis not present

## 2018-07-28 DIAGNOSIS — R069 Unspecified abnormalities of breathing: Secondary | ICD-10-CM | POA: Diagnosis not present

## 2018-08-05 DIAGNOSIS — I50812 Chronic right heart failure: Secondary | ICD-10-CM | POA: Diagnosis not present

## 2018-08-05 DIAGNOSIS — I2721 Secondary pulmonary arterial hypertension: Secondary | ICD-10-CM | POA: Diagnosis not present

## 2018-08-05 DIAGNOSIS — Z9981 Dependence on supplemental oxygen: Secondary | ICD-10-CM | POA: Diagnosis not present

## 2018-08-05 DIAGNOSIS — Z6837 Body mass index (BMI) 37.0-37.9, adult: Secondary | ICD-10-CM | POA: Diagnosis not present

## 2018-08-19 ENCOUNTER — Other Ambulatory Visit: Payer: Self-pay | Admitting: Internal Medicine

## 2018-08-19 DIAGNOSIS — I10 Essential (primary) hypertension: Secondary | ICD-10-CM

## 2018-08-19 MED ORDER — SPIRONOLACTONE 25 MG PO TABS: 25.0000 mg | ORAL_TABLET | Freq: Every day | ORAL | 3 refills | Status: DC

## 2018-08-27 DIAGNOSIS — G4733 Obstructive sleep apnea (adult) (pediatric): Secondary | ICD-10-CM | POA: Diagnosis not present

## 2018-08-27 DIAGNOSIS — R069 Unspecified abnormalities of breathing: Secondary | ICD-10-CM | POA: Diagnosis not present

## 2018-09-14 DIAGNOSIS — C50212 Malignant neoplasm of upper-inner quadrant of left female breast: Secondary | ICD-10-CM | POA: Diagnosis not present

## 2018-09-14 DIAGNOSIS — I2721 Secondary pulmonary arterial hypertension: Secondary | ICD-10-CM | POA: Diagnosis not present

## 2018-09-14 DIAGNOSIS — R928 Other abnormal and inconclusive findings on diagnostic imaging of breast: Secondary | ICD-10-CM | POA: Diagnosis not present

## 2018-09-14 DIAGNOSIS — E119 Type 2 diabetes mellitus without complications: Secondary | ICD-10-CM | POA: Diagnosis not present

## 2018-09-14 DIAGNOSIS — I110 Hypertensive heart disease with heart failure: Secondary | ICD-10-CM | POA: Diagnosis not present

## 2018-09-18 IMAGING — CT CT NECK W/ CM
3 of 5 series · 12 of 33 positions shown, 14 images · IV contrast (ISOVUE)
Comparison: Head CT without contrast 07/11/2017, and earlier. Chest
CTA 02/10/2016.

CLINICAL DATA: 51-year-old female with facial swelling over the
past week, progressive throat swelling today. Stridor. Headache.

EXAM:
CT NECK WITH CONTRAST
TECHNIQUE: Multidetector CT imaging of the neck was performed using the
standard protocol following the bolus administration of intravenous
contrast.
CONTRAST:  75mL OMNIPAQUE IOHEXOL 300 MG/ML  [facility]

[Series 7: orthogonal ax · axial · 0.39mm/px · z∈[-255,-56]mm · 4 of 155 slices shown, 5 images]
[im 26/155  soft-tissue]
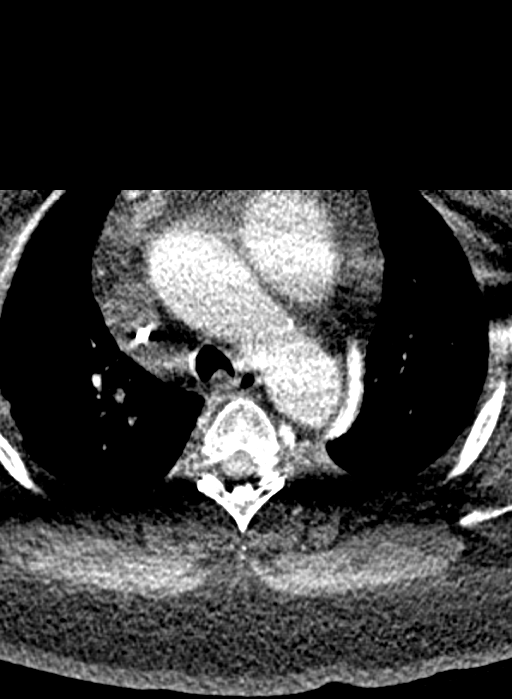
[im 26/155  bone]
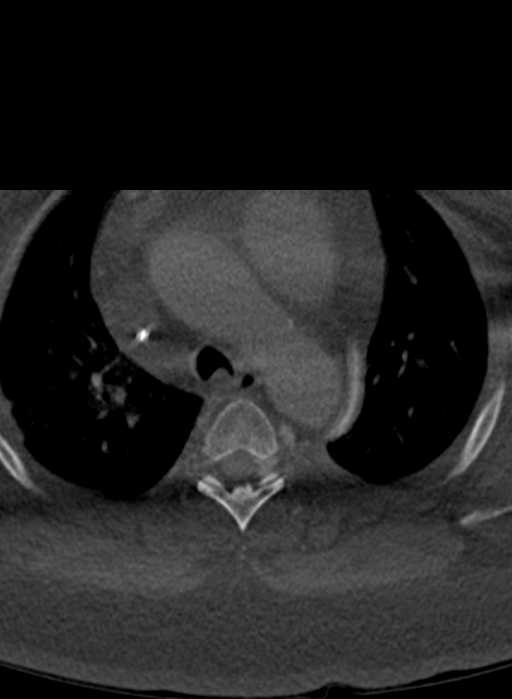
[im 52/155  bone]
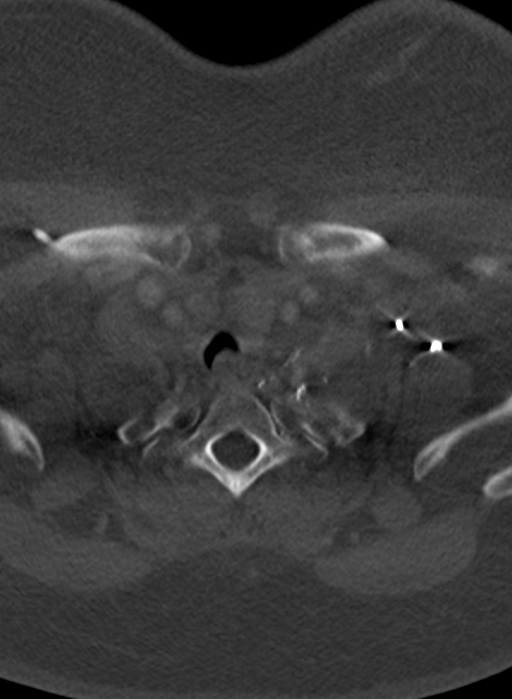
[im 103/155  bone]
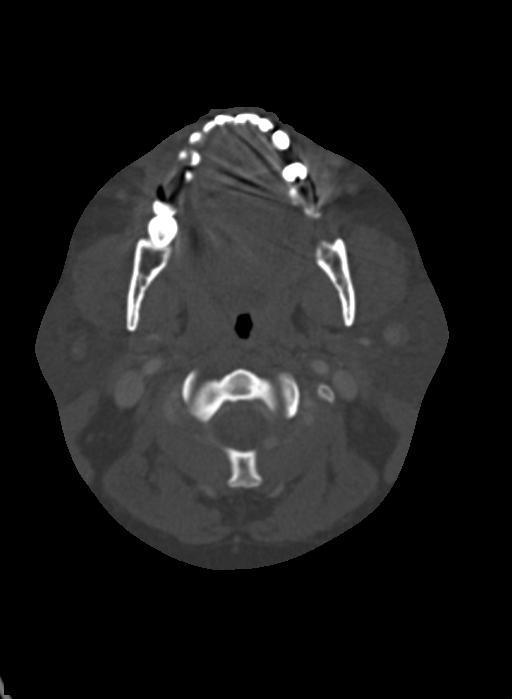
[im 129/155  bone]
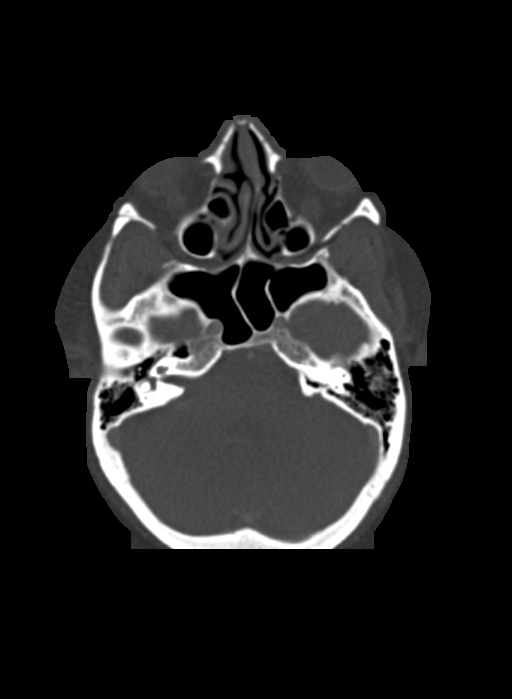

[Series 8: cor neck · coronal · 0.50mm/px · 3 of 132 slices shown]
[im 27/132  bone]
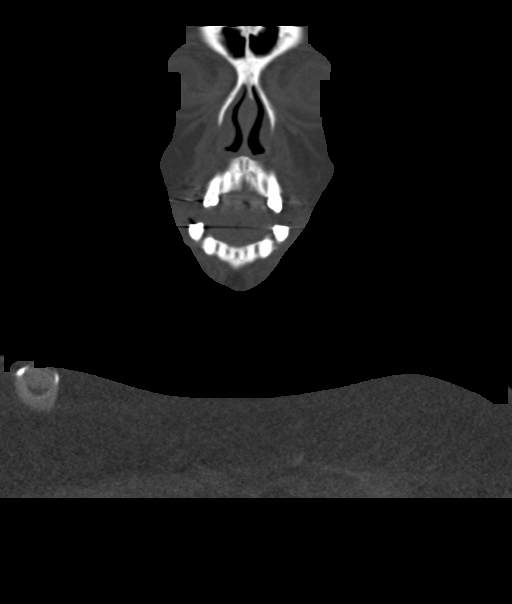
[im 53/132  bone]
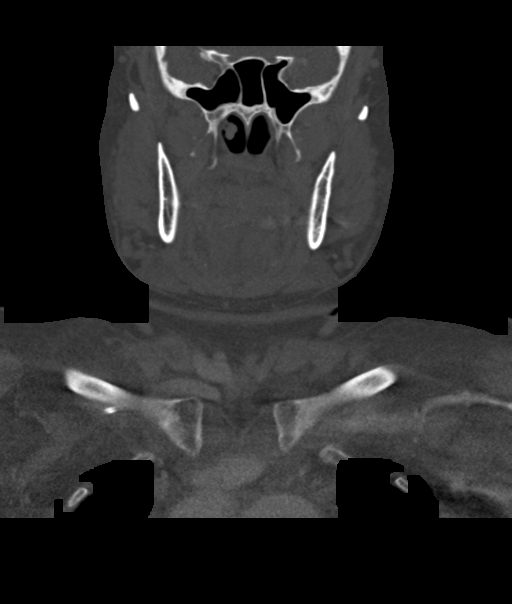
[im 79/132  bone]
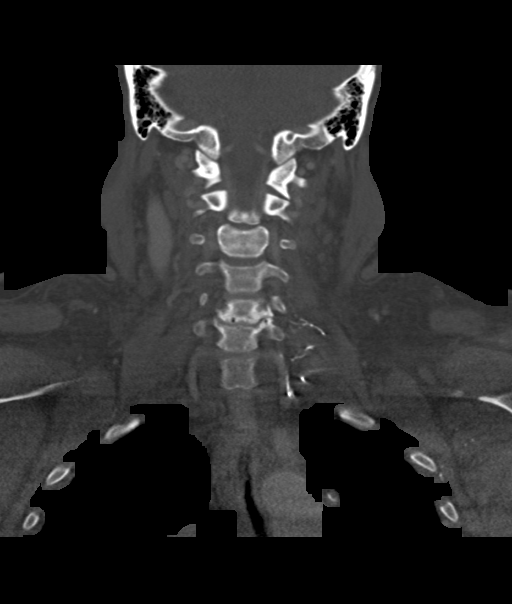

[Series 9: sag neck · sagittal · 0.48mm/px · 5 of 145 slices shown, 6 images]
[im 49/145  bone]
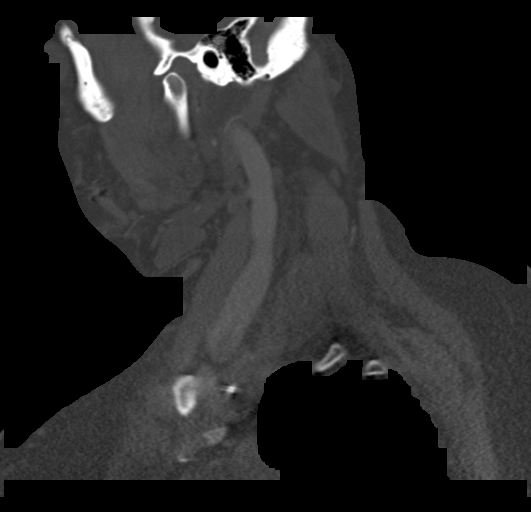
[im 61/145  bone]
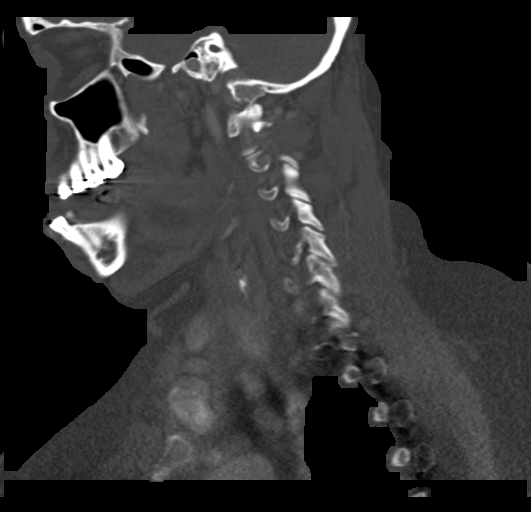
[im 73/145  soft-tissue]
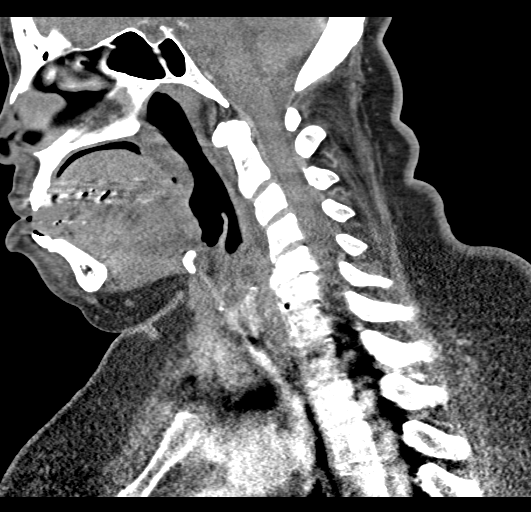
[im 73/145  bone]
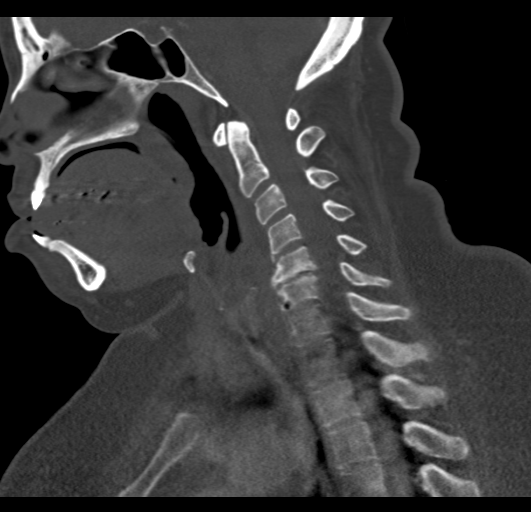
[im 85/145  bone]
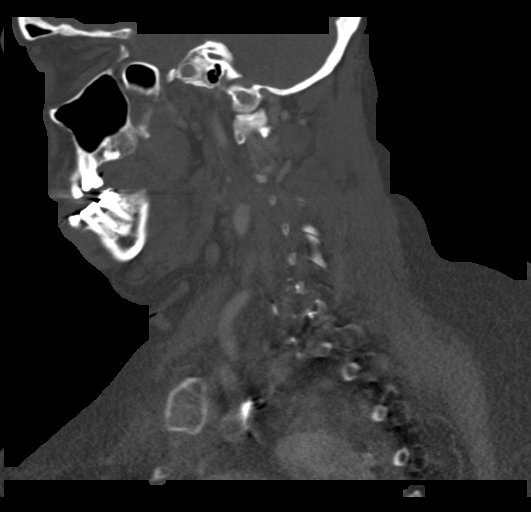
[im 97/145  bone]
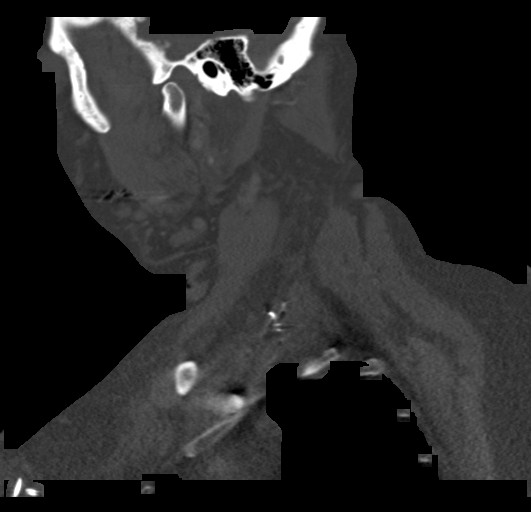

[12 of 33 positions shown; findings below may reference images not displayed]

FINDINGS: Pharynx and larynx: There is a retropharyngeal effusion from the C2
to the C5 level of the neck (series 3, image 53). No significant
mass effect on the posterior wall of the pharynx. The bilateral
parapharyngeal spaces remain normal.

The hypopharynx, oropharynx, and nasopharynx contours appear normal.
No tonsillar hyperenhancement.

The epiglottis and larynx appear normal.

Salivary glands: Negative sublingual space. The bilateral
submandibular glands and parotid glands appear symmetric and within
normal limits. Incidental prominent left retromandibular vein noted
(normal variant).

Thyroid: Negative.

Lymph nodes: No cervical lymphadenopathy.

Vascular: The major vascular structures in the neck and at the skull
base are patent. There is a partially retropharyngeal course of the
left carotid. The carotid bifurcations appear normal. There is a
right subclavian approach chest porta cath in place.

Limited intracranial: Negative.

Visualized orbits: Negative.

Mastoids and visualized paranasal sinuses: Clear.

Skeleton: No acute dental findings. Advanced disc and endplate
degeneration in the lower cervical spine at C5-C6. Trace vacuum
disc. No dystrophic calcifications of the longus coli muscles to
suggest hydroxyapatite deposition disease. No elongated stylohyoid
ligament calcification. No acute osseous abnormality identified.

Upper chest: Chronic enlargement of the main pulmonary artery,
incompletely Visualized today. Stable superior mediastinum. No
superior mediastinal lymphadenopathy. Stable and negative lung
apices. Mild atelectatic changes to the trachea.
IMPRESSION: 1. Positive for a small retropharyngeal effusion. This is
nonspecific. Such an appearance can be seen with acute URI, but
there is no evidence of acute tonsillitis, and no
pharyngeal/laryngeal inflammation evident.
2. No cervical lymphadenopathy and otherwise negative CT appearance
of the neck.
3. Advanced chronic cervical spine disc and endplate degeneration at
C5-C6.
4. Chronic main pulmonary artery enlargement compatible with
pulmonary artery hypertension.

## 2018-09-20 ENCOUNTER — Ambulatory Visit (INDEPENDENT_AMBULATORY_CARE_PROVIDER_SITE_OTHER): Payer: BLUE CROSS/BLUE SHIELD | Admitting: Internal Medicine

## 2018-09-20 ENCOUNTER — Other Ambulatory Visit: Payer: Self-pay

## 2018-09-20 ENCOUNTER — Encounter: Payer: Self-pay | Admitting: Internal Medicine

## 2018-09-20 DIAGNOSIS — C50212 Malignant neoplasm of upper-inner quadrant of left female breast: Secondary | ICD-10-CM | POA: Diagnosis not present

## 2018-09-20 DIAGNOSIS — M25552 Pain in left hip: Secondary | ICD-10-CM | POA: Diagnosis not present

## 2018-09-20 DIAGNOSIS — Z1329 Encounter for screening for other suspected endocrine disorder: Secondary | ICD-10-CM

## 2018-09-20 DIAGNOSIS — E559 Vitamin D deficiency, unspecified: Secondary | ICD-10-CM

## 2018-09-20 DIAGNOSIS — M898X9 Other specified disorders of bone, unspecified site: Secondary | ICD-10-CM

## 2018-09-20 DIAGNOSIS — M1A9XX0 Chronic gout, unspecified, without tophus (tophi): Secondary | ICD-10-CM

## 2018-09-20 DIAGNOSIS — R928 Other abnormal and inconclusive findings on diagnostic imaging of breast: Secondary | ICD-10-CM

## 2018-09-20 DIAGNOSIS — Z170 Estrogen receptor positive status [ER+]: Secondary | ICD-10-CM

## 2018-09-20 DIAGNOSIS — E119 Type 2 diabetes mellitus without complications: Secondary | ICD-10-CM

## 2018-09-20 NOTE — Progress Notes (Deleted)
Virtual Visit via Video Note  I connected with@  on 09/20/18 at  9:00 AM EDT by a video enabled telemedicine application and verified that I am speaking with the correct person using two identifiers.  Location patient: home Location provider:work or home office Persons participating in the virtual visit: patient, provider  I discussed the limitations of evaluation and management by telemedicine and the availability of in person appointments. The patient expressed understanding and agreed to proceed.   HPI:    ROS: See pertinent positives and negatives per HPI.  Past Medical History:  Diagnosis Date  . Anemia    iron deficiency  . Arthritis   . Borderline diabetes   . Complication of anesthesia    woke up slowly- after hysterectomy- 2015  . Diabetes mellitus, type II ([place])   . DVT (deep venous thrombosis) ([place]) 2014   left leg  . Family history of breast cancer   . Gout   . HTN (hypertension)   . 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 Menorrhagia    secondary to uterine fibroids  . OSA (obstructive sleep apnea)    07/25/13 HST AHI 33/hr, severe hypoxemia O2 min 42% and 95% of the time <89%  . PE (pulmonary embolism) 2014   bilateral  . Pulmonary artery hypertension ([place])   . Pulmonary nodule    (74m on loeft lower lobe) found on CT scan July 2014, repeat scan Jan 2015 showed less than 429m . Right ovarian cyst    noted 09/2012   . S/P TAH (total abdominal hysterectomy) 06/07/2013  . Trichomoniasis    05/2011     Past Surgical History:  Procedure Laterality Date  . ABDOMINAL HYSTERECTOMY N/A 06/07/2013   Procedure: HYSTERECTOMY ABDOMINAL WITH BIALTERAL SALPINGECTOMY;  Surgeon: VaElveria RoyalsMD;  [facility];  Service: Gynecology;  Laterality: N/A;  . BIOPSY  09/02/2017   Procedure: BIOPSY;  Surgeon: ScWilford CornerMD;  [facility];  Service: Endoscopy;;  . BREAST LUMPECTOMY Left 07/27/2016   BREAST  LUMPECTOMY WITH RADIOACTIVE SEED AND SENTINEL LYMPH NODE BIOPSY (Left)  . BREAST LUMPECTOMY WITH RADIOACTIVE SEED AND SENTINEL LYMPH NODE BIOPSY Left 07/27/2016   Procedure: BREAST LUMPECTOMY WITH RADIOACTIVE SEED AND SENTINEL LYMPH NODE BIOPSY;  Surgeon: FaStark KleinMD;  [facility] Service: General;  Laterality: Left;  . CARDIAC CATHETERIZATION N/A 12/24/2015   Procedure: Right Heart Cath;  Surgeon: JaAdrian ProwsMD;  [facility];  Service: Cardiovascular;  Laterality: N/A;  . CESAREAN SECTION    . COLONOSCOPY    . COLONOSCOPY WITH PROPOFOL N/A 09/02/2017   Procedure: COLONOSCOPY WITH PROPOFOL;  Surgeon: ScWilford CornerMD;  [facility];  Service: Endoscopy;  Laterality: N/A;  . ESOPHAGOGASTRODUODENOSCOPY (EGD) WITH PROPOFOL N/A 09/02/2017   Procedure: ESOPHAGOGASTRODUODENOSCOPY (EGD) WITH PROPOFOL;  Surgeon: ScWilford CornerMD;  [facility];  Service: Endoscopy;  Laterality: N/A;  . IR CV LINE INJECTION  07/27/2017  . POLYPECTOMY  2008   Removal of uterine polyp  . PORT-A-CATH REMOVAL Right 09/09/2017   Procedure: REMOVAL PORT-A-CATH;  Surgeon: ByStark KleinMD;  [facility] Service: General;  Laterality: Right;  . PORTA CATH INSERTION  07/27/2016  . PORTACATH PLACEMENT Right 07/27/2016   Procedure: INSERTION PORT-A-CATH;  Surgeon: FaStark KleinMD;  [facility] Service: General;  Laterality: Right;  . SUBMUCOSAL INJECTION  09/02/2017   Procedure: SUBMUCOSAL INJECTION;  Surgeon: ScWilford Corner  MD;  [facility];  Service: Endoscopy;;  epi injection    Family History  Problem Relation Age of Onset  . Hypertension Mother   . Kidney disease Mother   . Hypertension Father   . Stroke Sister   . Breast cancer Maternal Grandmother        died at 55  . Breast cancer Paternal Grandmother   . Breast cancer Cousin        pat first cousin dx in her 52s  . Hypertension Brother   . Cancer Maternal Aunt         unknown form  . Breast cancer Paternal Aunt   . Colon cancer Other 73       MGMs brother  . Breast cancer Paternal Aunt   . Cancer Other        breast ca in [place]  . Cancer Other        g uncle colon or stomach ca    SOCIAL HX: ***   Current Outpatient Medications:  .  acetaminophen (TYLENOL) 500 MG tablet, Take 1,000 mg by mouth every 6 (six) hours as needed for moderate pain or headache. , Disp: , Rfl:  .  albuterol (PROVENTIL HFA;VENTOLIN HFA) 108 (90 Base) MCG/ACT inhaler, Inhale 2 puffs into the lungs every 6 (six) hours as needed for wheezing or shortness of breath., Disp: 1 Inhaler, Rfl: 12 .  allopurinol (ZYLOPRIM) 300 MG tablet, Take 1 tablet (300 mg total) by mouth daily., Disp: 90 tablet, Rfl: 3 .  anastrozole (ARIMIDEX) 1 MG tablet, Take 1 mg by mouth daily., Disp: , Rfl: 1 .  azelastine (ASTELIN) 0.1 % nasal spray, Place 1 spray into both nostrils 2 (two) times daily. Use in each nostril as directed, Disp: 30 mL, Rfl: 12 .  bismuth subsalicylate (PEPTO-BISMOL) 262 MG/15ML suspension, Take 30 mLs by mouth 4 (four) times daily -  before meals and at bedtime. X 2 weeks, Disp: 473 mL, Rfl: 0 .  chlorpheniramine-HYDROcodone (TUSSIONEX PENNKINETIC ER) 10-8 MG/5ML SUER, Take 5 mLs by mouth at bedtime as needed for cough., Disp: 140 mL, Rfl: 0 .  Cholecalciferol 50000 units capsule, Take 1 capsule (50,000 Units total) by mouth once a week., Disp: 13 capsule, Rfl: 1 .  Colchicine (MITIGARE) 0.6 MG CAPS, Take 1 capsule by mouth as needed. As needed gout flare max # of pills 2 in 24 hours, Disp: 30 capsule, Rfl: 11 .  doxycycline (VIBRA-TABS) 100 MG tablet, Take 1 tablet (100 mg total) by mouth 2 (two) times daily., Disp: 20 tablet, Rfl: 0 .  fluticasone (FLONASE) 50 MCG/ACT nasal spray, Place 2 sprays into both nostrils daily. , Disp: , Rfl:  .  Fluticasone-Salmeterol (ADVAIR DISKUS) 250-50 MCG/DOSE AEPB, Inhale 1 puff into the lungs 2 (two) times daily. Rinse mouth, Disp: 60 each, Rfl:  12 .  ibuprofen (ADVIL,MOTRIN) 800 MG tablet, Take 1 tablet (800 mg total) by mouth every 8 (eight) hours as needed., Disp: 90 tablet, Rfl: 0 .  loratadine (CLARITIN) 10 MG tablet, Take 10 mg by mouth daily., Disp: , Rfl:  .  metFORMIN (GLUCOPHAGE) 500 MG tablet, Take 1 tablet (500 mg total) by mouth 2 (two) times daily with a meal., Disp: 60 tablet, Rfl: 2 .  metoprolol tartrate (LOPRESSOR) 25 MG tablet, Take 25 mg by mouth daily., Disp: , Rfl:  .  Multiple Vitamin (MULTIVITAMIN) tablet, Take 1 tablet by mouth daily., Disp: , Rfl:  .  nystatin (MYCOSTATIN) 100000 UNIT/ML suspension, Take 5 mLs (  500,000 Units total) by mouth 4 (four) times daily. X 7-10 days, Disp: 60 mL, Rfl: 0 .  omeprazole (PRILOSEC) 40 MG capsule, Take 1 capsule (40 mg total) by mouth daily. 30 minutes before dinner, Disp: 90 capsule, Rfl: 3 .  OPSUMIT 10 MG TABS, Take 10 mg by mouth daily., Disp: , Rfl: 8 .  Polyethyl Glycol-Propyl Glycol (SYSTANE OP), Place 1 drop into both eyes daily as needed (dry eyes). , Disp: , Rfl:  .  sildenafil (REVATIO) 20 MG tablet, Take 40 mg by mouth 3 (three) times daily. , Disp: , Rfl:  .  spironolactone (ALDACTONE) 25 MG tablet, Take 1 tablet (25 mg total) by mouth daily., Disp: 90 tablet, Rfl: 3 .  telmisartan (MICARDIS) 40 MG tablet, Take 1 tablet (40 mg total) by mouth daily., Disp: 90 tablet, Rfl: 3 .  tinidazole (TINDAMAX) 500 MG tablet, Take 1 tablet (500 mg total) by mouth 2 (two) times daily with a meal., Disp: 28 tablet, Rfl: 0 .  torsemide (DEMADEX) 20 MG tablet, Take 20 mg by mouth daily as needed. , Disp: , Rfl:  .  triamcinolone cream (KENALOG) 0.1 %, Apply 1 application topically daily as needed (leg discoloration)., Disp: , Rfl:  .  UPTRAVI 400 MCG TABS, Take 400 mcg by mouth 2 (two) times daily. , Disp: , Rfl: 10 No current facility-administered medications for this visit.   Facility-Administered Medications Ordered in Other Visits:  .  heparin lock flush 100 unit/mL, 500  Units, Intracatheter, Once PRN, Nicholas Lose, MD .  sodium chloride flush (NS) 0.9 % injection 10 mL, 10 mL, Intracatheter, PRN, Nicholas Lose, MD  EXAM:  VITALS per patient if applicable:  GENERAL: alert, oriented, appears well and in no acute distress  HEENT: atraumatic, conjunttiva clear, no obvious abnormalities on inspection of external nose and ears  NECK: normal movements of the head and neck  LUNGS: on inspection no signs of respiratory distress, breathing rate appears normal, no obvious gross SOB, gasping or wheezing  CV: no obvious cyanosis  MS: moves all visible extremities without noticeable abnormality  PSYCH/NEURO: pleasant and cooperative, no obvious depression or anxiety, speech and thought processing grossly intact  ASSESSMENT AND PLAN:  Discussed the following assessment and plan:  No diagnosis found.     I discussed the assessment and treatment plan with the patient. The patient was provided an opportunity to ask questions and all were answered. The patient agreed with the plan and demonstrated an understanding of the instructions.   The patient was advised to call back or seek an in-person evaluation if the symptoms worsen or if the condition fails to improve as anticipated.   Nino Glow McLean-Scocuzza, MD

## 2018-09-20 NOTE — Progress Notes (Signed)
Virtual Visit via Video Note  I connected with [name]  on 09/20/18 at  9:17 AM EDT by a video enabled telemedicine application and verified that I am speaking with the correct person using two identifiers.  Location patient: home Location provider:work  Persons participating in the virtual visit: patient, provider  I discussed the limitations of evaluation and management by telemedicine and the availability of in person appointments. The patient expressed understanding and agreed to proceed.   HPI: 1. C/o joint pain diffuse feet esp left foot and toe and left hip but also knee and was in toe most recently with history of gout. She is not taking allopurinol 300 mg daily.  Left hip pain new w/in the last 1 week she is unable to walk pain is 7/10. Denies numbness/tingling. She reports previously triggers seafood and beef ribs but has not had in 3 weeks and normally gout flares after a couple of days and not weeks later. She takes ibuprofen 800 mg 1/2 to 1 pill daily to bid which helps and has colchicine as well. She reports she is having trouble bearing weight on her left hip and may use some crutches her sister has   Due to above sx's she has been out of work since 09/16/18 an dwas supposed to work all days up until today and is off 09/22/18 so needs FMLA paperwork filled out   2. 06/2018 breast calcifications right with h/o left breast cancer bx scheduled 09/27/18 [facility] for bx   new 0.8 cm calcifications Right breast c/w malignancy-[facility]   ROS: See pertinent positives and negatives per HPI.  Past Medical History:  Diagnosis Date  . Anemia    iron deficiency  . Arthritis   . Borderline diabetes   . Complication of anesthesia    woke up slowly- after hysterectomy- 2015  . Diabetes mellitus, type II ([place])   . DVT (deep venous thrombosis) ([place]) 2014   left leg  . Family history of breast cancer   . Gout   . HTN (hypertension)   . 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 Menorrhagia    secondary to uterine fibroids  . OSA (obstructive sleep apnea)    07/25/13 HST AHI 33/hr, severe hypoxemia O2 min 42% and 95% of the time <89%  . PE (pulmonary embolism) 2014   bilateral  . Pulmonary artery hypertension ([place])   . Pulmonary nodule    (69m on loeft lower lobe) found on CT scan July 2014, repeat scan Jan 2015 showed less than 465m . Right ovarian cyst    noted 09/2012   . S/P TAH (total abdominal hysterectomy) 06/07/2013  . Trichomoniasis    05/2011     Past Surgical History:  Procedure Laterality Date  . ABDOMINAL HYSTERECTOMY N/A 06/07/2013   Procedure: HYSTERECTOMY ABDOMINAL WITH BIALTERAL SALPINGECTOMY;  Surgeon: VaElveria RoyalsMD;  [facility];  Service: Gynecology;  Laterality: N/A;  . BIOPSY  09/02/2017   Procedure: BIOPSY;  Surgeon: ScWilford CornerMD;  [facility];  Service: Endoscopy;;  . BREAST LUMPECTOMY Left 07/27/2016   BREAST LUMPECTOMY WITH RADIOACTIVE SEED AND SENTINEL LYMPH NODE BIOPSY (Left)  . BREAST LUMPECTOMY WITH RADIOACTIVE SEED AND SENTINEL LYMPH NODE BIOPSY Left 07/27/2016   Procedure: BREAST LUMPECTOMY WITH RADIOACTIVE SEED AND SENTINEL LYMPH NODE BIOPSY;  Surgeon: FaStark KleinMD;  [facility] Service: General;  Laterality: Left;  . CARDIAC CATHETERIZATION N/A 12/24/2015   Procedure: Right  Heart Cath;  Surgeon: [name], MD;  Location: [place] [facility];  Service: Cardiovascular;  Laterality: N/A;  . CESAREAN SECTION    . COLONOSCOPY    . COLONOSCOPY WITH PROPOFOL N/A 09/02/2017   Procedure: COLONOSCOPY WITH PROPOFOL;  Surgeon: Wilford Corner, MD;  [facility];  Service: Endoscopy;  Laterality: N/A;  . ESOPHAGOGASTRODUODENOSCOPY (EGD) WITH PROPOFOL N/A 09/02/2017   Procedure: ESOPHAGOGASTRODUODENOSCOPY (EGD) WITH PROPOFOL;  Surgeon: Wilford Corner, MD;  [facility];  Service: Endoscopy;  Laterality: N/A;  . IR CV LINE INJECTION  07/27/2017  .  POLYPECTOMY  2008   Removal of uterine polyp  . PORT-A-CATH REMOVAL Right 09/09/2017   Procedure: REMOVAL PORT-A-CATH;  Surgeon: Stark Klein, MD;  Location: [place];  Service: General;  Laterality: Right;  . PORTA CATH INSERTION  07/27/2016  . PORTACATH PLACEMENT Right 07/27/2016   Procedure: INSERTION PORT-A-CATH;  Surgeon: Stark Klein, MD;  Location: [facility];  Service: General;  Laterality: Right;  . SUBMUCOSAL INJECTION  09/02/2017   Procedure: SUBMUCOSAL INJECTION;  Surgeon: Wilford Corner, MD;  [facility];  Service: Endoscopy;;  epi injection    Family History  Problem Relation Age of Onset  . Hypertension Mother   . Kidney disease Mother   . Hypertension Father   . Stroke Sister   . Breast cancer Maternal Grandmother        died at 3  . Breast cancer Paternal Grandmother   . Breast cancer Cousin        pat first cousin dx in her 5s  . Hypertension Brother   . Cancer Maternal Aunt        unknown form  . Breast cancer Paternal Aunt   . Colon cancer Other 53       MGMs brother  . Breast cancer Paternal Aunt   . Cancer Other        breast ca in [place]  . 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  acetaminophen (TYLENOL) 500 MG tablet, Take 1,000 mg by mouth every 6 (six) hours as needed for moderate pain or headache. , Disp: , Rfl:  .  albuterol (PROVENTIL HFA;VENTOLIN HFA) 108 (90 Base) MCG/ACT inhaler, Inhale 2 puffs into the lungs every 6 (six) hours as needed for wheezing or shortness of breath., Disp: 1 Inhaler, Rfl: 12 .  allopurinol (ZYLOPRIM) 300 MG tablet, Take 1 tablet (300 mg total) by mouth daily., Disp: 90 tablet, Rfl: 3 .  anastrozole (ARIMIDEX) 1 MG tablet, Take 1 mg by mouth daily., Disp: , Rfl: 1 .   azelastine (ASTELIN) 0.1 % nasal spray, Place 1 spray into both nostrils 2 (two) times daily. Use in each nostril as directed, Disp: 30 mL, Rfl: 12 .  bismuth subsalicylate (PEPTO-BISMOL) 262 MG/15ML suspension, Take 30 mLs by mouth 4 (four) times daily -  before meals and at bedtime. X 2 weeks, Disp: 473 mL, Rfl: 0 .  chlorpheniramine-HYDROcodone (TUSSIONEX PENNKINETIC ER) 10-8 MG/5ML SUER, Take 5 mLs by mouth at bedtime as needed for cough., Disp: 140 mL, Rfl: 0 .  Cholecalciferol 50000 units capsule, Take 1 capsule (50,000 Units total) by mouth once a week., Disp: 13 capsule, Rfl: 1 .  Colchicine (MITIGARE) 0.6 MG CAPS, Take 1 capsule by mouth as needed. As needed gout flare max # of pills 2 in 24 hours, Disp: 30 capsule, Rfl: 11 .  doxycycline (VIBRA-TABS) 100 MG tablet, Take 1 tablet (100 mg total) by mouth 2 (two) times daily., Disp: 20 tablet, Rfl: 0 .  fluticasone (FLONASE) 50 MCG/ACT nasal spray, Place 2 sprays into both nostrils daily. , Disp: , Rfl:  .  Fluticasone-Salmeterol (ADVAIR DISKUS) 250-50 MCG/DOSE AEPB, Inhale 1 puff into the lungs 2 (two) times daily. Rinse mouth, Disp: 60 each, Rfl: 12 .  ibuprofen (ADVIL,MOTRIN) 800 MG tablet, Take 1 tablet (800 mg total) by mouth every 8 (eight) hours as needed., Disp: 90 tablet, Rfl: 0 .  loratadine (CLARITIN) 10 MG tablet, Take 10 mg by mouth daily., Disp: , Rfl:  .  metFORMIN (GLUCOPHAGE) 500 MG tablet, Take 1 tablet (500 mg total) by mouth 2 (two) times daily with a meal., Disp: 60 tablet, Rfl: 2 .  metoprolol tartrate (LOPRESSOR) 25 MG tablet, Take 25 mg by mouth daily., Disp: , Rfl:  .  Multiple Vitamin (MULTIVITAMIN) tablet, Take 1 tablet by mouth daily., Disp: , Rfl:  .  nystatin (MYCOSTATIN) 100000 UNIT/ML suspension, Take 5 mLs (500,000 Units total) by mouth 4 (four) times daily. X 7-10 days, Disp: 60 mL, Rfl: 0 .  omeprazole (PRILOSEC) 40 MG capsule, Take 1 capsule (40 mg total) by mouth daily. 30 minutes before dinner, Disp: 90  capsule, Rfl: 3 .  OPSUMIT 10 MG TABS, Take 10 mg by mouth daily., Disp: , Rfl: 8 .  Polyethyl Glycol-Propyl Glycol (SYSTANE OP), Place 1 drop into both eyes daily as needed (dry eyes). , Disp: , Rfl:  .  sildenafil (REVATIO) 20 MG tablet, Take 40 mg by mouth 3 (three) times daily. , Disp: , Rfl:  .  spironolactone (ALDACTONE) 25 MG tablet, Take 1 tablet (25 mg total) by mouth daily., Disp: 90 tablet, Rfl: 3 .  telmisartan (MICARDIS) 40 MG tablet, Take 1 tablet (40 mg total) by mouth daily., Disp: 90 tablet, Rfl: 3 .  tinidazole (TINDAMAX) 500 MG tablet, Take 1 tablet (500 mg total) by mouth 2 (two) times daily with a meal., Disp: 28 tablet, Rfl: 0 .  torsemide (DEMADEX) 20 MG tablet, Take 20 mg by mouth daily as needed. , Disp: , Rfl:  .  triamcinolone cream (KENALOG) 0.1 %, Apply 1 application topically daily as needed (leg discoloration)., Disp: , Rfl:  .  UPTRAVI 400 MCG TABS, Take 400 mcg by mouth 2 (two) times daily. , Disp: , Rfl: 10 No current facility-administered medications for this visit.   Facility-Administered Medications Ordered in Other Visits:  .  heparin lock flush 100 unit/mL, 500 Units, Intracatheter, Once PRN, Nicholas Lose, MD .  sodium chloride flush (NS) 0.9 % injection 10 mL, 10 mL, Intracatheter, PRN, Nicholas Lose, MD  EXAM:  VITALS per patient if applicable:  GENERAL: alert, oriented, appears well and in no acute distress  HEENT: atraumatic, conjunttiva clear, no obvious abnormalities on inspection of external nose and ears  NECK: normal movements of the head and neck  LUNGS: on inspection no signs of respiratory distress, breathing rate appears normal, no obvious gross SOB, gasping or wheezing  CV: no obvious cyanosis  MS: moves all visible extremities without noticeable abnormality  PSYCH/NEURO: pleasant and cooperative, no obvious depression or anxiety, speech and thought processing grossly intact  ASSESSMENT AND PLAN:  Discussed the following  assessment and plan:  Left  hip pain - Plan: DG HIP UNILAT WITH PELVIS 2-3 VIEWS LEFT r/o OA  Bone pain - Plan: consider bone vs PET scan h/o breast cancer left and abnormal mammogram right breast 06/2018 will CC Dr. Lindi Adie to get his opinion   Chronic gout without tophus, unspecified cause, unspecified site - Plan: prn Colchicine, prn ibuprofen. When pain improves rec she take allopurinol 300 mg daily last uric acid 8.7 03/21/18 then 7.4 05/19/18  -consider rheumatology   Malignant neoplasm of upper-inner quadrant of left breast in female, estrogen receptor positive ([place]) -  Abnormal mammogram of right breast - Plan:  -breast bx 09/27/2018 [facility] Due to insurance pt needs H/[facility] MD at [facility] until 03/2018 then wants to transfer care back to Dr. Lindi Adie  HM sch fasting labs [facility] asap see labs from 09/14/18 magnesium low 1.2 a1c 6.4, AP 242 hight and increasing  Flu utd  MMR immune Hep A vx given needs repeat 03/22/19 Hep B new vaccine 1/2 will need repeat  In future  Tdap utd Consider shingrix in future and pna 23  Hep C negative11/29/17  Pap smear  -s/p TAH(w/o cervix only ovaries intact)DUB 2/2 fibroids, adenomyosis  -follows with Dr. Mody[facility] pap neg 6/2010last saw 01/2017 Dr. Benjie Karvonen -obtained records 04/14/17 visit  [facility] 06/2018 abnormal with new right breast cal. H/o left breast cancer    Colonoscopy/EGD had 6/6/19[facility] Dr.  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 Lacinda Axon cath lab 06/02/2018 [place], worsening HD status on max oral therapy selexipag, sildenafil, macitentan will rec iv remodulin  I discussed the assessment and treatment plan with the patient. The patient was provided an opportunity to ask questions and all were answered. The patient agreed with the plan and demonstrated an understanding of the instructions.   The patient was advised to call back or seek an in-person evaluation if the symptoms worsen or if the condition fails to improve as anticipated.  Time spent 15 minutes  [name], MD

## 2018-09-21 ENCOUNTER — Telehealth: Payer: Self-pay | Admitting: *Deleted

## 2018-09-21 NOTE — Telephone Encounter (Signed)
>>  Sep 21, 2018 11:40 AM Scherrie Gerlach wrote: Reason for CRM: pt needs Dr Linus Orn to know where she can go for xray. [facility]  [phone]   or [place]  [phone]   Can we put Xray hip and bone scan in here asap  Thanks [place]

## 2018-09-21 NOTE — Telephone Encounter (Signed)
Copied from [place] [id number]. Topic: Referral - Status >> Sep 21, 2018 11:40 AM Scherrie Gerlach wrote: Reason for CRM: pt needs Dr Linus Orn to know where she can go for xray. [facility]  [phone]   or [place]  [phone]

## 2018-09-22 ENCOUNTER — Telehealth: Payer: Self-pay | Admitting: Internal Medicine

## 2018-09-22 NOTE — Telephone Encounter (Signed)
Dr.McNatt is the breast surgeon  Dr.Alexandra Marcello Moores is a medical oncologist for breast cancer  Does she want referral? This is who Dr. Lindi Adie recommends?  Is bone scan or Xray scheduled?   [place]

## 2018-09-23 ENCOUNTER — Other Ambulatory Visit: Payer: Self-pay | Admitting: Internal Medicine

## 2018-09-23 DIAGNOSIS — M898X9 Other specified disorders of bone, unspecified site: Secondary | ICD-10-CM

## 2018-09-23 DIAGNOSIS — C50919 Malignant neoplasm of unspecified site of unspecified female breast: Secondary | ICD-10-CM

## 2018-09-23 NOTE — Telephone Encounter (Signed)
Xray is in for left hip and bone scan  See under imaging

## 2018-09-23 NOTE — Telephone Encounter (Signed)
I can not order x-rays, I do not have access to do it. I'm happy to send the order once it is in.

## 2018-09-27 ENCOUNTER — Telehealth: Payer: Self-pay

## 2018-09-27 DIAGNOSIS — G4733 Obstructive sleep apnea (adult) (pediatric): Secondary | ICD-10-CM | POA: Diagnosis not present

## 2018-09-27 DIAGNOSIS — R921 Mammographic calcification found on diagnostic imaging of breast: Secondary | ICD-10-CM | POA: Diagnosis not present

## 2018-09-27 DIAGNOSIS — N6031 Fibrosclerosis of right breast: Secondary | ICD-10-CM | POA: Diagnosis not present

## 2018-09-27 DIAGNOSIS — C50212 Malignant neoplasm of upper-inner quadrant of left female breast: Secondary | ICD-10-CM | POA: Diagnosis not present

## 2018-09-27 DIAGNOSIS — R069 Unspecified abnormalities of breathing: Secondary | ICD-10-CM | POA: Diagnosis not present

## 2018-09-27 NOTE — Telephone Encounter (Signed)
FMLA, nothing was on there pertaining GOUT. Please advise. She also need a work to go back Friday.

## 2018-09-27 NOTE — Telephone Encounter (Signed)
Copied from [place] [id number]. Topic: Quick Communication - See Telephone Encounter >> Sep 27, 2018 10:32 AM [place] wrote: CRM for notification. See Telephone encounter for: 09/27/18.pt is wanting [name] to call her concerning her leave note that Dr Olivia Mackie wrote for her. She also needs a retrun to work note. #cb [phone]

## 2018-09-28 ENCOUNTER — Encounter: Payer: Self-pay | Admitting: Internal Medicine

## 2018-09-28 NOTE — Telephone Encounter (Signed)
Patient called back inquiring about FMLA paperwork and when she should return to work.  Patient also wanted PCP to be informed that she needs FMLA paperwork to state diagnosis if different than gout so that she would be covered from the time she was out of work.  I spoke with PCP.  PCP aware of diagnosis needed for FMLA paperwork.   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  PCP also advised that she is still working on the doctor's note but note will be available in patient's MyChart today.  Patient aware.

## 2018-09-28 NOTE — Telephone Encounter (Signed)
Note in chart to return to work  FMLA updated to reflect bone pain could be related to mets from breast cancer arthritis or gout pending Xray hip and bone scan  Referrals pending to H/o, breast surgery [place] as well    Melissa  -where are we on referrals to  1.hematology/oncology 2. breast surgery  3. When is Xray scheduled?  4. Also bone scan?   [place]

## 2018-09-29 ENCOUNTER — Telehealth: Payer: Self-pay | Admitting: Internal Medicine

## 2018-09-29 NOTE — Telephone Encounter (Signed)
[name] called from [employer] due to [facility].  [name] is confused on the reason on her leave and needs verification. Office closes at 3pm and will not be open till Monday 7/6 Call back# [phone] Ext [phone]

## 2018-09-29 NOTE — Telephone Encounter (Signed)
She was out for bone pain which I am working up for gout/metastic breast cancer and arthritis  What else do they need to know?  She has Xray, bone scan and referrals to oncology and breast surgery pending and will need to go to these appts as well    [place]

## 2018-10-04 ENCOUNTER — Telehealth: Payer: Self-pay | Admitting: Internal Medicine

## 2018-10-04 NOTE — Telephone Encounter (Signed)
Did Xray and bone scan get scheduled?   [place]

## 2018-10-07 DIAGNOSIS — Z170 Estrogen receptor positive status [ER+]: Secondary | ICD-10-CM | POA: Diagnosis not present

## 2018-10-07 DIAGNOSIS — C50212 Malignant neoplasm of upper-inner quadrant of left female breast: Secondary | ICD-10-CM | POA: Diagnosis not present

## 2018-10-10 NOTE — Telephone Encounter (Signed)
Please try again Tues  [place]

## 2018-10-10 NOTE — Telephone Encounter (Signed)
I've tried calling her multiple times for this. I sent her a mychart message as well this morning to let me know where she wants her bone scan since [facility] does not do bone scans.

## 2018-10-11 DIAGNOSIS — C50219 Malignant neoplasm of upper-inner quadrant of unspecified female breast: Secondary | ICD-10-CM | POA: Diagnosis not present

## 2018-10-12 DIAGNOSIS — I10 Essential (primary) hypertension: Secondary | ICD-10-CM | POA: Diagnosis not present

## 2018-10-12 DIAGNOSIS — R748 Abnormal levels of other serum enzymes: Secondary | ICD-10-CM | POA: Diagnosis not present

## 2018-10-12 DIAGNOSIS — C50219 Malignant neoplasm of upper-inner quadrant of unspecified female breast: Secondary | ICD-10-CM | POA: Diagnosis not present

## 2018-10-15 ENCOUNTER — Ambulatory Visit (INDEPENDENT_AMBULATORY_CARE_PROVIDER_SITE_OTHER): Payer: BLUE CROSS/BLUE SHIELD

## 2018-10-15 ENCOUNTER — Other Ambulatory Visit: Payer: Self-pay

## 2018-10-15 ENCOUNTER — Encounter (HOSPITAL_COMMUNITY): Payer: Self-pay

## 2018-10-15 ENCOUNTER — Ambulatory Visit (HOSPITAL_COMMUNITY)
Admission: EM | Admit: 2018-10-15 | Discharge: 2018-10-15 | Disposition: A | Discharge status: Home / Self Care | Payer: BLUE CROSS/BLUE SHIELD | Attending: Family Medicine | Admitting: Family Medicine

## 2018-10-15 DIAGNOSIS — R52 Pain, unspecified: Secondary | ICD-10-CM | POA: Diagnosis not present

## 2018-10-15 DIAGNOSIS — M25552 Pain in left hip: Secondary | ICD-10-CM | POA: Insufficient documentation

## 2018-10-15 DIAGNOSIS — M109 Gout, unspecified: Secondary | ICD-10-CM

## 2018-10-15 LAB — URIC ACID: Uric Acid, Serum: 10.1 mg/dL — ABNORMAL HIGH (ref 2.5–7.1)

## 2018-10-15 LAB — C-REACTIVE PROTEIN: CRP: 1.1 mg/dL — ABNORMAL HIGH (ref <1.0)

## 2018-10-15 LAB — SEDIMENTATION RATE: Sed Rate: 55 mm/hr — ABNORMAL HIGH (ref 0–22)

## 2018-10-15 MED ORDER — KETOROLAC TROMETHAMINE 60 MG/2ML IM SOLN
60.0000 mg | Freq: Once | INTRAMUSCULAR | Status: AC
  Administered 2018-10-15: 60 mg via INTRAMUSCULAR

## 2018-10-15 MED ORDER — KETOROLAC TROMETHAMINE 60 MG/2ML IM SOLN
INTRAMUSCULAR | Status: AC
  Filled 2018-10-15: qty 2

## 2018-10-15 NOTE — ED Provider Notes (Signed)
[name]    CSN: [id number] Arrival date & time: 10/15/18  1520     History   Chief Complaint Chief Complaint  Patient presents with  . Hip Pain    left     HPI [name] is a 53 y.o. female with history of diabetes, gout, arthritis, DVT, hypertension, breast cancer in remission presenting for 1 day course of sudden onset left hip pain.  Patient is has happened once before, 3 weeks ago, for which she took 800 mg ibuprofen 3 times daily for 1 week and had complete resolution of pain.  Patient states that began suddenly this morning when she woke up.  Patient has had difficulty bearing weight.  Denies lower extremity paresthesias, numbness, weakness.  Saddle area anesthesia, urinary or fecal incontinence.  Patient denies fall, trauma to the area, wound.  Patient tried 200 mg ibuprofen with minimal relief.  Reports compliance with allopurinol.   Past Medical History:  Diagnosis Date  . Anemia    iron deficiency  . Arthritis   . Borderline diabetes   . Complication of anesthesia    woke up slowly- after hysterectomy- 2015  . Diabetes mellitus, type II ([place])   . DVT (deep venous thrombosis) ([place]) 2014   left leg  . Family history of breast cancer   . Gout   . HTN (hypertension)   . 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 Menorrhagia    secondary to uterine fibroids  . OSA (obstructive sleep apnea)    07/25/13 HST AHI 33/hr, severe hypoxemia O2 min 42% and 95% of the time <89%  . PE (pulmonary embolism) 2014   bilateral  . Pulmonary artery hypertension ([place])   . Pulmonary nodule    (33m on loeft lower lobe) found on CT scan July 2014, repeat scan Jan 2015 showed less than 49m . Right ovarian cyst    noted 09/2012   . S/P TAH (total abdominal hysterectomy) 06/07/2013  . Trichomoniasis    05/2011     Patient Active Problem List   Diagnosis Date Noted  . Hypomagnesemia 09/20/2018  . Chronic cough 06/21/2018  .  Cirrhosis of liver with ascites (HCAmherstdale12/24/2019  . Leg edema 03/22/2018  . Vitamin D deficiency 03/01/2018  . Periumbilical abdominal pain 03/01/2018  . Hypoxia 03/01/2018  . Reactive airway disease without complication 1240/98/1191. H/O medication noncompliance 03/01/2018  . H. pylori infection 12/30/2017  . Leg cramps 12/30/2017  . Fatty liver 09/03/2017  . Special screening for malignant neoplasms, colon 09/02/2017  . Gallbladder polyp 08/30/2017  . Acute nonintractable headache 06/23/2017  . Bilious vomiting with nausea 06/23/2017  . Early satiety 06/23/2017  . Allergic rhinitis 06/23/2017  . OSA on CPAP 06/06/2017  . DM2 (diabetes mellitus, type 2) (HCWolf Lake03/12/2017  . Iron deficiency anemia 06/06/2017  . Osteoarthritis 06/06/2017  . Cardiomegaly 06/06/2017  . Ectasis aorta (HCSequim03/12/2017  . Stasis dermatitis of both legs 06/06/2017  . HLD (hyperlipidemia) 06/06/2017  . Pulmonary HTN (HCCrowell02/27/2019  . Essential hypertension 05/26/2017  . Gout 05/24/2017  . Chemotherapy-induced peripheral neuropathy (HCEast Brooklyn08/09/2016  . Port catheter in place 09/15/2016  . Breast cancer of upper-inner quadrant of left female breast (HCBernville04/30/2018  . Genetic testing 03/25/2016  . Family history of breast cancer   . Malignant neoplasm of upper-inner quadrant of left breast in female, estrogen receptor positive (HCOakhurst12/10/2015  . Chronic respiratory failure with hypoxia (HCGroveton11/05/2015  .  Dyspnea on exertion 12/09/2015  . S/P TAH (total abdominal hysterectomy) 06/07/2013  . Acute blood loss anemia 05/14/2013  . Abnormal uterine bleeding 10/13/2012  . Fibroids 10/13/2012  . Anemia 10/13/2012  . VTE (venous thromboembolism) 10/04/2012  . Pulmonary nodule 10/04/2012  . Hypokalemia 10/04/2012    Past Surgical History:  Procedure Laterality Date  . ABDOMINAL HYSTERECTOMY N/A 06/07/2013   Procedure: HYSTERECTOMY ABDOMINAL WITH BIALTERAL SALPINGECTOMY;  Surgeon: Elveria Royals, MD;   Location: [facility];  Service: Gynecology;  Laterality: N/A;  . BIOPSY  09/02/2017   Procedure: BIOPSY;  Surgeon: Wilford Corner, MD;  Location: [facility];  Service: Endoscopy;;  . BREAST LUMPECTOMY Left 07/27/2016   BREAST LUMPECTOMY WITH RADIOACTIVE SEED AND SENTINEL LYMPH NODE BIOPSY (Left)  . BREAST LUMPECTOMY WITH RADIOACTIVE SEED AND SENTINEL LYMPH NODE BIOPSY Left 07/27/2016   Procedure: BREAST LUMPECTOMY WITH RADIOACTIVE SEED AND SENTINEL LYMPH NODE BIOPSY;  Surgeon: Stark Klein, MD;  Location: [facility];  Service: General;  Laterality: Left;  . CARDIAC CATHETERIZATION N/A 12/24/2015   Procedure: Right Heart Cath;  Surgeon: [name], MD;  Location: [facility];  Service: Cardiovascular;  Laterality: N/A;  . CESAREAN SECTION    . COLONOSCOPY    . COLONOSCOPY WITH PROPOFOL N/A 09/02/2017   Procedure: COLONOSCOPY WITH PROPOFOL;  Surgeon: Wilford Corner, MD;  [facility];  Service: Endoscopy;  Laterality: N/A;  . ESOPHAGOGASTRODUODENOSCOPY (EGD) WITH PROPOFOL N/A 09/02/2017   Procedure: ESOPHAGOGASTRODUODENOSCOPY (EGD) WITH PROPOFOL;  Surgeon: Wilford Corner, MD;  [facility];  Service: Endoscopy;  Laterality: N/A;  . IR CV LINE INJECTION  07/27/2017  . POLYPECTOMY  2008   Removal of uterine polyp  . PORT-A-CATH REMOVAL Right 09/09/2017   Procedure: REMOVAL PORT-A-CATH;  Surgeon: Stark Klein, MD;  Location: [facility];  Service: General;  Laterality: Right;  . PORTA CATH INSERTION  07/27/2016  . PORTACATH PLACEMENT Right 07/27/2016   Procedure: INSERTION PORT-A-CATH;  Surgeon: Stark Klein, MD;  Location: [facility];  Service: General;  Laterality: Right;  . SUBMUCOSAL INJECTION  09/02/2017   Procedure: SUBMUCOSAL INJECTION;  Surgeon: Wilford Corner, MD;  [facility];  Service: Endoscopy;;  epi injection    OB History    Gravida  3   Para  1   Term  1   Preterm  0   AB  2   Living  1     SAB  1   TAB  1   Ectopic  0    Multiple  0   Live Births               Home Medications    Prior to Admission medications   Medication Sig Start Date End Date Taking? Authorizing Provider  acetaminophen (TYLENOL) 500 MG tablet Take 1,000 mg by mouth every 6 (six) hours as needed for moderate pain or headache.     [provider]  albuterol (PROVENTIL HFA;VENTOLIN HFA) 108 (90 Base) MCG/ACT inhaler Inhale 2 puffs into the lungs every 6 (six) hours as needed for wheezing or shortness of breath. 03/01/18   McLean-Scocuzza, Nino Glow, MD  allopurinol (ZYLOPRIM) 300 MG tablet Take 1 tablet (300 mg total) by mouth daily. 03/01/18   McLean-Scocuzza, Nino Glow, MD  anastrozole (ARIMIDEX) 1 MG tablet Take 1 mg by mouth daily. 06/27/16   [provider]  azelastine (ASTELIN) 0.1 % nasal spray Place 1 spray into both nostrils 2 (two) times daily. Use in each nostril as directed 06/21/18  McLean-Scocuzza, Nino Glow, MD  bismuth subsalicylate (PEPTO-BISMOL) 262 MG/15ML suspension Take 30 mLs by mouth 4 (four) times daily -  before meals and at bedtime. X 2 weeks 03/01/18   McLean-Scocuzza, Nino Glow, MD  chlorpheniramine-HYDROcodone [facility] PENNKINETIC ER) 10-8 MG/5ML SUER Take 5 mLs by mouth at bedtime as needed for cough. 06/21/18   McLean-Scocuzza, Nino Glow, MD  Cholecalciferol 50000 units capsule Take 1 capsule (50,000 Units total) by mouth once a week. 12/31/17   McLean-Scocuzza, Nino Glow, MD  Colchicine (MITIGARE) 0.6 MG CAPS Take 1 capsule by mouth as needed. As needed gout flare max # of pills 2 in 24 hours 06/21/18   McLean-Scocuzza, Nino Glow, MD  doxycycline (VIBRA-TABS) 100 MG tablet Take 1 tablet (100 mg total) by mouth 2 (two) times daily. 06/21/18   McLean-Scocuzza, Nino Glow, MD  fluticasone (FLONASE) 50 MCG/ACT nasal spray Place 2 sprays into both nostrils daily.     [provider]  Fluticasone-Salmeterol (ADVAIR DISKUS) 250-50 MCG/DOSE AEPB Inhale 1 puff into the lungs 2 (two) times daily. Rinse mouth  03/21/18   McLean-Scocuzza, Nino Glow, MD  ibuprofen (ADVIL,MOTRIN) 800 MG tablet Take 1 tablet (800 mg total) by mouth every 8 (eight) hours as needed. 05/19/18   McLean-Scocuzza, Nino Glow, MD  loratadine (CLARITIN) 10 MG tablet Take 10 mg by mouth daily.    [provider]  metFORMIN (GLUCOPHAGE) 500 MG tablet Take 1 tablet (500 mg total) by mouth 2 (two) times daily with a meal. 02/03/16   Demetrios Loll, MD  metoprolol tartrate (LOPRESSOR) 25 MG tablet Take 25 mg by mouth daily.    [provider]  Multiple Vitamin (MULTIVITAMIN) tablet Take 1 tablet by mouth daily.    [provider]  nystatin (MYCOSTATIN) 100000 UNIT/ML suspension Take 5 mLs (500,000 Units total) by mouth 4 (four) times daily. X 7-10 days 06/21/18   McLean-Scocuzza, Nino Glow, MD  omeprazole (PRILOSEC) 40 MG capsule Take 1 capsule (40 mg total) by mouth daily. 30 minutes before dinner 12/30/17   McLean-Scocuzza, Nino Glow, MD  OPSUMIT 10 MG TABS Take 10 mg by mouth daily. 06/03/16   [provider]  Polyethyl Glycol-Propyl Glycol (SYSTANE OP) Place 1 drop into both eyes daily as needed (dry eyes).     [provider]  sildenafil (REVATIO) 20 MG tablet Take 40 mg by mouth 3 (three) times daily.  05/27/16   [provider]  spironolactone (ALDACTONE) 25 MG tablet Take 1 tablet (25 mg total) by mouth daily. 08/19/18   McLean-Scocuzza, Nino Glow, MD  telmisartan (MICARDIS) 40 MG tablet Take 1 tablet (40 mg total) by mouth daily. 08/30/17   McLean-Scocuzza, Nino Glow, MD  tinidazole (TINDAMAX) 500 MG tablet Take 1 tablet (500 mg total) by mouth 2 (two) times daily with a meal. 06/21/18   McLean-Scocuzza, Nino Glow, MD  torsemide (DEMADEX) 20 MG tablet Take 20 mg by mouth daily as needed.     [provider]  triamcinolone cream (KENALOG) 0.1 % Apply 1 application topically daily as needed (leg discoloration).    [provider]  UPTRAVI 400 MCG TABS Take 400 mcg by mouth 2 (two) times  daily.  06/10/16   [provider]  prochlorperazine (COMPAZINE) 10 MG tablet Take 1 tablet (10 mg total) by mouth every 6 (six) hours as needed (Nausea or vomiting). 08/04/16 08/17/16  Nicholas Lose, MD    Family History Family History  Problem Relation Age of Onset  . Hypertension Mother   .  Kidney disease Mother   . Hypertension Father   . Stroke Sister   . Breast cancer Maternal Grandmother        died at 87  . Breast cancer Paternal Grandmother   . Breast cancer Cousin        pat first cousin dx in her 42s  . Hypertension Brother   . Cancer Maternal Aunt        unknown form  . Breast cancer Paternal Aunt   . Colon cancer Other 15       MGMs brother  . Breast cancer Paternal Aunt   . Cancer Other        breast ca in [place]  . Cancer Other        g uncle colon or stomach ca    Social History Social History   Tobacco Use  . Smoking status: Former Smoker    Packs/day: 0.33    Years: 10.00    Pack years: 3.30    Types: Cigarettes    Quit date: 10/04/2011    Years since quitting: 7.0  . Smokeless tobacco: Never Used  Substance Use Topics  . Alcohol use: Yes    Alcohol/week: 0.0 standard drinks    Comment: social  . Drug use: No     Allergies   Ampicillin, Amoxicillin, Nsaids, and Sulfa antibiotics   Review of Systems Review of Systems  Constitutional: Negative for fatigue and fever.  HENT: Negative for ear pain, sinus pain, sore throat and voice change.   Eyes: Negative for pain, redness and visual disturbance.  Respiratory: Negative for cough and shortness of breath.   Cardiovascular: Negative for chest pain and palpitations.  Gastrointestinal: Negative for abdominal distention, abdominal pain, blood in stool, constipation, diarrhea, nausea, rectal pain and vomiting.  Genitourinary: Negative for difficulty urinating, frequency, pelvic pain, urgency and vaginal pain.  Musculoskeletal: Positive for arthralgias and gait problem. Negative for back pain,  myalgias and neck pain.  Skin: Negative for rash and wound.  Neurological: Negative for syncope and headaches.     Physical Exam Triage Vital Signs ED Triage Vitals [10/15/18 1603]  Enc Vitals Group     BP 111/77     Pulse Rate 82     Resp 18     Temp 98.3 F (36.8 C)     Temp Source Oral     SpO2 99 %     Weight      Height      Head Circumference      Peak Flow      Pain Score 10     Pain Loc      Pain Edu?      Excl. in [place]?    No data found.  Updated Vital Signs BP 111/77 (BP Location: Left Arm)   Pulse 82   Temp 98.3 F (36.8 C) (Oral)   Resp 18   LMP 05/11/2013   SpO2 99%    Physical Exam Constitutional:      General: She is not in acute distress. HENT:     Head: Normocephalic and atraumatic.  Eyes:     General: No scleral icterus.    Pupils: Pupils are equal, round, and reactive to light.  Cardiovascular:     Rate and Rhythm: Normal rate.  Pulmonary:     Effort: Pulmonary effort is normal.  Musculoskeletal:     Comments: Exam severely limited second to patient's cooperation due to pain.  No bony abnormality, swelling appreciated.  Patient is  diffusely tender to palpation along right hip.  Refuses to attempt standing second to pain.  NVI  Skin:    General: Skin is warm.     Coloration: Skin is not jaundiced or pale.     Findings: No bruising, erythema, lesion or rash.  Neurological:     General: No focal deficit present.     Mental Status: She is alert and oriented to person, place, and time.      UC Treatments / Results  Labs (all labs ordered are listed, but only abnormal results are displayed) Labs Reviewed  URIC ACID - Abnormal; Notable for the following components:      Result Value   Uric Acid, Serum 10.1 (*)    All other components within normal limits  SEDIMENTATION RATE - Abnormal; Notable for the following components:   Sed Rate 55 (*)    All other components within normal limits  C-REACTIVE PROTEIN - Abnormal; Notable for the  following components:   CRP 1.1 (*)    All other components within normal limits    EKG   Radiology Dg Hip Unilat With Pelvis 2-3 Views Left  Result Date: 10/15/2018 CLINICAL DATA:  Left hip pain beginning this morning.  No injury. EXAM: DG HIP (WITH OR WITHOUT PELVIS) 2-3V LEFT COMPARISON:  CT 03/14/2018 FINDINGS: There is no evidence of hip fracture or dislocation. There is no evidence of arthropathy or other focal bone abnormality. IMPRESSION: Negative. Electronically Signed   By: Marin Olp M.D.   On: 10/15/2018 17:37    Procedures Procedures (including critical care time)  Medications Ordered in UC Medications  ketorolac (TORADOL) injection 60 mg (60 mg Intramuscular Given 10/15/18 1743)  ketorolac (TORADOL) 60 MG/2ML injection (has no administration in time range)    Initial Impression / Assessment and Plan / UC Course  I have reviewed the triage vital signs and the nursing notes.  Pertinent labs & imaging results that were available during my care of the patient were reviewed by me and considered in my medical decision making (see chart for details).     1.  Acute pain of left hip Due to inability to bear weight right hip x-ray was obtained in office, reviewed by radiologist: Negative for fracture dislocation.  Given patient's history and current medication regimen this is likely gouty arthritis.  Patient already has colchicine at home, will take this as prescribed by her PCP for suspected gout flare.  CRP, sed rate, uric acid pending at time of discharge, patient contacted via phone with results: All elevated.  Patient instructed to take medications as discussed and follow-up with PCP regarding allopurinol dosing and possible adjustment of diuretics which are likely contributory.  Return precautions discussed, patient verbalized understanding and is agreeable to plan. Final Clinical Impressions(s) / UC Diagnoses   Final diagnoses:  Pain  Acute pain of left hip      Discharge Instructions     Take colchicine as prescribed. Continue allopurinol as prescribed. Follow up w/ PCP regarding Xray results, uric acid level (10.1), and allopurinol/diuretic use.    ED Prescriptions    None     Controlled Substance Prescriptions [place] Controlled Substance Registry consulted? Not Applicable   Quincy Sheehan, [place] 10/17/18 1402

## 2018-10-15 NOTE — Discharge Instructions (Signed)
Take colchicine as prescribed. Continue allopurinol as prescribed. Follow up w/ PCP regarding Xray results, uric acid level (10.1), and allopurinol/diuretic use.

## 2018-10-15 NOTE — ED Triage Notes (Signed)
Pt present left side hip pain. Pt states she cannot lift her left leg or apply pressure to her left side.

## 2018-10-17 ENCOUNTER — Encounter (HOSPITAL_COMMUNITY): Payer: Self-pay | Admitting: Emergency Medicine

## 2018-10-19 ENCOUNTER — Telehealth: Payer: Self-pay

## 2018-10-19 ENCOUNTER — Encounter: Payer: Self-pay | Admitting: Internal Medicine

## 2018-10-19 NOTE — Telephone Encounter (Signed)
Note in the chart  Dr. Quincy Simmonds referred her to rheumatology so she should hear from that office about rheumatology referral request for gout   Did she have bone scan? Is appt scheduled   I still want her to have that  Thanks [place]

## 2018-10-19 NOTE — Telephone Encounter (Signed)
Copied from [place] [phone]. Topic: General - Inquiry >> Oct 19, 2018 10:50 AM Virl Axe D wrote: Reason for CRM: Pt requesting callback from Dr. Claris Gladden assistant regarding her gout and referral to Rheumatologist followup. Please advise

## 2018-10-19 NOTE — Telephone Encounter (Signed)
Patient wanted to inform PCP that she has been out of work until Monday due to her gout she had a flare up. [facility]. Her breast biopsy was negative.  She wants to see rheumatologist at [facility] in [place].

## 2018-10-20 DIAGNOSIS — I890 Lymphedema, not elsewhere classified: Secondary | ICD-10-CM | POA: Diagnosis not present

## 2018-10-20 NOTE — Telephone Encounter (Signed)
Patient has been informed. Also she has appointment scheduled.

## 2018-10-27 DIAGNOSIS — R069 Unspecified abnormalities of breathing: Secondary | ICD-10-CM | POA: Diagnosis not present

## 2018-10-27 DIAGNOSIS — G4733 Obstructive sleep apnea (adult) (pediatric): Secondary | ICD-10-CM | POA: Diagnosis not present

## 2018-11-15 DIAGNOSIS — G4733 Obstructive sleep apnea (adult) (pediatric): Secondary | ICD-10-CM | POA: Diagnosis not present

## 2018-11-20 DIAGNOSIS — I890 Lymphedema, not elsewhere classified: Secondary | ICD-10-CM | POA: Diagnosis not present

## 2018-11-27 DIAGNOSIS — G4733 Obstructive sleep apnea (adult) (pediatric): Secondary | ICD-10-CM | POA: Diagnosis not present

## 2018-11-27 DIAGNOSIS — R069 Unspecified abnormalities of breathing: Secondary | ICD-10-CM | POA: Diagnosis not present

## 2018-12-22 ENCOUNTER — Ambulatory Visit (INDEPENDENT_AMBULATORY_CARE_PROVIDER_SITE_OTHER): Payer: BLUE CROSS/BLUE SHIELD | Admitting: Internal Medicine

## 2018-12-22 ENCOUNTER — Other Ambulatory Visit: Payer: Self-pay

## 2018-12-22 ENCOUNTER — Encounter: Payer: Self-pay | Admitting: Internal Medicine

## 2018-12-22 VITALS — BP 126/78 | HR 102 | Temp 97.6°F | Ht 65.0 in | Wt 255.0 lb

## 2018-12-22 DIAGNOSIS — D72819 Decreased white blood cell count, unspecified: Secondary | ICD-10-CM | POA: Diagnosis not present

## 2018-12-22 DIAGNOSIS — I10 Essential (primary) hypertension: Secondary | ICD-10-CM | POA: Diagnosis not present

## 2018-12-22 DIAGNOSIS — R0902 Hypoxemia: Secondary | ICD-10-CM

## 2018-12-22 DIAGNOSIS — I272 Pulmonary hypertension, unspecified: Secondary | ICD-10-CM

## 2018-12-22 DIAGNOSIS — R188 Other ascites: Secondary | ICD-10-CM

## 2018-12-22 DIAGNOSIS — K7460 Unspecified cirrhosis of liver: Secondary | ICD-10-CM

## 2018-12-22 DIAGNOSIS — J45909 Unspecified asthma, uncomplicated: Secondary | ICD-10-CM

## 2018-12-22 DIAGNOSIS — Z170 Estrogen receptor positive status [ER+]: Secondary | ICD-10-CM

## 2018-12-22 DIAGNOSIS — R600 Localized edema: Secondary | ICD-10-CM | POA: Diagnosis not present

## 2018-12-22 DIAGNOSIS — Z1329 Encounter for screening for other suspected endocrine disorder: Secondary | ICD-10-CM | POA: Diagnosis not present

## 2018-12-22 DIAGNOSIS — E119 Type 2 diabetes mellitus without complications: Secondary | ICD-10-CM | POA: Diagnosis not present

## 2018-12-22 DIAGNOSIS — R053 Chronic cough: Secondary | ICD-10-CM

## 2018-12-22 DIAGNOSIS — M109 Gout, unspecified: Secondary | ICD-10-CM

## 2018-12-22 DIAGNOSIS — R0609 Other forms of dyspnea: Secondary | ICD-10-CM

## 2018-12-22 DIAGNOSIS — C50212 Malignant neoplasm of upper-inner quadrant of left female breast: Secondary | ICD-10-CM

## 2018-12-22 DIAGNOSIS — R05 Cough: Secondary | ICD-10-CM

## 2018-12-22 DIAGNOSIS — R748 Abnormal levels of other serum enzymes: Secondary | ICD-10-CM

## 2018-12-22 DIAGNOSIS — J9611 Chronic respiratory failure with hypoxia: Secondary | ICD-10-CM

## 2018-12-22 DIAGNOSIS — R0600 Dyspnea, unspecified: Secondary | ICD-10-CM

## 2018-12-22 DIAGNOSIS — E559 Vitamin D deficiency, unspecified: Secondary | ICD-10-CM

## 2018-12-22 MED ORDER — IPRATROPIUM-ALBUTEROL 0.5-2.5 (3) MG/3ML IN SOLN: 3.0000 mL | RESPIRATORY_TRACT | 12 refills | Status: DC | PRN

## 2018-12-22 MED ORDER — SPIRONOLACTONE 50 MG PO TABS: 50.0000 mg | ORAL_TABLET | Freq: Every day | ORAL | 3 refills | Status: DC

## 2018-12-22 NOTE — Progress Notes (Addendum)
Chief Complaint  Patient presents with  . Follow-up   F/u 1. Recurrent gout with elevated uric acid she never heard from [facility] rheumatology referral  Uric acid remains elevated though pt on allopurinol 300 mg qd  08/30/2017 16:48 Uric Acid, Serum: 10.8 (H)  12/30/2017 16:38 Uric Acid, Serum: 10.1 (H)  03/21/2018 17:29 Uric Acid, Serum: 8.7 (H)  10/15/2018 16:48 Uric Acid, Serum: 10.1 (H)  12/22/2018 14:41 Uric Acid, Serum: 12.0 (H)  2. H/o breast cancer she had breast bx right breast 09/27/18 microcalcifications and stromal fibrosis negative malignancy she will have f/u 02/14/2019 [facility] H/o Dr. Merrie Roof and breast surgery f/u 04/14/19 -repeat mammogram due 07/05/2019    3. H/o pulmonary HTN (f/u [facility] Dr. Harrietta Guardian), OSA, cardiomegaly, chronic respiratory failure with hypoxia, chronic cough  -she saw pulm 04/2018 and has had multiple telemedicine visit since then last being in  09/2018 or 10/2018. They wanted to start her on Orenitram but it is unclear if she ever did this  -She is due for f/u  Pt has her O2 portable small tank (from [facility] now [facility]) with her today but is 85% on 2L with exertion on this small portable tank and we had to put her on our O2 tank 2L so I am not sure if the portable is working O2 after this was 91%. She reports she has to turn her portable tank up to 3-4 L  She reports with ambulation she gets sob and cough will notify Dr. Harrietta Guardian  4. Increased wt gain and leg edema. Wt up by ~30 lbs since 05/2018 and legs and abdomen tight per pt on spironlactone 25 mg qd, torsemide 20 mg qd but no relief -she recently missed her cardiology appt [facility] cardiology 11/17/18 but states she will reschedule  -will make urgent referral to them for fluid management with pulm HTN  [facility] 05/03/2018  Technically difficult study due to chest wall/lung interference  Normal left ventricular systolic function, ejection fraction 55%  Mitral annular calcification  Dilated right ventricle - moderate to  severe  Reduced right ventricular systolic function  Dilated right atrium - severe  Elevated right atrial pressure  5. Elevated alkaline phos advise pt with breast cancer history rec bone scan as Alk phos is continue to elevate      Review of Systems  Constitutional: Negative for weight loss.  HENT: Negative for hearing loss.   Eyes: Negative for blurred vision.  Respiratory: Positive for cough and shortness of breath.        Sob with exertion    Cardiovascular: Positive for leg swelling.  Gastrointestinal: Negative for abdominal pain.  Musculoskeletal: Negative for falls.  Skin: Negative for rash.  Neurological: Negative for headaches.  Psychiatric/Behavioral: Negative for depression.   Past Medical History:  Diagnosis Date  . Anemia    iron deficiency  . Arthritis   . Borderline diabetes   . Complication of anesthesia    woke up slowly- after hysterectomy- 2015  . Diabetes mellitus, type II ([place])   . DVT (deep venous thrombosis) ([place]) 2014   left leg  . Family history of breast cancer   . Gout   . HTN (hypertension)   . Malignant neoplasm of upper-inner quadrant of left female breast ([place]) 03/06/2016  . Menorrhagia    secondary to uterine fibroids  . OSA (obstructive sleep apnea)    07/25/13 HST AHI 33/hr, severe hypoxemia O2 min 42% and 95% of the time <89%  . PE (pulmonary embolism) 2014  bilateral  . Pulmonary artery hypertension ([place])   . Pulmonary nodule    (35m on loeft lower lobe) found on CT scan July 2014, repeat scan Jan 2015 showed less than 479m . Right ovarian cyst    noted 09/2012   . S/P TAH (total abdominal hysterectomy) 06/07/2013  . Trichomoniasis    05/2011    Past Surgical History:  Procedure Laterality Date  . ABDOMINAL HYSTERECTOMY N/A 06/07/2013   Procedure: HYSTERECTOMY ABDOMINAL WITH BIALTERAL SALPINGECTOMY;  Surgeon: VaElveria RoyalsMD;  [place] [facility];  Service: Gynecology;  Laterality: N/A;  . BIOPSY  09/02/2017   Procedure:  BIOPSY;  Surgeon: ScWilford CornerMD;  [facility];  Service: Endoscopy;;  . BREAST LUMPECTOMY Left 07/27/2016   BREAST LUMPECTOMY WITH RADIOACTIVE SEED AND SENTINEL LYMPH NODE BIOPSY (Left)  . BREAST LUMPECTOMY WITH RADIOACTIVE SEED AND SENTINEL LYMPH NODE BIOPSY Left 07/27/2016   Procedure: BREAST LUMPECTOMY WITH RADIOACTIVE SEED AND SENTINEL LYMPH NODE BIOPSY;  Surgeon: FaStark KleinMD;  [place] [facility] Service: General;  Laterality: Left;  . CARDIAC CATHETERIZATION N/A 12/24/2015   Procedure: Right Heart Cath;  Surgeon: JaAdrian ProwsMD;  [place];  Service: Cardiovascular;  Laterality: N/A;  . CESAREAN SECTION    . COLONOSCOPY    . COLONOSCOPY WITH PROPOFOL N/A 09/02/2017   Procedure: COLONOSCOPY WITH PROPOFOL;  Surgeon: ScWilford CornerMD;  [facility];  Service: Endoscopy;  Laterality: N/A;  . ESOPHAGOGASTRODUODENOSCOPY (EGD) WITH PROPOFOL N/A 09/02/2017   Procedure: ESOPHAGOGASTRODUODENOSCOPY (EGD) WITH PROPOFOL;  Surgeon: ScWilford CornerMD;  [facility];  Service: Endoscopy;  Laterality: N/A;  . IR CV LINE INJECTION  07/27/2017  . POLYPECTOMY  2008   Removal of uterine polyp  . PORT-A-CATH REMOVAL Right 09/09/2017   Procedure: REMOVAL PORT-A-CATH;  Surgeon: ByStark KleinMD;  [place] [facility] Service: General;  Laterality: Right;  . PORTA CATH INSERTION  07/27/2016  . PORTACATH PLACEMENT Right 07/27/2016   Procedure: INSERTION PORT-A-CATH;  Surgeon: FaStark KleinMD;  [facility] Service: General;  Laterality: Right;  . SUBMUCOSAL INJECTION  09/02/2017   Procedure: SUBMUCOSAL INJECTION;  Surgeon: ScWilford CornerMD;  [facility];  Service: Endoscopy;;  epi injection   Family History  Problem Relation Age of Onset  . Hypertension Mother   . Kidney disease Mother   . Hypertension Father   . Stroke Sister   . Breast cancer Maternal Grandmother        died at 5731. Breast cancer Paternal  Grandmother   . Breast cancer Cousin        pat first cousin dx in her 3044s. Hypertension Brother   . Cancer Maternal Aunt        unknown form  . Breast cancer Paternal Aunt   . Colon cancer Other [id number]     MGMs brother  . Breast cancer Paternal Aunt   . Cancer Other        breast ca in [place]  . Cancer Other        g uncle colon or stomach ca   Social History   Socioeconomic History  . Marital status: Single    Spouse name: Not on file  . Number of children: 1  . Years of education: Not on file  . Highest education level: Not on file  Occupational History  . Not on file  Social Needs  . Financial resource strain: Not  on file  . Food insecurity    Worry: Not on file    Inability: Not on file  . Transportation needs    Medical: Not on file    Non-medical: Not on file  Tobacco Use  . Smoking status: Former Smoker    Packs/day: 0.33    Years: 10.00    Pack years: 3.30    Types: Cigarettes    Quit date: 10/04/2011    Years since quitting: 7.2  . Smokeless tobacco: Never Used  Substance and Sexual Activity  . Alcohol use: Yes    Alcohol/week: 0.0 standard drinks    Comment: social  . Drug use: No  . Sexual activity: Yes  Lifestyle  . Physical activity    Days per week: Not on file    Minutes per session: Not on file  . Stress: Not on file  Relationships  . Social Herbalist on phone: Not on file    Gets together: Not on file    Attends religious service: Not on file    Active member of club or organization: Not on file    Attends meetings of clubs or organizations: Not on file    Relationship status: Not on file  . Intimate partner violence    Fear of current or ex partner: Not on file    Emotionally abused: Not on file    Physically abused: Not on file    Forced sexual activity: Not on file  Other Topics Concern  . 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 acetaminophen (TYLENOL) 500 MG tablet Take 1,000 mg by mouth every 6 (six) hours as needed for moderate pain or headache.   . albuterol (PROVENTIL HFA;VENTOLIN HFA) 108 (90 Base) MCG/ACT inhaler Inhale 2 puffs into the lungs every 6 (six) hours as needed for wheezing or shortness of breath.  . allopurinol (ZYLOPRIM) 300 MG tablet Take 1 tablet (300 mg total) by mouth daily.  Marland Kitchen anastrozole (ARIMIDEX) 1 MG tablet Take 1 mg by mouth daily.  Marland Kitchen azelastine (ASTELIN) 0.1 % nasal spray Place 1 spray into both nostrils 2 (two) times daily. Use in each nostril as directed  . bismuth subsalicylate (PEPTO-BISMOL) 262 MG/15ML suspension Take 30 mLs by mouth 4 (four) times daily -  before meals and at bedtime. X 2 weeks  . chlorpheniramine-HYDROcodone (TUSSIONEX PENNKINETIC ER) 10-8 MG/5ML SUER Take 5 mLs by mouth at bedtime as needed for cough.  . Cholecalciferol 50000 units capsule Take 1 capsule (50,000 Units total) by mouth once a week.  . Colchicine (MITIGARE) 0.6 MG CAPS Take 1 capsule by mouth as needed. As needed gout flare max # of pills 2 in 24 hours  . doxycycline (VIBRA-TABS) 100 MG tablet Take 1 tablet (100 mg total) by mouth 2 (two) times daily.  . fluticasone (FLONASE) 50 MCG/ACT nasal spray Place 2 sprays into both nostrils daily.   . Fluticasone-Salmeterol (ADVAIR DISKUS) 250-50 MCG/DOSE AEPB Inhale 1 puff into the lungs 2 (two) times daily. Rinse mouth  . ibuprofen (ADVIL,MOTRIN) 800 MG tablet Take 1 tablet (800 mg total) by mouth every 8 (eight) hours as needed.  . loratadine (CLARITIN) 10 MG tablet Take 10 mg by mouth  daily.  . metFORMIN (GLUCOPHAGE) 500 MG tablet Take 1 tablet (500 mg total) by mouth 2 (two) times daily with a meal.  . metoprolol tartrate (LOPRESSOR) 25 MG tablet Take 25 mg by mouth daily.  . Multiple Vitamin (MULTIVITAMIN) tablet Take 1 tablet by mouth  daily.  Marland Kitchen nystatin (MYCOSTATIN) 100000 UNIT/ML suspension Take 5 mLs (500,000 Units total) by mouth 4 (four) times daily. X 7-10 days  . omeprazole (PRILOSEC) 40 MG capsule Take 1 capsule (40 mg total) by mouth daily. 30 minutes before dinner  . OPSUMIT 10 MG TABS Take 10 mg by mouth daily.  Vladimir Faster Glycol-Propyl Glycol (SYSTANE OP) Place 1 drop into both eyes daily as needed (dry eyes).   . sildenafil (REVATIO) 20 MG tablet Take 40 mg by mouth 3 (three) times daily.   Marland Kitchen spironolactone (ALDACTONE) 50 MG tablet Take 1 tablet (50 mg total) by mouth daily.  Marland Kitchen telmisartan (MICARDIS) 40 MG tablet Take 1 tablet (40 mg total) by mouth daily.  Marland Kitchen tinidazole (TINDAMAX) 500 MG tablet Take 1 tablet (500 mg total) by mouth 2 (two) times daily with a meal.  . torsemide (DEMADEX) 20 MG tablet Take 20 mg by mouth daily as needed.   . torsemide (DEMADEX) 20 MG tablet Take 20 mg by mouth daily.  Marland Kitchen triamcinolone cream (KENALOG) 0.1 % Apply 1 application topically daily as needed (leg discoloration).  Marland Kitchen UPTRAVI 400 MCG TABS Take 400 mcg by mouth 2 (two) times daily.   . [DISCONTINUED] spironolactone (ALDACTONE) 25 MG tablet Take 1 tablet (25 mg total) by mouth daily.   Allergies  Allergen Reactions  . Ampicillin Other (See Comments)    Severe abdominal pain, dizziness (penicillin is okay)  . Amoxicillin Other (See Comments)    LIGHT-HEADED and CLOSE TO "PASSING OUT"  AND ABDOMINAL PAIN Has patient had a PCN reaction causing immediate rash, facial/tongue/throat swelling, SOB or lightheadedness with hypotension: No Has patient had a PCN reaction causing severe rash involving mucus membranes or skin necrosis: No Has patient had a PCN reaction that required hospitalization No Has patient had a PCN reaction occurring within the last 10 years: No If all of the above answers are "NO", then may proceed with Cephalosporin use.  . Indomethacin Other (See Comments)    Gastro irritation  . Nsaids     GI ulcers    . Metronidazole Nausea And Vomiting  . Sulfa Antibiotics Rash and Other (See Comments)    Very bad yeast infection   Recent Results (from the past 2160 hour(s))  Uric acid     Status: Abnormal   Collection Time: 10/15/18  4:48 PM  Result Value Ref Range   Uric Acid, Serum 10.1 (H) 2.5 - 7.1 mg/dL    Comment: Performed at [facility] [address], [place], [place] [address]  Sedimentation rate     Status: Abnormal   Collection Time: 10/15/18  4:48 PM  Result Value Ref Range   Sed Rate 55 (H) 0 - 22 mm/hr    Comment: Performed at [facility] [address], [place], [place] [address]  C-reactive protein     Status: Abnormal   Collection Time: 10/15/18  4:48 PM  Result Value Ref Range   CRP 1.1 (H) <1.0 mg/dL    Comment: Performed at [facility], [place] [address] [place], [place] [address]  Lipid panel     Status: Abnormal   Collection Time: 12/22/18  2:41 PM  Result  Value Ref Range   Cholesterol 119 0 - 200 mg/dL    Comment: ATP III Classification       Desirable:  < 200 mg/dL               Borderline High:  200 - 239 mg/dL          High:  > = 240 mg/dL   Triglycerides 99.0 0.0 - 149.0 mg/dL    Comment: Normal:  <150 mg/dLBorderline High:  150 - 199 mg/dL   HDL 30.00 (L) >39.00 mg/dL   VLDL 19.8 0.0 - 40.0 mg/dL   LDL Cholesterol 69 0 - 99 mg/dL   Total CHOL/HDL Ratio 4     Comment:                Men          Women1/2 Average Risk     3.4          3.3Average Risk          5.0          4.42X Average Risk          9.6          7.13X Average Risk          15.0          11.0                       NonHDL 88.64     Comment: NOTE:  Non-HDL goal should be 30 mg/dL higher than patient's LDL goal (i.e. LDL goal of < 70 mg/dL, would have non-HDL goal of < 100 mg/dL)  Hemoglobin A1c     Status: None   Collection Time: 12/22/18  2:41 PM  Result Value Ref Range   Hgb A1c MFr Bld 6.4 4.6 - 6.5 %    Comment: Glycemic Control Guidelines for People with Diabetes:Non  Diabetic:  <6%Goal of Therapy: <7%Additional Action Suggested:  >8%   TSH     Status: None   Collection Time: 12/22/18  2:41 PM  Result Value Ref Range   TSH 2.87 0.35 - 4.50 uIU/mL  Vitamin D (25 hydroxy)     Status: Abnormal   Collection Time: 12/22/18  2:41 PM  Result Value Ref Range   VITD 22.34 (L) 30.00 - 100.00 ng/mL  Comprehensive metabolic panel     Status: Abnormal   Collection Time: 12/22/18  2:41 PM  Result Value Ref Range   Sodium 141 135 - 145 mEq/L   Potassium 4.1 3.5 - 5.1 mEq/L   Chloride 97 96 - 112 mEq/L   CO2 33 (H) 19 - 32 mEq/L   Glucose, Bld 91 70 - 99 mg/dL   BUN 23 6 - 23 mg/dL   Creatinine, Ser 1.15 0.40 - 1.20 mg/dL   Total Bilirubin 2.9 (H) 0.2 - 1.2 mg/dL   Alkaline Phosphatase 212 (H) 39 - 117 U/L   AST 25 0 - 37 U/L   ALT 15 0 - 35 U/L   Total Protein 6.8 6.0 - 8.3 g/dL   Albumin 3.6 3.5 - 5.2 g/dL   Calcium 9.4 8.4 - 10.5 mg/dL   GFR 59.76 (L) >60.00 mL/min  Urinalysis, Routine w reflex microscopic     Status: Abnormal   Collection Time: 12/22/18  2:41 PM  Result Value Ref Range   Color, Urine DARK YELLOW YELLOW   APPearance CLEAR CLEAR   Specific Gravity, Urine 1.013 1.001 -  1.03   pH 5.5 5.0 - 8.0   Glucose, UA NEGATIVE NEGATIVE   Bilirubin Urine 1+ (A) NEGATIVE    Comment: Verified by repeat analysis. Marland Kitchen . A false positive bilirubin may be caused by the drug lodine. Marland Kitchen    Ketones, ur NEGATIVE NEGATIVE   Hgb urine dipstick NEGATIVE NEGATIVE   Protein, ur 2+ (A) NEGATIVE   Nitrite NEGATIVE NEGATIVE   Leukocytes,Ua NEGATIVE NEGATIVE   WBC, UA NONE SEEN 0 - 5 /HPF   RBC / HPF 0-2 0 - 2 /HPF   Squamous Epithelial / LPF 6-10 (A) < OR = 5 /HPF   Bacteria, UA NONE SEEN NONE SEEN /HPF   Hyaline Cast 0-5 (A) NONE SEEN /LPF  Microalbumin / creatinine urine ratio     Status: Abnormal   Collection Time: 12/22/18  2:41 PM  Result Value Ref Range   Creatinine, Urine 122 20 - 275 mg/dL   Microalb, Ur 48.6 mg/dL    Comment: Verified by repeat  analysis. Marland Kitchen Reference Range Not established    Microalb Creat Ratio 398 (H) <30 mcg/mg creat    Comment: . The ADA defines abnormalities in albumin excretion as follows: Marland Kitchen Category         Result (mcg/mg creatinine) . Normal                    <30 Microalbuminuria         30-299  Clinical albuminuria   > OR = 300 . The ADA recommends that at least two of three specimens collected within a 3-6 month period be abnormal before considering a patient to be within a diagnostic category.   CBC with Differential/Platelet     Status: Abnormal   Collection Time: 12/22/18  2:41 PM  Result Value Ref Range   WBC 4.0 4.0 - 10.5 K/uL   RBC 3.80 (L) 3.87 - 5.11 Mil/uL   Hemoglobin 13.4 12.0 - 15.0 g/dL   HCT 40.5 36.0 - 46.0 %   MCV 106.5 (H) 78.0 - 100.0 fl   MCHC 33.0 30.0 - 36.0 g/dL   RDW 15.3 11.5 - 15.5 %   Platelets 147.0 (L) 150.0 - 400.0 K/uL   Neutrophils Relative % 70.4 43.0 - 77.0 %   Lymphocytes Relative 14.1 12.0 - 46.0 %   Monocytes Relative 12.9 (H) 3.0 - 12.0 %   Eosinophils Relative 1.6 0.0 - 5.0 %   Basophils Relative 1.0 0.0 - 3.0 %   Neutro Abs 2.8 1.4 - 7.7 K/uL   Lymphs Abs 0.6 (L) 0.7 - 4.0 K/uL   Monocytes Absolute 0.5 0.1 - 1.0 K/uL   Eosinophils Absolute 0.1 0.0 - 0.7 K/uL   Basophils Absolute 0.0 0.0 - 0.1 K/uL  Uric acid     Status: Abnormal   Collection Time: 12/22/18  2:41 PM  Result Value Ref Range   Uric Acid, Serum 12.0 (H) 2.4 - 7.0 mg/dL  Magnesium     Status: Abnormal   Collection Time: 12/22/18  2:41 PM  Result Value Ref Range   Magnesium 1.3 (L) 1.5 - 2.5 mg/dL   Objective  Body mass index is 42.43 kg/m. Wt Readings from Last 3 Encounters:  12/22/18 255 lb (115.7 kg)  06/21/18 226 lb 9.6 oz (102.8 kg)  05/24/18 226 lb (102.5 kg)   Temp Readings from Last 3 Encounters:  12/22/18 97.6 F (36.4 C) (Oral)  10/15/18 98.3 F (36.8 C) (Oral)  06/21/18 98.4 F (36.9 C) (Oral)  BP Readings from Last 3 Encounters:  12/22/18 126/78   10/15/18 111/77  06/21/18 112/84   Pulse Readings from Last 3 Encounters:  12/22/18 (!) 102  10/15/18 82  06/21/18 (!) 102    Physical Exam Vitals signs and nursing note reviewed.  Constitutional:      Appearance: Normal appearance. She is well-developed and well-groomed. She is obese. She is ill-appearing.  HENT:     Head: Normocephalic and atraumatic.     Comments: +mask on   Cardiovascular:     Rate and Rhythm: Regular rhythm. Tachycardia present.     Heart sounds: Normal heart sounds. No murmur.  Pulmonary:     Effort: Pulmonary effort is normal.     Breath sounds: Normal breath sounds. No rales.     Comments: On O2 2L O2 91%  With exertion and her defective portable O2 O2 sat 85% Abdominal:     General: Bowel sounds are normal. There is distension.     Palpations: There is no fluid wave.     Tenderness: There is no abdominal tenderness.     Comments: Neg fluid wave   Musculoskeletal:     Right lower leg: 2+ Pitting Edema present.     Left lower leg: 2+ Pitting Edema present.  Skin:    General: Skin is warm and dry.       Neurological:     General: No focal deficit present.     Mental Status: She is alert and oriented to person, place, and time. Mental status is at baseline.     Gait: Gait normal.  Psychiatric:        Attention and Perception: Attention and perception normal.        Mood and Affect: Mood and affect normal.        Speech: Speech normal.        Behavior: Behavior normal. Behavior is cooperative.        Thought Content: Thought content normal.        Cognition and Memory: Cognition and memory normal.        Judgment: Judgment normal.     Assessment  Plan  Gout, unspecified cause, unspecified chronicity, unspecified site - Plan: Uric acid, Ambulatory referral to Rheumatology [facility] on allopurinol 300 mg qd and uric acid still elevating   Essential hypertension - Plan: spironolactone (ALDACTONE) 50 MG tablet increased from 25 mg qam, Lipid panel,  Comprehensive metabolic panel, Ambulatory referral to Cardiology [facility]  Leg edema - Plan: spironolactone (ALDACTONE) 50 MG tablet increased from 25 also on Torsemide 20 mg qd, Ambulatory referral to Cardiology [place], Ambulatory referral to Pulmonology Dr. Lacinda Axon  Type 2 diabetes mellitus without complication, without long-term current use of insulin ([place]) - Plan: Hemoglobin A1c, Urinalysis, Routine w reflex microscopic, Microalbumin / creatinine urine ratio  -cont metformin controlled   Leukopenia, unspecified type - Plan: CBC with Differential/Platelet -f/u H/o 01/2019 [facility]   Hypomagnesemia - Plan: Magnesium rec take Mag ox 500 mg qd if she can tolerate   Reactive airway disease without complication, unspecified asthma severity, unspecified whether persistent - Plan: ipratropium-albuterol (DUONEB) 0.5-2.5 (3) MG/3ML SOLN, Ambulatory referral to Pulmonology  Chronic cough likely related to pulm HTN/multifactorial - Plan: ipratropium-albuterol (DUONEB) 0.5-2.5 (3) MG/3ML SOLN, Ambulatory referral to Pulmonology  Chronic respiratory failure with hypoxia ([place]) - Plan: Ambulatory referral to Pulmonology  [facility] to coordinate working portable O2 tank  Malignant neoplasm of upper-inner quadrant of left breast in female, estrogen receptor positive ([place]) in remission  on Arimidex 1 mg qd - Plan: NM Bone Scan Whole Body -with elevating alk phos bone scan rec r/o bone mets  Pulmonary HTN ([place]) - Plan: Ambulatory referral to Cardiology, Ambulatory referral to Pulmonology to help med management and coordinate care  -of note Dr. Harrietta Guardian I am not sue if pt is taking Orenitram as you last wanted her to take and she needs new small portable O2 tank not working 85% with exertion on 2L switched to our tanke in office and 91% on 2L -she c/o sob with exertion, worsening leg edema, ab distension, wt gain, and cough with exertion.   Hypoxia improved on 2L [place] see physical exam/HPI- Plan: Ambulatory referral to  Cardiology, Ambulatory referral to Pulmonology  Cirrhosis of liver with ascites, unspecified hepatic cirrhosis type ([place]) -if ab dist. Does not improve consider US abdomen prior imaging noted cirrhosis  -on toresemide 20 mg qd and increased spironolactone to 50 mg qd   Dyspnea on exertion - Plan: Ambulatory referral to Cardiology, Ambulatory referral to Pulmonology    HM Fluutdhad work 12/19/2018  MMR immune Hep A vx given needs repeat 03/22/19 Hep B new vaccine1/2 will need repeat  In future missed 1 month repeat  -with insurance change not sure what her insurance will cover getting new insurance in 2021 vs she can get at her pharmacy   Tdap utd Consider shingrix in future and pna 23  Hep C negative11/29/17  Pap smear  -s/p TAH(w/o cervix only ovaries intact)DUB 2/2 fibroids, adenomyosis  -follows with Dr. M[facility] pap neg 6/2010last saw 01/2017 Dr. Benjie Karvonen  -obtained records 04/14/17 visit  DEXA [facility] 06/28/17 normal  [facility] 07/05/2018 abnormal with new right breast cal. H/o left breast cancer -breast bx 09/27/18 right breast negative  -f/u [facility] h/o due in 01/2019 and [facility] surgery 03/2019 due to insurance reasons initial care was with h/o (Dr. Renold Genta surgery (Dr. Barry Dienes) in [place] of note   Colonoscopy/EGD had 6/6/19[facility] Dr.  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 Lacinda Axon cath lab 06/02/2018 [place], worsening HD status on max  oral therapy selexipag, sildenafil, macitentan will rec iv remodulin  Of note selexipa 400 mcg bid 05/2016 to 50/2020 stopped due to change to add oral or iv treprostinil  2L with exertion prn and 6 L qhs on cpap Provider: Dr. Olivia Mackie McLean-Scocuzza-Internal Medicine

## 2018-12-22 NOTE — Patient Instructions (Addendum)
Utter cream  Ambi fading cream   Magnesium low rec increase to 500 daily   Bone scan and echo asap    No more than 50 ounces of fluid  Increase spironolactone to 50 mg daily    Edema  Edema is an abnormal buildup of fluids in the body tissues and under the skin. Swelling of the legs, feet, and ankles is a common symptom that becomes more likely as you get older. Swelling is also common in looser tissues, like around the eyes. When the affected area is squeezed, the fluid may move out of that spot and leave a dent for a few moments. This dent is called pitting edema. There are many possible causes of edema. Eating too much salt (sodium) and being on your feet or sitting for a long time can cause edema in your legs, feet, and ankles. Hot weather may make edema worse. Common causes of edema include:  Heart failure.  Liver or kidney disease.  Weak leg blood vessels.  Cancer.  An injury.  Pregnancy.  Medicines.  Being obese.  Low protein levels in the blood. Edema is usually painless. Your skin may look swollen or shiny. Follow these instructions at home:  Keep the affected body part raised (elevated) above the level of your heart when you are sitting or lying down.  Do not sit still or stand for long periods of time.  Do not wear tight clothing. Do not wear garters on your upper legs.  Exercise your legs to get your circulation going. This helps to move the fluid back into your blood vessels, and it may help the swelling go down.  Wear elastic bandages or support stockings to reduce swelling as told by your health care provider.  Eat a low-salt (low-sodium) diet to reduce fluid as told by your health care provider.  Depending on the cause of your swelling, you may need to limit how much fluid you drink (fluid restriction).  Take over-the-counter and prescription medicines only as told by your health care provider. Contact a health care provider if:  Your edema does  not get better with treatment.  You have heart, liver, or kidney disease and have symptoms of edema.  You have sudden and unexplained weight gain. Get help right away if:  You develop shortness of breath or chest pain.  You cannot breathe when you lie down.  You develop pain, redness, or warmth in the swollen areas.  You have heart, liver, or kidney disease and suddenly get edema.  You have a fever and your symptoms suddenly get worse. Summary  Edema is an abnormal buildup of fluids in the body tissues and under the skin.  Eating too much salt (sodium) and being on your feet or sitting for a long time can cause edema in your legs, feet, and ankles.  Keep the affected body part raised (elevated) above the level of your heart when you are sitting or lying down. This information is not intended to replace advice given to you by your health care provider. Make sure you discuss any questions you have with your health care provider. Document Released: 03/16/2005 Document Revised: 03/19/2017 Document Reviewed: 04/18/2016 Elsevier Patient Education  2020 [employer].

## 2018-12-22 NOTE — Progress Notes (Signed)
Pre visit review using our clinic review tool, if applicable. No additional management support is needed unless otherwise documented below in the visit note. 

## 2018-12-23 LAB — LIPID PANEL
Cholesterol: 119 mg/dL (ref 0–200)
HDL: 30 mg/dL — ABNORMAL LOW (ref >39.00)
LDL Cholesterol: 69 mg/dL (ref 0–99)
NonHDL: 88.64
Total CHOL/HDL Ratio: 4
Triglycerides: 99 mg/dL (ref 0.0–149.0)
VLDL: 19.8 mg/dL (ref 0.0–40.0)

## 2018-12-23 LAB — CBC WITH DIFFERENTIAL/PLATELET
Basophils Absolute: 0 10*3/uL (ref 0.0–0.1)
Basophils Relative: 1 % (ref 0.0–3.0)
Eosinophils Absolute: 0.1 10*3/uL (ref 0.0–0.7)
Eosinophils Relative: 1.6 % (ref 0.0–5.0)
HCT: 40.5 % (ref 36.0–46.0)
Hemoglobin: 13.4 g/dL (ref 12.0–15.0)
Lymphocytes Relative: 14.1 % (ref 12.0–46.0)
Lymphs Abs: 0.6 10*3/uL — ABNORMAL LOW (ref 0.7–4.0)
MCHC: 33 g/dL (ref 30.0–36.0)
MCV: 106.5 fl — ABNORMAL HIGH (ref 78.0–100.0)
Monocytes Absolute: 0.5 10*3/uL (ref 0.1–1.0)
Monocytes Relative: 12.9 % — ABNORMAL HIGH (ref 3.0–12.0)
Neutro Abs: 2.8 10*3/uL (ref 1.4–7.7)
Neutrophils Relative %: 70.4 % (ref 43.0–77.0)
Platelets: 147 10*3/uL — ABNORMAL LOW (ref 150.0–400.0)
RBC: 3.8 Mil/uL — ABNORMAL LOW (ref 3.87–5.11)
RDW: 15.3 % (ref 11.5–15.5)
WBC: 4 10*3/uL (ref 4.0–10.5)

## 2018-12-23 LAB — COMPREHENSIVE METABOLIC PANEL
ALT: 15 U/L (ref 0–35)
AST: 25 U/L (ref 0–37)
Albumin: 3.6 g/dL (ref 3.5–5.2)
Alkaline Phosphatase: 212 U/L — ABNORMAL HIGH (ref 39–117)
BUN: 23 mg/dL (ref 6–23)
CO2: 33 mEq/L — ABNORMAL HIGH (ref 19–32)
Calcium: 9.4 mg/dL (ref 8.4–10.5)
Chloride: 97 mEq/L (ref 96–112)
Creatinine, Ser: 1.15 mg/dL (ref 0.40–1.20)
GFR: 59.76 mL/min — ABNORMAL LOW (ref >60.00)
Glucose, Bld: 91 mg/dL (ref 70–99)
Potassium: 4.1 mEq/L (ref 3.5–5.1)
Sodium: 141 mEq/L (ref 135–145)
Total Bilirubin: 2.9 mg/dL — ABNORMAL HIGH (ref 0.2–1.2)
Total Protein: 6.8 g/dL (ref 6.0–8.3)

## 2018-12-23 LAB — URINALYSIS, ROUTINE W REFLEX MICROSCOPIC
Bacteria, UA: NONE SEEN /HPF
Glucose, UA: NEGATIVE
Hgb urine dipstick: NEGATIVE
Ketones, ur: NEGATIVE
Leukocytes,Ua: NEGATIVE
Nitrite: NEGATIVE
Specific Gravity, Urine: 1.013 (ref 1.001–1.03)
WBC, UA: NONE SEEN /HPF (ref 0–5)
pH: 5.5 (ref 5.0–8.0)

## 2018-12-23 LAB — VITAMIN D 25 HYDROXY (VIT D DEFICIENCY, FRACTURES): VITD: 22.34 ng/mL — ABNORMAL LOW (ref 30.00–100.00)

## 2018-12-23 LAB — MICROALBUMIN / CREATININE URINE RATIO
Creatinine, Urine: 122 mg/dL (ref 20–275)
Microalb Creat Ratio: 398 mcg/mg creat — ABNORMAL HIGH (ref <30)
Microalb, Ur: 48.6 mg/dL

## 2018-12-23 LAB — MAGNESIUM: Magnesium: 1.3 mg/dL — ABNORMAL LOW (ref 1.5–2.5)

## 2018-12-23 LAB — TSH: TSH: 2.87 u[IU]/mL (ref 0.35–4.50)

## 2018-12-23 LAB — HEMOGLOBIN A1C: Hgb A1c MFr Bld: 6.4 % (ref 4.6–6.5)

## 2018-12-23 LAB — URIC ACID: Uric Acid, Serum: 12 mg/dL — ABNORMAL HIGH (ref 2.4–7.0)

## 2018-12-27 NOTE — Progress Notes (Signed)
Note has been faxed electronically to Dr Suzi Roots

## 2018-12-28 DIAGNOSIS — R069 Unspecified abnormalities of breathing: Secondary | ICD-10-CM | POA: Diagnosis not present

## 2018-12-28 DIAGNOSIS — G4733 Obstructive sleep apnea (adult) (pediatric): Secondary | ICD-10-CM | POA: Diagnosis not present

## 2018-12-30 ENCOUNTER — Telehealth: Payer: Self-pay

## 2018-12-30 ENCOUNTER — Other Ambulatory Visit: Payer: Self-pay | Admitting: Internal Medicine

## 2018-12-30 ENCOUNTER — Telehealth: Payer: Self-pay | Admitting: Internal Medicine

## 2018-12-30 DIAGNOSIS — C50912 Malignant neoplasm of unspecified site of left female breast: Secondary | ICD-10-CM | POA: Diagnosis not present

## 2018-12-30 DIAGNOSIS — Z7984 Long term (current) use of oral hypoglycemic drugs: Secondary | ICD-10-CM | POA: Diagnosis not present

## 2018-12-30 DIAGNOSIS — E119 Type 2 diabetes mellitus without complications: Secondary | ICD-10-CM | POA: Diagnosis not present

## 2018-12-30 DIAGNOSIS — I10 Essential (primary) hypertension: Secondary | ICD-10-CM | POA: Diagnosis not present

## 2018-12-30 DIAGNOSIS — J9621 Acute and chronic respiratory failure with hypoxia: Secondary | ICD-10-CM | POA: Diagnosis not present

## 2018-12-30 DIAGNOSIS — D751 Secondary polycythemia: Secondary | ICD-10-CM | POA: Diagnosis not present

## 2018-12-30 DIAGNOSIS — Z7951 Long term (current) use of inhaled steroids: Secondary | ICD-10-CM | POA: Diagnosis not present

## 2018-12-30 DIAGNOSIS — G4730 Sleep apnea, unspecified: Secondary | ICD-10-CM | POA: Diagnosis not present

## 2018-12-30 DIAGNOSIS — I2721 Secondary pulmonary arterial hypertension: Secondary | ICD-10-CM

## 2018-12-30 DIAGNOSIS — Z6841 Body Mass Index (BMI) 40.0 and over, adult: Secondary | ICD-10-CM | POA: Diagnosis not present

## 2018-12-30 DIAGNOSIS — G4733 Obstructive sleep apnea (adult) (pediatric): Secondary | ICD-10-CM | POA: Diagnosis not present

## 2018-12-30 DIAGNOSIS — Z9981 Dependence on supplemental oxygen: Secondary | ICD-10-CM | POA: Diagnosis not present

## 2018-12-30 DIAGNOSIS — I272 Pulmonary hypertension, unspecified: Secondary | ICD-10-CM | POA: Diagnosis not present

## 2018-12-30 DIAGNOSIS — I50813 Acute on chronic right heart failure: Secondary | ICD-10-CM | POA: Diagnosis not present

## 2018-12-30 DIAGNOSIS — Z9989 Dependence on other enabling machines and devices: Secondary | ICD-10-CM | POA: Diagnosis not present

## 2018-12-30 DIAGNOSIS — Z79899 Other long term (current) drug therapy: Secondary | ICD-10-CM | POA: Diagnosis not present

## 2018-12-30 DIAGNOSIS — R911 Solitary pulmonary nodule: Secondary | ICD-10-CM | POA: Diagnosis not present

## 2018-12-30 DIAGNOSIS — I749 Embolism and thrombosis of unspecified artery: Secondary | ICD-10-CM | POA: Diagnosis not present

## 2018-12-30 DIAGNOSIS — Z87891 Personal history of nicotine dependence: Secondary | ICD-10-CM | POA: Diagnosis not present

## 2018-12-30 NOTE — Telephone Encounter (Signed)
Please bring labs here or fax and I will order  sch lab appt   [place]

## 2018-12-30 NOTE — Telephone Encounter (Signed)
Copied from [place] [id number]. Topic: General - Inquiry >> Dec 30, 2018  2:15 PM Mathis Bud wrote: Reason for CRM: Clarise Cruz from [facility] pulmonary called stating patient saw a doctor at their office today and doctor wanted her to get labs done.  Patient wants to do labs at [facility] office.  Clarise Cruz will fax over the lab orders. Clarise Cruz call back [phone]

## 2018-12-30 NOTE — Telephone Encounter (Signed)
Call pt  Pt needs non fasting lab visit 10/6 or 10/7 for 2 labs ordered  Schedule   Once labs resulted fax to Dr. Harrietta Guardian lung MD [phone]

## 2018-12-30 NOTE — Telephone Encounter (Signed)
Patient stated provider faxed order to [place] today. Once received call patient to schedule lab appointment

## 2019-01-02 NOTE — Telephone Encounter (Signed)
Tried to call patient mail box is full cannot receive message.

## 2019-01-04 ENCOUNTER — Other Ambulatory Visit (INDEPENDENT_AMBULATORY_CARE_PROVIDER_SITE_OTHER): Payer: BLUE CROSS/BLUE SHIELD

## 2019-01-04 ENCOUNTER — Other Ambulatory Visit: Payer: Self-pay

## 2019-01-04 ENCOUNTER — Telehealth: Payer: Self-pay

## 2019-01-04 DIAGNOSIS — I2721 Secondary pulmonary arterial hypertension: Secondary | ICD-10-CM

## 2019-01-04 NOTE — Telephone Encounter (Signed)
Copied from [place] [id number]. Topic: Appointment Scheduling - Scheduling Inquiry for Clinic >> Jan 04, 2019 11:45 AM Erick Blinks wrote: Reason for CRM: Pt called requesting appt for her lab orders. Tried calling office, please advise. She wants to be seen today Best contact: [phone]

## 2019-01-05 LAB — BASIC METABOLIC PANEL
BUN/Creatinine Ratio: 16 (ref 9–23)
BUN: 23 mg/dL (ref 6–24)
CO2: 29 mmol/L (ref 20–29)
Calcium: 9.2 mg/dL (ref 8.7–10.2)
Chloride: 94 mmol/L — ABNORMAL LOW (ref 96–106)
Creatinine, Ser: 1.41 mg/dL — ABNORMAL HIGH (ref 0.57–1.00)
GFR calc Af Amer: 49 mL/min/{1.73_m2} — ABNORMAL LOW (ref >59)
GFR calc non Af Amer: 43 mL/min/{1.73_m2} — ABNORMAL LOW (ref >59)
Glucose: 122 mg/dL — ABNORMAL HIGH (ref 65–99)
Potassium: 4.3 mmol/L (ref 3.5–5.2)
Sodium: 143 mmol/L (ref 134–144)

## 2019-01-05 LAB — PRO B NATRIURETIC PEPTIDE: NT-Pro BNP: 4514 pg/mL — ABNORMAL HIGH (ref 0–249)

## 2019-01-05 NOTE — Telephone Encounter (Signed)
Lab appointment has been scheduled

## 2019-01-05 NOTE — Telephone Encounter (Signed)
Pt called requesting appt for her lab orders.

## 2019-01-06 ENCOUNTER — Other Ambulatory Visit: Payer: Self-pay | Admitting: Internal Medicine

## 2019-01-06 ENCOUNTER — Telehealth: Payer: Self-pay | Admitting: Internal Medicine

## 2019-01-06 DIAGNOSIS — N179 Acute kidney failure, unspecified: Secondary | ICD-10-CM

## 2019-01-06 NOTE — Telephone Encounter (Signed)
Pt needs repeat lab visit again asap and fax to Dr. Lacinda Axon pulm  Call pt to schedule   [place]

## 2019-01-09 NOTE — Telephone Encounter (Signed)
Patient has appointment 0n 03-13-19

## 2019-01-09 NOTE — Addendum Note (Signed)
Addended by: Orland Mustard on: 01/09/2019 08:05 PM   Modules accepted: Orders

## 2019-01-11 ENCOUNTER — Other Ambulatory Visit (INDEPENDENT_AMBULATORY_CARE_PROVIDER_SITE_OTHER): Payer: BLUE CROSS/BLUE SHIELD

## 2019-01-11 ENCOUNTER — Other Ambulatory Visit: Payer: BLUE CROSS/BLUE SHIELD

## 2019-01-11 ENCOUNTER — Other Ambulatory Visit: Payer: Self-pay

## 2019-01-11 DIAGNOSIS — N179 Acute kidney failure, unspecified: Secondary | ICD-10-CM | POA: Diagnosis not present

## 2019-01-11 LAB — BASIC METABOLIC PANEL
BUN: 27 mg/dL — ABNORMAL HIGH (ref 6–23)
CO2: 32 mEq/L (ref 19–32)
Calcium: 9.2 mg/dL (ref 8.4–10.5)
Chloride: 95 mEq/L — ABNORMAL LOW (ref 96–112)
Creatinine, Ser: 1.42 mg/dL — ABNORMAL HIGH (ref 0.40–1.20)
GFR: 46.84 mL/min — ABNORMAL LOW (ref >60.00)
Glucose, Bld: 134 mg/dL — ABNORMAL HIGH (ref 70–99)
Potassium: 3.8 mEq/L (ref 3.5–5.1)
Sodium: 137 mEq/L (ref 135–145)

## 2019-01-13 DIAGNOSIS — I272 Pulmonary hypertension, unspecified: Secondary | ICD-10-CM | POA: Diagnosis not present

## 2019-01-13 DIAGNOSIS — Z6841 Body Mass Index (BMI) 40.0 and over, adult: Secondary | ICD-10-CM | POA: Diagnosis not present

## 2019-01-13 DIAGNOSIS — I50813 Acute on chronic right heart failure: Secondary | ICD-10-CM | POA: Diagnosis not present

## 2019-01-13 DIAGNOSIS — G4730 Sleep apnea, unspecified: Secondary | ICD-10-CM | POA: Diagnosis not present

## 2019-01-16 DIAGNOSIS — J9692 Respiratory failure, unspecified with hypercapnia: Secondary | ICD-10-CM | POA: Diagnosis not present

## 2019-01-16 DIAGNOSIS — M179 Osteoarthritis of knee, unspecified: Secondary | ICD-10-CM | POA: Diagnosis not present

## 2019-01-16 DIAGNOSIS — E8770 Fluid overload, unspecified: Secondary | ICD-10-CM | POA: Diagnosis not present

## 2019-01-16 DIAGNOSIS — K761 Chronic passive congestion of liver: Secondary | ICD-10-CM | POA: Diagnosis not present

## 2019-01-16 DIAGNOSIS — R0989 Other specified symptoms and signs involving the circulatory and respiratory systems: Secondary | ICD-10-CM | POA: Diagnosis not present

## 2019-01-16 DIAGNOSIS — G4733 Obstructive sleep apnea (adult) (pediatric): Secondary | ICD-10-CM | POA: Diagnosis not present

## 2019-01-16 DIAGNOSIS — R069 Unspecified abnormalities of breathing: Secondary | ICD-10-CM | POA: Diagnosis not present

## 2019-01-16 DIAGNOSIS — R918 Other nonspecific abnormal finding of lung field: Secondary | ICD-10-CM | POA: Diagnosis not present

## 2019-01-16 DIAGNOSIS — R0902 Hypoxemia: Secondary | ICD-10-CM | POA: Diagnosis not present

## 2019-01-16 DIAGNOSIS — C50912 Malignant neoplasm of unspecified site of left female breast: Secondary | ICD-10-CM | POA: Diagnosis not present

## 2019-01-16 DIAGNOSIS — I130 Hypertensive heart and chronic kidney disease with heart failure and stage 1 through stage 4 chronic kidney disease, or unspecified chronic kidney disease: Secondary | ICD-10-CM | POA: Diagnosis not present

## 2019-01-16 DIAGNOSIS — I471 Supraventricular tachycardia: Secondary | ICD-10-CM | POA: Diagnosis not present

## 2019-01-16 DIAGNOSIS — Z20828 Contact with and (suspected) exposure to other viral communicable diseases: Secondary | ICD-10-CM | POA: Diagnosis not present

## 2019-01-16 DIAGNOSIS — Z87891 Personal history of nicotine dependence: Secondary | ICD-10-CM | POA: Diagnosis not present

## 2019-01-16 DIAGNOSIS — I270 Primary pulmonary hypertension: Secondary | ICD-10-CM | POA: Diagnosis not present

## 2019-01-16 DIAGNOSIS — I499 Cardiac arrhythmia, unspecified: Secondary | ICD-10-CM | POA: Diagnosis not present

## 2019-01-16 DIAGNOSIS — J449 Chronic obstructive pulmonary disease, unspecified: Secondary | ICD-10-CM | POA: Diagnosis not present

## 2019-01-16 DIAGNOSIS — Z923 Personal history of irradiation: Secondary | ICD-10-CM | POA: Diagnosis not present

## 2019-01-16 DIAGNOSIS — I2699 Other pulmonary embolism without acute cor pulmonale: Secondary | ICD-10-CM | POA: Diagnosis not present

## 2019-01-16 DIAGNOSIS — E662 Morbid (severe) obesity with alveolar hypoventilation: Secondary | ICD-10-CM | POA: Diagnosis not present

## 2019-01-16 DIAGNOSIS — J9602 Acute respiratory failure with hypercapnia: Secondary | ICD-10-CM | POA: Diagnosis not present

## 2019-01-16 DIAGNOSIS — I517 Cardiomegaly: Secondary | ICD-10-CM | POA: Diagnosis not present

## 2019-01-16 DIAGNOSIS — E119 Type 2 diabetes mellitus without complications: Secondary | ICD-10-CM | POA: Diagnosis not present

## 2019-01-16 DIAGNOSIS — Z452 Encounter for adjustment and management of vascular access device: Secondary | ICD-10-CM | POA: Diagnosis not present

## 2019-01-16 DIAGNOSIS — I272 Pulmonary hypertension, unspecified: Secondary | ICD-10-CM | POA: Diagnosis not present

## 2019-01-16 DIAGNOSIS — J811 Chronic pulmonary edema: Secondary | ICD-10-CM | POA: Diagnosis not present

## 2019-01-16 DIAGNOSIS — R0600 Dyspnea, unspecified: Secondary | ICD-10-CM | POA: Diagnosis not present

## 2019-01-16 DIAGNOSIS — J9691 Respiratory failure, unspecified with hypoxia: Secondary | ICD-10-CM | POA: Diagnosis not present

## 2019-01-16 DIAGNOSIS — Z6841 Body Mass Index (BMI) 40.0 and over, adult: Secondary | ICD-10-CM | POA: Diagnosis not present

## 2019-01-16 DIAGNOSIS — I50813 Acute on chronic right heart failure: Secondary | ICD-10-CM | POA: Diagnosis not present

## 2019-01-16 DIAGNOSIS — I2782 Chronic pulmonary embolism: Secondary | ICD-10-CM | POA: Diagnosis not present

## 2019-01-16 DIAGNOSIS — I2721 Secondary pulmonary arterial hypertension: Secondary | ICD-10-CM | POA: Diagnosis not present

## 2019-01-16 DIAGNOSIS — N179 Acute kidney failure, unspecified: Secondary | ICD-10-CM | POA: Diagnosis not present

## 2019-01-18 ENCOUNTER — Telehealth: Payer: Self-pay | Admitting: Internal Medicine

## 2019-01-18 NOTE — Telephone Encounter (Signed)
are rheumatology referral and bone scan scheduled?

## 2019-01-20 MED ORDER — SILDENAFIL CITRATE 20 MG PO TABS
40.00 | ORAL_TABLET | ORAL | Status: DC
Start: 2019-01-20 — End: 2019-01-20

## 2019-01-20 MED ORDER — MAGNESIUM OXIDE 400 MG PO TABS
400.00 | ORAL_TABLET | ORAL | Status: DC
Start: 2019-01-20 — End: 2019-01-20

## 2019-01-20 MED ORDER — ANASTROZOLE 1 MG PO TABS
1.00 | ORAL_TABLET | ORAL | Status: DC
Start: 2019-02-05 — End: 2019-01-20

## 2019-01-20 MED ORDER — SPIRONOLACTONE 25 MG PO TABS
50.00 | ORAL_TABLET | ORAL | Status: DC
Start: 2019-01-21 — End: 2019-01-20

## 2019-01-20 MED ORDER — FUROSEMIDE 10 MG/ML IJ SOLN
80.00 | INTRAMUSCULAR | Status: DC
Start: 2019-01-20 — End: 2019-01-20

## 2019-01-20 MED ORDER — ALBUTEROL SULFATE (2.5 MG/3ML) 0.083% IN NEBU
2.50 | INHALATION_SOLUTION | RESPIRATORY_TRACT | Status: DC
Start: ? — End: 2019-01-20

## 2019-01-20 MED ORDER — ENOXAPARIN SODIUM 40 MG/0.4ML ~~LOC~~ SOLN
40.00 | SUBCUTANEOUS | Status: DC
Start: 2019-01-27 — End: 2019-01-20

## 2019-01-20 MED ORDER — MACITENTAN 10 MG PO TABS
10.00 | ORAL_TABLET | ORAL | Status: DC
Start: 2019-02-05 — End: 2019-01-20

## 2019-01-20 MED ORDER — METOPROLOL TARTRATE 25 MG PO TABS
25.00 | ORAL_TABLET | ORAL | Status: DC
Start: 2019-02-04 — End: 2019-01-20

## 2019-01-20 MED ORDER — ACETAMINOPHEN 325 MG PO TABS
650.00 | ORAL_TABLET | ORAL | Status: DC
Start: ? — End: 2019-01-20

## 2019-01-20 MED ORDER — ALLOPURINOL 300 MG PO TABS
300.00 | ORAL_TABLET | ORAL | Status: DC
Start: 2019-02-05 — End: 2019-01-20

## 2019-01-20 MED ORDER — LORATADINE 10 MG PO TABS
10.00 | ORAL_TABLET | ORAL | Status: DC
Start: 2019-02-05 — End: 2019-01-20

## 2019-01-24 DIAGNOSIS — I50813 Acute on chronic right heart failure: Secondary | ICD-10-CM | POA: Insufficient documentation

## 2019-01-24 DIAGNOSIS — N189 Chronic kidney disease, unspecified: Secondary | ICD-10-CM | POA: Insufficient documentation

## 2019-01-27 ENCOUNTER — Telehealth: Payer: Self-pay | Admitting: Internal Medicine

## 2019-01-27 MED ORDER — GENERIC EXTERNAL MEDICATION
12.50 | Status: DC
Start: ? — End: 2019-01-27

## 2019-01-27 MED ORDER — ACETAMINOPHEN 500 MG PO TABS
1000.00 | ORAL_TABLET | ORAL | Status: DC
Start: 2019-01-27 — End: 2019-01-27

## 2019-01-27 MED ORDER — LOPERAMIDE HCL 2 MG PO CAPS
2.00 | ORAL_CAPSULE | ORAL | Status: DC
Start: ? — End: 2019-01-27

## 2019-01-27 MED ORDER — FENTANYL CITRATE (PF) 50 MCG/ML IJ SOLN
INTRAMUSCULAR | Status: DC
Start: ? — End: 2019-01-27

## 2019-01-27 MED ORDER — LEVALBUTEROL HCL 0.31 MG/3ML IN NEBU
0.31 | INHALATION_SOLUTION | RESPIRATORY_TRACT | Status: DC
Start: ? — End: 2019-01-27

## 2019-01-27 MED ORDER — OXYCODONE HCL 5 MG PO TABS
5.00 | ORAL_TABLET | ORAL | Status: DC
Start: ? — End: 2019-01-27

## 2019-01-27 MED ORDER — SILDENAFIL CITRATE 20 MG PO TABS
20.00 | ORAL_TABLET | ORAL | Status: DC
Start: 2019-02-04 — End: 2019-01-27

## 2019-01-27 MED ORDER — GENERIC EXTERNAL MEDICATION
4.00 | Status: DC
Start: ? — End: 2019-01-27

## 2019-01-27 MED ORDER — ONDANSETRON HCL 4 MG/2ML IJ SOLN
4.00 | INTRAMUSCULAR | Status: DC
Start: ? — End: 2019-01-27

## 2019-01-27 MED ORDER — LIDOCAINE-EPINEPHRINE 1 %-1:100000 IJ SOLN
INTRAMUSCULAR | Status: DC
Start: ? — End: 2019-01-27

## 2019-01-27 MED ORDER — MAGNESIUM OXIDE 400 MG PO TABS
800.00 | ORAL_TABLET | ORAL | Status: DC
Start: 2019-01-27 — End: 2019-01-27

## 2019-01-27 NOTE — Telephone Encounter (Signed)
Is  bone scan scheduled? Need to be with in [place] or [facility] ?  TMS  

## 2019-02-03 DIAGNOSIS — I270 Primary pulmonary hypertension: Secondary | ICD-10-CM | POA: Diagnosis not present

## 2019-02-05 MED ORDER — RIVAROXABAN 15 MG PO TABS
15.00 | ORAL_TABLET | ORAL | Status: DC
Start: 2019-02-05 — End: 2019-02-05

## 2019-02-05 MED ORDER — GENERIC EXTERNAL MEDICATION
5.00 | Status: DC
Start: ? — End: 2019-02-05

## 2019-02-05 MED ORDER — TRAMADOL HCL 50 MG PO TABS
50.00 | ORAL_TABLET | ORAL | Status: DC
Start: ? — End: 2019-02-05

## 2019-02-05 MED ORDER — MAGNESIUM OXIDE 400 MG PO TABS
400.00 | ORAL_TABLET | ORAL | Status: DC
Start: 2019-02-04 — End: 2019-02-05

## 2019-02-05 MED ORDER — ACETAMINOPHEN 500 MG PO TABS
1000.00 | ORAL_TABLET | ORAL | Status: DC
Start: ? — End: 2019-02-05

## 2019-02-05 MED ORDER — TORSEMIDE 20 MG PO TABS
20.00 | ORAL_TABLET | ORAL | Status: DC
Start: 2019-02-05 — End: 2019-02-05

## 2019-02-05 MED ORDER — POLYETHYLENE GLYCOL 3350 17 G PO PACK
17.00 | PACK | ORAL | Status: DC
Start: ? — End: 2019-02-05

## 2019-02-08 DIAGNOSIS — I270 Primary pulmonary hypertension: Secondary | ICD-10-CM | POA: Diagnosis not present

## 2019-02-08 DIAGNOSIS — J9610 Chronic respiratory failure, unspecified whether with hypoxia or hypercapnia: Secondary | ICD-10-CM | POA: Diagnosis not present

## 2019-02-08 DIAGNOSIS — E662 Morbid (severe) obesity with alveolar hypoventilation: Secondary | ICD-10-CM | POA: Diagnosis not present

## 2019-02-09 DIAGNOSIS — I270 Primary pulmonary hypertension: Secondary | ICD-10-CM | POA: Diagnosis not present

## 2019-02-13 DIAGNOSIS — G4733 Obstructive sleep apnea (adult) (pediatric): Secondary | ICD-10-CM | POA: Diagnosis not present

## 2019-02-15 DIAGNOSIS — I2721 Secondary pulmonary arterial hypertension: Secondary | ICD-10-CM | POA: Diagnosis not present

## 2019-02-15 DIAGNOSIS — R0902 Hypoxemia: Secondary | ICD-10-CM | POA: Diagnosis not present

## 2019-02-15 DIAGNOSIS — I2782 Chronic pulmonary embolism: Secondary | ICD-10-CM | POA: Diagnosis not present

## 2019-02-15 DIAGNOSIS — I50812 Chronic right heart failure: Secondary | ICD-10-CM | POA: Diagnosis not present

## 2019-02-17 DIAGNOSIS — I270 Primary pulmonary hypertension: Secondary | ICD-10-CM | POA: Diagnosis not present

## 2019-02-19 ENCOUNTER — Telehealth: Payer: Self-pay | Admitting: Internal Medicine

## 2019-02-19 NOTE — Telephone Encounter (Signed)
Is  bone scan scheduled? Need to be with in [place] or [facility] [facility] ?  [name]

## 2019-02-22 ENCOUNTER — Telehealth: Payer: Self-pay | Admitting: Internal Medicine

## 2019-02-22 DIAGNOSIS — I270 Primary pulmonary hypertension: Secondary | ICD-10-CM | POA: Diagnosis not present

## 2019-02-22 NOTE — Telephone Encounter (Signed)
Is  bone scan scheduled? Need to be with in [place] or [facility] ?  TMS  

## 2019-02-24 DIAGNOSIS — I270 Primary pulmonary hypertension: Secondary | ICD-10-CM | POA: Diagnosis not present

## 2019-03-10 DIAGNOSIS — E662 Morbid (severe) obesity with alveolar hypoventilation: Secondary | ICD-10-CM | POA: Diagnosis not present

## 2019-03-10 DIAGNOSIS — J9610 Chronic respiratory failure, unspecified whether with hypoxia or hypercapnia: Secondary | ICD-10-CM | POA: Diagnosis not present

## 2019-03-10 DIAGNOSIS — I270 Primary pulmonary hypertension: Secondary | ICD-10-CM | POA: Diagnosis not present

## 2019-03-13 DIAGNOSIS — I270 Primary pulmonary hypertension: Secondary | ICD-10-CM | POA: Diagnosis not present

## 2019-03-13 DIAGNOSIS — T82524A Displacement of infusion catheter, initial encounter: Secondary | ICD-10-CM | POA: Diagnosis not present

## 2019-03-17 DIAGNOSIS — I2782 Chronic pulmonary embolism: Secondary | ICD-10-CM | POA: Diagnosis not present

## 2019-03-17 DIAGNOSIS — I10 Essential (primary) hypertension: Secondary | ICD-10-CM | POA: Diagnosis not present

## 2019-03-17 DIAGNOSIS — Z87891 Personal history of nicotine dependence: Secondary | ICD-10-CM | POA: Diagnosis not present

## 2019-03-17 DIAGNOSIS — D649 Anemia, unspecified: Secondary | ICD-10-CM | POA: Diagnosis not present

## 2019-03-17 DIAGNOSIS — E119 Type 2 diabetes mellitus without complications: Secondary | ICD-10-CM | POA: Diagnosis not present

## 2019-03-17 DIAGNOSIS — G4733 Obstructive sleep apnea (adult) (pediatric): Secondary | ICD-10-CM | POA: Diagnosis not present

## 2019-03-17 DIAGNOSIS — C50912 Malignant neoplasm of unspecified site of left female breast: Secondary | ICD-10-CM | POA: Diagnosis not present

## 2019-03-17 DIAGNOSIS — I50812 Chronic right heart failure: Secondary | ICD-10-CM | POA: Diagnosis not present

## 2019-03-17 DIAGNOSIS — I2721 Secondary pulmonary arterial hypertension: Secondary | ICD-10-CM | POA: Diagnosis not present

## 2019-03-17 DIAGNOSIS — Z7901 Long term (current) use of anticoagulants: Secondary | ICD-10-CM | POA: Diagnosis not present

## 2019-03-17 DIAGNOSIS — R911 Solitary pulmonary nodule: Secondary | ICD-10-CM | POA: Diagnosis not present

## 2019-03-17 DIAGNOSIS — Z79899 Other long term (current) drug therapy: Secondary | ICD-10-CM | POA: Diagnosis not present

## 2019-03-17 DIAGNOSIS — Z6837 Body mass index (BMI) 37.0-37.9, adult: Secondary | ICD-10-CM | POA: Diagnosis not present

## 2019-03-17 DIAGNOSIS — D751 Secondary polycythemia: Secondary | ICD-10-CM | POA: Diagnosis not present

## 2019-03-21 ENCOUNTER — Other Ambulatory Visit: Payer: Self-pay | Admitting: Internal Medicine

## 2019-03-21 MED ORDER — IBUPROFEN 800 MG PO TABS: 800.0000 mg | ORAL_TABLET | Freq: Three times a day (TID) | ORAL | 0 refills | Status: DC | PRN

## 2019-03-29 DIAGNOSIS — I270 Primary pulmonary hypertension: Secondary | ICD-10-CM | POA: Diagnosis not present

## 2019-03-29 DIAGNOSIS — N189 Chronic kidney disease, unspecified: Secondary | ICD-10-CM | POA: Diagnosis not present

## 2019-03-29 DIAGNOSIS — W19XXXA Unspecified fall, initial encounter: Secondary | ICD-10-CM | POA: Diagnosis not present

## 2019-03-29 DIAGNOSIS — I129 Hypertensive chronic kidney disease with stage 1 through stage 4 chronic kidney disease, or unspecified chronic kidney disease: Secondary | ICD-10-CM | POA: Diagnosis not present

## 2019-03-29 DIAGNOSIS — Z452 Encounter for adjustment and management of vascular access device: Secondary | ICD-10-CM | POA: Diagnosis not present

## 2019-03-29 DIAGNOSIS — E1122 Type 2 diabetes mellitus with diabetic chronic kidney disease: Secondary | ICD-10-CM | POA: Diagnosis not present

## 2019-03-29 DIAGNOSIS — Z789 Other specified health status: Secondary | ICD-10-CM | POA: Diagnosis not present

## 2019-03-29 DIAGNOSIS — M109 Gout, unspecified: Secondary | ICD-10-CM | POA: Diagnosis not present

## 2019-03-29 DIAGNOSIS — Z79811 Long term (current) use of aromatase inhibitors: Secondary | ICD-10-CM | POA: Diagnosis not present

## 2019-03-29 DIAGNOSIS — Z880 Allergy status to penicillin: Secondary | ICD-10-CM | POA: Diagnosis not present

## 2019-03-29 DIAGNOSIS — J9611 Chronic respiratory failure with hypoxia: Secondary | ICD-10-CM | POA: Diagnosis not present

## 2019-03-29 DIAGNOSIS — T82524A Displacement of infusion catheter, initial encounter: Secondary | ICD-10-CM | POA: Diagnosis not present

## 2019-03-29 DIAGNOSIS — G4733 Obstructive sleep apnea (adult) (pediatric): Secondary | ICD-10-CM | POA: Diagnosis not present

## 2019-03-29 DIAGNOSIS — I5032 Chronic diastolic (congestive) heart failure: Secondary | ICD-10-CM | POA: Diagnosis not present

## 2019-03-29 DIAGNOSIS — J449 Chronic obstructive pulmonary disease, unspecified: Secondary | ICD-10-CM | POA: Diagnosis not present

## 2019-03-29 DIAGNOSIS — I272 Pulmonary hypertension, unspecified: Secondary | ICD-10-CM | POA: Diagnosis not present

## 2019-03-29 DIAGNOSIS — Z20828 Contact with and (suspected) exposure to other viral communicable diseases: Secondary | ICD-10-CM | POA: Diagnosis not present

## 2019-03-29 DIAGNOSIS — I10 Essential (primary) hypertension: Secondary | ICD-10-CM | POA: Diagnosis not present

## 2019-03-29 DIAGNOSIS — Z87891 Personal history of nicotine dependence: Secondary | ICD-10-CM | POA: Diagnosis not present

## 2019-03-29 DIAGNOSIS — R519 Headache, unspecified: Secondary | ICD-10-CM | POA: Diagnosis not present

## 2019-03-29 DIAGNOSIS — R0902 Hypoxemia: Secondary | ICD-10-CM | POA: Diagnosis not present

## 2019-03-29 DIAGNOSIS — T82598A Other mechanical complication of other cardiac and vascular devices and implants, initial encounter: Secondary | ICD-10-CM | POA: Diagnosis not present

## 2019-03-29 DIAGNOSIS — I110 Hypertensive heart disease with heart failure: Secondary | ICD-10-CM | POA: Diagnosis not present

## 2019-03-30 DIAGNOSIS — I272 Pulmonary hypertension, unspecified: Secondary | ICD-10-CM | POA: Diagnosis not present

## 2019-03-30 DIAGNOSIS — Z789 Other specified health status: Secondary | ICD-10-CM | POA: Diagnosis not present

## 2019-03-30 DIAGNOSIS — Z452 Encounter for adjustment and management of vascular access device: Secondary | ICD-10-CM | POA: Diagnosis not present

## 2019-03-30 DIAGNOSIS — T82524A Displacement of infusion catheter, initial encounter: Secondary | ICD-10-CM | POA: Diagnosis not present

## 2019-03-31 DIAGNOSIS — I270 Primary pulmonary hypertension: Secondary | ICD-10-CM | POA: Diagnosis not present

## 2019-04-16 ENCOUNTER — Telehealth: Payer: Self-pay | Admitting: Internal Medicine

## 2019-04-16 ENCOUNTER — Other Ambulatory Visit: Payer: Self-pay | Admitting: Internal Medicine

## 2019-04-16 MED ORDER — IBUPROFEN 800 MG PO TABS: 800.0000 mg | ORAL_TABLET | Freq: Three times a day (TID) | ORAL | 0 refills | Status: DC | PRN

## 2019-04-16 NOTE — Telephone Encounter (Signed)
Hematology and Oncology - 3rd fl [place], [place] [address]  [phone]  [address], [place], [place] [address]  [phone]  [phone] (Fax)     Fax my last note above on cover attn Dr. Merrie Roof  Pt with elevated alkaline phos  Can you order a bone scan please with history of breast cancer?    [place]

## 2019-04-16 NOTE — Telephone Encounter (Signed)
Fax my last note to Dr. Quincy Simmonds attm  10/18/2018 Telephone [facility]  [address]  [address] [place], [place] [address]  [place], Christean Grief, [place] [address] [place], [place] [address]  [phone]  [phone] (Fax)  Rheumatology New Referral     1. Pt needs bone scan due to elevated alkaline phos with h/o breast cancer  2. Cardiology referral with pulmonary HTN, HTN, edema  3. Rheumatology referral    Can he help coordinate this with her?      _____________________________________________ Also call pt and see if insurance changed or still the same and does she have to have [facility]?   [place]

## 2019-04-17 ENCOUNTER — Other Ambulatory Visit: Payer: Self-pay | Admitting: Internal Medicine

## 2019-04-17 ENCOUNTER — Encounter: Payer: Self-pay | Admitting: Internal Medicine

## 2019-04-17 DIAGNOSIS — J9611 Chronic respiratory failure with hypoxia: Secondary | ICD-10-CM

## 2019-04-17 DIAGNOSIS — I272 Pulmonary hypertension, unspecified: Secondary | ICD-10-CM

## 2019-04-17 DIAGNOSIS — M1A9XX0 Chronic gout, unspecified, without tophus (tophi): Secondary | ICD-10-CM

## 2019-04-17 DIAGNOSIS — I10 Essential (primary) hypertension: Secondary | ICD-10-CM

## 2019-04-17 DIAGNOSIS — I517 Cardiomegaly: Secondary | ICD-10-CM

## 2019-04-17 NOTE — Telephone Encounter (Signed)
This message was included in the fax  Hematology and Oncology - 3rd fl [place], [place] [address]   [place], [place], [place],  [address]   [phone]   [phone] (Fax)        Fax my last note above on cover attn Dr. Merrie Roof  Pt with elevated alkaline phos  Can you order a bone scan please with history of breast cancer?

## 2019-04-17 NOTE — Telephone Encounter (Signed)
Can you schedule bone scan please ive been trying to schedule this for months  [facility]   Also refer cardiology [facility] in [place] and rheumatology in [place] pt has new insurance please put in computer    Thanks [place]

## 2019-04-17 NOTE — Telephone Encounter (Signed)
Did you put this on the cover not below as well?   [place]

## 2019-04-17 NOTE — Telephone Encounter (Signed)
Last Note faxed over to office.

## 2019-04-17 NOTE — Telephone Encounter (Signed)
Called Pt and she stated her insurance has changed it is [employer] and [employer]. Pt also stated No she does not have to go to [place]. I also faxed over your last notes to Dr.Silva

## 2019-04-18 NOTE — Telephone Encounter (Signed)
I have not been successful with getting her scheduled at [facility], I have been trying for months as well. I will try to get this scheduled in [place] and let her know. Melissa

## 2019-04-19 ENCOUNTER — Ambulatory Visit: Payer: BC Managed Care – PPO | Admitting: Cardiology

## 2019-04-19 ENCOUNTER — Other Ambulatory Visit: Payer: Self-pay

## 2019-04-19 VITALS — BP 118/72 | HR 76 | Ht 65.0 in | Wt 226.0 lb

## 2019-04-19 DIAGNOSIS — R0609 Other forms of dyspnea: Secondary | ICD-10-CM

## 2019-04-19 DIAGNOSIS — I517 Cardiomegaly: Secondary | ICD-10-CM | POA: Diagnosis not present

## 2019-04-19 DIAGNOSIS — G4733 Obstructive sleep apnea (adult) (pediatric): Secondary | ICD-10-CM

## 2019-04-19 DIAGNOSIS — J9611 Chronic respiratory failure with hypoxia: Secondary | ICD-10-CM

## 2019-04-19 DIAGNOSIS — R0600 Dyspnea, unspecified: Secondary | ICD-10-CM | POA: Diagnosis not present

## 2019-04-19 DIAGNOSIS — I272 Pulmonary hypertension, unspecified: Secondary | ICD-10-CM | POA: Diagnosis not present

## 2019-04-19 DIAGNOSIS — Z9989 Dependence on other enabling machines and devices: Secondary | ICD-10-CM

## 2019-04-19 DIAGNOSIS — R600 Localized edema: Secondary | ICD-10-CM | POA: Diagnosis not present

## 2019-04-19 NOTE — Patient Instructions (Signed)
Medication Instructions:  CONTINUE WITH CURRENT MEDICATIONS *If you need a refill on your cardiac medications before your next appointment, please call your pharmacy*  Lab Work: NONE If you have labs (blood work) drawn today and your tests are completely normal, you will receive your results only by: Marland Kitchen MyChart Message (if you have MyChart) OR . A paper copy in the mail If you have any lab test that is abnormal or we need to change your treatment, we will call you to review the results.   Follow-Up: At [facility], you and your health needs are our priority.  As part of our continuing mission to provide you with exceptional heart care, we have created designated Provider Care Teams.  These Care Teams include your primary Cardiologist (physician) and Advanced Practice Providers (APPs -  Physician Assistants and Nurse Practitioners) who all work together to provide you with the care you need, when you need it.  Your next appointment:   6 month(s)  The format for your next appointment:   Either In Person or Virtual  Provider:   Buford Dresser, MD

## 2019-04-19 NOTE — Progress Notes (Signed)
Cardiology Office Note:    Date:  04/19/2019   ID:  [name], DOB January 01, 1966, MRN [id number]  PCP:  McLean-Scocuzza, Nino Glow, MD  Cardiologist:  Dr. Harrietta [facility], [facility] pulmonology follows [facility] [name]  Referring MD: McLean-Scocuzza, [name] *   CC: "I'm in [place], so in case I get admitted I should know [facility] doctors"  History of Present Illness:    [name] is a 54 y.o. female with a hx of [place] (see RHC below), PE/DVT 2014, OSA, hypertension, type II diabetes, morbid obesity, breast cancer who is seen as a new consult at the request of McLean-Scocuzza, [name] * for the evaluation and management of pulmonary hypertension.  Per notes reviewed from Dr. Terese Fitzgerald, patient has a history of pulmonary hypertension followed by [facility] pulmonology. She has OSA, chronic respiratory failure with hypoxia on home O2, chronic cough followed by pulmonology. At a visit 11/2018 she was noted to have increased weight gain ~30 lbs and LE edema. She was on spironolactone and torsemide for this.   I reviewed care everywhere records today prior to her appt. There is a missed cardiology appt 11/17/18 for [name], and last prior cardiology visit was on 06/02/18 with Dr. Harrietta [facility] at [facility]. This was re: right heart cath. Per the notes, she was diagnosed with WHO stage 1 PH (PAH) and was being treated with macitentan, sildenafil, and selexipag. There is detailed documentation re: transition to parenteral therapy for her PAH. Recommendation was to start remodulin.   [place] 06/02/18: Hemodynamics: Heart rate: 98  Systemic BP: 177/107  Mean arterial pressure: 130  RA a wave: 14 RA v wave: 12 RA mean: 11  RV (s/d/ed): 76/6/14  PA: 80/34  PA mean: 53  PCW a wave: 10 PCW v wave: 11 PCW mean: 8  PA sat: 61%  Arterial sat: 94%  Hemoglobin: 13.8 Body surface area: 2.55m2  Cardiac Output 4.48 L/min (Fick) Cardiac Index 2.15 L/min/m2 (Fick)  Cardiac Output 4.11 L/min (Thermodilution) Cardiac Index 1.97 L/min/m2  (Thermodilution)  PVR: 10 Wood Units/ 803 dyn-sec/cm5 (Fick) SVR: 2130 dyn-sec/cm5 (Fick)   PVR: 10.9 Wood Units/ 876 dyn-sec/cm5 (Thermodilution) SVR: 2320 dyn-sec/cm5 (Thermodilution)    Notes on prior workup from Care Everywhere: [place]/2017 (mPAP 51, PCW 11, PVR 11.6 WU). Previous RHC at OSH 11/2015 (mPAP 55, PCWP 18-20, PVR 6, Ao sat 79%).   * Sildenafil 40 mg TID, since 06/2017 * Selexipag 4043m BID, since 05/2016  * Macitentan 10 mg daily, since 03/2016 - Sildenafil 20 mg TID, 02/2016 to 06/2017, dose increased Reported prior PA angiogram not consistent with CTEPH  Cardiac testing summary:  TTE 05/02/18 (reviewed): LA 37, EF 55% RV dil/HK, tr TR, can't est PASP IVC dil, poor insp collapse TTE 05/2017 ([facility]): LVEF 60-65% RVH, RV sev dil, mod HK, tr TR, can't est PASP No effusion. IVC dil/poor insp collapse TTE 12/2016 (cone): LA 35, EF 55-60%, LVH (11-12) RV dil/HK, TAPSE 1.6, est PASP "within normal range" No effusion. IVC normal size TTE 07/2016 (cone): LA 36, EF 55-60% RV normal, est PASP "within normal range" No effusion. IVC dil, normal resp variation RHC 06/2016: RA 5, PA 56/22 (36), PCW 9 CO/CI 5.6/2.7 (td), PA sat 67%, PVR 4.8 WU, PAC 2.3 TTE 03/2016: LA 42, EF 60-65% RV dil/HK, TAPSE 18-19, can't est PASP No effusion. Dilated IVC with poor insp collapse. No IV access for bubble RHC/PA gram 02/2016: RA 15, RV 80/11 (17), PA 80/40 (51), PCW 11 RA with little resp variation PCW with large resp variation, end  exp 15-16 mean resp cycle (more accurate with obesity) 11 CO/CI 4.3/2.0 (td*), 5.4/2.6 (f), PA 59%, Ao 86% (higher thruout case) PVR 11.6 WU (td) PA gram with minimal luminal irregularities (?RUL decr perfusion) overall not concerning for significant CTED TTE 01/2016 ([facility]): LA 35, EF 65% RV dilated, TAPSE 19, tr Tr, can't est PASP RHC 11/2015 ([facility]): RA 16, PA 81/36 (55), PCW 18-20 CO/CI 5.8/2.7 (f), PA sat 58%, Ao sat 79%(?), PVR ~6 Given 5 mg verapamil and  1000 ug NTG without change in pressures  CTA chest 01/2016: No filling defect within the central or segmental PAs.  Marked enlargement of the pulmonary artery trunk, 5 cm Heart size is mildly enlarged with asymmetric enlargement of RA/RV Prominent right hilar lymph nodes measuring up to 1.6 cm. Nonspecific sub cm mediastinal nodes.  Patchy areas of hazy attenuation could relate to atelectasis versus small airways disease.  4 mm left lower pulmonary nodule  V/Q scan 11/2015: heterogeneous distribution of ventilation  large ventilation defects are noted to the lateral RLL, right upper and the left apex. several large segmental perfusion defects are noted involving the RUL, RLL.  Corresponding ventilation abnormalities noted as above. Intermediate probability for pulmonary embolus.   6MWT 05/19/18: 280 meters. HR 90->130, O2 sat 96% RA->93% RA. Max Borg 6. 6MWT 05/2017: 305 meters. HR 91->136, O2 sat 100% RA->93% RA. Max Borg 4. Exertional oximetry 08/2016: 1 flight, mod pace. O2 sat 95% RA->84% RA 6MWT 08/2016: 320 meters, HR to 119, lowest sat 90% 6MWT 03/2016: 236 meters, HR 78->102, O2 sat 88% Ra, 93% 2L->88% 2l, ->88% 3l, -->95% 4L 6MWT 01/2016: 304 meters. HR 77->102, O2 sat 92-95% RA  Pulmonary Function Testing: Date FEV1 FVC TLC FRC RV DLCO   01/2016 1.74 (71%) 2.16 (71%) 14.2 (61%)  06/2014 1.93 (78%) 2.32 (76%) 16 (62%)    Overnight oximetry 06/2017 (CPAP/O2): low sats, 87% avg, 77% nadir, per patient accidentally on 2LPM instead of 6 LPM, reordered. PSG 06/2013 significant for AHI 33 and severe hypoxemia (sat nadir 42%, almost all night <90%).  Relevant Serologies:  Labs 12/2015: ANA neg, RF/CCP neg, ANCA neg HIV neg   Today:  She does not wish to change her primary provider Dr. Harrietta Fitzgerald at [facility]. She has previously seen advanced heart failure here (Dr. Haroldine Laws) but not generally cardiology. I discussed what role she would like [facility] to fill, and her request was "I'm in  [place], so in case I get admitted I should know [facility] doctors".   Feeling overall fairly decent today. Feels that "for my condition I'm doing good." Notes that she can go grocery shopping, do ADLs, housework, etc. It takes her some time but she can get it done. No syncope. Use to have presyncope regularly but none in some time. Not sure what improved this for her. She wonders if it was when her cough went away since she went on oxygen.   No chest pain in many years, none specifically since starting remodulin. This is being titrated, most recently this week. She was admitted 01/2019 for right heart failure, started on remodulin at this time. Now on 20.  Intermittently has issues with edema. Now taking 80 mg torsemide, which seems to be keeping weight stable. Still feels that legs and fingers can be tight, even though her legs/fingers lok ok. She tends to put fluid in her abdomen when volume overloaded, and she watches this closely. Weight is always stable.  Using trilogy respiratory system at night, does not know  settings. Feels much better since starting this.   On rivaroxaban, tolerating well, no issues with bleeding (other than occasional nosebleeds). She has been told she is anemic, prior to this her red count was elevated.  She was last seen by Dr. Haroldine Laws in pulmonary hypertension clinic 04/2018, had right heart cath 06/02/2018. At that time, she was still following at [facility] for pulmonary hypertension, and care was deferred to the [facility] team at that time. She plans to continue seeing Dr. Harrietta Fitzgerald for her pulmonary hypertension, but she wants to make sure if she is ill here that we know about her.   Past Medical History:  Diagnosis Date  . Anemia    iron deficiency  . Arthritis   . Borderline diabetes   . Complication of anesthesia    woke up slowly- after hysterectomy- 2015  . Diabetes mellitus, type II ([place])   . DVT (deep venous thrombosis) ([place]) 2014   left leg  . Family history of  breast cancer   . Gout   . HTN (hypertension)   . Malignant neoplasm of upper-inner quadrant of left female breast ([place]) 03/06/2016  . Menorrhagia    secondary to uterine fibroids  . OSA (obstructive sleep apnea)    07/25/13 HST AHI 33/hr, severe hypoxemia O2 min 42% and 95% of the time <89%  . PE (pulmonary embolism) 2014   bilateral  . Pulmonary artery hypertension ([place])   . Pulmonary nodule    (46m on loeft lower lobe) found on CT scan July 2014, repeat scan Jan 2015 showed less than 458m . Right ovarian cyst    noted 09/2012   . S/P TAH (total abdominal hysterectomy) 06/07/2013  . Trichomoniasis    05/2011     Past Surgical History:  Procedure Laterality Date  . ABDOMINAL HYSTERECTOMY N/A 06/07/2013   Procedure: HYSTERECTOMY ABDOMINAL WITH BIALTERAL SALPINGECTOMY;  Surgeon: VaElveria RoyalsMD;  [facility];  Service: Gynecology;  Laterality: N/A;  . BIOPSY  09/02/2017   Procedure: BIOPSY;  Surgeon: ScWilford CornerMD;  [facility]: [facility];  Service: Endoscopy;;  . BREAST LUMPECTOMY Left 07/27/2016   BREAST LUMPECTOMY WITH RADIOACTIVE SEED AND SENTINEL LYMPH NODE BIOPSY (Left)  . BREAST LUMPECTOMY WITH RADIOACTIVE SEED AND SENTINEL LYMPH NODE BIOPSY Left 07/27/2016   Procedure: BREAST LUMPECTOMY WITH RADIOACTIVE SEED AND SENTINEL LYMPH NODE BIOPSY;  Surgeon: FaStark KleinMD;  [facility]: General;  Laterality: Left;  . CARDIAC CATHETERIZATION N/A 12/24/2015   Procedure: Right Heart Cath;  Surgeon: JaAdrian ProwsMD;  [facility];  Service: Cardiovascular;  Laterality: N/A;  . CESAREAN SECTION    . COLONOSCOPY    . COLONOSCOPY WITH PROPOFOL N/A 09/02/2017   Procedure: COLONOSCOPY WITH PROPOFOL;  Surgeon: ScWilford CornerMD;  [facility];  Service: Endoscopy;  Laterality: N/A;  . ESOPHAGOGASTRODUODENOSCOPY (EGD) WITH PROPOFOL N/A 09/02/2017   Procedure: ESOPHAGOGASTRODUODENOSCOPY (EGD) WITH PROPOFOL;  Surgeon: ScWilford CornerMD;   [facility];  Service: Endoscopy;  Laterality: N/A;  . IR CV LINE INJECTION  07/27/2017  . POLYPECTOMY  2008   Removal of uterine polyp  . PORT-A-CATH REMOVAL Right 09/09/2017   Procedure: REMOVAL PORT-A-CATH;  Surgeon: ByStark KleinMD;  [facility] Service: General;  Laterality: Right;  . PORTA CATH INSERTION  07/27/2016  . PORTACATH PLACEMENT Right 07/27/2016   Procedure: INSERTION PORT-A-CATH;  Surgeon: FaStark KleinMD;  [facility]: General;  Laterality: Right;  .  SUBMUCOSAL INJECTION  09/02/2017   Procedure: SUBMUCOSAL INJECTION;  Surgeon: Wilford Corner, MD;  [facility];  Service: Endoscopy;;  epi injection    Current Medications: Current Outpatient Medications on File Prior to Visit  Medication Sig  . acetaminophen (TYLENOL) 500 MG tablet Take 1,000 mg by mouth every 6 (six) hours as needed for moderate pain or headache.   . albuterol (PROVENTIL HFA;VENTOLIN HFA) 108 (90 Base) MCG/ACT inhaler Inhale 2 puffs into the lungs every 6 (six) hours as needed for wheezing or shortness of breath.  . allopurinol (ZYLOPRIM) 300 MG tablet Take 1 tablet (300 mg total) by mouth daily.  Marland Kitchen anastrozole (ARIMIDEX) 1 MG tablet Take 1 mg by mouth daily.  Marland Kitchen azelastine (ASTELIN) 0.1 % nasal spray Place 1 spray into both nostrils 2 (two) times daily. Use in each nostril as directed  . bismuth subsalicylate (PEPTO-BISMOL) 262 MG/15ML suspension Take 30 mLs by mouth 4 (four) times daily -  before meals and at bedtime. X 2 weeks  . chlorpheniramine-HYDROcodone (TUSSIONEX PENNKINETIC ER) 10-8 MG/5ML SUER Take 5 mLs by mouth at bedtime as needed for cough.  . Cholecalciferol 50000 units capsule Take 1 capsule (50,000 Units total) by mouth once a week.  . Colchicine (MITIGARE) 0.6 MG CAPS Take 1 capsule by mouth as needed. As needed gout flare max # of pills 2 in 24 hours  . fluticasone (FLONASE) 50 MCG/ACT nasal spray Place 2 sprays into both  nostrils daily.   . Fluticasone-Salmeterol (ADVAIR DISKUS) 250-50 MCG/DOSE AEPB Inhale 1 puff into the lungs 2 (two) times daily. Rinse mouth  . ipratropium-albuterol (DUONEB) 0.5-2.5 (3) MG/3ML SOLN Take 3 mLs by nebulization every 4 (four) hours as needed.  . loratadine (CLARITIN) 10 MG tablet Take 10 mg by mouth daily.  . metoprolol tartrate (LOPRESSOR) 25 MG tablet Take 25 mg by mouth daily.  . Multiple Vitamin (MULTIVITAMIN) tablet Take 1 tablet by mouth daily.  . OPSUMIT 10 MG TABS Take 10 mg by mouth daily.  Vladimir Faster Glycol-Propyl Glycol (SYSTANE OP) Place 1 drop into both eyes daily as needed (dry eyes).   . sildenafil (REVATIO) 20 MG tablet Take 40 mg by mouth 3 (three) times daily.   Marland Kitchen torsemide (DEMADEX) 20 MG tablet Take 60 mg by mouth daily as needed.   Marland Kitchen treprostinil (REMODULIN) 20 MG/20ML injection 20 ng/kg/min by Continuous infusion (non-IV) route continuous.  . triamcinolone cream (KENALOG) 0.1 % Apply 1 application topically daily as needed (leg discoloration).  Marland Kitchen UPTRAVI 400 MCG TABS Take 400 mcg by mouth 2 (two) times daily.   . [DISCONTINUED] prochlorperazine (COMPAZINE) 10 MG tablet Take 1 tablet (10 mg total) by mouth every 6 (six) hours as needed (Nausea or vomiting).   Current Facility-Administered Medications on File Prior to Visit  Medication  . heparin lock flush 100 unit/mL  . sodium chloride flush (NS) 0.9 % injection 10 mL     Allergies:   Ampicillin, Amoxicillin, Indomethacin, Nsaids, Metronidazole, and Sulfa antibiotics   Social History   Tobacco Use  . Smoking status: Former Smoker    Packs/day: 0.33    Years: 10.00    Pack years: 3.30    Types: Cigarettes    Quit date: 10/04/2011    Years since quitting: 7.5  . Smokeless tobacco: Never Used  Substance Use Topics  . Alcohol use: Yes    Alcohol/week: 0.0 standard drinks    Comment: social  . Drug use: No    Family History: family history includes  Breast cancer in her cousin, maternal  grandmother, paternal aunt, paternal aunt, and paternal grandmother; Cancer in her maternal aunt and other family members; Colon cancer (age of onset: 61) in an other family member; Hypertension in her brother, father, and mother; Kidney disease in her mother; Stroke in her sister.  ROS:   Please see the history of present illness.  Additional pertinent ROS: Constitutional: Negative for chills, fever, night sweats, unintentional weight loss  HENT: Negative for ear pain and hearing loss.   Eyes: Negative for loss of vision and eye pain.  Respiratory: Negative for cough, sputum, wheezing.   Cardiovascular: See HPI. Gastrointestinal: Negative for abdominal pain, melena, and hematochezia.  Genitourinary: Negative for dysuria and hematuria.  Musculoskeletal: Negative for falls and myalgias.  Skin: Negative for itching and rash.  Neurological: Negative for focal weakness, focal sensory changes and loss of consciousness.  Endo/Heme/Allergies: Does not bruise/bleed easily.     EKGs/Labs/Other Studies Reviewed:    The following studies were reviewed today: See HPI for details  EKG:  EKG is personally reviewed.  The ekg ordered today demonstrates sinus rhythm with PAC, RAE, RAD, RVH, ST-T changes consistent with strain  Recent Labs: 12/22/2018: ALT 15; Hemoglobin 13.4; Magnesium 1.3; Platelets 147.0; TSH 2.87 01/04/2019: NT-Pro BNP 4,514 01/11/2019: BUN 27; Creatinine, Ser 1.42; Potassium 3.8; Sodium 137  Recent Lipid Panel    Component Value Date/Time   CHOL 119 12/22/2018 1441   CHOL 148 06/22/2017 1053   TRIG 99.0 12/22/2018 1441   HDL 30.00 (L) 12/22/2018 1441   HDL 36 (L) 06/22/2017 1053   CHOLHDL 4 12/22/2018 1441   VLDL 19.8 12/22/2018 1441   LDLCALC 69 12/22/2018 1441   LDLCALC 86 06/22/2017 1053    Physical Exam:    VS:  BP 118/72   Pulse 76   Ht _0  (1.651 m)   Wt 226 lb (102.5 kg)   LMP 05/11/2013   SpO2 94%   BMI 37.61 kg/m     Wt Readings from Last 3  Encounters:  04/19/19 226 lb (102.5 kg)  12/22/18 255 lb (115.7 kg)  06/21/18 226 lb 9.6 oz (102.8 kg)    GEN: Well nourished, well developed in no acute distress HEENT: Normal, moist mucous membranes. O2 nasal cannula in place NECK: No JVD CARDIAC: regular rhythm, normal S1 and S2, no rubs or gallops. No murmurs. VASCULAR: Radial and DP pulses 2+ bilaterally. No carotid bruits RESPIRATORY:  Faint crackles at bilateral bases but no wheezing ABDOMEN: Soft, non-tender, non-distended MUSCULOSKELETAL:  Ambulates independently SKIN: Warm and dry, 1+ bilateral LE edema NEUROLOGIC:  Alert and oriented x 3. No focal neuro deficits noted. PSYCHIATRIC:  Normal affect    ASSESSMENT:    1. Pulmonary HTN (HCC)   2. Cardiomegaly   3. Dyspnea on exertion   4. Bilateral leg edema   5. Chronic respiratory failure with hypoxia (HCC)   6. OSA on CPAP    PLAN:    Known pulmonary hypertension, felt to be WHO class I on remodulin, followed by Dr. Harrietta Fitzgerald. Her cardiomegaly, dyspnea on exertion, and LE edema are sequelae of this disease. Chronic hypoxic respiratory failure on O2 and OSA on CPAP. -I discussed with her that I do not manage remodulin; she would need to return to followup with Dr. Haroldine Laws for this. She does not plan to change her care from Dr. Harrietta Fitzgerald and only wished to establish care with general cardiology as she lives in [place] and would prefer to come to [facility]  if she gets ill -if admitted, she would need to be followed by advanced heart failure given remodulin infusion -she is on home O2 for her chronic respiratory failure and CPAP for her OSA -NYHA class 3, as she is winded easily with minor household activities -per notes, CTEPH not felt to be strong contributor -uptitrating remodulin infusion and has upcoming appt with Dr. Harrietta Fitzgerald for follow up.  Overall she is receiving excellent care through the [facility] team. I recommended she only follow with [place] PRN so as not to confuse treatment  plans. She would prefer to check in at 6 mos and make sure she has no further general cardiology conditions. We can discuss prevention strategies and CV risk at that time.   Plan for follow up: 6 mos  Total time of encounter: 65 minutes total time of encounter, including 50 minutes spent in face-to-face patient care. This time includes coordination of care and counseling regarding pulmonary hypertension. Remainder of non-face-to-face time involved reviewing chart documents/testing relevant to the patient encounter and documentation in the medical record.  Buford Dresser, MD, PhD [place]  [facility]    Medication Adjustments/Labs and Tests Ordered: Current medicines are reviewed at length with the patient today.  Concerns regarding medicines are outlined above.  Orders Placed This Encounter  Procedures  . EKG 12-Lead   No orders of the defined types were placed in this encounter.   Patient Instructions  Medication Instructions:  CONTINUE WITH CURRENT MEDICATIONS *If you need a refill on your cardiac medications before your next appointment, please call your pharmacy*  Lab Work: NONE If you have labs (blood work) drawn today and your tests are completely normal, you will receive your results only by: Marland Kitchen MyChart Message (if you have MyChart) OR . A paper copy in the mail If you have any lab test that is abnormal or we need to change your treatment, we will call you to review the results.   Follow-Up: At [facility], you and your health needs are our priority.  As part of our continuing mission to provide you with exceptional heart care, we have created designated Provider Care Teams.  These Care Teams include your primary Cardiologist (physician) and Advanced Practice Providers (APPs -  Physician Assistants and Nurse Practitioners) who all work together to provide you with the care you need, when you need it.  Your next appointment:   6 month(s)  The format for  your next appointment:   Either In Person or Virtual  Provider:   Buford Dresser, MD    Signed, Buford Dresser, MD PhD 04/19/2019    [place]

## 2019-04-26 DIAGNOSIS — I270 Primary pulmonary hypertension: Secondary | ICD-10-CM | POA: Diagnosis not present

## 2019-04-28 DIAGNOSIS — R0902 Hypoxemia: Secondary | ICD-10-CM | POA: Diagnosis not present

## 2019-04-28 DIAGNOSIS — I50812 Chronic right heart failure: Secondary | ICD-10-CM | POA: Diagnosis not present

## 2019-04-28 DIAGNOSIS — Z6837 Body mass index (BMI) 37.0-37.9, adult: Secondary | ICD-10-CM | POA: Diagnosis not present

## 2019-04-28 DIAGNOSIS — I2782 Chronic pulmonary embolism: Secondary | ICD-10-CM | POA: Diagnosis not present

## 2019-04-28 DIAGNOSIS — I2721 Secondary pulmonary arterial hypertension: Secondary | ICD-10-CM | POA: Diagnosis not present

## 2019-04-29 ENCOUNTER — Encounter: Payer: Self-pay | Admitting: Cardiology

## 2019-05-22 DIAGNOSIS — I272 Pulmonary hypertension, unspecified: Secondary | ICD-10-CM | POA: Diagnosis not present

## 2019-05-22 DIAGNOSIS — I2782 Chronic pulmonary embolism: Secondary | ICD-10-CM | POA: Diagnosis not present

## 2019-05-22 DIAGNOSIS — I50812 Chronic right heart failure: Secondary | ICD-10-CM | POA: Diagnosis not present

## 2019-05-22 DIAGNOSIS — R0902 Hypoxemia: Secondary | ICD-10-CM | POA: Diagnosis not present

## 2019-07-19 ENCOUNTER — Other Ambulatory Visit: Payer: Self-pay | Admitting: Internal Medicine

## 2019-07-19 DIAGNOSIS — M109 Gout, unspecified: Secondary | ICD-10-CM

## 2019-07-19 MED ORDER — ALLOPURINOL 300 MG PO TABS: 300.0000 mg | ORAL_TABLET | Freq: Every day | ORAL | 1 refills | Status: DC

## 2019-08-14 ENCOUNTER — Telehealth: Payer: Self-pay | Admitting: Internal Medicine

## 2019-08-14 MED ORDER — METOPROLOL TARTRATE 25 MG PO TABS: 25.0000 mg | ORAL_TABLET | Freq: Every day | ORAL | 1 refills | Status: DC

## 2019-08-14 NOTE — Telephone Encounter (Signed)
Pt said she needs metoprolol tartrate refilled and sent to [facility]. She said the pharmacy told her that they haven't filled that prescription before. Pt would like a call back once it is sent to pharmacy.

## 2019-09-07 ENCOUNTER — Other Ambulatory Visit (HOSPITAL_COMMUNITY): Payer: Self-pay | Admitting: Pulmonary Disease

## 2019-09-07 ENCOUNTER — Other Ambulatory Visit: Payer: Self-pay | Admitting: Pulmonary Disease

## 2019-09-07 DIAGNOSIS — R0602 Shortness of breath: Secondary | ICD-10-CM

## 2019-09-14 ENCOUNTER — Other Ambulatory Visit (HOSPITAL_COMMUNITY): Payer: Self-pay | Admitting: Pulmonary Disease

## 2019-09-14 ENCOUNTER — Ambulatory Visit (HOSPITAL_COMMUNITY)
Admission: RE | Admit: 2019-09-14 | Discharge: 2019-09-14 | Disposition: A | Discharge status: Home / Self Care | Payer: 59 | Source: Ambulatory Visit | Attending: Pulmonary Disease | Admitting: Pulmonary Disease

## 2019-09-14 ENCOUNTER — Other Ambulatory Visit: Payer: Self-pay

## 2019-09-14 DIAGNOSIS — R0602 Shortness of breath: Secondary | ICD-10-CM

## 2019-09-14 MED ORDER — TECHNETIUM TO 99M ALBUMIN AGGREGATED: 1.6000 | Freq: Once | INTRAVENOUS | Status: DC | PRN

## 2019-09-22 ENCOUNTER — Other Ambulatory Visit (HOSPITAL_COMMUNITY): Payer: Self-pay | Admitting: Pulmonary Disease

## 2019-09-22 DIAGNOSIS — I272 Pulmonary hypertension, unspecified: Secondary | ICD-10-CM

## 2019-10-05 ENCOUNTER — Ambulatory Visit (HOSPITAL_COMMUNITY)
Admission: RE | Admit: 2019-10-05 | Discharge: 2019-10-05 | Disposition: A | Discharge status: Home / Self Care | Payer: 59 | Source: Ambulatory Visit | Attending: Pulmonary Disease | Admitting: Pulmonary Disease

## 2019-10-05 ENCOUNTER — Other Ambulatory Visit: Payer: Self-pay

## 2019-10-05 ENCOUNTER — Ambulatory Visit (INDEPENDENT_AMBULATORY_CARE_PROVIDER_SITE_OTHER): Payer: 59 | Admitting: Internal Medicine

## 2019-10-05 ENCOUNTER — Encounter: Payer: Self-pay | Admitting: Internal Medicine

## 2019-10-05 VITALS — BP 116/66 | HR 78 | Temp 98.3°F | Ht 65.0 in | Wt 223.8 lb

## 2019-10-05 DIAGNOSIS — I10 Essential (primary) hypertension: Secondary | ICD-10-CM | POA: Insufficient documentation

## 2019-10-05 DIAGNOSIS — Z86711 Personal history of pulmonary embolism: Secondary | ICD-10-CM

## 2019-10-05 DIAGNOSIS — I272 Pulmonary hypertension, unspecified: Secondary | ICD-10-CM | POA: Insufficient documentation

## 2019-10-05 DIAGNOSIS — I872 Venous insufficiency (chronic) (peripheral): Secondary | ICD-10-CM

## 2019-10-05 DIAGNOSIS — D696 Thrombocytopenia, unspecified: Secondary | ICD-10-CM

## 2019-10-05 DIAGNOSIS — C50212 Malignant neoplasm of upper-inner quadrant of left female breast: Secondary | ICD-10-CM

## 2019-10-05 DIAGNOSIS — G4730 Sleep apnea, unspecified: Secondary | ICD-10-CM | POA: Diagnosis not present

## 2019-10-05 DIAGNOSIS — Z170 Estrogen receptor positive status [ER+]: Secondary | ICD-10-CM

## 2019-10-05 DIAGNOSIS — C50919 Malignant neoplasm of unspecified site of unspecified female breast: Secondary | ICD-10-CM

## 2019-10-05 DIAGNOSIS — R748 Abnormal levels of other serum enzymes: Secondary | ICD-10-CM

## 2019-10-05 DIAGNOSIS — E119 Type 2 diabetes mellitus without complications: Secondary | ICD-10-CM | POA: Diagnosis not present

## 2019-10-05 DIAGNOSIS — I8290 Acute embolism and thrombosis of unspecified vein: Secondary | ICD-10-CM

## 2019-10-05 DIAGNOSIS — E790 Hyperuricemia without signs of inflammatory arthritis and tophaceous disease: Secondary | ICD-10-CM

## 2019-10-05 DIAGNOSIS — M109 Gout, unspecified: Secondary | ICD-10-CM

## 2019-10-05 DIAGNOSIS — E669 Obesity, unspecified: Secondary | ICD-10-CM

## 2019-10-05 DIAGNOSIS — I2782 Chronic pulmonary embolism: Secondary | ICD-10-CM

## 2019-10-05 LAB — POCT GLYCOSYLATED HEMOGLOBIN (HGB A1C): Hemoglobin A1C: 5 % (ref 4.0–5.6)

## 2019-10-05 MED ORDER — TRIAMCINOLONE ACETONIDE 0.1 % EX CREA: 1.0000 "application " | TOPICAL_CREAM | Freq: Every day | CUTANEOUS | 2 refills | Status: AC | PRN

## 2019-10-05 MED ORDER — METOPROLOL TARTRATE 25 MG PO TABS: 25.0000 mg | ORAL_TABLET | Freq: Two times a day (BID) | ORAL | 3 refills | Status: DC

## 2019-10-05 NOTE — Progress Notes (Signed)
Chief Complaint  Patient presents with  . Follow-up   F/u  1. Pulmonary HTN she is currenly on 4L O2 portable O2 is 94% f/u [facility] pulm Dr. 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 Harrietta Guardian and should be on xarelto 20 mg qhs 4L O2. She is NYHA class 2-3 on remodulin, iv trepostal 40, sildenafil 40 tid, macitetan 10 mg, Torsemide 100 mg qd per pulm [facility] note 09/22/19  She will need chronic anticoagulation  They wanted to repeat TTE, 6 MWT and pro BNP tests but new insurance limits pt and [facility] not in network  Today [name] reports she is off inhalers and no cough after recent infusion Remodulin Labs 7/2 K 3.3 and probnp 1180 she is also back on spironlactone 25 mg qd   2. H/o breast cancer overdue to see Dr. Lindi Adie referred today due for dx mammogram With elevated alkaline phos will ask to do bone scan to further w/u sch 8/421  On Arimidex  3. Gout, recurrent MTP  never saw rheumatology uric acid increased though on allopurinol 300 mg max dose   4. HTN controlled on meds above and metoprolol 25 mg bid   5. 09/29/19 labs plts 108 and she has been having nose bleeds   Review of Systems  Constitutional: Negative for weight loss.  HENT: Negative.  Negative for hearing loss.   Eyes: Negative for blurred vision.  Respiratory: Negative for cough and shortness of breath.   Cardiovascular: Negative for chest pain.  Gastrointestinal: Negative for abdominal pain.  Musculoskeletal: Negative for falls and joint pain.  Skin: Negative for rash.  Neurological: Negative for headaches.  Psychiatric/Behavioral: Negative for depression.   Past Medical History:  Diagnosis Date  . Anemia    iron deficiency  . Arthritis   . Borderline diabetes   . Complication of anesthesia    woke up slowly- after hysterectomy- 2015  . Diabetes mellitus, type II ([place])   . DVT (deep venous thrombosis) ([place]) 2014   left leg  . Family history of  breast cancer   . Gout   . HTN (hypertension)   . 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 Menorrhagia    secondary to uterine fibroids  . OSA (obstructive sleep apnea)    07/25/13 HST AHI 33/hr, severe hypoxemia O2 min 42% and 95% of the time <89%  . PE (pulmonary embolism) 2014   bilateral  . Prediabetes   . Pulmonary artery hypertension ([place])   . Pulmonary nodule    (54m on loeft lower lobe) found on CT scan July 2014, repeat scan Jan 2015 showed less than 434m . Right ovarian cyst    noted 09/2012   . S/P TAH (total abdominal hysterectomy) 06/07/2013  . Trichomoniasis    05/2011    Past Surgical History:  Procedure Laterality Date  . ABDOMINAL HYSTERECTOMY N/A 06/07/2013   Procedure: HYSTERECTOMY ABDOMINAL WITH BIALTERAL SALPINGECTOMY;  Surgeon: VaElveria RoyalsMD;  [facility];  Service: Gynecology;  Laterality: N/A;  . BIOPSY  09/02/2017   Procedure: BIOPSY;  Surgeon: ScWilford CornerMD;  [facility];  Service: Endoscopy;;  . BREAST LUMPECTOMY Left 07/27/2016   BREAST LUMPECTOMY WITH RADIOACTIVE SEED AND SENTINEL LYMPH NODE BIOPSY (Left)  . BREAST LUMPECTOMY WITH RADIOACTIVE SEED AND SENTINEL LYMPH NODE BIOPSY Left 07/27/2016   Procedure: BREAST LUMPECTOMY WITH RADIOACTIVE SEED AND SENTINEL  LYMPH NODE BIOPSY;  Surgeon: Stark Klein, MD;  Location: [facility];  Service: General;  Laterality: Left;  . CARDIAC CATHETERIZATION N/A 12/24/2015   Procedure: Right Heart Cath;  Surgeon: [name], MD;  Location: [facility];  Service: Cardiovascular;  Laterality: N/A;  . CESAREAN SECTION    . COLONOSCOPY    . COLONOSCOPY WITH PROPOFOL N/A 09/02/2017   Procedure: COLONOSCOPY WITH PROPOFOL;  Surgeon: Wilford Corner, MD;  [facility];  Service: Endoscopy;  Laterality: N/A;  . ESOPHAGOGASTRODUODENOSCOPY (EGD) WITH PROPOFOL N/A 09/02/2017   Procedure: ESOPHAGOGASTRODUODENOSCOPY (EGD) WITH PROPOFOL;  Surgeon: Wilford Corner, MD;  [facility];  Service: Endoscopy;  Laterality: N/A;  . IR CV LINE INJECTION  07/27/2017  . POLYPECTOMY  2008   Removal of uterine polyp  . PORT-A-CATH REMOVAL Right 09/09/2017   Procedure: REMOVAL PORT-A-CATH;  Surgeon: Stark Klein, MD;  Location: [facility];  Service: General;  Laterality: Right;  . PORTA CATH INSERTION  07/27/2016  . PORTACATH PLACEMENT Right 07/27/2016   Procedure: INSERTION PORT-A-CATH;  Surgeon: Stark Klein, MD;  Location: [facility];  Service: General;  Laterality: Right;  . SUBMUCOSAL INJECTION  09/02/2017   Procedure: SUBMUCOSAL INJECTION;  Surgeon: Wilford Corner, MD;  [facility];  Service: Endoscopy;;  epi injection   Family History  Problem Relation Age of Onset  . Hypertension Mother   . Kidney disease Mother   . Hypertension Father   . Stroke Sister   . Breast cancer Maternal Grandmother        died at 97  . Breast cancer Paternal Grandmother   . Breast cancer Cousin        pat first cousin dx in her 67s  . Hypertension Brother   . Cancer Maternal Aunt        unknown form  . Breast cancer Paternal Aunt   . Colon cancer Other 96       MGMs brother  . Breast cancer Paternal Aunt   . Cancer Other        breast ca in [place]  . Cancer Other        g uncle colon or stomach ca   Social History   Socioeconomic History  . Marital status: Single    Spouse name: Not on file  . Number of children: 1  . Years of education: Not on file  . Highest education level: Not on file  Occupational History  . Not on file  Tobacco Use  . Smoking status: Former Smoker    Packs/day: 0.33    Years: 10.00    Pack years: 3.30    Types: Cigarettes    Quit date: 10/04/2011    Years since quitting: 8.0  . Smokeless tobacco: Never Used  Vaping Use  . Vaping Use: Never used  Substance and Sexual Activity  . Alcohol use: Yes    Alcohol/week: 0.0 standard drinks    Comment: social  . Drug use: No  . Sexual activity: Yes   Other Topics Concern  . 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 Difficulty of Paying Living Expenses:   Food Insecurity:   . Worried About Estate manager/land agent  of Food in the Last Year:   . [place] in the Last Year:   Transportation Needs:   . Lack of Transportation (Medical):   Marland Kitchen Lack of Transportation (Non-Medical):   Physical Activity:   . Days of Exercise per Week:   . Minutes of Exercise per Session:   Stress:   . Feeling of Stress :   Social Connections:   . Frequency of Communication with Friends and Family:   . Frequency of Social Gatherings with Friends and Family:   . Attends Religious Services:   . Active Member of Clubs or Organizations:   . Attends Archivist Meetings:   Marland Kitchen Marital Status:   Intimate Partner Violence:   . Fear of Current or Ex-Partner:   . Emotionally Abused:   Marland Kitchen Physically Abused:   . Sexually Abused:    Current Meds  Medication Sig  . acetaminophen (TYLENOL) 500 MG tablet Take 1,000 mg by mouth every 6 (six) hours as needed for moderate pain or headache.   . allopurinol (ZYLOPRIM) 300 MG tablet Take 1 tablet (300 mg total) by mouth daily. appt further refills after 6 months call to schedule now please  . azelastine (ASTELIN) 0.1 % nasal spray Place 1 spray into both nostrils 2 (two) times daily. Use in each nostril as directed  . Colchicine (MITIGARE) 0.6 MG CAPS Take 1 capsule by mouth as needed. As needed gout flare max # of pills 2 in 24 hours  . ipratropium-albuterol (DUONEB) 0.5-2.5 (3) MG/3ML SOLN Take 3 mLs by nebulization every 4 (four) hours as needed.  . loratadine (CLARITIN) 10 MG tablet Take 10 mg by mouth daily.  . metoprolol tartrate (LOPRESSOR) 25 MG tablet Take 1 tablet (25 mg  total) by mouth 2 (two) times daily.  . Multiple Vitamin (MULTIVITAMIN) tablet Take 1 tablet by mouth daily.  . OPSUMIT 10 MG TABS Take 10 mg by mouth daily.  Vladimir Faster Glycol-Propyl Glycol (SYSTANE OP) Place 1 drop into both eyes daily as needed (dry eyes).   . rivaroxaban (XARELTO) 20 MG TABS tablet Take 20 mg by mouth daily with supper.  . sildenafil (REVATIO) 20 MG tablet Take 40 mg by mouth 3 (three) times daily.   Marland Kitchen spironolactone (ALDACTONE) 25 MG tablet Take 25 mg by mouth daily.  Marland Kitchen torsemide (DEMADEX) 100 MG tablet Take 100 mg by mouth daily.   Marland Kitchen treprostinil (REMODULIN) 20 MG/20ML injection 20 ng/kg/min by Continuous infusion (non-IV) route continuous.  . triamcinolone cream (KENALOG) 0.1 % Apply 1 application topically daily as needed (leg discoloration).  . [DISCONTINUED] anastrozole (ARIMIDEX) 1 MG tablet Take 1 mg by mouth daily.  . [DISCONTINUED] metoprolol tartrate (LOPRESSOR) 25 MG tablet Take 1 tablet (25 mg total) by mouth daily.  . [DISCONTINUED] triamcinolone cream (KENALOG) 0.1 % Apply 1 application topically daily as needed (leg discoloration).   Allergies  Allergen Reactions  . Ampicillin Other (See Comments)    Severe abdominal pain, dizziness (penicillin is okay)  . Amoxicillin Other (See Comments)    LIGHT-HEADED and CLOSE TO "PASSING OUT"  AND ABDOMINAL PAIN Has patient had a PCN reaction causing immediate rash, facial/tongue/throat swelling, SOB or lightheadedness with hypotension: No Has patient had a PCN reaction causing severe rash involving mucus membranes or skin necrosis: No Has patient had a PCN reaction that required hospitalization No Has patient had a PCN reaction occurring within the last 10 years: No If all of the above answers are "NO", then may proceed with  Cephalosporin use.  . Indomethacin Other (See Comments)    Gastro irritation  . Nsaids     GI ulcers   . Metronidazole Nausea And Vomiting  . Sulfa Antibiotics Rash and Other (See  Comments)    Very bad yeast infection   Recent Results (from the past 2160 hour(s))  POCT glycosylated hemoglobin (Hb A1C)     Status: Normal   Collection Time: 10/05/19  3:39 PM  Result Value Ref Range   Hemoglobin A1C 5.0 4.0 - 5.6 %   HbA1c POC (<> result, manual entry)     HbA1c, POC (prediabetic range)     HbA1c, POC (controlled diabetic range)     Objective  Body mass index is 37.24 kg/m. Wt Readings from Last 3 Encounters:  10/18/19 222 lb (100.7 kg)  10/16/19 225 lb 11.2 oz (102.4 kg)  10/05/19 223 lb 12.8 oz (101.5 kg)   Temp Readings from Last 3 Encounters:  10/16/19 98.3 F (36.8 C) (Temporal)  10/05/19 98.3 F (36.8 C) (Oral)  12/22/18 97.6 F (36.4 C) (Oral)   BP Readings from Last 3 Encounters:  10/18/19 126/76  10/16/19 129/62  10/05/19 116/66   Pulse Readings from Last 3 Encounters:  10/18/19 80  10/16/19 81  10/05/19 78    Physical Exam Vitals and nursing note reviewed.  Constitutional:      Appearance: Normal appearance. She is well-developed and well-groomed. She is obese.  HENT:     Head: Normocephalic and atraumatic.  Eyes:     Conjunctiva/sclera: Conjunctivae normal.     Pupils: Pupils are equal, round, and reactive to light.  Cardiovascular:     Rate and Rhythm: Normal rate and regular rhythm.     Heart sounds: Normal heart sounds. No murmur heard.   Pulmonary:     Effort: Pulmonary effort is normal.     Breath sounds: Normal breath sounds.     Comments: On 4L O2 today Skin:    General: Skin is warm and dry.  Neurological:     General: No focal deficit present.     Mental Status: She is alert and oriented to person, place, and time. Mental status is at baseline.     Gait: Gait normal.  Psychiatric:        Attention and Perception: Attention and perception normal.        Mood and Affect: Mood and affect normal.        Speech: Speech normal.        Behavior: Behavior is cooperative.        Thought Content: Thought content normal.         Cognition and Memory: Cognition and memory normal.        Judgment: Judgment normal.     Assessment  Plan  Essential hypertension - Plan: metoprolol tartrate (LOPRESSOR) 25 MG tablet bid with other meds   Venous stasis dermatitis of both lower extremities - Plan: triamcinolone cream (KENALOG) 0.1 %  Type 2 diabetes mellitus without complication,/prediabtes - Plan: POCT glycosylated hemoglobin (Hb A1C) A1C 5.0 today improved   Malignant neoplasm of female breast left breast ER+, unspecified estrogen receptor status, unspecified laterality, unspecified site of breast ([place]) - Plan: Ambulatory referral to Hematology Dr. Lindi Adie, MM DIAG BREAST TOMO BILATERAL [facility]   Elevated alkaline phosphatase level - Plan: Ambulatory referral to Hematology H/o to order bone scan sch 11/01/19   Thrombocytopenia ([place]) - Plan: Ambulatory referral to Hematology Monitor   Gout, unspecified cause, unspecified chronicity, unspecified site -  Plan: Ambulatory referral to Rheumatology Dr. Amil Amen  Elevated uric acid in blood - Plan: Ambulatory referral to Rheumatology  Pulmonary HTN WHO 4 NHYA class 2/3 on 4L o2 continuous  H/o OSA on cpap VTE (venous thromboembolism) Hx pulmonary embolism now 09/14/19 with chronic PE  Should be on xarelto 20 mg qhs per Dr. Lacinda Axon pulm Referral per Dr. Harrietta Guardian to [facility] as of 08/2019  Obesity (BMI 30-39.9)  rec healthy diet and exercise    HM-CPE at f/u after visit 10/05/19 Fluutdhad work 12/19/2018  MMR immune Hep A vx 1/2 needs updated dose  Hep B new vaccine1/2 will need repeat In future missed 1 month repeat  -with insurance change not sure what her insurance will cover getting new insurance in 2021 vs she can get at her pharmacy  -will need new hep B vaccine at f/u  covid 2/2 pfizer   Tdap utd 06/21/17 Consider shingrix in future and pna 23  Hep C negative11/29/17  Pap smear  -s/p TAH(w/o cervix only ovaries intact)DUB 2/2 fibroids,  adenomyosis  -follows with Dr. M[facility] pap neg 6/2010last saw 01/2017 Dr. Benjie Karvonen  -obtained records 04/14/17 visit  DEXA [facility] 06/28/17 normal ordered again 10/05/19 overdue h/o breast cancer left   [facility] 07/05/2018 abnormal with new right breast cal. H/o left breast cancer -breast bx 09/27/18 right breast negative  -f/u [facility] h/o due in 01/2019 and [place] surgery 03/2019 due to insurance reasons initial care was with h/o (Dr. Renold Genta surgery (Dr. Barry Dienes) in [place] of note   Colonoscopy/EGD had 6/6/19[facility] Dr. Schoolerdiverticulosis/hemorrhooids f/u in 10 years  Of note eye exam had 01/2017 A1C today 5.0   She is former smoker from mid 14s age 54 to 49 3 cig per day  Specialists  pulm [facility]. Harrietta Guardian H/o Dr. Bethanie Dicker GI  Cards leb in [place], Dr. Christopher/ Dr. Haroldine Laws  Surgery Dr. Barry Dienes  Ob/gyn Dr. 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 Lacinda Axon cath lab 06/02/2018 [place], worsening HD status on max oral therapy selexipag, sildenafil, macitentan will rec iv remodulin  Of note selexipa 400 mcg bid 05/2016 to 50/2020 stopped due to change to add oral or iv treprostinil  2L with exertion prn and 6 L qhs on cpap  Time spent 30 minutes with plan of care above  Provider: Dr. Olivia Mackie [facility]

## 2019-10-05 NOTE — Progress Notes (Signed)
Patient flagged: Current status:  PATIENT IS OVERDUE FOR BMI FOLLOW UP PLAN BMI is estimated to be 37.2 based on the last recorded weight and height Current status:  PATIENT NEEDS HgbA1c DONE THIS FISCAL YEAR Last HgbA1c was done on 12/22/2018

## 2019-10-05 NOTE — Patient Instructions (Addendum)
Results for [name], [name] (MRN [id number]) as of 10/05/2019 15:17  Ref. Range 05/24/2017 16:57 08/30/2017 16:48 12/30/2017 16:38 03/21/2018 17:29 10/15/2018 16:48 12/22/2018 14:41  Uric Acid, Serum Latest Ref Range: 2.4 - 7.0 mg/dL 8.9 (H) 10.8 (H) 10.1 (H) 8.7 (H) 10.1 (H) 12.0 (H)   Gout Dr. Amil Amen  Dr. Lindi Adie referral   [place] stands for "Dietary Approaches to Stop Hypertension." The DASH eating plan is a healthy eating plan that has been shown to reduce high blood pressure (hypertension). It may also reduce your risk for type 2 diabetes, heart disease, and stroke. The DASH eating plan may also help with weight loss. What are tips for following this plan?  General guidelines  Avoid eating more than 2,300 mg (milligrams) of salt (sodium) a day. If you have hypertension, you may need to reduce your sodium intake to 1,500 mg a day.  Limit alcohol intake to no more than 1 drink a day for nonpregnant women and 2 drinks a day for men. One drink equals 12 oz of beer, 5 oz of wine, or 1 oz of hard liquor.  Work with your health care provider to maintain a healthy body weight or to lose weight. Ask what an ideal weight is for you.  Get at least 30 minutes of exercise that causes your heart to beat faster (aerobic exercise) most days of the week. Activities may include walking, swimming, or biking.  Work with your health care provider or diet and nutrition specialist (dietitian) to adjust your eating plan to your individual calorie needs. Reading food labels   Check food labels for the amount of sodium per serving. Choose foods with less than 5 percent of the Daily Value of sodium. Generally, foods with less than 300 mg of sodium per serving fit into this eating plan.  To find whole grains, look for the word "whole" as the first word in the ingredient list. Shopping  Buy products labeled as "low-sodium" or "no salt added."  Buy fresh foods. Avoid canned foods and premade or frozen  meals. Cooking  Avoid adding salt when cooking. Use salt-free seasonings or herbs instead of table salt or sea salt. Check with your health care provider or pharmacist before using salt substitutes.  Do not fry foods. Cook foods using healthy methods such as baking, boiling, grilling, and broiling instead.  Cook with heart-healthy oils, such as olive, canola, soybean, or sunflower oil. Meal planning  Eat a balanced diet that includes: ? 5 or more servings of fruits and vegetables each day. At each meal, try to fill half of your plate with fruits and vegetables. ? Up to 6-8 servings of whole grains each day. ? Less than 6 oz of lean meat, poultry, or fish each day. A 3-oz serving of meat is about the same size as a deck of cards. One egg equals 1 oz. ? 2 servings of low-fat dairy each day. ? A serving of nuts, seeds, or beans 5 times each week. ? Heart-healthy fats. Healthy fats called Omega-3 fatty acids are found in foods such as flaxseeds and coldwater fish, like sardines, salmon, and mackerel.  Limit how much you eat of the following: ? Canned or prepackaged foods. ? Food that is high in trans fat, such as fried foods. ? Food that is high in saturated fat, such as fatty meat. ? Sweets, desserts, sugary drinks, and other foods with added sugar. ? Full-fat dairy products.  Do not salt foods before eating.  Try  to eat at least 2 vegetarian meals each week.  Eat more home-cooked food and less restaurant, buffet, and fast food.  When eating at a restaurant, ask that your food be prepared with less salt or no salt, if possible. What foods are recommended? The items listed may not be a complete list. Talk with your dietitian about what dietary choices are best for you. Grains Whole-grain or whole-wheat bread. Whole-grain or whole-wheat pasta. Brown rice. [place]. Bulgur. Whole-grain and low-sodium cereals. Pita bread. Low-fat, low-sodium crackers. Whole-wheat flour  tortillas. Vegetables Fresh or frozen vegetables (raw, steamed, roasted, or grilled). Low-sodium or reduced-sodium tomato and vegetable juice. Low-sodium or reduced-sodium tomato sauce and tomato paste. Low-sodium or reduced-sodium canned vegetables. Fruits All fresh, dried, or frozen fruit. Canned fruit in natural juice (without added sugar). Meat and other protein foods Skinless chicken or [place]. Ground chicken or [place]. Pork with fat trimmed off. Fish and seafood. Egg whites. Dried beans, peas, or lentils. Unsalted nuts, nut butters, and seeds. Unsalted canned beans. Lean cuts of beef with fat trimmed off. Low-sodium, lean deli meat. Dairy Low-fat (1%) or fat-free (skim) milk. Fat-free, low-fat, or reduced-fat cheeses. Nonfat, low-sodium ricotta or cottage cheese. Low-fat or nonfat yogurt. Low-fat, low-sodium cheese. Fats and oils Soft margarine without trans fats. Vegetable oil. Low-fat, reduced-fat, or light mayonnaise and salad dressings (reduced-sodium). Canola, safflower, olive, soybean, and sunflower oils. Avocado. Seasoning and other foods Herbs. Spices. Seasoning mixes without salt. Unsalted popcorn and pretzels. Fat-free sweets. What foods are not recommended? The items listed may not be a complete list. Talk with your dietitian about what dietary choices are best for you. Grains Baked goods made with fat, such as croissants, muffins, or some breads. Dry pasta or rice meal packs. Vegetables Creamed or fried vegetables. Vegetables in a cheese sauce. Regular canned vegetables (not low-sodium or reduced-sodium). Regular canned tomato sauce and paste (not low-sodium or reduced-sodium). Regular tomato and vegetable juice (not low-sodium or reduced-sodium). Angie Fava. Olives. Fruits Canned fruit in a light or heavy syrup. Fried fruit. Fruit in cream or butter sauce. Meat and other protein foods Fatty cuts of meat. Ribs. Fried meat. Berniece Salines. Sausage. Bologna and other processed lunch meats.  Salami. Fatback. Hotdogs. Bratwurst. Salted nuts and seeds. Canned beans with added salt. Canned or smoked fish. Whole eggs or egg yolks. Chicken or [place] with skin. Dairy Whole or 2% milk, cream, and half-and-half. Whole or full-fat cream cheese. Whole-fat or sweetened yogurt. Full-fat cheese. Nondairy creamers. Whipped toppings. Processed cheese and cheese spreads. Fats and oils Butter. Stick margarine. Lard. Shortening. Ghee. Bacon fat. Tropical oils, such as coconut, palm kernel, or palm oil. Seasoning and other foods Salted popcorn and pretzels. Onion salt, garlic salt, seasoned salt, table salt, and sea salt. Worcestershire sauce. Tartar sauce. Barbecue sauce. Teriyaki sauce. Soy sauce, including reduced-sodium. Steak sauce. Canned and packaged gravies. Fish sauce. Oyster sauce. Cocktail sauce. Horseradish that you find on the shelf. Ketchup. Mustard. Meat flavorings and tenderizers. Bouillon cubes. Hot sauce and Tabasco sauce. Premade or packaged marinades. Premade or packaged taco seasonings. Relishes. Regular salad dressings. Where to find more information:  National Heart, Lung, and [place]: https://wilson-eaton.com/  American Heart Association: www.heart.org Summary  The DASH eating plan is a healthy eating plan that has been shown to reduce high blood pressure (hypertension). It may also reduce your risk for type 2 diabetes, heart disease, and stroke.  With the DASH eating plan, you should limit salt (sodium) intake to 2,300 mg a day. If you  have hypertension, you may need to reduce your sodium intake to 1,500 mg a day.  When on the DASH eating plan, aim to eat more fresh fruits and vegetables, whole grains, lean proteins, low-fat dairy, and heart-healthy fats.  Work with your health care provider or diet and nutrition specialist (dietitian) to adjust your eating plan to your individual calorie needs. This information is not intended to replace advice given to you by your health  care provider. Make sure you discuss any questions you have with your health care provider. Document Revised: 02/26/2017 Document Reviewed: 03/09/2016 Elsevier Patient Education  [place].  Thrombocytopenia Thrombocytopenia is a condition in which you have a low number of platelets in your blood. Platelets are also called thrombocytes. Platelets are tiny cells in the blood. When you bleed, they clump together at the cut or injury to stop the bleeding. This is called blood clotting. Not having enough platelets can cause bleeding problems. Some cases of thrombocytopenia are mild while others are more severe. What are the causes? This condition may be caused by:  Decreased production of platelets. This may be caused by: ? Aplastic anemia. This is when your bone marrow stops making blood cells. ? Cancer in the bone marrow. ? Certain medicines, including chemotherapy. ? Infection in the bone marrow. ? Drinking a lot of alcohol.  Increased destruction of platelets. This may be caused by: ? Certain immune diseases. ? Certain medicines. ? Certain blood clotting disorders. ? Certain inherited disorders. ? Certain bleeding disorders. ? Pregnancy. ? Having an enlarged spleen (hypersplenism). In hypersplenism, the spleen gathers up platelets from circulation. This means that the platelets are not available to help with blood clotting. The spleen can be enlarged because of cirrhosis or other conditions. What are the signs or symptoms? Symptoms of this condition are the result of poor blood clotting. They will vary depending on how low the platelet counts are. Symptoms may include:  Abnormal bleeding.  Nosebleeds.  Heavy menstrual periods.  Blood in the urine or stool (feces).  A purplish discoloration in the skin (purpura).  Bruising.  A rash that looks like pinpoint, purplish-red spots (petechiae) on the skin and mucous membranes. How is this diagnosed?  This condition may be  diagnosed with blood tests and a physical exam. Sometimes, a sample of bone marrow may be removed to look for the original cells (megakaryocytes) that make platelets. Other tests may be needed depending on the cause. How is this treated? Treatment for this condition depends on the cause. Treatment options may include:  Treatment of another condition that is causing the low platelet count.  Medicines to help protect your platelets from being destroyed.  A replacement (transfusion) of platelets to stop or prevent bleeding.  Surgery to remove the spleen. Follow these instructions at home: Activity  Avoid activities that could cause injury or bruising, and follow instructions about how to prevent falls.  Take extra care not to cut yourself when you shave or when you use scissors, needles, knives, and other tools.  Take extra care to protect yourself from burns when ironing or cooking. General instructions   Check your skin and the inside of your mouth for bruising or bleeding as told by your health care provider.  Check your spit (sputum), urine, and stool for blood as told by your health care provider.  Do not drink alcohol.  Take over-the-counter and prescription medicines only as told by your health care provider.  Do not take any medicines that  have aspirin or NSAIDs in them. These medicines can thin your blood and cause you to bleed more easily.  Tell all your health care providers, including dentists and eye doctors, about your condition. Contact a health care provider if you have:  Unexplained bruising. Get help right away if you have:  Active bleeding from anywhere on your body.  Blood in your sputum, urine, or stool. Summary  Thrombocytopenia is a condition in which you have a low number of platelets in your blood.  Platelets are needed for blood clotting.  Symptoms of this condition are the result of poor blood clotting and may include abnormal bleeding,  nosebleeds, and bruising.  This condition may be diagnosed with blood tests and a physical exam.  Treatment for this condition depends on the cause. This information is not intended to replace advice given to you by your health care provider. Make sure you discuss any questions you have with your health care provider. Document Revised: 12/16/2017 Document Reviewed: 12/16/2017 Elsevier Patient Education  [place].

## 2019-10-05 NOTE — Progress Notes (Signed)
  Echocardiogram 2D Echocardiogram has been performed.  [name] 10/05/2019, 10:01 AM

## 2019-10-06 ENCOUNTER — Telehealth: Payer: Self-pay | Admitting: Hematology and Oncology

## 2019-10-06 ENCOUNTER — Telehealth: Payer: Self-pay | Admitting: Internal Medicine

## 2019-10-06 NOTE — Telephone Encounter (Signed)
Scheduled appt per 7/9 schmsg - unable to reach pt - left message with appt date and time

## 2019-10-06 NOTE — Telephone Encounter (Signed)
Faxed study results to [facility] Pulmon[facility]/Dr.Levarge on 10-06-19

## 2019-10-10 ENCOUNTER — Telehealth: Payer: 59 | Admitting: Cardiology

## 2019-10-11 ENCOUNTER — Telehealth: Payer: Self-pay | Admitting: Internal Medicine

## 2019-10-11 NOTE — Telephone Encounter (Signed)
-----  Message from [name], MD sent at 10/09/2019  9:10 AM EDT ----- A1C 5.0 no longer prediabetes/diabetes range  I am proud of her keep it going

## 2019-10-11 NOTE — Telephone Encounter (Signed)
Left message to return call.

## 2019-10-15 NOTE — Progress Notes (Addendum)
Patient Care Team: McLean-Scocuzza, Nino Glow, MD as PCP - General (Internal Medicine) Vonna Drafts, FNP as Nurse Practitioner (Nurse Practitioner) Stark Klein, MD as Consulting Physician (General Surgery) Nicholas Lose, MD as Consulting Physician (Hematology and Oncology) Gery Pray, MD as Consulting Physician (Radiation Oncology) Gardenia Phlegm, NP as Nurse Practitioner (Hematology and Oncology)  DIAGNOSIS:    ICD-10-CM   1. Malignant neoplasm of upper-inner quadrant of left breast in female, estrogen receptor positive ([place])  C50.212    Z17.0     SUMMARY OF ONCOLOGIC HISTORY: Oncology History  Malignant neoplasm of upper-inner quadrant of left breast in female, estrogen receptor positive ([place])  03/04/2016 Initial Diagnosis   Screening detected left breast density posterior medial 1.4 cm by ultrasound axilla negative, grade 3 IDC ER 60%, PR 0%, Ki-67 30%, HER-2 positive ratio 6.04, copy #16; T1 cN0 stage IA clinical stage   03/21/2016 Genetic Testing   NEgative genetic testing on the comprehensive cancer panel and Negative genetic testing for the MSH2 inversion analysis ([place] inversion). The Comprehensive Cancer Panel offered by [facility] includes sequencing and/or deletion duplication testing of the following 32 genes: APC, ATM, AXIN2, BARD1, BMPR1A, BRCA1, BRCA2, BRIP1, CDH1, CDK4, CDKN2A, CHEK2, EPCAM, FANCC, MLH1, MSH2, MSH6, MUTYH, NBN, PALB2, PMS2, POLD1, POLE, PTEN, RAD51C, RAD51D, SCG5/GREM1, SMAD4, STK11, TP53, VHL, and XRCC2.   The report date is March 21, 2016.   07/27/2016 Surgery   Left lumpectomy: IDC grade 2, 2.6 cm, DCIS intermediate grade,0/3 lymph nodes negative, ER 60%, PR 0%, Ki-67 30%, HER-2 positive ratio 6.04, T2 N0 stage II a   09/01/2016 - 11/03/2016 Chemotherapy   Abraxane Herceptin weekly 12 followed by Herceptin maintenance every 3 weeks     11/19/2016 - 01/20/2017 Radiation Therapy   Adjuvant radiation therapy   01/29/2017 -   Anti-estrogen oral therapy   Anastrozole 2.5 mg daily     CHIEF COMPLIANT: Follow-up of left breast cancer on anastrozole   INTERVAL HISTORY: [name] is a 54 y.o. with above-mentioned history of left breast cancer treated with lumpectomy, adjuvant chemotherapy, and radiation who is currently on anastrozole therapy. Mammogram on 07/05/18 showed right breast calcifications, 0.8cm. Biopsy on 09/27/18 showed microcalcifications and stromal fibrosis, negative for malignancy. Due to change in insurance coverage where [facility] was not covered, she has been followed at [facility] by Dr. Merrie Roof. I last saw her 2 years ago. She presents to the clinic today for follow-up.  She goes to [facility] for pulmonary care for her pulmonary hypertension.  She is using oxygen 24 x 7. She is reestablishing her care back with [place] since her insurance has changed.  ALLERGIES:  is allergic to ampicillin, amoxicillin, indomethacin, nsaids, metronidazole, and sulfa antibiotics.  MEDICATIONS:  Current Outpatient Medications  Medication Sig Dispense Refill  . acetaminophen (TYLENOL) 500 MG tablet Take 1,000 mg by mouth every 6 (six) hours as needed for moderate pain or headache.     . albuterol (PROVENTIL HFA;VENTOLIN HFA) 108 (90 Base) MCG/ACT inhaler Inhale 2 puffs into the lungs every 6 (six) hours as needed for wheezing or shortness of breath. (Patient not taking: Reported on 10/05/2019) 1 Inhaler 12  . allopurinol (ZYLOPRIM) 300 MG tablet Take 1 tablet (300 mg total) by mouth daily. appt further refills after 6 months call to schedule now please 90 tablet 1  . anastrozole (ARIMIDEX) 1 MG tablet Take 1 mg by mouth daily.  1  . azelastine (ASTELIN) 0.1 % nasal spray Place 1 spray into both nostrils 2 (  two) times daily. Use in each nostril as directed 30 mL 12  . Colchicine (MITIGARE) 0.6 MG CAPS Take 1 capsule by mouth as needed. As needed gout flare max # of pills 2 in 24 hours 30 capsule 11  . fluticasone (FLONASE) 50 MCG/ACT  nasal spray Place 2 sprays into both nostrils daily.  (Patient not taking: Reported on 10/05/2019)    . Fluticasone-Salmeterol (ADVAIR DISKUS) 250-50 MCG/DOSE AEPB Inhale 1 puff into the lungs 2 (two) times daily. Rinse mouth (Patient not taking: Reported on 10/05/2019) 60 each 12  . ipratropium-albuterol (DUONEB) 0.5-2.5 (3) MG/3ML SOLN Take 3 mLs by nebulization every 4 (four) hours as needed. 360 mL 12  . loratadine (CLARITIN) 10 MG tablet Take 10 mg by mouth daily.    . metoprolol tartrate (LOPRESSOR) 25 MG tablet Take 1 tablet (25 mg total) by mouth 2 (two) times daily. 180 tablet 3  . Multiple Vitamin (MULTIVITAMIN) tablet Take 1 tablet by mouth daily.    . OPSUMIT 10 MG TABS Take 10 mg by mouth daily.  8  . Polyethyl Glycol-Propyl Glycol (SYSTANE OP) Place 1 drop into both eyes daily as needed (dry eyes).     . sildenafil (REVATIO) 20 MG tablet Take 40 mg by mouth 3 (three) times daily.     Marland Kitchen spironolactone (ALDACTONE) 25 MG tablet Take 25 mg by mouth daily.    Marland Kitchen torsemide (DEMADEX) 20 MG tablet Take 60 mg by mouth daily as needed.     Marland Kitchen treprostinil (REMODULIN) 20 MG/20ML injection 20 ng/kg/min by Continuous infusion (non-IV) route continuous.    . triamcinolone cream (KENALOG) 0.1 % Apply 1 application topically daily as needed (leg discoloration). 454 g 2   No current facility-administered medications for this visit.   Facility-Administered Medications Ordered in Other Visits  Medication Dose Route Frequency Provider Last Rate Last Admin  . heparin lock flush 100 unit/mL  500 Units Intracatheter Once PRN Nicholas Lose, MD      . sodium chloride flush (NS) 0.9 % injection 10 mL  10 mL Intracatheter PRN Nicholas Lose, MD        PHYSICAL EXAMINATION: ECOG PERFORMANCE STATUS: 1 - Symptomatic but completely ambulatory  Vitals:   10/16/19 0941  BP: 129/62  Pulse: 81  Resp: 18  Temp: 98.3 F (36.8 C)  SpO2: 94%   Filed Weights   10/16/19 0941  Weight: 225 lb 11.2 oz (102.4 kg)     BREAST: No palpable masses or nodules in either right or left breasts. No palpable axillary supraclavicular or infraclavicular adenopathy no breast tenderness or nipple discharge. (exam performed in the presence of a chaperone)  LABORATORY DATA:  I have reviewed the data as listed CMP Latest Ref Rng & Units 01/11/2019 01/04/2019 12/22/2018  Glucose 70 - 99 mg/dL 134(H) 122(H) 91  BUN 6 - 23 mg/dL 27(H) 23 23  Creatinine 0.40 - 1.20 mg/dL 1.42(H) 1.41(H) 1.15  Sodium 135 - 145 mEq/L 137 143 141  Potassium 3.5 - 5.1 mEq/L 3.8 4.3 4.1  Chloride 96 - 112 mEq/L 95(L) 94(L) 97  CO2 19 - 32 mEq/L 32 29 33(H)  Calcium 8.4 - 10.5 mg/dL 9.2 9.2 9.4  Total Protein 6.0 - 8.3 g/dL - - 6.8  Total Bilirubin 0.2 - 1.2 mg/dL - - 2.9(H)  Alkaline Phos 39 - 117 U/L - - 212(H)  AST 0 - 37 U/L - - 25  ALT 0 - 35 U/L - - 15    Lab Results  Component  Value Date   WBC 4.0 12/22/2018   HGB 13.4 12/22/2018   HCT 40.5 12/22/2018   MCV 106.5 (H) 12/22/2018   PLT 147.0 (L) 12/22/2018   NEUTROABS 2.8 12/22/2018    ASSESSMENT & PLAN:  Malignant neoplasm of upper-inner quadrant of left breast in female, estrogen receptor positive ([place]) Left lumpectomy 07/28/2016: IDC grade 2, 2.6 cm, DCIS intermediate grade,0/3 lymph nodes negative, ER 60%, PR 0%, Ki-67 30%, HER-2 positive ratio 6.04, T2 N0 stage II a  Treatment summary: 1. Adjuvant chemotherapy with Abraxane and Herceptin (cannot take Taxol because she could not receive steroids)weekly 12 followed by every 3 week Herceptin for one yearuntil May 2019 2. followed by adjuvant radiation08/29/2018-01/05/2017 3. Followed by adjuvant antiestrogen therapystarted 01/28/2017 ------------------------------------------------------------------------------------------------------------------------------- Treatment plan: Adjuvant antiestrogen therapy withanastrozole 1 mg daily 01/28/2017 Adjuvant Herceptin completed May 2019  Anastrozole toxicities:Denies  any hot flashes arthralgias or myalgias. Pulmonary hypertension: This is probably her biggest problem currently. Recurrent gout  Surveillance: 1. Breast exam 10/16/2019: Benign 2.mammograms will need to be scheduled at [facility].  I sent orders.  Pulmonary hypertension: [facility] pulmonologist [name]  Return to clinic in 1 year for follow-up    No orders of the defined types were placed in this encounter.  The patient has a good understanding of the overall plan. she agrees with it. she will call with any problems that may develop before the next visit here.  Total time spent: 30 mins including face to face time and time spent for planning, charting and coordination of care  Nicholas Lose, MD 10/16/2019  I, Cloyde Reams Dorshimer, am acting as scribe for Dr. Nicholas Lose.  I have reviewed the above documentation for accuracy and completeness, and I agree with the above.   Addendum: Patient had elevated alkaline phosphatase and GGT.  Her primary care physician and I discussed about getting a bone scan. I will order the bone scan I will call her with the result.

## 2019-10-16 ENCOUNTER — Other Ambulatory Visit: Payer: Self-pay

## 2019-10-16 ENCOUNTER — Inpatient Hospital Stay: Payer: 59 | Attending: Hematology and Oncology | Admitting: Hematology and Oncology

## 2019-10-16 DIAGNOSIS — Z79811 Long term (current) use of aromatase inhibitors: Secondary | ICD-10-CM | POA: Insufficient documentation

## 2019-10-16 DIAGNOSIS — C50212 Malignant neoplasm of upper-inner quadrant of left female breast: Secondary | ICD-10-CM | POA: Insufficient documentation

## 2019-10-16 DIAGNOSIS — Z79899 Other long term (current) drug therapy: Secondary | ICD-10-CM | POA: Diagnosis not present

## 2019-10-16 DIAGNOSIS — Z7951 Long term (current) use of inhaled steroids: Secondary | ICD-10-CM | POA: Insufficient documentation

## 2019-10-16 DIAGNOSIS — Z170 Estrogen receptor positive status [ER+]: Secondary | ICD-10-CM | POA: Diagnosis not present

## 2019-10-16 MED ORDER — ANASTROZOLE 1 MG PO TABS: 1.0000 mg | ORAL_TABLET | Freq: Every day | ORAL | 3 refills | Status: DC

## 2019-10-16 NOTE — Addendum Note (Signed)
Addended by: Nicholas Lose on: 10/16/2019 04:49 PM   Modules accepted: Orders

## 2019-10-16 NOTE — Assessment & Plan Note (Signed)
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 Adjuvant chemotherapy with Abraxane and Herceptin (cannot take Taxol because she could not receive steroids)weekly 12 followed by every 3 week Herceptin for one yearuntil May 2019 2. followed by adjuvant radiation08/29/2018-01/05/2017 3. Followed by adjuvant antiestrogen therapystarted 01/28/2017 ------------------------------------------------------------------------------------------------------------------------------- Treatment plan: Adjuvant antiestrogen therapy withanastrozole 1 mg daily 01/28/2017 Adjuvant Herceptin to be completed May 2019  Anastrozole toxicities:Denies any hot flashes arthralgias or myalgias.  Surveillance: 1. Breast exam 10/16/2019: Benign 2.mammograms at [facility]  Pulmonary hypertension: Seeing pulmonologist at [facility]  Return to clinic in 6 months for follow-up

## 2019-10-17 ENCOUNTER — Telehealth: Payer: Self-pay | Admitting: Hematology and Oncology

## 2019-10-17 NOTE — Telephone Encounter (Signed)
Scheduled per 7/19 los. Called and spoke with pt, confirmed 7/19 appt

## 2019-10-18 ENCOUNTER — Encounter: Payer: Self-pay | Admitting: Cardiology

## 2019-10-18 ENCOUNTER — Telehealth (INDEPENDENT_AMBULATORY_CARE_PROVIDER_SITE_OTHER): Payer: 59 | Admitting: Cardiology

## 2019-10-18 VITALS — BP 126/76 | HR 80 | Ht 65.0 in | Wt 222.0 lb

## 2019-10-18 DIAGNOSIS — R600 Localized edema: Secondary | ICD-10-CM

## 2019-10-18 DIAGNOSIS — J9611 Chronic respiratory failure with hypoxia: Secondary | ICD-10-CM

## 2019-10-18 DIAGNOSIS — R0600 Dyspnea, unspecified: Secondary | ICD-10-CM

## 2019-10-18 DIAGNOSIS — I2721 Secondary pulmonary arterial hypertension: Secondary | ICD-10-CM | POA: Diagnosis not present

## 2019-10-18 DIAGNOSIS — R0609 Other forms of dyspnea: Secondary | ICD-10-CM

## 2019-10-18 NOTE — Progress Notes (Signed)
Virtual Visit via Telephone Note   This visit type was conducted due to national recommendations for restrictions regarding the COVID-19 Pandemic (e.g. social distancing) in an effort to limit this patient's exposure and mitigate transmission in our community.  Due to her co-morbid illnesses, this patient is at least at moderate risk for complications without adequate follow up.  This format is felt to be most appropriate for this patient at this time.  The patient did not have access to video technology/had technical difficulties with video requiring transitioning to audio format only (telephone).  All issues noted in this document were discussed and addressed.  No physical exam could be performed with this format.  Please refer to the patient's chart for her  consent to telehealth for [facility].   The patient was identified using 2 identifiers.  Patient Location: Home Provider Location: Home Office  Date:  10/18/2019   ID:  Mystery Schrupp, DOB 13-Dec-1965, MRN [id number]  PCP:  McLean-Scocuzza, Nino Glow, MD  Cardiologist:  Dr. Harrietta Guardian, [facility] pulmonology follows [facility]  Referring MD: McLean-Scocuzza, Olivia Mackie *   CC: follow up  History of Present Illness:    Donicia Druck is a 54 y.o. female with a hx of [place] (see RHC below), PE/DVT 2014, OSA, hypertension, type II diabetes, morbid obesity, breast cancer who is seen for follow up today. She was initially seen 04/19/19 as a new consult at the request of McLean-Scocuzza, Olivia Mackie * for the evaluation and management of pulmonary hypertension.  Cardiac history: pulmonary hypertension followed by [facility] pulmonology, Dr. Harrietta Guardian. Plans to continue being seen there, but wished to establish with [facility] since she lives in [place], in case of admission. She has OSA, chronic respiratory failure with hypoxia on home O2, chronic cough followed by pulmonology. At a visit 11/2018 she was noted to have increased weight gain ~30 lbs and LE edema. She was on  spironolactone and torsemide for this.   [place] 06/02/18 (see below),  diagnosed with WHO stage 1 PH (PAH) and was being treated with macitentan, sildenafil, and selexipag. There is detailed documentation re: transition to parenteral therapy for her PAH. Recommendation was to start remodulin.   Today: Reviewed last note from Dr. Harrietta Guardian 09/22/19. She had a gout flare after this visit but otherwise has been doing well. Has upcoming referral to rheumatology to discuss management options.  Having good days and bad days with her breathing, worse with heat/humidity. No syncope. Had one episode of lightheadedness, vitals were good when she checked, O2 good, unclear trigger but has only happened once. She is now getting medications from a different company, wondered if that was involved. Hasn't happened since.  Has been having issues with increased swelling/weight gain. Up 4 lbs. Feels more swollen in her abdomen. She has been watching diet carefully, thinks she has actually lost weight and gained fluid. Starting to move from abdomen into extremities.   Doing well on higher dose treprostinil, no headaches, no severe flushing. No issues with the line or pump. Cleared a bubble from the line the other day, but otherwise no issues.  Has missed several doses of rivaroxaban, ran out for nearly two weeks. When restarted, started having mild nosebleeds again. Wonders if she should have restarted at a lower dose. Has been back on her dose >1 week, though does note she has taken intermittently. Discussed risk vs. Benefit, she will restart.  Had a short time off opsumit, but now has new supply. Doing well with trilogy machine, just had checked  yesterday.  Denies chest pain, shortness of breath at rest or with normal exertion. No PND, orthopnea, LE edema or unexpected weight gain. No syncope. Rare palpitations, typically very brief, usually with activity. Longest episode about 20 minutes.  Past Medical History:   Diagnosis Date  . Anemia    iron deficiency  . Arthritis   . Borderline diabetes   . Complication of anesthesia    woke up slowly- after hysterectomy- 2015  . Diabetes mellitus, type II ([place])   . DVT (deep venous thrombosis) ([place]) 2014   left leg  . Family history of breast cancer   . Gout   . HTN (hypertension)   . 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 Menorrhagia    secondary to uterine fibroids  . OSA (obstructive sleep apnea)    07/25/13 HST AHI 33/hr, severe hypoxemia O2 min 42% and 95% of the time <89%  . PE (pulmonary embolism) 2014   bilateral  . Pulmonary artery hypertension ([place])   . Pulmonary nodule    (64m on loeft lower lobe) found on CT scan July 2014, repeat scan Jan 2015 showed less than 477m . Right ovarian cyst    noted 09/2012   . S/P TAH (total abdominal hysterectomy) 06/07/2013  . Trichomoniasis    05/2011     Past Surgical History:  Procedure Laterality Date  . ABDOMINAL HYSTERECTOMY N/A 06/07/2013   Procedure: HYSTERECTOMY ABDOMINAL WITH BIALTERAL SALPINGECTOMY;  Surgeon: VaElveria RoyalsMD;  [facility];  Service: Gynecology;  Laterality: N/A;  . BIOPSY  09/02/2017   Procedure: BIOPSY;  Surgeon: ScWilford CornerMD;  [facility];  Service: Endoscopy;;  . BREAST LUMPECTOMY Left 07/27/2016   BREAST LUMPECTOMY WITH RADIOACTIVE SEED AND SENTINEL LYMPH NODE BIOPSY (Left)  . BREAST LUMPECTOMY WITH RADIOACTIVE SEED AND SENTINEL LYMPH NODE BIOPSY Left 07/27/2016   Procedure: BREAST LUMPECTOMY WITH RADIOACTIVE SEED AND SENTINEL LYMPH NODE BIOPSY;  Surgeon: FaStark KleinMD;  [place] [facility] Service: General;  Laterality: Left;  . CARDIAC CATHETERIZATION N/A 12/24/2015   Procedure: Right Heart Cath;  Surgeon: JaAdrian ProwsMD;  [facility];  Service: Cardiovascular;  Laterality: N/A;  . CESAREAN SECTION    . COLONOSCOPY    . COLONOSCOPY WITH PROPOFOL N/A 09/02/2017   Procedure:  COLONOSCOPY WITH PROPOFOL;  Surgeon: ScWilford CornerMD;  [facility];  Service: Endoscopy;  Laterality: N/A;  . ESOPHAGOGASTRODUODENOSCOPY (EGD) WITH PROPOFOL N/A 09/02/2017   Procedure: ESOPHAGOGASTRODUODENOSCOPY (EGD) WITH PROPOFOL;  Surgeon: ScWilford CornerMD;  [facility];  Service: Endoscopy;  Laterality: N/A;  . IR CV LINE INJECTION  07/27/2017  . POLYPECTOMY  2008   Removal of uterine polyp  . PORT-A-CATH REMOVAL Right 09/09/2017   Procedure: REMOVAL PORT-A-CATH;  Surgeon: ByStark KleinMD;  [place] [facility] Service: General;  Laterality: Right;  . PORTA CATH INSERTION  07/27/2016  . PORTACATH PLACEMENT Right 07/27/2016   Procedure: INSERTION PORT-A-CATH;  Surgeon: FaStark KleinMD;  [place] [facility] Service: General;  Laterality: Right;  . SUBMUCOSAL INJECTION  09/02/2017   Procedure: SUBMUCOSAL INJECTION;  Surgeon: ScWilford CornerMD;  [facility];  Service: Endoscopy;;  epi injection    Current Medications: Current Outpatient Medications on File Prior to Visit  Medication Sig  . acetaminophen (TYLENOL) 500 MG tablet Take 1,000 mg by mouth every 6 (six) hours as needed for moderate pain or headache.   . albuterol (  VENTOLIN HFA) 108 (90 Base) MCG/ACT inhaler Inhale into the lungs every 6 (six) hours as needed for wheezing or shortness of breath.  . allopurinol (ZYLOPRIM) 300 MG tablet Take 1 tablet (300 mg total) by mouth daily. appt further refills after 6 months call to schedule now please  . anastrozole (ARIMIDEX) 1 MG tablet Take 1 tablet (1 mg total) by mouth daily.  Marland Kitchen azelastine (ASTELIN) 0.1 % nasal spray Place 1 spray into both nostrils 2 (two) times daily. Use in each nostril as directed  . Colchicine (MITIGARE) 0.6 MG CAPS Take 1 capsule by mouth as needed. As needed gout flare max # of pills 2 in 24 hours  . ipratropium-albuterol (DUONEB) 0.5-2.5 (3) MG/3ML SOLN Take 3 mLs by nebulization every 4 (four) hours as  needed.  . loratadine (CLARITIN) 10 MG tablet Take 10 mg by mouth daily.  . metoprolol tartrate (LOPRESSOR) 25 MG tablet Take 1 tablet (25 mg total) by mouth 2 (two) times daily.  . Multiple Vitamin (MULTIVITAMIN) tablet Take 1 tablet by mouth daily.  . OPSUMIT 10 MG TABS Take 10 mg by mouth daily.  Vladimir Faster Glycol-Propyl Glycol (SYSTANE OP) Place 1 drop into both eyes daily as needed (dry eyes).   . sildenafil (REVATIO) 20 MG tablet Take 40 mg by mouth 3 (three) times daily.   Marland Kitchen spironolactone (ALDACTONE) 25 MG tablet Take 25 mg by mouth daily.  Marland Kitchen torsemide (DEMADEX) 20 MG tablet Take 60 mg by mouth daily as needed.   Marland Kitchen treprostinil (REMODULIN) 20 MG/20ML injection 20 ng/kg/min by Continuous infusion (non-IV) route continuous.  . triamcinolone cream (KENALOG) 0.1 % Apply 1 application topically daily as needed (leg discoloration).  . [DISCONTINUED] prochlorperazine (COMPAZINE) 10 MG tablet Take 1 tablet (10 mg total) by mouth every 6 (six) hours as needed (Nausea or vomiting).   Current Facility-Administered Medications on File Prior to Visit  Medication  . heparin lock flush 100 unit/mL  . sodium chloride flush (NS) 0.9 % injection 10 mL     Allergies:   Ampicillin, Amoxicillin, Indomethacin, Nsaids, Metronidazole, and Sulfa antibiotics   Social History   Tobacco Use  . Smoking status: Former Smoker    Packs/day: 0.33    Years: 10.00    Pack years: 3.30    Types: Cigarettes    Quit date: 10/04/2011    Years since quitting: 8.0  . Smokeless tobacco: Never Used  Vaping Use  . Vaping Use: Never used  Substance Use Topics  . Alcohol use: Yes    Alcohol/week: 0.0 standard drinks    Comment: social  . Drug use: No    Family History: family history includes Breast cancer in her cousin, maternal grandmother, paternal aunt, paternal aunt, and paternal grandmother; Cancer in her maternal aunt and other family members; Colon cancer (age of onset: 57) in an other family member;  Hypertension in her brother, father, and mother; Kidney disease in her mother; Stroke in her sister.  ROS:   Please see the history of present illness.  Additional pertinent ROS otherwise unremarkable.  EKGs/Labs/Other Studies Reviewed:    The following studies were reviewed today: RHC 06/26/18: Hemodynamics: Heart rate: 98  Systemic BP: 177/107  Mean arterial pressure: 130  RA a wave: 14 RA v wave: 12 RA mean: 11  RV (s/d/ed): 76/6/14  PA: 80/34  PA mean: 53  PCW a wave: 10 PCW v wave: 11 PCW mean: 8  PA sat: 61%  Arterial sat: 94%  Hemoglobin: 13.8 Body surface  area: 2.65m2  Cardiac Output 4.48 L/min (Fick) Cardiac Index 2.15 L/min/m2 (Fick)  Cardiac Output 4.11 L/min (Thermodilution) Cardiac Index 1.97 L/min/m2 (Thermodilution)  PVR: 10 Wood Units/ 803 dyn-sec/cm5 (Fick) SVR: 2130 dyn-sec/cm5 (Fick)   PVR: 10.9 Wood Units/ 876 dyn-sec/cm5 (Thermodilution) SVR: 2320 dyn-sec/cm5 (Thermodilution)    Notes on prior workup from Care Everywhere: [place]/2017 (mPAP 51, PCW 11, PVR 11.6 WU). Previous RHC at OSH 11/2015 (mPAP 55, PCWP 18-20, PVR 6, Ao sat 79%).   * Sildenafil 40 mg TID, since 06/2017 * Selexipag 4051m BID, since 05/2016  * Macitentan 10 mg daily, since 03/2016 - Sildenafil 20 mg TID, 02/2016 to 06/2017, dose increased Reported prior PA angiogram not consistent with CTEPH  Cardiac testing summary:  TTE 05/02/18 (reviewed): LA 37, EF 55% RV dil/HK, tr TR, can't est PASP IVC dil, poor insp collapse TTE 05/2017 ([facility]): LVEF 60-65% RVH, RV sev dil, mod HK, tr TR, can't est PASP No effusion. IVC dil/poor insp collapse TTE 12/2016 (cone): LA 35, EF 55-60%, LVH (11-12) RV dil/HK, TAPSE 1.6, est PASP "within normal range" No effusion. IVC normal size TTE 07/2016 (cone): LA 36, EF 55-60% RV normal, est PASP "within normal range" No effusion. IVC dil, normal resp variation RHC 06/2016: RA 5, PA 56/22 (36), PCW 9 CO/CI 5.6/2.7 (td), PA sat 67%, PVR 4.8 WU,  PAC 2.3 TTE 03/2016: LA 42, EF 60-65% RV dil/HK, TAPSE 18-19, can't est PASP No effusion. Dilated IVC with poor insp collapse. No IV access for bubble RHC/PA gram 02/2016: RA 15, RV 80/11 (17), PA 80/40 (51), PCW 11 RA with little resp variation PCW with large resp variation, end exp 15-16 mean resp cycle (more accurate with obesity) 11 CO/CI 4.3/2.0 (td*), 5.4/2.6 (f), PA 59%, Ao 86% (higher thruout case) PVR 11.6 WU (td) PA gram with minimal luminal irregularities (?RUL decr perfusion) overall not concerning for significant CTED TTE 01/2016 (cone): LA 35, EF 65% RV dilated, TAPSE 19, tr Tr, can't est PASP RHC 11/2015 (cone): RA 16, PA 81/36 (55), PCW 18-20 CO/CI 5.8/2.7 (f), PA sat 58%, Ao sat 79%(?), PVR ~6 Given 5 mg verapamil and 1000 ug NTG without change in pressures  CTA chest 01/2016: No filling defect within the central or segmental PAs.  Marked enlargement of the pulmonary artery trunk, 5 cm Heart size is mildly enlarged with asymmetric enlargement of RA/RV Prominent right hilar lymph nodes measuring up to 1.6 cm. Nonspecific sub cm mediastinal nodes.  Patchy areas of hazy attenuation could relate to atelectasis versus small airways disease.  4 mm left lower pulmonary nodule  V/Q scan 11/2015: heterogeneous distribution of ventilation  large ventilation defects are noted to the lateral RLL, right upper and the left apex. several large segmental perfusion defects are noted involving the RUL, RLL.  Corresponding ventilation abnormalities noted as above. Intermediate probability for pulmonary embolus.   6MWT 05/19/18: 280 meters. HR 90->130, O2 sat 96% RA->93% RA. Max Borg 6. 6MWT 05/2017: 305 meters. HR 91->136, O2 sat 100% RA->93% RA. Max Borg 4. Exertional oximetry 08/2016: 1 flight, mod pace. O2 sat 95% RA->84% RA 6MWT 08/2016: 320 meters, HR to 119, lowest sat 90% 6MWT 03/2016: 236 meters, HR 78->102, O2 sat 88% Ra, 93% 2L->88% 2l, ->88% 3l, -->95% 4L 6MWT 01/2016: 304  meters. HR 77->102, O2 sat 92-95% RA  Pulmonary Function Testing: Date FEV1 FVC TLC FRC RV DLCO   01/2016 1.74 (71%) 2.16 (71%) 14.2 (61%)  06/2014 1.93 (78%) 2.32 (76%) 16 (62%)  Overnight oximetry 06/2017 (CPAP/O2): low sats, 87% avg, 77% nadir, per patient accidentally on 2LPM instead of 6 LPM, reordered. PSG 06/2013 significant for AHI 33 and severe hypoxemia (sat nadir 42%, almost all night <90%).  Relevant Serologies:  Labs 12/2015: ANA neg, RF/CCP neg, ANCA neg HIV neg   EKG:  EKG is personally reviewed.  The ekg ordered 04/19/19 demonstrates sinus rhythm with PAC, RAE, RAD, RVH, ST-T changes consistent with strain  Recent Labs: 12/22/2018: ALT 15; Hemoglobin 13.4; Magnesium 1.3; Platelets 147.0; TSH 2.87 01/04/2019: NT-Pro BNP 4,514 01/11/2019: BUN 27; Creatinine, Ser 1.42; Potassium 3.8; Sodium 137  Recent Lipid Panel    Component Value Date/Time   CHOL 119 12/22/2018 1441   CHOL 148 06/22/2017 1053   TRIG 99.0 12/22/2018 1441   HDL 30.00 (L) 12/22/2018 1441   HDL 36 (L) 06/22/2017 1053   CHOLHDL 4 12/22/2018 1441   VLDL 19.8 12/22/2018 1441   LDLCALC 69 12/22/2018 1441   LDLCALC 86 06/22/2017 1053    Physical Exam:    VS:  BP 126/76   Pulse 80   Ht _0  (1.651 m)   Wt 222 lb (100.7 kg)   LMP 05/11/2013   BMI 36.94 kg/m     Wt Readings from Last 3 Encounters:  10/18/19 222 lb (100.7 kg)  10/16/19 225 lb 11.2 oz (102.4 kg)  10/05/19 223 lb 12.8 oz (101.5 kg)    Speaking comfortably on the phone, no audible wheezing In no acute distress Alert and oriented Normal affect Normal speech  ASSESSMENT:    1. PAH (pulmonary artery hypertension) ([place])   2. Dyspnea on exertion   3. Chronic respiratory failure with hypoxia (HCC)   4. Bilateral leg edema    PLAN:    Known pulmonary hypertension, felt to be WHO class I on remodulin, followed by Dr. Harrietta Guardian. Her cardiomegaly, dyspnea on exertion, and LE edema are sequelae of this disease. Chronic hypoxic  respiratory failure on O2 and OSA on CPAP. -she continues to follow with [facility], receiving excellent care. She has however noted increased symptoms and weight gain. I recommended torsemide BID x 3 days, then back to daily. She prefers to discuss with Dr. Harrietta Guardian.  -Discussed re-establishing with Dr. Haroldine Laws for advanced heart failure/PAH, especially if medications need to be ordered through [place]. -if admitted, she would need to be followed by advanced heart failure given remodulin infusion -she is on home O2 for her chronic respiratory failure and CPAP for her OSA -NYHA class 3, as she is winded easily with minor household activities -per notes, CTEPH not felt to be strong contributor  Plan for follow up: 6 mos  Today, I have spent 30 minutes with the patient with telehealth technology discussing the above problems.  Additional time spent in chart review, documentation, and communication.  Buford Dresser, MD, PhD [place]  [facility]    Medication Adjustments/Labs and Tests Ordered: Current medicines are reviewed at length with the patient today.  Concerns regarding medicines are outlined above.  No orders of the defined types were placed in this encounter.  No orders of the defined types were placed in this encounter.   Patient Instructions  Medication Instructions:  Your Physician recommend you continue on your current medication as directed.    *If you need a refill on your cardiac medications before your next appointment, please call your pharmacy*   Lab Work: None   Testing/Procedures: None   Follow-Up: At [facility], you and your health needs are  our priority.  As part of our continuing mission to provide you with exceptional heart care, we have created designated Provider Care Teams.  These Care Teams include your primary Cardiologist (physician) and Advanced Practice Providers (APPs -  Physician Assistants and Nurse Practitioners) who all work together to  provide you with the care you need, when you need it.  We recommend signing up for the patient portal called "MyChart".  Sign up information is provided on this After Visit Summary.  MyChart is used to connect with patients for Virtual Visits (Telemedicine).  Patients are able to view lab/test results, encounter notes, upcoming appointments, etc.  Non-urgent messages can be sent to your provider as well.   To learn more about what you can do with MyChart, go to [email or web address].    Your next appointment:   6 month(s)  The format for your next appointment:   In Person  Provider:   Buford Dresser, MD     Signed, Buford Dresser, MD PhD 10/18/2019    [place]

## 2019-10-18 NOTE — Patient Instructions (Signed)
Medication Instructions:  Your Physician recommend you continue on your current medication as directed.    *If you need a refill on your cardiac medications before your next appointment, please call your pharmacy*   Lab Work: None   Testing/Procedures: None   Follow-Up: At [facility], you and your health needs are our priority.  As part of our continuing mission to provide you with exceptional heart care, we have created designated Provider Care Teams.  These Care Teams include your primary Cardiologist (physician) and Advanced Practice Providers (APPs -  Physician Assistants and Nurse Practitioners) who all work together to provide you with the care you need, when you need it.  We recommend signing up for the patient portal called "MyChart".  Sign up information is provided on this After Visit Summary.  MyChart is used to connect with patients for Virtual Visits (Telemedicine).  Patients are able to view lab/test results, encounter notes, upcoming appointments, etc.  Non-urgent messages can be sent to your provider as well.   To learn more about what you can do with MyChart, go to [email or web address].    Your next appointment:   6 month(s)  The format for your next appointment:   In Person  Provider:   Buford Dresser, MD

## 2019-10-26 ENCOUNTER — Telehealth: Payer: Self-pay | Admitting: Internal Medicine

## 2019-10-26 ENCOUNTER — Encounter: Payer: Self-pay | Admitting: Internal Medicine

## 2019-10-26 DIAGNOSIS — E669 Obesity, unspecified: Secondary | ICD-10-CM | POA: Insufficient documentation

## 2019-10-26 DIAGNOSIS — I2782 Chronic pulmonary embolism: Secondary | ICD-10-CM | POA: Insufficient documentation

## 2019-10-26 DIAGNOSIS — R748 Abnormal levels of other serum enzymes: Secondary | ICD-10-CM | POA: Insufficient documentation

## 2019-10-26 DIAGNOSIS — D696 Thrombocytopenia, unspecified: Secondary | ICD-10-CM | POA: Insufficient documentation

## 2019-10-26 NOTE — Telephone Encounter (Signed)
Call pt make sure not missing doses of xarelto 20 mg and takes every night please

## 2019-10-27 NOTE — Telephone Encounter (Signed)
Patient states she has missed some does and wants to know if she needs to start over with her dosing or just take the 20 mg nightly from now on? Please advise

## 2019-10-27 NOTE — Telephone Encounter (Signed)
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 Lacinda Axon pulmonary this message please

## 2019-10-30 NOTE — Telephone Encounter (Signed)
Spoken to patient. She stated the only reason she missed dosages was due to the pharmacy wouldn't give her the medication when she needed it. She had to wait. She stated she hasn't missed any doses since.

## 2019-10-30 NOTE — Telephone Encounter (Signed)
Noted  

## 2019-11-01 ENCOUNTER — Encounter (HOSPITAL_COMMUNITY): Payer: 59

## 2019-11-01 ENCOUNTER — Encounter (HOSPITAL_COMMUNITY): Admission: RE | Admit: 2019-11-01 | Payer: 59 | Source: Ambulatory Visit

## 2019-11-08 ENCOUNTER — Other Ambulatory Visit: Payer: Self-pay | Admitting: Internal Medicine

## 2019-11-08 ENCOUNTER — Telehealth: Payer: Self-pay | Admitting: Internal Medicine

## 2019-11-08 DIAGNOSIS — M109 Gout, unspecified: Secondary | ICD-10-CM

## 2019-11-08 MED ORDER — COLCHICINE 0.6 MG PO CAPS: 1.0000 | ORAL_CAPSULE | ORAL | 11 refills | Status: DC | PRN

## 2019-11-08 NOTE — Telephone Encounter (Signed)
[facility] and see [facility] [place]  Thanks [place]

## 2019-11-08 NOTE — Telephone Encounter (Signed)
Pt called in need refill out of town [facility] [place] [facility] number is [phone] medication is Colchicine (MITIGARE) 0.6 MG CAPS

## 2019-11-10 ENCOUNTER — Other Ambulatory Visit: Payer: Self-pay | Admitting: Internal Medicine

## 2019-11-10 DIAGNOSIS — M109 Gout, unspecified: Secondary | ICD-10-CM

## 2019-11-10 MED ORDER — COLCHICINE 0.6 MG PO CAPS: 1.0000 | ORAL_CAPSULE | ORAL | 11 refills | Status: DC | PRN

## 2019-11-10 NOTE — Telephone Encounter (Signed)
Patient informed and verbalized understanding.  States that she is back home and will get this from [facility]

## 2019-11-10 NOTE — Telephone Encounter (Signed)
[facility] [place] and they state that I would have to call the [facility] [place] and have them pull it over. [facility] [place] and they were unable to see this in their system.   This will have to be e-scribed to the Pharmacy in [place].

## 2019-11-10 NOTE — Telephone Encounter (Signed)
[facility] and [facility] please

## 2020-01-01 ENCOUNTER — Other Ambulatory Visit: Payer: Self-pay | Admitting: Internal Medicine

## 2020-01-01 MED ORDER — IBUPROFEN 800 MG PO TABS: 800.0000 mg | ORAL_TABLET | Freq: Three times a day (TID) | ORAL | 2 refills | Status: DC | PRN

## 2020-01-02 ENCOUNTER — Other Ambulatory Visit: Payer: 59

## 2020-01-02 DIAGNOSIS — Z20822 Contact with and (suspected) exposure to covid-19: Secondary | ICD-10-CM

## 2020-01-03 LAB — SARS-COV-2, NAA 2 DAY TAT

## 2020-01-03 LAB — NOVEL CORONAVIRUS, NAA: SARS-CoV-2, NAA: NOT DETECTED

## 2020-01-09 ENCOUNTER — Encounter: Payer: 59 | Admitting: Internal Medicine

## 2020-01-09 ENCOUNTER — Telehealth: Payer: Self-pay | Admitting: Internal Medicine

## 2020-01-09 NOTE — Telephone Encounter (Signed)
Patient no-showed today's appointment; appointment was for 10/12 at 11 am, provider notified for review of record. Mychart message sent for patient to re-schedule.

## 2020-02-08 ENCOUNTER — Other Ambulatory Visit: Payer: Self-pay

## 2020-02-08 DIAGNOSIS — M109 Gout, unspecified: Secondary | ICD-10-CM

## 2020-02-08 MED ORDER — ALLOPURINOL 300 MG PO TABS: 300.0000 mg | ORAL_TABLET | Freq: Every day | ORAL | 1 refills | Status: DC

## 2020-02-29 ENCOUNTER — Other Ambulatory Visit: Payer: Self-pay

## 2020-02-29 ENCOUNTER — Ambulatory Visit (INDEPENDENT_AMBULATORY_CARE_PROVIDER_SITE_OTHER): Payer: 59 | Admitting: Internal Medicine

## 2020-02-29 ENCOUNTER — Encounter: Payer: Self-pay | Admitting: Internal Medicine

## 2020-02-29 VITALS — BP 124/76 | HR 84 | Temp 98.2°F | Ht 65.55 in | Wt 232.2 lb

## 2020-02-29 DIAGNOSIS — Z0184 Encounter for antibody response examination: Secondary | ICD-10-CM

## 2020-02-29 DIAGNOSIS — Z1329 Encounter for screening for other suspected endocrine disorder: Secondary | ICD-10-CM

## 2020-02-29 DIAGNOSIS — I272 Pulmonary hypertension, unspecified: Secondary | ICD-10-CM

## 2020-02-29 DIAGNOSIS — R053 Chronic cough: Secondary | ICD-10-CM

## 2020-02-29 DIAGNOSIS — E559 Vitamin D deficiency, unspecified: Secondary | ICD-10-CM

## 2020-02-29 DIAGNOSIS — Z780 Asymptomatic menopausal state: Secondary | ICD-10-CM

## 2020-02-29 DIAGNOSIS — K760 Fatty (change of) liver, not elsewhere classified: Secondary | ICD-10-CM | POA: Diagnosis not present

## 2020-02-29 DIAGNOSIS — R7989 Other specified abnormal findings of blood chemistry: Secondary | ICD-10-CM

## 2020-02-29 DIAGNOSIS — R7303 Prediabetes: Secondary | ICD-10-CM

## 2020-02-29 DIAGNOSIS — Z0000 Encounter for general adult medical examination without abnormal findings: Secondary | ICD-10-CM

## 2020-02-29 DIAGNOSIS — J309 Allergic rhinitis, unspecified: Secondary | ICD-10-CM

## 2020-02-29 DIAGNOSIS — E119 Type 2 diabetes mellitus without complications: Secondary | ICD-10-CM

## 2020-02-29 DIAGNOSIS — E612 Magnesium deficiency: Secondary | ICD-10-CM

## 2020-02-29 DIAGNOSIS — J45909 Unspecified asthma, uncomplicated: Secondary | ICD-10-CM

## 2020-02-29 DIAGNOSIS — M109 Gout, unspecified: Secondary | ICD-10-CM

## 2020-02-29 DIAGNOSIS — R0982 Postnasal drip: Secondary | ICD-10-CM

## 2020-02-29 DIAGNOSIS — Z13818 Encounter for screening for other digestive system disorders: Secondary | ICD-10-CM

## 2020-02-29 NOTE — Patient Instructions (Addendum)
Tiffany Bad, MD  [place], [place] [address]  [phone] (Work)  [phone] (Fax)   Consider pfizer booster   Dr. Garwin [facility] -call to schedule  [address] [phone]   Anastrozole tablets What is this medicine? ANASTROZOLE (an AS troe zole) is used to treat breast cancer in women who have gone through menopause. Some types of breast cancer depend on estrogen to grow, and this medicine can stop tumor growth by blocking estrogen production. This medicine may be used for other purposes; ask your health care provider or pharmacist if you have questions. COMMON BRAND NAME(S): Arimidex What should I tell my health care provider before I take this medicine? They need to know if you have any of these conditions:  bone problems  heart disease  high cholesterol  an unusual or allergic reaction to anastrozole, other medicines, foods, dyes, or preservatives  pregnant or trying to get pregnant  breast-feeding How should I use this medicine? Take this medicine by mouth with a glass of water. Follow the directions on the prescription label. You can take it with or without food. If it upsets your stomach, take it with food. Take your medicine at regular intervals. Do not take it more often than directed. Do not stop taking except on your doctor's advice. Talk to your pediatrician regarding the use of this medicine in children. Special care may be needed. Overdosage: If you think you have taken too much of this medicine contact a poison control center or emergency room at once. NOTE: This medicine is only for you. Do not share this medicine with others. What if I miss a dose? If you miss a dose, take it as soon as you can. If it is almost time for your next dose, take only that dose. Do not take double or extra doses. What may interact with this medicine? This medicine may interact with the following  medications:  female hormones, like estrogens or progestins and birth control pills, patches, rings, or injections  tamoxifen This list may not describe all possible interactions. Give your health care provider a list of all the medicines, herbs, non-prescription drugs, or dietary supplements you use. Also tell them if you smoke, drink alcohol, or use illegal drugs. Some items may interact with your medicine. What should I watch for while using this medicine? Visit your doctor or health care professional for regular checks on your progress. Let your doctor or health care professional know about any unusual vaginal bleeding. Do not become pregnant while taking this medicine or for at least 3 weeks after stopping it. Women should inform their doctor if they wish to become pregnant or think they might be pregnant. There is a potential for serious side effects to an unborn child. Talk to your health care professional or pharmacist for more information. Do not breast-feed an infant while taking this medicine or for 2 weeks after stopping it. This medicine may interfere with the ability to have a child. Talk with your doctor or health care professional if you are concerned about your fertility. Using this medicine for a long time may increase your risk of low bone mass. Talk to your doctor about bone health. You should make sure that you get enough calcium and vitamin D while you are taking this medicine. Discuss the foods you eat and the vitamins you take with your health care professional. What side effects may I notice from receiving  this medicine? Side effects that you should report to your doctor or health care professional as soon as possible:  allergic reactions like skin rash, itching or hives, swelling of the face, lips, or tongue  signs and symptoms of a blood clot such as breathing problems; changes in vision; chest pain; sudden headache; pain, swelling, warmth in the leg; trouble speaking;  sudden numbness or weakness of the face, arm, or leg  signs and symptoms of infection like fever or chills; cough; sore throat; pain or trouble passing urine Side effects that usually do not require medical attention (report to your doctor or health care professional if they continue or are bothersome):  bone pain  dizziness  hair loss  headache  hot flashes  joint pain  muscle pain  signs of decreased red blood cells - unusually weak or tired, feeling faint or lightheaded, falls  vaginal discharge, itching, or odor in women This list may not describe all possible side effects. Call your doctor for medical advice about side effects. You may report side effects to FDA at 1-800-FDA-1088. Where should I keep my medicine? Keep out of the reach of children. Store at room temperature between 20 and 25 degrees C (68 and 77 degrees F). Throw away any unused medicine after the expiration date. NOTE: This sheet is a summary. It may not cover all possible information. If you have questions about this medicine, talk to your doctor, pharmacist, or health care provider.  2020 Elsevier/Gold Standard (2017-03-29 14:56:51)  Menopause Menopause is the normal time of life when menstrual periods stop completely. It is usually confirmed by 12 months without a menstrual period. The transition to menopause (perimenopause) most often happens between the ages of 5 and 74. During perimenopause, hormone levels change in your body, which can cause symptoms and affect your health. Menopause may increase your risk for:  Loss of bone (osteoporosis), which causes bone breaks (fractures).  Depression.  Hardening and narrowing of the arteries (atherosclerosis), which can cause heart attacks and strokes. What are the causes? This condition is usually caused by a natural change in hormone levels that happens as you get older. The condition may also be caused by surgery to remove both ovaries (bilateral  oophorectomy). What increases the risk? This condition is more likely to start at an earlier age if you have certain medical conditions or treatments, including:  A tumor of the pituitary gland in the brain.  A disease that affects the ovaries and hormone production.  Radiation treatment for cancer.  Certain cancer treatments, such as chemotherapy or hormone (anti-estrogen) therapy.  Heavy smoking and excessive alcohol use.  Family history of early menopause. This condition is also more likely to develop earlier in women who are very thin. What are the signs or symptoms? Symptoms of this condition include:  Hot flashes.  Irregular menstrual periods.  Night sweats.  Changes in feelings about sex. This could be a decrease in sex drive or an increased comfort around your sexuality.  Vaginal dryness and thinning of the vaginal walls. This may cause painful intercourse.  Dryness of the skin and development of wrinkles.  Headaches.  Problems sleeping (insomnia).  Mood swings or irritability.  Memory problems.  Weight gain.  Hair growth on the face and chest.  Bladder infections or problems with urinating. How is this diagnosed? This condition is diagnosed based on your medical history, a physical exam, your age, your menstrual history, and your symptoms. Hormone tests may also be done. How is  this treated? In some cases, no treatment is needed. You and your health care provider should make a decision together about whether treatment is necessary. Treatment will be based on your individual condition and preferences. Treatment for this condition focuses on managing symptoms. Treatment may include:  Menopausal hormone therapy (MHT).  Medicines to treat specific symptoms or complications.  Acupuncture.  Vitamin or herbal supplements. Before starting treatment, make sure to let your health care provider know if you have a personal or family history of:  Heart  disease.  Breast cancer.  Blood clots.  Diabetes.  Osteoporosis. Follow these instructions at home: Lifestyle  Do not use any products that contain nicotine or tobacco, such as cigarettes and e-cigarettes. If you need help quitting, ask your health care provider.  Get at least 30 minutes of physical activity on 5 or more days each week.  Avoid alcoholic and caffeinated beverages, as well as spicy foods. This may help prevent hot flashes.  Get 7-8 hours of sleep each night.  If you have hot flashes, try: ? Dressing in layers. ? Avoiding things that may trigger hot flashes, such as spicy food, warm places, or stress. ? Taking slow, deep breaths when a hot flash starts. ? Keeping a fan in your home and office.  Find ways to manage stress, such as deep breathing, meditation, or journaling.  Consider going to group therapy with other women who are having menopause symptoms. Ask your health care provider about recommended group therapy meetings. Eating and drinking  Eat a healthy, balanced diet that contains whole grains, lean protein, low-fat dairy, and plenty of fruits and vegetables.  Your health care provider may recommend adding more soy to your diet. Foods that contain soy include tofu, tempeh, and soy milk.  Eat plenty of foods that contain calcium and vitamin D for bone health. Items that are rich in calcium include low-fat milk, yogurt, beans, almonds, sardines, broccoli, and kale. Medicines  Take over-the-counter and prescription medicines only as told by your health care provider.  Talk with your health care provider before starting any herbal supplements. If prescribed, take vitamins and supplements as told by your health care provider. These may include: ? Calcium. Women age 35 and older should get 1,200 mg (milligrams) of calcium every day. ? Vitamin D. Women need 600-800 International Units of vitamin D each day. ? Vitamins B12 and B6. Aim for 50 micrograms of B12  and 1.5 mg of B6 each day. General instructions  Keep track of your menstrual periods, including: ? When they occur. ? How heavy they are and how long they last. ? How much time passes between periods.  Keep track of your symptoms, noting when they start, how often you have them, and how long they last.  Use vaginal lubricants or moisturizers to help with vaginal dryness and improve comfort during sex.  Keep all follow-up visits as told by your health care provider. This is important. This includes any group therapy or counseling. Contact a health care provider if:  You are still having menstrual periods after age 33.  You have pain during sex.  You have not had a period for 12 months and you develop vaginal bleeding. Get help right away if:  You have: ? Severe depression. ? Excessive vaginal bleeding. ? Pain when you urinate. ? A fast or irregular heart beat (palpitations). ? Severe headaches. ? Abdomen (abdominal) pain or severe indigestion.  You fell and you think you have a broken bone.  You develop leg or chest pain.  You develop vision problems.  You feel a lump in your breast. Summary  Menopause is the normal time of life when menstrual periods stop completely. It is usually confirmed by 12 months without a menstrual period.  The transition to menopause (perimenopause) most often happens between the ages of 53 and 75.  Symptoms can be managed through medicines, lifestyle changes, and complementary therapies such as acupuncture.  Eat a balanced diet that is rich in nutrients to promote bone health and heart health and to manage symptoms during menopause. This information is not intended to replace advice given to you by your health care provider. Make sure you discuss any questions you have with your health care provider. Document Revised: 02/26/2017 Document Reviewed: 04/18/2016 Elsevier Patient Education  2020 [employer].

## 2020-02-29 NOTE — Progress Notes (Signed)
Chief Complaint  Patient presents with  . Annual Exam   Annual  1. 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 Harrietta Guardian labs 02/15/20 K 3.2, ca 8.2, Mag 1.1 on mag 800 mg bid Weight is up 10 lbs  F/u Dr. Harrietta Guardian [facility]   2. Elevated probnp   3. H/o gout on allopurinol 600 mg qd per rheum will repeat uric acid   4. H/o left breast cancer needs to f/u Dr. Lindi Adie appt 10/15/20   Review of Systems  Constitutional: Negative for weight loss.  HENT: Negative for hearing loss.   Eyes: Negative for blurred vision.  Respiratory: Positive for shortness of breath.   Cardiovascular: Negative for chest pain.  Gastrointestinal: Negative for abdominal pain.  Skin: Negative for rash.  Neurological: Negative for headaches.  Psychiatric/Behavioral: Negative for depression. The patient is not nervous/anxious.    Past Medical History:  Diagnosis Date  . Anemia    iron deficiency  . Arthritis   . Borderline diabetes   . Complication of anesthesia    woke up slowly- after hysterectomy- 2015  . Diabetes mellitus, type II ([place])   . DVT (deep venous thrombosis) ([place]) 2014   left leg  . Family history of breast cancer   . Gout   . HTN (hypertension)   . 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 Menorrhagia    secondary to uterine fibroids  . OSA (obstructive sleep apnea)    07/25/13 HST AHI 33/hr, severe hypoxemia O2 min 42% and 95% of the time <89%  . PE (pulmonary embolism) 2014   bilateral  . Prediabetes   . Pulmonary artery hypertension ([place])   . Pulmonary nodule    (58m on loeft lower lobe) found on CT scan July 2014, repeat scan Jan 2015 showed less than 41m . Right  ovarian cyst    noted 09/2012   . S/P TAH (total abdominal hysterectomy) 06/07/2013  . Trichomoniasis    05/2011    Past Surgical History:  Procedure Laterality Date  . ABDOMINAL HYSTERECTOMY N/A 06/07/2013   Procedure: HYSTERECTOMY ABDOMINAL WITH BIALTERAL SALPINGECTOMY;  Surgeon: VaElveria RoyalsMD;  [facility];  Service: Gynecology;  Laterality: N/A;  . BIOPSY  09/02/2017   Procedure: BIOPSY;  Surgeon: ScWilford CornerMD;  [facility];  Service: Endoscopy;;  . BREAST LUMPECTOMY Left 07/27/2016   BREAST LUMPECTOMY WITH RADIOACTIVE SEED AND SENTINEL LYMPH NODE BIOPSY (Left)  . BREAST LUMPECTOMY WITH RADIOACTIVE SEED AND SENTINEL LYMPH NODE BIOPSY Left 07/27/2016   Procedure: BREAST LUMPECTOMY WITH RADIOACTIVE SEED AND SENTINEL LYMPH NODE BIOPSY;  Surgeon: FaStark KleinMD;  Location: [facility] Service: General;  Laterality: Left;  . CARDIAC CATHETERIZATION N/A 12/24/2015   Procedure: Right Heart Cath;  Surgeon: JaAdrian ProwsMD;  Location: [facility];  Service: Cardiovascular;  Laterality: N/A;  . CESAREAN SECTION    . COLONOSCOPY    . COLONOSCOPY WITH PROPOFOL N/A 09/02/2017   Procedure: COLONOSCOPY WITH PROPOFOL;  Surgeon: ScWilford CornerMD;  [facility];  Service: Endoscopy;  Laterality: N/A;  . ESOPHAGOGASTRODUODENOSCOPY (EGD) WITH PROPOFOL N/A 09/02/2017   Procedure: ESOPHAGOGASTRODUODENOSCOPY (EGD) WITH PROPOFOL;  Surgeon: Wilford Corner, MD;  Location[facility];  Service: Endoscopy;  Laterality: N/A;  . IR CV LINE INJECTION  07/27/2017  . POLYPECTOMY  2008   Removal of uterine polyp  . PORT-A-CATH REMOVAL Right 09/09/2017   Procedure: REMOVAL PORT-A-CATH;  Surgeon: Stark Klein, MD;  Location: [facility];  Service: General;  Laterality: Right;  . PORTA CATH INSERTION  07/27/2016  . PORTACATH PLACEMENT Right 07/27/2016   Procedure: INSERTION PORT-A-CATH;  Surgeon: Stark Klein, MD;  Location: [facility];  Service: General;  Laterality:  Right;  . SUBMUCOSAL INJECTION  09/02/2017   Procedure: SUBMUCOSAL INJECTION;  Surgeon: Wilford Corner, MD;  [facility];  Service: Endoscopy;;  epi injection   Family History  Problem Relation Age of Onset  . Hypertension Mother   . Kidney disease Mother   . Hypertension Father   . Stroke Sister   . Breast cancer Maternal Grandmother        died at 77  . Breast cancer Paternal Grandmother   . Breast cancer Cousin        pat first cousin dx in her 10s  . Hypertension Brother   . Cancer Maternal Aunt        unknown form  . Breast cancer Paternal Aunt   . Colon cancer Other 43       MGMs brother  . Breast cancer Paternal Aunt   . Cancer Other        breast ca in [place]  . Cancer Other        g uncle colon or stomach ca   Social History   Socioeconomic History  . Marital status: Single    Spouse name: Not on file  . Number of children: 1  . Years of education: Not on file  . Highest education level: Not on file  Occupational History  . Not on file  Tobacco Use  . Smoking status: Former Smoker    Packs/day: 0.33    Years: 10.00    Pack years: 3.30    Types: Cigarettes    Quit date: 10/04/2011    Years since quitting: 8.5  . Smokeless tobacco: Never Used  Vaping Use  . Vaping Use: Never used  Substance and Sexual Activity  . Alcohol use: Yes    Alcohol/week: 0.0 standard drinks    Comment: social  . Drug use: No  . Sexual activity: Yes  Other Topics Concern  . 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 acetaminophen (TYLENOL) 500 MG tablet Take 1,000 mg by mouth every 6 (six) hours as needed for moderate pain or headache.   . albuterol (VENTOLIN HFA) 108 (90 Base) MCG/ACT inhaler Inhale into the lungs every 6 (six) hours as needed for wheezing or shortness of breath.  . anastrozole (ARIMIDEX) 1 MG tablet Take 1 tablet (1 mg total) by mouth daily.  . Colchicine (MITIGARE) 0.6 MG CAPS Take 1  capsule by mouth as needed. As needed gout flare max # of pills 2 in 24 hours  . fluticasone (FLONASE) 50 MCG/ACT nasal spray Place into both nostrils daily as needed for allergies or rhinitis.  . Fluticasone-Salmeterol (ADVAIR DISKUS) 250-50 MCG/DOSE AEPB Inhale 1 puff into the lungs 2 (two) times daily as needed.  Marland Kitchen ibuprofen (ADVIL) 800 MG tablet Take 1 tablet (800 mg total) by mouth every 8 (eight) hours as needed.  . loratadine (CLARITIN) 10 MG tablet Take 10 mg by mouth daily.  . metoprolol tartrate (LOPRESSOR) 25 MG tablet Take 1 tablet (25 mg total) by mouth 2 (two) times daily.  . Multiple Vitamin (MULTIVITAMIN) tablet Take 1 tablet by mouth daily.  . OPSUMIT 10 MG TABS Take 10 mg by mouth daily.  Vladimir Faster Glycol-Propyl Glycol (SYSTANE OP) Place 1 drop into both eyes daily as needed (dry eyes).   . rivaroxaban (XARELTO) 20 MG TABS tablet Take 20 mg by mouth daily with supper.  . sildenafil (REVATIO) 20 MG tablet Take 40 mg by mouth 3 (three) times daily.   Marland Kitchen spironolactone (ALDACTONE) 25 MG tablet Take 25 mg by mouth daily.  Marland Kitchen torsemide (DEMADEX) 100 MG tablet Take 100 mg by mouth daily.   Marland Kitchen treprostinil (REMODULIN) 20 MG/20ML injection 20 ng/kg/min by Continuous infusion (non-IV) route continuous.  . triamcinolone cream (KENALOG) 0.1 % Apply 1 application topically daily as needed (leg discoloration).  . [DISCONTINUED] allopurinol (ZYLOPRIM) 300 MG tablet Take 1 tablet (300 mg total) by mouth daily. appt further refills after 6 months call to schedule now please  .  [DISCONTINUED] azelastine (ASTELIN) 0.1 % nasal spray Place 1 spray into both nostrils 2 (two) times daily. Use in each nostril as directed  . [DISCONTINUED] ipratropium-albuterol (DUONEB) 0.5-2.5 (3) MG/3ML SOLN Take 3 mLs by nebulization every 4 (four) hours as needed.   Allergies  Allergen Reactions  . Ampicillin Other (See Comments)    Severe abdominal pain, dizziness (penicillin is okay)  . Amoxicillin Other (See Comments)    LIGHT-HEADED and CLOSE TO "PASSING OUT"  AND ABDOMINAL PAIN Has patient had a PCN reaction causing immediate rash, facial/tongue/throat swelling, SOB or lightheadedness with hypotension: No Has patient had a PCN reaction causing severe rash involving mucus membranes or skin necrosis: No Has patient had a PCN reaction that required hospitalization No Has patient had a PCN reaction occurring within the last 10 years: No If all of the above answers are "NO", then may proceed with Cephalosporin use.  . Indomethacin Other (See Comments)    Gastro irritation  . Nsaids     GI ulcers   . Metronidazole Nausea And Vomiting  . Sulfa Antibiotics Rash and Other (See Comments)    Very bad yeast infection   Recent Results (from the past 2160 hour(s))  Urinalysis, Routine w reflex microscopic     Status: Abnormal   Collection Time: 02/29/20  3:13 PM  Result Value Ref Range   Color, Urine YELLOW YELLOW   APPearance CLEAR CLEAR   Specific Gravity, Urine 1.010 1.001 - 1.03   pH 5.5 5.0 - 8.0   Glucose, UA NEGATIVE NEGATIVE   Bilirubin Urine NEGATIVE NEGATIVE   Ketones, ur NEGATIVE NEGATIVE   Hgb urine dipstick NEGATIVE NEGATIVE   Protein, ur 1+ (A) NEGATIVE   Nitrite NEGATIVE NEGATIVE   Leukocytes,Ua TRACE (A) NEGATIVE   WBC, UA 6-10 (A) 0 - 5 /HPF   RBC / HPF NONE SEEN 0 - 2 /HPF   Squamous  Epithelial / LPF NONE SEEN < OR = 5 /HPF   Bacteria, UA NONE SEEN NONE SEEN /HPF   Hyaline Cast NONE SEEN NONE SEEN /LPF  Microalbumin / creatinine urine ratio     Status:  Abnormal   Collection Time: 02/29/20  3:13 PM  Result Value Ref Range   Creatinine, Urine 84 20 - 275 mg/dL   Microalb, Ur 27.7 mg/dL    Comment: Reference Range Not established    Microalb Creat Ratio 330 (H) <30 mcg/mg creat    Comment: . The ADA defines abnormalities in albumin excretion as follows: Marland Kitchen Albuminuria Category        Result (mcg/mg creatinine) . Normal to Mildly increased   <30 Moderately increased         30-299  Severely increased           > OR = 300 . The ADA recommends that at least two of three specimens collected within a 3-6 month period be abnormal before considering a patient to be within a diagnostic category.   Comprehensive metabolic panel     Status: Abnormal   Collection Time: 03/05/20  2:09 PM  Result Value Ref Range   Glucose 102 (H) 65 - 99 mg/dL   BUN 18 6 - 24 mg/dL   Creatinine, Ser 0.91 0.57 - 1.00 mg/dL   GFR calc non Af Amer 72 >59 mL/min/1.73   GFR calc Af Amer 83 >59 mL/min/1.73    Comment: **In accordance with recommendations from the NKF-ASN Task force,**   [facility] is in the process of updating its eGFR calculation to the   2021 CKD-EPI creatinine equation that estimates kidney function   without a race variable.    BUN/Creatinine Ratio 20 9 - 23   Sodium 143 134 - 144 mmol/L   Potassium 3.3 (L) 3.5 - 5.2 mmol/L   Chloride 104 96 - 106 mmol/L   CO2 23 20 - 29 mmol/L   Calcium 8.7 8.7 - 10.2 mg/dL   Total Protein 7.6 6.0 - 8.5 g/dL   Albumin 4.3 3.8 - 4.9 g/dL   Globulin, Total 3.3 1.5 - 4.5 g/dL   Albumin/Globulin Ratio 1.3 1.2 - 2.2   Bilirubin Total 0.8 0.0 - 1.2 mg/dL   Alkaline Phosphatase 155 (H) 44 - 121 IU/L    Comment:               **Please note reference interval change**   AST 9 0 - 40 IU/L   ALT 7 0 - 32 IU/L  Lipid panel     Status: Abnormal   Collection Time: 03/05/20  2:09 PM  Result Value Ref Range   Cholesterol, Total 190 100 - 199 mg/dL   Triglycerides 105 0 - 149 mg/dL   HDL 52 >39 mg/dL   VLDL  Cholesterol Cal 19 5 - 40 mg/dL   LDL Chol Calc (NIH) 119 (H) 0 - 99 mg/dL   Chol/HDL Ratio 3.7 0.0 - 4.4 ratio    Comment:                                   T. Chol/HDL Ratio                                             Men  Women  1/2 Avg.Risk  3.4    3.3                                   Avg.Risk  5.0    4.4                                2X Avg.Risk  9.6    7.1                                3X Avg.Risk 23.4   11.0   CBC with Differential/Platelet     Status: Abnormal   Collection Time: 03/05/20  2:09 PM  Result Value Ref Range   WBC 6.4 3.4 - 10.8 x10E3/uL   RBC 3.85 3.77 - 5.28 x10E6/uL   Hemoglobin 12.4 11.1 - 15.9 g/dL   Hematocrit 35.4 34.0 - 46.6 %   MCV 92 79 - 97 fL   MCH 32.2 26.6 - 33.0 pg   MCHC 35.0 31.5 - 35.7 g/dL   RDW 13.2 11.7 - 15.4 %   Platelets 130 (L) 150 - 450 x10E3/uL   Neutrophils 74 Not Estab. %   Lymphs 16 Not Estab. %   Monocytes 6 Not Estab. %   Eos 2 Not Estab. %   Basos 1 Not Estab. %   Neutrophils Absolute 4.8 1.4 - 7.0 x10E3/uL   Lymphocytes Absolute 1.0 0.7 - 3.1 x10E3/uL   Monocytes Absolute 0.4 0.1 - 0.9 x10E3/uL   EOS (ABSOLUTE) 0.1 0.0 - 0.4 x10E3/uL   Basophils Absolute 0.0 0.0 - 0.2 x10E3/uL   Immature Granulocytes 1 Not Estab. %   Immature Grans (Abs) 0.0 0.0 - 0.1 x10E3/uL  FSH     Status: None   Collection Time: 03/05/20  2:09 PM  Result Value Ref Range   FSH 43.4 mIU/mL    Comment:                     Adult Female:                       Follicular phase      3.5 -  12.5                       Ovulation phase       4.7 -  21.5                       Luteal phase          1.7 -   7.7                       Postmenopausal       25.8 - 134.8   Hepatitis B surface antibody,quantitative     Status: None   Collection Time: 03/05/20  2:09 PM  Result Value Ref Range   Hepatitis B Surf Ab Quant 84.6 Immunity>9.9 mIU/mL    Comment:   Status of Immunity                     Anti-HBs Level   ------------------                      --------------  Inconsistent with Immunity                   0.0 - 9.9 Consistent with Immunity                          >9.9   Hepatitis A Ab, Total     Status: None   Collection Time: 03/05/20  2:09 PM  Result Value Ref Range   hep A Total Ab Negative Negative  Magnesium     Status: Abnormal   Collection Time: 03/05/20  2:09 PM  Result Value Ref Range   Magnesium 1.2 (L) 1.6 - 2.3 mg/dL  TSH     Status: None   Collection Time: 03/05/20  2:09 PM  Result Value Ref Range   TSH 1.370 0.450 - 4.500 uIU/mL  Hemoglobin A1c     Status: None   Collection Time: 03/05/20  2:09 PM  Result Value Ref Range   Hgb A1c MFr Bld 5.5 4.8 - 5.6 %    Comment:          Prediabetes: 5.7 - 6.4          Diabetes: >6.4          Glycemic control for adults with diabetes: <7.0    Est. average glucose Bld gHb Est-mCnc 111 mg/dL  Uric acid     Status: None   Collection Time: 03/05/20  2:09 PM  Result Value Ref Range   Uric Acid 6.4 3.0 - 7.2 mg/dL    Comment:            Therapeutic target for gout patients: <6.0  VITAMIN D 25 Hydroxy (Vit-D Deficiency, Fractures)     Status: Abnormal   Collection Time: 03/05/20  2:09 PM  Result Value Ref Range   Vit D, 25-Hydroxy 10.6 (L) 30.0 - 100.0 ng/mL    Comment: Vitamin D deficiency has been defined by the [place] and an Endocrine Society practice guideline as a level of serum 25-OH vitamin D less than 20 ng/mL (1,2). The Endocrine Society went on to further define vitamin D insufficiency as a level between 21 and 29 ng/mL (2). 1. IOM (Institute of Medicine). 2010. Dietary reference    intakes for calcium and D. [place]: The    [employer]. 2. Holick MF, Binkley [place], Bischoff-Ferrari HA, et al.    Evaluation, treatment, and prevention of vitamin D    deficiency: an Endocrine Society clinical practice    guideline. JCEM. 2011 Jul; 96(7):1911-30.   Pro b natriuretic peptide     Status: Abnormal   Collection  Time: 03/05/20  2:09 PM  Result Value Ref Range   NT-Pro BNP 1,504 (H) 0 - 249 pg/mL    Comment: The following cut-points have been suggested for the use of proBNP for the diagnostic evaluation of heart failure (HF) in patients with acute dyspnea: Modality                     Age           Optimal Cut                            (years)            Point ------------------------------------------------------ Diagnosis (rule in HF)        <50  450 pg/mL                           50 - 75            900 pg/mL                               >75           1800 pg/mL Exclusion (rule out HF)  Age independent     300 pg/mL   HM MAMMOGRAPHY     Status: None   Collection Time: 03/29/20 12:00 AM  Result Value Ref Range   HM Mammogram 0-4 Bi-Rad 0-4 Bi-Rad, Self Reported Normal    Comment: s/p left breast cancer [facility] neg   Objective  Body mass index is 37.99 kg/m. Wt Readings from Last 3 Encounters:  02/29/20 232 lb 3.2 oz (105.3 kg)  10/18/19 222 lb (100.7 kg)  10/16/19 225 lb 11.2 oz (102.4 kg)   Temp Readings from Last 3 Encounters:  02/29/20 98.2 F (36.8 C) (Oral)  10/16/19 98.3 F (36.8 C) (Temporal)  10/05/19 98.3 F (36.8 C) (Oral)   BP Readings from Last 3 Encounters:  02/29/20 124/76  10/18/19 126/76  10/16/19 129/62   Pulse Readings from Last 3 Encounters:  02/29/20 84  10/18/19 80  10/16/19 81    Physical Exam Vitals and nursing note reviewed.  Constitutional:      Appearance: Normal appearance. She is well-developed and well-groomed. She is obese.  HENT:     Head: Normocephalic and atraumatic.  Eyes:     Conjunctiva/sclera: Conjunctivae normal.     Pupils: Pupils are equal, round, and reactive to light.  Cardiovascular:     Rate and Rhythm: Normal rate and regular rhythm.     Heart sounds: Normal heart sounds. No murmur heard.   Pulmonary:     Effort: Pulmonary effort is normal.     Breath sounds: Normal breath sounds.     Comments: On cont O2   Neurological:     General: No focal deficit present.     Mental Status: She is alert and oriented to person, place, and time. Mental status is at baseline.     Gait: Gait normal.  Psychiatric:        Attention and Perception: Attention and perception normal.        Mood and Affect: Mood and affect normal.        Speech: Speech normal.        Behavior: Behavior normal. Behavior is cooperative.        Thought Content: Thought content normal.        Cognition and Memory: Cognition and memory normal.        Judgment: Judgment normal.     Assessment  Plan  Annual physical exam -  Fluutd MMR immune Consider prevnar and pna 23   Hep A/B utd immune hep B  -still needs hep A vaccine  2021 vs she can get at her pharmacy covid 3/3 pfizer   Tdap utd 06/21/17 Consider shingrix in future and pna 23  Hep C negative11/29/17  Pap smear  -s/p TAH(w/o cervix only ovaries intact)DUB 2/2 fibroids, adenomyosis  -follows with Dr. Mody[facility] pap neg 6/2010last saw 01/2017 Dr. Benjie Karvonen  -obtained records 04/14/17 visit  DEXA [facility] 06/28/17 normal   h/o breast cancer left  mammo 03/29/20 negative [facility] f/u h/o  Dr. Lindi Adie 10/15/2020  [facility] 07/05/2018 abnormal with new right breast cal. H/o left breast cancer -breast bx 09/27/18 right breast negative -f/u [facility] h/o due in 01/2019 and [place] surgery 03/2019 due to insurance reasons initial care was with h/o (Dr. Renold Genta surgery (Dr. Barry Dienes) in [place] of note  Colonoscopy/EGD had 6/6/19[facility] Dr. Schoolerdiverticulosis/hemorrhooids f/u in 10 years  10/05/19 echo  1. Left ventricular ejection fraction, by estimation, is 60 to 65%. The  left ventricle has normal function. The left ventricle has no regional  wall motion abnormalities. Left ventricular diastolic function could not  be evaluated. There is the  interventricular septum is flattened in systole, consistent with right  ventricular pressure overload.  2. Right  ventricular systolic function is moderately reduced. The right  ventricular size is severely enlarged. Tricuspid regurgitation signal is  inadequate for assessing PA pressure.  3. Right atrial size was moderately dilated.  4. The mitral valve is grossly normal. No evidence of mitral valve  regurgitation. No evidence of mitral stenosis.  5. The aortic valve was not well visualized. Aortic valve regurgitation  is not visualized. No aortic stenosis is present.  6. The inferior vena cava is dilated in size with <50% respiratory  variability, suggesting right atrial pressure of 15 mmHg.   She is former smoker from mid 20s age 4 to 57 3 cig per day   Fatty liver - hep B immune still needs hep A vaccine  Gout, unspecified cause, unspecified chronicity, unspecified site - Plan: Uric acid Allopurinol 600 mg qd   Diabetes mellitus without complication (HCC) -5.5 84/1/32  Pulmonary HTN (HCC) stable stable on remodulin, revatio 40 mg tid, torsemide 100 mg-150 qd, spironolactone 25 mg qd  - Plan: Pro b natriuretic peptide Elevated brain natriuretic peptide (BNP) level - Plan: Pro b natriuretic peptide Postnasal drip - Plan: azelastine (ASTELIN) 0.1 % nasal spray Allergic rhinitis, unspecified seasonality, unspecified trigger - Plan: azelastine (ASTELIN) 0.1 % nasal spray Chronic cough with h/o RAD- Plan: ipratropium-albuterol (DUONEB) 0.5-2.5 (3) MG/3ML SOLN  F/u [facility] pulm cc copy of labs  Specialists  pulm [facility] Dr. Harrietta Guardian H/o Dr. Bethanie Dicker GI  Cards leb in [place], Dr. Christopher/ Dr. Haroldine Laws  Surgery Dr. Barry Dienes  Ob/gyn Dr. Benjie Karvonen >Dr. 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 Lacinda Axon cath lab 06/02/2018 [place], worsening HD status on max oral therapy selexipag, sildenafil, macitentan will rec iv remodulin  Of note selexipa 400 mcg bid 05/2016 to 50/2020 stopped due to change to add oral or iv treprostinil  2L with exertion prn and 6 L qhs on cpap  Provider: Dr. Olivia Mackie McLean-Scocuzza-Internal Medicine

## 2020-03-01 LAB — URINALYSIS, ROUTINE W REFLEX MICROSCOPIC
Bacteria, UA: NONE SEEN /HPF
Bilirubin Urine: NEGATIVE
Glucose, UA: NEGATIVE
Hgb urine dipstick: NEGATIVE
Hyaline Cast: NONE SEEN /LPF
Ketones, ur: NEGATIVE
Nitrite: NEGATIVE
RBC / HPF: NONE SEEN /HPF (ref 0–2)
Specific Gravity, Urine: 1.01 (ref 1.001–1.03)
Squamous Epithelial / HPF: NONE SEEN /HPF (ref <=5)
pH: 5.5 (ref 5.0–8.0)

## 2020-03-01 LAB — MICROALBUMIN / CREATININE URINE RATIO
Creatinine, Urine: 84 mg/dL (ref 20–275)
Microalb Creat Ratio: 330 mcg/mg creat — ABNORMAL HIGH (ref <30)
Microalb, Ur: 27.7 mg/dL

## 2020-03-06 LAB — CBC WITH DIFFERENTIAL/PLATELET
Basophils Absolute: 0 10*3/uL (ref 0.0–0.2)
Basos: 1 %
EOS (ABSOLUTE): 0.1 10*3/uL (ref 0.0–0.4)
Eos: 2 %
Hematocrit: 35.4 % (ref 34.0–46.6)
Hemoglobin: 12.4 g/dL (ref 11.1–15.9)
Immature Grans (Abs): 0 10*3/uL (ref 0.0–0.1)
Immature Granulocytes: 1 %
Lymphocytes Absolute: 1 10*3/uL (ref 0.7–3.1)
Lymphs: 16 %
MCH: 32.2 pg (ref 26.6–33.0)
MCHC: 35 g/dL (ref 31.5–35.7)
MCV: 92 fL (ref 79–97)
Monocytes Absolute: 0.4 10*3/uL (ref 0.1–0.9)
Monocytes: 6 %
Neutrophils Absolute: 4.8 10*3/uL (ref 1.4–7.0)
Neutrophils: 74 %
Platelets: 130 10*3/uL — ABNORMAL LOW (ref 150–450)
RBC: 3.85 x10E6/uL (ref 3.77–5.28)
RDW: 13.2 % (ref 11.7–15.4)
WBC: 6.4 10*3/uL (ref 3.4–10.8)

## 2020-03-06 LAB — LIPID PANEL
Chol/HDL Ratio: 3.7 ratio (ref 0.0–4.4)
Cholesterol, Total: 190 mg/dL (ref 100–199)
HDL: 52 mg/dL (ref >39)
LDL Chol Calc (NIH): 119 mg/dL — ABNORMAL HIGH (ref 0–99)
Triglycerides: 105 mg/dL (ref 0–149)
VLDL Cholesterol Cal: 19 mg/dL (ref 5–40)

## 2020-03-06 LAB — COMPREHENSIVE METABOLIC PANEL
ALT: 7 IU/L (ref 0–32)
AST: 9 IU/L (ref 0–40)
Albumin/Globulin Ratio: 1.3 (ref 1.2–2.2)
Albumin: 4.3 g/dL (ref 3.8–4.9)
Alkaline Phosphatase: 155 IU/L — ABNORMAL HIGH (ref 44–121)
BUN/Creatinine Ratio: 20 (ref 9–23)
BUN: 18 mg/dL (ref 6–24)
Bilirubin Total: 0.8 mg/dL (ref 0.0–1.2)
CO2: 23 mmol/L (ref 20–29)
Calcium: 8.7 mg/dL (ref 8.7–10.2)
Chloride: 104 mmol/L (ref 96–106)
Creatinine, Ser: 0.91 mg/dL (ref 0.57–1.00)
GFR calc Af Amer: 83 mL/min/{1.73_m2} (ref >59)
GFR calc non Af Amer: 72 mL/min/{1.73_m2} (ref >59)
Globulin, Total: 3.3 g/dL (ref 1.5–4.5)
Glucose: 102 mg/dL — ABNORMAL HIGH (ref 65–99)
Potassium: 3.3 mmol/L — ABNORMAL LOW (ref 3.5–5.2)
Sodium: 143 mmol/L (ref 134–144)
Total Protein: 7.6 g/dL (ref 6.0–8.5)

## 2020-03-06 LAB — PRO B NATRIURETIC PEPTIDE: NT-Pro BNP: 1504 pg/mL — ABNORMAL HIGH (ref 0–249)

## 2020-03-06 LAB — HEPATITIS A ANTIBODY, TOTAL: hep A Total Ab: NEGATIVE

## 2020-03-06 LAB — HEMOGLOBIN A1C
Est. average glucose Bld gHb Est-mCnc: 111 mg/dL
Hgb A1c MFr Bld: 5.5 % (ref 4.8–5.6)

## 2020-03-06 LAB — FOLLICLE STIMULATING HORMONE: FSH: 43.4 m[IU]/mL

## 2020-03-06 LAB — URIC ACID: Uric Acid: 6.4 mg/dL (ref 3.0–7.2)

## 2020-03-06 LAB — VITAMIN D 25 HYDROXY (VIT D DEFICIENCY, FRACTURES): Vit D, 25-Hydroxy: 10.6 ng/mL — ABNORMAL LOW (ref 30.0–100.0)

## 2020-03-06 LAB — TSH: TSH: 1.37 u[IU]/mL (ref 0.450–4.500)

## 2020-03-06 LAB — MAGNESIUM: Magnesium: 1.2 mg/dL — ABNORMAL LOW (ref 1.6–2.3)

## 2020-03-06 LAB — HEPATITIS B SURFACE ANTIBODY, QUANTITATIVE: Hepatitis B Surf Ab Quant: 84.6 m[IU]/mL (ref >9.9)

## 2020-03-14 ENCOUNTER — Ambulatory Visit (INDEPENDENT_AMBULATORY_CARE_PROVIDER_SITE_OTHER): Payer: 59

## 2020-03-14 ENCOUNTER — Other Ambulatory Visit: Payer: Self-pay

## 2020-03-14 DIAGNOSIS — Z23 Encounter for immunization: Secondary | ICD-10-CM | POA: Diagnosis not present

## 2020-03-14 DIAGNOSIS — K754 Autoimmune hepatitis: Secondary | ICD-10-CM

## 2020-03-14 NOTE — Progress Notes (Signed)
Patient presented for Hep A injection to right deltoid, patient voiced no concerns nor showed any signs of distress during injection.

## 2020-03-19 ENCOUNTER — Telehealth (HOSPITAL_COMMUNITY): Payer: Self-pay

## 2020-03-19 NOTE — Telephone Encounter (Signed)
Attempted to call patient in regards to Pulmonary Rehab - LM on VM

## 2020-03-26 ENCOUNTER — Telehealth (HOSPITAL_COMMUNITY): Payer: Self-pay | Admitting: *Deleted

## 2020-03-26 ENCOUNTER — Encounter (HOSPITAL_COMMUNITY): Payer: Self-pay | Admitting: *Deleted

## 2020-03-26 NOTE — Progress Notes (Signed)
Received pulmonary rehab referral from Dr. Harrietta Guardian at [facility] with the diagnosis of [place].  Review medical history, noted that pt is on continuous IV medication - Remodulin for PAH. Will need to check for the availability of this medication on this campus in case of emergency with the pump or the dislodgement of the catheter during exercise.  Called in patient pharmacy for the availability.  The medication is listed in their formulary but they currently do not have any in stock.  Will need to discuss with the buyer the possibility of having this medication on hand during this pt 9 week participation. Also left message with Heart failure clinic requesting call back.  Message left for Chera to discuss further with pt management of this IV medication and emergency care. What she is to do with pump failure and catheter dislodgement. Contact number provided. Cherre Huger, BSN Cardiac and Pulmonary Rehab Nurse Navigator    Psychologist, clinical, BSN Cardiac and Training and development officer

## 2020-03-26 NOTE — Telephone Encounter (Signed)
Received pulmonary rehab referral from Dr. Harrietta Guardian at [facility] with the diagnosis of [place].  Review medical history, noted that pt is on continuous IV medication - Remodulin for PAH. Called and left message requesting call back.  Would like to discuss further with pt management of this IV medication and emergency care. Contact number provided. Cherre Huger, BSN Cardiac and Training and development officer

## 2020-03-29 LAB — HM MAMMOGRAPHY

## 2020-04-02 ENCOUNTER — Encounter: Payer: Self-pay | Admitting: Internal Medicine

## 2020-04-02 ENCOUNTER — Other Ambulatory Visit: Payer: Self-pay

## 2020-04-02 MED ORDER — AZELASTINE HCL 0.1 % NA SOLN: 1.0000 | Freq: Two times a day (BID) | NASAL | 12 refills | Status: DC

## 2020-04-02 MED ORDER — ALLOPURINOL 300 MG PO TABS: 600.0000 mg | ORAL_TABLET | Freq: Every day | ORAL | 3 refills | Status: DC

## 2020-04-02 MED ORDER — IPRATROPIUM-ALBUTEROL 0.5-2.5 (3) MG/3ML IN SOLN: 3.0000 mL | RESPIRATORY_TRACT | 12 refills | Status: DC | PRN

## 2020-04-03 ENCOUNTER — Ambulatory Visit (INDEPENDENT_AMBULATORY_CARE_PROVIDER_SITE_OTHER): Payer: 59 | Admitting: Family Medicine

## 2020-04-03 ENCOUNTER — Encounter: Payer: Self-pay | Admitting: Family Medicine

## 2020-04-03 VITALS — BP 114/58 | HR 93 | Temp 98.1°F | Ht 65.55 in | Wt 233.0 lb

## 2020-04-03 DIAGNOSIS — R300 Dysuria: Secondary | ICD-10-CM | POA: Diagnosis not present

## 2020-04-03 DIAGNOSIS — N3090 Cystitis, unspecified without hematuria: Secondary | ICD-10-CM | POA: Insufficient documentation

## 2020-04-03 LAB — POCT URINALYSIS DIPSTICK
Bilirubin, UA: NEGATIVE
Blood, UA: NEGATIVE
Glucose, UA: NEGATIVE
Nitrite, UA: POSITIVE
Protein, UA: POSITIVE — AB
Spec Grav, UA: 1.015 (ref 1.010–1.025)
Urobilinogen, UA: 2 E.U./dL — AB
pH, UA: 6 (ref 5.0–8.0)

## 2020-04-03 LAB — URINALYSIS, MICROSCOPIC ONLY

## 2020-04-03 MED ORDER — CIPROFLOXACIN HCL 250 MG PO TABS: 250.0000 mg | ORAL_TABLET | Freq: Two times a day (BID) | ORAL | 0 refills | Status: DC

## 2020-04-03 NOTE — Progress Notes (Signed)
Tommi Rumps, MD Phone: [phone]  [name] is a 55 y.o. female who presents today for same day visit.   UTI: Dysuria- yes Frequency- yes  Urgency- yes  Hematuria- no  Abd pain- some suprapubic discomfort  Vaginal d/c- no Started about a week ago.  She reports ciprofloxacin is typically what she has taken in the past for UTIs.   Social History   Tobacco Use  Smoking Status Former Smoker  . Packs/day: 0.33  . Years: 10.00  . Pack years: 3.30  . Types: Cigarettes  . Quit date: 10/04/2011  . Years since quitting: 8.5  Smokeless Tobacco Never Used    Current Outpatient Medications on File Prior to Visit  Medication Sig Dispense Refill  . acetaminophen (TYLENOL) 500 MG tablet Take 1,000 mg by mouth every 6 (six) hours as needed for moderate pain or headache.     . albuterol (VENTOLIN HFA) 108 (90 Base) MCG/ACT inhaler Inhale into the lungs every 6 (six) hours as needed for wheezing or shortness of breath.    . allopurinol (ZYLOPRIM) 300 MG tablet Take 2 tablets (600 mg total) by mouth daily. appt further refills after 6 months call to schedule now please 90 tablet 3  . anastrozole (ARIMIDEX) 1 MG tablet Take 1 tablet (1 mg total) by mouth daily. 90 tablet 3  . azelastine (ASTELIN) 0.1 % nasal spray Place 1 spray into both nostrils 2 (two) times daily. Use in each nostril as directed 30 mL 12  . Colchicine (MITIGARE) 0.6 MG CAPS Take 1 capsule by mouth as needed. As needed gout flare max # of pills 2 in 24 hours 30 capsule 11  . fluticasone (FLONASE) 50 MCG/ACT nasal spray Place into both nostrils daily as needed for allergies or rhinitis.    . Fluticasone-Salmeterol (ADVAIR) 250-50 MCG/DOSE AEPB Inhale 1 puff into the lungs 2 (two) times daily as needed.    Marland Kitchen ibuprofen (ADVIL) 800 MG tablet Take 1 tablet (800 mg total) by mouth every 8 (eight) hours as needed. 90 tablet 2  . ipratropium-albuterol (DUONEB) 0.5-2.5 (3) MG/3ML SOLN Take 3 mLs by nebulization every 4 (four) hours  as needed. 360 mL 12  . loratadine (CLARITIN) 10 MG tablet Take 10 mg by mouth daily.    . metoprolol tartrate (LOPRESSOR) 25 MG tablet Take 1 tablet (25 mg total) by mouth 2 (two) times daily. 180 tablet 3  . Multiple Vitamin (MULTIVITAMIN) tablet Take 1 tablet by mouth daily.    . OPSUMIT 10 MG TABS Take 10 mg by mouth daily.  8  . Polyethyl Glycol-Propyl Glycol (SYSTANE OP) Place 1 drop into both eyes daily as needed (dry eyes).     . rivaroxaban (XARELTO) 20 MG TABS tablet Take 20 mg by mouth daily with supper.    . sildenafil (REVATIO) 20 MG tablet Take 40 mg by mouth 3 (three) times daily.     Marland Kitchen spironolactone (ALDACTONE) 25 MG tablet Take 25 mg by mouth daily.    Marland Kitchen torsemide (DEMADEX) 100 MG tablet Take 100 mg by mouth daily.     Marland Kitchen treprostinil (REMODULIN) 20 MG/20ML injection 20 ng/kg/min by Continuous infusion (non-IV) route continuous.    . triamcinolone cream (KENALOG) 0.1 % Apply 1 application topically daily as needed (leg discoloration). 454 g 2  . [DISCONTINUED] prochlorperazine (COMPAZINE) 10 MG tablet Take 1 tablet (10 mg total) by mouth every 6 (six) hours as needed (Nausea or vomiting). 30 tablet 1   Current Facility-Administered Medications on File Prior  to Visit  Medication Dose Route Frequency Provider Last Rate Last Admin  . heparin lock flush 100 unit/mL  500 Units Intracatheter Once PRN Nicholas Lose, MD      . sodium chloride flush (NS) 0.9 % injection 10 mL  10 mL Intracatheter PRN Nicholas Lose, MD         ROS see history of present illness  Objective  Physical Exam Vitals:   04/03/20 1136  BP: (!) 114/58  Pulse: 93  Temp: 98.1 F (36.7 C)  SpO2: 95%    BP Readings from Last 3 Encounters:  04/03/20 (!) 114/58  02/29/20 124/76  10/18/19 126/76   Wt Readings from Last 3 Encounters:  04/03/20 233 lb (105.7 kg)  02/29/20 232 lb 3.2 oz (105.3 kg)  10/18/19 222 lb (100.7 kg)    Physical Exam Constitutional:      General: She is not in acute  distress. Abdominal:     General: Bowel sounds are normal.     Palpations: Abdomen is soft.     Tenderness: There is abdominal tenderness (Mild suprapubic tenderness). There is no guarding or rebound.  Neurological:     Mental Status: She is alert.      Assessment/Plan: Please see individual problem list.  Problem List Items Addressed This Visit    Cystitis    Symptoms and urinalysis consistent with UTI.  We will treat with ciprofloxacin given her antibiotic allergies.  Her chronic respiratory failure makes Macrobid less optimal as a treatment.  Discussed risk of neuropathy and tendon rupture with the ciprofloxacin.  We will send urine for culture and microscopy.       Other Visit Diagnoses    Dysuria    -  Primary   Relevant Orders   Urine Microscopic Only   POCT Urinalysis Dipstick (Completed)   Urine Culture       This visit occurred during the SARS-CoV-2 public health emergency.  Safety protocols were in place, including screening questions prior to the visit, additional usage of staff PPE, and extensive cleaning of exam room while observing appropriate contact time as indicated for disinfecting solutions.    Tommi Rumps, MD [place]

## 2020-04-03 NOTE — Assessment & Plan Note (Signed)
Symptoms and urinalysis consistent with UTI.  We will treat with ciprofloxacin given her antibiotic allergies.  Her chronic respiratory failure makes Macrobid less optimal as a treatment.  Discussed risk of neuropathy and tendon rupture with the ciprofloxacin.  We will send urine for culture and microscopy.

## 2020-04-03 NOTE — Patient Instructions (Signed)
Nice to see you. We will treat you with ciprofloxacin for UTI. We will contact you with your urine culture results. If you develop worsening symptoms or fevers please let us know right away.

## 2020-04-04 ENCOUNTER — Telehealth: Payer: Self-pay

## 2020-04-04 NOTE — Telephone Encounter (Signed)
Faxed physician order to [facility] about bone density test @ [phone] on 04/04/20

## 2020-04-05 LAB — URINE CULTURE
MICRO NUMBER:: 11385079
SPECIMEN QUALITY:: ADEQUATE

## 2020-04-10 ENCOUNTER — Ambulatory Visit: Payer: 59 | Admitting: Internal Medicine

## 2020-04-11 ENCOUNTER — Other Ambulatory Visit: Payer: Self-pay

## 2020-04-11 DIAGNOSIS — I10 Essential (primary) hypertension: Secondary | ICD-10-CM

## 2020-04-11 MED ORDER — METOPROLOL TARTRATE 25 MG PO TABS: 25.0000 mg | ORAL_TABLET | Freq: Two times a day (BID) | ORAL | 3 refills | Status: DC

## 2020-04-17 ENCOUNTER — Ambulatory Visit: Payer: 59 | Admitting: Cardiology

## 2020-04-17 NOTE — Progress Notes (Incomplete)
No show

## 2020-06-13 ENCOUNTER — Telehealth (HOSPITAL_COMMUNITY): Payer: Self-pay

## 2020-06-13 NOTE — Telephone Encounter (Signed)
Called patient to see if she is interested in the Pulmonary Rehab Program. Patient expressed interest. Explained scheduling process and went over insurance, patient verbalized understanding. Someone from our pulmonary rehab staff will contact pt at a later time. 

## 2020-06-13 NOTE — Telephone Encounter (Signed)
Pt insurance is active and benefits verified through [place] 0, DED $950/$950 met, out of pocket $2,900/$2,900 met, co-insurance 30%. no pre-authorization required. Passport, Stephanie/[employer] 06/13/2020_0 :37pm, REF# [id number].

## 2020-06-28 ENCOUNTER — Encounter: Payer: Self-pay | Admitting: Internal Medicine

## 2020-07-03 ENCOUNTER — Encounter: Payer: Self-pay | Admitting: Internal Medicine

## 2020-07-03 ENCOUNTER — Telehealth (INDEPENDENT_AMBULATORY_CARE_PROVIDER_SITE_OTHER): Payer: 59 | Admitting: Internal Medicine

## 2020-07-03 ENCOUNTER — Other Ambulatory Visit: Payer: Self-pay

## 2020-07-03 VITALS — Ht 65.55 in | Wt 237.0 lb

## 2020-07-03 DIAGNOSIS — R790 Abnormal level of blood mineral: Secondary | ICD-10-CM

## 2020-07-03 DIAGNOSIS — R748 Abnormal levels of other serum enzymes: Secondary | ICD-10-CM

## 2020-07-03 DIAGNOSIS — R053 Chronic cough: Secondary | ICD-10-CM

## 2020-07-03 DIAGNOSIS — I272 Pulmonary hypertension, unspecified: Secondary | ICD-10-CM

## 2020-07-03 DIAGNOSIS — E559 Vitamin D deficiency, unspecified: Secondary | ICD-10-CM

## 2020-07-03 DIAGNOSIS — D696 Thrombocytopenia, unspecified: Secondary | ICD-10-CM

## 2020-07-03 DIAGNOSIS — N3000 Acute cystitis without hematuria: Secondary | ICD-10-CM

## 2020-07-03 DIAGNOSIS — U071 COVID-19: Secondary | ICD-10-CM

## 2020-07-03 DIAGNOSIS — J45909 Unspecified asthma, uncomplicated: Secondary | ICD-10-CM | POA: Diagnosis not present

## 2020-07-03 DIAGNOSIS — I10 Essential (primary) hypertension: Secondary | ICD-10-CM

## 2020-07-03 DIAGNOSIS — J9611 Chronic respiratory failure with hypoxia: Secondary | ICD-10-CM

## 2020-07-03 DIAGNOSIS — Z0184 Encounter for antibody response examination: Secondary | ICD-10-CM

## 2020-07-03 DIAGNOSIS — K760 Fatty (change of) liver, not elsewhere classified: Secondary | ICD-10-CM

## 2020-07-03 MED ORDER — FLUTICASONE-SALMETEROL 250-50 MCG/DOSE IN AEPB: 1.0000 | INHALATION_SPRAY | Freq: Two times a day (BID) | RESPIRATORY_TRACT | 12 refills | Status: DC | PRN

## 2020-07-03 MED ORDER — CHOLECALCIFEROL 1.25 MG (50000 UT) PO CAPS: 50000.0000 [IU] | ORAL_CAPSULE | ORAL | 1 refills | Status: DC

## 2020-07-03 MED ORDER — SILDENAFIL CITRATE 20 MG PO TABS: 20.0000 mg | ORAL_TABLET | Freq: Three times a day (TID) | ORAL | 0 refills | Status: AC

## 2020-07-03 MED ORDER — ALBUTEROL SULFATE HFA 108 (90 BASE) MCG/ACT IN AERS: 1.0000 | INHALATION_SPRAY | Freq: Four times a day (QID) | RESPIRATORY_TRACT | 11 refills | Status: DC | PRN

## 2020-07-03 MED ORDER — IPRATROPIUM-ALBUTEROL 0.5-2.5 (3) MG/3ML IN SOLN: 3.0000 mL | RESPIRATORY_TRACT | 12 refills | Status: DC | PRN

## 2020-07-03 NOTE — Progress Notes (Signed)
Virtual Visit via Video Note  I connected with [name]  on 07/03/20 at 11:20 AM EDT by a video enabled telemedicine application and verified that I am speaking with the correct person using two identifiers.  Location patient: home, [place] Location provider:work or home office Persons participating in the virtual visit: patient, provider  I discussed the limitations of evaluation and management by telemedicine and the availability of in person appointments. The patient expressed understanding and agreed to proceed.   HPI: 1. covid 52 + home test 3/31 and 06/28/20 [facility] on chronic O2 due to pulm HTN and hypoxia f/u [facility] pulm Dr. Harrietta Guardian sob stable had 3/3 covid 19 shots. She needs refills of albuterol inhaler, advair, duoneb and she is on covid 19 pill last day of 5 is today sx's were runny nose, sneezing, fever, chills, cold, cough productive denies wheezing    -COVID-19 vaccine status: 3/3 and dx covid 3/31 and 06/28/20  ROS: See pertinent positives and negatives per HPI.  Past Medical History:  Diagnosis Date  . Anemia    iron deficiency  . Arthritis   . Borderline diabetes   . Complication of anesthesia    woke up slowly- after hysterectomy- 2015  . COVID-19   . Diabetes mellitus, type II ([place])   . DVT (deep venous thrombosis) ([place]) 2014   left leg  . Family history of breast cancer   . Gout   . HTN (hypertension)   . 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 Menorrhagia    secondary to uterine fibroids  . OSA (obstructive sleep apnea)    07/25/13 HST AHI 33/hr, severe hypoxemia O2 min 42% and 95% of the time <89%  . PE (pulmonary embolism) 2014   bilateral  . Prediabetes   . Pulmonary artery hypertension ([place])   . Pulmonary nodule    (53m on loeft lower lobe) found on CT scan July 2014, repeat scan Jan 2015 showed less than 472m . Right ovarian cyst    noted 09/2012   . S/P TAH (total abdominal hysterectomy) 06/07/2013  .  Trichomoniasis    05/2011     Past Surgical History:  Procedure Laterality Date  . ABDOMINAL HYSTERECTOMY N/A 06/07/2013   Procedure: HYSTERECTOMY ABDOMINAL WITH BIALTERAL SALPINGECTOMY;  Surgeon: VaElveria RoyalsMD;  [facility];  Service: Gynecology;  Laterality: N/A;  . BIOPSY  09/02/2017   Procedure: BIOPSY;  Surgeon: ScWilford CornerMD;  [facility];  Service: Endoscopy;;  . BREAST LUMPECTOMY Left 07/27/2016   BREAST LUMPECTOMY WITH RADIOACTIVE SEED AND SENTINEL LYMPH NODE BIOPSY (Left)  . BREAST LUMPECTOMY WITH RADIOACTIVE SEED AND SENTINEL LYMPH NODE BIOPSY Left 07/27/2016   Procedure: BREAST LUMPECTOMY WITH RADIOACTIVE SEED AND SENTINEL LYMPH NODE BIOPSY;  Surgeon: FaStark KleinMD;  [facility] Service: General;  Laterality: Left;  . CARDIAC CATHETERIZATION N/A 12/24/2015   Procedure: Right Heart Cath;  Surgeon: JaAdrian ProwsMD;  [facility];  Service: Cardiovascular;  Laterality: N/A;  . CESAREAN SECTION    . COLONOSCOPY    . COLONOSCOPY WITH PROPOFOL N/A 09/02/2017   Procedure: COLONOSCOPY WITH PROPOFOL;  Surgeon: ScWilford CornerMD;  [facility];  Service: Endoscopy;  Laterality: N/A;  . ESOPHAGOGASTRODUODENOSCOPY (EGD) WITH PROPOFOL N/A 09/02/2017   Procedure: ESOPHAGOGASTRODUODENOSCOPY (EGD) WITH PROPOFOL;  Surgeon: ScWilford CornerMD;  [facility];  Service: Endoscopy;  Laterality: N/A;  . IR CV LINE INJECTION  07/27/2017  .  POLYPECTOMY  2008   Removal of uterine polyp  . PORT-A-CATH REMOVAL Right 09/09/2017   Procedure: REMOVAL PORT-A-CATH;  Surgeon: Stark Klein, MD;  Location: [facility];  Service: General;  Laterality: Right;  . PORTA CATH INSERTION  07/27/2016  . PORTACATH PLACEMENT Right 07/27/2016   Procedure: INSERTION PORT-A-CATH;  Surgeon: Stark Klein, MD;  Location: [facility];  Service: General;  Laterality: Right;  . SUBMUCOSAL INJECTION  09/02/2017   Procedure: SUBMUCOSAL INJECTION;  Surgeon:  Wilford Corner, MD;  [facility];  Service: Endoscopy;;  epi injection     Current Outpatient Medications:  .  acetaminophen (TYLENOL) 500 MG tablet, Take 1,000 mg by mouth every 6 (six) hours as needed for moderate pain or headache. , Disp: , Rfl:  .  allopurinol (ZYLOPRIM) 300 MG tablet, Take 2 tablets (600 mg total) by mouth daily. appt further refills after 6 months call to schedule now please, Disp: 90 tablet, Rfl: 3 .  anastrozole (ARIMIDEX) 1 MG tablet, Take 1 tablet (1 mg total) by mouth daily., Disp: 90 tablet, Rfl: 3 .  azelastine (ASTELIN) 0.1 % nasal spray, Place 1 spray into both nostrils 2 (two) times daily. Use in each nostril as directed, Disp: 30 mL, Rfl: 12 .  Cholecalciferol 1.25 MG (50000 UT) capsule, Take 1 capsule (50,000 Units total) by mouth once a week. d3, Disp: 13 capsule, Rfl: 1 .  Colchicine (MITIGARE) 0.6 MG CAPS, Take 1 capsule by mouth as needed. As needed gout flare max # of pills 2 in 24 hours, Disp: 30 capsule, Rfl: 11 .  fluticasone (FLONASE) 50 MCG/ACT nasal spray, Place into both nostrils daily as needed for allergies or rhinitis., Disp: , Rfl:  .  ibuprofen (ADVIL) 800 MG tablet, Take 1 tablet (800 mg total) by mouth every 8 (eight) hours as needed., Disp: 90 tablet, Rfl: 2 .  loratadine (CLARITIN) 10 MG tablet, Take 10 mg by mouth daily., Disp: , Rfl:  .  metoprolol tartrate (LOPRESSOR) 25 MG tablet, Take 1 tablet (25 mg total) by mouth 2 (two) times daily., Disp: 180 tablet, Rfl: 3 .  Multiple Vitamin (MULTIVITAMIN) tablet, Take 1 tablet by mouth daily., Disp: , Rfl:  .  Nirmatrelvir & Ritonavir (PAXLOVID) 20 x 150 MG & 10 x 100MG TBPK, See package instructions., Disp: , Rfl:  .  OPSUMIT 10 MG TABS, Take 10 mg by mouth daily., Disp: , Rfl: 8 .  Polyethyl Glycol-Propyl Glycol (SYSTANE OP), Place 1 drop into both eyes daily as needed (dry eyes). , Disp: , Rfl:  .  rivaroxaban (XARELTO) 20 MG TABS tablet, Take 20 mg by mouth daily with supper.,  Disp: , Rfl:  .  spironolactone (ALDACTONE) 25 MG tablet, Take 25 mg by mouth daily., Disp: , Rfl:  .  torsemide (DEMADEX) 100 MG tablet, Take 100 mg by mouth daily. , Disp: , Rfl:  .  treprostinil (REMODULIN) 20 MG/20ML injection, 20 ng/kg/min by Continuous infusion (non-IV) route continuous., Disp: , Rfl:  .  triamcinolone cream (KENALOG) 0.1 %, Apply 1 application topically daily as needed (leg discoloration)., Disp: 454 g, Rfl: 2 .  albuterol (VENTOLIN HFA) 108 (90 Base) MCG/ACT inhaler, Inhale 1-2 puffs into the lungs every 6 (six) hours as needed for wheezing or shortness of breath., Disp: 18 g, Rfl: 11 .  Fluticasone-Salmeterol (ADVAIR) 250-50 MCG/DOSE AEPB, Inhale 1 puff into the lungs 2 (two) times daily as needed. Rinse mouth, Disp: 60 each, Rfl: 12 .  ipratropium-albuterol (DUONEB) 0.5-2.5 (3)  MG/3ML SOLN, Take 3 mLs by nebulization every 4 (four) hours as needed., Disp: 360 mL, Rfl: 12 .  sildenafil (REVATIO) 20 MG tablet, Take 1-2 tablets (20-40 mg total) by mouth 3 (three) times daily. As of 07/03/20 taking 20 mg tid per [facility] pulm Dr. Harrietta Guardian, Disp: 10 tablet, Rfl: 0 No current facility-administered medications for this visit.  Facility-Administered Medications Ordered in Other Visits:  .  heparin lock flush 100 unit/mL, 500 Units, Intracatheter, Once PRN, Nicholas Lose, MD .  sodium chloride flush (NS) 0.9 % injection 10 mL, 10 mL, Intracatheter, PRN, Nicholas Lose, MD  EXAM:  VITALS per patient if applicable:  GENERAL: alert, oriented, appears well and in no acute distress  HEENT: atraumatic, conjunttiva clear, no obvious abnormalities on inspection of external nose and ears  NECK: normal movements of the head and neck  LUNGS: on inspection no signs of respiratory distress, breathing rate appears normal, no obvious gross SOB, gasping or wheezing 4L O2 on continuously   CV: no obvious cyanosis  MS: moves all visible extremities without noticeable abnormality  PSYCH/NEURO:  pleasant and cooperative, no obvious depression or anxiety, speech and thought processing grossly intact  ASSESSMENT AND PLAN:  Discussed the following assessment and plan:  COVID-19 with below Reactive airway disease without complication, unspecified asthma severity, unspecified whether persistent - Plan: ipratropium-albuterol (DUONEB) 0.5-2.5 (3) MG/3ML SOLN Given covid instructions quarantine x 14 day s  Chronic cough - Plan: ipratropium-albuterol (DUONEB) 0.5-2.5 (3) MG/3ML SOLN  Pulmonary HTN (HCC) - Plan: albuterol (VENTOLIN HFA) 108 (90 Base) MCG/ACT inhaler, ipratropium-albuterol (DUONEB) 0.5-2.5 (3) MG/3ML SOLN, Fluticasone-Salmeterol (ADVAIR) 250-50 MCG/DOSE AEPB, sildenafil (REVATIO) 20 MG tablet  Chronic respiratory failure with hypoxia (HCC) - Plan: albuterol (VENTOLIN HFA) 108 (90 Base) MCG/ACT inhaler, ipratropium-albuterol (DUONEB) 0.5-2.5 (3) MG/3ML SOLN, Fluticasone-Salmeterol (ADVAIR) 250-50 MCG/DOSE AEPB Vitamin D deficiency - Plan: Cholecalciferol 1.25 MG (50000 UT) capsule   HM Fluutd MMR immune Consider prevnar and pna 23 and shingrix Hep A/B utd immune hep B  -hep A 2/2 consider check immune in future  covid 3/3 pfizer Tdap utd3/25/19 Hep C negative11/29/17  Pap smear  -s/p TAH(w/o cervix only ovaries intact)DUB 2/2 fibroids, adenomyosis  -follows with Dr. Mody/[facility] pap neg 6/2010last saw 01/2017 Dr. Benjie Karvonen  -obtained records 04/14/17 visit  DEX[facility] 06/28/17 normal  h/o breast cancer left mammo 03/29/20 negative [facility] f/u h/o Dr. Lindi Adie 10/15/2020  [facility] 07/05/2018 abnormal with new right breast cal. H/o left breast cancer -breast bx 09/27/18 right breast negative -f/u [facility] h/o due in 01/2019 and [facility] surgery 03/2019 due to insurance reasons initial care was with h/o (Dr. Renold Genta surgery (Dr. Barry Dienes) in [place] of note 03/29/20 [facility] neg except chronic changes left breast   Colonoscopy/EGD had 6/6/19[facility]  Dr. Schoolerdiverticulosis/hemorrhooids f/u in 10 years  10/05/19 echo  1. Left ventricular ejection fraction, by estimation, is 60 to 65%. The  left ventricle has normal function. The left ventricle has no regional  wall motion abnormalities. Left ventricular diastolic function could not  be evaluated. There is the  interventricular septum is flattened in systole, consistent with right  ventricular pressure overload.  2. Right ventricular systolic function is moderately reduced. The right  ventricular size is severely enlarged. Tricuspid regurgitation signal is  inadequate for assessing PA pressure.  3. Right atrial size was moderately dilated.  4. The mitral valve is grossly normal. No evidence of mitral valve  regurgitation. No evidence of mitral stenosis.  5. The aortic valve was not well  visualized. Aortic valve regurgitation  is not visualized. No aortic stenosis is present.  6. The inferior vena cava is dilated in size with <50% respiratory  variability, suggesting right atrial pressure of 15 mmHg.   She is former smoker from mid 22s age 12 to 66 3 cig per day  rec healthy diet and exercise   Specialists  pulm [facility] Dr. Harrietta Guardian H/o Dr. Bethanie Dicker GI  Cards [facility] [place] Dr. Christopher/ Dr. Haroldine Laws  Surgery Dr. Barry Dienes  Ob/gyn Dr. Mody>Dr. 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 Lacinda Axon cath lab 06/02/2018 [place], worsening HD status on max oral therapy selexipag, sildenafil, macitentan will rec iv remodulin  Of note selexipa 400 mcg bid 05/2016 to 50/2020 stopped due to change to add oral or iv  treprostinil  2L with exertion prn and 6 L qhs on cpap   -we discussed possible serious and likely etiologies, options for evaluation and workup, limitations of telemedicine visit vs in person visit, treatment, treatment risks and precautions.    I discussed the assessment and treatment plan with the patient. The patient was provided an opportunity to ask questions and all were answered. The patient agreed with the plan and demonstrated an understanding of the instructions.    Time spent 30 min [name], MD

## 2020-07-03 NOTE — Patient Instructions (Signed)
There is no medication other than over the counter meds:  Mucinex dm green label for cough or robitussin DM Vitamin C 1000 mg daily.  Vitamin D3 4000 Iu (units) daily.  Zinc 100 mg daily.  Quercetin 250-500 mg 2 times per day   Elderberry  Oil of oregano  cepacol or chloroseptic spray  Warm tea with honey and lemon  Hydration  Try to eat though you dont feel like it   Tylenol or Advil  Nasal saline  Flonase   over the counter allergy pill claritin/allegra/xyzal or zyrtec   Monitor pulse oximeter, buy from [facility] if oxygen is less than 90 please go to the hospital.        Are you feeling really sick? Shortness of breath, cough, chest pain?, dizziness? Confusion   If so let me know  If worsening, go to hospital or [facility] clinic Urgent care for further treatment

## 2020-07-03 NOTE — Progress Notes (Signed)
Needing refill on nebulizer supplies/medication.

## 2020-07-11 NOTE — Telephone Encounter (Signed)
Called patient to see if she was interested in participating in the Pulmonary Rehab Program. Patient stated yes. Patient will come in for orientation on 08/16/2020_0 :30am and will attend the 10:15am exercise class.  Tourist information centre manager.

## 2020-07-12 ENCOUNTER — Ambulatory Visit (HOSPITAL_COMMUNITY)
Admission: RE | Admit: 2020-07-12 | Discharge: 2020-07-12 | Disposition: A | Discharge status: Home / Self Care | Payer: 59 | Source: Ambulatory Visit | Attending: Pulmonary Disease | Admitting: Pulmonary Disease

## 2020-07-12 ENCOUNTER — Other Ambulatory Visit: Payer: Self-pay

## 2020-07-12 ENCOUNTER — Other Ambulatory Visit (HOSPITAL_COMMUNITY): Payer: Self-pay | Admitting: Pulmonary Disease

## 2020-07-12 DIAGNOSIS — R059 Cough, unspecified: Secondary | ICD-10-CM | POA: Insufficient documentation

## 2020-08-08 ENCOUNTER — Other Ambulatory Visit: Payer: Self-pay | Admitting: Internal Medicine

## 2020-08-08 DIAGNOSIS — M109 Gout, unspecified: Secondary | ICD-10-CM

## 2020-08-15 ENCOUNTER — Telehealth (HOSPITAL_COMMUNITY): Payer: Self-pay

## 2020-08-15 NOTE — Telephone Encounter (Signed)
Called pt to confirm appointment tomorrow 08/16/20 at 1030. Pt confirmed. Gave her physical directions and instructed her to wear closed toe/heel shoes and a mask. Pt verbalized understanding.

## 2020-08-16 ENCOUNTER — Encounter (HOSPITAL_COMMUNITY): Payer: Self-pay

## 2020-08-16 ENCOUNTER — Encounter (HOSPITAL_COMMUNITY)
Admission: RE | Admit: 2020-08-16 | Discharge: 2020-08-16 | Disposition: A | Discharge status: Home / Self Care | Payer: 59 | Source: Ambulatory Visit | Attending: Cardiology | Admitting: Cardiology

## 2020-08-16 ENCOUNTER — Other Ambulatory Visit: Payer: Self-pay

## 2020-08-16 VITALS — BP 112/74 | HR 89 | Ht 65.0 in | Wt 238.5 lb

## 2020-08-16 DIAGNOSIS — I2721 Secondary pulmonary arterial hypertension: Secondary | ICD-10-CM

## 2020-08-16 NOTE — Progress Notes (Signed)
[name] 55 y.o. female Pulmonary Rehab Orientation Note This patient who was referred to Pulmonary rehab by Dr. Harrietta Guardian at [facility] with the diagnosis of secondary pulmonary arterial hypertension arrived today in Cardiac and Pulmonary Rehab. She arrived ambulatory  with steady gait. She does carry portable oxygen. Per pt, she uses oxygen continuously. Color good, skin warm and dry. Patient is oriented to time and place. Patient's medical history, psychosocial health, and medications reviewed. Psychosocial assessment reveals pt lives alone. Pt is currently disabled. Pt reports her stress level is moderate. Areas of stress/anxiety include Health.  Pt does exhibit signs of depression. Signs of depression include sadness and difficulty falling asleep and difficulty maintaining sleep. PHQ2/9 score 1/6. Pt shows fair coping skills with positive outlook . [name] was offered emotional support and reassurance. Discussed treatment options for depression, such  as medication and counseling, pt declined.  Will continue to monitor and evaluate progress toward psychosocial goal(s) of improving quality of life by decreasing shortness of breath and pt being able to do more things she enjoys. Patient reports she does take medications as prescribed. Patient states she follows a Regular diet. The patient reports no specific efforts to gain or lose weight.. Patient's weight will be monitored closely. Demonstration and practice of PLB using pulse oximeter. Patient able to return demonstration satisfactorily. Safety and hand hygiene in the exercise area reviewed with patient. Patient voices understanding of the information reviewed. Department expectations discussed with patient and achievable goals were set. The patient shows enthusiasm about attending the program and we look forward to working with this nice lady. The patient completed a 6 min walk test today and is scheduled to begin exercise on 08/20/20.  45 minutes was spent on  a variety of activities such as assessment of the patient, obtaining baseline data including height, weight, BMI, and grip strength, verifying medical history, allergies, and current medications, and teaching patient strategies for performing tasks with less respiratory effort with emphasis on pursed lip breathing.  Rick Duff MS, ACSM CEP

## 2020-08-16 NOTE — Progress Notes (Signed)
Pulmonary Rehab Orientation Physical Assessment Note  Physical assessment reveals heart rate is normal, breath sounds clear to auscultation, no wheezes, rales, or rhonchi. Grip strength equal, strong. Distal pulses palpable with trace swelling bilat ankle.  Pt with active bowel sounds with no nausea, vomiting or diarrhea.  Pt has her emergency Remodulin and advised that she should always have her emergency Remodulin with her each time with exercise. Pt verbalized understanding and is looking forward to participating in pulmonary rehab. Cherre Huger, BSN Cardiac and Training and development officer

## 2020-08-16 NOTE — Progress Notes (Signed)
Pulmonary Individual Treatment Plan  Patient Details  Name: [name] MRN: [id number] Date of Birth: 1966-01-23 Referring Provider:    Initial Encounter Date:   Visit Diagnosis: Secondary pulmonary arterial hypertension ([place])  Patient's Home Medications on Admission:   Current Outpatient Medications:  .  acetaminophen (TYLENOL) 500 MG tablet, Take 1,000 mg by mouth every 6 (six) hours as needed for moderate pain or headache. , Disp: , Rfl:  .  albuterol (VENTOLIN HFA) 108 (90 Base) MCG/ACT inhaler, Inhale 1-2 puffs into the lungs every 6 (six) hours as needed for wheezing or shortness of breath., Disp: 18 g, Rfl: 11 .  allopurinol (ZYLOPRIM) 300 MG tablet, TAKE 1 TABLET (300 MG TOTAL) BY MOUTH DAILY. APPT FURTHER REFILLS AFTER 6 MONTHS CALL TO SCHEDULE NOW PLEASE, Disp: 90 tablet, Rfl: 1 .  anastrozole (ARIMIDEX) 1 MG tablet, Take 1 tablet (1 mg total) by mouth daily., Disp: 90 tablet, Rfl: 3 .  azelastine (ASTELIN) 0.1 % nasal spray, Place 1 spray into both nostrils 2 (two) times daily. Use in each nostril as directed, Disp: 30 mL, Rfl: 12 .  Cholecalciferol 1.25 MG (50000 UT) capsule, Take 1 capsule (50,000 Units total) by mouth once a week. d3, Disp: 13 capsule, Rfl: 1 .  Colchicine (MITIGARE) 0.6 MG CAPS, Take 1 capsule by mouth as needed. As needed gout flare max # of pills 2 in 24 hours, Disp: 30 capsule, Rfl: 11 .  fluticasone (FLONASE) 50 MCG/ACT nasal spray, Place into both nostrils daily as needed for allergies or rhinitis., Disp: , Rfl:  .  Fluticasone-Salmeterol (ADVAIR) 250-50 MCG/DOSE AEPB, Inhale 1 puff into the lungs 2 (two) times daily as needed. Rinse mouth, Disp: 60 each, Rfl: 12 .  ibuprofen (ADVIL) 800 MG tablet, Take 1 tablet (800 mg total) by mouth every 8 (eight) hours as needed., Disp: 90 tablet, Rfl: 2 .  ipratropium-albuterol (DUONEB) 0.5-2.5 (3) MG/3ML SOLN, Take 3 mLs by nebulization every 4 (four) hours as needed., Disp: 360 mL, Rfl: 12 .  loratadine  (CLARITIN) 10 MG tablet, Take 10 mg by mouth daily., Disp: , Rfl:  .  Magnesium Cl-Calcium Carbonate (SLOW MAGNESIUM/CALCIUM PO), Take by mouth., Disp: , Rfl:  .  metoprolol tartrate (LOPRESSOR) 25 MG tablet, Take 1 tablet (25 mg total) by mouth 2 (two) times daily., Disp: 180 tablet, Rfl: 3 .  Multiple Vitamin (MULTIVITAMIN) tablet, Take 1 tablet by mouth daily., Disp: , Rfl:  .  Nirmatrelvir & Ritonavir (PAXLOVID) 20 x 150 MG & 10 x 100MG TBPK, See package instructions., Disp: , Rfl:  .  OPSUMIT 10 MG TABS, Take 10 mg by mouth daily., Disp: , Rfl: 8 .  Polyethyl Glycol-Propyl Glycol (SYSTANE OP), Place 1 drop into both eyes daily as needed (dry eyes). , Disp: , Rfl:  .  rivaroxaban (XARELTO) 20 MG TABS tablet, Take 20 mg by mouth daily with supper., Disp: , Rfl:  .  sildenafil (REVATIO) 20 MG tablet, Take 1-2 tablets (20-40 mg total) by mouth 3 (three) times daily. As of 07/03/20 taking 20 mg tid per [facility]m Dr. Harrietta Guardian, Disp: 10 tablet, Rfl: 0 .  spironolactone (ALDACTONE) 25 MG tablet, Take 25 mg by mouth daily., Disp: , Rfl:  .  torsemide (DEMADEX) 100 MG tablet, Take 100 mg by mouth daily. , Disp: , Rfl:  .  treprostinil (REMODULIN) 20 MG/20ML injection, 20 ng/kg/min by Continuous infusion (non-IV) route continuous., Disp: , Rfl:  .  triamcinolone cream (KENALOG) 0.1 %, Apply 1 application topically  daily as needed (leg discoloration)., Disp: 454 g, Rfl: 2 No current facility-administered medications for this encounter.  Facility-Administered Medications Ordered in Other Encounters:  .  heparin lock flush 100 unit/mL, 500 Units, Intracatheter, Once PRN, Lindi Adie, Vinay, MD .  sodium chloride flush (NS) 0.9 % injection 10 mL, 10 mL, Intracatheter, PRN, Nicholas Lose, MD  Past Medical History: Past Medical History:  Diagnosis Date  . Anemia    iron deficiency  . Arthritis   . Borderline diabetes   . Complication of anesthesia    woke up slowly- after hysterectomy- 2015  . COVID-19   .  Diabetes mellitus, type II ([place])   . DVT (deep venous thrombosis) ([place]) 2014   left leg  . Family history of breast cancer   . Gout   . HTN (hypertension)   . 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 Menorrhagia    secondary to uterine fibroids  . OSA (obstructive sleep apnea)    07/25/13 HST AHI 33/hr, severe hypoxemia O2 min 42% and 95% of the time <89%  . PE (pulmonary embolism) 2014   bilateral  . Prediabetes   . Pulmonary artery hypertension ([place])   . Pulmonary nodule    (9m on loeft lower lobe) found on CT scan July 2014, repeat scan Jan 2015 showed less than 425m . Right ovarian cyst    noted 09/2012   . S/P TAH (total abdominal hysterectomy) 06/07/2013  . Trichomoniasis    05/2011     Tobacco Use: Social History   Tobacco Use  Smoking Status Former Smoker  . Packs/day: 0.33  . Years: 10.00  . Pack years: 3.30  . Types: Cigarettes  . Quit date: 10/04/2011  . Years since quitting: 8.8  Smokeless Tobacco Never Used    Labs: Recent Review Flowsheet Data    Labs for ITP Cardiac and Pulmonary Rehab Latest Ref Rng & Units 06/22/2017 12/30/2017 12/22/2018 10/05/2019 03/05/2020   Cholestrol 100 - 199 mg/dL 148 - 119 - 190   LDLCALC 0 - 99 mg/dL 86 - 69 - 119(H)   HDL >39 mg/dL 36(L) - 30.00(L) - 52   Trlycerides 0 - 149 mg/dL 128 - 99.0 - 105   Hemoglobin A1c 4.8 - 5.6 % 6.3(H) 6.2 6.4 5.0 5.5   HCO3 20.0 - 28.0 mmol/L - - - - -   TCO2 0 - 100 mmol/L - - - - -   O2SAT % - - - - -      Capillary Blood Glucose: Lab Results  Component Value Date   GLUCAP 119 (H) 09/09/2017   GLUCAP 113 (H) 09/09/2017   GLUCAP 95 09/02/2017   GLUCAP 108 (H) 07/27/2016   GLUCAP 116 (H) 07/27/2016     Pulmonary Assessment Scores:  Pulmonary Assessment Scores    Row Name 08/16/20 1627         ADL UCSD   ADL Phase Exit     SOB Score total 74           CAT Score   CAT Score 26           mMRC Score   mMRC Score 4            UCSD: Self-administered rating of dyspnea associated with activities of daily living (ADLs) 6-point scale (0 = "not at all" to 5 = "maximal or unable to do because of breathlessness")  Scoring Scores range from 0 to 120.  Minimally important  difference is 5 units  CAT: CAT can identify the health impairment of COPD patients and is better correlated with disease progression.  CAT has a scoring range of zero to 40. The CAT score is classified into four groups of low (less than 10), medium (10 - 20), high (21-30) and very high (31-40) based on the impact level of disease on health status. A CAT score over 10 suggests significant symptoms.  A worsening CAT score could be explained by an exacerbation, poor medication adherence, poor inhaler technique, or progression of COPD or comorbid conditions.  CAT MCID is 2 points  mMRC: mMRC (Modified Medical Research Council) Dyspnea Scale is used to assess the degree of baseline functional disability in patients of respiratory disease due to dyspnea. No minimal important difference is established. A decrease in score of 1 point or greater is considered a positive change.   Pulmonary Function Assessment:  Pulmonary Function Assessment - 08/16/20 1125      Breath   Shortness of Breath Yes;Limiting activity           Exercise Target Goals: Exercise Program Goal: Individual exercise prescription set using results from initial 6 min walk test and THRR while considering  patient's activity barriers and safety.   Exercise Prescription Goal: Initial exercise prescription builds to 30-45 minutes a day of aerobic activity, 2-3 days per week.  Home exercise guidelines will be given to patient during program as part of exercise prescription that the participant will acknowledge.  Activity Barriers & Risk Stratification:  Activity Barriers & Cardiac Risk Stratification - 08/16/20 1121      Activity Barriers & Cardiac Risk Stratification   Activity  Barriers Shortness of Breath;Deconditioning           6 Minute Walk:  6 Minute Walk    Row Name 08/16/20 1634         6 Minute Walk   Phase Initial     Distance 1037 feet     Walk Time 6 minutes     # of Rest Breaks 0     MPH 1.96     METS 2.78     RPE 13     Perceived Dyspnea  2.5     VO2 Peak 9.75     Symptoms Yes (comment)     Comments oxygen saturation dropped to 84% at minute 4, had her stop to perform pursed lip breathing and to increase supplemental oxygen. saO2 immediately came back up to 94%     Resting HR 87 bpm     Resting BP 130/74     Resting Oxygen Saturation  100 %     Exercise Oxygen Saturation  during 6 min walk 84 %     Max Ex. HR 110 bpm     Max Ex. BP 140/78     2 Minute Post BP 126/72           Interval HR   1 Minute HR 87     2 Minute HR 89     3 Minute HR 91     4 Minute HR 103     5 Minute HR 104     6 Minute HR 110     2 Minute Post HR 89     Interval Heart Rate? Yes           Interval Oxygen   Interval Oxygen? Yes     Baseline Oxygen Saturation % 100 %     1 Minute Oxygen  Saturation % 95 %     1 Minute Liters of Oxygen 6 L     2 Minute Oxygen Saturation % 93 %     2 Minute Liters of Oxygen 6 L     3 Minute Oxygen Saturation % 88 %     3 Minute Liters of Oxygen 6 L     4 Minute Oxygen Saturation % 84 %     4 Minute Liters of Oxygen 6 L  Increased to 8L     5 Minute Oxygen Saturation % 95 %     5 Minute Liters of Oxygen 8 L     6 Minute Oxygen Saturation % 92 %     6 Minute Liters of Oxygen 8 L     2 Minute Post Oxygen Saturation % 100 %     2 Minute Post Liters of Oxygen 6 L            Oxygen Initial Assessment:  Oxygen Initial Assessment - 08/16/20 1122      Home Oxygen   Home Oxygen Device Home Concentrator;Portable Concentrator    Sleep Oxygen Prescription Continuous;CPAP    Liters per minute 10    Home Exercise Oxygen Prescription Pulsed    Liters per minute 6    Home Resting Oxygen Prescription Pulsed    Liters  per minute 4    Compliance with Home Oxygen Use Yes      Initial 6 min Walk   Oxygen Used Continuous    Liters per minute 6   Had to increase to 8L     Program Oxygen Prescription   Program Oxygen Prescription Continuous    Liters per minute 6    Comments Will need 8L if walking but can do 6L for seated exercise      Intervention   Short Term Goals To learn and exhibit compliance with exercise, home and travel O2 prescription;To learn and understand importance of monitoring SPO2 with pulse oximeter and demonstrate accurate use of the pulse oximeter.;To learn and understand importance of maintaining oxygen saturations>88%;To learn and demonstrate proper pursed lip breathing techniques or other breathing techniques.;To learn and demonstrate proper use of respiratory medications    Long  Term Goals Exhibits compliance with exercise, home and travel O2 prescription;Verbalizes importance of monitoring SPO2 with pulse oximeter and return demonstration;Maintenance of O2 saturations>88%;Exhibits proper breathing techniques, such as pursed lip breathing or other method taught during program session;Compliance with respiratory medication;Demonstrates proper use of MDI's           Oxygen Re-Evaluation:   Oxygen Discharge (Final Oxygen Re-Evaluation):   Initial Exercise Prescription:   Perform Capillary Blood Glucose checks as needed.  Exercise Prescription Changes:   Exercise Comments:   Exercise Goals and Review:  Exercise Goals    Row Name 08/16/20 1625             Exercise Goals   Increase Physical Activity Yes       Intervention Provide advice, education, support and counseling about physical activity/exercise needs.;Develop an individualized exercise prescription for aerobic and resistive training based on initial evaluation findings, risk stratification, comorbidities and participant's personal goals.       Expected Outcomes Short Term: Attend rehab on a regular basis to  increase amount of physical activity.;Long Term: Add in home exercise to make exercise part of routine and to increase amount of physical activity.;Long Term: Exercising regularly at least 3-5 days a week.       Increase Strength  and Stamina Yes       Intervention Provide advice, education, support and counseling about physical activity/exercise needs.;Develop an individualized exercise prescription for aerobic and resistive training based on initial evaluation findings, risk stratification, comorbidities and participant's personal goals.       Expected Outcomes Short Term: Increase workloads from initial exercise prescription for resistance, speed, and METs.;Short Term: Perform resistance training exercises routinely during rehab and add in resistance training at home;Long Term: Improve cardiorespiratory fitness, muscular endurance and strength as measured by increased METs and functional capacity (6MWT)       Able to understand and use rate of perceived exertion (RPE) scale Yes       Intervention Provide education and explanation on how to use RPE scale       Expected Outcomes Short Term: Able to use RPE daily in rehab to express subjective intensity level;Long Term:  Able to use RPE to guide intensity level when exercising independently       Able to understand and use Dyspnea scale Yes       Intervention Provide education and explanation on how to use Dyspnea scale       Expected Outcomes Short Term: Able to use Dyspnea scale daily in rehab to express subjective sense of shortness of breath during exertion;Long Term: Able to use Dyspnea scale to guide intensity level when exercising independently       Knowledge and understanding of Target Heart Rate Range (THRR) Yes       Intervention Provide education and explanation of THRR including how the numbers were predicted and where they are located for reference       Expected Outcomes Short Term: Able to state/look up THRR;Long Term: Able to use THRR to  govern intensity when exercising independently;Short Term: Able to use daily as guideline for intensity in rehab       Understanding of Exercise Prescription Yes       Intervention Provide education, explanation, and written materials on patient's individual exercise prescription       Expected Outcomes Short Term: Able to explain program exercise prescription;Long Term: Able to explain home exercise prescription to exercise independently              Exercise Goals Re-Evaluation :   Discharge Exercise Prescription (Final Exercise Prescription Changes):   Nutrition:  Target Goals: Understanding of nutrition guidelines, daily intake of sodium <1542m, cholesterol <2017m calories 30% from fat and 7% or less from saturated fats, daily to have 5 or more servings of fruits and vegetables.  Biometrics:  Pre Biometrics - 08/16/20 1625      Pre Biometrics   Grip Strength 25.5 kg            Nutrition Therapy Plan and Nutrition Goals:   Nutrition Assessments:  MEDIFICTS Score Key:  ?70 Need to make dietary changes   40-70 Heart Healthy Diet  ? 40 Therapeutic Level Cholesterol Diet   Picture Your Plate Scores:  <4<03nhealthy dietary pattern with much room for improvement.  41-50 Dietary pattern unlikely to meet recommendations for good health and room for improvement.  51-60 More healthful dietary pattern, with some room for improvement.   >60 Healthy dietary pattern, although there may be some specific behaviors that could be improved.    Nutrition Goals Re-Evaluation:   Nutrition Goals Discharge (Final Nutrition Goals Re-Evaluation):   Psychosocial: Target Goals: Acknowledge presence or absence of significant depression and/or stress, maximize coping skills, provide positive support system. Participant is able  to verbalize types and ability to use techniques and skills needed for reducing stress and depression.  Initial Review & Psychosocial Screening:  Initial  Psych Review & Screening - 08/16/20 1126      Initial Review   Current issues with Current Depression;Current Sleep Concerns   States her depression is because of her illness and not being able to do things she enjoys. She is not currently on any treatment and said she would not be open to any depression medication     Family Dynamics   Good Support System? Yes   Daughters, family and friends     Barriers   Psychosocial barriers to participate in program The patient should benefit from training in stress management and relaxation.      Screening Interventions   Interventions Encouraged to exercise    Expected Outcomes Long Term goal: The participant improves quality of Life and PHQ9 Scores as seen by post scores and/or verbalization of changes;Long Term Goal: Stressors or current issues are controlled or eliminated.           Quality of Life Scores:  Scores of 19 and below usually indicate a poorer quality of life in these areas.  A difference of  2-3 points is a clinically meaningful difference.  A difference of 2-3 points in the total score of the Quality of Life Index has been associated with significant improvement in overall quality of life, self-image, physical symptoms, and general health in studies assessing change in quality of life.  PHQ-9: Recent Review Flowsheet Data    Depression screen [facility] 2/9 08/16/2020 02/29/2020 10/05/2019 12/22/2018 12/30/2017   Decreased Interest 0 0 0 0 0   Down, Depressed, Hopeless 1 0 0 0 0   PHQ - 2 Score 1 0 0 0 0   Altered sleeping 0 0 - - -   Tired, decreased energy 2 0 - - -   Change in appetite 3 0 - - -   Feeling bad or failure about yourself  1 0 - - -   Trouble concentrating 0 0 - - -   Moving slowly or fidgety/restless 0 0 - - -   Suicidal thoughts 0 0 - - -   PHQ-9 Score - 0 - - -   Difficult doing work/chores Somewhat difficult Not difficult at all - - -     Interpretation of Total Score  Total Score Depression Severity:  1-4 =  Minimal depression, 5-9 = Mild depression, 10-14 = Moderate depression, 15-19 = Moderately severe depression, 20-27 = Severe depression   Psychosocial Evaluation and Intervention:  Psychosocial Evaluation - 08/16/20 1628      Psychosocial Evaluation & Interventions   Comments Pt does show signs of depression and admits she has depression. She states the main reason for her depression is due to her illness and all of the things she is limited to doing because of her mental conditon and having to carrying around supplemental oxygen. She has only been on supplemental oxygen for over a year and had to get disability and can no longer work. She struggles with not being able to do the things she enjoys. Her depression is also affecting her sleep and states her sleep schedule is very off.    Expected Outcomes Improve patients quality of life by decreasing shortness of breath with activities with hopes that pt will be able to do more. Also for pt to find healthy ways to relieve stress and to deal with depression, if she  refuses treatment.    Continue Psychosocial Services  Follow up required by counselor           Psychosocial Re-Evaluation:   Psychosocial Discharge (Final Psychosocial Re-Evaluation):   Education: Education Goals: Education classes will be provided on a weekly basis, covering required topics. Participant will state understanding/return demonstration of topics presented.  Learning Barriers/Preferences:  Learning Barriers/Preferences - 08/16/20 1130      Learning Barriers/Preferences   Learning Barriers Sight   Wears glasses   Learning Preferences Written Material;Skilled Demonstration;Individual Instruction           Education Topics: Risk Factor Reduction:  -Group instruction that is supported by a PowerPoint presentation. Instructor discusses the definition of a risk factor, different risk factors for pulmonary disease, and how the heart and lungs work together.      Nutrition for Pulmonary Patient:  -Group instruction provided by PowerPoint slides, verbal discussion, and written materials to support subject matter. The instructor gives an explanation and review of healthy diet recommendations, which includes a discussion on weight management, recommendations for fruit and vegetable consumption, as well as protein, fluid, caffeine, fiber, sodium, sugar, and alcohol. Tips for eating when patients are short of breath are discussed.   Pursed Lip Breathing:  -Group instruction that is supported by demonstration and informational handouts. Instructor discusses the benefits of pursed lip and diaphragmatic breathing and detailed demonstration on how to preform both.     Oxygen Safety:  -Group instruction provided by PowerPoint, verbal discussion, and written material to support subject matter. There is an overview of "What is Oxygen" and "Why do we need it".  Instructor also reviews how to create a safe environment for oxygen use, the importance of using oxygen as prescribed, and the risks of noncompliance. There is a brief discussion on traveling with oxygen and resources the patient may utilize.   Oxygen Equipment:  -Group instruction provided by [facility] Staff utilizing handouts, written materials, and equipment demonstrations.   Signs and Symptoms:  -Group instruction provided by written material and verbal discussion to support subject matter. Warning signs and symptoms of infection, stroke, and heart attack are reviewed and when to call the physician/911 reinforced. Tips for preventing the spread of infection discussed.   Advanced Directives:  -Group instruction provided by verbal instruction and written material to support subject matter. Instructor reviews Advanced Directive laws and proper instruction for filling out document.   Pulmonary Video:  -Group video education that reviews the importance of medication and oxygen compliance, exercise,  good nutrition, pulmonary hygiene, and pursed lip and diaphragmatic breathing for the pulmonary patient.   Exercise for the Pulmonary Patient:  -Group instruction that is supported by a PowerPoint presentation. Instructor discusses benefits of exercise, core components of exercise, frequency, duration, and intensity of an exercise routine, importance of utilizing pulse oximetry during exercise, safety while exercising, and options of places to exercise outside of rehab.     Pulmonary Medications:  -Verbally interactive group education provided by instructor with focus on inhaled medications and proper administration.   Anatomy and Physiology of the Respiratory System and Intimacy:  -Group instruction provided by PowerPoint, verbal discussion, and written material to support subject matter. Instructor reviews respiratory cycle and anatomical components of the respiratory system and their functions. Instructor also reviews differences in obstructive and restrictive respiratory diseases with examples of each. Intimacy, Sex, and Sexuality differences are reviewed with a discussion on how relationships can change when diagnosed with pulmonary disease. Common sexual concerns are reviewed.  MD DAY -A group question and answer session with a medical doctor that allows participants to ask questions that relate to their pulmonary disease state.   OTHER EDUCATION -Group or individual verbal, written, or video instructions that support the educational goals of the pulmonary rehab program.   Holiday Eating Survival Tips:  -Group instruction provided by PowerPoint slides, verbal discussion, and written materials to support subject matter. The instructor gives patients tips, tricks, and techniques to help them not only survive but enjoy the holidays despite the onslaught of food that accompanies the holidays.   Knowledge Questionnaire Score:  Knowledge Questionnaire Score - 08/16/20 1641       Knowledge Questionnaire Score   Pre Score 12/18           Core Components/Risk Factors/Patient Goals at Admission:  Personal Goals and Risk Factors at Admission - 08/16/20 1632      Core Components/Risk Factors/Patient Goals on Admission    Weight Management Yes;Obesity    Intervention Weight Management: Develop a combined nutrition and exercise program designed to reach desired caloric intake, while maintaining appropriate intake of nutrient and fiber, sodium and fats, and appropriate energy expenditure required for the weight goal.;Weight Management: Provide education and appropriate resources to help participant work on and attain dietary goals.;Weight Management/Obesity: Establish reasonable short term and long term weight goals.    Improve shortness of breath with ADL's Yes    Intervention Provide education, individualized exercise plan and daily activity instruction to help decrease symptoms of SOB with activities of daily living.    Expected Outcomes Short Term: Improve cardiorespiratory fitness to achieve a reduction of symptoms when performing ADLs;Long Term: Be able to perform more ADLs without symptoms or delay the onset of symptoms           Core Components/Risk Factors/Patient Goals Review:    Core Components/Risk Factors/Patient Goals at Discharge (Final Review):    ITP Comments:   Comments:

## 2020-08-20 ENCOUNTER — Encounter (HOSPITAL_COMMUNITY)
Admission: RE | Admit: 2020-08-20 | Discharge: 2020-08-20 | Disposition: A | Discharge status: Home / Self Care | Payer: 59 | Source: Ambulatory Visit | Attending: Cardiology | Admitting: Cardiology

## 2020-08-20 ENCOUNTER — Other Ambulatory Visit: Payer: Self-pay

## 2020-08-20 DIAGNOSIS — I2721 Secondary pulmonary arterial hypertension: Secondary | ICD-10-CM | POA: Diagnosis not present

## 2020-08-20 NOTE — Progress Notes (Signed)
Daily Session Note  Patient Details  Name: [name] MRN: [id number] Date of Birth: 1966-01-14 Referring Provider:   [name] Pulmonary Rehab Walk Test from 08/16/2020 in [facility]  Referring Provider Dr. Harrietta Guardian      Encounter Date: 08/20/2020  Check In:  Session Check In - 08/20/20 1153      Check-In   Supervising physician immediately available to respond to emergencies [facility] immediately available    Physician(s) Dr. Maren Beach    Location [facility]    Staff Present Leda Roys, MS, ACSM-CEP, Exercise Physiologist;Annedrea Rosezella [place], RN, MHA;Lisa Ysidro Evert, RN;Olinty Celesta Aver, MS, ACSM CEP, Exercise Physiologist    Virtual Visit No    Medication changes reported     No    Fall or balance concerns reported    No    Tobacco Cessation No Change    Warm-up and Cool-down Performed on first and last piece of equipment    Resistance Training Performed Yes    VAD Patient? No    PAD/SET Patient? No      Pain Assessment   Currently in Pain? No/denies    Multiple Pain Sites No           Capillary Blood Glucose: No results found for this or any previous visit (from the past 24 hour(s)).    Social History   Tobacco Use  Smoking Status Former Smoker  . Packs/day: 0.33  . Years: 10.00  . Pack years: 3.30  . Types: Cigarettes  . Quit date: 10/04/2011  . Years since quitting: 8.8  Smokeless Tobacco Never Used    Goals Met:  Proper associated with RPD/PD & O2 Sat Exercise tolerated well No report of cardiac concerns or symptoms Strength training completed today  Goals Unmet:  Not Applicable  Comments: Service time is from 1030 to 1130 Patient completed first day of exercise and tolerated well with no complaints or concerns    Dr. Fransico Him is Medical Director for Cardiac Rehab at [facility]

## 2020-08-20 NOTE — Progress Notes (Signed)
Pulmonary Individual Treatment Plan  Patient Details  Name: [name] MRN: [id number] Date of Birth: Dec 13, 1965 Referring Provider:   [name] Pulmonary Rehab Walk Test from 08/16/2020 in [place]  Referring Provider Dr. Harrietta Guardian      Initial Encounter Date:  Flowsheet Row Pulmonary Rehab Walk Test from 08/16/2020 in [place]  Date 08/19/20      Visit Diagnosis: Secondary pulmonary arterial hypertension ([place])  Patient's Home Medications on Admission:   Current Outpatient Medications:  .  acetaminophen (TYLENOL) 500 MG tablet, Take 1,000 mg by mouth every 6 (six) hours as needed for moderate pain or headache. , Disp: , Rfl:  .  albuterol (VENTOLIN HFA) 108 (90 Base) MCG/ACT inhaler, Inhale 1-2 puffs into the lungs every 6 (six) hours as needed for wheezing or shortness of breath., Disp: 18 g, Rfl: 11 .  allopurinol (ZYLOPRIM) 300 MG tablet, TAKE 1 TABLET (300 MG TOTAL) BY MOUTH DAILY. APPT FURTHER REFILLS AFTER 6 MONTHS CALL TO SCHEDULE NOW PLEASE, Disp: 90 tablet, Rfl: 1 .  anastrozole (ARIMIDEX) 1 MG tablet, Take 1 tablet (1 mg total) by mouth daily., Disp: 90 tablet, Rfl: 3 .  azelastine (ASTELIN) 0.1 % nasal spray, Place 1 spray into both nostrils 2 (two) times daily. Use in each nostril as directed, Disp: 30 mL, Rfl: 12 .  Cholecalciferol 1.25 MG (50000 UT) capsule, Take 1 capsule (50,000 Units total) by mouth once a week. d3, Disp: 13 capsule, Rfl: 1 .  Colchicine (MITIGARE) 0.6 MG CAPS, Take 1 capsule by mouth as needed. As needed gout flare max # of pills 2 in 24 hours, Disp: 30 capsule, Rfl: 11 .  fluticasone (FLONASE) 50 MCG/ACT nasal spray, Place into both nostrils daily as needed for allergies or rhinitis., Disp: , Rfl:  .  Fluticasone-Salmeterol (ADVAIR) 250-50 MCG/DOSE AEPB, Inhale 1 puff into the lungs 2 (two) times daily as needed. Rinse mouth, Disp: 60 each, Rfl: 12 .  ibuprofen (ADVIL) 800 MG  tablet, Take 1 tablet (800 mg total) by mouth every 8 (eight) hours as needed., Disp: 90 tablet, Rfl: 2 .  ipratropium-albuterol (DUONEB) 0.5-2.5 (3) MG/3ML SOLN, Take 3 mLs by nebulization every 4 (four) hours as needed., Disp: 360 mL, Rfl: 12 .  loratadine (CLARITIN) 10 MG tablet, Take 10 mg by mouth daily., Disp: , Rfl:  .  Magnesium Cl-Calcium Carbonate (SLOW MAGNESIUM/CALCIUM PO), Take by mouth., Disp: , Rfl:  .  metoprolol tartrate (LOPRESSOR) 25 MG tablet, Take 1 tablet (25 mg total) by mouth 2 (two) times daily., Disp: 180 tablet, Rfl: 3 .  Multiple Vitamin (MULTIVITAMIN) tablet, Take 1 tablet by mouth daily., Disp: , Rfl:  .  Nirmatrelvir & Ritonavir (PAXLOVID) 20 x 150 MG & 10 x 100MG TBPK, See package instructions., Disp: , Rfl:  .  OPSUMIT 10 MG TABS, Take 10 mg by mouth daily., Disp: , Rfl: 8 .  Polyethyl Glycol-Propyl Glycol (SYSTANE OP), Place 1 drop into both eyes daily as needed (dry eyes). , Disp: , Rfl:  .  rivaroxaban (XARELTO) 20 MG TABS tablet, Take 20 mg by mouth daily with supper., Disp: , Rfl:  .  sildenafil (REVATIO) 20 MG tablet, Take 1-2 tablets (20-40 mg total) by mouth 3 (three) times daily. As of 07/03/20 taking 20 mg tid per [facility]m Dr. Harrietta Guardian, Disp: 10 tablet, Rfl: 0 .  spironolactone (ALDACTONE) 25 MG tablet, Take 25 mg by mouth daily., Disp: , Rfl:  .  torsemide (DEMADEX) 100 MG tablet, Take 100 mg by mouth daily. , Disp: , Rfl:  .  treprostinil (REMODULIN) 20 MG/20ML injection, 20 ng/kg/min by Continuous infusion (non-IV) route continuous., Disp: , Rfl:  .  triamcinolone cream (KENALOG) 0.1 %, Apply 1 application topically daily as needed (leg discoloration)., Disp: 454 g, Rfl: 2 No current facility-administered medications for this encounter.  Facility-Administered Medications Ordered in Other Encounters:  .  heparin lock flush 100 unit/mL, 500 Units, Intracatheter, Once PRN, Lindi Adie, Vinay, MD .  sodium chloride flush (NS) 0.9 % injection 10 mL, 10 mL,  Intracatheter, PRN, Nicholas Lose, MD  Past Medical History: Past Medical History:  Diagnosis Date  . Anemia    iron deficiency  . Arthritis   . Borderline diabetes   . Complication of anesthesia    woke up slowly- after hysterectomy- 2015  . COVID-19   . Diabetes mellitus, type II ([place])   . DVT (deep venous thrombosis) ([place]) 2014   left leg  . Family history of breast cancer   . Gout   . HTN (hypertension)   . 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 Menorrhagia    secondary to uterine fibroids  . OSA (obstructive sleep apnea)    07/25/13 HST AHI 33/hr, severe hypoxemia O2 min 42% and 95% of the time <89%  . PE (pulmonary embolism) 2014   bilateral  . Prediabetes   . Pulmonary artery hypertension ([place])   . Pulmonary nodule    (80m on loeft lower lobe) found on CT scan July 2014, repeat scan Jan 2015 showed less than 444m . Right ovarian cyst    noted 09/2012   . S/P TAH (total abdominal hysterectomy) 06/07/2013  . Trichomoniasis    05/2011     Tobacco Use: Social History   Tobacco Use  Smoking Status Former Smoker  . Packs/day: 0.33  . Years: 10.00  . Pack years: 3.30  . Types: Cigarettes  . Quit date: 10/04/2011  . Years since quitting: 8.8  Smokeless Tobacco Never Used    Labs: Recent Review Flowsheet Data    Labs for ITP Cardiac and Pulmonary Rehab Latest Ref Rng & Units 06/22/2017 12/30/2017 12/22/2018 10/05/2019 03/05/2020   Cholestrol 100 - 199 mg/dL 148 - 119 - 190   LDLCALC 0 - 99 mg/dL 86 - 69 - 119(H)   HDL >39 mg/dL 36(L) - 30.00(L) - 52   Trlycerides 0 - 149 mg/dL 128 - 99.0 - 105   Hemoglobin A1c 4.8 - 5.6 % 6.3(H) 6.2 6.4 5.0 5.5   HCO3 20.0 - 28.0 mmol/L - - - - -   TCO2 0 - 100 mmol/L - - - - -   O2SAT % - - - - -      Capillary Blood Glucose: Lab Results  Component Value Date   GLUCAP 119 (H) 09/09/2017   GLUCAP 113 (H) 09/09/2017   GLUCAP 95 09/02/2017   GLUCAP 108 (H) 07/27/2016   GLUCAP 116 (H)  07/27/2016     Pulmonary Assessment Scores:  Pulmonary Assessment Scores    Row Name 08/16/20 1627         ADL UCSD   ADL Phase Exit     SOB Score total 74           CAT Score   CAT Score 26           mMRC Score   mMRC Score 4  UCSD: Self-administered rating of dyspnea associated with activities of daily living (ADLs) 6-point scale (0 = "not at all" to 5 = "maximal or unable to do because of breathlessness")  Scoring Scores range from 0 to 120.  Minimally important difference is 5 units  CAT: CAT can identify the health impairment of COPD patients and is better correlated with disease progression.  CAT has a scoring range of zero to 40. The CAT score is classified into four groups of low (less than 10), medium (10 - 20), high (21-30) and very high (31-40) based on the impact level of disease on health status. A CAT score over 10 suggests significant symptoms.  A worsening CAT score could be explained by an exacerbation, poor medication adherence, poor inhaler technique, or progression of COPD or comorbid conditions.  CAT MCID is 2 points  mMRC: mMRC (Modified Medical Research Council) Dyspnea Scale is used to assess the degree of baseline functional disability in patients of respiratory disease due to dyspnea. No minimal important difference is established. A decrease in score of 1 point or greater is considered a positive change.   Pulmonary Function Assessment:  Pulmonary Function Assessment - 08/16/20 1125      Breath   Shortness of Breath Yes;Limiting activity           Exercise Target Goals: Exercise Program Goal: Individual exercise prescription set using results from initial 6 min walk test and THRR while considering  patient's activity barriers and safety.   Exercise Prescription Goal: Initial exercise prescription builds to 30-45 minutes a day of aerobic activity, 2-3 days per week.  Home exercise guidelines will be given to patient during  program as part of exercise prescription that the participant will acknowledge.  Activity Barriers & Risk Stratification:  Activity Barriers & Cardiac Risk Stratification - 08/16/20 1121      Activity Barriers & Cardiac Risk Stratification   Activity Barriers Shortness of Breath;Deconditioning           6 Minute Walk:  6 Minute Walk    Row Name 08/16/20 1634         6 Minute Walk   Phase Initial     Distance 1037 feet     Walk Time 6 minutes     # of Rest Breaks 0     MPH 1.96     METS 2.78     RPE 13     Perceived Dyspnea  2.5     VO2 Peak 9.75     Symptoms Yes (comment)     Comments oxygen saturation dropped to 84% at minute 4, had her stop to perform pursed lip breathing and to increase supplemental oxygen. saO2 immediately came back up to 94%     Resting HR 87 bpm     Resting BP 130/74     Resting Oxygen Saturation  100 %     Exercise Oxygen Saturation  during 6 min walk 84 %     Max Ex. HR 110 bpm     Max Ex. BP 140/78     2 Minute Post BP 126/72           Interval HR   1 Minute HR 87     2 Minute HR 89     3 Minute HR 91     4 Minute HR 103     5 Minute HR 104     6 Minute HR 110     2 Minute Post HR 89  Interval Heart Rate? Yes           Interval Oxygen   Interval Oxygen? Yes     Baseline Oxygen Saturation % 100 %     1 Minute Oxygen Saturation % 95 %     1 Minute Liters of Oxygen 6 L     2 Minute Oxygen Saturation % 93 %     2 Minute Liters of Oxygen 6 L     3 Minute Oxygen Saturation % 88 %     3 Minute Liters of Oxygen 6 L     4 Minute Oxygen Saturation % 84 %     4 Minute Liters of Oxygen 6 L  Increased to 8L     5 Minute Oxygen Saturation % 95 %     5 Minute Liters of Oxygen 8 L     6 Minute Oxygen Saturation % 92 %     6 Minute Liters of Oxygen 8 L     2 Minute Post Oxygen Saturation % 100 %     2 Minute Post Liters of Oxygen 6 L            Oxygen Initial Assessment:  Oxygen Initial Assessment - 08/16/20 1122      Home Oxygen    Home Oxygen Device Home Concentrator;Portable Concentrator    Sleep Oxygen Prescription Continuous;CPAP    Liters per minute 10    Home Exercise Oxygen Prescription Pulsed    Liters per minute 6    Home Resting Oxygen Prescription Pulsed    Liters per minute 4    Compliance with Home Oxygen Use Yes      Initial 6 min Walk   Oxygen Used Continuous    Liters per minute 6   Had to increase to 8L     Program Oxygen Prescription   Program Oxygen Prescription Continuous    Liters per minute 6    Comments Will need 8L if walking but can do 6L for seated exercise      Intervention   Short Term Goals To learn and exhibit compliance with exercise, home and travel O2 prescription;To learn and understand importance of monitoring SPO2 with pulse oximeter and demonstrate accurate use of the pulse oximeter.;To learn and understand importance of maintaining oxygen saturations>88%;To learn and demonstrate proper pursed lip breathing techniques or other breathing techniques.;To learn and demonstrate proper use of respiratory medications    Long  Term Goals Exhibits compliance with exercise, home and travel O2 prescription;Verbalizes importance of monitoring SPO2 with pulse oximeter and return demonstration;Maintenance of O2 saturations>88%;Exhibits proper breathing techniques, such as pursed lip breathing or other method taught during program session;Compliance with respiratory medication;Demonstrates proper use of MDI's           Oxygen Re-Evaluation:   Oxygen Discharge (Final Oxygen Re-Evaluation):   Initial Exercise Prescription:  Initial Exercise Prescription - 08/19/20 1400      Date of Initial Exercise RX and Referring Provider   Date 08/19/20    Referring Provider Dr. Harrietta Guardian    Expected Discharge Date 10/17/20      Oxygen   Oxygen Continuous    Liters 6-8      NuStep   Level 1    SPM 80    Minutes 15      Track   Minutes 15      Prescription Details   Frequency (times  per week) 2    Duration Progress to 30 minutes of continuous aerobic without signs/symptoms  of physical distress      Intensity   THRR 40-80% of Max Heartrate 66-133    Ratings of Perceived Exertion 11-13    Perceived Dyspnea 0-4      Progression   Progression Continue to progress workloads to maintain intensity without signs/symptoms of physical distress.      Resistance Training   Training Prescription Yes    Weight Red bands    Reps 10-15           Perform Capillary Blood Glucose checks as needed.  Exercise Prescription Changes:   Exercise Comments:   Exercise Goals and Review:  Exercise Goals    Row Name 08/16/20 1625             Exercise Goals   Increase Physical Activity Yes       Intervention Provide advice, education, support and counseling about physical activity/exercise needs.;Develop an individualized exercise prescription for aerobic and resistive training based on initial evaluation findings, risk stratification, comorbidities and participant's personal goals.       Expected Outcomes Short Term: Attend rehab on a regular basis to increase amount of physical activity.;Long Term: Add in home exercise to make exercise part of routine and to increase amount of physical activity.;Long Term: Exercising regularly at least 3-5 days a week.       Increase Strength and Stamina Yes       Intervention Provide advice, education, support and counseling about physical activity/exercise needs.;Develop an individualized exercise prescription for aerobic and resistive training based on initial evaluation findings, risk stratification, comorbidities and participant's personal goals.       Expected Outcomes Short Term: Increase workloads from initial exercise prescription for resistance, speed, and METs.;Short Term: Perform resistance training exercises routinely during rehab and add in resistance training at home;Long Term: Improve cardiorespiratory fitness, muscular endurance  and strength as measured by increased METs and functional capacity (6MWT)       Able to understand and use rate of perceived exertion (RPE) scale Yes       Intervention Provide education and explanation on how to use RPE scale       Expected Outcomes Short Term: Able to use RPE daily in rehab to express subjective intensity level;Long Term:  Able to use RPE to guide intensity level when exercising independently       Able to understand and use Dyspnea scale Yes       Intervention Provide education and explanation on how to use Dyspnea scale       Expected Outcomes Short Term: Able to use Dyspnea scale daily in rehab to express subjective sense of shortness of breath during exertion;Long Term: Able to use Dyspnea scale to guide intensity level when exercising independently       Knowledge and understanding of Target Heart Rate Range (THRR) Yes       Intervention Provide education and explanation of THRR including how the numbers were predicted and where they are located for reference       Expected Outcomes Short Term: Able to state/look up THRR;Long Term: Able to use THRR to govern intensity when exercising independently;Short Term: Able to use daily as guideline for intensity in rehab       Understanding of Exercise Prescription Yes       Intervention Provide education, explanation, and written materials on patient's individual exercise prescription       Expected Outcomes Short Term: Able to explain program exercise prescription;Long Term: Able to explain home exercise prescription to  exercise independently              Exercise Goals Re-Evaluation :   Discharge Exercise Prescription (Final Exercise Prescription Changes):   Nutrition:  Target Goals: Understanding of nutrition guidelines, daily intake of sodium <1557m, cholesterol <2014m calories 30% from fat and 7% or less from saturated fats, daily to have 5 or more servings of fruits and vegetables.  Biometrics:  Pre Biometrics -  08/16/20 1625      Pre Biometrics   Grip Strength 25.5 kg            Nutrition Therapy Plan and Nutrition Goals:   Nutrition Assessments:  MEDIFICTS Score Key:  ?70 Need to make dietary changes   40-70 Heart Healthy Diet  ? 40 Therapeutic Level Cholesterol Diet   Picture Your Plate Scores:  <4<96nhealthy dietary pattern with much room for improvement.  41-50 Dietary pattern unlikely to meet recommendations for good health and room for improvement.  51-60 More healthful dietary pattern, with some room for improvement.   >60 Healthy dietary pattern, although there may be some specific behaviors that could be improved.    Nutrition Goals Re-Evaluation:   Nutrition Goals Discharge (Final Nutrition Goals Re-Evaluation):   Psychosocial: Target Goals: Acknowledge presence or absence of significant depression and/or stress, maximize coping skills, provide positive support system. Participant is able to verbalize types and ability to use techniques and skills needed for reducing stress and depression.  Initial Review & Psychosocial Screening:  Initial Psych Review & Screening - 08/16/20 1126      Initial Review   Current issues with Current Depression;Current Sleep Concerns   States her depression is because of her illness and not being able to do things she enjoys. She is not currently on any treatment and said she would not be open to any depression medication     Family Dynamics   Good Support System? Yes   Daughters, family and friends     Barriers   Psychosocial barriers to participate in program The patient should benefit from training in stress management and relaxation.      Screening Interventions   Interventions Encouraged to exercise    Expected Outcomes Long Term goal: The participant improves quality of Life and PHQ9 Scores as seen by post scores and/or verbalization of changes;Long Term Goal: Stressors or current issues are controlled or eliminated.            Quality of Life Scores:  Scores of 19 and below usually indicate a poorer quality of life in these areas.  A difference of  2-3 points is a clinically meaningful difference.  A difference of 2-3 points in the total score of the Quality of Life Index has been associated with significant improvement in overall quality of life, self-image, physical symptoms, and general health in studies assessing change in quality of life.  PHQ-9: Recent Review Flowsheet Data    [facility] 08/16/2020 02/29/2020 10/05/2019 12/22/2018 12/30/2017   Decreased Interest 0 0 0 0 0   Down, Depressed, Hopeless 1 0 0 0 0   PHQ - 2 Score 1 0 0 0 0   Altered sleeping 0 0 - - -   Tired, decreased energy 2 0 - - -   Change in appetite 3 0 - - -   Feeling bad or failure about yourself  1 0 - - -   Trouble concentrating 0 0 - - -   Moving slowly or fidgety/restless 0 0 - - -  Suicidal thoughts 0 0 - - -   PHQ-9 Score - 0 - - -   Difficult doing work/chores Somewhat difficult Not difficult at all - - -     Interpretation of Total Score  Total Score Depression Severity:  1-4 = Minimal depression, 5-9 = Mild depression, 10-14 = Moderate depression, 15-19 = Moderately severe depression, 20-27 = Severe depression   Psychosocial Evaluation and Intervention:  Psychosocial Evaluation - 08/16/20 1628      Psychosocial Evaluation & Interventions   Comments Pt does show signs of depression and admits she has depression. She states the main reason for her depression is due to her illness and all of the things she is limited to doing because of her mental conditon and having to carrying around supplemental oxygen. She has only been on supplemental oxygen for over a year and had to get disability and can no longer work. She struggles with not being able to do the things she enjoys. Her depression is also affecting her sleep and states her sleep schedule is very off.    Expected Outcomes Improve patients quality  of life by decreasing shortness of breath with activities with hopes that pt will be able to do more. Also for pt to find healthy ways to relieve stress and to deal with depression, if she refuses treatment.    Continue Psychosocial Services  Follow up required by counselor           Psychosocial Re-Evaluation:   Psychosocial Discharge (Final Psychosocial Re-Evaluation):   Education: Education Goals: Education classes will be provided on a weekly basis, covering required topics. Participant will state understanding/return demonstration of topics presented.  Learning Barriers/Preferences:  Learning Barriers/Preferences - 08/16/20 1130      Learning Barriers/Preferences   Learning Barriers Sight   Wears glasses   Learning Preferences Written Material;Skilled Demonstration;Individual Instruction           Education Topics: Risk Factor Reduction:  -Group instruction that is supported by a PowerPoint presentation. Instructor discusses the definition of a risk factor, different risk factors for pulmonary disease, and how the heart and lungs work together.     Nutrition for Pulmonary Patient:  -Group instruction provided by PowerPoint slides, verbal discussion, and written materials to support subject matter. The instructor gives an explanation and review of healthy diet recommendations, which includes a discussion on weight management, recommendations for fruit and vegetable consumption, as well as protein, fluid, caffeine, fiber, sodium, sugar, and alcohol. Tips for eating when patients are short of breath are discussed.   Pursed Lip Breathing:  -Group instruction that is supported by demonstration and informational handouts. Instructor discusses the benefits of pursed lip and diaphragmatic breathing and detailed demonstration on how to preform both.     Oxygen Safety:  -Group instruction provided by PowerPoint, verbal discussion, and written material to support subject matter.  There is an overview of "What is Oxygen" and "Why do we need it".  Instructor also reviews how to create a safe environment for oxygen use, the importance of using oxygen as prescribed, and the risks of noncompliance. There is a brief discussion on traveling with oxygen and resources the patient may utilize.   Oxygen Equipment:  -Group instruction provided by [facility] Staff utilizing handouts, written materials, and equipment demonstrations.   Signs and Symptoms:  -Group instruction provided by written material and verbal discussion to support subject matter. Warning signs and symptoms of infection, stroke, and heart attack are reviewed and when to call  the physician/911 reinforced. Tips for preventing the spread of infection discussed.   Advanced Directives:  -Group instruction provided by verbal instruction and written material to support subject matter. Instructor reviews Advanced Directive laws and proper instruction for filling out document.   Pulmonary Video:  -Group video education that reviews the importance of medication and oxygen compliance, exercise, good nutrition, pulmonary hygiene, and pursed lip and diaphragmatic breathing for the pulmonary patient.   Exercise for the Pulmonary Patient:  -Group instruction that is supported by a PowerPoint presentation. Instructor discusses benefits of exercise, core components of exercise, frequency, duration, and intensity of an exercise routine, importance of utilizing pulse oximetry during exercise, safety while exercising, and options of places to exercise outside of rehab.     Pulmonary Medications:  -Verbally interactive group education provided by instructor with focus on inhaled medications and proper administration.   Anatomy and Physiology of the Respiratory System and Intimacy:  -Group instruction provided by PowerPoint, verbal discussion, and written material to support subject matter. Instructor reviews respiratory cycle  and anatomical components of the respiratory system and their functions. Instructor also reviews differences in obstructive and restrictive respiratory diseases with examples of each. Intimacy, Sex, and Sexuality differences are reviewed with a discussion on how relationships can change when diagnosed with pulmonary disease. Common sexual concerns are reviewed.   MD DAY -A group question and answer session with a medical doctor that allows participants to ask questions that relate to their pulmonary disease state.   OTHER EDUCATION -Group or individual verbal, written, or video instructions that support the educational goals of the pulmonary rehab program.   Holiday Eating Survival Tips:  -Group instruction provided by PowerPoint slides, verbal discussion, and written materials to support subject matter. The instructor gives patients tips, tricks, and techniques to help them not only survive but enjoy the holidays despite the onslaught of food that accompanies the holidays.   Knowledge Questionnaire Score:  Knowledge Questionnaire Score - 08/16/20 1641      Knowledge Questionnaire Score   Pre Score 12/18           Core Components/Risk Factors/Patient Goals at Admission:  Personal Goals and Risk Factors at Admission - 08/16/20 1632      Core Components/Risk Factors/Patient Goals on Admission    Weight Management Yes;Obesity    Intervention Weight Management: Develop a combined nutrition and exercise program designed to reach desired caloric intake, while maintaining appropriate intake of nutrient and fiber, sodium and fats, and appropriate energy expenditure required for the weight goal.;Weight Management: Provide education and appropriate resources to help participant work on and attain dietary goals.;Weight Management/Obesity: Establish reasonable short term and long term weight goals.    Improve shortness of breath with ADL's Yes    Intervention Provide education, individualized  exercise plan and daily activity instruction to help decrease symptoms of SOB with activities of daily living.    Expected Outcomes Short Term: Improve cardiorespiratory fitness to achieve a reduction of symptoms when performing ADLs;Long Term: Be able to perform more ADLs without symptoms or delay the onset of symptoms           Core Components/Risk Factors/Patient Goals Review:    Core Components/Risk Factors/Patient Goals at Discharge (Final Review):    ITP Comments:   Comments:

## 2020-08-22 ENCOUNTER — Encounter (HOSPITAL_COMMUNITY)
Admission: RE | Admit: 2020-08-22 | Discharge: 2020-08-22 | Disposition: A | Discharge status: Home / Self Care | Payer: 59 | Source: Ambulatory Visit | Attending: Cardiology | Admitting: Cardiology

## 2020-08-22 ENCOUNTER — Other Ambulatory Visit: Payer: Self-pay

## 2020-08-22 DIAGNOSIS — I2721 Secondary pulmonary arterial hypertension: Secondary | ICD-10-CM | POA: Diagnosis not present

## 2020-08-27 ENCOUNTER — Encounter (HOSPITAL_COMMUNITY)
Admission: RE | Admit: 2020-08-27 | Discharge: 2020-08-27 | Disposition: A | Discharge status: Home / Self Care | Payer: 59 | Source: Ambulatory Visit | Attending: Cardiology | Admitting: Cardiology

## 2020-08-27 ENCOUNTER — Other Ambulatory Visit: Payer: Self-pay

## 2020-08-27 VITALS — Wt 242.5 lb

## 2020-08-27 DIAGNOSIS — I2721 Secondary pulmonary arterial hypertension: Secondary | ICD-10-CM

## 2020-08-27 NOTE — Progress Notes (Signed)
[name] 55 y.o. female Nutrition Note  Diagnosis: Secondary Pulmonary Arterial Hypertension  Past Medical History:  Diagnosis Date  . Anemia    iron deficiency  . Arthritis   . Borderline diabetes   . Complication of anesthesia    woke up slowly- after hysterectomy- 2015  . COVID-19   . Diabetes mellitus, type II ([place])   . DVT (deep venous thrombosis) ([place]) 2014   left leg  . Family history of breast cancer   . Gout   . HTN (hypertension)   . 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 Menorrhagia    secondary to uterine fibroids  . OSA (obstructive sleep apnea)    07/25/13 HST AHI 33/hr, severe hypoxemia O2 min 42% and 95% of the time <89%  . PE (pulmonary embolism) 2014   bilateral  . Prediabetes   . Pulmonary artery hypertension ([place])   . Pulmonary nodule    (68m on loeft lower lobe) found on CT scan July 2014, repeat scan Jan 2015 showed less than 43m . Right ovarian cyst    noted 09/2012   . S/P TAH (total abdominal hysterectomy) 06/07/2013  . Trichomoniasis    05/2011      Medications reviewed.   Current Outpatient Medications:  .  acetaminophen (TYLENOL) 500 MG tablet, Take 1,000 mg by mouth every 6 (six) hours as needed for moderate pain or headache. , Disp: , Rfl:  .  albuterol (VENTOLIN HFA) 108 (90 Base) MCG/ACT inhaler, Inhale 1-2 puffs into the lungs every 6 (six) hours as needed for wheezing or shortness of breath., Disp: 18 g, Rfl: 11 .  allopurinol (ZYLOPRIM) 300 MG tablet, TAKE 1 TABLET (300 MG TOTAL) BY MOUTH DAILY. APPT FURTHER REFILLS AFTER 6 MONTHS CALL TO SCHEDULE NOW PLEASE, Disp: 90 tablet, Rfl: 1 .  anastrozole (ARIMIDEX) 1 MG tablet, Take 1 tablet (1 mg total) by mouth daily., Disp: 90 tablet, Rfl: 3 .  azelastine (ASTELIN) 0.1 % nasal spray, Place 1 spray into both nostrils 2 (two) times daily. Use in each nostril as directed, Disp: 30 mL, Rfl: 12 .  Cholecalciferol 1.25 MG (50000 UT) capsule, Take 1  capsule (50,000 Units total) by mouth once a week. d3, Disp: 13 capsule, Rfl: 1 .  Colchicine (MITIGARE) 0.6 MG CAPS, Take 1 capsule by mouth as needed. As needed gout flare max # of pills 2 in 24 hours, Disp: 30 capsule, Rfl: 11 .  fluticasone (FLONASE) 50 MCG/ACT nasal spray, Place into both nostrils daily as needed for allergies or rhinitis., Disp: , Rfl:  .  Fluticasone-Salmeterol (ADVAIR) 250-50 MCG/DOSE AEPB, Inhale 1 puff into the lungs 2 (two) times daily as needed. Rinse mouth, Disp: 60 each, Rfl: 12 .  ibuprofen (ADVIL) 800 MG tablet, Take 1 tablet (800 mg total) by mouth every 8 (eight) hours as needed., Disp: 90 tablet, Rfl: 2 .  ipratropium-albuterol (DUONEB) 0.5-2.5 (3) MG/3ML SOLN, Take 3 mLs by nebulization every 4 (four) hours as needed., Disp: 360 mL, Rfl: 12 .  loratadine (CLARITIN) 10 MG tablet, Take 10 mg by mouth daily., Disp: , Rfl:  .  Magnesium Cl-Calcium Carbonate (SLOW MAGNESIUM/CALCIUM PO), Take by mouth., Disp: , Rfl:  .  metoprolol tartrate (LOPRESSOR) 25 MG tablet, Take 1 tablet (25 mg total) by mouth 2 (two) times daily., Disp: 180 tablet, Rfl: 3 .  Multiple Vitamin (MULTIVITAMIN) tablet, Take 1 tablet by mouth daily., Disp: , Rfl:  .  Nirmatrelvir &  Ritonavir (PAXLOVID) 20 x 150 MG & 10 x 100MG TBPK, See package instructions., Disp: , Rfl:  .  OPSUMIT 10 MG TABS, Take 10 mg by mouth daily., Disp: , Rfl: 8 .  Polyethyl Glycol-Propyl Glycol (SYSTANE OP), Place 1 drop into both eyes daily as needed (dry eyes). , Disp: , Rfl:  .  rivaroxaban (XARELTO) 20 MG TABS tablet, Take 20 mg by mouth daily with supper., Disp: , Rfl:  .  sildenafil (REVATIO) 20 MG tablet, Take 1-2 tablets (20-40 mg total) by mouth 3 (three) times daily. As of 07/03/20 taking 20 mg tid per [facility] pulm Dr. Harrietta Guardian, Disp: 10 tablet, Rfl: 0 .  spironolactone (ALDACTONE) 25 MG tablet, Take 25 mg by mouth daily., Disp: , Rfl:  .  torsemide (DEMADEX) 100 MG tablet, Take 100 mg by mouth daily. , Disp: , Rfl:  .   treprostinil (REMODULIN) 20 MG/20ML injection, 20 ng/kg/min by Continuous infusion (non-IV) route continuous., Disp: , Rfl:  .  triamcinolone cream (KENALOG) 0.1 %, Apply 1 application topically daily as needed (leg discoloration)., Disp: 454 g, Rfl: 2 No current facility-administered medications for this encounter.  Facility-Administered Medications Ordered in Other Encounters:  .  heparin lock flush 100 unit/mL, 500 Units, Intracatheter, Once PRN, Nicholas Lose, MD .  sodium chloride flush (NS) 0.9 % injection 10 mL, 10 mL, Intracatheter, PRN, Nicholas Lose, MD   Ht Readings from Last 1 Encounters:  08/16/20 _0  (1.651 m)     Wt Readings from Last 3 Encounters:  08/16/20 238 lb 8.6 oz (108.2 kg)  07/03/20 237 lb (107.5 kg)  04/03/20 233 lb (105.7 kg)     There is no height or weight on file to calculate BMI.   Social History   Tobacco Use  Smoking Status Former Smoker  . Packs/day: 0.33  . Years: 10.00  . Pack years: 3.30  . Types: Cigarettes  . Quit date: 10/04/2011  . Years since quitting: 8.9  Smokeless Tobacco Never Used     Lab Results  Component Value Date   CHOL 190 03/05/2020   Lab Results  Component Value Date   HDL 52 03/05/2020   Lab Results  Component Value Date   LDLCALC 119 (H) 03/05/2020   Lab Results  Component Value Date   TRIG 105 03/05/2020     Lab Results  Component Value Date   HGBA1C 5.5 03/05/2020     CBG (last 3)  No results for input(s): GLUCAP in the last 72 hours.   Nutrition Note  Spoke with pt. Nutrition Plan and Nutrition Survey goals reviewed with pt. Pt is trying to follow a healthy diet but has a poor appetite/has a hard time choosing healthy meals. She often skips breakfast and lunch and only eats dinner. She has had a poor appetite since chemo 4 yrs ago.  Pt reports gaining weight - about 15-20 lbs over the past 6 months. She wants to lose weight. She feels eating regularly and with a meal plan is helpful for  her.   Pt with dx of pulmonary hypertension. Per MD notes, she is on a 2 L fluid restriction and 2 g sodium restriction. Per discussion, pt does use canned/convenience foods often. Pt does add salt to food. Pt does not eat out frequently. Pt does not read labels all the time. She checks labels for sodium and fat occasionally. She was under the impression that she needs to increase sodium consumption as BP runs low. However, she does try  to avoid really salty flavors/foods as she prefers seasoning foods with herbs. Reviewed sodium and pulmonary hypertension. Provided ways to review labels to reduce daily intake of sodium. We also reviewed ways to limit fluid and to avoid discomfort of dry mouth.   Pt expressed understanding of the information reviewed.   Nutrition Diagnosis ? Excessive sodium intake related to over consumption of processed food as evidenced by frequent consumption of convenience food/ canned vegetables, salt shaker use, and seasoning use.  Nutrition Intervention ? Pt's individual nutrition plan reviewed with pt. ? 2 g sodium and 2 L fluid  ? Continue client-centered nutrition education by RD, as part of interdisciplinary care.  Goal(s) ? Pt to read labels to identify high sodium foods and reduce intake ? Pt to limit fluids by choosing popsicles, hard candy, and lemon water to reduce dry mouth discomfort ? Pt to identify food quantities necessary to achieve weight loss of 6-24 lb at graduation from cardiac rehab.   Plan:   Will provide client-centered nutrition education as part of interdisciplinary care  Monitor and evaluate progress toward nutrition goal with team.   Michaele Offer, MS, RDN, LDN

## 2020-08-27 NOTE — Progress Notes (Signed)
Daily Session Note  Patient Details  Name: [name] MRN: [id number] Date of Birth: [birth year] Referring Provider:   [name] Pulmonary Rehab Walk Test from 08/16/2020 in [place]  Referring Provider Dr. Harrietta Guardian      Encounter Date: 08/27/2020  Check In:  Session Check In - 08/27/20 1119      Check-In   Supervising physician immediately available to respond to emergencies [facility] immediately available    Physician(s) Dr. Myna Hidalgo    Location [facility]    Staff Present Rosebud Poles, RN, Isaac Laud, MS, ACSM-CEP, Exercise Physiologist;Carlette Wilber Oliphant, RN, BSN;Ramon Dredge, RN, MHA    Virtual Visit No    Medication changes reported     No    Fall or balance concerns reported    No    Tobacco Cessation No Change    Warm-up and Cool-down Performed as group-led instruction    Resistance Training Performed Yes    VAD Patient? No    PAD/SET Patient? No      Pain Assessment   Currently in Pain? No/denies    Multiple Pain Sites No           Capillary Blood Glucose: No results found for this or any previous visit (from the past 24 hour(s)).   Exercise Prescription Changes - 08/27/20 1100      Response to Exercise   Blood Pressure (Admit) 136/78    Blood Pressure (Exercise) 140/66    Blood Pressure (Exit) 116/78    Heart Rate (Admit) 118 bpm    Heart Rate (Exercise) 93 bpm    Heart Rate (Exit) 83 bpm    Oxygen Saturation (Admit) 98 %    Oxygen Saturation (Exercise) 98 %    Oxygen Saturation (Exit) 100 %    Rating of Perceived Exertion (Exercise) 11    Perceived Dyspnea (Exercise) 1    Duration Continue with 30 min of aerobic exercise without signs/symptoms of physical distress.    Intensity Other (comment)   40-80% HR Max     Progression   Progression Continue to progress workloads to maintain intensity without signs/symptoms of physical distress.      Resistance Training   Training  Prescription Yes    Weight Red bands    Reps 10-15    Time 10 Minutes      Oxygen   Oxygen Continuous    Liters 6      NuStep   Level 2    SPM 80    Minutes 30    METs 1.6           Social History   Tobacco Use  Smoking Status Former Smoker  . Packs/day: 0.33  . Years: 10.00  . Pack years: 3.30  . Types: Cigarettes  . Quit date: 10/04/2011  . Years since quitting: 8.9  Smokeless Tobacco Never Used    Goals Met:  Proper associated with RPD/PD & O2 Sat Exercise tolerated well No report of cardiac concerns or symptoms Strength training completed today  Goals Unmet:  Not Applicable  Comments: Service time is from 1025 to 1126    Dr. Fransico Him is Medical Director for Cardiac Rehab at [facility]

## 2020-08-29 ENCOUNTER — Other Ambulatory Visit: Payer: Self-pay

## 2020-08-29 ENCOUNTER — Encounter (HOSPITAL_COMMUNITY)
Admission: RE | Admit: 2020-08-29 | Discharge: 2020-08-29 | Disposition: A | Discharge status: Home / Self Care | Payer: 59 | Source: Ambulatory Visit | Attending: Cardiology | Admitting: Cardiology

## 2020-08-29 DIAGNOSIS — I2721 Secondary pulmonary arterial hypertension: Secondary | ICD-10-CM | POA: Diagnosis not present

## 2020-08-29 NOTE — Progress Notes (Signed)
Daily Session Note  Patient Details  Name: [name] MRN: [id number] Date of Birth: 1965-09-10 Referring Provider:   [name] Pulmonary Rehab Walk Test from 08/16/2020 in [place]  Referring Provider Dr. Harrietta Guardian      Encounter Date: 08/29/2020  Check In:  Session Check In - 08/29/20 1157      Check-In   Supervising physician immediately available to respond to emergencies [facility] immediately available    Physician(s) Dr. Tana Coast    [facility]    Staff Present Leda Roys, MS, ACSM-CEP, Exercise Physiologist;Annedrea Rosezella [place], RN, MHA;Carlette Wilber Oliphant, RN, BSN    Virtual Visit No    Medication changes reported     No    Fall or balance concerns reported    No    Tobacco Cessation No Change    Warm-up and Cool-down Performed as group-led Higher education careers adviser Performed Yes    VAD Patient? No    PAD/SET Patient? No      Pain Assessment   Currently in Pain? No/denies    Pain Score 0-No pain    Multiple Pain Sites No           Capillary Blood Glucose: No results found for this or any previous visit (from the past 24 hour(s)).    Social History   Tobacco Use  Smoking Status Former Smoker  . Packs/day: 0.33  . Years: 10.00  . Pack years: 3.30  . Types: Cigarettes  . Quit date: 10/04/2011  . Years since quitting: 8.9  Smokeless Tobacco Never Used    Goals Met:  Proper associated with RPD/PD & O2 Sat Independence with exercise equipment Exercise tolerated well No report of cardiac concerns or symptoms Strength training completed today  Goals Unmet:  Not Applicable  Comments: Service time is from 1015 to 1130    Dr. Fransico Him is Medical Director for Cardiac Rehab at [facility].

## 2020-09-03 ENCOUNTER — Other Ambulatory Visit: Payer: Self-pay

## 2020-09-03 ENCOUNTER — Encounter (HOSPITAL_COMMUNITY)
Admission: RE | Admit: 2020-09-03 | Discharge: 2020-09-03 | Disposition: A | Discharge status: Home / Self Care | Payer: 59 | Source: Ambulatory Visit | Attending: Cardiology | Admitting: Cardiology

## 2020-09-03 DIAGNOSIS — I2721 Secondary pulmonary arterial hypertension: Secondary | ICD-10-CM | POA: Diagnosis not present

## 2020-09-03 NOTE — Progress Notes (Signed)
Daily Session Note  Patient Details  Name: [name] MRN: [id number] Date of Birth: 17-Feb-1966 Referring Provider:   [name] Pulmonary Rehab Walk Test from 08/16/2020 in [place]  Referring Provider Dr. Harrietta Guardian      Encounter Date: 09/03/2020  Check In:  Session Check In - 09/03/20 1143      Check-In   Supervising physician immediately available to respond to emergencies [facility] immediately available    Physician(s) Dr. Tana Coast    [facility]    Staff Present Leda Roys, MS, ACSM-CEP, Exercise Physiologist;Lisa Ysidro Evert, RN;Other;David Lilyan Punt, MS, ACSM-CEP, CCRP, Exercise Physiologist    Virtual Visit No    Medication changes reported     No    Fall or balance concerns reported    No    Tobacco Cessation No Change    Warm-up and Cool-down Performed as group-led instruction    Resistance Training Performed Yes    VAD Patient? No      Pain Assessment   Currently in Pain? No/denies    Multiple Pain Sites No           Capillary Blood Glucose: No results found for this or any previous visit (from the past 24 hour(s)).    Social History   Tobacco Use  Smoking Status Former Smoker  . Packs/day: 0.33  . Years: 10.00  . Pack years: 3.30  . Types: Cigarettes  . Quit date: 10/04/2011  . Years since quitting: 8.9  Smokeless Tobacco Never Used    Goals Met:  Proper associated with RPD/PD & O2 Sat Independence with exercise equipment Exercise tolerated well No report of cardiac concerns or symptoms Strength training completed today  Goals Unmet:  Not Applicable  Comments: Service time is from 1015 to 1128   Dr. Fransico Him is Medical Director for Cardiac Rehab at [facility].

## 2020-09-05 ENCOUNTER — Other Ambulatory Visit: Payer: Self-pay

## 2020-09-05 ENCOUNTER — Encounter (HOSPITAL_COMMUNITY)
Admission: RE | Admit: 2020-09-05 | Discharge: 2020-09-05 | Disposition: A | Discharge status: Home / Self Care | Payer: 59 | Source: Ambulatory Visit | Attending: Cardiology | Admitting: Cardiology

## 2020-09-05 DIAGNOSIS — I2721 Secondary pulmonary arterial hypertension: Secondary | ICD-10-CM | POA: Diagnosis not present

## 2020-09-05 NOTE — Progress Notes (Addendum)
Daily Session Note  Patient Details  Name: [name] MRN: [id number] Date of Birth: 20-Jul-1965 Referring Provider:   [name] Pulmonary Rehab Walk Test from 08/16/2020 in [place]  Referring Provider Dr. Harrietta Guardian       Encounter Date: 09/05/2020  Check In:  Session Check In - 09/05/20 1117       Check-In   Supervising physician immediately available to respond to emergencies [facility] immediately available    Physician(s) Dr. Lonny Prude    [facility]    Staff Present Rodney Langton, RN;Other;Jessica Hassell Done, MS, ACSM-CEP, Exercise Physiologist    Virtual Visit No    Medication changes reported     No    Fall or balance concerns reported    No    Tobacco Cessation No Change    Warm-up and Cool-down Performed as group-led instruction    Resistance Training Performed Yes    VAD Patient? No    PAD/SET Patient? No      Pain Assessment   Currently in Pain? No/denies    Pain Score 0-No pain    Multiple Pain Sites No             Capillary Blood Glucose: No results found for this or any previous visit (from the past 24 hour(s)).    Social History   Tobacco Use  Smoking Status Former   Packs/day: 0.33   Years: 10.00   Pack years: 3.30   Types: Cigarettes   Quit date: 10/04/2011   Years since quitting: 8.9  Smokeless Tobacco Never    Goals Met:  Exercise tolerated well No report of cardiac concerns or symptoms Strength training completed today  Goals Unmet:  Not Applicable  Comments: Service time is from 1015 to 1130     Dr. Fransico Him is Medical Director for Cardiac Rehab at [facility].

## 2020-09-05 NOTE — Progress Notes (Signed)
Nutrition Note  Spoke with pt. Reviewed food log and sodium intake.  Daily intake of high sodium foods. We reviewed labeled reading and reducing sodium while cooking and choosing less processed food options. Collaborated with pt for further short term goal setting. Pt to keep food log x1 week and read labels and measure and decrease salt while cooking.  Nutrition Diagnosis  Excessive sodium intake related to over consumption of processed food as evidenced by frequent consumption of convenience food/ canned vegetables, salt shaker use, and seasoning use.   Nutrition Intervention  Pt's individual nutrition plan reviewed with pt. 2 g sodium and 2 L fluid  Continue client-centered nutrition education by RD, as part of interdisciplinary care.   Goal(s) Pt to read labels to identify high sodium foods and reduce intake Pt to limit fluids by choosing popsicles, hard candy, and lemon water to reduce dry mouth discomfort Pt to identify food quantities necessary to achieve weight loss of 6-24 lb at graduation from cardiac rehab.   Plan:  Will provide client-centered nutrition education as part of interdisciplinary care Monitor and evaluate progress toward nutrition goal with team.     Michaele Offer, MS, RDN, LDN

## 2020-09-10 ENCOUNTER — Other Ambulatory Visit: Payer: Self-pay

## 2020-09-10 ENCOUNTER — Encounter (HOSPITAL_COMMUNITY)
Admission: RE | Admit: 2020-09-10 | Discharge: 2020-09-10 | Disposition: A | Discharge status: Home / Self Care | Payer: 59 | Source: Ambulatory Visit | Attending: Cardiology | Admitting: Cardiology

## 2020-09-10 VITALS — Wt 236.8 lb

## 2020-09-10 DIAGNOSIS — I2721 Secondary pulmonary arterial hypertension: Secondary | ICD-10-CM | POA: Diagnosis not present

## 2020-09-10 NOTE — Progress Notes (Signed)
Daily Session Note  Patient Details  Name: [name] MRN: [id number] Date of Birth: [birth year] Referring Provider:   [name] Pulmonary Rehab Walk Test from 08/16/2020 in [place]  Referring Provider Dr. Harrietta Guardian       Encounter Date: 09/10/2020  Check In:  Session Check In - 09/10/20 1152       Check-In   Supervising physician immediately available to respond to emergencies [facility] immediately available    Physician(s) Dr. Lonny Prude    [facility]    Staff Present Rosebud Poles, RN, BSN;Other;Renalda Locklin Ysidro Evert, RN;Jessica Hassell Done, MS, ACSM-CEP, Exercise Physiologist    Virtual Visit No    Medication changes reported     No    Fall or balance concerns reported    No    Tobacco Cessation No Change    Warm-up and Cool-down Performed as group-led instruction    Resistance Training Performed Yes    VAD Patient? No    PAD/SET Patient? No      Pain Assessment   Currently in Pain? No/denies    Multiple Pain Sites No             Capillary Blood Glucose: No results found for this or any previous visit (from the past 24 hour(s)).   Exercise Prescription Changes - 09/10/20 1200       Response to Exercise   Blood Pressure (Admit) 116/82    Blood Pressure (Exercise) 124/70    Blood Pressure (Exit) 100/64    Heart Rate (Admit) 96 bpm    Heart Rate (Exercise) 91 bpm    Heart Rate (Exit) 100 bpm    Oxygen Saturation (Admit) 99 %    Oxygen Saturation (Exercise) 98 %    Oxygen Saturation (Exit) 100 %    Rating of Perceived Exertion (Exercise) 13    Perceived Dyspnea (Exercise) 1    Duration Continue with 30 min of aerobic exercise without signs/symptoms of physical distress.    Intensity THRR unchanged      Progression   Progression Continue to progress workloads to maintain intensity without signs/symptoms of physical distress.      Resistance Training   Training Prescription Yes    Weight Red bands     Reps 10-15    Time 10 Minutes      Oxygen   Oxygen Continuous    Liters 6-8      Bike   Level 1    Minutes 15    METs 1.4      NuStep   Level 3    SPM 80    Minutes 15    METs 1.6             Social History   Tobacco Use  Smoking Status Former   Packs/day: 0.33   Years: 10.00   Pack years: 3.30   Types: Cigarettes   Quit date: 10/04/2011   Years since quitting: 8.9  Smokeless Tobacco Never    Goals Met:  Exercise tolerated well No report of cardiac concerns or symptoms Strength training completed today  Goals Unmet:  Not Applicable  Comments: Service time is from 1015 to 1130    Dr. Fransico Him is Medical Director for Cardiac Rehab at [facility].

## 2020-09-12 ENCOUNTER — Other Ambulatory Visit: Payer: Self-pay

## 2020-09-12 ENCOUNTER — Encounter (HOSPITAL_COMMUNITY)
Admission: RE | Admit: 2020-09-12 | Discharge: 2020-09-12 | Disposition: A | Discharge status: Home / Self Care | Payer: 59 | Source: Ambulatory Visit | Attending: Cardiology | Admitting: Cardiology

## 2020-09-12 DIAGNOSIS — I2721 Secondary pulmonary arterial hypertension: Secondary | ICD-10-CM

## 2020-09-12 NOTE — Progress Notes (Signed)
Daily Session Note  Patient Details  Name: [name] MRN: [id number] Date of Birth: [birth year] Referring Provider:   [name] Pulmonary Rehab Walk Test from 08/16/2020 in [place]  Referring Provider Dr. Harrietta Guardian       Encounter Date: 09/12/2020  Check In:  Session Check In - 09/12/20 1111       Check-In   Supervising physician immediately available to respond to emergencies [facility] immediately available    Physician(s) Dr. Arbutus Ped    [facility]    Staff Present Rosebud Poles, RN, BSN;Lisa Ysidro Evert, RN;Other;Carlette Wilber Oliphant, RN, BSN;Ramon Dredge, RN, MHA    Virtual Visit No    Medication changes reported     No    Fall or balance concerns reported    No    Tobacco Cessation No Change    Warm-up and Cool-down Performed as group-led instruction    Resistance Training Performed Yes    VAD Patient? No    PAD/SET Patient? No      Pain Assessment   Currently in Pain? No/denies    Multiple Pain Sites No             Capillary Blood Glucose: No results found for this or any previous visit (from the past 24 hour(s)).    Social History   Tobacco Use  Smoking Status Former   Packs/day: 0.33   Years: 10.00   Pack years: 3.30   Types: Cigarettes   Quit date: 10/04/2011   Years since quitting: 8.9  Smokeless Tobacco Never    Goals Met:  Exercise tolerated well No report of cardiac concerns or symptoms Strength training completed today  Goals Unmet:  Not Applicable  Comments: Service time is from 1015 to 1130    Dr. Fransico Him is Medical Director for Cardiac Rehab at [facility].

## 2020-09-17 ENCOUNTER — Other Ambulatory Visit: Payer: Self-pay

## 2020-09-17 ENCOUNTER — Encounter (HOSPITAL_COMMUNITY)
Admission: RE | Admit: 2020-09-17 | Discharge: 2020-09-17 | Disposition: A | Discharge status: Home / Self Care | Payer: 59 | Source: Ambulatory Visit | Attending: Cardiology | Admitting: Cardiology

## 2020-09-17 DIAGNOSIS — I2721 Secondary pulmonary arterial hypertension: Secondary | ICD-10-CM | POA: Diagnosis not present

## 2020-09-17 NOTE — Progress Notes (Signed)
Daily Session Note  Patient Details  Name: [name] MRN: [id number] Date of Birth: October 26, 1965 Referring Provider:   [name] Pulmonary Rehab Walk Test from 08/16/2020 in [place]  Referring Provider Dr. Harrietta Guardian       Encounter Date: 09/17/2020  Check In:  Session Check In - 09/17/20 1156       Check-In   Supervising physician immediately available to respond to emergencies [facility] immediately available    Physician(s) Dr. Arbutus Ped    [facility]    Staff Present Rosebud Poles, RN, BSN;Robb Sibal Ysidro Evert, RN;Olinty Celesta Aver, MS, ACSM CEP, Exercise Physiologist;Carlette Wilber Oliphant, RN, BSN;Ramon Dredge, RN, MHA;Jessica Hassell Done, MS, ACSM-CEP, Exercise Physiologist;Meredith Rosana Hoes RD, LDN    Virtual Visit No    Medication changes reported     No    Fall or balance concerns reported    No    Tobacco Cessation No Change    Warm-up and Cool-down Performed as group-led instruction    Resistance Training Performed Yes    VAD Patient? No    PAD/SET Patient? No      Pain Assessment   Currently in Pain? No/denies    Pain Score 0-No pain    Multiple Pain Sites No             Capillary Blood Glucose: No results found for this or any previous visit (from the past 24 hour(s)).    Social History   Tobacco Use  Smoking Status Former   Packs/day: 0.33   Years: 10.00   Pack years: 3.30   Types: Cigarettes   Quit date: 10/04/2011   Years since quitting: 8.9  Smokeless Tobacco Never    Goals Met:  Exercise tolerated well No report of cardiac concerns or symptoms Strength training completed today  Goals Unmet:  Not Applicable  Comments: Service time is from 1015 to 1145    Dr. Fransico Him is Medical Director for Cardiac Rehab at [facility].

## 2020-09-17 NOTE — Progress Notes (Signed)
Pulmonary Individual Treatment Plan  Patient Details  Name: [name] MRN: [id number] Date of Birth: 1965/08/31 Referring Provider:   [name] Pulmonary Rehab Walk Test from 08/16/2020 in [place]  Referring Provider Dr. Harrietta Guardian       Initial Encounter Date:  Flowsheet Row Pulmonary Rehab Walk Test from 08/16/2020 in [facility]  Date 08/19/20       Visit Diagnosis: Secondary pulmonary arterial hypertension ([place])  Patient's Home Medications on Admission:   Current Outpatient Medications:    acetaminophen (TYLENOL) 500 MG tablet, Take 1,000 mg by mouth every 6 (six) hours as needed for moderate pain or headache. , Disp: , Rfl:    albuterol (VENTOLIN HFA) 108 (90 Base) MCG/ACT inhaler, Inhale 1-2 puffs into the lungs every 6 (six) hours as needed for wheezing or shortness of breath., Disp: 18 g, Rfl: 11   allopurinol (ZYLOPRIM) 300 MG tablet, TAKE 1 TABLET (300 MG TOTAL) BY MOUTH DAILY. APPT FURTHER REFILLS AFTER 6 MONTHS CALL TO SCHEDULE NOW PLEASE, Disp: 90 tablet, Rfl: 1   anastrozole (ARIMIDEX) 1 MG tablet, Take 1 tablet (1 mg total) by mouth daily., Disp: 90 tablet, Rfl: 3   azelastine (ASTELIN) 0.1 % nasal spray, Place 1 spray into both nostrils 2 (two) times daily. Use in each nostril as directed, Disp: 30 mL, Rfl: 12   Cholecalciferol 1.25 MG (50000 UT) capsule, Take 1 capsule (50,000 Units total) by mouth once a week. d3, Disp: 13 capsule, Rfl: 1   Colchicine (MITIGARE) 0.6 MG CAPS, Take 1 capsule by mouth as needed. As needed gout flare max # of pills 2 in 24 hours, Disp: 30 capsule, Rfl: 11   fluticasone (FLONASE) 50 MCG/ACT nasal spray, Place into both nostrils daily as needed for allergies or rhinitis., Disp: , Rfl:    Fluticasone-Salmeterol (ADVAIR) 250-50 MCG/DOSE AEPB, Inhale 1 puff into the lungs 2 (two) times daily as needed. Rinse mouth, Disp: 60 each, Rfl: 12   ibuprofen (ADVIL) 800 MG tablet,  Take 1 tablet (800 mg total) by mouth every 8 (eight) hours as needed., Disp: 90 tablet, Rfl: 2   ipratropium-albuterol (DUONEB) 0.5-2.5 (3) MG/3ML SOLN, Take 3 mLs by nebulization every 4 (four) hours as needed., Disp: 360 mL, Rfl: 12   loratadine (CLARITIN) 10 MG tablet, Take 10 mg by mouth daily., Disp: , Rfl:    Magnesium Cl-Calcium Carbonate (SLOW MAGNESIUM/CALCIUM PO), Take by mouth., Disp: , Rfl:    metoprolol tartrate (LOPRESSOR) 25 MG tablet, Take 1 tablet (25 mg total) by mouth 2 (two) times daily., Disp: 180 tablet, Rfl: 3   Multiple Vitamin (MULTIVITAMIN) tablet, Take 1 tablet by mouth daily., Disp: , Rfl:    Nirmatrelvir & Ritonavir (PAXLOVID) 20 x 150 MG & 10 x 100MG TBPK, See package instructions., Disp: , Rfl:    OPSUMIT 10 MG TABS, Take 10 mg by mouth daily., Disp: , Rfl: 8   Polyethyl Glycol-Propyl Glycol (SYSTANE OP), Place 1 drop into both eyes daily as needed (dry eyes). , Disp: , Rfl:    rivaroxaban (XARELTO) 20 MG TABS tablet, Take 20 mg by mouth daily with supper., Disp: , Rfl:    sildenafil (REVATIO) 20 MG tablet, Take 1-2 tablets (20-40 mg total) by mouth 3 (three) times daily. As of 07/03/20 taking 20 mg tid per [facility] Dr. Harrietta Guardian, Disp: 10 tablet, Rfl: 0   spironolactone (ALDACTONE) 25 MG tablet, Take 25 mg by mouth daily., Disp: , Rfl:  torsemide (DEMADEX) 100 MG tablet, Take 100 mg by mouth daily. , Disp: , Rfl:    treprostinil (REMODULIN) 20 MG/20ML injection, 20 ng/kg/min by Continuous infusion (non-IV) route continuous., Disp: , Rfl:    triamcinolone cream (KENALOG) 0.1 %, Apply 1 application topically daily as needed (leg discoloration)., Disp: 454 g, Rfl: 2 No current facility-administered medications for this encounter.  Facility-Administered Medications Ordered in Other Encounters:    heparin lock flush 100 unit/mL, 500 Units, Intracatheter, Once PRN, Nicholas Lose, MD   sodium chloride flush (NS) 0.9 % injection 10 mL, 10 mL, Intracatheter, PRN, Nicholas Lose, MD  Past Medical History: Past Medical History:  Diagnosis Date   Anemia    iron deficiency   Arthritis    Borderline diabetes    Complication of anesthesia    woke up slowly- after hysterectomy- 2015   COVID-19    Diabetes mellitus, type II ([place])    DVT (deep venous thrombosis) ([place]) 2014   left leg   Family history of breast cancer    Gout    HTN (hypertension)    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   Menorrhagia    secondary to uterine fibroids   OSA (obstructive sleep apnea)    07/25/13 HST AHI 33/hr, severe hypoxemia O2 min 42% and 95% of the time <89%   PE (pulmonary embolism) 2014   bilateral   Prediabetes    Pulmonary artery hypertension ([place])    Pulmonary nodule    (26m on loeft lower lobe) found on CT scan July 2014, repeat scan Jan 2015 showed less than 419m  Right ovarian cyst    noted 09/2012    S/P TAH (total abdominal hysterectomy) 06/07/2013   Trichomoniasis    05/2011     Tobacco Use: Social History   Tobacco Use  Smoking Status Former   Packs/day: 0.33   Years: 10.00   Pack years: 3.30   Types: Cigarettes   Quit date: 10/04/2011   Years since quitting: 8.9  Smokeless Tobacco Never    Labs: Recent Review Flowsheet Data     Labs for ITP Cardiac and Pulmonary Rehab Latest Ref Rng & Units 06/22/2017 12/30/2017 12/22/2018 10/05/2019 03/05/2020   Cholestrol 100 - 199 mg/dL 148 - 119 - 190   LDLCALC 0 - 99 mg/dL 86 - 69 - 119(H)   HDL >39 mg/dL 36(L) - 30.00(L) - 52   Trlycerides 0 - 149 mg/dL 128 - 99.0 - 105   Hemoglobin A1c 4.8 - 5.6 % 6.3(H) 6.2 6.4 5.0 5.5   HCO3 20.0 - 28.0 mmol/L - - - - -   TCO2 0 - 100 mmol/L - - - - -   O2SAT % - - - - -       Capillary Blood Glucose: Lab Results  Component Value Date   GLUCAP 119 (H) 09/09/2017   GLUCAP 113 (H) 09/09/2017   GLUCAP 95 09/02/2017   GLUCAP 108 (H) 07/27/2016   GLUCAP 116 (H) 07/27/2016     Pulmonary Assessment Scores:  Pulmonary  Assessment Scores     Row Name 08/16/20 1627         ADL UCSD   ADL Phase Exit     SOB Score total 74           CAT Score     CAT Score 26           mMRC Score     mMRC Score 4  UCSD: Self-administered rating of dyspnea associated with activities of daily living (ADLs) 6-point scale (0 = "not at all" to 5 = "maximal or unable to do because of breathlessness")  Scoring Scores range from 0 to 120.  Minimally important difference is 5 units  CAT: CAT can identify the health impairment of COPD patients and is better correlated with disease progression.  CAT has a scoring range of zero to 40. The CAT score is classified into four groups of low (less than 10), medium (10 - 20), high (21-30) and very high (31-40) based on the impact level of disease on health status. A CAT score over 10 suggests significant symptoms.  A worsening CAT score could be explained by an exacerbation, poor medication adherence, poor inhaler technique, or progression of COPD or comorbid conditions.  CAT MCID is 2 points  mMRC: mMRC (Modified Medical Research Council) Dyspnea Scale is used to assess the degree of baseline functional disability in patients of respiratory disease due to dyspnea. No minimal important difference is established. A decrease in score of 1 point or greater is considered a positive change.   Pulmonary Function Assessment:  Pulmonary Function Assessment - 08/16/20 1125       Breath   Shortness of Breath Yes;Limiting activity             Exercise Target Goals: Exercise Program Goal: Individual exercise prescription set using results from initial 6 min walk test and THRR while considering  patient's activity barriers and safety.   Exercise Prescription Goal: Initial exercise prescription builds to 30-45 minutes a day of aerobic activity, 2-3 days per week.  Home exercise guidelines will be given to patient during program as part of exercise prescription that the  participant will acknowledge.  Activity Barriers & Risk Stratification:  Activity Barriers & Cardiac Risk Stratification - 08/16/20 1121       Activity Barriers & Cardiac Risk Stratification   Activity Barriers Shortness of Breath;Deconditioning             6 Minute Walk:  6 Minute Walk     Row Name 08/16/20 1634         6 Minute Walk   Phase Initial     Distance 1037 feet     Walk Time 6 minutes     # of Rest Breaks 0     MPH 1.96     METS 2.78     RPE 13     Perceived Dyspnea  2.5     VO2 Peak 9.75     Symptoms Yes (comment)     Comments oxygen saturation dropped to 84% at minute 4, had her stop to perform pursed lip breathing and to increase supplemental oxygen. saO2 immediately came back up to 94%     Resting HR 87 bpm     Resting BP 130/74     Resting Oxygen Saturation  100 %     Exercise Oxygen Saturation  during 6 min walk 84 %     Max Ex. HR 110 bpm     Max Ex. BP 140/78     2 Minute Post BP 126/72           Interval HR     1 Minute HR 87     2 Minute HR 89     3 Minute HR 91     4 Minute HR 103     5 Minute HR 104     6 Minute HR 110  2 Minute Post HR 89     Interval Heart Rate? Yes           Interval Oxygen     Interval Oxygen? Yes     Baseline Oxygen Saturation % 100 %     1 Minute Oxygen Saturation % 95 %     1 Minute Liters of Oxygen 6 L     2 Minute Oxygen Saturation % 93 %     2 Minute Liters of Oxygen 6 L     3 Minute Oxygen Saturation % 88 %     3 Minute Liters of Oxygen 6 L     4 Minute Oxygen Saturation % 84 %     4 Minute Liters of Oxygen 6 L  Increased to 8L     5 Minute Oxygen Saturation % 95 %     5 Minute Liters of Oxygen 8 L     6 Minute Oxygen Saturation % 92 %     6 Minute Liters of Oxygen 8 L     2 Minute Post Oxygen Saturation % 100 %     2 Minute Post Liters of Oxygen 6 L             Oxygen Initial Assessment:  Oxygen Initial Assessment - 08/16/20 1122       Home Oxygen   Home Oxygen Device Home  Concentrator;Portable Concentrator    Sleep Oxygen Prescription Continuous;CPAP    Liters per minute 10    Home Exercise Oxygen Prescription Pulsed    Liters per minute 6    Home Resting Oxygen Prescription Pulsed    Liters per minute 4    Compliance with Home Oxygen Use Yes      Initial 6 min Walk   Oxygen Used Continuous    Liters per minute 6   Had to increase to 8L     Program Oxygen Prescription   Program Oxygen Prescription Continuous    Liters per minute 6    Comments Will need 8L if walking but can do 6L for seated exercise      Intervention   Short Term Goals To learn and exhibit compliance with exercise, home and travel O2 prescription;To learn and understand importance of monitoring SPO2 with pulse oximeter and demonstrate accurate use of the pulse oximeter.;To learn and understand importance of maintaining oxygen saturations>88%;To learn and demonstrate proper pursed lip breathing techniques or other breathing techniques. ;To learn and demonstrate proper use of respiratory medications    Long  Term Goals Exhibits compliance with exercise, home  and travel O2 prescription;Verbalizes importance of monitoring SPO2 with pulse oximeter and return demonstration;Maintenance of O2 saturations>88%;Exhibits proper breathing techniques, such as pursed lip breathing or other method taught during program session;Compliance with respiratory medication;Demonstrates proper use of MDI's             Oxygen Re-Evaluation:  Oxygen Re-Evaluation     Row Name 09/16/20 0734             Program Oxygen Prescription   Program Oxygen Prescription Continuous       Liters per minute 6               Home Oxygen     Home Oxygen Device Home Concentrator;Portable Concentrator       Sleep Oxygen Prescription Continuous;CPAP       Liters per minute 10       Home Exercise Oxygen Prescription Pulsed       Liters  per minute 6       Home Resting Oxygen Prescription Pulsed       Liters per  minute 4       Compliance with Home Oxygen Use Yes               Goals/Expected Outcomes     Short Term Goals To learn and exhibit compliance with exercise, home and travel O2 prescription;To learn and understand importance of monitoring SPO2 with pulse oximeter and demonstrate accurate use of the pulse oximeter.;To learn and understand importance of maintaining oxygen saturations>88%;To learn and demonstrate proper pursed lip breathing techniques or other breathing techniques. ;To learn and demonstrate proper use of respiratory medications       Long  Term Goals Exhibits compliance with exercise, home  and travel O2 prescription;Verbalizes importance of monitoring SPO2 with pulse oximeter and return demonstration;Maintenance of O2 saturations>88%;Exhibits proper breathing techniques, such as pursed lip breathing or other method taught during program session;Compliance with respiratory medication;Demonstrates proper use of MDI's       Goals/Expected Outcomes compliance and understanding of monitoring oxygen saturation and performing pursed lip breathing when necessary.               Oxygen Discharge (Final Oxygen Re-Evaluation):  Oxygen Re-Evaluation - 09/16/20 0734       Program Oxygen Prescription   Program Oxygen Prescription Continuous    Liters per minute 6      Home Oxygen   Home Oxygen Device Home Concentrator;Portable Concentrator    Sleep Oxygen Prescription Continuous;CPAP    Liters per minute 10    Home Exercise Oxygen Prescription Pulsed    Liters per minute 6    Home Resting Oxygen Prescription Pulsed    Liters per minute 4    Compliance with Home Oxygen Use Yes      Goals/Expected Outcomes   Short Term Goals To learn and exhibit compliance with exercise, home and travel O2 prescription;To learn and understand importance of monitoring SPO2 with pulse oximeter and demonstrate accurate use of the pulse oximeter.;To learn and understand importance of maintaining oxygen  saturations>88%;To learn and demonstrate proper pursed lip breathing techniques or other breathing techniques. ;To learn and demonstrate proper use of respiratory medications    Long  Term Goals Exhibits compliance with exercise, home  and travel O2 prescription;Verbalizes importance of monitoring SPO2 with pulse oximeter and return demonstration;Maintenance of O2 saturations>88%;Exhibits proper breathing techniques, such as pursed lip breathing or other method taught during program session;Compliance with respiratory medication;Demonstrates proper use of MDI's    Goals/Expected Outcomes compliance and understanding of monitoring oxygen saturation and performing pursed lip breathing when necessary.             Initial Exercise Prescription:  Initial Exercise Prescription - 08/19/20 1400       Date of Initial Exercise RX and Referring Provider   Date 08/19/20    Referring Provider Dr. Harrietta Guardian    Expected Discharge Date 10/17/20      Oxygen   Oxygen Continuous    Liters 6-8      NuStep   Level 1    SPM 80    Minutes 15      Track   Minutes 15      Prescription Details   Frequency (times per week) 2    Duration Progress to 30 minutes of continuous aerobic without signs/symptoms of physical distress      Intensity   THRR 40-80% of Max Heartrate [phone]  Ratings of Perceived Exertion 11-13    Perceived Dyspnea 0-4      Progression   Progression Continue to progress workloads to maintain intensity without signs/symptoms of physical distress.      Resistance Training   Training Prescription Yes    Weight Red bands    Reps 10-15             Perform Capillary Blood Glucose checks as needed.  Exercise Prescription Changes:   Exercise Prescription Changes     Row Name 08/27/20 1100 09/10/20 1200           Response to Exercise   Blood Pressure (Admit) 136/78 116/82      Blood Pressure (Exercise) 140/66 124/70      Blood Pressure (Exit) 116/78 100/64       Heart Rate (Admit) 118 bpm 96 bpm      Heart Rate (Exercise) 93 bpm 91 bpm      Heart Rate (Exit) 83 bpm 100 bpm      Oxygen Saturation (Admit) 98 % 99 %      Oxygen Saturation (Exercise) 98 % 98 %      Oxygen Saturation (Exit) 100 % 100 %      Rating of Perceived Exertion (Exercise) 11 13      Perceived Dyspnea (Exercise) 1 1      Duration Continue with 30 min of aerobic exercise without signs/symptoms of physical distress. Continue with 30 min of aerobic exercise without signs/symptoms of physical distress.      Intensity Other (comment)  40-80% HR Max THRR unchanged             Progression      Progression Continue to progress workloads to maintain intensity without signs/symptoms of physical distress. Continue to progress workloads to maintain intensity without signs/symptoms of physical distress.             Resistance Training      Training Prescription Yes Yes      Weight Red bands Red bands      Reps 10-15 10-15      Time 10 Minutes 10 Minutes             Oxygen      Oxygen Continuous Continuous      Liters 6 6-8             Bike      Level -- 1      Minutes -- 15      METs -- 1.4             NuStep      Level 2 3      SPM 80 80      Minutes 30 15      METs 1.6 1.6              Exercise Comments:   Exercise Comments     Row Name 08/20/20 1503           Exercise Comments Patient completed first day of exercise and tolerated well with no complaints or concerns. Will continue to monitor.                Exercise Goals and Review:   Exercise Goals     Row Name 08/16/20 1625             Exercise Goals   Increase Physical Activity Yes       Intervention Provide advice, education, support and counseling about physical  activity/exercise needs.;Develop an individualized exercise prescription for aerobic and resistive training based on initial evaluation findings, risk stratification, comorbidities and participant's personal goals.        Expected Outcomes Short Term: Attend rehab on a regular basis to increase amount of physical activity.;Long Term: Add in home exercise to make exercise part of routine and to increase amount of physical activity.;Long Term: Exercising regularly at least 3-5 days a week.       Increase Strength and Stamina Yes       Intervention Provide advice, education, support and counseling about physical activity/exercise needs.;Develop an individualized exercise prescription for aerobic and resistive training based on initial evaluation findings, risk stratification, comorbidities and participant's personal goals.       Expected Outcomes Short Term: Increase workloads from initial exercise prescription for resistance, speed, and METs.;Short Term: Perform resistance training exercises routinely during rehab and add in resistance training at home;Long Term: Improve cardiorespiratory fitness, muscular endurance and strength as measured by increased METs and functional capacity (6MWT)       Able to understand and use rate of perceived exertion (RPE) scale Yes       Intervention Provide education and explanation on how to use RPE scale       Expected Outcomes Short Term: Able to use RPE daily in rehab to express subjective intensity level;Long Term:  Able to use RPE to guide intensity level when exercising independently       Able to understand and use Dyspnea scale Yes       Intervention Provide education and explanation on how to use Dyspnea scale       Expected Outcomes Short Term: Able to use Dyspnea scale daily in rehab to express subjective sense of shortness of breath during exertion;Long Term: Able to use Dyspnea scale to guide intensity level when exercising independently       Knowledge and understanding of Target Heart Rate Range (THRR) Yes       Intervention Provide education and explanation of THRR including how the numbers were predicted and where they are located for reference       Expected Outcomes Short  Term: Able to state/look up THRR;Long Term: Able to use THRR to govern intensity when exercising independently;Short Term: Able to use daily as guideline for intensity in rehab       Understanding of Exercise Prescription Yes       Intervention Provide education, explanation, and written materials on patient's individual exercise prescription       Expected Outcomes Short Term: Able to explain program exercise prescription;Long Term: Able to explain home exercise prescription to exercise independently                Exercise Goals Re-Evaluation :  Exercise Goals Re-Evaluation     Row Name 09/16/20 0730             Exercise Goal Re-Evaluation   Exercise Goals Review Increase Physical Activity;Increase Strength and Stamina;Able to understand and use rate of perceived exertion (RPE) scale;Able to understand and use Dyspnea scale;Knowledge and understanding of Target Heart Rate Range (THRR);Understanding of Exercise Prescription       Comments Aniah has completed 8 exercise sessions and has been fairly slow to make progressions. She has been fairly consistent with workload increases but there has not been much progression with MET level increases. She also has not been wanting to do anything but the Nustep but the last 2 exercise sessions we have encourage her to use  the AirDyne and she has tolerated that well so far. She is exercising at 1.7 METS on the Nustep and 1.4 METS on the Airdyne. Will continue to monitor and progress as she is able.       Expected Outcomes Through exercise at rehab and home, the patient will decrease shortness of breath with daily activities and feel confident in carrying out an exercise regimn at home.                Discharge Exercise Prescription (Final Exercise Prescription Changes):  Exercise Prescription Changes - 09/10/20 1200       Response to Exercise   Blood Pressure (Admit) 116/82    Blood Pressure (Exercise) 124/70    Blood Pressure (Exit)  100/64    Heart Rate (Admit) 96 bpm    Heart Rate (Exercise) 91 bpm    Heart Rate (Exit) 100 bpm    Oxygen Saturation (Admit) 99 %    Oxygen Saturation (Exercise) 98 %    Oxygen Saturation (Exit) 100 %    Rating of Perceived Exertion (Exercise) 13    Perceived Dyspnea (Exercise) 1    Duration Continue with 30 min of aerobic exercise without signs/symptoms of physical distress.    Intensity THRR unchanged      Progression   Progression Continue to progress workloads to maintain intensity without signs/symptoms of physical distress.      Resistance Training   Training Prescription Yes    Weight Red bands    Reps 10-15    Time 10 Minutes      Oxygen   Oxygen Continuous    Liters 6-8      Bike   Level 1    Minutes 15    METs 1.4      NuStep   Level 3    SPM 80    Minutes 15    METs 1.6             Nutrition:  Target Goals: Understanding of nutrition guidelines, daily intake of sodium <151m, cholesterol <2047m calories 30% from fat and 7% or less from saturated fats, daily to have 5 or more servings of fruits and vegetables.  Biometrics:  Pre Biometrics - 08/16/20 1625       Pre Biometrics   Grip Strength 25.5 kg              Nutrition Therapy Plan and Nutrition Goals:  Nutrition Therapy & Goals - 09/17/20 0652       Nutrition Therapy   Diet TLC      Personal Nutrition Goals   Nutrition Goal Pt to read labels to identify high sodium foods and reduce intake    Personal Goal #2 Pt to limit fluids by choosing popsicles, hard candy, and lemon water to reduce dry mouth discomfort    Personal Goal #3 Pt to identify food quantities necessary to achieve weight loss of 6-24 lb at graduation from cardiac rehab.      Intervention Plan   Intervention Nutrition handout(s) given to patient.;Prescribe, educate and counsel regarding individualized specific dietary modifications aiming towards targeted core components such as weight, hypertension, lipid  management, diabetes, heart failure and other comorbidities.    Expected Outcomes Short Term Goal: Understand basic principles of dietary content, such as calories, fat, sodium, cholesterol and nutrients.;Long Term Goal: Adherence to prescribed nutrition plan.             Nutrition Assessments:  MEDIFICTS Score Key: ?70 Need to make dietary changes  40-70 Heart Healthy Diet ? 40 Therapeutic Level Cholesterol Diet  Flowsheet Row PULMONARY REHAB OTHER RESPIRATORY from 08/27/2020 in [place]  Picture Your Plate Total Score on Admission 54      Picture Your Plate Scores: <09 Unhealthy dietary pattern with much room for improvement. 41-50 Dietary pattern unlikely to meet recommendations for good health and room for improvement. 51-60 More healthful dietary pattern, with some room for improvement.  >60 Healthy dietary pattern, although there may be some specific behaviors that could be improved.    Nutrition Goals Re-Evaluation:  Nutrition Goals Re-Evaluation     [place] Name 09/17/20 0653             Goals   Current Weight 236 lb 12.4 oz (107.4 kg)       Nutrition Goal Pt to read labels to identify high sodium foods and reduce intake               Personal Goal #2 Re-Evaluation     Personal Goal #2 Pt to limit fluids by choosing popsicles, hard candy, and lemon water to reduce dry mouth discomfort               Personal Goal #3 Re-Evaluation     Personal Goal #3 Pt to identify food quantities necessary to achieve weight loss of 6-24 lb at graduation from cardiac rehab.               Nutrition Goals Discharge (Final Nutrition Goals Re-Evaluation):  Nutrition Goals Re-Evaluation - 09/17/20 0653       Goals   Current Weight 236 lb 12.4 oz (107.4 kg)    Nutrition Goal Pt to read labels to identify high sodium foods and reduce intake      Personal Goal #2 Re-Evaluation   Personal Goal #2 Pt to limit fluids by choosing popsicles, hard  candy, and lemon water to reduce dry mouth discomfort      Personal Goal #3 Re-Evaluation   Personal Goal #3 Pt to identify food quantities necessary to achieve weight loss of 6-24 lb at graduation from cardiac rehab.             Psychosocial: Target Goals: Acknowledge presence or absence of significant depression and/or stress, maximize coping skills, provide positive support system. Participant is able to verbalize types and ability to use techniques and skills needed for reducing stress and depression.  Initial Review & Psychosocial Screening:  Initial Psych Review & Screening - 08/16/20 1126       Initial Review   Current issues with Current Depression;Current Sleep Concerns   States her depression is because of her illness and not being able to do things she enjoys. She is not currently on any treatment and said she would not be open to any depression medication     Family Dynamics   Good Support System? Yes   Daughters, family and friends     Barriers   Psychosocial barriers to participate in program The patient should benefit from training in stress management and relaxation.      Screening Interventions   Interventions Encouraged to exercise    Expected Outcomes Long Term goal: The participant improves quality of Life and PHQ9 Scores as seen by post scores and/or verbalization of changes;Long Term Goal: Stressors or current issues are controlled or eliminated.             Quality of Life Scores:  Scores of 19 and below usually indicate a  poorer quality of life in these areas.  A difference of  2-3 points is a clinically meaningful difference.  A difference of 2-3 points in the total score of the Quality of Life Index has been associated with significant improvement in overall quality of life, self-image, physical symptoms, and general health in studies assessing change in quality of life.  PHQ-9: Recent Review Flowsheet Data     Depression screen [facility] 2/9 08/16/2020  02/29/2020 10/05/2019 12/22/2018 12/30/2017   Decreased Interest 0 0 0 0 0   Down, Depressed, Hopeless 1 0 0 0 0   PHQ - 2 Score 1 0 0 0 0   Altered sleeping 0 0 - - -   Tired, decreased energy 2 0 - - -   Change in appetite 3 0 - - -   Feeling bad or failure about yourself  1 0 - - -   Trouble concentrating 0 0 - - -   Moving slowly or fidgety/restless 0 0 - - -   Suicidal thoughts 0 0 - - -   PHQ-9 Score - 0 - - -   Difficult doing work/chores Somewhat difficult Not difficult at all - - -      Interpretation of Total Score  Total Score Depression Severity:  1-4 = Minimal depression, 5-9 = Mild depression, 10-14 = Moderate depression, 15-19 = Moderately severe depression, 20-27 = Severe depression   Psychosocial Evaluation and Intervention:  Psychosocial Evaluation - 08/16/20 1628       Psychosocial Evaluation & Interventions   Comments Pt does show signs of depression and admits she has depression. She states the main reason for her depression is due to her illness and all of the things she is limited to doing because of her mental conditon and having to carrying around supplemental oxygen. She has only been on supplemental oxygen for over a year and had to get disability and can no longer work. She struggles with not being able to do the things she enjoys. Her depression is also affecting her sleep and states her sleep schedule is very off.    Expected Outcomes Improve patients quality of life by decreasing shortness of breath with activities with hopes that pt will be able to do more. Also for pt to find healthy ways to relieve stress and to deal with depression, if she refuses treatment.    Continue Psychosocial Services  Follow up required by counselor             Psychosocial Re-Evaluation:  Psychosocial Re-Evaluation     Johnson Name 09/16/20 1416             Psychosocial Re-Evaluation   Current issues with Current Sleep Concerns;Current Depression;Current Stress Concerns        Comments Attributes her depression to her chronic health issues, does not want to take anti-depressant meds.  Seems very positive in class, seems to be handling her stress in healthy ways.       Expected Outcomes For Winni to continue to be positive and learn to relax and handle her stress.       Interventions Encouraged to attend Pulmonary Rehabilitation for the exercise;Relaxation education;Stress management education       Continue Psychosocial Services  No Follow up required               Initial Review     Source of Stress Concerns Chronic Illness;Unable to participate in former interests or hobbies;Unable to perform yard/household activities  Comments Her chronic illness is a major stressor.               Psychosocial Discharge (Final Psychosocial Re-Evaluation):  Psychosocial Re-Evaluation - 09/16/20 1416       Psychosocial Re-Evaluation   Current issues with Current Sleep Concerns;Current Depression;Current Stress Concerns    Comments Attributes her depression to her chronic health issues, does not want to take anti-depressant meds.  Seems very positive in class, seems to be handling her stress in healthy ways.    Expected Outcomes For Brehanna to continue to be positive and learn to relax and handle her stress.    Interventions Encouraged to attend Pulmonary Rehabilitation for the exercise;Relaxation education;Stress management education    Continue Psychosocial Services  No Follow up required      Initial Review   Source of Stress Concerns Chronic Illness;Unable to participate in former interests or hobbies;Unable to perform yard/household activities    Comments Her chronic illness is a major stressor.             Education: Education Goals: Education classes will be provided on a weekly basis, covering required topics. Participant will state understanding/return demonstration of topics presented.  Learning Barriers/Preferences:  Learning  Barriers/Preferences - 08/16/20 1130       Learning Barriers/Preferences   Learning Barriers Sight   Wears glasses   Learning Preferences Written Material;Skilled Demonstration;Individual Instruction             Education Topics: Risk Factor Reduction:  -Group instruction that is supported by a PowerPoint presentation. Instructor discusses the definition of a risk factor, different risk factors for pulmonary disease, and how the heart and lungs work together.     Nutrition for Pulmonary Patient:  -Group instruction provided by PowerPoint slides, verbal discussion, and written materials to support subject matter. The instructor gives an explanation and review of healthy diet recommendations, which includes a discussion on weight management, recommendations for fruit and vegetable consumption, as well as protein, fluid, caffeine, fiber, sodium, sugar, and alcohol. Tips for eating when patients are short of breath are discussed. Flowsheet Row PULMONARY REHAB OTHER RESPIRATORY from 09/12/2020 in [place]  Date 09/12/20  Educator H/O  Instruction Review Code 1- Verbalizes Understanding       Pursed Lip Breathing:  -Group instruction that is supported by demonstration and informational handouts. Instructor discusses the benefits of pursed lip and diaphragmatic breathing and detailed demonstration on how to preform both.     Oxygen Safety:  -Group instruction provided by PowerPoint, verbal discussion, and written material to support subject matter. There is an overview of "What is Oxygen" and "Why do we need it".  Instructor also reviews how to create a safe environment for oxygen use, the importance of using oxygen as prescribed, and the risks of noncompliance. There is a brief discussion on traveling with oxygen and resources the patient may utilize. Flowsheet Row PULMONARY REHAB OTHER RESPIRATORY from 09/12/2020 in [place]  Date 09/05/20  Educator Handout       Oxygen Equipment:  -Group instruction provided by [facility] Staff utilizing handouts, written materials, and equipment demonstrations.   Signs and Symptoms:  -Group instruction provided by written material and verbal discussion to support subject matter. Warning signs and symptoms of infection, stroke, and heart attack are reviewed and when to call the physician/911 reinforced. Tips for preventing the spread of infection discussed.   Advanced Directives:  -Group instruction provided by verbal  instruction and written material to support subject matter. Instructor reviews Advanced Directive laws and proper instruction for filling out document.   Pulmonary Video:  -Group video education that reviews the importance of medication and oxygen compliance, exercise, good nutrition, pulmonary hygiene, and pursed lip and diaphragmatic breathing for the pulmonary patient.   Exercise for the Pulmonary Patient:  -Group instruction that is supported by a PowerPoint presentation. Instructor discusses benefits of exercise, core components of exercise, frequency, duration, and intensity of an exercise routine, importance of utilizing pulse oximetry during exercise, safety while exercising, and options of places to exercise outside of rehab.     Pulmonary Medications:  -Verbally interactive group education provided by instructor with focus on inhaled medications and proper administration.   Anatomy and Physiology of the Respiratory System and Intimacy:  -Group instruction provided by PowerPoint, verbal discussion, and written material to support subject matter. Instructor reviews respiratory cycle and anatomical components of the respiratory system and their functions. Instructor also reviews differences in obstructive and restrictive respiratory diseases with examples of each. Intimacy, Sex, and Sexuality differences are reviewed with a discussion on how  relationships can change when diagnosed with pulmonary disease. Common sexual concerns are reviewed. Flowsheet Row PULMONARY REHAB OTHER RESPIRATORY from 09/12/2020 in [place]  Date 08/29/20  Educator Jess-H/O  Instruction Review Code 1- Verbalizes Understanding       MD DAY -A group question and answer session with a medical doctor that allows participants to ask questions that relate to their pulmonary disease state.   OTHER EDUCATION -Group or individual verbal, written, or video instructions that support the educational goals of the pulmonary rehab program.   Holiday Eating Survival Tips:  -Group instruction provided by PowerPoint slides, verbal discussion, and written materials to support subject matter. The instructor gives patients tips, tricks, and techniques to help them not only survive but enjoy the holidays despite the onslaught of food that accompanies the holidays.   Knowledge Questionnaire Score:  Knowledge Questionnaire Score - 08/16/20 1641       Knowledge Questionnaire Score   Pre Score 12/18             Core Components/Risk Factors/Patient Goals at Admission:  Personal Goals and Risk Factors at Admission - 08/16/20 1632       Core Components/Risk Factors/Patient Goals on Admission    Weight Management Yes;Obesity    Intervention Weight Management: Develop a combined nutrition and exercise program designed to reach desired caloric intake, while maintaining appropriate intake of nutrient and fiber, sodium and fats, and appropriate energy expenditure required for the weight goal.;Weight Management: Provide education and appropriate resources to help participant work on and attain dietary goals.;Weight Management/Obesity: Establish reasonable short term and long term weight goals.    Improve shortness of breath with ADL's Yes    Intervention Provide education, individualized exercise plan and daily activity instruction to help  decrease symptoms of SOB with activities of daily living.    Expected Outcomes Short Term: Improve cardiorespiratory fitness to achieve a reduction of symptoms when performing ADLs;Long Term: Be able to perform more ADLs without symptoms or delay the onset of symptoms             Core Components/Risk Factors/Patient Goals Review:   Goals and Risk Factor Review     Row Name 09/16/20 1420             Core Components/Risk Factors/Patient Goals Review   Personal Goals Review Develop more efficient  breathing techniques such as purse lipped breathing and diaphragmatic breathing and practicing self-pacing with activity.;Increase knowledge of respiratory medications and ability to use respiratory devices properly.;Improve shortness of breath with ADL's       Review Is progressing well on the equipment, is exercising on level 4 on the nustep, and level 1 .       Expected Outcomes See admission goals.                Core Components/Risk Factors/Patient Goals at Discharge (Final Review):   Goals and Risk Factor Review - 09/16/20 1420       Core Components/Risk Factors/Patient Goals Review   Personal Goals Review Develop more efficient breathing techniques such as purse lipped breathing and diaphragmatic breathing and practicing self-pacing with activity.;Increase knowledge of respiratory medications and ability to use respiratory devices properly.;Improve shortness of breath with ADL's    Review Is progressing well on the equipment, is exercising on level 4 on the nustep, and level 1 .    Expected Outcomes See admission goals.             ITP Comments:   Comments: ITP REVIEW Pt is making expected progress toward pulmonary rehab goals after completing 8 sessions. Recommend continued exercise, life style modification, education, and utilization of breathing techniques to increase stamina and strength and decrease shortness of breath with exertion.

## 2020-09-19 ENCOUNTER — Encounter (HOSPITAL_COMMUNITY)
Admission: RE | Admit: 2020-09-19 | Discharge: 2020-09-19 | Disposition: A | Discharge status: Home / Self Care | Payer: 59 | Source: Ambulatory Visit | Attending: Cardiology | Admitting: Cardiology

## 2020-09-19 ENCOUNTER — Other Ambulatory Visit: Payer: Self-pay

## 2020-09-19 VITALS — Wt 240.5 lb

## 2020-09-19 DIAGNOSIS — I2721 Secondary pulmonary arterial hypertension: Secondary | ICD-10-CM

## 2020-09-19 NOTE — Progress Notes (Signed)
Daily Session Note  Patient Details  Name: [name] MRN: [id number] Date of Birth: Mar 12, 1966 Referring Provider:   [name] Pulmonary Rehab Walk Test from 08/16/2020 in [place]  Referring Provider Dr. Harrietta Guardian       Encounter Date: 09/19/2020  Check In:  Session Check In - 09/19/20 1152       Check-In   Supervising physician immediately available to respond to emergencies [facility] immediately available    Physician(s) Dr. Lonny Prude    Location [facility]    Staff Present Rosebud Poles, RN, Isaac Laud, MS, ACSM-CEP, Exercise Physiologist;Lisa Ysidro Evert, RN    Virtual Visit No    Medication changes reported     No    Fall or balance concerns reported    No    Tobacco Cessation No Change    Warm-up and Cool-down Performed as group-led instruction    Resistance Training Performed Yes    VAD Patient? No    PAD/SET Patient? No      Pain Assessment   Currently in Pain? No/denies    Multiple Pain Sites No             Capillary Blood Glucose: No results found for this or any previous visit (from the past 24 hour(s)).    Social History   Tobacco Use  Smoking Status Former   Packs/day: 0.33   Years: 10.00   Pack years: 3.30   Types: Cigarettes   Quit date: 10/04/2011   Years since quitting: 8.9  Smokeless Tobacco Never    Goals Met:  Proper associated with RPD/PD & O2 Sat Exercise tolerated well Strength training completed today  Goals Unmet:  Not Applicable  Comments: Service time is from 1015 to 56    Dr. Fransico Him is Medical Director for Cardiac Rehab at [facility].

## 2020-09-19 NOTE — Progress Notes (Signed)
Nutrition Follow Up Note Met with pt to create a meal plan that fits her lifestyle and helps her reduce sodium intake.  Pt typically uses higher sodium seasonings. We discussed ways to reduce sodium by using less or swapping for low sodium seasonings.  Pt has good understanding of healthy/balanced meals. We used platemethod to create meal plan. Reviewed labels again.  Nutrition Diagnosis   Excessive sodium intake related to over consumption of processed food as evidenced by frequent consumption of convenience food/ canned vegetables, salt shaker use, and seasoning use.   Nutrition Intervention   Pt's individual nutrition plan reviewed with pt. 2 g sodium and 2 L fluid  Continue client-centered nutrition education by RD, as part of interdisciplinary care.   Goal(s) Pt to read labels to identify high sodium foods and reduce intake Pt to limit fluids by choosing popsicles, hard candy, and lemon water to reduce dry mouth discomfort Pt to identify food quantities necessary to achieve weight loss of 6-24 lb at graduation from cardiac rehab.   Plan:  Will provide client-centered nutrition education as part of interdisciplinary care Monitor and evaluate progress toward nutrition goal with team.     Michaele Offer, MS, RDN, LDN

## 2020-09-24 ENCOUNTER — Encounter (HOSPITAL_COMMUNITY): Payer: 59

## 2020-09-24 ENCOUNTER — Telehealth (HOSPITAL_COMMUNITY): Payer: Self-pay | Admitting: Internal Medicine

## 2020-09-26 ENCOUNTER — Other Ambulatory Visit: Payer: Self-pay

## 2020-09-26 ENCOUNTER — Encounter (HOSPITAL_COMMUNITY)
Admission: RE | Admit: 2020-09-26 | Discharge: 2020-09-26 | Disposition: A | Discharge status: Home / Self Care | Payer: 59 | Source: Ambulatory Visit | Attending: Cardiology | Admitting: Cardiology

## 2020-09-26 DIAGNOSIS — I2721 Secondary pulmonary arterial hypertension: Secondary | ICD-10-CM | POA: Diagnosis not present

## 2020-09-26 NOTE — Progress Notes (Signed)
Daily Session Note  Patient Details  Name: [name] MRN: [id number] Date of Birth: 05/30/1965 Referring Provider:   [name] Pulmonary Rehab Walk Test from 08/16/2020 in [place]  Referring Provider Dr. Harrietta Guardian       Encounter Date: 09/26/2020  Check In:  Session Check In - 09/26/20 1154       Check-In   Supervising physician immediately available to respond to emergencies [facility] immediately available    Physician(s) Dr. Alfredia Ferguson    Location [facility]    Staff Present Rosebud Poles, RN, BSN;Ramon Dredge, RN, Fernande Bras, MS, ACSM-CEP, Exercise Physiologist;Lisa Ysidro Evert, RN    Virtual Visit No    Medication changes reported     No    Fall or balance concerns reported    No    Tobacco Cessation No Change    Warm-up and Cool-down Performed as group-led instruction    Resistance Training Performed Yes    VAD Patient? No    PAD/SET Patient? No      Pain Assessment   Currently in Pain? No/denies    Multiple Pain Sites No             Capillary Blood Glucose: No results found for this or any previous visit (from the past 24 hour(s)).    Social History   Tobacco Use  Smoking Status Former   Packs/day: 0.33   Years: 10.00   Pack years: 3.30   Types: Cigarettes   Quit date: 10/04/2011   Years since quitting: 8.9  Smokeless Tobacco Never    Goals Met:  Proper associated with RPD/PD & O2 Sat Exercise tolerated well Strength training completed today  Goals Unmet:  Not Applicable  Comments: Service time is from 1015 to 28    Dr. Fransico Him is Medical Director for Cardiac Rehab at [facility].

## 2020-09-27 ENCOUNTER — Encounter: Payer: Self-pay | Admitting: Internal Medicine

## 2020-10-01 ENCOUNTER — Other Ambulatory Visit: Payer: Self-pay

## 2020-10-01 ENCOUNTER — Encounter (HOSPITAL_COMMUNITY)
Admission: RE | Admit: 2020-10-01 | Discharge: 2020-10-01 | Disposition: A | Discharge status: Home / Self Care | Payer: 59 | Source: Ambulatory Visit | Attending: Cardiology | Admitting: Cardiology

## 2020-10-01 DIAGNOSIS — Z7901 Long term (current) use of anticoagulants: Secondary | ICD-10-CM | POA: Diagnosis not present

## 2020-10-01 DIAGNOSIS — Z9221 Personal history of antineoplastic chemotherapy: Secondary | ICD-10-CM | POA: Diagnosis not present

## 2020-10-01 DIAGNOSIS — C50212 Malignant neoplasm of upper-inner quadrant of left female breast: Secondary | ICD-10-CM | POA: Diagnosis not present

## 2020-10-01 DIAGNOSIS — Z923 Personal history of irradiation: Secondary | ICD-10-CM | POA: Diagnosis not present

## 2020-10-01 DIAGNOSIS — Z7951 Long term (current) use of inhaled steroids: Secondary | ICD-10-CM | POA: Diagnosis not present

## 2020-10-01 DIAGNOSIS — I2721 Secondary pulmonary arterial hypertension: Secondary | ICD-10-CM

## 2020-10-01 DIAGNOSIS — Z79811 Long term (current) use of aromatase inhibitors: Secondary | ICD-10-CM | POA: Diagnosis not present

## 2020-10-01 DIAGNOSIS — Z170 Estrogen receptor positive status [ER+]: Secondary | ICD-10-CM | POA: Diagnosis not present

## 2020-10-01 DIAGNOSIS — Z79899 Other long term (current) drug therapy: Secondary | ICD-10-CM | POA: Diagnosis not present

## 2020-10-01 NOTE — Progress Notes (Signed)
Daily Session Note  Patient Details  Name: [name] MRN: [id number] Date of Birth: October 08, 1965 Referring Provider:   [name] Pulmonary Rehab Walk Test from 08/16/2020 in [place]  Referring Provider Dr. Harrietta Guardian       Encounter Date: 10/01/2020  Check In:  Session Check In - 10/01/20 1127       Check-In   Supervising physician immediately available to respond to emergencies [facility] immediately available    Physician(s) Dr. Alfredia Ferguson    [facility]    Staff Present Rosebud Poles, RN, Milus Glazier, MS, ACSM-CEP, CCRP, Exercise Physiologist;Jessica Hassell Done, MS, ACSM-CEP, Exercise Physiologist;Zayde Stroupe Ysidro Evert, RN    Virtual Visit No    Medication changes reported     No    Fall or balance concerns reported    No    Tobacco Cessation No Change    Warm-up and Cool-down Performed as group-led instruction    Resistance Training Performed Yes    VAD Patient? No    PAD/SET Patient? No      Pain Assessment   Currently in Pain? No/denies    Multiple Pain Sites No             Capillary Blood Glucose: No results found for this or any previous visit (from the past 24 hour(s)).    Social History   Tobacco Use  Smoking Status Former   Packs/day: 0.33   Years: 10.00   Pack years: 3.30   Types: Cigarettes   Quit date: 10/04/2011   Years since quitting: 9.0  Smokeless Tobacco Never    Goals Met:  Exercise tolerated well No report of cardiac concerns or symptoms Strength training completed today  Goals Unmet:  Not Applicable  Comments: Service time is from 1015 to 1134    Dr. Fransico Him is Medical Director for Cardiac Rehab at [facility].

## 2020-10-03 ENCOUNTER — Encounter (HOSPITAL_COMMUNITY)
Admission: RE | Admit: 2020-10-03 | Discharge: 2020-10-03 | Disposition: A | Discharge status: Home / Self Care | Payer: 59 | Source: Ambulatory Visit | Attending: Cardiology | Admitting: Cardiology

## 2020-10-03 ENCOUNTER — Other Ambulatory Visit: Payer: Self-pay

## 2020-10-03 DIAGNOSIS — I2721 Secondary pulmonary arterial hypertension: Secondary | ICD-10-CM

## 2020-10-03 DIAGNOSIS — C50212 Malignant neoplasm of upper-inner quadrant of left female breast: Secondary | ICD-10-CM | POA: Diagnosis not present

## 2020-10-03 NOTE — Progress Notes (Signed)
Daily Session Note  Patient Details  Name: [name] MRN: [id number] Date of Birth: [birth year] Referring Provider:   [name] Pulmonary Rehab Walk Test from 08/16/2020 in [place]  Referring Provider Dr. Harrietta Guardian       Encounter Date: 10/03/2020  Check In:  Session Check In - 10/03/20 1152       Check-In   Supervising physician immediately available to respond to emergencies [facility] immediately available    Physician(s) Dr. Cruzita Lederer    [facility]    Staff Present Rosebud Poles, RN, BSN;Carlette Wilber Oliphant, RN, Isaac Laud, MS, ACSM-CEP, Exercise Physiologist;Lisa Ysidro Evert, RN    Virtual Visit No    Medication changes reported     No    Fall or balance concerns reported    No    Tobacco Cessation No Change    Warm-up and Cool-down Performed as group-led instruction    Resistance Training Performed Yes    VAD Patient? No    PAD/SET Patient? No      Pain Assessment   Currently in Pain? No/denies    Multiple Pain Sites No             Capillary Blood Glucose: No results found for this or any previous visit (from the past 24 hour(s)).    Social History   Tobacco Use  Smoking Status Former   Packs/day: 0.33   Years: 10.00   Pack years: 3.30   Types: Cigarettes   Quit date: 10/04/2011   Years since quitting: 9.0  Smokeless Tobacco Never    Goals Met:  Proper associated with RPD/PD & O2 Sat Exercise tolerated well No report of cardiac concerns or symptoms Strength training completed today  Goals Unmet:  Not Applicable  Comments: Service time is from 1025 to 1140    Dr. Fransico Him is Medical Director for Cardiac Rehab at [facility].

## 2020-10-08 ENCOUNTER — Other Ambulatory Visit: Payer: Self-pay

## 2020-10-08 ENCOUNTER — Encounter (HOSPITAL_COMMUNITY)
Admission: RE | Admit: 2020-10-08 | Discharge: 2020-10-08 | Disposition: A | Discharge status: Home / Self Care | Payer: 59 | Source: Ambulatory Visit | Attending: Cardiology | Admitting: Cardiology

## 2020-10-08 VITALS — Wt 241.0 lb

## 2020-10-08 DIAGNOSIS — Z9221 Personal history of antineoplastic chemotherapy: Secondary | ICD-10-CM | POA: Insufficient documentation

## 2020-10-08 DIAGNOSIS — Z79899 Other long term (current) drug therapy: Secondary | ICD-10-CM | POA: Insufficient documentation

## 2020-10-08 DIAGNOSIS — Z7951 Long term (current) use of inhaled steroids: Secondary | ICD-10-CM | POA: Insufficient documentation

## 2020-10-08 DIAGNOSIS — C50212 Malignant neoplasm of upper-inner quadrant of left female breast: Secondary | ICD-10-CM | POA: Insufficient documentation

## 2020-10-08 DIAGNOSIS — Z923 Personal history of irradiation: Secondary | ICD-10-CM | POA: Insufficient documentation

## 2020-10-08 DIAGNOSIS — Z7901 Long term (current) use of anticoagulants: Secondary | ICD-10-CM | POA: Insufficient documentation

## 2020-10-08 DIAGNOSIS — Z170 Estrogen receptor positive status [ER+]: Secondary | ICD-10-CM | POA: Insufficient documentation

## 2020-10-08 DIAGNOSIS — I2721 Secondary pulmonary arterial hypertension: Secondary | ICD-10-CM

## 2020-10-08 DIAGNOSIS — Z79811 Long term (current) use of aromatase inhibitors: Secondary | ICD-10-CM | POA: Insufficient documentation

## 2020-10-08 NOTE — Progress Notes (Signed)
Daily Session Note  Patient Details  Name: [name] MRN: [id number] Date of Birth: 02-17-1966 Referring Provider:   [name] Pulmonary Rehab Walk Test from 08/16/2020 in [place]  Referring Provider Dr. Harrietta Guardian       Encounter Date: 10/08/2020  Check In:  Session Check In - 10/08/20 [place]       Check-In   Supervising physician immediately available to respond to emergencies [facility] immediately available    Physician(s) Dr. Cruzita Lederer    Location [facility]    Staff Present Rosebud Poles, RN, Isaac Laud, MS, ACSM-CEP, Exercise Physiologist;Lisa Ysidro Evert, RN    Virtual Visit No    Medication changes reported     No    Fall or balance concerns reported    No    Tobacco Cessation No Change    Warm-up and Cool-down Performed as group-led instruction    Resistance Training Performed Yes    VAD Patient? No    PAD/SET Patient? No      Pain Assessment   Currently in Pain? No/denies    Pain Score 0-No pain    Multiple Pain Sites No             Capillary Blood Glucose: No results found for this or any previous visit (from the past 24 hour(s)).   Exercise Prescription Changes - 10/08/20 1200       Response to Exercise   Blood Pressure (Admit) 144/82    Blood Pressure (Exercise) 134/64    Blood Pressure (Exit) 122/64    Heart Rate (Admit) 97 bpm    Heart Rate (Exercise) 128 bpm    Heart Rate (Exit) 87 bpm    Oxygen Saturation (Admit) 98 %    Oxygen Saturation (Exercise) 96 %    Oxygen Saturation (Exit) 100 %    Rating of Perceived Exertion (Exercise) 13    Perceived Dyspnea (Exercise) 1.5    Duration Continue with 30 min of aerobic exercise without signs/symptoms of physical distress.    Intensity THRR unchanged      Progression   Progression Continue to progress workloads to maintain intensity without signs/symptoms of physical distress.      Resistance Training   Training Prescription Yes     Weight Red bands    Reps 10-15    Time 10 Minutes      Oxygen   Oxygen Continuous    Liters 6      NuStep   Level 5    SPM 80    Minutes 15    METs 1.5      Track   Laps 11    Minutes 15    METs 2.28             Social History   Tobacco Use  Smoking Status Former   Packs/day: 0.33   Years: 10.00   Pack years: 3.30   Types: Cigarettes   Quit date: 10/04/2011   Years since quitting: 9.0  Smokeless Tobacco Never    Goals Met:  Proper associated with RPD/PD & O2 Sat Exercise tolerated well No report of cardiac concerns or symptoms Strength training completed today  Goals Unmet:  Not Applicable  Comments: Service time is from 1025 to 1140    Dr. Fransico Him is Medical Director for Cardiac Rehab at [facility].

## 2020-10-08 NOTE — Progress Notes (Signed)
I have reviewed a Home Exercise Prescription with [name] . [name] is not currently exercising at home. The patient stated she was keeping her granddaughter for a month and that was enough exercise for her. Her granddaughter is not staying with her anymore so we discussed ways to exercise outside of rehab.The patient was advised to walk, do YouTube cardio workouts, and to also do resistance training with resistance bands at least 2-3 days a week for 30-45 minutes.  [name] and I discussed how to progress their exercise prescription. The patient stated that their goals were to be healthier, lose weight, and to tone her muscles. The patient stated that they understand the exercise prescription.  We reviewed exercise guidelines, target heart rate during exercise, RPE Scale, weather conditions, use of rescue inhaler, endpoints for exercise, warmup and cool down.  Patient is encouraged to come to me with any questions. I will continue to follow up with the patient to assist them with progression and safety.    Rick Duff MS, ACSM CEP 4:29 PM 10/08/2020

## 2020-10-09 ENCOUNTER — Other Ambulatory Visit (HOSPITAL_COMMUNITY): Payer: Self-pay | Admitting: Pulmonary Disease

## 2020-10-09 DIAGNOSIS — I272 Pulmonary hypertension, unspecified: Secondary | ICD-10-CM

## 2020-10-10 ENCOUNTER — Encounter (HOSPITAL_COMMUNITY)
Admission: RE | Admit: 2020-10-10 | Discharge: 2020-10-10 | Disposition: A | Discharge status: Home / Self Care | Payer: 59 | Source: Ambulatory Visit | Attending: Cardiology | Admitting: Cardiology

## 2020-10-10 ENCOUNTER — Other Ambulatory Visit: Payer: Self-pay

## 2020-10-10 DIAGNOSIS — C50212 Malignant neoplasm of upper-inner quadrant of left female breast: Secondary | ICD-10-CM | POA: Diagnosis not present

## 2020-10-10 DIAGNOSIS — I2721 Secondary pulmonary arterial hypertension: Secondary | ICD-10-CM

## 2020-10-10 NOTE — Progress Notes (Addendum)
Daily Session Note  Patient Details  Name: Anwen Cannedy MRN: [id number] Date of Birth: [birth year] Referring Provider:   [name] Pulmonary Rehab Walk Test from 08/16/2020 in [place]  Referring Provider Dr. Harrietta Guardian       Encounter Date: 10/10/2020  Check In:  Session Check In - 10/10/20 1125       Check-In   Supervising physician immediately available to respond to emergencies [facility] immediately available    Physician(s) Dr. Kurtis Bushman    Staff Present Esmeralda Links BS, ACSM EP-C, Exercise Physiologist;Marshea Wisher Ysidro Evert, RN;Jessica Hassell Done, MS, ACSM-CEP, Exercise Physiologist    Virtual Visit No    Medication changes reported     No    Fall or balance concerns reported    No    Tobacco Cessation No Change    Warm-up and Cool-down Performed as group-led instruction    Resistance Training Performed Yes    VAD Patient? No    PAD/SET Patient? No      Pain Assessment   Currently in Pain? No/denies    Multiple Pain Sites No             Capillary Blood Glucose: No results found for this or any previous visit (from the past 24 hour(s)).    Social History   Tobacco Use  Smoking Status Former   Packs/day: 0.33   Years: 10.00   Pack years: 3.30   Types: Cigarettes   Quit date: 10/04/2011   Years since quitting: 9.0  Smokeless Tobacco Never    Goals Met:  No report of cardiac concerns or symptoms Strength training completed today  Goals Unmet:  Not Applicable  Comments: Service time is from 1025 to 1135 Pt tearful today and seems depressed. Emotional support provided. Pt encouraged to call her PCP to get a referral for a counselor.   Dr. Fransico Him is Medical Director for Cardiac Rehab at [facility].

## 2020-10-14 NOTE — Progress Notes (Signed)
Patient Care Team: McLean-Scocuzza, Nino Glow, MD as PCP - General (Internal Medicine) Vonna Drafts, FNP as Nurse Practitioner (Nurse Practitioner) Stark Klein, MD as Consulting Physician (General Surgery) Nicholas Lose, MD as Consulting Physician (Hematology and Oncology) Gery Pray, MD as Consulting Physician (Radiation Oncology) Gardenia Phlegm, NP as Nurse Practitioner (Hematology and Oncology)  DIAGNOSIS:    ICD-10-CM   1. Malignant neoplasm of upper-inner quadrant of left breast in female, estrogen receptor positive ([place])  C50.212    Z17.0       SUMMARY OF ONCOLOGIC HISTORY: Oncology History  Malignant neoplasm of upper-inner quadrant of left breast in female, estrogen receptor positive ([place])  03/04/2016 Initial Diagnosis   Screening detected left breast density posterior medial 1.4 cm by ultrasound axilla negative, grade 3 IDC ER 60%, PR 0%, Ki-67 30%, HER-2 positive ratio 6.04, copy #16; T1 cN0 stage IA clinical stage    03/21/2016 Genetic Testing   NEgative genetic testing on the comprehensive cancer panel and Negative genetic testing for the MSH2 inversion analysis ([place] inversion). The Comprehensive Cancer Panel offered by [facility] includes sequencing and/or deletion duplication testing of the following 32 genes: APC, ATM, AXIN2, BARD1, BMPR1A, BRCA1, BRCA2, BRIP1, CDH1, CDK4, CDKN2A, CHEK2, EPCAM, FANCC, MLH1, MSH2, MSH6, MUTYH, NBN, PALB2, PMS2, POLD1, POLE, PTEN, RAD51C, RAD51D, SCG5/GREM1, SMAD4, STK11, TP53, VHL, and XRCC2.   The report date is March 21, 2016.    07/27/2016 Surgery   Left lumpectomy: IDC grade 2, 2.6 cm, DCIS intermediate grade,0/3 lymph nodes negative, ER 60%, PR 0%, Ki-67 30%, HER-2 positive ratio 6.04, T2 N0 stage II a    09/01/2016 - 11/03/2016 Chemotherapy   Abraxane Herceptin weekly 12 followed by Herceptin maintenance every 3 weeks      11/19/2016 - 01/20/2017 Radiation Therapy   Adjuvant radiation therapy    01/29/2017  -  Anti-estrogen oral therapy   Anastrozole 2.5 mg daily      CHIEF COMPLIANT: Follow-up of left breast cancer on anastrozole   INTERVAL HISTORY: [name] is a 55 y.o. with above-mentioned history of left breast cancer treated with lumpectomy, adjuvant chemotherapy, and radiation who is currently on anastrozole therapy. Mammogram on 03/29/2020 showed no evidence of malignancy bilaterally. She presents to the clinic today for follow-up.  She has pulmonary artery hypertension and using medications to treat that and uses oxygen 24/7. She has itching associated with the radiation scar  ALLERGIES:  is allergic to ampicillin, amoxicillin, indomethacin, nsaids, metronidazole, and sulfa antibiotics.  MEDICATIONS:  Current Outpatient Medications  Medication Sig Dispense Refill   acetaminophen (TYLENOL) 500 MG tablet Take 1,000 mg by mouth every 6 (six) hours as needed for moderate pain or headache.      albuterol (VENTOLIN HFA) 108 (90 Base) MCG/ACT inhaler Inhale 1-2 puffs into the lungs every 6 (six) hours as needed for wheezing or shortness of breath. 18 g 11   allopurinol (ZYLOPRIM) 300 MG tablet TAKE 1 TABLET (300 MG TOTAL) BY MOUTH DAILY. APPT FURTHER REFILLS AFTER 6 MONTHS CALL TO SCHEDULE NOW PLEASE 90 tablet 1   anastrozole (ARIMIDEX) 1 MG tablet Take 1 tablet (1 mg total) by mouth daily. 90 tablet 3   azelastine (ASTELIN) 0.1 % nasal spray Place 1 spray into both nostrils 2 (two) times daily. Use in each nostril as directed 30 mL 12   Cholecalciferol 1.25 MG (50000 UT) capsule Take 1 capsule (50,000 Units total) by mouth once a week. d3 13 capsule 1   Colchicine (MITIGARE) 0.6 MG CAPS  Take 1 capsule by mouth as needed. As needed gout flare max # of pills 2 in 24 hours 30 capsule 11   fluticasone (FLONASE) 50 MCG/ACT nasal spray Place into both nostrils daily as needed for allergies or rhinitis.     Fluticasone-Salmeterol (ADVAIR) 250-50 MCG/DOSE AEPB Inhale 1 puff into the lungs 2  (two) times daily as needed. Rinse mouth 60 each 12   ibuprofen (ADVIL) 800 MG tablet Take 1 tablet (800 mg total) by mouth every 8 (eight) hours as needed. 90 tablet 2   ipratropium-albuterol (DUONEB) 0.5-2.5 (3) MG/3ML SOLN Take 3 mLs by nebulization every 4 (four) hours as needed. 360 mL 12   loratadine (CLARITIN) 10 MG tablet Take 10 mg by mouth daily.     Magnesium Cl-Calcium Carbonate (SLOW MAGNESIUM/CALCIUM PO) Take by mouth.     metoprolol tartrate (LOPRESSOR) 25 MG tablet Take 1 tablet (25 mg total) by mouth 2 (two) times daily. 180 tablet 3   Multiple Vitamin (MULTIVITAMIN) tablet Take 1 tablet by mouth daily.     Nirmatrelvir & Ritonavir (PAXLOVID) 20 x 150 MG & 10 x 100MG TBPK See package instructions.     OPSUMIT 10 MG TABS Take 10 mg by mouth daily.  8   Polyethyl Glycol-Propyl Glycol (SYSTANE OP) Place 1 drop into both eyes daily as needed (dry eyes).      rivaroxaban (XARELTO) 20 MG TABS tablet Take 20 mg by mouth daily with supper.     sildenafil (REVATIO) 20 MG tablet Take 1-2 tablets (20-40 mg total) by mouth 3 (three) times daily. As of 07/03/20 taking 20 mg tid per [facility] pulm Dr. Harrietta Guardian 10 tablet 0   spironolactone (ALDACTONE) 25 MG tablet Take 25 mg by mouth daily.     torsemide (DEMADEX) 100 MG tablet Take 100 mg by mouth daily.      treprostinil (REMODULIN) 20 MG/20ML injection 20 ng/kg/min by Continuous infusion (non-IV) route continuous.     triamcinolone cream (KENALOG) 0.1 % Apply 1 application topically daily as needed (leg discoloration). 454 g 2   No current facility-administered medications for this visit.   Facility-Administered Medications Ordered in Other Visits  Medication Dose Route Frequency Provider Last Rate Last Admin   heparin lock flush 100 unit/mL  500 Units Intracatheter Once PRN Nicholas Lose, MD       sodium chloride flush (NS) 0.9 % injection 10 mL  10 mL Intracatheter PRN Nicholas Lose, MD        PHYSICAL EXAMINATION: ECOG PERFORMANCE STATUS: 1  - Symptomatic but completely ambulatory  Vitals:   10/15/20 0920  BP: 111/63  Pulse: 100  Resp: 18  Temp: 97.7 F (36.5 C)  SpO2: 98%   Filed Weights   10/15/20 0920  Weight: 239 lb 14.4 oz (108.8 kg)    BREAST: Left breast skin darkening and firm masslike appearance at the site of surgery and radiation.  It is accompanied by itching sensation.  (exam performed in the presence of a chaperone)  LABORATORY DATA:  I have reviewed the data as listed CMP Latest Ref Rng & Units 03/05/2020 01/11/2019 01/04/2019  Glucose 65 - 99 mg/dL 102(H) 134(H) 122(H)  BUN 6 - 24 mg/dL 18 27(H) 23  Creatinine 0.57 - 1.00 mg/dL 0.91 1.42(H) 1.41(H)  Sodium 134 - 144 mmol/L 143 137 143  Potassium 3.5 - 5.2 mmol/L 3.3(L) 3.8 4.3  Chloride 96 - 106 mmol/L 104 95(L) 94(L)  CO2 20 - 29 mmol/L 23 32 29  Calcium 8.7 -  10.2 mg/dL 8.7 9.2 9.2  Total Protein 6.0 - 8.5 g/dL 7.6 - -  Total Bilirubin 0.0 - 1.2 mg/dL 0.8 - -  Alkaline Phos 44 - 121 IU/L 155(H) - -  AST 0 - 40 IU/L 9 - -  ALT 0 - 32 IU/L 7 - -    Lab Results  Component Value Date   WBC 6.4 03/05/2020   HGB 12.4 03/05/2020   HCT 35.4 03/05/2020   MCV 92 03/05/2020   PLT 130 (L) 03/05/2020   NEUTROABS 4.8 03/05/2020    ASSESSMENT & PLAN:  Malignant neoplasm of upper-inner quadrant of left breast in female, estrogen receptor positive ([place]) Left lumpectomy 07/28/2016: IDC grade 2, 2.6 cm, DCIS intermediate grade,0/3 lymph nodes negative, ER 60%, PR 0%, Ki-67 30%, HER-2 positive ratio 6.04, T2 N0 stage II a   Treatment summary: 1. Adjuvant chemotherapy with Abraxane and Herceptin (cannot take Taxol because she could not receive steroids) weekly 12 followed by every 3 week Herceptin for one year until May 2019 2. followed by adjuvant radiation 11/25/2016-01/05/2017  3. Followed by adjuvant antiestrogen therapy started  01/28/2017 ------------------------------------------------------------------------------------------------------------------------------- Treatment plan: Adjuvant antiestrogen therapy with anastrozole 1 mg daily 01/28/2017 Adjuvant Herceptin completed May 2019   Anastrozole toxicities: Denies any hot flashes arthralgias or myalgias. Pulmonary hypertension: This is probably her biggest problem currently. Recurrent gout   Surveillance: 1. Breast exam 10/15/2020: Left breast scar tissue in the upper inner aspect of the breast only mammogram can tell if that is completely benign.  We discussed surgical options and I do not recommend surgery because it can lead to poor wound healing. 2. mammograms  03/11/2020 at [facility] benign: Breast density category B   Pulmonary hypertension: Seeing pulmonologist at [facility], uses oxygen 24 / 7   Return to clinic in 1 year for follow-up      No orders of the defined types were placed in this encounter.  The patient has a good understanding of the overall plan. she agrees with it. she will call with any problems that may develop before the next visit here.  Total time spent: 20 mins including face to face time and time spent for planning, charting and coordination of care  Rulon Eisenmenger, MD, MPH 10/15/2020  I, Thana Ates, am acting as scribe for Dr. Nicholas Lose.  I have reviewed the above documentation for accuracy and completeness, and I agree with the above.

## 2020-10-15 ENCOUNTER — Inpatient Hospital Stay: Payer: 59 | Attending: Hematology and Oncology | Admitting: Hematology and Oncology

## 2020-10-15 ENCOUNTER — Other Ambulatory Visit: Payer: Self-pay

## 2020-10-15 ENCOUNTER — Encounter (HOSPITAL_COMMUNITY)
Admission: RE | Admit: 2020-10-15 | Discharge: 2020-10-15 | Disposition: A | Discharge status: Home / Self Care | Payer: 59 | Source: Ambulatory Visit | Attending: Cardiology | Admitting: Cardiology

## 2020-10-15 DIAGNOSIS — C50212 Malignant neoplasm of upper-inner quadrant of left female breast: Secondary | ICD-10-CM | POA: Diagnosis not present

## 2020-10-15 DIAGNOSIS — Z170 Estrogen receptor positive status [ER+]: Secondary | ICD-10-CM

## 2020-10-15 DIAGNOSIS — Z9221 Personal history of antineoplastic chemotherapy: Secondary | ICD-10-CM | POA: Insufficient documentation

## 2020-10-15 DIAGNOSIS — Z79899 Other long term (current) drug therapy: Secondary | ICD-10-CM | POA: Insufficient documentation

## 2020-10-15 DIAGNOSIS — Z7951 Long term (current) use of inhaled steroids: Secondary | ICD-10-CM | POA: Insufficient documentation

## 2020-10-15 DIAGNOSIS — Z79811 Long term (current) use of aromatase inhibitors: Secondary | ICD-10-CM | POA: Insufficient documentation

## 2020-10-15 DIAGNOSIS — I2721 Secondary pulmonary arterial hypertension: Secondary | ICD-10-CM | POA: Insufficient documentation

## 2020-10-15 DIAGNOSIS — Z7901 Long term (current) use of anticoagulants: Secondary | ICD-10-CM | POA: Insufficient documentation

## 2020-10-15 DIAGNOSIS — Z923 Personal history of irradiation: Secondary | ICD-10-CM | POA: Insufficient documentation

## 2020-10-15 MED ORDER — POTASSIUM CHLORIDE CRYS ER 20 MEQ PO TBCR: 20.0000 meq | EXTENDED_RELEASE_TABLET | Freq: Every day | ORAL | Status: DC

## 2020-10-15 MED ORDER — ANASTROZOLE 1 MG PO TABS: 1.0000 mg | ORAL_TABLET | Freq: Every day | ORAL | 3 refills | Status: DC

## 2020-10-15 MED ORDER — SLOW MAGNESIUM/CALCIUM 70-117 MG PO TBEC: 2.0000 | DELAYED_RELEASE_TABLET | Freq: Two times a day (BID) | ORAL | Status: DC

## 2020-10-15 NOTE — Progress Notes (Signed)
Daily Session Note  Patient Details  Name: [name] MRN: [id number] Date of Birth: Mar 19, 1966 Referring Provider:   [name] Pulmonary Rehab Walk Test from 08/16/2020 in [place]  Referring Provider Dr. Harrietta Guardian       Encounter Date: 10/15/2020  Check In:  Session Check In - 10/15/20 1126       Check-In   Supervising physician immediately available to respond to emergencies [facility] immediately available    Physician(s) Dr. Sabino Gasser    Location [facility]    Staff Present Rosebud Poles, RN, BSN;Lisa Ysidro Evert, RN;Jessica Hassell Done, MS, ACSM-CEP, Exercise Physiologist    Virtual Visit No    Medication changes reported     No    Fall or balance concerns reported    No    Tobacco Cessation No Change    Warm-up and Cool-down Performed as group-led instruction    Resistance Training Performed Yes    VAD Patient? No    PAD/SET Patient? No      Pain Assessment   Currently in Pain? No/denies    Pain Score 0-No pain    Multiple Pain Sites No             Capillary Blood Glucose: No results found for this or any previous visit (from the past 24 hour(s)).    Social History   Tobacco Use  Smoking Status Former   Packs/day: 0.33   Years: 10.00   Pack years: 3.30   Types: Cigarettes   Quit date: 10/04/2011   Years since quitting: 9.0  Smokeless Tobacco Never    Goals Met:  Proper associated with RPD/PD & O2 Sat Exercise tolerated well No report of cardiac concerns or symptoms Strength training completed today  Goals Unmet:  Not Applicable  Comments: Service time is from 1020 to 1140    Dr. Fransico Him is Medical Director for Cardiac Rehab at [facility].

## 2020-10-15 NOTE — Assessment & Plan Note (Signed)
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 Adjuvant chemotherapy with Abraxane and Herceptin (cannot take Taxol because she could not receive steroids)weekly 12 followed by every 3 week Herceptin for one yearuntil May 2019 2. followed by adjuvant radiation08/29/2018-01/05/2017 3. Followed by adjuvant antiestrogen therapystarted 01/28/2017 ------------------------------------------------------------------------------------------------------------------------------- Treatment plan: Adjuvant antiestrogen therapy withanastrozole 1 mg daily 01/28/2017 Adjuvant Herceptin completed May 2019  Anastrozole toxicities:Denies any hot flashes arthralgias or myalgias. Pulmonary hypertension: This is probably her biggest problem currently. Recurrent gout  Surveillance: 1. Breast exam 10/15/2020: Benign 2.mammograms  03/11/2020 at [facility] benign: Breast density category B  Pulmonary hypertension: Seeing pulmonologist at [facility]  Return to clinic in 1 year for follow-up

## 2020-10-15 NOTE — Progress Notes (Signed)
Pulmonary Individual Treatment Plan  Patient Details  Name: [name] MRN: [id number] Date of Birth: 1966/01/21 Referring Provider:   [name] Pulmonary Rehab Walk Test from 08/16/2020 in [facility]  Referring Provider Dr. Harrietta Guardian       Initial Encounter Date:  Flowsheet Row Pulmonary Rehab Walk Test from 08/16/2020 in [facility]  Date 08/19/20       Visit Diagnosis: Secondary pulmonary arterial hypertension ([place])  Patient's Home Medications on Admission:   Current Outpatient Medications:    acetaminophen (TYLENOL) 500 MG tablet, Take 1,000 mg by mouth every 6 (six) hours as needed for moderate pain or headache. , Disp: , Rfl:    albuterol (VENTOLIN HFA) 108 (90 Base) MCG/ACT inhaler, Inhale 1-2 puffs into the lungs every 6 (six) hours as needed for wheezing or shortness of breath., Disp: 18 g, Rfl: 11   allopurinol (ZYLOPRIM) 300 MG tablet, TAKE 1 TABLET (300 MG TOTAL) BY MOUTH DAILY. APPT FURTHER REFILLS AFTER 6 MONTHS CALL TO SCHEDULE NOW PLEASE, Disp: 90 tablet, Rfl: 1   anastrozole (ARIMIDEX) 1 MG tablet, Take 1 tablet (1 mg total) by mouth daily., Disp: 90 tablet, Rfl: 3   azelastine (ASTELIN) 0.1 % nasal spray, Place 1 spray into both nostrils 2 (two) times daily. Use in each nostril as directed, Disp: 30 mL, Rfl: 12   Cholecalciferol 1.25 MG (50000 UT) capsule, Take 1 capsule (50,000 Units total) by mouth once a week. d3, Disp: 13 capsule, Rfl: 1   Colchicine (MITIGARE) 0.6 MG CAPS, Take 1 capsule by mouth as needed. As needed gout flare max # of pills 2 in 24 hours, Disp: 30 capsule, Rfl: 11   fluticasone (FLONASE) 50 MCG/ACT nasal spray, Place into both nostrils daily as needed for allergies or rhinitis., Disp: , Rfl:    Fluticasone-Salmeterol (ADVAIR) 250-50 MCG/DOSE AEPB, Inhale 1 puff into the lungs 2 (two) times daily as needed. Rinse mouth, Disp: 60 each, Rfl: 12   ibuprofen (ADVIL) 800 MG tablet,  Take 1 tablet (800 mg total) by mouth every 8 (eight) hours as needed., Disp: 90 tablet, Rfl: 2   ipratropium-albuterol (DUONEB) 0.5-2.5 (3) MG/3ML SOLN, Take 3 mLs by nebulization every 4 (four) hours as needed., Disp: 360 mL, Rfl: 12   loratadine (CLARITIN) 10 MG tablet, Take 10 mg by mouth daily., Disp: , Rfl:    Magnesium Cl-Calcium Carbonate (SLOW MAGNESIUM/CALCIUM PO), Take by mouth., Disp: , Rfl:    metoprolol tartrate (LOPRESSOR) 25 MG tablet, Take 1 tablet (25 mg total) by mouth 2 (two) times daily., Disp: 180 tablet, Rfl: 3   Multiple Vitamin (MULTIVITAMIN) tablet, Take 1 tablet by mouth daily., Disp: , Rfl:    Nirmatrelvir & Ritonavir (PAXLOVID) 20 x 150 MG & 10 x 100MG TBPK, See package instructions., Disp: , Rfl:    OPSUMIT 10 MG TABS, Take 10 mg by mouth daily., Disp: , Rfl: 8   Polyethyl Glycol-Propyl Glycol (SYSTANE OP), Place 1 drop into both eyes daily as needed (dry eyes). , Disp: , Rfl:    rivaroxaban (XARELTO) 20 MG TABS tablet, Take 20 mg by mouth daily with supper., Disp: , Rfl:    sildenafil (REVATIO) 20 MG tablet, Take 1-2 tablets (20-40 mg total) by mouth 3 (three) times daily. As of 07/03/20 taking 20 mg tid per [facility] Dr. Harrietta Guardian, Disp: 10 tablet, Rfl: 0   spironolactone (ALDACTONE) 25 MG tablet, Take 25 mg by mouth daily., Disp: , Rfl:  torsemide (DEMADEX) 100 MG tablet, Take 100 mg by mouth daily. , Disp: , Rfl:    treprostinil (REMODULIN) 20 MG/20ML injection, 20 ng/kg/min by Continuous infusion (non-IV) route continuous., Disp: , Rfl:    triamcinolone cream (KENALOG) 0.1 %, Apply 1 application topically daily as needed (leg discoloration)., Disp: 454 g, Rfl: 2 No current facility-administered medications for this encounter.  Facility-Administered Medications Ordered in Other Encounters:    heparin lock flush 100 unit/mL, 500 Units, Intracatheter, Once PRN, Nicholas Lose, MD   sodium chloride flush (NS) 0.9 % injection 10 mL, 10 mL, Intracatheter, PRN, Nicholas Lose, MD  Past Medical History: Past Medical History:  Diagnosis Date   Anemia    iron deficiency   Arthritis    Borderline diabetes    Complication of anesthesia    woke up slowly- after hysterectomy- 2015   COVID-19    Diabetes mellitus, type II ([place])    DVT (deep venous thrombosis) ([place]) 2014   left leg   Family history of breast cancer    Gout    HTN (hypertension)    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   Menorrhagia    secondary to uterine fibroids   OSA (obstructive sleep apnea)    07/25/13 HST AHI 33/hr, severe hypoxemia O2 min 42% and 95% of the time <89%   PE (pulmonary embolism) 2014   bilateral   Prediabetes    Pulmonary artery hypertension ([place])    Pulmonary nodule    (28m on loeft lower lobe) found on CT scan July 2014, repeat scan Jan 2015 showed less than 455m  Right ovarian cyst    noted 09/2012    S/P TAH (total abdominal hysterectomy) 06/07/2013   Trichomoniasis    05/2011     Tobacco Use: Social History   Tobacco Use  Smoking Status Former   Packs/day: 0.33   Years: 10.00   Pack years: 3.30   Types: Cigarettes   Quit date: 10/04/2011   Years since quitting: 9.0  Smokeless Tobacco Never    Labs: Recent Review Flowsheet Data     Labs for ITP Cardiac and Pulmonary Rehab Latest Ref Rng & Units 06/22/2017 12/30/2017 12/22/2018 10/05/2019 03/05/2020   Cholestrol 100 - 199 mg/dL 148 - 119 - 190   LDLCALC 0 - 99 mg/dL 86 - 69 - 119(H)   HDL >39 mg/dL 36(L) - 30.00(L) - 52   Trlycerides 0 - 149 mg/dL 128 - 99.0 - 105   Hemoglobin A1c 4.8 - 5.6 % 6.3(H) 6.2 6.4 5.0 5.5   HCO3 20.0 - 28.0 mmol/L - - - - -   TCO2 0 - 100 mmol/L - - - - -   O2SAT % - - - - -       Capillary Blood Glucose: Lab Results  Component Value Date   GLUCAP 119 (H) 09/09/2017   GLUCAP 113 (H) 09/09/2017   GLUCAP 95 09/02/2017   GLUCAP 108 (H) 07/27/2016   GLUCAP 116 (H) 07/27/2016     Pulmonary Assessment Scores:  Pulmonary  Assessment Scores     Row Name 08/16/20 1627         ADL UCSD   ADL Phase Exit     SOB Score total 74           CAT Score     CAT Score 26           mMRC Score     mMRC Score 4  UCSD: Self-administered rating of dyspnea associated with activities of daily living (ADLs) 6-point scale (0 = "not at all" to 5 = "maximal or unable to do because of breathlessness")  Scoring Scores range from 0 to 120.  Minimally important difference is 5 units  CAT: CAT can identify the health impairment of COPD patients and is better correlated with disease progression.  CAT has a scoring range of zero to 40. The CAT score is classified into four groups of low (less than 10), medium (10 - 20), high (21-30) and very high (31-40) based on the impact level of disease on health status. A CAT score over 10 suggests significant symptoms.  A worsening CAT score could be explained by an exacerbation, poor medication adherence, poor inhaler technique, or progression of COPD or comorbid conditions.  CAT MCID is 2 points  mMRC: mMRC (Modified Medical Research Council) Dyspnea Scale is used to assess the degree of baseline functional disability in patients of respiratory disease due to dyspnea. No minimal important difference is established. A decrease in score of 1 point or greater is considered a positive change.   Pulmonary Function Assessment:  Pulmonary Function Assessment - 08/16/20 1125       Breath   Shortness of Breath Yes;Limiting activity             Exercise Target Goals: Exercise Program Goal: Individual exercise prescription set using results from initial 6 min walk test and THRR while considering  patient's activity barriers and safety.   Exercise Prescription Goal: Initial exercise prescription builds to 30-45 minutes a day of aerobic activity, 2-3 days per week.  Home exercise guidelines will be given to patient during program as part of exercise prescription that the  participant will acknowledge.  Activity Barriers & Risk Stratification:  Activity Barriers & Cardiac Risk Stratification - 08/16/20 1121       Activity Barriers & Cardiac Risk Stratification   Activity Barriers Shortness of Breath;Deconditioning             6 Minute Walk:  6 Minute Walk     Row Name 08/16/20 1634         6 Minute Walk   Phase Initial     Distance 1037 feet     Walk Time 6 minutes     # of Rest Breaks 0     MPH 1.96     METS 2.78     RPE 13     Perceived Dyspnea  2.5     VO2 Peak 9.75     Symptoms Yes (comment)     Comments oxygen saturation dropped to 84% at minute 4, had her stop to perform pursed lip breathing and to increase supplemental oxygen. saO2 immediately came back up to 94%     Resting HR 87 bpm     Resting BP 130/74     Resting Oxygen Saturation  100 %     Exercise Oxygen Saturation  during 6 min walk 84 %     Max Ex. HR 110 bpm     Max Ex. BP 140/78     2 Minute Post BP 126/72           Interval HR     1 Minute HR 87     2 Minute HR 89     3 Minute HR 91     4 Minute HR 103     5 Minute HR 104     6 Minute HR 110  2 Minute Post HR 89     Interval Heart Rate? Yes           Interval Oxygen     Interval Oxygen? Yes     Baseline Oxygen Saturation % 100 %     1 Minute Oxygen Saturation % 95 %     1 Minute Liters of Oxygen 6 L     2 Minute Oxygen Saturation % 93 %     2 Minute Liters of Oxygen 6 L     3 Minute Oxygen Saturation % 88 %     3 Minute Liters of Oxygen 6 L     4 Minute Oxygen Saturation % 84 %     4 Minute Liters of Oxygen 6 L  Increased to 8L     5 Minute Oxygen Saturation % 95 %     5 Minute Liters of Oxygen 8 L     6 Minute Oxygen Saturation % 92 %     6 Minute Liters of Oxygen 8 L     2 Minute Post Oxygen Saturation % 100 %     2 Minute Post Liters of Oxygen 6 L             Oxygen Initial Assessment:  Oxygen Initial Assessment - 08/16/20 1122       Home Oxygen   Home Oxygen Device Home  Concentrator;Portable Concentrator    Sleep Oxygen Prescription Continuous;CPAP    Liters per minute 10    Home Exercise Oxygen Prescription Pulsed    Liters per minute 6    Home Resting Oxygen Prescription Pulsed    Liters per minute 4    Compliance with Home Oxygen Use Yes      Initial 6 min Walk   Oxygen Used Continuous    Liters per minute 6   Had to increase to 8L     Program Oxygen Prescription   Program Oxygen Prescription Continuous    Liters per minute 6    Comments Will need 8L if walking but can do 6L for seated exercise      Intervention   Short Term Goals To learn and exhibit compliance with exercise, home and travel O2 prescription;To learn and understand importance of monitoring SPO2 with pulse oximeter and demonstrate accurate use of the pulse oximeter.;To learn and understand importance of maintaining oxygen saturations>88%;To learn and demonstrate proper pursed lip breathing techniques or other breathing techniques. ;To learn and demonstrate proper use of respiratory medications    Long  Term Goals Exhibits compliance with exercise, home  and travel O2 prescription;Verbalizes importance of monitoring SPO2 with pulse oximeter and return demonstration;Maintenance of O2 saturations>88%;Exhibits proper breathing techniques, such as pursed lip breathing or other method taught during program session;Compliance with respiratory medication;Demonstrates proper use of MDI's             Oxygen Re-Evaluation:  Oxygen Re-Evaluation     Row Name 09/16/20 0734 10/14/20 1153           Program Oxygen Prescription   Program Oxygen Prescription Continuous Continuous      Liters per minute 6 6      Comments -- Needs 8L for walking             Home Oxygen      Home Oxygen Device Home Concentrator;Portable Concentrator Home Concentrator;Portable Concentrator      Sleep Oxygen Prescription Continuous;CPAP Continuous;CPAP      Liters per minute 10 10  Home Exercise Oxygen  Prescription Pulsed Pulsed      Liters per minute 6 6      Home Resting Oxygen Prescription Pulsed Pulsed      Liters per minute 4 4      Compliance with Home Oxygen Use Yes Yes             Goals/Expected Outcomes      Short Term Goals To learn and exhibit compliance with exercise, home and travel O2 prescription;To learn and understand importance of monitoring SPO2 with pulse oximeter and demonstrate accurate use of the pulse oximeter.;To learn and understand importance of maintaining oxygen saturations>88%;To learn and demonstrate proper pursed lip breathing techniques or other breathing techniques. ;To learn and demonstrate proper use of respiratory medications To learn and exhibit compliance with exercise, home and travel O2 prescription;To learn and understand importance of monitoring SPO2 with pulse oximeter and demonstrate accurate use of the pulse oximeter.;To learn and understand importance of maintaining oxygen saturations>88%;To learn and demonstrate proper pursed lip breathing techniques or other breathing techniques. ;To learn and demonstrate proper use of respiratory medications      Long  Term Goals Exhibits compliance with exercise, home  and travel O2 prescription;Verbalizes importance of monitoring SPO2 with pulse oximeter and return demonstration;Maintenance of O2 saturations>88%;Exhibits proper breathing techniques, such as pursed lip breathing or other method taught during program session;Compliance with respiratory medication;Demonstrates proper use of MDI's Exhibits compliance with exercise, home  and travel O2 prescription;Verbalizes importance of monitoring SPO2 with pulse oximeter and return demonstration;Maintenance of O2 saturations>88%;Exhibits proper breathing techniques, such as pursed lip breathing or other method taught during program session;Compliance with respiratory medication;Demonstrates proper use of MDI's      Goals/Expected Outcomes compliance and understanding of  monitoring oxygen saturation and performing pursed lip breathing when necessary. compliance and understanding of monitoring oxygen saturation and performing pursed lip breathing when necessary.              Oxygen Discharge (Final Oxygen Re-Evaluation):  Oxygen Re-Evaluation - 10/14/20 1153       Program Oxygen Prescription   Program Oxygen Prescription Continuous    Liters per minute 6    Comments Needs 8L for walking      Home Oxygen   Home Oxygen Device Home Concentrator;Portable Concentrator    Sleep Oxygen Prescription Continuous;CPAP    Liters per minute 10    Home Exercise Oxygen Prescription Pulsed    Liters per minute 6    Home Resting Oxygen Prescription Pulsed    Liters per minute 4    Compliance with Home Oxygen Use Yes      Goals/Expected Outcomes   Short Term Goals To learn and exhibit compliance with exercise, home and travel O2 prescription;To learn and understand importance of monitoring SPO2 with pulse oximeter and demonstrate accurate use of the pulse oximeter.;To learn and understand importance of maintaining oxygen saturations>88%;To learn and demonstrate proper pursed lip breathing techniques or other breathing techniques. ;To learn and demonstrate proper use of respiratory medications    Long  Term Goals Exhibits compliance with exercise, home  and travel O2 prescription;Verbalizes importance of monitoring SPO2 with pulse oximeter and return demonstration;Maintenance of O2 saturations>88%;Exhibits proper breathing techniques, such as pursed lip breathing or other method taught during program session;Compliance with respiratory medication;Demonstrates proper use of MDI's    Goals/Expected Outcomes compliance and understanding of monitoring oxygen saturation and performing pursed lip breathing when necessary.  Initial Exercise Prescription:  Initial Exercise Prescription - 08/19/20 1400       Date of Initial Exercise RX and Referring Provider    Date 08/19/20    Referring Provider Dr. Harrietta Guardian    Expected Discharge Date 10/17/20      Oxygen   Oxygen Continuous    Liters 6-8      NuStep   Level 1    SPM 80    Minutes 15      Track   Minutes 15      Prescription Details   Frequency (times per week) 2    Duration Progress to 30 minutes of continuous aerobic without signs/symptoms of physical distress      Intensity   THRR 40-80% of Max Heartrate 66-133    Ratings of Perceived Exertion 11-13    Perceived Dyspnea 0-4      Progression   Progression Continue to progress workloads to maintain intensity without signs/symptoms of physical distress.      Resistance Training   Training Prescription Yes    Weight Red bands    Reps 10-15             Perform Capillary Blood Glucose checks as needed.  Exercise Prescription Changes:   Exercise Prescription Changes     Row Name 08/27/20 1100 09/10/20 1200 09/19/20 1152 10/08/20 1200 10/08/20 1600     Response to Exercise   Blood Pressure (Admit) 136/78 116/82 128/84 144/82 --   Blood Pressure (Exercise) 140/66 124/70 -- 134/64 --   Blood Pressure (Exit) 116/78 100/64 120/70 122/64 --   Heart Rate (Admit) 118 bpm 96 bpm 93 bpm 97 bpm --   Heart Rate (Exercise) 93 bpm 91 bpm 116 bpm 128 bpm --   Heart Rate (Exit) 83 bpm 100 bpm 87 bpm 87 bpm --   Oxygen Saturation (Admit) 98 % 99 % 100 % 98 % --   Oxygen Saturation (Exercise) 98 % 98 % 93 % 96 % --   Oxygen Saturation (Exit) 100 % 100 % 100 % 100 % --   Rating of Perceived Exertion (Exercise) _0 --   Perceived Dyspnea (Exercise) _1 1.5 --   Duration Continue with 30 min of aerobic exercise without signs/symptoms of physical distress. Continue with 30 min of aerobic exercise without signs/symptoms of physical distress. Continue with 30 min of aerobic exercise without signs/symptoms of physical distress. Continue with 30 min of aerobic exercise without signs/symptoms of physical distress. --   Intensity  Other (comment)  40-80% HR Max THRR unchanged THRR unchanged THRR unchanged --     Progression   Progression Continue to progress workloads to maintain intensity without signs/symptoms of physical distress. Continue to progress workloads to maintain intensity without signs/symptoms of physical distress. Continue to progress workloads to maintain intensity without signs/symptoms of physical distress. Continue to progress workloads to maintain intensity without signs/symptoms of physical distress. --     Horticulturist, commercial Prescription Yes Yes Yes Yes --   Weight Red bands Red bands Red bands Red bands --   Reps 10-15 10-15 10-15 10-15 --   Time 10 Minutes 10 Minutes 10 Minutes 10 Minutes --     Oxygen   Oxygen Continuous Continuous Continuous Continuous --   Liters 6 6-_2 --     Bike   Level -- 1 -- -- --   Minutes -- 15 -- -- --   METs -- 1.4 -- -- --  NuStep   Level _0 --   SPM 80 80 80 80 --   Minutes _1 --   METs 1.6 1.6 1.9 1.5 --     Arm/Foot Ergometer   Level -- -- 1 -- --   Minutes -- -- 15 -- --   METs -- -- 1.3 -- --     Track   Laps -- -- -- 11 --   Minutes -- -- -- 15 --   METs -- -- -- 2.28 --     Home Exercise Plan   Plans to continue exercise at -- -- -- -- Home (comment)  Walking and resistance band exercises   Frequency -- -- -- -- Add 2 additional days to program exercise sessions.   Initial Home Exercises Provided -- -- -- -- 10/08/20            Exercise Comments:   Exercise Comments     Row Name 08/20/20 1503 10/08/20 1623         Exercise Comments Patient completed first day of exercise and tolerated well with no complaints or concerns. Will continue to monitor. Completed home exercise prescription with pt. Pt is not currently exercising at home.I encouraged her to walk and to do the resistance band exercises at least 2 additional days a week. Pt stated she would try. Will continue to follow up on home exercise  prescription.               Exercise Goals and Review:   Exercise Goals     Row Name 08/16/20 1625             Exercise Goals   Increase Physical Activity Yes       Intervention Provide advice, education, support and counseling about physical activity/exercise needs.;Develop an individualized exercise prescription for aerobic and resistive training based on initial evaluation findings, risk stratification, comorbidities and participant's personal goals.       Expected Outcomes Short Term: Attend rehab on a regular basis to increase amount of physical activity.;Long Term: Add in home exercise to make exercise part of routine and to increase amount of physical activity.;Long Term: Exercising regularly at least 3-5 days a week.       Increase Strength and Stamina Yes       Intervention Provide advice, education, support and counseling about physical activity/exercise needs.;Develop an individualized exercise prescription for aerobic and resistive training based on initial evaluation findings, risk stratification, comorbidities and participant's personal goals.       Expected Outcomes Short Term: Increase workloads from initial exercise prescription for resistance, speed, and METs.;Short Term: Perform resistance training exercises routinely during rehab and add in resistance training at home;Long Term: Improve cardiorespiratory fitness, muscular endurance and strength as measured by increased METs and functional capacity (6MWT)       Able to understand and use rate of perceived exertion (RPE) scale Yes       Intervention Provide education and explanation on how to use RPE scale       Expected Outcomes Short Term: Able to use RPE daily in rehab to express subjective intensity level;Long Term:  Able to use RPE to guide intensity level when exercising independently       Able to understand and use Dyspnea scale Yes       Intervention Provide education and explanation on how to use Dyspnea scale        Expected Outcomes Short Term: Able to use Dyspnea  scale daily in rehab to express subjective sense of shortness of breath during exertion;Long Term: Able to use Dyspnea scale to guide intensity level when exercising independently       Knowledge and understanding of Target Heart Rate Range (THRR) Yes       Intervention Provide education and explanation of THRR including how the numbers were predicted and where they are located for reference       Expected Outcomes Short Term: Able to state/look up THRR;Long Term: Able to use THRR to govern intensity when exercising independently;Short Term: Able to use daily as guideline for intensity in rehab       Understanding of Exercise Prescription Yes       Intervention Provide education, explanation, and written materials on patient's individual exercise prescription       Expected Outcomes Short Term: Able to explain program exercise prescription;Long Term: Able to explain home exercise prescription to exercise independently                Exercise Goals Re-Evaluation :  Exercise Goals Re-Evaluation     [place] Name 09/16/20 0730 10/14/20 1154           Exercise Goal Re-Evaluation   Exercise Goals Review Increase Physical Activity;Increase Strength and Stamina;Able to understand and use rate of perceived exertion (RPE) scale;Able to understand and use Dyspnea scale;Knowledge and understanding of Target Heart Rate Range (THRR);Understanding of Exercise Prescription Increase Physical Activity;Increase Strength and Stamina;Able to understand and use rate of perceived exertion (RPE) scale;Able to understand and use Dyspnea scale;Knowledge and understanding of Target Heart Rate Range (THRR);Understanding of Exercise Prescription      Comments [name] has completed 8 exercise sessions and has been fairly slow to make progressions. She has been fairly consistent with workload increases but there has not been much progression with MET level increases. She  also has not been wanting to do anything but the Nustep but the last 2 exercise sessions we have encourage her to use the AirDyne and she has tolerated that well so far. She is exercising at 1.7 METS on the Nustep and 1.4 METS on the Airdyne. Will continue to monitor and progress as she is able. [name] has completed 15 exercise sessions and is scheduled to complete the program this week. She made steady progression with workload increases on the Nustep but was slow to make MET level increases. She also got to the point where she refused to walk on thr track or treadmill and only wanted to do that Nustep. We encouraged her to use the AirDyne if she did not want to walk and she has been consistent with progression on that. She is exercising at 1.6 METS on the Nustep. We have discussed home exercise and pt's plan to continue exercise after completion of program. Pt stated she plans to continue exercise at home for now. Have encouraged her to join a gym or local fitness center.      Expected Outcomes Through exercise at rehab and home, the patient will decrease shortness of breath with daily activities and feel confident in carrying out an exercise regimn at home. Through exercise at rehab and home, the patient will decrease shortness of breath with daily activities and feel confident in carrying out an exercise regimn at home.               Discharge Exercise Prescription (Final Exercise Prescription Changes):  Exercise Prescription Changes - 10/08/20 1600       Home Exercise Plan  Plans to continue exercise at Home (comment)   Walking and resistance band exercises   Frequency Add 2 additional days to program exercise sessions.    Initial Home Exercises Provided 10/08/20             Nutrition:  Target Goals: Understanding of nutrition guidelines, daily intake of sodium <1544m, cholesterol <2053m calories 30% from fat and 7% or less from saturated fats, daily to have 5 or more servings of  fruits and vegetables.  Biometrics:  Pre Biometrics - 08/16/20 1625       Pre Biometrics   Grip Strength 25.5 kg              Nutrition Therapy Plan and Nutrition Goals:  Nutrition Therapy & Goals - 09/17/20 0652       Nutrition Therapy   Diet TLC      Personal Nutrition Goals   Nutrition Goal Pt to read labels to identify high sodium foods and reduce intake    Personal Goal #2 Pt to limit fluids by choosing popsicles, hard candy, and lemon water to reduce dry mouth discomfort    Personal Goal #3 Pt to identify food quantities necessary to achieve weight loss of 6-24 lb at graduation from cardiac rehab.      Intervention Plan   Intervention Nutrition handout(s) given to patient.;Prescribe, educate and counsel regarding individualized specific dietary modifications aiming towards targeted core components such as weight, hypertension, lipid management, diabetes, heart failure and other comorbidities.    Expected Outcomes Short Term Goal: Understand basic principles of dietary content, such as calories, fat, sodium, cholesterol and nutrients.;Long Term Goal: Adherence to prescribed nutrition plan.             Nutrition Assessments:  MEDIFICTS Score Key: ?70 Need to make dietary changes  40-70 Heart Healthy Diet ? 40 Therapeutic Level Cholesterol Diet  Flowsheet Row PULMONARY REHAB OTHER RESPIR[facility] [place] [facility] [place] Total Score on Ad[facility] 54      Picture Your Plate Scores: <4<03nhealthy dietary pattern with much room for improvement. 41-50 Dietary pattern unlikely to meet recommendations for good health and room for improvement. 51-60 More healthful dietary pattern, with some room for improvement.  >60 Healthy dietary pattern, although there may be some specific behaviors that could be improved.    Nutrition Goals Re-Evaluation:  Nutrition Goals Re-Evaluation     [place] 09/17/20 0653 10/14/20  1434           Goals   Current Weight 236 lb 12.4 oz (107.4 kg) 240 lb 15.4 oz (109.3 kg)      Nutrition Goal Pt to read labels to identify high sodium foods and reduce intake Pt to read labels to identify high sodium foods and reduce intake             Personal Goal #2 Re-Evaluation      Personal Goal #2 Pt to limit fluids by choosing popsicles, hard candy, and lemon water to reduce dry mouth discomfort Pt to limit fluids by choosing popsicles, hard candy, and lemon water to reduce dry mouth discomfort             Personal Goal #3 Re-Evaluation      Personal Goal #3 Pt to identify food quantities necessary to achieve weight loss of 6-24 lb at graduation from cardiac rehab. Pt to identify food quantities necessary to achieve weight loss of 6-24 lb at graduation from cardiac rehab.  Nutrition Goals Discharge (Final Nutrition Goals Re-Evaluation):  Nutrition Goals Re-Evaluation - 10/14/20 1434       Goals   Current Weight 240 lb 15.4 oz (109.3 kg)    Nutrition Goal Pt to read labels to identify high sodium foods and reduce intake      Personal Goal #2 Re-Evaluation   Personal Goal #2 Pt to limit fluids by choosing popsicles, hard candy, and lemon water to reduce dry mouth discomfort      Personal Goal #3 Re-Evaluation   Personal Goal #3 Pt to identify food quantities necessary to achieve weight loss of 6-24 lb at graduation from cardiac rehab.             Psychosocial: Target Goals: Acknowledge presence or absence of significant depression and/or stress, maximize coping skills, provide positive support system. Participant is able to verbalize types and ability to use techniques and skills needed for reducing stress and depression.  Initial Review & Psychosocial Screening:  Initial Psych Review & Screening - 08/16/20 1126       Initial Review   Current issues with Current Depression;Current Sleep Concerns   States her depression is because of her illness and  not being able to do things she enjoys. She is not currently on any treatment and said she would not be open to any depression medication     Family Dynamics   Good Support System? Yes   Daughters, family and friends     Barriers   Psychosocial barriers to participate in program The patient should benefit from training in stress management and relaxation.      Screening Interventions   Interventions Encouraged to exercise    Expected Outcomes Long Term goal: The participant improves quality of Life and PHQ9 Scores as seen by post scores and/or verbalization of changes;Long Term Goal: Stressors or current issues are controlled or eliminated.             Quality of Life Scores:  Scores of 19 and below usually indicate a poorer quality of life in these areas.  A difference of  2-3 points is a clinically meaningful difference.  A difference of 2-3 points in the total score of the Quality of Life Index has been associated with significant improvement in overall quality of life, self-image, physical symptoms, and general health in studies assessing change in quality of life.  PHQ-9: Recent Review Flowsheet Data     Depression screen [facility] 2/9 08/16/2020 02/29/2020 10/05/2019 12/22/2018 12/30/2017   Decreased Interest 0 0 0 0 0   Down, Depressed, Hopeless 1 0 0 0 0   PHQ - 2 Score 1 0 0 0 0   Altered sleeping 0 0 - - -   Tired, decreased energy 2 0 - - -   Change in appetite 3 0 - - -   Feeling bad or failure about yourself  1 0 - - -   Trouble concentrating 0 0 - - -   Moving slowly or fidgety/restless 0 0 - - -   Suicidal thoughts 0 0 - - -   PHQ-9 Score - 0 - - -   Difficult doing work/chores Somewhat difficult Not difficult at all - - -      Interpretation of Total Score  Total Score Depression Severity:  1-4 = Minimal depression, 5-9 = Mild depression, 10-14 = Moderate depression, 15-19 = Moderately severe depression, 20-27 = Severe depression   Psychosocial Evaluation and  Intervention:  Psychosocial Evaluation - 08/16/20 1628  Psychosocial Evaluation & Interventions   Comments Pt does show signs of depression and admits she has depression. She states the main reason for her depression is due to her illness and all of the things she is limited to doing because of her mental conditon and having to carrying around supplemental oxygen. She has only been on supplemental oxygen for over a year and had to get disability and can no longer work. She struggles with not being able to do the things she enjoys. Her depression is also affecting her sleep and states her sleep schedule is very off.    Expected Outcomes Improve patients quality of life by decreasing shortness of breath with activities with hopes that pt will be able to do more. Also for pt to find healthy ways to relieve stress and to deal with depression, if she refuses treatment.    Continue Psychosocial Services  Follow up required by counselor             Psychosocial Re-Evaluation:  Psychosocial Re-Evaluation     [place] Name 09/16/20 1416 10/14/20 1007           Psychosocial Re-Evaluation   Current issues with Current Sleep Concerns;Current Depression;Current Stress Concerns Current Depression;Current Sleep Concerns      Comments Attributes her depression to her chronic health issues, does not want to take anti-depressant meds.  Seems very positive in class, seems to be handling her stress in healthy ways. [name] seems to be handling her chronic illness in healthy ways with a positive attitude.  She just enjoyed her granddaughter visiting with her for a month.  She is not open to treatment for depression.      Expected Outcomes For [name] to continue to be positive and learn to relax and handle her stress. For [name] to continue with a positive attitude and enjoy the quality of life that she has at this time.      Interventions Encouraged to attend Pulmonary Rehabilitation for the exercise;Relaxation  education;Stress management education Encouraged to attend Pulmonary Rehabilitation for the exercise;Stress management education;Relaxation education      Continue Psychosocial Services  No Follow up required No Follow up required             Initial Review      Source of Stress Concerns Chronic Illness;Unable to participate in former interests or hobbies;Unable to perform yard/household activities Chronic Illness;Unable to participate in former interests or hobbies;Unable to perform yard/household activities      Comments Her chronic illness is a major stressor. --              Psychosocial Discharge (Final Psychosocial Re-Evaluation):  Psychosocial Re-Evaluation - 10/14/20 1007       Psychosocial Re-Evaluation   Current issues with Current Depression;Current Sleep Concerns    Comments [name] seems to be handling her chronic illness in healthy ways with a positive attitude.  She just enjoyed her granddaughter visiting with her for a month.  She is not open to treatment for depression.    Expected Outcomes For [name] to continue with a positive attitude and enjoy the quality of life that she has at this time.    Interventions Encouraged to attend Pulmonary Rehabilitation for the exercise;Stress management education;Relaxation education    Continue Psychosocial Services  No Follow up required      Initial Review   Source of Stress Concerns Chronic Illness;Unable to participate in former interests or hobbies;Unable to perform yard/household activities  Education: Education Goals: Education classes will be provided on a weekly basis, covering required topics. Participant will state understanding/return demonstration of topics presented.  Learning Barriers/Preferences:  Learning Barriers/Preferences - 08/16/20 1130       Learning Barriers/Preferences   Learning Barriers Sight   Wears glasses   Learning Preferences Written Material;Skilled Demonstration;Individual  Instruction             Education Topics: Risk Factor Reduction:  -Group instruction that is supported by a PowerPoint presentation. Instructor discusses the definition of a risk factor, different risk factors for pulmonary disease, and how the heart and lungs work together.   Flowsheet Row PULMONARY REHAB OTHER RESPIRATORY from 10/10/2020 in [place]  Date 09/19/20  Educator handout       Nutrition for Pulmonary Patient:  -Group instruction provided by PowerPoint slides, verbal discussion, and written materials to support subject matter. The instructor gives an explanation and review of healthy diet recommendations, which includes a discussion on weight management, recommendations for fruit and vegetable consumption, as well as protein, fluid, caffeine, fiber, sodium, sugar, and alcohol. Tips for eating when patients are short of breath are discussed. Flowsheet Row PULMONARY REHAB OTHER RESPIRATORY from 10/10/2020 in [place]  Date 09/12/20  Educator H/O  Instruction Review Code 1- Verbalizes Understanding       Pursed Lip Breathing:  -Group instruction that is supported by demonstration and informational handouts. Instructor discusses the benefits of pursed lip and diaphragmatic breathing and detailed demonstration on how to preform both.     Oxygen Safety:  -Group instruction provided by PowerPoint, verbal discussion, and written material to support subject matter. There is an overview of "What is Oxygen" and "Why do we need it".  Instructor also reviews how to create a safe environment for oxygen use, the importance of using oxygen as prescribed, and the risks of noncompliance. There is a brief discussion on traveling with oxygen and resources the patient may utilize. Flowsheet Row PULMONARY REHAB OTHER RESPIRATORY from 10/10/2020 in [place]  Date 09/05/20  Educator Handout        Oxygen Equipment:  -Group instruction provided by [facility] Staff utilizing handouts, written materials, and equipment demonstrations.   Signs and Symptoms:  -Group instruction provided by written material and verbal discussion to support subject matter. Warning signs and symptoms of infection, stroke, and heart attack are reviewed and when to call the physician/911 reinforced. Tips for preventing the spread of infection discussed.   Advanced Directives:  -Group instruction provided by verbal instruction and written material to support subject matter. Instructor reviews Advanced Directive laws and proper instruction for filling out document.   Pulmonary Video:  -Group video education that reviews the importance of medication and oxygen compliance, exercise, good nutrition, pulmonary hygiene, and pursed lip and diaphragmatic breathing for the pulmonary patient.   Exercise for the Pulmonary Patient:  -Group instruction that is supported by a PowerPoint presentation. Instructor discusses benefits of exercise, core components of exercise, frequency, duration, and intensity of an exercise routine, importance of utilizing pulse oximetry during exercise, safety while exercising, and options of places to exercise outside of rehab.   Flowsheet Row PULMONARY REHAB OTHER RESPIRATORY from 10/10/2020 in [place]  Date 10/10/20  Educator Handout       Pulmonary Medications:  -Verbally interactive group education provided by instructor with focus on inhaled medications and proper administration.   Anatomy and  Physiology of the Respiratory System and Intimacy:  -Group instruction provided by PowerPoint, verbal discussion, and written material to support subject matter. Instructor reviews respiratory cycle and anatomical components of the respiratory system and their functions. Instructor also reviews differences in obstructive and restrictive respiratory  diseases with examples of each. Intimacy, Sex, and Sexuality differences are reviewed with a discussion on how relationships can change when diagnosed with pulmonary disease. Common sexual concerns are reviewed. Flowsheet Row PULMONARY REHAB OTHER RESPIRATORY from 10/10/2020 in [place]  Date 08/29/20  Educator Jess-H/O  Instruction Review Code 1- Verbalizes Understanding       MD DAY -A group question and answer session with a medical doctor that allows participants to ask questions that relate to their pulmonary disease state.   OTHER EDUCATION -Group or individual verbal, written, or video instructions that support the educational goals of the pulmonary rehab program. [place] from 10/10/2020 in [place]  Date 10/03/20  [facility] Your Numbers]  Educator handout       Holiday Eating Survival Tips:  -Group instruction provided by PowerPoint slides, verbal discussion, and written materials to support subject matter. The instructor gives patients tips, tricks, and techniques to help them not only survive but enjoy the holidays despite the onslaught of food that accompanies the holidays.   Knowledge Questionnaire Score:  Knowledge Questionnaire Score - 08/16/20 1641       Knowledge Questionnaire Score   Pre Score 12/18             Core Components/Risk Factors/Patient Goals at Admission:  Personal Goals and Risk Factors at Admission - 08/16/20 1632       Core Components/Risk Factors/Patient Goals on Admission    Weight Management Yes;Obesity    Intervention Weight Management: Develop a combined nutrition and exercise program designed to reach desired caloric intake, while maintaining appropriate intake of nutrient and fiber, sodium and fats, and appropriate energy expenditure required for the weight goal.;Weight Management: Provide education and appropriate resources to help  participant work on and attain dietary goals.;Weight Management/Obesity: Establish reasonable short term and long term weight goals.    Improve shortness of breath with ADL's Yes    Intervention Provide education, individualized exercise plan and daily activity instruction to help decrease symptoms of SOB with activities of daily living.    Expected Outcomes Short Term: Improve cardiorespiratory fitness to achieve a reduction of symptoms when performing ADLs;Long Term: Be able to perform more ADLs without symptoms or delay the onset of symptoms             Core Components/Risk Factors/Patient Goals Review:   Goals and Risk Factor Review     Row Name 09/16/20 1420 10/14/20 1014           Core Components/Risk Factors/Patient Goals Review   Personal Goals Review Develop more efficient breathing techniques such as purse lipped breathing and diaphragmatic breathing and practicing self-pacing with activity.;Increase knowledge of respiratory medications and ability to use respiratory devices properly.;Improve shortness of breath with ADL's Develop more efficient breathing techniques such as purse lipped breathing and diaphragmatic breathing and practicing self-pacing with activity.;Increase knowledge of respiratory medications and ability to use respiratory devices properly.;Improve shortness of breath with ADL's      Review Is progressing well on the equipment, is exercising on level 4 on the nustep, and level 1 . Brie is requiring 6 L of supplemental oxygen for seated exercise and 8  L for walking exercise, she has been slow to progress, is on level 5 of the nustep and walking 9-11 laps on the track.  She graduates this week.      Expected Outcomes See admission goals. For [name] to continue exercising at home using the guidelines and teaching we provided her with while in pulmonary rehab.               Core Components/Risk Factors/Patient Goals at Discharge (Final Review):   Goals and  Risk Factor Review - 10/14/20 1014       Core Components/Risk Factors/Patient Goals Review   Personal Goals Review Develop more efficient breathing techniques such as purse lipped breathing and diaphragmatic breathing and practicing self-pacing with activity.;Increase knowledge of respiratory medications and ability to use respiratory devices properly.;Improve shortness of breath with ADL's    Review [name] is requiring 6 L of supplemental oxygen for seated exercise and 8 L for walking exercise, she has been slow to progress, is on level 5 of the nustep and walking 9-11 laps on the track.  She graduates this week.    Expected Outcomes For [name] to continue exercising at home using the guidelines and teaching we provided her with while in pulmonary rehab.             ITP Comments:   Comments:  [name] has completed 15 exercise session in Pulmonary rehab. Pt maintains good attendance and consistent home exercise. Pulmonary rehab staff will continue to monitor and reassess progress toward goals during her participation in Pulmonary Rehab.

## 2020-10-17 ENCOUNTER — Encounter (HOSPITAL_COMMUNITY)
Admission: RE | Admit: 2020-10-17 | Discharge: 2020-10-17 | Disposition: A | Discharge status: Home / Self Care | Payer: 59 | Source: Ambulatory Visit | Attending: Cardiology | Admitting: Cardiology

## 2020-10-17 ENCOUNTER — Other Ambulatory Visit: Payer: Self-pay

## 2020-10-17 DIAGNOSIS — I2721 Secondary pulmonary arterial hypertension: Secondary | ICD-10-CM

## 2020-10-17 DIAGNOSIS — C50212 Malignant neoplasm of upper-inner quadrant of left female breast: Secondary | ICD-10-CM | POA: Diagnosis not present

## 2020-10-17 NOTE — Progress Notes (Signed)
Nutrition Note Spoke with pt to review progress.  Pt did not lose any weight during time in pulmonary rehab. She did reduce sodium intake by reading labels, choosing low sodium foods, and using less salt. She is trying to avoid restaurant foods. Her diet has improved per PYP results. She is following a general healthful diet.  She is motivated to continue working towards her weight loss goals. We reviewed more ways to work towards this goal.  Nutrition Diagnosis   Excessive sodium intake related to over consumption of processed food as evidenced by frequent consumption of convenience food/ canned vegetables, salt shaker use, and seasoning use.   Nutrition Intervention   Pt's individual nutrition plan reviewed with pt. 2 g sodium and 2 L fluid  Continue client-centered nutrition education by RD, as part of interdisciplinary care.   Goal(s) Pt to read labels to identify high sodium foods and reduce intake Pt to limit fluids by choosing popsicles, hard candy, and lemon water to reduce dry mouth discomfort Pt to identify food quantities necessary to achieve weight loss of 6-24 lb at graduation from cardiac rehab.   Plan:  Will provide client-centered nutrition education as part of interdisciplinary care Monitor and evaluate progress toward nutrition goal with team.   Michaele Offer, MS, RDN, LDN, CDCES

## 2020-10-17 NOTE — Progress Notes (Signed)
Daily Session Note  Patient Details  Name: [name] MRN: [id number] Date of Birth: 10/09/1965 Referring Provider:   [name] Pulmonary Rehab Walk Test from 08/16/2020 in [place]  Referring Provider Dr. Harrietta Guardian       Encounter Date: 10/17/2020  Check In:  Session Check In - 10/17/20 1151       Check-In   Supervising physician immediately available to respond to emergencies [facility] immediately available    Physician(s) Dr. Adalberto Ill    Location [facility]    Staff Present Rosebud Poles, RN, Isaac Laud, MS, ACSM-CEP, Exercise Physiologist;Lisa Clarisa Schools, MS, ACSM-CEP, Exercise Physiologist    Virtual Visit No    Medication changes reported     No    Fall or balance concerns reported    No    Tobacco Cessation No Change    Warm-up and Cool-down Performed as group-led instruction    Resistance Training Performed Yes    VAD Patient? No    PAD/SET Patient? No      Pain Assessment   Currently in Pain? No/denies    Multiple Pain Sites No             Capillary Blood Glucose: No results found for this or any previous visit (from the past 24 hour(s)).    Social History   Tobacco Use  Smoking Status Former   Packs/day: 0.33   Years: 10.00   Pack years: 3.30   Types: Cigarettes   Quit date: 10/04/2011   Years since quitting: 9.0  Smokeless Tobacco Never    Goals Met:  Proper associated with RPD/PD & O2 Sat Exercise tolerated well No report of cardiac concerns or symptoms Strength training completed today Completed pulmonary rehab program  Goals Unmet:  Not Applicable  Comments: Service time is from 1315 to 1435 Patient completed pulmonary rehab program today and made improvements with post 6 minute walk test.     Dr. Fransico Him is Medical Director for Cardiac Rehab at [facility].

## 2020-10-25 ENCOUNTER — Other Ambulatory Visit: Payer: Self-pay

## 2020-10-25 ENCOUNTER — Ambulatory Visit (INDEPENDENT_AMBULATORY_CARE_PROVIDER_SITE_OTHER): Payer: 59 | Admitting: Internal Medicine

## 2020-10-25 ENCOUNTER — Encounter: Payer: Self-pay | Admitting: Internal Medicine

## 2020-10-25 VITALS — BP 112/68 | HR 81 | Temp 97.7°F | Ht 65.0 in | Wt 239.4 lb

## 2020-10-25 DIAGNOSIS — N3000 Acute cystitis without hematuria: Secondary | ICD-10-CM

## 2020-10-25 DIAGNOSIS — M109 Gout, unspecified: Secondary | ICD-10-CM

## 2020-10-25 DIAGNOSIS — R748 Abnormal levels of other serum enzymes: Secondary | ICD-10-CM

## 2020-10-25 DIAGNOSIS — I2721 Secondary pulmonary arterial hypertension: Secondary | ICD-10-CM | POA: Diagnosis not present

## 2020-10-25 DIAGNOSIS — Z23 Encounter for immunization: Secondary | ICD-10-CM

## 2020-10-25 DIAGNOSIS — I10 Essential (primary) hypertension: Secondary | ICD-10-CM

## 2020-10-25 DIAGNOSIS — E876 Hypokalemia: Secondary | ICD-10-CM | POA: Diagnosis not present

## 2020-10-25 DIAGNOSIS — E559 Vitamin D deficiency, unspecified: Secondary | ICD-10-CM | POA: Diagnosis not present

## 2020-10-25 DIAGNOSIS — Z0184 Encounter for antibody response examination: Secondary | ICD-10-CM

## 2020-10-25 DIAGNOSIS — K760 Fatty (change of) liver, not elsewhere classified: Secondary | ICD-10-CM

## 2020-10-25 DIAGNOSIS — K219 Gastro-esophageal reflux disease without esophagitis: Secondary | ICD-10-CM | POA: Diagnosis not present

## 2020-10-25 DIAGNOSIS — D696 Thrombocytopenia, unspecified: Secondary | ICD-10-CM | POA: Diagnosis not present

## 2020-10-25 DIAGNOSIS — L309 Dermatitis, unspecified: Secondary | ICD-10-CM

## 2020-10-25 DIAGNOSIS — R7303 Prediabetes: Secondary | ICD-10-CM

## 2020-10-25 LAB — CBC WITH DIFFERENTIAL/PLATELET
Basophils Absolute: 0 10*3/uL (ref 0.0–0.1)
Basophils Relative: 0.3 % (ref 0.0–3.0)
Eosinophils Absolute: 0.1 10*3/uL (ref 0.0–0.7)
Eosinophils Relative: 2.4 % (ref 0.0–5.0)
HCT: 33.3 % — ABNORMAL LOW (ref 36.0–46.0)
Hemoglobin: 11.5 g/dL — ABNORMAL LOW (ref 12.0–15.0)
Lymphocytes Relative: 13.3 % (ref 12.0–46.0)
Lymphs Abs: 0.8 10*3/uL (ref 0.7–4.0)
MCHC: 34.4 g/dL (ref 30.0–36.0)
MCV: 91.5 fl (ref 78.0–100.0)
Monocytes Absolute: 0.2 10*3/uL (ref 0.1–1.0)
Monocytes Relative: 4.1 % (ref 3.0–12.0)
Neutro Abs: 4.9 10*3/uL (ref 1.4–7.7)
Neutrophils Relative %: 79.9 % — ABNORMAL HIGH (ref 43.0–77.0)
Platelets: 131 10*3/uL — ABNORMAL LOW (ref 150.0–400.0)
RBC: 3.64 Mil/uL — ABNORMAL LOW (ref 3.87–5.11)
RDW: 13 % (ref 11.5–15.5)
WBC: 6.1 10*3/uL (ref 4.0–10.5)

## 2020-10-25 LAB — COMPREHENSIVE METABOLIC PANEL
ALT: 10 U/L (ref 0–35)
AST: 12 U/L (ref 0–37)
Albumin: 4.3 g/dL (ref 3.5–5.2)
Alkaline Phosphatase: 93 U/L (ref 39–117)
BUN: 23 mg/dL (ref 6–23)
CO2: 29 mEq/L (ref 19–32)
Calcium: 8.9 mg/dL (ref 8.4–10.5)
Chloride: 101 mEq/L (ref 96–112)
Creatinine, Ser: 0.96 mg/dL (ref 0.40–1.20)
GFR: 66.99 mL/min (ref >60.00)
Glucose, Bld: 119 mg/dL — ABNORMAL HIGH (ref 70–99)
Potassium: 3.5 mEq/L (ref 3.5–5.1)
Sodium: 139 mEq/L (ref 135–145)
Total Bilirubin: 0.8 mg/dL (ref 0.2–1.2)
Total Protein: 7.4 g/dL (ref 6.0–8.3)

## 2020-10-25 LAB — LIPID PANEL
Cholesterol: 177 mg/dL (ref 0–200)
HDL: 49 mg/dL (ref >39.00)
LDL Cholesterol: 105 mg/dL — ABNORMAL HIGH (ref 0–99)
NonHDL: 127.94
Total CHOL/HDL Ratio: 4
Triglycerides: 115 mg/dL (ref 0.0–149.0)
VLDL: 23 mg/dL (ref 0.0–40.0)

## 2020-10-25 LAB — URIC ACID: Uric Acid, Serum: 6.7 mg/dL (ref 2.4–7.0)

## 2020-10-25 LAB — MAGNESIUM: Magnesium: 1.4 mg/dL — ABNORMAL LOW (ref 1.5–2.5)

## 2020-10-25 LAB — GAMMA GT: GGT: 63 U/L — ABNORMAL HIGH (ref 7–51)

## 2020-10-25 LAB — HEMOGLOBIN A1C: Hgb A1c MFr Bld: 5.5 % (ref 4.6–6.5)

## 2020-10-25 LAB — VITAMIN D 25 HYDROXY (VIT D DEFICIENCY, FRACTURES): VITD: 60.62 ng/mL (ref 30.00–100.00)

## 2020-10-25 MED ORDER — OMEPRAZOLE 40 MG PO CPDR
40.0000 mg | DELAYED_RELEASE_CAPSULE | Freq: Every day | ORAL | 3 refills | Status: DC
Start: 2020-10-25 — End: 2021-10-14

## 2020-10-25 MED ORDER — CLOTRIMAZOLE-BETAMETHASONE 1-0.05 % EX CREA: 1.0000 "application " | TOPICAL_CREAM | Freq: Two times a day (BID) | CUTANEOUS | 0 refills | Status: AC

## 2020-10-25 MED ORDER — COLCHICINE 0.6 MG PO CAPS: 1.0000 | ORAL_CAPSULE | ORAL | 11 refills | Status: DC | PRN

## 2020-10-25 NOTE — Progress Notes (Signed)
Chief Complaint  Patient presents with  . Follow-up   F/u  1. Chronic respiratory failure with hypoxia on 4L zo2 continuous f/u Dr. Harrietta Guardian pulmonary pending echo per pulm with h/o PAH. Advised pt she also needs to f/u with [facility] who wanted to see her 6 months since last visit and she at least should f/u cards 6-12 months she needs to sch appt She was having leg edema and diuretic clinic disc. Last pulm appt but Dr. Harrietta Guardian Rx zaroxyln 2.5 1x per week #4 RF x 1  2. She wants letter for massage therapy/jury duty 3. Chronic med conditions: Breast cancer in remission  Gout stable no flares  Htn controlled on lopressor 25 mg bid, spironolactone, torsemide 100 mg with prn zarolyxn 2.5 mg 1x per week #4 RF x 1 per pulm  4. Rash to lower leg at times hyperpigmented and itchy  5. H/o diabetes/prediabetes last A1C 5.5  6. Jerrye Bushy wants ppi prilosec   Review of Systems  Constitutional:  Negative for weight loss.  HENT:  Negative for hearing loss.   Eyes:  Negative for blurred vision.  Respiratory:  Positive for shortness of breath.        Chronic sob stable  Cardiovascular:  Negative for chest pain.  Gastrointestinal:  Negative for abdominal pain.  Musculoskeletal:  Negative for falls and joint pain.  Skin:  Positive for itching and rash.  Neurological:  Negative for dizziness and headaches.  Psychiatric/Behavioral:  Negative for depression.   Past Medical History:  Diagnosis Date  . Anemia    iron deficiency  . Arthritis   . Borderline diabetes   . Complication of anesthesia    woke up slowly- after hysterectomy- 2015  . COVID-19   . Diabetes mellitus, type II ([place])   . DVT (deep venous thrombosis) ([place]) 2014   left leg  . Family history of breast cancer   . Gout   . HTN (hypertension)   . 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 Menorrhagia    secondary to uterine fibroids  . OSA (obstructive sleep apnea)    07/25/13 HST AHI 33/hr,  severe hypoxemia O2 min 42% and 95% of the time <89%  . PE (pulmonary embolism) 2014   bilateral  . Prediabetes   . Pulmonary artery hypertension ([place])   . Pulmonary nodule    (61m on loeft lower lobe) found on CT scan July 2014, repeat scan Jan 2015 showed less than 465m . Right ovarian cyst    noted 09/2012   . S/P TAH (total abdominal hysterectomy) 06/07/2013  . Trichomoniasis    05/2011    Past Surgical History:  Procedure Laterality Date  . ABDOMINAL HYSTERECTOMY N/A 06/07/2013   Procedure: HYSTERECTOMY ABDOMINAL WITH BIALTERAL SALPINGECTOMY;  Surgeon: VaElveria RoyalsMD;  [facility];  Service: Gynecology;  Laterality: N/A;  . BIOPSY  09/02/2017   Procedure: BIOPSY;  Surgeon: ScWilford CornerMD;  [facility];  Service: Endoscopy;;  . BREAST LUMPECTOMY Left 07/27/2016   BREAST LUMPECTOMY WITH RADIOACTIVE SEED AND SENTINEL LYMPH NODE BIOPSY (Left)  . BREAST LUMPECTOMY WITH RADIOACTIVE SEED AND SENTINEL LYMPH NODE BIOPSY Left 07/27/2016   Procedure: BREAST LUMPECTOMY WITH RADIOACTIVE SEED AND SENTINEL LYMPH NODE BIOPSY;  Surgeon: FaStark KleinMD;  [facility] Service: General;  Laterality: Left;  . CARDIAC CATHETERIZATION N/A 12/24/2015   Procedure: Right Heart Cath;  Surgeon: JaAdrian ProwsMD;  [facility];  Service: Cardiovascular;  Laterality: N/A;  . CESAREAN SECTION    . COLONOSCOPY    . COLONOSCOPY WITH PROPOFOL N/A 09/02/2017   Procedure: COLONOSCOPY WITH PROPOFOL;  Surgeon: Wilford Corner, MD;  [facility];  Service: Endoscopy;  Laterality: N/A;  . ESOPHAGOGASTRODUODENOSCOPY (EGD) WITH PROPOFOL N/A 09/02/2017   Procedure: ESOPHAGOGASTRODUODENOSCOPY (EGD) WITH PROPOFOL;  Surgeon: Wilford Corner, MD;  [facility];  Service: Endoscopy;  Laterality: N/A;  . IR CV LINE INJECTION  07/27/2017  . POLYPECTOMY  2008   Removal of uterine polyp  . PORT-A-CATH REMOVAL Right 09/09/2017   Procedure: REMOVAL PORT-A-CATH;  Surgeon:  Stark Klein, MD;  Location: [facility];  Service: General;  Laterality: Right;  . PORTA CATH INSERTION  07/27/2016  . PORTACATH PLACEMENT Right 07/27/2016   Procedure: INSERTION PORT-A-CATH;  Surgeon: Stark Klein, MD;  Location: [facility];  Service: General;  Laterality: Right;  . SUBMUCOSAL INJECTION  09/02/2017   Procedure: SUBMUCOSAL INJECTION;  Surgeon: Wilford Corner, MD;  [facility];  Service: Endoscopy;;  epi injection   Family History  Problem Relation Age of Onset  . Hypertension Mother   . Kidney disease Mother   . Hypertension Father   . Stroke Sister   . Breast cancer Maternal Grandmother        died at 80  . Breast cancer Paternal Grandmother   . Breast cancer Cousin        pat first cousin dx in her 5s  . Hypertension Brother   . Cancer Maternal Aunt        unknown form  . Breast cancer Paternal Aunt   . Colon cancer Other 30       MGMs brother  . Breast cancer Paternal Aunt   . Cancer Other        breast ca in [place]  . Cancer Other        g uncle colon or stomach ca   Social History   Socioeconomic History  . Marital status: Single    Spouse name: Not on file  . Number of children: 1  . Years of education: Not on file  . Highest education level: Not on file  Occupational History  . Not on file  Tobacco Use  . Smoking status: Former    Packs/day: 0.33    Years: 10.00    Pack years: 3.30    Types: Cigarettes    Quit date: 10/04/2011    Years since quitting: 9.0  . Smokeless tobacco: Never  Vaping Use  . Vaping Use: Never used  Substance and Sexual Activity  . Alcohol use: Yes    Alcohol/week: 0.0 standard drinks    Comment: social  . Drug use: No  . Sexual activity: Yes  Other Topics Concern  . Not on file  Social History Narrative   Single, lives alone with her children   Lives in [place]    worked Customer service manager at [employer]    Occupation: [employer] at [employer] and works [employer]   As of 2022 on disability due to pulm  artery htn       Children: boys   Grew up in [place] Determinants of Health   Financial Resource Strain: Not on [employer] Insecurity: Not on file  Transportation Needs: Not on file  Physical Activity: Not on file  Stress: Not on file  Social Connections: Not on file  Intimate Partner Violence: Not on file   Current Meds  Medication Sig  . acetaminophen (TYLENOL) 500 MG tablet Take 1,000 mg by mouth every 6 (six) hours as needed for moderate pain or headache.   . albuterol (VENTOLIN HFA) 108 (90 Base) MCG/ACT inhaler Inhale 1-2 puffs into the lungs every 6 (six) hours as needed for wheezing or shortness of breath.  . allopurinol (ZYLOPRIM) 300 MG tablet TAKE 1 TABLET (300 MG TOTAL) BY MOUTH DAILY. APPT FURTHER REFILLS AFTER 6 MONTHS CALL TO SCHEDULE NOW PLEASE  . anastrozole (ARIMIDEX) 1 MG tablet Take 1 tablet (1 mg total) by mouth daily.  Marland Kitchen azelastine (ASTELIN) 0.1 % nasal spray Place 1 spray into both nostrils 2 (two) times daily. Use in each nostril as directed  . Cholecalciferol 1.25 MG (50000 UT) capsule Take 1 capsule (50,000 Units total) by mouth once a week. d3  . clotrimazole-betamethasone (LOTRISONE) cream Apply 1 application topically 2 (two) times daily. Prn leg dermatitis  . fluticasone (FLONASE) 50 MCG/ACT nasal spray Place into both nostrils daily as needed for allergies or rhinitis.  . Fluticasone-Salmeterol (ADVAIR) 250-50 MCG/DOSE AEPB Inhale 1 puff into the lungs 2 (two) times daily as needed. Rinse mouth  . ibuprofen (ADVIL) 800 MG tablet Take 1 tablet (800 mg total) by mouth every 8 (eight) hours as needed.  Marland Kitchen ipratropium-albuterol (DUONEB) 0.5-2.5 (3) MG/3ML SOLN Take 3 mLs by nebulization every 4 (four) hours as needed.  . loratadine (CLARITIN) 10 MG tablet Take 10 mg by mouth daily.  . Magnesium Cl-Calcium Carbonate (SLOW MAGNESIUM/CALCIUM) 70-117 MG TBEC Take 2 tablets by mouth in the morning and at bedtime.  . metolazone (ZAROXOLYN) 2.5 MG tablet  Take 2.5 mg by mouth once a week. Per Dr. Harrietta Guardian pulmonary  . metoprolol tartrate (LOPRESSOR) 25 MG tablet Take 1 tablet (25 mg total) by mouth 2 (two) times daily.  . Multiple Vitamin (MULTIVITAMIN) tablet Take 1 tablet by mouth daily.  Marland Kitchen omeprazole (PRILOSEC) 40 MG capsule Take 1 capsule (40 mg total) by mouth daily. 30 min before food  . OPSUMIT 10 MG TABS Take 10 mg by mouth daily.  Vladimir Faster Glycol-Propyl Glycol (SYSTANE OP) Place 1 drop into both eyes daily as needed (dry eyes).   . potassium chloride SA (KLOR-CON) 20 MEQ tablet Take 1 tablet (20 mEq total) by mouth daily.  . rivaroxaban (XARELTO) 20 MG TABS tablet Take 20 mg by mouth daily with supper.  . sildenafil (REVATIO) 20 MG tablet Take 1-2 tablets (20-40 mg total) by mouth 3 (three) times daily. As of 07/03/20 taking 20 mg tid per [facility] Dr. Harrietta Guardian  . spironolactone (ALDACTONE) 25 MG tablet Take 25 mg by mouth daily.  Marland Kitchen torsemide (DEMADEX) 100 MG tablet Take 100 mg by mouth daily.   Marland Kitchen treprostinil (REMODULIN) 20 MG/20ML injection 20 ng/kg/min by Continuous infusion (non-IV) route continuous.  . triamcinolone cream (KENALOG) 0.1 % Apply 1 application topically daily as needed (leg discoloration).  . [DISCONTINUED] Colchicine (MITIGARE) 0.6 MG CAPS Take 1 capsule by mouth as needed. As needed gout flare max # of pills 2 in 24 hours   Allergies  Allergen Reactions  . Ampicillin Other (See Comments)    Severe abdominal pain, dizziness (penicillin is okay)  . Amoxicillin Other (See Comments)    LIGHT-HEADED and CLOSE TO "PASSING OUT"  AND ABDOMINAL PAIN Has patient had a PCN reaction causing immediate rash, facial/tongue/throat swelling, SOB or lightheadedness with hypotension: No Has patient had a PCN reaction causing severe rash involving mucus membranes or skin necrosis: No Has patient  had a PCN reaction that required hospitalization No Has patient had a PCN reaction occurring within the last 10 years: No If all of the above  answers are "NO", then may proceed with Cephalosporin use.  . Indomethacin Other (See Comments)    Gastro irritation  . Nsaids     GI ulcers   . Metronidazole Nausea And Vomiting  . Sulfa Antibiotics Rash and Other (See Comments)    Very bad yeast infection   Recent Results (from the past 2160 hour(s))  Comprehensive metabolic panel     Status: Abnormal   Collection Time: 10/25/20 10:20 AM  Result Value Ref Range   Sodium 139 135 - 145 mEq/L   Potassium 3.5 3.5 - 5.1 mEq/L   Chloride 101 96 - 112 mEq/L   CO2 29 19 - 32 mEq/L   Glucose, Bld 119 (H) 70 - 99 mg/dL   BUN 23 6 - 23 mg/dL   Creatinine, Ser 0.96 0.40 - 1.20 mg/dL   Total Bilirubin 0.8 0.2 - 1.2 mg/dL   Alkaline Phosphatase 93 39 - 117 U/L   AST 12 0 - 37 U/L   ALT 10 0 - 35 U/L   Total Protein 7.4 6.0 - 8.3 g/dL   Albumin 4.3 3.5 - 5.2 g/dL   GFR 66.99 >60.00 mL/min    Comment: Calculated using the CKD-EPI Creatinine Equation (2021)   Calcium 8.9 8.4 - 10.5 mg/dL  Lipid panel     Status: Abnormal   Collection Time: 10/25/20 10:20 AM  Result Value Ref Range   Cholesterol 177 0 - 200 mg/dL    Comment: ATP III Classification       Desirable:  < 200 mg/dL               Borderline High:  200 - 239 mg/dL          High:  > = 240 mg/dL   Triglycerides 115.0 0.0 - 149.0 mg/dL    Comment: Normal:  <150 mg/dLBorderline High:  150 - 199 mg/dL   HDL 49.00 >39.00 mg/dL   VLDL 23.0 0.0 - 40.0 mg/dL   LDL Cholesterol 105 (H) 0 - 99 mg/dL   Total CHOL/HDL Ratio 4     Comment:                Men          Women1/2 Average Risk     3.4          3.3Average Risk          5.0          4.42X Average Risk          9.6          7.13X Average Risk          15.0          11.0                       NonHDL 127.94     Comment: NOTE:  Non-HDL goal should be 30 mg/dL higher than patient's LDL goal (i.e. LDL goal of < 70 mg/dL, would have non-HDL goal of < 100 mg/dL)  CBC with Differential/Platelet     Status: Abnormal   Collection Time:  10/25/20 10:20 AM  Result Value Ref Range   WBC 6.1 4.0 - 10.5 K/uL   RBC 3.64 (L) 3.87 - 5.11 Mil/uL   Hemoglobin 11.5 (L) 12.0 - 15.0 g/dL  HCT 33.3 (L) 36.0 - 46.0 %   MCV 91.5 78.0 - 100.0 fl   MCHC 34.4 30.0 - 36.0 g/dL   RDW 13.0 11.5 - 15.5 %   Platelets 131.0 (L) 150.0 - 400.0 K/uL   Neutrophils Relative % 79.9 (H) 43.0 - 77.0 %   Lymphocytes Relative 13.3 12.0 - 46.0 %   Monocytes Relative 4.1 3.0 - 12.0 %   Eosinophils Relative 2.4 0.0 - 5.0 %   Basophils Relative 0.3 0.0 - 3.0 %   Neutro Abs 4.9 1.4 - 7.7 K/uL   Lymphs Abs 0.8 0.7 - 4.0 K/uL   Monocytes Absolute 0.2 0.1 - 1.0 K/uL   Eosinophils Absolute 0.1 0.0 - 0.7 K/uL   Basophils Absolute 0.0 0.0 - 0.1 K/uL  Hemoglobin A1c     Status: None   Collection Time: 10/25/20 10:20 AM  Result Value Ref Range   Hgb A1c MFr Bld 5.5 4.6 - 6.5 %    Comment: Glycemic Control Guidelines for People with Diabetes:Non Diabetic:  <6%Goal of Therapy: <7%Additional Action Suggested:  >8%   Magnesium     Status: Abnormal   Collection Time: 10/25/20 10:20 AM  Result Value Ref Range   Magnesium 1.4 (L) 1.5 - 2.5 mg/dL  Vitamin D (25 hydroxy)     Status: None   Collection Time: 10/25/20 10:20 AM  Result Value Ref Range   VITD 60.62 30.00 - 100.00 ng/mL  Uric acid     Status: None   Collection Time: 10/25/20 10:20 AM  Result Value Ref Range   Uric Acid, Serum 6.7 2.4 - 7.0 mg/dL  Gamma GT     Status: Abnormal   Collection Time: 10/25/20 10:20 AM  Result Value Ref Range   GGT 63 (H) 7 - 51 U/L   Objective  Body mass index is 39.84 kg/m. Wt Readings from Last 3 Encounters:  10/25/20 239 lb 6.4 oz (108.6 kg)  10/15/20 239 lb 14.4 oz (108.8 kg)  10/08/20 240 lb 15.4 oz (109.3 kg)   Temp Readings from Last 3 Encounters:  10/25/20 97.7 F (36.5 C) (Temporal)  10/15/20 97.7 F (36.5 C) (Temporal)  04/03/20 98.1 F (36.7 C) (Oral)   BP Readings from Last 3 Encounters:  10/25/20 112/68  10/15/20 111/63  08/16/20 112/74    Pulse Readings from Last 3 Encounters:  10/25/20 81  10/15/20 100  08/16/20 89    Physical Exam Vitals and nursing note reviewed.  Constitutional:      Appearance: Normal appearance. She is well-developed and well-groomed. She is obese.  HENT:     Head: Normocephalic and atraumatic.  Eyes:     Conjunctiva/sclera: Conjunctivae normal.     Pupils: Pupils are equal, round, and reactive to light.  Cardiovascular:     Rate and Rhythm: Normal rate and regular rhythm.     Heart sounds: Normal heart sounds. No murmur heard. Pulmonary:     Effort: Pulmonary effort is normal.     Breath sounds: Normal breath sounds.  Skin:    General: Skin is warm and dry.  Neurological:     General: No focal deficit present.     Mental Status: She is alert and oriented to person, place, and time. Mental status is at baseline.     Gait: Gait normal.  Psychiatric:        Attention and Perception: Attention and perception normal.        Mood and Affect: Mood and affect normal.  Speech: Speech normal.        Behavior: Behavior normal. Behavior is cooperative.        Thought Content: Thought content normal.        Cognition and Memory: Cognition and memory normal.        Judgment: Judgment normal.    Assessment  Plan  Gastroesophageal reflux disease without esophagitis - Plan: omeprazole (PRILOSEC) 40 MG capsule  Acute cystitis without hematuria - Plan: Urine Culture with h/o check urine for resolution   Prediabetes - Plan: Hemoglobin A1c A1C 5.5   Chronic respiratory failure with hypoxia (HCC) on 4L O2 continuous- Plan: albuterol (VENTOLIN HFA) 108 (90 Base) MCG/ACT inhaler, ipratropium-albuterol (DUONEB) 0.5-2.5 (3) MG/3ML SOLN, Fluticasone-Salmeterol (ADVAIR) 250-50 MCG/DOSE AEPB PAH (pulmonary artery hypertension) (HCC) - Plan: Comprehensive metabolic panel F/u pulm Dr. Harrietta Guardian cont pulm meds  Prn albuterol/advair 250/50, duoneb prn, lopressor 25 mg bid, xarelto 20 mg qd, revatio 20 mg  qd, spironolactone, remodulin has infusion site right chest, zaroxyln 2.5 weekly  Take K and mag per pulmonary Dr. Harrietta Guardian instructions with diuretics  Needs to f/u with cards cc'ed cards to reach out to pt to schedule  Hypertension, controlled - Plan: Comprehensive metabolic panel, Lipid panel, CBC with Differential/Platelet See meds above  Hypokalemia - Plan: Comprehensive metabolic panel  Hypomagnesemia - Plan: Magnesium  Gout, stable - Plan: Uric acid, Colchicine (MITIGARE) 0.6 MG CAPS x 1 dose repeat 1 dose if needed prn flare, alluporinol 300 mg qd   Thrombocytopenia (HCC) - Plan: CBC with Differential/Platelet, Pathologist smear review  Vitamin D deficiency - Plan: Vitamin D (25 hydroxy)  Fatty liver - Plan: Hepatitis A Ab, Total had vaccines 2/2 Immunity status testing - Plan: Hepatitis A Ab, Total  Elevated alkaline phosphatase level - Plan: Gamma GT, Alkaline phosphatase, isoenzymes, with h/o breast cancer consider bone scan cc'ed h/o Dr. Lindi Adie   Eczema, unspecified type - Plan: clotrimazole-betamethasone (LOTRISONE) cream  Pulmonary HTN ([place]) - Plan: albuterol (VENTOLIN HFA) 108 (90 Base) MCG/ACT inhaler, ipratropium-albuterol (DUONEB) 0.5-2.5 (3) MG/3ML SOLN, Fluticasone-Salmeterol (ADVAIR) 250-50 MCG/DOSE AEPB, sildenafil (REVATIO) 20 MG tablet   Vitamin D deficiency - Plan: Cholecalciferol 1.25 MG (50000 UT) capsule     HM Flu utd  MMR immune  Prevnar given today  consider pna 23 and shingrix Hep A/B utd immune hep B -hep A 2/2 consider check immune in future  covid 3/3 pfizer   Tdap utd 06/21/17 Hep C negative 02/26/16    Pap smear -s/p TAH (w/o cervix only ovaries intact) DUB 2/2 fibroids, adenomyosis -follows with Dr. Mody[facility] pap neg 08/2008 last saw 01/2017 Dr. Benjie Karvonen -obtained records 04/14/17 visit    DEXA [facility] 06/28/17 normal    h/o breast cancer left  mammo 03/29/20 negative [facility] f/u h/o Dr. Lindi Adie 10/15/2020  mammo [facility] 07/05/2018 abnormal  with new right breast cal. H/o left breast cancer  -breast bx 09/27/18 right breast negative   -f/u [facility] h/o due in 01/2019 and [place] surgery 03/2019 due to insurance reasons initial care was with h/o (Dr. Renold Genta surgery (Dr. Barry Dienes) in [place] of note  03/29/20 [facility] neg except chronic changes left breast f/u yearly and Dr. Lindi Adie due to see 09/2021   Colonoscopy/EGD had 09/02/17 [facility] Dr. Michail Sermon diverticulosis/hemorrhooids f/u in 10 years   10/05/19 echo as of 10/25/20 echo pending per pulm 1. Left ventricular ejection fraction, by estimation, is 60 to 65%. The  left ventricle has normal function. The left ventricle has no regional  wall motion  abnormalities. Left ventricular diastolic function could not  be evaluated. There is the  interventricular septum is flattened in systole, consistent with right  ventricular pressure overload.   2. Right ventricular systolic function is moderately reduced. The right  ventricular size is severely enlarged. Tricuspid regurgitation signal is  inadequate for assessing PA pressure.   3. Right atrial size was moderately dilated.   4. The mitral valve is grossly normal. No evidence of mitral valve  regurgitation. No evidence of mitral stenosis.   5. The aortic valve was not well visualized. Aortic valve regurgitation  is not visualized. No aortic stenosis is present.   6. The inferior vena cava is dilated in size with <50% respiratory  variability, suggesting right atrial pressure of 15 mmHg.   She is former smoker from mid 78s age 68 to 87 3 cig per day    rec healthy diet and exercise    Specialists pulm [facility] Dr. Harrietta Guardian H/o Dr. Bethanie Dicker [facility] in [place], Dr. Christopher/ Dr. Haroldine Laws Surgery Dr. Barry Dienes Ob/gyn Dr. Benjie Karvonen >Dr. Garwin Brothers       Provider: Dr. Olivia Mackie McLean-[facility]-Internal Medicine

## 2020-10-25 NOTE — Patient Instructions (Addendum)
metOLazone (ZAROXOLYN) 2.5 MG tablet   Take one tablet by mouth once a week PRN (as needed). Take an extra potassium on days when taking metolazone. 4 tablet   1 10/10/2020     Progress Notes - documented in this encounter  Baruch Merl, RN - 10/10/2020 12:33 PM EDT Formatting of this note might be different from the original. Pt reported feeling bloated and a weight gain of 5 lbs. She understood that Dr. Harrietta Guardian may recommend the diuresis clinic. I clarified with Dr. Harrietta Guardian that they had discussed the diuresis clinic however this would be for acute fluid increases not chronic management. Dr. Harrietta Guardian recommends trying metolazone 2.5 mg up to once a week along with an extra potassium when taking metolazone. She also recommends to double the the magnesium she is taking. Pt has been taking 1 tab 2x per day. Instructed to take 2 tabs 2x per day.  I shared this information with [name] and she verbalized understanding. She did confirm that she has been only taking magnesium 1 tab BID. I [facility] met[facility]azone Rx to [facility] and Ad[facility] will let me know if she has difficult getting it. Pneumococcal Conjugate Vaccine (Prevnar 13) Suspension for Injection What is this medication? PNEUMOCOCCAL VACCINE (NEU mo KOK al vak SEEN) is a vaccine used to prevent pneumococcus bacterial infections. These bacteria can cause serious infections like pneumonia, meningitis, and blood infections. This vaccine will lower your chance of getting pneumonia. If you do get pneumonia, it can make your symptoms milder and your illness shorter. This vaccine will not treat an infection and will not cause infection. This vaccine is recommended for infants and youngchildren, adults with certain medical conditions, and adults 69 years or older. This medicine may be used for other purposes; ask your health care provider orpharmacist if you have questions. COMMON BRAND NAME(S): Prevnar, Prevnar 13 What should I tell my care team before I  take this medication? They need to know if you have any of these conditions: bleeding problems fever immune system problems an unusual or allergic reaction to pneumococcal vaccine, diphtheria toxoid, other vaccines, latex, other medicines, foods, dyes, or preservatives pregnant or trying to get pregnant breast-feeding How should I use this medication? This vaccine is for injection into a muscle. It is given by a health careprofessional. A copy of Vaccine Information Statements will be given before each vaccination.Read this sheet carefully each time. The sheet may change frequently. Talk to your pediatrician regarding the use of this medicine in children. While this drug may be prescribed for children as young as 62 weeks old for selectedconditions, precautions do apply. Overdosage: If you think you have taken too much of this medicine contact apoison control center or emergency room at once. NOTE: This medicine is only for you. Do not share this medicine with others. What if I miss a dose? It is important not to miss your dose. Call your doctor or health careprofessional if you are unable to keep an appointment. What may interact with this medication? medicines for cancer chemotherapy medicines that suppress your immune function steroid medicines like prednisone or cortisone This list may not describe all possible interactions. Give your health care provider a list of all the medicines, herbs, non-prescription drugs, or dietary supplements you use. Also tell them if you smoke, drink alcohol, or use illegaldrugs. Some items may interact with your medicine. What should I watch for while using this medication? Mild fever and pain should go away in 3 days or less.  Report any unusualsymptoms to your doctor or health care professional. What side effects may I notice from receiving this medication? Side effects that you should report to your doctor or health care professionalas soon as  possible: allergic reactions like skin rash, itching or hives, swelling of the face, lips, or tongue breathing problems confused fast or irregular heartbeat fever over 102 degrees F seizures unusual bleeding or bruising unusual muscle weakness Side effects that usually do not require medical attention (report to yourdoctor or health care professional if they continue or are bothersome): aches and pains diarrhea fever of 102 degrees F or less headache irritable loss of appetite pain, tender at site where injected trouble sleeping This list may not describe all possible side effects. Call your doctor for medical advice about side effects. You may report side effects to FDA at1-800-FDA-1088. Where should I keep my medication? This does not apply. This vaccine is given in a clinic, pharmacy, doctor'soffice, or other health care setting and will not be stored at home. NOTE: This sheet is a summary. It may not cover all possible information. If you have questions about this medicine, talk to your doctor, pharmacist, orhealth care provider.  2022 Elsevier/Gold Standard (2013-12-21 10:27:27)  Gastroesophageal Reflux Disease, Adult Gastroesophageal reflux (GER) happens when acid from the stomach flows up into the tube that connects the mouth and the stomach (esophagus). Normally, food travels down the esophagus and stays in the stomach to be digested. However, when a person has GER, food and stomach acid sometimes move back up into the esophagus. If this becomes a more serious problem, the person may be diagnosed with a disease called gastroesophageal reflux disease (GERD). GERD occurs when the reflux: Happens often. Causes frequent or severe symptoms. Causes problems such as damage to the esophagus. When stomach acid comes in contact with the esophagus, the acid may cause inflammation in the esophagus. Over time, GERD may create small holes (ulcers) in the lining of the esophagus. What are the  causes? This condition is caused by a problem with the muscle between the esophagus and the stomach (lower esophageal sphincter, or LES). Normally, the LES muscle closes after food passes through the esophagus to the stomach. When the LES is weakened or abnormal, it does not close properly, and that allows food and stomach acid to go back up into theesophagus. The LES can be weakened by certain dietary substances, medicines, and medical conditions, including: Tobacco use. Pregnancy. Having a hiatal hernia. Alcohol use. Certain foods and beverages, such as coffee, chocolate, onions, and peppermint. What increases the risk? You are more likely to develop this condition if you: Have an increased body weight. Have a connective tissue disorder. Take NSAIDs, such as ibuprofen. What are the signs or symptoms? Symptoms of this condition include: Heartburn. Difficult or painful swallowing and the feeling of having a lump in the throat. A bitter taste in the mouth. Bad breath and having a large amount of saliva. Having an upset or bloated stomach and belching. Chest pain. Different conditions can cause chest pain. Make sure you see your health care provider if you experience chest pain. Shortness of breath or wheezing. Ongoing (chronic) cough or a nighttime cough. Wearing away of tooth enamel. Weight loss. How is this diagnosed? This condition may be diagnosed based on a medical history and a physical exam. To determine if you have mild or severe GERD, your health care provider may also monitor how you respond to treatment. You may also have tests, including:  A test to examine your stomach and esophagus with a small camera (endoscopy). A test that measures the acidity level in your esophagus. A test that measures how much pressure is on your esophagus. A barium swallow or modified barium swallow test to show the shape, size, and functioning of your esophagus. How is this treated? Treatment for  this condition may vary depending on how severe your symptoms are. Your health care provider may recommend: Changes to your diet. Medicine. Surgery. The goal of treatment is to help relieve your symptoms and to preventcomplications. Follow these instructions at home: Eating and drinking  Follow a diet as recommended by your health care provider. This may involve avoiding foods and drinks such as: Coffee and tea, with or without caffeine. Drinks that contain alcohol. Energy drinks and sports drinks. Carbonated drinks or sodas. Chocolate and cocoa. Peppermint and mint flavorings. Garlic and onions. Horseradish. Spicy and acidic foods, including peppers, chili powder, curry powder, vinegar, hot sauces, and barbecue sauce. Citrus fruit juices and citrus fruits, such as oranges, lemons, and limes. Tomato-based foods, such as red sauce, chili, salsa, and pizza with red sauce. Fried and fatty foods, such as donuts, french fries, potato chips, and high-fat dressings. High-fat meats, such as hot dogs and fatty cuts of red and white meats, such as rib eye steak, sausage, ham, and bacon. High-fat dairy items, such as whole milk, butter, and cream cheese. Eat small, frequent meals instead of large meals. Avoid drinking large amounts of liquid with your meals. Avoid eating meals during the 2-3 hours before bedtime. Avoid lying down right after you eat. Do not exercise right after you eat.  Lifestyle  Do not use any products that contain nicotine or tobacco. These products include cigarettes, chewing tobacco, and vaping devices, such as e-cigarettes. If you need help quitting, ask your health care provider. Try to reduce your stress by using methods such as yoga or meditation. If you need help reducing stress, ask your health care provider. If you are overweight, reduce your weight to an amount that is healthy for you. Ask your health care provider for guidance about a safe weight loss  goal.  General instructions Pay attention to any changes in your symptoms. Take over-the-counter and prescription medicines only as told by your health care provider. Do not take aspirin, ibuprofen, or other NSAIDs unless your health care provider told you to take these medicines. Wear loose-fitting clothing. Do not wear anything tight around your waist that causes pressure on your abdomen. Raise (elevate) the head of your bed about 6 inches (15 cm). You can use a wedge to do this. Avoid bending over if this makes your symptoms worse. Keep all follow-up visits. This is important. Contact a health care provider if: You have: New symptoms. Unexplained weight loss. Difficulty swallowing or it hurts to swallow. Wheezing or a persistent cough. A hoarse voice. Your symptoms do not improve with treatment. Get help right away if: You have sudden pain in your arms, neck, jaw, teeth, or back. You suddenly feel sweaty, dizzy, or light-headed. You have chest pain or shortness of breath. You vomit and the vomit is green, yellow, or black, or it looks like blood or coffee grounds. You faint. You have stool that is red, bloody, or black. You cannot swallow, drink, or eat. These symptoms may represent a serious problem that is an emergency. Do not wait to see if the symptoms will go away. Get medical help right away. Call your local emergency  services (911 in the [place] Do not drive yourself to the hospital. Summary Gastroesophageal reflux happens when acid from the stomach flows up into the esophagus. GERD is a disease in which the reflux happens often, causes frequent or severe symptoms, or causes problems such as damage to the esophagus. Treatment for this condition may vary depending on how severe your symptoms are. Your health care provider may recommend diet and lifestyle changes, medicine, or surgery. Contact a health care provider if you have new or worsening symptoms. Take over-the-counter and  prescription medicines only as told by your health care provider. Do not take aspirin, ibuprofen, or other NSAIDs unless your health care provider told you to do so. Keep all follow-up visits as told by your health care provider. This is important. This information is not intended to replace advice given to you by your health care provider. Make sure you discuss any questions you have with your healthcare provider. Document Revised: 09/25/2019 Document Reviewed: 09/25/2019 Elsevier Patient Education  2022 [employer] for Gastroesophageal Reflux Disease, Adult When you have gastroesophageal reflux disease (GERD), the foods you eat and your eating habits are very important. Choosing the right foods can help ease the discomfort of GERD. Consider working with a dietitian to help you [employer] choices. What are tips for following this plan? Reading food labels Look for foods that are low in saturated fat. Foods that have less than 5% of daily value (DV) of fat and 0 g of trans fats may help with your symptoms. Cooking Cook foods using methods other than frying. This may include baking, steaming, grilling, or broiling. These are all methods that do not need a lot of fat for cooking. To add flavor, try to use herbs that are low in spice and acidity. Meal planning  Choose healthy foods that are low in fat, such as fruits, vegetables, whole grains, low-fat dairy products, lean meats, fish, and poultry. Eat frequent, small meals instead of three large meals each day. Eat your meals slowly, in a relaxed setting. Avoid bending over or lying down until 2-3 hours after eating. Limit high-fat foods such as fatty meats or fried foods. Limit your intake of fatty foods, such as oils, butter, and shortening. Avoid the following as told by your health care provider: Foods that cause symptoms. These may be different for different people. Keep a food diary to keep track of foods that cause  symptoms. Alcohol. Drinking large amounts of liquid with meals. Eating meals during the 2-3 hours before bed.  Lifestyle Maintain a healthy weight. Ask your health care provider what weight is healthy for you. If you need to lose weight, work with your health care provider to do so safely. Exercise for at least 30 minutes on 5 or more days each week, or as told by your health care provider. Avoid wearing clothes that fit tightly around your waist and chest. Do not use any products that contain nicotine or tobacco. These products include cigarettes, chewing tobacco, and vaping devices, such as e-cigarettes. If you need help quitting, ask your health care provider. Sleep with the head of your bed raised. Use a wedge under the mattress or blocks under the bed frame to raise the head of the bed. Chew sugar-free gum after mealtimes. What foods should I eat?  Eat a healthy, well-balanced diet of fruits, vegetables, whole grains, low-fat dairy products, lean meats, fish, and poultry. Each person is different. Foods that may trigger symptoms in one  person may not trigger any symptoms in another person. Work with your health care provider to identify foods that are safe foryou. The items listed above may not be a complete list of recommended foods and beverages. Contact a dietitian for more information. What foods should I avoid? Limiting some of these foods may help manage the symptoms of GERD. Everyone is different. Consult a dietitian or your health care provider to help youidentify the exact foods to avoid, if any. Fruits Any fruits prepared with added fat. Any fruits that cause symptoms. For some people this may include citrus fruits, such as oranges, grapefruit, pineapple,and lemons. Vegetables Deep-fried vegetables. [place] fries. Any vegetables prepared with added fat. Any vegetables that cause symptoms. For some people, this may include tomatoesand tomato products, chili peppers, onions and  garlic, and horseradish. Grains Pastries or quick breads with added fat. Meats and other proteins High-fat meats, such as fatty beef or pork, hot dogs, ribs, ham, sausage, salami, and bacon. Fried meat or protein, including fried fish and friedchicken. Nuts and nut butters, in large amounts. Dairy Whole milk and chocolate milk. Sour cream. Cream. Ice cream. Cream cheese.Milkshakes. Fats and oils Butter. Margarine. Shortening. Ghee. Beverages Coffee and tea, with or without caffeine. Carbonated beverages. Sodas. Energy drinks. Fruit juice made with acidic fruits, such as orange or grapefruit.Tomato juice. Alcoholic drinks. Sweets and desserts Chocolate and cocoa. Donuts. Seasonings and condiments Pepper. Peppermint and spearmint. Added salt. Any condiments, herbs, or seasonings that cause symptoms. For some people, this may include curry, hotsauce, or vinegar-based salad dressings. The items listed above may not be a complete list of foods and beverages to avoid. Contact a dietitian for more information. Questions to ask your health care provider Diet and lifestyle changes are usually the first steps that are taken to manage symptoms of GERD. If diet and lifestyle changes do not improve your symptoms,talk with your health care provider about taking medicines. Where to find more information International [employer] for Gastrointestinal Disorders: aboutgerd.org Summary When you have gastroesophageal reflux disease (GERD), food and lifestyle choices may be very helpful in easing the discomfort of GERD. Eat frequent, small meals instead of three large meals each day. Eat your meals slowly, in a relaxed setting. Avoid bending over or lying down until 2-3 hours after eating. Limit high-fat foods such as fatty meats or fried foods. This information is not intended to replace advice given to you by your health care provider. Make sure you discuss any questions you have with your healthcare  provider. Document Revised: 09/25/2019 Document Reviewed: 09/25/2019 Elsevier Patient Education  [place].

## 2020-10-28 ENCOUNTER — Other Ambulatory Visit: Payer: Self-pay | Admitting: Hematology and Oncology

## 2020-10-28 ENCOUNTER — Telehealth: Payer: Self-pay

## 2020-10-28 DIAGNOSIS — R748 Abnormal levels of other serum enzymes: Secondary | ICD-10-CM

## 2020-10-28 DIAGNOSIS — K219 Gastro-esophageal reflux disease without esophagitis: Secondary | ICD-10-CM

## 2020-10-28 DIAGNOSIS — N3000 Acute cystitis without hematuria: Secondary | ICD-10-CM

## 2020-10-28 DIAGNOSIS — Z170 Estrogen receptor positive status [ER+]: Secondary | ICD-10-CM

## 2020-10-28 DIAGNOSIS — C50212 Malignant neoplasm of upper-inner quadrant of left female breast: Secondary | ICD-10-CM

## 2020-10-28 NOTE — Progress Notes (Signed)
Discharge Progress Report  Patient Details  Name: [name] MRN: [id number] Date of Birth: [birth year] Referring Provider:   [name] Walk Test from 08/16/2020 in [facility]  Referring Provider Dr. Harrietta Guardian        Number of Visits: 17  Reason for Discharge:  Patient reached a stable level of exercise. Patient independent in their exercise. Patient has met program and personal goals.  Smoking History:  Social History   Tobacco Use  Smoking Status Former   Packs/day: 0.33   Years: 10.00   Pack years: 3.30   Types: Cigarettes   Quit date: 10/04/2011   Years since quitting: 9.0  Smokeless Tobacco Never    Diagnosis:  Secondary pulmonary arterial hypertension ([place])  ADL [facility]:  Pulmonary Assessment Scores     Row Name 08/16/20 1627 10/17/20 1210 10/17/20 1516     ADL [facility]   ADL Phase Exit Exit Exit   SOB Score total 74 -- 70     CAT Score   CAT Score 26 -- 22     mMRC Score   mMRC Score 4 4 --            Initial Exercise Prescription:  Initial Exercise Prescription - 08/19/20 1400       Date of Initial Exercise RX and Referring Provider   Date 08/19/20    Referring Provider Dr. Harrietta Guardian    Expected Discharge Date 10/17/20      Oxygen   Oxygen Continuous    Liters 6-8      NuStep   Level 1    SPM 80    Minutes 15      Track   Minutes 15      Prescription Details   Frequency (times per week) 2    Duration Progress to 30 minutes of continuous aerobic without signs/symptoms of physical distress      Intensity   THRR 40-80% of Max Heartrate 66-133    Ratings of Perceived Exertion 11-13    Perceived Dyspnea 0-4      Progression   Progression Continue to progress workloads to maintain intensity without signs/symptoms of physical distress.      Resistance Training   Training Prescription Yes    Weight Red bands    Reps 10-15             Discharge Exercise Prescription (Final  Exercise Prescription Changes):  Exercise Prescription Changes - 10/08/20 1600       Home Exercise Plan   Plans to continue exercise at Home (comment)   Walking and resistance band exercises   Frequency Add 2 additional days to program exercise sessions.    Initial Home Exercises Provided 10/08/20             Functional Capacity:  6 Minute Walk     Row Name 08/16/20 1634 10/17/20 1512       6 Minute Walk   Phase Initial Discharge    Distance 1037 feet 1291 feet    Distance % Change -- 24.49 %    Distance Feet Change -- 254 ft    Walk Time 6 minutes 6 minutes    # of Rest Breaks 0 0    MPH 1.96 2.45    METS 2.78 3.25    RPE 13 13    Perceived Dyspnea  2.5 2.5    VO2 Peak 9.75 11.38    Symptoms Yes (comment) No    Comments oxygen saturation  dropped to 84% at minute 4, had her stop to perform pursed lip breathing and to increase supplemental oxygen. saO2 immediately came back up to 94% --    Resting HR 87 bpm 83 bpm    Resting BP 130/74 120/74    Resting Oxygen Saturation  100 % 97 %    Exercise Oxygen Saturation  during 6 min walk 84 % 88 %    Max Ex. HR 110 bpm 128 bpm    Max Ex. BP 140/78 124/68    2 Minute Post BP 126/72 116/68         Interval HR      1 Minute HR 87 128    2 Minute HR 89 122    3 Minute HR 91 114    4 Minute HR 103 107    5 Minute HR 104 106    6 Minute HR 110 105    2 Minute Post HR 89 92    Interval Heart Rate? Yes Yes         Interval Oxygen      Interval Oxygen? Yes --    Baseline Oxygen Saturation % 100 % 97 %    1 Minute Oxygen Saturation % 95 % 97 %    1 Minute Liters of Oxygen 6 L 6 L    2 Minute Oxygen Saturation % 93 % 88 %    2 Minute Liters of Oxygen 6 L 6 L    3 Minute Oxygen Saturation % 88 % 89 %    3 Minute Liters of Oxygen 6 L 6 L    4 Minute Oxygen Saturation % 84 % 88 %    4 Minute Liters of Oxygen 6 L  Increased to 8L 6 L    5 Minute Oxygen Saturation % 95 % 90 %    5 Minute Liters of Oxygen 8 L 6 L    6 Minute  Oxygen Saturation % 92 % 89 %    6 Minute Liters of Oxygen 8 L 6 L    2 Minute Post Oxygen Saturation % 100 % 99 %    2 Minute Post Liters of Oxygen 6 L 6 L            Psychological, QOL, Others - Outcomes: PHQ 2/9: Depression screen [facility] 2/9 10/17/2020 08/16/2020 02/29/2020 10/05/2019 12/22/2018  Decreased Interest 0 0 0 0 0  Down, Depressed, Hopeless 0 1 0 0 0  PHQ - 2 Score 0 1 0 0 0  Altered sleeping 0 0 0 - -  Tired, decreased energy 0 2 0 - -  Change in appetite 0 3 0 - -  Feeling bad or failure about yourself  0 1 0 - -  Trouble concentrating 0 0 0 - -  Moving slowly or fidgety/restless 0 0 0 - -  Suicidal thoughts 0 0 0 - -  PHQ-9 Score 0 - 0 - -  Difficult doing work/chores - Somewhat difficult Not difficult at all - -  Some recent data might be hidden    Quality of Life:   Personal Goals: Goals established at orientation with interventions provided to work toward goal.  Personal Goals and Risk Factors at Admission - 08/16/20 1632       Core Components/Risk Factors/Patient Goals on Admission    Weight Management Yes;Obesity    Intervention Weight Management: Develop a combined nutrition and exercise program designed to reach desired caloric intake, while maintaining appropriate intake of nutrient  and fiber, sodium and fats, and appropriate energy expenditure required for the weight goal.;Weight Management: Provide education and appropriate resources to help participant work on and attain dietary goals.;Weight Management/Obesity: Establish reasonable short term and long term weight goals.    Improve shortness of breath with ADL's Yes    Intervention Provide education, individualized exercise plan and daily activity instruction to help decrease symptoms of SOB with activities of daily living.    Expected Outcomes Short Term: Improve cardiorespiratory fitness to achieve a reduction of symptoms when performing ADLs;Long Term: Be able to perform more ADLs without symptoms or  delay the onset of symptoms              Personal Goals Discharge:  Goals and Risk Factor Review     Row Name 09/16/20 1420 10/14/20 1014           Core Components/Risk Factors/Patient Goals Review   Personal Goals Review Develop more efficient breathing techniques such as purse lipped breathing and diaphragmatic breathing and practicing self-pacing with activity.;Increase knowledge of respiratory medications and ability to use respiratory devices properly.;Improve shortness of breath with ADL's Develop more efficient breathing techniques such as purse lipped breathing and diaphragmatic breathing and practicing self-pacing with activity.;Increase knowledge of respiratory medications and ability to use respiratory devices properly.;Improve shortness of breath with ADL's      Review Is progressing well on the equipment, is exercising on level 4 on the nustep, and level 1 . Alasia is requiring 6 L of supplemental oxygen for seated exercise and 8 L for walking exercise, she has been slow to progress, is on level 5 of the nustep and walking 9-11 laps on the track.  She graduates this week.      Expected Outcomes See admission goals. For Saachi to continue exercising at home using the guidelines and teaching we provided her with while in pulmonary rehab.               Exercise Goals and Review:  Exercise Goals     Row Name 08/16/20 1625             Exercise Goals   Increase Physical Activity Yes       Intervention Provide advice, education, support and counseling about physical activity/exercise needs.;Develop an individualized exercise prescription for aerobic and resistive training based on initial evaluation findings, risk stratification, comorbidities and participant's personal goals.       Expected Outcomes Short Term: Attend rehab on a regular basis to increase amount of physical activity.;Long Term: Add in home exercise to make exercise part of routine and to increase amount  of physical activity.;Long Term: Exercising regularly at least 3-5 days a week.       Increase Strength and Stamina Yes       Intervention Provide advice, education, support and counseling about physical activity/exercise needs.;Develop an individualized exercise prescription for aerobic and resistive training based on initial evaluation findings, risk stratification, comorbidities and participant's personal goals.       Expected Outcomes Short Term: Increase workloads from initial exercise prescription for resistance, speed, and METs.;Short Term: Perform resistance training exercises routinely during rehab and add in resistance training at home;Long Term: Improve cardiorespiratory fitness, muscular endurance and strength as measured by increased METs and functional capacity (6MWT)       Able to understand and use rate of perceived exertion (RPE) scale Yes       Intervention Provide education and explanation on how to use RPE scale  Expected Outcomes Short Term: Able to use RPE daily in rehab to express subjective intensity level;Long Term:  Able to use RPE to guide intensity level when exercising independently       Able to understand and use Dyspnea scale Yes       Intervention Provide education and explanation on how to use Dyspnea scale       Expected Outcomes Short Term: Able to use Dyspnea scale daily in rehab to express subjective sense of shortness of breath during exertion;Long Term: Able to use Dyspnea scale to guide intensity level when exercising independently       Knowledge and understanding of Target Heart Rate Range (THRR) Yes       Intervention Provide education and explanation of THRR including how the numbers were predicted and where they are located for reference       Expected Outcomes Short Term: Able to state/look up THRR;Long Term: Able to use THRR to govern intensity when exercising independently;Short Term: Able to use daily as guideline for intensity in rehab        Understanding of Exercise Prescription Yes       Intervention Provide education, explanation, and written materials on patient's individual exercise prescription       Expected Outcomes Short Term: Able to explain program exercise prescription;Long Term: Able to explain home exercise prescription to exercise independently                Exercise Goals Re-Evaluation:  Exercise Goals Re-Evaluation     [place] Name 09/16/20 0730 10/14/20 1154           Exercise Goal Re-Evaluation   Exercise Goals Review Increase Physical Activity;Increase Strength and Stamina;Able to understand and use rate of perceived exertion (RPE) scale;Able to understand and use Dyspnea scale;Knowledge and understanding of Target Heart Rate Range (THRR);Understanding of Exercise Prescription Increase Physical Activity;Increase Strength and Stamina;Able to understand and use rate of perceived exertion (RPE) scale;Able to understand and use Dyspnea scale;Knowledge and understanding of Target Heart Rate Range (THRR);Understanding of Exercise Prescription      Comments Tandy has completed 8 exercise sessions and has been fairly slow to make progressions. She has been fairly consistent with workload increases but there has not been much progression with MET level increases. She also has not been wanting to do anything but the Nustep but the last 2 exercise sessions we have encourage her to use the AirDyne and she has tolerated that well so far. She is exercising at 1.7 METS on the Nustep and 1.4 METS on the Airdyne. Will continue to monitor and progress as she is able. Drema has completed 15 exercise sessions and is scheduled to complete the program this week. She made steady progression with workload increases on the Nustep but was slow to make MET level increases. She also got to the point where she refused to walk on thr track or treadmill and only wanted to do that Nustep. We encouraged her to use the AirDyne if she did not want  to walk and she has been consistent with progression on that. She is exercising at 1.6 METS on the Nustep. We have discussed home exercise and pt's plan to continue exercise after completion of program. Pt stated she plans to continue exercise at home for now. Have encouraged her to join a gym or local fitness center.      Expected Outcomes Through exercise at rehab and home, the patient will decrease shortness of breath with daily activities and  feel confident in carrying out an exercise regimn at home. Through exercise at rehab and home, the patient will decrease shortness of breath with daily activities and feel confident in carrying out an exercise regimn at home.               Nutrition & Weight - Outcomes:  Pre Biometrics - 08/16/20 1625       Pre Biometrics   Grip Strength 25.5 kg             Post Biometrics - 10/17/20 1210        Post  Biometrics   Grip Strength 28 kg             Nutrition:  Nutrition Therapy & Goals - 09/17/20 0652       Nutrition Therapy   Diet TLC      Personal Nutrition Goals   Nutrition Goal Pt to read labels to identify high sodium foods and reduce intake    Personal Goal #2 Pt to limit fluids by choosing popsicles, hard candy, and lemon water to reduce dry mouth discomfort    Personal Goal #3 Pt to identify food quantities necessary to achieve weight loss of 6-24 lb at graduation from cardiac rehab.      Intervention Plan   Intervention Nutrition handout(s) given to patient.;Prescribe, educate and counsel regarding individualized specific dietary modifications aiming towards targeted core components such as weight, hypertension, lipid management, diabetes, heart failure and other comorbidities.    Expected Outcomes Short Term Goal: Understand basic principles of dietary content, such as calories, fat, sodium, cholesterol and nutrients.;Long Term Goal: Adherence to prescribed nutrition plan.             Nutrition  Discharge:   Education Questionnaire Score:  Knowledge Questionnaire Score - 10/17/20 1515       Knowledge Questionnaire Score   Post Score 7/8   Did not fill out back of paper            Goals reviewed with patient; copy given to patient.

## 2020-10-28 NOTE — Telephone Encounter (Signed)
Labs have been reordered.

## 2020-11-01 LAB — ALKALINE PHOSPHATASE, ISOENZYMES
Alkaline Phosphatase: 108 IU/L (ref 44–121)
BONE FRACTION: 28 % (ref 14–68)
INTESTINAL FRAC.: 5 % (ref 0–18)
LIVER FRACTION: 67 % (ref 18–85)

## 2020-11-04 ENCOUNTER — Encounter (HOSPITAL_COMMUNITY): Payer: 59

## 2020-11-07 LAB — HEPATITIS A ANTIBODY, TOTAL: hep A Total Ab: POSITIVE — AB

## 2020-11-08 LAB — URINE CULTURE

## 2020-11-12 LAB — PATHOLOGIST SMEAR REVIEW
Basophils Absolute: 0 10*3/uL (ref 0.0–0.2)
Basos: 0 %
EOS (ABSOLUTE): 0.2 10*3/uL (ref 0.0–0.4)
Eos: 3 %
Hematocrit: 32.7 % — ABNORMAL LOW (ref 34.0–46.6)
Hemoglobin: 11 g/dL — ABNORMAL LOW (ref 11.1–15.9)
Immature Grans (Abs): 0.1 10*3/uL (ref 0.0–0.1)
Immature Granulocytes: 1 %
Lymphocytes Absolute: 1 10*3/uL (ref 0.7–3.1)
Lymphs: 14 %
MCH: 30.9 pg (ref 26.6–33.0)
MCHC: 33.6 g/dL (ref 31.5–35.7)
MCV: 92 fL (ref 79–97)
Monocytes Absolute: 0.4 10*3/uL (ref 0.1–0.9)
Monocytes: 5 %
Neutrophils Absolute: 5.3 10*3/uL (ref 1.4–7.0)
Neutrophils: 77 %
Platelets: 94 10*3/uL — CL (ref 150–450)
RBC: 3.56 x10E6/uL — ABNORMAL LOW (ref 3.77–5.28)
RDW: 13.1 % (ref 11.7–15.4)
WBC: 6.9 10*3/uL (ref 3.4–10.8)

## 2020-11-13 NOTE — Progress Notes (Signed)
Left message to return call.

## 2020-11-14 ENCOUNTER — Other Ambulatory Visit: Payer: Self-pay

## 2020-11-14 ENCOUNTER — Ambulatory Visit (HOSPITAL_COMMUNITY)
Admission: RE | Admit: 2020-11-14 | Discharge: 2020-11-14 | Disposition: A | Discharge status: Home / Self Care | Payer: 59 | Source: Ambulatory Visit | Attending: Pulmonary Disease | Admitting: Pulmonary Disease

## 2020-11-14 DIAGNOSIS — C50919 Malignant neoplasm of unspecified site of unspecified female breast: Secondary | ICD-10-CM | POA: Diagnosis not present

## 2020-11-14 DIAGNOSIS — I10 Essential (primary) hypertension: Secondary | ICD-10-CM | POA: Diagnosis not present

## 2020-11-14 DIAGNOSIS — E119 Type 2 diabetes mellitus without complications: Secondary | ICD-10-CM | POA: Insufficient documentation

## 2020-11-14 DIAGNOSIS — I272 Pulmonary hypertension, unspecified: Secondary | ICD-10-CM | POA: Diagnosis not present

## 2020-11-14 DIAGNOSIS — Z87891 Personal history of nicotine dependence: Secondary | ICD-10-CM | POA: Insufficient documentation

## 2020-11-14 LAB — ECHOCARDIOGRAM COMPLETE
Area-P 1/2: 4.96 cm2
S' Lateral: 4.4 cm

## 2020-11-14 MED ORDER — PERFLUTREN LIPID MICROSPHERE
1.0000 mL | INTRAVENOUS | Status: AC | PRN
  Administered 2020-11-14: 2 mL via INTRAVENOUS
  Filled 2020-11-14: qty 10

## 2020-11-14 NOTE — Progress Notes (Signed)
  Echocardiogram 2D Echocardiogram has been performed.  Matilde Bash 11/14/2020, 11:13 AM

## 2020-11-20 ENCOUNTER — Other Ambulatory Visit: Payer: Self-pay

## 2020-11-20 DIAGNOSIS — C50212 Malignant neoplasm of upper-inner quadrant of left female breast: Secondary | ICD-10-CM

## 2020-11-20 DIAGNOSIS — Z170 Estrogen receptor positive status [ER+]: Secondary | ICD-10-CM

## 2020-11-20 NOTE — Progress Notes (Signed)
Patient Care Team: McLean-Scocuzza, Nino Glow, MD as PCP - General (Internal Medicine) Vonna Drafts, FNP as Nurse Practitioner (Nurse Practitioner) Stark Klein, MD as Consulting Physician (General Surgery) Nicholas Lose, MD as Consulting Physician (Hematology and Oncology) Gery Pray, MD as Consulting Physician (Radiation Oncology) Gardenia Phlegm, NP as Nurse Practitioner (Hematology and Oncology)  DIAGNOSIS:    ICD-10-CM   1. Malignant neoplasm of upper-inner quadrant of left breast in female, estrogen receptor positive ([place])  C50.212    Z17.0       SUMMARY OF ONCOLOGIC HISTORY: Oncology History  Malignant neoplasm of upper-inner quadrant of left breast in female, estrogen receptor positive ([place])  03/04/2016 Initial Diagnosis   Screening detected left breast density posterior medial 1.4 cm by ultrasound axilla negative, grade 3 IDC ER 60%, PR 0%, Ki-67 30%, HER-2 positive ratio 6.04, copy #16; T1 cN0 stage IA clinical stage   03/21/2016 Genetic Testing   NEgative genetic testing on the comprehensive cancer panel and Negative genetic testing for the MSH2 inversion analysis ([place] inversion). The Comprehensive Cancer Panel offered by [facility] includes sequencing and/or deletion duplication testing of the following 32 genes: APC, ATM, AXIN2, BARD1, BMPR1A, BRCA1, BRCA2, BRIP1, CDH1, CDK4, CDKN2A, CHEK2, EPCAM, FANCC, MLH1, MSH2, MSH6, MUTYH, NBN, PALB2, PMS2, POLD1, POLE, PTEN, RAD51C, RAD51D, SCG5/GREM1, SMAD4, STK11, TP53, VHL, and XRCC2.   The report date is March 21, 2016.   07/27/2016 Surgery   Left lumpectomy: IDC grade 2, 2.6 cm, DCIS intermediate grade,0/3 lymph nodes negative, ER 60%, PR 0%, Ki-67 30%, HER-2 positive ratio 6.04, T2 N0 stage II a   09/01/2016 - 11/03/2016 Chemotherapy   Abraxane Herceptin weekly 12 followed by Herceptin maintenance every 3 weeks     11/19/2016 - 01/20/2017 Radiation Therapy   Adjuvant radiation therapy   01/29/2017 -   Anti-estrogen oral therapy   Anastrozole 2.5 mg daily     CHIEF COMPLIANT:  Follow-up of left breast cancer on anastrozole, evaluation of thrombocytopenia and anemia  INTERVAL HISTORY: [name] is a 55 y.o. with above-mentioned history of left breast cancer treated with lumpectomy, adjuvant chemotherapy, and radiation who is currently on anastrozole therapy. She presents to the clinic today for follow-up.  She continues to struggle with pulmonary hypertension.  She completed a pulmonary rehabilitation earlier this year.  She is staying active by exercising regularly.  She uses oxygen 5 L a minute.  Denies any lumps or nodules in the breast.  She is tolerating anastrozole fairly well without any major problems.  Hot flashes are mild. She was sent to [place] for evaluation of thrombocytopenia and anemia.  Her platelet count has dropped to 94.  No clear etiology.  She has not been ill or has not taken any new medications.  She has not noticed any bruising or bleeding.  She did notice couple of episodes of blood per rectum.  ALLERGIES:  is allergic to ampicillin, amoxicillin, indomethacin, nsaids, metronidazole, and sulfa antibiotics.  MEDICATIONS:  Current Outpatient Medications  Medication Sig Dispense Refill   acetaminophen (TYLENOL) 500 MG tablet Take 1,000 mg by mouth every 6 (six) hours as needed for moderate pain or headache.      albuterol (VENTOLIN HFA) 108 (90 Base) MCG/ACT inhaler Inhale 1-2 puffs into the lungs every 6 (six) hours as needed for wheezing or shortness of breath. 18 g 11   allopurinol (ZYLOPRIM) 300 MG tablet TAKE 1 TABLET (300 MG TOTAL) BY MOUTH DAILY. APPT FURTHER REFILLS AFTER 6 MONTHS CALL TO SCHEDULE NOW  PLEASE 90 tablet 1   anastrozole (ARIMIDEX) 1 MG tablet Take 1 tablet (1 mg total) by mouth daily. 90 tablet 3   azelastine (ASTELIN) 0.1 % nasal spray Place 1 spray into both nostrils 2 (two) times daily. Use in each nostril as directed 30 mL 12   Cholecalciferol  1.25 MG (50000 UT) capsule Take 1 capsule (50,000 Units total) by mouth once a week. d3 13 capsule 1   clotrimazole-betamethasone (LOTRISONE) cream Apply 1 application topically 2 (two) times daily. Prn leg dermatitis 45 g 0   Colchicine (MITIGARE) 0.6 MG CAPS Take 1 capsule by mouth as needed. As needed gout flare max # of pills 2 in 24 hours 30 capsule 11   fluticasone (FLONASE) 50 MCG/ACT nasal spray Place into both nostrils daily as needed for allergies or rhinitis.     Fluticasone-Salmeterol (ADVAIR) 250-50 MCG/DOSE AEPB Inhale 1 puff into the lungs 2 (two) times daily as needed. Rinse mouth 60 each 12   ibuprofen (ADVIL) 800 MG tablet Take 1 tablet (800 mg total) by mouth every 8 (eight) hours as needed. 90 tablet 2   ipratropium-albuterol (DUONEB) 0.5-2.5 (3) MG/3ML SOLN Take 3 mLs by nebulization every 4 (four) hours as needed. 360 mL 12   loratadine (CLARITIN) 10 MG tablet Take 10 mg by mouth daily.     Magnesium Cl-Calcium Carbonate (SLOW MAGNESIUM/CALCIUM) 70-117 MG TBEC Take 2 tablets by mouth in the morning and at bedtime.     metolazone (ZAROXOLYN) 2.5 MG tablet Take 2.5 mg by mouth once a week. Per Dr. Harrietta Guardian pulmonary     metoprolol tartrate (LOPRESSOR) 25 MG tablet Take 1 tablet (25 mg total) by mouth 2 (two) times daily. 180 tablet 3   Multiple Vitamin (MULTIVITAMIN) tablet Take 1 tablet by mouth daily.     omeprazole (PRILOSEC) 40 MG capsule Take 1 capsule (40 mg total) by mouth daily. 30 min before food 90 capsule 3   OPSUMIT 10 MG TABS Take 10 mg by mouth daily.  8   Polyethyl Glycol-Propyl Glycol (SYSTANE OP) Place 1 drop into both eyes daily as needed (dry eyes).      potassium chloride SA (KLOR-CON) 20 MEQ tablet Take 1 tablet (20 mEq total) by mouth daily.     rivaroxaban (XARELTO) 20 MG TABS tablet Take 20 mg by mouth daily with supper.     sildenafil (REVATIO) 20 MG tablet Take 1-2 tablets (20-40 mg total) by mouth 3 (three) times daily. As of 07/03/20 taking 20 mg tid per  [facility] Dr. Harrietta Guardian 10 tablet 0   spironolactone (ALDACTONE) 25 MG tablet Take 25 mg by mouth daily.     torsemide (DEMADEX) 100 MG tablet Take 100 mg by mouth daily.      treprostinil (REMODULIN) 20 MG/20ML injection 20 ng/kg/min by Continuous infusion (non-IV) route continuous.     triamcinolone cream (KENALOG) 0.1 % Apply 1 application topically daily as needed (leg discoloration). 454 g 2   No current facility-administered medications for this visit.   Facility-Administered Medications Ordered in Other Visits  Medication Dose Route Frequency Provider Last Rate Last Admin   heparin lock flush 100 unit/mL  500 Units Intracatheter Once PRN Nicholas Lose, MD       sodium chloride flush (NS) 0.9 % injection 10 mL  10 mL Intracatheter PRN Nicholas Lose, MD        PHYSICAL EXAMINATION: ECOG PERFORMANCE STATUS: 1 - Symptomatic but completely ambulatory  Vitals:   11/21/20 1019  BP: 119/71  Pulse: 85  Resp: 19  Temp: 97.6 F (36.4 C)  SpO2: 97%   Filed Weights   11/21/20 1019  Weight: 243 lb 9.6 oz (110.5 kg)    BREAST: No palpable masses or nodules in either right or left breasts. No palpable axillary supraclavicular or infraclavicular adenopathy no breast tenderness or nipple discharge. (exam performed in the presence of a chaperone)  LABORATORY DATA:  I have reviewed the data as listed CMP Latest Ref Rng & Units 10/25/2020 10/25/2020 03/05/2020  Glucose 70 - 99 mg/dL - 119(H) 102(H)  BUN 6 - 23 mg/dL - 23 18  Creatinine 0.40 - 1.20 mg/dL - 0.96 0.91  Sodium 135 - 145 mEq/L - 139 143  Potassium 3.5 - 5.1 mEq/L - 3.5 3.3(L)  Chloride 96 - 112 mEq/L - 101 104  CO2 19 - 32 mEq/L - 29 23  Calcium 8.4 - 10.5 mg/dL - 8.9 8.7  Total Protein 6.0 - 8.3 g/dL - 7.4 7.6  Total Bilirubin 0.2 - 1.2 mg/dL - 0.8 0.8  Alkaline Phos 44 - 121 IU/L 108 93 155(H)  AST 0 - 37 U/L - 12 9  ALT 0 - 35 U/L - 10 7    Lab Results  Component Value Date   WBC 6.9 11/21/2020   HGB 10.4 (L)  11/21/2020   HCT 31.0 (L) 11/21/2020   MCV 93.9 11/21/2020   PLT 93 (L) 11/21/2020   NEUTROABS 5.4 11/21/2020    ASSESSMENT & PLAN:  Malignant neoplasm of upper-inner quadrant of left breast in female, estrogen receptor positive ([place]) Left lumpectomy 07/28/2016: IDC grade 2, 2.6 cm, DCIS intermediate grade,0/3 lymph nodes negative, ER 60%, PR 0%, Ki-67 30%, HER-2 positive ratio 6.04, T2 N0 stage II a   Treatment summary: 1. Adjuvant chemotherapy with Abraxane and Herceptin (cannot take Taxol because she could not receive steroids) weekly 12 followed by every 3 week Herceptin for one year until May 2019 2. followed by adjuvant radiation 11/25/2016-01/05/2017  3. Followed by adjuvant antiestrogen therapy started 01/28/2017 ------------------------------------------------------------------------------------------------------------------------------- Treatment plan: Adjuvant antiestrogen therapy with anastrozole 1 mg daily 01/28/2017 Adjuvant Herceptin completed May 2019   Anastrozole toxicities: Denies any hot flashes arthralgias or myalgias. Pulmonary hypertension: This is probably her biggest problem currently. Recurrent gout   Surveillance: 1. Breast exam 11/22/2018: Benign. 2. mammograms  03/11/2020 at [facility] benign: Breast density category B   Pulmonary hypertension: Seeing pulmonologist at [facility], uses oxygen 24 / 7   Thrombocytopenia and anemia: Patient has had longstanding anemia.  Thrombocytopenia is relatively recent.  Or this is been over the past 3 weeks.  Platelets are stable compared to the last lab check.  I discussed with her the differential diagnosis of thrombocytopenia and we would like to send for immature platelet fraction today.   Immature platelet fraction is low at 0.9.  This indicates that the cause of thrombocytopenia is decreased production.  We will continue to watch and monitor and see the trend.  If it is continuing to decline then we will need to do a bone marrow  biopsy.  Anemia: She has had couple of episodes of blood per rectum.  She had a prior colonoscopy with Dr. Michail Sermon  I sent a message to evaluate her.   I would like to see her monthly for labs and I will see her back in 2 months to see what the trend is regarding her blood counts.   No orders of the defined types were placed in this encounter.  The patient  has a good understanding of the overall plan. she agrees with it. she will call with any problems that may develop before the next visit here.  Total time spent: 20 mins including face to face time and time spent for planning, charting and coordination of care  Rulon Eisenmenger, MD, MPH 11/21/2020  I, Thana Ates, am acting as scribe for Dr. Nicholas Lose.  I have reviewed the above documentation for accuracy and completeness, and I agree with the above.

## 2020-11-20 NOTE — Assessment & Plan Note (Signed)
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 Adjuvant chemotherapy with Abraxane and Herceptin (cannot take Taxol because she could not receive steroids)weekly 12 followed by every 3 week Herceptin for one yearuntil May 2019 2. followed by adjuvant radiation08/29/2018-01/05/2017 3. Followed by adjuvant antiestrogen therapystarted 01/28/2017 ------------------------------------------------------------------------------------------------------------------------------- Treatment plan: Adjuvant antiestrogen therapy withanastrozole 1 mg daily 01/28/2017 Adjuvant Herceptin completed May 2019  Anastrozole toxicities:Denies any hot flashes arthralgias or myalgias. Pulmonary hypertension: This is probably her biggest problem currently. Recurrent gout  Surveillance: 1. Breast exam7/19/2022: Left breast scar tissue in the upper inner aspect of the breast only mammogram can tell if that is completely benign.  We discussed surgical options and I do not recommend surgery because it can lead to poor wound healing. 2.mammograms [facility]/11/2020 [facility] benign: Breast density category B  [facility]  Return to clinic in1 year for follow-up

## 2020-11-21 ENCOUNTER — Inpatient Hospital Stay (HOSPITAL_BASED_OUTPATIENT_CLINIC_OR_DEPARTMENT_OTHER): Payer: 59 | Admitting: Hematology and Oncology

## 2020-11-21 ENCOUNTER — Other Ambulatory Visit: Payer: Self-pay

## 2020-11-21 ENCOUNTER — Inpatient Hospital Stay: Payer: 59 | Attending: Hematology and Oncology

## 2020-11-21 VITALS — BP 119/71 | HR 85 | Temp 97.6°F | Resp 19 | Ht 65.0 in | Wt 243.6 lb

## 2020-11-21 DIAGNOSIS — Z170 Estrogen receptor positive status [ER+]: Secondary | ICD-10-CM | POA: Diagnosis not present

## 2020-11-21 DIAGNOSIS — D509 Iron deficiency anemia, unspecified: Secondary | ICD-10-CM

## 2020-11-21 DIAGNOSIS — D649 Anemia, unspecified: Secondary | ICD-10-CM | POA: Insufficient documentation

## 2020-11-21 DIAGNOSIS — C50212 Malignant neoplasm of upper-inner quadrant of left female breast: Secondary | ICD-10-CM | POA: Diagnosis not present

## 2020-11-21 DIAGNOSIS — Z79899 Other long term (current) drug therapy: Secondary | ICD-10-CM | POA: Diagnosis not present

## 2020-11-21 DIAGNOSIS — Z79811 Long term (current) use of aromatase inhibitors: Secondary | ICD-10-CM | POA: Diagnosis not present

## 2020-11-21 DIAGNOSIS — Z7951 Long term (current) use of inhaled steroids: Secondary | ICD-10-CM | POA: Insufficient documentation

## 2020-11-21 LAB — CMP (CANCER CENTER ONLY)
ALT: 11 U/L (ref 0–44)
AST: 12 U/L — ABNORMAL LOW (ref 15–41)
Albumin: 3.8 g/dL (ref 3.5–5.0)
Alkaline Phosphatase: 97 U/L (ref 38–126)
Anion gap: 11 (ref 5–15)
BUN: 15 mg/dL (ref 6–20)
CO2: 25 mmol/L (ref 22–32)
Calcium: 8.9 mg/dL (ref 8.9–10.3)
Chloride: 104 mmol/L (ref 98–111)
Creatinine: 0.89 mg/dL (ref 0.44–1.00)
GFR, Estimated: >60 mL/min (ref >60)
Glucose, Bld: 116 mg/dL — ABNORMAL HIGH (ref 70–99)
Potassium: 3.7 mmol/L (ref 3.5–5.1)
Sodium: 140 mmol/L (ref 135–145)
Total Bilirubin: 0.7 mg/dL (ref 0.3–1.2)
Total Protein: 7.8 g/dL (ref 6.5–8.1)

## 2020-11-21 LAB — CBC WITH DIFFERENTIAL (CANCER CENTER ONLY)
Abs Immature Granulocytes: 0.04 10*3/uL (ref 0.00–0.07)
Basophils Absolute: 0 10*3/uL (ref 0.0–0.1)
Basophils Relative: 0 %
Eosinophils Absolute: 0.2 10*3/uL (ref 0.0–0.5)
Eosinophils Relative: 2 %
HCT: 31 % — ABNORMAL LOW (ref 36.0–46.0)
Hemoglobin: 10.4 g/dL — ABNORMAL LOW (ref 12.0–15.0)
Immature Granulocytes: 1 %
Lymphocytes Relative: 15 %
Lymphs Abs: 1 10*3/uL (ref 0.7–4.0)
MCH: 31.5 pg (ref 26.0–34.0)
MCHC: 33.5 g/dL (ref 30.0–36.0)
MCV: 93.9 fL (ref 80.0–100.0)
Monocytes Absolute: 0.3 10*3/uL (ref 0.1–1.0)
Monocytes Relative: 4 %
Neutro Abs: 5.4 10*3/uL (ref 1.7–7.7)
Neutrophils Relative %: 78 %
Platelet Count: 93 10*3/uL — ABNORMAL LOW (ref 150–400)
RBC: 3.3 MIL/uL — ABNORMAL LOW (ref 3.87–5.11)
RDW: 14 % (ref 11.5–15.5)
WBC Count: 6.9 10*3/uL (ref 4.0–10.5)
nRBC: 0 % (ref 0.0–0.2)

## 2020-11-21 LAB — IMMATURE PLATELET FRACTION: Immature Platelet Fraction: 0.9 % — ABNORMAL LOW (ref 1.2–8.6)

## 2020-11-25 ENCOUNTER — Telehealth: Payer: Self-pay | Admitting: Hematology and Oncology

## 2020-11-25 NOTE — Telephone Encounter (Signed)
Scheduled per 8/25 los. Called pt and confirmed added appt

## 2020-12-05 ENCOUNTER — Ambulatory Visit (INDEPENDENT_AMBULATORY_CARE_PROVIDER_SITE_OTHER): Payer: 59 | Admitting: Cardiology

## 2020-12-05 ENCOUNTER — Encounter (HOSPITAL_BASED_OUTPATIENT_CLINIC_OR_DEPARTMENT_OTHER): Payer: Self-pay | Admitting: Cardiology

## 2020-12-05 ENCOUNTER — Other Ambulatory Visit: Payer: Self-pay

## 2020-12-05 VITALS — BP 122/74 | HR 86 | Ht 65.0 in | Wt 242.2 lb

## 2020-12-05 DIAGNOSIS — J9611 Chronic respiratory failure with hypoxia: Secondary | ICD-10-CM

## 2020-12-05 DIAGNOSIS — G4733 Obstructive sleep apnea (adult) (pediatric): Secondary | ICD-10-CM

## 2020-12-05 DIAGNOSIS — R0600 Dyspnea, unspecified: Secondary | ICD-10-CM | POA: Diagnosis not present

## 2020-12-05 DIAGNOSIS — R0609 Other forms of dyspnea: Secondary | ICD-10-CM

## 2020-12-05 DIAGNOSIS — I2721 Secondary pulmonary arterial hypertension: Secondary | ICD-10-CM | POA: Diagnosis not present

## 2020-12-05 DIAGNOSIS — Z9989 Dependence on other enabling machines and devices: Secondary | ICD-10-CM

## 2020-12-05 NOTE — Progress Notes (Signed)
Cardiology Office Note:    Date:  12/05/2020   ID:  [name], DOB 07-Aug-1965, MRN [id number]  PCP:  McLean-Scocuzza, Nino Glow, MD  Cardiologist:  Dr. Harrietta Guardian, [facility] pulmonology follows [facility]  Referring MD: McLean-Scocuzza, Olivia Mackie *   CC: follow up  History of Present Illness:    [name] is a 55 y.o. female with a hx of [place] (see RHC below), PE/DVT 2014, OSA, hypertension, type II diabetes, morbid obesity, breast cancer who is seen for follow up today. She was initially seen 04/19/19 as a new consult at the request of McLean-Scocuzza, Olivia Mackie * for the evaluation and management of pulmonary hypertension.  Cardiac history: pulmonary hypertension followed by [facility] pulmonology, Dr. Harrietta Guardian. Plans to continue being seen there, but wished to establish with [facility] since she lives in [place], in case of admission. She has OSA, chronic respiratory failure with hypoxia on home O2, chronic cough followed by pulmonology. At a visit 11/2018 she was noted to have increased weight gain ~30 lbs and LE edema. She was on spironolactone and torsemide for this.   [place] 06/02/18 (see below),  diagnosed with WHO stage 1 PH (PAH) and was being treated with macitentan, sildenafil, and selexipag. There is detailed documentation re: transition to parenteral therapy for her PAH. Recommendation was to start remodulin.   Today: Reviewed most recent note with Dr. Harrietta Guardian dated 09/27/20. 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 On IV therapy with treprostinil 50 ng/kg/min, working on uptitration. Also on sildenafil 40 mg TID and macitentan 10 mg daily. On torsemide and spironolactone for diuretics. On chronic O2 4-6 LPM and trilogy at night. On rivaroxaban for chronic anticoagulation.  Overall main issue is feeling like she hangs on to fluid, especially at the end of the day. Notes in legs at the end of the day, feels that abdomen holds fluid as well. Makes  urine but not sure if she urinates as much as she takes it.  Thinks her weight is up about 20 lbs from where it was a few months ago. On a daily basis, fluctuates widely but average baseline has been stable. Not taking metolazone, only took it twice, feels like it made her kidneys and back hurt.   On torsemide 100 mg daily. Has tried twice a day, does not feel like this helped the issue. Did not take the torsemide and metolazone together. Discussed recommendations on how to take these today. She refuses to take metolazone as she does not want to hurt her kidneys. We discussed that given her weight gain and symptoms, if we cannot get the fluid off and she may continue to retain. She feels like she is 75% of the fluid she was holding before she got admitted previously.   She declines trying metolazone at this time. After significant discussion, she will trial BID torsemide and see if her swelling improves. She feels that IV lasix is what would help her, but she also does not want to come to the hospital. We discussed trying to use oral medications to prevent hospitalization.  She wants to manage with diet, discussed this is an adjunct but is unlikely to be able to manage her fluid without increased diuretic. Watching her diet, avoiding sodium.   She has noticed more limitation in her activity, has had to increase to 5 L/min at baseline. Feels breathing is worse all the time, especially with activity and bending over.  Has mild cramps  in her chest when she has a lot of fluid on. Had tried compression stockings in the past, helps some but prefers to just monitor her legs and elevated them if needed.   No plans per patient at this time for evaluation at [facility]. Her insurance doesn't cover hospitalization for [facility].   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   Menorrhagia    secondary to uterine fibroids   OSA (obstructive sleep apnea)    07/25/13 HST AHI 33/hr, severe hypoxemia O2 min 42% and 95% of the time <89%   PE (pulmonary embolism) 2014   bilateral   Prediabetes    Pulmonary artery hypertension (HCC)    Pulmonary nodule    (39m on loeft lower lobe) found on CT scan July 2014, repeat scan Jan 2015 showed less than 435m  Right ovarian cyst    noted 09/2012    S/P TAH (total abdominal hysterectomy) 06/07/2013   Trichomoniasis    05/2011     Past Surgical History:  Procedure Laterality Date   ABDOMINAL HYSTERECTOMY N/A 06/07/2013   Procedure: HYSTERECTOMY ABDOMINAL WITH BIALTERAL SALPINGECTOMY;  Surgeon: VaElveria RoyalsMD;  [facility];  Service: Gynecology;  Laterality: N/A;   BIOPSY  09/02/2017   Procedure: BIOPSY;  Surgeon: ScWilford CornerMD;  [facility];  Service: Endoscopy;;   BREAST LUMPECTOMY Left 07/27/2016   BREAST LUMPECTOMY WITH RADIOACTIVE SEED AND SENTINEL LYMPH NODE BIOPSY (Left)   BREAST LUMPECTOMY WITH RADIOACTIVE SEED AND SENTINEL LYMPH NODE BIOPSY Left 07/27/2016   Procedure: BREAST LUMPECTOMY WITH RADIOACTIVE SEED AND SENTINEL LYMPH NODE BIOPSY;  Surgeon: FaStark KleinMD;  [facility] Service: General;  Laterality: Left;   CARDIAC CATHETERIZATION N/A 12/24/2015   Procedure: Right Heart Cath;  Surgeon: JaAdrian ProwsMD;  [facility];  Service: Cardiovascular;  Laterality: N/A;   CESAREAN SECTION     COLONOSCOPY     COLONOSCOPY WITH PROPOFOL N/A 09/02/2017   Procedure: COLONOSCOPY WITH PROPOFOL;  Surgeon: ScWilford CornerMD;  [facility];  Service: Endoscopy;  Laterality: N/A;   ESOPHAGOGASTRODUODENOSCOPY (EGD) WITH PROPOFOL N/A  09/02/2017   Procedure: ESOPHAGOGASTRODUODENOSCOPY (EGD) WITH PROPOFOL;  Surgeon: ScWilford CornerMD;  [facility];  Service: Endoscopy;  Laterality: N/A;   IR CV LINE INJECTION  07/27/2017   POLYPECTOMY  2008   Removal of uterine polyp   PORT-A-CATH REMOVAL Right 09/09/2017   Procedure: REMOVAL PORT-A-CATH;  Surgeon: ByStark KleinMD;  [facility] Service: General;  Laterality: Right;   PORTA CATH INSERTION  07/27/2016   PORTACATH PLACEMENT Right 07/27/2016   Procedure: INSERTION PORT-A-CATH;  Surgeon: FaStark KleinMD;  [facility] Service: General;  Laterality: Right;   SUMonsonNJECTION  09/02/2017   Procedure: SUBMUCOSAL INJECTION;  Surgeon: ScWilford CornerMD;  [facility];  Service: Endoscopy;;  epi injection    Current Medications: Current Outpatient Medications on File Prior to Visit  Medication Sig   acetaminophen (TYLENOL)  500 MG tablet Take 1,000 mg by mouth every 6 (six) hours as needed for moderate pain or headache.    albuterol (VENTOLIN HFA) 108 (90 Base) MCG/ACT inhaler Inhale 1-2 puffs into the lungs every 6 (six) hours as needed for wheezing or shortness of breath.   allopurinol (ZYLOPRIM) 300 MG tablet TAKE 1 TABLET (300 MG TOTAL) BY MOUTH DAILY. APPT FURTHER REFILLS AFTER 6 MONTHS CALL TO SCHEDULE NOW PLEASE   anastrozole (ARIMIDEX) 1 MG tablet Take 1 tablet (1 mg total) by mouth daily.   azelastine (ASTELIN) 0.1 % nasal spray Place 1 spray into both nostrils 2 (two) times daily. Use in each nostril as directed   Cholecalciferol 1.25 MG (50000 UT) capsule Take 1 capsule (50,000 Units total) by mouth once a week. d3   clotrimazole-betamethasone (LOTRISONE) cream Apply 1 application topically 2 (two) times daily. Prn leg dermatitis   Colchicine (MITIGARE) 0.6 MG CAPS Take 1 capsule by mouth as needed. As needed gout flare max # of pills 2 in 24 hours   fluticasone (FLONASE) 50 MCG/ACT nasal spray Place into both  nostrils daily as needed for allergies or rhinitis.   Fluticasone-Salmeterol (ADVAIR) 250-50 MCG/DOSE AEPB Inhale 1 puff into the lungs 2 (two) times daily as needed. Rinse mouth   ibuprofen (ADVIL) 800 MG tablet Take 1 tablet (800 mg total) by mouth every 8 (eight) hours as needed.   ipratropium-albuterol (DUONEB) 0.5-2.5 (3) MG/3ML SOLN Take 3 mLs by nebulization every 4 (four) hours as needed.   loratadine (CLARITIN) 10 MG tablet Take 10 mg by mouth daily.   Magnesium Cl-Calcium Carbonate (SLOW MAGNESIUM/CALCIUM) 70-117 MG TBEC Take 2 tablets by mouth in the morning and at bedtime.   metolazone (ZAROXOLYN) 2.5 MG tablet Take 2.5 mg by mouth once a week. Per Dr. Harrietta Guardian pulmonary   metoprolol tartrate (LOPRESSOR) 25 MG tablet Take 1 tablet (25 mg total) by mouth 2 (two) times daily.   Multiple Vitamin (MULTIVITAMIN) tablet Take 1 tablet by mouth daily.   omeprazole (PRILOSEC) 40 MG capsule Take 1 capsule (40 mg total) by mouth daily. 30 min before food   OPSUMIT 10 MG TABS Take 10 mg by mouth daily.   Polyethyl Glycol-Propyl Glycol (SYSTANE OP) Place 1 drop into both eyes daily as needed (dry eyes).    potassium chloride SA (KLOR-CON) 20 MEQ tablet Take 1 tablet (20 mEq total) by mouth daily.   rivaroxaban (XARELTO) 20 MG TABS tablet Take 20 mg by mouth daily with supper.   sildenafil (REVATIO) 20 MG tablet Take 1-2 tablets (20-40 mg total) by mouth 3 (three) times daily. As of 07/03/20 taking 20 mg tid per [facility] Dr. Harrietta Guardian   spironolactone (ALDACTONE) 25 MG tablet Take 25 mg by mouth daily.   torsemide (DEMADEX) 100 MG tablet Take 100 mg by mouth daily.    treprostinil (REMODULIN) 20 MG/20ML injection 20 ng/kg/min by Continuous infusion (non-IV) route continuous.   triamcinolone cream (KENALOG) 0.1 % Apply 1 application topically daily as needed (leg discoloration).   [DISCONTINUED] prochlorperazine (COMPAZINE) 10 MG tablet Take 1 tablet (10 mg total) by mouth every 6 (six) hours as needed  (Nausea or vomiting).   Current Facility-Administered Medications on File Prior to Visit  Medication   heparin lock flush 100 unit/mL   sodium chloride flush (NS) 0.9 % injection 10 mL     Allergies:   Ampicillin, Amoxicillin, Indomethacin, Nsaids, Metronidazole, and Sulfa antibiotics   Social History   Tobacco Use   Smoking status:  Former    Packs/day: 0.33    Years: 10.00    Pack years: 3.30    Types: Cigarettes    Quit date: 10/04/2011    Years since quitting: 9.1   Smokeless tobacco: Never  Vaping Use   Vaping Use: Never used  Substance Use Topics   Alcohol use: Yes    Alcohol/week: 0.0 standard drinks    Comment: social   Drug use: No    Family History: family history includes Breast cancer in her cousin, maternal grandmother, paternal aunt, paternal aunt, and paternal grandmother; Cancer in her maternal aunt and other family members; Colon cancer (age of onset: 79) in an other family member; Hypertension in her brother, father, and mother; Kidney disease in her mother; Stroke in her sister.  ROS:   Please see the history of present illness.  Additional pertinent ROS otherwise unremarkable.  EKGs/Labs/Other Studies Reviewed:    The following studies were reviewed today: [place] 07/02/18: Hemodynamics: Heart rate: 98  Systemic BP: 177/107  Mean arterial pressure: 130  RA a wave: 14 RA v wave: 12 RA mean: 11  RV (s/d/ed): 76/6/14  PA: 80/34  PA mean: 53  PCW a wave: 10 PCW v wave: 11 PCW mean: 8  PA sat: 61%  Arterial sat: 94%  Hemoglobin: 13.8 Body surface area: 2.29m2  Cardiac Output 4.48 L/min (Fick) Cardiac Index 2.15 L/min/m2 (Fick)  Cardiac Output 4.11 L/min (Thermodilution) Cardiac Index 1.97 L/min/m2 (Thermodilution)  PVR: 10 Wood Units/ 803 dyn-sec/cm5 (Fick) SVR: 2130 dyn-sec/cm5 (Fick)   PVR: 10.9 Wood Units/ 876 dyn-sec/cm5 (Thermodilution) SVR: 2320 dyn-sec/cm5 (Thermodilution)    Notes on prior workup from [facility]: [facility] 02/2016 (mPAP 51, PCW 11, PVR 11.6 WU). Previous RHC at OSH 11/2015 (mPAP 55, PCWP 18-20, PVR 6, Ao sat 79%).   * Sildenafil 40 mg TID, since 06/2017 * Selexipag 4046m BID, since 05/2016  * Macitentan 10 mg daily, since 03/2016 - Sildenafil 20 mg TID, 02/2016 to 06/2017, dose increased Reported prior PA angiogram not consistent with CTEPH  Cardiac testing summary:  TTE 05/02/18 (reviewed): LA 37, EF 55% RV dil/HK, tr TR, can't est PASP IVC dil, poor insp collapse TTE 05/2017 (cone): LVEF 60-65% RVH, RV sev dil, mod HK, tr TR, can't est PASP No effusion. IVC dil/poor insp collapse TTE 12/2016 (cone): LA 35, EF 55-60%, LVH (11-12) RV dil/HK, TAPSE 1.6, est PASP "within normal range" No effusion. IVC normal size TTE 07/2016 (cone): LA 36, EF 55-60% RV normal, est PASP "within normal range" No effusion. IVC dil, normal resp variation RHC 06/2016: RA 5, PA 56/22 (36), PCW 9 CO/CI 5.6/2.7 (td), PA sat 67%, PVR 4.8 WU, PAC 2.3 TTE 03/2016: LA 42, EF 60-65% RV dil/HK, TAPSE 18-19, can't est PASP No effusion. Dilated IVC with poor insp collapse. No IV access for bubble RHC/PA gram 02/2016: RA 15, RV 80/11 (17), PA 80/40 (51), PCW 11 RA with little resp variation PCW with large resp variation, end exp 15-16 mean resp cycle (more accurate with obesity) 11 CO/CI 4.3/2.0 (td*), 5.4/2.6 (f), PA 59%, Ao 86% (higher thruout case) PVR 11.6 WU (td) PA gram with minimal luminal irregularities (?RUL decr perfusion) overall not concerning for significant CTED TTE 01/2016 (cone): LA 35, EF 65% RV dilated, TAPSE 19, tr Tr, can't est PASP RHC 11/2015 (cone): RA 16, PA 81/36 (55), PCW 18-20 CO/CI 5.8/2.7 (f), PA sat 58%, Ao sat 79%(?), PVR ~6 Given 5 mg verapamil and 1000 ug NTG without change in pressures  CTA chest 01/2016: No filling defect within the central or segmental PAs.  Marked enlargement of the pulmonary artery trunk, 5 cm Heart size is mildly enlarged with asymmetric enlargement of  RA/RV Prominent right hilar lymph nodes measuring up to 1.6 cm. Nonspecific sub cm mediastinal nodes.  Patchy areas of hazy attenuation could relate to atelectasis versus small airways disease.  4 mm left lower pulmonary nodule  V/Q scan 11/2015: heterogeneous distribution of ventilation  large ventilation defects are noted to the lateral RLL, right upper and the left apex. several large segmental perfusion defects are noted involving the RUL, RLL.  Corresponding ventilation abnormalities noted as above. Intermediate probability for pulmonary embolus.   6MWT 05/19/18: 280 meters. HR 90->130, O2 sat 96% RA->93% RA. Max Borg 6. 6MWT 05/2017: 305 meters. HR 91->136, O2 sat 100% RA->93% RA. Max Borg 4. Exertional oximetry 08/2016: 1 flight, mod pace. O2 sat 95% RA->84% RA 6MWT 08/2016: 320 meters, HR to 119, lowest sat 90% 6MWT 03/2016: 236 meters, HR 78->102, O2 sat 88% Ra, 93% 2L->88% 2l, ->88% 3l, -->95% 4L 6MWT 01/2016: 304 meters. HR 77->102, O2 sat 92-95% RA  Pulmonary Function Testing: Date FEV1 FVC TLC FRC RV DLCO   01/2016 1.74 (71%) 2.16 (71%) 14.2 (61%)  06/2014 1.93 (78%) 2.32 (76%) 16 (62%)    Overnight oximetry 06/2017 (CPAP/O2): low sats, 87% avg, 77% nadir, per patient accidentally on 2LPM instead of 6 LPM, reordered. PSG 06/2013 significant for AHI 33 and severe hypoxemia (sat nadir 42%, almost all night <90%).  Relevant Serologies:  Labs 12/2015: ANA neg, RF/CCP neg, ANCA neg HIV neg   EKG:  EKG is personally reviewed.   12/05/20 NSR with SA, 88 bpm. RAD, RAE, RVH with strain 04/19/19 sinus rhythm with PAC, RAE, RAD, RVH, ST-T changes consistent with strain  Recent Labs: 03/05/2020: NT-Pro BNP 1,504; TSH 1.370 10/25/2020: Magnesium 1.4 11/21/2020: ALT 11; BUN 15; Creatinine 0.89; Hemoglobin 10.4; Platelet Count 93; Potassium 3.7; Sodium 140  Recent Lipid Panel    Component Value Date/Time   CHOL 177 10/25/2020 1020   CHOL 190 03/05/2020 1409   TRIG 115.0 10/25/2020 1020    HDL 49.00 10/25/2020 1020   HDL 52 03/05/2020 1409   CHOLHDL 4 10/25/2020 1020   VLDL 23.0 10/25/2020 1020   LDLCALC 105 (H) 10/25/2020 1020   LDLCALC 119 (H) 03/05/2020 1409    Physical Exam:    VS:  BP 122/74   Pulse 86   Ht _0  (1.651 m)   Wt 242 lb 3.2 oz (109.9 kg)   LMP 05/11/2013   SpO2 91%   BMI 40.30 kg/m     Wt Readings from Last 3 Encounters:  12/05/20 242 lb 3.2 oz (109.9 kg)  11/21/20 243 lb 9.6 oz (110.5 kg)  10/25/20 239 lb 6.4 oz (108.6 kg)    GEN: Well nourished, well developed in no acute distress. O2 by nasal cannula in place HEENT: Normal, moist mucous membranes NECK: JVD to upper neck at 90 degrees CARDIAC: regular rhythm, normal S1 and S2, no rubs or gallops. No murmur. VASCULAR: Radial and DP pulses 2+ bilaterally. No carotid bruits RESPIRATORY:  Distant breath sounds, clear in upper fields, faint crackles at bilateral bases.   ABDOMEN: Soft, non-tender, but mildly distended, no appreciable fluid wave MUSCULOSKELETAL:  Ambulates independently SKIN: Warm and dry, trace bilateral LE edema NEUROLOGIC:  Alert and oriented x 3. No focal neuro deficits noted. PSYCHIATRIC:  Normal affect    ASSESSMENT:    1.  PAH (pulmonary artery hypertension) ([place])   2. Dyspnea on exertion   3. Chronic respiratory failure with hypoxia (HCC)   4. OSA on CPAP     PLAN:    pulmonary hypertension, felt to be WHO class I on remodulin, with possible component of Class 4/CTEPH, followed by Dr. Harrietta Guardian.  Right heart failure Chronic hypoxic respiratory failure on O2 OSA on trilogy -she continues to follow with [facility], receiving excellent care. She has been reticent to increase her diuretics, recommended by Dr. Harrietta Guardian and myself, due to concerns noted above -she is asking for IV lasix rather than changing oral regimen. We discussed that this is a last option, and we try to avoid hospitalization. -she is hemodynamically stable today, but she does endorse weight gain over  the last few months (stable trend recently), increased O2 requirement, abdominal distension, and intermittent LE edema.   -Discussed re-establishing with Dr. Haroldine Laws for advanced heart failure/PAH, she is amenable to this today -if admitted, she would need to be followed by advanced heart failure given remodulin infusion -she is on home O2 for her chronic respiratory failure and CPAP for her OSA -NYHA class 3, as she is winded easily with minor household activities  She is at high risk of decompensation if we are not able to manage her edema as an outpatient. After prolonged discussion, she will try torsemide BID for several days. Declines metolazone. Discussed red flag signs that need immediate presentation to the ER  Plan for follow up: Will ask Dr. Clayborne Dana group to see her in the advanced heart failure clinic.  Total time of encounter: 57 minutes total time of encounter, including 40 minutes spent in face-to-face patient care. This time includes coordination of care and counseling regarding PAH and importance of management of fluid with duiretics. Remainder of non-face-to-face time involved reviewing chart documents/testing relevant to the patient encounter and documentation in the medical record.  Buford Dresser, MD, PhD, [facility]    Medication Adjustments/Labs and Tests Ordered: Current medicines are reviewed at length with the patient today.  Concerns regarding medicines are outlined above.  Orders Placed This Encounter  Procedures   EKG 12-Lead    No orders of the defined types were placed in this encounter.   Patient Instructions  Medication Instructions:  Your Physician recommend you continue on your current medication as directed.    *If you need a refill on your cardiac medications before your next appointment, please call your pharmacy*   Lab Work: None ordered today   Testing/Procedures: None ordered today   Follow-Up: At  [facility], you and your health needs are our priority.  As part of our continuing mission to provide you with exceptional heart care, we have created designated Provider Care Teams.  These Care Teams include your primary Cardiologist (physician) and Advanced Practice Providers (APPs -  Physician Assistants and Nurse Practitioners) who all work together to provide you with the care you need, when you need it.  We recommend signing up for the patient portal called "MyChart".  Sign up information is provided on this After Visit Summary.  MyChart is used to connect with patients for Virtual Visits (Telemedicine).  Patients are able to view lab/test results, encounter notes, upcoming appointments, etc.  Non-urgent messages can be sent to your provider as well.   To learn more about what you can do with MyChart, go to [email or web address].    Your next appointment:   To be determined  The format  for your next appointment:   In Person  Provider:   Buford Dresser, MD   Other Instructions Will send referral to advanced heart failure   Signed, Buford Dresser, MD PhD 12/05/2020    [place]

## 2020-12-05 NOTE — Patient Instructions (Addendum)
Medication Instructions:  Your Physician recommend you continue on your current medication as directed.    *If you need a refill on your cardiac medications before your next appointment, please call your pharmacy*   Lab Work: None ordered today   Testing/Procedures: None ordered today   Follow-Up: At [facility], you and your health needs are our priority.  As part of our continuing mission to provide you with exceptional heart care, we have created designated Provider Care Teams.  These Care Teams include your primary Cardiologist (physician) and Advanced Practice Providers (APPs -  Physician Assistants and Nurse Practitioners) who all work together to provide you with the care you need, when you need it.  We recommend signing up for the patient portal called "MyChart".  Sign up information is provided on this After Visit Summary.  MyChart is used to connect with patients for Virtual Visits (Telemedicine).  Patients are able to view lab/test results, encounter notes, upcoming appointments, etc.  Non-urgent messages can be sent to your provider as well.   To learn more about what you can do with MyChart, go to [email or web address].    Your next appointment:   To be determined  The format for your next appointment:   In Person  Provider:   Buford Dresser, MD   Other Instructions Will send referral to advanced heart failure

## 2020-12-09 ENCOUNTER — Encounter (HOSPITAL_COMMUNITY): Payer: Self-pay | Admitting: Internal Medicine

## 2020-12-09 ENCOUNTER — Other Ambulatory Visit: Payer: Self-pay

## 2020-12-09 ENCOUNTER — Ambulatory Visit (HOSPITAL_COMMUNITY)
Admission: RE | Admit: 2020-12-09 | Discharge: 2020-12-09 | Disposition: A | Discharge status: Home / Self Care | Payer: 59 | Source: Ambulatory Visit | Attending: Internal Medicine | Admitting: Internal Medicine

## 2020-12-09 VITALS — BP 130/78 | HR 87 | Wt 245.6 lb

## 2020-12-09 DIAGNOSIS — Z880 Allergy status to penicillin: Secondary | ICD-10-CM | POA: Diagnosis not present

## 2020-12-09 DIAGNOSIS — Z8249 Family history of ischemic heart disease and other diseases of the circulatory system: Secondary | ICD-10-CM | POA: Insufficient documentation

## 2020-12-09 DIAGNOSIS — Z8616 Personal history of COVID-19: Secondary | ICD-10-CM | POA: Insufficient documentation

## 2020-12-09 DIAGNOSIS — Z7951 Long term (current) use of inhaled steroids: Secondary | ICD-10-CM | POA: Diagnosis not present

## 2020-12-09 DIAGNOSIS — Z882 Allergy status to sulfonamides status: Secondary | ICD-10-CM | POA: Insufficient documentation

## 2020-12-09 DIAGNOSIS — Z7901 Long term (current) use of anticoagulants: Secondary | ICD-10-CM | POA: Diagnosis not present

## 2020-12-09 DIAGNOSIS — Z86718 Personal history of other venous thrombosis and embolism: Secondary | ICD-10-CM | POA: Diagnosis not present

## 2020-12-09 DIAGNOSIS — G4733 Obstructive sleep apnea (adult) (pediatric): Secondary | ICD-10-CM | POA: Diagnosis not present

## 2020-12-09 DIAGNOSIS — Z79899 Other long term (current) drug therapy: Secondary | ICD-10-CM | POA: Diagnosis not present

## 2020-12-09 DIAGNOSIS — I10 Essential (primary) hypertension: Secondary | ICD-10-CM | POA: Diagnosis not present

## 2020-12-09 DIAGNOSIS — Z886 Allergy status to analgesic agent status: Secondary | ICD-10-CM | POA: Diagnosis not present

## 2020-12-09 DIAGNOSIS — I2721 Secondary pulmonary arterial hypertension: Secondary | ICD-10-CM | POA: Diagnosis not present

## 2020-12-09 DIAGNOSIS — Z881 Allergy status to other antibiotic agents status: Secondary | ICD-10-CM | POA: Diagnosis not present

## 2020-12-09 DIAGNOSIS — Z86711 Personal history of pulmonary embolism: Secondary | ICD-10-CM | POA: Insufficient documentation

## 2020-12-09 DIAGNOSIS — Z87891 Personal history of nicotine dependence: Secondary | ICD-10-CM | POA: Insufficient documentation

## 2020-12-09 DIAGNOSIS — E119 Type 2 diabetes mellitus without complications: Secondary | ICD-10-CM | POA: Insufficient documentation

## 2020-12-09 NOTE — Progress Notes (Signed)
ADVANCED HF CLINIC NOTE  Referring Physician: DR. Harrell Gave Primary Care: McLean-Scocuzza, Nino Glow, MD Primary Cardiologist: Dr. Harrell Gave  HPI:  [name] is a 55 y.o.female with morbid obesity, pulmonary hypertension due iPAH (followed by Dr. Harrietta Guardian at [facility]), RV failure, OSA/OHS on CPAP, breast CA, previous PE.   I saw her in 2020 as an initial consult and as her PAH was being closely managed at [facility], I had her f/u PRN. She was recently seen by Dr. Harrell Gave and c/o 20 pound weight gain with worsening SOB so referred back here for further evaluation.  She continues to follow closely with Dr. Harrietta Guardian. In 2017, had pulmonary angiogram that ruled out CTEPH. In 10/20 she was started on remodulin.  Now on remodulin, sildenafil 40 tid and macintentan 10.  Echo 8/22 LVEF 60-65% RV moderately dilated and HK. TR jet inadequate to estimate RVSP.   Recently seen by Dr. Harrell Gave for increasing SOB. Says she feels like she is "holding on to fluid." Weight today is 245. Says baseline weight is 220. Weight has crept over the last 6 months and "it is not moving". She spoke with [facility] and they have been increasing her torsemide and also given metolazone but said that killed her kidneys and she didn't pee any more. + ankle swelling after she is on her feet. Complete Pulmonary Rehab and now goes to gym 3/x a week. Walks on TM for 5-10 mins then rides bike for 15 mins. Then does some weights.   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   Menorrhagia    secondary to uterine fibroids   OSA  (obstructive sleep apnea)    07/25/13 HST AHI 33/hr, severe hypoxemia O2 min 42% and 95% of the time <89%   PE (pulmonary embolism) 2014   bilateral   Prediabetes    Pulmonary artery hypertension ([place])    Pulmonary nodule    (57m on loeft lower lobe) found on CT scan July 2014, repeat scan Jan 2015 showed less than 424m  Right ovarian cyst    noted 09/2012    S/P TAH (total abdominal hysterectomy) 06/07/2013   Trichomoniasis    05/2011     Current Outpatient Medications  Medication Sig Dispense Refill   acetaminophen (TYLENOL) 500 MG tablet Take 1,000 mg by mouth every 6 (six) hours as needed for moderate pain or headache.      albuterol (VENTOLIN HFA) 108 (90 Base) MCG/ACT inhaler Inhale 1-2 puffs into the lungs every 6 (six) hours as needed for wheezing or shortness of breath. 18 g 11   allopurinol (ZYLOPRIM) 300 MG tablet TAKE 1 TABLET (300 MG TOTAL) BY MOUTH DAILY. APPT FURTHER REFILLS AFTER 6 MONTHS CALL TO SCHEDULE NOW PLEASE 90 tablet 1   anastrozole (ARIMIDEX) 1 MG tablet Take 1 tablet (1 mg total) by mouth daily. 90 tablet 3   azelastine (ASTELIN) 0.1 % nasal spray Place into both nostrils as needed for rhinitis. Use in each nostril as directed  Cholecalciferol 1.25 MG (50000 UT) capsule Take 1 capsule (50,000 Units total) by mouth once a week. d3 13 capsule 1   clotrimazole-betamethasone (LOTRISONE) cream Apply 1 application topically 2 (two) times daily. Prn leg dermatitis 45 g 0   Colchicine (MITIGARE) 0.6 MG CAPS Take 1 capsule by mouth as needed. As needed gout flare max # of pills 2 in 24 hours 30 capsule 11   fluticasone (FLONASE) 50 MCG/ACT nasal spray Place into both nostrils daily as needed for allergies or rhinitis.     Fluticasone-Salmeterol (ADVAIR) 250-50 MCG/DOSE AEPB Inhale 1 puff into the lungs 2 (two) times daily as needed. Rinse mouth 60 each 12   ibuprofen (ADVIL) 800 MG tablet Take 1 tablet (800 mg total) by mouth every 8 (eight) hours as needed. 90 tablet 2    ipratropium-albuterol (DUONEB) 0.5-2.5 (3) MG/3ML SOLN Take 3 mLs by nebulization every 4 (four) hours as needed. 360 mL 12   loratadine (CLARITIN) 10 MG tablet Take 10 mg by mouth daily.     Magnesium Cl-Calcium Carbonate (SLOW MAGNESIUM/CALCIUM) 70-117 MG TBEC Take 2 tablets by mouth in the morning and at bedtime.     metoprolol tartrate (LOPRESSOR) 25 MG tablet Take 1 tablet (25 mg total) by mouth 2 (two) times daily. 180 tablet 3   Multiple Vitamin (MULTIVITAMIN) tablet Take 1 tablet by mouth daily.     omeprazole (PRILOSEC) 40 MG capsule Take 1 capsule (40 mg total) by mouth daily. 30 min before food 90 capsule 3   OPSUMIT 10 MG TABS Take 10 mg by mouth daily.  8   Polyethyl Glycol-Propyl Glycol (SYSTANE OP) Place 1 drop into both eyes daily as needed (dry eyes).      potassium chloride SA (KLOR-CON) 20 MEQ tablet Take 1 tablet (20 mEq total) by mouth daily.     rivaroxaban (XARELTO) 20 MG TABS tablet Take 20 mg by mouth daily with supper.     sildenafil (REVATIO) 20 MG tablet Take 1-2 tablets (20-40 mg total) by mouth 3 (three) times daily. As of 07/03/20 taking 20 mg tid per [facility] Dr. Harrietta Guardian 10 tablet 0   spironolactone (ALDACTONE) 25 MG tablet Take 25 mg by mouth daily.     torsemide (DEMADEX) 100 MG tablet Take 100 mg by mouth daily.      treprostinil (REMODULIN) 20 MG/20ML injection 20 ng/kg/min by Continuous infusion (non-IV) route continuous.     triamcinolone cream (KENALOG) 0.1 % Apply 1 application topically daily as needed (leg discoloration). 454 g 2   No current facility-administered medications for this encounter.   Facility-Administered Medications Ordered in Other Encounters  Medication Dose Route Frequency Provider Last Rate Last Admin   heparin lock flush 100 unit/mL  500 Units Intracatheter Once PRN Nicholas Lose, MD       sodium chloride flush (NS) 0.9 % injection 10 mL  10 mL Intracatheter PRN Nicholas Lose, MD        Allergies  Allergen Reactions   Ampicillin  Other (See Comments)    Severe abdominal pain, dizziness (penicillin is okay)   Amoxicillin Other (See Comments)    LIGHT-HEADED and CLOSE TO "PASSING OUT"  AND ABDOMINAL PAIN Has patient had a PCN reaction causing immediate rash, facial/tongue/throat swelling, SOB or lightheadedness with hypotension: No Has patient had a PCN reaction causing severe rash involving mucus membranes or skin necrosis: No Has patient had a PCN reaction that required hospitalization No Has patient had a PCN reaction occurring within the last 10  years: No If all of the above answers are "NO", then may proceed with Cephalosporin use.   Indomethacin Other (See Comments)    Gastro irritation   Nsaids     GI ulcers    Metronidazole Nausea And Vomiting   Sulfa Antibiotics Rash and Other (See Comments)    Very bad yeast infection      Social History   Socioeconomic History   Marital status: Single    Spouse name: Not on file   Number of children: 1   Years of education: Not on file   Highest education level: Not on file  Occupational History   Not on file  Tobacco Use   Smoking status: Former    Packs/day: 0.33    Years: 10.00    Pack years: 3.30    Types: Cigarettes    Quit date: 10/04/2011    Years since quitting: 9.1   Smokeless tobacco: Never  Vaping Use   Vaping Use: Never used  Substance and Sexual Activity   Alcohol use: Yes    Alcohol/week: 0.0 standard drinks    Comment: social   Drug use: No   Sexual activity: Yes  Other Topics Concern   Not on file  Social History Narrative   Single, lives alone with her children   Lives in [place]    worked Customer service manager at [employer]    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 [employer]   As of 2022 on disability due to pulm artery htn       Children: boys   Grew up in [place] Determinants of Health   Financial Resource Strain: Not on [employer] Insecurity: Not on file  Transportation Needs: Not on file  Physical Activity: Not  on file  Stress: Not on file  Social Connections: Not on file  Intimate Partner Violence: Not on file      Family History  Problem Relation Age of Onset   Hypertension Mother    Kidney disease Mother    Hypertension Father    Stroke Sister    Breast cancer Maternal Grandmother        died at 56   Breast cancer Paternal Grandmother    Breast cancer Cousin        pat first cousin dx in her 23s   Hypertension Brother    Cancer Maternal Aunt        unknown form   Breast cancer Paternal Aunt    Colon cancer Other 28       MGMs brother   Breast cancer Paternal Aunt    Cancer Other        breast ca in [place]   Cancer Other        g uncle colon or stomach ca    Vitals:   12/09/20 1219  BP: 130/78  Pulse: 87  SpO2: 99%  Weight: 111.4 kg (245 lb 9.6 oz)    PHYSICAL EXAM: General:  Well appearing. No respiratory difficulty HEENT: normal Neck: supple. JVP 6-7 Carotids 2+ bilat; no bruits. No lymphadenopathy or thryomegaly appreciated. Cor: PMI nondisplaced. Regular rate & rhythm. No rubs, gallops or murmurs. Lungs: clear Abdomen: obese soft, nontender, nondistended. No hepatosplenomegaly. No bruits or masses. Good bowel sounds. Extremities: no cyanosis, clubbing, rash, edema Neuro: alert & oriented x 3, cranial nerves grossly intact. moves all 4 extremities w/o difficulty. Affect pleasant.    ASSESSMENT & PLAN:    1. PAH  - WHO Group I  due to [facility]. H/o PE but pulmonary angiography 2017 without chronic PE - Followed closely at [facility] on triple therapy with IV treprostinil (started 10/20), sildenafil 40 tid and macitentan 10 daily - NYHA III but reports worsening symptoms over last 1-2 months.  - Echo 8/22 LVEF 60-65% RV moderately dilated and HK. TR jet inadequate to estimate RVSP.  - She feels she has fluid on board but did not respond to metolazone and no fluid on exam. ReDS 31% c/w with normal lung fluid  - Will plan RHC later this week to sort out if functional  decline due to weight gain or worsening RV failure.   Total time spent 35 minutes. Over half that time spent discussing above.   Glori Bickers, MD  1:05 PM

## 2020-12-09 NOTE — Patient Instructions (Signed)
RedsClip done today.  No Labs done today.  No medication changes were made. Please continue all current medications as prescribed.  Your physician recommends that you schedule a follow-up appointment in: 3 months  If you have any questions or concerns before your next appointment please send [place] a message through [place] or call our office at [phone].    TO LEAVE A MESSAGE FOR THE NURSE SELECT OPTION 2, PLEASE LEAVE A MESSAGE INCLUDING: YOUR NAME DATE OF BIRTH CALL BACK NUMBER REASON FOR CALL**this is important as we prioritize the call backs  YOU WILL RECEIVE A CALL BACK THE SAME DAY AS LONG AS YOU CALL BEFORE 4:00 PM   Do the following things EVERYDAY: Weigh yourself in the morning before breakfast. Write it down and keep it in a log. Take your medicines as prescribed Eat low salt foods--Limit salt (sodium) to 2000 mg per day.  Stay as active as you can everyday Limit all fluids for the day to less than 2 liters   At the [facility], you and your health needs are our priority. As part of our continuing mission to provide you with exceptional heart care, we have created designated Provider Care Teams. These Care Teams include your primary Cardiologist (physician) and Advanced Practice Providers (APPs- Physician Assistants and Nurse Practitioners) who all work together to provide you with the care you need, when you need it.   You may see any of the following providers on your designated Care Team at your next follow up: Dr Glori Bickers Dr Haynes Kerns, NP Lyda Jester, [place] Audry Riles, PharmD   Please be sure to bring in all your medications bottles to every appointment.    You are scheduled for a Cardiac Catheterization on Thursday, September 15 with Dr. Glori Bickers.  1. Please arrive at the [facility] (Main Entrance A) at [facility]: [address] [place], [place] [address] at 7:00 AM (This time is two hours before  your procedure to ensure your preparation). Free valet parking service is available.   Special note: Every effort is made to have your procedure done on time. Please understand that emergencies sometimes delay scheduled procedures.  2. Diet: Do not eat solid foods after midnight.  The patient may have clear liquids until 5am upon the day of the procedure.  3. Medication instructions in preparation for your procedure:  Stop taking Xarelto (Rivaroxaban) on Wednesday, September 14. Restart on Friday September 16th 2022  On the morning of your procedure DO NOT take your Toresemide and Spironolactone.   On the morning of your procedure, take any morning medicines NOT listed above.  You may use sips of water.  4. Plan for one night stay--bring personal belongings. 5. Bring a current list of your medications and current insurance cards. 6. You MUST have a responsible person to drive you home. 7. Someone MUST be with you the first 24 hours after you arrive home or your discharge will be delayed. 8. Please wear clothes that are easy to get on and off and wear slip-on shoes.  Thank you for allowing [place] to care for you!   -- [place] [facility] [facility]

## 2020-12-09 NOTE — Progress Notes (Signed)
ReDS Vest / Clip - 12/09/20 1200       ReDS Vest / Clip   Station Marker B    Ruler Value 37    ReDS Value Range Low volume    ReDS Actual Value 31

## 2020-12-10 ENCOUNTER — Other Ambulatory Visit (HOSPITAL_COMMUNITY): Payer: Self-pay | Admitting: *Deleted

## 2020-12-10 ENCOUNTER — Telehealth (HOSPITAL_COMMUNITY): Payer: Self-pay | Admitting: *Deleted

## 2020-12-10 DIAGNOSIS — I2721 Secondary pulmonary arterial hypertension: Secondary | ICD-10-CM

## 2020-12-10 NOTE — Telephone Encounter (Signed)
[place] auth faxed to [facility].

## 2020-12-11 ENCOUNTER — Telehealth (HOSPITAL_COMMUNITY): Payer: Self-pay | Admitting: Cardiology

## 2020-12-11 NOTE — Telephone Encounter (Signed)
Patient called to cancel cath on 12/12/2020 [name] with cath lab aware   Message to provider as Juluis Rainier

## 2020-12-12 ENCOUNTER — Ambulatory Visit (HOSPITAL_COMMUNITY): Admission: RE | Admit: 2020-12-12 | Payer: 59 | Source: Home / Self Care | Admitting: Internal Medicine

## 2020-12-12 ENCOUNTER — Telehealth (HOSPITAL_COMMUNITY): Payer: Self-pay | Admitting: Internal Medicine

## 2020-12-12 ENCOUNTER — Encounter (HOSPITAL_COMMUNITY): Admission: RE | Payer: Self-pay | Source: Home / Self Care

## 2020-12-12 SURGERY — RIGHT HEART CATH
Anesthesia: LOCAL

## 2020-12-13 ENCOUNTER — Other Ambulatory Visit: Payer: Self-pay | Admitting: Gastroenterology

## 2020-12-17 ENCOUNTER — Other Ambulatory Visit: Payer: Self-pay | Admitting: Internal Medicine

## 2020-12-17 DIAGNOSIS — E559 Vitamin D deficiency, unspecified: Secondary | ICD-10-CM

## 2020-12-20 ENCOUNTER — Encounter (HOSPITAL_COMMUNITY): Payer: Self-pay | Admitting: Gastroenterology

## 2020-12-23 ENCOUNTER — Inpatient Hospital Stay: Payer: 59 | Attending: Hematology and Oncology

## 2020-12-23 ENCOUNTER — Other Ambulatory Visit: Payer: Self-pay

## 2020-12-23 DIAGNOSIS — D649 Anemia, unspecified: Secondary | ICD-10-CM | POA: Diagnosis present

## 2020-12-23 DIAGNOSIS — Z79811 Long term (current) use of aromatase inhibitors: Secondary | ICD-10-CM | POA: Insufficient documentation

## 2020-12-23 DIAGNOSIS — Z923 Personal history of irradiation: Secondary | ICD-10-CM | POA: Insufficient documentation

## 2020-12-23 DIAGNOSIS — C50212 Malignant neoplasm of upper-inner quadrant of left female breast: Secondary | ICD-10-CM | POA: Diagnosis not present

## 2020-12-23 DIAGNOSIS — Z170 Estrogen receptor positive status [ER+]: Secondary | ICD-10-CM | POA: Diagnosis present

## 2020-12-23 DIAGNOSIS — Z9221 Personal history of antineoplastic chemotherapy: Secondary | ICD-10-CM | POA: Diagnosis not present

## 2020-12-23 DIAGNOSIS — D509 Iron deficiency anemia, unspecified: Secondary | ICD-10-CM

## 2020-12-23 LAB — CBC WITH DIFFERENTIAL (CANCER CENTER ONLY)
Abs Immature Granulocytes: 0.05 10*3/uL (ref 0.00–0.07)
Basophils Absolute: 0 10*3/uL (ref 0.0–0.1)
Basophils Relative: 0 %
Eosinophils Absolute: 0.2 10*3/uL (ref 0.0–0.5)
Eosinophils Relative: 3 %
HCT: 32.9 % — ABNORMAL LOW (ref 36.0–46.0)
Hemoglobin: 11 g/dL — ABNORMAL LOW (ref 12.0–15.0)
Immature Granulocytes: 1 %
Lymphocytes Relative: 13 %
Lymphs Abs: 0.9 10*3/uL (ref 0.7–4.0)
MCH: 31 pg (ref 26.0–34.0)
MCHC: 33.4 g/dL (ref 30.0–36.0)
MCV: 92.7 fL (ref 80.0–100.0)
Monocytes Absolute: 0.4 10*3/uL (ref 0.1–1.0)
Monocytes Relative: 6 %
Neutro Abs: 5.1 10*3/uL (ref 1.7–7.7)
Neutrophils Relative %: 77 %
Platelet Count: 89 10*3/uL — ABNORMAL LOW (ref 150–400)
RBC: 3.55 MIL/uL — ABNORMAL LOW (ref 3.87–5.11)
RDW: 13.3 % (ref 11.5–15.5)
WBC Count: 6.7 10*3/uL (ref 4.0–10.5)
nRBC: 0 % (ref 0.0–0.2)

## 2020-12-23 LAB — RETICULOCYTES
Immature Retic Fract: 20.8 % — ABNORMAL HIGH (ref 2.3–15.9)
RBC.: 3.46 MIL/uL — ABNORMAL LOW (ref 3.87–5.11)
Retic Count, Absolute: 110 10*3/uL (ref 19.0–186.0)
Retic Ct Pct: 3.2 % — ABNORMAL HIGH (ref 0.4–3.1)

## 2020-12-23 LAB — FERRITIN: Ferritin: 160 ng/mL (ref 11–307)

## 2020-12-23 LAB — IRON AND TIBC
Iron: 81 ug/dL (ref 41–142)
Saturation Ratios: 22 % (ref 21–57)
TIBC: 366 ug/dL (ref 236–444)
UIBC: 286 ug/dL (ref 120–384)

## 2020-12-24 ENCOUNTER — Other Ambulatory Visit: Payer: Self-pay | Admitting: Gastroenterology

## 2020-12-31 ENCOUNTER — Ambulatory Visit (HOSPITAL_COMMUNITY)
Admission: RE | Admit: 2020-12-31 | Discharge: 2020-12-31 | Disposition: A | Discharge status: Home / Self Care | Payer: 59 | Source: Ambulatory Visit | Attending: Gastroenterology | Admitting: Gastroenterology

## 2020-12-31 ENCOUNTER — Encounter (HOSPITAL_COMMUNITY)
Admission: RE | Disposition: A | Discharge status: Home / Self Care | Payer: Self-pay | Source: Ambulatory Visit | Attending: Gastroenterology

## 2020-12-31 ENCOUNTER — Encounter (HOSPITAL_COMMUNITY): Payer: Self-pay | Admitting: Gastroenterology

## 2020-12-31 ENCOUNTER — Ambulatory Visit (HOSPITAL_COMMUNITY): Payer: 59 | Admitting: Anesthesiology

## 2020-12-31 ENCOUNTER — Other Ambulatory Visit: Payer: Self-pay

## 2020-12-31 DIAGNOSIS — K2900 Acute gastritis without bleeding: Secondary | ICD-10-CM | POA: Diagnosis not present

## 2020-12-31 DIAGNOSIS — R1310 Dysphagia, unspecified: Secondary | ICD-10-CM | POA: Diagnosis not present

## 2020-12-31 DIAGNOSIS — K3189 Other diseases of stomach and duodenum: Secondary | ICD-10-CM | POA: Diagnosis not present

## 2020-12-31 DIAGNOSIS — K640 First degree hemorrhoids: Secondary | ICD-10-CM | POA: Insufficient documentation

## 2020-12-31 DIAGNOSIS — K5730 Diverticulosis of large intestine without perforation or abscess without bleeding: Secondary | ICD-10-CM | POA: Diagnosis not present

## 2020-12-31 DIAGNOSIS — K625 Hemorrhage of anus and rectum: Secondary | ICD-10-CM | POA: Insufficient documentation

## 2020-12-31 DIAGNOSIS — K766 Portal hypertension: Secondary | ICD-10-CM | POA: Insufficient documentation

## 2020-12-31 DIAGNOSIS — D509 Iron deficiency anemia, unspecified: Secondary | ICD-10-CM | POA: Diagnosis present

## 2020-12-31 HISTORY — PX: COLONOSCOPY WITH PROPOFOL: SHX5780

## 2020-12-31 HISTORY — PX: BIOPSY: SHX5522

## 2020-12-31 HISTORY — PX: ESOPHAGOGASTRODUODENOSCOPY (EGD) WITH PROPOFOL: SHX5813

## 2020-12-31 SURGERY — ESOPHAGOGASTRODUODENOSCOPY (EGD) WITH PROPOFOL
Anesthesia: Monitor Anesthesia Care | Laterality: Bilateral

## 2020-12-31 MED ORDER — PROPOFOL 10 MG/ML IV BOLUS
INTRAVENOUS | Status: DC | PRN
  Administered 2020-12-31 (×3): 20 mg via INTRAVENOUS

## 2020-12-31 MED ORDER — PROPOFOL 10 MG/ML IV BOLUS
INTRAVENOUS | Status: AC
  Filled 2020-12-31: qty 20

## 2020-12-31 MED ORDER — SODIUM CHLORIDE 0.9 % IV SOLN: INTRAVENOUS | Status: DC

## 2020-12-31 MED ORDER — PROPOFOL 1000 MG/100ML IV EMUL
INTRAVENOUS | Status: AC
  Filled 2020-12-31: qty 100

## 2020-12-31 MED ORDER — PROPOFOL 500 MG/50ML IV EMUL
INTRAVENOUS | Status: DC | PRN
  Administered 2020-12-31: 135 ug/kg/min via INTRAVENOUS

## 2020-12-31 MED ORDER — LACTATED RINGERS IV SOLN: INTRAVENOUS | Status: DC

## 2020-12-31 MED ORDER — ONDANSETRON HCL 4 MG/2ML IJ SOLN
INTRAMUSCULAR | Status: DC | PRN
  Administered 2020-12-31: 4 mg via INTRAVENOUS

## 2020-12-31 SURGICAL SUPPLY — 24 items

## 2020-12-31 NOTE — Interval H&P Note (Signed)
History and Physical Interval Note:  12/31/2020 10:46 AM  [name]  has presented today for surgery, with the diagnosis of Anemia, dysphagia, rectl bleeding.  The various methods of treatment have been discussed with the patient and family. After consideration of risks, benefits and other options for treatment, the patient has consented to  Procedure(s): ESOPHAGOGASTRODUODENOSCOPY (EGD) WITH PROPOFOL (Bilateral) COLONOSCOPY WITH PROPOFOL (Bilateral) BALLOON DILATION (Bilateral) as a surgical intervention.  The patient's history has been reviewed, patient examined, no change in status, stable for surgery.  I have reviewed the patient's chart and labs.  Questions were answered to the patient's satisfaction.     [name]

## 2020-12-31 NOTE — Anesthesia Preprocedure Evaluation (Addendum)
Anesthesia Evaluation  Patient identified by MRN, date of birth, ID band Patient awake    Reviewed: Allergy & Precautions, NPO status , Patient's Chart, lab work & pertinent test results  History of Anesthesia Complications Negative for: history of anesthetic complications ("slow to wake up")  Airway Mallampati: III  TM Distance: >3 FB Neck ROM: Full    Dental no notable dental hx. (+) Dental Advisory Given   Pulmonary sleep apnea and Continuous Positive Airway Pressure Ventilation , former smoker, PE   Pulmonary exam normal        Cardiovascular hypertension, Pt. on home beta blockers and Pt. on medications + DVT  Normal cardiovascular exam  Pulmonary Hypertension  IMPRESSIONS 2021   1. Left ventricular ejection fraction, by estimation, is 60 to 65%. The left ventricle has normal function. The left ventricle has no regional wall motion abnormalities. Left ventricular diastolic parameters were normal. 2. Right ventricular systolic function is moderately reduced. The right ventricular size is moderately enlarged. Tricuspid regurgitation signal is inadequate for assessing PA pressure. 3. Left atrial size was mildly dilated. 4. The mitral valve is normal in structure. No evidence of mitral valve regurgitation. No evidence of mitral stenosis. 5. The aortic valve is normal in structure. Aortic valve regurgitation is not visualized. No aortic stenosis is present. 6. The inferior vena cava is normal in size with greater than 50% respiratory variability, suggesting right atrial pressure of 3 mmHg.   Neuro/Psych  Headaches, negative psych ROS   GI/Hepatic Neg liver ROS, GERD  ,  Endo/Other  diabetes, Type 2, Oral Hypoglycemic AgentsBreast Cancer   Renal/GU Renal disease     Musculoskeletal negative musculoskeletal ROS (+) Arthritis ,   Abdominal   Peds  Hematology  (+) Blood dyscrasia, anemia ,   Anesthesia Other  Findings Day of surgery medications reviewed with the patient.  Reproductive/Obstetrics                            Anesthesia Physical  Anesthesia Plan  ASA: 3  Anesthesia Plan: MAC   Post-op Pain Management:    Induction: Intravenous  PONV Risk Score and Plan: 2 and Ondansetron and Propofol infusion  Airway Management Planned: Natural Airway and Simple Face Mask  Additional Equipment:   Intra-op Plan:   Post-operative Plan:   Informed Consent: I have reviewed the patients History and Physical, chart, labs and discussed the procedure including the risks, benefits and alternatives for the proposed anesthesia with the patient or authorized representative who has indicated his/her understanding and acceptance.     Dental advisory given  Plan Discussed with: Anesthesiologist and CRNA  Anesthesia Plan Comments:        Anesthesia Quick Evaluation

## 2020-12-31 NOTE — Anesthesia Procedure Notes (Signed)
Procedure Name: MAC Date/Time: 12/31/2020 10:45 AM Performed by: Niel Hummer, CRNA Pre-anesthesia Checklist: Patient identified, Emergency Drugs available, Suction available and Patient being monitored Oxygen Delivery Method: Simple face mask

## 2020-12-31 NOTE — Op Note (Addendum)
[facility] Patient Name: [name] Procedure Date: 12/31/2020 MRN: [id number] Attending MD: [name] , MD Date of Birth: 10-28-1965 CSN: [id number] Age: 55 Admit Type: Outpatient Procedure:                Upper GI endoscopy Indications:              Iron deficiency anemia, Dysphagia Providers:                [name], MD, Jeanella Cara, RN,                            Benetta Spar, Technician Referring MD:             Nino Glow Mclean-Scocuzza MD, MD Medicines:                Propofol per Anesthesia, Monitored Anesthesia Care Complications:            No immediate complications. Estimated Blood Loss:     Estimated blood loss was minimal. Procedure:                Pre-Anesthesia Assessment:                           - Prior to the procedure, a History and Physical                            was performed, and patient medications and                            allergies were reviewed. The patient's tolerance of                            previous anesthesia was also reviewed. The risks                            and benefits of the procedure and the sedation                            options and risks were discussed with the patient.                            All questions were answered, and informed consent                            was obtained. Prior Anticoagulants: The patient has                            taken Xarelto (rivaroxaban), last dose was 2 days                            prior to procedure. ASA Grade Assessment: III - A                            patient with severe systemic disease. After  reviewing the risks and benefits, the patient was                            deemed in satisfactory condition to undergo the                            procedure.                           After obtaining informed consent, the endoscope was                            passed under direct vision.  Throughout the                            procedure, the patient's blood pressure, pulse, and                            oxygen saturations were monitored continuously. The                            EGD-OR was introduced through the mouth, and                            advanced to the second part of duodenum. The upper                            GI endoscopy was accomplished without difficulty.                            The patient tolerated the procedure well. Scope In: Scope Out: Findings:      The examined esophagus was normal.      The Z-line was regular and was found 46 cm from the incisors.      Segmental mild inflammation characterized by congestion (edema) and       erythema was found in the gastric antrum.      Mild portal hypertensive gastropathy was found in the gastric body.       Biopsies were taken with a cold forceps for histology. Estimated blood       loss was minimal.      The examined duodenum was normal. Biopsies were taken with a cold       forceps for histology. Estimated blood loss was minimal. Impression:               - Normal esophagus.                           - Z-line regular, 46 cm from the incisors.                           - Acute gastritis.                           - Portal hypertensive gastropathy. Biopsied.                           -  Normal examined duodenum. Biopsied. Moderate Sedation:      Not Applicable - Patient had care per Anesthesia. Recommendation:           - Patient has a contact number available for                            emergencies. The signs and symptoms of potential                            delayed complications were discussed with the                            patient. Return to normal activities tomorrow.                            Written discharge instructions were provided to the                            patient.                           - Follow an antireflux regimen.                           - Await pathology  results.                           - Resume Xarelto (rivaroxaban) at prior dose                            tomorrow. Procedure Code(s):        --- Professional ---                           [phone], Esophagogastroduodenoscopy, flexible,                            transoral; with biopsy, single or multiple Diagnosis Code(s):        --- Professional ---                           D50.9, Iron deficiency anemia, unspecified                           R13.10, Dysphagia, unspecified                           K29.00, Acute gastritis without bleeding                           K76.6, Portal hypertension                           K31.89, Other diseases of stomach and duodenum CPT copyright 2019 American Medical Association. All rights reserved. The codes documented in this report are preliminary and upon coder review may  be revised to meet current compliance requirements. [name], MD 12/31/2020 11:26:23 AM This  report has been signed electronically. Number of Addenda: 0

## 2020-12-31 NOTE — H&P (Signed)
Date of Initial H&P: 12/13/20  History reviewed, patient examined, no change in status, stable for surgery.

## 2020-12-31 NOTE — Anesthesia Postprocedure Evaluation (Signed)
Anesthesia Post Note  Patient: Jazmon Kos  Procedure(s) Performed: ESOPHAGOGASTRODUODENOSCOPY (EGD) WITH PROPOFOL (Bilateral) COLONOSCOPY WITH PROPOFOL (Bilateral) BIOPSY     Patient location during evaluation: Endoscopy Anesthesia Type: MAC Level of consciousness: awake and alert Pain management: pain level controlled Vital Signs Assessment: post-procedure vital signs reviewed and stable Respiratory status: spontaneous breathing, nonlabored ventilation, respiratory function stable and patient connected to nasal cannula oxygen Cardiovascular status: blood pressure returned to baseline and stable Postop Assessment: no apparent nausea or vomiting Anesthetic complications: no   No notable events documented.  Last Vitals:  Vitals:   12/31/20 1128 12/31/20 1138  BP: 120/61 125/69  Pulse: 77 96  Resp: 17 18  Temp:    SpO2: 94% 97%    Last Pain:  Vitals:   12/31/20 1138  TempSrc:   PainSc: 0-No pain                 Kairah Leoni DANIEL

## 2020-12-31 NOTE — Transfer of Care (Signed)
Immediate Anesthesia Transfer of Care Note  Patient: [name]  Procedure(s) Performed: ESOPHAGOGASTRODUODENOSCOPY (EGD) WITH PROPOFOL (Bilateral) COLONOSCOPY WITH PROPOFOL (Bilateral) BIOPSY  Patient Location: PACU  Anesthesia Type:MAC  Level of Consciousness: awake, alert  and oriented  Airway & Oxygen Therapy: Patient Spontanous Breathing and Patient connected to face mask oxygen  Post-op Assessment: Report given to RN, Post -op Vital signs reviewed and stable and Patient moving all extremities X 4  Post vital signs: Reviewed and stable  Last Vitals:  Vitals Value Taken Time  BP 92/69   Temp    Pulse 95 12/31/20 1118  Resp 9 12/31/20 1118  SpO2 99 % 12/31/20 1118  Vitals shown include unvalidated device data.  Last Pain:  Vitals:   12/31/20 0950  TempSrc: Temporal  PainSc: 7          Complications: No notable events documented.

## 2020-12-31 NOTE — Discharge Instructions (Signed)
YOU HAD AN ENDOSCOPIC PROCEDURE TODAY: Refer to the procedure report and other information in the discharge instructions given to you for any specific questions about what was found during the examination. If this information does not answer your questions, please call [facility] office at [phone] to clarify.   YOU SHOULD EXPECT: Some feelings of bloating in the abdomen. Passage of more gas than usual. Walking can help get rid of the air that was put into your GI tract during the procedure and reduce the bloating. If you had a lower endoscopy (such as a colonoscopy or flexible sigmoidoscopy) you may notice spotting of blood in your stool or on the toilet paper. Some abdominal soreness may be present for a day or two, also.  DIET: Your first meal following the procedure should be a light meal and then it is ok to progress to your normal diet. A half-sandwich or bowl of soup is an example of a good first meal. Heavy or fried foods are harder to digest and may make you feel nauseous or bloated. Drink plenty of fluids but you should avoid alcoholic beverages for 24 hours. If you had a esophageal dilation, please see attached instructions for diet.    ACTIVITY: Your care partner should take you home directly after the procedure. You should plan to take it easy, moving slowly for the rest of the day. You can resume normal activity the day after the procedure however YOU SHOULD NOT DRIVE, use power tools, machinery or perform tasks that involve climbing or major physical exertion for 24 hours (because of the sedation medicines used during the test).   SYMPTOMS TO REPORT IMMEDIATELY: A gastroenterologist can be reached at any hour. Please call [phone]  for any of the following symptoms:  . Following lower endoscopy (colonoscopy, flexible sigmoidoscopy) Excessive amounts of blood in the stool  Significant tenderness, worsening of abdominal pains  Swelling of the abdomen that is new, acute  Fever of 100  or higher  . Following upper endoscopy (EGD, EUS, ERCP, esophageal dilation) Vomiting of blood or coffee ground material  New, significant abdominal pain  New, significant chest pain or pain under the shoulder blades  Painful or persistently difficult swallowing  New shortness of breath  Black, tarry-looking or red, bloody stools  FOLLOW UP:  If any biopsies were taken you will be contacted by phone or by letter within the next 1-3 weeks. Call [phone]  if you have not heard about the biopsies in 3 weeks.  Please also call with any specific questions about appointments or follow up tests. YOU HAD AN ENDOSCOPIC PROCEDURE TODAY: Refer to the procedure report and other information in the discharge instructions given to you for any specific questions about what was found during the examination. If this information does not answer your questions, please call [facility] office at [phone] to clarify.   YOU SHOULD EXPECT: Some feelings of bloating in the abdomen. Passage of more gas than usual. Walking can help get rid of the air that was put into your GI tract during the procedure and reduce the bloating. If you had a lower endoscopy (such as a colonoscopy or flexible sigmoidoscopy) you may notice spotting of blood in your stool or on the toilet paper. Some abdominal soreness may be present for a day or two, also.  DIET: Your first meal following the procedure should be a light meal and then it is ok to progress to your normal diet. A half-sandwich or bowl of soup   example of a good first meal. Heavy or fried foods are harder to digest and may make you feel nauseous or bloated. Drink plenty of fluids but you should avoid alcoholic beverages for 24 hours. If you had a esophageal dilation, please see attached instructions for diet.    ACTIVITY: Your care partner should take you home directly after the procedure. You should plan to take it easy, moving slowly for the rest of the day. You can resume  normal activity the day after the procedure however YOU SHOULD NOT DRIVE, use power tools, machinery or perform tasks that involve climbing or major physical exertion for 24 hours (because of the sedation medicines used during the test).   SYMPTOMS TO REPORT IMMEDIATELY: A gastroenterologist can be reached at any hour. Please call [phone]  for any of the following symptoms:  Following lower endoscopy (colonoscopy, flexible sigmoidoscopy) Excessive amounts of blood in the stool  Significant tenderness, worsening of abdominal pains  Swelling of the abdomen that is new, acute  Fever of 100 or higher  Following upper endoscopy (EGD, EUS, ERCP, esophageal dilation) Vomiting of blood or coffee ground material  New, significant abdominal pain  New, significant chest pain or pain under the shoulder blades  Painful or persistently difficult swallowing  New shortness of breath  Black, tarry-looking or red, bloody stools  FOLLOW UP:  If any biopsies were taken you will be contacted by phone or by letter within the next 1-3 weeks. Call [phone]  if you have not heard about the biopsies in 3 weeks.  Please also call with any specific questions about appointments or follow up tests.

## 2020-12-31 NOTE — Op Note (Addendum)
[facility] Patient Name: [name] Procedure Date: 12/31/2020 MRN: [id number] Attending MD: [name] , MD Date of Birth: Jan 16, 1966 CSN: [id number] Age: 55 Admit Type: Outpatient Procedure:                Colonoscopy Indications:              Last colonoscopy: June 2019, Rectal bleeding, Iron                            deficiency anemia Providers:                [name], MD, Jeanella Cara, RN,                            Benetta Spar, Technician Referring MD:             Nino Glow Mclean-Scocuzza MD, MD Medicines:                Propofol per Anesthesia, Monitored Anesthesia Care Complications:            No immediate complications. Estimated Blood Loss:     Estimated blood loss: none. Procedure:                Pre-Anesthesia Assessment:                           - Prior to the procedure, a History and Physical                            was performed, and patient medications and                            allergies were reviewed. The patient's tolerance of                            previous anesthesia was also reviewed. The risks                            and benefits of the procedure and the sedation                            options and risks were discussed with the patient.                            All questions were answered, and informed consent                            was obtained. Prior Anticoagulants: The patient has                            taken Xarelto (rivaroxaban), last dose was 2 days                            prior to procedure. ASA Grade Assessment: III - A  patient with severe systemic disease. After                            reviewing the risks and benefits, the patient was                            deemed in satisfactory condition to undergo the                            procedure.                           After obtaining informed consent, the colonoscope                             was passed under direct vision. Throughout the                            procedure, the patient's blood pressure, pulse, and                            oxygen saturations were monitored continuously. The                            [id number]-HQ190L ([id number]) Olympus colonoscope was                            introduced through the anus and advanced to the the                            cecum, identified by appendiceal orifice and                            ileocecal valve. The colonoscopy was performed                            without difficulty. The patient tolerated the                            procedure well. The quality of the bowel                            preparation was fair and fair but repeated                            irrigation led to a good and adequate prep. The                            ileocecal valve, appendiceal orifice, and rectum                            were photographed. Scope In: 11:04:46 AM Scope Out: 11:15:03 AM Scope Withdrawal Time: 0 hours 7 minutes 22 seconds  Total Procedure Duration: 0 hours 10 minutes 17 seconds  Findings:  The perianal and digital rectal examinations were normal.      Scattered small and large-mouthed diverticula were found in the sigmoid       colon.      Internal hemorrhoids were found during retroflexion. The hemorrhoids       were medium-sized and Grade I (internal hemorrhoids that do not       prolapse). Impression:               - Preparation of the colon was fair.                           - Diverticulosis in the sigmoid colon.                           - Internal hemorrhoids.                           - No specimens collected. Moderate Sedation:      Not Applicable - Patient had care per Anesthesia. Recommendation:           - Patient has a contact number available for                            emergencies. The signs and symptoms of potential                            delayed complications were discussed  with the                            patient. Return to normal activities tomorrow.                            Written discharge instructions were provided to the                            patient.                           - High fiber diet.                           - Repeat colonoscopy in 10 years for screening                            purposes.                           - Continue present medications.                           - Resume Xarelto (rivaroxaban) at prior dose                            tomorrow. Procedure Code(s):        --- Professional ---                           [phone], Colonoscopy, flexible;  diagnostic, including                            collection of specimen(s) by brushing or washing,                            when performed (separate procedure) Diagnosis Code(s):        --- Professional ---                           D50.9, Iron deficiency anemia, unspecified                           K62.5, Hemorrhage of anus and rectum                           K64.0, First degree hemorrhoids                           K57.30, Diverticulosis of large intestine without                            perforation or abscess without bleeding CPT copyright 2019 [employer]. All rights reserved. The codes documented in this report are preliminary and upon coder review may  be revised to meet current compliance requirements. [name], MD 12/31/2020 11:30:15 AM This report has been signed electronically. Number of Addenda: 0

## 2021-01-01 ENCOUNTER — Encounter (HOSPITAL_COMMUNITY): Payer: Self-pay | Admitting: Gastroenterology

## 2021-01-04 LAB — SURGICAL PATHOLOGY

## 2021-01-22 ENCOUNTER — Other Ambulatory Visit: Payer: Self-pay | Admitting: *Deleted

## 2021-01-22 DIAGNOSIS — Z170 Estrogen receptor positive status [ER+]: Secondary | ICD-10-CM

## 2021-01-22 NOTE — Progress Notes (Incomplete)
Patient Care Team: McLean-Scocuzza, Nino Glow, MD as PCP - General (Internal Medicine) Vonna Drafts, FNP as Nurse Practitioner (Nurse Practitioner) Stark Klein, MD as Consulting Physician (General Surgery) Nicholas Lose, MD as Consulting Physician (Hematology and Oncology) Gery Pray, MD as Consulting Physician (Radiation Oncology) Delice Bison Charlestine Massed, NP as Nurse Practitioner (Hematology and Oncology)  DIAGNOSIS: No diagnosis found.  SUMMARY OF ONCOLOGIC HISTORY: Oncology History  Malignant neoplasm of upper-inner quadrant of left breast in female, estrogen receptor positive ([place])  03/04/2016 Initial Diagnosis   Screening detected left breast density posterior medial 1.4 cm by ultrasound axilla negative, grade 3 IDC ER 60%, PR 0%, Ki-67 30%, HER-2 positive ratio 6.04, copy #16; T1 cN0 stage IA clinical stage   03/21/2016 Genetic Testing   NEgative genetic testing on the comprehensive cancer panel and Negative genetic testing for the MSH2 inversion analysis ([place] inversion). The Comprehensive Cancer Panel offered by [facility] includes sequencing and/or deletion duplication testing of the following 32 genes: APC, ATM, AXIN2, BARD1, BMPR1A, BRCA1, BRCA2, BRIP1, CDH1, CDK4, CDKN2A, CHEK2, EPCAM, FANCC, MLH1, MSH2, MSH6, MUTYH, NBN, PALB2, PMS2, POLD1, POLE, PTEN, RAD51C, RAD51D, SCG5/GREM1, SMAD4, STK11, TP53, VHL, and XRCC2.   The report date is March 21, 2016.   07/27/2016 Surgery   Left lumpectomy: IDC grade 2, 2.6 cm, DCIS intermediate grade,0/3 lymph nodes negative, ER 60%, PR 0%, Ki-67 30%, HER-2 positive ratio 6.04, T2 N0 stage II a   09/01/2016 - 11/03/2016 Chemotherapy   Abraxane Herceptin weekly 12 followed by Herceptin maintenance every 3 weeks     11/19/2016 - 01/20/2017 Radiation Therapy   Adjuvant radiation therapy   01/29/2017 -  Anti-estrogen oral therapy   Anastrozole 2.5 mg daily     CHIEF COMPLIANT:  Follow-up of left breast cancer on anastrozole,  evaluation of thrombocytopenia and anemia  INTERVAL HISTORY: [name] is a 55 y.o. with above-mentioned history of left breast cancer treated with lumpectomy, adjuvant chemotherapy, and radiation who is currently on anastrozole therapy. She presents to the clinic today for follow-up.   ALLERGIES:  is allergic to ampicillin, amoxicillin, indomethacin, nsaids, metronidazole, and sulfa antibiotics.  MEDICATIONS:  Current Outpatient Medications  Medication Sig Dispense Refill   acetaminophen (TYLENOL) 500 MG tablet Take 1,000 mg by mouth every 6 (six) hours as needed for moderate pain or headache.      albuterol (VENTOLIN HFA) 108 (90 Base) MCG/ACT inhaler Inhale 1-2 puffs into the lungs every 6 (six) hours as needed for wheezing or shortness of breath. 18 g 11   allopurinol (ZYLOPRIM) 300 MG tablet TAKE 1 TABLET (300 MG TOTAL) BY MOUTH DAILY. APPT FURTHER REFILLS AFTER 6 MONTHS CALL TO SCHEDULE NOW PLEASE 90 tablet 1   anastrozole (ARIMIDEX) 1 MG tablet Take 1 tablet (1 mg total) by mouth daily. (Patient taking differently: Take 1 mg by mouth every evening.) 90 tablet 3   azelastine (ASTELIN) 0.1 % nasal spray Place 1-2 sprays into both nostrils daily as needed for allergies. Use in each nostril as directed     Cholecalciferol (VITAMIN D3) 1.25 MG (50000 UT) CAPS TAKE 1 CAPSULE BY MOUTH ONCE A WEEK 13 capsule 1   clotrimazole-betamethasone (LOTRISONE) cream Apply 1 application topically 2 (two) times daily. Prn leg dermatitis (Patient taking differently: Apply 1 application topically 2 (two) times daily as needed (dermatitis).) 45 g 0   Colchicine (MITIGARE) 0.6 MG CAPS Take 1 capsule by mouth as needed. As needed gout flare max # of pills 2 in 24 hours 30 capsule  11   fluticasone (FLONASE) 50 MCG/ACT nasal spray Place into both nostrils daily as needed for allergies or rhinitis.     Fluticasone-Salmeterol (ADVAIR) 250-50 MCG/DOSE AEPB Inhale 1 puff into the lungs 2 (two) times daily as needed.  Rinse mouth 60 each 12   ibuprofen (ADVIL) 800 MG tablet Take 1 tablet (800 mg total) by mouth every 8 (eight) hours as needed. 90 tablet 2   ipratropium-albuterol (DUONEB) 0.5-2.5 (3) MG/3ML SOLN Take 3 mLs by nebulization every 4 (four) hours as needed. 360 mL 12   loratadine (CLARITIN) 10 MG tablet Take 10 mg by mouth daily.     Magnesium Cl-Calcium Carbonate (SLOW MAGNESIUM/CALCIUM) 70-117 MG TBEC Take 2 tablets by mouth in the morning and at bedtime.     metoprolol tartrate (LOPRESSOR) 25 MG tablet Take 1 tablet (25 mg total) by mouth 2 (two) times daily. 180 tablet 3   Multiple Vitamin (MULTIVITAMIN) tablet Take 1 tablet by mouth daily.     omeprazole (PRILOSEC) 40 MG capsule Take 1 capsule (40 mg total) by mouth daily. 30 min before food 90 capsule 3   OPSUMIT 10 MG TABS Take 10 mg by mouth daily.  8   Polyethyl Glycol-Propyl Glycol (SYSTANE OP) Place 1 drop into both eyes 3 (three) times daily as needed (dry/irritated eyes.).     potassium chloride SA (KLOR-CON) 20 MEQ tablet Take 1 tablet (20 mEq total) by mouth daily.     rivaroxaban (XARELTO) 20 MG TABS tablet Take 20 mg by mouth daily with supper.     sildenafil (REVATIO) 20 MG tablet Take 1-2 tablets (20-40 mg total) by mouth 3 (three) times daily. As of 07/03/20 taking 20 mg tid per [facility] pulm Dr. Harrietta Guardian (Patient taking differently: Take 40 mg by mouth 3 (three) times daily.) 10 tablet 0   spironolactone (ALDACTONE) 25 MG tablet Take 25 mg by mouth daily.     torsemide (DEMADEX) 100 MG tablet Take 100 mg by mouth daily.      treprostinil (REMODULIN) 20 MG/20ML injection 20 ng/kg/min by Continuous infusion (non-IV) route continuous.     triamcinolone cream (KENALOG) 0.1 % Apply 1 application topically daily as needed (leg discoloration). 454 g 2   No current facility-administered medications for this visit.   Facility-Administered Medications Ordered in Other Visits  Medication Dose Route Frequency Provider Last Rate Last Admin    heparin lock flush 100 unit/mL  500 Units Intracatheter Once PRN Nicholas Lose, MD       sodium chloride flush (NS) 0.9 % injection 10 mL  10 mL Intracatheter PRN Nicholas Lose, MD        PHYSICAL EXAMINATION: ECOG PERFORMANCE STATUS: {CHL ONC ECOG ZO:[id number]}  There were no vitals filed for this visit. There were no vitals filed for this visit.  BREAST:*** No palpable masses or nodules in either right or left breasts. No palpable axillary supraclavicular or infraclavicular adenopathy no breast tenderness or nipple discharge. (exam performed in the presence of a chaperone)  LABORATORY DATA:  I have reviewed the data as listed CMP Latest Ref Rng & Units 11/21/2020 10/25/2020 10/25/2020  Glucose 70 - 99 mg/dL 116(H) - 119(H)  BUN 6 - 20 mg/dL 15 - 23  Creatinine 0.44 - 1.00 mg/dL 0.89 - 0.96  Sodium 135 - 145 mmol/L 140 - 139  Potassium 3.5 - 5.1 mmol/L 3.7 - 3.5  Chloride 98 - 111 mmol/L 104 - 101  CO2 22 - 32 mmol/L 25 - 29  Calcium 8.9 -  10.3 mg/dL 8.9 - 8.9  Total Protein 6.5 - 8.1 g/dL 7.8 - 7.4  Total Bilirubin 0.3 - 1.2 mg/dL 0.7 - 0.8  Alkaline Phos 38 - 126 U/L 97 108 93  AST 15 - 41 U/L 12(L) - 12  ALT 0 - 44 U/L 11 - 10    Lab Results  Component Value Date   WBC 6.7 12/23/2020   HGB 11.0 (L) 12/23/2020   HCT 32.9 (L) 12/23/2020   MCV 92.7 12/23/2020   PLT 89 (L) 12/23/2020   NEUTROABS 5.1 12/23/2020    ASSESSMENT & PLAN:  No problem-specific Assessment & Plan notes found for this encounter.    No orders of the defined types were placed in this encounter.  The patient has a good understanding of the overall plan. she agrees with it. she will call with any problems that may develop before the next visit here.  Total time spent: *** mins including face to face time and time spent for planning, charting and coordination of care  Rulon Eisenmenger, MD, MPH 01/22/2021  I, Thana Ates, am acting as scribe for Dr. Nicholas Lose.  {insert scribe  attestation}

## 2021-01-23 ENCOUNTER — Telehealth: Payer: Self-pay | Admitting: Adult Health

## 2021-01-23 ENCOUNTER — Telehealth: Payer: Self-pay

## 2021-01-23 ENCOUNTER — Inpatient Hospital Stay: Payer: 59

## 2021-01-23 ENCOUNTER — Inpatient Hospital Stay: Payer: 59 | Admitting: Adult Health

## 2021-01-23 NOTE — Assessment & Plan Note (Deleted)
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 Adjuvant chemotherapy with Abraxane and Herceptin (cannot take Taxol because she could not receive steroids)weekly 12 followed by every 3 week Herceptin for one yearuntil May 2019 2. followed by adjuvant radiation08/29/2018-01/05/2017 3. Followed by adjuvant antiestrogen therapystarted 01/28/2017 ------------------------------------------------------------------------------------------------------------------------------- Treatment plan: Adjuvant antiestrogen therapy withanastrozole 1 mg daily 01/28/2017 Adjuvant Herceptin completed May 2019  Anastrozole toxicities:Denies any hot flashes arthralgias or myalgias. Pulmonary hypertension: This is probably her biggest problem currently. Recurrent gout  Surveillance: 1. Breast exam10/27/2022:Benign. [facility]  [facility]

## 2021-01-23 NOTE — Telephone Encounter (Signed)
Spoke with pt regarding her missed appt today. Pt states that she was confused on the day and time of her appt and thought her appt was tomorrow. Pt aware to expect a call from the scheduling department to reschedule her appt. Wilber Bihari, NP aware.

## 2021-01-23 NOTE — Telephone Encounter (Signed)
Scheduled per sch msg. Called and left msg

## 2021-01-23 NOTE — Assessment & Plan Note (Deleted)
Longstanding anemia: Status post upper endoscopy colonoscopy by Dr. Michail Sermon Lab review 10/25/2020: WBC 6.1, hemoglobin 11.5, platelets 131 11/21/2020: WBC 6.9, hemoglobin 10.4, platelets 93 12/23/2020: WBC 6.7, hemoglobin 11, platelets 89, ferritin 160, iron saturation 22%, reticulocyte count 110, percentage 3.2%, immature reticulocyte fraction 20.8% I reviewed the blood work with the patient and it suggest that there is an ongoing increase production in the bone marrow. 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

## 2021-01-28 ENCOUNTER — Inpatient Hospital Stay: Payer: 59 | Admitting: Adult Health

## 2021-01-28 ENCOUNTER — Inpatient Hospital Stay: Payer: 59 | Attending: Hematology and Oncology

## 2021-02-06 ENCOUNTER — Other Ambulatory Visit: Payer: Self-pay | Admitting: Internal Medicine

## 2021-02-06 DIAGNOSIS — M109 Gout, unspecified: Secondary | ICD-10-CM

## 2021-03-04 ENCOUNTER — Other Ambulatory Visit: Payer: Self-pay

## 2021-03-04 ENCOUNTER — Encounter: Payer: Self-pay | Admitting: Internal Medicine

## 2021-03-04 ENCOUNTER — Ambulatory Visit (INDEPENDENT_AMBULATORY_CARE_PROVIDER_SITE_OTHER): Payer: 59 | Admitting: Internal Medicine

## 2021-03-04 VITALS — BP 124/72 | HR 98 | Temp 97.6°F | Ht 65.0 in | Wt 240.6 lb

## 2021-03-04 DIAGNOSIS — C50212 Malignant neoplasm of upper-inner quadrant of left female breast: Secondary | ICD-10-CM | POA: Diagnosis not present

## 2021-03-04 DIAGNOSIS — L989 Disorder of the skin and subcutaneous tissue, unspecified: Secondary | ICD-10-CM

## 2021-03-04 DIAGNOSIS — Z0000 Encounter for general adult medical examination without abnormal findings: Secondary | ICD-10-CM | POA: Diagnosis not present

## 2021-03-04 DIAGNOSIS — Z6841 Body Mass Index (BMI) 40.0 and over, adult: Secondary | ICD-10-CM

## 2021-03-04 DIAGNOSIS — J069 Acute upper respiratory infection, unspecified: Secondary | ICD-10-CM

## 2021-03-04 DIAGNOSIS — R053 Chronic cough: Secondary | ICD-10-CM

## 2021-03-04 DIAGNOSIS — I272 Pulmonary hypertension, unspecified: Secondary | ICD-10-CM

## 2021-03-04 DIAGNOSIS — J9611 Chronic respiratory failure with hypoxia: Secondary | ICD-10-CM | POA: Diagnosis not present

## 2021-03-04 DIAGNOSIS — R0609 Other forms of dyspnea: Secondary | ICD-10-CM | POA: Diagnosis not present

## 2021-03-04 DIAGNOSIS — Z170 Estrogen receptor positive status [ER+]: Secondary | ICD-10-CM

## 2021-03-04 MED ORDER — PREDNISONE 20 MG PO TABS: ORAL_TABLET | ORAL | 0 refills | Status: DC

## 2021-03-04 MED ORDER — SPIRONOLACTONE 50 MG PO TABS: 50.0000 mg | ORAL_TABLET | Freq: Every day | ORAL | 3 refills | Status: AC

## 2021-03-04 MED ORDER — AZITHROMYCIN 250 MG PO TABS: ORAL_TABLET | ORAL | 0 refills | Status: AC

## 2021-03-04 NOTE — Patient Instructions (Addendum)
Mucinex dm green label   [facility] [place] [facility] Garwin Brothers ob/gyn   Would Dr. Harrietta Guardian be ok with Wynonia Hazard ?  [facility] dermatology  [phone] Not available [place] [address]      Phone Fax E-mail Address  [phone] Not available [address] [place] [address]     Specialties     Obstetrics and Gynecology       Consider pfizer 4 doses for now   03/06/2021 Appointment Pulmonology Brock Bad, MD   [address] [place], [place] [address]   [phone] (Work)   [phone] (Fax)      03/06/2021 Clinical Support Endocrinology      03/06/2021 Appointment Cardiology Harrietta Guardian, Salvadore Oxford, MD   [address] [place], [place] [address]   [phone] (Work)   [phone] (Fax)      03/18/2021 Appointment Cardiology Harrietta Guardian, Salvadore Oxford, MD   [place], [place] [address]   [phone] (Work)   [phone] (Fax)      04/04/2021 Office Visit Pulmonology Brock Bad, MD   [place],  [address]   [phone] (Work)   [phone] (Fax)       Plan of Treatment - Scheduled Orders Scheduled Orders Name Type Priority Associated Diagnoses Order Schedule  [facility] DRAW Lab Routine PAH (pulmonary artery hypertension) (CMS-HCC)   Expected: 02/25/2021 (Approximate), Expires: 02/25/2022  Flow volume loop PFT Routine PAH (pulmonary artery hypertension) (CMS-HCC)   Expected: 03/04/2021 (Approximate), Expires: 02/25/2022  Lung Volumes via Plethysmography PFT Routine PAH (pulmonary artery hypertension) (CMS-HCC)   Expected: 03/04/2021 (Approximate), Expires: 02/25/2022  Diffusion Studies PFT Routine PAH (pulmonary artery hypertension) (CMS-HCC)   Expected: 03/04/2021 (Approximate), Expires: 02/25/2022  Echocardiogram W Colorflow Spectral Doppler Echocardiography Routine PAH (pulmonary artery hypertension) (CMS-HCC)   Expected: 03/04/2021  (Approximate), Expires: 08/26/2022  ECG 12 lead ECG Routine PAH (pulmonary artery hypertension) (CMS-HCC)   Expected: 03/04/2021 (Approximate), Expires: 02/25/2022   Tirzepatide Injection-for wt loss What is this medication? TIRZEPATIDE (tir ZEP a tide) treats type 2 diabetes. It works by increasing insulin levels in your body, which decreases your blood sugar (glucose). Changes to diet and exercise are often combined with this medication. This medicine may be used for other purposes; ask your health care provider or pharmacist if you have questions. COMMON BRAND NAME(S): MOUNJARO What should I tell my care team before I take this medication? They need to know if you have any of these conditions: Endocrine tumors (MEN 2) or if someone in your family had these tumors Eye disease, vision problems Gallbladder disease History of pancreatitis Kidney disease Stomach or intestine problems Thyroid cancer or if someone in your family had thyroid cancer An unusual or allergic reaction to tirzepatide, other medications, foods, dyes, or preservatives Pregnant or trying to get pregnant Breast-feeding How should I use this medication? This medication is injected under the skin. You will be taught how to prepare and give it. It is given once every week (every 7 days). Keep taking it unless your health care provider tells you to stop. If you use this medication with insulin, you should inject this medication and the insulin separately. Do not mix them together. Do not give the injections right next to each other. Change (rotate) injection sites with each injection. This medication comes with INSTRUCTIONS FOR USE. Ask your pharmacist for directions on  how to use this medication. Read the information carefully. Talk to your pharmacist or care team if you have questions. It is important that you put your used needles and syringes in a special sharps container. Do not put them in a trash can. If you do not have  a sharps container, call your pharmacist or care team to get one. A special MedGuide will be given to you by the pharmacist with each prescription and refill. Be sure to read this information carefully each time. Talk to your care team about the use of this medication in children. Special care may be needed. Overdosage: If you think you have taken too much of this medicine contact a poison control center or emergency room at once. NOTE: This medicine is only for you. Do not share this medicine with others. What if I miss a dose? If you miss a dose, take it as soon as you can unless it is more than 4 days (96 hours) late. If it is more than 4 days late, skip the missed dose. Take the next dose at the normal time. Do not take 2 doses within 3 days of each other. What may interact with this medication? Alcohol containing beverages Antiviral medications for HIV or AIDS Aspirin and aspirin-like medications Beta-blockers like atenolol, metoprolol, propranolol Certain medications for blood pressure, heart disease, irregular heart beat Chromium Clonidine Diuretics Female hormones, such as estrogens or progestins, birth control pills Fenofibrate Gemfibrozil Guanethidine Isoniazid Lanreotide Female hormones or anabolic steroids MAOIs like Carbex, Eldepryl, Marplan, Nardil, and Parnate Medications for weight loss Medications for allergies, asthma, cold, or cough Medications for depression, anxiety, or psychotic disturbances Niacin Nicotine NSAIDs, medications for pain and inflammation, like ibuprofen or naproxen Octreotide Other medications for diabetes, like glyburide, glipizide, or glimepiride Pasireotide Pentamidine Phenytoin Probenecid Quinolone antibiotics such as ciprofloxacin, levofloxacin, ofloxacin Reserpine Some herbal dietary supplements Steroid medications such as prednisone or cortisone Sulfamethoxazole; trimethoprim Thyroid hormones Warfarin This list may not describe all  possible interactions. Give your health care provider a list of all the medicines, herbs, non-prescription drugs, or dietary supplements you use. Also tell them if you smoke, drink alcohol, or use illegal drugs. Some items may interact with your medicine. What should I watch for while using this medication? Visit your care team for regular checks on your progress. Drink plenty of fluids while taking this medication. Check with your care team if you get an attack of severe diarrhea, nausea, and vomiting. The loss of too much body fluid can make it dangerous for you to take this medication. A test called the HbA1C (A1C) will be monitored. This is a simple blood test. It measures your blood sugar control over the last 2 to 3 months. You will receive this test every 3 to 6 months. Learn how to check your blood sugar. Learn the symptoms of low and high blood sugar and how to manage them. Always carry a quick-source of sugar with you in case you have symptoms of low blood sugar. Examples include hard sugar candy or glucose tablets. Make sure others know that you can choke if you eat or drink when you develop serious symptoms of low blood sugar, such as seizures or unconsciousness. They must get medical help at once. Tell your care team if you have high blood sugar. You might need to change the dose of your medication. If you are sick or exercising more than usual, you might need to change the dose of your medication. Do not skip  meals. Ask your care team if you should avoid alcohol. Many nonprescription cough and cold products contain sugar or alcohol. These can affect blood sugar. Pens should never be shared. Even if the needle is changed, sharing may result in passing of viruses like hepatitis or HIV. Wear a medical ID bracelet or chain, and carry a card that describes your disease and details of your medication and dosage times. Birth control may not work properly while you are taking this medication. If you  take birth control pills by mouth, your care team may recommend another type of birth control for 4 weeks after you start this medication and for 4 weeks after each increase in your dose of this medication. Ask your care team which birth control methods you should use. What side effects may I notice from receiving this medication? Side effects that you should report to your care team as soon as possible: Allergic reactions--skin rash, itching, hives, swelling of the face, lips, tongue, or throat Change in vision Dehydration--increased thirst, dry mouth, feeling faint or lightheaded, headache, dark yellow or brown urine Gallbladder problems--severe stomach pain, nausea, vomiting, fever Kidney injury--decrease in the amount of urine, swelling of the ankles, hands, or feet Pancreatitis--severe stomach pain that spreads to your back or gets worse after eating or when touched, fever, nausea, vomiting Thyroid cancer--new mass or lump in the neck, pain or trouble swallowing, trouble breathing, hoarseness Side effects that usually do not require medical attention (report these to your care team if they continue or are bothersome): Constipation Diarrhea Loss of Appetite Nausea Stomach pain Upset stomach Vomiting This list may not describe all possible side effects. Call your doctor for medical advice about side effects. You may report side effects to FDA at 1-800-FDA-1088. Where should I keep my medication? Keep out of the reach of children and pets. Refrigeration (preferred): Store unopened pens in a refrigerator between 2 and 8 degrees C (36 and 46 degrees F). Keep it in the original carton until you are ready to take it. Do not freeze or use if the medication has been frozen. Protect from light. Get rid of any unused medication after the expiration date on the label. Room Temperature: The pen may be stored at room temperature below 30 degrees C (86 degrees F) for up to a total of 21 days if needed.  Protect from light. Avoid exposure to extreme heat. If it is stored at room temperature, throw away any unused medication after 21 days or after it expires, whichever is first. The pen has glass parts. Handle it carefully. If you drop the pen on a hard surface, do not use it. Use a new pen for your injection. To get rid of medications that are no longer needed or have expired: Take the medication to a medication take-back program. Check with your pharmacy or law enforcement to find a location. If you cannot return the medication, ask your pharmacist or care team how to get rid of this medication safely. NOTE: This sheet is a summary. It may not cover all possible information. If you have questions about this medicine, talk to your doctor, pharmacist, or health care provider.  2022 [employer] Standard (2020-08-14 00:00:00)

## 2021-03-04 NOTE — Progress Notes (Signed)
Chief Complaint  Patient presents with   Annual Exam   Annual  1. Pulmonary htn and uri near thanksgiving had sob wheezing cough with mucous and called pulm [facility] Dr Harrietta Guardian and given zpack which did not help still wheezing and coughing on pulsed O2 today 5L and will [facility] with a lot of upcoming appts  2. Left breast cancer 02/2016 f/u h/o 09/2021 and due for mammogram dx 03/29/21 [facility] year 5 diagnostic mammogram b/l  3. Obesity working out but wants to  lose will ask if chf and Dr. Harrietta Guardian ok with mounjaro    Review of Systems  Constitutional:  Negative for weight loss.  HENT:  Negative for hearing loss.   Eyes:  Negative for blurred vision.  Respiratory:  Positive for cough, sputum production, shortness of breath and wheezing.   Cardiovascular:  Negative for chest pain.  Gastrointestinal:  Negative for abdominal pain and blood in stool.  Genitourinary:  Negative for dysuria.  Musculoskeletal:  Negative for falls and joint pain.  Skin:  Negative for rash.  Neurological:  Negative for headaches.  Psychiatric/Behavioral:  Negative for depression.   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   Menorrhagia    secondary to uterine fibroids   OSA (obstructive sleep apnea)    07/25/13 HST AHI 33/hr, severe hypoxemia O2 min 42% and 95% of the time <89%   PE (pulmonary embolism) 2014   bilateral   Prediabetes    Pulmonary artery hypertension (HCC)    Pulmonary nodule    (23m on loeft lower lobe) found on CT scan July 2014, repeat scan Jan 2015 showed less than 457m  Right  ovarian cyst    noted 09/2012    S/P TAH (total abdominal hysterectomy) 06/07/2013   Trichomoniasis    05/2011    Past Surgical History:  Procedure Laterality Date   ABDOMINAL HYSTERECTOMY N/A 06/07/2013   Procedure: HYSTERECTOMY ABDOMINAL WITH BIALTERAL SALPINGECTOMY;  Surgeon: VaElveria RoyalsMD;  [facility];  Service: Gynecology;  Laterality: N/A;   BIOPSY  09/02/2017   Procedure: BIOPSY;  Surgeon: ScWilford CornerMD;  [facility];  Service: Endoscopy;;   BIOPSY  12/31/2020   Procedure: BIOPSY;  Surgeon: ScWilford CornerMD;  [facility];  Service: Endoscopy;;   BREAST LUMPECTOMY Left 07/27/2016   BREAST LUMPECTOMY WITH RADIOACTIVE SEED AND SENTINEL LYMPH NODE BIOPSY (Left)   BREAST LUMPECTOMY WITH RADIOACTIVE SEED AND SENTINEL LYMPH NODE BIOPSY Left 07/27/2016   Procedure: BREAST LUMPECTOMY WITH RADIOACTIVE SEED AND SENTINEL LYMPH NODE BIOPSY;  Surgeon: FaStark KleinMD;  [facility] Service: General;  Laterality: Left;   CARDIAC CATHETERIZATION N/A 12/24/2015   Procedure: Right Heart Cath;  Surgeon: JaAdrian ProwsMD;  [facility];  Service: Cardiovascular;  Laterality: N/A;   CESAREAN SECTION     COLONOSCOPY     COLONOSCOPY WITH PROPOFOL N/A 09/02/2017   Procedure: COLONOSCOPY WITH PROPOFOL;  Surgeon: ScWilford CornerMD;  [facility];  Service: Endoscopy;  Laterality: N/A;   COLONOSCOPY WITH PROPOFOL Bilateral 12/31/2020   Procedure: COLONOSCOPY WITH PROPOFOL;  Surgeon: Wilford Corner, MD;  [facility];  Service: Endoscopy;  Laterality: Bilateral;   ESOPHAGOGASTRODUODENOSCOPY (EGD) WITH PROPOFOL N/A 09/02/2017   Procedure: ESOPHAGOGASTRODUODENOSCOPY (EGD) WITH PROPOFOL;  Surgeon: Wilford Corner, MD;  [facility];  Service: Endoscopy;  Laterality: N/A;   ESOPHAGOGASTRODUODENOSCOPY (EGD) WITH PROPOFOL Bilateral 12/31/2020   Procedure: ESOPHAGOGASTRODUODENOSCOPY (EGD) WITH PROPOFOL;  Surgeon: Wilford Corner, MD;  [facility];  Service: Endoscopy;  Laterality: Bilateral;   IR CV LINE INJECTION  07/27/2017   POLYPECTOMY  2008   Removal of uterine polyp   PORT-A-CATH REMOVAL Right 09/09/2017   Procedure: REMOVAL PORT-A-CATH;  Surgeon: Stark Klein, MD;  Location: [facility];  Service: General;  Laterality: Right;   PORTA CATH INSERTION  07/27/2016   PORTACATH PLACEMENT Right 07/27/2016   Procedure: INSERTION PORT-A-CATH;  Surgeon: Stark Klein, MD;  Location: [facility];  Service: General;  Laterality: Right;   SUBMUCOSAL INJECTION  09/02/2017   Procedure: SUBMUCOSAL INJECTION;  Surgeon: Wilford Corner, MD;  [facility];  Service: Endoscopy;;  epi injection   Family History  Problem Relation Age of Onset   Hypertension Mother    Kidney disease Mother    Hypertension Father    Stroke Sister    Breast cancer Maternal Grandmother        died at 66   Breast cancer Paternal Grandmother    Breast cancer Cousin        pat first cousin dx in her 79s   Hypertension Brother    Cancer Maternal Aunt        unknown form   Breast cancer Paternal Aunt    Colon cancer Other 52       MGMs brother   Breast cancer Paternal Aunt    Cancer Other        breast ca in [facility]   Cancer Other        g uncle colon or stomach ca   Social History   Socioeconomic History   Marital status: Single    Spouse name: Not on file   Number of children: 1   Years of education: Not on file   Highest education level: Not on file  Occupational History   Not on file  Tobacco Use   Smoking status: Former    Packs/day: 0.33    Years: 10.00    Pack years: 3.30    Types: Cigarettes    Quit date: 10/04/2011    Years since quitting: 9.4   Smokeless tobacco: Never  Vaping Use   Vaping Use: Never used  Substance and Sexual Activity   Alcohol use: Yes    Alcohol/week: 0.0 standard drinks    Comment: social   Drug use: No   Sexual activity: Yes  Other Topics Concern   Not on file   Social History Narrative   Single, lives alone with her children   Lives in [place]    worked Customer service manager at [employer]    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    albuterol (VENTOLIN HFA) 108 (90 Base) MCG/ACT inhaler Inhale 1-2 puffs into the lungs every 6 (six) hours as needed for wheezing or shortness of breath.   allopurinol (ZYLOPRIM) 300 MG tablet TAKE 1 TABLET BY MOUTH EVERY DAY NEED APPT   anastrozole (ARIMIDEX) 1 MG tablet Take 1 tablet (1 mg total) by mouth daily. (Patient taking differently: Take 1 mg by mouth every evening.)   azelastine (ASTELIN) 0.1 % nasal spray Place 1-2 sprays into both nostrils daily as needed for allergies. Use in each nostril as directed   azithromycin (ZITHROMAX) 250 MG tablet Take 2 tablets on day 1, then 1 tablet daily on days 2 through 5   Cholecalciferol (VITAMIN D3) 1.25 MG (50000 UT) CAPS TAKE 1 CAPSULE BY MOUTH ONCE A WEEK   clotrimazole-betamethasone (LOTRISONE) cream Apply 1 application topically 2 (two) times daily. Prn leg dermatitis (Patient taking differently: Apply 1 application topically 2 (two) times daily as needed (dermatitis).)   Colchicine (MITIGARE) 0.6 MG CAPS Take 1 capsule by mouth as needed. As needed gout flare max # of pills 2 in 24 hours   fluticasone (FLONASE) 50 MCG/ACT nasal spray Place into both nostrils daily as needed for allergies or rhinitis.   Fluticasone-Salmeterol (ADVAIR) 250-50 MCG/DOSE  AEPB Inhale 1 puff into the lungs 2 (two) times daily as needed. Rinse mouth   ibuprofen (ADVIL) 800 MG tablet Take 1 tablet (800 mg total) by mouth every 8 (eight) hours as needed.   ipratropium-albuterol (DUONEB) 0.5-2.5 (3) MG/3ML SOLN Take 3 mLs by nebulization every 4 (four) hours as needed.   loratadine (CLARITIN) 10 MG tablet Take 10 mg by mouth daily.   Magnesium Cl-Calcium Carbonate (SLOW MAGNESIUM/CALCIUM) 70-117 MG TBEC Take 2 tablets by mouth in the morning and at bedtime.   metoprolol tartrate (LOPRESSOR) 25 MG tablet Take 1 tablet (25 mg total) by mouth 2 (two) times daily.   Multiple Vitamin (MULTIVITAMIN) tablet Take 1 tablet by mouth daily.   omeprazole (PRILOSEC) 40 MG capsule Take 1 capsule (40 mg total) by mouth daily. 30 min before food   OPSUMIT 10 MG TABS Take 10 mg by mouth daily.   Polyethyl Glycol-Propyl Glycol (SYSTANE OP) Place 1 drop into both eyes 3 (three) times daily as needed (dry/irritated eyes.).   potassium chloride SA (KLOR-CON) 20 MEQ tablet Take 1 tablet (20 mEq total) by mouth daily.   rivaroxaban (XARELTO) 20 MG TABS tablet Take 20 mg by mouth daily with supper.   sildenafil (REVATIO) 20 MG tablet Take 1-2 tablets (20-40 mg total) by mouth 3 (three) times daily. As of 07/03/20 taking 20 mg tid per [facility]m Dr. Harrietta Guardian (Patient taking differently: Take 40 mg by mouth 3 (three) times daily.)   torsemide (DEMADEX) 100 MG tablet Take 100 mg by mouth daily.    treprostinil (REMODULIN) 20 MG/20ML injection 20 ng/kg/min by Continuous infusion (non-IV) route continuous.   triamcinolone cream (KENALOG) 0.1 % Apply 1 application topically daily as needed (leg discoloration).   [DISCONTINUED] spironolactone (ALDACTONE) 25 MG tablet Take 25 mg by mouth daily.   Allergies  Allergen Reactions   Ampicillin Other (See Comments)    Severe abdominal pain, dizziness (penicillin is okay)   Amoxicillin  Other (See Comments)    LIGHT-HEADED and CLOSE TO "PASSING OUT"  AND  ABDOMINAL PAIN Has patient had a PCN reaction causing immediate rash, facial/tongue/throat swelling, SOB or lightheadedness with hypotension: No Has patient had a PCN reaction causing severe rash involving mucus membranes or skin necrosis: No Has patient had a PCN reaction that required hospitalization No Has patient had a PCN reaction occurring within the last 10 years: No If all of the above answers are "NO", then may proceed with Cephalosporin use.   Indomethacin Other (See Comments)    Gastro irritation   Nsaids     GI ulcers    Metronidazole Nausea And Vomiting   Sulfa Antibiotics Rash and Other (See Comments)    Very bad yeast infection   Recent Results (from the past 2160 hour(s))  Ferritin     Status: None   Collection Time: 12/23/20 10:45 AM  Result Value Ref Range   Ferritin 160 11 - 307 ng/mL    Comment: Performed at [facility], [address], [place], [place] [address]  Iron and TIBC     Status: None   Collection Time: 12/23/20 10:45 AM  Result Value Ref Range   Iron 81 41 - 142 ug/dL   TIBC 366 236 - 444 ug/dL   Saturation Ratios 22 21 - 57 %   UIBC 286 120 - 384 ug/dL    Comment: Performed at [facility], [place] [address], [place], [place] [address]  Reticulocytes     Status: Abnormal   Collection Time: 12/23/20 10:45 AM  Result Value Ref Range   Retic Ct Pct 3.2 (H) 0.4 - 3.1 %   RBC. 3.46 (L) 3.87 - 5.11 MIL/uL   Retic Count, Absolute 110.0 19.0 - 186.0 K/uL   Immature Retic Fract 20.8 (H) 2.3 - 15.9 %    Comment: Performed at [facility], [place] [address] [place], [place] [address]  CBC with Differential ([place] Only)     Status: Abnormal   Collection Time: 12/23/20 10:45 AM  Result Value Ref Range   WBC Count 6.7 4.0 - 10.5 K/uL   RBC 3.55 (L) 3.87 - 5.11 MIL/uL   Hemoglobin 11.0 (L) 12.0 - 15.0 g/dL   HCT 32.9 (L) 36.0 - 46.0 %   MCV 92.7 80.0 - 100.0 fL   MCH 31.0 26.0  - 34.0 pg   MCHC 33.4 30.0 - 36.0 g/dL   RDW 13.3 11.5 - 15.5 %   Platelet Count 89 (L) 150 - 400 K/uL   nRBC 0.0 0.0 - 0.2 %   Neutrophils Relative % 77 %   Neutro Abs 5.1 1.7 - 7.7 K/uL   Lymphocytes Relative 13 %   Lymphs Abs 0.9 0.7 - 4.0 K/uL   Monocytes Relative 6 %   Monocytes Absolute 0.4 0.1 - 1.0 K/uL   Eosinophils Relative 3 %   Eosinophils Absolute 0.2 0.0 - 0.5 K/uL   Basophils Relative 0 %   Basophils Absolute 0.0 0.0 - 0.1 K/uL   Immature Granulocytes 1 %   Abs Immature Granulocytes 0.05 0.00 - 0.07 K/uL    Comment: Performed at [facility], [place] [address] [place], [place] [address]  Surgical pathology     Status: None   Collection Time: 12/31/20 10:55 AM  Result Value Ref Range   SURGICAL PATHOLOGY      SURGICAL PATHOLOGY  THIS IS AN AMENDED REPORT  CASE: [id number]  PATIENT: Cheryel Yett Surgical Pathology Report Amendment   Reason for Addendum #1:  Immunohistochemistry results Reason for Amendment #1: Immunohistochemistry results  Clinical History: Anemia, dysphagia, rectal bleeding ([facility])   FINAL MICROSCOPIC DIAGNOSIS:  A. DUODENUM, BIOPSY: -  Benign duodenal mucosa -  No acute inflammation, villous blunting or increased intraepithelial lymphocytes identified  B. STOMACH, DISTAL, BIOPSY: -  Mild chronic gastritis without activity -  H. pylori organisms present -  No intestinal metaplasia identified -  See comment  COMMENT:  B.  H. pylori immunohistochemistry is pending and will be reported in an addendum.  AMENDMENT NOTE: DIAGNOSTIC INFORMATION HAS NOT BEEN CHANGED. The original report indicated No H. pylori identified; however, immunohistochemistry for H. pylori was pending.  Final report should read "H. pylori orga nisms present" INSTEAD OF "No H. pylori organisms identified". This has been corrected.  GROSS DESCRIPTION:  Specimen A: Received in formalin are tan, soft tissue fragments that are submitted  in toto. Number: 2.  Size: 0.3 and 0.4 cm.  Blocks: 1  Specimen B: Received in formalin is a tan, soft tissue fragment that is submitted in toto.  Size: 0.5 cm, 1 block submitted.  SW 12/31/2020   ADDENDUM:  H. pylori immunohistochemistry is POSITIVE for microorganisms.        Final Diagnosis performed by Thressa Sheller, MD.   Electronically signed 01/01/2021 Addendum #1 performed by Thressa Sheller, MD.   Electronically signed 01/04/2021 Amendment #1 performed by Thressa Sheller, MD.   Electronically signed 01/04/2021 Technical component performed at [facility], [place] [address] [place], [place] [address].  Professional component performed at [employer]. [facility], [place] [address], [place], [place] [address].  Immunohistochemistry Technical component  (if applicable) was performed at [facility]. [address] [place], [place],  [address].   IMMUNOHISTOCHEMISTRY DISCLAIMER (if applicable): Some of these immunohistochemical stains may have been developed and the performance characteristics determine by [facility]. Some may not have been cleared or approved by the U.S. Food and Drug Administration. The FDA has determined that such clearance or approval is not necessary. This test is used for clinical purposes. It should not be regarded as investigational or for research. This laboratory is certified under the [employer] (CLIA-88) as qualified to perform high complexity clinical laboratory testing.  The controls stained appropriately.    Objective  Body mass index is 40.04 kg/m. Wt Readings from Last 3 Encounters:  03/04/21 240 lb 9.6 oz (109.1 kg)  12/31/20 240 lb (108.9 kg)  12/09/20 245 lb 9.6 oz (111.4 kg)   Temp Readings from Last 3 Encounters:  03/04/21 97.6 F (36.4 C) (Temporal)  12/31/20 97.7 F (36.5 C) (Axillary)  11/21/20 97.6 F (36.4 C) (Temporal)   BP  Readings from Last 3 Encounters:  03/04/21 124/72  12/31/20 125/69  12/09/20 130/78   Pulse Readings from Last 3 Encounters:  03/04/21 98  12/31/20 96  12/09/20 87    Physical Exam Vitals and nursing note reviewed.  Constitutional:      Appearance: Normal appearance. She is well-developed and well-groomed.  HENT:     Head: Normocephalic and atraumatic.  Eyes:     Conjunctiva/sclera: Conjunctivae normal.     Pupils: Pupils are equal, round, and reactive to light.  Cardiovascular:     Rate and Rhythm: Normal rate and regular rhythm.     Heart sounds: Normal heart sounds. No murmur heard. Pulmonary:     Effort: Pulmonary  effort is normal.     Breath sounds: Wheezing present.  Abdominal:     General: Abdomen is flat. Bowel sounds are normal.     Tenderness: There is no abdominal tenderness.  Musculoskeletal:        General: No tenderness.  Skin:    General: Skin is warm and dry.  Neurological:     General: No focal deficit present.     Mental Status: She is alert and oriented to person, place, and time. Mental status is at baseline.     Cranial Nerves: Cranial nerves 2-12 are intact.     Gait: Gait is intact.  Psychiatric:        Attention and Perception: Attention and perception normal.        Mood and Affect: Mood and affect normal.        Speech: Speech normal.        Behavior: Behavior normal. Behavior is cooperative.        Thought Content: Thought content normal.        Cognition and Memory: Cognition and memory normal.        Judgment: Judgment normal.    Assessment  Plan  Annual physical exam See below   Malignant neoplasm of upper-inner quadrant of left breast in female, estrogen receptor positive ([place]) - Plan: MM DIAG BREAST TOMO BILATERAL  Dyspnea on exertion - Plan: predniSONE (DELTASONE) 20 MG tablet  Chronic respiratory failure with hypoxia with severe pulm htn - Plan: spironolactone (ALDACTONE) 50 MG tablet, predniSONE (DELTASONE) 20 MG tablet Cont  other nmeds advair, duoneb, opsumit, revatio 20, torsemide 100 mg qd remodulin per [facility] Dr. Harrietta Guardian  F/u Dr. Sung Amabile 02/2021   Chronic cough - Plan: predniSONE (DELTASONE) 20 MG tablet, azithromycin (ZITHROMAX) 250 MG tablet  Upper respiratory tract infection, unspecified type - Plan: predniSONE (DELTASONE) 20 MG tablet, azithromycin (ZITHROMAX) 250 MG tablet  Skin lesion lower leg ? If from chronic swelling - Plan: Ambulatory referral to Dermatology  Tried topical steroid did not help Rx   Morbid obesity will see if Dr. Lavell Islam and Dr. Harrietta Guardian ok with [facility]  Consider sample   HM Flu utd 01/03/21  MMR immune  Prevnar 09/2020 consider pna 23 7.202 and shingrix declines 03/04/21  Hep A/B utd immune hep B -hep A 2/2 hep A immune covid 3/3 pfizer  consider 4th dose Tdap utd 06/21/17 Hep C negative 02/26/16    Pap smear -s/p TAH (w/o cervix only ovaries intact) DUB 2/2 fibroids, adenomyosis -follows with Dr. Mody[facility] pap neg 08/2008 last saw 01/2017 Dr. Benjie Karvonen -obtained records 04/14/17 visit    DEXA [facility] 06/28/17 normal    h/o breast cancer left  mammo 03/29/20 negative [facility] f/u h/o Dr. Lindi Adie 10/15/2020  mammo [facility] 07/05/2018 abnormal with new right breast cal. H/o left breast cancer  -breast bx 09/27/18 right breast negative   -f/u [facility] h/o due in 01/2019 and [place] surgery 03/2019 due to insurance reasons initial care was with h/o (Dr. Renold Genta surgery (Dr. Barry Dienes) in [place] of note  03/29/20 [facility] mammo neg except chronic changes left breast f/u yearly and Dr. Lindi Adie due to see 09/2021 Ordered mammo 03/29/21 dx dx 03/05/16 left breast cancer   Colonoscopy/EGD had 09/02/17 [facility] Dr. Michail Sermon diverticulosis/hemorrhooids f/u in 10 years   10/05/19 echo as of 10/25/20 echo  1. Left ventricular ejection fraction, by estimation, is 60 to 65%. The  left ventricle has normal function. The left ventricle has no regional  wall  motion abnormalities. Left ventricular diastolic function  could not  be evaluated. There is the  interventricular septum is flattened in systole, consistent with right  ventricular pressure overload.   2. Right ventricular systolic function is moderately reduced. The right  ventricular size is severely enlarged. Tricuspid regurgitation signal is  inadequate for assessing PA pressure.   3. Right atrial size was moderately dilated.   4. The mitral valve is grossly normal. No evidence of mitral valve  regurgitation. No evidence of mitral stenosis.   5. The aortic valve was not well visualized. Aortic valve regurgitation  is not visualized. No aortic stenosis is present.   6. The inferior vena cava is dilated in size with <50% respiratory  variability, suggesting right atrial pressure of 15 mmHg.   She is former smoker from mid 78s age 68 to 60 3 cig per day    rec healthy diet and exercise    Specialists pul[facility]. Harrietta Guardian H/o Dr. Bethanie Dicker [facility] in [place], Dr. Christopher/ Dr. Haroldine Laws Surgery Dr. Barry Dienes Ob/gyn Dr. Benjie Karvonen >Dr. Garwin Brothers       Provider: Dr. Olivia Mackie [facility]oc[facility]-Internal Medicine

## 2021-03-05 NOTE — Progress Notes (Signed)
Heart failure ok with mounjaro pt just needs ot ask Dr. Harrietta Guardian pulmonary  Inform pt

## 2021-03-09 NOTE — Progress Notes (Signed)
Patient did not show for appointment. Note left for templating purposes   ADVANCED HF CLINIC NOTE  Referring Physician: DR. Harrell Gave Primary Care: McLean-Scocuzza, Nino Glow, MD Primary Cardiologist: Dr. Harrell Gave  HPI:  [name] is a 55 y.o.female with morbid obesity, pulmonary hypertension due iPAH (followed by Dr. Harrietta Guardian at [facility]), RV failure, OSA/OHS on CPAP, breast CA, previous PE.   I saw her in 2020 as an initial consult and as her PAH was being closely managed at [facility], I had her f/u PRN. She was een by Dr. Harrell Gave on 12/09/20 and c/o 20 pound weight gain with worsening SOB so referred back here for further evaluation.  She continues to follow closely with Dr. Harrietta Guardian. In 2017, had pulmonary angiogram that ruled out CTEPH. In 10/20 she was started on remodulin.  Now on remodulin, sildenafil 40 tid and macintentan 10. Has persistent NYHA III symptoms but has been resistant to uptitration of remodulin, Has RHC at [facility] scheduled for tomorrow  Echo 8/22 LVEF 60-65% RV moderately dilated and HK. TR jet inadequate to estimate RVSP.   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   Menorrhagia    secondary to uterine fibroids   OSA (obstructive sleep apnea)    07/25/13 HST AHI 33/hr, severe hypoxemia O2 min 42% and 95% of the time <89%   PE (pulmonary embolism) 2014   bilateral   Prediabetes    Pulmonary artery hypertension ([place])    Pulmonary nodule    (65m on loeft lower lobe) found on CT scan July 2014, repeat scan Jan 2015 showed less than 474m  Right  ovarian cyst    noted 09/2012    S/P TAH (total abdominal hysterectomy) 06/07/2013   Trichomoniasis    05/2011     Current Outpatient Medications  Medication Sig Dispense Refill   acetaminophen (TYLENOL) 500 MG tablet Take 1,000 mg by mouth every 6 (six) hours as needed for moderate pain or headache.      albuterol (VENTOLIN HFA) 108 (90 Base) MCG/ACT inhaler Inhale 1-2 puffs into the lungs every 6 (six) hours as needed for wheezing or shortness of breath. 18 g 11   allopurinol (ZYLOPRIM) 300 MG tablet TAKE 1 TABLET BY MOUTH EVERY DAY NEED APPT 90 tablet 1   anastrozole (ARIMIDEX) 1 MG tablet Take 1 tablet (1 mg total) by mouth daily. (Patient taking differently: Take 1 mg by mouth every evening.) 90 tablet 3   azelastine (ASTELIN) 0.1 % nasal spray Place 1-2 sprays into both nostrils daily as needed for allergies. Use in each nostril as directed     azithromycin (ZITHROMAX) 250 MG tablet Take 2 tablets on day 1, then 1 tablet daily on days 2 through 5 6 tablet 0   Cholecalciferol (VITAMIN D3) 1.25 MG (50000 UT) CAPS TAKE 1 CAPSULE BY MOUTH ONCE A WEEK 13 capsule 1   clotrimazole-betamethasone (LOTRISONE) cream Apply 1 application topically 2 (two) times daily. Prn leg dermatitis (Patient taking  differently: Apply 1 application topically 2 (two) times daily as needed (dermatitis).) 45 g 0   Colchicine (MITIGARE) 0.6 MG CAPS Take 1 capsule by mouth as needed. As needed gout flare max # of pills 2 in 24 hours 30 capsule 11   fluticasone (FLONASE) 50 MCG/ACT nasal spray Place into both nostrils daily as needed for allergies or rhinitis.     Fluticasone-Salmeterol (ADVAIR) 250-50 MCG/DOSE AEPB Inhale 1 puff into the lungs 2 (two) times daily as needed. Rinse mouth 60 each 12   ibuprofen (ADVIL) 800 MG tablet Take 1 tablet (800 mg total) by mouth every 8 (eight) hours as needed. 90 tablet 2   ipratropium-albuterol (DUONEB) 0.5-2.5 (3) MG/3ML SOLN Take 3 mLs by nebulization every 4 (four) hours as  needed. 360 mL 12   loratadine (CLARITIN) 10 MG tablet Take 10 mg by mouth daily.     Magnesium Cl-Calcium Carbonate (SLOW MAGNESIUM/CALCIUM) 70-117 MG TBEC Take 2 tablets by mouth in the morning and at bedtime.     metoprolol tartrate (LOPRESSOR) 25 MG tablet Take 1 tablet (25 mg total) by mouth 2 (two) times daily. 180 tablet 3   Multiple Vitamin (MULTIVITAMIN) tablet Take 1 tablet by mouth daily.     omeprazole (PRILOSEC) 40 MG capsule Take 1 capsule (40 mg total) by mouth daily. 30 min before food 90 capsule 3   OPSUMIT 10 MG TABS Take 10 mg by mouth daily.  8   Polyethyl Glycol-Propyl Glycol (SYSTANE OP) Place 1 drop into both eyes 3 (three) times daily as needed (dry/irritated eyes.).     potassium chloride SA (KLOR-CON) 20 MEQ tablet Take 1 tablet (20 mEq total) by mouth daily.     predniSONE (DELTASONE) 20 MG tablet 40 mg ( 2pills) x 5 days, then 20 mg (1 pill x 4 days), then 1/2 pill x 1 day then stop. Take in am with food 15 tablet 0   rivaroxaban (XARELTO) 20 MG TABS tablet Take 20 mg by mouth daily with supper.     sildenafil (REVATIO) 20 MG tablet Take 1-2 tablets (20-40 mg total) by mouth 3 (three) times daily. As of 07/03/20 taking 20 mg tid per [facility] Dr. Harrietta Guardian (Patient taking differently: Take 40 mg by mouth 3 (three) times daily.) 10 tablet 0   spironolactone (ALDACTONE) 50 MG tablet Take 1 tablet (50 mg total) by mouth daily. 90 tablet 3   torsemide (DEMADEX) 100 MG tablet Take 100 mg by mouth daily.      treprostinil (REMODULIN) 20 MG/20ML injection 20 ng/kg/min by Continuous infusion (non-IV) route continuous.     triamcinolone cream (KENALOG) 0.1 % Apply 1 application topically daily as needed (leg discoloration). 454 g 2   No current facility-administered medications for this encounter.   Facility-Administered Medications Ordered in Other Encounters  Medication Dose Route Frequency Provider Last Rate Last Admin   heparin lock flush 100 unit/mL  500 Units Intracatheter  Once PRN Nicholas Lose, MD       sodium chloride flush (NS) 0.9 % injection 10 mL  10 mL Intracatheter PRN Nicholas Lose, MD        Allergies  Allergen Reactions   Ampicillin Other (See Comments)    Severe abdominal pain, dizziness (penicillin is okay)   Amoxicillin Other (See Comments)    LIGHT-HEADED and CLOSE TO "PASSING OUT"  AND ABDOMINAL PAIN Has patient had a PCN reaction causing immediate rash, facial/tongue/throat swelling, SOB or lightheadedness with hypotension: No Has patient had a PCN reaction  causing severe rash involving mucus membranes or skin necrosis: No Has patient had a PCN reaction that required hospitalization No Has patient had a PCN reaction occurring within the last 10 years: No If all of the above answers are "NO", then may proceed with Cephalosporin use.   Indomethacin Other (See Comments)    Gastro irritation   Nsaids     GI ulcers    Metronidazole Nausea And Vomiting   Sulfa Antibiotics Rash and Other (See Comments)    Very bad yeast infection      Social History   Socioeconomic History   Marital status: Single    Spouse name: Not on file   Number of children: 1   Years of education: Not on file   Highest education level: Not on file  Occupational History   Not on file  Tobacco Use   Smoking status: Former    Packs/day: 0.33    Years: 10.00    Pack years: 3.30    Types: Cigarettes    Quit date: 10/04/2011    Years since quitting: 9.4   Smokeless tobacco: Never  Vaping Use   Vaping Use: Never used  Substance and Sexual Activity   Alcohol use: Yes    Alcohol/week: 0.0 standard drinks    Comment: social   Drug use: No   Sexual activity: Yes  Other Topics Concern   Not on file  Social History Narrative   Single, lives alone with her children   Lives in [place]    worked Customer service manager at [employer]    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 [employer]   As of 2022 on disability due to pulm artery htn       Children: boys   Grew up in  [place] Determinants of Health   Financial Resource Strain: Not on [employer] Insecurity: Not on file  Transportation Needs: Not on file  Physical Activity: Not on file  Stress: Not on file  Social Connections: Not on file  Intimate Partner Violence: Not on file      Family History  Problem Relation Age of Onset   Hypertension Mother    Kidney disease Mother    Hypertension Father    Stroke Sister    Breast cancer Maternal Grandmother        died at 8   Breast cancer Paternal Grandmother    Breast cancer Cousin        pat first cousin dx in her 45s   Hypertension Brother    Cancer Maternal Aunt        unknown form   Breast cancer Paternal Aunt    Colon cancer Other 17       MGMs brother   Breast cancer Paternal Aunt    Cancer Other        breast ca in [place]   Cancer Other        g uncle colon or stomach ca    There were no vitals filed for this visit.   PHYSICAL EXAM: General:  Well appearing. No respiratory difficulty HEENT: normal Neck: supple. JVP 6-7 Carotids 2+ bilat; no bruits. No lymphadenopathy or thryomegaly appreciated. Cor: PMI nondisplaced. Regular rate & rhythm. No rubs, gallops or murmurs. Lungs: clear Abdomen: obese soft, nontender, nondistended. No hepatosplenomegaly. No bruits or masses. Good bowel sounds. Extremities: no cyanosis, clubbing, rash, edema Neuro: alert & oriented x 3, cranial nerves grossly intact. moves all 4 extremities w/o difficulty. Affect pleasant.  ASSESSMENT & PLAN:    1. PAH  - WHO Group I due to [place]. H/o PE but pulmonary angiography 2017 without chronic PE - Followed closely at [facility] on triple therapy with IV treprostinil (started 10/20), sildenafil 40 tid and macitentan 10 daily - NYHA III  - Echo 8/22 LVEF 60-65% RV moderately dilated and HK. TR jet inadequate to estimate RVSP.  - RHC planned at [facility] tomorrow - We will defer further management of her PAH to Dr.LeVarge and the [facility] team.  -  She can f/u as needed  Glori Bickers, MD  10:01 PM

## 2021-03-10 ENCOUNTER — Inpatient Hospital Stay (HOSPITAL_COMMUNITY)
Admission: RE | Admit: 2021-03-10 | Discharge: 2021-03-10 | Disposition: A | Discharge status: Home / Self Care | Payer: 59 | Source: Ambulatory Visit | Attending: Internal Medicine | Admitting: Internal Medicine

## 2021-03-10 DIAGNOSIS — I5022 Chronic systolic (congestive) heart failure: Secondary | ICD-10-CM

## 2021-03-10 DIAGNOSIS — I272 Pulmonary hypertension, unspecified: Secondary | ICD-10-CM

## 2021-03-20 LAB — HM MAMMOGRAPHY

## 2021-03-26 ENCOUNTER — Encounter: Payer: Self-pay | Admitting: Internal Medicine

## 2021-03-27 ENCOUNTER — Encounter: Payer: Self-pay | Admitting: Internal Medicine

## 2021-04-17 ENCOUNTER — Ambulatory Visit (HOSPITAL_COMMUNITY): Admission: RE | Admit: 2021-04-17 | Payer: 59 | Source: Ambulatory Visit | Admitting: Internal Medicine

## 2021-04-17 ENCOUNTER — Other Ambulatory Visit: Payer: Self-pay

## 2021-04-17 ENCOUNTER — Encounter: Payer: Self-pay | Admitting: Internal Medicine

## 2021-04-18 ENCOUNTER — Other Ambulatory Visit: Payer: Self-pay | Admitting: Internal Medicine

## 2021-04-18 DIAGNOSIS — I10 Essential (primary) hypertension: Secondary | ICD-10-CM

## 2021-05-07 ENCOUNTER — Other Ambulatory Visit: Payer: Self-pay | Admitting: Internal Medicine

## 2021-05-07 DIAGNOSIS — J309 Allergic rhinitis, unspecified: Secondary | ICD-10-CM

## 2021-05-07 DIAGNOSIS — R0982 Postnasal drip: Secondary | ICD-10-CM

## 2021-05-16 ENCOUNTER — Encounter (HOSPITAL_COMMUNITY): Payer: 59 | Admitting: Internal Medicine

## 2021-05-23 ENCOUNTER — Telehealth (INDEPENDENT_AMBULATORY_CARE_PROVIDER_SITE_OTHER): Payer: 59 | Admitting: Internal Medicine

## 2021-05-23 ENCOUNTER — Other Ambulatory Visit: Payer: Self-pay

## 2021-05-23 ENCOUNTER — Encounter: Payer: Self-pay | Admitting: Internal Medicine

## 2021-05-23 DIAGNOSIS — B370 Candidal stomatitis: Secondary | ICD-10-CM

## 2021-05-23 DIAGNOSIS — J029 Acute pharyngitis, unspecified: Secondary | ICD-10-CM

## 2021-05-23 DIAGNOSIS — B3731 Acute candidiasis of vulva and vagina: Secondary | ICD-10-CM | POA: Diagnosis not present

## 2021-05-23 MED ORDER — FLUCONAZOLE 150 MG PO TABS: 150.0000 mg | ORAL_TABLET | Freq: Once | ORAL | 0 refills | Status: AC

## 2021-05-23 MED ORDER — AZITHROMYCIN 250 MG PO TABS: ORAL_TABLET | ORAL | 0 refills | Status: AC

## 2021-05-23 MED ORDER — NYSTATIN 100000 UNIT/ML MT SUSP: 5.0000 mL | Freq: Four times a day (QID) | OROMUCOSAL | 0 refills | Status: DC

## 2021-05-23 NOTE — Progress Notes (Signed)
Virtual Visit via Video Note  I connected with [name]  on 05/23/21 at  1:00 PM EST by a video enabled telemedicine application and verified that I am speaking with the correct person using two identifiers.  Location patient: [place] Location provider:work or home office Persons participating in the virtual visit: patient, provider  I discussed the limitations and requested verbal permission for telemedicine visit. The patient expressed understanding and agreed to proceed.   HPI:  Acute telemedicine visit for : Sore throat x 4 days in a study with [facility] tried warm salt gargles and tylenol throat is red no white dots and coughing yellow phelgm  Uses bipap at night but cleans it and has humidifications   -Pertinent past medical history: see below -Pertinent medication allergies: Allergies  Allergen Reactions   Ampicillin Other (See Comments)    Severe abdominal pain, dizziness (penicillin is okay)   Amoxicillin Other (See Comments)    LIGHT-HEADED and CLOSE TO "PASSING OUT"  AND ABDOMINAL PAIN Has patient had a PCN reaction causing immediate rash, facial/tongue/throat swelling, SOB or lightheadedness with hypotension: No Has patient had a PCN reaction causing severe rash involving mucus membranes or skin necrosis: No Has patient had a PCN reaction that required hospitalization No Has patient had a PCN reaction occurring within the last 10 years: No If all of the above answers are "NO", then may proceed with Cephalosporin use.   Indomethacin Other (See Comments)    Gastro irritation   Nsaids     GI ulcers    Metronidazole Nausea And Vomiting   Sulfa Antibiotics Rash and Other (See Comments)    Very bad yeast infection   -COVID-19 vaccine status:  Immunization History  Administered Date(s) Administered   Hepatitis A 03/21/2018   Hepatitis A, Adult 03/21/2018, 03/14/2020   Hepatitis B 03/21/2018   Hepb-cpg 03/21/2018   Influenza Split 01/08/2015   Influenza,inj,Quad  PF,6+ Mos 01/15/2020, 01/03/2021   Influenza-Unspecified 01/08/2015, 01/24/2016, 12/28/2017, 11/29/2018   PFIZER(Purple Top)SARS-COV-2 Vaccination 05/26/2019, 06/23/2019, 03/15/2020   Pneumococcal Conjugate-13 10/25/2020   Tdap 06/21/2017     ROS: See pertinent positives and negatives per HPI.  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   Menorrhagia    secondary to uterine fibroids   OSA (obstructive sleep apnea)    07/25/13 HST AHI 33/hr, severe hypoxemia O2 min 42% and 95% of the time <89%   PE (pulmonary embolism) 2014   bilateral   Prediabetes    Pulmonary artery hypertension ([facility])    Pulmonary nodule    (66m on loeft lower lobe) found on CT scan July 2014, repeat scan Jan 2015 showed less than 434m  Right ovarian cyst    noted 09/2012    S/P TAH (total abdominal hysterectomy) 06/07/2013   Trichomoniasis    05/2011     Past Surgical History:  Procedure Laterality Date   ABDOMINAL HYSTERECTOMY N/A 06/07/2013   Procedure: HYSTERECTOMY ABDOMINAL WITH BIALTERAL SALPINGECTOMY;  Surgeon: VaElveria RoyalsMD;  [facility];  Service: Gynecology;  Laterality: N/A;   BIOPSY  09/02/2017   Procedure: BIOPSY;  Surgeon: Wilford Corner, MD;  [facility];  Service: Endoscopy;;   BIOPSY  12/31/2020   Procedure: BIOPSY;  Surgeon: Wilford Corner, MD;  [facility]: [facility];  Service: Endoscopy;;   BREAST LUMPECTOMY Left 07/27/2016   BREAST LUMPECTOMY WITH RADIOACTIVE SEED AND SENTINEL LYMPH NODE BIOPSY (Left)   BREAST LUMPECTOMY WITH RADIOACTIVE SEED AND SENTINEL LYMPH NODE BIOPSY Left  07/27/2016   Procedure: BREAST LUMPECTOMY WITH RADIOACTIVE SEED AND SENTINEL LYMPH NODE BIOPSY;  Surgeon: Stark Klein, MD;  Location: [facility];  Service: General;  Laterality: Left;   CARDIAC CATHETERIZATION N/A 12/24/2015   Procedure: Right Heart Cath;  Surgeon: [name], MD;  Location: [place] [facility];  Service: Cardiovascular;  Laterality: N/A;   CESAREAN SECTION     COLONOSCOPY     COLONOSCOPY WITH PROPOFOL N/A 09/02/2017   Procedure: COLONOSCOPY WITH PROPOFOL;  Surgeon: Wilford Corner, MD;  [facility];  Service: Endoscopy;  Laterality: N/A;   COLONOSCOPY WITH PROPOFOL Bilateral 12/31/2020   Procedure: COLONOSCOPY WITH PROPOFOL;  Surgeon: Wilford Corner, MD;  [facility];  Service: Endoscopy;  Laterality: Bilateral;   ESOPHAGOGASTRODUODENOSCOPY (EGD) WITH PROPOFOL N/A 09/02/2017   Procedure: ESOPHAGOGASTRODUODENOSCOPY (EGD) WITH PROPOFOL;  Surgeon: Wilford Corner, MD;  [facility];  Service: Endoscopy;  Laterality: N/A;   ESOPHAGOGASTRODUODENOSCOPY (EGD) WITH PROPOFOL Bilateral 12/31/2020   Procedure: ESOPHAGOGASTRODUODENOSCOPY (EGD) WITH PROPOFOL;  Surgeon: Wilford Corner, MD;  [facility];  Service: Endoscopy;  Laterality: Bilateral;   IR CV LINE INJECTION  07/27/2017   POLYPECTOMY  2008   Removal of uterine polyp   PORT-A-CATH REMOVAL Right 09/09/2017   Procedure: REMOVAL PORT-A-CATH;  Surgeon: Stark Klein, MD;  Location: [facility];  Service: General;  Laterality: Right;   PORTA CATH INSERTION  07/27/2016   PORTACATH PLACEMENT Right 07/27/2016   Procedure: INSERTION PORT-A-CATH;  Surgeon: Stark Klein, MD;  Location: [place];  Service: General;  Laterality: Right;   SUBMUCOSAL INJECTION  09/02/2017   Procedure: SUBMUCOSAL INJECTION;  Surgeon: Wilford Corner, MD;  [facility];  Service: Endoscopy;;  epi injection     Current Outpatient Medications:    acetaminophen (TYLENOL) 500 MG tablet, Take 1,000 mg by  mouth every 6 (six) hours as needed for moderate pain or headache. , Disp: , Rfl:    albuterol (VENTOLIN HFA) 108 (90 Base) MCG/ACT inhaler, Inhale 1-2 puffs into the lungs every 6 (six) hours as needed for wheezing or shortness of breath., Disp: 18 g, Rfl: 11   allopurinol (ZYLOPRIM) 300 MG tablet, TAKE 1 TABLET BY MOUTH EVERY DAY NEED APPT, Disp: 90 tablet, Rfl: 1   anastrozole (ARIMIDEX) 1 MG tablet, Take 1 tablet (1 mg total) by mouth daily. (Patient taking differently: Take 1 mg by mouth every evening.), Disp: 90 tablet, Rfl: 3   Azelastine HCl 137 MCG/SPRAY SOLN, PLACE 1 SPRAY INTO BOTH NOSTRILS 2 (TWO) TIMES DAILY. USE IN EACH NOSTRIL AS DIRECTED, Disp: 30 mL, Rfl: 4   azithromycin (ZITHROMAX) 250 MG tablet, With food Take 2 tablets on day 1, then 1 tablet daily on days 2 through 5, Disp: 6 tablet, Rfl: 0   Cholecalciferol (VITAMIN D3) 1.25 MG (50000 UT) CAPS, TAKE 1 CAPSULE BY MOUTH ONCE A WEEK, Disp: 13 capsule, Rfl: 1   clotrimazole-betamethasone (LOTRISONE) cream, Apply 1 application topically 2 (two) times daily. Prn leg dermatitis (Patient taking differently: Apply 1 application topically 2 (two) times daily  as needed (dermatitis).), Disp: 45 g, Rfl: 0   Colchicine (MITIGARE) 0.6 MG CAPS, Take 1 capsule by mouth as needed. As needed gout flare max # of pills 2 in 24 hours, Disp: 30 capsule, Rfl: 11   fluconazole (DIFLUCAN) 150 MG tablet, Take 1 tablet (150 mg total) by mouth once for 1 dose., Disp: 1 tablet, Rfl: 0   fluticasone (FLONASE) 50 MCG/ACT nasal spray, Place into both nostrils daily as needed for allergies or rhinitis., Disp: , Rfl:    Fluticasone-Salmeterol (ADVAIR) 250-50 MCG/DOSE AEPB, Inhale 1 puff into the lungs 2 (two) times daily as needed. Rinse mouth, Disp: 60 each, Rfl: 12   ibuprofen (ADVIL) 800 MG tablet, Take 1 tablet (800 mg total) by mouth every 8 (eight) hours as needed., Disp: 90 tablet, Rfl: 2   ipratropium-albuterol (DUONEB) 0.5-2.5 (3) MG/3ML SOLN, Take 3 mLs  by nebulization every 4 (four) hours as needed., Disp: 360 mL, Rfl: 12   loratadine (CLARITIN) 10 MG tablet, Take 10 mg by mouth daily., Disp: , Rfl:    Magnesium Cl-Calcium Carbonate (SLOW MAGNESIUM/CALCIUM) 70-117 MG TBEC, Take 2 tablets by mouth in the morning and at bedtime., Disp: , Rfl:    metoprolol tartrate (LOPRESSOR) 25 MG tablet, TAKE 1 TABLET BY MOUTH TWICE A DAY, Disp: 180 tablet, Rfl: 3   Multiple Vitamin (MULTIVITAMIN) tablet, Take 1 tablet by mouth daily., Disp: , Rfl:    nystatin (MYCOSTATIN) 100000 UNIT/ML suspension, Take 5 mLs (500,000 Units total) by mouth 4 (four) times daily., Disp: 473 mL, Rfl: 0   omeprazole (PRILOSEC) 40 MG capsule, Take 1 capsule (40 mg total) by mouth daily. 30 min before food, Disp: 90 capsule, Rfl: 3   OPSUMIT 10 MG TABS, Take 10 mg by mouth daily., Disp: , Rfl: 8   Polyethyl Glycol-Propyl Glycol (SYSTANE OP), Place 1 drop into both eyes 3 (three) times daily as needed (dry/irritated eyes.)., Disp: , Rfl:    potassium chloride SA (KLOR-CON) 20 MEQ tablet, Take 1 tablet (20 mEq total) by mouth daily., Disp: , Rfl:    predniSONE (DELTASONE) 20 MG tablet, 40 mg ( 2pills) x 5 days, then 20 mg (1 pill x 4 days), then 1/2 pill x 1 day then stop. Take in am with food, Disp: 15 tablet, Rfl: 0   rivaroxaban (XARELTO) 20 MG TABS tablet, Take 20 mg by mouth daily with supper., Disp: , Rfl:    sildenafil (REVATIO) 20 MG tablet, Take 1-2 tablets (20-40 mg total) by mouth 3 (three) times daily. As of 07/03/20 taking 20 mg tid per [facility] Dr. Harrietta Guardian (Patient taking differently: Take 40 mg by mouth 3 (three) times daily.), Disp: 10 tablet, Rfl: 0   spironolactone (ALDACTONE) 50 MG tablet, Take 1 tablet (50 mg total) by mouth daily., Disp: 90 tablet, Rfl: 3   torsemide (DEMADEX) 100 MG tablet, Take 100 mg by mouth daily. , Disp: , Rfl:    treprostinil (REMODULIN) 20 MG/20ML injection, 20 ng/kg/min by Continuous infusion (non-IV) route continuous., Disp: , Rfl:     triamcinolone cream (KENALOG) 0.1 %, Apply 1 application topically daily as needed (leg discoloration)., Disp: 454 g, Rfl: 2 No current facility-administered medications for this visit.  Facility-Administered Medications Ordered in Other Visits:    heparin lock flush 100 unit/mL, 500 Units, Intracatheter, Once PRN, Lindi Adie, Vinay, MD   sodium chloride flush (NS) 0.9 % injection 10 mL, 10 mL, Intracatheter, PRN, Nicholas Lose, MD  EXAM:  VITALS per patient if applicable:  GENERAL:  alert, oriented, appears well and in no acute distress  HEENT: atraumatic, conjunttiva clear, no obvious abnormalities on inspection of external nose and ears  NECK: normal movements of the head and neck  LUNGS: on inspection no signs of respiratory distress, breathing rate appears normal, no obvious gross SOB, gasping or wheezing  CV: no obvious cyanosis  MS: moves all visible extremities without noticeable abnormality  PSYCH/NEURO: pleasant and cooperative, no obvious depression or anxiety, speech and thought processing grossly intact  ASSESSMENT AND PLAN:  Discussed the following assessment and plan:  Sore throat - Plan: azithromycin (ZITHROMAX) 250 MG tablet  Thrush ? Related to study med I.e inhaler from [facility]- Plan: fluconazole (DIFLUCAN) 150 MG tablet, nystatin (MYCOSTATIN) 100000 UNIT/ML suspension  Yeast vaginitis - Plan: fluconazole (DIFLUCAN) 150 MG tablet  -we discussed possible serious and likely etiologies, options for evaluation and workup, limitations of telemedicine visit vs in person visit, treatment, treatment risks and precautions. Pt is agreeable to treatment via telemedicine at this moment.   t was provided an opportunity to ask questions and all were answered. The patient agreed with the plan and demonstrated an understanding of the instructions.    Time spent 20 min [name], MD

## 2021-06-13 ENCOUNTER — Encounter: Payer: Self-pay | Admitting: Internal Medicine

## 2021-06-18 ENCOUNTER — Telehealth: Payer: Self-pay | Admitting: Internal Medicine

## 2021-06-18 NOTE — Telephone Encounter (Signed)
Pt called about the [place] medication and wanted to know the status and if the provider was taking [employer]. Pt want to be called. ?

## 2021-06-19 NOTE — Telephone Encounter (Signed)
Spoke with front office lead, Informed that we do take [facility].  ? ?Please advise, were you going to send in [facility] for the Patient?  ?

## 2021-06-19 NOTE — Telephone Encounter (Signed)
We can disc at appt 07/03/21 I dont think ive discussed this with her ?

## 2021-06-23 NOTE — Telephone Encounter (Signed)
Patient notified and advised patient she would need to let [employer] know whom she prefers as provider to change provider on insurance card. ?

## 2021-07-03 ENCOUNTER — Ambulatory Visit (INDEPENDENT_AMBULATORY_CARE_PROVIDER_SITE_OTHER): Payer: Self-pay | Admitting: Internal Medicine

## 2021-07-03 ENCOUNTER — Encounter: Payer: Self-pay | Admitting: Internal Medicine

## 2021-07-03 ENCOUNTER — Other Ambulatory Visit: Payer: Self-pay | Admitting: Internal Medicine

## 2021-07-03 VITALS — BP 118/78 | HR 86 | Temp 98.1°F | Ht 65.0 in | Wt 247.2 lb

## 2021-07-03 DIAGNOSIS — J9801 Acute bronchospasm: Secondary | ICD-10-CM

## 2021-07-03 DIAGNOSIS — R7303 Prediabetes: Secondary | ICD-10-CM

## 2021-07-03 DIAGNOSIS — N3000 Acute cystitis without hematuria: Secondary | ICD-10-CM

## 2021-07-03 DIAGNOSIS — E785 Hyperlipidemia, unspecified: Secondary | ICD-10-CM

## 2021-07-03 DIAGNOSIS — E559 Vitamin D deficiency, unspecified: Secondary | ICD-10-CM

## 2021-07-03 DIAGNOSIS — R7989 Other specified abnormal findings of blood chemistry: Secondary | ICD-10-CM

## 2021-07-03 DIAGNOSIS — R790 Abnormal level of blood mineral: Secondary | ICD-10-CM

## 2021-07-03 DIAGNOSIS — D649 Anemia, unspecified: Secondary | ICD-10-CM

## 2021-07-03 DIAGNOSIS — I272 Pulmonary hypertension, unspecified: Secondary | ICD-10-CM

## 2021-07-03 DIAGNOSIS — R5383 Other fatigue: Secondary | ICD-10-CM

## 2021-07-03 DIAGNOSIS — J9611 Chronic respiratory failure with hypoxia: Secondary | ICD-10-CM

## 2021-07-03 DIAGNOSIS — Z6841 Body Mass Index (BMI) 40.0 and over, adult: Secondary | ICD-10-CM

## 2021-07-03 LAB — LIPID PANEL
Cholesterol: 165 mg/dL (ref 0–200)
HDL: 44.5 mg/dL (ref >39.00)
LDL Cholesterol: 97 mg/dL (ref 0–99)
NonHDL: 120.27
Total CHOL/HDL Ratio: 4
Triglycerides: 118 mg/dL (ref 0.0–149.0)
VLDL: 23.6 mg/dL (ref 0.0–40.0)

## 2021-07-03 LAB — CBC WITH DIFFERENTIAL/PLATELET
Basophils Absolute: 0 10*3/uL (ref 0.0–0.1)
Basophils Relative: 0.4 % (ref 0.0–3.0)
Eosinophils Absolute: 0.1 10*3/uL (ref 0.0–0.7)
Eosinophils Relative: 2.4 % (ref 0.0–5.0)
HCT: 34.2 % — ABNORMAL LOW (ref 36.0–46.0)
Hemoglobin: 11.5 g/dL — ABNORMAL LOW (ref 12.0–15.0)
Lymphocytes Relative: 15.2 % (ref 12.0–46.0)
Lymphs Abs: 1 10*3/uL (ref 0.7–4.0)
MCHC: 33.7 g/dL (ref 30.0–36.0)
MCV: 94.4 fl (ref 78.0–100.0)
Monocytes Absolute: 0.3 10*3/uL (ref 0.1–1.0)
Monocytes Relative: 4.9 % (ref 3.0–12.0)
Neutro Abs: 4.8 10*3/uL (ref 1.4–7.7)
Neutrophils Relative %: 77.1 % — ABNORMAL HIGH (ref 43.0–77.0)
Platelets: 124 10*3/uL — ABNORMAL LOW (ref 150.0–400.0)
RBC: 3.62 Mil/uL — ABNORMAL LOW (ref 3.87–5.11)
RDW: 14.6 % (ref 11.5–15.5)
WBC: 6.2 10*3/uL (ref 4.0–10.5)

## 2021-07-03 LAB — VITAMIN D 25 HYDROXY (VIT D DEFICIENCY, FRACTURES): VITD: 30.38 ng/mL (ref 30.00–100.00)

## 2021-07-03 LAB — HEMOGLOBIN A1C: Hgb A1c MFr Bld: 5.5 % (ref 4.6–6.5)

## 2021-07-03 LAB — TSH: TSH: 0.82 u[IU]/mL (ref 0.35–5.50)

## 2021-07-03 LAB — PHOSPHORUS: Phosphorus: 3.3 mg/dL (ref 2.3–4.6)

## 2021-07-03 LAB — MAGNESIUM: Magnesium: 1.3 mg/dL — ABNORMAL LOW (ref 1.5–2.5)

## 2021-07-03 MED ORDER — WEGOVY 0.5 MG/0.5ML ~~LOC~~ SOAJ: 0.5000 mg | SUBCUTANEOUS | 0 refills | Status: DC

## 2021-07-03 MED ORDER — WEGOVY 2.4 MG/0.75ML ~~LOC~~ SOAJ: 2.4000 mg | SUBCUTANEOUS | 2 refills | Status: DC

## 2021-07-03 MED ORDER — WEGOVY 1.7 MG/0.75ML ~~LOC~~ SOAJ: 1.7000 mg | SUBCUTANEOUS | 0 refills | Status: DC

## 2021-07-03 MED ORDER — ALBUTEROL SULFATE HFA 108 (90 BASE) MCG/ACT IN AERS: 1.0000 | INHALATION_SPRAY | Freq: Four times a day (QID) | RESPIRATORY_TRACT | 11 refills | Status: DC | PRN

## 2021-07-03 MED ORDER — ALBUTEROL SULFATE 1.25 MG/3ML IN NEBU: 1.0000 | INHALATION_SOLUTION | Freq: Four times a day (QID) | RESPIRATORY_TRACT | 12 refills | Status: DC | PRN

## 2021-07-03 MED ORDER — WEGOVY 1 MG/0.5ML ~~LOC~~ SOAJ: 1.0000 mg | SUBCUTANEOUS | 0 refills | Status: DC

## 2021-07-03 MED ORDER — CIPROFLOXACIN HCL 500 MG PO TABS: 500.0000 mg | ORAL_TABLET | Freq: Two times a day (BID) | ORAL | 0 refills | Status: AC

## 2021-07-03 MED ORDER — WEGOVY 0.25 MG/0.5ML ~~LOC~~ SOAJ: 0.2500 mg | SUBCUTANEOUS | 0 refills | Status: DC

## 2021-07-03 MED ORDER — FLUTICASONE-SALMETEROL 250-50 MCG/ACT IN AEPB: 1.0000 | INHALATION_SPRAY | Freq: Two times a day (BID) | RESPIRATORY_TRACT | 11 refills | Status: DC

## 2021-07-03 NOTE — Progress Notes (Signed)
Chief Complaint  ?Patient presents with  ? Follow-up  ? ?F/u  ?1. Pulm htn on torsemide 100 mg qd and spironolactone 25 mg bid with Dr. Harrietta Guardian cardiac cath right sch 07/22/21 she feels her legs, hands feet and face swollen for the last month previously could not tolertate zaroxyln 2.5 qd prn  ?Check labs h/o low mag, K managed by pulm Dr. Harrietta Guardian ? ?2. Morbid obesity wegovy is not covered by her insurance she wants referral to wt loss clinic ?3. C/w UTI check urine today c/o increase urgency ? ?Review of Systems  ?Constitutional:  Positive for malaise/fatigue. Negative for weight loss.  ?     Wt up 7-10 lbs  ?HENT:  Negative for hearing loss.   ?Eyes:  Negative for blurred vision.  ?Respiratory:  Positive for shortness of breath.   ?Cardiovascular:  Positive for leg swelling. Negative for chest pain.  ?Gastrointestinal:  Negative for abdominal pain and blood in stool.  ?Genitourinary:  Positive for urgency. Negative for dysuria.  ?Musculoskeletal:  Negative for falls and joint pain.  ?Skin:  Negative for rash.  ?Neurological:  Negative for headaches.  ?Psychiatric/Behavioral:  Negative for depression.   ?Past Medical History:  ?Diagnosis Date  ? Anemia   ? iron deficiency  ? Arthritis   ? Borderline diabetes   ? Complication of anesthesia   ? woke up slowly- after hysterectomy- 2015  ? COVID-19   ? Diabetes mellitus, type II ([place])   ? DVT (deep venous thrombosis) ([place]) 2014  ? left leg  ? Family history of breast cancer   ? Gout   ? HTN (hypertension)   ? 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 Menorrhagia   ? secondary to uterine fibroids  ? OSA (obstructive sleep apnea)   ? 07/25/13 HST AHI 33/hr, severe hypoxemia O2 min 42% and 95% of the time <89%  ? PE (pulmonary embolism) 2014  ? bilateral  ? Prediabetes   ? Pulmonary artery hypertension ([place])   ? Pulmonary nodule   ? (40m on loeft lower lobe) found on CT scan July 2014, repeat scan Jan 2015 showed less than 410m ?  Right ovarian cyst   ? noted 09/2012   ? S/P TAH (total abdominal hysterectomy) 06/07/2013  ? Trichomoniasis   ? 05/2011   ? ?Past Surgical History:  ?Procedure Laterality Date  ? ABDOMINAL HYSTERECTOMY N/A 06/07/2013  ? Procedure: HYSTERECTOMY ABDOMINAL WITH BIALTERAL SALPINGECTOMY;  Surgeon: VaElveria RoyalsMD;  [facility];  Service: Gynecology;  Laterality: N/A;  ? BIOPSY  09/02/2017  ? Procedure: BIOPSY;  Surgeon: ScWilford CornerMD;  [facility];  Service: Endoscopy;;  ? BIOPSY  12/31/2020  ? Procedure: BIOPSY;  Surgeon: ScWilford CornerMD;  [facility];  Service: Endoscopy;;  ? BREAST LUMPECTOMY Left 07/27/2016  ? BREAST LUMPECTOMY WITH RADIOACTIVE SEED AND SENTINEL LYMPH NODE BIOPSY (Left)  ? BREAST LUMPECTOMY WITH RADIOACTIVE SEED AND SENTINEL LYMPH NODE BIOPSY Left 07/27/2016  ? Procedure: BREAST LUMPECTOMY WITH RADIOACTIVE SEED AND SENTINEL LYMPH NODE BIOPSY;  Surgeon: FaStark KleinMD;  [facility] Service: General;  Laterality: Left;  ? CARDIAC CATHETERIZATION N/A 12/24/2015  ? Procedure: Right Heart Cath;  Surgeon: JaAdrian ProwsMD;  [facility];  Service: Cardiovascular;  Laterality: N/A;  ? CESAREAN SECTION    ? COLONOSCOPY    ? COLONOSCOPY WITH PROPOFOL N/A 09/02/2017  ? Procedure: COLONOSCOPY WITH PROPOFOL;  Surgeon: ScWilford Corner  MD;  [facility];  Service: Endoscopy;  Laterality: N/A;  ? COLONOSCOPY WITH PROPOFOL Bilateral 12/31/2020  ? Procedure: COLONOSCOPY WITH PROPOFOL;  Surgeon: Wilford Corner, MD;  [facility];  Service: Endoscopy;  Laterality: Bilateral;  ? ESOPHAGOGASTRODUODENOSCOPY (EGD) WITH PROPOFOL N/A 09/02/2017  ? Procedure: ESOPHAGOGASTRODUODENOSCOPY (EGD) WITH PROPOFOL;  Surgeon: Wilford Corner, MD;  [facility];  Service: Endoscopy;  Laterality: N/A;  ? ESOPHAGOGASTRODUODENOSCOPY (EGD) WITH PROPOFOL Bilateral 12/31/2020  ? Procedure: ESOPHAGOGASTRODUODENOSCOPY (EGD) WITH PROPOFOL;  Surgeon:  Wilford Corner, MD;  [facility];  Service: Endoscopy;  Laterality: Bilateral;  ? IR CV LINE INJECTION  07/27/2017  ? POLYPECTOMY  2008  ? Removal of uterine polyp  ? PORT-A-CATH REMOVAL Right 09/09/2017  ? Procedure: REMOVAL PORT-A-CATH;  Surgeon: Stark Klein, MD;  Location: [facility];  Service: General;  Laterality: Right;  ? PORTA CATH INSERTION  07/27/2016  ? PORTACATH PLACEMENT Right 07/27/2016  ? Procedure: INSERTION PORT-A-CATH;  Surgeon: Stark Klein, MD;  Location: [facility];  Service: General;  Laterality: Right;  ? SUBMUCOSAL INJECTION  09/02/2017  ? Procedure: SUBMUCOSAL INJECTION;  Surgeon: Wilford Corner, MD;  [facility];  Service: Endoscopy;;  epi injection  ? ?Family History  ?Problem Relation Age of Onset  ? Hypertension Mother   ? Kidney disease Mother   ? Hypertension Father   ? Stroke Sister   ? Breast cancer Maternal Grandmother   ?     died at 28  ? Breast cancer Paternal Grandmother   ? Breast cancer Cousin   ?     pat first cousin dx in her 37s  ? Hypertension Brother   ? Cancer Maternal Aunt   ?     unknown form  ? Breast cancer Paternal Aunt   ? Colon cancer Other 22  ?     MGMs brother  ? Breast cancer Paternal Aunt   ? Cancer Other   ?     breast ca in [place]  ? Cancer Other   ?     g uncle colon or stomach ca  ? ?Social History  ? ?Socioeconomic History  ? Marital status: Single  ?  Spouse name: Not on file  ? Number of children: 1  ? Years of education: Not on file  ? Highest education level: Not on file  ?Occupational History  ? Not on file  ?Tobacco Use  ? Smoking status: Former  ?  Packs/day: 0.33  ?  Years: 10.00  ?  Pack years: 3.30  ?  Types: Cigarettes  ?  Quit date: 10/04/2011  ?  Years since quitting: 9.7  ? Smokeless tobacco: Never  ?Vaping Use  ? Vaping Use: Never used  ?Substance and Sexual Activity  ? Alcohol use: Yes  ?  Alcohol/week: 0.0 standard drinks  ?  Comment: social  ? Drug use: No  ? Sexual activity: Yes  ?Other Topics Concern  ?  Not on file  ?Social History Narrative  ? Single, lives alone with her children  ? Lives in [place]   ? worked Customer service manager at [employer]   ? 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 As of 2022 on disability due to pulm artery htn   ?   ? Children: boys  ? Grew up in [place]  ? ?Social Determinants of Health  ? ?Financial Resource Strain: Not on file  ?Food Insecurity: Not on file  ?Transportation Needs: Not on file  ?Physical Activity: Not  on file  ?Stress: Not on file  ?Social Connections: Not on file  ?Intimate Partner Violence: Not on file  ? ?Current Meds  ?Medication Sig  ? acetaminophen (TYLENOL) 500 MG tablet Take 1,000 mg by mouth every 6 (six) hours as needed for moderate pain or headache.   ? albuterol (ACCUNEB) 1.25 MG/3ML nebulizer solution Take 3 mLs (1.25 mg total) by nebulization every 6 (six) hours as needed for wheezing.  ? anastrozole (ARIMIDEX) 1 MG tablet Take 1 tablet (1 mg total) by mouth daily. (Patient taking differently: Take 1 mg by mouth every evening.)  ? Azelastine HCl 137 MCG/SPRAY SOLN PLACE 1 SPRAY INTO BOTH NOSTRILS 2 (TWO) TIMES DAILY. USE IN EACH NOSTRIL AS DIRECTED  ? Cholecalciferol (VITAMIN D3) 1.25 MG (50000 UT) CAPS TAKE 1 CAPSULE BY MOUTH ONCE A WEEK  ? [EXPIRED] ciprofloxacin (CIPRO) 500 MG tablet Take 1 tablet (500 mg total) by mouth 2 (two) times daily for 5 days.  ? clotrimazole-betamethasone (LOTRISONE) cream Apply 1 application topically 2 (two) times daily. Prn leg dermatitis (Patient taking differently: Apply 1 application. topically 2 (two) times daily as needed (dermatitis).)  ? Colchicine (MITIGARE) 0.6 MG CAPS Take 1 capsule by mouth as needed. As needed gout flare max # of pills 2 in 24 hours  ? fluticasone (FLONASE) 50 MCG/ACT nasal spray Place into both nostrils daily as needed for allergies or rhinitis.  ? fluticasone-salmeterol (ADVAIR DISKUS) 250-50 MCG/ACT AEPB Inhale 1 puff into the lungs in the morning and at bedtime. Rinse mouth  ?  ibuprofen (ADVIL) 800 MG tablet Take 1 tablet (800 mg total) by mouth every 8 (eight) hours as needed.  ? loratadine (CLARITIN) 10 MG tablet Take 10 mg by mouth daily.  ? Magnesium Cl-Calcium Carbonate (SLOW MAGNESIUM

## 2021-07-03 NOTE — Patient Instructions (Addendum)
PAH (pulmonary artery hypertension) (CMS-HCC)   ?  ?Procedures   ?Echocardiogram W Colorflow Spectral Doppler   Brock Bad, MD   ?[address]   ?[place], [place] [address]   ?Phone: [phone]   ?Fax: [phone]    ? ?Mammmogram due 02/2022  ? ?Hypomagnesemia ?Hypomagnesemia is a condition in which the level of magnesium in the blood is too low. Magnesium is a mineral that is found in many foods. It is used in many different processes in the body. Hypomagnesemia can affect every organ in the body. In severe cases, it can cause life-threatening problems. ?What are the causes? ?This condition may be caused by: ?Not getting enough magnesium in your diet or not having enough healthy foods to eat (malnutrition). ?Problems with magnesium absorption in the intestines. ?Dehydration. ?Excessive use of alcohol. ?Vomiting. ?Severe or long-term (chronic) diarrhea. ?Some medicines, including medicines that make you urinate more often (diuretics). ?Certain diseases, such as kidney disease, diabetes, celiac disease, and overactive thyroid. ?What are the signs or symptoms? ?Symptoms of this condition include: ?Loss of appetite, nausea, and vomiting. ?Involuntary shaking or trembling of a body part (tremor). ?Muscle weakness or tingling in the arms and legs. ?Sudden tightening of muscles (muscle spasms). ?Confusion. ?Psychiatric issues, such as: ?Depression and irritability. ?Psychosis. ?A feeling of fluttering of the heart (palpitations). ?Seizures. ?These symptoms are more severe if magnesium levels drop suddenly. ?How is this diagnosed? ?This condition may be diagnosed based on: ?Your symptoms and medical history. ?A physical exam. ?Blood and urine tests. ?How is this treated? ?Treatment depends on the cause and the severity of the condition. It may be treated by: ?Taking a magnesium supplement. This can be taken in pill form. If the condition is severe, magnesium is usually given through an IV. ?Making changes  to your diet.  ?You may be directed to eat foods that have a lot of magnesium, such as green leafy vegetables, peas, beans, and nuts. ?Not drinking alcohol. If you are struggling not to drink, ask your health care provider for help. ?Follow these instructions at home: ?Eating and drinking ?  ?Make sure that your diet includes foods with magnesium. Foods that have a lot of magnesium in them include: ?Green leafy vegetables, such as spinach and broccoli. ?Beans and peas. ?Nuts and seeds, such as almonds and sunflower seeds. ?Whole grains, such as whole grain bread and fortified cereals. ?Drink fluids that contain salts and minerals (electrolytes), such as sports drinks, when you are active. ?Do not drink alcohol. ?General instructions ?Take over-the-counter and prescription medicines only as told by your health care provider. ?Take magnesium supplements as directed if your health care provider tells you to take them. ?Have your magnesium levels monitored as told by your health care provider. ?Keep all follow-up visits. This is important. ?Contact a health care provider if: ?You get worse instead of better. ?Your symptoms return. ?Get help right away if: ?You develop severe muscle weakness. ?You have trouble breathing. ?You feel that your heart is racing. ?These symptoms may represent a serious problem that is an emergency. Do not wait to see if the symptoms will go away. Get medical help right away. Call your local emergency services (911 in the [place] Do not drive yourself to the hospital. ?Summary ?Hypomagnesemia is a condition in which the level of magnesium in the blood is too low. ?Hypomagnesemia can affect every organ in the body. ?Treatment may include eating more foods that contain magnesium, taking magnesium supplements, and not drinking  alcohol. ?Have your magnesium levels monitored as told by your health care provider. ?This information is not intended to replace advice given to you by your health care  provider. Make sure you discuss any questions you have with your health care provider. ?Document Revised: 08/13/2020 Document Reviewed: 08/13/2020 ?Elsevier Patient Education ? [employer]. ? ?Semaglutide Injection (Weight Management) ?What is this medication? ?SEMAGLUTIDE (SEM a GLOO tide) promotes weight loss. It may also be used to maintain weight loss. It works by decreasing appetite. Changes to diet and exercise are often combined with this medication. ?This medicine may be used for other purposes; ask your health care provider or pharmacist if you have questions. ?COMMON BRAND NAME(S): Wegovy ?What should I tell my care team before I take this medication? ?They need to know if you have any of these conditions: ?Endocrine tumors (MEN 2) or if someone in your family had these tumors ?Eye disease, vision problems ?Gallbladder disease ?History of depression or mental health disease ?History of pancreatitis ?Kidney disease ?Stomach or intestine problems ?Suicidal thoughts, plans, or attempt; a previous suicide attempt by you or a family member ?Thyroid cancer or if someone in your family had thyroid cancer ?An unusual or allergic reaction to semaglutide, other medications, foods, dyes, or preservatives ?Pregnant or trying to get pregnant ?Breast-feeding ?How should I use this medication? ?This medication is injected under the skin. You will be taught how to prepare and give it. Take it as directed on the prescription label. It is given once every week (every 7 days). Keep taking it unless your care team tells you to stop. ?It is important that you put your used needles and pens in a special sharps container. Do not put them in a trash can. If you do not have a sharps container, call your pharmacist or care team to get one. ?A special MedGuide will be given to you by the pharmacist with each prescription and refill. Be sure to read this information carefully each time. ?This medication comes with INSTRUCTIONS  FOR USE. Ask your pharmacist for directions on how to use this medication. Read the information carefully. Talk to your pharmacist or care team if you have questions. ?Talk to your care team about the use of this medication in children. Special care may be needed. ?Overdosage: If you think you have taken too much of this medicine contact a poison control center or emergency room at once. ?NOTE: This medicine is only for you. Do not share this medicine with others. ?What if I miss a dose? ?If you miss a dose and the next scheduled dose is more than 2 days away, take the missed dose as soon as possible. If you miss a dose and the next scheduled dose is less than 2 days away, do not take the missed dose. Take the next dose at your regular time. Do not take double or extra doses. If you miss your dose for 2 weeks or more, take the next dose at your regular time or call your care team to talk about how to restart this medication. ?What may interact with this medication? ?Insulin and other medications for diabetes ?This list may not describe all possible interactions. Give your health care provider a list of all the medicines, herbs, non-prescription drugs, or dietary supplements you use. Also tell them if you smoke, drink alcohol, or use illegal drugs. Some items may interact with your medicine. ?What should I watch for while using this medication? ?Visit your care team for  regular checks on your progress. It may be some time before you see the benefit from this medication. ?Drink plenty of fluids while taking this medication. Check with your care team if you have severe diarrhea, nausea, and vomiting, or if you sweat a lot. The loss of too much body fluid may make it dangerous for you to take this medication. ?This medication may affect blood sugar levels. Ask your care team if changes in diet or medications are needed if you have diabetes. ?If you or your family notice any changes in your behavior, such as new or  worsening depression, thoughts of harming yourself, anxiety, other unusual or disturbing thoughts, or memory loss, call your care team right away. ?Women should inform their care team if they wish to become pre

## 2021-07-06 LAB — URINALYSIS, ROUTINE W REFLEX MICROSCOPIC
Bilirubin Urine: NEGATIVE
Glucose, UA: NEGATIVE
Hyaline Cast: NONE SEEN /LPF
Ketones, ur: NEGATIVE
Nitrite: POSITIVE — AB
RBC / HPF: NONE SEEN /HPF (ref 0–2)
Specific Gravity, Urine: 1.014 (ref 1.001–1.035)
WBC, UA: >=60 /HPF — AB (ref 0–5)
pH: 5.5 (ref 5.0–8.0)

## 2021-07-06 LAB — URINE CULTURE
MICRO NUMBER:: 13231441
SPECIMEN QUALITY:: ADEQUATE

## 2021-07-06 LAB — MICROALBUMIN / CREATININE URINE RATIO
Creatinine, Urine: 98 mg/dL (ref 20–275)
Microalb Creat Ratio: 65 mcg/mg creat — ABNORMAL HIGH (ref <30)
Microalb, Ur: 6.4 mg/dL

## 2021-07-06 LAB — MICROSCOPIC MESSAGE

## 2021-07-07 ENCOUNTER — Other Ambulatory Visit: Payer: Self-pay | Admitting: Internal Medicine

## 2021-07-07 DIAGNOSIS — N3000 Acute cystitis without hematuria: Secondary | ICD-10-CM

## 2021-07-07 MED ORDER — NITROFURANTOIN MONOHYD MACRO 100 MG PO CAPS: 100.0000 mg | ORAL_CAPSULE | Freq: Two times a day (BID) | ORAL | 0 refills | Status: DC

## 2021-07-07 NOTE — Telephone Encounter (Signed)
Inform pt wegovy not covered she must call her insurance and ask what medication they will cover for her for weight loss if anything and let us know  ?Otherwise she can be referred to wt loss clinic in [place] but weight list is 6 months ? ?

## 2021-07-08 DIAGNOSIS — H5213 Myopia, bilateral: Secondary | ICD-10-CM | POA: Diagnosis not present

## 2021-07-08 DIAGNOSIS — H524 Presbyopia: Secondary | ICD-10-CM | POA: Diagnosis not present

## 2021-07-08 DIAGNOSIS — H52209 Unspecified astigmatism, unspecified eye: Secondary | ICD-10-CM | POA: Diagnosis not present

## 2021-07-08 NOTE — Telephone Encounter (Signed)
Pt will call insurance but she also wants referral placed to healthy weight & wellness clinic.  ?

## 2021-07-13 ENCOUNTER — Other Ambulatory Visit: Payer: Self-pay | Admitting: Internal Medicine

## 2021-07-13 DIAGNOSIS — M109 Gout, unspecified: Secondary | ICD-10-CM

## 2021-07-17 ENCOUNTER — Encounter (HOSPITAL_COMMUNITY): Payer: 59 | Admitting: Internal Medicine

## 2021-07-22 DIAGNOSIS — I272 Pulmonary hypertension, unspecified: Secondary | ICD-10-CM | POA: Diagnosis not present

## 2021-07-23 DIAGNOSIS — Z0289 Encounter for other administrative examinations: Secondary | ICD-10-CM

## 2021-07-24 ENCOUNTER — Encounter: Payer: Self-pay | Admitting: Internal Medicine

## 2021-07-24 ENCOUNTER — Ambulatory Visit (INDEPENDENT_AMBULATORY_CARE_PROVIDER_SITE_OTHER): Payer: Medicare HMO | Admitting: Bariatrics

## 2021-07-24 ENCOUNTER — Encounter (INDEPENDENT_AMBULATORY_CARE_PROVIDER_SITE_OTHER): Payer: Self-pay | Admitting: Bariatrics

## 2021-07-24 VITALS — BP 112/70 | HR 83 | Temp 98.1°F | Ht 65.0 in | Wt 241.0 lb

## 2021-07-24 DIAGNOSIS — R7303 Prediabetes: Secondary | ICD-10-CM | POA: Diagnosis not present

## 2021-07-24 DIAGNOSIS — E668 Other obesity: Secondary | ICD-10-CM | POA: Diagnosis not present

## 2021-07-24 DIAGNOSIS — G4733 Obstructive sleep apnea (adult) (pediatric): Secondary | ICD-10-CM

## 2021-07-24 DIAGNOSIS — Z1331 Encounter for screening for depression: Secondary | ICD-10-CM | POA: Diagnosis not present

## 2021-07-24 DIAGNOSIS — Z9989 Dependence on other enabling machines and devices: Secondary | ICD-10-CM

## 2021-07-24 DIAGNOSIS — E559 Vitamin D deficiency, unspecified: Secondary | ICD-10-CM

## 2021-07-24 DIAGNOSIS — R0602 Shortness of breath: Secondary | ICD-10-CM | POA: Diagnosis not present

## 2021-07-24 DIAGNOSIS — E66813 Obesity, class 3: Secondary | ICD-10-CM

## 2021-07-24 DIAGNOSIS — R809 Proteinuria, unspecified: Secondary | ICD-10-CM | POA: Diagnosis not present

## 2021-07-24 DIAGNOSIS — I10 Essential (primary) hypertension: Secondary | ICD-10-CM | POA: Diagnosis not present

## 2021-07-24 DIAGNOSIS — K760 Fatty (change of) liver, not elsewhere classified: Secondary | ICD-10-CM | POA: Diagnosis not present

## 2021-07-24 DIAGNOSIS — Z6841 Body Mass Index (BMI) 40.0 and over, adult: Secondary | ICD-10-CM

## 2021-07-24 DIAGNOSIS — R5383 Other fatigue: Secondary | ICD-10-CM

## 2021-07-24 MED ORDER — VITAMIN D (ERGOCALCIFEROL) 1.25 MG (50000 UNIT) PO CAPS: 50000.0000 [IU] | ORAL_CAPSULE | ORAL | 0 refills | Status: DC

## 2021-07-29 ENCOUNTER — Telehealth (INDEPENDENT_AMBULATORY_CARE_PROVIDER_SITE_OTHER): Payer: Self-pay | Admitting: Bariatrics

## 2021-07-29 NOTE — Telephone Encounter (Signed)
Patient has questions about her meal plan. Please return call to pt [phone]

## 2021-07-29 NOTE — Telephone Encounter (Signed)
[name] ? ?

## 2021-07-30 ENCOUNTER — Telehealth: Payer: Self-pay | Admitting: Internal Medicine

## 2021-07-30 NOTE — Telephone Encounter (Signed)
Pt called in stating that she have insurance issue... Pt stated that her insurance have a different DOB for her... Pt stated that she cant pick up her medication... Pt would like to know if you can change her DOB so she can receive her medication.... Pt is requesting for medication [name]) to be sent back... Pt requesting callback  ?

## 2021-07-30 NOTE — Telephone Encounter (Signed)
Spoke to patient and she stated she would like to know if she can use Butter Bud (equivalent to I can't believe its not butter). Can you please advise?

## 2021-07-30 NOTE — Telephone Encounter (Signed)
S/w pt to call insurance company to see if [name] is covered. If so pt will call back to let us know and we will start PA for wegovy. Pt verbalized understanding.  ?

## 2021-07-30 NOTE — Telephone Encounter (Signed)
Notified patient of Dr. Owens Shark recommendations. Patient verbalized understanding.  ?

## 2021-07-30 NOTE — Telephone Encounter (Signed)
She can call insurance and see if [name] is covered and let us know  ? ?

## 2021-08-01 NOTE — Progress Notes (Signed)
? ? ? ?Chief Complaint:  ? ?OBESITY ?[name] (MR# [id number]) is a 56 y.o. female who presents for evaluation and treatment of obesity and related comorbidities. Current BMI is Body mass index is 40.1 kg/m?Marland Kitchen Clint has been struggling with her weight for many years and has been unsuccessful in either losing weight, maintaining weight loss, or reaching her healthy weight goal. ? ?Dunya states she skips meals. She likes to cook most of the time.  ? ?Neyra is currently in the action stage of change and ready to dedicate time achieving and maintaining a healthier weight. Aastha is interested in becoming our patient and working on intensive lifestyle modifications including (but not limited to) diet and exercise for weight loss. ? ?Syreeta's habits were reviewed today and are as follows: Her family eats meals together, she thinks her family will eat healthier with her, her desired weight loss is 61 pounds, she started gaining weight in 2014, her heaviest weight ever was 252 pounds, she has significant food cravings issues, she snacks frequently in the evenings, she wakes up frequently in the middle of the night to eat, she skips meals frequently, she is frequently drinking liquids with calories, she frequently eats larger portions than normal, and she struggles with emotional eating. ? ?Depression Screen ?Bobbie's Food and Mood (modified PHQ-9) score was 8. ? ? ?  07/24/2021  ?  9:12 AM  ?Depression screen PHQ 2/9  ?Decreased Interest 2  ?Down, Depressed, Hopeless 1  ?PHQ - 2 Score 3  ?Altered sleeping 1  ?Tired, decreased energy 3  ?Change in appetite 0  ?Feeling bad or failure about yourself  0  ?Trouble concentrating 0  ?Moving slowly or fidgety/restless 1  ?Suicidal thoughts 0  ?PHQ-9 Score 8  ?Difficult doing work/chores Very difficult  ? ?Subjective:  ? ?1. Other fatigue ?Ladiamond admits to daytime somnolence and admits to waking up still tired. Patient has a history of symptoms of daytime fatigue, morning  fatigue, and morning headache. Donnesha generally gets 6 hours of sleep per night, and states that she has nightime awakenings and generally restful sleep. Snoring are not present. Apneic episodes is present. Epworth Sleepiness Score is 15.  Mirriam wears Oxygen continuously.  ? ?2. SOB (shortness of breath) on exertion ?Demarie notes increasing shortness of breath with exercising and seems to be worsening over time with weight gain. She notes getting out of breath sooner with activity than she used to. This has not gotten worse recently. Leathie denies shortness of breath at rest or orthopnea.  ? ?3. OSA on CPAP ?Sian is currently using a CPAP. ? ?4. 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 ? ?5. Prediabetes ?Shiza's last A1C was 5.5. ? ?6. Vitamin D deficiency ?Kendrea's last Vitamin D was 30.3. ? ?7. Essential hypertension ?Kamerin is currently taking Spironolactone, Lasix, Metoprolol. She has a history of Congestive Heart Failure and Cardiomegaly..  ? ?8. Microalbuminuria ?Coley will increase her protein.  ? ?Assessment/Plan:  ? ?1. Other fatigue ?Eimy does feel that her weight is causing her energy to be lower than it should be. Fatigue may be related to obesity, depression or many other causes. Labs will be ordered, and in the meanwhile, Valaria will focus on self care including making healthy food choices, increasing physical activity and focusing on stress reduction. Libni will continue using her oxygen. She will follow up with her primary care physician.  ? ?2. SOB (shortness of breath) on exertion ?Lexianna does feel that  she gets out of breath more easily that she used to when she exercises. Earla's shortness of breath appears to be obesity related and exercise induced. She has agreed to work on weight loss and gradually increase exercise to treat her exercise induced shortness of breath. Will continue to monitor closely.  ? ?3. OSA on CPAP ?Intensive lifestyle modifications  are the first line treatment for this issue.Lorrena will continue wearing her CPAP.  We discussed several lifestyle modifications today and she will continue to work on diet, exercise and weight loss efforts. We will continue to monitor. Orders and follow up as documented in patient record.   ? ?4. 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 Haylie will follow up with her primary care physician. Nickayla was educated the importance of weight loss. Conor agreed to continue with her weight loss efforts with healthier diet and exercise as an essential part of her treatment plan.  ? ?5. Prediabetes ?Jolynda will decrease carbohydrates and starches and sweets. She will continue to work on weight loss, exercise, and decreasing simple carbohydrates to help decrease the risk of diabetes.  ? ?6. Vitamin D deficiency ?Low Vitamin D level contributes to fatigue and are associated with obesity, breast, and colon cancer. We will refill prescription Vitamin D 50,000 IU every week for 1 month with no refills and Daffney will follow-up for routine testing of Vitamin D, at least 2-3 times per year to avoid over-replacement. ? ?- Vitamin D, Ergocalciferol, (DRISDOL) 1.25 MG (50000 UNIT) CAPS capsule; Take 1 capsule (50,000 Units total) by mouth every 7 (seven) days.  Dispense: 5 capsule; Refill: 0 ? ?7. Essential hypertension ?Fiora will continue taking Spironolactone, Lasix, and metoprolol. She is working on healthy weight loss and exercise to improve blood pressure control. We will watch for signs of hypotension as she continues her lifestyle modifications. ? ?8. Microalbuminuria ?Stefania will follow up with her primary care physician and cardiologist.  ? ?9. Depression screen ?Genelle had a positive depression screening. Depression is commonly associated with obesity and often results in emotional eating behaviors. We will monitor this closely and  work on CBT to help improve the non-hunger eating patterns. Referral to Psychology may be required if no improvement is seen as she continues in our clinic.  ? ?10. Class 3 severe obesity due to excess calories with serious comorbidity and body mass index (BMI) of 40.0 to 44.9 in adult [facility]) ?Dayona is currently in the action stage of change and her goal is to continue with weight loss efforts. I recommend Armida begin the structured treatment plan as follows: ? ?She has agreed to the Category 3 Plan. ? ?Laquanda will continue meal planning and she will continue with intentional eating. We reviewed labs from 07/03/2021 phosphorus, magnesium , lipids, microalbumin, Vitamin D, A1C, CBC, and lipids.  ? ?Exercise goals: No exercise has been prescribed at this time.  ? ?Behavioral modification strategies: increasing lean protein intake, decreasing simple carbohydrates, increasing vegetables, increasing water intake, decreasing eating out, no skipping meals, meal planning and cooking strategies, keeping healthy foods in the home, and planning for success. ? ?She was informed of the importance of frequent follow-up visits to maximize her success with intensive lifestyle modifications for her multiple health conditions. She was informed we would discuss her lab results at her next visit unless there is a critical issue that needs to be addressed sooner. Makylee agreed to keep her next visit  at the agreed upon time to discuss these results. ? ?Objective:  ? ?Blood pressure 112/70, pulse 83, temperature 98.1 ?F (36.7 ?C), height _0  (1.651 m), weight 241 lb (109.3 kg), last menstrual period 05/11/2013, SpO2 98 %. Body mass index is 40.1 kg/m?. ? ?EKG: Normal sinus rhythm, rate unable to obtain. ? ?Indirect Calorimeter completed today shows a VO2 of 280 and a REE of 1930.  Her calculated basal metabolic rate is 0630 thus her basal metabolic rate is better than expected. ? ?General: Cooperative, alert, well developed, in no acute  distress. ?HEENT: Conjunctivae and lids unremarkable. ?Cardiovascular: Regular rhythm.  ?Lungs: Normal work of breathing. ?Neurologic: No focal deficits.  ?Toya wears Oxygen 2-3 liters at rest, Oxygen

## 2021-08-04 ENCOUNTER — Encounter (INDEPENDENT_AMBULATORY_CARE_PROVIDER_SITE_OTHER): Payer: Self-pay | Admitting: Bariatrics

## 2021-08-05 ENCOUNTER — Ambulatory Visit (INDEPENDENT_AMBULATORY_CARE_PROVIDER_SITE_OTHER): Payer: Medicare HMO | Admitting: Family Medicine

## 2021-08-05 ENCOUNTER — Encounter (INDEPENDENT_AMBULATORY_CARE_PROVIDER_SITE_OTHER): Payer: Self-pay | Admitting: Family Medicine

## 2021-08-05 VITALS — BP 105/64 | HR 98 | Temp 98.6°F | Ht 65.0 in | Wt 235.0 lb

## 2021-08-05 DIAGNOSIS — R7303 Prediabetes: Secondary | ICD-10-CM

## 2021-08-05 DIAGNOSIS — Z6839 Body mass index (BMI) 39.0-39.9, adult: Secondary | ICD-10-CM

## 2021-08-05 DIAGNOSIS — E669 Obesity, unspecified: Secondary | ICD-10-CM

## 2021-08-05 DIAGNOSIS — I10 Essential (primary) hypertension: Secondary | ICD-10-CM | POA: Diagnosis not present

## 2021-08-05 DIAGNOSIS — K219 Gastro-esophageal reflux disease without esophagitis: Secondary | ICD-10-CM

## 2021-08-05 DIAGNOSIS — K760 Fatty (change of) liver, not elsewhere classified: Secondary | ICD-10-CM | POA: Diagnosis not present

## 2021-08-05 DIAGNOSIS — E559 Vitamin D deficiency, unspecified: Secondary | ICD-10-CM

## 2021-08-05 MED ORDER — VITAMIN D (ERGOCALCIFEROL) 1.25 MG (50000 UNIT) PO CAPS: 50000.0000 [IU] | ORAL_CAPSULE | ORAL | 0 refills | Status: DC

## 2021-08-18 NOTE — Progress Notes (Unsigned)
TeleHealth Visit:  This visit was completed with telemedicine (audio/video) technology. [name] has verbally consented to this TeleHealth visit. The patient is located at home, the provider is located at home. The participants in this visit include the listed provider and patient. The visit was conducted today via MyChart video.  OBESITY [name] is here to discuss her progress with her obesity treatment plan along with follow-up of her obesity related diagnoses.   Today's visit was # 3 Starting weight: 241 lbs Starting date: 07/24/21 Weight at last in office visit: 235 lbs on 08/05/21 Total weight loss: 6 lbs at last in office visit on 08/05/21. Today's reported weight: 237 lbs   Nutrition Plan: the Category 3 Plan.  Hunger is well controlled. Cravings are well controlled.  Current exercise: class at gym 2-3 days per week and then goes to [employer] on non-class days (weights/treadmill)  Interim History: She struggles to eat all of the food on the plan.  She sometimes skips meals.  She generally has a protein shake for breakfast. She does a great job with exercise which is remarkable since she is on continuous oxygen at about 4 L.   Assessment/Plan:  1. Type II Diabetes with microalbuminuria HgbA1c is at goal.  A1c has been stable for a few years.  She had an A1c of 8.8 in November of 2017.  It was 6.4 in September 2020.  Has been around 5.5 since December 2021. Medication(s): none  Lab Results  Component Value Date   HGBA1C 5.5 07/03/2021   HGBA1C 5.5 10/25/2020   HGBA1C 5.5 03/05/2020   Lab Results  Component Value Date   MICROALBUR 6.4 07/03/2021   LDLCALC 97 07/03/2021   CREATININE 0.89 11/21/2020    Plan: Continue to work on weight loss, exercise, and reducing carbs in diet.  2. Vitamin D Deficiency Vitamin D is not at goal of 50. She is on weekly prescription Vitamin D 50,000 IU.  Lab Results  Component Value Date   VD25OH 30.38 07/03/2021   VD25OH 60.62  10/25/2020   VD25OH 10.6 (L) 03/05/2020    Plan: Refill prescription vitamin D 50,000 IU weekly.  3. Obesity: Current BMI 39.1 [name] is currently in the action stage of change. As such, her goal is to continue with weight loss efforts.  She has agreed to the Category 3 Plan.   Exercise goals: Continue current regimen.  Behavioral modification strategies: increasing lean protein intake and decreasing simple carbohydrates.  [name] has agreed to follow-up with our clinic in 2 weeks.   No orders of the defined types were placed in this encounter.   There are no discontinued medications.   No orders of the defined types were placed in this encounter.     Objective:   VITALS: Per patient if applicable, see vitals. GENERAL: Alert and in no acute distress. CARDIOPULMONARY: No increased WOB. Speaking in clear sentences.  PSYCH: Pleasant and cooperative. Speech normal rate and rhythm. Affect is appropriate. Insight and judgement are appropriate. Attention is focused, linear, and appropriate.  NEURO: Oriented as arrived to appointment on time with no prompting.   Lab Results  Component Value Date   CREATININE 0.89 11/21/2020   BUN 15 11/21/2020   NA 140 11/21/2020   K 3.7 11/21/2020   CL 104 11/21/2020   CO2 25 11/21/2020   Lab Results  Component Value Date   ALT 11 11/21/2020   AST 12 (L) 11/21/2020   ALKPHOS 97 11/21/2020   BILITOT 0.7 11/21/2020  Lab Results  Component Value Date   HGBA1C 5.5 07/03/2021   HGBA1C 5.5 10/25/2020   HGBA1C 5.5 03/05/2020   HGBA1C 5.0 10/05/2019   HGBA1C 6.4 12/22/2018   No results found for: INSULIN Lab Results  Component Value Date   TSH 0.82 07/03/2021   Lab Results  Component Value Date   CHOL 165 07/03/2021   HDL 44.50 07/03/2021   LDLCALC 97 07/03/2021   TRIG 118.0 07/03/2021   CHOLHDL 4 07/03/2021   Lab Results  Component Value Date   WBC 6.2 07/03/2021   HGB 11.5 (L) 07/03/2021   HCT 34.2 (L) 07/03/2021   MCV  94.4 07/03/2021   PLT 124.0 (L) 07/03/2021   Lab Results  Component Value Date   IRON 81 12/23/2020   TIBC 366 12/23/2020   FERRITIN 160 12/23/2020   Lab Results  Component Value Date   VD25OH 30.38 07/03/2021   VD25OH 60.62 10/25/2020   VD25OH 10.6 (L) 03/05/2020    Attestation Statements:   Reviewed by clinician on day of visit: allergies, medications, problem list, medical history, surgical history, family history, social history, and previous encounter notes.

## 2021-08-19 ENCOUNTER — Encounter (INDEPENDENT_AMBULATORY_CARE_PROVIDER_SITE_OTHER): Payer: Self-pay | Admitting: Family Medicine

## 2021-08-19 ENCOUNTER — Telehealth (INDEPENDENT_AMBULATORY_CARE_PROVIDER_SITE_OTHER): Payer: Medicare HMO | Admitting: Family Medicine

## 2021-08-19 DIAGNOSIS — E1129 Type 2 diabetes mellitus with other diabetic kidney complication: Secondary | ICD-10-CM | POA: Diagnosis not present

## 2021-08-19 DIAGNOSIS — R809 Proteinuria, unspecified: Secondary | ICD-10-CM | POA: Diagnosis not present

## 2021-08-19 DIAGNOSIS — E559 Vitamin D deficiency, unspecified: Secondary | ICD-10-CM

## 2021-08-19 DIAGNOSIS — Z6839 Body mass index (BMI) 39.0-39.9, adult: Secondary | ICD-10-CM

## 2021-08-19 DIAGNOSIS — E669 Obesity, unspecified: Secondary | ICD-10-CM

## 2021-08-19 MED ORDER — VITAMIN D (ERGOCALCIFEROL) 1.25 MG (50000 UNIT) PO CAPS: 50000.0000 [IU] | ORAL_CAPSULE | ORAL | 0 refills | Status: DC

## 2021-08-21 NOTE — Progress Notes (Signed)
Chief Complaint:   OBESITY [name] is here to discuss her progress with her obesity treatment plan along with follow-up of her obesity related diagnoses. Tonya is on the Category 3 Plan and states she is following her eating plan approximately 90% of the time. Dian states she is going to the gym and doing exercise class 60 minutes 5 times per week.  Today's visit was #: 2 Starting weight: 241 lbs Starting date: 07/24/2021 Today's weight: 235 lbs Today's date: 08/05/2021 Total lbs lost to date: 6 lbs Total lbs lost since last in-office visit: 6  Interim History: [name] is here today for her first follow-up office visit since starting the program with [place].  All blood work/ lab tests that were recently ordered by myself or an outside provider were reviewed with patient today per their request.   Extended time was spent counseling her on all new disease processes that were discovered or preexisting ones that are affected by BMI.  she understands that many of these abnormalities will need to monitored regularly along with the current treatment plan of prudent dietary changes, in which we are making each and every office visit, to improve these health parameters. Jaeleen feels like it is "too much food" at dinner. She had to eat half at one time and the other half at another time. Pt denies hunger or cravings, and otherwise no issues with meal plan.  Subjective:   1. NAFLD (nonalcoholic fatty liver disease) Discussed labs with patient today. Most recent labs show AST is 9 and ALT is 8.  2. Pre-diabetes Discussed labs with patient today. Pt's A1c was 6.4 on 12/21/2020 and A1c recently at 5.5. Pt's PCP prescribed Wegovy, but pt does not have coverage for it.   3. Vitamin D deficiency Worsening. Discussed labs with patient today. Vit D level is 30.38 and was previously 60.62 when she was on weekly Ergocalciferol approximately 9 months ago. Level sub-optimally controlled but not at  goal.  4. Essential hypertension Discussed labs with patient today. Pt reports experiencing occasional dizziness, which she had prior (no new sxs). Her BP at home is similar to what it is in clinic today. Pt denies chest pain, heart palpitations, vision changes, or syncope. Medication: Demadex, spironolactone, metoprolol   5. Gastroesophageal reflux disease, unspecified whether esophagitis present Discussed labs with patient today. Pt reports sxs are no worse with new meal plan and are possibly even  better. She denies concerns. Medication: omeprazole  Assessment/Plan:  No orders of the defined types were placed in this encounter.   Medications Discontinued During This Encounter  Medication Reason   Vitamin D, Ergocalciferol, (DRISDOL) 1.25 MG (50000 UNIT) CAPS capsule Reorder     Meds ordered this encounter  Medications   DISCONTD: Vitamin D, Ergocalciferol, (DRISDOL) 1.25 MG (50000 UNIT) CAPS capsule    Sig: Take 1 capsule (50,000 Units total) by mouth every 7 (seven) days.    Dispense:  5 capsule    Refill:  0    30 d supply;  ** OV for RF **   Do not send RF request     1. NAFLD (nonalcoholic fatty liver disease) Plan: - pathophysiology of disease discussed with [name]. Elevated liver transaminases with an ALT predominance combined with obesity and insulin resistance is characteristic, but not diagnostic of non-alcoholic fatty liver disease (NAFLD). NAFLD is the 2nd leading cause of liver transplant in adults. Treatment includes weight loss, elimination of sweet drinks, including juice, avoidance of high fructose  corn syrup, and exercise. As always, avoiding alcohol consumption is important. - Treatment goal: 7-10% reduction of body weight.   - Aerobic activity and resistance training are important to help achieve a healthy body weight BUT also increases peripheral insulin sensitivity, reducing delivery of free fatty acids and glucose to the liver.  Also recommended  exercise to increase intrahepatic fatty acid oxidation, decrease fatty acid synthesis, and help prevent mitochondrial and hepatocellular damage. - Medications that can help reduce hepatic fat include metformin and GLP-1's, which were discussed with pt and prescribed if pt agreed to treatment.   2. Pre-diabetes Darlynn will continue to work on weight loss, exercise, decreasing simple carbohydrates, and increase protein by following prudent nutritional plan to help decrease the risk of diabetes. Handout on pre-diabetes given to pt with disease discussion. All pt questions were answered.   3. Vitamin D deficiency - I discussed the importance of vitamin D to the patient's health and well-being.  - I reviewed possible symptoms of low Vitamin D:  low energy, depressed mood, muscle aches, joint aches, osteoporosis etc. was reviewed with patient - low Vitamin D levels may be linked to an increased risk of cardiovascular events and even increased risk of cancers- such as colon and breast.  - ideal vitamin D levels reviewed with patient  - I recommend pt take a 50,000 weekly prescription vit D - see script below   - Informed patient this may be a lifelong thing, and she was encouraged to continue to take the medicine until told otherwise.    - weight loss will likely improve availability of vitamin D, thus encouraged Kiwana to continue with meal plan and their weight loss efforts to further improve this condition.  Thus, we will need to monitor levels regularly (every 3-4 mo on average) to keep levels within normal limits and prevent over supplementation. - pt's questions and concerns regarding this condition addressed.  4. Essential hypertension Justis is working on healthy weight loss and exercise to improve blood pressure control. We will watch for signs of hypotension as she continues her lifestyle modifications.  Check BP every 2-3 days. If BP continues to run low or dizziness continues or increases,  call PCP regarding BP medication management and evaluation of symptoms.  5. Gastroesophageal reflux disease, unspecified whether esophagitis present Continue omeprazole, avoid trigger foods, and no eating within 2-3 hours of laying down.  Intensive lifestyle modifications are the first line treatment for this issue. We discussed several lifestyle modifications today and she will continue to work on diet, exercise and weight loss efforts. Orders and follow up as documented in patient record.   Counseling If a person has gastroesophageal reflux disease (GERD), food and stomach acid move back up into the esophagus and cause symptoms or problems such as damage to the esophagus. Anti-reflux measures include: raising the head of the bed, avoiding tight clothing or belts, avoiding eating late at night, not lying down shortly after mealtime, and achieving weight loss. Avoid ASA, NSAID's, caffeine, alcohol, and tobacco.  OTC Pepcid and/or Tums are often very helpful for as needed use.  However, for persisting chronic or daily symptoms, stronger medications like Omeprazole may be needed. You may need to avoid foods and drinks such as: Coffee and tea (with or without caffeine). Drinks that contain alcohol. Energy drinks and sports drinks. Bubbly (carbonated) drinks or sodas. Chocolate and cocoa. Peppermint and mint flavorings. Garlic and onions. Horseradish. Spicy and acidic foods. These include peppers, chili powder, curry powder, vinegar,  hot sauces, and BBQ sauce. Citrus fruit juices and citrus fruits, such as oranges, lemons, and limes. Tomato-based foods. These include red sauce, chili, salsa, and pizza with red sauce. Fried and fatty foods. These include donuts, french fries, potato chips, and high-fat dressings. High-fat meats. These include hot dogs, rib eye steak, sausage, ham, and bacon.  6. Obesity, Current BMI 39.2  Lianette is currently in the action stage of change. As such, her goal is  to continue with weight loss efforts. She has agreed to the Category 3 Plan.   We discussed pt moving a couple of ounces from dinner to lunch or breakfast and other strategies to eat all her nutrients in a day.  Exercise goals:  As is.  Behavioral modification strategies: increasing lean protein intake, no skipping meals, and planning for success.  Emmett has agreed to follow-up with our clinic in 2-3 weeks. She was informed of the importance of frequent follow-up visits to maximize her success with intensive lifestyle modifications for her multiple health conditions.   Objective:   Blood pressure 105/64, pulse 98, temperature 98.6 F (37 C), height _0  (1.651 m), weight 235 lb (106.6 kg), last menstrual period 05/11/2013, SpO2 94 %. Body mass index is 39.11 kg/m.  General: Cooperative, alert, well developed, in no acute distress. HEENT: Conjunctivae and lids unremarkable. Cardiovascular: Regular rhythm.  Lungs: Normal work of breathing. Neurologic: No focal deficits.   Lab Results  Component Value Date   CREATININE 0.89 11/21/2020   BUN 15 11/21/2020   NA 140 11/21/2020   K 3.7 11/21/2020   CL 104 11/21/2020   CO2 25 11/21/2020   Lab Results  Component Value Date   ALT 11 11/21/2020   AST 12 (L) 11/21/2020   ALKPHOS 97 11/21/2020   BILITOT 0.7 11/21/2020   Lab Results  Component Value Date   HGBA1C 5.5 07/03/2021   HGBA1C 5.5 10/25/2020   HGBA1C 5.5 03/05/2020   HGBA1C 5.0 10/05/2019   HGBA1C 6.4 12/22/2018   No results found for: INSULIN Lab Results  Component Value Date   TSH 0.82 07/03/2021   Lab Results  Component Value Date   CHOL 165 07/03/2021   HDL 44.50 07/03/2021   LDLCALC 97 07/03/2021   TRIG 118.0 07/03/2021   CHOLHDL 4 07/03/2021   Lab Results  Component Value Date   VD25OH 30.38 07/03/2021   VD25OH 60.62 10/25/2020   VD25OH 10.6 (L) 03/05/2020   Lab Results  Component Value Date   WBC 6.2 07/03/2021   HGB 11.5 (L) 07/03/2021   HCT  34.2 (L) 07/03/2021   MCV 94.4 07/03/2021   PLT 124.0 (L) 07/03/2021   Lab Results  Component Value Date   IRON 81 12/23/2020   TIBC 366 12/23/2020   FERRITIN 160 12/23/2020    Obesity Behavioral Intervention:   Approximately 15 minutes were spent on the discussion below.  ASK: We discussed the diagnosis of obesity with Jodilyn today and Gae agreed to give [place] permission to discuss obesity behavioral modification therapy today.  ASSESS: Korra has the diagnosis of obesity and her BMI today is 39.2. Leslea is in the action stage of change.   ADVISE: Tanith was educated on the multiple health risks of obesity as well as the benefit of weight loss to improve her health. She was advised of the need for long term treatment and the importance of lifestyle modifications to improve her current health and to decrease her risk of future health problems.  AGREE: Multiple dietary  modification options and treatment options were discussed and Shannen agreed to follow the recommendations documented in the above note.  ARRANGE: Azana was educated on the importance of frequent visits to treat obesity as outlined per CMS and USPSTF guidelines and agreed to schedule her next follow up appointment today.  Attestation Statements:   Reviewed by clinician on day of visit: allergies, medications, problem list, medical history, surgical history, family history, social history, and previous encounter notes.  I, Kathlene November, BS, [name], am acting as transcriptionist for [employer], DO.  I have reviewed the above documentation for accuracy and completeness, and I agree with the above. Marjory Sneddon, D.O.  The [place] was signed into law in 2016 which includes the topic of electronic health records.  This provides immediate access to information in MyChart.  This includes consultation notes, operative notes, office notes, lab results and pathology reports.  If you have any  questions about what you read please let us know at your next visit so we can discuss your concerns and take corrective action if need be.  We are right here with you.

## 2021-09-08 ENCOUNTER — Ambulatory Visit (INDEPENDENT_AMBULATORY_CARE_PROVIDER_SITE_OTHER): Payer: Medicare HMO | Admitting: Bariatrics

## 2021-09-08 ENCOUNTER — Encounter (INDEPENDENT_AMBULATORY_CARE_PROVIDER_SITE_OTHER): Payer: Self-pay | Admitting: Bariatrics

## 2021-09-08 VITALS — BP 107/67 | HR 94 | Temp 97.9°F | Ht 65.0 in | Wt 239.0 lb

## 2021-09-08 DIAGNOSIS — G4733 Obstructive sleep apnea (adult) (pediatric): Secondary | ICD-10-CM | POA: Diagnosis not present

## 2021-09-08 DIAGNOSIS — Z9989 Dependence on other enabling machines and devices: Secondary | ICD-10-CM

## 2021-09-08 DIAGNOSIS — K219 Gastro-esophageal reflux disease without esophagitis: Secondary | ICD-10-CM

## 2021-09-08 DIAGNOSIS — E669 Obesity, unspecified: Secondary | ICD-10-CM

## 2021-09-08 DIAGNOSIS — N182 Chronic kidney disease, stage 2 (mild): Secondary | ICD-10-CM

## 2021-09-08 DIAGNOSIS — Z6839 Body mass index (BMI) 39.0-39.9, adult: Secondary | ICD-10-CM | POA: Diagnosis not present

## 2021-09-09 NOTE — Progress Notes (Unsigned)
Chief Complaint:   OBESITY [name] is here to discuss her progress with her obesity treatment plan along with follow-up of her obesity related diagnoses. [name] is on keeping a food journal and adhering to recommended goals of 1200 calories and 45-60 grams of protein and states she is following her eating plan approximately 100% of the time. [name] states she is doing exercise class for 60 minutes 3 times per week and going to the gym for 45-60 minutes 3 times per week.  Today's visit was #: 4 Starting weight: 241 lbs Starting date: 07/24/2021 Today's weight: 239 lbs Today's date: 09/08/2021 Total lbs lost to date: 2 lbs Total lbs lost since last in-office visit: 0  Interim History: [name] is up 4 lbs since her last visit. She is up 3 lbs of water per the bioimpedance scale. She thinks that she is retaining water.   Subjective:   1. OSA on CPAP [name] is currently using her CPAP at night.   2. Gastroesophageal reflux disease, unspecified whether esophagitis present [name] is taking Prilosec. She occasionally notes some vomiting.   3. Stage 2 chronic kidney disease [name] is currently taking Aladactone and Lasix. She uses oxygen (4 pulse) or 2/Lmin if continuously.  Assessment/Plan:   1. OSA on CPAP Lorian will continue using her CPAP at night. Intensive lifestyle modifications are the first line treatment for this issue. We discussed several lifestyle modifications today and she will continue to work on diet, exercise and weight loss efforts. We will continue to monitor. Orders and follow up as documented in patient record.    2. Gastroesophageal reflux disease, unspecified whether esophagitis present Intensive lifestyle modifications are the first line treatment for this issue. [name] will continue taking Prilosec. We discussed several lifestyle modifications today and she will continue to work on diet, exercise and weight loss efforts. Orders and follow up as documented in  patient record.   Counseling If a person has gastroesophageal reflux disease (GERD), food and stomach acid move back up into the esophagus and cause symptoms or problems such as damage to the esophagus. Anti-reflux measures include: raising the head of the bed, avoiding tight clothing or belts, avoiding eating late at night, not lying down shortly after mealtime, and achieving weight loss. Avoid ASA, NSAID's, caffeine, alcohol, and tobacco.  OTC Pepcid and/or Tums are often very helpful for as needed use.  However, for persisting chronic or daily symptoms, stronger medications like Omeprazole may be needed. You may need to avoid foods and drinks such as: Coffee and tea (with or without caffeine). Drinks that contain alcohol. Energy drinks and sports drinks. Bubbly (carbonated) drinks or sodas. Chocolate and cocoa. Peppermint and mint flavorings. Garlic and onions. Horseradish. Spicy and acidic foods. These include peppers, chili powder, curry powder, vinegar, hot sauces, and BBQ sauce. Citrus fruit juices and citrus fruits, such as oranges, lemons, and limes. Tomato-based foods. These include red sauce, chili, salsa, and pizza with red sauce. Fried and fatty foods. These include donuts, french fries, potato chips, and high-fat dressings. High-fat meats. These include hot dogs, rib eye steak, sausage, ham, and bacon.   3. Stage 2 chronic kidney disease Lab results and trends reviewed. Misaki will continue with her cardiologist twice a year. She will weigh at home. She will have no added salt. We discussed several lifestyle modifications today and she will continue to work on diet, exercise and weight loss efforts. Avoid nephrotoxic medications. Orders and follow up as documented in patient record.  Counseling Chronic kidney disease (CKD) happens when the kidneys are damaged over a long period of time. Most of the time, this condition does not go away, but it can usually be controlled.  Steps must be taken to slow down the kidney damage or to stop it from getting worse. Intensive lifestyle modifications are the first line treatment for this issue.  Avoid buying foods that are: processed, frozen, or prepackaged to avoid excess salt.    4. Obesity, Current BMI 39.8 [name] is currently in the action stage of change. As such, her goal is to continue with weight loss efforts. She has agreed to the Category 2 Plan, Pescatarian, and keeping a food journal and adhering to recommended goals of 1200 calories and 80 grams of protein.  Category 1 and 2 with breakfast options.   [name] will continue meal planning. She will continue to adhere to the plan closely. She will increase her water intake. Handout for breakfast options and Protein equivalent was provided today.   Exercise goals:  As is   Behavioral modification strategies: increasing lean protein intake, decreasing simple carbohydrates, increasing vegetables, increasing water intake, decreasing eating out, no skipping meals, meal planning and cooking strategies, keeping healthy foods in the home, and planning for success.  [name] has agreed to follow-up with our clinic in 2-3 weeks. She was informed of the importance of frequent follow-up visits to maximize her success with intensive lifestyle modifications for her multiple health conditions.   Objective:   Blood pressure 107/67, pulse 94, temperature 97.9 F (36.6 C), height _0  (1.651 m), weight 239 lb (108.4 kg), last menstrual period 05/11/2013, SpO2 94 %. Body mass index is 39.77 kg/m.  General: Cooperative, alert, well developed, in no acute distress. HEENT: Conjunctivae and lids unremarkable. Cardiovascular: Regular rhythm.  Lungs: Normal work of breathing. Neurologic: No focal deficits.   Lab Results  Component Value Date   CREATININE 0.89 11/21/2020   BUN 15 11/21/2020   NA 140 11/21/2020   K 3.7 11/21/2020   CL 104 11/21/2020   CO2 25 11/21/2020   Lab  Results  Component Value Date   ALT 11 11/21/2020   AST 12 (L) 11/21/2020   ALKPHOS 97 11/21/2020   BILITOT 0.7 11/21/2020   Lab Results  Component Value Date   HGBA1C 5.5 07/03/2021   HGBA1C 5.5 10/25/2020   HGBA1C 5.5 03/05/2020   HGBA1C 5.0 10/05/2019   HGBA1C 6.4 12/22/2018   No results found for: "INSULIN" Lab Results  Component Value Date   TSH 0.82 07/03/2021   Lab Results  Component Value Date   CHOL 165 07/03/2021   HDL 44.50 07/03/2021   LDLCALC 97 07/03/2021   TRIG 118.0 07/03/2021   CHOLHDL 4 07/03/2021   Lab Results  Component Value Date   VD25OH 30.38 07/03/2021   VD25OH 60.62 10/25/2020   VD25OH 10.6 (L) 03/05/2020   Lab Results  Component Value Date   WBC 6.2 07/03/2021   HGB 11.5 (L) 07/03/2021   HCT 34.2 (L) 07/03/2021   MCV 94.4 07/03/2021   PLT 124.0 (L) 07/03/2021   Lab Results  Component Value Date   IRON 81 12/23/2020   TIBC 366 12/23/2020   FERRITIN 160 12/23/2020   Attestation Statements:   Reviewed by clinician on day of visit: allergies, medications, problem list, medical history, surgical history, family history, social history, and previous encounter notes.  I, Lizbeth Bark, RMA, am acting as Location manager for [employer], DO.  I have reviewed the above  documentation for accuracy and completeness, and I agree with the above. Jearld Lesch, DO

## 2021-09-15 ENCOUNTER — Other Ambulatory Visit: Payer: Self-pay | Admitting: Hematology and Oncology

## 2021-10-02 ENCOUNTER — Ambulatory Visit: Payer: Self-pay | Admitting: Internal Medicine

## 2021-10-06 ENCOUNTER — Ambulatory Visit (INDEPENDENT_AMBULATORY_CARE_PROVIDER_SITE_OTHER): Payer: Medicare HMO | Admitting: Bariatrics

## 2021-10-07 ENCOUNTER — Ambulatory Visit: Payer: Self-pay | Admitting: Internal Medicine

## 2021-10-08 ENCOUNTER — Telehealth: Payer: Self-pay | Admitting: Hematology and Oncology

## 2021-10-08 NOTE — Telephone Encounter (Signed)
Rescheduled appointment per provider covering [facility]. Patient is aware of the changes made to her upcoming appointment.

## 2021-10-14 ENCOUNTER — Encounter: Payer: Self-pay | Admitting: Internal Medicine

## 2021-10-14 ENCOUNTER — Ambulatory Visit (HOSPITAL_COMMUNITY)
Admission: RE | Admit: 2021-10-14 | Discharge: 2021-10-14 | Disposition: A | Discharge status: Home / Self Care | Payer: Medicare Other | Source: Ambulatory Visit | Attending: Internal Medicine | Admitting: Internal Medicine

## 2021-10-14 ENCOUNTER — Other Ambulatory Visit (HOSPITAL_COMMUNITY): Payer: Self-pay | Admitting: Internal Medicine

## 2021-10-14 ENCOUNTER — Encounter (HOSPITAL_COMMUNITY): Payer: Self-pay | Admitting: Internal Medicine

## 2021-10-14 ENCOUNTER — Ambulatory Visit (HOSPITAL_COMMUNITY)
Admission: RE | Admit: 2021-10-14 | Discharge: 2021-10-14 | Disposition: A | Discharge status: Home / Self Care | Payer: Self-pay | Source: Ambulatory Visit | Attending: Internal Medicine | Admitting: Internal Medicine

## 2021-10-14 VITALS — BP 144/88 | HR 105 | Wt 241.2 lb

## 2021-10-14 DIAGNOSIS — E669 Obesity, unspecified: Secondary | ICD-10-CM | POA: Diagnosis not present

## 2021-10-14 DIAGNOSIS — R002 Palpitations: Secondary | ICD-10-CM | POA: Insufficient documentation

## 2021-10-14 DIAGNOSIS — Z9989 Dependence on other enabling machines and devices: Secondary | ICD-10-CM

## 2021-10-14 DIAGNOSIS — G4733 Obstructive sleep apnea (adult) (pediatric): Secondary | ICD-10-CM | POA: Diagnosis not present

## 2021-10-14 DIAGNOSIS — Z86711 Personal history of pulmonary embolism: Secondary | ICD-10-CM | POA: Diagnosis present

## 2021-10-14 DIAGNOSIS — I5032 Chronic diastolic (congestive) heart failure: Secondary | ICD-10-CM

## 2021-10-14 DIAGNOSIS — I2721 Secondary pulmonary arterial hypertension: Secondary | ICD-10-CM | POA: Diagnosis not present

## 2021-10-14 DIAGNOSIS — Z6841 Body Mass Index (BMI) 40.0 and over, adult: Secondary | ICD-10-CM | POA: Diagnosis not present

## 2021-10-14 DIAGNOSIS — R000 Tachycardia, unspecified: Secondary | ICD-10-CM | POA: Diagnosis not present

## 2021-10-14 NOTE — Progress Notes (Signed)
Zio patch placed onto patient.  All instructions and information reviewed with patient, they verbalize understanding with no questions.

## 2021-10-14 NOTE — Progress Notes (Signed)
ADVANCED HF CLINIC NOTE   Primary Care: McLean-Scocuzza, Nino Glow, MD Primary Cardiologist: Dr. Harrell Gave HF Cardioliogist: Dr. Haroldine Laws  HPI: [name] is a 56 y.o.female with morbid obesity, pulmonary hypertension due iPAH (followed by Dr. Harrietta Guardian at [facility]), RV failure, OSA/OHS on CPAP, breast CA, previous PE.   I saw her in 2020 as an initial consult and as her PAH was being closely managed at [facility], I had her f/u PRN. She was een by Dr. Harrell Gave on 12/09/20 and c/o 20 pound weight gain with worsening SOB so referred back here for further evaluation.  She continues to follow closely with Dr. Harrietta Guardian. In 2017, had pulmonary angiogram that ruled out CTEPH. In 10/20 she was started on remodulin.  Now on remodulin pump, sildenafil 40 tid and macintentan 10. Has persistent NYHA III symptoms but has been resistant to uptitration of remodulin, Has RHC at [facility] scheduled for tomorrow  Echo 8/22 LVEF 60-65% RV moderately dilated and HK. TR jet inadequate to estimate RVSP.   Last seen 9/22, at that time NYHA III but symptoms felt out of proportion to exam. Arranged to [place] to further assess functional decline, but looks like patient no-showed.  She had RHC at [facility] 12/22 showing improved pressures (see below).   RA mean:  5  RV : 60/2/7  PA: 58/25 (37)         PCW:  9  PCWP 12  PA sat: 66%                  Arterial sat: 99%  CO/CI: 6.25 L/min /2.93 (Fick)  PVR: 4.48 Wood Units/ 358 dyn-sec/cm5 (Fick)  PVR: 3.34 Wood Units/ 267 dyn-sec/cm5 (Thermodilution)   Today she returns for [facility] follow up. Overall feeling ok. Has good days/bad days. Dyspne with moderate activity. No syncope or presyncope. + occasional palpitations. Mild swelling later in day.    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   Menorrhagia    secondary to uterine fibroids   OSA (obstructive sleep apnea)    07/25/13 HST AHI 33/hr, severe hypoxemia O2 min 42% and 95% of the time <89%   PE (pulmonary embolism) 2014   bilateral   Prediabetes    Pulmonary artery hypertension (HCC)    Pulmonary nodule    (71m on loeft lower lobe) found on CT scan July 2014, repeat scan Jan 2015 showed less than 417m  Right ovarian cyst    noted 09/2012    S/P TAH (total abdominal hysterectomy) 06/07/2013   Sleep apnea    SOB (shortness of breath) on exertion    Stomach ulcer    Trichomoniasis    05/2011    Vitamin D deficiency     Current Outpatient Medications  Medication Sig Dispense Refill   acetaminophen (TYLENOL) 500 MG tablet Take 1,000 mg by mouth every 6 (six) hours as needed for moderate pain or headache.      albuterol (ACCUNEB) 1.25  MG/3ML nebulizer solution Take 3 mLs (1.25 mg total) by nebulization every 6 (six) hours as needed for wheezing. 360 mL 12   albuterol (VENTOLIN HFA) 108 (90 Base) MCG/ACT inhaler Inhale 1-2 puffs into the lungs every 6 (six) hours as needed for wheezing or shortness of breath. 18 g 11   allopurinol (ZYLOPRIM) 300 MG tablet TAKE 1 TABLET BY MOUTH EVERY DAY NEED APPT 90 tablet 1   anastrozole (ARIMIDEX) 1 MG tablet TAKE 1 TABLET BY MOUTH EVERY DAY 90 tablet 3   Azelastine HCl 137 MCG/SPRAY SOLN PLACE 1 SPRAY INTO BOTH NOSTRILS 2 (TWO) TIMES DAILY. USE IN EACH NOSTRIL AS DIRECTED 30 mL 4   clotrimazole-betamethasone (LOTRISONE) cream Apply 1 application topically 2 (two) times daily. Prn leg dermatitis 45 g 0   Colchicine (MITIGARE) 0.6 MG CAPS Take 1 capsule by mouth as needed. As needed gout flare max # of pills 2 in 24 hours 30 capsule 11   fluticasone  (FLONASE) 50 MCG/ACT nasal spray Place into both nostrils daily as needed for allergies or rhinitis.     Fluticasone-Salmeterol (ADVAIR HFA IN) Inhale 1 puff into the lungs as needed.     ibuprofen (ADVIL) 800 MG tablet Take 1 tablet (800 mg total) by mouth every 8 (eight) hours as needed. 90 tablet 2   loratadine (CLARITIN) 10 MG tablet Take 10 mg by mouth daily.     Magnesium Cl-Calcium Carbonate (SLOW MAGNESIUM/CALCIUM) 70-117 MG TBEC Take 2 tablets by mouth in the morning and at bedtime.     metoprolol tartrate (LOPRESSOR) 25 MG tablet TAKE 1 TABLET BY MOUTH TWICE A DAY 180 tablet 3   Multiple Vitamin (MULTIVITAMIN) tablet Take 1 tablet by mouth daily.     omeprazole (PRILOSEC) 40 MG capsule Take 40 mg by mouth as needed.     OPSUMIT 10 MG TABS Take 10 mg by mouth daily.  8   Polyethyl Glycol-Propyl Glycol (SYSTANE OP) Place 1 drop into both eyes 3 (three) times daily as needed (dry/irritated eyes.).     potassium chloride SA (KLOR-CON) 20 MEQ tablet Take 1 tablet (20 mEq total) by mouth daily.     rivaroxaban (XARELTO) 20 MG TABS tablet Take 20 mg by mouth daily with supper.     sildenafil (REVATIO) 20 MG tablet Take 1-2 tablets (20-40 mg total) by mouth 3 (three) times daily. As of 07/03/20 taking 20 mg tid per [facility] Dr. Harrietta Guardian 10 tablet 0   spironolactone (ALDACTONE) 50 MG tablet Take 1 tablet (50 mg total) by mouth daily. 90 tablet 3   torsemide (DEMADEX) 100 MG tablet Take 100 mg by mouth daily.      treprostinil (REMODULIN) 20 MG/20ML injection 20 ng/kg/min by Continuous infusion (non-IV) route continuous.     triamcinolone cream (KENALOG) 0.1 % Apply 1 application topically daily as needed (leg discoloration). 454 g 2   Vitamin D, Ergocalciferol, (DRISDOL) 1.25 MG (50000 UNIT) CAPS capsule Take 1 capsule (50,000 Units total) by mouth every 7 (seven) days. (Patient not taking: Reported on 10/14/2021) 5 capsule 0   No current facility-administered medications for this encounter.    Facility-Administered Medications Ordered in Other Encounters  Medication Dose Route Frequency Provider Last Rate Last Admin   heparin lock flush 100 unit/mL  500 Units Intracatheter Once PRN Nicholas Lose, MD       sodium chloride flush (NS) 0.9 % injection 10 mL  10 mL Intracatheter PRN Nicholas Lose, MD        Allergies  Allergen Reactions   Ampicillin Other (See Comments)    Severe abdominal pain, dizziness (penicillin is okay)   Amoxicillin Other (See Comments)    LIGHT-HEADED and CLOSE TO "PASSING OUT"  AND ABDOMINAL PAIN Has patient had a PCN reaction causing immediate rash, facial/tongue/throat swelling, SOB or lightheadedness with hypotension: No Has patient had a PCN reaction causing severe rash involving mucus membranes or skin necrosis: No Has patient had a PCN reaction that required hospitalization No Has patient had a PCN reaction occurring within the last 10 years: No If all of the above answers are "NO", then may proceed with Cephalosporin use.   Indomethacin Other (See Comments)    Gastro irritation   Nsaids     GI ulcers    Metronidazole Nausea And Vomiting   Sulfa Antibiotics Rash and Other (See Comments)    Very bad yeast infection      Social History   Socioeconomic History   Marital status: Single    Spouse name: Not on file   Number of children: 1   Years of education: Not on file   Highest education level: Not on file  Occupational History   Occupation: Tax inspector, pharmacy tech  Tobacco Use   Smoking status: Former    Packs/day: 0.33    Years: 10.00    Total pack years: 3.30    Types: Cigarettes    Quit date: 10/04/2011    Years since quitting: 10.0   Smokeless tobacco: Never  Vaping Use   Vaping Use: Never used  Substance and Sexual Activity   Alcohol use: Yes    Alcohol/week: 0.0 standard drinks of alcohol    Comment: social   Drug use: No   Sexual activity: Yes  Other Topics Concern   Not on file  Social History Narrative    Single, lives alone with her children   Lives in [place]    worked Customer service manager at [employer]    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 [employer]   As of 2022 on disability due to pulm artery htn       Children: boys   Grew up in [place] Determinants of Health   Financial Resource Strain: Not on [employer] Insecurity: Not on file  Transportation Needs: Not on file  Physical Activity: Not on file  Stress: Not on file  Social Connections: Not on file  Intimate Partner Violence: Not on file      Family History  Problem Relation Age of Onset   Hypertension Mother    Kidney disease Mother    Heart disease Mother    Alcoholism Mother    Hypertension Father    Heart disease Father    Stroke Sister    Hypertension Brother    Breast cancer Maternal Grandmother        died at 77   Breast cancer Paternal Grandmother    Cancer Maternal Aunt        unknown form   Breast cancer Paternal Aunt    Breast cancer Paternal Aunt    Breast cancer Cousin        pat first cousin dx in her 60s   Colon cancer Other 20       MGMs brother   Cancer Other        breast ca in [place]   Cancer Other        g uncle colon or stomach ca   Wt Readings from Last 3  Encounters:  10/14/21 109.4 kg (241 lb 3.2 oz)  09/08/21 108.4 kg (239 lb)  08/05/21 106.6 kg (235 lb)   BP (!) 144/88   Pulse (!) 105   Wt 109.4 kg (241 lb 3.2 oz)   LMP 05/11/2013   SpO2 92% Comment: 2l n/c  BMI 40.14 kg/m   PHYSICAL EXAM: General:  Well appearing. No resp difficulty HEENT: normal Neck: supple. no JVD. Carotids 2+ bilat; no bruits. No lymphadenopathy or thryomegaly appreciated. Cor: PMI nondisplaced. Tachy regular  No rubs, gallops or murmurs. Lungs: clear Abdomen: obese soft, nontender, nondistended. No hepatosplenomegaly. No bruits or masses. Good bowel sounds. Extremities: no cyanosis, clubbing, rash, edema Neuro: alert & orientedx3, cranial nerves grossly intact. moves all 4 extremities w/o  difficulty. Affect pleasant   ECG (personally reviewed): ST 109bpm  ASSESSMENT & PLAN:  1. PAH  - WHO Group I due to [place]. H/o PE but pulmonary angiography 2017 without chronic PE - Followed closely at [facility] on triple therapy with IV treprostinil (started 10/20), sildenafil 40 tid and macitentan 10 daily - NYHA III  - Echo 8/22 LVEF 60-65% RV moderately dilated and HK. TR jet inadequate to estimate RVSP.  - RHC at [facility] (12/22) showed mPAP 37, PVR 3.3 WU, normal CO/CI, normal RA and PCW pressures.  - Echo 91/23) at [facility] EF 55%, RV ok - May be candidate for sotaracept when avalable - We will defer further management of her PAH to Dr.LeVarge and the [facility] team.   2. OSA - continue CPAP  3. Obesity - Body mass index is 40.14 kg/m. - Followed at [facility] - Consider referral to PharmD for Ozempic  4. Palpitations/tachycardia - ECG with sinus tach today - will place Zio  to further evalaute  Tiffany Bickers, MD  5:41 PM

## 2021-10-14 NOTE — Patient Instructions (Signed)
Your provider has recommended that  you wear a Zio Patch for 14 days.  This monitor will record your heart rhythm for our review.  IF you have any symptoms while wearing the monitor please press the button.  If you have any issues with the patch or you notice a red or orange light on it please call the company at [phone].  Once you remove the patch please mail it back to the company as soon as possible so we can get the results.  Your physician recommends that you schedule a follow-up appointment in: 1 year, **PLEASE CALL OUR OFFICE IN MAY 2024 TO SCHEDULE THIS APPOINTMENT  If you have any questions or concerns before your next appointment please send [place] a message through Wise or call our office at [phone].    TO LEAVE A MESSAGE FOR THE NURSE SELECT OPTION 2, PLEASE LEAVE A MESSAGE INCLUDING: YOUR NAME DATE OF BIRTH CALL BACK NUMBER REASON FOR CALL**this is important as we prioritize the call backs  YOU WILL RECEIVE A CALL BACK THE SAME DAY AS LONG AS YOU CALL BEFORE 4:00 PM

## 2021-10-15 ENCOUNTER — Ambulatory Visit: Payer: 59 | Admitting: Hematology and Oncology

## 2021-10-21 ENCOUNTER — Ambulatory Visit (INDEPENDENT_AMBULATORY_CARE_PROVIDER_SITE_OTHER): Payer: Medicare Other | Admitting: Nurse Practitioner

## 2021-10-21 ENCOUNTER — Encounter (INDEPENDENT_AMBULATORY_CARE_PROVIDER_SITE_OTHER): Payer: Self-pay | Admitting: Nurse Practitioner

## 2021-10-21 VITALS — BP 138/76 | HR 91 | Temp 98.0°F | Ht 65.0 in | Wt 230.0 lb

## 2021-10-21 DIAGNOSIS — K760 Fatty (change of) liver, not elsewhere classified: Secondary | ICD-10-CM

## 2021-10-21 DIAGNOSIS — Z6838 Body mass index (BMI) 38.0-38.9, adult: Secondary | ICD-10-CM | POA: Diagnosis not present

## 2021-10-21 DIAGNOSIS — E669 Obesity, unspecified: Secondary | ICD-10-CM

## 2021-10-22 NOTE — Progress Notes (Signed)
Patient Care Team: McLean-Scocuzza, Nino Glow, MD as PCP - General (Internal Medicine) Vonna Drafts, FNP as Nurse Practitioner (Nurse Practitioner) Stark Klein, MD as Consulting Physician (General Surgery) Nicholas Lose, MD as Consulting Physician (Hematology and Oncology) Gery Pray, MD as Consulting Physician (Radiation Oncology) Delice Bison Charlestine Massed, NP as Nurse Practitioner (Hematology and Oncology)  DIAGNOSIS: No diagnosis found.  SUMMARY OF ONCOLOGIC HISTORY: Oncology History  Malignant neoplasm of upper-inner quadrant of left breast in female, estrogen receptor positive ([place])  03/04/2016 Initial Diagnosis   Screening detected left breast density posterior medial 1.4 cm by ultrasound axilla negative, grade 3 IDC ER 60%, PR 0%, Ki-67 30%, HER-2 positive ratio 6.04, copy #16; T1 cN0 stage IA clinical stage   03/21/2016 Genetic Testing   NEgative genetic testing on the comprehensive cancer panel and Negative genetic testing for the MSH2 inversion analysis ([place] inversion). The Comprehensive Cancer Panel offered by [facility] includes sequencing and/or deletion duplication testing of the following 32 genes: APC, ATM, AXIN2, BARD1, BMPR1A, BRCA1, BRCA2, BRIP1, CDH1, CDK4, CDKN2A, CHEK2, EPCAM, FANCC, MLH1, MSH2, MSH6, MUTYH, NBN, PALB2, PMS2, POLD1, POLE, PTEN, RAD51C, RAD51D, SCG5/GREM1, SMAD4, STK11, TP53, VHL, and XRCC2.   The report date is March 21, 2016.   07/27/2016 Surgery   Left lumpectomy: IDC grade 2, 2.6 cm, DCIS intermediate grade,0/3 lymph nodes negative, ER 60%, PR 0%, Ki-67 30%, HER-2 positive ratio 6.04, T2 N0 stage II a   09/01/2016 - 11/03/2016 Chemotherapy   Abraxane Herceptin weekly 12 followed by Herceptin maintenance every 3 weeks     11/19/2016 - 01/20/2017 Radiation Therapy   Adjuvant radiation therapy   01/29/2017 -  Anti-estrogen oral therapy   Anastrozole 2.5 mg daily     CHIEF COMPLIANT:   INTERVAL HISTORY: [name] is  a   ALLERGIES:  is allergic to ampicillin, amoxicillin, indomethacin, nsaids, metronidazole, and sulfa antibiotics.  MEDICATIONS:  Current Outpatient Medications  Medication Sig Dispense Refill   acetaminophen (TYLENOL) 500 MG tablet Take 1,000 mg by mouth every 6 (six) hours as needed for moderate pain or headache.      albuterol (ACCUNEB) 1.25 MG/3ML nebulizer solution Take 3 mLs (1.25 mg total) by nebulization every 6 (six) hours as needed for wheezing. 360 mL 12   albuterol (VENTOLIN HFA) 108 (90 Base) MCG/ACT inhaler Inhale 1-2 puffs into the lungs every 6 (six) hours as needed for wheezing or shortness of breath. 18 g 11   allopurinol (ZYLOPRIM) 300 MG tablet TAKE 1 TABLET BY MOUTH EVERY DAY NEED APPT 90 tablet 1   anastrozole (ARIMIDEX) 1 MG tablet TAKE 1 TABLET BY MOUTH EVERY DAY 90 tablet 3   Azelastine HCl 137 MCG/SPRAY SOLN PLACE 1 SPRAY INTO BOTH NOSTRILS 2 (TWO) TIMES DAILY. USE IN EACH NOSTRIL AS DIRECTED 30 mL 4   clotrimazole-betamethasone (LOTRISONE) cream Apply 1 application topically 2 (two) times daily. Prn leg dermatitis 45 g 0   Colchicine (MITIGARE) 0.6 MG CAPS Take 1 capsule by mouth as needed. As needed gout flare max # of pills 2 in 24 hours 30 capsule 11   fluticasone (FLONASE) 50 MCG/ACT nasal spray Place into both nostrils daily as needed for allergies or rhinitis.     Fluticasone-Salmeterol (ADVAIR HFA IN) Inhale 1 puff into the lungs as needed.     ibuprofen (ADVIL) 800 MG tablet Take 1 tablet (800 mg total) by mouth every 8 (eight) hours as needed. 90 tablet 2   loratadine (CLARITIN) 10 MG tablet Take 10 mg by mouth  daily.     Magnesium Cl-Calcium Carbonate (SLOW MAGNESIUM/CALCIUM) 70-117 MG TBEC Take 2 tablets by mouth in the morning and at bedtime.     metoprolol tartrate (LOPRESSOR) 25 MG tablet TAKE 1 TABLET BY MOUTH TWICE A DAY 180 tablet 3   Multiple Vitamin (MULTIVITAMIN) tablet Take 1 tablet by mouth daily.     omeprazole (PRILOSEC) 40 MG capsule Take 40  mg by mouth as needed.     OPSUMIT 10 MG TABS Take 10 mg by mouth daily.  8   Polyethyl Glycol-Propyl Glycol (SYSTANE OP) Place 1 drop into both eyes 3 (three) times daily as needed (dry/irritated eyes.).     potassium chloride SA (KLOR-CON) 20 MEQ tablet Take 1 tablet (20 mEq total) by mouth daily.     rivaroxaban (XARELTO) 20 MG TABS tablet Take 20 mg by mouth daily with supper.     sildenafil (REVATIO) 20 MG tablet Take 1-2 tablets (20-40 mg total) by mouth 3 (three) times daily. As of 07/03/20 taking 20 mg tid [facility] Dr. Harrietta Guardian 10 tablet 0   spironolactone (ALDACTONE) 50 MG tablet Take 1 tablet (50 mg total) by mouth daily. 90 tablet 3   torsemide (DEMADEX) 100 MG tablet Take 100 mg by mouth daily.      treprostinil (REMODULIN) 20 MG/20ML injection 20 ng/kg/min by Continuous infusion (non-IV) route continuous.     triamcinolone cream (KENALOG) 0.1 % Apply 1 application topically daily as needed (leg discoloration). 454 g 2   Vitamin D, Ergocalciferol, (DRISDOL) 1.25 MG (50000 UNIT) CAPS capsule Take 1 capsule (50,000 Units total) by mouth every 7 (seven) days. 5 capsule 0   No current facility-administered medications for this visit.   Facility-Administered Medications Ordered in Other Visits  Medication Dose Route Frequency Provider Last Rate Last Admin   heparin lock flush 100 unit/mL  500 Units Intracatheter Once PRN Nicholas Lose, MD       sodium chloride flush (NS) 0.9 % injection 10 mL  10 mL Intracatheter PRN Nicholas Lose, MD        PHYSICAL EXAMINATION: ECOG PERFORMANCE STATUS: {CHL ONC ECOG VG:[id number]}  There were no vitals filed for this visit. There were no vitals filed for this visit.  BREAST:*** No palpable masses or nodules in either right or left breasts. No palpable axillary supraclavicular or infraclavicular adenopathy no breast tenderness or nipple discharge. (exam performed in the presence of a chaperone)  LABORATORY DATA:  I have reviewed the data as  listed    Latest Ref Rng & Units 11/21/2020   10:02 AM 10/25/2020   10:20 AM 03/05/2020    2:09 PM  CMP  Glucose 70 - 99 mg/dL 116  119  102   BUN 6 - 20 mg/dL _0 Creatinine 0.44 - 1.00 mg/dL 0.89  0.96  0.91   Sodium 135 - 145 mmol/L 140  139  143   Potassium 3.5 - 5.1 mmol/L 3.7  3.5  3.3   Chloride 98 - 111 mmol/L 104  101  104   CO2 22 - 32 mmol/L _1 Calcium 8.9 - 10.3 mg/dL 8.9  8.9  8.7   Total Protein 6.5 - 8.1 g/dL 7.8  7.4  7.6   Total Bilirubin 0.3 - 1.2 mg/dL 0.7  0.8  0.8   Alkaline Phos 38 - 126 U/L 97  93    108  155   AST 15 - 41 U/L 12  12  9   ALT 0 - 44 U/L _0 Lab Results  Component Value Date   WBC 6.2 07/03/2021   HGB 11.5 (L) 07/03/2021   HCT 34.2 (L) 07/03/2021   MCV 94.4 07/03/2021   PLT 124.0 (L) 07/03/2021   NEUTROABS 4.8 07/03/2021    ASSESSMENT & PLAN:  No problem-specific Assessment & Plan notes found for this encounter.    No orders of the defined types were placed in this encounter.  The patient has a good understanding of the overall plan. she agrees with it. she will call with any problems that may develop before the next visit here. Total time spent: 30 mins including face to face time and time spent for planning, charting and co-ordination of care   Suzzette Righter, [place] 10/22/21    I Gardiner Coins am scribing for Dr. Lindi Adie  ***

## 2021-10-23 ENCOUNTER — Inpatient Hospital Stay: Payer: Medicare Other | Attending: Hematology and Oncology | Admitting: Hematology and Oncology

## 2021-10-23 ENCOUNTER — Inpatient Hospital Stay: Payer: Medicare Other

## 2021-10-23 VITALS — BP 128/70 | HR 98 | Temp 97.2°F | Resp 19 | Ht 65.0 in | Wt 233.5 lb

## 2021-10-23 DIAGNOSIS — Z7951 Long term (current) use of inhaled steroids: Secondary | ICD-10-CM | POA: Insufficient documentation

## 2021-10-23 DIAGNOSIS — Z79899 Other long term (current) drug therapy: Secondary | ICD-10-CM | POA: Diagnosis not present

## 2021-10-23 DIAGNOSIS — R21 Rash and other nonspecific skin eruption: Secondary | ICD-10-CM | POA: Diagnosis not present

## 2021-10-23 DIAGNOSIS — C50212 Malignant neoplasm of upper-inner quadrant of left female breast: Secondary | ICD-10-CM

## 2021-10-23 DIAGNOSIS — D696 Thrombocytopenia, unspecified: Secondary | ICD-10-CM | POA: Insufficient documentation

## 2021-10-23 DIAGNOSIS — Z170 Estrogen receptor positive status [ER+]: Secondary | ICD-10-CM | POA: Diagnosis not present

## 2021-10-23 DIAGNOSIS — Z79811 Long term (current) use of aromatase inhibitors: Secondary | ICD-10-CM | POA: Insufficient documentation

## 2021-10-23 DIAGNOSIS — Z9221 Personal history of antineoplastic chemotherapy: Secondary | ICD-10-CM | POA: Diagnosis not present

## 2021-10-23 DIAGNOSIS — Z923 Personal history of irradiation: Secondary | ICD-10-CM | POA: Insufficient documentation

## 2021-10-23 DIAGNOSIS — D649 Anemia, unspecified: Secondary | ICD-10-CM | POA: Diagnosis not present

## 2021-10-23 DIAGNOSIS — Z7901 Long term (current) use of anticoagulants: Secondary | ICD-10-CM | POA: Insufficient documentation

## 2021-10-23 LAB — CBC WITH DIFFERENTIAL (CANCER CENTER ONLY)
Abs Immature Granulocytes: 0.01 10*3/uL (ref 0.00–0.07)
Basophils Absolute: 0 10*3/uL (ref 0.0–0.1)
Basophils Relative: 0 %
Eosinophils Absolute: 0.2 10*3/uL (ref 0.0–0.5)
Eosinophils Relative: 3 %
HCT: 36 % (ref 36.0–46.0)
Hemoglobin: 12.1 g/dL (ref 12.0–15.0)
Immature Granulocytes: 0 %
Lymphocytes Relative: 16 %
Lymphs Abs: 1 10*3/uL (ref 0.7–4.0)
MCH: 30.4 pg (ref 26.0–34.0)
MCHC: 33.6 g/dL (ref 30.0–36.0)
MCV: 90.5 fL (ref 80.0–100.0)
Monocytes Absolute: 0.4 10*3/uL (ref 0.1–1.0)
Monocytes Relative: 6 %
Neutro Abs: 4.6 10*3/uL (ref 1.7–7.7)
Neutrophils Relative %: 75 %
Platelet Count: 101 10*3/uL — ABNORMAL LOW (ref 150–400)
RBC: 3.98 MIL/uL (ref 3.87–5.11)
RDW: 13.1 % (ref 11.5–15.5)
WBC Count: 6.2 10*3/uL (ref 4.0–10.5)
nRBC: 0 % (ref 0.0–0.2)

## 2021-10-23 LAB — CMP (CANCER CENTER ONLY)
ALT: 10 U/L (ref 0–44)
AST: 12 U/L — ABNORMAL LOW (ref 15–41)
Albumin: 4.1 g/dL (ref 3.5–5.0)
Alkaline Phosphatase: 81 U/L (ref 38–126)
Anion gap: 7 (ref 5–15)
BUN: 22 mg/dL — ABNORMAL HIGH (ref 6–20)
CO2: 30 mmol/L (ref 22–32)
Calcium: 9.4 mg/dL (ref 8.9–10.3)
Chloride: 106 mmol/L (ref 98–111)
Creatinine: 1.01 mg/dL — ABNORMAL HIGH (ref 0.44–1.00)
GFR, Estimated: >60 mL/min (ref >60)
Glucose, Bld: 110 mg/dL — ABNORMAL HIGH (ref 70–99)
Potassium: 3.8 mmol/L (ref 3.5–5.1)
Sodium: 143 mmol/L (ref 135–145)
Total Bilirubin: 0.6 mg/dL (ref 0.3–1.2)
Total Protein: 7.7 g/dL (ref 6.5–8.1)

## 2021-10-23 NOTE — Assessment & Plan Note (Addendum)
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 Adjuvant chemotherapy with Abraxane and Herceptin (cannot take Taxol because she could not receive steroids)weekly 12 followed by every 3 week Herceptin for one yearuntil May 2019 2. followed by adjuvant radiation08/29/2018-01/05/2017 3. Followed by adjuvant antiestrogen therapystarted 01/28/2017 ------------------------------------------------------------------------------------------------------------------------------- Treatment plan: Adjuvant antiestrogen therapy withanastrozole 1 mg daily 01/28/2017 Adjuvant Herceptin completed May 2019  Anastrozole toxicities:Denies any hot flashes arthralgias or myalgias. Pulmonary hypertension: This is probably her biggest problem currently.  Uses 24-hour oxygen Recurrent gout  Surveillance: 1. Breast exam8/25/2020:Benign. [facility]  [facility]  Thrombocytopenia and anemia: With low immature platelet fraction and suggest decreased production.  We will check labs today.  Lab review: 11/06/2020: Platelets 94 07/03/2021: Platelets 124 10/23/2021: Pending  Return to clinic in 1 year for follow-up with labs

## 2021-10-23 NOTE — Progress Notes (Signed)
Chief Complaint:   OBESITY [name] is here to discuss her progress with her obesity treatment plan along with follow-up of her obesity related diagnoses. [name] is on the Category 1 Plan and the Category 2 Plan and states she is following her eating plan approximately 0% of the time. [name] states she is walking 15-20 minutes 5 times per week.  Today's visit was #: 5 Starting weight: 241 lbs Starting date: 07/24/2021 Today's weight: 230 lbs Today's date: 10/21/2021 Total lbs lost to date: 11 lbs Total lbs lost since last in-office visit: 9  Interim History: [name] reports that she lost weight by not eating. Eating 1-2 meals and 1-2 snacks per day. She is not eating enough calories and protein. Her brother passed away and she has been out of town due to his funeral. Reports can eat very little and be fine. Drinking protein shake in am for breakfast. Then she eats whatever I can and whatever I want. Drinks lots of water. She has been eating out. "Summer is a hard time for me, I do not eat during the summer"!   Subjective:   1. NAFLD (nonalcoholic fatty liver disease) CT Abd/pelvis 05/15/17. Denies Abd pain. [name]'s last CMP was 04/04/21. She has seen GI.  Assessment/Plan:   1. NAFLD (nonalcoholic fatty liver disease) We discussed the likely diagnosis of non-alcoholic fatty liver disease and how this condition is obesity related. [name] was educated the importance of weight loss. [name] agreed to continue with her weight loss efforts with healthier diet and exercise as an essential part of her treatment plan. [name] will continue to follow up with PCP and GI.   2. Obesity, Current BMI 38.4 [name] is currently in the action stage of change. As such, her goal is to continue with weight loss efforts. She has agreed to the Category 1 Plan and the Category 2 Plan.   [name] is not a candidate for Phentermine, Qsymira or Contrave due to cardiac history. Wegovy not covered by insurance.    Exercise goals: As is.  Behavioral modification strategies: increasing lean protein intake, increasing water intake, and planning for success.  [name] has agreed to follow-up with our clinic in 3 weeks. She was informed of the importance of frequent follow-up visits to maximize her success with intensive lifestyle modifications for her multiple health conditions.   Objective:   Blood pressure 138/76, pulse 91, temperature 98 F (36.7 C), height _0  (1.651 m), weight 230 lb (104.3 kg), last menstrual period 05/11/2013, SpO2 95 %. Body mass index is 38.27 kg/m.  General: Cooperative, alert, well developed, in no acute distress. HEENT: Conjunctivae and lids unremarkable. Cardiovascular: Regular rhythm.  Lungs: Normal work of breathing. Neurologic: No focal deficits.   Lab Results  Component Value Date   CREATININE 0.89 11/21/2020   BUN 15 11/21/2020   NA 140 11/21/2020   K 3.7 11/21/2020   CL 104 11/21/2020   CO2 25 11/21/2020   Lab Results  Component Value Date   ALT 11 11/21/2020   AST 12 (L) 11/21/2020   ALKPHOS 97 11/21/2020   BILITOT 0.7 11/21/2020   Lab Results  Component Value Date   HGBA1C 5.5 07/03/2021   HGBA1C 5.5 10/25/2020   HGBA1C 5.5 03/05/2020   HGBA1C 5.0 10/05/2019   HGBA1C 6.4 12/22/2018   No results found for: "INSULIN" Lab Results  Component Value Date   TSH 0.82 07/03/2021   Lab Results  Component Value Date   CHOL 165 07/03/2021  HDL 44.50 07/03/2021   LDLCALC 97 07/03/2021   TRIG 118.0 07/03/2021   CHOLHDL 4 07/03/2021   Lab Results  Component Value Date   VD25OH 30.38 07/03/2021   VD25OH 60.62 10/25/2020   VD25OH 10.6 (L) 03/05/2020   Lab Results  Component Value Date   WBC 6.2 07/03/2021   HGB 11.5 (L) 07/03/2021   HCT 34.2 (L) 07/03/2021   MCV 94.4 07/03/2021   PLT 124.0 (L) 07/03/2021   Lab Results  Component Value Date   IRON 81 12/23/2020   TIBC 366 12/23/2020   FERRITIN 160 12/23/2020   Attestation  Statements:   Reviewed by clinician on day of visit: allergies, medications, problem list, medical history, surgical history, family history, social history, and previous encounter notes.  Time spent on visit including pre-visit chart review and post-visit care and charting was 30 minutes.   I, Brendell Tyus, RMA, am acting as transcriptionist for Everardo Pacific, FNP.  I have reviewed the above documentation for accuracy and completeness, and I agree with the above. Everardo Pacific, FNP

## 2021-10-31 DIAGNOSIS — J209 Acute bronchitis, unspecified: Secondary | ICD-10-CM | POA: Diagnosis not present

## 2021-10-31 DIAGNOSIS — I2721 Secondary pulmonary arterial hypertension: Secondary | ICD-10-CM | POA: Diagnosis not present

## 2021-10-31 DIAGNOSIS — J9611 Chronic respiratory failure with hypoxia: Secondary | ICD-10-CM | POA: Diagnosis not present

## 2021-10-31 DIAGNOSIS — I50812 Chronic right heart failure: Secondary | ICD-10-CM | POA: Diagnosis not present

## 2021-11-03 ENCOUNTER — Other Ambulatory Visit: Payer: Self-pay | Admitting: Internal Medicine

## 2021-11-03 DIAGNOSIS — J9801 Acute bronchospasm: Secondary | ICD-10-CM

## 2021-11-03 DIAGNOSIS — R053 Chronic cough: Secondary | ICD-10-CM

## 2021-11-03 DIAGNOSIS — J9611 Chronic respiratory failure with hypoxia: Secondary | ICD-10-CM

## 2021-11-03 MED ORDER — ALBUTEROL SULFATE HFA 108 (90 BASE) MCG/ACT IN AERS: 1.0000 | INHALATION_SPRAY | Freq: Four times a day (QID) | RESPIRATORY_TRACT | 11 refills | Status: AC | PRN

## 2021-11-05 ENCOUNTER — Encounter (INDEPENDENT_AMBULATORY_CARE_PROVIDER_SITE_OTHER): Payer: Self-pay

## 2021-11-06 DIAGNOSIS — R002 Palpitations: Secondary | ICD-10-CM | POA: Diagnosis not present

## 2021-11-10 ENCOUNTER — Telehealth (HOSPITAL_COMMUNITY): Payer: Self-pay | Admitting: Surgery

## 2021-11-10 NOTE — Telephone Encounter (Signed)
I called patient in reference to her Zio monitor.  The results indicate that she only wore the monitor for approximately 2 days.  She tells me that it "fell off".  She mailed it back in as she was unsure how to handle. I told her that we would call back with any further instructions if necessary.

## 2021-11-10 NOTE — Addendum Note (Signed)
Encounter addended by: Micki Riley, RN on: 11/10/2021 11:34 AM  Actions taken: Imaging Exam ended

## 2021-11-26 ENCOUNTER — Ambulatory Visit (INDEPENDENT_AMBULATORY_CARE_PROVIDER_SITE_OTHER): Payer: Medicare Other | Admitting: Adult Health

## 2021-12-02 ENCOUNTER — Ambulatory Visit (INDEPENDENT_AMBULATORY_CARE_PROVIDER_SITE_OTHER): Payer: Medicare Other | Admitting: Internal Medicine

## 2021-12-02 ENCOUNTER — Encounter: Payer: Self-pay | Admitting: Internal Medicine

## 2021-12-02 VITALS — BP 128/62 | HR 85 | Temp 97.9°F | Ht 65.0 in | Wt 236.4 lb

## 2021-12-02 DIAGNOSIS — J309 Allergic rhinitis, unspecified: Secondary | ICD-10-CM

## 2021-12-02 DIAGNOSIS — Z9989 Dependence on other enabling machines and devices: Secondary | ICD-10-CM

## 2021-12-02 DIAGNOSIS — M25511 Pain in right shoulder: Secondary | ICD-10-CM | POA: Diagnosis not present

## 2021-12-02 DIAGNOSIS — M109 Gout, unspecified: Secondary | ICD-10-CM

## 2021-12-02 DIAGNOSIS — Z1231 Encounter for screening mammogram for malignant neoplasm of breast: Secondary | ICD-10-CM | POA: Diagnosis not present

## 2021-12-02 DIAGNOSIS — M25512 Pain in left shoulder: Secondary | ICD-10-CM

## 2021-12-02 DIAGNOSIS — J9611 Chronic respiratory failure with hypoxia: Secondary | ICD-10-CM

## 2021-12-02 DIAGNOSIS — I272 Pulmonary hypertension, unspecified: Secondary | ICD-10-CM

## 2021-12-02 DIAGNOSIS — G4733 Obstructive sleep apnea (adult) (pediatric): Secondary | ICD-10-CM

## 2021-12-02 DIAGNOSIS — G8929 Other chronic pain: Secondary | ICD-10-CM

## 2021-12-02 MED ORDER — LORATADINE 10 MG PO TABS: 10.0000 mg | ORAL_TABLET | Freq: Every day | ORAL | 3 refills | Status: AC

## 2021-12-02 MED ORDER — ALLOPURINOL 300 MG PO TABS: ORAL_TABLET | ORAL | 3 refills | Status: DC

## 2021-12-02 MED ORDER — COLCHICINE 0.6 MG PO CAPS: 1.0000 | ORAL_CAPSULE | ORAL | 11 refills | Status: AC | PRN

## 2021-12-02 MED ORDER — IBUPROFEN 800 MG PO TABS: 800.0000 mg | ORAL_TABLET | Freq: Three times a day (TID) | ORAL | 1 refills | Status: AC | PRN

## 2021-12-02 NOTE — Patient Instructions (Addendum)
Dr. Billey Gosling -call and schedule an appt in 3-6 months    Phone Fax E-mail Address  [phone] Not available [place] [address]     Specialties     Internal Medicine            [facility] in [place], [place] Get online care: [email or web address] Address: [address] [place], [place] [address] Hours:  Open ? Closes 5:30?PM Phone: [phone]

## 2021-12-02 NOTE — Progress Notes (Signed)
Chief Complaint  Patient presents with   Annual Exam   F/u 1. Moderate Chronic shoulder pain R> left and reduced ROM right she does carry heavy O2 daily refer to ortho  2. Pulmonary HTN Osa on bipap and chronic hypoxia on 2L o2 continous 98% she needs w/in 30 days 6 min walk test by pulmonary Dr. Harrietta Guardian with RN laura and Judson Roch [phone] fax [phone] this has been an ongoing issue with [employer] not getting the order since 08/2021 and she can do tomorrow and she wants a lighter O2 as well [employer] # is [phone]    Review of Systems  Constitutional:  Negative for weight loss.  HENT:  Negative for hearing loss.   Eyes:  Negative for blurred vision.  Respiratory:  Positive for shortness of breath.   Cardiovascular:  Negative for chest pain.  Gastrointestinal:  Negative for abdominal pain and blood in stool.  Genitourinary:  Negative for dysuria.  Musculoskeletal:  Negative for falls and joint pain.  Skin:  Negative for rash.  Neurological:  Negative for headaches.  Psychiatric/Behavioral:  Negative for depression.    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   Menorrhagia    secondary to uterine fibroids   OSA (obstructive sleep apnea)    07/25/13 HST AHI 33/hr, severe hypoxemia O2 min 42% and 95% of the time <89%   PE (pulmonary embolism) 2014   bilateral   Prediabetes    Pulmonary artery hypertension (HCC)    Pulmonary nodule    (71m on loeft lower lobe) found on CT scan July 2014, repeat scan Jan 2015 showed less than 424m  Right ovarian cyst    noted 09/2012     S/P TAH (total abdominal hysterectomy) 06/07/2013   Sleep apnea    SOB (shortness of breath) on exertion    Stomach ulcer    Trichomoniasis    05/2011    Vitamin D deficiency    Past Surgical History:  Procedure Laterality Date   ABDOMINAL HYSTERECTOMY N/A 06/07/2013   Procedure: HYSTERECTOMY ABDOMINAL WITH BIALTERAL SALPINGECTOMY;  Surgeon: VaElveria RoyalsMD;  [facility];  Service: Gynecology;  Laterality: N/A;   BIOPSY  09/02/2017   Procedure: BIOPSY;  Surgeon: ScWilford CornerMD;  [facility];  Service: Endoscopy;;   BIOPSY  12/31/2020   Procedure: BIOPSY;  Surgeon: ScWilford CornerMD;  [facility];  Service: Endoscopy;;   BREAST LUMPECTOMY Left 07/27/2016   BREAST LUMPECTOMY WITH RADIOACTIVE SEED AND SENTINEL LYMPH NODE BIOPSY (Left)   BREAST LUMPECTOMY WITH RADIOACTIVE SEED AND SENTINEL LYMPH NODE BIOPSY Left 07/27/2016   Procedure: BREAST LUMPECTOMY WITH RADIOACTIVE SEED AND SENTINEL LYMPH NODE BIOPSY;  Surgeon: FaStark KleinMD;  [facility] Service: General;  Laterality: Left;   CARDIAC CATHETERIZATION N/A 12/24/2015   Procedure: Right Heart Cath;  Surgeon: JaAdrian ProwsMD;  [facility];  Service: Cardiovascular;  Laterality: N/A;   CESAREAN SECTION  COLONOSCOPY     COLONOSCOPY WITH PROPOFOL N/A 09/02/2017   Procedure: COLONOSCOPY WITH PROPOFOL;  Surgeon: Wilford Corner, MD;  [facility];  Service: Endoscopy;  Laterality: N/A;   COLONOSCOPY WITH PROPOFOL Bilateral 12/31/2020   Procedure: COLONOSCOPY WITH PROPOFOL;  Surgeon: Wilford Corner, MD;  [facility];  Service: Endoscopy;  Laterality: Bilateral;   ESOPHAGOGASTRODUODENOSCOPY (EGD) WITH PROPOFOL N/A 09/02/2017   Procedure: ESOPHAGOGASTRODUODENOSCOPY (EGD) WITH PROPOFOL;  Surgeon: Wilford Corner, MD;  [facility];  Service: Endoscopy;  Laterality: N/A;   ESOPHAGOGASTRODUODENOSCOPY (EGD) WITH PROPOFOL Bilateral 12/31/2020   Procedure:  ESOPHAGOGASTRODUODENOSCOPY (EGD) WITH PROPOFOL;  Surgeon: Wilford Corner, MD;  [facility];  Service: Endoscopy;  Laterality: Bilateral;   IR CV LINE INJECTION  07/27/2017   POLYPECTOMY  2008   Removal of uterine polyp   PORT-A-CATH REMOVAL Right 09/09/2017   Procedure: REMOVAL PORT-A-CATH;  Surgeon: Stark Klein, MD;  Location: [facility];  Service: General;  Laterality: Right;   PORTA CATH INSERTION  07/27/2016   PORTACATH PLACEMENT Right 07/27/2016   Procedure: INSERTION PORT-A-CATH;  Surgeon: Stark Klein, MD;  Location: [facility];  Service: General;  Laterality: Right;   SUBMUCOSAL INJECTION  09/02/2017   Procedure: SUBMUCOSAL INJECTION;  Surgeon: Wilford Corner, MD;  [facility];  Service: Endoscopy;;  epi injection   Family History  Problem Relation Age of Onset   Hypertension Mother    Kidney disease Mother    Heart disease Mother    Alcoholism Mother    Hypertension Father    Heart disease Father    Stroke Sister    Hypertension Brother    Breast cancer Maternal Grandmother        died at 58   Breast cancer Paternal Grandmother    Cancer Maternal Aunt        unknown form   Breast cancer Paternal Aunt    Breast cancer Paternal Aunt    Breast cancer Cousin        pat first cousin dx in her 31s   Colon cancer Other 73       MGMs brother   Cancer Other        breast ca in [place]   Cancer Other        g uncle colon or stomach ca   Social History   Socioeconomic History   Marital status: Single    Spouse name: Not on file   Number of children: 1   Years of education: Not on file   Highest education level: Not on file  Occupational History   Occupation: Tax inspector, pharmacy tech  Tobacco Use   Smoking status: Former    Packs/day: 0.33    Years: 10.00    Total pack years: 3.30    Types: Cigarettes    Quit date: 10/04/2011    Years since quitting: 10.1   Smokeless tobacco: Never  Vaping Use   Vaping Use: Never used   Substance and Sexual Activity   Alcohol use: Yes    Alcohol/week: 0.0 standard drinks of alcohol    Comment: social   Drug use: No   Sexual activity: Yes  Other Topics Concern   Not on file  Social History Narrative   Single, lives alone with her children   Lives in [place]    worked Customer service manager at [employer]    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    albuterol (ACCUNEB) 1.25 MG/3ML nebulizer solution Take 3 mLs (1.25 mg total) by nebulization every 6 (six) hours as needed for wheezing.   albuterol (PROAIR HFA) 108 (90 Base) MCG/ACT inhaler Inhale 1-2 puffs into the lungs every 6 (six) hours as needed for wheezing or shortness of breath.   anastrozole (ARIMIDEX) 1 MG tablet TAKE 1 TABLET BY MOUTH EVERY DAY   Azelastine HCl 137 MCG/SPRAY SOLN PLACE 1 SPRAY INTO BOTH NOSTRILS 2 (TWO) TIMES DAILY. USE IN EACH NOSTRIL AS DIRECTED   clotrimazole-betamethasone (LOTRISONE) cream Apply 1 application topically 2 (two) times daily. Prn leg dermatitis   fluticasone (FLONASE) 50 MCG/ACT nasal spray Place into both nostrils daily as needed for allergies or rhinitis.   Fluticasone-Salmeterol (ADVAIR HFA IN) Inhale 1 puff into the lungs as needed.   Magnesium Cl-Calcium Carbonate (SLOW MAGNESIUM/CALCIUM) 70-117 MG TBEC Take 2 tablets by mouth in the morning and at bedtime.   metoprolol tartrate (LOPRESSOR) 25 MG tablet TAKE 1 TABLET BY MOUTH TWICE A DAY   Multiple Vitamin  (MULTIVITAMIN) tablet Take 1 tablet by mouth daily.   omeprazole (PRILOSEC) 40 MG capsule Take 40 mg by mouth as needed.   OPSUMIT 10 MG TABS Take 10 mg by mouth daily.   Polyethyl Glycol-Propyl Glycol (SYSTANE OP) Place 1 drop into both eyes 3 (three) times daily as needed (dry/irritated eyes.).   potassium chloride SA (KLOR-CON) 20 MEQ tablet Take 1 tablet (20 mEq total) by mouth daily.   rivaroxaban (XARELTO) 20 MG TABS tablet Take 20 mg by mouth daily with supper.   sildenafil (REVATIO) 20 MG tablet Take 1-2 tablets (20-40 mg total) by mouth 3 (three) times daily. As of 07/03/20 taking 20 mg tid per [facility] Dr. Harrietta Guardian   spironolactone (ALDACTONE) 50 MG tablet Take 1 tablet (50 mg total) by mouth daily.   torsemide (DEMADEX) 100 MG tablet Take 100 mg by mouth daily.    treprostinil (REMODULIN) 20 MG/20ML injection 20 ng/kg/min by Continuous infusion (non-IV) route continuous.   triamcinolone cream (KENALOG) 0.1 % Apply 1 application topically daily as needed (leg discoloration).   Vitamin D, Ergocalciferol, (DRISDOL) 1.25 MG (50000 UNIT) CAPS capsule Take 1 capsule (50,000 Units total) by mouth every 7 (seven) days.   [DISCONTINUED] allopurinol (ZYLOPRIM) 300 MG tablet TAKE 1 TABLET BY MOUTH EVERY DAY NEED APPT   [DISCONTINUED] Colchicine (MITIGARE) 0.6 MG CAPS Take 1 capsule by mouth as needed. As needed gout flare max # of pills 2 in 24 hours   [DISCONTINUED] ibuprofen (ADVIL) 800 MG tablet Take 1 tablet (800 mg total) by mouth every 8 (eight) hours as needed.   [DISCONTINUED] loratadine (CLARITIN) 10 MG tablet Take 10 mg by mouth daily.   Allergies  Allergen Reactions   Ampicillin Other (See Comments)    Severe abdominal pain, dizziness (penicillin is okay)   Amoxicillin Other (See Comments)    LIGHT-HEADED and CLOSE TO "PASSING OUT"  AND ABDOMINAL PAIN Has patient had a PCN  reaction causing immediate rash, facial/tongue/throat swelling, SOB or lightheadedness with hypotension: No Has  patient had a PCN reaction causing severe rash involving mucus membranes or skin necrosis: No Has patient had a PCN reaction that required hospitalization No Has patient had a PCN reaction occurring within the last 10 years: No If all of the above answers are "NO", then may proceed with Cephalosporin use.   Indomethacin Other (See Comments)    Gastro irritation   Nsaids     GI ulcers    Metronidazole Nausea And Vomiting   Sulfa Antibiotics Rash and Other (See Comments)    Very bad yeast infection   Recent Results (from the past 2160 hour(s))  CMP ([facility] only)     Status: Abnormal   Collection Time: 10/23/21 11:50 AM  Result Value Ref Range   Sodium 143 135 - 145 mmol/L   Potassium 3.8 3.5 - 5.1 mmol/L   Chloride 106 98 - 111 mmol/L   CO2 30 22 - 32 mmol/L   Glucose, Bld 110 (H) 70 - 99 mg/dL    Comment: Glucose reference range applies only to samples taken after fasting for at least 8 hours.   BUN 22 (H) 6 - 20 mg/dL   Creatinine 1.01 (H) 0.44 - 1.00 mg/dL   Calcium 9.4 8.9 - 10.3 mg/dL   Total Protein 7.7 6.5 - 8.1 g/dL   Albumin 4.1 3.5 - 5.0 g/dL   AST 12 (L) 15 - 41 U/L   ALT 10 0 - 44 U/L   Alkaline Phosphatase 81 38 - 126 U/L   Total Bilirubin 0.6 0.3 - 1.2 mg/dL   GFR, Estimated >60 >60 mL/min    Comment: (NOTE) Calculated using the CKD-EPI Creatinine Equation (2021)    Anion gap 7 5 - 15    Comment: Performed at [facility], [place] [address] [place], [place] [address]  CBC with Differential ([facility] Only)     Status: Abnormal   Collection Time: 10/23/21 11:50 AM  Result Value Ref Range   WBC Count 6.2 4.0 - 10.5 K/uL   RBC 3.98 3.87 - 5.11 MIL/uL   Hemoglobin 12.1 12.0 - 15.0 g/dL   HCT 36.0 36.0 - 46.0 %   MCV 90.5 80.0 - 100.0 fL   MCH 30.4 26.0 - 34.0 pg   MCHC 33.6 30.0 - 36.0 g/dL   RDW 13.1 11.5 - 15.5 %   Platelet Count 101 (L) 150 - 400 K/uL   nRBC 0.0 0.0 - 0.2 %   Neutrophils Relative % 75 %   Neutro Abs 4.6  1.7 - 7.7 K/uL   Lymphocytes Relative 16 %   Lymphs Abs 1.0 0.7 - 4.0 K/uL   Monocytes Relative 6 %   Monocytes Absolute 0.4 0.1 - 1.0 K/uL   Eosinophils Relative 3 %   Eosinophils Absolute 0.2 0.0 - 0.5 K/uL   Basophils Relative 0 %   Basophils Absolute 0.0 0.0 - 0.1 K/uL   Immature Granulocytes 0 %   Abs Immature Granulocytes 0.01 0.00 - 0.07 K/uL    Comment: Performed at [facility], [place] [address], [place], [place] [address]   Objective  Body mass index is 39.34 kg/m. Wt Readings from Last 3 Encounters:  12/02/21 236 lb 6.4 oz (107.2 kg)  10/23/21 233 lb 8 oz (105.9 kg)  10/21/21 230 lb (104.3 kg)   Temp Readings from Last 3 Encounters:  12/02/21 97.9 F (36.6 C) (Oral)  10/23/21 Marland Kitchen)  97.2 F (36.2 C) (Temporal)  10/21/21 98 F (36.7 C)   BP Readings from Last 3 Encounters:  12/02/21 128/62  10/23/21 128/70  10/21/21 138/76   Pulse Readings from Last 3 Encounters:  12/02/21 85  10/23/21 98  10/21/21 91    Physical Exam Vitals and nursing note reviewed.  Constitutional:      Appearance: Normal appearance. She is well-developed and well-groomed.  HENT:     Head: Normocephalic and atraumatic.  Eyes:     Conjunctiva/sclera: Conjunctivae normal.     Pupils: Pupils are equal, round, and reactive to light.  Cardiovascular:     Rate and Rhythm: Normal rate and regular rhythm.     Heart sounds: Normal heart sounds. No murmur heard. Pulmonary:     Effort: Pulmonary effort is normal.     Breath sounds: Normal breath sounds.  Abdominal:     General: Abdomen is flat. Bowel sounds are normal.     Tenderness: There is no abdominal tenderness.  Musculoskeletal:        General: No tenderness.  Skin:    General: Skin is warm and dry.  Neurological:     General: No focal deficit present.     Mental Status: She is alert and oriented to person, place, and time. Mental status is at baseline.     Cranial Nerves: Cranial nerves 2-12 are intact.      Motor: Motor function is intact.     Coordination: Coordination is intact.     Gait: Gait is intact.  Psychiatric:        Attention and Perception: Attention and perception normal.        Mood and Affect: Mood and affect normal.        Speech: Speech normal.        Behavior: Behavior normal. Behavior is cooperative.        Thought Content: Thought content normal.        Cognition and Memory: Cognition and memory normal.        Judgment: Judgment normal.     Assessment  Plan  Chronic pain of both shoulders - Plan: ibuprofen (ADVIL) 800 MG tablet, Ambulatory referral to Orthopedic Surgery  Acute gout involving toe of left foot, unspecified cause - Plan: allopurinol (ZYLOPRIM) 300 MG tablet  Gout, unspecified cause, unspecified chronicity, unspecified site - Plan: Colchicine (MITIGARE) 0.6 MG CAPS  Allergic rhinitis, unspecified seasonality, unspecified trigger - Plan: loratadine (CLARITIN) 10 MG tablet  Chronic respiratory failure with hypoxia (HCC)-stable  Pulmonary HTN (HCC) OSA on CPAP  Pulmonary HTN Osa on bipap and chronic hypoxia on 2L o2 continous 98% she needs w/in 30 days 6 min walk test by pulmonary Dr. Harrietta Guardian with RN laura and Judson Roch [phone] fax [phone] this has been an ongoing issue with [employer] not getting the order since 08/2021 and she can do tomorrow and she wants a lighter O2 as well [employer] # is 24 [phone]  HM Flu declines 12/02/21 MMR immune  Prevnar 09/2020 consider pna 23 7.202 and shingrix declines 03/04/21  Hep A/B utd immune hep B -hep A 2/2 hep A immune covid 3/3 pfizer  consider 4th dose Tdap utd 06/21/17 Hep C negative 02/26/16    Pap smear -s/p TAH (w/o cervix only ovaries intact) DUB 2/2 fibroids, adenomyosis -follows with Dr. Josephina Shih OB/GYN pap neg 08/2008 last saw 01/2017 Dr. Benjie Karvonen -obtained records 04/14/17 visit    DEXA [facility] 06/28/17 normal    h/o breast cancer left  mammo  03/29/20 negative [facility] f/u h/o Dr. Lindi Adie 10/15/2020   mammo [facility] 07/05/2018 abnormal with new right breast cal. H/o left breast cancer  -breast bx 09/27/18 right breast negative   -f/u [facility] h/o due in 01/2019 and [facility] 03/2019 due to insurance reasons initial care was with h/o (Dr. Renold Genta surgery (Dr. Barry Dienes) in [place] of note  03/29/20 [facility] mammo neg except chronic changes left breast f/u yearly and Dr. Lindi Adie due to see 09/2021 Ordered mammo 03/29/21 dx dx 03/05/16 left breast cancer 03/20/21 [facility] mammogram ordered 03/20/22   Colonoscopy/EGD had 09/02/17 [facility] Dr. Michail Sermon diverticulosis/hemorrhooids f/u in 10 years   10/05/19 echo as of 10/25/20 echo  1. Left ventricular ejection fraction, by estimation, is 60 to 65%. The  left ventricle has normal function. The left ventricle has no regional  wall motion abnormalities. Left ventricular diastolic function could not  be evaluated. There is the  interventricular septum is flattened in systole, consistent with right  ventricular pressure overload.   2. Right ventricular systolic function is moderately reduced. The right  ventricular size is severely enlarged. Tricuspid regurgitation signal is  inadequate for assessing PA pressure.   3. Right atrial size was moderately dilated.   4. The mitral valve is grossly normal. No evidence of mitral valve  regurgitation. No evidence of mitral stenosis.   5. The aortic valve was not well visualized. Aortic valve regurgitation  is not visualized. No aortic stenosis is present.   6. The inferior vena cava is dilated in size with <50% respiratory  variability, suggesting right atrial pressure of 15 mmHg.   She is former smoker from mid 46s age 45 to 50 3 cig per day    rec healthy diet and exercise    Specialists pul[facility] Dr. Harrietta Guardian H/o Dr. Bethanie Dicker [facility] in [place], Dr. Christopher/ Dr. Haroldine Laws Surgery Dr. Barry Dienes Ob/gyn Dr. Benjie Karvonen >Dr. Garwin Brothers        Provider: Dr. Olivia Mackie Mc[facility]-Scocuzza-Internal Medicine

## 2021-12-03 NOTE — Progress Notes (Signed)
Call pt and inform  Spoke with [name] Dr. Harrietta Guardian there was delay in bipap, o2 due to pt changing insurance plans frequently per the clinic  6 min walk test cant be done at their office today but call their office to schedule asap  Also pt can call Dr. Quay Burow to schedule [facility] appt  MD Physician   Primary Contact Information  Phone Fax E-mail Address  [phone] Not available [place] [address]     Specialties     Internal Medicine

## 2021-12-10 ENCOUNTER — Telehealth: Payer: Self-pay | Admitting: Internal Medicine

## 2021-12-10 NOTE — Telephone Encounter (Signed)
Patient would like a new referral to go to [facility] on [address]. She doesn't care for the other location.

## 2021-12-10 NOTE — Telephone Encounter (Signed)
What is the name of the ortho practice?

## 2021-12-11 NOTE — Addendum Note (Signed)
Addended by: Orland Mustard on: 12/11/2021 04:07 PM   Modules accepted: Orders

## 2021-12-11 NOTE — Telephone Encounter (Signed)
Resent referral

## 2022-01-12 ENCOUNTER — Encounter: Payer: Self-pay | Admitting: Internal Medicine

## 2022-01-19 ENCOUNTER — Encounter: Payer: Medicare Other | Admitting: Family

## 2022-02-09 ENCOUNTER — Encounter: Payer: Self-pay | Admitting: Internal Medicine

## 2022-02-10 ENCOUNTER — Ambulatory Visit (INDEPENDENT_AMBULATORY_CARE_PROVIDER_SITE_OTHER): Payer: Medicare Other | Admitting: Physician Assistant

## 2022-02-10 ENCOUNTER — Encounter (INDEPENDENT_AMBULATORY_CARE_PROVIDER_SITE_OTHER): Payer: Self-pay | Admitting: Physician Assistant

## 2022-02-10 ENCOUNTER — Other Ambulatory Visit (HOSPITAL_COMMUNITY): Payer: Self-pay

## 2022-02-10 ENCOUNTER — Encounter: Payer: Self-pay | Admitting: Internal Medicine

## 2022-02-10 VITALS — BP 117/76 | HR 92 | Temp 98.1°F | Ht 65.0 in | Wt 238.0 lb

## 2022-02-10 DIAGNOSIS — F3289 Other specified depressive episodes: Secondary | ICD-10-CM | POA: Diagnosis not present

## 2022-02-10 DIAGNOSIS — Z6839 Body mass index (BMI) 39.0-39.9, adult: Secondary | ICD-10-CM

## 2022-02-10 DIAGNOSIS — R809 Proteinuria, unspecified: Secondary | ICD-10-CM

## 2022-02-10 DIAGNOSIS — K760 Fatty (change of) liver, not elsewhere classified: Secondary | ICD-10-CM | POA: Diagnosis not present

## 2022-02-10 DIAGNOSIS — E1129 Type 2 diabetes mellitus with other diabetic kidney complication: Secondary | ICD-10-CM

## 2022-02-10 DIAGNOSIS — Z7984 Long term (current) use of oral hypoglycemic drugs: Secondary | ICD-10-CM

## 2022-02-10 DIAGNOSIS — E559 Vitamin D deficiency, unspecified: Secondary | ICD-10-CM

## 2022-02-10 MED ORDER — VITAMIN D (ERGOCALCIFEROL) 1.25 MG (50000 UNIT) PO CAPS: 50000.0000 [IU] | ORAL_CAPSULE | ORAL | 0 refills | Status: DC

## 2022-02-10 MED ORDER — OZEMPIC (0.25 OR 0.5 MG/DOSE) 2 MG/3ML ~~LOC~~ SOPN
0.2500 mg | PEN_INJECTOR | SUBCUTANEOUS | 0 refills | Status: DC
  Filled 2022-02-10: qty 3, 56d supply, fill #0

## 2022-02-10 MED ORDER — BUPROPION HCL ER (SR) 150 MG PO TB12: 150.0000 mg | ORAL_TABLET | Freq: Every day | ORAL | 0 refills | Status: DC

## 2022-02-16 ENCOUNTER — Other Ambulatory Visit (HOSPITAL_COMMUNITY): Payer: Self-pay

## 2022-02-16 ENCOUNTER — Ambulatory Visit: Payer: Medicare Other | Admitting: Family Medicine

## 2022-02-17 ENCOUNTER — Ambulatory Visit: Payer: Medicare Other | Admitting: Family Medicine

## 2022-02-17 NOTE — Progress Notes (Signed)
Chief Complaint:   OBESITY [name] is here to discuss her progress with her obesity treatment plan along with follow-up of her obesity related diagnoses. [name] is on the Category 1 Plan and the Category 2 Plan and states she is following her eating plan approximately 65-70% of the time. [name] states she is taking an exercise class 45 minutes 4 times per week.  Today's visit was #: 6 Starting weight: 241 lbs Starting date: 07/24/2021 Today's weight: 238 lbs Today's date: 02/09/2022 Total lbs lost to date: 3 lbs Total lbs lost since last in-office visit: 0  Interim History: [name] has been getting back on track with her healthy eating plan. Notes increase in cravings in evenings. Notes increase in hunger at times.  Subjective:   1. NAFLD (nonalcoholic fatty liver disease)  [name]'s liver enzymes all within normal limits last check.  2. Vitamin D deficiency [name] is currently taking prescription Vit D 50,000 IU once a week. Denies any nausea, vomiting or muscle weakness. Level of 30.8.  3. Type 2 diabetes mellitus with microalbuminuria, without long-term current use of insulin (HCC) [name]'s A1c 5.6--history of A1c 6.7 on 04/21/2016. She could not tolerate Metformin in the past due to GI upset. She would like to try GLP-1.  4. Other depression emotional eating behavior [name] reports cravings in the evenings. Discussed Wellbutrin for cravings and pt. Will discuss with pulmonary MD prior to starting medication.  Assessment/Plan:   1. NAFLD (nonalcoholic fatty liver disease) Continue to work on healthy eating and exercise.  2. Vitamin D deficiency We will refill Vit D 50,000 IU once a week for 1 month with 0 refills.  -Refill Vitamin D, Ergocalciferol, (DRISDOL) 1.25 MG (50000 UNIT) CAPS capsule; Take 1 capsule (50,000 Units total) by mouth every 7 (seven) days.  Dispense: 5 capsule; Refill: 0  3. Type 2 diabetes mellitus with microalbuminuria, without long-term current use  of insulin (HCC) Start Ozempic 0.25 mg SQ once weekly for 1 month with 0 refills. Continue eating plan and exercise.  -Start Semaglutide,0.25 or 0.5MG/DOS, (OZEMPIC, 0.25 OR 0.5 MG/DOSE,) 2 MG/3ML SOPN; Inject 0.25 mg into the skin once a week.  Dispense: 3 mL; Refill: 0  4. Other depression emotional eating behavior Start Wellbutrin Sr 150 mg daily for 1 month with 0 refills (if okay with pulmonary MD).  -Start buPROPion (WELLBUTRIN SR) 150 MG 12 hr tablet; Take 1 tablet (150 mg total) by mouth daily.  Dispense: 30 tablet; Refill: 0  5. Class 3 severe obesity with serious comorbidity and body mass index (BMI) of 39.7 [name] is currently in the action stage of change. As such, her goal is to continue with weight loss efforts. She has agreed to the Category 2 Plan.   Exercise goals: As is.  Behavioral modification strategies: increasing lean protein intake and emotional eating strategies.  [name] has agreed to follow-up with our clinic in 4 weeks. She was informed of the importance of frequent follow-up visits to maximize her success with intensive lifestyle modifications for her multiple health conditions.   Objective:   Blood pressure 117/76, pulse 92, temperature 98.1 F (36.7 C), height 5' 5" (1.651 m), weight 238 lb (108 kg), last menstrual period 05/11/2013, SpO2 96 %. Body mass index is 39.61 kg/m.  General: Cooperative, alert, well developed, in no acute distress. HEENT: Conjunctivae and lids unremarkable. Cardiovascular: Regular rhythm.  Lungs: Normal work of breathing. Neurologic: No focal deficits.   Lab Results  Component Value Date   CREATININE 1.01 (  H) 10/23/2021   BUN 22 (H) 10/23/2021   NA 143 10/23/2021   K 3.8 10/23/2021   CL 106 10/23/2021   CO2 30 10/23/2021   Lab Results  Component Value Date   ALT 10 10/23/2021   AST 12 (L) 10/23/2021   ALKPHOS 81 10/23/2021   BILITOT 0.6 10/23/2021   Lab Results  Component Value Date   HGBA1C 5.5 07/03/2021    HGBA1C 5.5 10/25/2020   HGBA1C 5.5 03/05/2020   HGBA1C 5.0 10/05/2019   HGBA1C 6.4 12/22/2018   No results found for: "INSULIN" Lab Results  Component Value Date   TSH 0.82 07/03/2021   Lab Results  Component Value Date   CHOL 165 07/03/2021   HDL 44.50 07/03/2021   LDLCALC 97 07/03/2021   TRIG 118.0 07/03/2021   CHOLHDL 4 07/03/2021   Lab Results  Component Value Date   VD25OH 30.38 07/03/2021   VD25OH 60.62 10/25/2020   VD25OH 10.6 (L) 03/05/2020   Lab Results  Component Value Date   WBC 6.2 10/23/2021   HGB 12.1 10/23/2021   HCT 36.0 10/23/2021   MCV 90.5 10/23/2021   PLT 101 (L) 10/23/2021   Lab Results  Component Value Date   IRON 81 12/23/2020   TIBC 366 12/23/2020   FERRITIN 160 12/23/2020   Attestation Statements:   Reviewed by clinician on day of visit: allergies, medications, problem list, medical history, surgical history, family history, social history, and previous encounter notes.  I, Brendell Tyus, am acting as transcriptionist for [employer], [employer].  I have reviewed the above documentation for accuracy and completeness, and I agree with the above. -  Jonice Cerra,PA-C

## 2022-03-10 ENCOUNTER — Other Ambulatory Visit (HOSPITAL_COMMUNITY): Payer: Self-pay

## 2022-03-10 ENCOUNTER — Ambulatory Visit (INDEPENDENT_AMBULATORY_CARE_PROVIDER_SITE_OTHER): Payer: Medicare Other | Admitting: Physician Assistant

## 2022-03-10 ENCOUNTER — Encounter (INDEPENDENT_AMBULATORY_CARE_PROVIDER_SITE_OTHER): Payer: Self-pay | Admitting: Physician Assistant

## 2022-03-10 VITALS — BP 134/87 | HR 94 | Temp 97.9°F | Ht 65.0 in | Wt 238.0 lb

## 2022-03-10 DIAGNOSIS — Z6839 Body mass index (BMI) 39.0-39.9, adult: Secondary | ICD-10-CM

## 2022-03-10 DIAGNOSIS — F3289 Other specified depressive episodes: Secondary | ICD-10-CM

## 2022-03-10 DIAGNOSIS — E559 Vitamin D deficiency, unspecified: Secondary | ICD-10-CM | POA: Diagnosis not present

## 2022-03-10 DIAGNOSIS — R809 Proteinuria, unspecified: Secondary | ICD-10-CM

## 2022-03-10 DIAGNOSIS — E669 Obesity, unspecified: Secondary | ICD-10-CM

## 2022-03-10 DIAGNOSIS — E1129 Type 2 diabetes mellitus with other diabetic kidney complication: Secondary | ICD-10-CM

## 2022-03-10 DIAGNOSIS — Z7985 Long-term (current) use of injectable non-insulin antidiabetic drugs: Secondary | ICD-10-CM

## 2022-03-10 MED ORDER — OZEMPIC (0.25 OR 0.5 MG/DOSE) 2 MG/3ML ~~LOC~~ SOPN
0.5000 mg | PEN_INJECTOR | SUBCUTANEOUS | 0 refills | Status: DC
  Filled 2022-03-10: qty 3, fill #0
  Filled 2022-03-20: qty 3, 28d supply, fill #0

## 2022-03-20 ENCOUNTER — Other Ambulatory Visit (HOSPITAL_COMMUNITY): Payer: Self-pay

## 2022-03-24 LAB — HM MAMMOGRAPHY

## 2022-03-26 NOTE — Progress Notes (Signed)
Chief Complaint:   OBESITY [name] is here to discuss her progress with her obesity treatment plan along with follow-up of her obesity related diagnoses. [name] is on the Category 2 Plan and states she is following her eating plan approximately 80% of the time. [name] states she is going to exercise classes 60 minutes 3-4 times per week.  Today's visit was #: 7 Starting weight: 241 lbs Starting date: 07/24/2021 Today's weight: 238 lbs Today's date: 03/10/2022 Total lbs lost to date: 3 lbs Total lbs lost since last in-office visit: 0  Interim History: [name] did well with maintaining over Thanksgiving.  Started GLP-1- Ozempic 0.25 mg weekly and notes decreased hunger/decreased appetite, but able to get in adequate protein/calories.  Not skipping meals. No side effects with Ozempic.  Subjective:   1. Type 2 diabetes mellitus with microalbuminuria, without long-term current use of insulin (HCC) A1c at 5.5-started on Ozempic 0.25 mg weekly (no N/V/D/C/D or neck mass).  Working on decreasing simple carbs, following healthy eating plan.  Will increase to Ozempic to 0.5 mg weekly.  2. Vitamin D deficiency Vitamin D level of 30.8 on 06/30/2021.  Taking  vitamin D 50,000 IU weekly with no side effects.    3. Other depression emotional eating behavior Working on strategies for emotional eating behavior.  Did not want to start Wellbutrin for now.  Assessment/Plan:   1. Type 2 diabetes mellitus with microalbuminuria, without long-term current use of insulin (HCC) Continue/Refill Ozempic 0.5 mg SQ once weekly for 1 month with 0 refills.  -Refill Semaglutide,0.25 or 0.5MG/DOS, (OZEMPIC, 0.25 OR 0.5 MG/DOSE,) 2 MG/3ML SOPN; Inject 0.5 mg into the skin once a week.  Dispense: 3 mL; Refill: 0  Continue prescribed nutrition plan to decrease simple carbohydrates, decrease saturated fat, increase lean proteins and exercise to promote weight loss.  2. Vitamin D deficiency Continue Vit D 50,000 IU  weekly.  3. Other depression emotional eating behavior Continue working on emotional eating strategies to manage cravings.  4. Obesity, Current BMI 39.7 [name] is currently in the action stage of change. As such, her goal is to continue with weight loss efforts. She has agreed to the Category 2 Plan.   Exercise goals: As is.  Behavioral modification strategies: increasing lean protein intake, decreasing simple carbohydrates, emotional eating strategies, and holiday eating strategies .  [name] has agreed to follow-up with our clinic in 4 weeks. She was informed of the importance of frequent follow-up visits to maximize her success with intensive lifestyle modifications for her multiple health conditions.   Objective:   Blood pressure 134/87, pulse 94, temperature 97.9 F (36.6 C), height _0  (1.651 m), weight 238 lb (108 kg), last menstrual period 05/11/2013, SpO2 94 %. Body mass index is 39.61 kg/m.  General: Cooperative, alert, well developed, in no acute distress. HEENT: Conjunctivae and lids unremarkable. Cardiovascular: Regular rhythm.  Lungs: Normal work of breathing. Neurologic: No focal deficits.   Lab Results  Component Value Date   CREATININE 1.01 (H) 10/23/2021   BUN 22 (H) 10/23/2021   NA 143 10/23/2021   K 3.8 10/23/2021   CL 106 10/23/2021   CO2 30 10/23/2021   Lab Results  Component Value Date   ALT 10 10/23/2021   AST 12 (L) 10/23/2021   ALKPHOS 81 10/23/2021   BILITOT 0.6 10/23/2021   Lab Results  Component Value Date   HGBA1C 5.5 07/03/2021   HGBA1C 5.5 10/25/2020   HGBA1C 5.5 03/05/2020   HGBA1C 5.0 10/05/2019  HGBA1C 6.4 12/22/2018   No results found for: "INSULIN" Lab Results  Component Value Date   TSH 0.82 07/03/2021   Lab Results  Component Value Date   CHOL 165 07/03/2021   HDL 44.50 07/03/2021   LDLCALC 97 07/03/2021   TRIG 118.0 07/03/2021   CHOLHDL 4 07/03/2021   Lab Results  Component Value Date   VD25OH 30.38 07/03/2021    VD25OH 60.62 10/25/2020   VD25OH 10.6 (L) 03/05/2020   Lab Results  Component Value Date   WBC 6.2 10/23/2021   HGB 12.1 10/23/2021   HCT 36.0 10/23/2021   MCV 90.5 10/23/2021   PLT 101 (L) 10/23/2021   Lab Results  Component Value Date   IRON 81 12/23/2020   TIBC 366 12/23/2020   FERRITIN 160 12/23/2020   Attestation Statements:   Reviewed by clinician on day of visit: allergies, medications, problem list, medical history, surgical history, family history, social history, and previous encounter notes.  I, Brendell Tyus, am acting as transcriptionist for [employer], [place].  I have reviewed the above documentation for accuracy and completeness, and I agree with the above. -  Kitty Cadavid,PA-C

## 2022-04-07 ENCOUNTER — Ambulatory Visit (INDEPENDENT_AMBULATORY_CARE_PROVIDER_SITE_OTHER): Payer: Medicare Other | Admitting: Physician Assistant

## 2022-04-07 ENCOUNTER — Ambulatory Visit: Payer: Medicare Other | Admitting: Family Medicine

## 2022-04-09 ENCOUNTER — Other Ambulatory Visit (HOSPITAL_COMMUNITY): Payer: Self-pay

## 2022-04-09 ENCOUNTER — Ambulatory Visit (INDEPENDENT_AMBULATORY_CARE_PROVIDER_SITE_OTHER): Payer: Medicare Other | Admitting: Physician Assistant

## 2022-04-09 ENCOUNTER — Encounter (INDEPENDENT_AMBULATORY_CARE_PROVIDER_SITE_OTHER): Payer: Self-pay | Admitting: Physician Assistant

## 2022-04-09 ENCOUNTER — Encounter: Payer: Self-pay | Admitting: Internal Medicine

## 2022-04-09 VITALS — BP 132/85 | HR 95 | Temp 98.2°F | Ht 65.0 in | Wt 238.0 lb

## 2022-04-09 DIAGNOSIS — Z7985 Long-term (current) use of injectable non-insulin antidiabetic drugs: Secondary | ICD-10-CM

## 2022-04-09 DIAGNOSIS — R809 Proteinuria, unspecified: Secondary | ICD-10-CM | POA: Diagnosis not present

## 2022-04-09 DIAGNOSIS — E1129 Type 2 diabetes mellitus with other diabetic kidney complication: Secondary | ICD-10-CM

## 2022-04-09 DIAGNOSIS — E669 Obesity, unspecified: Secondary | ICD-10-CM

## 2022-04-09 DIAGNOSIS — E559 Vitamin D deficiency, unspecified: Secondary | ICD-10-CM | POA: Diagnosis not present

## 2022-04-09 DIAGNOSIS — Z6839 Body mass index (BMI) 39.0-39.9, adult: Secondary | ICD-10-CM

## 2022-04-09 MED ORDER — VITAMIN D (ERGOCALCIFEROL) 1.25 MG (50000 UNIT) PO CAPS
50000.0000 [IU] | ORAL_CAPSULE | ORAL | 0 refills | Status: DC
  Filled 2022-04-09: qty 5, 35d supply, fill #0

## 2022-04-09 MED ORDER — TIRZEPATIDE 2.5 MG/0.5ML ~~LOC~~ SOAJ
2.5000 mg | SUBCUTANEOUS | 0 refills | Status: DC
  Filled 2022-04-09: qty 2, 28d supply, fill #0

## 2022-04-10 ENCOUNTER — Other Ambulatory Visit (HOSPITAL_COMMUNITY): Payer: Self-pay

## 2022-04-15 NOTE — Progress Notes (Signed)
Chief Complaint:   OBESITY [name] is here to discuss her progress with her obesity treatment plan along with follow-up of her obesity related diagnoses. [name] is on the Category 2 Plan and states she is following her eating plan approximately 85% of the time. [name] states she is going to workout classes 60 minutes 3-4 times per week.  Today's visit was #: 8 Starting weight: 241 lbs Starting date: 07/24/2021 Today's weight: 238 lbs Today's date: 04/09/2022 Total lbs lost to date: 3 lbs Total lbs lost since last in-office visit: 0  Interim History: [name] maintained her weight over the holidays.  She focused on getting an adequate protein and feels that this was helpful.  She is taking Ozempic 0.5 mg weekly.  She reports that her hunger is increased at times.  She is not having any side effects with Ozempic.  She reports she is interested in trying [facility] for management of her type 2 diabetes today.  She is again not had any difficulties/side effects with Ozempic. Patient denies a personal or family history of pancreatitis, medullary thyroid carcinoma or multiple endocrine neoplasia type II.   Subjective:   1. Type 2 diabetes mellitus with microalbuminuria, without long-term current use of insulin (HCC) On Ozempic 0.5 mg weekly--Denies any side effects.  Last A1c was at [facility], 5.6.  Interested in changing to [place].  Patient denies a personal or family history of pancreatitis, medullary thyroid carcinoma or multiple endocrine neoplasia type II. Recommend reviewing pen training video online.  We discussed changing to [facility], possible risk and benefits and the patient would like to try to switch to [facility] at this time.  We will plan to switch to [facility] pending insurance authorization. 2. Vitamin D deficiency Vit D level of 30.38 on 07/07/21-not at goal.  Taking ergocalciferol once weekly--Denies any side effects.  Assessment/Plan:   1. Type 2 diabetes mellitus with  microalbuminuria, without long-term current use of insulin (HCC) Start Mounjaro 2.5 mg SQ once a week for 1 month with 0 refills. Continue Prescribed Nutrition Plan and exercise to promote weight loss and improve glycemic control.  -START tirzepatide (MOUNJARO) 2.5 MG/0.5ML Pen; Inject 2.5 mg into the skin once a week.  Dispense: 2 mL; Refill: 0  2. Vitamin D deficiency Continue/Refill Ergocalciferol once a week for 1 month with 0 refills.  Will plan to recheck level 2-3 times yearly to avoid oversupplementation.  -Refill Vitamin D, Ergocalciferol, (DRISDOL) 1.25 MG (50000 UNIT) CAPS capsule; Take 1 capsule (50,000 Units total) by mouth every 7 (seven) days.  Dispense: 5 capsule; Refill: 0  3. Obesity, Current BMI 39.6 [name] is currently in the action stage of change. As such, her goal is to continue with weight loss efforts. She has agreed to the Category 2 Plan.   Exercise goals: All adults should avoid inactivity. Some physical activity is better than none, and adults who participate in any amount of physical activity gain some health benefits.  Add strengthening exercises.  Behavioral modification strategies: increasing lean protein intake, decreasing simple carbohydrates, and better snacking choices.  [name] has agreed to follow-up with our clinic in 4 weeks. She was informed of the importance of frequent follow-up visits to maximize her success with intensive lifestyle modifications for her multiple health conditions.   Objective:   Blood pressure 132/85, pulse 95, temperature 98.2 F (36.8 C), height _0  (1.651 m), weight 238 lb (108 kg), last menstrual period 05/11/2013, SpO2 98 %. Body mass index is 39.61 kg/m.  General: Cooperative, alert, well developed, in no acute distress. HEENT: Conjunctivae and lids unremarkable. Cardiovascular: Regular rhythm.  Lungs: Normal work of breathing. Neurologic: No focal deficits.   Lab Results  Component Value Date   CREATININE 1.01  (H) 10/23/2021   BUN 22 (H) 10/23/2021   NA 143 10/23/2021   K 3.8 10/23/2021   CL 106 10/23/2021   CO2 30 10/23/2021   Lab Results  Component Value Date   ALT 10 10/23/2021   AST 12 (L) 10/23/2021   ALKPHOS 81 10/23/2021   BILITOT 0.6 10/23/2021   Lab Results  Component Value Date   HGBA1C 5.5 07/03/2021   HGBA1C 5.5 10/25/2020   HGBA1C 5.5 03/05/2020   HGBA1C 5.0 10/05/2019   HGBA1C 6.4 12/22/2018   No results found for: "INSULIN" Lab Results  Component Value Date   TSH 0.82 07/03/2021   Lab Results  Component Value Date   CHOL 165 07/03/2021   HDL 44.50 07/03/2021   LDLCALC 97 07/03/2021   TRIG 118.0 07/03/2021   CHOLHDL 4 07/03/2021   Lab Results  Component Value Date   VD25OH 30.38 07/03/2021   VD25OH 60.62 10/25/2020   VD25OH 10.6 (L) 03/05/2020   Lab Results  Component Value Date   WBC 6.2 10/23/2021   HGB 12.1 10/23/2021   HCT 36.0 10/23/2021   MCV 90.5 10/23/2021   PLT 101 (L) 10/23/2021   Lab Results  Component Value Date   IRON 81 12/23/2020   TIBC 366 12/23/2020   FERRITIN 160 12/23/2020   Attestation Statements:   Reviewed by clinician on day of visit: allergies, medications, problem list, medical history, surgical history, family history, social history, and previous encounter notes.  I, Brendell Tyus, am acting as transcriptionist for [employer], [employer].  I have reviewed the above documentation for accuracy and completeness, and I agree with the above. -  Raymie Trani,PA-C

## 2022-04-28 ENCOUNTER — Encounter: Payer: Self-pay | Admitting: Internal Medicine

## 2022-04-30 ENCOUNTER — Ambulatory Visit (INDEPENDENT_AMBULATORY_CARE_PROVIDER_SITE_OTHER): Payer: 59 | Admitting: Nurse Practitioner

## 2022-04-30 ENCOUNTER — Encounter: Payer: Self-pay | Admitting: Nurse Practitioner

## 2022-04-30 VITALS — BP 138/80 | HR 84 | Temp 98.5°F | Ht 65.0 in | Wt 245.0 lb

## 2022-04-30 DIAGNOSIS — C50212 Malignant neoplasm of upper-inner quadrant of left female breast: Secondary | ICD-10-CM

## 2022-04-30 DIAGNOSIS — I110 Hypertensive heart disease with heart failure: Secondary | ICD-10-CM | POA: Diagnosis not present

## 2022-04-30 DIAGNOSIS — I5032 Chronic diastolic (congestive) heart failure: Secondary | ICD-10-CM

## 2022-04-30 DIAGNOSIS — E1129 Type 2 diabetes mellitus with other diabetic kidney complication: Secondary | ICD-10-CM | POA: Diagnosis not present

## 2022-04-30 DIAGNOSIS — R809 Proteinuria, unspecified: Secondary | ICD-10-CM

## 2022-04-30 DIAGNOSIS — I2721 Secondary pulmonary arterial hypertension: Secondary | ICD-10-CM

## 2022-04-30 DIAGNOSIS — Z9981 Dependence on supplemental oxygen: Secondary | ICD-10-CM

## 2022-04-30 DIAGNOSIS — Z0000 Encounter for general adult medical examination without abnormal findings: Secondary | ICD-10-CM

## 2022-04-30 DIAGNOSIS — R051 Acute cough: Secondary | ICD-10-CM

## 2022-04-30 DIAGNOSIS — J9801 Acute bronchospasm: Secondary | ICD-10-CM

## 2022-04-30 DIAGNOSIS — Z0001 Encounter for general adult medical examination with abnormal findings: Secondary | ICD-10-CM

## 2022-04-30 DIAGNOSIS — R4586 Emotional lability: Secondary | ICD-10-CM

## 2022-04-30 DIAGNOSIS — Z170 Estrogen receptor positive status [ER+]: Secondary | ICD-10-CM

## 2022-04-30 DIAGNOSIS — E876 Hypokalemia: Secondary | ICD-10-CM

## 2022-04-30 MED ORDER — HYDROCOD POLI-CHLORPHE POLI ER 10-8 MG/5ML PO SUER: 5.0000 mL | Freq: Two times a day (BID) | ORAL | 0 refills | Status: DC | PRN

## 2022-04-30 MED ORDER — ALBUTEROL SULFATE 1.25 MG/3ML IN NEBU: 1.0000 | INHALATION_SOLUTION | Freq: Four times a day (QID) | RESPIRATORY_TRACT | 12 refills | Status: AC | PRN

## 2022-04-30 NOTE — Progress Notes (Addendum)
I,Tianna Badgett,acting as a Education administrator for [employer], FNP.,have documented all relevant documentation on the behalf of Tiffany Brine, FNP,as directed by  Tiffany Brine, FNP while in the presence of [name], [name].  Subjective:     Patient ID: [name] , female    DOB: [birth year] , 57 y.o.   MRN: [id number]   Chief Complaint  Patient presents with   Establish Care    HPI  Patient presents today to establish care. She was a patient at [facility] on [address]. Last visit was in September. Her provider left the practice. 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 She stopped working in October 2020, she was having problems with keeping her oxygen level up, she has been on [employer] oxygen since then. She is followed by Dr. Suzi Roots. She is now on disability. They are coming up with a new medications that is every 3 months. She is wearing a BIPAP. Single. She has one daughter who lives in [place], [place]. She lives alone, was living with her father.   Hysterectomy - 2015.  She still has her ovaries.she received her medicare last year in March. Left breast cancer - 2018 - had lumpectomy, continues to see Dr. Lindi Adie.   She is on mounjaro given by [facility].   She had a cold about 1 month ago and was treated with steroids and antibiotics. Continues to have a lingering cough with phlegm.   She would like to discuss her mood.      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   Menorrhagia    secondary to uterine fibroids   OSA  (obstructive sleep apnea)    07/25/13 HST AHI 33/hr, severe hypoxemia O2 min 42% and 95% of the time <89%   PE (pulmonary embolism) 2014   bilateral   Prediabetes    Pulmonary artery hypertension (HCC)    Pulmonary nodule    (60m on loeft lower lobe) found on CT scan July 2014, repeat scan Jan 2015 showed less than 442m  Right ovarian cyst    noted 09/2012    S/P TAH (total abdominal hysterectomy) 06/07/2013   Sleep apnea    SOB (shortness of breath) on exertion    Stomach ulcer    Trichomoniasis    05/2011    Vitamin D deficiency      Family History  Problem Relation Age of Onset   Hypertension Mother    Kidney disease Mother    Heart disease Mother    Alcoholism Mother    Hypertension Father    Heart disease Father    Stroke Sister    Hypertension Brother    Breast cancer Maternal Grandmother        died at 5759 Breast cancer Paternal Grandmother    Cancer Maternal Aunt        unknown form   Breast cancer Paternal Aunt  Breast cancer Paternal Aunt    Breast cancer Cousin        pat first cousin dx in her 104s   Colon cancer Other 75       MGMs brother   Cancer Other        breast ca in [place]   Cancer Other        g uncle colon or stomach ca     Current Outpatient Medications:    acetaminophen (TYLENOL) 500 MG tablet, Take 1,000 mg by mouth every 6 (six) hours as needed for moderate pain or headache. , Disp: , Rfl:    albuterol (PROAIR HFA) 108 (90 Base) MCG/ACT inhaler, Inhale 1-2 puffs into the lungs every 6 (six) hours as needed for wheezing or shortness of breath., Disp: 18 g, Rfl: 11   allopurinol (ZYLOPRIM) 300 MG tablet, TAKE 1 TABLET BY MOUTH EVERY DAY NEED APPT, Disp: 90 tablet, Rfl: 3   anastrozole (ARIMIDEX) 1 MG tablet, TAKE 1 TABLET BY MOUTH EVERY DAY, Disp: 90 tablet, Rfl: 3   Azelastine HCl 137 MCG/SPRAY SOLN, PLACE 1 SPRAY INTO BOTH NOSTRILS 2 (TWO) TIMES DAILY. USE IN EACH NOSTRIL AS DIRECTED, Disp: 30 mL, Rfl: 4   clotrimazole-betamethasone  (LOTRISONE) cream, Apply 1 application topically 2 (two) times daily. Prn leg dermatitis, Disp: 45 g, Rfl: 0   Colchicine (MITIGARE) 0.6 MG CAPS, Take 1 capsule by mouth as needed. As needed gout flare max # of pills 2 in 24 hours, Disp: 30 capsule, Rfl: 11   fluticasone (FLONASE) 50 MCG/ACT nasal spray, Place into both nostrils daily as needed for allergies or rhinitis., Disp: , Rfl:    Fluticasone-Salmeterol (ADVAIR HFA IN), Inhale 1 puff into the lungs as needed., Disp: , Rfl:    ibuprofen (ADVIL) 800 MG tablet, Take 1 tablet (800 mg total) by mouth every 8 (eight) hours as needed., Disp: 90 tablet, Rfl: 1   loratadine (CLARITIN) 10 MG tablet, Take 1 tablet (10 mg total) by mouth daily., Disp: 90 tablet, Rfl: 3   Magnesium Cl-Calcium Carbonate (SLOW MAGNESIUM/CALCIUM) 70-117 MG TBEC, Take 2 tablets by mouth in the morning and at bedtime., Disp: , Rfl:    metoprolol tartrate (LOPRESSOR) 25 MG tablet, TAKE 1 TABLET BY MOUTH TWICE A DAY, Disp: 180 tablet, Rfl: 3   Multiple Vitamin (MULTIVITAMIN) tablet, Take 1 tablet by mouth daily., Disp: , Rfl:    omeprazole (PRILOSEC) 40 MG capsule, Take 40 mg by mouth as needed., Disp: , Rfl:    OPSUMIT 10 MG TABS, Take 10 mg by mouth daily., Disp: , Rfl: 8   Polyethyl Glycol-Propyl Glycol (SYSTANE OP), Place 1 drop into both eyes 3 (three) times daily as needed (dry/irritated eyes.)., Disp: , Rfl:    potassium chloride SA (KLOR-CON) 20 MEQ tablet, Take 1 tablet (20 mEq total) by mouth daily., Disp: , Rfl:    rivaroxaban (XARELTO) 20 MG TABS tablet, Take 20 mg by mouth daily with supper., Disp: , Rfl:    sildenafil (REVATIO) 20 MG tablet, Take 1-2 tablets (20-40 mg total) by mouth 3 (three) times daily. As of 07/03/20 taking 20 mg tid per [facility] pulm Dr. Harrietta Guardian, Disp: 10 tablet, Rfl: 0   spironolactone (ALDACTONE) 50 MG tablet, Take 1 tablet (50 mg total) by mouth daily., Disp: 90 tablet, Rfl: 3   torsemide (DEMADEX) 100 MG tablet, Take 100 mg by mouth daily. , Disp:  , Rfl:    treprostinil (REMODULIN) 20 MG/20ML injection, 20 ng/kg/min by Continuous infusion (non-IV)  route continuous., Disp: , Rfl:    triamcinolone cream (KENALOG) 0.1 %, Apply 1 application topically daily as needed (leg discoloration)., Disp: 454 g, Rfl: 2   Vitamin D, Ergocalciferol, (DRISDOL) 1.25 MG (50000 UNIT) CAPS capsule, Take 1 capsule (50,000 Units total) by mouth every 7 (seven) days., Disp: 5 capsule, Rfl: 0   albuterol (ACCUNEB) 1.25 MG/3ML nebulizer solution, Take 3 mLs (1.25 mg total) by nebulization every 6 (six) hours as needed for wheezing., Disp: 360 mL, Rfl: 12   chlorpheniramine-HYDROcodone (TUSSIONEX) 10-8 MG/5ML, Take 5 mLs by mouth every 12 (twelve) hours as needed for cough., Disp: 115 mL, Rfl: 0   tirzepatide (MOUNJARO) 7.5 MG/0.5ML Pen, Inject 7.5 mg into the skin once a week., Disp: 6 mL, Rfl: 0 No current facility-administered medications for this visit.  Facility-Administered Medications Ordered in Other Visits:    heparin lock flush 100 unit/mL, 500 Units, Intracatheter, Once PRN, Nicholas Lose, MD   sodium chloride flush (NS) 0.9 % injection 10 mL, 10 mL, Intracatheter, PRN, Nicholas Lose, MD   Allergies  Allergen Reactions   Ampicillin Other (See Comments)    Severe abdominal pain, dizziness (penicillin is okay)   Amoxicillin Other (See Comments)    LIGHT-HEADED and CLOSE TO "PASSING OUT"  AND ABDOMINAL PAIN Has patient had a PCN reaction causing immediate rash, facial/tongue/throat swelling, SOB or lightheadedness with hypotension: No Has patient had a PCN reaction causing severe rash involving mucus membranes or skin necrosis: No Has patient had a PCN reaction that required hospitalization No Has patient had a PCN reaction occurring within the last 10 years: No If all of the above answers are "NO", then may proceed with Cephalosporin use.   Indomethacin Other (See Comments)    Gastro irritation   Nsaids     GI ulcers    Metronidazole Nausea And  Vomiting   Sulfa Antibiotics Rash and Other (See Comments)    Very bad yeast infection     Review of Systems  Constitutional: Negative.   Respiratory: Negative.    Cardiovascular: Negative.   Gastrointestinal: Negative.   Neurological: Negative.   Psychiatric/Behavioral:  Positive for dysphoric mood.      Today's Vitals   04/30/22 1126  BP: 138/80  Pulse: 84  Temp: 98.5 F (36.9 C)  TempSrc: Oral  Weight: 245 lb (111.1 kg)  Height: '5\' 5"'$  (1.651 m)   Body mass index is 40.77 kg/m.   Objective:  Physical Exam Vitals reviewed.  Constitutional:      Appearance: She is well-developed.  HENT:     Head: Normocephalic and atraumatic.  Eyes:     Pupils: Pupils are equal, round, and reactive to light.  Cardiovascular:     Rate and Rhythm: Normal rate and regular rhythm.     Pulses: Normal pulses.     Heart sounds: Normal heart sounds. No murmur heard. Pulmonary:     Effort: Pulmonary effort is normal.     Breath sounds: Normal breath sounds.  Musculoskeletal:        General: Normal range of motion.  Skin:    General: Skin is warm and dry.     Capillary Refill: Capillary refill takes less than 2 seconds.  Neurological:     General: No focal deficit present.     Mental Status: She is alert and oriented to person, place, and time.     Cranial Nerves: No cranial nerve deficit.  Psychiatric:        Mood and Affect: Mood normal.  Assessment And Plan:     1. Hypertensive heart disease with congestive heart failure, unspecified heart failure type ([place]) Comments: Blood pressure is fairly controlled. Continue current medications and f/u with Cardiology  2. Type 2 diabetes mellitus with microalbuminuria, without long-term current use of insulin (HCC) - Hemoglobin A1c - Lipid panel  3. PAH (pulmonary artery hypertension) ([place]) Comments: Continue f/u with Cardiology  4. Acute cough Comments: Will treat with Tussionex, advised to avoid driving or operating heavy  machinery when taking medications.  5. Hypokalemia Comments: Has had low potassium previously will recheck levels. - Potassium  6. Hypomagnesemia Comments: Has had low magnesium previously will recheck levels. - Magnesium  7. Malignant neoplasm of upper-inner quadrant of left breast in female, estrogen receptor positive ([place])  8. Mood changes Comments: She has been having alot going on with her health will refer to therapist - Ambulatory referral to Psychology  9. Bronchospasm Comments: Sent accuneb to pharmacy. - albuterol (ACCUNEB) 1.25 MG/3ML nebulizer solution; Take 3 mLs (1.25 mg total) by nebulization every 6 (six) hours as needed for wheezing.  Dispense: 360 mL; Refill: 12  10. Chronic diastolic heart failure ([place])  11. Dependence on supplemental oxygen  12. Encounter for medical examination to establish care Patient is here to establish care. Went over patient medical, family, social and surgical history. Reviewed with patient their medications and any allergies  Reviewed with patient their sexual orientation, drug/tobacco and alcohol use Dicussed any new concerns with patient  recommended patient comes in for a physical exam and complete blood work.  Educated patient about the importance of annual screenings and immunizations.  Advised patient to eat a healthy diet along with exercise for atleast 30-45 min atleast 4-5 days of the week.      Patient was given opportunity to ask questions. Patient verbalized understanding of the plan and was able to repeat key elements of the plan. All questions were answered to their satisfaction.  Tiffany Brine, FNP   I, Tiffany Brine, FNP, have reviewed all documentation for this visit. The documentation on 04/30/22 for the exam, diagnosis, procedures, and orders are all accurate and complete.   IF YOU HAVE BEEN REFERRED TO A SPECIALIST, IT MAY TAKE 1-2 WEEKS TO SCHEDULE/PROCESS THE REFERRAL. IF YOU HAVE NOT HEARD FROM US/SPECIALIST  IN TWO WEEKS, PLEASE GIVE [place] A CALL AT [phone].   THE PATIENT IS ENCOURAGED TO PRACTICE SOCIAL DISTANCING DUE TO THE COVID-19 PANDEMIC.

## 2022-04-30 NOTE — Patient Instructions (Signed)

## 2022-05-01 LAB — HEMOGLOBIN A1C
Est. average glucose Bld gHb Est-mCnc: 105 mg/dL
Hgb A1c MFr Bld: 5.3 % (ref 4.8–5.6)

## 2022-05-01 LAB — LIPID PANEL
Chol/HDL Ratio: 4.2 ratio (ref 0.0–4.4)
Cholesterol, Total: 180 mg/dL (ref 100–199)
HDL: 43 mg/dL (ref >39)
LDL Chol Calc (NIH): 110 mg/dL — ABNORMAL HIGH (ref 0–99)
Triglycerides: 151 mg/dL — ABNORMAL HIGH (ref 0–149)
VLDL Cholesterol Cal: 27 mg/dL (ref 5–40)

## 2022-05-01 LAB — MAGNESIUM: Magnesium: 1.4 mg/dL — ABNORMAL LOW (ref 1.6–2.3)

## 2022-05-01 LAB — POTASSIUM: Potassium: 3.8 mmol/L (ref 3.5–5.2)

## 2022-05-05 ENCOUNTER — Ambulatory Visit (INDEPENDENT_AMBULATORY_CARE_PROVIDER_SITE_OTHER): Payer: 59 | Admitting: Physician Assistant

## 2022-05-05 ENCOUNTER — Encounter (INDEPENDENT_AMBULATORY_CARE_PROVIDER_SITE_OTHER): Payer: Self-pay | Admitting: Physician Assistant

## 2022-05-05 ENCOUNTER — Other Ambulatory Visit (HOSPITAL_COMMUNITY): Payer: Self-pay

## 2022-05-05 VITALS — BP 131/78 | HR 94 | Temp 98.3°F | Ht 65.0 in | Wt 239.6 lb

## 2022-05-05 DIAGNOSIS — R809 Proteinuria, unspecified: Secondary | ICD-10-CM | POA: Diagnosis not present

## 2022-05-05 DIAGNOSIS — Z6839 Body mass index (BMI) 39.0-39.9, adult: Secondary | ICD-10-CM

## 2022-05-05 DIAGNOSIS — E1129 Type 2 diabetes mellitus with other diabetic kidney complication: Secondary | ICD-10-CM | POA: Diagnosis not present

## 2022-05-05 DIAGNOSIS — Z7985 Long-term (current) use of injectable non-insulin antidiabetic drugs: Secondary | ICD-10-CM

## 2022-05-05 DIAGNOSIS — E669 Obesity, unspecified: Secondary | ICD-10-CM

## 2022-05-05 DIAGNOSIS — Z6837 Body mass index (BMI) 37.0-37.9, adult: Secondary | ICD-10-CM | POA: Insufficient documentation

## 2022-05-05 DIAGNOSIS — E559 Vitamin D deficiency, unspecified: Secondary | ICD-10-CM | POA: Diagnosis not present

## 2022-05-05 MED ORDER — TIRZEPATIDE 5 MG/0.5ML ~~LOC~~ SOAJ
5.0000 mg | SUBCUTANEOUS | 0 refills | Status: DC
  Filled 2022-05-05: qty 6, 84d supply, fill #0

## 2022-05-07 ENCOUNTER — Telehealth: Payer: Self-pay

## 2022-05-07 ENCOUNTER — Other Ambulatory Visit (HOSPITAL_COMMUNITY): Payer: Self-pay

## 2022-05-07 ENCOUNTER — Encounter: Payer: Self-pay | Admitting: Internal Medicine

## 2022-05-07 ENCOUNTER — Other Ambulatory Visit: Payer: Self-pay | Admitting: Nurse Practitioner

## 2022-05-07 DIAGNOSIS — R051 Acute cough: Secondary | ICD-10-CM

## 2022-05-07 MED ORDER — HYDROCOD POLI-CHLORPHE POLI ER 10-8 MG/5ML PO SUER
5.0000 mL | Freq: Two times a day (BID) | ORAL | 0 refills | Status: AC | PRN
  Filled 2022-05-07: qty 115, 12d supply, fill #0

## 2022-05-07 NOTE — Telephone Encounter (Signed)
Patient called to report [facility] does not have T[facility]ex in stock. She would like this sent to [facility]. [facility].   Please review, thanks!

## 2022-05-08 ENCOUNTER — Other Ambulatory Visit (HOSPITAL_COMMUNITY): Payer: Self-pay

## 2022-05-13 NOTE — Progress Notes (Signed)
Chief Complaint:   OBESITY [name] is here to discuss her progress with her obesity treatment plan along with follow-up of her obesity related diagnoses. [name] is on the Category 2 Plan and states she is following her eating plan approximately 85% of the time. [name] states she is class/home exercise 60 minutes 3 times per week.  Today's visit was #: 9 Starting weight: 241 lbs Starting date: 07/24/2021 Today's weight: 239 lbs Today's date: 05/05/2022 Total lbs lost to date: 2 lbs Total lbs lost since last in-office visit: 0  Interim History: [name] has done well with switch from Ozempic to [facility] for management of Type 2 diabetes. She reports a noticeable decrease in hunger and appetite following the switch. She has been focusing on getting in her protein first. She is not skipping meals.  She is exercising regularly, but has not been focusing on any strengthening and we discussed starting some strengthening exercises today.   Subjective:   1. Type 2 diabetes mellitus with microalbuminuria, without long-term current use of insulin ([place]) On 01/23/22, A1c was 5.6 at [facility].  Mounjaro 2.5 mg weekly.  Denies any side effects..  Notes appetite/increased satiety.  2. Vitamin D deficiency On ergocalciferol once a week.  Denies any side effects.  Level of 30.38 on 07/03/21.  Assessment/Plan:   1. Type 2 diabetes mellitus with microalbuminuria, without long-term current use of insulin (HCC) Continue/Refill Mounjaro 5 mg SQ once a week for 1 month with 0 refills.  Continue Prescribed Nutrition Plan and exercise to promote weight loss, and improve glycemic control.   -Refill tirzepatide (MOUNJARO) 5 MG/0.5ML Pen; Inject 5 mg into the skin once a week.  Dispense: 6 mL; Refill: 0  2. Vitamin D deficiency Continue ergocalciferol once weekly. Will plan to check vit D level at least 2-3 times yearly to avoid over supplementation.   3. BMI 39.0-39.9,adult, Current BMI 39.9  4. Obesity  (HCC)-start bmi 40.10 [name] is currently in the action stage of change. As such, her goal is to continue with weight loss efforts. She has agreed to the Category 2 Plan.   Exercise goals: As is.  Add gentle strengthening 10 minutes times 3 weeks.  Behavioral modification strategies: increasing lean protein intake, decreasing simple carbohydrates, increasing vegetables, increasing water intake, decreasing sodium intake, and no skipping meals.  [place] has agreed to follow-up with our clinic in 3 weeks. She was informed of the importance of frequent follow-up visits to maximize her success with intensive lifestyle modifications for her multiple health conditions.   Objective:   Blood pressure 131/78, pulse 94, temperature 98.3 F (36.8 C), height 5' 5"$  (1.651 m), weight 239 lb 9.6 oz (108.7 kg), last menstrual period 05/11/2013, SpO2 95 %. Body mass index is 39.87 kg/m. General: Cooperative, alert, well developed, in no acute distress. HEENT: Conjunctivae and lids unremarkable. Cardiovascular: Regular rhythm.  Lungs: Normal work of breathing. Neurologic: No focal deficits.   Lab Results  Component Value Date   CREATININE 1.01 (H) 10/23/2021   BUN 22 (H) 10/23/2021   NA 143 10/23/2021   K 3.8 04/30/2022   CL 106 10/23/2021   CO2 30 10/23/2021   Lab Results  Component Value Date   ALT 10 10/23/2021   AST 12 (L) 10/23/2021   ALKPHOS 81 10/23/2021   BILITOT 0.6 10/23/2021   Lab Results  Component Value Date   HGBA1C 5.3 04/30/2022   HGBA1C 5.5 07/03/2021   HGBA1C 5.5 10/25/2020   HGBA1C 5.5 03/05/2020  HGBA1C 5.0 10/05/2019   No results found for: "INSULIN" Lab Results  Component Value Date   TSH 0.82 07/03/2021   Lab Results  Component Value Date   CHOL 180 04/30/2022   HDL 43 04/30/2022   LDLCALC 110 (H) 04/30/2022   TRIG 151 (H) 04/30/2022   CHOLHDL 4.2 04/30/2022   Lab Results  Component Value Date   VD25OH 30.38 07/03/2021   VD25OH 60.62 10/25/2020    VD25OH 10.6 (L) 03/05/2020   Lab Results  Component Value Date   WBC 6.2 10/23/2021   HGB 12.1 10/23/2021   HCT 36.0 10/23/2021   MCV 90.5 10/23/2021   PLT 101 (L) 10/23/2021   Lab Results  Component Value Date   IRON 81 12/23/2020   TIBC 366 12/23/2020   FERRITIN 160 12/23/2020   Attestation Statements:   Reviewed by clinician on day of visit: allergies, medications, problem list, medical history, surgical history, family history, social history, and previous encounter notes.  I, Brendell Tyus, am acting as transcriptionist for [employer], PA.  I have reviewed the above documentation for accuracy and completeness, and I agree with the above. -  Karlos Scadden,PA-C

## 2022-05-26 ENCOUNTER — Encounter (INDEPENDENT_AMBULATORY_CARE_PROVIDER_SITE_OTHER): Payer: Self-pay | Admitting: Physician Assistant

## 2022-05-26 ENCOUNTER — Ambulatory Visit (INDEPENDENT_AMBULATORY_CARE_PROVIDER_SITE_OTHER): Payer: 59 | Admitting: Physician Assistant

## 2022-05-26 ENCOUNTER — Ambulatory Visit: Payer: 59

## 2022-05-26 ENCOUNTER — Other Ambulatory Visit (HOSPITAL_COMMUNITY): Payer: Self-pay

## 2022-05-26 ENCOUNTER — Other Ambulatory Visit: Payer: 59

## 2022-05-26 VITALS — BP 137/88 | HR 76 | Temp 98.5°F | Ht 65.0 in | Wt 243.8 lb

## 2022-05-26 DIAGNOSIS — R809 Proteinuria, unspecified: Secondary | ICD-10-CM

## 2022-05-26 DIAGNOSIS — E669 Obesity, unspecified: Secondary | ICD-10-CM

## 2022-05-26 DIAGNOSIS — F3289 Other specified depressive episodes: Secondary | ICD-10-CM

## 2022-05-26 DIAGNOSIS — E559 Vitamin D deficiency, unspecified: Secondary | ICD-10-CM

## 2022-05-26 DIAGNOSIS — E1129 Type 2 diabetes mellitus with other diabetic kidney complication: Secondary | ICD-10-CM

## 2022-05-26 DIAGNOSIS — F32A Depression, unspecified: Secondary | ICD-10-CM | POA: Insufficient documentation

## 2022-05-26 DIAGNOSIS — Z111 Encounter for screening for respiratory tuberculosis: Secondary | ICD-10-CM

## 2022-05-26 DIAGNOSIS — Z7985 Long-term (current) use of injectable non-insulin antidiabetic drugs: Secondary | ICD-10-CM

## 2022-05-26 DIAGNOSIS — Z6841 Body Mass Index (BMI) 40.0 and over, adult: Secondary | ICD-10-CM

## 2022-05-26 DIAGNOSIS — Z6839 Body mass index (BMI) 39.0-39.9, adult: Secondary | ICD-10-CM

## 2022-05-26 MED ORDER — TIRZEPATIDE 7.5 MG/0.5ML ~~LOC~~ SOAJ
7.5000 mg | SUBCUTANEOUS | 0 refills | Status: DC
  Filled 2022-05-26: qty 6, 84d supply, fill #0

## 2022-05-26 NOTE — Progress Notes (Signed)
Chief Complaint:   OBESITY [name] is here to discuss her progress with her obesity treatment plan along with follow-up of her obesity related diagnoses. [name] is on the Category 2 Plan and states she is following her eating plan approximately 85% of the time. [name] states she is exercising 60 minutes 3 times per week.  Today's visit was #: 10 Starting weight: 241 lbs Starting date: 07/24/2021 Today's weight: 243 lbs Today's date: 05/26/2022 Total lbs lost to date: 0 Total lbs lost since last in-office visit: +4 lbs  Interim History: [name] has done well with switch from Ozempic to [facility] for Type 2 diabetes.  She reports she had RSV infection following her last visit and was very sick for a couple of weeks with cough/aches, but is now improved and fatigue is resolving.  She reports some increased hunger and appetite especially at dinner meal. Breakfast- protein shake Lunch and Dinner- meat and vegetables.  Not skipping meals.   Subjective:   1. Type 2 diabetes mellitus with microalbuminuria, without long-term current use of insulin (HCC) A1C 5.6 01/23/22 at [place]. On Mounjaro 5 mg weekly without side effects.  Working on Camera operator to increase lean protein, decrease simple carbohydrates and exercise to promote weight loss and improve glycemic control.   2. Vitamin D deficiency Vitamin D Deficiency Vitamin D is not at goal of 50.  Most recent vitamin D level was 30.38. She is on  prescription ergocalciferol 50,000 IU weekly. Lab Results  Component Value Date   VD25OH 30.38 07/03/2021   VD25OH 60.62 10/25/2020   VD25OH 10.6 (L) 03/05/2020    Plan: Continue prescription vitamin D 50,000 IU weekly.  3. Obesity (HCC)-start bmi 40.10 4. BMI 40.0-44.9, adult (HCC) Current 40.6  Assessment/Plan:   1. Type 2 diabetes mellitus with microalbuminuria, without long-term current use of insulin (HCC) Increase/continue Mounjaro to 7.5 mg weekly. Continue nutrition plan and  exercise to promote weight loss and improve glycemic control.  - tirzepatide (MOUNJARO) 7.5 MG/0.5ML Pen; Inject 7.5 mg into the skin once a week.  Dispense: 6 mL; Refill: 0  2. Vitamin D deficiency Continue Ergocalciferol once weekly. Patient does not need refill. Check level at least 2-3 times yearly to avoid over supplementation.   3. Obesity (HCC)-start bmi 40.10  4. BMI 40.0-44.9, adult ([name]) Current 40.6   [name] is currently in the action stage of change. As such, her goal is to continue with weight loss efforts. She has agreed to the Category 2 Plan.   Exercise goals: All adults should avoid inactivity. Some physical activity is better than none, and adults who participate in any amount of physical activity gain some health benefits.  Behavioral modification strategies: increasing lean protein intake, decreasing simple carbohydrates, increasing water intake, no skipping meals, and planning for success.  [name] has agreed to follow-up with our clinic in 4 weeks. She was informed of the importance of frequent follow-up visits to maximize her success with intensive lifestyle modifications for her multiple health conditions.    Objective:   Blood pressure 137/88, pulse 76, temperature 98.5 F (36.9 C), height '5\' 5"'$  (1.651 m), weight 243 lb 12.8 oz (110.6 kg), last menstrual period 05/11/2013, SpO2 97 %. Body mass index is 40.57 kg/m.  General: Cooperative, alert, well developed, in no acute distress. HEENT: Conjunctivae and lids unremarkable. Cardiovascular: Regular rhythm.  Lungs: Normal work of breathing. Neurologic: No focal deficits.   Lab Results  Component Value Date   CREATININE 1.01 (H) 10/23/2021  BUN 22 (H) 10/23/2021   NA 143 10/23/2021   K 3.8 04/30/2022   CL 106 10/23/2021   CO2 30 10/23/2021   Lab Results  Component Value Date   ALT 10 10/23/2021   AST 12 (L) 10/23/2021   ALKPHOS 81 10/23/2021   BILITOT 0.6 10/23/2021   Lab Results  Component Value  Date   HGBA1C 5.3 04/30/2022   HGBA1C 5.5 07/03/2021   HGBA1C 5.5 10/25/2020   HGBA1C 5.5 03/05/2020   HGBA1C 5.0 10/05/2019   No results found for: "INSULIN" Lab Results  Component Value Date   TSH 0.82 07/03/2021   Lab Results  Component Value Date   CHOL 180 04/30/2022   HDL 43 04/30/2022   LDLCALC 110 (H) 04/30/2022   TRIG 151 (H) 04/30/2022   CHOLHDL 4.2 04/30/2022   Lab Results  Component Value Date   VD25OH 30.38 07/03/2021   VD25OH 60.62 10/25/2020   VD25OH 10.6 (L) 03/05/2020   Lab Results  Component Value Date   WBC 6.2 10/23/2021   HGB 12.1 10/23/2021   HCT 36.0 10/23/2021   MCV 90.5 10/23/2021   PLT 101 (L) 10/23/2021   Lab Results  Component Value Date   IRON 81 12/23/2020   TIBC 366 12/23/2020   FERRITIN 160 12/23/2020    Obesity Behavioral Intervention:   Approximately 15 minutes were spent on the discussion below.  ASK: We discussed the diagnosis of obesity with [name] today and [name] agreed to give [place] permission to discuss obesity behavioral modification therapy today.  ASSESS: [name] has the diagnosis of obesity and her BMI today is 40.6. [name] is in the action stage of change.   ADVISE: [name] was educated on the multiple health risks of obesity as well as the benefit of weight loss to improve her health. She was advised of the need for long term treatment and the importance of lifestyle modifications to improve her current health and to decrease her risk of future health problems.  AGREE: Multiple dietary modification options and treatment options were discussed and [name] agreed to follow the recommendations documented in the above note.  ARRANGE: [name] was educated on the importance of frequent visits to treat obesity as outlined per CMS and USPSTF guidelines and agreed to schedule her next follow up appointment today.  Attestation Statements:   Reviewed by clinician on day of visit: allergies, medications, problem list, medical  history, surgical history, family history, social history, and previous encounter notes.  Devony Mcgrady,PA-C

## 2022-05-29 LAB — QUANTIFERON-TB GOLD PLUS
QuantiFERON Mitogen Value: >10 IU/mL
QuantiFERON Nil Value: 0.04 IU/mL
QuantiFERON TB1 Ag Value: 0.05 IU/mL
QuantiFERON TB2 Ag Value: 0.03 IU/mL
QuantiFERON-TB Gold Plus: NEGATIVE

## 2022-06-11 ENCOUNTER — Other Ambulatory Visit (HOSPITAL_COMMUNITY): Payer: Self-pay

## 2022-06-18 ENCOUNTER — Other Ambulatory Visit (HOSPITAL_COMMUNITY): Payer: Self-pay

## 2022-06-18 ENCOUNTER — Ambulatory Visit (INDEPENDENT_AMBULATORY_CARE_PROVIDER_SITE_OTHER): Payer: 59 | Admitting: Physician Assistant

## 2022-06-18 ENCOUNTER — Encounter: Payer: Self-pay | Admitting: Internal Medicine

## 2022-06-18 ENCOUNTER — Encounter (INDEPENDENT_AMBULATORY_CARE_PROVIDER_SITE_OTHER): Payer: Self-pay | Admitting: Physician Assistant

## 2022-06-18 VITALS — BP 128/82 | HR 99 | Temp 98.3°F | Ht 65.0 in | Wt 237.0 lb

## 2022-06-18 DIAGNOSIS — R809 Proteinuria, unspecified: Secondary | ICD-10-CM

## 2022-06-18 DIAGNOSIS — Z7985 Long-term (current) use of injectable non-insulin antidiabetic drugs: Secondary | ICD-10-CM

## 2022-06-18 DIAGNOSIS — E1129 Type 2 diabetes mellitus with other diabetic kidney complication: Secondary | ICD-10-CM

## 2022-06-18 DIAGNOSIS — Z6839 Body mass index (BMI) 39.0-39.9, adult: Secondary | ICD-10-CM

## 2022-06-18 DIAGNOSIS — E669 Obesity, unspecified: Secondary | ICD-10-CM | POA: Diagnosis not present

## 2022-06-18 DIAGNOSIS — E559 Vitamin D deficiency, unspecified: Secondary | ICD-10-CM | POA: Diagnosis not present

## 2022-06-18 MED ORDER — TIRZEPATIDE 7.5 MG/0.5ML ~~LOC~~ SOAJ
7.5000 mg | SUBCUTANEOUS | 0 refills | Status: DC
  Filled 2022-06-18: qty 6, 84d supply, fill #0

## 2022-06-18 MED ORDER — VITAMIN D (ERGOCALCIFEROL) 1.25 MG (50000 UNIT) PO CAPS
50000.0000 [IU] | ORAL_CAPSULE | ORAL | 0 refills | Status: DC
  Filled 2022-06-18: qty 5, 35d supply, fill #0

## 2022-06-18 NOTE — Assessment & Plan Note (Addendum)
Vitamin D Deficiency Vitamin D is not at goal of 50.  Most recent vitamin D level was 30.38. She is on  prescription ergocalciferol 50,000 IU weekly. No side effects with Ergocalciferol  Lab Results  Component Value Date   VD25OH 30.38 07/03/2021   VD25OH 60.62 10/25/2020   VD25OH 10.6 (L) 03/05/2020    Plan: Refill prescription vitamin D 50,000 IU weekly. She sees her PCP next month and will have labs done at that time.

## 2022-06-18 NOTE — Progress Notes (Signed)
Office: [phone]  /  Fax: [phone]  WEIGHT SUMMARY AND BIOMETRICS  Vitals Temp: 98.3 F (36.8 C) BP: 128/82 Pulse Rate: 99 SpO2: 95 %   Anthropometric Measurements Height: 5\' 5"  (1.651 m) Weight: 237 lb (107.5 kg) BMI (Calculated): 39.44 Weight at Last Visit: 243 lb Weight Lost Since Last Visit: 6 lb Weight Gained Since Last Visit: 0 lb Starting Weight: 241 lb   Body Composition  Body Fat %: 48.5 % Fat Mass (lbs): 115.2 lbs Muscle Mass (lbs): 116.2 lbs Total Body Water (lbs): 81 lbs Visceral Fat Rating : 15   Other Clinical Data Fasting: No Labs: No Today's Visit #: 11 Starting Date: 07/24/21     HPI  Chief Complaint: OBESITY  [name] is here to discuss her progress with her obesity treatment plan. She is on the the Category 2 Plan and states she is following her eating plan approximately 85 % of the time. She states she is exercising 60 minutes 3 times per week.   Interval History:  Since last office visit she has done well with weight loss.  She is down 6 lbs.  She is doing weight training with silver sneakers as her shoulder is much better following a steroid injection.  Hunger/appetite are controlled overall.  Snacking sometimes on salty foods- taking diuretic sporadically as does not think she needs daily now.  Advised to let her providers know of medication changes.   Plans to see brother for [name] over the next few weeks.    Pharmacotherapy: Mounjaro 5 mg weekly. No N/V/D/Constipation. No neck mass or difficultly swallowing. Mood stable.   PHYSICAL EXAM:  Blood pressure 128/82, pulse 99, temperature 98.3 F (36.8 C), height 5\' 5"  (1.651 m), weight 237 lb (107.5 kg), last menstrual period 05/11/2013, SpO2 95 %. Body mass index is 39.44 kg/m.  General: She is overweight, cooperative, alert, well developed, and in no acute distress. PSYCH: Has normal mood, affect and thought process.   Lungs: Normal breathing effort, no  conversational dyspnea.  DIAGNOSTIC DATA REVIEWED:  BMET    Component Value Date/Time   NA 143 10/23/2021 1150   NA 143 03/05/2020 1409   NA 139 03/31/2017 1332   K 3.8 04/30/2022 1230   K 3.6 03/31/2017 1332   CL 106 10/23/2021 1150   CO2 30 10/23/2021 1150   CO2 26 03/31/2017 1332   GLUCOSE 110 (H) 10/23/2021 1150   GLUCOSE 129 03/31/2017 1332   BUN 22 (H) 10/23/2021 1150   BUN 18 03/05/2020 1409   BUN 18.1 03/31/2017 1332   CREATININE 1.01 (H) 10/23/2021 1150   CREATININE 1.0 03/31/2017 1332   CALCIUM 9.4 10/23/2021 1150   CALCIUM 9.2 03/31/2017 1332   GFRNONAA >60 10/23/2021 1150   GFRAA 83 03/05/2020 1409   Lab Results  Component Value Date   HGBA1C 5.3 04/30/2022   HGBA1C 6.2 (H) 06/09/2013   No results found for: "INSULIN" Lab Results  Component Value Date   TSH 0.82 07/03/2021   CBC    Component Value Date/Time   WBC 6.2 10/23/2021 1150   WBC 6.2 07/03/2021 1138   RBC 3.98 10/23/2021 1150   HGB 12.1 10/23/2021 1150   HGB 11.0 (L) 11/06/2020 1406   HGB 13.4 03/31/2017 1332   HCT 36.0 10/23/2021 1150   HCT 32.7 (L) 11/06/2020 1406   HCT 39.7 03/31/2017 1332   PLT 101 (L) 10/23/2021 1150   PLT 94 (LL) 11/06/2020 1406   MCV 90.5 10/23/2021 1150   MCV  92 11/06/2020 1406   MCV 99.3 03/31/2017 1332   MCH 30.4 10/23/2021 1150   MCHC 33.6 10/23/2021 1150   RDW 13.1 10/23/2021 1150   RDW 13.1 11/06/2020 1406   RDW 14.0 03/31/2017 1332   Iron Studies    Component Value Date/Time   IRON 81 12/23/2020 1045   IRON 33 (L) 06/14/2013 1141   TIBC 366 12/23/2020 1045   TIBC 441 06/14/2013 1141   FERRITIN 160 12/23/2020 1045   FERRITIN 88 06/14/2013 1141   IRONPCTSAT 22 12/23/2020 1045   IRONPCTSAT 16 08/30/2017 1648   Lipid Panel     Component Value Date/Time   CHOL 180 04/30/2022 1230   TRIG 151 (H) 04/30/2022 1230   HDL 43 04/30/2022 1230   CHOLHDL 4.2 04/30/2022 1230   CHOLHDL 4 07/03/2021 1138   VLDL 23.6 07/03/2021 1138   LDLCALC 110 (H)  04/30/2022 1230   Hepatic Function Panel     Component Value Date/Time   PROT 7.7 10/23/2021 1150   PROT 7.6 03/05/2020 1409   PROT 7.8 03/31/2017 1332   ALBUMIN 4.1 10/23/2021 1150   ALBUMIN 4.3 03/05/2020 1409   ALBUMIN 3.5 03/31/2017 1332   AST 12 (L) 10/23/2021 1150   AST 16 03/31/2017 1332   ALT 10 10/23/2021 1150   ALT 14 03/31/2017 1332   ALKPHOS 81 10/23/2021 1150   ALKPHOS 96 03/31/2017 1332   BILITOT 0.6 10/23/2021 1150   BILITOT 0.97 03/31/2017 1332   BILIDIR 0.2 12/31/2015 1315      Component Value Date/Time   TSH 0.82 07/03/2021 1138   Nutritional Lab Results  Component Value Date   VD25OH 30.38 07/03/2021   VD25OH 60.62 10/25/2020   VD25OH 10.6 (L) 03/05/2020    ASSOCIATED CONDITIONS ADDRESSED TODAY  ASSESSMENT AND PLAN  Problem List Items Addressed This Visit     Type 2 diabetes mellitus with microalbuminuria, without long-term current use of insulin (HCC) - Primary    Type 2 Diabetes Mellitus with other specified complication, without long-term current use of insulin HgbA1c is at goal. Last A1c was 5.3  Medication(s): Mounjaro 5.0 mg SQ weekly Reports no side effects with Mounjaro.   Lab Results  Component Value Date   HGBA1C 5.3 04/30/2022   HGBA1C 5.5 07/03/2021   HGBA1C 5.5 10/25/2020   Lab Results  Component Value Date   MICROALBUR 6.4 07/03/2021   LDLCALC 110 (H) 04/30/2022   CREATININE 1.01 (H) 10/23/2021   Lab Results  Component Value Date   GFR 66.99 10/25/2020   GFR 46.84 (L) 01/11/2019   GFR 59.76 (L) 12/22/2018   Plan: Continue and increase dose Mounjaro 7.5 mg SQ weekly Continue working on nutrition plan to decrease simple carbohydrates, increase lean proteins and exercise to promote weight loss, and improve glycemic control.         Relevant Medications   tirzepatide (MOUNJARO) 7.5 MG/0.5ML Pen   Vitamin D deficiency    Vitamin D Deficiency Vitamin D is not at goal of 50.  Most recent vitamin D level was  30.38. She is on  prescription ergocalciferol 50,000 IU weekly. No side effects with Ergocalciferol  Lab Results  Component Value Date   VD25OH 30.38 07/03/2021   VD25OH 60.62 10/25/2020   VD25OH 10.6 (L) 03/05/2020   Plan: Refill prescription vitamin D 50,000 IU weekly. She sees her PCP next month and will have labs done at that time.        Relevant Medications   Vitamin D, Ergocalciferol, (DRISDOL) 1.25  MG (50000 UNIT) CAPS capsule   BMI 39.0-39.9,adult, Current BMI 39.9   Obesity (HCC)-start bmi 40.10   Relevant Medications   tirzepatide (MOUNJARO) 7.5 MG/0.5ML Pen      TREATMENT PLAN FOR OBESITY:  Recommended Dietary Goals  Adorabella is currently in the action stage of change. As such, her goal is to continue weight management plan. She has agreed to the Category 2 Plan.  Behavioral Intervention  We discussed the following Behavioral Modification Strategies today: increasing lean protein intake, decreasing simple carbohydrates , increasing vegetables, increasing water intake, work on meal planning and easy cooking plans, planning for success, and keeping healthy foods at home.  Additional resources provided today: NA  Recommended Physical Activity Goals  Marisleysis has been advised to work up to 150 minutes of moderate intensity aerobic activity a week and strengthening exercises 2-3 times per week for cardiovascular health, weight loss maintenance and preservation of muscle mass.   She has agreed to Continue current level of physical activity    Pharmacotherapy We discussed various medication options to help [facility] with her weight loss efforts and we both agreed to increase Mounjaro to 7.5 mg weekly.    Return in about 3 weeks (around 07/09/2022).Marland Kitchen She was informed of the importance of frequent follow up visits to maximize her success with intensive lifestyle modifications for her multiple health conditions.   ATTESTASTION STATEMENTS:  Reviewed by clinician on day of  visit: allergies, medications, problem list, medical history, surgical history, family history, social history, and previous encounter notes.   I have personally spent 34 minutes total time today in preparation, patient care, nutritional counseling and documentation for this visit, including the following: review of clinical lab tests; review of medical tests/procedures/services.      Tiffany Jankowiak, PA-C

## 2022-06-18 NOTE — Assessment & Plan Note (Signed)
Type 2 Diabetes Mellitus with other specified complication, without long-term current use of insulin HgbA1c is at goal. Last A1c was 5.3  Medication(s): Mounjaro 5.0 mg SQ weekly Reports no side effects with Mounjaro.   Lab Results  Component Value Date   HGBA1C 5.3 04/30/2022   HGBA1C 5.5 07/03/2021   HGBA1C 5.5 10/25/2020   Lab Results  Component Value Date   MICROALBUR 6.4 07/03/2021   LDLCALC 110 (H) 04/30/2022   CREATININE 1.01 (H) 10/23/2021   Lab Results  Component Value Date   GFR 66.99 10/25/2020   GFR 46.84 (L) 01/11/2019   GFR 59.76 (L) 12/22/2018    Plan: Continue and increase dose Mounjaro 7.5 mg SQ weekly Continue working on nutrition plan to decrease simple carbohydrates, increase lean proteins and exercise to promote weight loss, and improve glycemic control.

## 2022-07-10 ENCOUNTER — Encounter: Payer: Self-pay | Admitting: Pharmacist

## 2022-07-10 NOTE — Progress Notes (Signed)
[facility] Customer service manager [facility] Quality Pharmacy Team Statin Quality Measure Assessment   07/10/2022  [name] 05/06/1965 [id number]  Per review of chart and payor information, [name] has a diagnosis of diabetes but is not currently filling a statin prescription.  This places patient into the Statin Use In Patients with Diabetes (SUPD) measure for CMS.    Patient has documented trials of pravastatin with no documented issues.  Her 10-year ASCVD risk score (Arnett DK, et al., 2019) is: 13.7%.  If deemed clinically appropriate, please evaluate her for statin therapy at [facility] office visit.    Please consider ONE of the following recommendations:  Initiate moderate intensity statin Atorvastatin 10 mg once daily, #90, 3 refills   Rosuvastatin 5 mg once daily, #90, 3 refills    Initiate low intensity          statin with reduced frequency if prior          statin intolerance 1x weekly, #13, 3 refills   2x weekly, #26, 3 refills   3x weekly, #39, 3 refills    Code for past statin intolerance or  other exclusions (required annually)  Provider Requirements: Associate code during an office visit or telehealth encounter  Drug Induced Myopathy G72.0   Myopathy, unspecified G72.9   Myositis, unspecified M60.9   Rhabdomyolysis M62.82   Cirrhosis of liver K74.69   Prediabetes R73.03   PCOS E28.2   Thank you for allowing [facility] pharmacy to be a part of this patient's care. Dellie Burns, PharmD [facility]  [facility] Clinical Pharmacist Office: [phone]

## 2022-07-13 ENCOUNTER — Other Ambulatory Visit (HOSPITAL_COMMUNITY): Payer: Self-pay

## 2022-07-13 ENCOUNTER — Encounter (INDEPENDENT_AMBULATORY_CARE_PROVIDER_SITE_OTHER): Payer: Self-pay | Admitting: Physician Assistant

## 2022-07-13 ENCOUNTER — Ambulatory Visit (INDEPENDENT_AMBULATORY_CARE_PROVIDER_SITE_OTHER): Payer: 59 | Admitting: Physician Assistant

## 2022-07-13 VITALS — BP 149/90 | HR 89 | Temp 98.2°F | Ht 65.0 in | Wt 238.0 lb

## 2022-07-13 DIAGNOSIS — R809 Proteinuria, unspecified: Secondary | ICD-10-CM | POA: Diagnosis not present

## 2022-07-13 DIAGNOSIS — E1129 Type 2 diabetes mellitus with other diabetic kidney complication: Secondary | ICD-10-CM | POA: Diagnosis not present

## 2022-07-13 DIAGNOSIS — Z6839 Body mass index (BMI) 39.0-39.9, adult: Secondary | ICD-10-CM

## 2022-07-13 DIAGNOSIS — E559 Vitamin D deficiency, unspecified: Secondary | ICD-10-CM

## 2022-07-13 DIAGNOSIS — Z7985 Long-term (current) use of injectable non-insulin antidiabetic drugs: Secondary | ICD-10-CM

## 2022-07-13 DIAGNOSIS — E876 Hypokalemia: Secondary | ICD-10-CM | POA: Diagnosis not present

## 2022-07-13 DIAGNOSIS — E669 Obesity, unspecified: Secondary | ICD-10-CM

## 2022-07-13 MED ORDER — TIRZEPATIDE 10 MG/0.5ML ~~LOC~~ SOAJ
10.0000 mg | SUBCUTANEOUS | 0 refills | Status: DC
  Filled 2022-07-13: qty 2, 28d supply, fill #0

## 2022-07-13 NOTE — Assessment & Plan Note (Signed)
Type 2 Diabetes Mellitus with other specified complication, without long-term current use of insulin HgbA1c is at goal. Last A1c was 5.3  Medication(s): Mounjaro 7.5 mg SQ weekly No side effects with Mounjaro. No N/V/Diarrhea or constipation. No neck mass, no difficulty swallowing. Mood is stable.   Lab Results  Component Value Date   HGBA1C 5.3 04/30/2022   HGBA1C 5.5 07/03/2021   HGBA1C 5.5 10/25/2020   Lab Results  Component Value Date   MICROALBUR 6.4 07/03/2021   LDLCALC 110 (H) 04/30/2022   CREATININE 1.01 (H) 10/23/2021   Lab Results  Component Value Date   GFR 66.99 10/25/2020   GFR 46.84 (L) 01/11/2019   GFR 59.76 (L) 12/22/2018    Plan: Continue and increase dose Mounjaro 10 mg SQ weekly Continue working on nutrition plan to decrease simple carbohydrates, increase lean proteins and exercise to promote weight loss, and improve glycemic control.

## 2022-07-13 NOTE — Progress Notes (Signed)
Office: [phone]  /  Fax: [phone]  WEIGHT SUMMARY AND BIOMETRICS  Vitals Temp: 98.2 F (36.8 C) BP: (!) 149/90 Pulse Rate: 89 SpO2: 95 %   Anthropometric Measurements Height: 5\' 5"  (1.651 m) Weight: 238 lb (108 kg) BMI (Calculated): 39.61 Weight at Last Visit: 237 lb Weight Lost Since Last Visit: 0 lb Weight Gained Since Last Visit: 1 lb Starting Weight: 241 lb Total Weight Loss (lbs): 10 lb (4.536 kg)   Body Composition  Body Fat %: 49.8 % Fat Mass (lbs): 118.6 lbs Muscle Mass (lbs): 113.4 lbs Total Body Water (lbs): 82.8 lbs Visceral Fat Rating : 15   Other Clinical Data Fasting: no Labs: no Today's Visit #: 12 Starting Date: 07/24/21     HPI  Chief Complaint: OBESITY  [name] is here to discuss her progress with her obesity treatment plan. She is on the the Category 2 Plan and states she is following her eating plan approximately 85 % of the time. She states she is exercising/gym 60 minutes 3 times per week.   Interval History:  Since last office visit she is up 1 lb. Follow up with Pulmonary at [facility] and medications adjusted for PAH.  Notes from visit Pulmonary [facility]  on 07/10/22: I will put thru paperwork for SOTATERCEPT. Increase SPIRONOLACTONE to 50 mg once a day (should be 2 of the 25 mg tablets that you have at home, and I have also sent in new 50 mg tablets. Try going back to the 20 mg tablets of TORSEMIDE, taking 1-2 per day depending on swelling. Call echocardiogram to reschedule the echo with our next visit [phone]). I will put in orders for [facility] labs while starting the SOTATERCEPT (need a CBC between each of the first 5 injections)   Hunger/appetite moderately controlled on Mounjaro.  Exercise- Silver sneakers 60 mins. 3 times weekly as usual.     Pharmacotherapy: Mounjaro 7.5 mg weekly. No side effects with Mounjaro. Not skipping meals.   PHYSICAL EXAM:  Blood pressure (!) 149/90, pulse 89, temperature 98.2 F (36.8 C),  height 5\' 5"  (1.651 m), weight 238 lb (108 kg), last menstrual period 05/11/2013, SpO2 95 %. Body mass index is 39.61 kg/m.  General: She is overweight, cooperative, alert, well developed, and in no acute distress. PSYCH: Has normal mood, affect and thought process.   Cardiovascular: regular, HR 89 Lungs: Normal breathing effort, no conversational dyspnea. On usual oxygen by nasal cannula Neuro; No focal deficits  DIAGNOSTIC DATA REVIEWED:  BMET    Component Value Date/Time   NA 143 10/23/2021 1150   NA 143 03/05/2020 1409   NA 139 03/31/2017 1332   K 3.8 04/30/2022 1230   K 3.6 03/31/2017 1332   CL 106 10/23/2021 1150   CO2 30 10/23/2021 1150   CO2 26 03/31/2017 1332   GLUCOSE 110 (H) 10/23/2021 1150   GLUCOSE 129 03/31/2017 1332   BUN 22 (H) 10/23/2021 1150   BUN 18 03/05/2020 1409   BUN 18.1 03/31/2017 1332   CREATININE 1.01 (H) 10/23/2021 1150   CREATININE 1.0 03/31/2017 1332   CALCIUM 9.4 10/23/2021 1150   CALCIUM 9.2 03/31/2017 1332   GFRNONAA >60 10/23/2021 1150   GFRAA 83 03/05/2020 1409   Lab Results  Component Value Date   HGBA1C 5.3 04/30/2022   HGBA1C 6.2 (H) 06/09/2013   No results found for: "INSULIN" Lab Results  Component Value Date   TSH 0.82 07/03/2021   CBC    Component Value Date/Time   WBC  6.2 10/23/2021 1150   WBC 6.2 07/03/2021 1138   RBC 3.98 10/23/2021 1150   HGB 12.1 10/23/2021 1150   HGB 11.0 (L) 11/06/2020 1406   HGB 13.4 03/31/2017 1332   HCT 36.0 10/23/2021 1150   HCT 32.7 (L) 11/06/2020 1406   HCT 39.7 03/31/2017 1332   PLT 101 (L) 10/23/2021 1150   PLT 94 (LL) 11/06/2020 1406   MCV 90.5 10/23/2021 1150   MCV 92 11/06/2020 1406   MCV 99.3 03/31/2017 1332   MCH 30.4 10/23/2021 1150   MCHC 33.6 10/23/2021 1150   RDW 13.1 10/23/2021 1150   RDW 13.1 11/06/2020 1406   RDW 14.0 03/31/2017 1332   Iron Studies    Component Value Date/Time   IRON 81 12/23/2020 1045   IRON 33 (L) 06/14/2013 1141   TIBC 366 12/23/2020 1045    TIBC 441 06/14/2013 1141   FERRITIN 160 12/23/2020 1045   FERRITIN 88 06/14/2013 1141   IRONPCTSAT 22 12/23/2020 1045   IRONPCTSAT 16 08/30/2017 1648   Lipid Panel     Component Value Date/Time   CHOL 180 04/30/2022 1230   TRIG 151 (H) 04/30/2022 1230   HDL 43 04/30/2022 1230   CHOLHDL 4.2 04/30/2022 1230   CHOLHDL 4 07/03/2021 1138   VLDL 23.6 07/03/2021 1138   LDLCALC 110 (H) 04/30/2022 1230   Hepatic Function Panel     Component Value Date/Time   PROT 7.7 10/23/2021 1150   PROT 7.6 03/05/2020 1409   PROT 7.8 03/31/2017 1332   ALBUMIN 4.1 10/23/2021 1150   ALBUMIN 4.3 03/05/2020 1409   ALBUMIN 3.5 03/31/2017 1332   AST 12 (L) 10/23/2021 1150   AST 16 03/31/2017 1332   ALT 10 10/23/2021 1150   ALT 14 03/31/2017 1332   ALKPHOS 81 10/23/2021 1150   ALKPHOS 96 03/31/2017 1332   BILITOT 0.6 10/23/2021 1150   BILITOT 0.97 03/31/2017 1332   BILIDIR 0.2 12/31/2015 1315      Component Value Date/Time   TSH 0.82 07/03/2021 1138   Nutritional Lab Results  Component Value Date   VD25OH 30.38 07/03/2021   VD25OH 60.62 10/25/2020   VD25OH 10.6 (L) 03/05/2020    ASSOCIATED CONDITIONS ADDRESSED TODAY  ASSESSMENT AND PLAN  Problem List Items Addressed This Visit     Type 2 diabetes mellitus with microalbuminuria, without long-term current use of insulin - Primary    Type 2 Diabetes Mellitus with other specified complication, without long-term current use of insulin HgbA1c is at goal. Last A1c was 5.3  Medication(s): Mounjaro 7.5 mg SQ weekly No side effects with Mounjaro. No N/V/Diarrhea or constipation. No neck mass, no difficulty swallowing. Mood is stable.   Lab Results  Component Value Date   HGBA1C 5.3 04/30/2022   HGBA1C 5.5 07/03/2021   HGBA1C 5.5 10/25/2020   Lab Results  Component Value Date   MICROALBUR 6.4 07/03/2021   LDLCALC 110 (H) 04/30/2022   CREATININE 1.01 (H) 10/23/2021   Lab Results  Component Value Date   GFR 66.99 10/25/2020   GFR  46.84 (L) 01/11/2019   GFR 59.76 (L) 12/22/2018   Plan: Continue and increase dose Mounjaro 10 mg SQ weekly Continue working on nutrition plan to decrease simple carbohydrates, increase lean proteins and exercise to promote weight loss, and improve glycemic control.          Relevant Medications   tirzepatide (MOUNJARO) 10 MG/0.5ML Pen   Other Relevant Orders   CMP14+EGFR   Vitamin D deficiency  Vitamin D Deficiency Vitamin D is not at goal of 50.  Most recent vitamin D level was 30.38. She is on  prescription ergocalciferol 50,000 IU weekly. Lab Results  Component Value Date   VD25OH 30.38 07/03/2021   VD25OH 60.62 10/25/2020   VD25OH 10.6 (L) 03/05/2020   Plan: Continue  prescription ergocalciferol 50,000 IU weekly Recheck Vit D level today.  Low vitamin D levels can be associated with adiposity and may result in leptin resistance and weight gain. Also associated with fatigue. Currently on vitamin D supplementation without any adverse effects.         Relevant Orders   VITAMIN D 25 Hydroxy (Vit-D Deficiency, Fractures)   BMI 39.0-39.9,adult, Current BMI 39.9   Obesity (HCC)-start bmi 40.10   Relevant Medications   tirzepatide (MOUNJARO) 10 MG/0.5ML Pen   Low serum potassium    History of CKD and low K levels in past. Reports cramping frequently when taking diuretics as previously prescribed. Diuretics adjusted by pulmonary at visit 07/10/22, but unable to obtain a potassium level at last visit.  Plan: Recheck potassium, CMET today.  Continue diuretics per pulmonary. Will follow with pulmonary.          TREATMENT PLAN FOR OBESITY:  Recommended Dietary Goals  [name] is currently in the action stage of change. As such, her goal is to continue weight management plan. She has agreed to the Category 2 Plan.  Behavioral Intervention  We discussed the following Behavioral Modification Strategies today: increasing lean protein intake, decreasing simple  carbohydrates , increasing vegetables, avoiding skipping meals, work on meal planning and preparation, and planning for success.  Additional resources provided today: NA  Recommended Physical Activity Goals  [name] has been advised to work up to 150 minutes of moderate intensity aerobic activity a week and strengthening exercises 2-3 times per week for cardiovascular health, weight loss maintenance and preservation of muscle mass.   She has agreed to Continue current level of physical activity    Pharmacotherapy We discussed various medication options to help [facility] with her weight loss efforts and we both agreed to increase Mounjaro to 10 mg weekly for Type 2 diabetes and continue to work on nutritional and behavioral strategies to promote weight loss.    Return in about 4 weeks (around 08/10/2022).Marland Kitchen She was informed of the importance of frequent follow up visits to maximize her success with intensive lifestyle modifications for her multiple health conditions.   ATTESTASTION STATEMENTS:  Reviewed by clinician on day of visit: allergies, medications, problem list, medical history, surgical history, family history, social history, and previous encounter notes.   I have personally spent 43 minutes total time today in preparation, patient care, nutritional counseling and documentation for this visit, including the following: review of clinical lab tests; review of medical tests/procedures/services.      Tiffany Jenson, PA-C

## 2022-07-13 NOTE — Assessment & Plan Note (Signed)
Vitamin D Deficiency Vitamin D is not at goal of 50.  Most recent vitamin D level was 30.38. She is on  prescription ergocalciferol 50,000 IU weekly. Lab Results  Component Value Date   VD25OH 30.38 07/03/2021   VD25OH 60.62 10/25/2020   VD25OH 10.6 (L) 03/05/2020    Plan: Continue  prescription ergocalciferol 50,000 IU weekly Recheck Vit D level today.  Low vitamin D levels can be associated with adiposity and may result in leptin resistance and weight gain. Also associated with fatigue. Currently on vitamin D supplementation without any adverse effects.

## 2022-07-13 NOTE — Assessment & Plan Note (Signed)
History of CKD and low K levels in past. Reports cramping frequently when taking diuretics as previously prescribed. Diuretics adjusted by pulmonary at visit 07/10/22, but unable to obtain a potassium level at last visit.  Plan: Recheck potassium, CMET today.  Continue diuretics per pulmonary. Will follow with pulmonary.

## 2022-07-14 ENCOUNTER — Other Ambulatory Visit (HOSPITAL_COMMUNITY): Payer: Self-pay

## 2022-07-14 LAB — CMP14+EGFR
ALT: 8 IU/L (ref 0–32)
AST: 14 IU/L (ref 0–40)
Albumin/Globulin Ratio: 1.2 (ref 1.2–2.2)
Albumin: 3.8 g/dL (ref 3.8–4.9)
Alkaline Phosphatase: 90 IU/L (ref 44–121)
BUN/Creatinine Ratio: 17 (ref 9–23)
BUN: 15 mg/dL (ref 6–24)
Bilirubin Total: 0.5 mg/dL (ref 0.0–1.2)
CO2: 23 mmol/L (ref 20–29)
Calcium: 9.3 mg/dL (ref 8.7–10.2)
Chloride: 104 mmol/L (ref 96–106)
Creatinine, Ser: 0.86 mg/dL (ref 0.57–1.00)
Globulin, Total: 3.1 g/dL (ref 1.5–4.5)
Glucose: 127 mg/dL — ABNORMAL HIGH (ref 70–99)
Potassium: 3.7 mmol/L (ref 3.5–5.2)
Sodium: 143 mmol/L (ref 134–144)
Total Protein: 6.9 g/dL (ref 6.0–8.5)
eGFR: 79 mL/min/{1.73_m2} (ref >59)

## 2022-07-14 LAB — VITAMIN D 25 HYDROXY (VIT D DEFICIENCY, FRACTURES): Vit D, 25-Hydroxy: 43 ng/mL (ref 30.0–100.0)

## 2022-07-22 ENCOUNTER — Telehealth (INDEPENDENT_AMBULATORY_CARE_PROVIDER_SITE_OTHER): Payer: Self-pay | Admitting: Physician Assistant

## 2022-07-22 NOTE — Telephone Encounter (Signed)
Patient needs more Mounjaro . Pt states that she only received one box of Mounjaro after her last appt, when normally she received three boxes. Please call pt. at number on file.

## 2022-07-22 NOTE — Telephone Encounter (Signed)
I called and spoke with patient to inform her that Shawn had sent in 1 months worth of Mounjaro.  Patient was ok with the one months worth.  I informed patient that she could discuss a 90 day refill during her next visit  here and that it also depends on her insurance carrier.

## 2022-08-06 ENCOUNTER — Encounter (INDEPENDENT_AMBULATORY_CARE_PROVIDER_SITE_OTHER): Payer: Self-pay | Admitting: Physician Assistant

## 2022-08-06 ENCOUNTER — Other Ambulatory Visit (HOSPITAL_COMMUNITY): Payer: Self-pay

## 2022-08-06 ENCOUNTER — Ambulatory Visit (INDEPENDENT_AMBULATORY_CARE_PROVIDER_SITE_OTHER): Payer: 59 | Admitting: Physician Assistant

## 2022-08-06 VITALS — BP 144/80 | HR 85 | Temp 98.2°F | Ht 65.0 in | Wt 234.0 lb

## 2022-08-06 DIAGNOSIS — E559 Vitamin D deficiency, unspecified: Secondary | ICD-10-CM | POA: Diagnosis not present

## 2022-08-06 DIAGNOSIS — Z6838 Body mass index (BMI) 38.0-38.9, adult: Secondary | ICD-10-CM

## 2022-08-06 DIAGNOSIS — E669 Obesity, unspecified: Secondary | ICD-10-CM | POA: Diagnosis not present

## 2022-08-06 DIAGNOSIS — R809 Proteinuria, unspecified: Secondary | ICD-10-CM | POA: Diagnosis not present

## 2022-08-06 DIAGNOSIS — Z6839 Body mass index (BMI) 39.0-39.9, adult: Secondary | ICD-10-CM

## 2022-08-06 DIAGNOSIS — E1129 Type 2 diabetes mellitus with other diabetic kidney complication: Secondary | ICD-10-CM | POA: Diagnosis not present

## 2022-08-06 DIAGNOSIS — Z7985 Long-term (current) use of injectable non-insulin antidiabetic drugs: Secondary | ICD-10-CM

## 2022-08-06 MED ORDER — TIRZEPATIDE 10 MG/0.5ML ~~LOC~~ SOAJ
10.0000 mg | SUBCUTANEOUS | 0 refills | Status: DC
Start: 2022-08-06 — End: 2022-09-01
  Filled 2022-08-06: qty 6, 84d supply, fill #0

## 2022-08-06 NOTE — Progress Notes (Signed)
Office: [phone]  /  Fax: [phone]  WEIGHT SUMMARY AND BIOMETRICS  Vitals Temp: 98.2 F (36.8 C) BP: (!) 144/80 Pulse Rate: 85 SpO2: 94 %   Anthropometric Measurements Height: 5\' 5"  (1.651 m) Weight: 234 lb (106.1 kg) BMI (Calculated): 38.94 Weight at Last Visit: 238lb Weight Lost Since Last Visit: 4lb Weight Gained Since Last Visit: 0 Starting Weight: 241lb Total Weight Loss (lbs): 14 lb (6.35 kg)   Body Composition  Body Fat %: 49.6 % Fat Mass (lbs): 116.2 lbs Muscle Mass (lbs): 112 lbs Total Body Water (lbs): 83.2 lbs Visceral Fat Rating : 15   Other Clinical Data Fasting: no Labs: no Today's Visit #: 13 Starting Date: 07/24/21     HPI  Chief Complaint: OBESITY  [name] is here to discuss her progress with her obesity treatment plan. She is on the the Category 2 Plan and states she is following her eating plan approximately 85 % of the time. She states she is exercising 60 minutes 3 times per week.   Interval History:  Since last office visit she is down 4 pounds. TBW loss ~ 3 %, but improved weight loss following initiation of Mounjaro in Jan 2024.  Hunger/appetite-Moderate control Not skipping meals per se, using protein supplements to meet protein goals and feels her protein needs are being met.  Cravings- Not excessive Exercise-Both cardio and strengthening with Silver Sneakers 60 mins 3 times weekly Hydration-adequate for most part.   Going to [place] with daughter for mother's day.  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 Trying to get Sotatercept. Plan TEE.   Pharmacotherapy: Mounjaro 10 mg weekly. Denies mass in neck, dysphagia, dyspepsia, persistent hoarseness, abdominal pain, or vomiting or diarrhea. Has annual eye exam- last exam ~ 1.5 months ago. Mood is stable. Mild nausea from  time to time, does not need medication to stop and does not stop her from normal activities. Mild constipation at times, but normally having BM daily to several times daily, but smaller amounts at times. No ABD pain.   Bariatric Surgery: None   TREATMENT PLAN FOR OBESITY:  Recommended Dietary Goals  [name] is currently in the action stage of change. As such, her goal is to continue weight management plan. She has agreed to the Category 2 Plan.  Behavioral Intervention  We discussed the following Behavioral Modification Strategies today: increasing lean protein intake, decreasing simple carbohydrates , increasing vegetables, increasing lower glycemic fruits, increasing fiber rich foods, avoiding skipping meals, increasing water intake, continue to practice mindfulness when eating, and planning for success.  Additional resources provided today: NA  Recommended Physical Activity Goals  [name] has been advised to work up to 150 minutes of moderate intensity aerobic activity a week and strengthening exercises 2-3 times per week for cardiovascular health, weight loss maintenance and preservation of muscle mass.   She has agreed to Continue current level of physical activity    Pharmacotherapy We discussed various medication options to help [name] with her weight loss efforts and we both agreed to continue Mounjaro 10 mg weekly for Type 2 diabetes and continue to work on nutritional and behavioral strategies to promote weight loss.     Return in about 4 weeks (around 09/03/2022).Marland Kitchen She was informed of the importance of frequent follow up visits to maximize her success with intensive lifestyle modifications for her multiple health conditions.  PHYSICAL EXAM:  Blood pressure (!) 144/80, pulse 85, temperature 98.2 F (36.8 C), height 5\' 5"  (1.651 m), weight 234 lb (106.1 kg), last menstrual period 05/11/2013, SpO2 94 %. Body mass index is 38.94 kg/m.  General: She is overweight, cooperative,  alert, well developed, and in no acute distress. PSYCH: Has normal mood, affect and thought process.   Cardiovascular ; HR 80's regular Lungs: Normal breathing effort, on usual Oxygen by nasal cannula. No SOB with general conservation.  Neuro: no focal deficits.   DIAGNOSTIC DATA REVIEWED:  BMET    Component Value Date/Time   NA 143 07/13/2022 1156   NA 139 03/31/2017 1332   K 3.7 07/13/2022 1156   K 3.6 03/31/2017 1332   CL 104 07/13/2022 1156   CO2 23 07/13/2022 1156   CO2 26 03/31/2017 1332   GLUCOSE 127 (H) 07/13/2022 1156   GLUCOSE 110 (H) 10/23/2021 1150   GLUCOSE 129 03/31/2017 1332   BUN 15 07/13/2022 1156   BUN 18.1 03/31/2017 1332   CREATININE 0.86 07/13/2022 1156   CREATININE 1.01 (H) 10/23/2021 1150   CREATININE 1.0 03/31/2017 1332   CALCIUM 9.3 07/13/2022 1156   CALCIUM 9.2 03/31/2017 1332   GFRNONAA >60 10/23/2021 1150   GFRAA 83 03/05/2020 1409   Lab Results  Component Value Date   HGBA1C 5.3 04/30/2022   HGBA1C 6.2 (H) 06/09/2013   No results found for: "INSULIN" Lab Results  Component Value Date   TSH 0.82 07/03/2021   CBC    Component Value Date/Time   WBC 6.2 10/23/2021 1150   WBC 6.2 07/03/2021 1138   RBC 3.98 10/23/2021 1150   HGB 12.1 10/23/2021 1150   HGB 11.0 (L) 11/06/2020 1406   HGB 13.4 03/31/2017 1332   HCT 36.0 10/23/2021 1150   HCT 32.7 (L) 11/06/2020 1406   HCT 39.7 03/31/2017 1332   PLT 101 (L) 10/23/2021 1150   PLT 94 (LL) 11/06/2020 1406   MCV 90.5 10/23/2021 1150   MCV 92 11/06/2020 1406   MCV 99.3 03/31/2017 1332   MCH 30.4 10/23/2021 1150   MCHC 33.6 10/23/2021 1150   RDW 13.1 10/23/2021 1150   RDW 13.1 11/06/2020 1406   RDW 14.0 03/31/2017 1332   Iron Studies    Component Value Date/Time   IRON 81 12/23/2020 1045   IRON 33 (L) 06/14/2013 1141   TIBC 366 12/23/2020 1045   TIBC 441 06/14/2013 1141   FERRITIN 160 12/23/2020 1045   FERRITIN 88 06/14/2013 1141   IRONPCTSAT 22 12/23/2020 1045   IRONPCTSAT 16  08/30/2017 1648   Lipid Panel     Component Value Date/Time   CHOL 180 04/30/2022 1230   TRIG 151 (H) 04/30/2022 1230   HDL 43 04/30/2022 1230   CHOLHDL 4.2 04/30/2022 1230   CHOLHDL 4 07/03/2021 1138   VLDL 23.6 07/03/2021 1138   LDLCALC 110 (H) 04/30/2022 1230   Hepatic Function Panel     Component Value Date/Time   PROT 6.9 07/13/2022 1156   PROT 7.8 03/31/2017 1332   ALBUMIN 3.8 07/13/2022 1156   ALBUMIN 3.5 03/31/2017 1332   AST 14 07/13/2022 1156   AST 12 (L) 10/23/2021 1150   AST 16 03/31/2017 1332   ALT 8 07/13/2022 1156   ALT 10 10/23/2021 1150   ALT 14 03/31/2017 1332   ALKPHOS 90 07/13/2022 1156   ALKPHOS 96 03/31/2017 1332   BILITOT 0.5 07/13/2022 1156   BILITOT 0.6 10/23/2021 1150   BILITOT 0.97 03/31/2017 1332  BILIDIR 0.2 12/31/2015 1315      Component Value Date/Time   TSH 0.82 07/03/2021 1138   Nutritional Lab Results  Component Value Date   VD25OH 43.0 07/13/2022   VD25OH 30.38 07/03/2021   VD25OH 60.62 10/25/2020    ASSOCIATED CONDITIONS ADDRESSED TODAY  ASSESSMENT AND PLAN  Problem List Items Addressed This Visit     Type 2 diabetes mellitus with microalbuminuria, without long-term current use of insulin (HCC) - Primary   Relevant Medications   tirzepatide (MOUNJARO) 10 MG/0.5ML Pen   Vitamin D deficiency   BMI 39.0-39.9,adult, Current BMI 39.9   Obesity (HCC)-start bmi 40.10   Relevant Medications   tirzepatide (MOUNJARO) 10 MG/0.5ML Pen   Type 2 Diabetes Mellitus with other specified complication, without long-term current use of insulin HgbA1c is at goal. Last A1c was 5.3  Statin Therapy: No Medication(s): Mounjaro 10 mg SQ weekly  Lab Results  Component Value Date   HGBA1C 5.3 04/30/2022   HGBA1C 5.5 07/03/2021   HGBA1C 5.5 10/25/2020   Lab Results  Component Value Date   MICROALBUR 6.4 07/03/2021   LDLCALC 110 (H) 04/30/2022   CREATININE 0.86 07/13/2022   Lab Results  Component Value Date   GFR 66.99 10/25/2020    GFR 46.84 (L) 01/11/2019   GFR 59.76 (L) 12/22/2018    Plan: Continue and refill Mounjaro 10 mg SQ weekly Continue working on nutrition plan to decrease simple carbohydrates, increase lean proteins and exercise to promote weight loss,and improve glycemic control.  Vitamin D Deficiency Vitamin D is not at goal of 50.  Most recent vitamin D level was 43.0. 07/13/2022. She is on  prescription ergocalciferol 50,000 IU weekly. No side effects with Ergocalciferol, no N/V or muscle weakness. Reports no refill needed this visit.  Lab Results  Component Value Date   VD25OH 43.0 07/13/2022   VD25OH 30.38 07/03/2021   VD25OH 60.62 10/25/2020    Plan: Continue  prescription ergocalciferol 50,000 IU weekly Low vitamin D levels can be associated with adiposity and may result in leptin resistance and weight gain. Also associated with fatigue. Currently on vitamin D supplementation without any adverse effects.    ATTESTASTION STATEMENTS:  Reviewed by clinician on day of visit: allergies, medications, problem list, medical history, surgical history, family history, social history, and previous encounter notes.   I have personally spent 40 minutes total time today in preparation, patient care, nutritional counseling and documentation for this visit, including the following: review of clinical lab tests; review of medical tests/procedures/services.      Lavontae Cornia, PA-C

## 2022-08-18 ENCOUNTER — Other Ambulatory Visit (HOSPITAL_COMMUNITY): Payer: Self-pay

## 2022-08-21 ENCOUNTER — Encounter: Payer: Self-pay | Admitting: Internal Medicine

## 2022-09-01 ENCOUNTER — Encounter (INDEPENDENT_AMBULATORY_CARE_PROVIDER_SITE_OTHER): Payer: Self-pay | Admitting: Physician Assistant

## 2022-09-01 ENCOUNTER — Ambulatory Visit (INDEPENDENT_AMBULATORY_CARE_PROVIDER_SITE_OTHER): Payer: 59 | Admitting: Physician Assistant

## 2022-09-01 ENCOUNTER — Other Ambulatory Visit (HOSPITAL_COMMUNITY): Payer: Self-pay

## 2022-09-01 VITALS — BP 121/78 | HR 118 | Temp 98.0°F | Ht 65.0 in | Wt 227.0 lb

## 2022-09-01 DIAGNOSIS — E1129 Type 2 diabetes mellitus with other diabetic kidney complication: Secondary | ICD-10-CM

## 2022-09-01 DIAGNOSIS — R809 Proteinuria, unspecified: Secondary | ICD-10-CM | POA: Diagnosis not present

## 2022-09-01 DIAGNOSIS — Z7985 Long-term (current) use of injectable non-insulin antidiabetic drugs: Secondary | ICD-10-CM

## 2022-09-01 DIAGNOSIS — E669 Obesity, unspecified: Secondary | ICD-10-CM | POA: Diagnosis not present

## 2022-09-01 DIAGNOSIS — E559 Vitamin D deficiency, unspecified: Secondary | ICD-10-CM

## 2022-09-01 DIAGNOSIS — K760 Fatty (change of) liver, not elsewhere classified: Secondary | ICD-10-CM

## 2022-09-01 DIAGNOSIS — Z6837 Body mass index (BMI) 37.0-37.9, adult: Secondary | ICD-10-CM

## 2022-09-01 MED ORDER — TIRZEPATIDE 10 MG/0.5ML ~~LOC~~ SOAJ
10.0000 mg | SUBCUTANEOUS | 0 refills | Status: DC
Start: 2022-09-01 — End: 2022-09-29
  Filled 2022-09-01: qty 6, 84d supply, fill #0

## 2022-09-01 NOTE — Progress Notes (Signed)
.smr  Office: [phone]  /  Fax: [phone]  WEIGHT SUMMARY AND BIOMETRICS  Vitals Temp: 98 F (36.7 C) BP: 121/78 Pulse Rate: (!) 118 SpO2: 97 %   Anthropometric Measurements Height: 5\' 5"  (1.651 m) Weight: 227 lb (103 kg) BMI (Calculated): 37.77 Weight at Last Visit: 234 lb Weight Lost Since Last Visit: 7 lb Starting Weight: 241 lb   Body Composition  Body Fat %: 46.9 % Fat Mass (lbs): 106.8 lbs Muscle Mass (lbs): 114.6 lbs Total Body Water (lbs): 79.8 lbs   Other Clinical Data Fasting: no Labs: no Today's Visit #: 14 Starting Date: 07/24/21     HPI  Chief Complaint: OBESITY  [name] is here to discuss her progress with her obesity treatment plan. She is on the the Category 2 Plan and states she is following her eating plan approximately 85 % of the time. She states she is exercising 60 minutes 3 times per week.   Interval History:  Since last office visit she is down 7 lbs. / Total of 14 lbs.  She vacationed in [place] with her daughter and had a nice trip and did not get off track to greatly with her nutrition while traveling. 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  Started sotatercept in May 2024. Plan TEE.   Pharmacotherapy: Mounjaro 10 mg weekly. Denies mass in neck, dysphagia, dyspepsia, persistent hoarseness, abdominal pain, or N/V/Constipation or diarrhea. Has annual eye exam. Mood is stable.   Has goal weight of 185 lbs.   TREATMENT PLAN FOR OBESITY:  Recommended Dietary Goals  [name] is  currently in the action stage of change. As such, her goal is to continue weight management plan. She has agreed to the Category 2 Plan.  Behavioral Intervention  We discussed the following Behavioral Modification Strategies today: increasing lean protein intake, decreasing simple carbohydrates , increasing vegetables, increasing lower glycemic fruits, avoiding skipping meals, increasing water intake, continue to practice mindfulness when eating, and planning for success.  Additional resources provided today: NA  Recommended Physical Activity Goals  [name] has been advised to work up to 150 minutes of moderate intensity aerobic activity a week and strengthening exercises 2-3 times per week for cardiovascular health, weight loss maintenance and preservation of muscle mass.   She has agreed to Continue current level of physical activity    Pharmacotherapy We discussed various medication options to help [name] with her weight loss efforts and we both agreed to continue Mounjaro 10 mg weekly for type 2 diabetes.    Return in about 4 weeks (around 09/29/2022).Marland Kitchen She was informed of the importance of frequent follow up visits to maximize her success with intensive lifestyle modifications for her multiple health conditions.  PHYSICAL EXAM:  Blood pressure 121/78, pulse (!) 118, temperature 98 F (36.7 C), height 5\' 5"  (1.651 m), weight 227 lb (103 kg), last menstrual period 05/11/2013, SpO2 97 %. Body mass index is 37.77 kg/m.  General: She is overweight, cooperative, alert, well developed, and in no acute distress. PSYCH: Has normal mood, affect and thought process.   Cardiovascular: HR 110's today, but she has not taken her metoprolol yet today. BP 121/78  Lungs: Normal breathing effort, no conversational dyspnea. On usual chronic oxygen by nasal cannula.  Neuro: no focal deficits  DIAGNOSTIC DATA REVIEWED:  BMET    Component Value Date/Time   NA 143 07/13/2022 1156   NA 139  03/31/2017 1332   K 3.7 07/13/2022 1156   K 3.6 03/31/2017 1332   CL 104 07/13/2022 1156   CO2 23 07/13/2022 1156   CO2 26 03/31/2017 1332   GLUCOSE 127 (H) 07/13/2022 1156   GLUCOSE 110 (H) 10/23/2021 1150   GLUCOSE 129 03/31/2017 1332   BUN 15 07/13/2022 1156   BUN 18.1 03/31/2017 1332   CREATININE 0.86 07/13/2022 1156   CREATININE 1.01 (H) 10/23/2021 1150   CREATININE 1.0 03/31/2017 1332   CALCIUM 9.3 07/13/2022 1156   CALCIUM 9.2 03/31/2017 1332   GFRNONAA >60 10/23/2021 1150   GFRAA 83 03/05/2020 1409   Lab Results  Component Value Date   HGBA1C 5.3 04/30/2022   HGBA1C 6.2 (H) 06/09/2013   No results found for: "INSULIN" Lab Results  Component Value Date   TSH 0.82 07/03/2021   CBC    Component Value Date/Time   WBC 6.2 10/23/2021 1150   WBC 6.2 07/03/2021 1138   RBC 3.98 10/23/2021 1150   HGB 12.1 10/23/2021 1150   HGB 11.0 (L) 11/06/2020 1406   HGB 13.4 03/31/2017 1332   HCT 36.0 10/23/2021 1150   HCT 32.7 (L) 11/06/2020 1406   HCT 39.7 03/31/2017 1332   PLT 101 (L) 10/23/2021 1150   PLT 94 (LL) 11/06/2020 1406   MCV 90.5 10/23/2021 1150   MCV 92 11/06/2020 1406   MCV 99.3 03/31/2017 1332   MCH 30.4 10/23/2021 1150   MCHC 33.6 10/23/2021 1150   RDW 13.1 10/23/2021 1150   RDW 13.1 11/06/2020 1406   RDW 14.0 03/31/2017 1332   Iron Studies    Component Value Date/Time   IRON 81 12/23/2020 1045   IRON 33 (L) 06/14/2013 1141   TIBC 366 12/23/2020 1045   TIBC 441 06/14/2013 1141   FERRITIN 160 12/23/2020 1045   FERRITIN 88 06/14/2013 1141   IRONPCTSAT 22 12/23/2020 1045   IRONPCTSAT 16 08/30/2017 1648   Lipid Panel     Component Value Date/Time   CHOL 180 04/30/2022 1230   TRIG 151 (H) 04/30/2022 1230   HDL 43 04/30/2022 1230   CHOLHDL 4.2 04/30/2022 1230   CHOLHDL 4 07/03/2021 1138   VLDL 23.6 07/03/2021 1138   LDLCALC 110 (H) 04/30/2022 1230   Hepatic Function Panel     Component Value Date/Time   PROT 6.9 07/13/2022 1156   PROT 7.8  03/31/2017 1332   ALBUMIN 3.8 07/13/2022 1156   ALBUMIN 3.5 03/31/2017 1332   AST 14 07/13/2022 1156   AST 12 (L) 10/23/2021 1150   AST 16 03/31/2017 1332   ALT 8 07/13/2022 1156   ALT 10 10/23/2021 1150   ALT 14 03/31/2017 1332   ALKPHOS 90 07/13/2022 1156   ALKPHOS 96 03/31/2017 1332   BILITOT 0.5 07/13/2022 1156   BILITOT 0.6 10/23/2021 1150   BILITOT 0.97 03/31/2017 1332   BILIDIR 0.2 12/31/2015 1315      Component Value Date/Time   TSH 0.82 07/03/2021 1138   Nutritional Lab Results  Component Value Date   VD25OH 43.0 07/13/2022   VD25OH 30.38 07/03/2021   VD25OH 60.62 10/25/2020    ASSOCIATED CONDITIONS ADDRESSED TODAY  ASSESSMENT AND PLAN  Problem List Items Addressed This Visit     Type 2 diabetes  mellitus with microalbuminuria, without long-term current use of insulin (HCC) - Primary   Relevant Medications   tirzepatide (MOUNJARO) 10 MG/0.5ML Pen   Vitamin D deficiency   BMI 37.0-37.9, adult   Obesity (HCC)-start bmi 40.10   Relevant Medications   tirzepatide (MOUNJARO) 10 MG/0.5ML Pen   Type 2 Diabetes Mellitus with other specified complication, without long-term current use of insulin HgbA1c is at goal. Last A1c was 5.3  Medication(s): Mounjaro 10 mg SQ weekly Denies mass in neck, dysphagia, dyspepsia, persistent hoarseness, abdominal pain, or N/V/Constipation or diarrhea. Has annual eye exam. Mood is stable.  She is working on nutrition plan to decrease simple carbohydrates, increase lean proteins and exercise to promote weight loss and improve glycemic control.   Lab Results  Component Value Date   HGBA1C 5.3 04/30/2022   HGBA1C 5.5 07/03/2021   HGBA1C 5.5 10/25/2020   Lab Results  Component Value Date   MICROALBUR 6.4 07/03/2021   LDLCALC 110 (H) 04/30/2022   CREATININE 0.86 07/13/2022   Lab Results  Component Value Date   GFR 66.99 10/25/2020   GFR 46.84 (L) 01/11/2019   GFR 59.76 (L) 12/22/2018    Plan: Continue and refill Mounjaro  10 mg SQ weekly Continue working on nutrition plan to decrease simple carbohydrates, increase lean proteins and exercise to promote weight loss and improve glycemic control.   ATTESTASTION STATEMENTS:  Reviewed by clinician on day of visit: allergies, medications, problem list, medical history, surgical history, family history, social history, and previous encounter notes.   I have personally spent 30 minutes total time today in preparation, patient care, nutritional counseling and documentation for this visit, including the following: review of clinical lab tests; review of medical tests/procedures/services.      Elchanan Bob, PA-C

## 2022-09-09 ENCOUNTER — Other Ambulatory Visit (HOSPITAL_COMMUNITY): Payer: Self-pay

## 2022-09-09 ENCOUNTER — Encounter: Payer: Self-pay | Admitting: Nurse Practitioner

## 2022-09-09 ENCOUNTER — Ambulatory Visit: Payer: 59 | Admitting: Nurse Practitioner

## 2022-09-09 VITALS — BP 130/68 | HR 98 | Temp 98.8°F | Ht 65.0 in | Wt 228.6 lb

## 2022-09-09 DIAGNOSIS — J9611 Chronic respiratory failure with hypoxia: Secondary | ICD-10-CM

## 2022-09-09 DIAGNOSIS — R809 Proteinuria, unspecified: Secondary | ICD-10-CM | POA: Diagnosis not present

## 2022-09-09 DIAGNOSIS — I5032 Chronic diastolic (congestive) heart failure: Secondary | ICD-10-CM

## 2022-09-09 DIAGNOSIS — D696 Thrombocytopenia, unspecified: Secondary | ICD-10-CM

## 2022-09-09 DIAGNOSIS — E1129 Type 2 diabetes mellitus with other diabetic kidney complication: Secondary | ICD-10-CM | POA: Diagnosis not present

## 2022-09-09 DIAGNOSIS — R188 Other ascites: Secondary | ICD-10-CM

## 2022-09-09 DIAGNOSIS — E7800 Pure hypercholesterolemia, unspecified: Secondary | ICD-10-CM

## 2022-09-09 DIAGNOSIS — R350 Frequency of micturition: Secondary | ICD-10-CM

## 2022-09-09 DIAGNOSIS — K7460 Unspecified cirrhosis of liver: Secondary | ICD-10-CM

## 2022-09-09 DIAGNOSIS — I2782 Chronic pulmonary embolism: Secondary | ICD-10-CM | POA: Diagnosis not present

## 2022-09-09 DIAGNOSIS — G620 Drug-induced polyneuropathy: Secondary | ICD-10-CM

## 2022-09-09 DIAGNOSIS — I110 Hypertensive heart disease with heart failure: Secondary | ICD-10-CM

## 2022-09-09 DIAGNOSIS — I2721 Secondary pulmonary arterial hypertension: Secondary | ICD-10-CM

## 2022-09-09 DIAGNOSIS — Z0000 Encounter for general adult medical examination without abnormal findings: Secondary | ICD-10-CM | POA: Diagnosis not present

## 2022-09-09 DIAGNOSIS — C50212 Malignant neoplasm of upper-inner quadrant of left female breast: Secondary | ICD-10-CM

## 2022-09-09 DIAGNOSIS — D701 Agranulocytosis secondary to cancer chemotherapy: Secondary | ICD-10-CM | POA: Insufficient documentation

## 2022-09-09 DIAGNOSIS — Z170 Estrogen receptor positive status [ER+]: Secondary | ICD-10-CM

## 2022-09-09 DIAGNOSIS — T451X5A Adverse effect of antineoplastic and immunosuppressive drugs, initial encounter: Secondary | ICD-10-CM

## 2022-09-09 LAB — POCT URINALYSIS DIP (CLINITEK)
Bilirubin, UA: NEGATIVE
Glucose, UA: NEGATIVE mg/dL
Ketones, POC UA: NEGATIVE mg/dL
Leukocytes, UA: NEGATIVE
Nitrite, UA: NEGATIVE
POC PROTEIN,UA: >=300 — AB
Spec Grav, UA: >=1.03 — AB (ref 1.010–1.025)
Urobilinogen, UA: 0.2 E.U./dL
pH, UA: 6 (ref 5.0–8.0)

## 2022-09-09 MED ORDER — NITROFURANTOIN MONOHYD MACRO 100 MG PO CAPS
100.0000 mg | ORAL_CAPSULE | Freq: Two times a day (BID) | ORAL | 0 refills | Status: AC
Start: 2022-09-09 — End: 2022-09-14

## 2022-09-09 NOTE — Patient Instructions (Signed)
  Ms. Collignon , Thank you for taking time to come for your Medicare Wellness Visit. I appreciate your ongoing commitment to your health goals. Please review the following plan we discussed and let me know if I can assist you in the future.   These are the goals we discussed:  Goals   None     This is a list of the screening recommended for you and due dates:  Health Maintenance  Topic Date Due   COVID-19 Vaccine (4 - 2023-24 season) 11/28/2021   Yearly kidney health urinalysis for diabetes  07/04/2022   Flu Shot  10/29/2022   Hemoglobin A1C  10/29/2022   Yearly kidney function blood test for diabetes  07/13/2023   Medicare Annual Wellness Visit  09/09/2023   Mammogram  03/24/2024   DTaP/Tdap/Td vaccine (3 - Td or Tdap) 06/22/2027   Colon Cancer Screening  01/01/2031   Hepatitis C Screening  Completed   HIV Screening  Completed   HPV Vaccine  Aged Out   Complete foot exam   Discontinued   Pap Smear  Discontinued   Eye exam for diabetics  Discontinued   Zoster (Shingles) Vaccine  Discontinued

## 2022-09-09 NOTE — Progress Notes (Addendum)
I,Bibiana Gillean,acting as a Neurosurgeon for Arnette Felts, FNP.,have documented all relevant documentation on the behalf of Arnette Felts, FNP,as directed by  Arnette Felts, FNP while in the presence of Arnette Felts, FNP.  Subjective:    Patient ID: [name] , female    DOB: Sep 23, 1965 , 57 y.o.   MRN: [id number]  Chief Complaint  Patient presents with   medicare welcome    HPI  Patient presents today for a welcome to medicare visit, patient reports compliance with medications and has no other concerns today.   She is under increased stress with life and her health. She has a disconnect notice for her utilities.   She has been started on Winniver by Dr Tresa Res, has been having a headache daily.     Wt Readings from Last 3 Encounters: 09/09/22 : 228 lb 9.6 oz (103.7 kg) 09/01/22 : 227 lb (103 kg) 08/06/22 : 234 lb (106.1 kg)  Her weight in February was 245 lbs.      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   Menorrhagia    secondary to uterine fibroids   OSA (obstructive sleep apnea)    07/25/13 HST AHI 33/hr, severe hypoxemia O2 min 42% and 95% of the time <89%   PE (pulmonary embolism) 2014   bilateral   Prediabetes    Pulmonary artery hypertension (HCC)    Pulmonary nodule    (5mm on loeft lower lobe) found on CT scan July 2014, repeat scan Jan 2015 showed less than 4mm   Right ovarian cyst    noted 09/2012    S/P TAH (total abdominal hysterectomy) 06/07/2013   Sleep apnea    SOB (shortness of breath) on exertion    Stomach ulcer    Trichomoniasis     05/2011    Vitamin D deficiency      Family History  Problem Relation Age of Onset   Hypertension Mother    Kidney disease Mother    Heart disease Mother    Alcoholism Mother    Hypertension Father    Heart disease Father    Stroke Sister    Hypertension Brother    Breast cancer Maternal Grandmother        died at 63   Breast cancer Paternal Grandmother    Cancer Maternal Aunt        unknown form   Breast cancer Paternal Aunt    Breast cancer Paternal Aunt    Breast cancer Cousin        pat first cousin dx in her 30s   Colon cancer Other 4       MGMs brother   Cancer Other        breast ca in [place]   Cancer Other        g uncle colon or stomach ca     Current Outpatient Medications:    acetaminophen (TYLENOL) 500 MG tablet, Take 1,000 mg by mouth every 6 (six) hours as  needed for moderate pain or headache. , Disp: , Rfl:    albuterol (ACCUNEB) 1.25 MG/3ML nebulizer solution, Take 3 mLs (1.25 mg total) by nebulization every 6 (six) hours as needed for wheezing., Disp: 360 mL, Rfl: 12   albuterol (PROAIR HFA) 108 (90 Base) MCG/ACT inhaler, Inhale 1-2 puffs into the lungs every 6 (six) hours as needed for wheezing or shortness of breath., Disp: 18 g, Rfl: 11   allopurinol (ZYLOPRIM) 300 MG tablet, TAKE 1 TABLET BY MOUTH EVERY DAY NEED APPT, Disp: 90 tablet, Rfl: 3   anastrozole (ARIMIDEX) 1 MG tablet, TAKE 1 TABLET BY MOUTH EVERY DAY (Patient not taking: Reported on 10/15/2022), Disp: 90 tablet, Rfl: 3   atorvastatin (LIPITOR) 20 MG tablet, Take 1 tablet (20 mg total) by mouth daily. (Patient not taking: Reported on 10/15/2022), Disp: 30 tablet, Rfl: 2   Azelastine HCl 137 MCG/SPRAY SOLN, PLACE 1 SPRAY INTO BOTH NOSTRILS 2 (TWO) TIMES DAILY. USE IN EACH NOSTRIL AS DIRECTED, Disp: 30 mL, Rfl: 4   chlorpheniramine-HYDROcodone (TUSSIONEX) 10-8 MG/5ML, Take 5 mLs by mouth every 12 (twelve) hours as needed for cough., Disp: 115 mL, Rfl: 0   clotrimazole-betamethasone (LOTRISONE) cream,  Apply 1 application topically 2 (two) times daily. Prn leg dermatitis, Disp: 45 g, Rfl: 0   Colchicine (MITIGARE) 0.6 MG CAPS, Take 1 capsule by mouth as needed. As needed gout flare max # of pills 2 in 24 hours, Disp: 30 capsule, Rfl: 11   fluticasone (FLONASE) 50 MCG/ACT nasal spray, Place into both nostrils daily as needed for allergies or rhinitis., Disp: , Rfl:    Fluticasone-Salmeterol (ADVAIR HFA IN), Inhale 1 puff into the lungs as needed., Disp: , Rfl:    ibuprofen (ADVIL) 800 MG tablet, Take 1 tablet (800 mg total) by mouth every 8 (eight) hours as needed., Disp: 90 tablet, Rfl: 1   loratadine (CLARITIN) 10 MG tablet, Take 1 tablet (10 mg total) by mouth daily., Disp: 90 tablet, Rfl: 3   Magnesium Cl-Calcium Carbonate (SLOW MAGNESIUM/CALCIUM) 70-117 MG TBEC, Take 2 tablets by mouth in the morning and at bedtime., Disp: , Rfl:    metoprolol tartrate (LOPRESSOR) 25 MG tablet, TAKE 1 TABLET BY MOUTH TWICE A DAY, Disp: 180 tablet, Rfl: 3   Multiple Vitamin (MULTIVITAMIN) tablet, Take 1 tablet by mouth daily., Disp: , Rfl:    omeprazole (PRILOSEC) 40 MG capsule, Take 40 mg by mouth as needed., Disp: , Rfl:    OPSUMIT 10 MG TABS, Take 10 mg by mouth daily., Disp: , Rfl: 8   Polyethyl Glycol-Propyl Glycol (SYSTANE OP), Place 1 drop into both eyes 3 (three) times daily as needed (dry/irritated eyes.)., Disp: , Rfl:    potassium chloride SA (KLOR-CON) 20 MEQ tablet, Take 1 tablet (20 mEq total) by mouth daily., Disp: , Rfl:    rivaroxaban (XARELTO) 20 MG TABS tablet, Take 20 mg by mouth daily with supper., Disp: , Rfl:    sildenafil (REVATIO) 20 MG tablet, Take 1-2 tablets (20-40 mg total) by mouth 3 (three) times daily. As of 07/03/20 taking 20 mg tid per [facility] Dr. Tresa Res, Disp: 10 tablet, Rfl: 0   spironolactone (ALDACTONE) 50 MG tablet, Take 1 tablet (50 mg total) by mouth daily., Disp: 90 tablet, Rfl: 3   tirzepatide (MOUNJARO) 7.5 MG/0.5ML Pen, Inject 7.5 mg into the skin once a week., Disp:  2 mL, Rfl: 0   torsemide (DEMADEX) 20 MG tablet, Take 20 mg by mouth 2 (two) times daily., Disp: , Rfl:  treprostinil (REMODULIN) 20 MG/20ML injection, 20 ng/kg/min by Continuous infusion (non-IV) route continuous., Disp: , Rfl:    triamcinolone cream (KENALOG) 0.1 %, Apply 1 application topically daily as needed (leg discoloration)., Disp: 454 g, Rfl: 2   Vitamin D, Ergocalciferol, (DRISDOL) 1.25 MG (50000 UNIT) CAPS capsule, Take 1 capsule (50,000 Units total) by mouth every 7 (seven) days., Disp: 5 capsule, Rfl: 0   WINREVAIR 2 x 45 MG subcutaneous injection, Inject into the skin., Disp: , Rfl:  No current facility-administered medications for this visit.  Facility-Administered Medications Ordered in Other Visits:    heparin lock flush 100 unit/mL, 500 Units, Intracatheter, Once PRN, Serena Croissant, MD   sodium chloride flush (NS) 0.9 % injection 10 mL, 10 mL, Intracatheter, PRN, Serena Croissant, MD   Allergies  Allergen Reactions   Ampicillin Other (See Comments)    Severe abdominal pain, dizziness (penicillin is okay)   Amoxicillin Other (See Comments)    LIGHT-HEADED and CLOSE TO "PASSING OUT"  AND ABDOMINAL PAIN Has patient had a PCN reaction causing immediate rash, facial/tongue/throat swelling, SOB or lightheadedness with hypotension: No Has patient had a PCN reaction causing severe rash involving mucus membranes or skin necrosis: No Has patient had a PCN reaction that required hospitalization No Has patient had a PCN reaction occurring within the last 10 years: No If all of the above answers are "NO", then may proceed with Cephalosporin use.   Indomethacin Other (See Comments)    Gastro irritation   Nsaids     GI ulcers    Metronidazole Nausea And Vomiting   Sulfa Antibiotics Rash and Other (See Comments)    Very bad yeast infection      The patient states she uses status post hysterectomy for birth control. Patient's last menstrual period was 05/11/2013.  Negative for:  breast discharge, breast lump(s), breast pain and breast self exam. Associated symptoms include abnormal vaginal bleeding. Pertinent negatives include abnormal bleeding (hematology), anxiety, decreased libido, depression, difficulty falling sleep, dyspareunia, history of infertility, nocturia, sexual dysfunction, sleep disturbances, urinary incontinence, urinary urgency, vaginal discharge and vaginal itching. Diet regular. The patient states her exercise level is moderate 4 days a week for one hour.   The patient's tobacco use is:  Social History   Tobacco Use  Smoking Status Former   Current packs/day: 0.00   Average packs/day: 0.3 packs/day for 10.0 years (3.3 ttl pk-yrs)   Types: Cigarettes   Start date: 10/03/2001   Quit date: 10/04/2011   Years since quitting: 11.0  Smokeless Tobacco Never  Tobacco Comments   No longer smoking cigarettes  She has been exposed to passive smoke. The patient's alcohol use is:  Social History   Substance and Sexual Activity  Alcohol Use Yes   Alcohol/week: 0.0 standard drinks of alcohol   Comment: social  . Additional information: Last pap 2 years ,  Review of Systems  Constitutional: Negative.   Eyes: Negative.   Respiratory: Negative.    Cardiovascular: Negative.   Gastrointestinal: Negative.   Endocrine: Negative.   Genitourinary: Negative.   Musculoskeletal: Negative.   Skin: Negative.   Neurological: Negative.   Hematological: Negative.   Psychiatric/Behavioral:  Negative for dysphoric mood.        Flat affect     Today's Vitals   09/09/22 1122  BP: 130/68  Pulse: 98  Temp: 98.8 F (37.1 C)  TempSrc: Oral  Weight: 228 lb 9.6 oz (103.7 kg)  Height: 5\' 5"  (1.651 m)  PainSc: 0-No pain  Body mass index is 38.04 kg/m.  Wt Readings from Last 3 Encounters:  09/29/22 227 lb (103 kg)  09/09/22 228 lb 9.6 oz (103.7 kg)  09/01/22 227 lb (103 kg)     Objective:  Physical Exam Vitals reviewed.  Constitutional:      General: She  is not in acute distress.    Appearance: Normal appearance. She is well-developed. She is obese.  HENT:     Head: Normocephalic and atraumatic.     Right Ear: Hearing, tympanic membrane, ear canal and external ear normal. There is no impacted cerumen.     Left Ear: Hearing, tympanic membrane, ear canal and external ear normal. There is no impacted cerumen.     Nose:     Comments: Deferred - masked    Mouth/Throat:     Comments: Deferred - masked Eyes:     General: Lids are normal.     Extraocular Movements: Extraocular movements intact.     Conjunctiva/sclera: Conjunctivae normal.     Pupils: Pupils are equal, round, and reactive to light.     Funduscopic exam:    Right eye: No papilledema.        Left eye: No papilledema.  Neck:     Thyroid: No thyroid mass.     Vascular: No carotid bruit.  Cardiovascular:     Rate and Rhythm: Normal rate and regular rhythm.     Pulses: Normal pulses.     Heart sounds: Normal heart sounds. No murmur heard. Pulmonary:     Effort: Pulmonary effort is normal. No respiratory distress.     Breath sounds: Normal breath sounds. No wheezing.  Chest:     Chest wall: No mass.  Breasts:    Tanner Score is 5.     Right: Normal. No mass or tenderness.     Left: Normal. No mass or tenderness.  Abdominal:     General: Abdomen is flat. Bowel sounds are normal. There is no distension.     Palpations: Abdomen is soft.     Tenderness: There is no abdominal tenderness.  Musculoskeletal:        General: No swelling. Normal range of motion.     Cervical back: Full passive range of motion without pain, normal range of motion and neck supple.     Right lower leg: No edema.     Left lower leg: No edema.  Lymphadenopathy:     Upper Body:     Right upper body: No supraclavicular, axillary or pectoral adenopathy.     Left upper body: No supraclavicular, axillary or pectoral adenopathy.  Skin:    General: Skin is warm and dry.     Capillary Refill: Capillary  refill takes less than 2 seconds.  Neurological:     General: No focal deficit present.     Mental Status: She is alert and oriented to person, place, and time.     Cranial Nerves: No cranial nerve deficit.     Sensory: No sensory deficit.     Motor: No weakness.  Psychiatric:        Mood and Affect: Mood normal.        Behavior: Behavior normal.        Thought Content: Thought content normal.        Judgment: Judgment normal.         Assessment And Plan:     1. Medicare welcome visit Pt's annual wellness exam was performed and geriatric assessment reviewed.  Pt has  no new identiafble wellness concerns at this time.  WIll obtain routine labs.  Will obtain UA and micro.  Behavior modifications discussed and diet history reviewed. Pt will continue to exercise regularly and modify diet, with low GI, plant based foods and decrease food intake of processed foods.  Recommend intake of daily multivitamin, Vitamin D, and calcium. Recommond mammogram and colonoscopy for preventive screenings, as well as recommend immunizations that include influenza (up to date) and TDAP   2. Hypertensive heart disease with congestive heart failure, chronic diastolic heart failure Comments: Blood pressure is well controlled. Continue current medications - AMB Referral to Chronic Care Management Services  3. Type 2 diabetes mellitus with microalbuminuria, without long-term current use of insulin (HCC) Comments: HgbA1c is stable continue current medications. - POCT URINALYSIS DIP (CLINITEK) - Microalbumin / creatinine urine ratio - EKG 12-Lead - Hemoglobin A1c - AMB Referral to Chronic Care Management Services  4. PAH (pulmonary artery hypertension) (HCC) Comments: Continue f/u with Cardiology  5. Urine frequency Comments: Will treat with nitrofuratoin due to symptom - Culture, Urine - nitrofurantoin, macrocrystal-monohydrate, (MACROBID) 100 MG capsule; Take 1 capsule (100 mg total) by mouth 2 (two)  times daily for 5 days.  Dispense: 10 capsule; Refill: 0  6. Chemotherapy-induced peripheral neuropathy (HCC)  7. Chronic pulmonary embolism, unspecified pulmonary embolism type, unspecified whether acute cor pulmonale present (HCC) Comments: Followed by Dr. Remi Deter  8. Cirrhosis of liver with ascites, unspecified hepatic cirrhosis type (HCC) Comments: continue f/u with GI  9. Thrombocytopenia (HCC)  10. Malignant neoplasm of upper-inner quadrant of left breast in female, estrogen receptor positive (HCC) Comments: continue f/u with Oncology  11. 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 Elevated cholesterol - Lipid panel    Return for 4 month f/u . Patient was given opportunity to ask questions. Patient verbalized understanding of the plan and was able to repeat key elements of the plan. All questions were answered to their satisfaction.   Arnette Felts, FNP  I, Arnette Felts, FNP, have reviewed all documentation for this visit. The documentation on 10/20/22 for the exam, diagnosis, procedures, and orders are all accurate and complete.   Subjective:    TIMOTHY CORRIVEAU is a 57 y.o. female who presents for a Welcome to Medicare exam.   Review of Systems See above        Objective:    Today's Vitals   09/09/22 1122  BP: 130/68  Pulse: 98  Temp: 98.8 F (37.1 C)  TempSrc: Oral  Weight: 228 lb 9.6 oz (103.7 kg)  Height: 5\' 5"  (1.651 m)  PainSc: 0-No pain  Body mass index is 38.04 kg/m.  Medications Outpatient Encounter Medications as of 09/09/2022  Medication Sig   [EXPIRED] nitrofurantoin, macrocrystal-monohydrate, (MACROBID) 100 MG capsule Take 1 capsule (100 mg total) by mouth 2 (two) times daily for 5 days.   acetaminophen (TYLENOL) 500 MG tablet Take 1,000 mg by mouth every 6 (six) hours as needed for moderate pain or headache.    albuterol (ACCUNEB) 1.25 MG/3ML nebulizer solution Take 3 mLs (1.25 mg total) by nebulization every 6 (six)  hours as needed for wheezing.   albuterol (PROAIR HFA) 108 (90 Base) MCG/ACT inhaler Inhale 1-2 puffs into the lungs every 6 (six) hours as needed for wheezing or shortness of breath.   allopurinol (ZYLOPRIM) 300 MG tablet TAKE 1 TABLET BY MOUTH EVERY DAY NEED APPT   anastrozole (ARIMIDEX) 1 MG tablet TAKE 1 TABLET BY MOUTH EVERY DAY (Patient not  taking: Reported on 10/15/2022)   Azelastine HCl 137 MCG/SPRAY SOLN PLACE 1 SPRAY INTO BOTH NOSTRILS 2 (TWO) TIMES DAILY. USE IN EACH NOSTRIL AS DIRECTED   chlorpheniramine-HYDROcodone (TUSSIONEX) 10-8 MG/5ML Take 5 mLs by mouth every 12 (twelve) hours as needed for cough.   clotrimazole-betamethasone (LOTRISONE) cream Apply 1 application topically 2 (two) times daily. Prn leg dermatitis   Colchicine (MITIGARE) 0.6 MG CAPS Take 1 capsule by mouth as needed. As needed gout flare max # of pills 2 in 24 hours   fluticasone (FLONASE) 50 MCG/ACT nasal spray Place into both nostrils daily as needed for allergies or rhinitis.   Fluticasone-Salmeterol (ADVAIR HFA IN) Inhale 1 puff into the lungs as needed.   ibuprofen (ADVIL) 800 MG tablet Take 1 tablet (800 mg total) by mouth every 8 (eight) hours as needed.   loratadine (CLARITIN) 10 MG tablet Take 1 tablet (10 mg total) by mouth daily.   Magnesium Cl-Calcium Carbonate (SLOW MAGNESIUM/CALCIUM) 70-117 MG TBEC Take 2 tablets by mouth in the morning and at bedtime.   metoprolol tartrate (LOPRESSOR) 25 MG tablet TAKE 1 TABLET BY MOUTH TWICE A DAY   Multiple Vitamin (MULTIVITAMIN) tablet Take 1 tablet by mouth daily.   omeprazole (PRILOSEC) 40 MG capsule Take 40 mg by mouth as needed.   OPSUMIT 10 MG TABS Take 10 mg by mouth daily.   Polyethyl Glycol-Propyl Glycol (SYSTANE OP) Place 1 drop into both eyes 3 (three) times daily as needed (dry/irritated eyes.).   potassium chloride SA (KLOR-CON) 20 MEQ tablet Take 1 tablet (20 mEq total) by mouth daily.   rivaroxaban (XARELTO) 20 MG TABS tablet Take 20 mg by mouth daily  with supper.   sildenafil (REVATIO) 20 MG tablet Take 1-2 tablets (20-40 mg total) by mouth 3 (three) times daily. As of 07/03/20 taking 20 mg tid per [facility] Dr. Tresa Res   spironolactone (ALDACTONE) 50 MG tablet Take 1 tablet (50 mg total) by mouth daily.   treprostinil (REMODULIN) 20 MG/20ML injection 20 ng/kg/min by Continuous infusion (non-IV) route continuous.   triamcinolone cream (KENALOG) 0.1 % Apply 1 application topically daily as needed (leg discoloration).   Vitamin D, Ergocalciferol, (DRISDOL) 1.25 MG (50000 UNIT) CAPS capsule Take 1 capsule (50,000 Units total) by mouth every 7 (seven) days.   [DISCONTINUED] prochlorperazine (COMPAZINE) 10 MG tablet Take 1 tablet (10 mg total) by mouth every 6 (six) hours as needed (Nausea or vomiting).   [DISCONTINUED] tirzepatide (MOUNJARO) 10 MG/0.5ML Pen Inject 10 mg into the skin once a week.   [DISCONTINUED] torsemide (DEMADEX) 100 MG tablet Take 100 mg by mouth daily.    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   Menorrhagia    secondary to uterine fibroids   OSA (obstructive sleep apnea)    07/25/13 HST AHI  33/hr, severe hypoxemia O2 min 42% and 95% of the time <89%   PE (pulmonary embolism) 2014   bilateral   Prediabetes    Pulmonary artery hypertension (HCC)    Pulmonary nodule    (5mm on loeft lower lobe) found on CT scan July 2014, repeat scan Jan 2015  showed less than 4mm   Right ovarian cyst    noted 09/2012    S/P TAH (total abdominal hysterectomy) 06/07/2013   Sleep apnea    SOB (shortness of breath) on exertion    Stomach ulcer    Trichomoniasis    05/2011    Vitamin D deficiency    Past Surgical History:  Procedure Laterality Date   ABDOMINAL HYSTERECTOMY N/A 06/07/2013   Procedure: HYSTERECTOMY ABDOMINAL WITH BIALTERAL SALPINGECTOMY;  Surgeon: Robley Fries, MD;  Location: [facility];  Service: Gynecology;  Laterality: N/A;   BIOPSY  09/02/2017   Procedure: BIOPSY;  Surgeon: Charlott Rakes, MD;  Location: [facility];  Service: Endoscopy;;   BIOPSY  12/31/2020   Procedure: BIOPSY;  Surgeon: Charlott Rakes, MD;  Location: [facility];  Service: Endoscopy;;   BREAST LUMPECTOMY Left 07/27/2016   BREAST LUMPECTOMY WITH RADIOACTIVE SEED AND SENTINEL LYMPH NODE BIOPSY (Left)   BREAST LUMPECTOMY WITH RADIOACTIVE SEED AND SENTINEL LYMPH NODE BIOPSY Left 07/27/2016   Procedure: BREAST LUMPECTOMY WITH RADIOACTIVE SEED AND SENTINEL LYMPH NODE BIOPSY;  Surgeon: Almond Lint, MD;  Location: [facility];  Service: General;  Laterality: Left;   CARDIAC CATHETERIZATION N/A 12/24/2015   Procedure: Right Heart Cath;  Surgeon: Yates Decamp, MD;  Location[facility];  Service: Cardiovascular;  Laterality: N/A;   CESAREAN SECTION     COLONOSCOPY     COLONOSCOPY WITH PROPOFOL N/A 09/02/2017   Procedure: COLONOSCOPY WITH PROPOFOL;  Surgeon: Charlott Rakes, MD;  Location: [facility];  Service: Endoscopy;  Laterality: N/A;   COLONOSCOPY WITH PROPOFOL Bilateral 12/31/2020   Procedure: COLONOSCOPY WITH PROPOFOL;  Surgeon: Charlott Rakes, MD;  Location: [facility];  Service: Endoscopy;  Laterality: Bilateral;   ESOPHAGOGASTRODUODENOSCOPY (EGD) WITH PROPOFOL N/A 09/02/2017   Procedure: ESOPHAGOGASTRODUODENOSCOPY (EGD) WITH PROPOFOL;  Surgeon: Charlott Rakes, MD;  Location: [facility];  Service: Endoscopy;  Laterality: N/A;    ESOPHAGOGASTRODUODENOSCOPY (EGD) WITH PROPOFOL Bilateral 12/31/2020   Procedure: ESOPHAGOGASTRODUODENOSCOPY (EGD) WITH PROPOFOL;  Surgeon: Charlott Rakes, MD;  Location: [facility];  Service: Endoscopy;  Laterality: Bilateral;   IR CV LINE INJECTION  07/27/2017   POLYPECTOMY  2008   Removal of uterine polyp   PORT-A-CATH REMOVAL Right 09/09/2017   Procedure: REMOVAL PORT-A-CATH;  Surgeon: Almond Lint, MD;  Location: [facility];  Service: General;  Laterality: Right;   PORTA CATH INSERTION  07/27/2016   PORTACATH PLACEMENT Right 07/27/2016   Procedure: INSERTION PORT-A-CATH;  Surgeon: Almond Lint, MD;  Location: [facility];  Service: General;  Laterality: Right;   SUBMUCOSAL INJECTION  09/02/2017   Procedure: SUBMUCOSAL INJECTION;  Surgeon: Charlott Rakes, MD;  Location: [facility];  Service: Endoscopy;;  epi injection    Family History  Problem Relation Age of Onset   Hypertension Mother    Kidney disease Mother    Heart disease Mother    Alcoholism Mother    Hypertension Father    Heart disease Father    Stroke Sister    Hypertension Brother    Breast cancer Maternal Grandmother        died at 35   Breast cancer Paternal Grandmother    Cancer Maternal Aunt  unknown form   Breast cancer Paternal Aunt    Breast cancer Paternal Aunt    Breast cancer Cousin        pat first cousin dx in her 42s   Colon cancer Other 16       MGMs brother   Cancer Other        breast ca in [place]   Cancer Other        g uncle colon or stomach ca   Social History   Occupational History   Occupation: Film/video editor, pharmacy tech  Tobacco Use   Smoking status: Former    Current packs/day: 0.00    Average packs/day: 0.3 packs/day for 10.0 years (3.3 ttl pk-yrs)    Types: Cigarettes    Start date: 10/03/2001    Quit date: 10/04/2011    Years since quitting: 11.0   Smokeless tobacco: Never   Tobacco comments:    No longer smoking cigarettes  Vaping Use   Vaping status:  Never Used  Substance and Sexual Activity   Alcohol use: Yes    Alcohol/week: 0.0 standard drinks of alcohol    Comment: social   Drug use: No   Sexual activity: Not Currently    Tobacco Counseling Counseling given: No Tobacco comments: No longer smoking cigarettes   Immunizations and Health Maintenance Immunization History  Administered Date(s) Administered   DTaP 04/26/2013   Hepatitis A 03/21/2018   Hepatitis A, Adult 03/21/2018, 03/14/2020   Hepatitis A, Ped/Adol-2 Dose 03/21/2018   Hepatitis B 03/21/2018   Hepb-cpg 03/21/2018   Influenza Split 01/24/2013, 01/15/2014, 01/08/2015, 01/21/2015, 01/23/2016   Influenza,inj,Quad PF,6+ Mos 01/15/2020, 01/03/2021   Influenza-Unspecified 01/08/2015, 01/24/2016, 12/28/2017, 11/29/2018   PFIZER(Purple Top)SARS-COV-2 Vaccination 05/26/2019, 06/23/2019, 03/15/2020   Pneumococcal Conjugate-13 10/25/2020   Pneumococcal Polysaccharide-23 03/12/2014   Tdap 06/21/2017   Health Maintenance Due  Topic Date Due   COVID-19 Vaccine (4 - 2023-24 season) 11/28/2021    Activities of Daily Living     No data to display         SDOH Screenings   Food Insecurity: No Food Insecurity (09/09/2022)  Housing: Medium Risk (09/09/2022)  Transportation Needs: No Transportation Needs (09/09/2022)  Utilities: At Risk (09/09/2022)  Alcohol Screen: Low Risk  (09/09/2022)  Depression (PHQ2-9): Low Risk  (09/09/2022)  Financial Resource Strain: High Risk (09/09/2022)  Physical Activity: Sufficiently Active (09/09/2022)  Social Connections: Moderately Integrated (09/09/2022)  Stress: Stress Concern Present (09/09/2022)  Tobacco Use: Medium Risk (09/29/2022)     Physical Exam  (optional), or other factors deemed appropriate based on the beneficiary's medical and social history and current clinical standards.  Advanced Directives: Does Patient Have a Medical Advance Directive?: No Would patient like information on creating a medical advance directive?: Yes  (MAU/Ambulatory/Procedural Areas - Information given)    Assessment:    This is a routine wellness examination for this patient .   Vision/Hearing screen Hearing Screening   500Hz  1000Hz  2000Hz  4000Hz   Right ear 25 25 25 25   Left ear 25 20 20 20    Vision Screening   Right eye Left eye Both eyes  Without correction 20/30 20/30 20/30   With correction 20/30 20/30 20/30     Dietary issues and exercise activities discussed:      Goals   None    Depression Screen    09/09/2022   11:21 AM 04/30/2022   11:22 AM 12/02/2021    9:59 AM 07/24/2021    9:12 AM  PHQ 2/9 Scores  PHQ -  2 Score 0 1 0 3  PHQ- 9 Score 3 4  8      Fall Risk    09/09/2022   11:21 AM  Fall Risk   Falls in the past year? 0  Number falls in past yr: 0  Injury with Fall? 0  Risk for fall due to : No Fall Risks  Follow up Falls evaluation completed    Cognitive Function:        09/09/2022   11:34 AM  6CIT Screen  What Year? 0 points  What month? 0 points  What time? 3 points  Count back from 20 0 points  Months in reverse 0 points  Repeat phrase 6 points  Total Score 9 points    Patient Care Team: Arnette Felts, FNP as PCP - General (General Practice) Diamantina Providence, FNP as Nurse Practitioner (Nurse Practitioner) Almond Lint, MD as Consulting Physician (General Surgery) Serena Croissant, MD as Consulting Physician (Hematology and Oncology) Antony Blackbird, MD as Consulting Physician (Radiation Oncology) Axel Filler, Larna Daughters, NP as Nurse Practitioner (Hematology and Oncology)     Plan:     I have personally reviewed and noted the following in the patient's chart:   Medical and social history Use of alcohol, tobacco or illicit drugs  Current medications and supplements Functional ability and status Nutritional status Physical activity Advanced directives List of other physicians Hospitalizations, surgeries, and ER visits in previous 12 months Vitals Screenings to include  cognitive, depression, and falls Referrals and appointments  In addition, I have reviewed and discussed with patient certain preventive protocols, quality metrics, and best practice recommendations. A written personalized care plan for preventive services as well as general preventive health recommendations were provided to patient.     Arnette Felts, FNP 10/20/2022

## 2022-09-10 ENCOUNTER — Encounter: Payer: Self-pay | Admitting: Internal Medicine

## 2022-09-10 LAB — LIPID PANEL
Chol/HDL Ratio: 3.9 ratio (ref 0.0–4.4)
Cholesterol, Total: 191 mg/dL (ref 100–199)
HDL: 49 mg/dL (ref >39)
LDL Chol Calc (NIH): 120 mg/dL — ABNORMAL HIGH (ref 0–99)
Triglycerides: 121 mg/dL (ref 0–149)
VLDL Cholesterol Cal: 22 mg/dL (ref 5–40)

## 2022-09-10 LAB — MICROALBUMIN / CREATININE URINE RATIO
Creatinine, Urine: 138.8 mg/dL
Microalb/Creat Ratio: 1185 mg/g creat — ABNORMAL HIGH (ref 0–29)
Microalbumin, Urine: 1645 ug/mL

## 2022-09-10 LAB — HEMOGLOBIN A1C
Est. average glucose Bld gHb Est-mCnc: 100 mg/dL
Hgb A1c MFr Bld: 5.1 % (ref 4.8–5.6)

## 2022-09-11 LAB — URINE CULTURE

## 2022-09-15 ENCOUNTER — Other Ambulatory Visit (HOSPITAL_COMMUNITY): Payer: Self-pay

## 2022-09-15 ENCOUNTER — Telehealth: Payer: Self-pay

## 2022-09-15 MED ORDER — FLUCONAZOLE 100 MG PO TABS: 100.0000 mg | ORAL_TABLET | Freq: Every day | ORAL | 0 refills | Status: AC

## 2022-09-15 NOTE — Progress Notes (Signed)
   Care Guide Note  09/15/2022 Name: [name] MRN: [id number] DOB: 02/22/1966  Referred by: Arnette Felts, FNP Reason for referral : Care Coordination (Outreach to schedule with Pharm d )   [name] is a 57 y.o. year old female who is a primary care patient of Arnette Felts, FNP. [name] was referred to the pharmacist for assistance related to DM.    Successful contact was made with the patient to discuss pharmacy services including being ready for the pharmacist to call at least 5 minutes before the scheduled appointment time, to have medication bottles and any blood sugar or blood pressure readings ready for review. The patient agreed to meet with the pharmacist via with the pharmacist via telephone visit on (date/time).  10/05/2022  Penne Lash, RMA Care Guide [facility]  [place], [place] [address] Direct Dial: [phone] [email or web address]

## 2022-09-16 ENCOUNTER — Other Ambulatory Visit: Payer: Self-pay | Admitting: Nurse Practitioner

## 2022-09-22 ENCOUNTER — Encounter: Payer: Self-pay | Admitting: Nurse Practitioner

## 2022-09-22 DIAGNOSIS — R7309 Other abnormal glucose: Secondary | ICD-10-CM | POA: Insufficient documentation

## 2022-09-29 ENCOUNTER — Ambulatory Visit (INDEPENDENT_AMBULATORY_CARE_PROVIDER_SITE_OTHER): Payer: 59 | Admitting: Physician Assistant

## 2022-09-29 ENCOUNTER — Encounter (INDEPENDENT_AMBULATORY_CARE_PROVIDER_SITE_OTHER): Payer: Self-pay | Admitting: Physician Assistant

## 2022-09-29 ENCOUNTER — Other Ambulatory Visit (HOSPITAL_COMMUNITY): Payer: Self-pay

## 2022-09-29 VITALS — BP 125/88 | HR 122 | Temp 98.1°F | Ht 65.0 in | Wt 227.0 lb

## 2022-09-29 DIAGNOSIS — E559 Vitamin D deficiency, unspecified: Secondary | ICD-10-CM

## 2022-09-29 DIAGNOSIS — Z6837 Body mass index (BMI) 37.0-37.9, adult: Secondary | ICD-10-CM

## 2022-09-29 DIAGNOSIS — K7460 Unspecified cirrhosis of liver: Secondary | ICD-10-CM

## 2022-09-29 DIAGNOSIS — R809 Proteinuria, unspecified: Secondary | ICD-10-CM | POA: Diagnosis not present

## 2022-09-29 DIAGNOSIS — E669 Obesity, unspecified: Secondary | ICD-10-CM

## 2022-09-29 DIAGNOSIS — E1129 Type 2 diabetes mellitus with other diabetic kidney complication: Secondary | ICD-10-CM

## 2022-09-29 DIAGNOSIS — Z7985 Long-term (current) use of injectable non-insulin antidiabetic drugs: Secondary | ICD-10-CM

## 2022-09-29 MED ORDER — TIRZEPATIDE 7.5 MG/0.5ML ~~LOC~~ SOAJ
7.5000 mg | SUBCUTANEOUS | 0 refills | Status: DC
Start: 2022-09-29 — End: 2022-11-04
  Filled 2022-09-29: qty 2, 28d supply, fill #0

## 2022-09-29 NOTE — Progress Notes (Signed)
.smr  Office: [phone]  /  Fax: [phone]  WEIGHT SUMMARY AND BIOMETRICS  Vitals Temp: 98.1 F (36.7 C) BP: 141/88 HAS NOT TAKEN MEDICATIONS YET TODAY Pulse Rate: (!) 122 SpO2: 94 %     Anthropometric Measurements Height: 5\' 5"  (1.651 m) Weight: 227 lb (103 kg) BMI (Calculated): 37.77 Weight at Last Visit: 227 lb Weight Lost Since Last Visit: 0 lb Starting Weight: 241 lb     Body Composition  Body Fat %: 49.8 % Fat Mass (lbs): 113.4 lbs Muscle Mass (lbs): 108.4 lbs Total Body Water (lbs): 84.8 lbs     Other Clinical Data Fasting: no Labs: no Today's Visit #: 15 Starting Date: 07/24/21   HPI  Chief Complaint: OBESITY  [name] is here to discuss her progress with her obesity treatment plan. She is on the the Category 2 Plan and states she is following her eating plan approximately 80 % of the time. She states she is exercising 60 minutes 4 times per week.   Interval History:  Since last office visit she has maintained weight loss  Hunger/appetite-Well controlled. Not skipping meals, but decreased hungerappetite.  Cravings- not excessive Stress- increased some caring for grandchildren this summer/ Moving to new apartment/senior apartment and excited for the change.  Sleep- not restorative Exercise-continues exercise classes 60 minutes 4 times weekly Protein- not always consistently meeting protein goals.   Recent antibiotics for concerns for line infection- Redness resolved and feeling well/at baseline today.  Tachycardic when initially in clinic, but improved as rested- Still tachycardic, but reports has not taken medications- metoprolol or torsemide yet today. Also up significantly in water weight today and discussed importance of taking medications. Reports does not take full dose torsemide most of the time.   Pharmacotherapy: Mounjaro 10 mg weekly. Denies mass in neck, dysphagia, dyspepsia, persistent hoarseness, abdominal pain, or N/V/Constipation or  diarrhea. Has annual eye exam. Mood is stable.  Feels appetite and hunger much lower over past couple on months on increased Mounjaro and also increased tachycardia, although this is likely multifactorial.   TREATMENT PLAN FOR OBESITY:  Recommended Dietary Goals:  [name] is currently in the action stage of change. As such, her goal is to continue weight management plan. She has agreed to the Category 2 Plan.  Behavioral Intervention  We discussed the following Behavioral Modification Strategies today: increasing lean protein intake, decreasing simple carbohydrates , increasing vegetables, increasing lower glycemic fruits, avoiding skipping meals, increasing water intake, continue to practice mindfulness when eating, and planning for success.  Additional resources provided today: NA  Recommended Physical Activity Goals  [name] has been advised to work up to 150 minutes of moderate intensity aerobic activity a week and strengthening exercises 2-3 times per week for cardiovascular health, weight loss maintenance and preservation of muscle mass.   She has agreed to Continue current level of physical activity    Pharmacotherapy We discussed various medication options to help [name] with her weight loss efforts and we both agreed to decrease Mounjaro to 7.5 mg weekly.    Return in about 4 weeks (around 10/27/2022).Marland Kitchen She was informed of the importance of frequent follow up visits to maximize her success with intensive lifestyle modifications for her multiple health conditions.  PHYSICAL EXAM:  Blood pressure 125/88, pulse (!) 122, temperature 98.1 F (36.7 C), height 5\' 5"  (1.651 m), weight 227 lb (103 kg), last menstrual period 05/11/2013, SpO2 94 %. Body mass index is 37.77 kg/m.  General: She is overweight, cooperative, alert, well developed, and  in no acute distress. PSYCH: Has normal mood, affect and thought process.   Cardiovascular: HR recheck 104-108, recheck BP 125/88- Reports  has not yet taken medications today.  Lungs: Normal breathing effort, no conversational dyspnea. Neuro: no focal deficits  DIAGNOSTIC DATA REVIEWED:  BMET    Component Value Date/Time   NA 143 07/13/2022 1156   NA 139 03/31/2017 1332   K 3.7 07/13/2022 1156   K 3.6 03/31/2017 1332   CL 104 07/13/2022 1156   CO2 23 07/13/2022 1156   CO2 26 03/31/2017 1332   GLUCOSE 127 (H) 07/13/2022 1156   GLUCOSE 110 (H) 10/23/2021 1150   GLUCOSE 129 03/31/2017 1332   BUN 15 07/13/2022 1156   BUN 18.1 03/31/2017 1332   CREATININE 0.86 07/13/2022 1156   CREATININE 1.01 (H) 10/23/2021 1150   CREATININE 1.0 03/31/2017 1332   CALCIUM 9.3 07/13/2022 1156   CALCIUM 9.2 03/31/2017 1332   GFRNONAA >60 10/23/2021 1150   GFRAA 83 03/05/2020 1409   Lab Results  Component Value Date   HGBA1C 5.1 09/09/2022   HGBA1C 6.2 (H) 06/09/2013   No results found for: "INSULIN" Lab Results  Component Value Date   TSH 0.82 07/03/2021   CBC    Component Value Date/Time   WBC 6.2 10/23/2021 1150   WBC 6.2 07/03/2021 1138   RBC 3.98 10/23/2021 1150   HGB 12.1 10/23/2021 1150   HGB 11.0 (L) 11/06/2020 1406   HGB 13.4 03/31/2017 1332   HCT 36.0 10/23/2021 1150   HCT 32.7 (L) 11/06/2020 1406   HCT 39.7 03/31/2017 1332   PLT 101 (L) 10/23/2021 1150   PLT 94 (LL) 11/06/2020 1406   MCV 90.5 10/23/2021 1150   MCV 92 11/06/2020 1406   MCV 99.3 03/31/2017 1332   MCH 30.4 10/23/2021 1150   MCHC 33.6 10/23/2021 1150   RDW 13.1 10/23/2021 1150   RDW 13.1 11/06/2020 1406   RDW 14.0 03/31/2017 1332   Iron Studies    Component Value Date/Time   IRON 81 12/23/2020 1045   IRON 33 (L) 06/14/2013 1141   TIBC 366 12/23/2020 1045   TIBC 441 06/14/2013 1141   FERRITIN 160 12/23/2020 1045   FERRITIN 88 06/14/2013 1141   IRONPCTSAT 22 12/23/2020 1045   IRONPCTSAT 16 08/30/2017 1648   Lipid Panel     Component Value Date/Time   CHOL 191 09/09/2022 1250   TRIG 121 09/09/2022 1250   HDL 49 09/09/2022 1250    CHOLHDL 3.9 09/09/2022 1250   CHOLHDL 4 07/03/2021 1138   VLDL 23.6 07/03/2021 1138   LDLCALC 120 (H) 09/09/2022 1250   Hepatic Function Panel     Component Value Date/Time   PROT 6.9 07/13/2022 1156   PROT 7.8 03/31/2017 1332   ALBUMIN 3.8 07/13/2022 1156   ALBUMIN 3.5 03/31/2017 1332   AST 14 07/13/2022 1156   AST 12 (L) 10/23/2021 1150   AST 16 03/31/2017 1332   ALT 8 07/13/2022 1156   ALT 10 10/23/2021 1150   ALT 14 03/31/2017 1332   ALKPHOS 90 07/13/2022 1156   ALKPHOS 96 03/31/2017 1332   BILITOT 0.5 07/13/2022 1156   BILITOT 0.6 10/23/2021 1150   BILITOT 0.97 03/31/2017 1332   BILIDIR 0.2 12/31/2015 1315      Component Value Date/Time   TSH 0.82 07/03/2021 1138   Nutritional Lab Results  Component Value Date   VD25OH 43.0 07/13/2022   VD25OH 30.38 07/03/2021   VD25OH 60.62 10/25/2020    ASSOCIATED CONDITIONS  ADDRESSED TODAY  ASSESSMENT AND PLAN  Problem List Items Addressed This Visit     Type 2 diabetes mellitus with microalbuminuria, without long-term current use of insulin (HCC) - Primary   Relevant Medications   tirzepatide (MOUNJARO) 7.5 MG/0.5ML Pen   BMI 37.0-37.9, adult   Obesity (HCC)-start bmi 40.10   Relevant Medications   tirzepatide (MOUNJARO) 7.5 MG/0.5ML Pen   Hepatic cirrhosis (HCC)   Type 2 Diabetes Mellitus with other specified complication, without long-term current use of insulin HgbA1c is at goal. Last A1c was 5.1 Not on statin therapy due to history of cirrhosis as noted.  Medication(s): Mounjaro 10 mg SQ weekly Some increased HR's with increased dose of Mounjaro over past couple of months, and also decreased hunger/appetite- May be decreasing calories and protein intake. Otherwise no side effects with Mounjaro.  Discussed decreasing dose and will monitor closely   Lab Results  Component Value Date   HGBA1C 5.1 09/09/2022   HGBA1C 5.3 04/30/2022   HGBA1C 5.5 07/03/2021   Lab Results  Component Value Date   MICROALBUR  6.4 07/03/2021   LDLCALC 120 (H) 09/09/2022   CREATININE 0.86 07/13/2022   Lab Results  Component Value Date   GFR 66.99 10/25/2020   GFR 46.84 (L) 01/11/2019   GFR 59.76 (L) 12/22/2018    Plan: Continue and decrease dose Mounjaro 7.5 mg SQ weekly Monitor closely with decreased Mounjaro dosing.  Continue working on nutrition plan to decrease simple carbohydrates, increase lean proteins and exercise to promote weight loss and improve glycemic control .  History of Cirrhosis/MAFLD:   On Spironolactone daily. Not sure consistently taking. No abdominal pain, no gross ascites today. Continues on Mounjaro therapy for management of Type 2 diabetes and should have positive impact on liver function as well.  Plan:  Last Liver enzymes are normal range.   Disease counseling done.   Continue spironolactone and Mounjaro.  Intensive lifestyle modifications are the first line treatment for this issue.  We discussed several lifestyle modifications today and she will continue to work on diet, exercise and weight loss efforts.   Counseling: MAFLD is an umbrella term that encompasses a disease spectrum that includes steatosis (fat) without inflammation, steatohepatitis (NASH; fat + inflammation in a characteristic pattern), and cirrhosis. Bland steatosis is felt to be a benign condition, with extremely low to no risk of progression to cirrhosis, whereas NASH can progress to cirrhosis. The mainstay of treatment of MAFLD includes lifestyle modification to achieve weight loss, at least 7% of current body weight. Low carbohydrate diets can be beneficial in improving MAFLD liver histology. Additionally, exercise, even the absence of weight loss can have beneficial effects on the patient's metabolic profile and liver health. We recommend that their metabolic comorbidities be aggressively managed, as patients with MAFLD are at increased risk of coronary artery disease.   ATTESTASTION STATEMENTS:  Reviewed by  clinician on day of visit: allergies, medications, problem list, medical history, surgical history, family history, social history, and previous encounter notes.   I have personally spent 40 minutes total time today in preparation, patient care, nutritional counseling and documentation for this visit, including the following: review of clinical lab tests; review of medical tests/procedures/services.      Sakari Alkhatib, PA-C

## 2022-09-30 ENCOUNTER — Encounter: Payer: Self-pay | Admitting: Internal Medicine

## 2022-10-03 DIAGNOSIS — K7460 Unspecified cirrhosis of liver: Secondary | ICD-10-CM | POA: Insufficient documentation

## 2022-10-05 ENCOUNTER — Other Ambulatory Visit: Payer: 59 | Admitting: Pharmacist

## 2022-10-08 ENCOUNTER — Other Ambulatory Visit: Payer: 59 | Admitting: Pharmacist

## 2022-10-09 ENCOUNTER — Other Ambulatory Visit: Payer: Self-pay | Admitting: Nurse Practitioner

## 2022-10-09 DIAGNOSIS — E7800 Pure hypercholesterolemia, unspecified: Secondary | ICD-10-CM | POA: Insufficient documentation

## 2022-10-09 DIAGNOSIS — E782 Mixed hyperlipidemia: Secondary | ICD-10-CM | POA: Insufficient documentation

## 2022-10-09 MED ORDER — ATORVASTATIN CALCIUM 20 MG PO TABS: 20.0000 mg | ORAL_TABLET | Freq: Every day | ORAL | 2 refills | Status: DC

## 2022-10-15 ENCOUNTER — Other Ambulatory Visit: Payer: 59 | Admitting: Pharmacist

## 2022-10-15 NOTE — Progress Notes (Signed)
10/15/2022 Name: DESHUNDRA WALLER MRN: [id number] DOB: [birth year]  Chief Complaint  Patient presents with   Medication Management    NAMYA [name] is a 57 y.o. year old female who presented for a telephone visit.   They were referred to the pharmacist by their PCP for assistance in managing diabetes, hypertension, hyperlipidemia, and medication access.    Subjective:  Care Team: Primary Care Provider: Arnette Felts, FNP ; Next Scheduled Visit: 01/11/23  Medication Access/Adherence  Current Pharmacy:  [id number] - [place], [place] - [address] AT [facility] [address] [place] [address] Phone: [phone] Fax: [phone]  [id number] - [place], [place] - [address] AT CORNER OF [address] [place] [address] Phone: [phone] Fax: [phone]  [place] - [facility] [address] [place] [address] Phone: [phone] Fax: [phone]   Patient reports affordability concerns with their medications: Yes  Patient reports access/transportation concerns to their pharmacy: No  Patient reports adherence concerns with their medications:  No    Reports she did have financial concerns with housing previously, but has moved into a new location and this is no longer an issue.    Diabetes and Obesity:  Current medications: Mounjaro 10 mg weekly  Hyperlipidemia/ASCVD Risk Reduction  Current lipid lowering medications: atorvastatin 20 mg daily  Pulmonary HTN:  Current medications: sildenafil 20 mg three times daily, Opsumit 10 mg daily sotatercept 45 mg, spironolactone 50 mg daily, torsemide 20 mg 1-2 daily  Does report increase in headaches after starting sotatercept   Hx Breast Cancer: Current medications: anastrazole started 01/2017, patient notes she discontinued   Gout: Current medications: using allopurinol 300 mg and colchicine PRN  - notes it has been years since her last gout flare   Objective:  Lab Results  Component Value Date   HGBA1C 5.1 09/09/2022    Lab Results  Component Value Date   CREATININE 0.86 07/13/2022   BUN 15 07/13/2022   NA 143 07/13/2022   K 3.7 07/13/2022   CL 104 07/13/2022   CO2 23 07/13/2022    Lab Results  Component Value Date   CHOL 191 09/09/2022   HDL 49 09/09/2022   LDLCALC 120 (H) 09/09/2022   TRIG 121 09/09/2022   CHOLHDL 3.9 09/09/2022    Medications Reviewed Today     Reviewed by Teressa Senter, [name] (Certified Medical Assistant) on 09/29/22 at 1519  Med List Status: <None>   Medication Order Taking? Sig Documenting Provider Last Dose Status Informant  acetaminophen (TYLENOL) 500 MG tablet [id number]  Take 1,000 mg by mouth every 6 (six) hours as needed for moderate pain or headache.  [provider]  Active Self  albuterol (ACCUNEB) 1.25 MG/3ML nebulizer solution [id number]  Take 3 mLs (1.25 mg total) by nebulization every 6 (six) hours as needed for wheezing. Arnette Felts, FNP  Active   albuterol [facility] HFA) 108 [phone] Base) MCG/ACT inhaler [id number]  Inhale 1-2 puffs into the lungs every 6 (six) hours as needed for wheezing or shortness of breath. McLean-Scocuzza, Pasty Spillers, MD  Active   allopurinol (ZYLOPRIM) 300 MG tablet [id number]  TAKE 1 TABLET BY MOUTH EVERY DAY NEED APPT McLean-Scocuzza, Pasty Spillers, MD  Active   anastrozole (ARIMIDEX) 1 MG tablet [id number]  TAKE 1 TABLET BY MOUTH EVERY DAY Serena Croissant, MD  Active   Azelastine HCl 137 MCG/SPRAY SOLN [id number]  PLACE 1 SPRAY INTO BOTH NOSTRILS 2 (  TWO) TIMES DAILY. USE IN EACH NOSTRIL AS DIRECTED Worthy Rancher B, FNP  Active   chlorpheniramine-HYDROcodone (TUSSIONEX) 10-8 MG/5ML [id number]  Take 5 mLs by mouth every 12 (twelve) hours as needed for cough. Arnette Felts, FNP  Active   clotrimazole-betamethasone [facility] Butts) cream [id number]  Apply 1 application topically 2 (two) times daily. Prn leg dermatitis  McLean-Scocuzza, Pasty Spillers, MD  Active   Colchicine (MITIGARE) 0.6 MG CAPS [id number]  Take 1 capsule by mouth as needed. As needed gout flare max # of pills 2 in 24 hours McLean-Scocuzza, Pasty Spillers, MD  Active   fluconazole (DIFLUCAN) 100 MG tablet [id number]  Take 1 tablet (100 mg total) by mouth daily. Take one tablet now and one in 5 days. Arnette Felts, FNP  Active   fluticasone [facility]) 50 MCG/ACT nasal spray [id number]  Place into both nostrils daily as needed for allergies or rhinitis. [provider]  Active Self  Fluticasone-Salmeterol (ADVAIR HFA IN) [id number]  Inhale 1 puff into the lungs as needed. [provider]  Active   heparin lock flush 100 unit/mL [id number]   Serena Croissant, MD  Active   ibuprofen (ADVIL) 800 MG tablet [id number]  Take 1 tablet (800 mg total) by mouth every 8 (eight) hours as needed. McLean-Scocuzza, Pasty Spillers, MD  Active   loratadine (CLARITIN) 10 MG tablet [id number]  Take 1 tablet (10 mg total) by mouth daily. McLean-Scocuzza, Pasty Spillers, MD  Active   Magnesium Cl-Calcium Carbonate (SLOW MAGNESIUM/CALCIUM) 70-117 MG TBEC [id number]  Take 2 tablets by mouth in the morning and at bedtime. Serena Croissant, MD  Active Self  metoprolol tartrate (LOPRESSOR) 25 MG tablet [id number]  TAKE 1 TABLET BY MOUTH TWICE A DAY McLean-Scocuzza, Pasty Spillers, MD  Active   Multiple Vitamin (MULTIVITAMIN) tablet [id number]  Take 1 tablet by mouth daily. [provider]  Active Self  omeprazole (PRILOSEC) 40 MG capsule [id number]  Take 40 mg by mouth as needed. [provider]  Active   OPSUMIT 10 MG TABS [id number]  Take 10 mg by mouth daily. [provider]  Active Self  Polyethyl Glycol-Propyl Glycol (SYSTANE OP) [id number]  Place 1 drop into both eyes 3 (three) times daily as needed (dry/irritated eyes.). [provider]  Active Self  potassium chloride SA (KLOR-CON) 20 MEQ tablet [id number]  Take 1 tablet (20 mEq total) by mouth daily. Serena Croissant, MD   Active Self    Discontinued 08/17/16 0953   rivaroxaban (XARELTO) 20 MG TABS tablet [id number]  Take 20 mg by mouth daily with supper. [provider]  Active Self  sildenafil (REVATIO) 20 MG tablet [id number]  Take 1-2 tablets (20-40 mg total) by mouth 3 (three) times daily. As of 07/03/20 taking 20 mg tid per [facility] Dr. Tresa Res McLean-Scocuzza, Pasty Spillers, MD  Active   sodium chloride flush (NS) 0.9 % injection 10 mL [id number]   Serena Croissant, MD  Active   spironolactone (ALDACTONE) 50 MG tablet [id number]  Take 1 tablet (50 mg total) by mouth daily. McLean-Scocuzza, Pasty Spillers, MD  Active   tirzepatide [facility]) 10 MG/0.5ML Pen [id number]  Inject 10 mg into the skin once a week. Rayburn, Fanny Bien, PA-C  Active   torsemide (DEMADEX) 100 MG tablet [id number]  Take 100 mg by mouth daily.  [provider]  Active Self  treprostinil (REMODULIN) 20 MG/20ML injection [id number]  20 ng/kg/min by Continuous infusion (non-IV) route continuous. [provider]  Active Self  triamcinolone cream (KENALOG) 0.1 % [id number]  Apply 1 application topically daily as needed (leg discoloration). McLean-Scocuzza, Pasty Spillers, MD  Active Self  Vitamin D, Ergocalciferol, (DRISDOL) 1.25 MG (50000 UNIT) CAPS capsule [id number]  Take 1 capsule (50,000 Units total) by mouth every 7 (seven) days. Rayburn, Fanny Bien, PA-C  Active               Lost phone call midway, was unable to reconnect with patient. Will outreach to schedule follow up.   Catie Eppie Gibson, PharmD, BCACP, CPP Clinical Pharmacist [facility] [phone]

## 2022-10-23 ENCOUNTER — Other Ambulatory Visit: Payer: Self-pay

## 2022-10-23 ENCOUNTER — Encounter: Payer: Self-pay | Admitting: Pharmacist

## 2022-10-23 DIAGNOSIS — C50212 Malignant neoplasm of upper-inner quadrant of left female breast: Secondary | ICD-10-CM

## 2022-10-23 DIAGNOSIS — Z170 Estrogen receptor positive status [ER+]: Secondary | ICD-10-CM

## 2022-10-25 NOTE — Progress Notes (Signed)
Patient Care Team: Arnette Felts, FNP as PCP - General (General Practice) Diamantina Providence, FNP as Nurse Practitioner (Nurse Practitioner) Almond Lint, MD as Consulting Physician (General Surgery) Serena Croissant, MD as Consulting Physician (Hematology and Oncology) Antony Blackbird, MD as Consulting Physician (Radiation Oncology) Axel Filler Larna Daughters, NP as Nurse Practitioner (Hematology and Oncology)  DIAGNOSIS: No diagnosis found.  SUMMARY OF ONCOLOGIC HISTORY: Oncology History  Malignant neoplasm of upper-inner quadrant of left breast in female, estrogen receptor positive (HCC)  03/04/2016 Initial Diagnosis   Screening detected left breast density posterior medial 1.4 cm by ultrasound axilla negative, grade 3 IDC ER 60%, PR 0%, Ki-67 30%, HER-2 positive ratio 6.04, copy #16; T1 cN0 stage IA clinical stage   03/21/2016 Genetic Testing   NEgative genetic testing on the comprehensive cancer panel and Negative genetic testing for the MSH2 inversion analysis ([place] inversion). The Comprehensive Cancer Panel offered by [facility] includes sequencing and/or deletion duplication testing of the following 32 genes: APC, ATM, AXIN2, BARD1, BMPR1A, BRCA1, BRCA2, BRIP1, CDH1, CDK4, CDKN2A, CHEK2, EPCAM, FANCC, MLH1, MSH2, MSH6, MUTYH, NBN, PALB2, PMS2, POLD1, POLE, PTEN, RAD51C, RAD51D, SCG5/GREM1, SMAD4, STK11, TP53, VHL, and XRCC2.   The report date is March 21, 2016.   07/27/2016 Surgery   Left lumpectomy: IDC grade 2, 2.6 cm, DCIS intermediate grade,0/3 lymph nodes negative, ER 60%, PR 0%, Ki-67 30%, HER-2 positive ratio 6.04, T2 N0 stage II a   09/01/2016 - 11/03/2016 Chemotherapy   Abraxane Herceptin weekly 12 followed by Herceptin maintenance every 3 weeks     11/19/2016 - 01/20/2017 Radiation Therapy   Adjuvant radiation therapy   01/29/2017 -  Anti-estrogen oral therapy   Anastrozole 2.5 mg daily     CHIEF COMPLIANT: Follow-up of left breast cancer on anastrozole, evaluation of  thrombocytopenia and anemia   INTERVAL HISTORY: [name] is a 57 y.o. with above-mentioned history of left breast cancer treated with lumpectomy, adjuvant chemotherapy, and radiation who is currently on anastrozole therapy. She presents to the clinic today for follow-up.    ALLERGIES:  is allergic to ampicillin, amoxicillin, indomethacin, nsaids, metronidazole, and sulfa antibiotics.  MEDICATIONS:  Current Outpatient Medications  Medication Sig Dispense Refill   acetaminophen (TYLENOL) 500 MG tablet Take 1,000 mg by mouth every 6 (six) hours as needed for moderate pain or headache.      albuterol (ACCUNEB) 1.25 MG/3ML nebulizer solution Take 3 mLs (1.25 mg total) by nebulization every 6 (six) hours as needed for wheezing. 360 mL 12   albuterol (PROAIR HFA) 108 (90 Base) MCG/ACT inhaler Inhale 1-2 puffs into the lungs every 6 (six) hours as needed for wheezing or shortness of breath. 18 g 11   allopurinol (ZYLOPRIM) 300 MG tablet TAKE 1 TABLET BY MOUTH EVERY DAY NEED APPT 90 tablet 3   anastrozole (ARIMIDEX) 1 MG tablet TAKE 1 TABLET BY MOUTH EVERY DAY (Patient not taking: Reported on 10/15/2022) 90 tablet 3   atorvastatin (LIPITOR) 20 MG tablet Take 1 tablet (20 mg total) by mouth daily. (Patient not taking: Reported on 10/15/2022) 30 tablet 2   Azelastine HCl 137 MCG/SPRAY SOLN PLACE 1 SPRAY INTO BOTH NOSTRILS 2 (TWO) TIMES DAILY. USE IN EACH NOSTRIL AS DIRECTED 30 mL 4   chlorpheniramine-HYDROcodone (TUSSIONEX) 10-8 MG/5ML Take 5 mLs by mouth every 12 (twelve) hours as needed for cough. 115 mL 0   clotrimazole-betamethasone (LOTRISONE) cream Apply 1 application topically 2 (two) times daily. Prn leg dermatitis 45 g 0   Colchicine (MITIGARE) 0.6 MG  CAPS Take 1 capsule by mouth as needed. As needed gout flare max # of pills 2 in 24 hours 30 capsule 11   fluticasone (FLONASE) 50 MCG/ACT nasal spray Place into both nostrils daily as needed for allergies or rhinitis.      Fluticasone-Salmeterol (ADVAIR HFA IN) Inhale 1 puff into the lungs as needed.     ibuprofen (ADVIL) 800 MG tablet Take 1 tablet (800 mg total) by mouth every 8 (eight) hours as needed. 90 tablet 1   loratadine (CLARITIN) 10 MG tablet Take 1 tablet (10 mg total) by mouth daily. 90 tablet 3   Magnesium Cl-Calcium Carbonate (SLOW MAGNESIUM/CALCIUM) 70-117 MG TBEC Take 2 tablets by mouth in the morning and at bedtime.     metoprolol tartrate (LOPRESSOR) 25 MG tablet TAKE 1 TABLET BY MOUTH TWICE A DAY 180 tablet 3   Multiple Vitamin (MULTIVITAMIN) tablet Take 1 tablet by mouth daily.     omeprazole (PRILOSEC) 40 MG capsule Take 40 mg by mouth as needed.     OPSUMIT 10 MG TABS Take 10 mg by mouth daily.  8   Polyethyl Glycol-Propyl Glycol (SYSTANE OP) Place 1 drop into both eyes 3 (three) times daily as needed (dry/irritated eyes.).     potassium chloride SA (KLOR-CON) 20 MEQ tablet Take 1 tablet (20 mEq total) by mouth daily.     rivaroxaban (XARELTO) 20 MG TABS tablet Take 20 mg by mouth daily with supper.     sildenafil (REVATIO) 20 MG tablet Take 1-2 tablets (20-40 mg total) by mouth 3 (three) times daily. As of 07/03/20 taking 20 mg tid per [facility] Dr. Tresa Res 10 tablet 0   spironolactone (ALDACTONE) 50 MG tablet Take 1 tablet (50 mg total) by mouth daily. 90 tablet 3   tirzepatide (MOUNJARO) 7.5 MG/0.5ML Pen Inject 7.5 mg into the skin once a week. 2 mL 0   torsemide (DEMADEX) 20 MG tablet Take 20 mg by mouth 2 (two) times daily.     treprostinil (REMODULIN) 20 MG/20ML injection 20 ng/kg/min by Continuous infusion (non-IV) route continuous.     triamcinolone cream (KENALOG) 0.1 % Apply 1 application topically daily as needed (leg discoloration). 454 g 2   Vitamin D, Ergocalciferol, (DRISDOL) 1.25 MG (50000 UNIT) CAPS capsule Take 1 capsule (50,000 Units total) by mouth every 7 (seven) days. 5 capsule 0   WINREVAIR 2 x 45 MG subcutaneous injection Inject into the skin.     No current  facility-administered medications for this visit.   Facility-Administered Medications Ordered in Other Visits  Medication Dose Route Frequency Provider Last Rate Last Admin   heparin lock flush 100 unit/mL  500 Units Intracatheter Once PRN Serena Croissant, MD       sodium chloride flush (NS) 0.9 % injection 10 mL  10 mL Intracatheter PRN Serena Croissant, MD        PHYSICAL EXAMINATION: ECOG PERFORMANCE STATUS: {CHL ONC ECOG VH:[id number]}  There were no vitals filed for this visit. There were no vitals filed for this visit.  BREAST:*** No palpable masses or nodules in either right or left breasts. No palpable axillary supraclavicular or infraclavicular adenopathy no breast tenderness or nipple discharge. (exam performed in the presence of a chaperone)  LABORATORY DATA:  I have reviewed the data as listed    Latest Ref Rng & Units 07/13/2022   11:56 AM 04/30/2022   12:30 PM 10/23/2021   11:50 AM  CMP  Glucose 70 - 99 mg/dL 413   244  BUN 6 - 24 mg/dL 15   22   Creatinine 9.60 - 1.00 mg/dL 4.54   0.98   Sodium 119 - 144 mmol/L 143   143   Potassium 3.5 - 5.2 mmol/L 3.7  3.8  3.8   Chloride 96 - 106 mmol/L 104   106   CO2 20 - 29 mmol/L 23   30   Calcium 8.7 - 10.2 mg/dL 9.3   9.4   Total Protein 6.0 - 8.5 g/dL 6.9   7.7   Total Bilirubin 0.0 - 1.2 mg/dL 0.5   0.6   Alkaline Phos 44 - 121 IU/L 90   81   AST 0 - 40 IU/L 14   12   ALT 0 - 32 IU/L 8   10     Lab Results  Component Value Date   WBC 6.2 10/23/2021   HGB 12.1 10/23/2021   HCT 36.0 10/23/2021   MCV 90.5 10/23/2021   PLT 101 (L) 10/23/2021   NEUTROABS 4.6 10/23/2021    ASSESSMENT & PLAN:  No problem-specific Assessment & Plan notes found for this encounter.    No orders of the defined types were placed in this encounter.  The patient has a good understanding of the overall plan. she agrees with it. she will call with any problems that may develop before the next visit here. Total time spent: 30 mins including  face to face time and time spent for planning, charting and co-ordination of care   Sherlyn Lick, [name] 10/25/22    I Janan Ridge am acting as a Neurosurgeon for [employer]  ***

## 2022-10-26 ENCOUNTER — Inpatient Hospital Stay: Payer: 59 | Attending: Hematology and Oncology

## 2022-10-26 ENCOUNTER — Inpatient Hospital Stay (HOSPITAL_BASED_OUTPATIENT_CLINIC_OR_DEPARTMENT_OTHER): Payer: 59 | Admitting: Hematology and Oncology

## 2022-10-26 ENCOUNTER — Other Ambulatory Visit: Payer: Self-pay

## 2022-10-26 VITALS — BP 157/101 | HR 83 | Temp 97.5°F | Resp 18 | Ht 65.0 in | Wt 230.5 lb

## 2022-10-26 DIAGNOSIS — Z170 Estrogen receptor positive status [ER+]: Secondary | ICD-10-CM | POA: Diagnosis not present

## 2022-10-26 DIAGNOSIS — Z79899 Other long term (current) drug therapy: Secondary | ICD-10-CM | POA: Insufficient documentation

## 2022-10-26 DIAGNOSIS — Z79811 Long term (current) use of aromatase inhibitors: Secondary | ICD-10-CM | POA: Diagnosis not present

## 2022-10-26 DIAGNOSIS — C50212 Malignant neoplasm of upper-inner quadrant of left female breast: Secondary | ICD-10-CM | POA: Diagnosis not present

## 2022-10-26 DIAGNOSIS — D649 Anemia, unspecified: Secondary | ICD-10-CM | POA: Diagnosis not present

## 2022-10-26 LAB — CMP (CANCER CENTER ONLY)
ALT: 9 U/L (ref 0–44)
AST: 11 U/L — ABNORMAL LOW (ref 15–41)
Albumin: 3.8 g/dL (ref 3.5–5.0)
Alkaline Phosphatase: 73 U/L (ref 38–126)
Anion gap: 7 (ref 5–15)
BUN: 14 mg/dL (ref 6–20)
CO2: 29 mmol/L (ref 22–32)
Calcium: 9.4 mg/dL (ref 8.9–10.3)
Chloride: 105 mmol/L (ref 98–111)
Creatinine: 0.68 mg/dL (ref 0.44–1.00)
GFR, Estimated: >60 mL/min (ref >60)
Glucose, Bld: 93 mg/dL (ref 70–99)
Potassium: 3.9 mmol/L (ref 3.5–5.1)
Sodium: 141 mmol/L (ref 135–145)
Total Bilirubin: 0.7 mg/dL (ref 0.3–1.2)
Total Protein: 6.8 g/dL (ref 6.5–8.1)

## 2022-10-26 LAB — CBC WITH DIFFERENTIAL (CANCER CENTER ONLY)
Abs Immature Granulocytes: 0.19 10*3/uL — ABNORMAL HIGH (ref 0.00–0.07)
Basophils Absolute: 0 10*3/uL (ref 0.0–0.1)
Basophils Relative: 1 %
Eosinophils Absolute: 0.2 10*3/uL (ref 0.0–0.5)
Eosinophils Relative: 3 %
HCT: 43.6 % (ref 36.0–46.0)
Hemoglobin: 15.1 g/dL — ABNORMAL HIGH (ref 12.0–15.0)
Immature Granulocytes: 4 %
Lymphocytes Relative: 24 %
Lymphs Abs: 1.3 10*3/uL (ref 0.7–4.0)
MCH: 32 pg (ref 26.0–34.0)
MCHC: 34.6 g/dL (ref 30.0–36.0)
MCV: 92.4 fL (ref 80.0–100.0)
Monocytes Absolute: 0.4 10*3/uL (ref 0.1–1.0)
Monocytes Relative: 7 %
Neutro Abs: 3.4 10*3/uL (ref 1.7–7.7)
Neutrophils Relative %: 61 %
Platelet Count: 123 10*3/uL — ABNORMAL LOW (ref 150–400)
RBC: 4.72 MIL/uL (ref 3.87–5.11)
RDW: 13.7 % (ref 11.5–15.5)
WBC Count: 5.5 10*3/uL (ref 4.0–10.5)
nRBC: 0.4 % — ABNORMAL HIGH (ref 0.0–0.2)

## 2022-10-26 NOTE — Assessment & Plan Note (Addendum)
Left lumpectomy 07/28/2016: IDC grade 2, 2.6 cm, DCIS intermediate grade,0/3 lymph nodes negative, ER 60%, PR 0%, Ki-67 30%, HER-2 positive ratio 6.04, T2 N0 stage II a   Treatment summary: 1. Adjuvant chemotherapy with Abraxane and Herceptin (cannot take Taxol because she could not receive steroids) weekly 12 followed by every 3 week Herceptin for one year until May 2019 2. followed by adjuvant radiation 11/25/2016-01/05/2017  3. Followed by adjuvant antiestrogen therapy started 01/28/2017 ------------------------------------------------------------------------------------------------------------------------------- Treatment plan: Adjuvant antiestrogen therapy with anastrozole 1 mg daily 01/28/2017 Adjuvant Herceptin completed May 2019   Anastrozole toxicities: Denies any hot flashes arthralgias or myalgias. Pulmonary hypertension: This is probably her biggest problem currently.  Uses 24-hour oxygen Recurrent gout   Surveillance: 1. Breast exam 10/26/2022: Benign. Scar tissue in the breast: It is formed into a big knot.  She would like it removed.  I sent a referral to Dr. Leta Baptist. 2. mammograms 03/24/2022 at [facility] benign: Breast density category B   Pulmonary hypertension: Seeing pulmonologist at [facility], uses oxygen 24 / 7 On Mounjaro: Lost 20 pounds and since then her breathing has improved.  Thrombocytopenia:   Lab review: 11/06/2020: Platelets 94 07/03/2021: Platelets 124 10/26/2022: Platelets 123   Return to clinic in 1 year for follow-up with labs and after that she could be seen on an as-needed basis.

## 2022-10-28 ENCOUNTER — Ambulatory Visit (INDEPENDENT_AMBULATORY_CARE_PROVIDER_SITE_OTHER): Payer: 59 | Admitting: Physician Assistant

## 2022-11-04 ENCOUNTER — Ambulatory Visit (INDEPENDENT_AMBULATORY_CARE_PROVIDER_SITE_OTHER): Payer: 59 | Admitting: Physician Assistant

## 2022-11-04 ENCOUNTER — Other Ambulatory Visit (HOSPITAL_COMMUNITY): Payer: Self-pay

## 2022-11-04 ENCOUNTER — Encounter: Payer: Self-pay | Admitting: Internal Medicine

## 2022-11-04 ENCOUNTER — Encounter (INDEPENDENT_AMBULATORY_CARE_PROVIDER_SITE_OTHER): Payer: Self-pay | Admitting: Physician Assistant

## 2022-11-04 VITALS — BP 151/99 | HR 82 | Temp 98.1°F | Ht 65.0 in | Wt 225.0 lb

## 2022-11-04 DIAGNOSIS — E1129 Type 2 diabetes mellitus with other diabetic kidney complication: Secondary | ICD-10-CM | POA: Diagnosis not present

## 2022-11-04 DIAGNOSIS — I10 Essential (primary) hypertension: Secondary | ICD-10-CM

## 2022-11-04 DIAGNOSIS — E559 Vitamin D deficiency, unspecified: Secondary | ICD-10-CM | POA: Diagnosis not present

## 2022-11-04 DIAGNOSIS — Z6837 Body mass index (BMI) 37.0-37.9, adult: Secondary | ICD-10-CM

## 2022-11-04 DIAGNOSIS — I50812 Chronic right heart failure: Secondary | ICD-10-CM

## 2022-11-04 DIAGNOSIS — Z7985 Long-term (current) use of injectable non-insulin antidiabetic drugs: Secondary | ICD-10-CM

## 2022-11-04 DIAGNOSIS — R5383 Other fatigue: Secondary | ICD-10-CM | POA: Diagnosis not present

## 2022-11-04 DIAGNOSIS — R809 Proteinuria, unspecified: Secondary | ICD-10-CM

## 2022-11-04 DIAGNOSIS — E785 Hyperlipidemia, unspecified: Secondary | ICD-10-CM

## 2022-11-04 DIAGNOSIS — H539 Unspecified visual disturbance: Secondary | ICD-10-CM

## 2022-11-04 DIAGNOSIS — E669 Obesity, unspecified: Secondary | ICD-10-CM

## 2022-11-04 MED ORDER — VITAMIN D (ERGOCALCIFEROL) 1.25 MG (50000 UNIT) PO CAPS
50000.0000 [IU] | ORAL_CAPSULE | ORAL | 0 refills | Status: DC
Start: 2022-11-04 — End: 2022-11-04

## 2022-11-04 MED ORDER — TIRZEPATIDE 7.5 MG/0.5ML ~~LOC~~ SOAJ
7.5000 mg | SUBCUTANEOUS | 0 refills | Status: DC
Start: 2022-11-04 — End: 2022-11-04

## 2022-11-04 MED ORDER — VITAMIN D (ERGOCALCIFEROL) 1.25 MG (50000 UNIT) PO CAPS
50000.0000 [IU] | ORAL_CAPSULE | ORAL | 0 refills | Status: DC
Start: 2022-11-04 — End: 2023-05-13
  Filled 2022-11-04: qty 5, 35d supply, fill #0

## 2022-11-04 MED ORDER — TIRZEPATIDE 7.5 MG/0.5ML ~~LOC~~ SOAJ
7.5000 mg | SUBCUTANEOUS | 0 refills | Status: DC
Start: 2022-11-04 — End: 2022-11-26
  Filled 2022-11-04 – 2022-11-05 (×2): qty 2, 28d supply, fill #0

## 2022-11-04 NOTE — Progress Notes (Signed)
.[facility]  Office: [phone]  /  Fax: [phone]  WEIGHT SUMMARY AND BIOMETRICS  Vitals Temp: 98.1 F (36.7 C) BP: (!) 151/99 Pulse Rate: 82 SpO2: 97 %   Anthropometric Measurements Height: 5\' 5"  (1.651 m) Weight: 225 lb (102.1 kg) BMI (Calculated): 37.44 Weight at Last Visit: 227 lb Weight Lost Since Last Visit: 2 lb Weight Gained Since Last Visit: 0 lb Starting Weight: 241 lb Total Weight Loss (lbs): 16 lb (7.258 kg) Peak Weight: 254 lb   Body Composition  Body Fat %: 48.6 % Fat Mass (lbs): 109.8 lbs Muscle Mass (lbs): 110.2 lbs Total Body Water (lbs): 81.4 lbs Visceral Fat Rating : 14   Other Clinical Data Fasting: yes Labs: no Today's Visit #: 16 Starting Date: 07/24/21     HPI  Chief Complaint: OBESITY  [name] is here to discuss her progress with her obesity treatment plan. She is on the the Category 2 Plan and states she is following her eating plan approximately 85 %  of the time. She states she is exercising Silver sneakers 60 minutes 3 times per week.  Discussed the use of AI scribe software for clinical note transcription with the patient, who gave verbal consent to proceed.  History of Present Illness    Interval History:  Since last office visit she down 2 lbs   The patient, with a history of obesity, type 2 diabetes, non-alcoholic fatty liver disease, pulmonary artery hypertension, chronic hypoxemic respiratory failure, and chronic right-sided heart failure, presents for a follow-up of her obesity treatment. She reports an increase in muscle mass and a decrease in adipose tissue, which she attributes to a focused effort on strength training. She also reports some vision changes, but it is unclear if this is related to the medication or her new glasses. She is also on Torsimide as needed for water weight, which has decreased. She has been mindful of her salt intake and has seen a decrease in swelling. Her blood pressure has been consistently high,  and she plans to follow up with her heart doctor.  Pharmacotherapy: Mounjaro 10 mg weekly. Denies mass in neck, dysphagia, dyspepsia, persistent hoarseness, abdominal pain, or N/V/Constipation or diarrhea. Has annual eye exam. Mood is stable.  Feels appetite and hunger much lower over past couple on months on increased Mounjaro and also increased tachycardia, although this is likely multifactorial.   TREATMENT PLAN FOR OBESITY: Obesity Continued weight loss with increased muscle mass and decreased adipose tissue. Patient is on Mounjaro 7.5mg  with no reported side effects. -Continue Mounjaro 7.5mg  daily. -Encourage continued focus on strength training and diet modification. -Return in 3 weeks for follow-up. Recommended Dietary Goals  [name] is currently in the action stage of change. As such, her goal is to continue weight management plan. She has agreed to the Category 2 Plan.  Behavioral Intervention  We discussed the following Behavioral Modification Strategies today: increasing lean protein intake, decreasing simple carbohydrates , increasing vegetables, increasing lower glycemic fruits, increasing fiber rich foods, avoiding skipping meals, increasing water intake, decreasing eating out or consumption of processed foods, and making healthy choices when eating convenient foods, emotional eating strategies and understanding the difference between hunger signals and cravings, work on managing stress, creating time for self-care and relaxation measures, continue to practice mindfulness when eating, and planning for success.  Additional resources provided today: NA  Recommended Physical Activity Goals  [name] has been advised to work up to 150 minutes of moderate intensity aerobic activity a week and strengthening exercises  2-3 times per week for cardiovascular health, weight loss maintenance and preservation of muscle mass.   She has agreed to Continue current level of physical activity  and  Think about ways to increase daily physical activity and overcoming barriers to exercise   Pharmacotherapy We discussed various medication options to help [facility] with her weight loss efforts and we both agreed to continue Mounjaro 7.5 mg weekly for Type 2 diabetes management.    Return in about 3 weeks (around 11/25/2022).Marland Kitchen She was informed of the importance of frequent follow up visits to maximize her success with intensive lifestyle modifications for her multiple health conditions.  PHYSICAL EXAM:  Blood pressure (!) 151/99, pulse 82, temperature 98.1 F (36.7 C), height 5\' 5"  (1.651 m), weight 225 lb (102.1 kg), last menstrual period 05/11/2013, SpO2 97%. Body mass index is 37.44 kg/m.  General: She is overweight, cooperative, alert, well developed, and in no acute distress. PSYCH: Has normal mood, affect and thought process.  Cardiovascular: HR 80's , BP 151/99- Reports has not taken medication yet today.   Lungs: Normal breathing effort. No dyspnea with general conversation.  On chronic oxygen by nasal cannula.   DIAGNOSTIC DATA REVIEWED:  BMET    Component Value Date/Time   NA 141 10/26/2022 1058   NA 143 07/13/2022 1156   NA 139 03/31/2017 1332   K 3.9 10/26/2022 1058   K 3.6 03/31/2017 1332   CL 105 10/26/2022 1058   CO2 29 10/26/2022 1058   CO2 26 03/31/2017 1332   GLUCOSE 93 10/26/2022 1058   GLUCOSE 129 03/31/2017 1332   BUN 14 10/26/2022 1058   BUN 15 07/13/2022 1156   BUN 18.1 03/31/2017 1332   CREATININE 0.68 10/26/2022 1058   CREATININE 1.0 03/31/2017 1332   CALCIUM 9.4 10/26/2022 1058   CALCIUM 9.2 03/31/2017 1332   GFRNONAA >60 10/26/2022 1058   GFRAA 83 03/05/2020 1409   Lab Results  Component Value Date   HGBA1C 5.1 09/09/2022   HGBA1C 6.2 (H) 06/09/2013   No results found for: "INSULIN" Lab Results  Component Value Date   TSH 0.82 07/03/2021   CBC    Component Value Date/Time   WBC 5.5 10/26/2022 1058   WBC 6.2 07/03/2021 1138   RBC 4.72  10/26/2022 1058   HGB 15.1 (H) 10/26/2022 1058   HGB 11.0 (L) 11/06/2020 1406   HGB 13.4 03/31/2017 1332   HCT 43.6 10/26/2022 1058   HCT 32.7 (L) 11/06/2020 1406   HCT 39.7 03/31/2017 1332   PLT 123 (L) 10/26/2022 1058   PLT 94 (LL) 11/06/2020 1406   MCV 92.4 10/26/2022 1058   MCV 92 11/06/2020 1406   MCV 99.3 03/31/2017 1332   MCH 32.0 10/26/2022 1058   MCHC 34.6 10/26/2022 1058   RDW 13.7 10/26/2022 1058   RDW 13.1 11/06/2020 1406   RDW 14.0 03/31/2017 1332   Iron Studies    Component Value Date/Time   IRON 81 12/23/2020 1045   IRON 33 (L) 06/14/2013 1141   TIBC 366 12/23/2020 1045   TIBC 441 06/14/2013 1141   FERRITIN 160 12/23/2020 1045   FERRITIN 88 06/14/2013 1141   IRONPCTSAT 22 12/23/2020 1045   IRONPCTSAT 16 08/30/2017 1648   Lipid Panel     Component Value Date/Time   CHOL 191 09/09/2022 1250   TRIG 121 09/09/2022 1250   HDL 49 09/09/2022 1250   CHOLHDL 3.9 09/09/2022 1250   CHOLHDL 4 07/03/2021 1138   VLDL 23.6 07/03/2021 1138  LDLCALC 120 (H) 09/09/2022 1250   Hepatic Function Panel     Component Value Date/Time   PROT 6.8 10/26/2022 1058   PROT 6.9 07/13/2022 1156   PROT 7.8 03/31/2017 1332   ALBUMIN 3.8 10/26/2022 1058   ALBUMIN 3.8 07/13/2022 1156   ALBUMIN 3.5 03/31/2017 1332   AST 11 (L) 10/26/2022 1058   AST 16 03/31/2017 1332   ALT 9 10/26/2022 1058   ALT 14 03/31/2017 1332   ALKPHOS 73 10/26/2022 1058   ALKPHOS 96 03/31/2017 1332   BILITOT 0.7 10/26/2022 1058   BILITOT 0.97 03/31/2017 1332   BILIDIR 0.2 12/31/2015 1315      Component Value Date/Time   TSH 0.82 07/03/2021 1138   Nutritional Lab Results  Component Value Date   VD25OH 43.0 07/13/2022   VD25OH 30.38 07/03/2021   VD25OH 60.62 10/25/2020    ASSOCIATED CONDITIONS ADDRESSED TODAY  ASSESSMENT AND PLAN  Problem List Items Addressed This Visit     Type 2 diabetes mellitus with microalbuminuria, without long-term current use of insulin (HCC) - Primary   Relevant  Medications   tirzepatide (MOUNJARO) 7.5 MG/0.5ML Pen   Other Relevant Orders   Hemoglobin A1c   Vitamin D deficiency   Relevant Medications   Vitamin D, Ergocalciferol, (DRISDOL) 1.25 MG (50000 UNIT) CAPS capsule   Other Relevant Orders   VITAMIN D 25 Hydroxy (Vit-D Deficiency, Fractures)   Obesity (HCC)-start bmi 40.10   Relevant Medications   tirzepatide (MOUNJARO) 7.5 MG/0.5ML Pen   Other Visit Diagnoses     Other fatigue       Relevant Orders   Vitamin B12     Patient plans to have labs drawn over the next couple of weeks at [facility].  Orders for appropriate labs entered into system.   Vitamin D deficiency Patient is on weekly Vitamin D supplementation. Last level check was a while ago. -Check Vitamin D level at next lab visit. -Continue weekly Vitamin D supplementation.  Type 2 Diabetes No recent A1c check. -Check A1c at next lab visit.  B12 deficiency No recent B12 level check. -Check B12 level at next lab visit.  Hypertension Patient reports consistently elevated blood pressure readings at home. -Advise patient to keep a log of home blood pressure readings. -Recommend follow-up with cardiologist.  Chronic Right-Sided Heart Failure Patient reports decreased edema and is taking Torsimide 20mg  PRN. -Continue Torsimide 20mg  PRN. -Advise patient to monitor for increased edema and follow up with cardiology.  Hyperlipidemia Patient is not taking prescribed Lipitor and prefers to manage cholesterol levels with diet. -Encourage continued dietary modifications. -Consider rechecking lipid panel in a few months.  Vision changes Patient reports some difficulty with vision but has recently had an eye exam. -Advise patient to follow up with eye doctor if vision changes persist.  ATTESTASTION STATEMENTS:  Reviewed by clinician on day of visit: allergies, medications, problem list, medical history, surgical history, family history, social history, and previous  encounter notes.   I have personally spent 44 minutes total time today in preparation, patient care, nutritional counseling and documentation for this visit, including the following: review of clinical lab tests; review of medical tests/procedures/services.       , PA-C

## 2022-11-05 ENCOUNTER — Telehealth: Payer: Self-pay | Admitting: Nurse Practitioner

## 2022-11-05 ENCOUNTER — Other Ambulatory Visit (HOSPITAL_COMMUNITY): Payer: Self-pay

## 2022-11-09 ENCOUNTER — Telehealth (INDEPENDENT_AMBULATORY_CARE_PROVIDER_SITE_OTHER): Payer: Self-pay | Admitting: Physician Assistant

## 2022-11-09 NOTE — Telephone Encounter (Signed)
Pt called stating that she only received one month's refill of Mounjaro, when she normally receives three. Pt would like two additional months of the RX to be sent to her pharmacy. Please call patient at the number on file.

## 2022-11-10 NOTE — Telephone Encounter (Signed)
Spoke with the patient and will discuss further at next office visit on 11/26/2022.

## 2022-11-26 ENCOUNTER — Ambulatory Visit (INDEPENDENT_AMBULATORY_CARE_PROVIDER_SITE_OTHER): Payer: 59 | Admitting: Physician Assistant

## 2022-11-26 ENCOUNTER — Telehealth (INDEPENDENT_AMBULATORY_CARE_PROVIDER_SITE_OTHER): Payer: Self-pay | Admitting: Family Medicine

## 2022-11-26 ENCOUNTER — Encounter (INDEPENDENT_AMBULATORY_CARE_PROVIDER_SITE_OTHER): Payer: Self-pay | Admitting: Family Medicine

## 2022-11-26 ENCOUNTER — Other Ambulatory Visit (HOSPITAL_COMMUNITY): Payer: Self-pay

## 2022-11-26 ENCOUNTER — Ambulatory Visit (INDEPENDENT_AMBULATORY_CARE_PROVIDER_SITE_OTHER): Payer: 59 | Admitting: Family Medicine

## 2022-11-26 VITALS — BP 137/88 | HR 95 | Temp 98.0°F | Ht 65.0 in | Wt 221.0 lb

## 2022-11-26 DIAGNOSIS — E119 Type 2 diabetes mellitus without complications: Secondary | ICD-10-CM | POA: Diagnosis not present

## 2022-11-26 DIAGNOSIS — Z7985 Long-term (current) use of injectable non-insulin antidiabetic drugs: Secondary | ICD-10-CM

## 2022-11-26 DIAGNOSIS — E669 Obesity, unspecified: Secondary | ICD-10-CM

## 2022-11-26 DIAGNOSIS — E559 Vitamin D deficiency, unspecified: Secondary | ICD-10-CM

## 2022-11-26 DIAGNOSIS — R809 Proteinuria, unspecified: Secondary | ICD-10-CM

## 2022-11-26 DIAGNOSIS — Z6836 Body mass index (BMI) 36.0-36.9, adult: Secondary | ICD-10-CM | POA: Diagnosis not present

## 2022-11-26 MED ORDER — TIRZEPATIDE 7.5 MG/0.5ML ~~LOC~~ SOAJ
7.5000 mg | SUBCUTANEOUS | 0 refills | Status: DC
  Filled 2022-11-26: qty 2, 28d supply, fill #0

## 2022-11-26 NOTE — Progress Notes (Signed)
.smr  Office: [phone]  /  Fax: [phone]  WEIGHT SUMMARY AND BIOMETRICS  Anthropometric Measurements Height: 5\' 5"  (1.651 m) Weight: 221 lb (100.2 kg) BMI (Calculated): 36.78 Weight at Last Visit: 225 lb Weight Lost Since Last Visit: 4 lb Weight Gained Since Last Visit: 0 Starting Weight: 241 lb Total Weight Loss (lbs): 20 lb (9.072 kg)   Body Composition  Body Fat %: 47.9 % Fat Mass (lbs): 106 lbs Muscle Mass (lbs): 109.6 lbs Total Body Water (lbs): 78.4 lbs Visceral Fat Rating : 14   Other Clinical Data Fasting: No Labs: No Today's Visit #: 17 Starting Date: 07/24/21    Chief Complaint: OBESITY   History of Present Illness   The patient, with a history of obesity and diabetes, has been following a category two diet plan 85% of the time and has lost four pounds in the last three weeks. She has been exercising for about 60 minutes three to four times per week through a program called [employer]. She reports no issues with hunger and is not skipping meals, but finds meeting protein goals to be a challenge at times. She has been adhering to the category two diet plan, occasionally finding it tiresome but managing to switch it around, particularly for lunch. She has been leaning towards having brunch instead of breakfast and dinner, with a larger snack in between.  The patient is on Mounjaro 7.5 mg per week for blood pressure control, which is not causing any gastrointestinal issues. She does not check her blood sugars at home. Her most recent A1c was 5.2, indicating good diabetes control. She is also on a weekly vitamin D supplement, which she has been taking for a while. Her vitamin D levels have decreased from 43 this spring to 30, which is below the desired range of 50-60. She has not been experiencing any cravings or fatigue, which could be symptoms of low vitamin D levels.  The patient has been trying to incorporate a protein drink into her diet, particularly  for post-workout nutrition, but has found it unpalatable. She prefers to consume her protein from real food sources. She has been experimenting with different lunch ideas that meet her calorie and protein goals, primarily preparing meals at home for better control over the ingredients and taste.          PHYSICAL EXAM:  Blood pressure 137/88, pulse 95, temperature 98 F (36.7 C), height 5\' 5"  (1.651 m), weight 221 lb (100.2 kg), last menstrual period 05/11/2013, SpO2 93%. Body mass index is 36.78 kg/m.  DIAGNOSTIC DATA REVIEWED:  BMET    Component Value Date/Time   NA 141 10/26/2022 1058   NA 143 07/13/2022 1156   NA 139 03/31/2017 1332   K 3.9 10/26/2022 1058   K 3.6 03/31/2017 1332   CL 105 10/26/2022 1058   CO2 29 10/26/2022 1058   CO2 26 03/31/2017 1332   GLUCOSE 93 10/26/2022 1058   GLUCOSE 129 03/31/2017 1332   BUN 14 10/26/2022 1058   BUN 15 07/13/2022 1156   BUN 18.1 03/31/2017 1332   CREATININE 0.68 10/26/2022 1058   CREATININE 1.0 03/31/2017 1332   CALCIUM 9.4 10/26/2022 1058   CALCIUM 9.2 03/31/2017 1332   GFRNONAA >60 10/26/2022 1058   GFRAA 83 03/05/2020 1409   Lab Results  Component Value Date   HGBA1C 5.2 11/12/2022   HGBA1C 6.2 (H) 06/09/2013   No results found for: "INSULIN" Lab Results  Component Value Date   TSH 0.82 07/03/2021  CBC    Component Value Date/Time   WBC 5.5 10/26/2022 1058   WBC 6.2 07/03/2021 1138   RBC 4.72 10/26/2022 1058   HGB 15.1 (H) 10/26/2022 1058   HGB 11.0 (L) 11/06/2020 1406   HGB 13.4 03/31/2017 1332   HCT 43.6 10/26/2022 1058   HCT 32.7 (L) 11/06/2020 1406   HCT 39.7 03/31/2017 1332   PLT 123 (L) 10/26/2022 1058   PLT 94 (LL) 11/06/2020 1406   MCV 92.4 10/26/2022 1058   MCV 92 11/06/2020 1406   MCV 99.3 03/31/2017 1332   MCH 32.0 10/26/2022 1058   MCHC 34.6 10/26/2022 1058   RDW 13.7 10/26/2022 1058   RDW 13.1 11/06/2020 1406   RDW 14.0 03/31/2017 1332   Iron Studies    Component Value Date/Time    IRON 81 12/23/2020 1045   IRON 33 (L) 06/14/2013 1141   TIBC 366 12/23/2020 1045   TIBC 441 06/14/2013 1141   FERRITIN 160 12/23/2020 1045   FERRITIN 88 06/14/2013 1141   IRONPCTSAT 22 12/23/2020 1045   IRONPCTSAT 16 08/30/2017 1648   Lipid Panel     Component Value Date/Time   CHOL 191 09/09/2022 1250   TRIG 121 09/09/2022 1250   HDL 49 09/09/2022 1250   CHOLHDL 3.9 09/09/2022 1250   CHOLHDL 4 07/03/2021 1138   VLDL 23.6 07/03/2021 1138   LDLCALC 120 (H) 09/09/2022 1250   Hepatic Function Panel     Component Value Date/Time   PROT 6.8 10/26/2022 1058   PROT 6.9 07/13/2022 1156   PROT 7.8 03/31/2017 1332   ALBUMIN 3.8 10/26/2022 1058   ALBUMIN 3.8 07/13/2022 1156   ALBUMIN 3.5 03/31/2017 1332   AST 11 (L) 10/26/2022 1058   AST 16 03/31/2017 1332   ALT 9 10/26/2022 1058   ALT 14 03/31/2017 1332   ALKPHOS 73 10/26/2022 1058   ALKPHOS 96 03/31/2017 1332   BILITOT 0.7 10/26/2022 1058   BILITOT 0.97 03/31/2017 1332   BILIDIR 0.2 12/31/2015 1315      Component Value Date/Time   TSH 0.82 07/03/2021 1138   Nutritional Lab Results  Component Value Date   VD25OH 30.9 11/12/2022   VD25OH 43.0 07/13/2022   VD25OH 30.38 07/03/2021     Assessment and Plan    Obesity 4-pound weight loss in the last three weeks. Adherence to category two plan 85% of the time. Regular exercise with [employer] program. -Continue current diet and exercise regimen. -Consider using ChatGPT app for meal planning ideas.  Type 2 Diabetes Well-controlled with Mounjaro 7.5 mg per week. Recent A1c was 5.2. -Continue Mounjaro 7.5 mg per week. -No need for home blood glucose monitoring at this time.  Vitamin D Deficiency Recent level was 30, down from 43 in the spring. Currently on weekly vitamin D supplement. -Continue current vitamin D supplement. -Check vitamin D level later in the fall.  Follow-up in 3-4 weeks.       She was informed of the importance of frequent follow up visits  to maximize her success with intensive lifestyle modifications for her multiple health conditions.    Quillian Quince, MD

## 2022-11-26 NOTE — Telephone Encounter (Signed)
Hi! The patient stated that she is going to run out of medication before her appointment. She would like two supply refills instead of one. Please call once completed.

## 2022-12-01 ENCOUNTER — Other Ambulatory Visit (HOSPITAL_COMMUNITY): Payer: Self-pay

## 2022-12-01 ENCOUNTER — Other Ambulatory Visit: Payer: 59

## 2022-12-01 ENCOUNTER — Other Ambulatory Visit: Payer: Self-pay | Admitting: Nurse Practitioner

## 2022-12-01 DIAGNOSIS — Z79899 Other long term (current) drug therapy: Secondary | ICD-10-CM

## 2022-12-01 DIAGNOSIS — E7800 Pure hypercholesterolemia, unspecified: Secondary | ICD-10-CM

## 2022-12-02 ENCOUNTER — Other Ambulatory Visit (INDEPENDENT_AMBULATORY_CARE_PROVIDER_SITE_OTHER): Payer: Self-pay | Admitting: Physician Assistant

## 2022-12-02 DIAGNOSIS — E1129 Type 2 diabetes mellitus with other diabetic kidney complication: Secondary | ICD-10-CM

## 2022-12-02 LAB — CMP14+EGFR
ALT: 13 IU/L (ref 0–32)
AST: 14 IU/L (ref 0–40)
Albumin: 3.9 g/dL (ref 3.8–4.9)
Alkaline Phosphatase: 106 IU/L (ref 44–121)
BUN/Creatinine Ratio: 25 — ABNORMAL HIGH (ref 9–23)
BUN: 19 mg/dL (ref 6–24)
Bilirubin Total: 0.7 mg/dL (ref 0.0–1.2)
CO2: 24 mmol/L (ref 20–29)
Calcium: 9.3 mg/dL (ref 8.7–10.2)
Chloride: 100 mmol/L (ref 96–106)
Creatinine, Ser: 0.77 mg/dL (ref 0.57–1.00)
Globulin, Total: 3.1 g/dL (ref 1.5–4.5)
Glucose: 90 mg/dL (ref 70–99)
Potassium: 3.3 mmol/L — ABNORMAL LOW (ref 3.5–5.2)
Sodium: 143 mmol/L (ref 134–144)
Total Protein: 7 g/dL (ref 6.0–8.5)
eGFR: 90 mL/min/{1.73_m2} (ref >59)

## 2022-12-02 MED ORDER — TIRZEPATIDE 7.5 MG/0.5ML ~~LOC~~ SOAJ
7.5000 mg | SUBCUTANEOUS | 1 refills | Status: DC
Start: 2022-12-02 — End: 2022-12-30

## 2022-12-09 ENCOUNTER — Other Ambulatory Visit (HOSPITAL_COMMUNITY): Payer: Self-pay

## 2022-12-10 LAB — HM DIABETES EYE EXAM

## 2022-12-30 ENCOUNTER — Ambulatory Visit (INDEPENDENT_AMBULATORY_CARE_PROVIDER_SITE_OTHER): Payer: 59 | Admitting: Physician Assistant

## 2022-12-30 ENCOUNTER — Other Ambulatory Visit (HOSPITAL_COMMUNITY): Payer: Self-pay

## 2022-12-30 ENCOUNTER — Encounter (INDEPENDENT_AMBULATORY_CARE_PROVIDER_SITE_OTHER): Payer: Self-pay | Admitting: Physician Assistant

## 2022-12-30 VITALS — BP 131/88 | HR 96 | Temp 98.3°F | Ht 65.0 in | Wt 223.0 lb

## 2022-12-30 DIAGNOSIS — R809 Proteinuria, unspecified: Secondary | ICD-10-CM

## 2022-12-30 DIAGNOSIS — E559 Vitamin D deficiency, unspecified: Secondary | ICD-10-CM

## 2022-12-30 DIAGNOSIS — I272 Pulmonary hypertension, unspecified: Secondary | ICD-10-CM | POA: Diagnosis not present

## 2022-12-30 DIAGNOSIS — E1129 Type 2 diabetes mellitus with other diabetic kidney complication: Secondary | ICD-10-CM | POA: Diagnosis not present

## 2022-12-30 DIAGNOSIS — Z7985 Long-term (current) use of injectable non-insulin antidiabetic drugs: Secondary | ICD-10-CM

## 2022-12-30 DIAGNOSIS — E876 Hypokalemia: Secondary | ICD-10-CM

## 2022-12-30 DIAGNOSIS — Z6837 Body mass index (BMI) 37.0-37.9, adult: Secondary | ICD-10-CM

## 2022-12-30 MED ORDER — TIRZEPATIDE 7.5 MG/0.5ML ~~LOC~~ SOAJ
7.5000 mg | SUBCUTANEOUS | 2 refills | Status: DC
  Filled 2022-12-30 – 2023-02-09 (×6): qty 2, 28d supply, fill #0
  Filled 2023-02-09: qty 2, 28d supply, fill #1

## 2022-12-30 MED ORDER — FLUCONAZOLE 150 MG PO TABS
150.0000 mg | ORAL_TABLET | Freq: Once | ORAL | 0 refills | Status: AC
  Filled 2022-12-30: qty 1, 1d supply, fill #0

## 2022-12-30 MED ORDER — DOXYCYCLINE HYCLATE 100 MG PO TABS
100.0000 mg | ORAL_TABLET | Freq: Two times a day (BID) | ORAL | 0 refills | Status: AC
  Filled 2022-12-30: qty 14, 7d supply, fill #0

## 2022-12-30 NOTE — Progress Notes (Signed)
.smr  Office: [phone]  /  Fax: [phone]  WEIGHT SUMMARY AND BIOMETRICS  Vitals Temp: 98.3 F (36.8 C) BP: 131/88 Pulse Rate: 96 SpO2: 95 %   Anthropometric Measurements Height: 5\' 5"  (1.651 m) Weight: 223 lb (101.2 kg) BMI (Calculated): 37.11 Weight at Last Visit: 221 lb Weight Lost Since Last Visit: 0 Weight Gained Since Last Visit: 2 lb Starting Weight: 241 lb Total Weight Loss (lbs): 18 lb (8.165 kg) Peak Weight: 254 lb   Body Composition  Body Fat %: 48.3 % Fat Mass (lbs): 108 lbs Muscle Mass (lbs): 110 lbs Total Body Water (lbs): 81 lbs Visceral Fat Rating : 14   Other Clinical Data Fasting: yes Labs: no Today's Visit #: 18 Starting Date: 07/24/21     HPI  Chief Complaint: OBESITY  [name] is here to discuss her progress with her obesity treatment plan. She is on the the Category 2 Plan and states she is following her eating plan approximately 85 % of the time. She states she is exercising- [employer] 60 minutes 3 times per week.  Discussed the use of AI scribe software for clinical note transcription with the patient, who gave verbal consent to proceed.  Interval History:  Since last office visit she is up 2 lbs.  The patient, with a history of obesity, type two diabetes, and pulmonary hypertension, presents for a follow-up visit. She reports some fluid retention and has been taking their prescribed torsemide as needed only. She has not been taking any torsemide over the past couple of days due to appointments. She does report some shortness of breath, which she attributes to the weather. She continues on chronic oxygen therapy, Despite these issues, the patient reports feeling okay and strong. She has been trying to increase their protein intake and have been following a diet plan. The patient is also engaged in exercise classes and strength training.   Pharmacotherapy: Mounjaro 7.5 mg weekly. Dose decreased from 10 mg due to significant  decline in appetite last month.  Patient reports doing much better on decreased Mounjaro dosing. Denies mass in neck, dysphagia, dyspepsia, persistent hoarseness, abdominal pain, or N/V/Constipation or diarrhea. Has annual eye exam. Mood is stable.    TREATMENT PLAN FOR OBESITY: Down total of 18 lbs. Since 07/24/2021 TBW loss of 7.5%  Recommended Dietary Goals  [name] is currently in the action stage of change. As such, her goal is to continue weight management plan. She has agreed to the Category 2 Plan and the Category 3 Plan.  Behavioral Intervention  We discussed the following Behavioral Modification Strategies today: increasing lean protein intake, decreasing simple carbohydrates , increasing vegetables, increasing lower glycemic fruits, avoiding skipping meals, increasing water intake, decreasing eating out or consumption of processed foods, and making healthy choices when eating convenient foods, emotional eating strategies and understanding the difference between hunger signals and cravings, continue to practice mindfulness when eating, and planning for success.  Additional resources provided today: NA  Recommended Physical Activity Goals  [name] has been advised to work up to 150 minutes of moderate intensity aerobic activity a week and strengthening exercises 2-3 times per week for cardiovascular health, weight loss maintenance and preservation of muscle mass.   She has agreed to Continue current level of physical activity    Pharmacotherapy We discussed various medication options to help [facility] with her weight loss efforts and we both agreed to continue Mounjaro 7.5 mg weekly for Type 2 diabetes.    Return in about 4 weeks (  around 01/27/2023).Marland Kitchen She was informed of the importance of frequent follow up visits to maximize her success with intensive lifestyle modifications for her multiple health conditions.  PHYSICAL EXAM:  Blood pressure 131/88, pulse 96, temperature 98.3 F  (36.8 C), height 5\' 5"  (1.651 m), weight 223 lb (101.2 kg), last menstrual period 05/11/2013, SpO2 95%. Body mass index is 37.11 kg/m.  General: She is overweight, cooperative, alert, well developed, and in no acute distress. PSYCH: Has normal mood, affect and thought process.   Cardiovascular: HR 90's BP 131/88. Mild peripheral edema Lungs: Normal breathing effort, no conversational dyspnea but on usual oxygen therapy chronically. Neuro: no focal deficits  DIAGNOSTIC DATA REVIEWED:  BMET    Component Value Date/Time   NA 143 12/01/2022 1506   NA 139 03/31/2017 1332   K 3.3 (L) 12/01/2022 1506   K 3.6 03/31/2017 1332   CL 100 12/01/2022 1506   CO2 24 12/01/2022 1506   CO2 26 03/31/2017 1332   GLUCOSE 90 12/01/2022 1506   GLUCOSE 93 10/26/2022 1058   GLUCOSE 129 03/31/2017 1332   BUN 19 12/01/2022 1506   BUN 18.1 03/31/2017 1332   CREATININE 0.77 12/01/2022 1506   CREATININE 0.68 10/26/2022 1058   CREATININE 1.0 03/31/2017 1332   CALCIUM 9.3 12/01/2022 1506   CALCIUM 9.2 03/31/2017 1332   GFRNONAA >60 10/26/2022 1058   GFRAA 83 03/05/2020 1409   Lab Results  Component Value Date   HGBA1C 5.2 11/12/2022   HGBA1C 6.2 (H) 06/09/2013   No results found for: "INSULIN" Lab Results  Component Value Date   TSH 0.82 07/03/2021   CBC    Component Value Date/Time   WBC 5.5 10/26/2022 1058   WBC 6.2 07/03/2021 1138   RBC 4.72 10/26/2022 1058   HGB 15.1 (H) 10/26/2022 1058   HGB 11.0 (L) 11/06/2020 1406   HGB 13.4 03/31/2017 1332   HCT 43.6 10/26/2022 1058   HCT 32.7 (L) 11/06/2020 1406   HCT 39.7 03/31/2017 1332   PLT 123 (L) 10/26/2022 1058   PLT 94 (LL) 11/06/2020 1406   MCV 92.4 10/26/2022 1058   MCV 92 11/06/2020 1406   MCV 99.3 03/31/2017 1332   MCH 32.0 10/26/2022 1058   MCHC 34.6 10/26/2022 1058   RDW 13.7 10/26/2022 1058   RDW 13.1 11/06/2020 1406   RDW 14.0 03/31/2017 1332   Iron Studies    Component Value Date/Time   IRON 81 12/23/2020 1045   IRON  33 (L) 06/14/2013 1141   TIBC 366 12/23/2020 1045   TIBC 441 06/14/2013 1141   FERRITIN 160 12/23/2020 1045   FERRITIN 88 06/14/2013 1141   IRONPCTSAT 22 12/23/2020 1045   IRONPCTSAT 16 08/30/2017 1648   Lipid Panel     Component Value Date/Time   CHOL 191 09/09/2022 1250   TRIG 121 09/09/2022 1250   HDL 49 09/09/2022 1250   CHOLHDL 3.9 09/09/2022 1250   CHOLHDL 4 07/03/2021 1138   VLDL 23.6 07/03/2021 1138   LDLCALC 120 (H) 09/09/2022 1250   Hepatic Function Panel     Component Value Date/Time   PROT 7.0 12/01/2022 1506   PROT 7.8 03/31/2017 1332   ALBUMIN 3.9 12/01/2022 1506   ALBUMIN 3.5 03/31/2017 1332   AST 14 12/01/2022 1506   AST 11 (L) 10/26/2022 1058   AST 16 03/31/2017 1332   ALT 13 12/01/2022 1506   ALT 9 10/26/2022 1058   ALT 14 03/31/2017 1332   ALKPHOS 106 12/01/2022 1506   ALKPHOS  96 03/31/2017 1332   BILITOT 0.7 12/01/2022 1506   BILITOT 0.7 10/26/2022 1058   BILITOT 0.97 03/31/2017 1332   BILIDIR 0.2 12/31/2015 1315      Component Value Date/Time   TSH 0.82 07/03/2021 1138   Nutritional Lab Results  Component Value Date   VD25OH 30.9 11/12/2022   VD25OH 43.0 07/13/2022   VD25OH 30.38 07/03/2021    ASSOCIATED CONDITIONS ADDRESSED TODAY  ASSESSMENT AND PLAN  Problem List Items Addressed This Visit     Pulmonary HTN (HCC)   Type 2 diabetes mellitus with microalbuminuria, without long-term current use of insulin (HCC) - Primary   Relevant Medications   tirzepatide (MOUNJARO) 7.5 MG/0.5ML Pen   Other Relevant Orders   Magnesium   Basic Metabolic Panel (BMET)   Vitamin B12   Vitamin D deficiency   Relevant Orders   VITAMIN D 25 Hydroxy (Vit-D Deficiency, Fractures)   Obesity (HCC)-start bmi 40.10   Relevant Medications   tirzepatide (MOUNJARO) 7.5 MG/0.5ML Pen   Low serum potassium  Obesity Patient is following her nutrition plan and exercise regimen.  Bio impedence scale reviewed with the patient:  Slight increase in muscle mass  and adipose tissue, but significant increase in fluid weight. -Continue current diet and exercise plan.   Pulmonary Hypertension Patient reports increased shortness of breath, possibly due to weather conditions. Noted puffiness in hands and feet, suggesting fluid retention. Has not take torsemide over the past couple of days as going to appointments.  -Continue Torsemide 20mg  as needed, with recommendation to take a dose today due to observed fluid retention and monitor closely for response. See PCP or pulmonary if not improving.   -Continue Spironolactone daily. Recheck electrolytes today. Patient reports has potassium if needed .   Type 2 Diabetes Mellitus with other specified complication, without long-term current use of insulin HgbA1c is at goal. Last A1c was 6.1- much improved  Medication(s): Mounjaro 7.5 mg SQ weekly Denies mass in neck, dysphagia, dyspepsia, persistent hoarseness, abdominal pain, or N/V/Constipation or diarrhea. Has annual eye exam. Mood is stable.  Reports feeling less nausea with decrease in Mounjaro dose and not skipping meals, feels appetite is improved as was previously very suppressed.   Lab Results  Component Value Date   HGBA1C 6.1 12/23/2022   HGBA1C 7.5 (H) 07/23/2022   HGBA1C 7.7 (H) 04/08/2022   Lab Results  Component Value Date   MICROALBUR <0.7 04/08/2022   LDLCALC 114 (H) 12/23/2022   CREATININE 0.98 12/23/2022   Lab Results  Component Value Date   GFR 59.46 (L) 12/23/2022   GFR 69.90 04/08/2022   GFR 81.38 09/25/2021    Plan: Continue and refill Mounjaro 7.5 mg SQ weekly She is working  on nutrition plan to decrease simple carbohydrates, increase lean proteins and exercise to promote weight loss and improve glycemic control .   Hypokalemia Recent labs showed low potassium levels. -Check potassium levels today. -Continue taking potassium supplements at home.  Vitamin D Deficiency Vitamin D is not at goal of 50.  Most recent  vitamin D level was 30.9. She is on  prescription ergocalciferol 50,000 IU weekly. Lab Results  Component Value Date   VD25OH 30.9 11/12/2022   VD25OH 43.0 07/13/2022   VD25OH 30.38 07/03/2021    Plan: Continue  prescription ergocalciferol 50,000 IU weekly Low vitamin D levels can be associated with adiposity and may result in leptin resistance and weight gain. Also associated with fatigue. Currently on vitamin D supplementation without any adverse effects.  Recheck vitamin D today  General Health Maintenance -Check BMP, Magnesium, and Vitamin D and B 12 levels today. -Refill Mounjaro prescription. -Follow-up appointment in 4 weeks on January 27, 2023.  ATTESTASTION STATEMENTS:  Reviewed by clinician on day of visit: allergies, medications, problem list, medical history, surgical history, family history, social history, and previous encounter notes.   I have personally spent 42 minutes total time today in preparation, patient care, nutritional counseling and documentation for this visit, including the following: review of clinical lab tests; review of medical tests/procedures/services.      Lucas Winograd, PA-C

## 2022-12-31 LAB — BASIC METABOLIC PANEL
BUN/Creatinine Ratio: 13 (ref 9–23)
BUN: 11 mg/dL (ref 6–24)
CO2: 23 mmol/L (ref 20–29)
Calcium: 9.3 mg/dL (ref 8.7–10.2)
Chloride: 103 mmol/L (ref 96–106)
Creatinine, Ser: 0.85 mg/dL (ref 0.57–1.00)
Glucose: 80 mg/dL (ref 70–99)
Potassium: 3.9 mmol/L (ref 3.5–5.2)
Sodium: 141 mmol/L (ref 134–144)
eGFR: 80 mL/min/{1.73_m2} (ref >59)

## 2022-12-31 LAB — VITAMIN B12: Vitamin B-12: 1132 pg/mL (ref 232–1245)

## 2022-12-31 LAB — VITAMIN D 25 HYDROXY (VIT D DEFICIENCY, FRACTURES): Vit D, 25-Hydroxy: 22.1 ng/mL — ABNORMAL LOW (ref 30.0–100.0)

## 2022-12-31 LAB — MAGNESIUM: Magnesium: 1.7 mg/dL (ref 1.6–2.3)

## 2023-01-01 ENCOUNTER — Other Ambulatory Visit (HOSPITAL_COMMUNITY): Payer: Self-pay

## 2023-01-11 ENCOUNTER — Ambulatory Visit: Payer: 59 | Admitting: Nurse Practitioner

## 2023-01-11 ENCOUNTER — Encounter: Payer: Self-pay | Admitting: Nurse Practitioner

## 2023-01-11 VITALS — BP 100/60 | HR 100 | Temp 98.7°F | Ht 65.0 in | Wt 225.8 lb

## 2023-01-11 DIAGNOSIS — Z23 Encounter for immunization: Secondary | ICD-10-CM | POA: Diagnosis not present

## 2023-01-11 DIAGNOSIS — I110 Hypertensive heart disease with heart failure: Secondary | ICD-10-CM | POA: Diagnosis not present

## 2023-01-11 DIAGNOSIS — I5032 Chronic diastolic (congestive) heart failure: Secondary | ICD-10-CM | POA: Diagnosis not present

## 2023-01-11 DIAGNOSIS — E782 Mixed hyperlipidemia: Secondary | ICD-10-CM | POA: Diagnosis not present

## 2023-01-11 DIAGNOSIS — R7309 Other abnormal glucose: Secondary | ICD-10-CM | POA: Diagnosis not present

## 2023-01-11 DIAGNOSIS — Z2821 Immunization not carried out because of patient refusal: Secondary | ICD-10-CM

## 2023-01-11 DIAGNOSIS — E66812 Obesity, class 2: Secondary | ICD-10-CM

## 2023-01-11 DIAGNOSIS — E7800 Pure hypercholesterolemia, unspecified: Secondary | ICD-10-CM

## 2023-01-11 DIAGNOSIS — Z6837 Body mass index (BMI) 37.0-37.9, adult: Secondary | ICD-10-CM

## 2023-01-11 DIAGNOSIS — E6609 Other obesity due to excess calories: Secondary | ICD-10-CM

## 2023-01-11 NOTE — Progress Notes (Signed)
Madelaine Bhat, [name],acting as a Neurosurgeon for Arnette Felts, FNP.,have documented all relevant documentation on the behalf of Arnette Felts, FNP,as directed by  Arnette Felts, FNP while in the presence of Arnette Felts, FNP.  Subjective:  Patient ID: [name] , female    DOB: 1966/02/03 , 57 y.o.   MRN: [id number]  Chief Complaint  Patient presents with   Hypertension    HPI  Patient presents today for a bp, chol, and dm follow up, Patient reports compliance with medication. Patient denies any chest pain, SOB, or headaches. Patient has no concerns today.   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   Menorrhagia    secondary to uterine fibroids   OSA (obstructive sleep apnea)    07/25/13 HST AHI 33/hr, severe hypoxemia O2 min 42% and 95% of the time <89%   PE (pulmonary embolism) 2014   bilateral   Prediabetes    Pulmonary artery hypertension (HCC)    Pulmonary nodule    (5mm on loeft lower lobe) found on CT scan July 2014, repeat scan Jan 2015 showed less than 4mm   Right ovarian cyst    noted 09/2012    S/P TAH (total abdominal hysterectomy) 06/07/2013   Sleep apnea    SOB (shortness of breath) on exertion    Stomach ulcer    Trichomoniasis    05/2011    Vitamin D deficiency      Family History  Problem Relation Age of Onset   Hypertension Mother    Kidney disease Mother    Heart disease Mother    Alcoholism Mother    Hypertension Father    Heart disease  Father    Stroke Sister    Hypertension Brother    Breast cancer Maternal Grandmother        died at 100   Breast cancer Paternal Grandmother    Cancer Maternal Aunt        unknown form   Breast cancer Paternal Aunt    Breast cancer Paternal Aunt    Breast cancer Cousin        pat first cousin dx in her 70s   Colon cancer Other 48       MGMs brother   Cancer Other        breast ca in [place]   Cancer Other        g uncle colon or stomach ca     Current Outpatient Medications:    acetaminophen (TYLENOL) 500 MG tablet, Take 1,000 mg by mouth every 6 (six) hours as needed for moderate pain or headache. , Disp: , Rfl:    albuterol (ACCUNEB) 1.25 MG/3ML nebulizer solution, Take 3 mLs (1.25 mg total) by nebulization every 6 (six) hours as needed for wheezing., Disp: 360 mL, Rfl: 12   albuterol (PROAIR HFA) 108 (90 Base) MCG/ACT  inhaler, Inhale 1-2 puffs into the lungs every 6 (six) hours as needed for wheezing or shortness of breath., Disp: 18 g, Rfl: 11   allopurinol (ZYLOPRIM) 300 MG tablet, TAKE 1 TABLET BY MOUTH EVERY DAY NEED APPT, Disp: 90 tablet, Rfl: 3   anastrozole (ARIMIDEX) 1 MG tablet, TAKE 1 TABLET BY MOUTH EVERY DAY, Disp: 90 tablet, Rfl: 3   atorvastatin (LIPITOR) 20 MG tablet, Take 1 tablet (20 mg total) by mouth daily., Disp: 30 tablet, Rfl: 2   Azelastine HCl 137 MCG/SPRAY SOLN, PLACE 1 SPRAY INTO BOTH NOSTRILS 2 (TWO) TIMES DAILY. USE IN EACH NOSTRIL AS DIRECTED, Disp: 30 mL, Rfl: 4   chlorpheniramine-HYDROcodone (TUSSIONEX) 10-8 MG/5ML, Take 5 mLs by mouth every 12 (twelve) hours as needed for cough., Disp: 115 mL, Rfl: 0   clotrimazole-betamethasone (LOTRISONE) cream, Apply 1 application topically 2 (two) times daily. Prn leg dermatitis, Disp: 45 g, Rfl: 0   Colchicine (MITIGARE) 0.6 MG CAPS, Take 1 capsule by mouth as needed. As needed gout flare max # of pills 2 in 24 hours, Disp: 30 capsule, Rfl: 11   fluticasone (FLONASE) 50 MCG/ACT nasal spray, Place into both nostrils  daily as needed for allergies or rhinitis., Disp: , Rfl:    Fluticasone-Salmeterol (ADVAIR HFA IN), Inhale 1 puff into the lungs as needed., Disp: , Rfl:    ibuprofen (ADVIL) 800 MG tablet, Take 1 tablet (800 mg total) by mouth every 8 (eight) hours as needed., Disp: 90 tablet, Rfl: 1   loratadine (CLARITIN) 10 MG tablet, Take 1 tablet (10 mg total) by mouth daily., Disp: 90 tablet, Rfl: 3   Magnesium Cl-Calcium Carbonate (SLOW MAGNESIUM/CALCIUM) 70-117 MG TBEC, Take 2 tablets by mouth in the morning and at bedtime., Disp: , Rfl:    metoprolol tartrate (LOPRESSOR) 25 MG tablet, TAKE 1 TABLET BY MOUTH TWICE A DAY, Disp: 180 tablet, Rfl: 3   Multiple Vitamin (MULTIVITAMIN) tablet, Take 1 tablet by mouth daily., Disp: , Rfl:    omeprazole (PRILOSEC) 40 MG capsule, Take 40 mg by mouth as needed., Disp: , Rfl:    OPSUMIT 10 MG TABS, Take 10 mg by mouth daily., Disp: , Rfl: 8   Polyethyl Glycol-Propyl Glycol (SYSTANE OP), Place 1 drop into both eyes 3 (three) times daily as needed (dry/irritated eyes.)., Disp: , Rfl:    potassium chloride SA (KLOR-CON) 20 MEQ tablet, Take 1 tablet (20 mEq total) by mouth daily., Disp: , Rfl:    rivaroxaban (XARELTO) 20 MG TABS tablet, Take 20 mg by mouth daily with supper., Disp: , Rfl:    sildenafil (REVATIO) 20 MG tablet, Take 1-2 tablets (20-40 mg total) by mouth 3 (three) times daily. As of 07/03/20 taking 20 mg tid per [facility] pulm Dr. Tresa Res, Disp: 10 tablet, Rfl: 0   spironolactone (ALDACTONE) 50 MG tablet, Take 1 tablet (50 mg total) by mouth daily., Disp: 90 tablet, Rfl: 3   tirzepatide (MOUNJARO) 7.5 MG/0.5ML Pen, Inject 7.5 mg into the skin once a week., Disp: 2 mL, Rfl: 2   torsemide (DEMADEX) 20 MG tablet, Take 20 mg by mouth 2 (two) times daily., Disp: , Rfl:    treprostinil (REMODULIN) 20 MG/20ML injection, 20 ng/kg/min by Continuous infusion (non-IV) route continuous., Disp: , Rfl:    triamcinolone cream (KENALOG) 0.1 %, Apply 1 application topically daily as  needed (leg discoloration)., Disp: 454 g, Rfl: 2   Vitamin D, Ergocalciferol, (DRISDOL) 1.25 MG (50000 UNIT) CAPS capsule, Take 1 capsule (50,000 Units total) by mouth  every 7 (seven) days., Disp: 5 capsule, Rfl: 0   WINREVAIR 2 x 45 MG subcutaneous injection, Inject into the skin., Disp: , Rfl:  No current facility-administered medications for this visit.  Facility-Administered Medications Ordered in Other Visits:    heparin lock flush 100 unit/mL, 500 Units, Intracatheter, Once PRN, Serena Croissant, MD   sodium chloride flush (NS) 0.9 % injection 10 mL, 10 mL, Intracatheter, PRN, Serena Croissant, MD   Allergies  Allergen Reactions   Ampicillin Other (See Comments)    Severe abdominal pain, dizziness (penicillin is okay)   Amoxicillin Other (See Comments)    LIGHT-HEADED and CLOSE TO "PASSING OUT"  AND ABDOMINAL PAIN Has patient had a PCN reaction causing immediate rash, facial/tongue/throat swelling, SOB or lightheadedness with hypotension: No Has patient had a PCN reaction causing severe rash involving mucus membranes or skin necrosis: No Has patient had a PCN reaction that required hospitalization No Has patient had a PCN reaction occurring within the last 10 years: No If all of the above answers are "NO", then may proceed with Cephalosporin use.   Bismuth-Containing Compounds    Indomethacin Other (See Comments)    Gastro irritation   Nsaids     GI ulcers   Penicillins    Sulfasalazine    Metronidazole Nausea And Vomiting   Sulfa Antibiotics Other (See Comments) and Rash    Very bad yeast infection     Review of Systems  Constitutional: Negative.   HENT: Negative.    Eyes: Negative.   Respiratory: Negative.         Wearing her oxygen via [facility] @ 2 l/m  Cardiovascular: Negative.   Gastrointestinal: Negative.   Neurological: Negative.   Psychiatric/Behavioral: Negative.       Today's Vitals   01/11/23 1138  BP: 100/60  Pulse: 100  Temp: 98.7 F (37.1 C)  TempSrc: Oral   Weight: 225 lb 12.8 oz (102.4 kg)  Height: 5\' 5"  (1.651 m)  PainSc: 0-No pain   Body mass index is 37.58 kg/m.  Wt Readings from Last 3 Encounters:  01/13/23 230 lb (104.3 kg)  01/11/23 225 lb 12.8 oz (102.4 kg)  12/30/22 223 lb (101.2 kg)     Objective:  Physical Exam Vitals reviewed.  Constitutional:      Appearance: She is well-developed.  HENT:     Head: Normocephalic and atraumatic.  Eyes:     Pupils: Pupils are equal, round, and reactive to light.  Cardiovascular:     Rate and Rhythm: Normal rate and regular rhythm.     Pulses: Normal pulses.     Heart sounds: Normal heart sounds. No murmur heard. Pulmonary:     Effort: Pulmonary effort is normal.     Breath sounds: Normal breath sounds.  Musculoskeletal:        General: Normal range of motion.  Skin:    General: Skin is warm and dry.     Capillary Refill: Capillary refill takes less than 2 seconds.  Neurological:     General: No focal deficit present.     Mental Status: She is alert and oriented to person, place, and time.     Cranial Nerves: No cranial nerve deficit.  Psychiatric:        Mood and Affect: Mood normal.         Assessment And Plan:  Hypertensive heart disease with chronic diastolic congestive heart failure (HCC) Assessment & Plan: Blood pressure is well controlled, continue current medications   Abnormal glucose Assessment &  Plan: Hgba1c is normal range, continue focusing on healthy diet and regular exercise as tolerated.    Mixed hyperlipidemia Assessment & Plan: Cholesterol levels are improving, continue focusing on diet low in fat. Continue statin, tolerating well   Need for influenza vaccination Assessment & Plan: Influenza vaccine administered Encouraged to take Tylenol as needed for fever or muscle aches.   Orders: -     Flu vaccine trivalent PF, 6mos and older(Flulaval,Afluria,Fluarix,Fluzone)  COVID-19 vaccination declined Assessment & Plan: Declines covid 19  vaccine. Discussed risk of covid 7 and if she changes her mind about the vaccine to call the office. Education has been provided regarding the importance of this vaccine but patient still declined. Advised may receive this vaccine at local pharmacy or Health Dept.or vaccine clinic. Aware to provide a copy of the vaccination record if obtained from local pharmacy or Health Dept.  Encouraged to take multivitamin, vitamin d, vitamin c and zinc to increase immune system. Aware can call office if would like to have vaccine here at office. Verbalized acceptance and understanding.    Class 2 obesity due to excess calories with body mass index (BMI) of 37.0 to 37.9 in adult, unspecified whether serious comorbidity present Assessment & Plan: She is encouraged to strive for BMI less than 30 to decrease cardiac risk. Advised to aim for at least 150 minutes of exercise per week. [facility] I have advised her if she decides to stop going there at that time we could consider weight loss medications pending insurance approval. I have advised her to check with her insurance on coverage.       Return for 3-4 month f/u.  Patient was given opportunity to ask questions. Patient verbalized understanding of the plan and was able to repeat key elements of the plan. All questions were answered to their satisfaction.    Jeanell Sparrow, FNP, have reviewed all documentation for this visit. The documentation on 01/11/23 for the exam, diagnosis, procedures, and orders are all accurate and complete.   IF YOU HAVE BEEN REFERRED TO A SPECIALIST, IT MAY TAKE 1-2 WEEKS TO SCHEDULE/PROCESS THE REFERRAL. IF YOU HAVE NOT HEARD FROM US/SPECIALIST IN TWO WEEKS, PLEASE GIVE [place] A CALL AT [phone].

## 2023-01-13 ENCOUNTER — Other Ambulatory Visit: Payer: Self-pay

## 2023-01-13 ENCOUNTER — Telehealth (HOSPITAL_COMMUNITY): Payer: Self-pay | Admitting: Cardiology

## 2023-01-13 ENCOUNTER — Encounter (HOSPITAL_COMMUNITY): Payer: Self-pay | Admitting: *Deleted

## 2023-01-13 ENCOUNTER — Emergency Department (HOSPITAL_COMMUNITY)
Admission: EM | Admit: 2023-01-13 | Discharge: 2023-01-13 | Disposition: A | Discharge status: Home / Self Care | Payer: 59 | Attending: Emergency Medicine | Admitting: Emergency Medicine

## 2023-01-13 ENCOUNTER — Other Ambulatory Visit (HOSPITAL_COMMUNITY): Payer: Self-pay

## 2023-01-13 DIAGNOSIS — Z79899 Other long term (current) drug therapy: Secondary | ICD-10-CM | POA: Diagnosis not present

## 2023-01-13 DIAGNOSIS — T82898A Other specified complication of vascular prosthetic devices, implants and grafts, initial encounter: Secondary | ICD-10-CM | POA: Diagnosis present

## 2023-01-13 DIAGNOSIS — Y712 Prosthetic and other implants, materials and accessory cardiovascular devices associated with adverse incidents: Secondary | ICD-10-CM | POA: Insufficient documentation

## 2023-01-13 DIAGNOSIS — T829XXA Unspecified complication of cardiac and vascular prosthetic device, implant and graft, initial encounter: Secondary | ICD-10-CM

## 2023-01-13 DIAGNOSIS — R0602 Shortness of breath: Secondary | ICD-10-CM | POA: Insufficient documentation

## 2023-01-13 DIAGNOSIS — I270 Primary pulmonary hypertension: Secondary | ICD-10-CM | POA: Diagnosis not present

## 2023-01-13 DIAGNOSIS — I2721 Secondary pulmonary arterial hypertension: Secondary | ICD-10-CM

## 2023-01-13 LAB — COMPREHENSIVE METABOLIC PANEL
ALT: 12 U/L (ref 0–44)
AST: 15 U/L (ref 15–41)
Albumin: 3.5 g/dL (ref 3.5–5.0)
Alkaline Phosphatase: 70 U/L (ref 38–126)
Anion gap: 10 (ref 5–15)
BUN: 15 mg/dL (ref 6–20)
CO2: 27 mmol/L (ref 22–32)
Calcium: 8.9 mg/dL (ref 8.9–10.3)
Chloride: 103 mmol/L (ref 98–111)
Creatinine, Ser: 0.63 mg/dL (ref 0.44–1.00)
GFR, Estimated: >60 mL/min (ref >60)
Glucose, Bld: 90 mg/dL (ref 70–99)
Potassium: 3.5 mmol/L (ref 3.5–5.1)
Sodium: 140 mmol/L (ref 135–145)
Total Bilirubin: 0.8 mg/dL (ref 0.3–1.2)
Total Protein: 7.4 g/dL (ref 6.5–8.1)

## 2023-01-13 LAB — LACTIC ACID, PLASMA: Lactic Acid, Venous: 0.7 mmol/L (ref 0.5–1.9)

## 2023-01-13 LAB — CBC WITH DIFFERENTIAL/PLATELET
Abs Immature Granulocytes: 0.22 10*3/uL — ABNORMAL HIGH (ref 0.00–0.07)
Basophils Absolute: 0.1 10*3/uL (ref 0.0–0.1)
Basophils Relative: 1 %
Eosinophils Absolute: 0.2 10*3/uL (ref 0.0–0.5)
Eosinophils Relative: 3 %
HCT: 49 % — ABNORMAL HIGH (ref 36.0–46.0)
Hemoglobin: 17 g/dL — ABNORMAL HIGH (ref 12.0–15.0)
Immature Granulocytes: 4 %
Lymphocytes Relative: 27 %
Lymphs Abs: 1.4 10*3/uL (ref 0.7–4.0)
MCH: 32.4 pg (ref 26.0–34.0)
MCHC: 34.7 g/dL (ref 30.0–36.0)
MCV: 93.3 fL (ref 80.0–100.0)
Monocytes Absolute: 0.3 10*3/uL (ref 0.1–1.0)
Monocytes Relative: 5 %
Neutro Abs: 3.1 10*3/uL (ref 1.7–7.7)
Neutrophils Relative %: 60 %
Platelets: 126 10*3/uL — ABNORMAL LOW (ref 150–400)
RBC: 5.25 MIL/uL — ABNORMAL HIGH (ref 3.87–5.11)
RDW: 13.7 % (ref 11.5–15.5)
WBC: 5.2 10*3/uL (ref 4.0–10.5)
nRBC: 0 % (ref 0.0–0.2)

## 2023-01-13 LAB — TROPONIN I (HIGH SENSITIVITY)
Troponin I (High Sensitivity): 7 ng/L (ref <18)
Troponin I (High Sensitivity): 8 ng/L (ref <18)

## 2023-01-13 LAB — BRAIN NATRIURETIC PEPTIDE: B Natriuretic Peptide: 28 pg/mL (ref 0.0–100.0)

## 2023-01-13 NOTE — Telephone Encounter (Signed)
Pt left VM on triage  wanted to notify provider currently in urgent care will be transferred to ER for pulmonary infection,  Currently on remodulin so needed to inform Dr Gala Romney  Will route as FYI as pt is currently at [facility]

## 2023-01-13 NOTE — ED Triage Notes (Signed)
Sent from UC to R/o PE and questionable infection of her port.

## 2023-01-13 NOTE — ED Provider Triage Note (Signed)
Emergency Medicine Provider Triage Evaluation Note  [name] , a 57 y.o. female  was evaluated in triage.  Pt complains of possible port infection. She reports that she had her flu vaccine on Monday and started to feel body aches and slightly more SOB than baseline. No cough or chest pain. Denies any fever, mild rhinorrhea. She has pulm HTN and was seen by pulmonology three weeks ago for possible line infection and was given doxycycline. She was seen by UC and was told that "she had a blood clot in her lungs" by UC and was told to go to the ER. She was placed on clindamycin for the port infection as well given that she reports it hasn't gotten any better. She has contacted pulm today without response  Review of Systems  Positive:  Negative:   Physical Exam  BP (!) 167/85 (BP Location: Left Arm)   Pulse 80   Temp 98.1 F (36.7 C) (Oral)   Resp 16   Ht 5\' 5"  (1.651 m)   Wt 104.3 kg   LMP 05/11/2013   SpO2 99%   BMI 38.27 kg/m  Gen:   Awake, no distress   Resp:  Normal effort, on chronic [place], no acute distress. Speaks in full sentences.  MSK:   Moves extremities without difficulty  Other:    Medical Decision Making  Medically screening exam initiated at 2:23 PM.  Appropriate orders placed.  [name] was informed that the remainder of the evaluation will be completed by another provider, this initial triage assessment does not replace that evaluation, and the importance of remaining in the ED until their evaluation is complete.  Labs ordered. Will defer imaging until patient is seen in the main ER.    Achille Rich, PA-C 01/13/23 1610

## 2023-01-13 NOTE — ED Provider Notes (Signed)
[place] EMERGENCY DEPARTMENT AT [facility] Provider Note   CSN: [id number] Arrival date & time: 01/13/23  1317     History  No chief complaint on file.   [name] is a 57 y.o. female.  HPI    57 year old female with history of pulmonary hypertension on review module and comes in with chief complaint of questionable infection.  Patient indicates that she had some brown drainage from her port site on 10-1.  The port has been placed for 3 years now.  She called her pulmonary hypertension team at [facility] and they put her on some antibiotics.  Despite taking the antibiotics, she has continued to have drainage.  She saw urgent care over at [facility] earlier today for foreign body in her foot.  While there, she asked them about assessing her port site to see if the infection has cleared up.  They advised that she come to the ER.  Patient denies any nausea, vomiting, fevers, chills.  The drainage is brownish in color.  She denies any yellow-green pus.  She denies any localized pain.  Moreover, patient indicates that if it were not for foreign body to her foot, she would have not gone to the urgent care in first place.  Home Medications Prior to Admission medications   Medication Sig Start Date End Date Taking? Authorizing Provider  acetaminophen (TYLENOL) 500 MG tablet Take 1,000 mg by mouth every 6 (six) hours as needed for moderate pain or headache.     [provider]  albuterol (ACCUNEB) 1.25 MG/3ML nebulizer solution Take 3 mLs (1.25 mg total) by nebulization every 6 (six) hours as needed for wheezing. 04/30/22   Arnette Felts, FNP  albuterol (PROAIR HFA) 108 (90 Base) MCG/ACT inhaler Inhale 1-2 puffs into the lungs every 6 (six) hours as needed for wheezing or shortness of breath. 11/03/21   McLean-Scocuzza, Pasty Spillers, MD  allopurinol (ZYLOPRIM) 300 MG tablet TAKE 1 TABLET BY MOUTH EVERY DAY NEED APPT 12/02/21   McLean-Scocuzza, Pasty Spillers, MD  anastrozole  (ARIMIDEX) 1 MG tablet TAKE 1 TABLET BY MOUTH EVERY DAY 09/15/21   Serena Croissant, MD  atorvastatin (LIPITOR) 20 MG tablet Take 1 tablet (20 mg total) by mouth daily. 10/09/22 10/09/23  Arnette Felts, FNP  Azelastine HCl 137 MCG/SPRAY SOLN PLACE 1 SPRAY INTO BOTH NOSTRILS 2 (TWO) TIMES DAILY. USE IN EACH NOSTRIL AS DIRECTED 05/07/21   Worthy Rancher B, FNP  chlorpheniramine-HYDROcodone (TUSSIONEX) 10-8 MG/5ML Take 5 mLs by mouth every 12 (twelve) hours as needed for cough. 05/07/22   Arnette Felts, FNP  clotrimazole-betamethasone (LOTRISONE) cream Apply 1 application topically 2 (two) times daily. Prn leg dermatitis 10/25/20   McLean-Scocuzza, Pasty Spillers, MD  Colchicine (MITIGARE) 0.6 MG CAPS Take 1 capsule by mouth as needed. As needed gout flare max # of pills 2 in 24 hours 12/02/21   McLean-Scocuzza, Pasty Spillers, MD  fluticasone (FLONASE) 50 MCG/ACT nasal spray Place into both nostrils daily as needed for allergies or rhinitis.    [provider]  Fluticasone-Salmeterol (ADVAIR HFA IN) Inhale 1 puff into the lungs as needed.    [provider]  ibuprofen (ADVIL) 800 MG tablet Take 1 tablet (800 mg total) by mouth every 8 (eight) hours as needed. 12/02/21   McLean-Scocuzza, Pasty Spillers, MD  loratadine (CLARITIN) 10 MG tablet Take 1 tablet (10 mg total) by mouth daily. 12/02/21   McLean-Scocuzza, Pasty Spillers, MD  Magnesium Cl-Calcium Carbonate (SLOW MAGNESIUM/CALCIUM) 70-117 MG TBEC Take 2  tablets by mouth in the morning and at bedtime. 10/15/20   Serena Croissant, MD  metoprolol tartrate (LOPRESSOR) 25 MG tablet TAKE 1 TABLET BY MOUTH TWICE A DAY 04/18/21   McLean-Scocuzza, Pasty Spillers, MD  Multiple Vitamin (MULTIVITAMIN) tablet Take 1 tablet by mouth daily.    [provider]  omeprazole (PRILOSEC) 40 MG capsule Take 40 mg by mouth as needed.    [provider]  OPSUMIT 10 MG TABS Take 10 mg by mouth daily. 06/03/16   [provider]  Polyethyl Glycol-Propyl Glycol (SYSTANE OP) Place 1 drop into  both eyes 3 (three) times daily as needed (dry/irritated eyes.).    [provider]  potassium chloride SA (KLOR-CON) 20 MEQ tablet Take 1 tablet (20 mEq total) by mouth daily. 10/15/20   Serena Croissant, MD  rivaroxaban (XARELTO) 20 MG TABS tablet Take 20 mg by mouth daily with supper.    [provider]  sildenafil (REVATIO) 20 MG tablet Take 1-2 tablets (20-40 mg total) by mouth 3 (three) times daily. As of 07/03/20 taking 20 mg tid per [facility] pulm Dr. Tresa Res 07/03/20   McLean-Scocuzza, Pasty Spillers, MD  spironolactone (ALDACTONE) 50 MG tablet Take 1 tablet (50 mg total) by mouth daily. 03/04/21   McLean-Scocuzza, Pasty Spillers, MD  tirzepatide [facility]) 7.5 MG/0.5ML Pen Inject 7.5 mg into the skin once a week. 12/30/22   Rayburn, Fanny Bien, PA-C  torsemide (DEMADEX) 20 MG tablet Take 20 mg by mouth 2 (two) times daily. 10/07/22   [provider]  treprostinil (REMODULIN) 20 MG/20ML injection 20 ng/kg/min by Continuous infusion (non-IV) route continuous. 03/21/19   [provider]  triamcinolone cream (KENALOG) 0.1 % Apply 1 application topically daily as needed (leg discoloration). 10/05/19   McLean-Scocuzza, Pasty Spillers, MD  Vitamin D, Ergocalciferol, (DRISDOL) 1.25 MG (50000 UNIT) CAPS capsule Take 1 capsule (50,000 Units total) by mouth every 7 (seven) days. 11/04/22   Rayburn, Fanny Bien, PA-C  WINREVAIR 2 x 45 MG subcutaneous injection Inject into the skin. 10/08/22   [provider]  prochlorperazine (COMPAZINE) 10 MG tablet Take 1 tablet (10 mg total) by mouth every 6 (six) hours as needed (Nausea or vomiting). 08/04/16 08/17/16  Serena Croissant, MD      Allergies    Ampicillin, Amoxicillin, Bismuth-containing compounds, Indomethacin, Nsaids, Penicillins, Sulfasalazine, Metronidazole, and Sulfa antibiotics    Review of Systems   Review of Systems  All other systems reviewed and are negative.   Physical Exam Updated Vital Signs BP (!) 150/95   Pulse 89   Temp  98 F (36.7 C)   Resp 18   Ht 5\' 5"  (1.651 m)   Wt 104.3 kg   LMP 05/11/2013   SpO2 94%   BMI 38.27 kg/m  Physical Exam Vitals and nursing note reviewed.  Constitutional:      Appearance: She is well-developed.  HENT:     Head: Atraumatic.  Eyes:     Extraocular Movements: Extraocular movements intact.     Pupils: Pupils are equal, round, and reactive to light.  Cardiovascular:     Rate and Rhythm: Normal rate.     Comments: Patient has a right sided chest wall catheter.  Currently there is no surrounding erythema, warmth to touch or drainage. Pulmonary:     Effort: Pulmonary effort is normal.  Musculoskeletal:     Cervical back: Normal range of motion and neck supple.  Skin:    General: Skin is warm and dry.  Neurological:  Mental Status: She is alert and oriented to person, place, and time.     ED Results / Procedures / Treatments   Labs (all labs ordered are listed, but only abnormal results are displayed) Labs Reviewed  CBC WITH DIFFERENTIAL/PLATELET - Abnormal; Notable for the following components:      Result Value   RBC 5.25 (*)    Hemoglobin 17.0 (*)    HCT 49.0 (*)    Platelets 126 (*)    Abs Immature Granulocytes 0.22 (*)    All other components within normal limits  CULTURE, BLOOD (ROUTINE X 2)  CULTURE, BLOOD (ROUTINE X 2)  CULTURE, BLOOD (ROUTINE X 2)  CULTURE, BLOOD (ROUTINE X 2)  LACTIC ACID, PLASMA  COMPREHENSIVE METABOLIC PANEL  BRAIN NATRIURETIC PEPTIDE  LACTIC ACID, PLASMA  TROPONIN I (HIGH SENSITIVITY)  TROPONIN I (HIGH SENSITIVITY)    EKG EKG Interpretation Date/Time:  Wednesday January 13 2023 14:48:51 EDT Ventricular Rate:  87 PR Interval:  251 QRS Duration:  96 QT Interval:  393 QTC Calculation: 473 R Axis:   97  Text Interpretation: Sinus rhythm Prolonged PR interval Prominent P waves, nondiagnostic Borderline right axis deviation Borderline T abnormalities, anterior leads Abnormal ekg Confirmed by Anders Simmonds [phone])  on 01/13/2023 2:51:19 PM  Radiology No results found.  Procedures Procedures    Medications Ordered in ED Medications - No data to display  ED Course/ Medical Decision Making/ A&P                                 Medical Decision Making  57 year old patient with history of pulmonary hypertension with right-sided port for Remodulin infusion comes in with chief complaint of possible port infection.  I reviewed patient's records including care everywhere and [facility] note.  On 10-1, patient called [facility] because of a brownish discharge from her port site.  It appears that patient was put on some antibiotics.   Today she had gone to urgent care because of glass in her foot.  This was the primary reason she went to the urgent care, which she also discussed with the staff over there her recent infection and wanted them to assess the site given persistent drainage.  They advised that patient come to the ER.  Patient's port site looks clean at this time, but it was cleared at the urgent care per patient.  She has no tenderness, no warmth to touch and no purulence.  Differential considered for this patient includes port infection, cellulitis, abscess, seroma.  Very low clinical suspicion for bacteremia/endocarditis.   We ordered basic labs and I attempted to call [facility].  I consulted [facility] and spoke with transfer line.  They indicated that since patient is getting close care with pulmonary hypertension team, it is ideal for [place] to speak with them rather than pulmonary critical care.  Their service tried to page patient on 2 separate occasions, there was no response.  In the interim, patient's blood work is normal. Lactic acid is fine, white count is normal and patient has no systemic symptoms.  She has 0 SIRS criteria here.  I discussed with the patient that it is probably ideal that she calls her pulmonary hypertension team and sees them in the clinic specifically for port evaluation.  If she starts  having worsening symptoms or any infection symptoms then she needs to come to the ER.  Additionally, we have sent blood cultures, and we will call  her if she has positive blood cultures.  Patient is in agreement with this plan.  Final Clinical Impression(s) / ED Diagnoses Final diagnoses:  Vascular port complication, initial encounter  PAH (pulmonary artery hypertension) (HCC)    Rx / DC Orders ED Discharge Orders     None         Derwood Kaplan, MD 01/13/23 2049

## 2023-01-13 NOTE — ED Notes (Signed)
RN confirmed with MD that he wanted a set of cultures from pt port. He confirms yes, RN to room to access pt, pt reports that this in not an average port and that blood can not be drawn from it , she reports its an access only for her to received her Pulmonary HTN medication. RN has informed MD, and he is aware.

## 2023-01-13 NOTE — Discharge Instructions (Signed)
You were seen in the emergency room for possible infection.  Your workup in the emergency room has been reassuring.  Your vital signs have been normal.  No signs of infection on your blood work.  We recommend that you call your pulmonary hypertension team and have them assess you for the port evaluation.  Return to the ER if you start having purulent drainage, fevers, chills, redness, pain at the port insertion site.

## 2023-01-14 ENCOUNTER — Other Ambulatory Visit (HOSPITAL_COMMUNITY): Payer: Self-pay

## 2023-01-18 ENCOUNTER — Other Ambulatory Visit (HOSPITAL_COMMUNITY): Payer: Self-pay

## 2023-01-18 LAB — CULTURE, BLOOD (ROUTINE X 2)
Culture: NO GROWTH
Culture: NO GROWTH
Special Requests: ADEQUATE
Special Requests: ADEQUATE

## 2023-01-19 DIAGNOSIS — Z2821 Immunization not carried out because of patient refusal: Secondary | ICD-10-CM | POA: Insufficient documentation

## 2023-01-20 NOTE — Assessment & Plan Note (Addendum)
Cholesterol levels are improving, continue focusing on diet low in fat. Continue statin, tolerating well

## 2023-01-20 NOTE — Assessment & Plan Note (Signed)
She is encouraged to strive for BMI less than 30 to decrease cardiac risk. Advised to aim for at least 150 minutes of exercise per week. [facility] I have advised her if she decides to stop going there at that time we could consider weight loss medications pending insurance approval. I have advised her to check with her insurance on coverage.

## 2023-01-20 NOTE — Assessment & Plan Note (Signed)

## 2023-01-20 NOTE — Assessment & Plan Note (Signed)
Hgba1c is normal range, continue focusing on healthy diet and regular exercise as tolerated.

## 2023-01-20 NOTE — Assessment & Plan Note (Signed)
Blood pressure is well controlled, continue current medications.

## 2023-01-20 NOTE — Assessment & Plan Note (Signed)
Influenza vaccine administered Encouraged to take Tylenol as needed for fever or muscle aches.

## 2023-01-21 ENCOUNTER — Ambulatory Visit (HOSPITAL_COMMUNITY)
Admission: RE | Admit: 2023-01-21 | Discharge: 2023-01-21 | Disposition: A | Discharge status: Home / Self Care | Payer: 59 | Source: Ambulatory Visit | Attending: Internal Medicine | Admitting: Internal Medicine

## 2023-01-21 ENCOUNTER — Encounter (HOSPITAL_COMMUNITY): Payer: Self-pay | Admitting: Internal Medicine

## 2023-01-21 ENCOUNTER — Other Ambulatory Visit (HOSPITAL_COMMUNITY): Payer: Self-pay

## 2023-01-21 VITALS — BP 158/84 | HR 98 | Wt 226.6 lb

## 2023-01-21 DIAGNOSIS — I2721 Secondary pulmonary arterial hypertension: Secondary | ICD-10-CM

## 2023-01-21 DIAGNOSIS — I10 Essential (primary) hypertension: Secondary | ICD-10-CM | POA: Diagnosis not present

## 2023-01-21 DIAGNOSIS — R002 Palpitations: Secondary | ICD-10-CM | POA: Diagnosis not present

## 2023-01-21 DIAGNOSIS — Z008 Encounter for other general examination: Secondary | ICD-10-CM | POA: Diagnosis present

## 2023-01-21 MED ORDER — LOSARTAN POTASSIUM 25 MG PO TABS
25.0000 mg | ORAL_TABLET | Freq: Every day | ORAL | 3 refills | Status: DC
  Filled 2023-01-21: qty 90, 90d supply, fill #0
  Filled 2023-08-13: qty 90, 90d supply, fill #1

## 2023-01-21 NOTE — Progress Notes (Signed)
ADVANCED HF CLINIC NOTE   Primary Care: Arnette Felts, FNP Primary Cardiologist: Dr. Cristal Deer HF Cardioliogist: Dr. Gala Romney  HPI: [name] is a 57 y.o.female with morbid obesity, pulmonary hypertension due iPAH (followed by Dr. Tresa Res at [facility]), RV failure, OSA/OHS on CPAP, breast CA, previous PE.   I saw her in 2020 as an initial consult and as her PAH was being closely managed at [facility], I had her f/u PRN. She was een by Dr. Cristal Deer on 12/09/20 and c/o 20 pound weight gain with worsening SOB so referred back here for further evaluation.  She continues to follow closely with Dr. Tresa Res. In 2017, had pulmonary angiogram that ruled out CTEPH. In 10/20 she was started on remodulin.  Now on remodulin pump, sildenafil 40 tid, macintentan 10 and sotatercept.  Echo 8/22 LVEF 60-65% RV moderately dilated and HK. TR jet inadequate to estimate RVSP.   She had RHC at [facility] 12/22 showing improved pressures (see below).   RA mean:  5  RV : 60/2/7  PA: 58/25 (37)         PCW:  9  PCWP 12  PA sat: 66%                  Arterial sat: 99%  CO/CI: 6.25 L/min /2.93 (Fick)  PVR: 4.48 Wood Units/ 358 dyn-sec/cm5 (Fick)  PVR: 3.34 Wood Units/ 267 dyn-sec/cm5 (Thermodilution)   RHC 4/23 - prior to sotatercept  RA mean:  8  PA: 64/30 (41)     PCWP 12  PA sat: 61%                  Arterial sat: 95%  CO/CI: 5.6 L/min /2.62 (Fick)  CO/CI: 6.6/2.6 (TD) PVR: 5.1 Wood Units/ 358 dyn-sec/cm5 (Fick)  PVR: 4.4 Wood Units/ 267 dyn-sec/cm5 (Thermodilution)   Has been following with Dr. Moshe Cipro at [facility] clinic. Last seen in 8/24. Doing pretty well. SOB with mild activity. Mild dependent edema, No orthopnea or PND. No syncope or presyncope Uses Bipap every night. Dr. 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   Menorrhagia    secondary to uterine fibroids   OSA (obstructive sleep apnea)    07/25/13 HST AHI 33/hr, severe hypoxemia O2 min 42% and 95% of the time <89%   PE (pulmonary embolism) 2014   bilateral   Prediabetes    Pulmonary artery hypertension (HCC)    Pulmonary nodule    (5mm on loeft lower lobe) found on CT scan July 2014, repeat scan Jan 2015 showed less than 4mm   Right ovarian cyst    noted 09/2012    S/P TAH (total abdominal hysterectomy) 06/07/2013   Sleep apnea    SOB (shortness of breath) on exertion    Stomach ulcer    Trichomoniasis    05/2011    Vitamin D deficiency     Current Outpatient  Medications  Medication Sig Dispense Refill   acetaminophen (TYLENOL) 500 MG tablet Take 1,000 mg by mouth every 6 (six) hours as needed for moderate pain or headache.      albuterol (ACCUNEB) 1.25 MG/3ML nebulizer solution Take 3 mLs (1.25 mg total) by nebulization every 6 (six) hours as needed for wheezing. 360 mL 12   albuterol (PROAIR HFA) 108 (90 Base) MCG/ACT inhaler Inhale 1-2 puffs into the lungs every 6 (six) hours as needed for wheezing or shortness of breath. 18 g 11   allopurinol (ZYLOPRIM) 300 MG tablet TAKE 1 TABLET BY MOUTH EVERY DAY NEED APPT 90 tablet 3   anastrozole (ARIMIDEX) 1 MG tablet TAKE 1 TABLET BY MOUTH EVERY DAY 90 tablet 3   atorvastatin (LIPITOR) 20 MG tablet Take 1 tablet (20 mg total) by mouth daily. 30 tablet 2   Azelastine HCl 137 MCG/SPRAY SOLN PLACE 1 SPRAY INTO BOTH NOSTRILS 2 (TWO) TIMES DAILY. USE IN EACH NOSTRIL AS DIRECTED 30 mL 4   chlorpheniramine-HYDROcodone (TUSSIONEX) 10-8 MG/5ML Take 5 mLs by mouth every 12 (twelve) hours as needed  for cough. 115 mL 0   clotrimazole-betamethasone (LOTRISONE) cream Apply 1 application topically 2 (two) times daily. Prn leg dermatitis 45 g 0   Colchicine (MITIGARE) 0.6 MG CAPS Take 1 capsule by mouth as needed. As needed gout flare max # of pills 2 in 24 hours 30 capsule 11   fluticasone (FLONASE) 50 MCG/ACT nasal spray Place into both nostrils daily as needed for allergies or rhinitis.     Fluticasone-Salmeterol (ADVAIR HFA IN) Inhale 1 puff into the lungs as needed.     ibuprofen (ADVIL) 800 MG tablet Take 1 tablet (800 mg total) by mouth every 8 (eight) hours as needed. 90 tablet 1   loratadine (CLARITIN) 10 MG tablet Take 1 tablet (10 mg total) by mouth daily. 90 tablet 3   Magnesium Cl-Calcium Carbonate (SLOW MAGNESIUM/CALCIUM) 70-117 MG TBEC Take 2 tablets by mouth in the morning and at bedtime.     metoprolol tartrate (LOPRESSOR) 25 MG tablet TAKE 1 TABLET BY MOUTH TWICE A DAY 180 tablet 3   Multiple Vitamin (MULTIVITAMIN) tablet Take 1 tablet by mouth daily.     omeprazole (PRILOSEC) 40 MG capsule Take 40 mg by mouth as needed.     OPSUMIT 10 MG TABS Take 10 mg by mouth daily.  8   Polyethyl Glycol-Propyl Glycol (SYSTANE OP) Place 1 drop into both eyes 3 (three) times daily as needed (dry/irritated eyes.).     potassium chloride SA (KLOR-CON) 20 MEQ tablet Take 1 tablet (20 mEq total) by mouth daily.     rivaroxaban (XARELTO) 20 MG TABS tablet Take 20 mg by mouth daily with supper.     sildenafil (REVATIO) 20 MG tablet Take 1-2 tablets (20-40 mg total) by mouth 3 (three) times daily. As of 07/03/20 taking 20 mg tid per [facility] Dr. Tresa Res 10 tablet 0   spironolactone (ALDACTONE) 50 MG tablet Take 1 tablet (50 mg total) by mouth daily. 90 tablet 3   tirzepatide (MOUNJARO) 7.5 MG/0.5ML Pen Inject 7.5 mg into the skin once a week. 2 mL 2   torsemide (DEMADEX) 20 MG tablet Take 20 mg by mouth daily as needed.     treprostinil (REMODULIN) 20 MG/20ML injection 20 ng/kg/min by Continuous  infusion (non-IV) route continuous.     triamcinolone cream (KENALOG) 0.1 % Apply 1 application topically daily as needed (leg discoloration). 454 g 2   Vitamin D, Ergocalciferol, (  DRISDOL) 1.25 MG (50000 UNIT) CAPS capsule Take 1 capsule (50,000 Units total) by mouth every 7 (seven) days. 5 capsule 0   WINREVAIR 2 x 45 MG subcutaneous injection Inject into the skin.     No current facility-administered medications for this encounter.   Facility-Administered Medications Ordered in Other Encounters  Medication Dose Route Frequency Provider Last Rate Last Admin   heparin lock flush 100 unit/mL  500 Units Intracatheter Once PRN Serena Croissant, MD       sodium chloride flush (NS) 0.9 % injection 10 mL  10 mL Intracatheter PRN Serena Croissant, MD        Allergies  Allergen Reactions   Ampicillin Other (See Comments)    Severe abdominal pain, dizziness (penicillin is okay)   Amoxicillin Other (See Comments)    LIGHT-HEADED and CLOSE TO "PASSING OUT"  AND ABDOMINAL PAIN Has patient had a PCN reaction causing immediate rash, facial/tongue/throat swelling, SOB or lightheadedness with hypotension: No Has patient had a PCN reaction causing severe rash involving mucus membranes or skin necrosis: No Has patient had a PCN reaction that required hospitalization No Has patient had a PCN reaction occurring within the last 10 years: No If all of the above answers are "NO", then may proceed with Cephalosporin use.   Bismuth-Containing Compounds    Indomethacin Other (See Comments)    Gastro irritation   Nsaids     GI ulcers   Penicillins    Sulfasalazine    Metronidazole Nausea And Vomiting   Sulfa Antibiotics Other (See Comments) and Rash    Very bad yeast infection      Social History   Socioeconomic History   Marital status: Single    Spouse name: Not on file   Number of children: 1   Years of education: Not on file   Highest education level: Not on file  Occupational History   Occupation:  Film/video editor, pharmacy tech  Tobacco Use   Smoking status: Former    Current packs/day: 0.00    Average packs/day: 0.3 packs/day for 10.0 years (3.3 ttl pk-yrs)    Types: Cigarettes    Start date: 10/03/2001    Quit date: 10/04/2011    Years since quitting: 11.3   Smokeless tobacco: Never   Tobacco comments:    No longer smoking cigarettes  Vaping Use   Vaping status: Never Used  Substance and Sexual Activity   Alcohol use: Yes    Alcohol/week: 0.0 standard drinks of alcohol    Comment: social   Drug use: No   Sexual activity: Not Currently  Other Topics Concern   Not on file  Social History Narrative   Single, lives alone with her children   Lives in [place]    worked Fish farm manager at [employer]    Occupation: Programmer, 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 [employer]   As of 2022 on disability due to pulm artery htn       Children: boys   Grew up in [place]   Social Determinants of Health   Financial Resource Strain: High Risk (09/09/2022)   Overall Financial Resource Strain (CARDIA)    Difficulty of Paying Living Expenses: Very hard  Food Insecurity: No Food Insecurity (09/09/2022)   Hunger Vital Sign    Worried About Running Out of Food in the Last Year: Never true    Ran Out of Food in the Last Year: Never true  Transportation Needs: No Transportation Needs (09/09/2022)   PRAPARE - Transportation  Lack of Transportation (Medical): No    Lack of Transportation (Non-Medical): No  Physical Activity: Sufficiently Active (09/09/2022)   Exercise Vital Sign    Days of Exercise per Week: 4 days    Minutes of Exercise per Session: 60 min  Stress: Stress Concern Present (09/09/2022)   [employer] of Occupational Health - Occupational Stress Questionnaire    Feeling of Stress : To some extent  Social Connections: Moderately Integrated (09/09/2022)   Social Connection and Isolation Panel [NHANES]    Frequency of Communication with Friends and Family: More than three times a week     Frequency of Social Gatherings with Friends and Family: More than three times a week    Attends Religious Services: More than 4 times per year    Active Member of [employer] or Organizations: Yes    Attends Banker Meetings: More than 4 times per year    Marital Status: Never married  Intimate Partner Violence: Not At Risk (09/09/2022)   Humiliation, Afraid, Rape, and Kick questionnaire    Fear of Current or Ex-Partner: No    Emotionally Abused: No    Physically Abused: No    Sexually Abused: No      Family History  Problem Relation Age of Onset   Hypertension Mother    Kidney disease Mother    Heart disease Mother    Alcoholism Mother    Hypertension Father    Heart disease Father    Stroke Sister    Hypertension Brother    Breast cancer Maternal Grandmother        died at 79   Breast cancer Paternal Grandmother    Cancer Maternal Aunt        unknown form   Breast cancer Paternal Aunt    Breast cancer Paternal Aunt    Breast cancer Cousin        pat first cousin dx in her 18s   Colon cancer Other 21       MGMs brother   Cancer Other        breast ca in GM   Cancer Other        g uncle colon or stomach ca   Wt Readings from Last 3 Encounters:  01/21/23 102.8 kg (226 lb 9.6 oz)  01/13/23 104.3 kg (230 lb)  01/11/23 102.4 kg (225 lb 12.8 oz)   BP (!) 158/84   Pulse 98   Wt 102.8 kg (226 lb 9.6 oz)   LMP 05/11/2013   SpO2 94%   BMI 37.71 kg/m   PHYSICAL EXAM: General:  Well appearing. No resp difficulty On O2 HEENT: normal Neck: supple. no JVD. Carotids 2+ bilat; no bruits. No lymphadenopathy or thryomegaly appreciated. Catheter site ok  Cor: PMI nondisplaced. Regular rate & rhythm. 2/6 TR Lungs: clear Abdomen: obese soft, nontender, nondistended. No hepatosplenomegaly. No bruits or masses. Good bowel sounds. Extremities: no cyanosis, clubbing, rash, edema Neuro: alert & orientedx3, cranial nerves grossly intact. moves all 4 extremities w/o difficulty.  Affect pleasant   ASSESSMENT & PLAN:  1. PAH  - WHO Group I due to iPAH. H/o PE but pulmonary angiography 2017 without chronic PE - Followed closely at [facility] on therapy with IV treprostinil (started 10/20), sildenafil 40 tid, sotatercept and macitentan 10 daily - Stable NYHA III - Volume status ok - Echo 8/22 LVEF 60-65% RV moderately dilated and HK. TR jet inadequate to estimate RVSP.  - RHC at [facility] (12/22) showed mPAP 37, PVR  3.3 WU, normal CO/CI, normal RA and PCW pressures.  - Echo (1/23) at [facility] EF 55%, RV ok - RHC 06/2021: RA 8, PA 64/30 (41), PCW 12  - We will defer further management of her PAH to Dr.LeVarge and the [facility] team. Agree with possible switch of IV remodulin to po selexapeg  2. OSA - continue BiPAP  3. Obesity - Body mass index is 37.71 kg/m. - Followed at [facility] - Continue Mounjaro  4. Palpitations/tachycardia - Zio 7/23 2 runs of SVT and NSVT. PVCs 2.4%  - continue metoprolol  5. HTN - BP elevated. Start losartan 25mg  day   Arvilla Meres, MD  2:22 PM

## 2023-01-21 NOTE — Patient Instructions (Signed)
Great to see you today!!!  START Losartan 25 mg Daily  Your physician recommends that you schedule a follow-up appointment in: 1 year, **PLEASE CALL OUR OFFICE IN AUGUST TO SCHEDULE THIS APPOINTMENT  If you have any questions or concerns before your next appointment please send [place] a message through [place] or call our office at [phone].    TO LEAVE A MESSAGE FOR THE NURSE SELECT OPTION 2, PLEASE LEAVE A MESSAGE INCLUDING: YOUR NAME DATE OF BIRTH CALL BACK NUMBER REASON FOR CALL**this is important as we prioritize the call backs  YOU WILL RECEIVE A CALL BACK THE SAME DAY AS LONG AS YOU CALL BEFORE 4:00 PM  At the [facility], you and your health needs are our priority. As part of our continuing mission to provide you with exceptional heart care, we have created designated Provider Care Teams. These Care Teams include your primary Cardiologist (physician) and Advanced Practice Providers (APPs- Physician Assistants and Nurse Practitioners) who all work together to provide you with the care you need, when you need it.   You may see any of the following providers on your designated Care Team at your next follow up: Dr Arvilla Meres Dr Marca Ancona Dr. Dorthula Nettles Dr. Clearnce Hasten Amy Filbert Schilder, NP Robbie Lis, [place] [facility] [place], [place] Brynda Peon, NP [place] Lee, NP Karle Plumber, PharmD   Please be sure to bring in all your medications bottles to every appointment.    Thank you for choosing [facility]

## 2023-01-27 ENCOUNTER — Encounter (INDEPENDENT_AMBULATORY_CARE_PROVIDER_SITE_OTHER): Payer: Self-pay | Admitting: Physician Assistant

## 2023-01-27 ENCOUNTER — Ambulatory Visit (INDEPENDENT_AMBULATORY_CARE_PROVIDER_SITE_OTHER): Payer: 59 | Admitting: Physician Assistant

## 2023-01-27 VITALS — BP 153/107 | HR 88 | Temp 98.3°F | Ht 65.0 in | Wt 225.0 lb

## 2023-01-27 DIAGNOSIS — E1129 Type 2 diabetes mellitus with other diabetic kidney complication: Secondary | ICD-10-CM

## 2023-01-27 DIAGNOSIS — Z7985 Long-term (current) use of injectable non-insulin antidiabetic drugs: Secondary | ICD-10-CM

## 2023-01-27 DIAGNOSIS — R809 Proteinuria, unspecified: Secondary | ICD-10-CM | POA: Diagnosis not present

## 2023-01-27 DIAGNOSIS — E669 Obesity, unspecified: Secondary | ICD-10-CM

## 2023-01-27 DIAGNOSIS — Z6837 Body mass index (BMI) 37.0-37.9, adult: Secondary | ICD-10-CM

## 2023-01-27 DIAGNOSIS — K5903 Drug induced constipation: Secondary | ICD-10-CM | POA: Diagnosis not present

## 2023-01-27 DIAGNOSIS — I272 Pulmonary hypertension, unspecified: Secondary | ICD-10-CM

## 2023-01-27 NOTE — Progress Notes (Signed)
.smr  Office: [phone]  /  Fax: [phone]  WEIGHT SUMMARY AND BIOMETRICS  Vitals Temp: 98.3 F (36.8 C) BP: (!) 153/107 Pulse Rate: 88 SpO2: 97 %   Anthropometric Measurements Height: 5\' 5"  (1.651 m) Weight: 225 lb (102.1 kg) BMI (Calculated): 37.44 Weight at Last Visit: 223 lb Weight Lost Since Last Visit: 0 Weight Gained Since Last Visit: 2 lb Starting Weight: 241 lb Total Weight Loss (lbs): 16 lb (7.258 kg) Peak Weight: 256 lb   Body Composition  Body Fat %: 50.1 % Fat Mass (lbs): 113.2 lbs Muscle Mass (lbs): 106.8 lbs Total Body Water (lbs): 81.6 lbs Visceral Fat Rating : 15   Other Clinical Data Fasting: no Labs: no Today's Visit #: 19 Starting Date: 07/24/21     HPI  Chief Complaint: OBESITY  [name] is here to discuss her progress with her obesity treatment plan. She is on the the Category 2 Plan and the Category 3 Plan and states she is following her eating plan approximately 85 % of the time. She states she is exercising exercise class 30  minutes  up to 5 times per week.   Interval History:  Since last office visit she is up 2 lbs.   She has struggled with her nutrition plan over the past several weeks due to illness related to line infection. Has been on multiple courses of antibiotics due to the line infection.  Also felt somewhat ill following her influenza shot.  She is working to get back on track with her nutrition plan. She has not felt like exercising as much lately either and has missed a few of her regular classes due to not feeling as well.  She was seen in the HF clinic 01/21/23 and reports Losartan was added to her medication regimen as her BP has been running much higher recently. She has intermittently been taking diuretics for edema, but does not take the medication regularly.   She has been tolerating Mounjaro .  She has been experiencing constipation, which she has been managing with stool softeners.  Despite these health  issues, the patient's weight has only increased slightly. She attributes this to decreased activity due to not feeling well. Her diet has remained largely unchanged, although she admits to occasional cravings and snacking.   Pharmacotherapy: Mounjaro 7.5 mg weekly. Occasional constipation managed with stool softner. Denies mass in neck, dysphagia, dyspepsia, persistent hoarseness, abdominal pain, or N/V or diarrhea. Has annual eye exam. Mood is stable.    TREATMENT PLAN FOR OBESITY: Obesity Slight weight gain noted, possibly due to decreased activity and dietary changes, recent illness with line infection/reaction to influenza vaccine. . -Encouraged to increase physical activity and maintain a protein intake and decrease simple carbohydrates.  -Continue Mounjaro 7.5mg  daily. Recommended Dietary Goals  [name] is currently in the action stage of change. As such, her goal is to continue weight management plan. She has agreed to the Category 2 Plan and the Category 3 Plan.  Behavioral Intervention  We discussed the following Behavioral Modification Strategies today: continue to work on maintaining a reduced calorie state, getting the recommended amount of protein, incorporating whole foods, making healthy choices, staying well hydrated and practicing mindfulness when eating..  Additional resources provided today: NA  Recommended Physical Activity Goals  [name] has been advised to work up to 150 minutes of moderate intensity aerobic activity a week and strengthening exercises 2-3 times per week for cardiovascular health, weight loss maintenance and preservation of muscle mass.   She  has agreed to Think about enjoyable ways to increase daily physical activity and overcoming barriers to exercise and Increase physical activity in their day and reduce sedentary time (increase NEAT).   Pharmacotherapy We discussed various medication options to help [name] with her weight loss efforts and we both  agreed to continue Mounjaro 7.5 mg weekly for Type 2 diabetes management. .    Return in about 4 weeks (around 02/24/2023).Marland Kitchen She was informed of the importance of frequent follow up visits to maximize her success with intensive lifestyle modifications for her multiple health conditions.  PHYSICAL EXAM:  Blood pressure (!) 153/107, pulse 88, temperature 98.3 F (36.8 C), height 5\' 5"  (1.651 m), weight 225 lb (102.1 kg), last menstrual period 05/11/2013, SpO2 97%. Body mass index is 37.44 kg/m.  General: She is overweight, cooperative, alert, well developed, and in no acute distress. PSYCH: Has normal mood, affect and thought process.   Cardiovascular: HR 80's BP 153/107- She has just started new BP medication today Lungs: Normal breathing effort, mild conversational dyspnea. On chronic oxygen therapy.   DIAGNOSTIC DATA REVIEWED:  BMET    Component Value Date/Time   NA 140 01/13/2023 1512   NA 141 12/30/2022 1204   NA 139 03/31/2017 1332   K 3.5 01/13/2023 1512   K 3.6 03/31/2017 1332   CL 103 01/13/2023 1512   CO2 27 01/13/2023 1512   CO2 26 03/31/2017 1332   GLUCOSE 90 01/13/2023 1512   GLUCOSE 129 03/31/2017 1332   BUN 15 01/13/2023 1512   BUN 11 12/30/2022 1204   BUN 18.1 03/31/2017 1332   CREATININE 0.63 01/13/2023 1512   CREATININE 0.68 10/26/2022 1058   CREATININE 1.0 03/31/2017 1332   CALCIUM 8.9 01/13/2023 1512   CALCIUM 9.2 03/31/2017 1332   GFRNONAA >60 01/13/2023 1512   GFRNONAA >60 10/26/2022 1058   GFRAA 83 03/05/2020 1409   Lab Results  Component Value Date   HGBA1C 5.2 11/12/2022   HGBA1C 6.2 (H) 06/09/2013   No results found for: "INSULIN" Lab Results  Component Value Date   TSH 0.82 07/03/2021   CBC    Component Value Date/Time   WBC 5.2 01/13/2023 1512   RBC 5.25 (H) 01/13/2023 1512   HGB 17.0 (H) 01/13/2023 1512   HGB 15.1 (H) 10/26/2022 1058   HGB 11.0 (L) 11/06/2020 1406   HGB 13.4 03/31/2017 1332   HCT 49.0 (H) 01/13/2023 1512   HCT  32.7 (L) 11/06/2020 1406   HCT 39.7 03/31/2017 1332   PLT 126 (L) 01/13/2023 1512   PLT 123 (L) 10/26/2022 1058   PLT 94 (LL) 11/06/2020 1406   MCV 93.3 01/13/2023 1512   MCV 92 11/06/2020 1406   MCV 99.3 03/31/2017 1332   MCH 32.4 01/13/2023 1512   MCHC 34.7 01/13/2023 1512   RDW 13.7 01/13/2023 1512   RDW 13.1 11/06/2020 1406   RDW 14.0 03/31/2017 1332   Iron Studies    Component Value Date/Time   IRON 81 12/23/2020 1045   IRON 33 (L) 06/14/2013 1141   TIBC 366 12/23/2020 1045   TIBC 441 06/14/2013 1141   FERRITIN 160 12/23/2020 1045   FERRITIN 88 06/14/2013 1141   IRONPCTSAT 22 12/23/2020 1045   IRONPCTSAT 16 08/30/2017 1648   Lipid Panel     Component Value Date/Time   CHOL 191 09/09/2022 1250   TRIG 121 09/09/2022 1250   HDL 49 09/09/2022 1250   CHOLHDL 3.9 09/09/2022 1250   CHOLHDL 4 07/03/2021 1138   VLDL  23.6 07/03/2021 1138   LDLCALC 120 (H) 09/09/2022 1250   Hepatic Function Panel     Component Value Date/Time   PROT 7.4 01/13/2023 1512   PROT 7.0 12/01/2022 1506   PROT 7.8 03/31/2017 1332   ALBUMIN 3.5 01/13/2023 1512   ALBUMIN 3.9 12/01/2022 1506   ALBUMIN 3.5 03/31/2017 1332   AST 15 01/13/2023 1512   AST 11 (L) 10/26/2022 1058   AST 16 03/31/2017 1332   ALT 12 01/13/2023 1512   ALT 9 10/26/2022 1058   ALT 14 03/31/2017 1332   ALKPHOS 70 01/13/2023 1512   ALKPHOS 96 03/31/2017 1332   BILITOT 0.8 01/13/2023 1512   BILITOT 0.7 12/01/2022 1506   BILITOT 0.7 10/26/2022 1058   BILITOT 0.97 03/31/2017 1332   BILIDIR 0.2 12/31/2015 1315      Component Value Date/Time   TSH 0.82 07/03/2021 1138   Nutritional Lab Results  Component Value Date   VD25OH 22.1 (L) 12/30/2022   VD25OH 30.9 11/12/2022   VD25OH 43.0 07/13/2022    ASSOCIATED CONDITIONS ADDRESSED TODAY  ASSESSMENT AND PLAN  Problem List Items Addressed This Visit     Pulmonary HTN (HCC)   Type 2 diabetes mellitus with microalbuminuria, without long-term current use of insulin  (HCC) - Primary   BMI 37.0-37.9, adult   Obesity (HCC)-start bmi 40.10   Drug-induced constipation   Type 2 Diabetes Mellitus with other specified complication, without long-term current use of insulin HgbA1c is at goal. Last A1c was 5.2  Medication(s): Mounjaro 7.5 mg SQ weekly She is working  on nutrition plan to decrease simple carbohydrates, increase lean proteins and exercise to promote weight loss and improve glycemic control .   Lab Results  Component Value Date   HGBA1C 5.2 11/12/2022   HGBA1C 5.1 09/09/2022   HGBA1C 5.3 04/30/2022   Lab Results  Component Value Date   MICROALBUR 6.4 07/03/2021   LDLCALC 120 (H) 09/09/2022   CREATININE 0.63 01/13/2023   Lab Results  Component Value Date   GFR 66.99 10/25/2020   GFR 46.84 (L) 01/11/2019   GFR 59.76 (L) 12/22/2018    Plan: Continue Mounjaro 7.5 mg SQ weekly Has 2 refills on prescription .  Consider increasing Mounjaro next visit if continued weight gain and continues to note increased cravings.  Continue working  on nutrition plan to decrease simple carbohydrates, increase lean proteins and exercise to promote weight loss and improve glycemic control .   Hypertension/PAH Elevated blood pressure noted during visit. Recently started on Losartan but just picked up the medication earlier today. -Continue Losartan and other medications as noted as prescribed by Dr. Gala Romney and [name] at [name]- Dr[facility]LeVarge  -Monitor blood pressure at home and follow up with clinics as instructed.   Constipation Reported constipation possibly related to [name] use. -Continue stool softeners as needed. -Consider adding Miralax for additional relief.    General Health Maintenance -Continue current vitamin regimen. -Next appointment scheduled for 02/18/2023.  ATTESTASTION STATEMENTS:  Reviewed by clinician on day of visit: allergies, medications, problem list, medical history, surgical history, family history, social history,  and previous encounter notes.   I have personally spent 43 minutes total time today in preparation, patient care, nutritional counseling and documentation for this visit, including the following: review of clinical lab tests; review of medical tests/procedures/services.      Kodee Drury, PA-C

## 2023-02-09 ENCOUNTER — Other Ambulatory Visit (HOSPITAL_COMMUNITY): Payer: Self-pay

## 2023-02-18 ENCOUNTER — Ambulatory Visit (INDEPENDENT_AMBULATORY_CARE_PROVIDER_SITE_OTHER): Payer: 59 | Admitting: Physician Assistant

## 2023-02-18 ENCOUNTER — Other Ambulatory Visit (HOSPITAL_COMMUNITY): Payer: Self-pay

## 2023-02-18 ENCOUNTER — Ambulatory Visit (INDEPENDENT_AMBULATORY_CARE_PROVIDER_SITE_OTHER): Payer: 59 | Admitting: Family Medicine

## 2023-02-18 ENCOUNTER — Encounter (INDEPENDENT_AMBULATORY_CARE_PROVIDER_SITE_OTHER): Payer: Self-pay | Admitting: Family Medicine

## 2023-02-18 VITALS — BP 136/71 | HR 69 | Temp 97.3°F | Ht 65.0 in | Wt 227.0 lb

## 2023-02-18 DIAGNOSIS — Z7985 Long-term (current) use of injectable non-insulin antidiabetic drugs: Secondary | ICD-10-CM

## 2023-02-18 DIAGNOSIS — E119 Type 2 diabetes mellitus without complications: Secondary | ICD-10-CM | POA: Diagnosis not present

## 2023-02-18 DIAGNOSIS — E1129 Type 2 diabetes mellitus with other diabetic kidney complication: Secondary | ICD-10-CM

## 2023-02-18 DIAGNOSIS — E669 Obesity, unspecified: Secondary | ICD-10-CM

## 2023-02-18 DIAGNOSIS — Z6837 Body mass index (BMI) 37.0-37.9, adult: Secondary | ICD-10-CM | POA: Diagnosis not present

## 2023-02-18 MED ORDER — SEMAGLUTIDE (1 MG/DOSE) 4 MG/3ML ~~LOC~~ SOPN
1.0000 mg | PEN_INJECTOR | SUBCUTANEOUS | 0 refills | Status: DC
Start: 2023-02-18 — End: 2023-03-25
  Filled 2023-02-18: qty 3, 28d supply, fill #0

## 2023-02-18 NOTE — Progress Notes (Deleted)
   SUBJECTIVE:  Chief Complaint: Obesity  Interim History: ***  Josanna is here to discuss her progress with her obesity treatment plan. She is on the {HWW Weight Loss Plan:210964005} and states she {CHL AMB IS/IS NOT:210130109} following her eating plan approximately *** % of the time. She states she {CHL AMB IS/IS NOT:210130109} exercising *** minutes *** times per week.   OBJECTIVE: Visit Diagnoses: Problem List Items Addressed This Visit     Pulmonary HTN (HCC)   Type 2 diabetes mellitus with microalbuminuria, without long-term current use of insulin (HCC) - Primary   Vitamin D deficiency   Obesity (HCC)-start bmi 40.10   Drug-induced constipation    No data recorded No data recorded No data recorded No data recorded   ASSESSMENT AND PLAN:  Diet: Crislyn {CHL AMB IS/IS NOT:210130109} currently in the action stage of change. As such, her goal is to {HWW Weight Loss Efforts:210964006}. She {HAS HAS MVH:84696} agreed to {HWW Weight Loss Plan:210964005}.  Exercise: Darcey has been instructed {HWW Exercise:210964007} for weight loss and overall health benefits.   Behavior Modification:  We discussed the following Behavioral Modification Strategies today: {HWW Behavior Modification:210964008}. We discussed various medication options to help [facility] with her weight loss efforts and we both agreed to ***.  No follow-ups on file.Marland Kitchen She was informed of the importance of frequent follow up visits to maximize her success with intensive lifestyle modifications for her multiple health conditions.  Attestation Statements:   Reviewed by clinician on day of visit: allergies, medications, problem list, medical history, surgical history, family history, social history, and previous encounter notes.   Time spent on visit including pre-visit chart review and post-visit care and charting was *** minutes.    Emmitt Matthews, PA-C

## 2023-02-18 NOTE — Progress Notes (Signed)
.smr  Office: [phone]  /  Fax: [phone]  WEIGHT SUMMARY AND BIOMETRICS  Anthropometric Measurements Height: 5\' 5"  (1.651 m) Weight: 227 lb (103 kg) BMI (Calculated): 37.77 Weight at Last Visit: 225 lb Weight Lost Since Last Visit: 0 Weight Gained Since Last Visit: 2 lb Starting Weight: 241lb Total Weight Loss (lbs): 14 lb (6.35 kg) Peak Weight: 256 lb   Body Composition  Body Fat %: 50.5 % Fat Mass (lbs): 114.8 lbs Muscle Mass (lbs): 106.8 lbs Total Body Water (lbs): 83.2 lbs Visceral Fat Rating : 15   Other Clinical Data Fasting: no Labs: no Today's Visit #: 20 Starting Date: 07/24/21    Chief Complaint: OBESITY   History of Present Illness   The patient, diagnosed with type two diabetes and obesity, presents for a follow-up consultation. She reports adherence to a category two eating plan approximately 85% of the time and engages in an hour of exercise thrice weekly. Despite these efforts, she has gained two pounds since the last visit three weeks ago. However, bioimpedance scale results suggest this weight gain is due to fluid retention rather than fat accumulation.  The patient recently celebrated a birthday, which involved some deviation from her usual dietary habits. She plans to manage her diet during the upcoming Thanksgiving holiday by practicing portion control and focusing on protein intake.  Regarding her exercise regimen, the patient participates in a silver sneakers class, which includes stretching, balance exercises, cardio, and weight training. She reports feeling refreshed after these sessions and has the flexibility to adjust the intensity of her workouts as needed.  The patient is currently on Mounjaro for her type two diabetes. She expresses a desire to switch medications due to a perceived plateau in her progress, despite having tried a higher dose of Mounjaro in the past which resulted in nausea and decreased appetite. The patient has  previously tried Ozempic with limited weight loss results but is open to trying it again. Her most recent A1c, conducted in August, was 5.2.          PHYSICAL EXAM:  Blood pressure 136/71, pulse 69, temperature (!) 97.3 F (36.3 C), height 5\' 5"  (1.651 m), weight 227 lb (103 kg), last menstrual period 05/11/2013, SpO2 91%. Body mass index is 37.77 kg/m.  DIAGNOSTIC DATA REVIEWED:  BMET    Component Value Date/Time   NA 140 01/13/2023 1512   NA 141 12/30/2022 1204   NA 139 03/31/2017 1332   K 3.5 01/13/2023 1512   K 3.6 03/31/2017 1332   CL 103 01/13/2023 1512   CO2 27 01/13/2023 1512   CO2 26 03/31/2017 1332   GLUCOSE 90 01/13/2023 1512   GLUCOSE 129 03/31/2017 1332   BUN 15 01/13/2023 1512   BUN 11 12/30/2022 1204   BUN 18.1 03/31/2017 1332   CREATININE 0.63 01/13/2023 1512   CREATININE 0.68 10/26/2022 1058   CREATININE 1.0 03/31/2017 1332   CALCIUM 8.9 01/13/2023 1512   CALCIUM 9.2 03/31/2017 1332   GFRNONAA >60 01/13/2023 1512   GFRNONAA >60 10/26/2022 1058   GFRAA 83 03/05/2020 1409   Lab Results  Component Value Date   HGBA1C 5.2 11/12/2022   HGBA1C 6.2 (H) 06/09/2013   No results found for: "INSULIN" Lab Results  Component Value Date   TSH 0.82 07/03/2021   CBC    Component Value Date/Time   WBC 5.2 01/13/2023 1512   RBC 5.25 (H) 01/13/2023 1512   HGB 17.0 (H) 01/13/2023 1512   HGB 15.1 (H) 10/26/2022  1058   HGB 11.0 (L) 11/06/2020 1406   HGB 13.4 03/31/2017 1332   HCT 49.0 (H) 01/13/2023 1512   HCT 32.7 (L) 11/06/2020 1406   HCT 39.7 03/31/2017 1332   PLT 126 (L) 01/13/2023 1512   PLT 123 (L) 10/26/2022 1058   PLT 94 (LL) 11/06/2020 1406   MCV 93.3 01/13/2023 1512   MCV 92 11/06/2020 1406   MCV 99.3 03/31/2017 1332   MCH 32.4 01/13/2023 1512   MCHC 34.7 01/13/2023 1512   RDW 13.7 01/13/2023 1512   RDW 13.1 11/06/2020 1406   RDW 14.0 03/31/2017 1332   Iron Studies    Component Value Date/Time   IRON 81 12/23/2020 1045   IRON 33 (L)  06/14/2013 1141   TIBC 366 12/23/2020 1045   TIBC 441 06/14/2013 1141   FERRITIN 160 12/23/2020 1045   FERRITIN 88 06/14/2013 1141   IRONPCTSAT 22 12/23/2020 1045   IRONPCTSAT 16 08/30/2017 1648   Lipid Panel     Component Value Date/Time   CHOL 191 09/09/2022 1250   TRIG 121 09/09/2022 1250   HDL 49 09/09/2022 1250   CHOLHDL 3.9 09/09/2022 1250   CHOLHDL 4 07/03/2021 1138   VLDL 23.6 07/03/2021 1138   LDLCALC 120 (H) 09/09/2022 1250   Hepatic Function Panel     Component Value Date/Time   PROT 7.4 01/13/2023 1512   PROT 7.0 12/01/2022 1506   PROT 7.8 03/31/2017 1332   ALBUMIN 3.5 01/13/2023 1512   ALBUMIN 3.9 12/01/2022 1506   ALBUMIN 3.5 03/31/2017 1332   AST 15 01/13/2023 1512   AST 11 (L) 10/26/2022 1058   AST 16 03/31/2017 1332   ALT 12 01/13/2023 1512   ALT 9 10/26/2022 1058   ALT 14 03/31/2017 1332   ALKPHOS 70 01/13/2023 1512   ALKPHOS 96 03/31/2017 1332   BILITOT 0.8 01/13/2023 1512   BILITOT 0.7 12/01/2022 1506   BILITOT 0.7 10/26/2022 1058   BILITOT 0.97 03/31/2017 1332   BILIDIR 0.2 12/31/2015 1315      Component Value Date/Time   TSH 0.82 07/03/2021 1138   Nutritional Lab Results  Component Value Date   VD25OH 22.1 (L) 12/30/2022   VD25OH 30.9 11/12/2022   VD25OH 43.0 07/13/2022     Assessment and Plan    Type 2 Diabetes Mellitus Type 2 diabetes mellitus, managed with Mounjaro. Patient reports weight loss plateau and desires to switch to Ozempic. Last A1c in August was 5.2, indicating excellent glycemic control. Patient experienced nausea with higher Mounjaro doses. Discussed switching to Ozempic due to insurance coverage and previous positive experience. Explained similar efficacy of Ozempic and Mounjaro for diabetes management. - Discontinue Mounjaro - Initiate Ozempic 1 mg weekly - Follow-up in December to assess response to Ozempic  Obesity Obesity with recent 2-pound weight gain, attributed to fluid retention per bioimpedance scale.  Patient follows a category two eating plan 85% of the time and exercises three times a week. Discussed portion control strategies for holiday gatherings and encouraged reassessment of hunger and fullness during meals. - Continue current exercise regimen - Continue category two eating plan - Encourage portion control during holiday meals - Monitor weight and fluid retention  General Health Maintenance Patient actively manages health with regular exercise and dietary modifications. Discussed maintaining these habits during the holiday season. - Encourage continued adherence to exercise and dietary plans - Reinforce strategies for managing holiday eating  Follow-up - Follow-up appointment in December - Send prescription to [facility] within the next  two hours.          She was informed of the importance of frequent follow up visits to maximize her success with intensive lifestyle modifications for her multiple health conditions.    Quillian Quince, MD

## 2023-03-04 ENCOUNTER — Other Ambulatory Visit (HOSPITAL_COMMUNITY): Payer: Self-pay

## 2023-03-04 ENCOUNTER — Ambulatory Visit: Payer: 59 | Admitting: Family Medicine

## 2023-03-04 ENCOUNTER — Encounter: Payer: Self-pay | Admitting: Family Medicine

## 2023-03-04 VITALS — BP 122/80 | HR 104 | Temp 98.0°F | Ht 65.0 in | Wt 235.0 lb

## 2023-03-04 DIAGNOSIS — E6609 Other obesity due to excess calories: Secondary | ICD-10-CM | POA: Diagnosis not present

## 2023-03-04 DIAGNOSIS — E66812 Obesity, class 2: Secondary | ICD-10-CM | POA: Diagnosis not present

## 2023-03-04 DIAGNOSIS — Z6839 Body mass index (BMI) 39.0-39.9, adult: Secondary | ICD-10-CM

## 2023-03-04 DIAGNOSIS — H9202 Otalgia, left ear: Secondary | ICD-10-CM | POA: Diagnosis not present

## 2023-03-04 DIAGNOSIS — R220 Localized swelling, mass and lump, head: Secondary | ICD-10-CM

## 2023-03-04 MED ORDER — LEVOFLOXACIN 500 MG PO TABS
500.0000 mg | ORAL_TABLET | Freq: Every day | ORAL | 0 refills | Status: AC
  Filled 2023-03-04: qty 5, 5d supply, fill #0

## 2023-03-04 MED ORDER — TRIAMCINOLONE ACETONIDE 40 MG/ML IJ SUSP
40.0000 mg | Freq: Once | INTRAMUSCULAR | Status: AC
Start: 2023-03-04 — End: 2023-03-04
  Administered 2023-03-04: 40 mg via INTRAMUSCULAR

## 2023-03-04 NOTE — Progress Notes (Addendum)
 I,Jameka J Llittleton, [name],acting as a Neurosurgeon for [employer], NP.,have documented all relevant documentation on the behalf of Tiffany Hose, NP,as directed by  Tiffany Hose, NP while in the presence of Tiffany Hose, NP.  Subjective:  Patient ID: [name] , female    DOB: 03/13/1966 , 57 y.o.   MRN: [id number]  Chief Complaint  Patient presents with   Facial Swelling    HPI  Patient is a 57 year old female who presents today for swelling on the left side of her face near her left ear. She states the left ear is painful as well and rates it a 5/10. Patient reports the pain started Monday but started to become more attentive to it yesterday because of  pain when she was washing her face. Patient is on  2L of Oxygen by [place]. She denies any fever, coughor nasal congestion. She denies any hearing loss also.     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   Menorrhagia    secondary to uterine fibroids   OSA (obstructive sleep apnea)    07/25/13 HST AHI 33/hr, severe hypoxemia O2 min 42% and 95% of the time <89%   PE (pulmonary embolism) 2014   bilateral   Prediabetes    Pulmonary artery hypertension (HCC)    Pulmonary nodule    (5mm on loeft lower lobe) found on CT scan July 2014, repeat scan Jan 2015 showed less than 4mm   Right ovarian cyst    noted 09/2012    S/P TAH (total abdominal hysterectomy) 06/07/2013   Sleep apnea    SOB (shortness of breath) on exertion    Stomach ulcer    Trichomoniasis    05/2011    Vitamin D deficiency      Family History  Problem Relation  Age of Onset   Hypertension Mother    Kidney disease Mother    Heart disease Mother    Alcoholism Mother    Hypertension Father    Heart disease Father    Stroke Sister    Hypertension Brother    Breast cancer Maternal Grandmother        died at 78   Breast cancer Paternal Grandmother    Cancer Maternal Aunt        unknown form   Breast cancer Paternal Aunt    Breast cancer Paternal Aunt    Breast cancer Cousin        Sequoyah Counterman first cousin dx in her 77s   Colon cancer Other 70       MGMs brother   Cancer Other        breast ca in [place]   Cancer Other        g uncle colon or stomach ca     Current Outpatient Medications:    acetaminophen (TYLENOL) 500 MG tablet, Take 1,000 mg by mouth every 6 (six) hours as needed for moderate pain or headache. , Disp: , Rfl:  albuterol (ACCUNEB) 1.25 MG/3ML nebulizer solution, Take 3 mLs (1.25 mg total) by nebulization every 6 (six) hours as needed for wheezing., Disp: 360 mL, Rfl: 12   albuterol (PROAIR HFA) 108 (90 Base) MCG/ACT inhaler, Inhale 1-2 puffs into the lungs every 6 (six) hours as needed for wheezing or shortness of breath., Disp: 18 g, Rfl: 11   allopurinol (ZYLOPRIM) 300 MG tablet, TAKE 1 TABLET BY MOUTH EVERY DAY NEED APPT, Disp: 90 tablet, Rfl: 3   anastrozole (ARIMIDEX) 1 MG tablet, TAKE 1 TABLET BY MOUTH EVERY DAY, Disp: 90 tablet, Rfl: 3   atorvastatin (LIPITOR) 20 MG tablet, Take 1 tablet (20 mg total) by mouth daily., Disp: 30 tablet, Rfl: 2   Azelastine HCl 137 MCG/SPRAY SOLN, PLACE 1 SPRAY INTO BOTH NOSTRILS 2 (TWO) TIMES DAILY. USE IN EACH NOSTRIL AS DIRECTED, Disp: 30 mL, Rfl: 4   chlorpheniramine-HYDROcodone (TUSSIONEX) 10-8 MG/5ML, Take 5 mLs by mouth every 12 (twelve) hours as needed for cough., Disp: 115 mL, Rfl: 0   clotrimazole-betamethasone (LOTRISONE) cream, Apply 1 application topically 2 (two) times daily. Prn leg dermatitis, Disp: 45 g, Rfl: 0   Colchicine (MITIGARE) 0.6 MG CAPS, Take 1 capsule by mouth as needed.  As needed gout flare max # of pills 2 in 24 hours, Disp: 30 capsule, Rfl: 11   fluticasone (FLONASE) 50 MCG/ACT nasal spray, Place into both nostrils daily as needed for allergies or rhinitis., Disp: , Rfl:    Fluticasone-Salmeterol (ADVAIR HFA IN), Inhale 1 puff into the lungs as needed., Disp: , Rfl:    ibuprofen (ADVIL) 800 MG tablet, Take 1 tablet (800 mg total) by mouth every 8 (eight) hours as needed., Disp: 90 tablet, Rfl: 1   loratadine (CLARITIN) 10 MG tablet, Take 1 tablet (10 mg total) by mouth daily., Disp: 90 tablet, Rfl: 3   losartan (COZAAR) 25 MG tablet, Take 1 tablet (25 mg total) by mouth daily., Disp: 90 tablet, Rfl: 3   Magnesium Cl-Calcium Carbonate (SLOW MAGNESIUM/CALCIUM) 70-117 MG TBEC, Take 2 tablets by mouth in the morning and at bedtime., Disp: , Rfl:    metoprolol tartrate (LOPRESSOR) 25 MG tablet, TAKE 1 TABLET BY MOUTH TWICE A DAY, Disp: 180 tablet, Rfl: 3   Multiple Vitamin (MULTIVITAMIN) tablet, Take 1 tablet by mouth daily., Disp: , Rfl:    omeprazole (PRILOSEC) 40 MG capsule, Take 40 mg by mouth as needed., Disp: , Rfl:    OPSUMIT 10 MG TABS, Take 10 mg by mouth daily., Disp: , Rfl: 8   Polyethyl Glycol-Propyl Glycol (SYSTANE OP), Place 1 drop into both eyes 3 (three) times daily as needed (dry/irritated eyes.)., Disp: , Rfl:    potassium chloride SA (KLOR-CON) 20 MEQ tablet, Take 1 tablet (20 mEq total) by mouth daily., Disp: , Rfl:    rivaroxaban (XARELTO) 20 MG TABS tablet, Take 20 mg by mouth daily with supper., Disp: , Rfl:    sildenafil (REVATIO) 20 MG tablet, Take 1-2 tablets (20-40 mg total) by mouth 3 (three) times daily. As of 07/03/20 taking 20 mg tid per [facility] pulm Dr. Tresa Res, Disp: 10 tablet, Rfl: 0   spironolactone (ALDACTONE) 50 MG tablet, Take 1 tablet (50 mg total) by mouth daily., Disp: 90 tablet, Rfl: 3   torsemide (DEMADEX) 20 MG tablet, Take 20 mg by mouth daily as needed., Disp: , Rfl:    treprostinil (REMODULIN) 20 MG/20ML injection, 20 ng/kg/min  by Continuous infusion (non-IV) route continuous., Disp: , Rfl:    triamcinolone cream (KENALOG) 0.1 %,  Apply 1 application topically daily as needed (leg discoloration)., Disp: 454 g, Rfl: 2   WINREVAIR 2 x 45 MG subcutaneous injection, Inject into the skin., Disp: , Rfl:    Semaglutide, 2 MG/DOSE, (OZEMPIC, 2 MG/DOSE,) 8 MG/3ML SOPN, Inject 2 mg into the skin once a week., Disp: 3 mL, Rfl: 1   Vitamin D, Ergocalciferol, (DRISDOL) 1.25 MG (50000 UNIT) CAPS capsule, Take 1 capsule (50,000 Units total) by mouth every 7 (seven) days. *Need office visit for refills*, Disp: 5 capsule, Rfl: 0 No current facility-administered medications for this visit.  Facility-Administered Medications Ordered in Other Visits:    heparin lock flush 100 unit/mL, 500 Units, Intracatheter, Once PRN, Serena Croissant, MD   sodium chloride flush (NS) 0.9 % injection 10 mL, 10 mL, Intracatheter, PRN, Serena Croissant, MD   Allergies  Allergen Reactions   Ampicillin Other (See Comments)    Severe abdominal pain, dizziness (penicillin is okay)   Amoxicillin Other (See Comments)    LIGHT-HEADED and CLOSE TO "PASSING OUT"  AND ABDOMINAL PAIN Has patient had a PCN reaction causing immediate rash, facial/tongue/throat swelling, SOB or lightheadedness with hypotension: No Has patient had a PCN reaction causing severe rash involving mucus membranes or skin necrosis: No Has patient had a PCN reaction that required hospitalization No Has patient had a PCN reaction occurring within the last 10 years: No If all of the above answers are "NO", then may proceed with Cephalosporin use.   Bismuth-Containing Compounds    Indomethacin Other (See Comments)    Gastro irritation   Nsaids     GI ulcers   Penicillins    Sulfasalazine    Metronidazole Nausea And Vomiting   Sulfa Antibiotics Other (See Comments) and Rash    Very bad yeast infection     Review of Systems  Constitutional:  Positive for chills. Negative for fatigue.        Tuesday had chills  HENT:  Positive for ear pain and facial swelling. Negative for hearing loss, sinus pressure and sinus pain.   Respiratory:  Negative for cough and stridor.   Cardiovascular: Negative.   Musculoskeletal: Negative.   Skin: Negative.   Neurological: Negative.   Psychiatric/Behavioral: Negative.       Today's Vitals   03/04/23 1034  BP: 122/80  Pulse: (!) 104  Temp: 98 F (36.7 C)  Weight: 235 lb (106.6 kg)  Height: 5\' 5"  (1.651 m)  PainSc: 5   PainLoc: Ear   Body mass index is 39.11 kg/m.  Wt Readings from Last 3 Encounters:  05/13/23 232 lb (105.2 kg)  04/22/23 231 lb (104.8 kg)  04/13/23 234 lb 12.8 oz (106.5 kg)    The 10-year ASCVD risk score (Arnett DK, et al., 2019) is: 19.5%   Values used to calculate the score:     Age: 82 years     Sex: Female     Is Non-Hispanic African American: Yes     Diabetic: Yes     Tobacco smoker: No     Systolic Blood Pressure: 143 mmHg     Is BP treated: Yes     HDL Cholesterol: 56 mg/dL     Total Cholesterol: 221 mg/dL  Objective:  Physical Exam HENT:     Right Ear: Hearing and tympanic membrane normal. No tenderness.     Left Ear: Hearing normal. Tenderness present. A middle ear effusion is present.  Cardiovascular:     Rate and Rhythm: Normal rate and regular rhythm.  Pulmonary:  Effort: Pulmonary effort is normal.     Breath sounds: Normal breath sounds.  Neurological:     Mental Status: She is alert.         Assessment And Plan:  Left ear pain -     Triamcinolone Acetonide  Facial swelling -     levoFLOXacin; Take 1 tablet (500 mg total) by mouth daily for 5 days.  Dispense: 5 tablet; Refill: 0  Class 2 obesity due to excess calories with body mass index (BMI) of 39.0 to 39.9 in adult, unspecified whether serious comorbidity present Assessment & Plan: She is encouraged to strive for BMI less than 30 to decrease cardiac risk. Advised to aim for at least 150 minutes of exercise per week.       No follow-ups on file.  Patient was given opportunity to ask questions. Patient verbalized understanding of the plan and was able to repeat key elements of the plan. All questions were answered to their satisfaction.    I, Tiffany Hose, NP, have reviewed all documentation for this visit. The documentation on 06/02/2023 for the exam, diagnosis, procedures, and orders are all accurate and complete.   IF YOU HAVE BEEN REFERRED TO A SPECIALIST, IT MAY TAKE 1-2 WEEKS TO SCHEDULE/PROCESS THE REFERRAL. IF YOU HAVE NOT HEARD FROM US/SPECIALIST IN TWO WEEKS, PLEASE GIVE [place] A CALL AT [phone].

## 2023-03-10 DIAGNOSIS — R220 Localized swelling, mass and lump, head: Secondary | ICD-10-CM | POA: Insufficient documentation

## 2023-03-10 DIAGNOSIS — E6609 Other obesity due to excess calories: Secondary | ICD-10-CM | POA: Insufficient documentation

## 2023-03-10 DIAGNOSIS — H9202 Otalgia, left ear: Secondary | ICD-10-CM | POA: Insufficient documentation

## 2023-03-19 ENCOUNTER — Other Ambulatory Visit (HOSPITAL_COMMUNITY): Payer: Self-pay

## 2023-03-19 ENCOUNTER — Other Ambulatory Visit (INDEPENDENT_AMBULATORY_CARE_PROVIDER_SITE_OTHER): Payer: Self-pay | Admitting: Family Medicine

## 2023-03-19 DIAGNOSIS — E1129 Type 2 diabetes mellitus with other diabetic kidney complication: Secondary | ICD-10-CM

## 2023-03-22 ENCOUNTER — Other Ambulatory Visit (HOSPITAL_COMMUNITY): Payer: Self-pay

## 2023-03-22 ENCOUNTER — Encounter (HOSPITAL_COMMUNITY): Payer: Self-pay

## 2023-03-25 ENCOUNTER — Encounter (INDEPENDENT_AMBULATORY_CARE_PROVIDER_SITE_OTHER): Payer: Self-pay | Admitting: Physician Assistant

## 2023-03-25 ENCOUNTER — Other Ambulatory Visit (HOSPITAL_COMMUNITY): Payer: Self-pay

## 2023-03-25 ENCOUNTER — Ambulatory Visit (INDEPENDENT_AMBULATORY_CARE_PROVIDER_SITE_OTHER): Payer: 59 | Admitting: Physician Assistant

## 2023-03-25 VITALS — BP 131/85 | HR 86 | Temp 98.7°F | Ht 65.0 in | Wt 230.0 lb

## 2023-03-25 DIAGNOSIS — I272 Pulmonary hypertension, unspecified: Secondary | ICD-10-CM

## 2023-03-25 DIAGNOSIS — R809 Proteinuria, unspecified: Secondary | ICD-10-CM

## 2023-03-25 DIAGNOSIS — Z6838 Body mass index (BMI) 38.0-38.9, adult: Secondary | ICD-10-CM

## 2023-03-25 DIAGNOSIS — Z7985 Long-term (current) use of injectable non-insulin antidiabetic drugs: Secondary | ICD-10-CM

## 2023-03-25 DIAGNOSIS — E559 Vitamin D deficiency, unspecified: Secondary | ICD-10-CM

## 2023-03-25 DIAGNOSIS — E1129 Type 2 diabetes mellitus with other diabetic kidney complication: Secondary | ICD-10-CM

## 2023-03-25 DIAGNOSIS — K5903 Drug induced constipation: Secondary | ICD-10-CM | POA: Diagnosis not present

## 2023-03-25 MED ORDER — SEMAGLUTIDE (1 MG/DOSE) 4 MG/3ML ~~LOC~~ SOPN
1.0000 mg | PEN_INJECTOR | SUBCUTANEOUS | 0 refills | Status: DC
  Filled 2023-03-25: qty 3, 28d supply, fill #0

## 2023-03-25 NOTE — Progress Notes (Signed)
SUBJECTIVE:  Chief Complaint: Obesity  Interim History: She is up 1 lb since her last visit. Bio impedence scale reviewed with the patient:  Muscle mass + 4 lbs Adipose mass - 1 lb Total body water - 1.6 lbs.   [name], a 57 year old individual with a history of type 2 diabetes and pulmonary artery hypertension, presents for a follow-up visit regarding her obesity treatment plan. She is chronically on oxygen via a binasal cannula and has been taking Ozempic 1 mg weekly.  The patient reports an increase in physical activity, including more vigorous exercise and walking, which has resulted in a gain of muscle mass. Despite a slight overall weight increase, the patient's adipose tissue has decreased, and her water weight is down almost two pounds.  The patient's pulmonary symptoms fluctuate with the weather, experiencing more difficulty breathing during colder temperatures. However, she reports feeling fine when staying indoors.  The patient has been adhering to her medication regimen, including Ozempic, without any reported side effects such as bowel irregularities or difficulty swallowing. She did, however, mention a recent craving for sweets, which is unusual for her.  The patient's blood pressure is close to the goal at 131/85, and her heart rate is 86. She is scheduled for a follow-up with her pulmonologist in the coming weeks.  [name] is here to discuss her progress with her obesity treatment plan. She is on the Category 2 Plan and states she is following her eating plan approximately 75 % of the time. She states she is exercising 60 minutes 3 times per week and walking more overall.   OBJECTIVE: Visit Diagnoses: Problem List Items Addressed This Visit     Pulmonary HTN (HCC)   Type 2 diabetes mellitus with microalbuminuria, without long-term current use of insulin (HCC) - Primary   Relevant Medications   Semaglutide, 1 MG/DOSE, 4 MG/3ML SOPN   Vitamin D deficiency    Obesity (HCC)-start bmi 40.10   Relevant Medications   Semaglutide, 1 MG/DOSE, 4 MG/3ML SOPN   Drug-induced constipation  Obesity Follow-up for obesity management. On Ozempic 1 mg weekly with positive results: muscle mass increased by 4 pounds, adipose tissue decreased by 1 pound, and water weight reduced by 2 pounds. More active, engaging in walking and other physical activities. No adverse effects from Ozempic, prefers it over [place] due to fewer gastrointestinal side effects. Discussed potential dose increase to 1.5 mg if weight loss plateaus. Encouraged to maintain protein intake to support muscle mass. - Continue Ozempic 1 mg weekly - Send prescription to [facility] - Encourage continued physical activity, including walking and online exercise videos - Monitor weight and muscle mass, reassess next month - Discuss potential dose increase to 1.5 mg if weight loss plateaus  Type 2 Diabetes Mellitus Type 2 diabetes managed with Ozempic, beneficial for glycemic control. No new symptoms or complications reported. - Continue current diabetes management with Ozempic - Monitor blood glucose levels regularly   Constipation Cierrah notes constipation has significantly improved after switch from [place] to [employer].   Constipation is well controlled.  Plan: Continue increased fiber and water within fluid allowance. Monitor closely after switch from [facility] to Ozempic.   Pulmonary Arterial Hypertension Pulmonary arterial hypertension, on oxygen via binasal cannula. Reports variable breathing difficulties influenced by weather changes but feels fine indoors. No recent changes in oxygen requirements. Continues usual medications including anticoagulation with Xarelto.  Seen in HF clinic- Dr. Gala Romney 01/21/23- Losartan was added to her medication regimen as her BP had  been running much higher recently. She has intermittently been taking diuretics for edema, but does not take the medication  regularly. See notes.  BP good today 131/85. HR 80's  - Continue current oxygen therapy at 2-3 L/min, increase to 6 L/min during physical activity - Follow up with pulmonologist -January as directed.  Continue to work on nutrition plan and exercise to promote weight loss.    Vitamin D Deficiency Vitamin D is not at goal of 50.  Most recent vitamin D level was 22.1. She is on  prescription ergocalciferol 50,000 IU weekly. Lab Results  Component Value Date   VD25OH 22.1 (L) 12/30/2022   VD25OH 30.9 11/12/2022   VD25OH 43.0 07/13/2022   Plan: Continue  prescription ergocalciferol 50,000 IU weekly Low vitamin D levels can be associated with adiposity and may result in leptin resistance and weight gain. Also associated with fatigue. Currently on vitamin D supplementation without any adverse effects.  Reports does not need refill currently as other provider is providing now.   General Health Maintenance Blood pressure well-controlled at 131/85 mmHg, heart rate 86 bpm. Advised to maintain a balanced diet with adequate protein intake to support muscle mass. - Encourage a balanced diet with sufficient protein, including fish, beans, and green leafy vegetables - Continue vitamin D supplementation as prescribed Low vitamin D levels can be associated with adiposity and may result in leptin resistance and weight gain. Also associated with fatigue. Currently on vitamin D supplementation without any adverse effects.   Follow-up - Follow up on January 23rd - Schedule an additional follow-up in mid-February to ensure continuity of care.  Vitals Temp: 98.7 F (37.1 C) BP: 131/85 Pulse Rate: 86 SpO2: 97 %   Anthropometric Measurements Height: 5\' 5"  (1.651 m) Weight: 230 lb (104.3 kg) BMI (Calculated): 38.27 Weight at Last Visit: 227lb Weight Lost Since Last Visit: 0lb Weight Gained Since Last Visit: 3lb Starting Weight: 241lb Total Weight Loss (lbs): 11 lb (4.99 kg) Peak Weight:  256lb   Body Composition  Body Fat %: 49.4 % Fat Mass (lbs): 113.8 lbs Muscle Mass (lbs): 110.8 lbs Total Body Water (lbs): 81.6 lbs Visceral Fat Rating : 15   Other Clinical Data Fasting: no Labs: no Today's Visit #: 21 Starting Date: 07/24/21     ASSESSMENT AND PLAN:  Diet: [place] is currently in the action stage of change. As such, her goal is to continue with weight loss efforts. She has agreed to Category 2 Plan.  Exercise: Ch[place]al has been instructed to work up to a goal of 150 minutes of combined cardio and strengthening exercise per week for weight loss and overall health benefits.   Behavior Modification:  We discussed the following Behavioral Modification Strategies today: increasing lean protein intake, decreasing simple carbohydrates, increasing vegetables, increase H2O intake, increase high fiber foods, no skipping meals, holiday eating strategies, and planning for success. We discussed various medication options to help [facility] with her weight loss efforts and we both agreed to continue Ozempic 1 mg weekly for Type 2 diabetes management.  Return in about 4 weeks (around 04/22/2023).Marland Kitchen She was informed of the importance of frequent follow up visits to maximize her success with intensive lifestyle modifications for her multiple health conditions.  Attestation Statements:   Reviewed by clinician on day of visit: allergies, medications, problem list, medical history, surgical history, family history, social history, and previous encounter notes.   Time spent on visit including pre-visit chart review and post-visit care and charting was 32 minutes.  Siegfried Vieth, PA-C

## 2023-03-26 LAB — HM MAMMOGRAPHY

## 2023-03-29 ENCOUNTER — Encounter: Payer: Self-pay | Admitting: Nurse Practitioner

## 2023-04-12 NOTE — Progress Notes (Signed)
 LILLETTE Kristeen JINNY Gladis, [name],acting as a neurosurgeon for Gaines Ada, FNP.,have documented all relevant documentation on the behalf of Gaines Ada, FNP,as directed by  Gaines Ada, FNP while in the presence of Gaines Ada, FNP.  Subjective:  Patient ID: [name] , female    DOB: Aug 10, 1965 , 58 y.o.   MRN: [id number]  Chief Complaint  Patient presents with   Diabetes    HPI  Patient presents today for a dm follow up, Patient reports compliance with medication. Patient denies any chest pain, SOB, or headaches. Patient has no concerns today. She continues to have sinus congestion. She also reports having vertigo after being sick as well. It has not been as bad as it was the first time. She does take Zyrtec  daily. Continues to go to [facility]. She reports Dr. Bettyjane started her on losartan  at her last visit with him due to her blood pressure was elevated. She wants to go back on telmisartan  vs losartan . She feels she was taken off when they had the recall. She reports she did have breast cancer in 2018 left breast.   She is now on Ozempic  due to not losing weight with Mounjaro  - she was getting nauseated for one month so switched to Ozempic . She is not waking up as hungry with the Ozempic . She has not been exercising as much since the new year. She is going to the [facility] for her exercising.      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   Menorrhagia    secondary to uterine fibroids   OSA (obstructive sleep apnea)    07/25/13 HST AHI 33/hr, severe hypoxemia O2  min 42% and 95% of the time <89%   PE (pulmonary embolism) 2014   bilateral   Prediabetes    Pulmonary artery hypertension (HCC)    Pulmonary nodule    (5mm on loeft lower lobe) found on CT scan July 2014, repeat scan Jan 2015 showed less than 4mm   Right ovarian cyst    noted 09/2012    S/P TAH (total abdominal hysterectomy) 06/07/2013   Sleep apnea    SOB (shortness of breath) on exertion    Stomach ulcer    Trichomoniasis    05/2011    Vitamin D  deficiency      Family History  Problem Relation Age of Onset   Hypertension Mother    Kidney disease Mother    Heart disease Mother    Alcoholism Mother    Hypertension Father    Heart disease Father    Stroke Sister    Hypertension Brother    Breast cancer Maternal Grandmother        died at 42   Breast cancer Paternal Grandmother    Cancer Maternal Aunt        unknown form   Breast cancer Paternal Aunt    Breast cancer Paternal Aunt    Breast cancer Cousin  pat first cousin dx in her 73s   Colon cancer Other 53       MGMs brother   Cancer Other        breast ca in [place]   Cancer Other        g uncle colon or stomach ca     Current Outpatient Medications:    acetaminophen  (TYLENOL ) 500 MG tablet, Take 1,000 mg by mouth every 6 (six) hours as needed for moderate pain or headache. , Disp: , Rfl:    albuterol  (ACCUNEB ) 1.25 MG/3ML nebulizer solution, Take 3 mLs (1.25 mg total) by nebulization every 6 (six) hours as needed for wheezing., Disp: 360 mL, Rfl: 12   albuterol  (PROAIR  HFA) 108 (90 Base) MCG/ACT inhaler, Inhale 1-2 puffs into the lungs every 6 (six) hours as needed for wheezing or shortness of breath., Disp: 18 g, Rfl: 11   allopurinol  (ZYLOPRIM ) 300 MG tablet, TAKE 1 TABLET BY MOUTH EVERY DAY NEED APPT, Disp: 90 tablet, Rfl: 3   anastrozole  (ARIMIDEX ) 1 MG tablet, TAKE 1 TABLET BY MOUTH EVERY DAY, Disp: 90 tablet, Rfl: 3   atorvastatin  (LIPITOR) 20 MG tablet, Take 1 tablet (20 mg total) by mouth daily., Disp:  30 tablet, Rfl: 2   Azelastine  HCl 137 MCG/SPRAY SOLN, PLACE 1 SPRAY INTO BOTH NOSTRILS 2 (TWO) TIMES DAILY. USE IN EACH NOSTRIL AS DIRECTED, Disp: 30 mL, Rfl: 4   chlorpheniramine-HYDROcodone  (TUSSIONEX) 10-8 MG/5ML, Take 5 mLs by mouth every 12 (twelve) hours as needed for cough., Disp: 115 mL, Rfl: 0   clotrimazole -betamethasone  (LOTRISONE ) cream, Apply 1 application topically 2 (two) times daily. Prn leg dermatitis, Disp: 45 g, Rfl: 0   Colchicine  (MITIGARE ) 0.6 MG CAPS, Take 1 capsule by mouth as needed. As needed gout flare max # of pills 2 in 24 hours, Disp: 30 capsule, Rfl: 11   fluticasone  (FLONASE ) 50 MCG/ACT nasal spray, Place into both nostrils daily as needed for allergies or rhinitis., Disp: , Rfl:    Fluticasone -Salmeterol (ADVAIR  HFA IN), Inhale 1 puff into the lungs as needed., Disp: , Rfl:    ibuprofen  (ADVIL ) 800 MG tablet, Take 1 tablet (800 mg total) by mouth every 8 (eight) hours as needed., Disp: 90 tablet, Rfl: 1   loratadine  (CLARITIN ) 10 MG tablet, Take 1 tablet (10 mg total) by mouth daily., Disp: 90 tablet, Rfl: 3   losartan  (COZAAR ) 25 MG tablet, Take 1 tablet (25 mg total) by mouth daily., Disp: 90 tablet, Rfl: 3   Magnesium  Cl-Calcium  Carbonate (SLOW MAGNESIUM /CALCIUM ) 70-117 MG TBEC, Take 2 tablets by mouth in the morning and at bedtime., Disp: , Rfl:    metoprolol  tartrate (LOPRESSOR ) 25 MG tablet, TAKE 1 TABLET BY MOUTH TWICE A DAY, Disp: 180 tablet, Rfl: 3   Multiple Vitamin (MULTIVITAMIN) tablet, Take 1 tablet by mouth daily., Disp: , Rfl:    omeprazole  (PRILOSEC) 40 MG capsule, Take 40 mg by mouth as needed., Disp: , Rfl:    OPSUMIT  10 MG TABS, Take 10 mg by mouth daily., Disp: , Rfl: 8   Polyethyl Glycol-Propyl Glycol (SYSTANE OP), Place 1 drop into both eyes 3 (three) times daily as needed (dry/irritated eyes.)., Disp: , Rfl:    potassium chloride  SA (KLOR-CON ) 20 MEQ tablet, Take 1 tablet (20 mEq total) by mouth daily., Disp: , Rfl:    rivaroxaban  (XARELTO ) 20  MG TABS tablet, Take 20 mg by mouth daily with supper., Disp: , Rfl:    sildenafil  (REVATIO ) 20 MG tablet, Take 1-2 tablets (  20-40 mg total) by mouth 3 (three) times daily. As of 07/03/20 taking 20 mg tid per [facility] pulm Dr. Janie, Disp: 10 tablet, Rfl: 0   spironolactone  (ALDACTONE ) 50 MG tablet, Take 1 tablet (50 mg total) by mouth daily., Disp: 90 tablet, Rfl: 3   torsemide  (DEMADEX ) 20 MG tablet, Take 20 mg by mouth daily as needed., Disp: , Rfl:    treprostinil  (REMODULIN ) 20 MG/20ML injection, 20 ng/kg/min by Continuous infusion (non-IV) route continuous., Disp: , Rfl:    triamcinolone  cream (KENALOG ) 0.1 %, Apply 1 application topically daily as needed (leg discoloration)., Disp: 454 g, Rfl: 2   Vitamin D , Ergocalciferol , (DRISDOL ) 1.25 MG (50000 UNIT) CAPS capsule, Take 1 capsule (50,000 Units total) by mouth every 7 (seven) days., Disp: 5 capsule, Rfl: 0   WINREVAIR 2 x 45 MG subcutaneous injection, Inject into the skin., Disp: , Rfl:    polyethylene glycol powder (GLYCOLAX /MIRALAX ) 17 GM/SCOOP powder, Take 17 grams by mouth daily dissolved in 4 - 8 ounces of fluid for 30 doses., Disp: 476 g, Rfl: 0   Semaglutide , 2 MG/DOSE, (OZEMPIC , 2 MG/DOSE,) 8 MG/3ML SOPN, Inject 2 mg into the skin once a week., Disp: 3 mL, Rfl: 1 No current facility-administered medications for this visit.  Facility-Administered Medications Ordered in Other Visits:    heparin  lock flush 100 unit/mL, 500 Units, Intracatheter, Once PRN, Gudena, Vinay, MD   sodium chloride  flush (NS) 0.9 % injection 10 mL, 10 mL, Intracatheter, PRN, Gudena, Vinay, MD   Allergies  Allergen Reactions   Ampicillin Other (See Comments)    Severe abdominal pain, dizziness (penicillin is okay)   Amoxicillin Other (See Comments)    LIGHT-HEADED and CLOSE TO PASSING OUT  AND ABDOMINAL PAIN Has patient had a PCN reaction causing immediate rash, facial/tongue/throat swelling, SOB or lightheadedness with hypotension: No Has patient had a PCN  reaction causing severe rash involving mucus membranes or skin necrosis: No Has patient had a PCN reaction that required hospitalization No Has patient had a PCN reaction occurring within the last 10 years: No If all of the above answers are NO, then may proceed with Cephalosporin use.   Bismuth -Containing Compounds    Indomethacin  Other (See Comments)    Gastro irritation   Nsaids     GI ulcers   Penicillins    Sulfasalazine    Metronidazole  Nausea And Vomiting   Sulfa Antibiotics Other (See Comments) and Rash    Very bad yeast infection     Review of Systems  Constitutional: Negative.   HENT:  Positive for congestion (nasal congestion).   Respiratory: Negative.    Cardiovascular: Negative.   Neurological: Negative.   Psychiatric/Behavioral: Negative.       Today's Vitals   04/13/23 1110  BP: 130/78  Pulse: 86  Temp: 98.5 F (36.9 C)  TempSrc: Oral  Weight: 234 lb 12.8 oz (106.5 kg)  Height: 5' 5 (1.651 m)  PainSc: 0-No pain   Body mass index is 39.07 kg/m.  Wt Readings from Last 3 Encounters:  04/22/23 231 lb (104.8 kg)  04/13/23 234 lb 12.8 oz (106.5 kg)  03/25/23 230 lb (104.3 kg)     Objective:  Physical Exam Vitals reviewed.  Constitutional:      General: She is not in acute distress.    Appearance: Normal appearance. She is well-developed. She is obese.  HENT:     Head: Normocephalic and atraumatic.  Eyes:     Pupils: Pupils are equal, round, and reactive to light.  Cardiovascular:     Rate and Rhythm: Normal rate and regular rhythm.     Pulses: Normal pulses.     Heart sounds: Normal heart sounds. No murmur heard. Pulmonary:     Effort: Pulmonary effort is normal. No respiratory distress.     Breath sounds: Normal breath sounds. No wheezing.     Comments: Wearing Oxygen  via [place] Musculoskeletal:        General: Normal range of motion.  Skin:    General: Skin is warm and dry.     Capillary Refill: Capillary refill takes less than 2 seconds.   Neurological:     General: No focal deficit present.     Mental Status: She is alert and oriented to person, place, and time.     Cranial Nerves: No cranial nerve deficit.     Motor: No weakness.  Psychiatric:        Mood and Affect: Mood normal.        Behavior: Behavior normal.        Thought Content: Thought content normal.        Judgment: Judgment normal.         Assessment And Plan:  Mixed hyperlipidemia Assessment & Plan: Cholesterol levels are improving, continue focusing on diet low in fat. Continue statin, tolerating well  Orders: -     Lipid panel -     BMP8+eGFR  Abnormal glucose Assessment & Plan: Hgba1c is normal range, continue focusing on healthy diet and regular exercise as tolerated.   Orders: -     Hemoglobin A1c  Portal hypertension (HCC) -     BMP8+eGFR  Gastroesophageal reflux disease without esophagitis Assessment & Plan: Stable, continue current medications.  Continue limiting intake of high fatty foods and spicy foods   Malignant neoplasm of upper-inner quadrant of left breast in female, estrogen receptor positive (HCC) Assessment & Plan: She has no longer seeing an oncologist she says, she was diagnosed in 2018   Class 2 obesity due to excess calories with body mass index (BMI) of 39.0 to 39.9 in adult, unspecified whether serious comorbidity present Assessment & Plan: She is encouraged to strive for BMI less than 30 to decrease cardiac risk. Advised to aim for at least 150 minutes of exercise per week as tolerated    COVID-19 vaccination declined Assessment & Plan: Declines covid 19 vaccine. Discussed risk of covid 32 and if she changes her mind about the vaccine to call the office. Education has been provided regarding the importance of this vaccine but patient still declined. Advised may receive this vaccine at local pharmacy or Health Dept.or vaccine clinic. Aware to provide a copy of the vaccination record if obtained from local  pharmacy or Health Dept.  Encouraged to take multivitamin, vitamin d , vitamin c and zinc to increase immune system. Aware can call office if would like to have vaccine here at office. Verbalized acceptance and understanding.      Return for controlled DM check 4 months.  Patient was given opportunity to ask questions. Patient verbalized understanding of the plan and was able to repeat key elements of the plan. All questions were answered to their satisfaction.    LILLETTE Gaines Ada, FNP, have reviewed all documentation for this visit. The documentation on 04/13/23 for the exam, diagnosis, procedures, and orders are all accurate and complete.   IF YOU HAVE BEEN REFERRED TO A SPECIALIST, IT MAY TAKE 1-2 WEEKS TO SCHEDULE/PROCESS THE REFERRAL. IF YOU HAVE NOT HEARD FROM US /SPECIALIST  IN TWO WEEKS, PLEASE GIVE US  A CALL AT [phone].

## 2023-04-13 ENCOUNTER — Other Ambulatory Visit (INDEPENDENT_AMBULATORY_CARE_PROVIDER_SITE_OTHER): Payer: Self-pay | Admitting: Physician Assistant

## 2023-04-13 ENCOUNTER — Ambulatory Visit (INDEPENDENT_AMBULATORY_CARE_PROVIDER_SITE_OTHER): Payer: 59 | Admitting: Nurse Practitioner

## 2023-04-13 ENCOUNTER — Other Ambulatory Visit: Payer: Self-pay

## 2023-04-13 ENCOUNTER — Encounter: Payer: Self-pay | Admitting: Nurse Practitioner

## 2023-04-13 VITALS — BP 130/78 | HR 86 | Temp 98.5°F | Ht 65.0 in | Wt 234.8 lb

## 2023-04-13 DIAGNOSIS — E1129 Type 2 diabetes mellitus with other diabetic kidney complication: Secondary | ICD-10-CM

## 2023-04-13 DIAGNOSIS — K766 Portal hypertension: Secondary | ICD-10-CM | POA: Diagnosis not present

## 2023-04-13 DIAGNOSIS — C50212 Malignant neoplasm of upper-inner quadrant of left female breast: Secondary | ICD-10-CM

## 2023-04-13 DIAGNOSIS — Z2821 Immunization not carried out because of patient refusal: Secondary | ICD-10-CM

## 2023-04-13 DIAGNOSIS — E6609 Other obesity due to excess calories: Secondary | ICD-10-CM

## 2023-04-13 DIAGNOSIS — E782 Mixed hyperlipidemia: Secondary | ICD-10-CM | POA: Diagnosis not present

## 2023-04-13 DIAGNOSIS — K219 Gastro-esophageal reflux disease without esophagitis: Secondary | ICD-10-CM

## 2023-04-13 DIAGNOSIS — Z6839 Body mass index (BMI) 39.0-39.9, adult: Secondary | ICD-10-CM

## 2023-04-13 DIAGNOSIS — E66812 Obesity, class 2: Secondary | ICD-10-CM

## 2023-04-13 DIAGNOSIS — Z170 Estrogen receptor positive status [ER+]: Secondary | ICD-10-CM

## 2023-04-13 DIAGNOSIS — R7309 Other abnormal glucose: Secondary | ICD-10-CM | POA: Diagnosis not present

## 2023-04-14 ENCOUNTER — Encounter (HOSPITAL_COMMUNITY): Payer: Self-pay

## 2023-04-14 ENCOUNTER — Other Ambulatory Visit (HOSPITAL_COMMUNITY): Payer: Self-pay

## 2023-04-14 LAB — BMP8+EGFR
BUN/Creatinine Ratio: 19 (ref 9–23)
BUN: 15 mg/dL (ref 6–24)
CO2: 25 mmol/L (ref 20–29)
Calcium: 8.9 mg/dL (ref 8.7–10.2)
Chloride: 104 mmol/L (ref 96–106)
Creatinine, Ser: 0.79 mg/dL (ref 0.57–1.00)
Glucose: 83 mg/dL (ref 70–99)
Potassium: 3.8 mmol/L (ref 3.5–5.2)
Sodium: 143 mmol/L (ref 134–144)
eGFR: 87 mL/min/{1.73_m2} (ref >59)

## 2023-04-14 LAB — LIPID PANEL
Chol/HDL Ratio: 3.9 {ratio} (ref 0.0–4.4)
Cholesterol, Total: 221 mg/dL — ABNORMAL HIGH (ref 100–199)
HDL: 56 mg/dL (ref >39)
LDL Chol Calc (NIH): 142 mg/dL — ABNORMAL HIGH (ref 0–99)
Triglycerides: 131 mg/dL (ref 0–149)
VLDL Cholesterol Cal: 23 mg/dL (ref 5–40)

## 2023-04-14 LAB — HEMOGLOBIN A1C
Est. average glucose Bld gHb Est-mCnc: 100 mg/dL
Hgb A1c MFr Bld: 5.1 % (ref 4.8–5.6)

## 2023-04-21 NOTE — Progress Notes (Unsigned)
SUBJECTIVE:  Chief Complaint: Obesity  Interim History: She is up 1 lb since her last visit.  Down 10 lbs overall.  TBW loss of 4.14%  [name], a 58 year old individual with a significant past medical history of type 2 diabetes and pulmonary hypertension, is seen for a follow-up visit regarding her obesity treatment plan. She is currently on ergocalciferol 50,000 units weekly for vitamin D deficiency and Ozempic 1 mg weekly for type 2 diabetes.  Over the holiday period, she had a slight weight gain of one pound, which she attributes to not overeating but indulging a little with her grandkids. She has had an increase in muscle mass, which she attributes to increased physical activity, specifically lifting her grandchildren.   Slight increase in blood pressure today, which she attributes to walking up and down the hall before her appointment. She has been managing well with the cold weather, limiting her outdoor activities due to the thin air and extreme cold. She continues on chronic oxygen therapy and is hopeful that with continued treatments for PAH and weight loss she may be able to taper off or decrease oxygen use at times.  She is due to have a follow up   She expressed frustration with her current weight loss progress, feeling as though she has hit a plateau. She reported a weight loss of up to 20 pounds in the past, but has since regained 10 pounds over the past few months. She expressed a desire to increase her exercise regimen from three to five days a week.  She reported no issues with her current medication regimen, including no nausea or constipation with her Ozempic injections. She expressed a willingness to increase her Ozempic dosage to help suppress cravings and appetite.  She also mentioned an upcoming heart catheterization scheduled for February 4th, with the hope of transitioning from IV to pill form medication for her PAH/chronic RHF/chronic respiratory failure. She  expressed excitement about this potential change, as it would allow her more freedom in her activities, such as swimming.  Overall, she expressed a strong desire to continue her weight loss journey, recognizing the benefits it would have on her overall health, particularly her oxygen levels and pulmonary hypertension.  [name] is here to discuss her progress with her obesity treatment plan. She is on the Category 2 Plan and states she is following her eating plan approximately 85 % of the time. She states she is exercising exercise classes 60 minutes 3 times per week.   OBJECTIVE: Visit Diagnoses: Problem List Items Addressed This Visit     Pulmonary HTN (HCC)   Type 2 diabetes mellitus with microalbuminuria, without long-term current use of insulin (HCC) - Primary   Relevant Medications   Semaglutide, 2 MG/DOSE, (OZEMPIC, 2 MG/DOSE,) 8 MG/3ML SOPN   Vitamin D deficiency   Obesity (HCC)-start bmi 40.10   Relevant Medications   Semaglutide, 2 MG/DOSE, (OZEMPIC, 2 MG/DOSE,) 8 MG/3ML SOPN   Drug-induced constipation   Relevant Medications   polyethylene glycol powder (GLYCOLAX/MIRALAX) 17 GM/SCOOP powder   Other Visit Diagnoses       BMI 38.0-38.9,adult Current BMI 38.44         Obesity 58 year old with obesity, weight plateau, and recent 10 lb regain. Current weight: 231 lbs (up from 221 lbs in August). On Ozempic 1 mg weekly. Discussed increasing Ozempic to 2 mg weekly to suppress cravings and appetite. Explained potential constipation from Ozempic and suggested stool softeners or MiraLAX as needed. - Increase Ozempic to 2  mg weekly - Follow up in 3 weeks to assess response - Consider stool softeners or MiraLAX for constipation  Type 2 Diabetes Mellitus Lab Results  Component Value Date   HGBA1C 5.1 04/13/2023   HGBA1C 5.2 11/12/2022   HGBA1C 5.1 09/09/2022   Lab Results  Component Value Date   MICROALBUR 6.4 07/03/2021   LDLCALC 142 (H) 04/13/2023   CREATININE 0.79  04/13/2023    Type 2 diabetes managed with Ozempic. Blood glucose control appears stable. Increasing Ozempic to 2 mg weekly may benefit diabetes management. - Continue/refill Ozempic, increase to 2 mg weekly She is working  on nutrition plan to decrease simple carbohydrates, increase lean proteins and exercise to promote weight loss and improve glycemic control .   Pulmonary Hypertension/HF/Chronic respiratory failure Pulmonary hypertension with well-managed symptoms. Scheduled for heart catheterization on February 4th per [facility] to assess condition and consider transition from IV to oral medication. Ozempic does not need to be stopped for the procedure. - Proceed with heart catheterization on February 4th - Monitor for changes in symptoms or breathing difficulties  Hypertension Elevated blood pressure during visit, possibly due to recent physical activity. Managed by cardiology heart failure/hypertension clinic. - Monitor blood pressure regularly - Continue current hypertension management plan  Vitamin D Deficiency Vitamin D deficiency managed with ergocalciferol 50,000 units weekly. No N/V or muscle weakness with Ergocalciferol Lab Results  Component Value Date   VD25OH 22.1 (L) 12/30/2022   VD25OH 30.9 11/12/2022   VD25OH 43.0 07/13/2022     - Continue ergocalciferol 50,000 units weekly Low vitamin D levels can be associated with adiposity and may result in leptin resistance and weight gain. Also associated with fatigue. Currently on vitamin D supplementation without any adverse effects.  Recheck vitamin D levels several times yearly to optimize supplementation/avoid over supplementation   General Health Maintenance Managing multiple chronic conditions with no new issues reported. Encouraged regular physical activity and healthy diet to support weight loss and overall health. - Ensure all medications are refilled as needed - Encourage regular physical activity and healthy  diet  Follow-up - Follow up in 3 weeks on February 13th at 11:30 AM.  Vitals Temp: 98.3 F (36.8 C) BP: (!) 143/84 Pulse Rate: 91 SpO2: 94 %   Anthropometric Measurements Height: 5\' 5"  (1.651 m) Weight: 231 lb (104.8 kg) BMI (Calculated): 38.44 Weight at Last Visit: 230 lb Weight Lost Since Last Visit: 0 Weight Gained Since Last Visit: 1 lb Starting Weight: 241 lb Total Weight Loss (lbs): 10 lb (4.536 kg) Peak Weight: 256 lb   Body Composition  Body Fat %: 49.2 % Fat Mass (lbs): 117.6 lbs Muscle Mass (lbs): 111.6 lbs Total Body Water (lbs): 80.2 lbs Visceral Fat Rating : 15   Other Clinical Data Fasting: yes Labs: no Today's Visit #: 22 Starting Date: 07/24/21     ASSESSMENT AND PLAN:  Diet: [name] is currently in the action stage of change. As such, her goal is to continue with weight loss efforts. She has agreed to Category 2 Plan.  Exercise: [name] has been instructed to work up to a goal of 150 minutes of combined cardio and strengthening exercise per week for weight loss and overall health benefits.   Behavior Modification:  We discussed the following Behavioral Modification Strategies today: increasing lean protein intake, decreasing simple carbohydrates, increasing vegetables, increase H2O intake, increase high fiber foods, no skipping meals, meal planning and cooking strategies, better snacking choices, avoiding temptations, and planning for success. We discussed various  medication options to help [name] with her weight loss efforts and we both agreed to increase Ozempic to 2 mg weekly for Type 2 diabetes and continue to work on nutritional and behavioral strategies to promote weight loss.  .  Return in about 3 weeks (around 05/13/2023).Marland Kitchen She was informed of the importance of frequent follow up visits to maximize her success with intensive lifestyle modifications for her multiple health conditions.  Attestation Statements:   Reviewed by clinician on  day of visit: allergies, medications, problem list, medical history, surgical history, family history, social history, and previous encounter notes.   Time spent on visit including pre-visit chart review and post-visit care and charting was 35 minutes.    Demaris Bousquet, PA-C

## 2023-04-22 ENCOUNTER — Other Ambulatory Visit (HOSPITAL_COMMUNITY): Payer: Self-pay

## 2023-04-22 ENCOUNTER — Encounter: Payer: Self-pay | Admitting: Internal Medicine

## 2023-04-22 ENCOUNTER — Encounter (INDEPENDENT_AMBULATORY_CARE_PROVIDER_SITE_OTHER): Payer: Self-pay | Admitting: Physician Assistant

## 2023-04-22 ENCOUNTER — Ambulatory Visit (INDEPENDENT_AMBULATORY_CARE_PROVIDER_SITE_OTHER): Payer: 59 | Admitting: Physician Assistant

## 2023-04-22 VITALS — BP 143/84 | HR 91 | Temp 98.3°F | Ht 65.0 in | Wt 231.0 lb

## 2023-04-22 DIAGNOSIS — R809 Proteinuria, unspecified: Secondary | ICD-10-CM | POA: Diagnosis not present

## 2023-04-22 DIAGNOSIS — Z6838 Body mass index (BMI) 38.0-38.9, adult: Secondary | ICD-10-CM

## 2023-04-22 DIAGNOSIS — E1129 Type 2 diabetes mellitus with other diabetic kidney complication: Secondary | ICD-10-CM

## 2023-04-22 DIAGNOSIS — I272 Pulmonary hypertension, unspecified: Secondary | ICD-10-CM

## 2023-04-22 DIAGNOSIS — E559 Vitamin D deficiency, unspecified: Secondary | ICD-10-CM

## 2023-04-22 DIAGNOSIS — K5903 Drug induced constipation: Secondary | ICD-10-CM | POA: Diagnosis not present

## 2023-04-22 DIAGNOSIS — Z7985 Long-term (current) use of injectable non-insulin antidiabetic drugs: Secondary | ICD-10-CM

## 2023-04-22 DIAGNOSIS — E669 Obesity, unspecified: Secondary | ICD-10-CM

## 2023-04-22 MED ORDER — POLYETHYLENE GLYCOL 3350 17 GM/SCOOP PO POWD
17.0000 g | Freq: Every day | ORAL | 0 refills | Status: AC
Start: 2023-04-22 — End: 2023-05-22
  Filled 2023-04-22: qty 578, 30d supply, fill #0
  Filled 2023-04-22: qty 476, 28d supply, fill #0

## 2023-04-22 MED ORDER — OZEMPIC (2 MG/DOSE) 8 MG/3ML ~~LOC~~ SOPN
2.0000 mg | PEN_INJECTOR | SUBCUTANEOUS | 1 refills | Status: DC
Start: 2023-04-22 — End: 2023-05-13
  Filled 2023-04-22: qty 3, 28d supply, fill #0

## 2023-04-27 NOTE — Assessment & Plan Note (Signed)
Cholesterol levels are improving, continue focusing on diet low in fat. Continue statin, tolerating well

## 2023-04-27 NOTE — Assessment & Plan Note (Signed)
Stable, continue current medications.  Continue limiting intake of high fatty foods and spicy foods

## 2023-04-27 NOTE — Assessment & Plan Note (Signed)
She is encouraged to strive for BMI less than 30 to decrease cardiac risk. Advised to aim for at least 150 minutes of exercise per week as tolerated

## 2023-04-27 NOTE — Assessment & Plan Note (Signed)
Hgba1c is normal range, continue focusing on healthy diet and regular exercise as tolerated.

## 2023-04-27 NOTE — Assessment & Plan Note (Signed)
Declines covid 19 vaccine. Discussed risk of covid 20 and if she changes her mind about the vaccine to call the office. Education has been provided regarding the importance of this vaccine but patient still declined. Advised may receive this vaccine at local pharmacy or Health Dept.or vaccine clinic. Aware to provide a copy of the vaccination record if obtained from local pharmacy or Health Dept.  Encouraged to take multivitamin, vitamin d, vitamin c and zinc to increase immune system. Aware can call office if would like to have vaccine here at office. Verbalized acceptance and understanding.

## 2023-04-27 NOTE — Assessment & Plan Note (Signed)
She has no longer seeing an oncologist she says, she was diagnosed in 2018

## 2023-05-13 ENCOUNTER — Other Ambulatory Visit (HOSPITAL_COMMUNITY): Payer: Self-pay

## 2023-05-13 ENCOUNTER — Ambulatory Visit (INDEPENDENT_AMBULATORY_CARE_PROVIDER_SITE_OTHER): Payer: 59 | Admitting: Physician Assistant

## 2023-05-13 ENCOUNTER — Encounter (INDEPENDENT_AMBULATORY_CARE_PROVIDER_SITE_OTHER): Payer: Self-pay | Admitting: Physician Assistant

## 2023-05-13 ENCOUNTER — Encounter: Payer: Self-pay | Admitting: Internal Medicine

## 2023-05-13 VITALS — BP 143/88 | HR 73 | Temp 97.8°F | Ht 65.0 in | Wt 232.0 lb

## 2023-05-13 DIAGNOSIS — I272 Pulmonary hypertension, unspecified: Secondary | ICD-10-CM | POA: Diagnosis not present

## 2023-05-13 DIAGNOSIS — R809 Proteinuria, unspecified: Secondary | ICD-10-CM

## 2023-05-13 DIAGNOSIS — E1129 Type 2 diabetes mellitus with other diabetic kidney complication: Secondary | ICD-10-CM

## 2023-05-13 DIAGNOSIS — Z6838 Body mass index (BMI) 38.0-38.9, adult: Secondary | ICD-10-CM

## 2023-05-13 DIAGNOSIS — E669 Obesity, unspecified: Secondary | ICD-10-CM

## 2023-05-13 DIAGNOSIS — E559 Vitamin D deficiency, unspecified: Secondary | ICD-10-CM

## 2023-05-13 DIAGNOSIS — Z7985 Long-term (current) use of injectable non-insulin antidiabetic drugs: Secondary | ICD-10-CM

## 2023-05-13 MED ORDER — OZEMPIC (2 MG/DOSE) 8 MG/3ML ~~LOC~~ SOPN
2.0000 mg | PEN_INJECTOR | SUBCUTANEOUS | 1 refills | Status: DC
  Filled 2023-05-13: qty 3, 28d supply, fill #0

## 2023-05-13 MED ORDER — VITAMIN D (ERGOCALCIFEROL) 1.25 MG (50000 UNIT) PO CAPS
50000.0000 [IU] | ORAL_CAPSULE | ORAL | 0 refills | Status: DC
  Filled 2023-05-13: qty 4, 28d supply, fill #0

## 2023-05-13 NOTE — Progress Notes (Signed)
SUBJECTIVE: Discussed the use of AI scribe software for clinical note transcription with the patient, who gave verbal consent to proceed.  Chief Complaint: Obesity  Interim History: She is up 1 lb since last visit.   [name] is here to discuss her progress with her obesity treatment plan. She is on the Category 2 Plan and states she is following her eating plan approximately 85 % of the time. She states she is exercising 60 minutes 3 times per week.  [name] is a 58 year old female with obesity, type two diabetes, and pulmonary hypertension who presents for follow-up of her obesity treatment plan.  She has been on Ozempic 2 mg weekly for obesity management, preferring it over [facility] due to fewer side effects. She has lost two pounds in adipose tissue and gained one pound in water weight. Her weight increased over the holidays but has since decreased again following better adherence to her nutrition plan. She experiences gastrointestinal symptoms such as nausea and queasiness when consuming fatty foods, sometimes leading to urgent bowel movements and we discussed trying to avoid fatty foods . No current side effects from Ozempic otherwise, including constipation.   She has pulmonary hypertension and is transitioning from IV medication to a pill form, which will allow her to remove her line, a change she welcomes after dealing with it for three years. She is also supposed to be Xarelto for blood thinning but has not yet received the medication from the pharmacy and we discussed the importance of continuing the Xarelto .  She has a history of vitamin D deficiency and is taking vitamin D 50,000 units weekly. Her last vitamin D level was 22, which is still low. She craves vitamin D-rich foods like milk and yogurt. No issues with the vitamin D supplementation.  She mentions having grandchildren and describes an active role in their care, which can be tiring. She notes a decrease in her  usual exercise routine, missing some days, which she feels might be affecting her energy levels.     OBJECTIVE: Visit Diagnoses: Problem List Items Addressed This Visit     Pulmonary HTN (HCC)   Type 2 diabetes mellitus with microalbuminuria, without long-term current use of insulin (HCC) - Primary   Relevant Medications   Semaglutide, 2 MG/DOSE, (OZEMPIC, 2 MG/DOSE,) 8 MG/3ML SOPN   Vitamin D deficiency   Relevant Medications   Vitamin D, Ergocalciferol, (DRISDOL) 1.25 MG (50000 UNIT) CAPS capsule   Obesity (HCC)-start bmi 40.10   Relevant Medications   Semaglutide, 2 MG/DOSE, (OZEMPIC, 2 MG/DOSE,) 8 MG/3ML SOPN   Other Visit Diagnoses       BMI 38.0-38.9,adult Current BMI 38.6          Obesity [name], 57, is on Ozempic 2 mg weekly for Type 2 diabetes and obesity management. She prefers Ozempic over [place] due to fewer side effects. Despite reduced adipose tissue, she has gained water weight and her weight loss is less significant compared to [facility]. Discussed the impact of her pulmonary hypertension medications and Ozempic on metabolism and weight loss. Emphasized avoiding fatty foods to prevent gastrointestinal side effects. - Continue Ozempic 2 mg weekly - Order metabolic rate test for next visit - Schedule fasting tests for next visit   Type 2 Diabetes Mellitus On Ozempic 2 mg weekly.  Lab Results  Component Value Date   HGBA1C 5.1 04/13/2023   HGBA1C 5.2 11/12/2022   HGBA1C 5.1 09/09/2022   Lab Results  Component Value Date  MICROALBUR 6.4 07/03/2021   LDLCALC 142 (H) 04/13/2023   CREATININE 0.79 04/13/2023   Continue/refill Ozempic 2 mg weekly.  She is working  on nutrition plan to decrease simple carbohydrates, increase lean proteins and exercise to promote weight loss and improve glycemic control .   Pulmonary Hypertension [name] is transitioning from IV medication to oral form, which will allow for IV line removal and reduce infection risk.  She is on Xarelto for anticoagulation, but there was an issue with the prescription not being received and she was advised to follow up with her provider for the medication..  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 Low normal RA and PCW pressures, both with good respiratory variation. Normal cardiac output/index, though low SVI. In comparison to prior [facility] from 07/22/21, mPAP, RA, and PCW pressures are significantly lower, with similar PVR. Plan: Overall patient is appropriate for transition to oral treprostinil as has been desired, will start process for drug approvals. PVR is slightly higher than strict goals though this is also in the setting of hypovolemia with low SVI, anticipate that this would improve with adequate fluid bolus. Okay for home today.   The patient is anxious to try and get back into the pool once she is able to have her line removed.    Vitamin D Deficiency [name]'s vitamin D level is 22.  Last vitamin D Lab Results  Component Value Date   VD25OH 22.1 (L) 12/30/2022   She is on vitamin D 50,000 units weekly and reports no issues with supplementation. Discussed the importance of continuing supplementation and monitoring levels. - Continue/refill vitamin D- Ergocalciferol 50,000 units weekly - Recheck vitamin D levels at next visit  General Health Maintenance Discussed regular exercise, the impact of seasonal changes on energy levels, and the importance of vitamin D-rich foods. Consider B12 supplementation if needed. - Encourage intake of vitamin D-rich foods - Continue regular exercise - Monitor energy levels and consider B12 supplementation if needed  Follow-up - Schedule follow-up visit in 3-4 weeks.  - Perform fasting and breathing tests - Recheck labs including vitamin D and B12 levels. Vitals Temp: 97.8 F (36.6 C) BP: (!) 143/88 Pulse  Rate: 73 SpO2: 94 %   Anthropometric Measurements Height: 5\' 5"  (1.651 m) Weight: 232 lb (105.2 kg) BMI (Calculated): 38.61 Weight at Last Visit: 231lb Weight Lost Since Last Visit: 0 Weight Gained Since Last Visit: 1 Starting Weight: 241lb Total Weight Loss (lbs): 9 lb (4.082 kg) Peak Weight: 256lb   Body Composition  Body Fat %: 49.7 % Fat Mass (lbs): 115.6 lbs Muscle Mass (lbs): 111 lbs Total Body Water (lbs): 81.2 lbs Visceral Fat Rating : 15   Other Clinical Data Fasting: no Labs: no Today's Visit #: 23 Starting Date: 07/24/21     ASSESSMENT AND PLAN:  Diet: [name] is currently in the action stage of change. As such, her goal is to continue with weight loss efforts. She has agreed to Category 2 Plan.  Exercise: [name] has been instructed to continue exercising as is for weight loss and overall health benefits.   Behavior Modification:  We discussed the following Behavioral Modification Strategies today: increasing lean protein intake, decreasing simple carbohydrates, increasing vegetables, increase H2O intake, increase high fiber foods, no skipping meals, meal planning and cooking strategies, avoiding temptations, and planning for success. We discussed various medication options to help [facility] with her weight loss efforts and we  both agreed to continue Ozempic 2 mg weekly for Type 2 diabetes management and continue to work on nutritional and behavioral strategies to promote weight loss.  .  Return in about 4 weeks (around 06/10/2023) for Fasting Lab, Fasting IC.Marland Kitchen She was informed of the importance of frequent follow up visits to maximize her success with intensive lifestyle modifications for her multiple health conditions.  Attestation Statements:   Reviewed by clinician on day of visit: allergies, medications, problem list, medical history, surgical history, family history, social history, and previous encounter notes.   Time spent on visit including  pre-visit chart review and post-visit care and charting was 43 minutes.    Tiffany Mcgraw, PA-C

## 2023-05-18 ENCOUNTER — Ambulatory Visit (INDEPENDENT_AMBULATORY_CARE_PROVIDER_SITE_OTHER): Payer: 59 | Admitting: Physician Assistant

## 2023-06-02 NOTE — Assessment & Plan Note (Signed)
 She is encouraged to strive for BMI less than 30 to decrease cardiac risk. Advised to aim for at least 150 minutes of exercise per week.

## 2023-06-14 NOTE — Progress Notes (Unsigned)
 SUBJECTIVE: Discussed the use of AI scribe software for clinical note transcription with the patient, who gave verbal consent to proceed.  Chief Complaint: Obesity  Interim History: She is up 1 lb since last visit. She is going into the hospital next week for conversion from IV to po medications for PAH. Her port will be removed. Anticipates being in the hospital while medications being adjusted.   [name] is here to discuss her progress with her obesity treatment plan. She is on the Category 2 Plan and states she is following her eating plan approximately 80 % of the time. She states she is exercising 60 minutes 2 times per week. Not exercising as much as usual due to URI.   [name] is a 58 year old female who presents for follow-up of her obesity treatment plan.  She has been on Ozempic 2 mg weekly for obesity management and tolerates it well, with no significant side effects such as nausea, vomiting, or diarrhea. She occasionally experiences constipation, which she manages with increased vegetable and fruit intake.  She has pulmonary hypertension and has been experiencing increased breathing issues recently, which she attributes to worsening allergies. She has a productive cough with yellow sputum but no fever or chills. She is on a Z-Pak and prednisone as part of her routine management for such episodes and reports she is feeling better today.  She has a history of type 2 diabetes with microalbuminuria and is not on long-term insulin therapy. She does not regularly check her blood sugars and reports no significant issues with her current diabetes management which includes Ozempic 2 mg weekly  Her cholesterol levels were recently checked, showing a total cholesterol of 221, HDL of 56, triglycerides of 131, and an LDL higher than desired for her condition. She is on Lipitor 20 mg daily and reports no issues with this medication. She is mindful of her diet, avoiding red meat.  Her  vitamin D deficiency is managed with Calcifediol 50,000 units once weekly without side effects.   She occasionally exercises, reporting one to two sessions per week, but has been limited recently due to low energy. No fever, chills, nausea, vomiting, or diarrhea. Reports productive cough with yellow sputum, occasional constipation, and increased breathing issues attributed to allergies.  Plan Fasting IC next visit and patient informed to come in 30 minutes for testing before appointment.  Labs will be repeated while hospitalized to transition from IV to po meds for [facility].   OBJECTIVE: Visit Diagnoses: Problem List Items Addressed This Visit     Pulmonary HTN (HCC)   Relevant Medications   ORENITRAM 1 MG TBCR   ORENITRAM 5 MG TBCR   Type 2 diabetes mellitus with microalbuminuria, without long-term current use of insulin (HCC) - Primary   Relevant Medications   Semaglutide, 2 MG/DOSE, (OZEMPIC, 2 MG/DOSE,) 8 MG/3ML SOPN   Vitamin D deficiency   Relevant Medications   Vitamin D, Ergocalciferol, (DRISDOL) 1.25 MG (50000 UNIT) CAPS capsule   Obesity (HCC)-start bmi 40.10   Relevant Medications   Semaglutide, 2 MG/DOSE, (OZEMPIC, 2 MG/DOSE,) 8 MG/3ML SOPN   Other Visit Diagnoses       Hyperlipidemia associated with type 2 diabetes mellitus (HCC)       Relevant Medications   ORENITRAM 1 MG TBCR   ORENITRAM 5 MG TBCR   Semaglutide, 2 MG/DOSE, (OZEMPIC, 2 MG/DOSE,) 8 MG/3ML SOPN     Viral upper respiratory tract infection  BMI 38.0-38.9,adult Current BMI 38.8         Obesity [name] is on Ozempic 2 mg weekly for Type 2 diabetes management, tolerating it well without significant side effects. There is a slight increase in adipose tissue and a decrease in muscle mass, with stable water weight . She plans to increase physical activity to promote weight loss by increasing muscle mass, enhancing caloric expenditure at rest once she is feeling better with treatment for her URI. -  Continue Ozempic 2 mg weekly - Encourage increased physical activity to build muscle mass and promote weight loss Will plan to repeat fasting IC next visit to evaluate REE currently and appropriateness of current nutrition plan.   Type 2 Diabetes Mellitus with Microalbuminuria [name]'s type 2 diabetes with microalbuminuria is well-managed on Ozempic 2 mg weekly. She does not routinely check blood sugars. Her LDL cholesterol level is elevated, increasing cardiovascular and renal risk. Dietary habits and potential genetic factors contribute to her lipid profile. Lab Results  Component Value Date   HGBA1C 5.1 04/13/2023   HGBA1C 5.2 11/12/2022   HGBA1C 5.1 09/09/2022   Lab Results  Component Value Date   MICROALBUR 6.4 07/03/2021   LDLCALC 142 (H) 04/13/2023   CREATININE 0.79 04/13/2023   She is working  on nutrition plan to decrease simple carbohydrates, increase lean proteins and exercise to promote weight loss and improve glycemic control . - Consider increasing Lipitor dosage or exploring alternative cholesterol medications - Encourage dietary modifications to reduce saturated fat intake Meds ordered this encounter  Medications   Semaglutide, 2 MG/DOSE, (OZEMPIC, 2 MG/DOSE,) 8 MG/3ML SOPN    Sig: Inject 2 mg into the skin once a week.    Dispense:  3 mL    Refill:  1   Vitamin D, Ergocalciferol, (DRISDOL) 1.25 MG (50000 UNIT) CAPS capsule    Sig: Take 1 capsule (50,000 Units total) by mouth every 7 (seven) days. *Need office visit for refills*    Dispense:  5 capsule    Refill:  0    30 d supply;  ** OV for RF **   Do not send RF request    Hyperlipidemia [name]'s total cholesterol is 221 mg/dL, HDL is 56 mg/dL, triglycerides are 409 mg/dL, and LDL is elevated for a diabetic patient. She is on Lipitor 20 mg daily without issues. Dietary habits and potential genetic factors contribute to her lipid profile. The goal is to reduce LDL levels to decrease cardiovascular risk. Last  lipids Lab Results  Component Value Date   CHOL 221 (H) 04/13/2023   HDL 56 04/13/2023   LDLCALC 142 (H) 04/13/2023   TRIG 131 04/13/2023   CHOLHDL 3.9 04/13/2023   Continue to work on nutrition plan -decreasing simple carbohydrates, increasing lean proteins, decreasing saturated fats and cholesterol , avoiding trans fats and exercise as able to promote weight loss, improve lipids and decrease cardiovascular risks. - Consider increasing Lipitor dosage or exploring alternative cholesterol medications - Encourage dietary modifications to reduce saturated fat intake  Pulmonary Hypertension [name] is transitioning from IV to oral medication for pulmonary hypertension. She will be hospitalized for a week to monitor and titrate the new medication, aiming to improve quality of life by reducing dependence on IV therapy. -Transitioning from IV to oral medication Monitor following transition with pulmonary providers-[facility]  URI/Respiratory Symptoms [name] reports increased respiratory symptoms, including a productive cough with yellow sputum, but no fever or chills. She is on a Z-Pak and prednisone as part of  routine management for lung issues, closely monitored to prevent exacerbations. - Continue prescribed Z-Pak and prednisone as per providers instructions  Vitamin D Deficiency [name] is on Ergocalciferol 50,000 units once weekly for vitamin D deficiency. The last vitamin D level is not recent and a recheck is planned during her upcoming hospital visit. - Check vitamin D level during the upcoming hospital visit - Continue Ergocalciferol 50,000 units once weekly Meds ordered this encounter  Medications   Semaglutide, 2 MG/DOSE, (OZEMPIC, 2 MG/DOSE,) 8 MG/3ML SOPN    Sig: Inject 2 mg into the skin once a week.    Dispense:  3 mL    Refill:  1   Vitamin D, Ergocalciferol, (DRISDOL) 1.25 MG (50000 UNIT) CAPS capsule    Sig: Take 1 capsule (50,000 Units total) by mouth every 7 (seven) days. *Need  office visit for refills*    Dispense:  5 capsule    Refill:  0    30 d supply;  ** OV for RF **   Do not send RF request    Follow-up [name] requires follow-up for various tests and medication management. A metabolism test is planned to assess her metabolic rate and guide weight management strategies. - Schedule fasting labs and metabolism test on April 16th at 11:00 AM - Ensure fasting before the test, with no caffeine, only water - Refill Ozempic and other medications as needed  Vitals Temp: 98.8 F (37.1 C) BP: 133/87 Pulse Rate: 96 SpO2: 95 %   Anthropometric Measurements Height: 5\' 5"  (1.651 m) Weight: 233 lb (105.7 kg) BMI (Calculated): 38.77 Weight at Last Visit: 231 lb Weight Lost Since Last Visit: 0 Weight Gained Since Last Visit: 1 lb Starting Weight: 241 lb Total Weight Loss (lbs): 8 lb (3.629 kg) Peak Weight: 256 lb   Body Composition  Body Fat %: 50.3 % Fat Mass (lbs): 117.2 lbs Muscle Mass (lbs): 110.2 lbs Total Body Water (lbs): 81.4 lbs Visceral Fat Rating : 15   Other Clinical Data Fasting: no Labs: yes Today's Visit #: 24 Starting Date: 07/24/21     ASSESSMENT AND PLAN:  Diet: Koreen is currently in the action stage of change. As such, her goal is to continue with weight loss efforts and has agreed to the Category 2 Plan.   Exercise:  For substantial health benefits, adults should do at least 150 minutes (2 hours and 30 minutes) a week of moderate-intensity, or 75 minutes (1 hour and 15 minutes) a week of vigorous-intensity aerobic physical activity, or an equivalent combination of moderate- and vigorous-intensity aerobic activity. Aerobic activity should be performed in episodes of at least 10 minutes, and preferably, it should be spread throughout the week.  Behavior Modification:  We discussed the following Behavioral Modification Strategies today: increasing lean protein intake, decreasing simple carbohydrates, increasing vegetables,  increase H2O intake, increase high fiber foods, no skipping meals, avoiding temptations, and planning for success. We discussed various medication options to help [facility] with her weight loss efforts and we both agreed to continue Ozempic 2 mg weekly for Type 2 diabetes management.  Return in about 4 weeks (around 07/13/2023) for Fasting IC.Marland Kitchen She was informed of the importance of frequent follow up visits to maximize her success with intensive lifestyle modifications for her multiple health conditions.  Attestation Statements:   Reviewed by clinician on day of visit: allergies, medications, problem list, medical history, surgical history, family history, social history, and previous encounter notes.   Time spent on visit including pre-visit chart review  and post-visit care and charting was 37 minutes  Filip Luten,PA-C

## 2023-06-15 ENCOUNTER — Encounter (INDEPENDENT_AMBULATORY_CARE_PROVIDER_SITE_OTHER): Payer: Self-pay | Admitting: Physician Assistant

## 2023-06-15 ENCOUNTER — Encounter: Payer: Self-pay | Admitting: Internal Medicine

## 2023-06-15 ENCOUNTER — Ambulatory Visit (INDEPENDENT_AMBULATORY_CARE_PROVIDER_SITE_OTHER): Payer: 59 | Admitting: Physician Assistant

## 2023-06-15 ENCOUNTER — Other Ambulatory Visit (HOSPITAL_COMMUNITY): Payer: Self-pay

## 2023-06-15 VITALS — BP 133/87 | HR 96 | Temp 98.8°F | Ht 65.0 in | Wt 233.0 lb

## 2023-06-15 DIAGNOSIS — Z7985 Long-term (current) use of injectable non-insulin antidiabetic drugs: Secondary | ICD-10-CM

## 2023-06-15 DIAGNOSIS — I272 Pulmonary hypertension, unspecified: Secondary | ICD-10-CM | POA: Diagnosis not present

## 2023-06-15 DIAGNOSIS — E1129 Type 2 diabetes mellitus with other diabetic kidney complication: Secondary | ICD-10-CM | POA: Diagnosis not present

## 2023-06-15 DIAGNOSIS — J069 Acute upper respiratory infection, unspecified: Secondary | ICD-10-CM

## 2023-06-15 DIAGNOSIS — E559 Vitamin D deficiency, unspecified: Secondary | ICD-10-CM

## 2023-06-15 DIAGNOSIS — E669 Obesity, unspecified: Secondary | ICD-10-CM

## 2023-06-15 DIAGNOSIS — Z6838 Body mass index (BMI) 38.0-38.9, adult: Secondary | ICD-10-CM

## 2023-06-15 DIAGNOSIS — E1169 Type 2 diabetes mellitus with other specified complication: Secondary | ICD-10-CM

## 2023-06-15 DIAGNOSIS — E785 Hyperlipidemia, unspecified: Secondary | ICD-10-CM

## 2023-06-15 DIAGNOSIS — R809 Proteinuria, unspecified: Secondary | ICD-10-CM | POA: Diagnosis not present

## 2023-06-15 MED ORDER — VITAMIN D (ERGOCALCIFEROL) 1.25 MG (50000 UNIT) PO CAPS
50000.0000 [IU] | ORAL_CAPSULE | ORAL | 0 refills | Status: DC
Start: 2023-06-15 — End: 2023-10-26
  Filled 2023-06-15: qty 5, 35d supply, fill #0

## 2023-06-15 MED ORDER — OZEMPIC (2 MG/DOSE) 8 MG/3ML ~~LOC~~ SOPN
2.0000 mg | PEN_INJECTOR | SUBCUTANEOUS | 1 refills | Status: DC
  Filled 2023-06-15: qty 3, 28d supply, fill #0

## 2023-06-28 ENCOUNTER — Telehealth: Payer: Self-pay

## 2023-06-28 NOTE — Transitions of Care (Post Inpatient/ED Visit) (Signed)
 06/28/2023  Name: ASMARA BACKS MRN: [id number] DOB: Sep 29, 1965  Today's TOC FU Call Status: Today's TOC FU Call Status:: Successful TOC FU Call Completed TOC FU Call Complete Date: 06/28/23 Patient's Name and Date of Birth confirmed.  Transition Care Management Follow-up Telephone Call Date of Discharge: 06/25/23 Discharge Facility: Other ([facility]) Name of Other ([facility]) Discharge Facility: [facility] Type of Discharge: Inpatient Admission Primary Inpatient Discharge Diagnosis:: Pulmonary Hypertension How have you been since you were released from the hospital?: Better Any questions or concerns?: Yes Patient Questions/Concerns:: Patient's question regarding taking bandaid off of site where Hickman Catheter was removed - okay to remove after 48 hours, cleanse area gently with soap & water - patient states she still has antibacterial Hibiclens and prefers to use that  Items Reviewed: Did you receive and understand the discharge instructions provided?: Yes Medications obtained,verified, and reconciled?: Yes (Medications Reviewed) Any new allergies since your discharge?: No Dietary orders reviewed?: Yes Type of Diet Ordered:: Low Sodium, Heart Healthy Do you have support at home?: Yes People in Home: alone Name of Support/Comfort Primary Source: Has support if needed, adult daughter 21  Medications Reviewed Today: Medications Reviewed Today     Reviewed by Marcos Eke, RN (Registered Nurse) on 06/28/23 at 1036  Med List Status: <None>   Medication Order Taking? Sig Documenting Provider Last Dose Status Informant  acetaminophen (TYLENOL) 500 MG tablet [id number] No Take 1,000 mg by mouth every 6 (six) hours as needed for moderate pain or headache.  [provider] Taking Active Self  albuterol (ACCUNEB) 1.25 MG/3ML nebulizer solution [id number] No Take 3 mLs (1.25 mg total) by nebulization every 6 (six) hours as needed for wheezing. Arnette Felts, FNP Taking Active   albuterol [facility] HFA) [phone] Base) MCG/ACT inhaler [id number] No Inhale 1-2 puffs into the lungs every 6 (six) hours as needed for wheezing or shortness of breath. McLean-Scocuzza, Pasty Spillers, MD Taking Active   allopurinol (ZYLOPRIM) 300 MG tablet [id number] No TAKE 1 TABLET BY MOUTH EVERY DAY NEED APPT McLean-Scocuzza, Pasty Spillers, MD Taking Active            Med Note Antony Madura, Melody Comas Jan 13, 2023  3:18 PM)    anastrozole (ARIMIDEX) 1 MG tablet [id number] No TAKE 1 TABLET BY MOUTH EVERY DAY Serena Croissant, MD Taking Active   atorvastatin (LIPITOR) 20 MG tablet [id number] No Take 1 tablet (20 mg total) by mouth daily. Arnette Felts, FNP Taking Active   Azelastine HCl 137 MCG/SPRAY SOLN [id number] No PLACE 1 SPRAY INTO BOTH NOSTRILS 2 (TWO) TIMES DAILY. USE IN EACH NOSTRIL AS DIRECTED Eulis Foster, FNP Taking Active   chlorpheniramine-HYDROcodone (TUSSIONEX) 10-8 MG/5ML [id number] No Take 5 mLs by mouth every 12 (twelve) hours as needed for cough. Arnette Felts, FNP Taking Active   clotrimazole-betamethasone Thurmond Butts) cream [id number] No Apply 1 application topically 2 (two) times daily. Prn leg dermatitis McLean-Scocuzza, Pasty Spillers, MD Taking Active   Colchicine (MITIGARE) 0.6 MG CAPS [id number] No Take 1 capsule by mouth as needed. As needed gout flare max # of pills 2 in 24 hours McLean-Scocuzza, Pasty Spillers, MD Taking Active   fluticasone (FLONASE) 50 MCG/ACT nasal spray [id number] No Place into both nostrils daily as needed for allergies or rhinitis. [provider] Taking Active Self  Fluticasone-Salmeterol (ADVAIR HFA IN) [id number] No Inhale 1 puff into the lungs as needed. [provider] Taking Active   ibuprofen (ADVIL) 800 MG tablet [id number] No  Take 1 tablet (800 mg total) by mouth every 8 (eight) hours as needed. McLean-Scocuzza, Pasty Spillers, MD Taking Active   loratadine (CLARITIN) 10 MG tablet [id number] No Take 1 tablet (10 mg total) by mouth daily.  McLean-Scocuzza, Pasty Spillers, MD Taking Active   losartan (COZAAR) 25 MG tablet [id number] No Take 1 tablet (25 mg total) by mouth daily. Bensimhon, Bevelyn Buckles, MD Taking Active   Magnesium Cl-Calcium Carbonate (SLOW MAGNESIUM/CALCIUM) 70-117 MG TBEC [id number] No Take 2 tablets by mouth in the morning and at bedtime. Serena Croissant, MD Taking Active Self  metoprolol tartrate (LOPRESSOR) 25 MG tablet [id number] No TAKE 1 TABLET BY MOUTH TWICE A DAY McLean-Scocuzza, Pasty Spillers, MD Taking Active   Multiple Vitamin (MULTIVITAMIN) tablet [id number] No Take 1 tablet by mouth daily. [provider] Taking Active Self  omeprazole (PRILOSEC) 40 MG capsule [id number] No Take 40 mg by mouth as needed. [provider] Taking Active   OPSUMIT 10 MG TABS [id number] No Take 10 mg by mouth daily. [provider] Taking Active Self  ORENITRAM 1 MG TBCR [id number] No Take by mouth. [provider] Taking Active   ORENITRAM 5 MG TBCR [id number] No Take by mouth. [provider] Taking Active   Polyethyl Glycol-Propyl Glycol (SYSTANE OP) [id number] No Place 1 drop into both eyes 3 (three) times daily as needed (dry/irritated eyes.). [provider] Taking Active Self  potassium chloride SA (KLOR-CON) 20 MEQ tablet [id number] No Take 1 tablet (20 mEq total) by mouth daily. Serena Croissant, MD Taking Active Self    Discontinued 08/17/16 [phone]   rivaroxaban (XARELTO) 20 MG TABS tablet [id number] No Take 20 mg by mouth daily with supper. [provider] Taking Active Self  Semaglutide, 2 MG/DOSE, (OZEMPIC, 2 MG/DOSE,) 8 MG/3ML SOPN [id number]  Inject 2 mg into the skin once a week. Rayburn, Fanny Bien, PA-C  Active   sildenafil (REVATIO) 20 MG tablet [id number] No Take 1-2 tablets (20-40 mg total) by mouth 3 (three) times daily. As of 07/03/20 taking 20 mg tid per [facility] pulm Dr. Tresa Res McLean-Scocuzza, Pasty Spillers, MD Taking Active   spironolactone (ALDACTONE) 50 MG tablet [id number] No  Take 1 tablet (50 mg total) by mouth daily. McLean-Scocuzza, Pasty Spillers, MD Taking Active   torsemide (DEMADEX) 20 MG tablet [id number] No Take 20 mg by mouth daily as needed. [provider] Taking Active   treprostinil (REMODULIN) 20 MG/20ML injection [id number] No 20 ng/kg/min by Continuous infusion (non-IV) route continuous. [provider] Taking Active Self  triamcinolone cream (KENALOG) 0.1 % [id number] No Apply 1 application topically daily as needed (leg discoloration). McLean-Scocuzza, Pasty Spillers, MD Taking Active Self  Vitamin D, Ergocalciferol, (DRISDOL) 1.25 MG (50000 UNIT) CAPS capsule [id number]  Take 1 capsule (50,000 Units total) by mouth every 7 (seven) days. *Need office visit for refills* Rayburn, Fanny Bien, PA-C  Active   WINREVAIR 2 x 45 MG subcutaneous injection [id number] No Inject into the skin. [provider] Taking Active             Home Care and Equipment/Supplies: Were Home Health Services Ordered?: NA Any new equipment or medical supplies ordered?: NA  Functional Questionnaire: Do you need assistance with bathing/showering or dressing?: No Do you need assistance with meal preparation?: No Do you need assistance with eating?: No Do you have difficulty maintaining continence: No Do you need assistance with getting out of bed/getting out of a chair/moving?: No Do you have difficulty managing or taking your medications?: No  Follow up appointments reviewed: PCP Follow-up appointment confirmed?: Yes Date of PCP follow-up appointment?: 08/11/23 Follow-up Provider: PCP [facility] Follow-up appointment confirmed?: Yes Date of Specialist follow-up appointment?: 07/14/23 Follow-Up Specialty Provider:: Dr. Fanny Bien Rayburn Do you need transportation to your follow-up appointment?: No Do you understand care options if your condition(s) worsen?: Yes-patient verbalized understanding  Gailyn Crook A. Mliss Fritz RN,  BA, [facility], CRRN [place]  [facility] RN Care Manager, Transition of Care [phone]

## 2023-07-13 ENCOUNTER — Telehealth (INDEPENDENT_AMBULATORY_CARE_PROVIDER_SITE_OTHER): Payer: Self-pay

## 2023-07-13 NOTE — Progress Notes (Unsigned)
 SUBJECTIVE: Discussed the use of AI scribe software for clinical note transcription with the patient, who gave verbal consent to proceed.  Chief Complaint: Obesity  Interim History: She is up 3 lbs since last visit.   [name] is here to discuss her progress with her obesity treatment plan. She is on the Category 2 Plan and states she is following her eating plan approximately 80 % of the time. She states she is exercising 60 minutes 4 times per week.  The patient is a 58 year old with pulmonary artery hypertension and type 2 diabetes who presents for follow-up on her obesity treatment plan.  She is transitioning from intravenous to oral treprostinil for her pulmonary artery hypertension. The transition has been challenging, with severe headaches and diarrhea, which she attributes to the medication being administered too frequently. Initially prescribed every six hours, she believes every eight hours would be more suitable. She has been taking potassium supplements to manage potential low potassium levels due to diarrhea. She was recently admitted to [facility] hospitals from Monday to Friday for the transition of her medication. During this time, she was weaned off the IV medication and started on the oral form. The transition has been rough, but she started noticing some improvement yesterday.  She is currently on Ozempic, taking 2 mg weekly, and reports no issues with this medication. She has gained three pounds, which includes an increase in muscle mass and a slight increase in adipose tissue and water weight. Her BMI is currently 39. She has had to cut back on physical activities due to decreased oxygen levels and increased fatigue, often needing to rest after exercise. She continues to attend exercise classes but has reduced her activity level.  She experiences swelling, particularly after eating, and notes that one leg appears more swollen than the other. She also reports some puffiness in her  hands. She is managing her diet by consuming protein shakes and peanut butter and jelly sandwiches to ensure she consumes enough calories to prevent gastrointestinal discomfort from her medication. She also takes Imodium to manage diarrhea.  She plans to [place] the [place] with her grandchildren for Easter, which she anticipates will provide some exercise and a change of environment. OBJECTIVE: Visit Diagnoses: Problem List Items Addressed This Visit     Pulmonary HTN (HCC)   Type 2 diabetes mellitus with microalbuminuria, without long-term current use of insulin (HCC) - Primary   Relevant Medications   Semaglutide, 2 MG/DOSE, (OZEMPIC, 2 MG/DOSE,) 8 MG/3ML SOPN   Vitamin D deficiency   Obesity (HCC)-start bmi 40.10   Relevant Medications   Semaglutide, 2 MG/DOSE, (OZEMPIC, 2 MG/DOSE,) 8 MG/3ML SOPN   Other Visit Diagnoses       Hyperlipidemia associated with type 2 diabetes mellitus (HCC)       Relevant Medications   Semaglutide, 2 MG/DOSE, (OZEMPIC, 2 MG/DOSE,) 8 MG/3ML SOPN     BMI 39.0-39.9,adult Current BMI 39.3         Obesity BMI is 39 with a slight weight increase, including 2.2 pounds of muscle and 0.8 pounds of adipose tissue. Weight gain is not concerning due to ongoing medication transition and anticipated further weight loss with improved exercise capacity. Currently on Ozempic 2 mg weekly, which is well-tolerated. Potential switch back to [facility] if weight loss does not improve, pending stabilization of current medication transition. Future availability of a new medication with three drugs may offer greater weight loss benefits. - Continue Ozempic 2 mg weekly - Monitor weight  and BMI - Consider switching to Mounjaro 7.5 mg if weight loss does not improve after medication transition  Type 2 Diabetes Mellitus Managed with Ozempic. Recent hospital labs will be reviewed for A1c levels to determine if further testing is needed. Potential use of Mounjaro or Zepbound for  better weight loss and diabetes control was discussed, but decision is to wait until current medication transition stabilizes. Zepbound is chemically identical to Mounjaro and offers no additional benefits. - Review recent hospital labs for A1c levels - Continue current diabetes management with Ozempic  Pulmonary Artery Hypertension Recently transitioned from IV to oral treprostinil, experiencing severe side effects including headaches and diarrhea. Adjusted dosing interval to every eight hours, improving symptoms. Gradual titration necessary to minimize side effects. Scheduled to follow up with specialist on Friday for further management. - Continue oral treprostinil with adjusted dosing interval - Follow up with specialist on Friday for further management  Allergic Rhinitis Significant allergy symptoms due to high pollen levels, managed with allergy medication and wearing a mask outdoors. Current management strategy is supported. - Continue current allergy medication - Continue wearing a mask outdoors  General Health Maintenance Ensuring adequate protein intake, especially with current medication regimen. Taking vitamin D once a week with sufficient supply. Adequate protein intake emphasized to support energy needs and muscle maintenance, particularly given increased energy expenditure due to breathing difficulties. - Continue vitamin D supplementation once a week - Ensure adequate protein intake, including protein shakes if necessary  Follow-up Scheduled follow-up appointments to monitor progress and adjust treatment plan as needed. Metabolic rate will be reassessed, and medication regimen may be adjusted based on progress. - Follow up on May 21 at 2:30 PM - Schedule fasting visit on June 18 at 8:30 AM for metabolism test and potential lab work  Vitals Temp: 98.1 F (36.7 C) BP: 136/76 Pulse Rate: 91 SpO2: 94 %   Anthropometric Measurements Height: 5\' 5"  (1.651 m) Weight: 236 lb  (107 kg) BMI (Calculated): 39.27 Weight at Last Visit: 233lb Weight Lost Since Last Visit: 0lb Weight Gained Since Last Visit: 3lb Starting Weight: 241lb Total Weight Loss (lbs): 5 lb (2.268 kg) Peak Weight: 256lb   Body Composition  Body Fat %: 49.9 % Fat Mass (lbs): 118 lbs Muscle Mass (lbs): 112.4 lbs Total Body Water (lbs): 82.4 lbs Visceral Fat Rating : 15   Other Clinical Data Fasting: No Labs: no Today's Visit #: 25 Starting Date: 07/24/21     ASSESSMENT AND PLAN:  Diet: [name] is currently in the action stage of change. As such, her goal is to continue with weight loss efforts. She has agreed to Category 2 Plan.  Exercise: [name] has been instructed to work up to a goal of 150 minutes of combined cardio and strengthening exercise per week for weight loss and overall health benefits.   Behavior Modification:  We discussed the following Behavioral Modification Strategies today: increasing lean protein intake, decreasing simple carbohydrates, increasing vegetables, increase H2O intake, increase high fiber foods, meal planning and cooking strategies, avoiding temptations, and planning for success. We discussed various medication options to help [name] with her weight loss efforts and we both agreed to continue current plan for obesity management and continue to work on nutritional and behavioral strategies to promote weight loss.  .  No follow-ups on file.[name] She was informed of the importance of frequent follow up visits to maximize her success with intensive lifestyle modifications for her multiple health conditions.  Attestation Statements:   Reviewed by  clinician on day of visit: allergies, medications, problem list, medical history, surgical history, family history, social history, and previous encounter notes.   Time spent on visit including pre-visit chart review and post-visit care and charting was 26 minutes.    Tiffany Moree, PA-C

## 2023-07-13 NOTE — Telephone Encounter (Signed)
 See my chart message

## 2023-07-14 ENCOUNTER — Encounter (INDEPENDENT_AMBULATORY_CARE_PROVIDER_SITE_OTHER): Payer: Self-pay | Admitting: Physician Assistant

## 2023-07-14 ENCOUNTER — Ambulatory Visit (INDEPENDENT_AMBULATORY_CARE_PROVIDER_SITE_OTHER): Admitting: Physician Assistant

## 2023-07-14 ENCOUNTER — Other Ambulatory Visit (HOSPITAL_COMMUNITY): Payer: Self-pay

## 2023-07-14 VITALS — BP 136/76 | HR 91 | Temp 98.1°F | Ht 65.0 in | Wt 236.0 lb

## 2023-07-14 DIAGNOSIS — E669 Obesity, unspecified: Secondary | ICD-10-CM

## 2023-07-14 DIAGNOSIS — Z7985 Long-term (current) use of injectable non-insulin antidiabetic drugs: Secondary | ICD-10-CM

## 2023-07-14 DIAGNOSIS — R809 Proteinuria, unspecified: Secondary | ICD-10-CM

## 2023-07-14 DIAGNOSIS — E559 Vitamin D deficiency, unspecified: Secondary | ICD-10-CM

## 2023-07-14 DIAGNOSIS — Z6839 Body mass index (BMI) 39.0-39.9, adult: Secondary | ICD-10-CM

## 2023-07-14 DIAGNOSIS — E1169 Type 2 diabetes mellitus with other specified complication: Secondary | ICD-10-CM | POA: Diagnosis not present

## 2023-07-14 DIAGNOSIS — I272 Pulmonary hypertension, unspecified: Secondary | ICD-10-CM | POA: Diagnosis not present

## 2023-07-14 DIAGNOSIS — E785 Hyperlipidemia, unspecified: Secondary | ICD-10-CM

## 2023-07-14 DIAGNOSIS — E1129 Type 2 diabetes mellitus with other diabetic kidney complication: Secondary | ICD-10-CM | POA: Diagnosis not present

## 2023-07-14 MED ORDER — OZEMPIC (2 MG/DOSE) 8 MG/3ML ~~LOC~~ SOPN
2.0000 mg | PEN_INJECTOR | SUBCUTANEOUS | 1 refills | Status: DC
  Filled 2023-07-14: qty 3, 28d supply, fill #0
  Filled 2023-08-13: qty 3, 28d supply, fill #1

## 2023-07-22 ENCOUNTER — Other Ambulatory Visit (HOSPITAL_COMMUNITY): Payer: Self-pay

## 2023-08-10 NOTE — Progress Notes (Signed)
 [place], [name],acting as a Neurosurgeon for Tiffany Epley, FNP.,have documented all relevant documentation on the behalf of Tiffany Epley, FNP,as directed by  Tiffany Epley, FNP while in the presence of Tiffany Epley, FNP.  Subjective:  Patient ID: [name] , female    DOB: 04-Jun-1965 , 58 y.o.   MRN: [id number]  Chief Complaint  Patient presents with   Hypertension    Patient presents today for a bp and chol follow up, Patient reports compliance with medication. Patient denies any chest pain, SOB, or headaches. Patient has no concerns today.    Hyperlipidemia    HPI  She had an elevated A1c of 8.8 in 2017 after having IV prednisone  in the hospital. After that time it was not above 6.4. she is on Ozempic  2 mg. She has taken Mounjaro  and when she went up to 10 mg she was nauseated, went back down to 7.5 mg.   She was admitted to [facility] to wean her off of her triposinol. She is now on the oral medication. The transition has been essentially normal other than the side effects - she was having nausea, headaches, loose stool, she is having hemorrhoids. She will call her provider next week. She does report having a bloody stool with the diarrhea but it has since stopped. She is no longer carrying her IV.   She is interested in an oxygen  class. She is seeing Dr. Lorayne Rocks for her cardiopulmonary issues. She does wear her oxygen  off sometimes and her oxygen  level has been dropping a little. She may can go without for about 30 minutes. It will be down to 81-85. When she was on the IV medication it would drop to 88. She is on 3 l/m O2.       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   Menorrhagia    secondary to uterine fibroids   OSA (obstructive sleep apnea)    07/25/13 HST AHI 33/hr, severe hypoxemia O2 min 42% and 95% of the time <89%   PE (pulmonary embolism) 2014   bilateral   Prediabetes    Pulmonary artery hypertension (HCC)    Pulmonary nodule    (5mm on loeft lower lobe) found on CT scan July 2014, repeat scan Jan 2015 showed less than 4mm   Right ovarian cyst    noted 09/2012    S/P TAH (total abdominal hysterectomy) 06/07/2013   Sleep apnea    SOB (shortness of breath) on exertion    Stomach ulcer    Trichomoniasis    05/2011    Vitamin D  deficiency      Family History  Problem Relation Age of Onset   Hypertension Mother    Kidney disease Mother    Heart disease Mother    Alcoholism Mother    Hypertension Father    Heart disease Father    Stroke Sister    Hypertension Brother    Breast cancer Maternal Grandmother  died at 76   Breast cancer Paternal Grandmother    Cancer Maternal Aunt        unknown form   Breast cancer Paternal Aunt    Breast cancer Paternal Aunt    Breast cancer Cousin        pat first cousin dx in her 97s   Colon cancer Other 55       MGMs brother   Cancer Other        breast ca in [place]   Cancer Other        g uncle colon or stomach ca     Current Outpatient Medications:    acetaminophen  (TYLENOL ) 500 MG tablet, Take 1,000 mg by mouth every 6 (six) hours as needed for moderate pain or headache. , Disp: , Rfl:    albuterol  (ACCUNEB ) 1.25 MG/3ML nebulizer solution, Take 3 mLs (1.25 mg total) by nebulization every 6 (six) hours as needed for wheezing., Disp: 360 mL, Rfl: 12   albuterol  (PROAIR  HFA) 108 (90 Base) MCG/ACT inhaler, Inhale 1-2 puffs into the lungs every 6 (six) hours as needed for wheezing or shortness of breath., Disp: 18 g, Rfl: 11   allopurinol  (ZYLOPRIM ) 300 MG tablet, TAKE 1 TABLET BY  MOUTH EVERY DAY NEED APPT, Disp: 90 tablet, Rfl: 3   anastrozole  (ARIMIDEX ) 1 MG tablet, TAKE 1 TABLET BY MOUTH EVERY DAY, Disp: 90 tablet, Rfl: 3   atorvastatin  (LIPITOR) 20 MG tablet, Take 1 tablet (20 mg total) by mouth daily., Disp: 30 tablet, Rfl: 2   Azelastine  HCl 137 MCG/SPRAY SOLN, PLACE 1 SPRAY INTO BOTH NOSTRILS 2 (TWO) TIMES DAILY. USE IN EACH NOSTRIL AS DIRECTED, Disp: 30 mL, Rfl: 4   chlorpheniramine-HYDROcodone  (TUSSIONEX) 10-8 MG/5ML, Take 5 mLs by mouth every 12 (twelve) hours as needed for cough., Disp: 115 mL, Rfl: 0   clotrimazole -betamethasone  (LOTRISONE ) cream, Apply 1 application topically 2 (two) times daily. Prn leg dermatitis, Disp: 45 g, Rfl: 0   Colchicine  (MITIGARE ) 0.6 MG CAPS, Take 1 capsule by mouth as needed. As needed gout flare max # of pills 2 in 24 hours, Disp: 30 capsule, Rfl: 11   fluticasone  (FLONASE ) 50 MCG/ACT nasal spray, Place into both nostrils daily as needed for allergies or rhinitis., Disp: , Rfl:    Fluticasone -Salmeterol (ADVAIR  HFA IN), Inhale 1 puff into the lungs as needed., Disp: , Rfl:    ibuprofen  (ADVIL ) 800 MG tablet, Take 1 tablet (800 mg total) by mouth every 8 (eight) hours as needed., Disp: 90 tablet, Rfl: 1   loratadine  (CLARITIN ) 10 MG tablet, Take 1 tablet (10 mg total) by mouth daily., Disp: 90 tablet, Rfl: 3   losartan  (COZAAR ) 25 MG tablet, Take 1 tablet (25 mg total) by mouth daily., Disp: 90 tablet, Rfl: 3   Magnesium  Cl-Calcium  Carbonate (SLOW MAGNESIUM /CALCIUM ) 70-117 MG TBEC, Take 2 tablets by mouth in the morning and at bedtime., Disp: , Rfl:    metoprolol  tartrate (LOPRESSOR ) 25 MG tablet, TAKE 1 TABLET BY MOUTH TWICE A DAY, Disp: 180 tablet, Rfl: 3   Multiple Vitamin (MULTIVITAMIN) tablet, Take 1 tablet by mouth daily., Disp: , Rfl:    omeprazole  (PRILOSEC) 40 MG capsule, Take 40 mg by mouth as needed., Disp: , Rfl:    OPSUMIT  10 MG TABS, Take 10 mg by mouth daily., Disp: , Rfl: 8   ORENITRAM  1 MG TBCR, Take by mouth., Disp:  , Rfl:    ORENITRAM  5 MG TBCR, Take by mouth., Disp: , Rfl:  Polyethyl Glycol-Propyl Glycol (SYSTANE OP), Place 1 drop into both eyes 3 (three) times daily as needed (dry/irritated eyes.)., Disp: , Rfl:    potassium chloride  SA (KLOR-CON ) 20 MEQ tablet, Take 1 tablet (20 mEq total) by mouth daily., Disp: , Rfl:    rivaroxaban  (XARELTO ) 20 MG TABS tablet, Take 20 mg by mouth daily with supper., Disp: , Rfl:    sildenafil  (REVATIO ) 20 MG tablet, Take 1-2 tablets (20-40 mg total) by mouth 3 (three) times daily. As of 07/03/20 taking 20 mg tid per [facility] pulm Dr. Tura Gaines, Disp: 10 tablet, Rfl: 0   spironolactone  (ALDACTONE ) 50 MG tablet, Take 1 tablet (50 mg total) by mouth daily., Disp: 90 tablet, Rfl: 3   torsemide  (DEMADEX ) 20 MG tablet, Take 20 mg by mouth daily as needed., Disp: , Rfl:    treprostinil  (REMODULIN ) 20 MG/20ML injection, 20 ng/kg/min by Continuous infusion (non-IV) route continuous., Disp: , Rfl:    triamcinolone  cream (KENALOG ) 0.1 %, Apply 1 application topically daily as needed (leg discoloration)., Disp: 454 g, Rfl: 2   Vitamin D , Ergocalciferol , (DRISDOL ) 1.25 MG (50000 UNIT) CAPS capsule, Take 1 capsule (50,000 Units total) by mouth every 7 (seven) days. *Need office visit for refills*, Disp: 5 capsule, Rfl: 0   WINREVAIR 2 x 45 MG subcutaneous injection, Inject into the skin., Disp: , Rfl:    linaclotide (LINZESS) 72 MCG capsule, Take 1 capsule (72 mcg total) by mouth daily before breakfast., Disp: 90 capsule, Rfl: 1   Semaglutide , 2 MG/DOSE, (OZEMPIC , 2 MG/DOSE,) 8 MG/3ML SOPN, Inject 2 mg into the skin once a week., Disp: 3 mL, Rfl: 1 No current facility-administered medications for this visit.  Facility-Administered Medications Ordered in Other Visits:    heparin  lock flush 100 unit/mL, 500 Units, Intracatheter, Once PRN, Gudena, Vinay, MD   sodium chloride  flush (NS) 0.9 % injection 10 mL, 10 mL, Intracatheter, PRN, Gudena, Vinay, MD   Allergies  Allergen Reactions    Ampicillin Other (See Comments)    Severe abdominal pain, dizziness (penicillin is okay)   Amoxicillin Other (See Comments)    LIGHT-HEADED and CLOSE TO "PASSING OUT"  AND ABDOMINAL PAIN Has patient had a PCN reaction causing immediate rash, facial/tongue/throat swelling, SOB or lightheadedness with hypotension: No Has patient had a PCN reaction causing severe rash involving mucus membranes or skin necrosis: No Has patient had a PCN reaction that required hospitalization No Has patient had a PCN reaction occurring within the last 10 years: No If all of the above answers are "NO", then may proceed with Cephalosporin use.   Bismuth -Containing Compounds    Indomethacin  Other (See Comments)    Gastro irritation   Nsaids     GI ulcers   Penicillins    Sulfasalazine    Metronidazole  Nausea And Vomiting   Sulfa Antibiotics Other (See Comments) and Rash    Very bad yeast infection     Review of Systems  Constitutional: Negative.   HENT:  Negative for congestion.   Respiratory: Negative.    Cardiovascular: Negative.   Gastrointestinal:  Positive for diarrhea.  Skin: Negative.   Neurological: Negative.   Psychiatric/Behavioral: Negative.       Today's Vitals   08/11/23 1118  BP: 110/80  Pulse: 74  Temp: 98.6 F (37 C)  TempSrc: Oral  Weight: 243 lb 9.6 oz (110.5 kg)  Height: 5\' 5"  (1.651 m)  PainSc: 0-No pain   Body mass index is 40.54 kg/m.  Wt Readings from Last 3 Encounters:  08/19/23 239 lb (108.4 kg)  08/18/23 239 lb (108.4 kg)  08/11/23 243 lb 9.6 oz (110.5 kg)     Objective:  Physical Exam Vitals reviewed.  Constitutional:      General: She is not in acute distress.    Appearance: Normal appearance. She is well-developed. She is obese.  HENT:     Head: Normocephalic and atraumatic.  Eyes:     Pupils: Pupils are equal, round, and reactive to light.  Cardiovascular:     Rate and Rhythm: Normal rate and regular rhythm.     Pulses: Normal pulses.     Heart  sounds: Normal heart sounds. No murmur heard. Pulmonary:     Effort: Pulmonary effort is normal. No respiratory distress.     Breath sounds: Normal breath sounds. No wheezing.     Comments: Wearing Oxygen  via [place] Musculoskeletal:        General: Normal range of motion.  Skin:    General: Skin is warm and dry.     Capillary Refill: Capillary refill takes less than 2 seconds.  Neurological:     General: No focal deficit present.     Mental Status: She is alert and oriented to person, place, and time.     Cranial Nerves: No cranial nerve deficit.     Motor: No weakness.  Psychiatric:        Mood and Affect: Mood normal.        Behavior: Behavior normal.        Thought Content: Thought content normal.        Judgment: Judgment normal.         Assessment And Plan:  Mixed hyperlipidemia Assessment & Plan: Cholesterol levels are stable. Continue statin and focusing on low fat diet.  Orders: -     Lipid panel  Portal hypertension (HCC) Assessment & Plan: Continue f/u with Pulmonology. She would like to be able to stop using her oxygen . She has started taking an oral medication vs IV (weaning down) with her doctor at [facility]. Advised her they would need to be the one to determine if she no longer needs her oxygen .    Type 2 diabetes mellitus with microalbuminuria, without long-term current use of insulin  (HCC) Assessment & Plan: HgbA1c has been stable. She has had an elevated A1c several years ago. Continue Ozempic  given at [facility]  Orders: -     Hemoglobin A1c -     Microalbumin / creatinine urine ratio    Return for 6 month bp check.  Patient was given opportunity to ask questions. Patient verbalized understanding of the plan and was able to repeat key elements of the plan. All questions were answered to their satisfaction.    Inge Mangle, FNP, have reviewed all documentation for this visit. The documentation on 08/11/23 for the exam, diagnosis,  procedures, and orders are all accurate and complete.   IF YOU HAVE BEEN REFERRED TO A SPECIALIST, IT MAY TAKE 1-2 WEEKS TO SCHEDULE/PROCESS THE REFERRAL. IF YOU HAVE NOT HEARD FROM US /SPECIALIST IN TWO WEEKS, PLEASE GIVE US  A CALL AT [phone].

## 2023-08-11 ENCOUNTER — Ambulatory Visit (INDEPENDENT_AMBULATORY_CARE_PROVIDER_SITE_OTHER): Payer: 59 | Admitting: Nurse Practitioner

## 2023-08-11 ENCOUNTER — Encounter: Payer: Self-pay | Admitting: Nurse Practitioner

## 2023-08-11 VITALS — BP 110/80 | HR 74 | Temp 98.6°F | Ht 65.0 in | Wt 243.6 lb

## 2023-08-11 DIAGNOSIS — K766 Portal hypertension: Secondary | ICD-10-CM

## 2023-08-11 DIAGNOSIS — R7309 Other abnormal glucose: Secondary | ICD-10-CM

## 2023-08-11 DIAGNOSIS — E782 Mixed hyperlipidemia: Secondary | ICD-10-CM

## 2023-08-11 DIAGNOSIS — R809 Proteinuria, unspecified: Secondary | ICD-10-CM

## 2023-08-11 DIAGNOSIS — E1129 Type 2 diabetes mellitus with other diabetic kidney complication: Secondary | ICD-10-CM

## 2023-08-12 LAB — MICROALBUMIN / CREATININE URINE RATIO
Creatinine, Urine: 112.3 mg/dL
Microalb/Creat Ratio: 948 mg/g{creat} — ABNORMAL HIGH (ref 0–29)
Microalbumin, Urine: 1064.3 ug/mL

## 2023-08-17 NOTE — Progress Notes (Unsigned)
 SUBJECTIVE: Discussed the use of AI scribe software for clinical note transcription with the patient, who gave verbal consent to proceed.  Chief Complaint: Obesity  Interim History: She is up 3 lbs since her last visit.   [name] is here to discuss her progress with her obesity treatment plan. She is on the Category 2 Plan and states she is not following her eating plan approximately 0 % of the time. She states she is not exercising 0 minutes 0 times per week.  She has been struggling to get back on track since changing to oral medication from IV for pulmonary hypertension.   [name] is a 58 year old female with type two diabetes and pulmonary hypertension who presents for follow-up of her obesity treatment plan.  Recently, her treatment for pulmonary hypertension was switched from intravenous to oral medications, and she is doing well with this change. She has been using Ozempic  for her type two diabetes management and previously felt she experienced better weight loss and tolerance with Mounjaro , but she is not currently requesting a switch. Her current medication regimen requires her to consume approximately 300 calories with her oral medication, which she finds challenging.  She has difficulty maintaining her exercise routine due to recent family responsibilities, including caring for her father who had a stroke and assisting her daughter post-hysterectomy. She has not been able to exercise for the past three weeks, impacting her weight management.  She describes her breathing as uncomfortable, particularly with seasonal changes, but does not attribute this to her medication. She also experiences constipation, with a sensation of fullness and difficulty with bowel movements, despite trying various laxatives and stool softeners.  Her current diet includes a variety of foods, and she tries to eat at home as much as possible. She mentions eating out occasionally but attempts to make  healthier choices when doing so. She is following a category two diet plan, aiming for 1200-1300 calories and 85 grams of protein daily.  Her sleep has been inconsistent, with early awakenings and difficulty maintaining a regular sleep schedule. She tries to avoid naps and focuses on resting when possible. OBJECTIVE: Visit Diagnoses: Problem List Items Addressed This Visit     Pulmonary HTN (HCC)   Type 2 diabetes mellitus with microalbuminuria, without long-term current use of insulin  (HCC) - Primary   Relevant Medications   Semaglutide , 2 MG/DOSE, (OZEMPIC , 2 MG/DOSE,) 8 MG/3ML SOPN   Vitamin D  deficiency   Obesity (HCC)-start bmi 40.10   Relevant Medications   Semaglutide , 2 MG/DOSE, (OZEMPIC , 2 MG/DOSE,) 8 MG/3ML SOPN   Other Visit Diagnoses       Hyperlipidemia associated with type 2 diabetes mellitus (HCC)       Relevant Medications   Semaglutide , 2 MG/DOSE, (OZEMPIC , 2 MG/DOSE,) 8 MG/3ML SOPN      Obesity Obesity management is ongoing with Ozempic , which jpatient feels is better tolerated and effective compared to Mounjaro . She faces challenges in adhering to her weight management plan due to personal stressors and lack of exercise. - Continue Ozempic  for weight management and type 2 diabetes - Encourage adherence to dietary plan, focusing on category two diet with 1200-1300 calories and 85 grams of protein per day - Prioritize resumption of regular exercise - Discuss strategies to manage stress and improve adherence to weight management plan  Type 2 diabetes mellitus Type 2 diabetes is managed with Ozempic . Patient would like to remain on Ozempic  instead of changing back to Mounjaro  at this time.  Patient reports her oral medications for pulmonary hypertension necessitate at least a 300-calorie intake to prevent stomach discomfort, complicating dietary management. Lab Results  Component Value Date   HGBA1C 5.1 04/13/2023   HGBA1C 5.2 11/12/2022   HGBA1C 5.1 09/09/2022    Lab Results  Component Value Date   MICROALBUR 6.4 07/03/2021   LDLCALC 142 (H) 04/13/2023   CREATININE 0.79 04/13/2023   Plan:  Continue Ozempic  for type 2 diabetes management - Ensure adequate caloric intake with medication to prevent stomach discomfort - She is working  on nutrition plan to decrease simple carbohydrates, increase lean proteins and exercise to promote weight loss and improve glycemic control .; Meds ordered this encounter  Medications   Semaglutide , 2 MG/DOSE, (OZEMPIC , 2 MG/DOSE,) 8 MG/3ML SOPN    Sig: Inject 2 mg into the skin once a week.    Dispense:  3 mL    Refill:  1    Constipation Constipation is likely exacerbated by Ozempic , which slows gastrointestinal motility. Laxatives and stool softeners have been ineffective. MiraLAX  is suggested to regulate bowel movements. - Recommend daily use of MiraLAX , starting with a full capful daily, to regulate bowel movements - Encourage increased intake of fruits, vegetables, and fluids - Consider Linzess if constipation persists after trying MiraLAX   Pulmonary hypertension Pulmonary hypertension is managed with oral medications. Breathing discomfort is attributed to seasonal changes rather than medication. She is adjusting to the new oral regimen after switching from intravenous medications. - Continue current oral medication regimen for pulmonary hypertension - Monitor for any changes in breathing or other symptoms No data recorded  No data recorded  No data recorded  No data recorded    ASSESSMENT AND PLAN:  Diet: [name] is currently in the action stage of change. As such, her goal is to get back to weight loss efforts. She has agreed to Category 2 Plan.  Exercise: [name] has been instructed to try a geriatric exercise plan and work back into her usual exercise as able  for weight loss and overall health benefits.   Behavior Modification:  We discussed the following Behavioral Modification  Strategies today: increasing lean protein intake, decreasing simple carbohydrates, increasing vegetables, decreasing sodium intake, increase high fiber foods, no skipping meals, meal planning and cooking strategies, better snacking choices, avoiding temptations, and planning for success. We discussed various medication options to help [facility] with her weight loss efforts and we both agreed to continue current treatment plan and continue to work on nutritional and behavioral strategies to promote weight loss.  .  Follow up in 4 weeks. She was informed of the importance of frequent follow up visits to maximize her success with intensive lifestyle modifications for her multiple health conditions.  Attestation Statements:   Reviewed by clinician on day of visit: allergies, medications, problem list, medical history, surgical history, family history, social history, and previous encounter notes.   Time spent on visit including pre-visit chart review and post-visit care and charting was 25 minutes.    Klint Lezcano, PA-C

## 2023-08-18 ENCOUNTER — Other Ambulatory Visit (HOSPITAL_COMMUNITY): Payer: Self-pay

## 2023-08-18 ENCOUNTER — Encounter (INDEPENDENT_AMBULATORY_CARE_PROVIDER_SITE_OTHER): Payer: Self-pay | Admitting: Physician Assistant

## 2023-08-18 ENCOUNTER — Ambulatory Visit (INDEPENDENT_AMBULATORY_CARE_PROVIDER_SITE_OTHER): Admitting: Physician Assistant

## 2023-08-18 VITALS — BP 143/80 | HR 91 | Temp 98.1°F | Ht 65.0 in | Wt 239.0 lb

## 2023-08-18 DIAGNOSIS — R809 Proteinuria, unspecified: Secondary | ICD-10-CM

## 2023-08-18 DIAGNOSIS — I272 Pulmonary hypertension, unspecified: Secondary | ICD-10-CM | POA: Diagnosis not present

## 2023-08-18 DIAGNOSIS — E669 Obesity, unspecified: Secondary | ICD-10-CM

## 2023-08-18 DIAGNOSIS — E559 Vitamin D deficiency, unspecified: Secondary | ICD-10-CM

## 2023-08-18 DIAGNOSIS — Z6839 Body mass index (BMI) 39.0-39.9, adult: Secondary | ICD-10-CM

## 2023-08-18 DIAGNOSIS — E785 Hyperlipidemia, unspecified: Secondary | ICD-10-CM

## 2023-08-18 DIAGNOSIS — E1169 Type 2 diabetes mellitus with other specified complication: Secondary | ICD-10-CM

## 2023-08-18 DIAGNOSIS — E1129 Type 2 diabetes mellitus with other diabetic kidney complication: Secondary | ICD-10-CM | POA: Diagnosis not present

## 2023-08-18 DIAGNOSIS — Z7985 Long-term (current) use of injectable non-insulin antidiabetic drugs: Secondary | ICD-10-CM

## 2023-08-18 MED ORDER — OZEMPIC (2 MG/DOSE) 8 MG/3ML ~~LOC~~ SOPN
2.0000 mg | PEN_INJECTOR | SUBCUTANEOUS | 1 refills | Status: DC
  Filled 2023-08-18: qty 3, 28d supply, fill #0

## 2023-08-19 ENCOUNTER — Other Ambulatory Visit: Payer: Self-pay | Admitting: Nurse Practitioner

## 2023-08-19 ENCOUNTER — Encounter: Payer: Self-pay | Admitting: Nurse Practitioner

## 2023-08-19 ENCOUNTER — Other Ambulatory Visit (HOSPITAL_COMMUNITY): Payer: Self-pay

## 2023-08-19 ENCOUNTER — Ambulatory Visit (INDEPENDENT_AMBULATORY_CARE_PROVIDER_SITE_OTHER): Admitting: Nurse Practitioner

## 2023-08-19 ENCOUNTER — Ambulatory Visit: Payer: Self-pay | Admitting: Nurse Practitioner

## 2023-08-19 VITALS — BP 132/70 | HR 71 | Ht 65.0 in | Wt 239.0 lb

## 2023-08-19 DIAGNOSIS — K5903 Drug induced constipation: Secondary | ICD-10-CM

## 2023-08-19 DIAGNOSIS — E66812 Obesity, class 2: Secondary | ICD-10-CM

## 2023-08-19 DIAGNOSIS — E6609 Other obesity due to excess calories: Secondary | ICD-10-CM | POA: Diagnosis not present

## 2023-08-19 DIAGNOSIS — Z6839 Body mass index (BMI) 39.0-39.9, adult: Secondary | ICD-10-CM | POA: Diagnosis not present

## 2023-08-19 MED ORDER — LINACLOTIDE 72 MCG PO CAPS
72.0000 ug | ORAL_CAPSULE | Freq: Every day | ORAL | 1 refills | Status: AC
  Filled 2023-08-19 – 2023-09-02 (×2): qty 90, 90d supply, fill #0

## 2023-08-19 NOTE — Progress Notes (Signed)
 I,Jameka J Llittleton, [name],acting as a Neurosurgeon for [employer], FNP.,have documented all relevant documentation on the behalf of Susanna Epley, FNP,as directed by  Susanna Epley, FNP while in the presence of Susanna Epley, FNP.  Subjective:  Patient ID: [name] , female    DOB: 1965/06/07 , 58 y.o.   MRN: [id number]  Chief Complaint  Patient presents with   Constipation    HPI  Patient presents today for constipation for the last several months, she reports it as being watery and has brown balls. She is on ozempic  daily, she is not taking a stool softner every day she is taking every other day. She will take a laxative with some relief. She took her neighbors Linzess and had good relief.   Constipation This is a new problem. The current episode started more than 1 month ago. The problem has been gradually worsening since onset. The stool is described as pellet like and loose. The patient is not on a high fiber diet. She Does not exercise regularly. There has Been adequate water intake. Pertinent negatives include no back pain or diarrhea. She has tried laxatives and stool softeners for the symptoms. The treatment provided no relief. Her past medical history is significant for metabolic disease. There is no history of abdominal surgery.     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   Menorrhagia    secondary to uterine fibroids   OSA (obstructive sleep apnea)    07/25/13 HST AHI 33/hr, severe hypoxemia O2 min 42% and 95% of the time <89%   PE (pulmonary embolism) 2014    bilateral   Prediabetes    Pulmonary artery hypertension (HCC)    Pulmonary nodule    (5mm on loeft lower lobe) found on CT scan July 2014, repeat scan Jan 2015 showed less than 4mm   Right ovarian cyst    noted 09/2012    S/P TAH (total abdominal hysterectomy) 06/07/2013   Sleep apnea    SOB (shortness of breath) on exertion    Stomach ulcer    Trichomoniasis    05/2011    Vitamin D  deficiency      Family History  Problem Relation Age of Onset   Hypertension Mother    Kidney disease Mother    Heart disease Mother    Alcoholism Mother    Hypertension Father    Heart disease Father    Stroke Sister    Hypertension Brother    Breast cancer Maternal Grandmother        died at 58   Breast cancer Paternal Grandmother    Cancer Maternal Aunt        unknown form   Breast cancer Paternal Aunt    Breast cancer Paternal Aunt    Breast cancer Cousin        pat first cousin dx in her 39s   Colon cancer Other 3       MGMs brother   Cancer Other  breast ca in [place]   Cancer Other        g uncle colon or stomach ca     Current Outpatient Medications:    acetaminophen  (TYLENOL ) 500 MG tablet, Take 1,000 mg by mouth every 6 (six) hours as needed for moderate pain or headache. , Disp: , Rfl:    albuterol  (ACCUNEB ) 1.25 MG/3ML nebulizer solution, Take 3 mLs (1.25 mg total) by nebulization every 6 (six) hours as needed for wheezing., Disp: 360 mL, Rfl: 12   albuterol  (PROAIR  HFA) 108 (90 Base) MCG/ACT inhaler, Inhale 1-2 puffs into the lungs every 6 (six) hours as needed for wheezing or shortness of breath., Disp: 18 g, Rfl: 11   allopurinol  (ZYLOPRIM ) 300 MG tablet, TAKE 1 TABLET BY MOUTH EVERY DAY NEED APPT, Disp: 90 tablet, Rfl: 3   anastrozole  (ARIMIDEX ) 1 MG tablet, TAKE 1 TABLET BY MOUTH EVERY DAY, Disp: 90 tablet, Rfl: 3   atorvastatin  (LIPITOR) 20 MG tablet, Take 1 tablet (20 mg total) by mouth daily., Disp: 30 tablet, Rfl: 2   Azelastine  HCl 137 MCG/SPRAY SOLN, PLACE 1 SPRAY  INTO BOTH NOSTRILS 2 (TWO) TIMES DAILY. USE IN EACH NOSTRIL AS DIRECTED, Disp: 30 mL, Rfl: 4   chlorpheniramine-HYDROcodone  (TUSSIONEX) 10-8 MG/5ML, Take 5 mLs by mouth every 12 (twelve) hours as needed for cough., Disp: 115 mL, Rfl: 0   clotrimazole -betamethasone  (LOTRISONE ) cream, Apply 1 application topically 2 (two) times daily. Prn leg dermatitis, Disp: 45 g, Rfl: 0   Colchicine  (MITIGARE ) 0.6 MG CAPS, Take 1 capsule by mouth as needed. As needed gout flare max # of pills 2 in 24 hours, Disp: 30 capsule, Rfl: 11   fluticasone  (FLONASE ) 50 MCG/ACT nasal spray, Place into both nostrils daily as needed for allergies or rhinitis., Disp: , Rfl:    Fluticasone -Salmeterol (ADVAIR  HFA IN), Inhale 1 puff into the lungs as needed., Disp: , Rfl:    ibuprofen  (ADVIL ) 800 MG tablet, Take 1 tablet (800 mg total) by mouth every 8 (eight) hours as needed., Disp: 90 tablet, Rfl: 1   linaclotide (LINZESS) 72 MCG capsule, Take 1 capsule (72 mcg total) by mouth daily before breakfast., Disp: 90 capsule, Rfl: 1   loratadine  (CLARITIN ) 10 MG tablet, Take 1 tablet (10 mg total) by mouth daily., Disp: 90 tablet, Rfl: 3   losartan  (COZAAR ) 25 MG tablet, Take 1 tablet (25 mg total) by mouth daily., Disp: 90 tablet, Rfl: 3   Magnesium  Cl-Calcium  Carbonate (SLOW MAGNESIUM /CALCIUM ) 70-117 MG TBEC, Take 2 tablets by mouth in the morning and at bedtime., Disp: , Rfl:    metoprolol  tartrate (LOPRESSOR ) 25 MG tablet, TAKE 1 TABLET BY MOUTH TWICE A DAY, Disp: 180 tablet, Rfl: 3   Multiple Vitamin (MULTIVITAMIN) tablet, Take 1 tablet by mouth daily., Disp: , Rfl:    omeprazole  (PRILOSEC) 40 MG capsule, Take 40 mg by mouth as needed., Disp: , Rfl:    OPSUMIT  10 MG TABS, Take 10 mg by mouth daily., Disp: , Rfl: 8   ORENITRAM  1 MG TBCR, Take by mouth., Disp: , Rfl:    ORENITRAM  5 MG TBCR, Take by mouth., Disp: , Rfl:    Polyethyl Glycol-Propyl Glycol (SYSTANE OP), Place 1 drop into both eyes 3 (three) times daily as needed  (dry/irritated eyes.)., Disp: , Rfl:    potassium chloride  SA (KLOR-CON ) 20 MEQ tablet, Take 1 tablet (20 mEq total) by mouth daily., Disp: , Rfl:    rivaroxaban  (XARELTO ) 20 MG TABS tablet, Take 20 mg by mouth  daily with supper., Disp: , Rfl:    Semaglutide , 2 MG/DOSE, (OZEMPIC , 2 MG/DOSE,) 8 MG/3ML SOPN, Inject 2 mg into the skin once a week., Disp: 3 mL, Rfl: 1   sildenafil  (REVATIO ) 20 MG tablet, Take 1-2 tablets (20-40 mg total) by mouth 3 (three) times daily. As of 07/03/20 taking 20 mg tid per [facility] Dr. Tura Gaines, Disp: 10 tablet, Rfl: 0   spironolactone  (ALDACTONE ) 50 MG tablet, Take 1 tablet (50 mg total) by mouth daily., Disp: 90 tablet, Rfl: 3   torsemide  (DEMADEX ) 20 MG tablet, Take 20 mg by mouth daily as needed., Disp: , Rfl:    treprostinil  (REMODULIN ) 20 MG/20ML injection, 20 ng/kg/min by Continuous infusion (non-IV) route continuous., Disp: , Rfl:    triamcinolone  cream (KENALOG ) 0.1 %, Apply 1 application topically daily as needed (leg discoloration)., Disp: 454 g, Rfl: 2   Vitamin D , Ergocalciferol , (DRISDOL ) 1.25 MG (50000 UNIT) CAPS capsule, Take 1 capsule (50,000 Units total) by mouth every 7 (seven) days. *Need office visit for refills*, Disp: 5 capsule, Rfl: 0   WINREVAIR 2 x 45 MG subcutaneous injection, Inject into the skin., Disp: , Rfl:  No current facility-administered medications for this visit.  Facility-Administered Medications Ordered in Other Visits:    heparin  lock flush 100 unit/mL, 500 Units, Intracatheter, Once PRN, Gudena, Vinay, MD   sodium chloride  flush (NS) 0.9 % injection 10 mL, 10 mL, Intracatheter, PRN, Gudena, Vinay, MD   Allergies  Allergen Reactions   Ampicillin Other (See Comments)    Severe abdominal pain, dizziness (penicillin is okay)   Amoxicillin Other (See Comments)    LIGHT-HEADED and CLOSE TO "PASSING OUT"  AND ABDOMINAL PAIN Has patient had a PCN reaction causing immediate rash, facial/tongue/throat swelling, SOB or lightheadedness with  hypotension: No Has patient had a PCN reaction causing severe rash involving mucus membranes or skin necrosis: No Has patient had a PCN reaction that required hospitalization No Has patient had a PCN reaction occurring within the last 10 years: No If all of the above answers are "NO", then may proceed with Cephalosporin use.   Bismuth -Containing Compounds    Indomethacin  Other (See Comments)    Gastro irritation   Nsaids     GI ulcers   Penicillins    Sulfasalazine    Metronidazole  Nausea And Vomiting   Sulfa Antibiotics Other (See Comments) and Rash    Very bad yeast infection     Review of Systems  Constitutional: Negative.   Eyes: Negative.   Respiratory: Negative.    Cardiovascular: Negative.   Gastrointestinal:  Positive for constipation. Negative for diarrhea.  Musculoskeletal: Negative.  Negative for back pain.  Skin: Negative.   Neurological: Negative.   Psychiatric/Behavioral: Negative.       Today's Vitals   08/19/23 1234  BP: 132/70  Pulse: 71  TempSrc: Oral  Weight: 239 lb (108.4 kg)  Height: 5\' 5"  (1.651 m)   Body mass index is 39.77 kg/m.  Wt Readings from Last 3 Encounters:  08/19/23 239 lb (108.4 kg)  08/18/23 239 lb (108.4 kg)  08/11/23 243 lb 9.6 oz (110.5 kg)    The 10-year ASCVD risk score (Arnett DK, et al., 2019) is: 15.3%   Values used to calculate the score:     Age: 89 years     Sex: Female     Is Non-Hispanic African American: Yes     Diabetic: Yes     Tobacco smoker: No     Systolic Blood Pressure: 132  mmHg     Is BP treated: Yes     HDL Cholesterol: 56 mg/dL     Total Cholesterol: 221 mg/dL  Objective:  Physical Exam Vitals and nursing note reviewed.  Constitutional:      General: She is not in acute distress.    Appearance: Normal appearance. She is well-developed. She is obese.  Cardiovascular:     Rate and Rhythm: Normal rate and regular rhythm.     Pulses: Normal pulses.     Heart sounds: Normal heart sounds. No murmur  heard. Pulmonary:     Effort: Pulmonary effort is normal. No respiratory distress.     Breath sounds: Normal breath sounds. No wheezing.     Comments: Wearing Oxygen  via  Abdominal:     General: Abdomen is flat. Bowel sounds are normal. There is no distension.     Palpations: Abdomen is soft. There is no mass.     Tenderness: There is no abdominal tenderness.  Musculoskeletal:        General: Normal range of motion.  Skin:    General: Skin is warm and dry.     Capillary Refill: Capillary refill takes less than 2 seconds.  Neurological:     General: No focal deficit present.     Mental Status: She is alert and oriented to person, place, and time.     Cranial Nerves: No cranial nerve deficit.     Motor: No weakness.  Psychiatric:        Mood and Affect: Mood normal.        Behavior: Behavior normal.        Thought Content: Thought content normal.        Judgment: Judgment normal.         Assessment And Plan:  Drug-induced constipation Assessment & Plan: Likely related to her Ozempic , she is encouraged to increase water as tolerated and to take a stool softner daily. I will start her on Linzess. She needs to return to office in 4-6 weeks. I have advised her to not take others medications.    Class 2 obesity due to excess calories with body mass index (BMI) of 39.0 to 39.9 in adult, unspecified whether serious comorbidity present Assessment & Plan: Continue f/u with [facility].     Return if symptoms worsen or fail to improve, for 4-6 week f/u new medication.  Patient was given opportunity to ask questions. Patient verbalized understanding of the plan and was able to repeat key elements of the plan. All questions were answered to their satisfaction.    Inge Mangle, FNP, have reviewed all documentation for this visit. The documentation on 08/19/23 for the exam, diagnosis, procedures, and orders are all accurate and complete.   IF YOU HAVE BEEN REFERRED  TO A SPECIALIST, IT MAY TAKE 1-2 WEEKS TO SCHEDULE/PROCESS THE REFERRAL. IF YOU HAVE NOT HEARD FROM US /SPECIALIST IN TWO WEEKS, PLEASE GIVE US  A CALL AT [phone].

## 2023-08-19 NOTE — Assessment & Plan Note (Addendum)
 Likely related to her Ozempic , she is encouraged to increase water as tolerated and to take a stool softner daily. I will start her on Linzess. She needs to return to office in 4-6 weeks. I have advised her to not take others medications.

## 2023-08-19 NOTE — Assessment & Plan Note (Signed)
 Continue f/u with [facility].

## 2023-08-22 NOTE — Assessment & Plan Note (Signed)
 Cholesterol levels are stable. Continue statin and focusing on low fat diet.

## 2023-08-22 NOTE — Assessment & Plan Note (Signed)
 Continue f/u with Pulmonology. She would like to be able to stop using her oxygen . She has started taking an oral medication vs IV (weaning down) with her doctor at [facility]. Advised her they would need to be the one to determine if she no longer needs her oxygen .

## 2023-08-22 NOTE — Assessment & Plan Note (Signed)
 HgbA1c has been stable. She has had an elevated A1c several years ago. Continue Ozempic  given at [facility]

## 2023-08-30 ENCOUNTER — Other Ambulatory Visit (HOSPITAL_COMMUNITY): Payer: Self-pay

## 2023-09-02 ENCOUNTER — Other Ambulatory Visit (HOSPITAL_COMMUNITY): Payer: Self-pay

## 2023-09-08 ENCOUNTER — Ambulatory Visit

## 2023-09-08 DIAGNOSIS — Z0000 Encounter for general adult medical examination without abnormal findings: Secondary | ICD-10-CM

## 2023-09-08 NOTE — Progress Notes (Signed)
 Subjective:   [name] is a 58 y.o. who presents for a Medicare Wellness preventive visit.  As a reminder, Annual Wellness Visits don't include a physical exam, and some assessments may be limited, especially if this visit is performed virtually. We may recommend an in-person follow-up visit with your provider if needed.  Visit Complete: Virtual I connected with  [name] on 09/08/23 by a video and audio enabled telemedicine application and verified that I am speaking with the correct person using two identifiers.  Patient Location: Home  Provider Location: Office/Clinic  I discussed the limitations of evaluation and management by telemedicine. The patient expressed understanding and agreed to proceed.  Vital Signs: Because this visit was a virtual/telehealth visit, some criteria may be missing or patient reported. Any vitals not documented were not able to be obtained and vitals that have been documented are patient reported.    Persons Participating in Visit: Patient.  AWV Questionnaire: No: Patient Medicare AWV questionnaire was not completed prior to this visit.  Cardiac Risk Factors include: advanced age (>58men, >17 women);dyslipidemia;hypertension     Objective:     Today's Vitals   There is no height or weight on file to calculate BMI.     09/08/2023   11:45 AM 01/13/2023    1:29 PM 10/26/2022   11:21 AM 09/09/2022   12:14 PM 10/23/2021   11:26 AM 12/31/2020    9:36 AM 09/09/2017   11:41 AM  Advanced Directives  Does Patient Have a Medical Advance Directive? No No No No No No No  Would patient like information on creating a medical advance directive? No - Patient declined  No - Patient declined Yes (MAU/Ambulatory/Procedural Areas - Information given)  No - Patient declined No - Patient declined  Pre-existing out of facility DNR order (yellow form or pink MOST form)   Pink MOST/Yellow Form most recent copy in chart - Physician notified to receive  inpatient order  Pink MOST/Yellow Form most recent copy in chart - Physician notified to receive inpatient order      Current Medications (verified) Outpatient Encounter Medications as of 09/08/2023  Medication Sig   acetaminophen  (TYLENOL ) 500 MG tablet Take 1,000 mg by mouth every 6 (six) hours as needed for moderate pain or headache.    albuterol  (ACCUNEB ) 1.25 MG/3ML nebulizer solution Take 3 mLs (1.25 mg total) by nebulization every 6 (six) hours as needed for wheezing.   albuterol  (PROAIR  HFA) 108 (90 Base) MCG/ACT inhaler Inhale 1-2 puffs into the lungs every 6 (six) hours as needed for wheezing or shortness of breath.   Azelastine  HCl 137 MCG/SPRAY SOLN PLACE 1 SPRAY INTO BOTH NOSTRILS 2 (TWO) TIMES DAILY. USE IN EACH NOSTRIL AS DIRECTED   chlorpheniramine-HYDROcodone  (TUSSIONEX) 10-8 MG/5ML Take 5 mLs by mouth every 12 (twelve) hours as needed for cough.   clotrimazole -betamethasone  (LOTRISONE ) cream Apply 1 application topically 2 (two) times daily. Prn leg dermatitis   Colchicine  (MITIGARE ) 0.6 MG CAPS Take 1 capsule by mouth as needed. As needed gout flare max # of pills 2 in 24 hours   fluticasone  (FLONASE ) 50 MCG/ACT nasal spray Place into both nostrils daily as needed for allergies or rhinitis.   Fluticasone -Salmeterol (ADVAIR  HFA IN) Inhale 1 puff into the lungs as needed.   ibuprofen  (ADVIL ) 800 MG tablet Take 1 tablet (800 mg total) by mouth every 8 (eight) hours as needed.   linaclotide  (LINZESS ) 72 MCG capsule Take 1 capsule (72 mcg total) by mouth daily before  breakfast.   loratadine  (CLARITIN ) 10 MG tablet Take 1 tablet (10 mg total) by mouth daily.   losartan  (COZAAR ) 25 MG tablet Take 1 tablet (25 mg total) by mouth daily.   Magnesium  Cl-Calcium  Carbonate (SLOW MAGNESIUM /CALCIUM ) 70-117 MG TBEC Take 2 tablets by mouth in the morning and at bedtime.   metoprolol  tartrate (LOPRESSOR ) 25 MG tablet TAKE 1 TABLET BY MOUTH TWICE A DAY   Multiple Vitamin (MULTIVITAMIN) tablet  Take 1 tablet by mouth daily.   omeprazole  (PRILOSEC) 40 MG capsule Take 40 mg by mouth as needed.   OPSUMIT  10 MG TABS Take 10 mg by mouth daily.   ORENITRAM  1 MG TBCR Take by mouth.   ORENITRAM  5 MG TBCR Take by mouth.   Polyethyl Glycol-Propyl Glycol (SYSTANE OP) Place 1 drop into both eyes 3 (three) times daily as needed (dry/irritated eyes.).   potassium chloride  SA (KLOR-CON ) 20 MEQ tablet Take 1 tablet (20 mEq total) by mouth daily.   rivaroxaban  (XARELTO ) 20 MG TABS tablet Take 20 mg by mouth daily with supper.   Semaglutide , 2 MG/DOSE, (OZEMPIC , 2 MG/DOSE,) 8 MG/3ML SOPN Inject 2 mg into the skin once a week.   sildenafil  (REVATIO ) 20 MG tablet Take 1-2 tablets (20-40 mg total) by mouth 3 (three) times daily. As of 07/03/20 taking 20 mg tid per [facility] pulm Dr. LEvarge   torsemide  (DEMADEX ) 20 MG tablet Take 20 mg by mouth daily as needed.   triamcinolone  cream (KENALOG ) 0.1 % Apply 1 application topically daily as needed (leg discoloration).   Vitamin D , Ergocalciferol , (DRISDOL ) 1.25 MG (50000 UNIT) CAPS capsule Take 1 capsule (50,000 Units total) by mouth every 7 (seven) days. *Need office visit for refills*   WINREVAIR 2 x 45 MG subcutaneous injection Inject into the skin.   allopurinol  (ZYLOPRIM ) 300 MG tablet TAKE 1 TABLET BY MOUTH EVERY DAY NEED APPT (Patient not taking: Reported on 09/08/2023)   anastrozole  (ARIMIDEX ) 1 MG tablet TAKE 1 TABLET BY MOUTH EVERY DAY (Patient not taking: Reported on 09/08/2023)   atorvastatin  (LIPITOR) 20 MG tablet Take 1 tablet (20 mg total) by mouth daily. (Patient not taking: Reported on 09/08/2023)   spironolactone  (ALDACTONE ) 50 MG tablet Take 1 tablet (50 mg total) by mouth daily. (Patient not taking: Reported on 09/08/2023)   treprostinil  (REMODULIN ) 20 MG/20ML injection 20 ng/kg/min by Continuous infusion (non-IV) route continuous. (Patient not taking: Reported on 09/08/2023)   [DISCONTINUED] prochlorperazine  (COMPAZINE ) 10 MG tablet Take 1 tablet (10 mg  total) by mouth every 6 (six) hours as needed (Nausea or vomiting).   Facility-Administered Encounter Medications as of 09/08/2023  Medication   heparin  lock flush 100 unit/mL   sodium chloride  flush (NS) 0.9 % injection 10 mL    Allergies (verified) Ampicillin, Amoxicillin, Bismuth -containing compounds, Indomethacin , Nsaids, Penicillins, Sulfasalazine, Metronidazole , and Sulfa antibiotics   History: 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   Menorrhagia    secondary to uterine fibroids   OSA (obstructive sleep apnea)  07/25/13 HST AHI 33/hr, severe hypoxemia O2 min 42% and 95% of the time <89%   PE (pulmonary embolism) 2014   bilateral   Prediabetes    Pulmonary artery hypertension (HCC)    Pulmonary nodule    (5mm on loeft lower lobe) found on CT scan July 2014, repeat scan Jan 2015 showed less than 4mm   Right ovarian cyst    noted 09/2012    S/P TAH (total abdominal hysterectomy) 06/07/2013   Sleep apnea    SOB (shortness of breath) on exertion    Stomach ulcer    Trichomoniasis    05/2011    Vitamin D  deficiency    Past Surgical History:  Procedure Laterality Date   ABDOMINAL HYSTERECTOMY N/A 06/07/2013   Procedure: HYSTERECTOMY ABDOMINAL WITH BIALTERAL SALPINGECTOMY;  Surgeon: Shasta Deist, MD;  Location: [facility];  Service: Gynecology;  Laterality: N/A;   BIOPSY  09/02/2017   Procedure: BIOPSY;  Surgeon: Baldo Bonds, MD;  Location: [facility];  Service: Endoscopy;;   BIOPSY  12/31/2020   Procedure: BIOPSY;  Surgeon: Baldo Bonds, MD;  Location: [facility];  Service: Endoscopy;;   BREAST LUMPECTOMY Left 07/27/2016   BREAST LUMPECTOMY WITH RADIOACTIVE SEED AND SENTINEL LYMPH NODE BIOPSY (Left)   BREAST LUMPECTOMY WITH RADIOACTIVE SEED AND SENTINEL LYMPH NODE BIOPSY Left 07/27/2016   Procedure: BREAST LUMPECTOMY WITH RADIOACTIVE SEED AND SENTINEL LYMPH NODE BIOPSY;  Surgeon: Lockie Rima, MD;  Location: [facility];  Service: General;  Laterality: Left;   CARDIAC CATHETERIZATION N/A 12/24/2015   Procedure: Right Heart Cath;  Surgeon: Knox Perl, MD;  Location: [facility] INVASIVE CV LAB;  Service: Cardiovascular;  Laterality: N/A;   CESAREAN SECTION     COLONOSCOPY     COLONOSCOPY WITH PROPOFOL  N/A 09/02/2017   Procedure: COLONOSCOPY WITH PROPOFOL ;  Surgeon: Baldo Bonds, MD;  Location: [facility];  Service: Endoscopy;  Laterality: N/A;   COLONOSCOPY WITH PROPOFOL  Bilateral 12/31/2020   Procedure: COLONOSCOPY WITH PROPOFOL ;  Surgeon: Baldo Bonds, MD;  Location: [facility];  Service: Endoscopy;  Laterality: Bilateral;   ESOPHAGOGASTRODUODENOSCOPY (EGD) WITH PROPOFOL  N/A 09/02/2017   Procedure: ESOPHAGOGASTRODUODENOSCOPY (EGD) WITH PROPOFOL ;  Surgeon: Baldo Bonds, MD;  Location: [facility];  Service: Endoscopy;  Laterality: N/A;   ESOPHAGOGASTRODUODENOSCOPY (EGD) WITH PROPOFOL  Bilateral 12/31/2020   Procedure: ESOPHAGOGASTRODUODENOSCOPY (EGD) WITH PROPOFOL ;  Surgeon: Baldo Bonds, MD;  Location: [facility];  Service: Endoscopy;  Laterality: Bilateral;   IR CV LINE INJECTION  07/27/2017   POLYPECTOMY  2008   Removal of uterine polyp   PORT-A-CATH REMOVAL Right 09/09/2017   Procedure: REMOVAL PORT-A-CATH;  Surgeon: Lockie Rima, MD;  Location: [facility];  Service: General;  Laterality: Right;   PORTA CATH INSERTION  07/27/2016   PORTACATH PLACEMENT Right 07/27/2016   Procedure: INSERTION PORT-A-CATH;  Surgeon: Lockie Rima, MD;  Location: [facility];  Service: General;  Laterality: Right;   SUBMUCOSAL INJECTION  09/02/2017    Procedure: SUBMUCOSAL INJECTION;  Surgeon: Baldo Bonds, MD;  Location: [facility];  Service: Endoscopy;;  epi injection   Family History  Problem Relation Age of Onset   Hypertension Mother    Kidney disease Mother    Heart disease Mother    Alcoholism Mother    Hypertension Father    Heart disease Father    Stroke Sister    Hypertension Brother    Breast cancer Maternal Grandmother        died at 52   Breast cancer Paternal Grandmother    Cancer Maternal Aunt  unknown form   Breast cancer Paternal Aunt    Breast cancer Paternal Aunt    Breast cancer Cousin        pat first cousin dx in her 30s   Colon cancer Other 21       MGMs brother   Cancer Other        breast ca in [place]   Cancer Other        g uncle colon or stomach ca   Social History   Socioeconomic History   Marital status: Single    Spouse name: Not on file   Number of children: 1   Years of education: Not on file   Highest education level: Not on file  Occupational History   Occupation: Film/video editor, pharmacy tech  Tobacco Use   Smoking status: Former    Current packs/day: 0.00    Average packs/day: 0.3 packs/day for 10.0 years (3.3 ttl pk-yrs)    Types: Cigarettes    Start date: 10/03/2001    Quit date: 10/04/2011    Years since quitting: 11.9   Smokeless tobacco: Never   Tobacco comments:    No longer smoking cigarettes  Vaping Use   Vaping status: Never Used  Substance and Sexual Activity   Alcohol use: Yes    Alcohol/week: 0.0 standard drinks of alcohol    Comment: social   Drug use: No   Sexual activity: Not Currently  Other Topics Concern   Not on file  Social History Narrative   Single, lives alone with her children   Lives in [place]    worked Fish farm manager at [employer]    Occupation: Programmer, 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 [employer]   As of 2022 on disability due to pulm artery htn       Children: boys   Grew up in [place]   Social Drivers of [employer]  Strain: Low Risk  (09/08/2023)   Overall Financial Resource Strain (CARDIA)    Difficulty of Paying Living Expenses: Not hard at all  Food Insecurity: No Food Insecurity (09/08/2023)   Hunger Vital Sign    Worried About Running Out of Food in the Last Year: Never true    Ran Out of Food in the Last Year: Never true  Transportation Needs: No Transportation Needs (09/08/2023)   PRAPARE - Administrator, Civil Service (Medical): No    Lack of Transportation (Non-Medical): No  Physical Activity: Sufficiently Active (09/08/2023)   Exercise Vital Sign    Days of Exercise per Week: 3 days    Minutes of Exercise per Session: 60 min  Stress: Stress Concern Present (09/08/2023)   [employer] of Occupational Health - Occupational Stress Questionnaire    Feeling of Stress : To some extent  Social Connections: Moderately Integrated (09/08/2023)   Social Connection and Isolation Panel [NHANES]    Frequency of Communication with Friends and Family: More than three times a week    Frequency of Social Gatherings with Friends and Family: More than three times a week    Attends Religious Services: More than 4 times per year    Active Member of [employer] or Organizations: Yes    Attends Engineer, structural: More than 4 times per year    Marital Status: Never married    Tobacco Counseling Counseling given: Not Answered Tobacco comments: No longer smoking cigarettes    Clinical Intake:  Pre-visit preparation completed: Yes  Pain : No/denies pain  Nutritional Risks: None Diabetes: No  Lab Results  Component Value Date   HGBA1C 5.1 04/13/2023   HGBA1C 5.2 11/12/2022   HGBA1C 5.1 09/09/2022     How often do you need to have someone help you when you read instructions, pamphlets, or other written materials from your doctor or pharmacy?: 1 - Never  Interpreter Needed?: No  Information entered by :: NAllen LPN   Activities of Daily Living     09/08/2023   11:36 AM   In your present state of health, do you have any difficulty performing the following activities:  Hearing? 0  Vision? 0  Difficulty concentrating or making decisions? 0  Walking or climbing stairs? 1  Comment gets out of breath  Dressing or bathing? 0  Doing errands, shopping? 0  Preparing Food and eating ? N  Using the Toilet? N  In the past six months, have you accidently leaked urine? N  Do you have problems with loss of bowel control? N  Managing your Medications? N  Managing your Finances? N  Housekeeping or managing your Housekeeping? N    Patient Care Team: Susanna Epley, FNP as PCP - General (General Practice) Bari Boos, FNP as Nurse Practitioner (Nurse Practitioner) Lockie Rima, MD as Consulting Physician (General Surgery) Cameron Cea, MD as Consulting Physician (Hematology and Oncology) Retta Caster, MD as Consulting Physician (Radiation Oncology) Debbie Fails Laura Polio, NP as Nurse Practitioner (Hematology and Oncology)  I have updated your Care Teams any recent Medical Services you may have received from other providers in the past year.     Assessment:    This is a routine wellness examination for Bedias.  Hearing/Vision screen Hearing Screening - Comments:: Denies hearing issues Vision Screening - Comments:: Regular eye exams, [facility]   Goals Addressed             This Visit's Progress    Patient Stated       09/08/2023, wants to lose weight       Depression Screen     09/08/2023   11:46 AM 04/13/2023   11:45 AM 09/09/2022   11:21 AM 04/30/2022   11:22 AM 12/02/2021    9:59 AM 07/24/2021    9:12 AM 05/23/2021   12:19 PM  PHQ 2/9 Scores  PHQ - 2 Score 0 1 0 1 0 3 0  PHQ- 9 Score 6 4 3 4  8      Fall Risk     09/08/2023   11:46 AM 04/13/2023   11:45 AM 09/09/2022   11:21 AM 04/30/2022   11:22 AM 12/02/2021    9:59 AM  Fall Risk   Falls in the past year? 0 0 0 0 0  Number falls in past yr: 0 0 0 0 0  Injury with Fall? 0 0 0 0 0   Risk for fall due to : No Fall Risks;Medication side effect No Fall Risks No Fall Risks No Fall Risks No Fall Risks  Follow up Falls evaluation completed;Falls prevention discussed Falls evaluation completed Falls evaluation completed Falls evaluation completed Falls evaluation completed    MEDICARE RISK AT HOME:  Medicare Risk at Home Any stairs in or around the home?: No If so, are there any without handrails?: No Home free of loose throw rugs in walkways, pet beds, electrical cords, etc?: Yes Adequate lighting in your home to reduce risk of falls?: Yes Life alert?: No Use of a cane, walker or w/c?: No Grab bars in the bathroom?: Yes Shower  chair or bench in shower?: No Elevated toilet seat or a handicapped toilet?: No  TIMED UP AND GO:  Was the test performed?  No  Cognitive Function: 6CIT completed        09/08/2023   11:48 AM 09/09/2022   11:34 AM  6CIT Screen  What Year? 0 points 0 points  What month? 0 points 0 points  What time? 0 points 3 points  Count back from 20 0 points 0 points  Months in reverse 0 points 0 points  Repeat phrase 0 points 6 points  Total Score 0 points 9 points    Immunizations Immunization History  Administered Date(s) Administered   DTaP 04/26/2013   Hepatitis A 03/21/2018   Hepatitis A, Adult 03/21/2018, 03/14/2020   Hepatitis A, Ped/Adol-2 Dose 03/21/2018   Hepatitis B 03/21/2018   Hepb-cpg 03/21/2018   Influenza Split 01/24/2013, 01/15/2014, 01/08/2015, 01/21/2015, 01/23/2016   Influenza, Seasonal, Injecte, Preservative Fre 01/11/2023   Influenza,inj,Quad PF,6+ Mos 01/15/2020, 01/03/2021, 03/13/2022   Influenza-Unspecified 01/08/2015, 01/24/2016, 12/28/2017, 11/29/2018   PFIZER(Purple Top)SARS-COV-2 Vaccination 05/26/2019, 06/23/2019, 03/15/2020   Pneumococcal Conjugate-13 10/25/2020   Pneumococcal Polysaccharide-23 03/12/2014   Tdap 06/21/2017    Screening Tests Health Maintenance  Topic Date Due   COVID-19 Vaccine (4 -  2024-25 season) 09/24/2023 (Originally 11/29/2022)   HEMOGLOBIN A1C  10/11/2023   INFLUENZA VACCINE  10/29/2023   Diabetic kidney evaluation - eGFR measurement  04/12/2024   Diabetic kidney evaluation - Urine ACR  08/10/2024   Medicare Annual Wellness (AWV)  09/07/2024   MAMMOGRAM  03/25/2025   Pneumococcal Vaccine 76-78 Years old (3 of 3 - PCV20 or PCV21) 10/25/2025   DTaP/Tdap/Td (3 - Td or Tdap) 06/22/2027   Colonoscopy  01/01/2031   Hepatitis C Screening  Completed   HIV Screening  Completed   HPV VACCINES  Aged Out   Meningococcal B Vaccine  Aged Out   FOOT EXAM  Discontinued   OPHTHALMOLOGY EXAM  Discontinued   Zoster Vaccines- Shingrix  Discontinued    Health Maintenance  There are no preventive care reminders to display for this patient.  Health Maintenance Items Addressed: Declines covid vaccine.  Additional Screening:  Vision Screening: Recommended annual ophthalmology exams for early detection of glaucoma and other disorders of the eye. Would you like a referral to an eye doctor? No    Dental Screening: Recommended annual dental exams for proper oral hygiene  Community Resource Referral / Chronic Care Management: CRR required this visit?  Yes   CCM required this visit?  No   Plan:    I have personally reviewed and noted the following in the patient's chart:   Medical and social history Use of alcohol, tobacco or illicit drugs  Current medications and supplements including opioid prescriptions. Patient is not currently taking opioid prescriptions. Functional ability and status Nutritional status Physical activity Advanced directives List of other physicians Hospitalizations, surgeries, and ER visits in previous 12 months Vitals Screenings to include cognitive, depression, and falls Referrals and appointments  In addition, I have reviewed and discussed with patient certain preventive protocols, quality metrics, and best practice recommendations. A  written personalized care plan for preventive services as well as general preventive health recommendations were provided to patient.   Areatha Beecham, LPN   10/07/6267   After Visit Summary: (MyChart) Due to this being a telephonic visit, the after visit summary with patients personalized plan was offered to patient via MyChart   Notes: Nothing significant to report at this time.

## 2023-09-08 NOTE — Patient Instructions (Signed)
 [name] , Thank you for taking time out of your busy schedule to complete your Annual Wellness Visit with me. I enjoyed our conversation and look forward to speaking with you again next year. I, as well as your care team,  appreciate your ongoing commitment to your health goals. Please review the following plan we discussed and let me know if I can assist you in the future. Your Game plan/ To Do List    Referrals: If you haven't heard from the office you've been referred to, please reach out to them at the phone provided.  Referral to VBCI Follow up Visits: Next Medicare AWV with our clinical staff: office will schedule   Have you seen your provider in the last 6 months (3 months if uncontrolled diabetes)? Yes Next Office Visit with your provider: 02/14/2024 at 11:00  Clinician Recommendations:  Aim for 30 minutes of exercise or brisk walking, 6-8 glasses of water, and 5 servings of fruits and vegetables each day.       This is a list of the screening recommended for you and due dates:  Health Maintenance  Topic Date Due   COVID-19 Vaccine (4 - 2024-25 season) 09/24/2023*   Hemoglobin A1C  10/11/2023   Flu Shot  10/29/2023   Yearly kidney function blood test for diabetes  04/12/2024   Yearly kidney health urinalysis for diabetes  08/10/2024   Medicare Annual Wellness Visit  09/07/2024   Mammogram  03/25/2025   Pneumococcal Vaccination (3 of 3 - PCV20 or PCV21) 10/25/2025   DTaP/Tdap/Td vaccine (3 - Td or Tdap) 06/22/2027   Colon Cancer Screening  01/01/2031   Hepatitis C Screening  Completed   HIV Screening  Completed   HPV Vaccine  Aged Out   Meningitis B Vaccine  Aged Out   Complete foot exam   Discontinued   Eye exam for diabetics  Discontinued   Zoster (Shingles) Vaccine  Discontinued  *Topic was postponed. The date shown is not the original due date.    Advanced directives: (Declined) Advance directive discussed with you today. Even though you declined this today, please  call our office should you change your mind, and we can give you the proper paperwork for you to fill out. Advance Care Planning is important because it:  [x]  Makes sure you receive the medical care that is consistent with your values, goals, and preferences  [x]  It provides guidance to your family and loved ones and reduces their decisional burden about whether or not they are making the right decisions based on your wishes.  Follow the link provided in your after visit summary or read over the paperwork we have mailed to you to help you started getting your Advance Directives in place. If you need assistance in completing these, please reach out to us  so that we can help you!  See attachments for Preventive Care and Fall Prevention Tips.

## 2023-09-09 ENCOUNTER — Telehealth: Payer: Self-pay

## 2023-09-09 NOTE — Progress Notes (Signed)
 Complex Care Management Note  Care Guide Note 09/09/2023 Name: [name] MRN: [id number] DOB: 17-Aug-1965  [name] is a 58 y.o. year old female who sees Susanna Epley, FNP for primary care. I reached out to Lonie Roa by phone today to offer complex care management services.  [name] was given information about Complex Care Management services today including:   The Complex Care Management services include support from the care team which includes your Nurse Care Manager, Clinical Social Worker, or Pharmacist.  The Complex Care Management team is here to help remove barriers to the health concerns and goals most important to you. Complex Care Management services are voluntary, and the patient may decline or stop services at any time by request to their care team member.   Complex Care Management Consent Status: Patient agreed to services and verbal consent obtained.   Follow up plan:  Telephone appointment with complex care management team member scheduled for:  09-14-23   Encounter Outcome:  Patient Scheduled   [name] [facility], [facility] Guide  Direct Dial: [phone]  Fax [phone]

## 2023-09-09 NOTE — Progress Notes (Signed)
 Complex Care Management Note  Care Guide Note 09/09/2023 Name: [name] MRN: [id number] DOB: 1965-09-28  [name] is a 58 y.o. year old female who sees Susanna Epley, FNP for primary care. I reached out to Lonie Roa by phone today to offer complex care management services.  Ms. Lawrance was given information about Complex Care Management services today including:   The Complex Care Management services include support from the care team which includes your Nurse Care Manager, Clinical Social Worker, or Pharmacist.  The Complex Care Management team is here to help remove barriers to the health concerns and goals most important to you. Complex Care Management services are voluntary, and the patient may decline or stop services at any time by request to their care team member.   Complex Care Management Consent Status: Patient agreed to services and verbal consent obtained.   Follow up plan:  Telephone appointment with complex care management team member scheduled for:  10-18-23  Encounter Outcome:  Patient Scheduled  [name] [facility], [facility] Guide  Direct Dial: [phone]  Fax [phone]

## 2023-09-14 ENCOUNTER — Other Ambulatory Visit: Payer: Self-pay | Admitting: Licensed Clinical Social Worker

## 2023-09-14 NOTE — Progress Notes (Signed)
 SUBJECTIVE: Discussed the use of AI scribe software for clinical note transcription with the patient, who gave verbal consent to proceed.  Chief Complaint: Obesity  Interim History: She is down 2 lbs since last visit.  Muscle mass + 4.2 lbs Adipose mass - 7.6 lbs  Jadwiga is here to discuss her progress with her obesity treatment plan. She is on the Category 2 Plan and states she is following her eating plan approximately 85 % of the time. She states she is doing aerobics for exercise 60 minutes 3 times per week. DESHAWN SKELLEY is a 58 year old female who presents for follow-up of her obesity treatment plan.  She is adhering to her category two obesity treatment plan 85% of the time and has lost two pounds since her last visit. Fasting labs and direct telemetry were not completed as she did not fast for this visit. She did have labs done thru pulmonary and PCP at [facility] and these were reviewed.   She has type 2 diabetes and is currently on Ozempic  2 mg weekly since January 2025, having previously been on Mounjaro . No gastrointestinal symptoms such as nausea, vomiting, diarrhea, constipation, or difficulty swallowing related to Ozempic . She notes a change in her vision but is unsure if it is related to her medication, and is uncertain about mood changes.  She has pulmonary hypertension and was recently transitioned from IV treprostinil  to oral medications. She experiences difficulty tolerating her medication due to the need to consume a certain number of calories to avoid feeling unwell. She avoids humidity as it exacerbates her breathing difficulties and experiences fatigue when walking, especially while carrying her oxygen  tank.  She is on Xarelto  for history of DVTPE with no reported issues.  She experiences poor sleep, averaging three to four hours per night, with frequent awakenings. She lacks a bedtime routine and sometimes uses her phone before bed, which does not seem to affect  her ability to fall asleep. Going to bed earlier results in waking up earlier and staying awake for the rest of the day.  She takes vitamin D  once a week but often forgets to take it, despite having plenty at home.   Pharmacotherapy: Currently Ozempic  2 mg weekly - Resumed Ozempic  02/18/23    Mounjaro  - stopped 02/18/23 as felt she did better on Ozempic     Prescribed wellbutrin  for EEB 01/2022- but did not start as did not want another medication OBJECTIVE: Visit Diagnoses: Problem List Items Addressed This Visit     Pulmonary HTN (HCC)   Type 2 diabetes mellitus with microalbuminuria, without long-term current use of insulin  (HCC) - Primary   Relevant Medications   Semaglutide , 2 MG/DOSE, (OZEMPIC , 2 MG/DOSE,) 8 MG/3ML SOPN   Obesity (HCC)-start bmi 40.10   Relevant Medications   Semaglutide , 2 MG/DOSE, (OZEMPIC , 2 MG/DOSE,) 8 MG/3ML SOPN   Other Visit Diagnoses       Hyperlipidemia associated with type 2 diabetes mellitus (HCC)       Relevant Medications   Semaglutide , 2 MG/DOSE, (OZEMPIC , 2 MG/DOSE,) 8 MG/3ML SOPN     Sleep disturbance         Vision changes         BMI 39.0-39.9,adult Current BMI 39.4         Obesity Briell Paulette is on a category two obesity treatment plan with 85% adherence. She has lost two pounds, gained five pounds in muscle mass, and lost seven pounds in adipose tissue. Her visceral adipose rating  improved to 15, with a target of 12 or lower. The current treatment plan, including medication and lifestyle changes, is effective, and reduced stress is positively impacting her progress. - Continue current obesity treatment plan - Encourage adherence to nutrition and exercise regimen - Monitor weight and body composition changes  Type 2 Diabetes Mellitus Sanayah has been on Ozempic  2 mg weekly since January 2025 for type 2 diabetes management, previously using Mounjaro . Blood sugar levels are stable, and point of care blood ketosis was normal. No adverse  effects from Ozempic , such as nausea, vomiting, diarrhea, or constipation, were reported. She does not regularly check her blood sugars at home. On Statin On ARB ? Vision changes- Patient feels may be related to med changes for [facility] and plans to discuss at next visit. No sudden visual changes and not symptomatic today.  - Refill Ozempic  prescription - Monitor blood sugar levels as needed Meds ordered this encounter  Medications   Semaglutide , 2 MG/DOSE, (OZEMPIC , 2 MG/DOSE,) 8 MG/3ML SOPN    Sig: Inject 2 mg into the skin once a week.    Dispense:  3 mL    Refill:  1    Pulmonary Hypertension Toriana transitioned from IV treprostinil  to oral medications for pulmonary hypertension. She reports difficulty tolerating the medication due to caloric intake requirements to avoid feeling sick. She is scheduled to see her pulmonologist on the 27th for potential medication adjustment. Fatigue when walking, exacerbated by carrying an oxygen  tank, is noted. She is advised to continue indoor activities to avoid humidity, which worsens symptoms. - Discuss medication tolerance and potential adjustment with pulmonologist - Encourage continued indoor activity to avoid humidity - Monitor exercise tolerance and fatigue levels   Hyperlipidemia LDL is not at goal. Medication(s): Lipitor 20 mg daily Cardiovascular risk factors: diabetes mellitus, dyslipidemia, hypertension, obesity (BMI >= 30 kg/m2), sedentary lifestyle, smoking/ tobacco exposure, and PAH  Lab Results  Component Value Date   CHOL 221 (H) 04/13/2023   HDL 56 04/13/2023   LDLCALC 142 (H) 04/13/2023   TRIG 131 04/13/2023   CHOLHDL 3.9 04/13/2023   CHOLHDL 3.9 09/09/2022   CHOLHDL 4.2 04/30/2022   Lab Results  Component Value Date   ALT 12 01/13/2023   AST 15 01/13/2023   ALKPHOS 70 01/13/2023   BILITOT 0.8 01/13/2023   The 10-year ASCVD risk score (Arnett DK, et al., 2019) is: 13%   Values used to calculate the score:     Age: 16  years     Clincally relevant sex: Female     Is Non-Hispanic African American: Yes     Diabetic: Yes     Tobacco smoker: No     Systolic Blood Pressure: 125 mmHg     Is BP treated: Yes     HDL Cholesterol: 56 mg/dL     Total Cholesterol: 221 mg/dL  Plan: Planned to repeat lipids today, but not fasting. Will need to plan to repeat over the next 2-3 months unless done elsewhere.  Continue statin. Continue Ozempic  Continue to work on nutrition plan -decreasing simple carbohydrates, increasing lean proteins, decreasing saturated fats and cholesterol , avoiding trans fats and exercise as able to promote weight loss, improve lipids and decrease cardiovascular risks.    Sleep Disturbance Remi reports difficulty sleeping, averaging three to four hours per night with frequent awakenings. She lacks a bedtime routine and uses her phone before bed. Chronic sleep disturbance may impact her overall health, including cardiac and respiratory health. - Discuss sleep issues  with healthcare provider - Consider establishing a bedtime routine to improve sleep quality  Vision Changes Monike reports vision changes potentially related to her recent medication change for pulmonary hypertension. Her last eye exam in December showed no issues. Vision changes coincide with the medication change for [facility], and she plans to discuss this with her pulmonologist at follow up visit 09/24/23. - Discuss vision changes with pulmonologist - Consider follow-up with an eye specialist if needed  General Health Maintenance Normalee is on vitamin D  supplementation but struggles with adherence. She is advised to take it earlier in the day to avoid potential sleep interference. - Encourage adherence to vitamin D  supplementation schedule - Take vitamin D  earlier in the day to avoid sleep interference  Follow-up Cyndee has follow-up appointments scheduled for July 16th and August 11th at 11 AM to monitor her progress. - Attend  follow-up appointment on July 16th - Attend follow-up appointment on August 11th  Vitals Temp: 98.3 F (36.8 C) BP: 125/83 Pulse Rate: 95 SpO2: 93 % (3 Liters of Oygen)   Anthropometric Measurements Height: 5' 5 (1.651 m) Weight: 237 lb (107.5 kg) BMI (Calculated): 39.44 Weight at Last Visit: 239 lb Weight Lost Since Last Visit: 2lb Weight Gained Since Last Visit: 0 Starting Weight: 241 lb Total Weight Loss (lbs): 4 lb (1.814 kg) Peak Weight: 256 lb   Body Composition  Body Fat %: 48.2 % Fat Mass (lbs): 114.2 lbs Muscle Mass (lbs): 116.6 lbs Total Body Water (lbs): 78.6 lbs Visceral Fat Rating : 15   Other Clinical Data Fasting: No Labs: No Today's Visit #: 27 Starting Date: 07/24/21     ASSESSMENT AND PLAN:  Diet: British is currently in the action stage of change. As such, her goal is to continue with weight loss efforts. She has agreed to Category 2 Plan.  Exercise: Nyeema has been instructed to work up to a goal of 150 minutes of combined cardio and strengthening exercise per week and to continue exercising as is for weight loss and overall health benefits.   Behavior Modification:  We discussed the following Behavioral Modification Strategies today: increasing lean protein intake, decreasing simple carbohydrates, increasing vegetables, increase H2O intake, increase high fiber foods, no skipping meals, meal planning and cooking strategies, emotional eating strategies , avoiding temptations, and planning for success. We discussed various medication options to help [facility] with her weight loss efforts and we both agreed to continue current treatment plan, continue to work on nutritional and behavioral strategies to promote weight loss.  .  Return in about 4 weeks (around 10/13/2023).Aaron Aas She was informed of the importance of frequent follow up visits to maximize her success with intensive lifestyle modifications for her multiple health conditions.  Attestation  Statements:   Reviewed by clinician on day of visit: allergies, medications, problem list, medical history, surgical history, family history, social history, and previous encounter notes.   Time spent on visit including pre-visit chart review and post-visit care and charting was 36 minutes.    Brooks Stotz, PA-C

## 2023-09-14 NOTE — Patient Instructions (Signed)
 Visit Information  Thank you for taking time to visit with me today. Please don't hesitate to contact me if I can be of assistance to you before our next scheduled appointment.  Our next appointment is by telephone no further scheduled appointments.   Please call the care guide team at [phone] if you need to cancel or reschedule your appointment.   Following is a copy of your care plan:   Goals Addressed             This Visit's Progress    BSW VBCI Social Work Care Plan       Problems:   Depression   and Stress  CSW Clinical Goal(s):   Over the next 2 weeks the Patient will will follow up with LCSW as directed by Social Work.  Interventions:  Patient has an appointment with the LCSW on 09/24/2023 at 11:00 am  Patient Goals/Self-Care Activities:  Keep appointment with LCSW  Plan:   No further follow up required: with BSW but has an initial appointment with LCSW        Please call the Suicide and Crisis Lifeline: 988 go to [facility] [address], [place] [phone]) call 911 if you are experiencing a Mental Health or Behavioral Health Crisis or need someone to talk to.  Patient verbalizes understanding of instructions and care plan provided today and agrees to view in MyChart. Active MyChart status and patient understanding of how to access instructions and care plan via MyChart confirmed with patient.     Jonda Neighbours, PhD [facility], [facility] Social Worker Direct Dial: [phone]  Fax: [phone]

## 2023-09-14 NOTE — Patient Outreach (Signed)
 Complex Care Management   Visit Note  09/14/2023  Name:  [name] MRN: [id number] DOB: 06-21-1965  Situation: Referral received for Complex Care Management related to SDOH Barriers:  Depression Stress I obtained verbal consent from Patient.  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   Menorrhagia    secondary to uterine fibroids   OSA (obstructive sleep apnea)    07/25/13 HST AHI 33/hr, severe hypoxemia O2 min 42% and 95% of the time <89%   PE (pulmonary embolism) 2014   bilateral   Prediabetes    Pulmonary artery hypertension (HCC)    Pulmonary nodule    (5mm on loeft lower lobe) found on CT scan July 2014, repeat scan Jan 2015 showed less than 4mm   Right ovarian cyst    noted 09/2012    S/P TAH (total abdominal hysterectomy) 06/07/2013   Sleep apnea    SOB (shortness of breath) on exertion    Stomach ulcer    Trichomoniasis    05/2011    Vitamin D  deficiency     Assessment: Patient did not have any SDOH needs and has been scheduled with the LCSW for 09/24/2023 at 11:00 am   SDOH Interventions    Flowsheet Row Patient Outreach Telephone from 09/14/2023 in [facility] Clinical Support from 09/08/2023 in [facility] Office Visit from 04/13/2023 in [facility] Office Visit from 09/09/2022 in [facility] Office Visit from 04/30/2022 in [facility] Walk Test from 08/16/2020 in [facility]  SDOH Interventions        Food Insecurity Interventions Intervention Not Indicated Intervention Not Indicated -- Intervention Not Indicated -- --  Housing Interventions Intervention Not Indicated Intervention Not Indicated -- -- -- --  Transportation Interventions Intervention Not Indicated Intervention Not Indicated -- Intervention Not Indicated -- --  Utilities Interventions Intervention Not Indicated Intervention Not Indicated -- [id number] Referral -- --  Alcohol Usage Interventions -- Intervention Not Indicated (Score <7) -- Intervention Not Indicated (Score <7) -- --  Depression Interventions/Treatment  -- --  [referred to VBCI] Counseling -- Counseling  [will be referring to counseling] Patient refuses Treatment  Financial Strain Interventions Intervention Not Indicated Intervention Not Indicated -- [id number] Referral -- --  Physical Activity Interventions -- Intervention Not Indicated -- Intervention Not Indicated -- --  Stress Interventions -- Other (Comment)  Priscella Brooms out to VBCI] -- Intervention Not Indicated -- --  Social Connections Interventions -- Intervention Not Indicated -- Intervention Not Indicated -- --  Health Literacy Interventions -- Intervention Not Indicated -- -- -- --    Recommendation:   none      Follow Up Plan:   Closing From:  Complex Care Management  Jonda Neighbours, PhD [facility], [facility] Social Worker Direct Dial: [phone]  Fax: [phone]

## 2023-09-15 ENCOUNTER — Other Ambulatory Visit (HOSPITAL_COMMUNITY): Payer: Self-pay

## 2023-09-15 ENCOUNTER — Encounter (INDEPENDENT_AMBULATORY_CARE_PROVIDER_SITE_OTHER): Payer: Self-pay | Admitting: Physician Assistant

## 2023-09-15 ENCOUNTER — Ambulatory Visit (INDEPENDENT_AMBULATORY_CARE_PROVIDER_SITE_OTHER): Admitting: Physician Assistant

## 2023-09-15 VITALS — BP 125/83 | HR 95 | Temp 98.3°F | Ht 65.0 in | Wt 237.0 lb

## 2023-09-15 DIAGNOSIS — G479 Sleep disorder, unspecified: Secondary | ICD-10-CM

## 2023-09-15 DIAGNOSIS — H539 Unspecified visual disturbance: Secondary | ICD-10-CM

## 2023-09-15 DIAGNOSIS — K760 Fatty (change of) liver, not elsewhere classified: Secondary | ICD-10-CM

## 2023-09-15 DIAGNOSIS — I272 Pulmonary hypertension, unspecified: Secondary | ICD-10-CM

## 2023-09-15 DIAGNOSIS — E785 Hyperlipidemia, unspecified: Secondary | ICD-10-CM

## 2023-09-15 DIAGNOSIS — E1169 Type 2 diabetes mellitus with other specified complication: Secondary | ICD-10-CM

## 2023-09-15 DIAGNOSIS — Z7985 Long-term (current) use of injectable non-insulin antidiabetic drugs: Secondary | ICD-10-CM

## 2023-09-15 DIAGNOSIS — E669 Obesity, unspecified: Secondary | ICD-10-CM

## 2023-09-15 DIAGNOSIS — E1129 Type 2 diabetes mellitus with other diabetic kidney complication: Secondary | ICD-10-CM | POA: Diagnosis not present

## 2023-09-15 DIAGNOSIS — R809 Proteinuria, unspecified: Secondary | ICD-10-CM | POA: Diagnosis not present

## 2023-09-15 DIAGNOSIS — K5903 Drug induced constipation: Secondary | ICD-10-CM

## 2023-09-15 DIAGNOSIS — E559 Vitamin D deficiency, unspecified: Secondary | ICD-10-CM

## 2023-09-15 DIAGNOSIS — Z6839 Body mass index (BMI) 39.0-39.9, adult: Secondary | ICD-10-CM

## 2023-09-15 MED ORDER — OZEMPIC (2 MG/DOSE) 8 MG/3ML ~~LOC~~ SOPN
2.0000 mg | PEN_INJECTOR | SUBCUTANEOUS | 1 refills | Status: DC
  Filled 2023-09-15: qty 3, 28d supply, fill #0

## 2023-09-20 ENCOUNTER — Other Ambulatory Visit (HOSPITAL_COMMUNITY): Payer: Self-pay

## 2023-09-24 ENCOUNTER — Other Ambulatory Visit: Payer: Self-pay | Admitting: Licensed Clinical Social Worker

## 2023-09-24 LAB — COMPREHENSIVE METABOLIC PANEL WITH GFR: Calcium: 9.3 (ref 8.7–10.7)

## 2023-09-24 LAB — BASIC METABOLIC PANEL WITH GFR
BUN: 12 (ref 4–21)
CO2: 29 — AB (ref 13–22)
Chloride: 106 (ref 99–108)
Creatinine: 0.8 (ref 0.5–1.1)
Glucose: 88
Potassium: 3.9 meq/L (ref 3.5–5.1)
Sodium: 143 (ref 137–147)

## 2023-09-24 NOTE — Patient Outreach (Signed)
 LCSW introduced self and explained role in Complex Care Management. Patient requested to re-schedule due to having conflicting medical appts. Appt was rescheduled for 07/08 at 11:30 AM

## 2023-09-28 ENCOUNTER — Other Ambulatory Visit (HOSPITAL_COMMUNITY): Payer: Self-pay

## 2023-10-04 ENCOUNTER — Telehealth (HOSPITAL_COMMUNITY): Payer: Self-pay

## 2023-10-04 NOTE — Telephone Encounter (Signed)
 Received referral from Dr. Heron Hitt from [facility] for this pt to participate in Pulmonary Rehab with the diagnosis of Pulm HTN. Clinical review of pt follow up appt on 09/24/23 Pulmonary office note. Pt with uncontrolled HTN. Called pt and advised her to log blood pressures daily, 1-2 hours after taking meds and make an appointment with her PCP for clearance to start Pulm Rehab. Pt understands and agrees with plan. Will follow along and will schedule when appropriate.    Ronal Levin, RN, BSN Cardiac and Pulmonary Rehab

## 2023-10-05 ENCOUNTER — Telehealth: Payer: Self-pay | Admitting: Licensed Clinical Social Worker

## 2023-10-06 ENCOUNTER — Other Ambulatory Visit: Payer: Self-pay | Admitting: Licensed Clinical Social Worker

## 2023-10-06 ENCOUNTER — Encounter: Payer: Self-pay | Admitting: Internal Medicine

## 2023-10-06 ENCOUNTER — Ambulatory Visit (INDEPENDENT_AMBULATORY_CARE_PROVIDER_SITE_OTHER): Admitting: Internal Medicine

## 2023-10-06 VITALS — BP 160/100 | HR 95 | Temp 98.3°F | Ht 65.0 in | Wt 248.4 lb

## 2023-10-06 DIAGNOSIS — I5032 Chronic diastolic (congestive) heart failure: Secondary | ICD-10-CM

## 2023-10-06 DIAGNOSIS — E1129 Type 2 diabetes mellitus with other diabetic kidney complication: Secondary | ICD-10-CM

## 2023-10-06 DIAGNOSIS — R809 Proteinuria, unspecified: Secondary | ICD-10-CM | POA: Diagnosis not present

## 2023-10-06 DIAGNOSIS — I110 Hypertensive heart disease with heart failure: Secondary | ICD-10-CM | POA: Diagnosis not present

## 2023-10-06 DIAGNOSIS — M1A9XX0 Chronic gout, unspecified, without tophus (tophi): Secondary | ICD-10-CM

## 2023-10-06 DIAGNOSIS — F4321 Adjustment disorder with depressed mood: Secondary | ICD-10-CM

## 2023-10-06 DIAGNOSIS — I272 Pulmonary hypertension, unspecified: Secondary | ICD-10-CM

## 2023-10-06 NOTE — Patient Instructions (Signed)
 Hypertension, Adult Hypertension is another name for high blood pressure. High blood pressure forces your heart to work harder to pump blood. This can cause problems over time. There are two numbers in a blood pressure reading. There is a top number (systolic) over a bottom number (diastolic). It is best to have a blood pressure that is below 120/80. What are the causes? The cause of this condition is not known. Some other conditions can lead to high blood pressure. What increases the risk? Some lifestyle factors can make you more likely to develop high blood pressure: Smoking. Not getting enough exercise or physical activity. Being overweight. Having too much fat, sugar, calories, or salt (sodium) in your diet. Drinking too much alcohol. Other risk factors include: Having any of these conditions: Heart disease. Diabetes. High cholesterol. Kidney disease. Obstructive sleep apnea. Having a family history of high blood pressure and high cholesterol. Age. The risk increases with age. Stress. What are the signs or symptoms? High blood pressure may not cause symptoms. Very high blood pressure (hypertensive crisis) may cause: Headache. Fast or uneven heartbeats (palpitations). Shortness of breath. Nosebleed. Vomiting or feeling like you may vomit (nauseous). Changes in how you see. Very bad chest pain. Feeling dizzy. Seizures. How is this treated? This condition is treated by making healthy lifestyle changes, such as: Eating healthy foods. Exercising more. Drinking less alcohol. Your doctor may prescribe medicine if lifestyle changes do not help enough and if: Your top number is above 130. Your bottom number is above 80. Your personal target blood pressure may vary. Follow these instructions at home: Eating and drinking  If told, follow the DASH eating plan. To follow this plan: Fill one half of your plate at each meal with fruits and vegetables. Fill one fourth of your plate  at each meal with whole grains. Whole grains include whole-wheat pasta, brown rice, and whole-grain bread. Eat or drink low-fat dairy products, such as skim milk or low-fat yogurt. Fill one fourth of your plate at each meal with low-fat (lean) proteins. Low-fat proteins include fish, chicken without skin, eggs, beans, and tofu. Avoid fatty meat, cured and processed meat, or chicken with skin. Avoid pre-made or processed food. Limit the amount of salt in your diet to less than 1,500 mg each day. Do not drink alcohol if: Your doctor tells you not to drink. You are pregnant, may be pregnant, or are planning to become pregnant. If you drink alcohol: Limit how much you have to: 0-1 drink a day for women. 0-2 drinks a day for men. Know how much alcohol is in your drink. In the [place] one drink equals one 12 oz bottle of beer (355 mL), one 5 oz glass of wine (148 mL), or one 1 oz glass of hard liquor (44 mL). Lifestyle  Work with your doctor to stay at a healthy weight or to lose weight. Ask your doctor what the best weight is for you. Get at least 30 minutes of exercise that causes your heart to beat faster (aerobic exercise) most days of the week. This may include walking, swimming, or biking. Get at least 30 minutes of exercise that strengthens your muscles (resistance exercise) at least 3 days a week. This may include lifting weights or doing Pilates. Do not smoke or use any products that contain nicotine or tobacco. If you need help quitting, ask your doctor. Check your blood pressure at home as told by your doctor. Keep all follow-up visits. Medicines Take over-the-counter and prescription medicines  only as told by your doctor. Follow directions carefully. Do not skip doses of blood pressure medicine. The medicine does not work as well if you skip doses. Skipping doses also puts you at risk for problems. Ask your doctor about side effects or reactions to medicines that you should watch  for. Contact a doctor if: You think you are having a reaction to the medicine you are taking. You have headaches that keep coming back. You feel dizzy. You have swelling in your ankles. You have trouble with your vision. Get help right away if: You get a very bad headache. You start to feel mixed up (confused). You feel weak or numb. You feel faint. You have very bad pain in your: Chest. Belly (abdomen). You vomit more than once. You have trouble breathing. These symptoms may be an emergency. Get help right away. Call 911. Do not wait to see if the symptoms will go away. Do not drive yourself to the hospital. Summary Hypertension is another name for high blood pressure. High blood pressure forces your heart to work harder to pump blood. For most people, a normal blood pressure is less than 120/80. Making healthy choices can help lower blood pressure. If your blood pressure does not get lower with healthy choices, you may need to take medicine. This information is not intended to replace advice given to you by your health care provider. Make sure you discuss any questions you have with your health care provider. Document Revised: 01/02/2021 Document Reviewed: 01/02/2021 Elsevier Patient Education  2024 [employer].

## 2023-10-06 NOTE — Patient Outreach (Signed)
 Complex Care Management   Visit Note  10/06/2023  Name:  [name] MRN: [id number] DOB: 06-Sep-1965  Situation: Referral received for Complex Care Management related to Mental/Behavioral Health diagnosis Depression I obtained verbal consent from Patient.  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   Menorrhagia    secondary to uterine fibroids   OSA (obstructive sleep apnea)    07/25/13 HST AHI 33/hr, severe hypoxemia O2 min 42% and 95% of the time <89%   PE (pulmonary embolism) 2014   bilateral   Prediabetes    Pulmonary artery hypertension (HCC)    Pulmonary nodule    (5mm on loeft lower lobe) found on CT scan July 2014, repeat scan Jan 2015 showed less than 4mm   Right ovarian cyst    noted 09/2012    S/P TAH (total abdominal hysterectomy) 06/07/2013   Sleep apnea    SOB (shortness of breath) on exertion    Stomach ulcer    Trichomoniasis    05/2011    Vitamin D  deficiency     Assessment: Patient Reported Symptoms:  Cognitive Cognitive Status: Alert and oriented to person, place, and time Cognitive/Intellectual Conditions Management [RPT]: None reported or documented in medical history or problem list   Health Maintenance Behaviors: Annual physical exam  Neurological Neurological Review of Symptoms: No symptoms reported    HEENT HEENT Symptoms Reported: No symptoms reported      Cardiovascular Cardiovascular Symptoms Reported: No symptoms reported    Respiratory Respiratory Symptoms Reported: No  symptoms reported Respiratory Management Strategies: Coping strategies, BiPAP, Oxygen  therapy  Endocrine Endocrine Symptoms Reported: No symptoms reported    Gastrointestinal Gastrointestinal Symptoms Reported: No symptoms reported      Genitourinary Genitourinary Symptoms Reported: No symptoms reported    Integumentary Integumentary Symptoms Reported: No symptoms reported    Musculoskeletal Musculoskelatal Symptoms Reviewed: No symptoms reported   Falls in the past year?: No Number of falls in past year: 1 or less Was there an injury with Fall?: No Fall Risk Category Calculator: 0 Patient Fall Risk Level: Low Fall Risk    Psychosocial Psychosocial Symptoms Reported: Difficulty concentrating, Irritability, Report of significant loss, deaths, abandonment, traumatic incidents Behavioral Management Strategies: Coping strategies, Support system Major Change/Loss/Stressor/Fears (CP): Medical condition, self, Medical condition, family Techniques to [place] with Loss/Stress/Change: Diversional activities, Spiritual practice(s) Quality of Family Relationships: involved Do you feel physically threatened by others?: No      10/06/2023    2:48 PM  Depression screen PHQ 2/9  Decreased Interest 0  Down, Depressed, Hopeless 0  PHQ - 2 Score 0  Altered sleeping 0  Tired, decreased energy 0  Change in appetite 0  Feeling bad or failure about yourself  0  Trouble concentrating 0  Moving slowly or fidgety/restless 0  Suicidal thoughts 0  PHQ-9 Score 0  Difficult doing work/chores Not difficult at all    There were no  vitals filed for this visit.  Medications Reviewed Today     Reviewed by Ezzard Rolin BIRCH, LCSW (Social Worker) on 10/06/23 at 1440  Med List Status: <None>   Medication Order Taking? Sig Documenting Provider Last Dose Status Informant  acetaminophen  (TYLENOL ) 500 MG tablet [id number]  Take 1,000 mg by mouth every 6 (six) hours as needed for moderate pain or headache.   [provider]  Active Self  albuterol  (ACCUNEB ) 1.25 MG/3ML nebulizer solution [id number]  Take 3 mLs (1.25 mg total) by nebulization every 6 (six) hours as needed for wheezing. Georgina Speaks, FNP  Active   albuterol  (PROAIR  HFA) 108 [phone] Base) MCG/ACT inhaler [id number]  Inhale 1-2 puffs into the lungs every 6 (six) hours as needed for wheezing or shortness of breath. McLean-Scocuzza, Randine SAILOR, MD  Active   allopurinol  (ZYLOPRIM ) 300 MG tablet [id number]  TAKE 1 TABLET BY MOUTH EVERY DAY NEED APPT  Patient not taking: Reported on 09/15/2023   McLean-Scocuzza, Randine SAILOR, MD  Active            Med Note MARISA, NATHANEL SAILOR [place] Jan 13, 2023  3:18 PM)    anastrozole  (ARIMIDEX ) 1 MG tablet [id number]  TAKE 1 TABLET BY MOUTH EVERY DAY  Patient not taking: Reported on 09/15/2023   Gudena, Vinay, MD  Active   atorvastatin  (LIPITOR) 20 MG tablet [id number]  Take 1 tablet (20 mg total) by mouth daily.  Patient not taking: Reported on 09/15/2023   Georgina Speaks, FNP  Active   Azelastine  HCl 137 MCG/SPRAY SOLN [id number]  PLACE 1 SPRAY INTO BOTH NOSTRILS 2 (TWO) TIMES DAILY. USE IN EACH NOSTRIL AS DIRECTED Webb, Padonda B, FNP  Active   chlorpheniramine-HYDROcodone  (TUSSIONEX) 10-8 MG/5ML [id number]  Take 5 mLs by mouth every 12 (twelve) hours as needed for cough. Georgina Speaks, FNP  Active   clotrimazole -betamethasone  (LOTRISONE ) cream [id number]  Apply 1 application topically 2 (two) times daily. Prn leg dermatitis McLean-Scocuzza, Randine SAILOR, MD  Active   Colchicine  (MITIGARE ) 0.6 MG CAPS [id number]  Take 1 capsule by mouth as needed. As needed gout flare max # of pills 2 in 24 hours McLean-Scocuzza, Randine SAILOR, MD  Active   fluticasone  (FLONASE ) 50 MCG/ACT nasal spray [id number]  Place into both nostrils daily as needed for allergies or rhinitis. [provider]  Active Self  Fluticasone -Salmeterol (ADVAIR  HFA IN) [id number]  Inhale 1 puff into the lungs as needed. [provider]  Active   heparin   lock flush 100 unit/mL [id number]   Odean Potts, MD  Active   ibuprofen  (ADVIL ) 800 MG tablet [id number]  Take 1 tablet (800 mg total) by mouth every 8 (eight) hours as needed. McLean-Scocuzza, Randine SAILOR, MD  Active   linaclotide  (LINZESS ) 72 MCG capsule [id number]  Take 1 capsule (72 mcg total) by mouth daily before breakfast. Georgina Speaks, FNP  Active   loratadine  (CLARITIN ) 10 MG tablet [id number]  Take 1 tablet (10 mg total) by mouth daily. McLean-Scocuzza, Randine SAILOR, MD  Active   losartan  (COZAAR ) 25 MG tablet [id number]  Take 1 tablet (25 mg total) by mouth daily. Bensimhon, Toribio SAUNDERS, MD  Active   Magnesium  Cl-Calcium  Carbonate (SLOW MAGNESIUM /CALCIUM ) 70-117 MG TBEC [id number]  Take 2 tablets by mouth in the morning and at bedtime. Gudena, Vinay, MD  Active Self  metoprolol  tartrate (LOPRESSOR ) 25 MG tablet [id number]  TAKE 1 TABLET BY MOUTH TWICE A DAY McLean-Scocuzza, Randine SAILOR, MD  Active   Multiple Vitamin (MULTIVITAMIN) tablet [id number]  Take 1 tablet by mouth daily. [provider]  Active Self  omeprazole  (PRILOSEC) 40 MG capsule [id number]  Take 40 mg by mouth as needed. [provider]  Active   OPSUMIT  10 MG TABS [id number]  Take 10 mg by mouth daily. [provider]  Active Self  ORENITRAM  1 MG TBCR [id number]  Take by mouth. [provider]  Active   ORENITRAM  5 MG TBCR [id number]  Take by mouth. [provider]  Active   Polyethyl Glycol-Propyl Glycol (SYSTANE OP) [id number]  Place 1 drop into both eyes 3 (three) times daily as needed (dry/irritated eyes.). [provider]  Active Self  potassium chloride  SA (KLOR-CON ) 20 MEQ tablet [id number]  Take 1 tablet (20 mEq total) by mouth daily. Gudena, Vinay, MD  Active Self    Discontinued 08/17/16 0953   rivaroxaban  (XARELTO ) 20 MG TABS tablet [id number]  Take 20 mg by mouth daily with supper. [provider]  Active Self  Semaglutide , 2 MG/DOSE, (OZEMPIC , 2 MG/DOSE,) 8 MG/3ML SOPN  [id number]  Inject 2 mg into the skin once a week. Rayburn, Elouise Phlegm, PA-C  Active   sildenafil  (REVATIO ) 20 MG tablet [id number]  Take 1-2 tablets (20-40 mg total) by mouth 3 (three) times daily. As of 07/03/20 taking 20 mg tid per [facility] pulm Dr. LEvarge McLean-Scocuzza, Randine SAILOR, MD  Active   sodium chloride  flush (NS) 0.9 % injection 10 mL [id number]   Odean Potts, MD  Active   spironolactone  (ALDACTONE ) 50 MG tablet [id number]  Take 1 tablet (50 mg total) by mouth daily.  Patient not taking: Reported on 09/15/2023   McLean-Scocuzza, Randine SAILOR, MD  Active   torsemide  (DEMADEX ) 20 MG tablet [id number]  Take 20 mg by mouth daily as needed. [provider]  Active   treprostinil  (REMODULIN ) 20 MG/20ML injection [id number]  20 ng/kg/min by Continuous infusion (non-IV) route continuous.  Patient not taking: Reported on 09/15/2023   [provider]  Active Self  triamcinolone  cream (KENALOG ) 0.1 % [id number]  Apply 1 application topically daily as needed (leg discoloration). McLean-Scocuzza, Randine SAILOR, MD  Active Self  Vitamin D , Ergocalciferol , (DRISDOL ) 1.25 MG (50000 UNIT) CAPS capsule [id number]  Take 1 capsule (50,000 Units total) by mouth every 7 (seven) days. *Need office visit for refills* Rayburn, Elouise Phlegm, PA-C  Active   WINREVAIR 2 x 45 MG subcutaneous injection [id number]  Inject into the skin. [provider]  Active             Recommendation:   Continue Current Plan of Care  Follow Up Plan:   Telephone follow-up in 1 week  Rolin Kerns, LCSW [facility]  [facility], [facility] Clinical Social Worker Direct Dial: [phone]  Fax: [phone] Website: [email or web address] 3:05 PM

## 2023-10-06 NOTE — Progress Notes (Unsigned)
 I,[name], [name],acting as a Neurosurgeon for Tiffany LOISE Slocumb, MD.,have documented all relevant documentation on the behalf of Tiffany LOISE Slocumb, MD,as directed by  Tiffany LOISE Slocumb, MD while in the presence of Tiffany LOISE Slocumb, MD.  Subjective:  Patient ID: [name] , female    DOB: 20-Aug-1965 , 58 y.o.   MRN: [id number]  Chief Complaint  Patient presents with  . Hypertension    Patient presents today for bp & dm follow up. She reports compliance with medications. She states taking metoprolol  25MG  & losartan  25MG  at night. She admits slight headache, denies worsening SOB, chest pain.   . Diabetes    HPI  HPI   Past Medical History:  Diagnosis Date  . Anemia    iron deficiency  . Arthritis   . Borderline diabetes   . Complication of anesthesia    woke up slowly- after hysterectomy- 2015  . COVID-19   . Diabetes mellitus, type II (HCC)   . DVT (deep venous thrombosis) (HCC) 2014   left leg  . Family history of breast cancer   . Gout   . Heart failure (HCC)   . HTN (hypertension)   . 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 Menorrhagia    secondary to uterine fibroids  . OSA (obstructive sleep apnea)    07/25/13 HST AHI 33/hr, severe hypoxemia O2 min 42% and 95% of the time <89%  . PE (pulmonary embolism) 2014   bilateral  . Prediabetes   . Pulmonary artery hypertension (HCC)   . Pulmonary nodule    (5mm on loeft lower lobe) found on CT scan July 2014, repeat scan Jan 2015 showed less than 4mm  . Right ovarian cyst    noted 09/2012   . S/P TAH (total abdominal hysterectomy) 06/07/2013  . Sleep apnea   . SOB (shortness of breath) on exertion   . Stomach ulcer   . Trichomoniasis    05/2011   . Vitamin D  deficiency      Family History  Problem Relation Age of Onset  . Hypertension Mother   . Kidney disease Mother   . Heart disease Mother   . Alcoholism Mother   . Hypertension Father   . Heart disease Father   .  Stroke Sister   . Hypertension Brother   . Breast cancer Maternal Grandmother        died at 29  . Breast cancer Paternal Grandmother   . Cancer Maternal Aunt        unknown form  . Breast cancer Paternal Aunt   . Breast cancer Paternal Aunt   . Breast cancer Cousin        pat first cousin dx in her 30s  . Colon cancer Other 30       MGMs brother  . Cancer Other        breast ca in [place]  . Cancer Other        g uncle colon or stomach ca     Current Outpatient Medications:  .  acetaminophen  (TYLENOL ) 500 MG tablet, Take 1,000 mg by mouth every 6 (six) hours as needed for moderate pain or headache. , Disp: , Rfl:  .  albuterol  (ACCUNEB ) 1.25 MG/3ML nebulizer solution, Take 3 mLs (1.25 mg total) by nebulization every 6 (six) hours as needed for wheezing., Disp: 360 mL, Rfl: 12 .  albuterol  (PROAIR  HFA) 108 (90 Base) MCG/ACT inhaler, Inhale 1-2 puffs into  the lungs every 6 (six) hours as needed for wheezing or shortness of breath., Disp: 18 g, Rfl: 11 .  Azelastine  HCl 137 MCG/SPRAY SOLN, PLACE 1 SPRAY INTO BOTH NOSTRILS 2 (TWO) TIMES DAILY. USE IN EACH NOSTRIL AS DIRECTED, Disp: 30 mL, Rfl: 4 .  chlorpheniramine-HYDROcodone  (TUSSIONEX) 10-8 MG/5ML, Take 5 mLs by mouth every 12 (twelve) hours as needed for cough., Disp: 115 mL, Rfl: 0 .  clotrimazole -betamethasone  (LOTRISONE ) cream, Apply 1 application topically 2 (two) times daily. Prn leg dermatitis, Disp: 45 g, Rfl: 0 .  Colchicine  (MITIGARE ) 0.6 MG CAPS, Take 1 capsule by mouth as needed. As needed gout flare max # of pills 2 in 24 hours, Disp: 30 capsule, Rfl: 11 .  fluticasone  (FLONASE ) 50 MCG/ACT nasal spray, Place into both nostrils daily as needed for allergies or rhinitis., Disp: , Rfl:  .  Fluticasone -Salmeterol (ADVAIR  HFA IN), Inhale 1 puff into the lungs as needed., Disp: , Rfl:  .  ibuprofen  (ADVIL ) 800 MG tablet, Take 1 tablet (800 mg total) by mouth every 8 (eight) hours as needed., Disp: 90 tablet, Rfl: 1 .  linaclotide   (LINZESS ) 72 MCG capsule, Take 1 capsule (72 mcg total) by mouth daily before breakfast., Disp: 90 capsule, Rfl: 1 .  loratadine  (CLARITIN ) 10 MG tablet, Take 1 tablet (10 mg total) by mouth daily., Disp: 90 tablet, Rfl: 3 .  losartan  (COZAAR ) 25 MG tablet, Take 1 tablet (25 mg total) by mouth daily., Disp: 90 tablet, Rfl: 3 .  Magnesium  Cl-Calcium  Carbonate (SLOW MAGNESIUM /CALCIUM ) 70-117 MG TBEC, Take 2 tablets by mouth in the morning and at bedtime., Disp: , Rfl:  .  metoprolol  tartrate (LOPRESSOR ) 25 MG tablet, TAKE 1 TABLET BY MOUTH TWICE A DAY, Disp: 180 tablet, Rfl: 3 .  Multiple Vitamin (MULTIVITAMIN) tablet, Take 1 tablet by mouth daily., Disp: , Rfl:  .  omeprazole  (PRILOSEC) 40 MG capsule, Take 40 mg by mouth as needed., Disp: , Rfl:  .  OPSUMIT  10 MG TABS, Take 10 mg by mouth daily., Disp: , Rfl: 8 .  ORENITRAM  1 MG TBCR, Take by mouth., Disp: , Rfl:  .  ORENITRAM  5 MG TBCR, Take by mouth., Disp: , Rfl:  .  Polyethyl Glycol-Propyl Glycol (SYSTANE OP), Place 1 drop into both eyes 3 (three) times daily as needed (dry/irritated eyes.)., Disp: , Rfl:  .  potassium chloride  SA (KLOR-CON ) 20 MEQ tablet, Take 1 tablet (20 mEq total) by mouth daily., Disp: , Rfl:  .  Semaglutide , 2 MG/DOSE, (OZEMPIC , 2 MG/DOSE,) 8 MG/3ML SOPN, Inject 2 mg into the skin once a week., Disp: 3 mL, Rfl: 1 .  sildenafil  (REVATIO ) 20 MG tablet, Take 1-2 tablets (20-40 mg total) by mouth 3 (three) times daily. As of 07/03/20 taking 20 mg tid per [facility] pulm Dr. Janie, Disp: 10 tablet, Rfl: 0 .  spironolactone  (ALDACTONE ) 50 MG tablet, Take 1 tablet (50 mg total) by mouth daily., Disp: 90 tablet, Rfl: 3 .  torsemide  (DEMADEX ) 20 MG tablet, Take 20 mg by mouth daily as needed., Disp: , Rfl:  .  treprostinil  (REMODULIN ) 20 MG/20ML injection, 20 ng/kg/min by Continuous infusion (non-IV) route continuous., Disp: , Rfl:  .  triamcinolone  cream (KENALOG ) 0.1 %, Apply 1 application topically daily as needed (leg discoloration).,  Disp: 454 g, Rfl: 2 .  Vitamin D , Ergocalciferol , (DRISDOL ) 1.25 MG (50000 UNIT) CAPS capsule, Take 1 capsule (50,000 Units total) by mouth every 7 (seven) days. *Need office visit for refills*, Disp: 5 capsule, Rfl:  0 .  WINREVAIR 2 x 45 MG subcutaneous injection, Inject into the skin., Disp: , Rfl:  .  rivaroxaban  (XARELTO ) 20 MG TABS tablet, Take 20 mg by mouth daily with supper. (Patient not taking: Reported on 10/06/2023), Disp: , Rfl:  No current facility-administered medications for this visit.  Facility-Administered Medications Ordered in Other Visits:  .  heparin  lock flush 100 unit/mL, 500 Units, Intracatheter, Once PRN, Gudena, Vinay, MD .  sodium chloride  flush (NS) 0.9 % injection 10 mL, 10 mL, Intracatheter, PRN, Gudena, Vinay, MD   Allergies  Allergen Reactions  . Ampicillin Other (See Comments)    Severe abdominal pain, dizziness (penicillin is okay)  . Amoxicillin Other (See Comments)    LIGHT-HEADED and CLOSE TO PASSING OUT  AND ABDOMINAL PAIN Has patient had a PCN reaction causing immediate rash, facial/tongue/throat swelling, SOB or lightheadedness with hypotension: No Has patient had a PCN reaction causing severe rash involving mucus membranes or skin necrosis: No Has patient had a PCN reaction that required hospitalization No Has patient had a PCN reaction occurring within the last 10 years: No If all of the above answers are NO, then may proceed with Cephalosporin use.  . Bismuth -Containing Compounds   . Indomethacin  Other (See Comments)    Gastro irritation  . Nsaids     GI ulcers  . Penicillins   . Sulfasalazine   . Metronidazole  Nausea And Vomiting  . Sulfa Antibiotics Other (See Comments) and Rash    Very bad yeast infection     Review of Systems  Constitutional: Negative.   Respiratory: Negative.    Cardiovascular: Negative.   Neurological: Negative.   Psychiatric/Behavioral: Negative.       Today's Vitals   10/06/23 1445 10/06/23 1501  BP: (!)  160/80 (!) 160/100  Pulse: 95   Temp: 98.3 F (36.8 C)   SpO2: 98%   Weight: 248 lb 6.4 oz (112.7 kg)   Height: 5' 5 (1.651 m)    Body mass index is 41.34 kg/m.  Wt Readings from Last 3 Encounters:  10/06/23 248 lb 6.4 oz (112.7 kg)  09/15/23 237 lb (107.5 kg)  08/19/23 239 lb (108.4 kg)    BP Readings from Last 3 Encounters:  10/06/23 (!) 160/100  09/15/23 125/83  08/19/23 132/70    Objective:  Physical Exam      Assessment And Plan:  Hypertensive heart disease with chronic diastolic congestive heart failure (HCC)  Type 2 diabetes mellitus with microalbuminuria, without long-term current use of insulin  (HCC)     Return if symptoms worsen or fail to improve.  Patient was given opportunity to ask questions. Patient verbalized understanding of the plan and was able to repeat key elements of the plan. All questions were answered to their satisfaction.  Tiffany LOISE Slocumb, MD  I, Tiffany LOISE Slocumb, MD, have reviewed all documentation for this visit. The documentation on 10/06/23 for the exam, diagnosis, procedures, and orders are all accurate and complete.   IF YOU HAVE BEEN REFERRED TO A SPECIALIST, IT MAY TAKE 1-2 WEEKS TO SCHEDULE/PROCESS THE REFERRAL. IF YOU HAVE NOT HEARD FROM US /SPECIALIST IN TWO WEEKS, PLEASE GIVE US  A CALL AT [phone].   THE PATIENT IS ENCOURAGED TO PRACTICE SOCIAL DISTANCING DUE TO THE COVID-19 PANDEMIC.

## 2023-10-06 NOTE — Patient Instructions (Signed)
 Visit Information  Thank you for taking time to visit with me today. Please don't hesitate to contact me if I can be of assistance to you before our next scheduled appointment.  Our next appointment is by telephone on 07/16 at 10 AM Please call the care guide team at [phone] if you need to cancel or reschedule your appointment.   Following is a copy of your care plan:   Goals Addressed             This Visit's Progress    LCSW [facility] Social Work Care Plan   On track    Problems:   Disease Management support and education needs related to Depression: disturbed sleep  CSW Clinical Goal(s):   Over the next 90 days the Patient will attend all scheduled medical appointments as evidenced by patient report and care team review of appointment completion in electronic MEDICAL RECORD NUMBER  demonstrate a reduction in symptoms related to Depression: disturbed sleep .  Interventions:  Mental Health:  Evaluation of current treatment plan related to Depression: disturbed sleep Active listening / Reflection utilized Caregiver stress acknowledged :  Financial risk analyst / information provided Industrial/product designer Provided Mindfulness or Relaxation training provided Participation in counseling encourage : Patient is open to counseling outside of the [facility] with virtual options Provided general psycho-education for mental health needs Quality of sleep assessed & Sleep Hygiene techniques promoted Solution-Focued Strategies employed:  Patient Goals/Self-Care Activities:  Continue taking your medication as prescribed.   Increase coping skills, healthy habits, and stress reduction  Plan:   Telephone follow up appointment with care management team member scheduled for:  1 week        Please call the Suicide and Crisis Lifeline: 988 go to [facility] [address] [place] [phone]) call 911 if you are experiencing a Mental Health or  Behavioral Health Crisis or need someone to talk to.  Patient verbalizes understanding of instructions and care plan provided today and agrees to view in MyChart. Active MyChart status and patient understanding of how to access instructions and care plan via MyChart confirmed with patient.     Rolin Ezzard HUG[facility]  [facility], [facility] Clinical Social Worker Direct Dial: [phone]  Fax: [phone] Website: [email or web address] 3:06 PM

## 2023-10-07 ENCOUNTER — Ambulatory Visit: Payer: Self-pay | Admitting: Internal Medicine

## 2023-10-07 NOTE — Assessment & Plan Note (Addendum)
 Chronic, followed at [facility].  Most recent specialist notes reviewed in Care Everywhere - Continue with oral treprostinil 

## 2023-10-07 NOTE — Assessment & Plan Note (Signed)
 Chronic, stable hba1c. She will continue with Ozempic .  - Importance of adequate protein intake and participation in strength training exercises to avoid muscle atrophy was discussed with the patient.

## 2023-10-07 NOTE — Assessment & Plan Note (Signed)
 Chronic, last gout attack was 3-4 months ago, treated with colchicine . Believes attack was medication-induced. - Order uric acid level in two weeks during nurse visit.

## 2023-10-07 NOTE — Assessment & Plan Note (Addendum)
 Chronic, uncontrolled. Likely exacerbated by stressors. Blood pressure elevated at 160/100 mmHg. Increased stress may contribute. Currently on losartan , considering dose increase due to suboptimal control. Normal kidney function with GFR of 90. - Increase losartan  dose to two 25mg  tablets daily, for a total of 50mg . - Continue with metoprolol  - Would likely benefit from 50mg  spironolactone , will hold off due to previous issues. - Schedule nurse visit in two weeks to recheck blood pressure and kidney function. - Monitor for dizziness and lightheadedness. - Follow low sodium diet.

## 2023-10-12 ENCOUNTER — Encounter: Payer: Self-pay | Admitting: Nurse Practitioner

## 2023-10-12 ENCOUNTER — Telehealth (INDEPENDENT_AMBULATORY_CARE_PROVIDER_SITE_OTHER): Payer: Self-pay | Admitting: Physician Assistant

## 2023-10-12 ENCOUNTER — Other Ambulatory Visit (INDEPENDENT_AMBULATORY_CARE_PROVIDER_SITE_OTHER): Payer: Self-pay | Admitting: Physician Assistant

## 2023-10-12 ENCOUNTER — Other Ambulatory Visit (HOSPITAL_COMMUNITY): Payer: Self-pay

## 2023-10-12 DIAGNOSIS — E1129 Type 2 diabetes mellitus with other diabetic kidney complication: Secondary | ICD-10-CM

## 2023-10-12 MED ORDER — OZEMPIC (2 MG/DOSE) 8 MG/3ML ~~LOC~~ SOPN
2.0000 mg | PEN_INJECTOR | SUBCUTANEOUS | 1 refills | Status: DC
  Filled 2023-10-12 – 2023-10-25 (×2): qty 3, 28d supply, fill #0

## 2023-10-12 NOTE — Telephone Encounter (Signed)
 Patient called to cr/s her 07/16 appt due to being out of town. Patient states that she needs a refill of Ozempic .

## 2023-10-13 ENCOUNTER — Ambulatory Visit (INDEPENDENT_AMBULATORY_CARE_PROVIDER_SITE_OTHER): Admitting: Physician Assistant

## 2023-10-13 ENCOUNTER — Other Ambulatory Visit: Payer: Self-pay | Admitting: Licensed Clinical Social Worker

## 2023-10-18 ENCOUNTER — Other Ambulatory Visit: Payer: Self-pay

## 2023-10-18 DIAGNOSIS — I110 Hypertensive heart disease with heart failure: Secondary | ICD-10-CM

## 2023-10-18 DIAGNOSIS — I272 Pulmonary hypertension, unspecified: Secondary | ICD-10-CM

## 2023-10-18 DIAGNOSIS — Z794 Long term (current) use of insulin: Secondary | ICD-10-CM

## 2023-10-18 NOTE — Patient Instructions (Signed)
 Visit Information  Thank you for taking time to visit with me today. Please don't hesitate to contact me if I can be of assistance to you before our next scheduled appointment.  Our next appointment is by telephone on Monday, August 18 at 11:15 AM Please call the care guide team at [phone] if you need to cancel or reschedule your appointment.   Following is a copy of your care plan:   Goals Addressed             This Visit's Progress    VBCI RN Care Plan related to Hypertension heart disease with chronic diastolic congestive heart failure       Problems:  Chronic Disease Management support and education needs related to Hypertensive heart disease with chronic diastolic congestive heart failure   Goal: Over the next 120 days the Patient will continue to work with RN Care Manager and/or Social Worker to address care management and care coordination needs related to Hypertensive heart disease with chronic diastolic congestive heart failure as evidenced by adherence to care management team scheduled appointments      Interventions:   Hypertension Interventions: Last practice recorded BP readings:  BP Readings from Last 3 Encounters:  10/06/23 (!) 160/100  09/15/23 125/83  08/19/23 132/70   Most recent eGFR/CrCl:  Lab Results  Component Value Date   EGFR 87 04/13/2023    No components found for: CRCL  Evaluation of current treatment plan related to hypertension self management and patient's adherence to plan as established by provider Reviewed medications with patient and discussed importance of compliance Counseled on the importance of exercise goals with target of 150 minutes per week Advised patient, providing education and rationale, to monitor blood pressure daily and record, calling PCP for findings outside established parameters Provided education on prescribed diet low Sodium Discussed complications of poorly controlled blood pressure such as heart disease, and  kidney impairment   Heart Failure Interventions: Basic overview and discussion of pathophysiology of Heart Failure reviewed Provided education on low sodium diet Reviewed Heart Failure Action Plan in depth and provided written copy Assessed need for readable accurate scales in home Provided education about placing scale on hard, flat surface Advised patient to weigh each morning after emptying bladder Discussed importance of daily weight and advised patient to weigh and record daily Reviewed role of diuretics in prevention of fluid overload and management of heart failure; Provided patient with education about the role of exercise in the management of heart failure   Patient Self-Care Activities:  Attend all scheduled provider appointments Call pharmacy for medication refills 3-7 days in advance of running out of medications Call provider office for new concerns or questions  Take medications as prescribed   Work with the pharmacist to address medication management needs and will continue to work with the clinical team to address health care and disease management related needs call office if I gain more than 2 pounds in one day or 5 pounds in one week use salt in moderation watch for swelling in feet, ankles and legs every day weigh myself daily develop a rescue plan follow rescue plan if symptoms flare-up track symptoms and what helps feel better or worse check blood pressure 3 times per week keep a blood pressure log take blood pressure log to all doctor appointments call doctor for signs and symptoms of high blood pressure take medications for blood pressure exactly as prescribed begin an exercise program report new symptoms to your doctor  Plan:  Next PCP appointment scheduled for: 10/19/23 at 11:00 AM Telephone follow up appointment with care management team member scheduled for: Monday, August 18 at 11:15 AM             Please call 1-800-273-TALK (toll free, 24  hour hotline) if you are experiencing a Mental Health or Behavioral Health Crisis or need someone to talk to.  Patient verbalizes understanding of instructions and care plan provided today and agrees to view in MyChart. Active MyChart status and patient understanding of how to access instructions and care plan via MyChart confirmed with patient.     Clayborne Ly RN BSN CCM [place]  [facility], [facility] Nurse Care Coordinator  Direct Dial: [phone] Website: [email or web address]

## 2023-10-18 NOTE — Patient Outreach (Signed)
 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 I obtained verbal consent from Patient.  Visit completed with patient on the phone.  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   Menorrhagia    secondary to uterine fibroids   OSA (obstructive sleep apnea)    07/25/13 HST AHI 33/hr, severe hypoxemia O2 min 42% and 95% of the time <89%   PE (pulmonary embolism) 2014   bilateral   Prediabetes    Pulmonary artery hypertension (HCC)    Pulmonary nodule    (5mm on loeft lower lobe) found on CT scan July 2014, repeat scan Jan 2015 showed less than 4mm   Right ovarian cyst    noted 09/2012    S/P TAH (total abdominal hysterectomy) 06/07/2013   Sleep apnea    SOB (shortness of breath) on exertion    Stomach ulcer    Trichomoniasis    05/2011    Vitamin D  deficiency     Assessment: Patient Reported Symptoms:  Cognitive Cognitive Status: Alert and oriented to person, place, and time Cognitive/Intellectual Conditions Management [RPT]: None reported or documented in medical history or problem list   Health Maintenance Behaviors: Annual physical exam  Neurological Neurological Review of Symptoms: No symptoms reported    HEENT HEENT Symptoms  Reported: Eye dryness HEENT Management Strategies: Medication therapy HEENT Self-Management Outcome: 4 (good)    Cardiovascular Cardiovascular Symptoms Reported: Swelling in legs or feet Does patient have uncontrolled Hypertension?: Yes Patient's Recent BP reading at home: patient is not taking BP on a regular basis due to this causes her anxiety Cardiovascular Management Strategies: Medication therapy, Routine screening Cardiovascular Self-Management Outcome: 3 (uncertain) Cardiovascular Comment: patient would like to speak with a pharmacist regarding BP meds  Respiratory Respiratory Symptoms Reported: Shortness of breath Respiratory Management Strategies: BiPAP, Oxygen  therapy Respiratory Self-Management Outcome: 4 (good)  Endocrine Endocrine Symptoms Reported: No symptoms reported Is patient diabetic?: Yes Is patient checking blood sugars at home?: No Endocrine Self-Management Outcome: 4 (good)  Gastrointestinal Gastrointestinal Symptoms Reported: Constipation, Other Other Gastrointestinal Symptoms: lose stools w/magnesium  Gastrointestinal Management Strategies: Medication therapy (routine screening) Gastrointestinal Self-Management Outcome: 4 (good)    Genitourinary Genitourinary Symptoms Reported: Frequency Genitourinary Management Strategies: Fluid modification Genitourinary Self-Management Outcome: 4 (good) Genitourinary Comment: history of UTI's  Integumentary Integumentary Symptoms Reported: Itching, Rash, Skin changes Additional Integumentary Details: skin discoloration Skin Management Strategies: Medication therapy, Routine screening  Musculoskeletal Musculoskelatal Symptoms Reviewed: No symptoms reported   Falls in the past year?: No Number of falls in past year: 1 or less Was there an injury with Fall?: No Fall Risk Category Calculator: 0 Patient Fall Risk Level: Low Fall Risk Patient at Risk for Falls Due to: No Fall  Risks Fall risk Follow up: Falls evaluation  completed  Psychosocial Psychosocial Symptoms Reported: Other (patient is established with Rolin Kerns LCSW) Behavioral Management Strategies: Coping strategies, Support system Behavioral Health Self-Management Outcome: 4 (good) Major Change/Loss/Stressor/Fears (CP): Medical condition, family, Medical condition, self Quality of Family Relationships: involved, helpful, supportive Do you feel physically threatened by others?: No      10/18/2023   12:11 PM  Depression screen PHQ 2/9  Decreased Interest 0  Down, Depressed, Hopeless 0  PHQ - 2 Score 0    There were no vitals filed for this visit.  Medications Reviewed Today     Reviewed by Morgan Clayborne CROME, RN (Registered Nurse) on 10/18/23 at 1149  Med List Status: <None>   Medication Order Taking? Sig Documenting Provider Last Dose Status Informant  acetaminophen  (TYLENOL ) 500 MG tablet [id number] Yes Take 1,000 mg by mouth every 6 (six) hours as needed for moderate pain or headache.  [provider]  Active Self  albuterol  (ACCUNEB ) 1.25 MG/3ML nebulizer solution [id number] Yes Take 3 mLs (1.25 mg total) by nebulization every 6 (six) hours as needed for wheezing. Georgina Speaks, FNP  Active   albuterol  (PROAIR  HFA) 108 [phone] Base) MCG/ACT inhaler [id number] Yes Inhale 1-2 puffs into the lungs every 6 (six) hours as needed for wheezing or shortness of breath. McLean-Scocuzza, Randine SAILOR, MD  Active   Azelastine  HCl 137 MCG/SPRAY SOLN [id number] Yes PLACE 1 SPRAY INTO BOTH NOSTRILS 2 (TWO) TIMES DAILY. USE IN EACH NOSTRIL AS DIRECTED Webb, Padonda B, FNP  Active   chlorpheniramine-HYDROcodone  (TUSSIONEX) 10-8 MG/5ML [id number] Yes Take 5 mLs by mouth every 12 (twelve) hours as needed for cough. Georgina Speaks, FNP  Active   clotrimazole -betamethasone  (LOTRISONE ) cream [id number]  Apply 1 application topically 2 (two) times daily. Prn leg dermatitis  Patient not taking: Reported on 10/18/2023   McLean-Scocuzza, Randine SAILOR, MD  Active   Colchicine   (MITIGARE ) 0.6 MG CAPS [id number] Yes Take 1 capsule by mouth as needed. As needed gout flare max # of pills 2 in 24 hours McLean-Scocuzza, Randine SAILOR, MD  Active   fluticasone  (FLONASE ) 50 MCG/ACT nasal spray [id number] Yes Place into both nostrils daily as needed for allergies or rhinitis. [provider]  Active Self  Fluticasone -Salmeterol (ADVAIR  HFA IN) [id number] Yes Inhale 1 puff into the lungs as needed. [provider]  Active   heparin  lock flush 100 unit/mL [id number]   Odean Potts, MD  Active   ibuprofen  (ADVIL ) 800 MG tablet [id number] Yes Take 1 tablet (800 mg total) by mouth every 8 (eight) hours as needed. McLean-Scocuzza, Randine SAILOR, MD  Active   linaclotide  (LINZESS ) 72 MCG capsule [id number] Yes Take 1 capsule (72 mcg total) by mouth daily before breakfast. Georgina Speaks, FNP  Active   loratadine  (CLARITIN ) 10 MG tablet [id number] Yes Take 1 tablet (10 mg total) by mouth daily. McLean-Scocuzza, Randine SAILOR, MD  Active   losartan  (COZAAR ) 25 MG tablet [id number] Yes Take 1 tablet (25 mg total) by mouth daily. Bensimhon, Daniel R, MD  Active   Magnesium  Cl-Calcium  Carbonate (SLOW MAGNESIUM /CALCIUM ) 70-117 MG TBEC [id number] Yes Take 2 tablets by mouth in the morning and at bedtime.  Patient taking differently: Take 2 tablets by mouth as needed.   Gudena, Vinay, MD  Active Self  metoprolol  tartrate (LOPRESSOR ) 25 MG tablet [id number] Yes TAKE 1 TABLET BY MOUTH TWICE A DAY McLean-Scocuzza, Randine SAILOR, MD  Active   Multiple Vitamin (MULTIVITAMIN) tablet [id number] Yes Take 1 tablet by  mouth daily. [provider]  Active Self  omeprazole  (PRILOSEC) 40 MG capsule [id number] Yes Take 40 mg by mouth as needed. [provider]  Active   OPSUMIT  10 MG TABS [id number] Yes Take 10 mg by mouth daily. [provider]  Active Self  ORENITRAM  1 MG TBCR [id number] Yes Take by mouth. [provider]  Active   ORENITRAM  5 MG TBCR [id number] Yes Take by mouth. [provider]  Active   Polyethyl Glycol-Propyl Glycol (SYSTANE OP) [id number] Yes Place 1 drop into both eyes 3 (three) times daily as needed (dry/irritated eyes.). [provider]  Active Self  potassium chloride  SA (KLOR-CON ) 20 MEQ tablet [id number] Yes Take 1 tablet (20 mEq total) by mouth daily.  Patient taking differently: Take 20 mEq by mouth as needed.   Gudena, Vinay, MD  Active Self    Discontinued 08/17/16 [phone]   rivaroxaban  (XARELTO ) 20 MG TABS tablet [id number]  Take 20 mg by mouth daily with supper.  Patient not taking: Reported on 10/18/2023   [provider]  Active Self  Semaglutide , 2 MG/DOSE, (OZEMPIC , 2 MG/DOSE,) 8 MG/3ML SOPN [id number] Yes Inject 2 mg into the skin once a week. Rayburn, Elouise Phlegm, PA-C  Active   sildenafil  (REVATIO ) 20 MG tablet [id number] Yes Take 1-2 tablets (20-40 mg total) by mouth 3 (three) times daily. As of 07/03/20 taking 20 mg tid per [facility] pulm Dr. LEvarge McLean-Scocuzza, Randine SAILOR, MD  Active   sodium chloride  flush (NS) 0.9 % injection 10 mL [id number]   Odean Potts, MD  Active   spironolactone  (ALDACTONE ) 50 MG tablet [id number] Yes Take 1 tablet (50 mg total) by mouth daily. McLean-Scocuzza, Randine SAILOR, MD  Active   torsemide  (DEMADEX ) 20 MG tablet [id number] Yes Take 20 mg by mouth daily as needed. [provider]  Active   treprostinil  (REMODULIN ) 20 MG/20ML injection [id number]  20 ng/kg/min by Continuous infusion (non-IV) route continuous.  Patient not taking: Reported on 10/18/2023   [provider]  Active Self  triamcinolone  cream (KENALOG ) 0.1 % [id number] Yes Apply 1 application topically daily as needed (leg discoloration). McLean-Scocuzza, Randine SAILOR, MD  Active Self  Vitamin D , Ergocalciferol , (DRISDOL ) 1.25 MG (50000 UNIT) CAPS capsule [id number] Yes Take 1 capsule (50,000 Units total) by mouth every 7 (seven) days. *Need office visit for refills* Rayburn, Elouise Phlegm, PA-C  Active   WINREVAIR 2 x 45 MG  subcutaneous injection [id number]  Inject into the skin.  Patient not taking: Reported on 10/18/2023   [provider]  Active             Recommendation:   PCP Follow-up for BP check and lab visit scheduled for 10/19/23 at 11:00 Am/11:20 AM  Follow Up Plan:   Telephone follow up appointment date/time:  Monday, August 15 at 11:15 AM Referral to Pharmacist  Clayborne Ly RN BSN CCM [facility]  [facility], [facility] Nurse Care Coordinator  Direct Dial: [phone] Website: [email or web address]

## 2023-10-19 ENCOUNTER — Other Ambulatory Visit: Payer: Self-pay

## 2023-10-19 ENCOUNTER — Ambulatory Visit

## 2023-10-19 ENCOUNTER — Other Ambulatory Visit: Payer: Self-pay | Admitting: Nurse Practitioner

## 2023-10-19 VITALS — BP 110/82 | HR 121 | Temp 98.2°F | Ht 65.0 in | Wt 248.0 lb

## 2023-10-19 DIAGNOSIS — E1129 Type 2 diabetes mellitus with other diabetic kidney complication: Secondary | ICD-10-CM

## 2023-10-19 DIAGNOSIS — I110 Hypertensive heart disease with heart failure: Secondary | ICD-10-CM

## 2023-10-19 DIAGNOSIS — M1A9XX0 Chronic gout, unspecified, without tophus (tophi): Secondary | ICD-10-CM

## 2023-10-19 DIAGNOSIS — R000 Tachycardia, unspecified: Secondary | ICD-10-CM

## 2023-10-19 NOTE — Progress Notes (Signed)
 Patient presents today for a bpc. Patient reports compliance with her meds. She reports she takes her losartan  25mg  in the mornings and metoprolol  25mg  in the evenings. I checked patient's blood pressure and it was 110/82 P121. Due to patient's heart rate we have done an EKG. Patient was advised to continue her blood pressure medications and she was advised to take metoprolol  25mg  1/2 tablet when she gets home and take her regular dose at her scheduled dosing time. Patient is to also reach out to her cardiologist to schedule an appointment sooner than August. Patient will return to office in 2 weeks for a NV to recheck her heart rate. YL,RMA   BP Readings from Last 3 Encounters:  10/06/23 (!) 160/100  09/15/23 125/83  08/19/23 132/70

## 2023-10-20 ENCOUNTER — Ambulatory Visit: Payer: Self-pay | Admitting: Internal Medicine

## 2023-10-20 LAB — BMP8+EGFR
BUN/Creatinine Ratio: 19 (ref 9–23)
BUN: 19 mg/dL (ref 6–24)
CO2: 20 mmol/L (ref 20–29)
Calcium: 9.1 mg/dL (ref 8.7–10.2)
Chloride: 103 mmol/L (ref 96–106)
Creatinine, Ser: 1 mg/dL (ref 0.57–1.00)
Glucose: 113 mg/dL — ABNORMAL HIGH (ref 70–99)
Potassium: 4.3 mmol/L (ref 3.5–5.2)
Sodium: 141 mmol/L (ref 134–144)
eGFR: 66 mL/min/1.73 (ref >59)

## 2023-10-20 LAB — LIPID PANEL
Chol/HDL Ratio: 5.8 ratio — ABNORMAL HIGH (ref 0.0–4.4)
Cholesterol, Total: 248 mg/dL — ABNORMAL HIGH (ref 100–199)
HDL: 43 mg/dL (ref >39)
LDL Chol Calc (NIH): 164 mg/dL — ABNORMAL HIGH (ref 0–99)
Triglycerides: 220 mg/dL — ABNORMAL HIGH (ref 0–149)
VLDL Cholesterol Cal: 41 mg/dL — ABNORMAL HIGH (ref 5–40)

## 2023-10-20 LAB — HEMOGLOBIN A1C
Est. average glucose Bld gHb Est-mCnc: 105 mg/dL
Hgb A1c MFr Bld: 5.3 % (ref 4.8–5.6)

## 2023-10-20 LAB — URIC ACID: Uric Acid: 10 mg/dL — ABNORMAL HIGH (ref 3.0–7.2)

## 2023-10-21 ENCOUNTER — Other Ambulatory Visit (HOSPITAL_COMMUNITY): Payer: Self-pay

## 2023-10-21 ENCOUNTER — Telehealth: Payer: Self-pay | Admitting: Pharmacist

## 2023-10-21 DIAGNOSIS — I5032 Chronic diastolic (congestive) heart failure: Secondary | ICD-10-CM

## 2023-10-21 NOTE — Progress Notes (Signed)
 10/21/2023  Patient ID: [name], female   DOB: 1966-02-11, 58 y.o.   MRN: [id number]  Patient was called per referral to answer questions about blood pressure medications. HIPAA identifiers were obtained.   The patient is a 58 year old female referred for pharmacy services due to questions regarding her blood pressure medications and diuretics. Her past medical history is significant for, but not limited to, a history of breast cancer, chemotherapy-induced peripheral neuropathy, chronic diastolic congestive heart failure, chronic pulmonary embolism, cirrhosis of the liver with ascites, peripheral arterial disease, pulmonary arterial hypertension, obesity, obstructive sleep apnea (on CPAP), hypertension, hyperlipidemia, history of deep vein thrombosis (DVT), and vitamin D  deficiency.  The patient's medications were reviewed with her over the phone. She is currently being followed by [facility] for pulmonary hypertension and is on multiple medications related to that condition. During the conversation, the patient inquired about substituting hydrochlorothiazide (HCTZ) for one of her current antihypertensives, specifically Cozaar  (losartan ). She was informed that HCTZ does not offer the same renal or cardiovascular protective benefits as losartan . She also asked whether HCTZ could replace metoprolol  tartrate, unfortunately substituting HCTZ would not be appropriate as it does not provide the same heart rate-lowering benefit.  Medications Reviewed Today     Reviewed by Jolee Cassius PARAS, [facility] (Pharmacist) on 10/21/23 at 1406  Med List Status: <None>   Medication Order Taking? Sig Documenting Provider Last Dose Status Informant  acetaminophen  (TYLENOL ) 500 MG tablet [id number] Yes Take 1,000 mg by mouth every 6 (six) hours as needed for moderate pain or headache.  [provider]  Active Self  albuterol  (ACCUNEB ) 1.25 MG/3ML nebulizer solution [id number] Yes Take 3 mLs (1.25 mg total) by  nebulization every 6 (six) hours as needed for wheezing. Georgina Speaks, FNP  Active   albuterol  (PROAIR  HFA) 108 [id number] Base) MCG/ACT inhaler [id number] Yes Inhale 1-2 puffs into the lungs every 6 (six) hours as needed for wheezing or shortness of breath. McLean-Scocuzza, Randine SAILOR, MD  Active   Azelastine  HCl 137 MCG/SPRAY SOLN [id number] Yes PLACE 1 SPRAY INTO BOTH NOSTRILS 2 (TWO) TIMES DAILY. USE IN EACH NOSTRIL AS DIRECTED Webb, Padonda B, FNP  Active   chlorpheniramine-HYDROcodone  (TUSSIONEX) 10-8 MG/5ML [id number] Yes Take 5 mLs by mouth every 12 (twelve) hours as needed for cough. Georgina Speaks, FNP  Active   clotrimazole -betamethasone  (LOTRISONE ) cream [id number]  Apply 1 application topically 2 (two) times daily. Prn leg dermatitis  Patient not taking: Reported on 10/21/2023   McLean-Scocuzza, Randine SAILOR, MD  Active   Colchicine  (MITIGARE ) 0.6 MG CAPS [id number] Yes Take 1 capsule by mouth as needed. As needed gout flare max # of pills 2 in 24 hours McLean-Scocuzza, Randine SAILOR, MD  Active   fluticasone  (FLONASE ) 50 MCG/ACT nasal spray [id number] Yes Place into both nostrils daily as needed for allergies or rhinitis. [provider]  Active Self  Fluticasone -Salmeterol (ADVAIR  HFA IN) [id number] Yes Inhale 1 puff into the lungs as needed. [provider]  Active   heparin  lock flush 100 unit/mL [id number]   Odean Potts, MD  Active   ibuprofen  (ADVIL ) 800 MG tablet [id number] Yes Take 1 tablet (800 mg total) by mouth every 8 (eight) hours as needed. McLean-Scocuzza, Randine SAILOR, MD  Active   linaclotide  (LINZESS ) 72 MCG capsule [id number] Yes Take 1 capsule (72 mcg total) by mouth daily before breakfast. Georgina Speaks, FNP  Active   loratadine  (CLARITIN ) 10 MG tablet [id number] Yes Take 1 tablet (10 mg total) by mouth daily.  McLean-Scocuzza, Randine SAILOR, MD  Active   losartan  (COZAAR ) 25 MG tablet [id number] Yes Take 1 tablet (25 mg total) by mouth daily. Bensimhon, Toribio SAUNDERS, MD  Active   Magnesium   Cl-Calcium  Carbonate (SLOW MAGNESIUM /CALCIUM ) 70-117 MG TBEC [id number]  Take 2 tablets by mouth in the morning and at bedtime.  Patient taking differently: Take 2 tablets by mouth as needed.   Gudena, Vinay, MD  Active Self  metoprolol  tartrate (LOPRESSOR ) 25 MG tablet [id number] Yes TAKE 1 TABLET BY MOUTH TWICE A DAY McLean-Scocuzza, Randine SAILOR, MD  Active   Multiple Vitamin (MULTIVITAMIN) tablet [id number] Yes Take 1 tablet by mouth daily. [provider]  Active Self  omeprazole  (PRILOSEC) 40 MG capsule [id number] Yes Take 40 mg by mouth as needed. [provider]  Active   OPSUMIT  10 MG TABS [id number] Yes Take 10 mg by mouth daily. [provider]  Active Self  ORENITRAM  0.25 MG TBCR [id number] Yes Take 0.5 mg by mouth.  Take 2 tablets (0.5 mg total) by mouth every eight (8) hours. Take along with 5 mg tablet and four 1 mg tablets, total 8.5 mg every 8 hours [provider]  Active   ORENITRAM  1 MG TBCR [id number] Yes Take by mouth. [provider]  Active   ORENITRAM  5 MG TBCR [id number] Yes Take by mouth. [provider]  Active   Polyethyl Glycol-Propyl Glycol (SYSTANE OP) [id number]  Place 1 drop into both eyes 3 (three) times daily as needed (dry/irritated eyes.). [provider]  Active Self  Potassium Chloride  ER 20 MEQ TBCR [id number] Yes Take 1 tablet by mouth 2 (two) times daily. [provider]  Active   potassium chloride  SA (KLOR-CON ) 20 MEQ tablet [id number]  Take 1 tablet (20 mEq total) by mouth daily.  Patient taking differently: Take 20 mEq by mouth as needed.   Gudena, Vinay, MD  Active Self    Discontinued 08/17/16 [id number]   rivaroxaban  (XARELTO ) 20 MG TABS tablet [id number]  Take 20 mg by mouth daily with supper.  Patient not taking: Reported on 10/21/2023   [provider]  Active Self  Semaglutide , 2 MG/DOSE, (OZEMPIC , 2 MG/DOSE,) 8 MG/3ML SOPN [id number] Yes Inject 2 mg into the skin once a week. Rayburn,  Elouise Phlegm, PA-C  Active   sildenafil  (REVATIO ) 20 MG tablet [id number] Yes Take 1-2 tablets (20-40 mg total) by mouth 3 (three) times daily. As of 07/03/20 taking 20 mg tid per [facility] pulm Dr. LEvarge McLean-Scocuzza, Randine SAILOR, MD  Active   sodium chloride  flush (NS) 0.9 % injection 10 mL [id number]   Odean Potts, MD  Active   spironolactone  (ALDACTONE ) 50 MG tablet [id number]  Take 1 tablet (50 mg total) by mouth daily.  Patient not taking: Reported on 10/21/2023   McLean-Scocuzza, Randine SAILOR, MD  Active   torsemide  (DEMADEX ) 20 MG tablet [id number] Yes Take 20 mg by mouth daily as needed. [provider]  Active    Patient not taking:   Discontinued 10/21/23 1358 triamcinolone  cream (KENALOG ) 0.1 % [id number] Yes Apply 1 application topically daily as needed (leg discoloration). McLean-Scocuzza, Randine SAILOR, MD  Active Self  Vitamin D , Ergocalciferol , (DRISDOL ) 1.25 MG (50000 UNIT) CAPS capsule [id number] Yes Take 1 capsule (50,000 Units total) by mouth every 7 (seven) days. *Need office visit for refills* Rayburn, Elouise Phlegm, PA-C  Active   WINREVAIR 2 x 45 MG subcutaneous injection [id number] Yes Inject into the skin. [provider]  Active  The potential of adding HCTZ to her current regimen was discussed, although it may not offer significant benefit since she is already prescribed torsemide  on a PRN basis. One consideration discussed was adjusting her torsemide  dose from 20 mg as needed to a lower dose (e.g., 5-10 mg) taken more frequently, such as 2-3 times per week, to help manage fluid without excessive diuresis.       10/19/2023   11:11 AM 10/06/2023    3:01 PM 10/06/2023    2:45 PM  Vitals with BMI  Height 5' 5  5' 5  Weight 248 lbs  248 lbs 6 oz  BMI 41.27  41.34  Systolic 110 160 839  Diastolic 82 100 80  Pulse 121  95   Lab Results  Component Value Date   HGBA1C 5.3 10/19/2023   HGBA1C 5.1 04/13/2023   HGBA1C 5.2 11/12/2022   Lipid Panel      Component Value Date/Time   CHOL 248 (H) 10/19/2023 1112   TRIG 220 (H) 10/19/2023 1112   HDL 43 10/19/2023 1112   CHOLHDL 5.8 (H) 10/19/2023 1112   CHOLHDL 4 07/03/2021 1138   VLDL 23.6 07/03/2021 1138   LDLCALC 164 (H) 10/19/2023 1112   LABVLDL 41 (H) 10/19/2023 1112   The 10-year ASCVD risk score (Arnett DK, et al., 2019) is: 11.9%   Values used to calculate the score:     Age: 56 years     Clincally relevant sex: Female     Is Non-Hispanic African American: Yes     Diabetic: Yes     Tobacco smoker: No     Systolic Blood Pressure: 110 mmHg     Is BP treated: Yes     HDL Cholesterol: 43 mg/dL     Total Cholesterol: 248 mg/dL     A significant amount of time was spent educating the patient about the purpose of each of her medications. Of note, she is also prescribed spironolactone  and potassium PRN. She has a history of hypokalemia.  Her most recent potassium level on 7/22 was 4.3.  The patient will be sent information outlining the differences between hydrochlorothiazide and torsemide . She will also be provided with educational materials comparing Zepbound , Ozempic , and Mounjaro  per her request.  Plan: Follow up with Patient and her Provider about lipid panel. Check back in with the patient in 2-3 weeks.   Cassius DOROTHA Brought, PharmD, BCACP Clinical Pharmacist [phone]

## 2023-10-22 NOTE — Patient Outreach (Signed)
 Complex Care Management   Visit Note  10/13/2023  Name:  [name] MRN: [id number] DOB: Feb 10, 1966  Situation: Referral received for Complex Care Management related to Mental/Behavioral Health diagnosis Depression I obtained verbal consent from Patient.  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   Menorrhagia    secondary to uterine fibroids   OSA (obstructive sleep apnea)    07/25/13 HST AHI 33/hr, severe hypoxemia O2 min 42% and 95% of the time <89%   PE (pulmonary embolism) 2014   bilateral   Prediabetes    Pulmonary artery hypertension (HCC)    Pulmonary nodule    (5mm on loeft lower lobe) found on CT scan July 2014, repeat scan Jan 2015 showed less than 4mm   Right ovarian cyst    noted 09/2012    S/P TAH (total abdominal hysterectomy) 06/07/2013   Sleep apnea    SOB (shortness of breath) on exertion    Stomach ulcer    Trichomoniasis    05/2011    Vitamin D  deficiency     Assessment: Patient Reported Symptoms:  Cognitive Cognitive Status: Alert and oriented to person, place, and time, Normal speech and language skills Cognitive/Intellectual Conditions Management [RPT]: None reported or documented in medical history or problem list   Health Maintenance Behaviors: Annual physical exam, Stress management  Neurological Neurological Review of Symptoms: Not assessed    HEENT HEENT Symptoms Reported: Not assessed      Cardiovascular Cardiovascular Symptoms Reported: Not assessed    Respiratory  Respiratory Symptoms Reported: Not assesed    Endocrine Endocrine Symptoms Reported: Not assessed    Gastrointestinal Gastrointestinal Symptoms Reported: Not assessed      Genitourinary Genitourinary Symptoms Reported: Not assessed    Integumentary Integumentary Symptoms Reported: Not assessed    Musculoskeletal Musculoskelatal Symptoms Reviewed: Not assessed        Psychosocial Psychosocial Symptoms Reported: Other Other Psychosocial Conditions: Stress Additional Psychological Details: Patient is doing well practicing self-care to assist with symptom management. Behavioral Management Strategies: Coping strategies, Support system Major Change/Loss/Stressor/Fears (CP): Medical condition, self, Medical condition, family Techniques to [place] with Loss/Stress/Change: Diversional activities, Spiritual practice(s)        10/18/2023   12:11 PM  Depression screen PHQ 2/9  Decreased Interest 0  Down, Depressed, Hopeless 0  PHQ - 2 Score 0    There were no vitals filed for this visit.  Medications Reviewed Today     Reviewed by Ezzard Rolin BIRCH, LCSW (Social Worker) on 10/22/23 at [phone]  Med List Status: <None>   Medication Order Taking? Sig Documenting Provider Last Dose Status Informant  acetaminophen  (TYLENOL ) 500 MG tablet [id number]  Take 1,000 mg by mouth every 6 (six) hours as needed for moderate pain or headache.  [provider]  Active Self  albuterol  (ACCUNEB ) 1.25 MG/3ML nebulizer solution [id number]  Take 3 mLs (1.25 mg total) by nebulization every 6 (six)  hours as needed for wheezing. Georgina Speaks, FNP  Active   albuterol  (PROAIR  HFA) 108 (90 Base) MCG/ACT inhaler [id number]  Inhale 1-2 puffs into the lungs every 6 (six) hours as needed for wheezing or shortness of breath. McLean-Scocuzza, Randine SAILOR, MD  Active   Azelastine  HCl 137 MCG/SPRAY SOLN [id number]  PLACE 1 SPRAY INTO BOTH NOSTRILS 2 (TWO) TIMES DAILY. USE IN EACH NOSTRIL AS DIRECTED Webb, Padonda B, FNP  Active    chlorpheniramine-HYDROcodone  (TUSSIONEX) 10-8 MG/5ML [id number]  Take 5 mLs by mouth every 12 (twelve) hours as needed for cough. Georgina Speaks, FNP  Active   clotrimazole -betamethasone  (LOTRISONE ) cream [id number]  Apply 1 application topically 2 (two) times daily. Prn leg dermatitis  Patient not taking: Reported on 10/21/2023   McLean-Scocuzza, Randine SAILOR, MD  Active   Colchicine  (MITIGARE ) 0.6 MG CAPS [id number]  Take 1 capsule by mouth as needed. As needed gout flare max # of pills 2 in 24 hours McLean-Scocuzza, Randine SAILOR, MD  Active   fluticasone  (FLONASE ) 50 MCG/ACT nasal spray [id number]  Place into both nostrils daily as needed for allergies or rhinitis. [provider]  Active Self  Fluticasone -Salmeterol (ADVAIR  HFA IN) [id number]  Inhale 1 puff into the lungs as needed. [provider]  Active   heparin  lock flush 100 unit/mL [id number]   Odean Potts, MD  Active   ibuprofen  (ADVIL ) 800 MG tablet [id number]  Take 1 tablet (800 mg total) by mouth every 8 (eight) hours as needed. McLean-Scocuzza, Randine SAILOR, MD  Active   linaclotide  (LINZESS ) 72 MCG capsule [id number]  Take 1 capsule (72 mcg total) by mouth daily before breakfast. Georgina Speaks, FNP  Active   loratadine  (CLARITIN ) 10 MG tablet [id number]  Take 1 tablet (10 mg total) by mouth daily. McLean-Scocuzza, Randine SAILOR, MD  Active   losartan  (COZAAR ) 25 MG tablet [id number]  Take 1 tablet (25 mg total) by mouth daily. Bensimhon, Toribio SAUNDERS, MD  Active   Magnesium  Cl-Calcium  Carbonate (SLOW MAGNESIUM /CALCIUM ) 70-117 MG TBEC [id number]  Take 2 tablets by mouth in the morning and at bedtime. Gudena, Vinay, MD  Active Self  metoprolol  tartrate (LOPRESSOR ) 25 MG tablet [id number]  TAKE 1 TABLET BY MOUTH TWICE A DAY McLean-Scocuzza, Randine SAILOR, MD  Active   Multiple Vitamin (MULTIVITAMIN) tablet [id number]  Take 1 tablet by mouth daily. [provider]  Active Self  omeprazole  (PRILOSEC) 40 MG capsule [id number]  Take 40 mg by mouth as  needed. [provider]  Active   OPSUMIT  10 MG TABS [id number]  Take 10 mg by mouth daily. [provider]  Active Self  ORENITRAM  0.25 MG TBCR [id number]  Take 0.5 mg by mouth.  Take 2 tablets (0.5 mg total) by mouth every eight (8) hours. Take along with 5 mg tablet and four 1 mg tablets, total 8.5 mg every 8 hours [provider]  Active   ORENITRAM  1 MG TBCR [id number]  Take by mouth. [provider]  Active   ORENITRAM  5 MG TBCR [id number]  Take by mouth. [provider]  Active   Polyethyl Glycol-Propyl Glycol (SYSTANE OP) [id number]  Place 1 drop into both eyes 3 (three) times daily as needed (dry/irritated eyes.). [provider]  Active Self  Potassium Chloride  ER 20 MEQ TBCR [id number]  Take 1 tablet by mouth 2 (two) times daily. [provider]  Active   potassium chloride  SA (KLOR-CON ) 20 MEQ tablet [id number]  Take 1 tablet (20 mEq total) by mouth daily.  Patient taking differently: Take 20 mEq by mouth as needed.   Gudena, Vinay, MD  Active Self    Discontinued 08/17/16 0953   rivaroxaban  (XARELTO ) 20 MG TABS tablet [id number]  Take 20 mg by mouth daily with supper.  Patient not taking: Reported on 10/21/2023   [provider]  Active Self  Semaglutide , 2 MG/DOSE, (OZEMPIC , 2 MG/DOSE,) 8 MG/3ML SOPN [id number]  Inject 2 mg into the skin once a week. Rayburn, Elouise Phlegm, PA-C  Active   sildenafil  (REVATIO ) 20 MG tablet [id number]  Take 1-2 tablets (20-40 mg total) by mouth 3 (three) times daily. As of 07/03/20 taking 20 mg tid per [facility] pulm Dr. LEvarge McLean-Scocuzza, Randine SAILOR, MD  Active   sodium chloride  flush (NS) 0.9 % injection 10 mL [id number]   Odean Potts, MD  Active   spironolactone  (ALDACTONE ) 50 MG tablet [id number]  Take 1 tablet (50 mg total) by mouth daily.  Patient not taking: Reported on 10/21/2023   McLean-Scocuzza, Randine SAILOR, MD  Active   torsemide  (DEMADEX ) 20 MG tablet [id number]  Take 20 mg by mouth  daily as needed. [provider]  Active   triamcinolone  cream (KENALOG ) 0.1 % [id number]  Apply 1 application topically daily as needed (leg discoloration). McLean-Scocuzza, Randine SAILOR, MD  Active Self  Vitamin D , Ergocalciferol , (DRISDOL ) 1.25 MG (50000 UNIT) CAPS capsule [id number]  Take 1 capsule (50,000 Units total) by mouth every 7 (seven) days. *Need office visit for refills* Rayburn, Elouise Phlegm, PA-C  Active   WINREVAIR 2 x 45 MG subcutaneous injection [id number]  Inject into the skin. [provider]  Active             Recommendation:   Continue Current Plan of Care  Follow Up Plan:   Telephone follow-up in 1 month  Rolin Kerns, LCSW [facility]  [facility], [facility] Clinical Social Worker Direct Dial: [phone]  Fax: [phone] Website: [email or web address] 9:34 AM

## 2023-10-22 NOTE — Patient Instructions (Signed)
 Visit Information  Thank you for taking time to visit with me today. Please don't hesitate to contact me if I can be of assistance to you before our next scheduled appointment.  Your next care management appointment is by telephone on 08/19 at 10:30 AM  Please call the care guide team at [phone] if you need to cancel, schedule, or reschedule an appointment.   Please call the Suicide and Crisis Lifeline: 988 go to [facility] [address] [place] [phone]) call 911 if you are experiencing a Mental Health or Behavioral Health Crisis or need someone to talk to.  Rolin Kerns, LCSW [place]  [facility], [facility] Clinical Social Worker Direct Dial: [phone]  Fax: [phone] Website: [email or web address] 9:35 AM

## 2023-10-25 ENCOUNTER — Other Ambulatory Visit (HOSPITAL_COMMUNITY): Payer: Self-pay

## 2023-10-26 ENCOUNTER — Inpatient Hospital Stay: Payer: 59 | Attending: Hematology and Oncology | Admitting: Hematology and Oncology

## 2023-10-26 ENCOUNTER — Encounter: Payer: Self-pay | Admitting: Internal Medicine

## 2023-10-26 ENCOUNTER — Ambulatory Visit (INDEPENDENT_AMBULATORY_CARE_PROVIDER_SITE_OTHER): Admitting: Family Medicine

## 2023-10-26 ENCOUNTER — Other Ambulatory Visit (HOSPITAL_COMMUNITY): Payer: Self-pay

## 2023-10-26 ENCOUNTER — Encounter (INDEPENDENT_AMBULATORY_CARE_PROVIDER_SITE_OTHER): Payer: Self-pay | Admitting: Family Medicine

## 2023-10-26 VITALS — BP 142/78 | HR 69 | Temp 98.5°F | Resp 17 | Ht 65.0 in | Wt 243.7 lb

## 2023-10-26 VITALS — BP 155/84 | HR 97 | Temp 97.8°F | Ht 65.0 in | Wt 241.0 lb

## 2023-10-26 DIAGNOSIS — Z170 Estrogen receptor positive status [ER+]: Secondary | ICD-10-CM | POA: Diagnosis not present

## 2023-10-26 DIAGNOSIS — R809 Proteinuria, unspecified: Secondary | ICD-10-CM

## 2023-10-26 DIAGNOSIS — E7849 Other hyperlipidemia: Secondary | ICD-10-CM

## 2023-10-26 DIAGNOSIS — Z6841 Body Mass Index (BMI) 40.0 and over, adult: Secondary | ICD-10-CM

## 2023-10-26 DIAGNOSIS — Z923 Personal history of irradiation: Secondary | ICD-10-CM | POA: Diagnosis not present

## 2023-10-26 DIAGNOSIS — E559 Vitamin D deficiency, unspecified: Secondary | ICD-10-CM | POA: Diagnosis not present

## 2023-10-26 DIAGNOSIS — E785 Hyperlipidemia, unspecified: Secondary | ICD-10-CM

## 2023-10-26 DIAGNOSIS — I10 Essential (primary) hypertension: Secondary | ICD-10-CM | POA: Diagnosis not present

## 2023-10-26 DIAGNOSIS — Z79811 Long term (current) use of aromatase inhibitors: Secondary | ICD-10-CM | POA: Insufficient documentation

## 2023-10-26 DIAGNOSIS — C50212 Malignant neoplasm of upper-inner quadrant of left female breast: Secondary | ICD-10-CM | POA: Insufficient documentation

## 2023-10-26 DIAGNOSIS — Z1722 Progesterone receptor negative status: Secondary | ICD-10-CM | POA: Insufficient documentation

## 2023-10-26 DIAGNOSIS — E1129 Type 2 diabetes mellitus with other diabetic kidney complication: Secondary | ICD-10-CM

## 2023-10-26 DIAGNOSIS — Z1731 Human epidermal growth factor receptor 2 positive status: Secondary | ICD-10-CM | POA: Diagnosis not present

## 2023-10-26 DIAGNOSIS — Z9221 Personal history of antineoplastic chemotherapy: Secondary | ICD-10-CM | POA: Insufficient documentation

## 2023-10-26 DIAGNOSIS — E669 Obesity, unspecified: Secondary | ICD-10-CM

## 2023-10-26 DIAGNOSIS — Z7985 Long-term (current) use of injectable non-insulin antidiabetic drugs: Secondary | ICD-10-CM

## 2023-10-26 MED ORDER — OZEMPIC (2 MG/DOSE) 8 MG/3ML ~~LOC~~ SOPN
2.0000 mg | PEN_INJECTOR | SUBCUTANEOUS | 1 refills | Status: DC
Start: 2023-10-26 — End: 2024-01-02
  Filled 2023-10-26 – 2023-11-17 (×2): qty 3, 28d supply, fill #0

## 2023-10-26 MED ORDER — VITAMIN D (ERGOCALCIFEROL) 1.25 MG (50000 UNIT) PO CAPS
50000.0000 [IU] | ORAL_CAPSULE | ORAL | 0 refills | Status: DC
  Filled 2023-10-26: qty 5, 35d supply, fill #0

## 2023-10-26 NOTE — Assessment & Plan Note (Signed)
 Left lumpectomy 07/28/2016: IDC grade 2, 2.6 cm, DCIS intermediate grade,0/3 lymph nodes negative, ER 60%, PR 0%, Ki-67 30%, HER-2 positive ratio 6.04, T2 N0 stage II a   Treatment summary: 1. Adjuvant chemotherapy with Abraxane  and Herceptin  (cannot take Taxol  because she could not receive steroids) weekly 12 followed by every 3 week Herceptin  for one year until May 2019 2. followed by adjuvant radiation 11/25/2016-01/05/2017  3. Followed by adjuvant antiestrogen therapy started 01/28/2017 ------------------------------------------------------------------------------------------------------------------------------- Treatment plan: Adjuvant antiestrogen therapy with anastrozole  1 mg daily 01/28/2017 Adjuvant Herceptin  completed May 2019   Anastrozole  toxicities: Denies any hot flashes arthralgias or myalgias. Pulmonary hypertension: This is probably her biggest problem currently.  Uses 24-hour oxygen  Recurrent gout   Surveillance: 1. Breast exam 10/26/2022: Benign. Scar tissue in the breast: It is formed into a big knot.  She would like it removed.  I sent a referral to Dr. Arelia. 2. mammograms [facility]/24/2022 at [facility] benign: Breast density category B   Pulmonary hypertension: [facility] pulmonologist at [facility], uses oxygen  24 / 7 On Mounjaro : Lost 20 pounds and since then her breathing has improved.   Thrombocytopenia:   Lab review: 11/06/2020: Platelets 94 07/03/2021: Platelets 124 10/26/2022: Platelets 123   Return to clinic on an as-needed basis.

## 2023-10-26 NOTE — Progress Notes (Signed)
 Office: [phone]  /  Fax: [phone]  WEIGHT SUMMARY AND BIOMETRICS  Anthropometric Measurements Height: 5' 5 (1.651 m) Weight: 241 lb (109.3 kg) BMI (Calculated): 40.1 Weight at Last Visit: 237 lb Weight Lost Since Last Visit: 0 Weight Gained Since Last Visit: 4 lb Starting Weight: 241 lb Total Weight Loss (lbs): 0 lb (0 kg) Peak Weight: 256 lb   Body Composition  Body Fat %: 48.6 % Fat Mass (lbs): 117.4 lbs Muscle Mass (lbs): 118 lbs Total Body Water (lbs): 86.2 lbs Visceral Fat Rating : 15   Other Clinical Data Fasting: no Labs: no Today's Visit #: 28 Starting Date: 07/24/21    Chief Complaint: OBESITY    History of Present Illness [name] is a 58 year old female with obesity and type 2 diabetes who presents for obesity treatment and diabetes management.  She has been on Ozempic  2 mg for the past six months to manage her diabetes. Despite this, she has gained four pounds in the last six weeks, regaining all the weight she had previously lost. She follows a category two eating plan about 80% of the time and exercises 60 minutes three times a week, participating in [employer] and aerobics.  She has a history of vitamin D  deficiency and is on prescription vitamin D  50,000 IU per week. She requests a refill for this medication. Her last vitamin D  level was checked in October, and she is due for a re-evaluation.  She has a history of hypertension, with current blood pressure readings of 155/84 and 154/98. She reports that her blood pressure was stable before switching from IV to oral medication, and she is concerned that this change may have affected her blood pressure control.  She is a breast cancer survivor, now seven years post-treatment, and recently had her last oncology appointment.  She reports gastrointestinal issues related to her new oral medication, Orenitram , which requires her to eat enough food to prevent stomach upset. She has been  experimenting with different breakfast options to manage these symptoms, finding that a breakfast of one pancake, two small pieces of bacon, and one egg was effective in preventing stomach pain.  She has a family history of stroke, as her father recently suffered a stroke two months ago, which has been a source of stress for her.  She has a history of gout and recently experienced a flare-up, for which she took colchicine  twice, resolving the symptoms.  She is not currently on medication for cholesterol, despite a recent increase in LDL levels, which she attributes to dietary changes necessitated by her medication regimen.      PHYSICAL EXAM:  Blood pressure (!) 155/84, pulse 97, temperature 97.8 F (36.6 C), height 5' 5 (1.651 m), weight 241 lb (109.3 kg), last menstrual period 05/11/2013, SpO2 90%. Body mass index is 40.1 kg/m.  DIAGNOSTIC DATA REVIEWED:  BMET    Component Value Date/Time   NA 141 10/19/2023 1112   NA 139 03/31/2017 1332   K 4.3 10/19/2023 1112   K 3.6 03/31/2017 1332   CL 103 10/19/2023 1112   CO2 20 10/19/2023 1112   CO2 26 03/31/2017 1332   GLUCOSE 113 (H) 10/19/2023 1112   GLUCOSE 90 01/13/2023 1512   GLUCOSE 129 03/31/2017 1332   BUN 19 10/19/2023 1112   BUN 18.1 03/31/2017 1332   CREATININE 1.00 10/19/2023 1112   CREATININE 0.68 10/26/2022 1058   CREATININE 1.0 03/31/2017 1332   CALCIUM  9.1 10/19/2023 1112   CALCIUM   9.2 03/31/2017 1332   GFRNONAA >60 01/13/2023 1512   GFRNONAA >60 10/26/2022 1058   GFRAA 83 03/05/2020 1409   Lab Results  Component Value Date   HGBA1C 5.3 10/19/2023   HGBA1C 6.2 (H) 06/09/2013   No results found for: INSULIN  Lab Results  Component Value Date   TSH 0.82 07/03/2021   CBC    Component Value Date/Time   WBC 5.2 01/13/2023 1512   RBC 5.25 (H) 01/13/2023 1512   HGB 17.0 (H) 01/13/2023 1512   HGB 15.1 (H) 10/26/2022 1058   HGB 11.0 (L) 11/06/2020 1406   HGB 13.4 03/31/2017 1332   HCT 49.0 (H)  01/13/2023 1512   HCT 32.7 (L) 11/06/2020 1406   HCT 39.7 03/31/2017 1332   PLT 126 (L) 01/13/2023 1512   PLT 123 (L) 10/26/2022 1058   PLT 94 (LL) 11/06/2020 1406   MCV 93.3 01/13/2023 1512   MCV 92 11/06/2020 1406   MCV 99.3 03/31/2017 1332   MCH 32.4 01/13/2023 1512   MCHC 34.7 01/13/2023 1512   RDW 13.7 01/13/2023 1512   RDW 13.1 11/06/2020 1406   RDW 14.0 03/31/2017 1332   Iron Studies    Component Value Date/Time   IRON 81 12/23/2020 1045   IRON 33 (L) 06/14/2013 1141   TIBC 366 12/23/2020 1045   TIBC 441 06/14/2013 1141   FERRITIN 160 12/23/2020 1045   FERRITIN 88 06/14/2013 1141   IRONPCTSAT 22 12/23/2020 1045   IRONPCTSAT 16 08/30/2017 1648   Lipid Panel     Component Value Date/Time   CHOL 248 (H) 10/19/2023 1112   TRIG 220 (H) 10/19/2023 1112   HDL 43 10/19/2023 1112   CHOLHDL 5.8 (H) 10/19/2023 1112   CHOLHDL 4 07/03/2021 1138   VLDL 23.6 07/03/2021 1138   LDLCALC 164 (H) 10/19/2023 1112   Hepatic Function Panel     Component Value Date/Time   PROT 7.4 01/13/2023 1512   PROT 7.0 12/01/2022 1506   PROT 7.8 03/31/2017 1332   ALBUMIN  3.5 01/13/2023 1512   ALBUMIN  3.9 12/01/2022 1506   ALBUMIN  3.5 03/31/2017 1332   AST 15 01/13/2023 1512   AST 11 (L) 10/26/2022 1058   AST 16 03/31/2017 1332   ALT 12 01/13/2023 1512   ALT 9 10/26/2022 1058   ALT 14 03/31/2017 1332   ALKPHOS 70 01/13/2023 1512   ALKPHOS 96 03/31/2017 1332   BILITOT 0.8 01/13/2023 1512   BILITOT 0.7 12/01/2022 1506   BILITOT 0.7 10/26/2022 1058   BILITOT 0.97 03/31/2017 1332   BILIDIR 0.2 12/31/2015 1315      Component Value Date/Time   TSH 0.82 07/03/2021 1138   Nutritional Lab Results  Component Value Date   VD25OH 22.1 (L) 12/30/2022   VD25OH 30.9 11/12/2022   VD25OH 43.0 07/13/2022     Assessment and Plan Assessment & Plan Obesity Obesity with recent weight gain of four pounds over six weeks. She is on a category two eating plan, following it 80% of the time, and  exercises 60 minutes three times a week. Discussed dietary adjustments to manage weight and medication side effects, including the importance of protein intake to mitigate GI issues. - Continue category two eating plan - Continue exercise regimen - Adjust diet to include more protein to manage medication side effects  Type 2 diabetes mellitus with microalbuminuria Type 2 diabetes well-controlled on Ozempic  2 mg with recent hemoglobin A1c of 5.3. Discussed potential future switch to Mounjaro  for better weight loss once GI issues  are settled. Current treatment is effective in controlling blood glucose levels. - Refill Ozempic  2 mg - Continue category two eating plan - Continue exercise regimen  Hypertension Hypertension with current blood pressure readings of 155/84 and 154/98. Blood pressure may be affected by recent medication changes. Discussed the importance of managing blood pressure to reduce cardiovascular risk, especially given family history of stroke. - Monitor blood pressure at home once or twice a week - Continue category two eating plan - Continue exercise regimen  Hyperlipidemia Elevated LDL cholesterol at 124 mg/dL. Discussed dietary changes and potential need for medication if levels remain elevated. Family history of stroke increases cardiovascular risk. Explained that some cholesterol production is genetic and age-related, and not entirely diet-dependent. - Continue dietary modifications - Reassess cholesterol levels in future visits - Continue category two eating plan - Continue exercise regimen  Vitamin D  deficiency Vitamin D  deficiency managed with prescription vitamin D  50,000 IU weekly. Last vitamin D  level checked in October. Discussed importance of monitoring levels to avoid toxicity. - Refill vitamin D  50,000 IU weekly - Order vitamin D  level test     She was informed of the importance of frequent follow up visits to maximize her success with intensive lifestyle  modifications for her multiple health conditions. FU 4 weeks   Louann Penton, MD

## 2023-10-26 NOTE — Progress Notes (Signed)
 Patient Care Team: Georgina Speaks, FNP as PCP - General (General Practice) Lenon Nell SAILOR, FNP as Nurse Practitioner (Nurse Practitioner) Aron Shoulders, MD as Consulting Physician (General Surgery) Odean Potts, MD as Consulting Physician (Hematology and Oncology) Shannon Agent, MD as Consulting Physician (Radiation Oncology) Crawford, Morna Pickle, NP as Nurse Practitioner (Hematology and Oncology) Ezzard Rolin BIRCH, LCSW as VBCI Care Management (Licensed Clinical Social Worker) Morgan, Clayborne CROME, RN as VBCI Care Management  DIAGNOSIS:  Encounter Diagnosis  Name Primary?   Malignant neoplasm of upper-inner quadrant of left breast in female, estrogen receptor positive (HCC) Yes    SUMMARY OF ONCOLOGIC HISTORY: Oncology History  Malignant neoplasm of upper-inner quadrant of left breast in female, estrogen receptor positive (HCC)  03/04/2016 Initial Diagnosis   Screening detected left breast density posterior medial 1.4 cm by ultrasound axilla negative, grade 3 IDC ER 60%, PR 0%, Ki-67 30%, HER-2 positive ratio 6.04, copy #16; T1 cN0 stage IA clinical stage   03/21/2016 Genetic Testing   NEgative genetic testing on the comprehensive cancer panel and Negative genetic testing for the MSH2 inversion analysis ([place] inversion). The Comprehensive Cancer Panel offered by [facility] includes sequencing and/or deletion duplication testing of the following 32 genes: APC, ATM, AXIN2, BARD1, BMPR1A, BRCA1, BRCA2, BRIP1, CDH1, CDK4, CDKN2A, CHEK2, EPCAM, FANCC, MLH1, MSH2, MSH6, MUTYH, NBN, PALB2, PMS2, POLD1, POLE, PTEN, RAD51C, RAD51D, SCG5/GREM1, SMAD4, STK11, TP53, VHL, and XRCC2.   The report date is March 21, 2016.   07/27/2016 Surgery   Left lumpectomy: IDC grade 2, 2.6 cm, DCIS intermediate grade,0/3 lymph nodes negative, ER 60%, PR 0%, Ki-67 30%, HER-2 positive ratio 6.04, T2 N0 stage II a   09/01/2016 - 11/03/2016 Chemotherapy   Abraxane  Herceptin  weekly 12 followed by Herceptin   maintenance every 3 weeks     11/19/2016 - 01/20/2017 Radiation Therapy   Adjuvant radiation therapy   01/29/2017 -  Anti-estrogen oral therapy   Anastrozole  2.5 mg daily     CHIEF COMPLIANT: Surveillance of breast cancer  HISTORY OF PRESENT ILLNESS:  History of Present Illness [name] is a 58 year old female who presents for follow-up r of history of breast cancer.  She completed antiestrogen therapy last year.  She was diagnosed with idiopathic pulmonary fibrosis and uses 24-hour oxygen    She is on 2 liters of oxygen , reduced from previous levels, and has been on oxygen  therapy for three to four years. During a six-minute walk test in June, she did not require oxygen . She seeks a local program to reduce oxygen  dependency.  She experienced weight fluctuations, losing 25 pounds and regaining some, coinciding with switching from IV to oral tropracetal, which requires a full meal to prevent stomach issues. She temporarily stopped her injections to adjust to the new medication.  Her platelet count was 212 in June. Liver and kidney function were last checked in March. Calcium  and iron levels were assessed.  She completed anastrozole  treatment for breast cancer in November 2024. She continues regular mammograms at [place], with the last one in December. She consulted plastic surgery about a hard breast mass but was advised against removal.     ALLERGIES:  is allergic to ampicillin, amoxicillin, bismuth -containing compounds, indomethacin , nsaids, penicillins, sulfasalazine, metronidazole , and sulfa antibiotics.  MEDICATIONS:  Current Outpatient Medications  Medication Sig Dispense Refill   acetaminophen  (TYLENOL ) 500 MG tablet Take 1,000 mg by mouth every 6 (six) hours as needed for moderate pain or headache.      albuterol  (ACCUNEB ) 1.25 MG/3ML  nebulizer solution Take 3 mLs (1.25 mg total) by nebulization every 6 (six) hours as needed for wheezing. 360 mL 12   albuterol  (PROAIR   HFA) 108 (90 Base) MCG/ACT inhaler Inhale 1-2 puffs into the lungs every 6 (six) hours as needed for wheezing or shortness of breath. 18 g 11   Azelastine  HCl 137 MCG/SPRAY SOLN PLACE 1 SPRAY INTO BOTH NOSTRILS 2 (TWO) TIMES DAILY. USE IN EACH NOSTRIL AS DIRECTED 30 mL 4   chlorpheniramine-HYDROcodone  (TUSSIONEX) 10-8 MG/5ML Take 5 mLs by mouth every 12 (twelve) hours as needed for cough. 115 mL 0   clotrimazole -betamethasone  (LOTRISONE ) cream Apply 1 application topically 2 (two) times daily. Prn leg dermatitis 45 g 0   Colchicine  (MITIGARE ) 0.6 MG CAPS Take 1 capsule by mouth as needed. As needed gout flare max # of pills 2 in 24 hours 30 capsule 11   fluticasone  (FLONASE ) 50 MCG/ACT nasal spray Place into both nostrils daily as needed for allergies or rhinitis.     Fluticasone -Salmeterol (ADVAIR  HFA IN) Inhale 1 puff into the lungs as needed.     ibuprofen  (ADVIL ) 800 MG tablet Take 1 tablet (800 mg total) by mouth every 8 (eight) hours as needed. 90 tablet 1   linaclotide  (LINZESS ) 72 MCG capsule Take 1 capsule (72 mcg total) by mouth daily before breakfast. 90 capsule 1   loratadine  (CLARITIN ) 10 MG tablet Take 1 tablet (10 mg total) by mouth daily. 90 tablet 3   losartan  (COZAAR ) 25 MG tablet Take 1 tablet (25 mg total) by mouth daily. 90 tablet 3   Magnesium  Cl-Calcium  Carbonate (SLOW MAGNESIUM /CALCIUM ) 70-117 MG TBEC Take 2 tablets by mouth in the morning and at bedtime.     metoprolol  tartrate (LOPRESSOR ) 25 MG tablet TAKE 1 TABLET BY MOUTH TWICE A DAY 180 tablet 3   Multiple Vitamin (MULTIVITAMIN) tablet Take 1 tablet by mouth daily.     omeprazole  (PRILOSEC) 40 MG capsule Take 40 mg by mouth as needed.     OPSUMIT  10 MG TABS Take 10 mg by mouth daily.  8   ORENITRAM  0.25 MG TBCR Take 0.5 mg by mouth.  Take 2 tablets (0.5 mg total) by mouth every eight (8) hours. Take along with 5 mg tablet and four 1 mg tablets, total 8.5 mg every 8 hours     ORENITRAM  1 MG TBCR Take by mouth.      ORENITRAM  5 MG TBCR Take by mouth.     Polyethyl Glycol-Propyl Glycol (SYSTANE OP) Place 1 drop into both eyes 3 (three) times daily as needed (dry/irritated eyes.).     Potassium Chloride  ER 20 MEQ TBCR Take 1 tablet by mouth 2 (two) times daily.     potassium chloride  SA (KLOR-CON ) 20 MEQ tablet Take 1 tablet (20 mEq total) by mouth daily.     rivaroxaban  (XARELTO ) 20 MG TABS tablet Take 20 mg by mouth daily with supper.     Semaglutide , 2 MG/DOSE, (OZEMPIC , 2 MG/DOSE,) 8 MG/3ML SOPN Inject 2 mg into the skin once a week. 3 mL 1   sildenafil  (REVATIO ) 20 MG tablet Take 1-2 tablets (20-40 mg total) by mouth 3 (three) times daily. As of 07/03/20 taking 20 mg tid per [facility] pulm Dr. LEvarge 10 tablet 0   spironolactone  (ALDACTONE ) 50 MG tablet Take 1 tablet (50 mg total) by mouth daily. 90 tablet 3   torsemide  (DEMADEX ) 20 MG tablet Take 20 mg by mouth daily as needed.     triamcinolone  cream (KENALOG ) 0.1 %  Apply 1 application topically daily as needed (leg discoloration). 454 g 2   Vitamin D , Ergocalciferol , (DRISDOL ) 1.25 MG (50000 UNIT) CAPS capsule Take 1 capsule (50,000 Units total) by mouth every 7 (seven) days. *Need office visit for refills* 5 capsule 0   WINREVAIR 2 x 45 MG subcutaneous injection Inject into the skin.     No current facility-administered medications for this visit.   Facility-Administered Medications Ordered in Other Visits  Medication Dose Route Frequency Provider Last Rate Last Admin   heparin  lock flush 100 unit/mL  500 Units Intracatheter Once PRN Odean Potts, MD       sodium chloride  flush (NS) 0.9 % injection 10 mL  10 mL Intracatheter PRN Odean Potts, MD        PHYSICAL EXAMINATION: ECOG PERFORMANCE STATUS: 1 - Symptomatic but completely ambulatory  Vitals:   10/26/23 1117  BP: (!) 142/78  Pulse: 69  Resp: 17  Temp: 98.5 F (36.9 C)  SpO2: 97%   Filed Weights   10/26/23 1117  Weight: 243 lb 11.2 oz (110.5 kg)      LABORATORY DATA:  I have  reviewed the data as listed    Latest Ref Rng & Units 10/19/2023   11:12 AM 09/24/2023   12:00 AM 04/13/2023   12:03 PM  CMP  Glucose 70 - 99 mg/dL 886   83   BUN 6 - 24 mg/dL 19  12     15    Creatinine 0.57 - 1.00 mg/dL 8.99  0.8     9.20   Sodium 134 - 144 mmol/L 141  143     143   Potassium 3.5 - 5.2 mmol/L 4.3  3.9     3.8   Chloride 96 - 106 mmol/L 103  106     104   CO2 20 - 29 mmol/L 20  29     25    Calcium  8.7 - 10.2 mg/dL 9.1  9.3     8.9      This result is from an external source.    Lab Results  Component Value Date   WBC 5.2 01/13/2023   HGB 17.0 (H) 01/13/2023   HCT 49.0 (H) 01/13/2023   MCV 93.3 01/13/2023   PLT 126 (L) 01/13/2023   NEUTROABS 3.1 01/13/2023    ASSESSMENT & PLAN:  Malignant neoplasm of upper-inner quadrant of left breast in female, estrogen receptor positive (HCC) Left lumpectomy 07/28/2016: IDC grade 2, 2.6 cm, DCIS intermediate grade,0/3 lymph nodes negative, ER 60%, PR 0%, Ki-67 30%, HER-2 positive ratio 6.04, T2 N0 stage II a   Treatment summary: 1. Adjuvant chemotherapy with Abraxane  and Herceptin  (cannot take Taxol  because she could not receive steroids) weekly 12 followed by every 3 week Herceptin  for one year until May 2019 2. followed by adjuvant radiation 11/25/2016-01/05/2017  3. Followed by adjuvant antiestrogen therapy started 01/28/2017 ------------------------------------------------------------------------------------------------------------------------------- Treatment plan: Adjuvant antiestrogen therapy with anastrozole  1 mg daily 01/28/2017 Adjuvant Herceptin  completed May 2019   Anastrozole  toxicities: Denies any hot flashes arthralgias or myalgias. Pulmonary hypertension: This is probably her biggest problem currently.  Uses 24-hour oxygen  Recurrent gout   Surveillance: 1. Breast exam 10/26/2022: Benign. Scar tissue in the breast: It is formed into a big knot.  She would like it removed.  I sent a referral to Dr.  Arelia. 2. mammograms 03/24/2022 at [facility] benign: Breast density category B   Pulmonary hypertension: Seeing pulmonologist at [facility], uses oxygen  24 / 7 On Mounjaro : Lost 20 pounds and  since then her breathing has improved.   Thrombocytopenia:   Lab review: 11/06/2020: Platelets 94 07/03/2021: Platelets 124 10/26/2022: Platelets 123 June 2025: Platelets 221   Return to clinic on an as-needed basis. ------------------------------------- Assessment and Plan Assessment & Plan Malignant neoplasm of left breast, estrogen receptor positive Completed anastrozole  treatment. Last mammogram on December 27th showed no abnormalities. Breast tissue density favorable. Long-term benefits of anastrozole  expected. - Continue [facility]. - No further medication required for breast cancer at this time. - Return as needed for any concerns.  Chronic hypoxemia Managed with supplemental oxygen  at 2 liters. Reduced oxygen  requirement. Interested in oxygen  weaning program. Recent six-minute walk test at [facility] indicated no oxygen  needed, accuracy uncertain. - Investigate local pulmonary rehab programs to assist in weaning off oxygen . - Contact physical therapist to inquire about local resources for oxygen  weaning. - Consider referral to pulmonary rehab if a suitable program is found.   No orders of the defined types were placed in this encounter.  The patient has a good understanding of the overall plan. she agrees with it. she will call with any problems that may develop before the next visit here. Total time spent: 30 mins including face to face time and time spent for planning, charting and co-ordination of care   Naomi MARLA Chad, MD 10/26/23

## 2023-10-27 ENCOUNTER — Telehealth (HOSPITAL_COMMUNITY): Payer: Self-pay

## 2023-10-27 NOTE — Telephone Encounter (Signed)
 Called patient to see if she was interested in participating in the Pulmonary Rehab Program. Patient will come in for orientation on 8/06 and will attend the 10:15 exercise class.  Sent MyChart message.

## 2023-10-27 NOTE — Telephone Encounter (Signed)
 Pt insurance is active and benefits verified through [facility] [employer]. Co-pay $0, DED $257/$257 met, out of pocket $9,350/$5,505.93 met, co-insurance 20%. No pre-authorization required. 10/27/2023 @ 11:50am, spoke with Odella HERO., REF# [id number].

## 2023-10-29 ENCOUNTER — Encounter: Payer: Self-pay | Admitting: Pharmacist

## 2023-11-02 ENCOUNTER — Telehealth (HOSPITAL_COMMUNITY): Payer: Self-pay

## 2023-11-02 ENCOUNTER — Ambulatory Visit

## 2023-11-02 NOTE — Telephone Encounter (Signed)
 Called to confirm appt. Pt confirmed appt. Instructed pt on proper footwear. Gave directions along with department number.

## 2023-11-03 ENCOUNTER — Encounter (HOSPITAL_COMMUNITY)
Admission: RE | Admit: 2023-11-03 | Discharge: 2023-11-03 | Disposition: A | Discharge status: Home / Self Care | Source: Ambulatory Visit | Attending: Pulmonary Disease | Admitting: Pulmonary Disease

## 2023-11-03 ENCOUNTER — Telehealth (HOSPITAL_COMMUNITY): Payer: Self-pay

## 2023-11-03 ENCOUNTER — Encounter (HOSPITAL_COMMUNITY): Payer: Self-pay

## 2023-11-03 VITALS — BP 156/100 | HR 79 | Wt 247.6 lb

## 2023-11-03 DIAGNOSIS — I2721 Secondary pulmonary arterial hypertension: Secondary | ICD-10-CM | POA: Insufficient documentation

## 2023-11-03 NOTE — Telephone Encounter (Signed)
 Called to confirm/remind patient of [facility] on 11/[facility].   Appointment:   [x] Confirmed  [] Left mess   [] No answer/No voice mail  [] VM Full/unable to leave message  [] Phone not in service  Patient reminded to bring all medications and/or complete list.  Confirmed patient has transportation. Gave directions, instructed to utilize valet parking.

## 2023-11-03 NOTE — Progress Notes (Signed)
 [name] 58 y.o. female Pulmonary Rehab Orientation Note This patient who was referred to Pulmonary Rehab by Dr. Janie with the diagnosis of PAH arrived today in Cardiac and Pulmonary Rehab. She  arrived ambulatory with normal gait. She  does carry portable oxygen . [employer] is the provider for their DME. Per patient, [name] uses oxygen  continuously. Color good, skin warm and dry. Patient is oriented to time and place. Patient's medical history, psychosocial health, and medications reviewed. Psychosocial assessment reveals patient lives with alone. [name] is currently unemployed, disabled. Patient hobbies include watching tv and reading. Patient reports her stress level is moderate. Areas of stress/anxiety include health and family . Patient does exhibit signs of depression. Signs of depression include anxiety. PHQ2/9 score 0/4. [name] shows good  coping skills with positive outlook on life. Offered emotional support and reassurance. Will continue to monitor. Physical assessment performed by Nurse pick: Tiffany Levin RN. Please see their orientation physical assessment note. [name] reports she does take medications as prescribed. Patient states she follows a low sodium diet. The patient reports no specific efforts to gain or lose weight.. Patient's weight will be monitored closely. Demonstration and practice of PLB using pulse oximeter. [name] able to return demonstration satisfactorily. Safety and hand hygiene in the exercise area reviewed with patient. [name] voices understanding of the information reviewed. Department expectations discussed with patient and achievable goals were set. The patient shows enthusiasm about attending the program and we look forward to working with Tiffany. [name] completed a 6 min walk test today and is scheduled to begin exercise on 11/11/23.   [id number] [name], BSRT

## 2023-11-04 ENCOUNTER — Inpatient Hospital Stay (HOSPITAL_COMMUNITY)
Admission: RE | Admit: 2023-11-04 | Discharge: 2023-11-04 | Disposition: A | Discharge status: Home / Self Care | Source: Ambulatory Visit | Attending: Cardiology

## 2023-11-04 ENCOUNTER — Other Ambulatory Visit (HOSPITAL_COMMUNITY): Payer: Self-pay

## 2023-11-04 ENCOUNTER — Telehealth: Payer: Self-pay | Admitting: Pharmacist

## 2023-11-04 ENCOUNTER — Other Ambulatory Visit (HOSPITAL_COMMUNITY): Payer: Self-pay | Admitting: Cardiology

## 2023-11-04 ENCOUNTER — Ambulatory Visit (HOSPITAL_COMMUNITY): Payer: Self-pay | Admitting: Cardiology

## 2023-11-04 ENCOUNTER — Encounter (HOSPITAL_COMMUNITY): Payer: Self-pay

## 2023-11-04 ENCOUNTER — Ambulatory Visit (HOSPITAL_COMMUNITY)
Admission: RE | Admit: 2023-11-04 | Discharge: 2023-11-04 | Disposition: A | Discharge status: Home / Self Care | Source: Ambulatory Visit | Attending: Cardiology | Admitting: Cardiology

## 2023-11-04 VITALS — BP 150/88 | HR 60 | Wt 248.8 lb

## 2023-11-04 DIAGNOSIS — I110 Hypertensive heart disease with heart failure: Secondary | ICD-10-CM | POA: Insufficient documentation

## 2023-11-04 DIAGNOSIS — I5032 Chronic diastolic (congestive) heart failure: Secondary | ICD-10-CM | POA: Diagnosis present

## 2023-11-04 DIAGNOSIS — I272 Pulmonary hypertension, unspecified: Secondary | ICD-10-CM | POA: Insufficient documentation

## 2023-11-04 DIAGNOSIS — R0789 Other chest pain: Secondary | ICD-10-CM | POA: Insufficient documentation

## 2023-11-04 DIAGNOSIS — I2721 Secondary pulmonary arterial hypertension: Secondary | ICD-10-CM | POA: Diagnosis not present

## 2023-11-04 DIAGNOSIS — I471 Supraventricular tachycardia, unspecified: Secondary | ICD-10-CM

## 2023-11-04 DIAGNOSIS — I50810 Right heart failure, unspecified: Secondary | ICD-10-CM | POA: Diagnosis not present

## 2023-11-04 DIAGNOSIS — G4733 Obstructive sleep apnea (adult) (pediatric): Secondary | ICD-10-CM | POA: Diagnosis not present

## 2023-11-04 DIAGNOSIS — Z79899 Other long term (current) drug therapy: Secondary | ICD-10-CM | POA: Diagnosis not present

## 2023-11-04 DIAGNOSIS — R809 Proteinuria, unspecified: Secondary | ICD-10-CM

## 2023-11-04 DIAGNOSIS — Z87891 Personal history of nicotine dependence: Secondary | ICD-10-CM | POA: Diagnosis not present

## 2023-11-04 DIAGNOSIS — R55 Syncope and collapse: Secondary | ICD-10-CM | POA: Insufficient documentation

## 2023-11-04 DIAGNOSIS — K766 Portal hypertension: Secondary | ICD-10-CM

## 2023-11-04 DIAGNOSIS — Z9981 Dependence on supplemental oxygen: Secondary | ICD-10-CM | POA: Diagnosis not present

## 2023-11-04 DIAGNOSIS — I159 Secondary hypertension, unspecified: Secondary | ICD-10-CM

## 2023-11-04 DIAGNOSIS — Z6841 Body Mass Index (BMI) 40.0 and over, adult: Secondary | ICD-10-CM | POA: Diagnosis not present

## 2023-11-04 DIAGNOSIS — Z86711 Personal history of pulmonary embolism: Secondary | ICD-10-CM | POA: Diagnosis not present

## 2023-11-04 DIAGNOSIS — Z7985 Long-term (current) use of injectable non-insulin antidiabetic drugs: Secondary | ICD-10-CM | POA: Insufficient documentation

## 2023-11-04 DIAGNOSIS — I493 Ventricular premature depolarization: Secondary | ICD-10-CM | POA: Diagnosis not present

## 2023-11-04 DIAGNOSIS — Z853 Personal history of malignant neoplasm of breast: Secondary | ICD-10-CM | POA: Insufficient documentation

## 2023-11-04 DIAGNOSIS — Z9189 Other specified personal risk factors, not elsewhere classified: Secondary | ICD-10-CM

## 2023-11-04 LAB — BASIC METABOLIC PANEL WITH GFR
Anion gap: 10 (ref 5–15)
BUN: 12 mg/dL (ref 6–20)
CO2: 29 mmol/L (ref 22–32)
Calcium: 9.7 mg/dL (ref 8.9–10.3)
Chloride: 101 mmol/L (ref 98–111)
Creatinine, Ser: 0.77 mg/dL (ref 0.44–1.00)
GFR, Estimated: >60 mL/min (ref >60)
Glucose, Bld: 91 mg/dL (ref 70–99)
Potassium: 4 mmol/L (ref 3.5–5.1)
Sodium: 140 mmol/L (ref 135–145)

## 2023-11-04 LAB — BRAIN NATRIURETIC PEPTIDE: B Natriuretic Peptide: 48.9 pg/mL (ref 0.0–100.0)

## 2023-11-04 LAB — MAGNESIUM: Magnesium: 1.6 mg/dL — ABNORMAL LOW (ref 1.7–2.4)

## 2023-11-04 MED ORDER — MAGNESIUM OXIDE -MG SUPPLEMENT 200 MG PO CHEW
200.0000 mg | CHEWABLE_TABLET | Freq: Every day | ORAL | 3 refills | Status: DC
  Filled 2023-11-04 – 2023-11-17 (×2): qty 30, fill #0

## 2023-11-04 MED ORDER — TELMISARTAN 40 MG PO TABS
40.0000 mg | ORAL_TABLET | Freq: Every day | ORAL | 3 refills | Status: DC
  Filled 2023-11-04: qty 90, 90d supply, fill #0

## 2023-11-04 NOTE — Patient Instructions (Signed)
 STOP Losartan .  START Telmisartan  40 mg daily.  Labs done today, your results will be available in MyChart, we will contact you for abnormal readings.  Your provider has recommended that  you wear a Zio Patch for 14 days.  This monitor will record your heart rhythm for our review.  IF you have any symptoms while wearing the monitor please press the button.  If you have any issues with the patch or you notice a red or orange light on it please call the company at [phone].  Once you remove the patch please mail it back to the company as soon as possible so we can get the results.  Your physician recommends that you schedule a follow-up appointment in: 1 month.  If you have any questions or concerns before your next appointment please send us  a message through [place] or call our office at [phone].    TO LEAVE A MESSAGE FOR THE NURSE SELECT OPTION 2, PLEASE LEAVE A MESSAGE INCLUDING: YOUR NAME DATE OF BIRTH CALL BACK NUMBER REASON FOR CALL**this is important as we prioritize the call backs  YOU WILL RECEIVE A CALL BACK THE SAME DAY AS LONG AS YOU CALL BEFORE 4:00 PM  At the [facility], you and your health needs are our priority. As part of our continuing mission to provide you with exceptional heart care, we have created designated Provider Care Teams. These Care Teams include your primary Cardiologist (physician) and Advanced Practice Providers (APPs- Physician Assistants and Nurse Practitioners) who all work together to provide you with the care you need, when you need it.   You may see any of the following providers on your designated Care Team at your next follow up: Dr Toribio Fuel Dr Ezra Shuck Dr. Ria Commander Dr. Morene Brownie Amy Lenetta, NP Caffie Shed, [place] [facility] [place], [place] Beckey Coe, NP [place] Lee, NP Ellouise Class, NP Tinnie Redman, PharmD Jaun Bash, PharmD   Please be sure to bring in all your medications  bottles to every appointment.    Thank you for choosing [facility]

## 2023-11-04 NOTE — Progress Notes (Signed)
   11/04/2023  Patient ID: [name], female   DOB: 1966-02-16, 58 y.o.   MRN: [id number]  Patient was called to follow up on her lipids. HIPAA identifiers were obtained.  Patient said she wanted to continue to work on her cholesterol by decreasing fast foods and protein shakes versus starting statin therapy.  She was prescribed Atorvastatin  but did not take it as prescribed and then eventually discontinued it.  She also requested a refill on Tussionex.  Spoke with her PCP and she will need to be seen.  Patient's upcoming  PCP appointment is 02/14/24   Clinical ASCVD: The 10-year ASCVD risk score (Arnett DK, et al., 2019) is: 29.6%   Values used to calculate the score:     Age: 89 years     Clincally relevant sex: Female     Is Non-Hispanic African American: Yes     Diabetic: Yes     Tobacco smoker: No     Systolic Blood Pressure: 150 mmHg     Is BP treated: Yes     HDL Cholesterol: 43 mg/dL     Total Cholesterol: 248 mg/dL    Due to elevated ASCVD risk, statin therapy is needed.  Plan:  Will send note to Provider prior to the November 17,2025 appointment to bring up statin therapy again.   Cassius DOROTHA Brought, PharmD, BCACP Clinical Pharmacist [phone]

## 2023-11-04 NOTE — Progress Notes (Signed)
 ReDS Vest / Clip - 11/04/23 1200       ReDS Vest / Clip   Station Marker B    Ruler Value 37    ReDS Value Range Low volume    ReDS Actual Value 33

## 2023-11-04 NOTE — Progress Notes (Signed)
 [facility] CLIN[facility] NOTE  Primary Care: Georgina Speaks, FNP Primary Cardiologist: Dr. Lonni HF Cardioliogist: Dr. Cherrie  HPI: [name] is a 58 y.o.female with morbid obesity, pulmonary hypertension due iPAH (followed by Dr. Janie at [facility]), RV failure, OSA/OHS on CPAP, breast CA, previous PE.  She is followed closely with Dr. Janie since 2017 for severe IPAH. Initially started on on Remodulin  pump, sildenafil  40 tid, macintentan 10 and sotatercept.  Echo 8/22 LVEF 60-65% RV moderately dilated and HK. TR jet inadequate to estimate RVSP.   Has not been seen in [facility] Clinic since 12/2022.  Recent [facility] Cardiac Studies Reviewed: Echo (6/25): EF 55% with mild/mod reduced RV, mild worsening of RV from prior 1/25 RHC (2/25): RA 2, PA 40/22 (29), PCW 5, CO/CI 5.2/2.5 (td) and 5.1/2.4 (f), PA sat 65%, PVR 4.6 WU  RHC (4/23): RHC 06/2021: RA 8, PA 64/30 (41), PCW 12, CO/CI 6.6/3.0 (td) and 5.6/2.6 (f) PA sat 61%, PVR 4.4 (td)   Last seen by Dr. Janie 09/24/23. She had recently been transisioned from IV Treprostinil  to PO 3/25. However, has been struggling with GI side effects and dose has been needing titration down. Titrated down again to 8.5 mg tid at last visit. Had been doing well from a cardiopulmonary standpoint. Next follow up in October.  She returns today for heart failure follow up. Overall feeling okay. NYHA II. On home O2. Enrolled in pulmonary rehab to assist with weaning O2. Reports that this morning had a near-syncopal event lying in bed, felt short of breath and dizzy. Reports that she has had new atypical chest pain over the past week associated with generalized body and leg cramping, felt this was due to her electrolytes being low, so took over the counter potassium and magenesium. Chest pain has resolved and was not associated with activity. Able to perform ADLs. Appetite okay. SBP at home runs in 140-160s. Compliant with all medications.  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   Menorrhagia    secondary to uterine fibroids   OSA (obstructive sleep apnea)    07/25/13 HST AHI 33/hr, severe hypoxemia O2 min 42% and 95% of the time <89%   PE (pulmonary embolism) 2014   bilateral   Prediabetes    Pulmonary artery hypertension (HCC)    Pulmonary nodule    (5mm on loeft lower lobe) found on CT scan July 2014, repeat scan Jan 2015 showed less than 4mm   Right ovarian cyst    noted 09/2012    S/P TAH (total abdominal hysterectomy) 06/07/2013   Sleep apnea    SOB (shortness of breath) on exertion    Stomach ulcer    Trichomoniasis    05/2011    Vitamin D  deficiency     Current Outpatient Medications  Medication Sig Dispense Refill   acetaminophen  (TYLENOL ) 500 MG tablet Take 1,000 mg by mouth every 6 (six) hours as needed for moderate pain or headache.  albuterol  (ACCUNEB ) 1.25 MG/3ML nebulizer solution Take 3 mLs (1.25 mg total) by nebulization every 6 (six) hours as needed for wheezing. 360 mL 12   albuterol  (PROAIR  HFA) 108 (90 Base) MCG/ACT inhaler Inhale 1-2 puffs into the lungs every 6 (six) hours as needed for wheezing or shortness of breath. 18 g 11   Azelastine  HCl 137 MCG/SPRAY SOLN PLACE 1 SPRAY INTO BOTH NOSTRILS 2 (TWO) TIMES DAILY. USE IN EACH NOSTRIL AS DIRECTED 30 mL 4   chlorpheniramine-HYDROcodone  (TUSSIONEX) 10-8 MG/5ML Take 5 mLs by mouth every 12 (twelve) hours as needed for cough. 115 mL 0   clotrimazole -betamethasone  (LOTRISONE ) cream Apply 1 application topically 2 (two) times daily. Prn leg dermatitis  45 g 0   Colchicine  (MITIGARE ) 0.6 MG CAPS Take 1 capsule by mouth as needed. As needed gout flare max # of pills 2 in 24 hours 30 capsule 11   fluticasone  (FLONASE ) 50 MCG/ACT nasal spray Place into both nostrils daily as needed for allergies or rhinitis.     Fluticasone -Salmeterol (ADVAIR  HFA IN) Inhale 1 puff into the lungs as needed.     ibuprofen  (ADVIL ) 800 MG tablet Take 1 tablet (800 mg total) by mouth every 8 (eight) hours as needed. 90 tablet 1   linaclotide  (LINZESS ) 72 MCG capsule Take 1 capsule (72 mcg total) by mouth daily before breakfast. 90 capsule 1   loratadine  (CLARITIN ) 10 MG tablet Take 1 tablet (10 mg total) by mouth daily. 90 tablet 3   Magnesium  Cl-Calcium  Carbonate (SLOW MAGNESIUM /CALCIUM ) 70-117 MG TBEC Take 2 tablets by mouth in the morning and at bedtime.     metoprolol  tartrate (LOPRESSOR ) 25 MG tablet TAKE 1 TABLET BY MOUTH TWICE A DAY 180 tablet 3   Multiple Vitamin (MULTIVITAMIN) tablet Take 1 tablet by mouth daily.     omeprazole  (PRILOSEC) 40 MG capsule Take 40 mg by mouth as needed.     OPSUMIT  10 MG TABS Take 10 mg by mouth daily.  8   ORENITRAM  0.25 MG TBCR Take 0.5 mg by mouth.  Take 2 tablets (0.5 mg total) by mouth every eight (8) hours. Take along with 5 mg tablet and four 1 mg tablets, total 8.5 mg every 8 hours     ORENITRAM  1 MG TBCR Take by mouth.     ORENITRAM  5 MG TBCR Take by mouth.     Polyethyl Glycol-Propyl Glycol (SYSTANE OP) Place 1 drop into both eyes 3 (three) times daily as needed (dry/irritated eyes.).     Potassium Chloride  ER 20 MEQ TBCR Take 1 tablet by mouth as needed.     Semaglutide , 2 MG/DOSE, (OZEMPIC , 2 MG/DOSE,) 8 MG/3ML SOPN Inject 2 mg into the skin once a week. 3 mL 1   sildenafil  (REVATIO ) 20 MG tablet Take 1-2 tablets (20-40 mg total) by mouth 3 (three) times daily. As of 07/03/20 taking 20 mg tid per [facility] pul[facility] Dr. LEvarge 10 tablet 0   spironolactone  (ALDACTONE ) 50 MG tablet Take 1 tablet (50 mg total) by mouth daily. 90 tablet  3   telmisartan  (MICARDIS ) 40 MG tablet Take 1 tablet (40 mg total) by mouth daily. 90 tablet 3   torsemide  (DEMADEX ) 20 MG tablet Take 20 mg by mouth daily as needed.     triamcinolone  cream (KENALOG ) 0.1 % Apply 1 application topically daily as needed (leg discoloration). 454 g 2   Vitamin D , Ergocalciferol , (DRISDOL ) 1.25 MG (50000 UNIT) CAPS capsule Take 1 capsule (50,000 Units total) by mouth every 7 (seven)  days. *Need office visit for refills* 5 capsule 0   WINREVAIR 2 x 45 MG subcutaneous injection Inject into the skin.     rivaroxaban  (XARELTO ) 20 MG TABS tablet Take 20 mg by mouth daily with supper.     No current facility-administered medications for this encounter.   Facility-Administered Medications Ordered in Other Encounters  Medication Dose Route Frequency Provider Last Rate Last Admin   heparin  lock flush 100 unit/mL  500 Units Intracatheter Once PRN Odean Potts, MD       sodium chloride  flush (NS) 0.9 % injection 10 mL  10 mL Intracatheter PRN Odean Potts, MD        Allergies  Allergen Reactions   Ampicillin Other (See Comments)    Severe abdominal pain, dizziness (penicillin is okay)   Amoxicillin Other (See Comments)    LIGHT-HEADED and CLOSE TO PASSING OUT  AND ABDOMINAL PAIN Has patient had a PCN reaction causing immediate rash, facial/tongue/throat swelling, SOB or lightheadedness with hypotension: No Has patient had a PCN reaction causing severe rash involving mucus membranes or skin necrosis: No Has patient had a PCN reaction that required hospitalization No Has patient had a PCN reaction occurring within the last 10 years: No If all of the above answers are NO, then may proceed with Cephalosporin use.   Bismuth -Containing Compounds    Indomethacin  Other (See Comments)    Gastro irritation   Nsaids     GI ulcers   Penicillins    Sulfasalazine    Metronidazole  Nausea And Vomiting   Sulfa Antibiotics Other (See Comments) and Rash    Very bad yeast  infection      Social History   Socioeconomic History   Marital status: Single    Spouse name: Not on file   Number of children: 1   Years of education: Not on file   Highest education level: Some college, no degree  Occupational History   Occupation: Film/video editor, pharmacy tech  Tobacco Use   Smoking status: Former    Current packs/day: 0.00    Average packs/day: 0.3 packs/day for 10.0 years (3.3 ttl pk-yrs)    Types: Cigarettes    Start date: 10/03/2001    Quit date: 10/04/2011    Years since quitting: 12.0   Smokeless tobacco: Never   Tobacco comments:    No longer smoking cigarettes  Vaping Use   Vaping status: Never Used  Substance and Sexual Activity   Alcohol use: Yes    Alcohol/week: 0.0 standard drinks of alcohol    Comment: social   Drug use: No   Sexual activity: Not Currently  Other Topics Concern   Not on file  Social History Narrative   Single, lives alone    Lives in [place]    worked Fish farm manager at [employer]    Occupation: Programmer, 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 [employer]   As of 2022 on disability due to pulm artery htn       Children: boys   Grew up in [place]   Social Drivers of [employer] Strain: Low Risk  (09/14/2023)   Overall Financial Resource Strain (CARDIA)    Difficulty of Paying Living Expenses: Not hard at all  Food Insecurity: No Food Insecurity (09/14/2023)   Hunger Vital Sign    Worried About Running Out of Food in the Last Year: Never true    Ran Out of Food in the Last Year: Never true  Transportation Needs: No Transportation Needs (09/14/2023)  PRAPARE - Administrator, Civil Service (Medical): No    Lack of Transportation (Non-Medical): No  Physical Activity: Sufficiently Active (09/08/2023)   Exercise Vital Sign    Days of Exercise per Week: 3 days    Minutes of Exercise per Session: 60 min  Stress: Stress Concern Present (09/08/2023)   [employer] of Occupational Health - Occupational Stress  Questionnaire    Feeling of Stress : To some extent  Social Connections: Moderately Integrated (09/08/2023)   Social Connection and Isolation Panel    Frequency of Communication with Friends and Family: More than three times a week    Frequency of Social Gatherings with Friends and Family: More than three times a week    Attends Religious Services: More than 4 times per year    Active Member of [employer] or Organizations: Yes    Attends Banker Meetings: More than 4 times per year    Marital Status: Never married  Intimate Partner Violence: Not At Risk (09/14/2023)   Humiliation, Afraid, Rape, and Kick questionnaire    Fear of Current or Ex-Partner: No    Emotionally Abused: No    Physically Abused: No    Sexually Abused: No    Family History  Problem Relation Age of Onset   Hypertension Mother    Kidney disease Mother    Heart disease Mother    Alcoholism Mother    Hypertension Father    Heart disease Father    Stroke Sister    Hypertension Brother    Breast cancer Maternal Grandmother        died at 72   Breast cancer Paternal Grandmother    Cancer Maternal Aunt        unknown form   Breast cancer Paternal Aunt    Breast cancer Paternal Aunt    Breast cancer Cousin        pat first cousin dx in her 61s   Colon cancer Other 80       MGMs brother   Cancer Other        breast ca in GM   Cancer Other        g uncle colon or stomach ca   Blood pressure (!) 150/88, pulse 60, weight 112.9 kg (248 lb 12.8 oz), last menstrual period 05/11/2013, SpO2 95%.   Wt Readings from Last 3 Encounters:  11/04/23 112.9 kg (248 lb 12.8 oz)  11/03/23 112.3 kg (247 lb 9.2 oz)  10/26/23 109.3 kg (241 lb)   PHYSICAL EXAM: General: Well appearing. No distress on RA Cardiac: JVP flat. S1 and S2 present. No murmurs or rub. Extremities: Warm and dry.  No peripheral edema.  Neuro: Alert and oriented x3. Affect pleasant.   ECG (personally reviewed): NSR 84 bpm, frequent  PVCs  ASSESSMENT & PLAN: 1. 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 H/o PE but pulmonary angiography 2017 without chronic PE - RHC 2/25 with low filling pressures with slightly lower CI/CO and high PVR from previous (RA 2, PA 40/22 (29), PCW 5, CO/CI 5.2/2.5 (td) and 5.1/2.4 (f), PA sat 65%, PVR 4.6 w) - Recent echo 6/25 showed EF 55% with mild/mod reduced RV, mild worsening of RV from prior 1/25 - Stable NYHA II. Euvolemic on exam. BMET/BNP - continue lopressor  25 mg daily - continue torsemide  60 mg daily + 40 mEq daily - switch losartan  to telmisartan  40 mg daily - continue spiro 50 mg daily (pt reports taking, however  last [facility] note says that this was stopped) - Home O2 requirement 4-10L during day and 10L by CPAP at night; started pulm rehab to assist with O2 wean. - Followed closely at [facility] on therapy prev on IV treprostinil  (started 10/20). Now transitioned to PO Treprostinil  (3/25). Continues on sildenafil  40 tid, sotatercept, and macitentan  10 daily. Defer PAH management to Dr. Janie and [facility] team.  - GI upset with PO treprostinil , has been titration down  2. OSA - continue O2 and BiPAP  3. Obesity - Body mass index is 41.4 kg/m. - On Ozempic   4. PVCs - Zio 7/23 2 runs of SVT and NSVT. PVCs 2.4%  - episode of near syncope this morning while lying down, suspect arrhythmia - multiple PVCs on ECG and on auscultation - continue metoprolol  - place Zio AT 2 week today   5. HTN - BP has been elevated at home and in clinic - switch losartan  to telmisartan  40 mg daily  6. H/o VTE - H/o chronic PE. Likely due to lining stasis from [facility] as opposed to true CTEPH - Xarelto  on hold with recent GIB, likely will need to resume once bleeding stable  7. Atypical Chest Pain - having aching in chest over the past week, unrelated to activity or rest; reports has been having other body cramps  - per pt resolved with OTC potassium and Mg supp - does not sound ischemic in  nature - BMET/Mg today; instructed to hold off on OTC supplementation.  Follow up in 1 month with APP  [place] Breon Rehm, NP  4:06 PM

## 2023-11-05 ENCOUNTER — Ambulatory Visit

## 2023-11-05 ENCOUNTER — Other Ambulatory Visit (HOSPITAL_COMMUNITY): Payer: Self-pay

## 2023-11-05 VITALS — BP 140/90 | HR 80 | Temp 98.5°F | Ht 65.0 in | Wt 248.0 lb

## 2023-11-05 DIAGNOSIS — I110 Hypertensive heart disease with heart failure: Secondary | ICD-10-CM

## 2023-11-05 NOTE — Progress Notes (Signed)
 Patient is in office today for a nurse visit for Blood Pressure Check. Patient currently taking Telmisartan  40mg  PM , spironolactone  50mg  AM, metoprolol  tartrate 25mg  PM. Patient blood pressure was 140/80, Patient No chest pain, No shortness of breath, No dyspnea on exertion, No orthopnea, No paroxysmal nocturnal dyspnea, No edema, No palpitations, No syncope Patient reports she just started the Telmisartan  yesterday. She hasn't taken her BP medication this morning.  BP Readings from Last 3 Encounters:  11/05/23 (!) 140/80  11/04/23 (!) 150/88  11/03/23 (!) 156/100   PER PROVIDER- CONTINUE WITH CURRENT MEDS. Follow up with cardiology in 1 month.

## 2023-11-08 ENCOUNTER — Ambulatory Visit (INDEPENDENT_AMBULATORY_CARE_PROVIDER_SITE_OTHER): Admitting: Physician Assistant

## 2023-11-10 ENCOUNTER — Telehealth (HOSPITAL_COMMUNITY): Payer: Self-pay

## 2023-11-10 NOTE — Telephone Encounter (Signed)
 Called [name] to check on her since her BP meds have been modified. [name] is still reporting high BP's, 160/90. Feels this is due to stress.

## 2023-11-11 ENCOUNTER — Encounter (HOSPITAL_COMMUNITY)
Admission: RE | Admit: 2023-11-11 | Discharge: 2023-11-11 | Disposition: A | Discharge status: Home / Self Care | Source: Ambulatory Visit | Attending: Pulmonary Disease | Admitting: Pulmonary Disease

## 2023-11-11 DIAGNOSIS — I2721 Secondary pulmonary arterial hypertension: Secondary | ICD-10-CM

## 2023-11-11 NOTE — Progress Notes (Signed)
 Pulmonary Individual Treatment Plan  Patient Details  Name: [name] MRN: [id number] Date of Birth: 1966/03/25 Referring Provider:   Conrad Ports Pulmonary Rehab Walk Test from 11/03/2023 in [facility] for Heart, Vascular, & Lung Health  Referring Provider Levarge    Initial Encounter Date:  Flowsheet Row Pulmonary Rehab Walk Test from 11/03/2023 in [facility] for Heart, Vascular, & Lung Health  Date 11/03/23    Visit Diagnosis: PAH (pulmonary artery hypertension) (HCC)  Patient's Home Medications on Admission:   Current Outpatient Medications:    acetaminophen  (TYLENOL ) 500 MG tablet, Take 1,000 mg by mouth every 6 (six) hours as needed for moderate pain or headache. , Disp: , Rfl:    albuterol  (ACCUNEB ) 1.25 MG/3ML nebulizer solution, Take 3 mLs (1.25 mg total) by nebulization every 6 (six) hours as needed for wheezing., Disp: 360 mL, Rfl: 12   albuterol  (PROAIR  HFA) 108 (90 Base) MCG/ACT inhaler, Inhale 1-2 puffs into the lungs every 6 (six) hours as needed for wheezing or shortness of breath., Disp: 18 g, Rfl: 11   Azelastine  HCl 137 MCG/SPRAY SOLN, PLACE 1 SPRAY INTO BOTH NOSTRILS 2 (TWO) TIMES DAILY. USE IN EACH NOSTRIL AS DIRECTED, Disp: 30 mL, Rfl: 4   chlorpheniramine-HYDROcodone  (TUSSIONEX) 10-8 MG/5ML, Take 5 mLs by mouth every 12 (twelve) hours as needed for cough., Disp: 115 mL, Rfl: 0   clotrimazole -betamethasone  (LOTRISONE ) cream, Apply 1 application topically 2 (two) times daily. Prn leg dermatitis, Disp: 45 g, Rfl: 0   Colchicine  (MITIGARE ) 0.6 MG CAPS, Take 1 capsule by mouth as needed. As needed gout flare max # of pills 2 in 24 hours, Disp: 30 capsule, Rfl: 11   fluticasone  (FLONASE ) 50 MCG/ACT nasal spray, Place into both nostrils daily as needed for allergies or rhinitis., Disp: , Rfl:    Fluticasone -Salmeterol (ADVAIR  HFA IN), Inhale 1 puff into the lungs as needed., Disp: , Rfl:    ibuprofen  (ADVIL ) 800 MG  tablet, Take 1 tablet (800 mg total) by mouth every 8 (eight) hours as needed., Disp: 90 tablet, Rfl: 1   linaclotide  (LINZESS ) 72 MCG capsule, Take 1 capsule (72 mcg total) by mouth daily before breakfast., Disp: 90 capsule, Rfl: 1   loratadine  (CLARITIN ) 10 MG tablet, Take 1 tablet (10 mg total) by mouth daily., Disp: 90 tablet, Rfl: 3   Magnesium  Cl-Calcium  Carbonate (SLOW MAGNESIUM /CALCIUM ) 70-117 MG TBEC, Take 2 tablets by mouth in the morning and at bedtime., Disp: , Rfl:    metoprolol  tartrate (LOPRESSOR ) 25 MG tablet, TAKE 1 TABLET BY MOUTH TWICE A DAY, Disp: 180 tablet, Rfl: 3   Multiple Vitamin (MULTIVITAMIN) tablet, Take 1 tablet by mouth daily., Disp: , Rfl:    omeprazole  (PRILOSEC) 40 MG capsule, Take 40 mg by mouth as needed., Disp: , Rfl:    OPSUMIT  10 MG TABS, Take 10 mg by mouth daily., Disp: , Rfl: 8   ORENITRAM  0.25 MG TBCR, Take 0.5 mg by mouth.  Take 2 tablets (0.5 mg total) by mouth every eight (8) hours. Take along with 5 mg tablet and four 1 mg tablets, total 8.5 mg every 8 hours, Disp: , Rfl:    ORENITRAM  1 MG TBCR, Take by mouth., Disp: , Rfl:    ORENITRAM  5 MG TBCR, Take by mouth., Disp: , Rfl:    Polyethyl Glycol-Propyl Glycol (SYSTANE OP), Place 1 drop into both eyes 3 (three) times daily as needed (dry/irritated eyes.)., Disp: , Rfl:    Semaglutide , 2  MG/DOSE, (OZEMPIC , 2 MG/DOSE,) 8 MG/3ML SOPN, Inject 2 mg into the skin once a week., Disp: 3 mL, Rfl: 1   sildenafil  (REVATIO ) 20 MG tablet, Take 1-2 tablets (20-40 mg total) by mouth 3 (three) times daily. As of 07/03/20 taking 20 mg tid per [facility] pulm Dr. Janie, Disp: 10 tablet, Rfl: 0   spironolactone  (ALDACTONE ) 50 MG tablet, Take 1 tablet (50 mg total) by mouth daily., Disp: 90 tablet, Rfl: 3   torsemide  (DEMADEX ) 20 MG tablet, Take 20 mg by mouth daily as needed., Disp: , Rfl:    triamcinolone  cream (KENALOG ) 0.1 %, Apply 1 application topically daily as needed (leg discoloration)., Disp: 454 g, Rfl: 2   Vitamin D ,  Ergocalciferol , (DRISDOL ) 1.25 MG (50000 UNIT) CAPS capsule, Take 1 capsule (50,000 Units total) by mouth every 7 (seven) days. *Need office visit for refills*, Disp: 5 capsule, Rfl: 0   WINREVAIR 2 x 45 MG subcutaneous injection, Inject into the skin., Disp: , Rfl:    Magnesium  Oxide -Mg Supplement 200 MG CHEW, Chew 1 gummy by mouth daily., Disp: 30 tablet, Rfl: 3   Potassium Chloride  ER 20 MEQ TBCR, Take 1 tablet by mouth as needed., Disp: , Rfl:    rivaroxaban  (XARELTO ) 20 MG TABS tablet, Take 20 mg by mouth daily with supper., Disp: , Rfl:    telmisartan  (MICARDIS ) 40 MG tablet, Take 1 tablet (40 mg total) by mouth daily., Disp: 90 tablet, Rfl: 3 No current facility-administered medications for this encounter.  Facility-Administered Medications Ordered in Other Encounters:    heparin  lock flush 100 unit/mL, 500 Units, Intracatheter, Once PRN, Gudena, Vinay, MD   sodium chloride  flush (NS) 0.9 % injection 10 mL, 10 mL, Intracatheter, PRN, Gudena, Vinay, MD  Past Medical History: 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   Menorrhagia    secondary to uterine fibroids   OSA (obstructive sleep apnea)    07/25/13 HST AHI 33/hr, severe hypoxemia O2 min 42% and 95% of the time <89%   PE (pulmonary embolism) 2014   bilateral   Prediabetes    Pulmonary artery hypertension (HCC)    Pulmonary nodule    (5mm on loeft lower lobe) found on CT scan July 2014, repeat scan Jan 2015 showed less than 4mm   Right ovarian cyst    noted 09/2012    S/P TAH (total abdominal hysterectomy) 06/07/2013    Sleep apnea    SOB (shortness of breath) on exertion    Stomach ulcer    Trichomoniasis    05/2011    Vitamin D  deficiency     Tobacco Use: Social History   Tobacco Use  Smoking Status Former   Current packs/day: 0.00   Average packs/day: 0.3 packs/day for 10.0 years (3.3 ttl pk-yrs)   Types: Cigarettes   Start date: 10/03/2001   Quit date: 10/04/2011   Years since quitting: 12.1  Smokeless Tobacco Never  Tobacco Comments   No longer smoking cigarettes  Labs: Review Flowsheet  More data exists      Latest Ref Rng & Units 04/30/2022 09/09/2022 11/12/2022 04/13/2023 10/19/2023  Labs for ITP Cardiac and Pulmonary Rehab  Cholestrol 100 - 199 mg/dL 819  808  - 778  751   LDL (calc) 0 - 99 mg/dL 889  879  - 857  835   HDL-C >39 mg/dL 43  49  - 56  43   Trlycerides 0 - 149 mg/dL 848  878  - 868  779   Hemoglobin A1c 4.8 - 5.6 % 5.3  5.1  5.2  5.1  5.3     Capillary Blood Glucose: Lab Results  Component Value Date   GLUCAP 119 (H) 09/09/2017   GLUCAP 113 (H) 09/09/2017   GLUCAP 95 09/02/2017   GLUCAP 108 (H) 07/27/2016   GLUCAP 116 (H) 07/27/2016     Pulmonary Assessment Scores:  Pulmonary Assessment Scores     Row Name 11/03/23 1149         ADL UCSD   ADL Phase Entry     SOB Score total 56       CAT Score   CAT Score 21       mMRC Score   mMRC Score 4       UCSD: Self-administered rating of dyspnea associated with activities of daily living (ADLs) 6-point scale (0 = not at all to 5 = maximal or unable to do because of breathlessness)  Scoring Scores range from 0 to 120.  Minimally important difference is 5 units  CAT: CAT can identify the health impairment of COPD patients and is better correlated with disease progression.  CAT has a scoring range of zero to 40. The CAT score is classified into four groups of low (less than 10), medium (10 - 20), high (21-30) and very high (31-40) based on the impact level of disease on health status. A CAT score over  10 suggests significant symptoms.  A worsening CAT score could be explained by an exacerbation, poor medication adherence, poor inhaler technique, or progression of COPD or comorbid conditions.  CAT MCID is 2 points  mMRC: mMRC (Modified Medical Research Council) Dyspnea Scale is used to assess the degree of baseline functional disability in patients of respiratory disease due to dyspnea. No minimal important difference is established. A decrease in score of 1 point or greater is considered a positive change.   Pulmonary Function Assessment:  Pulmonary Function Assessment - 11/03/23 1122       Breath   Bilateral Breath Sounds Clear;Decreased    Shortness of Breath Yes;Limiting activity          Exercise Target Goals: Exercise Program Goal: Individual exercise prescription set using results from initial 6 min walk test and THRR while considering  patient's activity barriers and safety.   Exercise Prescription Goal: Initial exercise prescription builds to 30-45 minutes a day of aerobic activity, 2-3 days per week.  Home exercise guidelines will be given to patient during program as part of exercise prescription that the participant will acknowledge.  Activity Barriers & Risk Stratification:  Activity Barriers & Cardiac Risk Stratification - 11/03/23 1108       Activity Barriers & Cardiac Risk Stratification   Activity Barriers Shortness of Breath;Muscular Weakness;Deconditioning    Cardiac Risk Stratification Low          6 Minute Walk:  6 Minute Walk     Row Name 11/11/23 1205  6 Minute Walk   Phase Initial     Distance 1020 feet     Walk Time 6 minutes     # of Rest Breaks 0     MPH 1.93     METS 2.6     RPE 13     Perceived Dyspnea  1     VO2 Peak 9.1     Symptoms No     Resting HR 79 bpm     Resting BP 150/90     Resting Oxygen  Saturation  94 %     Exercise Oxygen  Saturation  during 6 min walk 87 %     Max Ex. HR 117 bpm     Max Ex. BP 142/80      2 Minute Post BP 140/80       Interval HR   1 Minute HR 104     2 Minute HR 103     3 Minute HR 103     4 Minute HR 117     5 Minute HR 114     6 Minute HR 114     2 Minute Post HR 83     Interval Heart Rate? Yes       Interval Oxygen    Interval Oxygen ? Yes     Baseline Oxygen  Saturation % 94 %     1 Minute Oxygen  Saturation % 91 %     1 Minute Liters of Oxygen  0 L     2 Minute Oxygen  Saturation % 90 %  87% at 1:26     2 Minute Liters of Oxygen  0 L  increased to 2L     3 Minute Oxygen  Saturation % 89 %     3 Minute Liters of Oxygen  2 L  increased to 4L     4 Minute Oxygen  Saturation % 90 %     4 Minute Liters of Oxygen  4 L     5 Minute Oxygen  Saturation % 91 %     5 Minute Liters of Oxygen  4 L     6 Minute Oxygen  Saturation % 90 %     6 Minute Liters of Oxygen  4 L     2 Minute Post Oxygen  Saturation % 94 %     2 Minute Post Liters of Oxygen  4 L        Oxygen  Initial Assessment:  Oxygen  Initial Assessment - 11/11/23 1208       Initial 6 min Walk   Oxygen  Used Continuous    Liters per minute 4          Oxygen  Re-Evaluation:  Oxygen  Re-Evaluation     Row Name 11/11/23 1208             Program Oxygen  Prescription   Program Oxygen  Prescription Continuous       Liters per minute 4         Home Oxygen    Home Oxygen  Device Home Concentrator  d tanks with pulse regulator       Sleep Oxygen  Prescription BiPAP       Liters per minute 6       Home Exercise Oxygen  Prescription Pulsed  d tank with pulse regulator       Liters per minute 4       Home Resting Oxygen  Prescription Continuous       Liters per minute 2       Compliance with Home Oxygen  Use Yes  Goals/Expected Outcomes   Short Term Goals To learn and exhibit compliance with exercise, home and travel O2 prescription;To learn and understand importance of monitoring SPO2 with pulse oximeter and demonstrate accurate use of the pulse oximeter.;To learn and understand importance of maintaining oxygen   saturations>88%;To learn and demonstrate proper pursed lip breathing techniques or other breathing techniques. ;To learn and demonstrate proper use of respiratory medications       Long  Term Goals Exhibits compliance with exercise, home  and travel O2 prescription;Maintenance of O2 saturations>88%;Compliance with respiratory medication;Verbalizes importance of monitoring SPO2 with pulse oximeter and return demonstration;Exhibits proper breathing techniques, such as pursed lip breathing or other method taught during program session;Demonstrates proper use of MDI's       Goals/Expected Outcomes Compliance and understanding of oxygen  saturation monitoring and breathing techniques to decrease shortness of breath.          Oxygen  Discharge (Final Oxygen  Re-Evaluation):  Oxygen  Re-Evaluation - 11/11/23 1208       Program Oxygen  Prescription   Program Oxygen  Prescription Continuous    Liters per minute 4      Home Oxygen    Home Oxygen  Device Home Concentrator   d tanks with pulse regulator   Sleep Oxygen  Prescription BiPAP    Liters per minute 6    Home Exercise Oxygen  Prescription Pulsed   d tank with pulse regulator   Liters per minute 4    Home Resting Oxygen  Prescription Continuous    Liters per minute 2    Compliance with Home Oxygen  Use Yes      Goals/Expected Outcomes   Short Term Goals To learn and exhibit compliance with exercise, home and travel O2 prescription;To learn and understand importance of monitoring SPO2 with pulse oximeter and demonstrate accurate use of the pulse oximeter.;To learn and understand importance of maintaining oxygen  saturations>88%;To learn and demonstrate proper pursed lip breathing techniques or other breathing techniques. ;To learn and demonstrate proper use of respiratory medications    Long  Term Goals Exhibits compliance with exercise, home  and travel O2 prescription;Maintenance of O2 saturations>88%;Compliance with respiratory medication;Verbalizes  importance of monitoring SPO2 with pulse oximeter and return demonstration;Exhibits proper breathing techniques, such as pursed lip breathing or other method taught during program session;Demonstrates proper use of MDI's    Goals/Expected Outcomes Compliance and understanding of oxygen  saturation monitoring and breathing techniques to decrease shortness of breath.          Initial Exercise Prescription:  Initial Exercise Prescription - 11/03/23 1200       Date of Initial Exercise RX and Referring Provider   Date 11/03/23    Referring Provider Levarge    Expected Discharge Date 02/01/24      Oxygen    Oxygen  Continuous    Liters 4    Maintain Oxygen  Saturation 88% or higher      NuStep   Level 2    SPM 80    Minutes 15    METs 1.5      Track   Minutes 15    METs 2.3      Prescription Details   Frequency (times per week) 2    Duration Progress to 30 minutes of continuous aerobic without signs/symptoms of physical distress      Intensity   THRR 40-80% of Max Heartrate 65-130    Ratings of Perceived Exertion 11-13    Perceived Dyspnea 0-4      Progression   Progression Continue to progress workloads to maintain intensity without signs/symptoms  of physical distress.      Resistance Training   Training Prescription Yes    Weight Red bands    Reps 10-15          Perform Capillary Blood Glucose checks as needed.  Exercise Prescription Changes:   Exercise Comments:   Exercise Goals and Review:   Exercise Goals     Row Name 11/03/23 1035             Exercise Goals   Increase Physical Activity Yes       Intervention Provide advice, education, support and counseling about physical activity/exercise needs.;Develop an individualized exercise prescription for aerobic and resistive training based on initial evaluation findings, risk stratification, comorbidities and participant's personal goals.       Expected Outcomes Short Term: Attend rehab on a regular  basis to increase amount of physical activity.;Long Term: Add in home exercise to make exercise part of routine and to increase amount of physical activity.;Long Term: Exercising regularly at least 3-5 days a week.       Increase Strength and Stamina Yes       Intervention Provide advice, education, support and counseling about physical activity/exercise needs.;Develop an individualized exercise prescription for aerobic and resistive training based on initial evaluation findings, risk stratification, comorbidities and participant's personal goals.       Expected Outcomes Short Term: Increase workloads from initial exercise prescription for resistance, speed, and METs.;Short Term: Perform resistance training exercises routinely during rehab and add in resistance training at home;Long Term: Improve cardiorespiratory fitness, muscular endurance and strength as measured by increased METs and functional capacity ( )       Able to understand and use rate of perceived exertion (RPE) scale Yes       Intervention Provide education and explanation on how to use RPE scale       Expected Outcomes Short Term: Able to use RPE daily in rehab to express subjective intensity level;Long Term:  Able to use RPE to guide intensity level when exercising independently       Able to understand and use Dyspnea scale Yes       Intervention Provide education and explanation on how to use Dyspnea scale       Expected Outcomes Short Term: Able to use Dyspnea scale daily in rehab to express subjective sense of shortness of breath during exertion;Long Term: Able to use Dyspnea scale to guide intensity level when exercising independently       Knowledge and understanding of Target Heart Rate Range (THRR) Yes       Intervention Provide education and explanation of THRR including how the numbers were predicted and where they are located for reference       Expected Outcomes Short Term: Able to state/look up THRR;Short Term: Able to  use daily as guideline for intensity in rehab;Long Term: Able to use THRR to govern intensity when exercising independently       Understanding of Exercise Prescription Yes       Intervention Provide education, explanation, and written materials on patient's individual exercise prescription       Expected Outcomes Short Term: Able to explain program exercise prescription;Long Term: Able to explain home exercise prescription to exercise independently          Exercise Goals Re-Evaluation :   Discharge Exercise Prescription (Final Exercise Prescription Changes):   Nutrition:  Target Goals: Understanding of nutrition guidelines, daily intake of sodium 1500mg , cholesterol 200mg , calories 30% from fat and 7%  or less from saturated fats, daily to have 5 or more servings of fruits and vegetables.  Biometrics:    Nutrition Therapy Plan and Nutrition Goals:   Nutrition Assessments:  MEDIFICTS Score Key: >=70 Need to make dietary changes  40-70 Heart Healthy Diet <= 40 Therapeutic Level Cholesterol Diet  Flowsheet Row PULMONARY REHAB OTHER RESPIRATORY from 10/17/2020 in [facility] for Heart, Vascular, & Lung Health  Picture Your Plate Total Score on Discharge 65   Picture Your Plate Scores: <59 Unhealthy dietary pattern with much room for improvement. 41-50 Dietary pattern unlikely to meet recommendations for good health and room for improvement. 51-60 More healthful dietary pattern, with some room for improvement.  >60 Healthy dietary pattern, although there may be some specific behaviors that could be improved.    Nutrition Goals Re-Evaluation:   Nutrition Goals Discharge (Final Nutrition Goals Re-Evaluation):   Psychosocial: Target Goals: Acknowledge presence or absence of significant depression and/or stress, maximize coping skills, provide positive support system. Participant is able to verbalize types and ability to use techniques and skills needed  for reducing stress and depression.  Initial Review & Psychosocial Screening:  Initial Psych Review & Screening - 11/03/23 1100       Initial Review   Current issues with History of Depression;Current Anxiety/Panic;Current Stress Concerns    Source of Stress Concerns Chronic Illness;Family;Unable to participate in former interests or hobbies    Comments Pt is dealing with stress from her dad recently having a stroke.      Family Dynamics   Good Support System? Yes      Barriers   Psychosocial barriers to participate in program The patient should benefit from training in stress management and relaxation.;Psychosocial barriers identified (see note)      Screening Interventions   Interventions Encouraged to exercise;To provide support and resources with identified psychosocial needs    Expected Outcomes Short Term goal: Utilizing psychosocial counselor, staff and physician to assist with identification of specific Stressors or current issues interfering with healing process. Setting desired goal for each stressor or current issue identified.;Long Term Goal: Stressors or current issues are controlled or eliminated.;Short Term goal: Identification and review with participant of any Quality of Life or Depression concerns found by scoring the questionnaire.;Long Term goal: The participant improves quality of Life and PHQ9 Scores as seen by post scores and/or verbalization of changes          Quality of Life Scores:  Scores of 19 and below usually indicate a poorer quality of life in these areas.  A difference of  2-3 points is a clinically meaningful difference.  A difference of 2-3 points in the total score of the Quality of Life Index has been associated with significant improvement in overall quality of life, self-image, physical symptoms, and general health in studies assessing change in quality of life.  PHQ-9: Review Flowsheet  More data exists      11/03/2023 10/18/2023 10/06/2023  09/08/2023 04/13/2023  Depression screen PHQ 2/9  Decreased Interest 0 0 0 0 0  Down, Depressed, Hopeless 0 0 0 0 1  PHQ - 2 Score 0 0 0 0 1  Altered sleeping 2 - 0 3 2  Tired, decreased energy 1 - 0 3 1  Change in appetite 1 - 0 0 0  Feeling bad or failure about yourself  0 - 0 0 0  Trouble concentrating 0 - 0 0 0  Moving slowly or fidgety/restless 0 - 0 0 0  Suicidal thoughts 0 - 0 0 0  PHQ-9 Score 4 - 0 6 4  Difficult doing work/chores Somewhat difficult - Not difficult at all Somewhat difficult Somewhat difficult   Interpretation of Total Score  Total Score Depression Severity:  1-4 = Minimal depression, 5-9 = Mild depression, 10-14 = Moderate depression, 15-19 = Moderately severe depression, 20-27 = Severe depression   Psychosocial Evaluation and Intervention:  Psychosocial Evaluation - 11/03/23 1141       Psychosocial Evaluation & Interventions   Interventions Stress management education;Relaxation education;Encouraged to exercise with the program and follow exercise prescription    Comments Initial PHQ 2-9 score is 0/4. She states she feels more stressed than she does depressed. Pt states her dad has recently had a stroke and will be moving in with her sister. This has caused her to feel stressed. Her MD has put in a referral for a mental health specialist. She does not want to try psychotropic meds at this time. Staff will provide patient with education and techniques on ways to reduce stress.    Expected Outcomes For Malia to participate in PR with less stress.    Continue Psychosocial Services  Follow up required by staff          Psychosocial Re-Evaluation:   Psychosocial Discharge (Final Psychosocial Re-Evaluation):   Education: Education Goals: Education classes will be provided on a weekly basis, covering required topics. Participant will state understanding/return demonstration of topics presented.  Learning Barriers/Preferences:  Learning Barriers/Preferences  - 11/03/23 1105       Learning Barriers/Preferences   Learning Barriers Sight    Learning Preferences Written Material;Skilled Demonstration;Individual Instruction          Education Topics: Know Your Numbers Group instruction that is supported by a PowerPoint presentation. Instructor discusses importance of knowing and understanding resting, exercise, and post-exercise oxygen  saturation, heart rate, and blood pressure. Oxygen  saturation, heart rate, blood pressure, rating of perceived exertion, and dyspnea are reviewed along with a normal range for these values.    Exercise for the Pulmonary Patient Group instruction that is supported by a PowerPoint presentation. Instructor discusses benefits of exercise, core components of exercise, frequency, duration, and intensity of an exercise routine, importance of utilizing pulse oximetry during exercise, safety while exercising, and options of places to exercise outside of rehab.  Flowsheet Row PULMONARY REHAB OTHER RESPIRATORY from 10/17/2020 in [facility] for [facility], Vascular, & Lung [facility]  Date 10/10/20  Educator Handout    MET Level  Group instruction provided by PowerPoint, verbal discussion, and written material to support subject matter. Instructor reviews what METs are and how to increase METs.    Pulmonary Medications Verbally interactive group education provided by instructor with focus on inhaled medications and proper administration.   Anatomy and Physiology of the Respiratory System Group instruction provided by PowerPoint, verbal discussion, and written material to support subject matter. Instructor reviews respiratory cycle and anatomical components of the respiratory system and their functions. Instructor also reviews differences in obstructive and restrictive respiratory diseases with examples of each.  Flowsheet Row PULMONARY REHAB OTHER RESPIRATORY from 10/17/2020 in [facility] for [facility], V[facility], & Lung Health  Date 08/29/20  Educator Jess-H/O  Instruction Review Code 1- Verbalizes Understanding    Oxygen  Safety Group instruction provided by PowerPoint, verbal discussion, and written material to support subject matter. There is an overview of "What is Oxygen " and "Why do we need it".  Instructor also reviews how to  create a safe environment for oxygen  use, the importance of using oxygen  as prescribed, and the risks of noncompliance. There is a brief discussion on traveling with oxygen  and resources the patient may utilize. Flowsheet Row PULMONARY REHAB OTHER RESPIRATORY from 10/17/2020 in [facility] for Heart, Vascular, & Lung Health  Date 09/05/20  Educator Handout    Oxygen  Use Group instruction provided by PowerPoint, verbal discussion, and written material to discuss how supplemental oxygen  is prescribed and different types of oxygen  supply systems. Resources for more information are provided.    Breathing Techniques Group instruction that is supported by demonstration and informational handouts. Instructor discusses the benefits of pursed lip and diaphragmatic breathing and detailed demonstration on how to perform both.     Risk Factor Reduction Group instruction that is supported by a PowerPoint presentation. Instructor discusses the definition of a risk factor, different risk factors for pulmonary disease, and how the heart and lungs work together. Flowsheet Row PULMONARY REHAB OTHER RESPIRATORY from 10/17/2020 in [facility] for Heart, Vascular, & Lung Health  Date 09/19/20  Educator handout    Pulmonary Diseases Group instruction provided by PowerPoint, verbal discussion, and written material to support subject matter. Instructor gives an overview of the different type of pulmonary diseases. There is also a discussion on risk factors and symptoms as well as ways to manage the diseases.   Stress  and Energy Conservation Group instruction provided by PowerPoint, verbal discussion, and written material to support subject matter. Instructor gives an overview of stress and the impact it can have on the body. Instructor also reviews ways to reduce stress. There is also a discussion on energy conservation and ways to conserve energy throughout the day.   Warning Signs and Symptoms Group instruction provided by PowerPoint, verbal discussion, and written material to support subject matter. Instructor reviews warning signs and symptoms of stroke, heart attack, cold and flu. Instructor also reviews ways to prevent the spread of infection.   Other Education Group or individual verbal, written, or video instructions that support the educational goals of the pulmonary rehab program. Flowsheet Row PULMONARY REHAB OTHER RESPIRATORY from 10/17/2020 in [facility] for Heart, Vascular, & Lung Health  Date 10/17/20  [facility] Plate Handout]  Educator Meredith-H/O  Instruction Review Code 1- Verbalizes Understanding     Knowledge Questionnaire Score:  Knowledge Questionnaire Score - 11/03/23 1034       Knowledge Questionnaire Score   Pre Score 16/18          Core Components/Risk Factors/Patient Goals at Admission:  Personal Goals and Risk Factors at Admission - 11/03/23 1105       Core Components/Risk Factors/Patient Goals on Admission    Weight Management Yes;Weight Loss    Intervention Weight Management: Develop a combined nutrition and exercise program designed to reach desired caloric intake, while maintaining appropriate intake of nutrient and fiber, sodium and fats, and appropriate energy expenditure required for the weight goal.;Weight Management: Provide education and appropriate resources to help participant work on and attain dietary goals.;Weight Management/Obesity: Establish reasonable short term and long term weight goals.;Obesity: Provide education and appropriate  resources to help participant work on and attain dietary goals.    Admit Weight 247 lb 9.2 oz (112.3 kg)    Expected Outcomes Short Term: Continue to assess and modify interventions until short term weight is achieved;Long Term: Adherence to nutrition and physical activity/exercise program aimed toward attainment of established weight goal;Weight Loss: Understanding  of general recommendations for a balanced deficit meal plan, which promotes 1-2 lb weight loss per week and includes a negative energy balance of [phone] kcal/d;Understanding recommendations for meals to include 15-35% energy as protein, 25-35% energy from fat, 35-60% energy from carbohydrates, less than 200mg  of dietary cholesterol, 20-35 gm of total fiber daily;Understanding of distribution of calorie intake throughout the day with the consumption of 4-5 meals/snacks    Improve shortness of breath with ADL's Yes    Intervention Provide education, individualized exercise plan and daily activity instruction to help decrease symptoms of SOB with activities of daily living.    Expected Outcomes Short Term: Improve cardiorespiratory fitness to achieve a reduction of symptoms when performing ADLs;Long Term: Be able to perform more ADLs without symptoms or delay the onset of symptoms    Hypertension Yes    Intervention Provide education on lifestyle modifcations including regular physical activity/exercise, weight management, moderate sodium restriction and increased consumption of fresh fruit, vegetables, and low fat dairy, alcohol moderation, and smoking cessation.;Monitor prescription use compliance.    Expected Outcomes Short Term: Continued assessment and intervention until BP is < 140/84mm HG in hypertensive participants. < 130/1mm HG in hypertensive participants with diabetes, heart failure or chronic kidney disease.;Long Term: Maintenance of blood pressure at goal levels.    Stress Yes    Intervention Offer individual and/or small group  education and counseling on adjustment to heart disease, stress management and health-related lifestyle change. Teach and support self-help strategies.;Refer participants experiencing significant psychosocial distress to appropriate mental health specialists for further evaluation and treatment. When possible, include family members and significant others in education/counseling sessions.    Expected Outcomes Short Term: Participant demonstrates changes in health-related behavior, relaxation and other stress management skills, ability to obtain effective social support, and compliance with psychotropic medications if prescribed.;Long Term: Emotional wellbeing is indicated by absence of clinically significant psychosocial distress or social isolation.          Core Components/Risk Factors/Patient Goals Review:    Core Components/Risk Factors/Patient Goals at Discharge (Final Review):    ITP Comments:   Comments: Dr. Slater Staff is Medical Director for Pulmonary Rehab at [facility].

## 2023-11-12 NOTE — Progress Notes (Signed)
 Daily Session Note  Patient Details  Name: [name] MRN: [id number] Date of Birth: 1965-08-30 Referring Provider:   Conrad Ports Pulmonary Rehab Walk Test from 11/03/2023 in [facility] for Heart, Vascular, & Lung Health  Referring Provider Levarge    Encounter Date: 11/11/2023  Check In:  Session Check In - 11/11/23 1208       Check-In   Supervising physician immediately available to respond to emergencies [facility] MD immediately available    Physician(s) Damien Braver, NP    Location [facility]    Staff Present Ronal Levin, RN, BSN;Kaaren Nass Claudene, Neita Moats, MS, ACSM-CEP, Exercise Physiologist;Carlette Bernett, RN, BSN    Virtual Visit No    Medication changes reported     No    Fall or balance concerns reported    No    Tobacco Cessation No Change    Warm-up and Cool-down Performed as group-led instruction    Resistance Training Performed Yes    VAD Patient? No    PAD/SET Patient? No      Pain Assessment   Currently in Pain? No/denies    Multiple Pain Sites No          Capillary Blood Glucose: No results found for this or any previous visit (from the past 24 hours).    Social History   Tobacco Use  Smoking Status Former   Current packs/day: 0.00   Average packs/day: 0.3 packs/day for 10.0 years (3.3 ttl pk-yrs)   Types: Cigarettes   Start date: 10/03/2001   Quit date: 10/04/2011   Years since quitting: 12.1  Smokeless Tobacco Never  Tobacco Comments   No longer smoking cigarettes    Goals Met:  Proper associated with RPD/PD & O2 Sat Independence with exercise equipment Exercise tolerated well No report of concerns or symptoms today Strength training completed today  Goals Unmet:  Not Applicable  Comments: Service time is from 1023 to 1140.    Dr. Slater Staff is Medical Director for Pulmonary Rehab at [facility].

## 2023-11-15 ENCOUNTER — Other Ambulatory Visit: Payer: Self-pay

## 2023-11-15 NOTE — Patient Instructions (Signed)
 Visit Information  Thank you for taking time to visit with me today. Please don't hesitate to contact me if I can be of assistance to you before our next scheduled appointment.  Your next care management appointment is by telephone on Monday, September 15 at 11:30 AM  Please call the care guide team at [phone] if you need to cancel, schedule, or reschedule an appointment.   Please call [phone]-TALK (toll free, 24 hour hotline) if you are experiencing a Mental Health or Behavioral Health Crisis or need someone to talk to.  Clayborne Ly RN BSN CCM [place]  [facility], [facility] Health Nurse Care Coordinator  Direct Dial: [phone] Website: [email or web address]

## 2023-11-15 NOTE — Patient Outreach (Signed)
 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 I obtained verbal consent from Patient.  Visit completed with patient on the phone.  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   Menorrhagia    secondary to uterine fibroids   OSA (obstructive sleep apnea)    07/25/13 HST AHI 33/hr, severe hypoxemia O2 min 42% and 95% of the time <89%   PE (pulmonary embolism) 2014   bilateral   Prediabetes    Pulmonary artery hypertension (HCC)    Pulmonary nodule    (5mm on loeft lower lobe) found on CT scan July 2014, repeat scan Jan 2015 showed less than 4mm   Right ovarian cyst    noted 09/2012    S/P TAH (total abdominal hysterectomy) 06/07/2013   Sleep apnea    SOB (shortness of breath) on exertion    Stomach ulcer    Trichomoniasis    05/2011    Vitamin D  deficiency     Assessment: Patient Reported Symptoms:  Cognitive Cognitive Status: Alert and oriented to person, place, and time, Normal speech and language skills Cognitive/Intellectual Conditions Management [RPT]: None reported or documented in medical history or problem list   Health Maintenance Behaviors: Annual physical exam, Healthy diet Health Facilitated by: Healthy diet, Rest   Neurological Neurological Review of Symptoms: Not assessed    HEENT HEENT Symptoms Reported: Not assessed      Cardiovascular Cardiovascular Symptoms Reported: No symptoms reported Does patient have uncontrolled Hypertension?: Yes Is patient checking Blood Pressure at home?: Yes Patient's Recent BP reading at home: 150-160's/80's Cardiovascular Management Strategies: Medication therapy, Medical device, Routine screening, Diet modification Cardiovascular Self-Management Outcome: 4 (good)  Respiratory Respiratory Symptoms Reported: Shortness of breath Respiratory Management Strategies: CPAP, Oxygen  therapy, Pulmonary rehab, Routine screening, Medication therapy Respiratory Self-Management Outcome: 4 (good)  Endocrine Endocrine Symptoms Reported: Not assessed    Gastrointestinal Gastrointestinal Symptoms Reported: Not assessed      Genitourinary Genitourinary Symptoms Reported: Not assessed    Integumentary Integumentary Symptoms Reported: Not assessed    Musculoskeletal Musculoskelatal Symptoms Reviewed: Not assessed        Psychosocial Psychosocial Symptoms Reported: Not assessed   Major Change/Loss/Stressor/Fears (CP): Medical condition, self, Medical condition, family Quality of Family Relationships: involved, helpful, supportive      11/03/2023   10:31 AM  Depression screen PHQ 2/9  Decreased Interest 0  Down, Depressed, Hopeless 0  PHQ - 2 Score 0  Altered sleeping 2  Tired, decreased energy 1  Change in appetite 1  Feeling bad or failure about yourself  0  Trouble concentrating 0  Moving slowly or fidgety/restless 0  Suicidal thoughts 0  PHQ-9 Score 4  Difficult doing work/chores Somewhat difficult  There were no vitals filed for this visit.  Medications Reviewed Today     Reviewed by Morgan Clayborne CROME, RN (Registered Nurse) on 11/15/23 at 1131  Med List Status: <None>   Medication Order Taking? Sig Documenting Provider Last Dose Status Informant   acetaminophen  (TYLENOL ) 500 MG tablet [id number]  Take 1,000 mg by mouth every 6 (six) hours as needed for moderate pain or headache.  [provider]  Active Self  albuterol  (ACCUNEB ) 1.25 MG/3ML nebulizer solution [id number]  Take 3 mLs (1.25 mg total) by nebulization every 6 (six) hours as needed for wheezing. Georgina Speaks, FNP  Active   albuterol  (PROAIR  HFA) 108 [id number] Base) MCG/ACT inhaler [id number]  Inhale 1-2 puffs into the lungs every 6 (six) hours as needed for wheezing or shortness of breath. McLean-Scocuzza, Randine SAILOR, MD  Active   Azelastine  HCl 137 MCG/SPRAY SOLN [id number]  PLACE 1 SPRAY INTO BOTH NOSTRILS 2 (TWO) TIMES DAILY. USE IN EACH NOSTRIL AS DIRECTED Webb, Padonda B, FNP  Active   chlorpheniramine-HYDROcodone  (TUSSIONEX) 10-8 MG/5ML [id number]  Take 5 mLs by mouth every 12 (twelve) hours as needed for cough. Georgina Speaks, FNP  Active   clotrimazole -betamethasone  (LOTRISONE ) cream [id number]  Apply 1 application topically 2 (two) times daily. Prn leg dermatitis McLean-Scocuzza, Randine SAILOR, MD  Active   Colchicine  (MITIGARE ) 0.6 MG CAPS [id number]  Take 1 capsule by mouth as needed. As needed gout flare max # of pills 2 in 24 hours McLean-Scocuzza, Randine SAILOR, MD  Active   fluticasone  (FLONASE ) 50 MCG/ACT nasal spray [id number]  Place into both nostrils daily as needed for allergies or rhinitis. [provider]  Active Self  Fluticasone -Salmeterol (ADVAIR  HFA IN) [id number]  Inhale 1 puff into the lungs as needed. [provider]  Active   heparin  lock flush 100 unit/mL [id number]   Odean Potts, MD  Active   ibuprofen  (ADVIL ) 800 MG tablet [id number]  Take 1 tablet (800 mg total) by mouth every 8 (eight) hours as needed. McLean-Scocuzza, Randine SAILOR, MD  Active   linaclotide  (LINZESS ) 72 MCG capsule [id number]  Take 1 capsule (72 mcg total) by mouth daily before breakfast. Georgina Speaks, FNP  Active   loratadine  (CLARITIN ) 10 MG tablet [id number]  Take 1 tablet (10 mg total) by  mouth daily. McLean-Scocuzza, Randine SAILOR, MD  Active   Magnesium  Cl-Calcium  Carbonate (SLOW MAGNESIUM /CALCIUM ) 70-117 MG TBEC [id number]  Take 2 tablets by mouth in the morning and at bedtime. Gudena, Vinay, MD  Active Self  Magnesium  Oxide -Mg Supplement 200 MG CHEW [id number]  Chew 1 gummy by mouth daily. Lee, [place], NP  Active   metoprolol  tartrate (LOPRESSOR ) 25 MG tablet [id number]  TAKE 1 TABLET BY MOUTH TWICE A DAY McLean-Scocuzza, Randine SAILOR, MD  Active   Multiple Vitamin (MULTIVITAMIN) tablet [id number]  Take 1 tablet by mouth daily. [provider]  Active Self  omeprazole  (PRILOSEC) 40 MG capsule [id number]  Take 40 mg by mouth as needed. [provider]  Active   OPSUMIT  10 MG TABS [id number]  Take 10 mg by mouth daily. [provider]  Active Self  ORENITRAM  0.25 MG TBCR [id number]  Take 0.5 mg by mouth.  Take 2 tablets (0.5 mg total) by mouth every eight (8) hours. Take along with 5 mg tablet and four 1 mg tablets, total 8.5 mg every 8 hours [provider]  Active   ORENITRAM  1 MG TBCR [id number]  Take by mouth. [provider]  Active   ORENITRAM  5  MG TBCR [id number]  Take by mouth. [provider]  Active   Polyethyl Glycol-Propyl Glycol (SYSTANE OP) [id number]  Place 1 drop into both eyes 3 (three) times daily as needed (dry/irritated eyes.). [provider]  Active Self  Potassium Chloride  ER 20 MEQ TBCR [id number]  Take 1 tablet by mouth as needed. [provider]  Active     Discontinued 08/17/16 0953   rivaroxaban  (XARELTO ) 20 MG TABS tablet [id number]  Take 20 mg by mouth daily with supper. [provider]  Active Self  Semaglutide , 2 MG/DOSE, (OZEMPIC , 2 MG/DOSE,) 8 MG/3ML SOPN [id number]  Inject 2 mg into the skin once a week. Verdon Parry D, MD  Active   sildenafil  (REVATIO ) 20 MG tablet [id number]  Take 1-2 tablets (20-40 mg total) by mouth 3 (three) times daily. As of 07/03/20 taking 20 mg tid per [facility] pulm Dr.  LEvarge McLean-Scocuzza, Randine SAILOR, MD  Active   sodium chloride  flush (NS) 0.9 % injection 10 mL [id number]   Odean Potts, MD  Active   spironolactone  (ALDACTONE ) 50 MG tablet [id number]  Take 1 tablet (50 mg total) by mouth daily. McLean-Scocuzza, Randine SAILOR, MD  Active   telmisartan  (MICARDIS ) 40 MG tablet [id number]  Take 1 tablet (40 mg total) by mouth daily. Lee, [place], NP  Active   torsemide  (DEMADEX ) 20 MG tablet [id number]  Take 20 mg by mouth daily as needed. [provider]  Active   triamcinolone  cream (KENALOG ) 0.1 % [id number]  Apply 1 application topically daily as needed (leg discoloration). McLean-Scocuzza, Randine SAILOR, MD  Active Self  Vitamin D , Ergocalciferol , (DRISDOL ) 1.25 MG (50000 UNIT) CAPS capsule [id number]  Take 1 capsule (50,000 Units total) by mouth every 7 (seven) days. *Need office visit for refillsDEWAINE Verdon, Caren D, MD  Active   [facility] 2 x 45 MG subcutaneous injection [id number]  Inject into the skin. [provider]  Active             Recommendation:   PCP Follow-up Specialty provider follow-up with Pulmonary Rehab as directed  Continue Current Plan of Care  Follow Up Plan:   Telephone follow up with Jasmine Lewis LCSW on Tuesday, 11/16/23 at 10:30 AM Telephone follow up with nurse care manager Clayborne Ly RN on Monday, September 15 at 11:30 AM   Clayborne Ly RN BSN CCM [place]  [facility], [facility] Health Nurse Care Coordinator  Direct Dial: [phone] Website: [email or web address]

## 2023-11-16 ENCOUNTER — Encounter (HOSPITAL_COMMUNITY)
Admission: RE | Admit: 2023-11-16 | Discharge: 2023-11-16 | Disposition: A | Discharge status: Home / Self Care | Source: Ambulatory Visit | Attending: Pulmonary Disease | Admitting: Pulmonary Disease

## 2023-11-16 ENCOUNTER — Telehealth: Payer: Self-pay | Admitting: Licensed Clinical Social Worker

## 2023-11-16 VITALS — Wt 248.7 lb

## 2023-11-16 DIAGNOSIS — I2721 Secondary pulmonary arterial hypertension: Secondary | ICD-10-CM | POA: Diagnosis not present

## 2023-11-16 NOTE — Progress Notes (Signed)
 Daily Session Note  Patient Details  Name: [name] MRN: [id number] Date of Birth: 1965-12-04 Referring Provider:   Conrad Ports Pulmonary Rehab Walk Test from 11/03/2023 in [facility] for Heart, Vascular, & Lung Health  Referring Provider Levarge    Encounter Date: 11/16/2023  Check In:  Session Check In - 11/16/23 1214       Check-In   Supervising physician immediately available to respond to emergencies [facility] MD immediately available    Physician(s) Lum Louis, NP    Location [facility]    Staff Present Ronal Levin, RN, BSN;Robbie Rideaux Claudene, Neita Moats, MS, ACSM-CEP, Exercise Physiologist;Carlette Bernett, RN, BSN    Virtual Visit No    Medication changes reported     No    Fall or balance concerns reported    No    Tobacco Cessation No Change    Warm-up and Cool-down Performed as group-led instruction    Resistance Training Performed Yes    VAD Patient? No    PAD/SET Patient? No      Pain Assessment   Currently in Pain? No/denies    Multiple Pain Sites No          Capillary Blood Glucose: No results found for this or any previous visit (from the past 24 hours).   Exercise Prescription Changes - 11/16/23 1200       Response to Exercise   Blood Pressure (Admit) 148/80    Blood Pressure (Exercise) 158/96    Blood Pressure (Exit) 136/80    Heart Rate (Admit) 79 bpm    Heart Rate (Exercise) 101 bpm    Heart Rate (Exit) 70 bpm    Oxygen  Saturation (Admit) 95 %    Oxygen  Saturation (Exercise) 92 %    Oxygen  Saturation (Exit) 95 %    Rating of Perceived Exertion (Exercise) 15    Perceived Dyspnea (Exercise) 1    Duration Continue with 30 min of aerobic exercise without signs/symptoms of physical distress.    Intensity THRR unchanged      Progression   Progression Continue to progress workloads to maintain intensity without signs/symptoms of physical distress.      Resistance Training   Training  Prescription Yes    Weight Red bands    Reps 10-15    Time 10 Minutes      Oxygen    Oxygen  Continuous    Liters 2-4      NuStep   Level 2    SPM 98    Minutes 15    METs 1.8      Track   Laps 7    Minutes 15    METs 2.08      Oxygen    Maintain Oxygen  Saturation 88% or higher          Social History   Tobacco Use  Smoking Status Former   Current packs/day: 0.00   Average packs/day: 0.3 packs/day for 10.0 years (3.3 ttl pk-yrs)   Types: Cigarettes   Start date: 10/03/2001   Quit date: 10/04/2011   Years since quitting: 12.1  Smokeless Tobacco Never  Tobacco Comments   No longer smoking cigarettes    Goals Met:  Proper associated with RPD/PD & O2 Sat Independence with exercise equipment Exercise tolerated well No report of concerns or symptoms today Strength training completed today  Goals Unmet:  Not Applicable  Comments: Service time is from 1012 to 1140.    Dr. Slater Staff is Medical Director for  Pulmonary Rehab at [facility].

## 2023-11-17 ENCOUNTER — Other Ambulatory Visit: Payer: Self-pay

## 2023-11-17 ENCOUNTER — Other Ambulatory Visit (HOSPITAL_COMMUNITY): Payer: Self-pay

## 2023-11-18 ENCOUNTER — Encounter (HOSPITAL_COMMUNITY)
Admission: RE | Admit: 2023-11-18 | Discharge: 2023-11-18 | Disposition: A | Discharge status: Home / Self Care | Source: Ambulatory Visit | Attending: Pulmonary Disease | Admitting: Pulmonary Disease

## 2023-11-18 DIAGNOSIS — I2721 Secondary pulmonary arterial hypertension: Secondary | ICD-10-CM | POA: Diagnosis not present

## 2023-11-18 NOTE — Progress Notes (Signed)
 Daily Session Note  Patient Details  Name: POLLYANNA LEVAY MRN: [id number] Date of Birth: [birth year] Referring Provider:   Conrad Ports Pulmonary Rehab Walk Test from 11/03/2023 in [facility]  Referring Provider [place]    Encounter Date: 11/18/2023  Check In:  Session Check In - 11/18/23 1037       Check-In   Supervising physician immediately available to respond to emergencies [facility] MD immediately available    Physician(s) Rosaline Bane, NP    Location [facility]    Staff Present Ronal Levin, RN, BSN;Casey Claudene, Neita Moats, MS, ACSM-CEP, Exercise Physiologist;Randi Midge BS, ACSM-CEP, Exercise Physiologist    Virtual Visit No    Medication changes reported     No    Fall or balance concerns reported    No    Tobacco Cessation No Change    Warm-up and Cool-down Performed as group-led instruction    Resistance Training Performed Yes    VAD Patient? No    PAD/SET Patient? No      Pain Assessment   Currently in Pain? No/denies          Capillary Blood Glucose: No results found for this or any previous visit (from the past 24 hours).    Social History   Tobacco Use  Smoking Status Former   Current packs/day: 0.00   Average packs/day: 0.3 packs/day for 10.0 years (3.3 ttl pk-yrs)   Types: Cigarettes   Start date: 10/03/2001   Quit date: 10/04/2011   Years since quitting: 12.1  Smokeless Tobacco Never  Tobacco Comments   No longer smoking cigarettes    Goals Met:  Exercise tolerated well No report of concerns or symptoms today Strength training completed today  Goals Unmet:  Not Applicable  Comments: Service time is from 1018 to 1140    Dr. Slater Staff is Medical Director for Pulmonary Rehab at [facility].

## 2023-11-19 ENCOUNTER — Other Ambulatory Visit: Payer: Self-pay

## 2023-11-23 ENCOUNTER — Encounter (HOSPITAL_COMMUNITY)
Admission: RE | Admit: 2023-11-23 | Discharge: 2023-11-23 | Disposition: A | Discharge status: Home / Self Care | Source: Ambulatory Visit | Attending: Pulmonary Disease | Admitting: Pulmonary Disease

## 2023-11-23 DIAGNOSIS — I2721 Secondary pulmonary arterial hypertension: Secondary | ICD-10-CM

## 2023-11-23 NOTE — Progress Notes (Signed)
 Daily Session Note  Patient Details  Name: [name] MRN: [id number] Date of Birth: 01-01-1966 Referring Provider:   Conrad Ports Pulmonary Rehab Walk Test from 11/03/2023 in [facility] for [facility], Vascular, & Lung [facility]  Referring Provider [place]    Encounter Date: 11/23/2023  Check In:  Session Check In - 11/23/23 1045       Check-In   Supervising physician immediately available to respond to emergencies [facility] MD immediately available    Physician(s) Rosaline Bane, NP    [facility]    Staff Present Ronal Levin, RN, BSN;Casey Claudene, Neita Moats, MS, ACSM-CEP, Exercise Physiologist;Randi Midge BS, ACSM-CEP, Exercise Physiologist    Virtual Visit No    Medication changes reported     No    Fall or balance concerns reported    No    Tobacco Cessation No Change    Warm-up and Cool-down Performed as group-led instruction    Resistance Training Performed Yes    VAD Patient? No    PAD/SET Patient? No      Pain Assessment   Currently in Pain? No/denies    Pain Score 0-No pain    Multiple Pain Sites No          Capillary Blood Glucose: No results found for this or any previous visit (from the past 24 hours).    Social History   Tobacco Use  Smoking Status Former   Current packs/day: 0.00   Average packs/day: 0.3 packs/day for 10.0 years (3.3 ttl pk-yrs)   Types: Cigarettes   Start date: 10/03/2001   Quit date: 10/04/2011   Years since quitting: 12.1  Smokeless Tobacco Never  Tobacco Comments   No longer smoking cigarettes    Goals Met:  Exercise tolerated well Queuing for purse lip breathing No report of concerns or symptoms today Strength training completed today  Goals Unmet:  Not Applicable  Comments: Service time is from 1017 to 1131    Dr. Slater Staff is Medical Director for Pulmonary Rehab at [facility].

## 2023-11-25 ENCOUNTER — Encounter (HOSPITAL_COMMUNITY)
Admission: RE | Admit: 2023-11-25 | Discharge: 2023-11-25 | Disposition: A | Discharge status: Home / Self Care | Source: Ambulatory Visit | Attending: Pulmonary Disease

## 2023-11-25 DIAGNOSIS — I2721 Secondary pulmonary arterial hypertension: Secondary | ICD-10-CM | POA: Diagnosis not present

## 2023-11-25 NOTE — Progress Notes (Signed)
 Daily Session Note  Patient Details  Name: [name] MRN: [id number] Date of Birth: June 05, 1965 Referring Provider:   Conrad Ports Pulmonary Rehab Walk Test from 11/03/2023 in [facility] for [facility], Vascular, & [facility]  Referring Provider [place]    Encounter Date: 11/25/2023  Check In:  Session Check In - 11/25/23 1029       Check-In   Supervising physician immediately available to respond to emergencies [facility] MD immediately available    Physician(s) Jackee Wyn Raddle, NP    [facility]    Staff Present Ronal Levin, RN, BSN;Casey Claudene, Neita Moats, MS, ACSM-CEP, Exercise Physiologist;Randi [facility], ACSM-CEP, Exercise Physiologist    Virtual Visit No    Medication changes reported     No    Fall or balance concerns reported    No    Tobacco Cessation No Change    Warm-up and Cool-down Performed as group-led instruction    Resistance Training Performed Yes    VAD Patient? No    PAD/SET Patient? No      Pain Assessment   Currently in Pain? No/denies    Pain Score 0-No pain    Multiple Pain Sites No          Capillary Blood Glucose: No results found for this or any previous visit (from the past 24 hours).    Social History   Tobacco Use  Smoking Status Former   Current packs/day: 0.00   Average packs/day: 0.3 packs/day for 10.0 years (3.3 ttl pk-yrs)   Types: Cigarettes   Start date: 10/03/2001   Quit date: 10/04/2011   Years since quitting: 12.1  Smokeless Tobacco Never  Tobacco Comments   No longer smoking cigarettes    Goals Met:  Proper associated with RPD/PD & O2 Sat Exercise tolerated well No report of concerns or symptoms today Strength training completed today  Goals Unmet:  Not Applicable  Comments: Service time is from 1015 to 1138.    Dr. Slater Staff is Medical Director for Pulmonary Rehab at [facility].

## 2023-11-30 ENCOUNTER — Encounter (HOSPITAL_COMMUNITY)
Admission: RE | Admit: 2023-11-30 | Discharge: 2023-11-30 | Disposition: A | Discharge status: Home / Self Care | Source: Ambulatory Visit | Attending: Pulmonary Disease | Admitting: Pulmonary Disease

## 2023-11-30 VITALS — Wt 253.1 lb

## 2023-11-30 DIAGNOSIS — I5032 Chronic diastolic (congestive) heart failure: Secondary | ICD-10-CM | POA: Insufficient documentation

## 2023-11-30 DIAGNOSIS — G4733 Obstructive sleep apnea (adult) (pediatric): Secondary | ICD-10-CM | POA: Diagnosis present

## 2023-11-30 DIAGNOSIS — I2721 Secondary pulmonary arterial hypertension: Secondary | ICD-10-CM | POA: Insufficient documentation

## 2023-11-30 DIAGNOSIS — Z86711 Personal history of pulmonary embolism: Secondary | ICD-10-CM | POA: Insufficient documentation

## 2023-11-30 DIAGNOSIS — I493 Ventricular premature depolarization: Secondary | ICD-10-CM | POA: Diagnosis present

## 2023-11-30 DIAGNOSIS — I50810 Right heart failure, unspecified: Secondary | ICD-10-CM | POA: Diagnosis present

## 2023-11-30 DIAGNOSIS — I10 Essential (primary) hypertension: Secondary | ICD-10-CM | POA: Insufficient documentation

## 2023-11-30 NOTE — Progress Notes (Signed)
 Daily Session Note  Patient Details  Name: [name] MRN: [id number] Date of Birth: 1965-07-07 Referring Provider:   Conrad Ports Pulmonary Rehab Walk Test from 11/03/2023 in [facility] for [facility], Vascular, [facility]  Referring Provider [place]    Encounter Date: 11/30/2023  Check In:  Session Check In - 11/30/23 1123       Check-In   Supervising physician immediately available to respond to emergencies [facility] MD immediately available    Physician(s) Josefa Beauvais, NP    [facility]    Staff Present Ronal Levin, RN, BSN;Casey Claudene, Neita Moats, MS, ACSM-CEP, Exercise Physiologist;Randi Midge BS, ACSM-CEP, Exercise Physiologist    Virtual Visit No    Medication changes reported     No    Fall or balance concerns reported    No    Tobacco Cessation No Change    Warm-up and Cool-down Performed as group-led instruction    Resistance Training Performed Yes    VAD Patient? No    PAD/SET Patient? No      Pain Assessment   Currently in Pain? No/denies          Capillary Blood Glucose: No results found for this or any previous visit (from the past 24 hours).   Exercise Prescription Changes - 11/30/23 1200       Response to Exercise   Blood Pressure (Admit) 150/70    Blood Pressure (Exercise) 140/84    Blood Pressure (Exit) 132/80    Heart Rate (Admit) 99 bpm    Heart Rate (Exercise) 100 bpm    Heart Rate (Exit) 82 bpm    Oxygen  Saturation (Admit) 89 %    Oxygen  Saturation (Exercise) 88 %    Oxygen  Saturation (Exit) 94 %    Rating of Perceived Exertion (Exercise) 13    Perceived Dyspnea (Exercise) 1    Duration Continue with 30 min of aerobic exercise without signs/symptoms of physical distress.    Intensity THRR unchanged      Progression   Progression Continue to progress workloads to maintain intensity without signs/symptoms of physical distress.      Resistance Training   Training Prescription Yes     Weight Red bands    Reps 10-15    Time 10 Minutes      Oxygen    Oxygen  Continuous    Liters 2-4      NuStep   Level 3    SPM 114    Minutes 15    METs 1.7      Track   Laps 9    Minutes 15    METs 2.38      Oxygen    Maintain Oxygen  Saturation 88% or higher          Social History   Tobacco Use  Smoking Status Former   Current packs/day: 0.00   Average packs/day: 0.3 packs/day for 10.0 years (3.3 ttl pk-yrs)   Types: Cigarettes   Start date: 10/03/2001   Quit date: 10/04/2011   Years since quitting: 12.1  Smokeless Tobacco Never  Tobacco Comments   No longer smoking cigarettes    Goals Met:  Proper associated with RPD/PD & O2 Sat Exercise tolerated well No report of concerns or symptoms today Strength training completed today  Goals Unmet:  Not Applicable  Comments: Service time is from 1025 to 1128.    Dr. Slater Staff is Medical Director for Pulmonary Rehab at [facility]

## 2023-12-01 NOTE — Progress Notes (Signed)
 Pulmonary Individual Treatment Plan  Patient Details  Name: MERCER STALLWORTH MRN: [id number] Date of Birth: [birth year] Referring Provider:   Conrad Ports Pulmonary Rehab Walk Test from 11/03/2023 in [facility]  Referring Provider Levarge    Initial Encounter Date:  Flowsheet Row Pulmonary Rehab Walk Test from 11/03/2023 in [facility] for [facility], [facility]  Date 11/03/23    Visit Diagnosis: PAH (pulmonary artery hypertension) (HCC)  Patient's Home Medications on Admission:   Current Outpatient Medications:    acetaminophen  (TYLENOL ) 500 MG tablet, Take 1,000 mg by mouth every 6 (six) hours as needed for moderate pain or headache. , Disp: , Rfl:    albuterol  (ACCUNEB ) 1.25 MG/3ML nebulizer solution, Take 3 mLs (1.25 mg total) by nebulization every 6 (six) hours as needed for wheezing., Disp: 360 mL, Rfl: 12   albuterol  (PROAIR  HFA) 108 (90 Base) MCG/ACT inhaler, Inhale 1-2 puffs into the lungs every 6 (six) hours as needed for wheezing or shortness of breath., Disp: 18 g, Rfl: 11   Azelastine  HCl 137 MCG/SPRAY SOLN, PLACE 1 SPRAY INTO BOTH NOSTRILS 2 (TWO) TIMES DAILY. USE IN EACH NOSTRIL AS DIRECTED, Disp: 30 mL, Rfl: 4   chlorpheniramine-HYDROcodone  (TUSSIONEX) 10-8 MG/5ML, Take 5 mLs by mouth every 12 (twelve) hours as needed for cough., Disp: 115 mL, Rfl: 0   clotrimazole -betamethasone  (LOTRISONE ) cream, Apply 1 application topically 2 (two) times daily. Prn leg dermatitis, Disp: 45 g, Rfl: 0   Colchicine  (MITIGARE ) 0.6 MG CAPS, Take 1 capsule by mouth as needed. As needed gout flare max # of pills 2 in 24 hours, Disp: 30 capsule, Rfl: 11   fluticasone  (FLONASE ) 50 MCG/ACT nasal spray, Place into both nostrils daily as needed for allergies or rhinitis., Disp: , Rfl:    Fluticasone -Salmeterol (ADVAIR  HFA IN), Inhale 1 puff into the lungs as needed., Disp: , Rfl:    ibuprofen  (ADVIL ) 800 MG  tablet, Take 1 tablet (800 mg total) by mouth every 8 (eight) hours as needed., Disp: 90 tablet, Rfl: 1   linaclotide  (LINZESS ) 72 MCG capsule, Take 1 capsule (72 mcg total) by mouth daily before breakfast., Disp: 90 capsule, Rfl: 1   loratadine  (CLARITIN ) 10 MG tablet, Take 1 tablet (10 mg total) by mouth daily., Disp: 90 tablet, Rfl: 3   Magnesium  Cl-Calcium  Carbonate (SLOW MAGNESIUM /CALCIUM ) 70-117 MG TBEC, Take 2 tablets by mouth in the morning and at bedtime., Disp: , Rfl:    Magnesium  Oxide -Mg Supplement 200 MG CHEW, Chew 1 gummy by mouth daily., Disp: 30 tablet, Rfl: 3   metoprolol  tartrate (LOPRESSOR ) 25 MG tablet, TAKE 1 TABLET BY MOUTH TWICE A DAY, Disp: 180 tablet, Rfl: 3   Multiple Vitamin (MULTIVITAMIN) tablet, Take 1 tablet by mouth daily., Disp: , Rfl:    omeprazole  (PRILOSEC) 40 MG capsule, Take 40 mg by mouth as needed., Disp: , Rfl:    OPSUMIT  10 MG TABS, Take 10 mg by mouth daily., Disp: , Rfl: 8   ORENITRAM  0.25 MG TBCR, Take 0.5 mg by mouth.  Take 2 tablets (0.5 mg total) by mouth every eight (8) hours. Take along with 5 mg tablet and four 1 mg tablets, total 8.5 mg every 8 hours, Disp: , Rfl:    ORENITRAM  1 MG TBCR, Take by mouth., Disp: , Rfl:    ORENITRAM  5 MG TBCR, Take by mouth., Disp: , Rfl:    Polyethyl Glycol-Propyl Glycol (SYSTANE OP), Place 1  drop into both eyes 3 (three) times daily as needed (dry/irritated eyes.)., Disp: , Rfl:    Potassium Chloride  ER 20 MEQ TBCR, Take 1 tablet by mouth as needed., Disp: , Rfl:    rivaroxaban  (XARELTO ) 20 MG TABS tablet, Take 20 mg by mouth daily with supper., Disp: , Rfl:    Semaglutide , 2 MG/DOSE, (OZEMPIC , 2 MG/DOSE,) 8 MG/3ML SOPN, Inject 2 mg into the skin once a week., Disp: 3 mL, Rfl: 1   sildenafil  (REVATIO ) 20 MG tablet, Take 1-2 tablets (20-40 mg total) by mouth 3 (three) times daily. As of 07/03/20 taking 20 mg tid per [facility] Dr. Janie, Disp: 10 tablet, Rfl: 0   spironolactone  (ALDACTONE ) 50 MG tablet, Take 1 tablet  (50 mg total) by mouth daily., Disp: 90 tablet, Rfl: 3   telmisartan  (MICARDIS ) 40 MG tablet, Take 1 tablet (40 mg total) by mouth daily., Disp: 90 tablet, Rfl: 3   torsemide  (DEMADEX ) 20 MG tablet, Take 20 mg by mouth daily as needed., Disp: , Rfl:    triamcinolone  cream (KENALOG ) 0.1 %, Apply 1 application topically daily as needed (leg discoloration)., Disp: 454 g, Rfl: 2   Vitamin D , Ergocalciferol , (DRISDOL ) 1.25 MG (50000 UNIT) CAPS capsule, Take 1 capsule (50,000 Units total) by mouth every 7 (seven) days. *Need office visit for refills*, Disp: 5 capsule, Rfl: 0   WINREVAIR 2 x 45 MG subcutaneous injection, Inject into the skin., Disp: , Rfl:  No current facility-administered medications for this encounter.  Facility-Administered Medications Ordered in Other Encounters:    heparin  lock flush 100 unit/mL, 500 Units, Intracatheter, Once PRN, Gudena, Vinay, MD   sodium chloride  flush (NS) 0.9 % injection 10 mL, 10 mL, Intracatheter, PRN, Gudena, Vinay, MD  Past Medical History: 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   Menorrhagia    secondary to uterine fibroids   OSA (obstructive sleep apnea)    07/25/13 HST AHI 33/hr, severe hypoxemia O2 min 42% and 95% of the time <89%   PE (pulmonary embolism) 2014   bilateral   Prediabetes    Pulmonary artery hypertension (HCC)    Pulmonary nodule    (5mm on loeft lower lobe) found on CT scan July 2014, repeat scan Jan 2015 showed less than 4mm   Right ovarian cyst    noted 09/2012    S/P TAH (total abdominal hysterectomy) 06/07/2013    Sleep apnea    SOB (shortness of breath) on exertion    Stomach ulcer    Trichomoniasis    05/2011    Vitamin D  deficiency     Tobacco Use: Social History   Tobacco Use  Smoking Status Former   Current packs/day: 0.00   Average packs/day: 0.3 packs/day for 10.0 years (3.3 ttl pk-yrs)   Types: Cigarettes   Start date: 10/03/2001   Quit date: 10/04/2011   Years since quitting: 12.1  Smokeless Tobacco Never  Tobacco Comments   No longer smoking cigarettes  Labs: Review Flowsheet  More data exists      Latest Ref Rng & Units 04/30/2022 09/09/2022 11/12/2022 04/13/2023 10/19/2023  Labs for ITP Cardiac and Pulmonary Rehab  Cholestrol 100 - 199 mg/dL 819  808  - 778  751   LDL (calc) 0 - 99 mg/dL 889  879  - 857  835   HDL-C >39 mg/dL 43  49  - 56  43   Trlycerides 0 - 149 mg/dL 848  878  - 868  779   Hemoglobin A1c 4.8 - 5.6 % 5.3  5.1  5.2  5.1  5.3     Capillary Blood Glucose: Lab Results  Component Value Date   GLUCAP 119 (H) 09/09/2017   GLUCAP 113 (H) 09/09/2017   GLUCAP 95 09/02/2017   GLUCAP 108 (H) 07/27/2016   GLUCAP 116 (H) 07/27/2016     Pulmonary Assessment Scores:  Pulmonary Assessment Scores     Row Name 11/03/23 1149         ADL UCSD   ADL Phase Entry     SOB Score total 56       CAT Score   CAT Score 21       mMRC Score   mMRC Score 4       UCSD: Self-administered rating of dyspnea associated with activities of daily living (ADLs) 6-point scale (0 = not at all to 5 = maximal or unable to do because of breathlessness)  Scoring Scores range from 0 to 120.  Minimally important difference is 5 units  CAT: CAT can identify the health impairment of COPD patients and is better correlated with disease progression.  CAT has a scoring range of zero to 40. The CAT score is classified into four groups of low (less than 10), medium (10 - 20), high (21-30) and very high (31-40) based on the impact level of disease on health status. A CAT score over  10 suggests significant symptoms.  A worsening CAT score could be explained by an exacerbation, poor medication adherence, poor inhaler technique, or progression of COPD or comorbid conditions.  CAT MCID is 2 points  mMRC: mMRC (Modified Medical Research Council) Dyspnea Scale is used to assess the degree of baseline functional disability in patients of respiratory disease due to dyspnea. No minimal important difference is established. A decrease in score of 1 point or greater is considered a positive change.   Pulmonary Function Assessment:  Pulmonary Function Assessment - 11/03/23 1122       Breath   Bilateral Breath Sounds Clear;Decreased    Shortness of Breath Yes;Limiting activity          Exercise Target Goals: Exercise Program Goal: Individual exercise prescription set using results from initial 6 min walk test and THRR while considering  patient's activity barriers and safety.   Exercise Prescription Goal: Initial exercise prescription builds to 30-45 minutes a day of aerobic activity, 2-3 days per week.  Home exercise guidelines will be given to patient during program as part of exercise prescription that the participant will acknowledge.  Activity Barriers & Risk Stratification:  Activity Barriers & Cardiac Risk Stratification - 11/03/23 1108       Activity Barriers & Cardiac Risk Stratification   Activity Barriers Shortness of Breath;Muscular Weakness;Deconditioning    Cardiac Risk Stratification Low          6 Minute Walk:  6 Minute Walk     Row Name 11/11/23 1205  6 Minute Walk   Phase Initial     Distance 1020 feet     Walk Time 6 minutes     # of Rest Breaks 0     MPH 1.93     METS 2.6     RPE 13     Perceived Dyspnea  1     VO2 Peak 9.1     Symptoms No     Resting HR 79 bpm     Resting BP 150/90     Resting Oxygen  Saturation  94 %     Exercise Oxygen  Saturation  during 6 min walk 87 %     Max Ex. HR 117 bpm     Max Ex. BP 142/80      2 Minute Post BP 140/80       Interval HR   1 Minute HR 104     2 Minute HR 103     3 Minute HR 103     4 Minute HR 117     5 Minute HR 114     6 Minute HR 114     2 Minute Post HR 83     Interval Heart Rate? Yes       Interval Oxygen    Interval Oxygen ? Yes     Baseline Oxygen  Saturation % 94 %     1 Minute Oxygen  Saturation % 91 %     1 Minute Liters of Oxygen  0 L     2 Minute Oxygen  Saturation % 90 %  87% at 1:26     2 Minute Liters of Oxygen  0 L  increased to 2L     3 Minute Oxygen  Saturation % 89 %     3 Minute Liters of Oxygen  2 L  increased to 4L     4 Minute Oxygen  Saturation % 90 %     4 Minute Liters of Oxygen  4 L     5 Minute Oxygen  Saturation % 91 %     5 Minute Liters of Oxygen  4 L     6 Minute Oxygen  Saturation % 90 %     6 Minute Liters of Oxygen  4 L     2 Minute Post Oxygen  Saturation % 94 %     2 Minute Post Liters of Oxygen  4 L        Oxygen  Initial Assessment:  Oxygen  Initial Assessment - 11/11/23 1208       Initial 6 min Walk   Oxygen  Used Continuous    Liters per minute 4          Oxygen  Re-Evaluation:  Oxygen  Re-Evaluation     Row Name 11/11/23 1208 11/23/23 1637           Program Oxygen  Prescription   Program Oxygen  Prescription Continuous Continuous      Liters per minute 4 4        Home Oxygen    Home Oxygen  Device Home Concentrator  d tanks with pulse regulator Home Concentrator  d tanks with pulse regulator      Sleep Oxygen  Prescription BiPAP BiPAP      Liters per minute 6 6      Home Exercise Oxygen  Prescription Pulsed  d tank with pulse regulator Pulsed  d tank with pulse regulator      Liters per minute 4 4      Home Resting Oxygen  Prescription Continuous Continuous      Liters per minute 2 2  Compliance with Home Oxygen  Use Yes Yes        Goals/Expected Outcomes   Short Term Goals To learn and exhibit compliance with exercise, home and travel O2 prescription;To learn and understand importance of monitoring SPO2 with  pulse oximeter and demonstrate accurate use of the pulse oximeter.;To learn and understand importance of maintaining oxygen  saturations>88%;To learn and demonstrate proper pursed lip breathing techniques or other breathing techniques. ;To learn and demonstrate proper use of respiratory medications To learn and exhibit compliance with exercise, home and travel O2 prescription;To learn and understand importance of monitoring SPO2 with pulse oximeter and demonstrate accurate use of the pulse oximeter.;To learn and understand importance of maintaining oxygen  saturations>88%;To learn and demonstrate proper pursed lip breathing techniques or other breathing techniques. ;To learn and demonstrate proper use of respiratory medications      Long  Term Goals Exhibits compliance with exercise, home  and travel O2 prescription;Maintenance of O2 saturations>88%;Compliance with respiratory medication;Verbalizes importance of monitoring SPO2 with pulse oximeter and return demonstration;Exhibits proper breathing techniques, such as pursed lip breathing or other method taught during program session;Demonstrates proper use of MDI's Exhibits compliance with exercise, home  and travel O2 prescription;Maintenance of O2 saturations>88%;Compliance with respiratory medication;Verbalizes importance of monitoring SPO2 with pulse oximeter and return demonstration;Exhibits proper breathing techniques, such as pursed lip breathing or other method taught during program session;Demonstrates proper use of MDI's      Goals/Expected Outcomes Compliance and understanding of oxygen  saturation monitoring and breathing techniques to decrease shortness of breath. Compliance and understanding of oxygen  saturation monitoring and breathing techniques to decrease shortness of breath.         Oxygen  Discharge (Final Oxygen  Re-Evaluation):  Oxygen  Re-Evaluation - 11/23/23 1637       Program Oxygen  Prescription   Program Oxygen  Prescription Continuous     Liters per minute 4      Home Oxygen    Home Oxygen  Device Home Concentrator   d tanks with pulse regulator   Sleep Oxygen  Prescription BiPAP    Liters per minute 6    Home Exercise Oxygen  Prescription Pulsed   d tank with pulse regulator   Liters per minute 4    Home Resting Oxygen  Prescription Continuous    Liters per minute 2    Compliance with Home Oxygen  Use Yes      Goals/Expected Outcomes   Short Term Goals To learn and exhibit compliance with exercise, home and travel O2 prescription;To learn and understand importance of monitoring SPO2 with pulse oximeter and demonstrate accurate use of the pulse oximeter.;To learn and understand importance of maintaining oxygen  saturations>88%;To learn and demonstrate proper pursed lip breathing techniques or other breathing techniques. ;To learn and demonstrate proper use of respiratory medications    Long  Term Goals Exhibits compliance with exercise, home  and travel O2 prescription;Maintenance of O2 saturations>88%;Compliance with respiratory medication;Verbalizes importance of monitoring SPO2 with pulse oximeter and return demonstration;Exhibits proper breathing techniques, such as pursed lip breathing or other method taught during program session;Demonstrates proper use of MDI's    Goals/Expected Outcomes Compliance and understanding of oxygen  saturation monitoring and breathing techniques to decrease shortness of breath.          Initial Exercise Prescription:  Initial Exercise Prescription - 11/03/23 1200       Date of Initial Exercise RX and Referring Provider   Date 11/03/23    Referring Provider Levarge    Expected Discharge Date 02/01/24      Oxygen    Oxygen  Continuous  Liters 4    Maintain Oxygen  Saturation 88% or higher      NuStep   Level 2    SPM 80    Minutes 15    METs 1.5      Track   Minutes 15    METs 2.3      Prescription Details   Frequency (times per week) 2    Duration Progress to 30 minutes of  continuous aerobic without signs/symptoms of physical distress      Intensity   THRR 40-80% of Max Heartrate 65-130    Ratings of Perceived Exertion 11-13    Perceived Dyspnea 0-4      Progression   Progression Continue to progress workloads to maintain intensity without signs/symptoms of physical distress.      Resistance Training   Training Prescription Yes    Weight Red bands    Reps 10-15          Perform Capillary Blood Glucose checks as needed.  Exercise Prescription Changes:   Exercise Prescription Changes     Row Name 11/16/23 1200 11/30/23 1200           Response to Exercise   Blood Pressure (Admit) 148/80 150/70      Blood Pressure (Exercise) 158/96 140/84      Blood Pressure (Exit) 136/80 132/80      Heart Rate (Admit) 79 bpm 99 bpm      Heart Rate (Exercise) 101 bpm 100 bpm      Heart Rate (Exit) 70 bpm 82 bpm      Oxygen  Saturation (Admit) 95 % 89 %      Oxygen  Saturation (Exercise) 92 % 88 %      Oxygen  Saturation (Exit) 95 % 94 %      Rating of Perceived Exertion (Exercise) 15 13      Perceived Dyspnea (Exercise) 1 1      Duration Continue with 30 min of aerobic exercise without signs/symptoms of physical distress. Continue with 30 min of aerobic exercise without signs/symptoms of physical distress.      Intensity THRR unchanged THRR unchanged        Progression   Progression Continue to progress workloads to maintain intensity without signs/symptoms of physical distress. Continue to progress workloads to maintain intensity without signs/symptoms of physical distress.        Resistance Training   Training Prescription Yes Yes      Weight Red bands Red bands      Reps 10-15 10-15      Time 10 Minutes 10 Minutes        Oxygen    Oxygen  Continuous Continuous      Liters 2-4 2-4        NuStep   Level 2 3      SPM 98 114      Minutes 15 15      METs 1.8 1.7        Track   Laps 7 9      Minutes 15 15      METs 2.08 2.38        Oxygen     Maintain Oxygen  Saturation 88% or higher 88% or higher         Exercise Comments:   Exercise Goals and Review:   Exercise Goals     Row Name 11/03/23 1035             Exercise Goals   Increase Physical Activity Yes  Intervention Provide advice, education, support and counseling about physical activity/exercise needs.;Develop an individualized exercise prescription for aerobic and resistive training based on initial evaluation findings, risk stratification, comorbidities and participant's personal goals.       Expected Outcomes Short Term: Attend rehab on a regular basis to increase amount of physical activity.;Long Term: Add in home exercise to make exercise part of routine and to increase amount of physical activity.;Long Term: Exercising regularly at least 3-5 days a week.       Increase Strength and Stamina Yes       Intervention Provide advice, education, support and counseling about physical activity/exercise needs.;Develop an individualized exercise prescription for aerobic and resistive training based on initial evaluation findings, risk stratification, comorbidities and participant's personal goals.       Expected Outcomes Short Term: Increase workloads from initial exercise prescription for resistance, speed, and METs.;Short Term: Perform resistance training exercises routinely during rehab and add in resistance training at home;Long Term: Improve cardiorespiratory fitness, muscular endurance and strength as measured by increased METs and functional capacity ( )       Able to understand and use rate of perceived exertion (RPE) scale Yes       Intervention Provide education and explanation on how to use RPE scale       Expected Outcomes Short Term: Able to use RPE daily in rehab to express subjective intensity level;Long Term:  Able to use RPE to guide intensity level when exercising independently       Able to understand and use Dyspnea scale Yes       Intervention  Provide education and explanation on how to use Dyspnea scale       Expected Outcomes Short Term: Able to use Dyspnea scale daily in rehab to express subjective sense of shortness of breath during exertion;Long Term: Able to use Dyspnea scale to guide intensity level when exercising independently       Knowledge and understanding of Target Heart Rate Range (THRR) Yes       Intervention Provide education and explanation of THRR including how the numbers were predicted and where they are located for reference       Expected Outcomes Short Term: Able to state/look up THRR;Short Term: Able to use daily as guideline for intensity in rehab;Long Term: Able to use THRR to govern intensity when exercising independently       Understanding of Exercise Prescription Yes       Intervention Provide education, explanation, and written materials on patient's individual exercise prescription       Expected Outcomes Short Term: Able to explain program exercise prescription;Long Term: Able to explain home exercise prescription to exercise independently          Exercise Goals Re-Evaluation :  Exercise Goals Re-Evaluation     Row Name 11/23/23 1634             Exercise Goal Re-Evaluation   Exercise Goals Review Increase Physical Activity;Able to understand and use Dyspnea scale;Understanding of Exercise Prescription;Increase Strength and Stamina;Knowledge and understanding of Target Heart Rate Range (THRR);Able to understand and use rate of perceived exertion (RPE) scale       Comments Kamorah has completed 4 exercise sessions. She exercises for 15 min on the track and Nustep. Pihu averages 2.38 METs on the track and 1.9 METs at level 2 on the Nustep. She performs the warmup and cooldown standing without limitations. Will progress Layann to treadmill walking. It is too soon to notate any other  progressions. Will continue to monitor and progress as able.       Expected Outcomes Through exercise at rehab and home,  the patient will decrease shortness of breath with daily activities and feel confident in carrying out an exercise regimen at home.          Discharge Exercise Prescription (Final Exercise Prescription Changes):  Exercise Prescription Changes - 11/30/23 1200       Response to Exercise   Blood Pressure (Admit) 150/70    Blood Pressure (Exercise) 140/84    Blood Pressure (Exit) 132/80    Heart Rate (Admit) 99 bpm    Heart Rate (Exercise) 100 bpm    Heart Rate (Exit) 82 bpm    Oxygen  Saturation (Admit) 89 %    Oxygen  Saturation (Exercise) 88 %    Oxygen  Saturation (Exit) 94 %    Rating of Perceived Exertion (Exercise) 13    Perceived Dyspnea (Exercise) 1    Duration Continue with 30 min of aerobic exercise without signs/symptoms of physical distress.    Intensity THRR unchanged      Progression   Progression Continue to progress workloads to maintain intensity without signs/symptoms of physical distress.      Resistance Training   Training Prescription Yes    Weight Red bands    Reps 10-15    Time 10 Minutes      Oxygen    Oxygen  Continuous    Liters 2-4      NuStep   Level 3    SPM 114    Minutes 15    METs 1.7      Track   Laps 9    Minutes 15    METs 2.38      Oxygen    Maintain Oxygen  Saturation 88% or higher          Nutrition:  Target Goals: Understanding of nutrition guidelines, daily intake of sodium 1500mg , cholesterol 200mg , calories 30% from fat and 7% or less from saturated fats, daily to have 5 or more servings of fruits and vegetables.  Biometrics:    Nutrition Therapy Plan and Nutrition Goals:  Nutrition Therapy & Goals - 11/11/23 1337       Nutrition Therapy   Diet heart healthy diet      Personal Nutrition Goals   Nutrition Goal Patient to improve diet quality by using the plate method as a guide for meal planning to include lean protein/plant protein, fruits, vegetables, whole grains, nonfat dairy as part of a well-balanced diet.     Personal Goal #2 Patient to limit sodium intake to 2300mg  per day    Comments Patient has medical history of OSA, morbid obesity, HTN, pulmonary HTN, PAH, hyperlipidemia, CHF, DM2. Cholesterol is not well controlled. She continues to follow-up with [facility] team and pharmacy regarding elevated blood pressures. She recently started ozempic  to aid with weight loss. Patient will benefit from participation in pulmonary rehab for nutrition education, exercise, and lifestyle modification.      Intervention Plan   Intervention Prescribe, educate and counsel regarding individualized specific dietary modifications aiming towards targeted core components such as weight, hypertension, lipid management, diabetes, heart failure and other comorbidities.;Nutrition handout(s) given to patient.    Expected Outcomes Short Term Goal: Understand basic principles of dietary content, such as calories, fat, sodium, cholesterol and nutrients.;Long Term Goal: Adherence to prescribed nutrition plan.          Nutrition Assessments:  MEDIFICTS Score Key: >=70 Need to make dietary changes  40-70 Heart Healthy Diet <= 40 Therapeutic Level Cholesterol Diet  Flowsheet Row PULMONARY REHAB OTHER RESPIRATORY from 10/17/2020 in [facility] for Heart, Vascular, & Lung Health  Picture Your Plate Total Score on Discharge 65   Picture Your Plate Scores: <59 Unhealthy dietary pattern with much room for improvement. 41-50 Dietary pattern unlikely to meet recommendations for good health and room for improvement. 51-60 More healthful dietary pattern, with some room for improvement.  >60 Healthy dietary pattern, although there may be some specific behaviors that could be improved.    Nutrition Goals Re-Evaluation:  Nutrition Goals Re-Evaluation     Row Name 11/11/23 1337             Goals   Current Weight 246 lb 14.6 oz (112 kg)       Comment cholesterol 248, triglycerides 220, LDL 164, A1c WNL        Expected Outcome Patient has medical history of OSA, morbid obesity, HTN, pulmonary HTN, PAH, hyperlipidemia, CHF, DM2. Cholesterol is not well controlled. She continues to follow-up with [facility] team and pharmacy regarding elevated blood pressures. She recently started ozempic  to aid with weight loss. Patient will benefit from participation in pulmonary rehab for nutrition education, exercise, and lifestyle modification.          Nutrition Goals Discharge (Final Nutrition Goals Re-Evaluation):  Nutrition Goals Re-Evaluation - 11/11/23 1337       Goals   Current Weight 246 lb 14.6 oz (112 kg)    Comment cholesterol 248, triglycerides 220, LDL 164, A1c WNL    Expected Outcome Patient has medical history of OSA, morbid obesity, HTN, pulmonary HTN, PAH, hyperlipidemia, CHF, DM2. Cholesterol is not well controlled. She continues to follow-up with [facility] team and pharmacy regarding elevated blood pressures. She recently started ozempic  to aid with weight loss. Patient will benefit from participation in pulmonary rehab for nutrition education, exercise, and lifestyle modification.          Psychosocial: Target Goals: Acknowledge presence or absence of significant depression and/or stress, maximize coping skills, provide positive support system. Participant is able to verbalize types and ability to use techniques and skills needed for reducing stress and depression.  Initial Review & Psychosocial Screening:  Initial Psych Review & Screening - 11/03/23 1100       Initial Review   Current issues with History of Depression;Current Anxiety/Panic;Current Stress Concerns    Source of Stress Concerns Chronic Illness;Family;Unable to participate in former interests or hobbies    Comments Pt is dealing with stress from her dad recently having a stroke.      Family Dynamics   Good Support System? Yes      Barriers   Psychosocial barriers to participate in program The patient should benefit from  training in stress management and relaxation.;Psychosocial barriers identified (see note)      Screening Interventions   Interventions Encouraged to exercise;To provide support and resources with identified psychosocial needs    Expected Outcomes Short Term goal: Utilizing psychosocial counselor, staff and physician to assist with identification of specific Stressors or current issues interfering with healing process. Setting desired goal for each stressor or current issue identified.;Long Term Goal: Stressors or current issues are controlled or eliminated.;Short Term goal: Identification and review with participant of any Quality of Life or Depression concerns found by scoring the questionnaire.;Long Term goal: The participant improves quality of Life and PHQ9 Scores as seen by post scores and/or verbalization of changes  Quality of Life Scores:  Scores of 19 and below usually indicate a poorer quality of life in these areas.  A difference of  2-3 points is a clinically meaningful difference.  A difference of 2-3 points in the total score of the Quality of Life Index has been associated with significant improvement in overall quality of life, self-image, physical symptoms, and general health in studies assessing change in quality of life.  PHQ-9: Review Flowsheet  More data exists      11/03/2023 10/18/2023 10/06/2023 09/08/2023 04/13/2023  Depression screen PHQ 2/9  Decreased Interest 0 0 0 0 0  Down, Depressed, Hopeless 0 0 0 0 1  PHQ - 2 Score 0 0 0 0 1  Altered sleeping 2 - 0 3 2  Tired, decreased energy 1 - 0 3 1  Change in appetite 1 - 0 0 0  Feeling bad or failure about yourself  0 - 0 0 0  Trouble concentrating 0 - 0 0 0  Moving slowly or fidgety/restless 0 - 0 0 0  Suicidal thoughts 0 - 0 0 0  PHQ-9 Score 4 - 0 6 4  Difficult doing work/chores Somewhat difficult - Not difficult at all Somewhat difficult Somewhat difficult   Interpretation of Total Score  Total Score  Depression Severity:  1-4 = Minimal depression, 5-9 = Mild depression, 10-14 = Moderate depression, 15-19 = Moderately severe depression, 20-27 = Severe depression   Psychosocial Evaluation and Intervention:  Psychosocial Evaluation - 11/03/23 1141       Psychosocial Evaluation & Interventions   Interventions Stress management education;Relaxation education;Encouraged to exercise with the program and follow exercise prescription    Comments Initial PHQ 2-9 score is 0/4. She states she feels more stressed than she does depressed. Pt states her dad has recently had a stroke and will be moving in with her sister. This has caused her to feel stressed. Her MD has put in a referral for a mental health specialist. She does not want to try psychotropic meds at this time. Staff will provide patient with education and techniques on ways to reduce stress.    Expected Outcomes For Moana to participate in PR with less stress.    Continue Psychosocial Services  Follow up required by staff          Psychosocial Re-Evaluation:  Psychosocial Re-Evaluation     Row Name 11/24/23 [phone]             Psychosocial Re-Evaluation   Current issues with History of Depression;Current Stress Concerns       Comments Ann denies any new stressors or concerns at this time. She is still dealing with her dad's failing health, but feels like exercising is helping with the stress. Her PCP has put in a referral to a mental health specialist.       Expected Outcomes For Riniyah to participate in PR with less       Interventions Encouraged to attend Pulmonary Rehabilitation for the exercise       Continue Psychosocial Services  Follow up required by staff          Psychosocial Discharge (Final Psychosocial Re-Evaluation):  Psychosocial Re-Evaluation - 11/24/23 0847       Psychosocial Re-Evaluation   Current issues with History of Depression;Current Stress Concerns    Comments Korayma denies any new stressors or  concerns at this time. She is still dealing with her dad's failing health, but feels like exercising is helping with the stress. Her  PCP has put in a referral to a mental health specialist.    Expected Outcomes For Shavonta to participate in PR with less    Interventions Encouraged to attend Pulmonary Rehabilitation for the exercise    Continue Psychosocial Services  Follow up required by staff          Education: Education Goals: Education classes will be provided on a weekly basis, covering required topics. Participant will state understanding/return demonstration of topics presented.  Learning Barriers/Preferences:  Learning Barriers/Preferences - 11/03/23 1105       Learning Barriers/Preferences   Learning Barriers Sight    Learning Preferences Written Material;Skilled Demonstration;Individual Instruction          Education Topics: Know Your Numbers Group instruction that is supported by a PowerPoint presentation. Instructor discusses importance of knowing and understanding resting, exercise, and post-exercise oxygen  saturation, heart rate, and blood pressure. Oxygen  saturation, heart rate, blood pressure, rating of perceived exertion, and dyspnea are reviewed along with a normal range for these values.    Exercise for the Pulmonary Patient Group instruction that is supported by a PowerPoint presentation. Instructor discusses benefits of exercise, core components of exercise, frequency, duration, and intensity of an exercise routine, importance of utilizing pulse oximetry during exercise, safety while exercising, and options of places to exercise outside of rehab.  Flowsheet Row PULMONARY REHAB OTHER RESPIRATORY from 10/17/2020 in [facility] for Heart, Vascular, & Lung Health  Date 10/10/20  Educator Handout    MET Level  Group instruction provided by PowerPoint, verbal discussion, and written material to support subject matter. Instructor reviews what  METs are and how to increase METs.  Flowsheet Row PULMONARY REHAB OTHER RESPIRATORY from 11/11/2023 in [facility] for Heart, Vascular, & Lung Health  Date 11/11/23  Educator EP  Instruction Review Code 1- Verbalizes Understanding    Pulmonary Medications Verbally interactive group education provided by instructor with focus on inhaled medications and proper administration.   Anatomy and Physiology of the Respiratory System Group instruction provided by PowerPoint, verbal discussion, and written material to support subject matter. Instructor reviews respiratory cycle and anatomical components of the respiratory system and their functions. Instructor also reviews differences in obstructive and restrictive respiratory diseases with examples of each.  Flowsheet Row PULMONARY REHAB OTHER RESPIRATORY from 11/25/2023 in [facility] for Heart, Vascular, & Lung Health  Date 11/25/23  Educator RT  Instruction Review Code 1- Verbalizes Understanding    Oxygen  Safety Group instruction provided by PowerPoint, verbal discussion, and written material to support subject matter. There is an overview of "What is Oxygen " and "Why do we need it".  Instructor also reviews how to create a safe environment for oxygen  use, the importance of using oxygen  as prescribed, and the risks of noncompliance. There is a brief discussion on traveling with oxygen  and resources the patient may utilize. Flowsheet Row PULMONARY REHAB OTHER RESPIRATORY from 10/17/2020 in [facility] for Heart, Vascular, & Lung Health  Date 09/05/20  Educator Handout    Oxygen  Use Group instruction provided by PowerPoint, verbal discussion, and written material to discuss how supplemental oxygen  is prescribed and different types of oxygen  supply systems. Resources for more information are provided.    Breathing Techniques Group instruction that is supported by demonstration  and informational handouts. Instructor discusses the benefits of pursed lip and diaphragmatic breathing and detailed demonstration on how to perform both.     Risk Factor Reduction Group  instruction that is supported by a PowerPoint presentation. Instructor discusses the definition of a risk factor, different risk factors for pulmonary disease, and how the heart and lungs work together. Flowsheet Row PULMONARY REHAB OTHER RESPIRATORY from 10/17/2020 in [facility] for Heart, Vascular, & Lung Health  Date 09/19/20  Educator handout    Pulmonary Diseases Group instruction provided by PowerPoint, verbal discussion, and written material to support subject matter. Instructor gives an overview of the different type of pulmonary diseases. There is also a discussion on risk factors and symptoms as well as ways to manage the diseases. Flowsheet Row PULMONARY REHAB OTHER RESPIRATORY from 11/18/2023 in [facility] for Heart, Vascular, & Lung Health  Date 11/18/23  Educator RT  Instruction Review Code 1- Verbalizes Understanding    Stress and Energy Conservation Group instruction provided by PowerPoint, verbal discussion, and written material to support subject matter. Instructor gives an overview of stress and the impact it can have on the body. Instructor also reviews ways to reduce stress. There is also a discussion on energy conservation and ways to conserve energy throughout the day.   Warning Signs and Symptoms Group instruction provided by PowerPoint, verbal discussion, and written material to support subject matter. Instructor reviews warning signs and symptoms of stroke, heart attack, cold and flu. Instructor also reviews ways to prevent the spread of infection.   Other Education Group or individual verbal, written, or video instructions that support the educational goals of the pulmonary rehab program. Flowsheet Row PULMONARY REHAB OTHER  RESPIRATORY from 10/17/2020 in [facility] for Heart, Vascular, & Lung Health  Date 10/17/20  [facility] Plate Handout]  Educator Meredith-H/O  Instruction Review Code 1- Verbalizes Understanding     Knowledge Questionnaire Score:  Knowledge Questionnaire Score - 11/03/23 1034       Knowledge Questionnaire Score   Pre Score 16/18          Core Components/Risk Factors/Patient Goals at Admission:  Personal Goals and Risk Factors at Admission - 11/03/23 1105       Core Components/Risk Factors/Patient Goals on Admission    Weight Management Yes;Weight Loss    Intervention Weight Management: Develop a combined nutrition and exercise program designed to reach desired caloric intake, while maintaining appropriate intake of nutrient and fiber, sodium and fats, and appropriate energy expenditure required for the weight goal.;Weight Management: Provide education and appropriate resources to help participant work on and attain dietary goals.;Weight Management/Obesity: Establish reasonable short term and long term weight goals.;Obesity: Provide education and appropriate resources to help participant work on and attain dietary goals.    Admit Weight 247 lb 9.2 oz (112.3 kg)    Expected Outcomes Short Term: Continue to assess and modify interventions until short term weight is achieved;Long Term: Adherence to nutrition and physical activity/exercise program aimed toward attainment of established weight goal;Weight Loss: Understanding of general recommendations for a balanced deficit meal plan, which promotes 1-2 lb weight loss per week and includes a negative energy balance of [phone] kcal/d;Understanding recommendations for meals to include 15-35% energy as protein, 25-35% energy from fat, 35-60% energy from carbohydrates, less than 200mg  of dietary cholesterol, 20-35 gm of total fiber daily;Understanding of distribution of calorie intake throughout the day with the consumption of 4-5  meals/snacks    Improve shortness of breath with ADL's Yes    Intervention Provide education, individualized exercise plan and daily activity instruction to help decrease symptoms of SOB with activities of daily  living.    Expected Outcomes Short Term: Improve cardiorespiratory fitness to achieve a reduction of symptoms when performing ADLs;Long Term: Be able to perform more ADLs without symptoms or delay the onset of symptoms    Hypertension Yes    Intervention Provide education on lifestyle modifcations including regular physical activity/exercise, weight management, moderate sodium restriction and increased consumption of fresh fruit, vegetables, and low fat dairy, alcohol moderation, and smoking cessation.;Monitor prescription use compliance.    Expected Outcomes Short Term: Continued assessment and intervention until BP is < 140/22mm HG in hypertensive participants. < 130/8mm HG in hypertensive participants with diabetes, heart failure or chronic kidney disease.;Long Term: Maintenance of blood pressure at goal levels.    Stress Yes    Intervention Offer individual and/or small group education and counseling on adjustment to heart disease, stress management and health-related lifestyle change. Teach and support self-help strategies.;Refer participants experiencing significant psychosocial distress to appropriate mental health specialists for further evaluation and treatment. When possible, include family members and significant others in education/counseling sessions.    Expected Outcomes Short Term: Participant demonstrates changes in health-related behavior, relaxation and other stress management skills, ability to obtain effective social support, and compliance with psychotropic medications if prescribed.;Long Term: Emotional wellbeing is indicated by absence of clinically significant psychosocial distress or social isolation.          Core Components/Risk Factors/Patient Goals Review:    Goals and Risk Factor Review     Row Name 11/24/23 0856             Core Components/Risk Factors/Patient Goals Review   Personal Goals Review Weight Management/Obesity;Improve shortness of breath with ADL's;Develop more efficient breathing techniques such as purse lipped breathing and diaphragmatic breathing and practicing self-pacing with activity.;Hypertension;Stress       Review Monthly review of patient's Core Components/Risk Factors/Patient Goals are as follows: Goal progressing for weight loss. Federica has met with the staff dietitian on ways to facilitate her weight loss goals.  Goal progressing for improving shortness of breath with ADL's. Vanissa is currently using 2-3 L O2 while exercising to keep sats >90%. Goal progressing for developing more efficient breathing techniques such as purse lipped breathing and diaphragmatic breathing; and practicing self-pacing with activity. Goal progressing for hypertension. Roseanna's meds have been adjusted in hopes to have better B/P control. Goal progressing for stress. We will continue to monitor Allanah's progress throughout the program.       Expected Outcomes To improve shortness of breath with ADL's, develop more efficient breathing techniques such as purse lipped breathing and diaphragmatic breathing; and practicing self-pacing with activity, lose weight, have better B/P control and reduce stress          Core Components/Risk Factors/Patient Goals at Discharge (Final Review):   Goals and Risk Factor Review - 11/24/23 0856       Core Components/Risk Factors/Patient Goals Review   Personal Goals Review Weight Management/Obesity;Improve shortness of breath with ADL's;Develop more efficient breathing techniques such as purse lipped breathing and diaphragmatic breathing and practicing self-pacing with activity.;Hypertension;Stress    Review Monthly review of patient's Core Components/Risk Factors/Patient Goals are as follows: Goal progressing for  weight loss. Mirka has met with the staff dietitian on ways to facilitate her weight loss goals.  Goal progressing for improving shortness of breath with ADL's. Arthea is currently using 2-3 L O2 while exercising to keep sats >90%. Goal progressing for developing more efficient breathing techniques such as purse lipped breathing and diaphragmatic breathing; and practicing self-pacing  with activity. Goal progressing for hypertension. Priscilla's meds have been adjusted in hopes to have better B/P control. Goal progressing for stress. We will continue to monitor Lili's progress throughout the program.    Expected Outcomes To improve shortness of breath with ADL's, develop more efficient breathing techniques such as purse lipped breathing and diaphragmatic breathing; and practicing self-pacing with activity, lose weight, have better B/P control and reduce stress          ITP Comments: Pt is making expected progress toward Pulmonary Rehab goals after completing 6 session(s). Recommend continued exercise, life style modification, education, and utilization of breathing techniques to increase stamina and strength, while also decreasing shortness of breath with exertion.  Dr. Slater Staff is Medical Director for Pulmonary Rehab at [facility].

## 2023-12-02 ENCOUNTER — Other Ambulatory Visit (HOSPITAL_COMMUNITY): Payer: Self-pay

## 2023-12-02 ENCOUNTER — Encounter (HOSPITAL_COMMUNITY): Admission: RE | Admit: 2023-12-02 | Source: Ambulatory Visit

## 2023-12-02 MED ORDER — AZITHROMYCIN 250 MG PO TABS
ORAL_TABLET | ORAL | 0 refills | Status: AC
  Filled 2023-12-02: qty 6, 5d supply, fill #0

## 2023-12-02 MED ORDER — FLUCONAZOLE 150 MG PO TABS
150.0000 mg | ORAL_TABLET | ORAL | 0 refills | Status: DC
  Filled 2023-12-02: qty 2, 14d supply, fill #0

## 2023-12-02 MED ORDER — PREDNISONE 20 MG PO TABS
20.0000 mg | ORAL_TABLET | Freq: Every day | ORAL | 0 refills | Status: DC
  Filled 2023-12-02: qty 10, 10d supply, fill #0

## 2023-12-02 MED ORDER — MICONAZOLE NITRATE 2 % VA CREA
1.0000 | TOPICAL_CREAM | Freq: Every day | VAGINAL | 0 refills | Status: AC
  Filled 2024-01-01: qty 45, 7d supply, fill #0

## 2023-12-06 ENCOUNTER — Encounter (INDEPENDENT_AMBULATORY_CARE_PROVIDER_SITE_OTHER): Payer: Self-pay | Admitting: Physician Assistant

## 2023-12-06 ENCOUNTER — Telehealth (HOSPITAL_COMMUNITY): Payer: Self-pay

## 2023-12-06 ENCOUNTER — Other Ambulatory Visit (HOSPITAL_COMMUNITY): Payer: Self-pay

## 2023-12-06 ENCOUNTER — Ambulatory Visit (INDEPENDENT_AMBULATORY_CARE_PROVIDER_SITE_OTHER): Admitting: Physician Assistant

## 2023-12-06 ENCOUNTER — Encounter: Payer: Self-pay | Admitting: Internal Medicine

## 2023-12-06 VITALS — BP 138/80 | HR 87 | Temp 98.2°F | Ht 65.0 in | Wt 247.0 lb

## 2023-12-06 DIAGNOSIS — E559 Vitamin D deficiency, unspecified: Secondary | ICD-10-CM

## 2023-12-06 DIAGNOSIS — R809 Proteinuria, unspecified: Secondary | ICD-10-CM

## 2023-12-06 DIAGNOSIS — I272 Pulmonary hypertension, unspecified: Secondary | ICD-10-CM | POA: Diagnosis not present

## 2023-12-06 DIAGNOSIS — E669 Obesity, unspecified: Secondary | ICD-10-CM

## 2023-12-06 DIAGNOSIS — E1129 Type 2 diabetes mellitus with other diabetic kidney complication: Secondary | ICD-10-CM

## 2023-12-06 DIAGNOSIS — Z7985 Long-term (current) use of injectable non-insulin antidiabetic drugs: Secondary | ICD-10-CM

## 2023-12-06 DIAGNOSIS — Z6841 Body Mass Index (BMI) 40.0 and over, adult: Secondary | ICD-10-CM

## 2023-12-06 MED ORDER — TIRZEPATIDE 7.5 MG/0.5ML ~~LOC~~ SOAJ
7.5000 mg | SUBCUTANEOUS | 0 refills | Status: DC
  Filled 2023-12-06: qty 2, 28d supply, fill #0

## 2023-12-06 MED ORDER — VITAMIN D (ERGOCALCIFEROL) 1.25 MG (50000 UNIT) PO CAPS
50000.0000 [IU] | ORAL_CAPSULE | ORAL | 0 refills | Status: AC
Start: 2023-12-06 — End: ?
  Filled 2023-12-06: qty 5, 35d supply, fill #0

## 2023-12-06 NOTE — Progress Notes (Signed)
 ADVANCED HF CLINIC NOTE  Primary Care: Georgina Speaks, FNP Primary Cardiologist: Dr. Lonni HF Cardioliogist: Dr. Cherrie  HPI: [name] is a 58 y.o.female with morbid obesity, pulmonary hypertension due iPAH (followed by Dr. Janie at [facility]), RV failure, OSA/OHS on CPAP, breast CA, previous PE.  She is followed closely with Dr. Janie since 2017 for severe IPAH. Initially started on on Remodulin  pump, sildenafil  40 tid, macintentan 10 and sotatercept.  Echo 8/22 LVEF 60-65% RV moderately dilated and HK. TR jet inadequate to estimate RVSP.   Lost to follow up in AHF clinic.  [facility] Cardiac Studies Reviewed: Echo (6/25): EF 55% with mild/mod reduced RV, mild worsening of RV from prior 1/25 RHC (2/25): RA 2, PA 40/22 (29), PCW 5, CO/CI 5.2/2.5 (td) and 5.1/2.4 (f), PA sat 65%, PVR 4.6 WU  RHC (4/23): RHC 06/2021: RA 8, PA 64/30 (41), PCW 12, CO/CI 6.6/3.0 (td) and 5.6/2.6 (f) PA sat 61%, PVR 4.4 (td)   Follow up 6/25 with Dr. Janie, had been transisioned from IV Treprostinil  to PO 3/25. However, has been struggling with GI side effects and dose has been needing titration down. Titrated down again to 8.5 mg tid at last visit. Had been doing well from a cardiopulmonary standpoint.   Seen in AHF 11/04/23, had atypical CP & cramping she attributed to electrolyte disturbance. BP elevated. Zio placed for frequent PVCs.  Zio 8/25 showed mostly NSR, 2 runs of VT, 241 runs SVT, 4.4% PVC burden.  Today she returns for HF follow up.  Feels dizzy and foggy since losartan  switched to telmisartan . BP now 110s/70-80s. Breathing up and down, she attributes breathing to weather. Takes torsemide  1-2x/week PRN. Occasional palpitations. Denies abnormal bleeding, CP, edema, or PND/Orthopnea. Appetite ok. Weight at home 247 pounds. Taking all medications. Wears BiPap at night, usually wears 2-4L with activity. Sometimes up to 6L. Recently switched from semaglutide  to tirzepatide .   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   Menorrhagia    secondary to uterine fibroids   OSA (obstructive sleep apnea)    07/25/13 HST AHI 33/hr, severe hypoxemia O2 min 42% and 95% of the time <89%   PE (pulmonary embolism) 2014   bilateral   Prediabetes    Pulmonary artery hypertension (HCC)    Pulmonary nodule    (5mm on loeft lower lobe) found on CT scan July 2014, repeat scan Jan 2015 showed less than 4mm   Right ovarian cyst    noted 09/2012    S/P TAH (total abdominal hysterectomy) 06/07/2013   Sleep apnea    SOB (shortness of breath) on exertion    Stomach ulcer    Trichomoniasis    05/2011    Vitamin D  deficiency     Current Outpatient Medications  Medication Sig Dispense Refill   acetaminophen  (TYLENOL ) 500 MG tablet Take 1,000 mg by mouth every 6 (six) hours as needed for moderate pain or headache.      albuterol  (ACCUNEB )  1.25 MG/3ML nebulizer solution Take 3 mLs (1.25 mg total) by nebulization every 6 (six) hours as needed for wheezing. 360 mL 12   albuterol  (PROAIR  HFA) 108 (90 Base) MCG/ACT inhaler Inhale 1-2 puffs into the lungs every 6 (six) hours as needed for wheezing or shortness of breath. 18 g 11   Azelastine  HCl 137 MCG/SPRAY SOLN PLACE 1 SPRAY INTO BOTH NOSTRILS 2 (TWO) TIMES DAILY. USE IN EACH NOSTRIL AS DIRECTED 30 mL 4   azithromycin  (ZITHROMAX  Z-PAK) 250 MG tablet Take 2 tablets (500 mg total) by mouth daily for 1 day, THEN 1 tablet (250 mg total) daily for 4 days. 6 tablet 0   chlorpheniramine-HYDROcodone  (TUSSIONEX) 10-8 MG/5ML Take 5 mLs by mouth  every 12 (twelve) hours as needed for cough. 115 mL 0   clotrimazole -betamethasone  (LOTRISONE ) cream Apply 1 application topically 2 (two) times daily. Prn leg dermatitis 45 g 0   Colchicine  (MITIGARE ) 0.6 MG CAPS Take 1 capsule by mouth as needed. As needed gout flare max # of pills 2 in 24 hours 30 capsule 11   fluconazole  (DIFLUCAN ) 150 MG tablet Take 1 tablet (150 mg total) by mouth once a week. 2 tablet 0   fluticasone  (FLONASE ) 50 MCG/ACT nasal spray Place into both nostrils daily as needed for allergies or rhinitis.     Fluticasone -Salmeterol (ADVAIR  HFA IN) Inhale 1 puff into the lungs as needed.     ibuprofen  (ADVIL ) 800 MG tablet Take 1 tablet (800 mg total) by mouth every 8 (eight) hours as needed. 90 tablet 1   linaclotide  (LINZESS ) 72 MCG capsule Take 1 capsule (72 mcg total) by mouth daily before breakfast. 90 capsule 1   loratadine  (CLARITIN ) 10 MG tablet Take 1 tablet (10 mg total) by mouth daily. 90 tablet 3   Magnesium  Cl-Calcium  Carbonate (SLOW MAGNESIUM /CALCIUM ) 70-117 MG TBEC Take 2 tablets by mouth in the morning and at bedtime.     Magnesium  Oxide -Mg Supplement 200 MG CHEW Chew 1 gummy by mouth daily. 30 tablet 3   metoprolol  tartrate (LOPRESSOR ) 25 MG tablet TAKE 1 TABLET BY MOUTH TWICE A DAY 180 tablet 3   miconazole  (MONISTAT  7) 2 % vaginal cream Place 1 Applicatorful vaginally at bedtime. 45 g 0   Multiple Vitamin (MULTIVITAMIN) tablet Take 1 tablet by mouth daily.     omeprazole  (PRILOSEC) 40 MG capsule Take 40 mg by mouth as needed.     OPSUMIT  10 MG TABS Take 10 mg by mouth daily.  8   ORENITRAM  0.25 MG TBCR Take 0.5 mg by mouth.  Take 2 tablets (0.5 mg total) by mouth every eight (8) hours. Take along with 5 mg tablet and four 1 mg tablets, total 8.5 mg every 8 hours     ORENITRAM  1 MG TBCR Take by mouth.     ORENITRAM  5 MG TBCR Take by mouth.     Polyethyl Glycol-Propyl Glycol (SYSTANE OP) Place 1 drop into both eyes 3 (three) times daily as needed (dry/irritated  eyes.).     Potassium Chloride  ER 20 MEQ TBCR Take 1 tablet by mouth as needed.     predniSONE  (DELTASONE ) 20 MG tablet Take 1 tablet (20 mg total) by mouth daily. 10 tablet 0   rivaroxaban  (XARELTO ) 20 MG TABS tablet Take 20 mg by mouth daily with supper.     Semaglutide , 2 MG/DOSE, (OZEMPIC , 2 MG/DOSE,) 8 MG/3ML SOPN Inject 2 mg into the skin once a week. 3 mL 1   sildenafil  (REVATIO ) 20 MG  tablet Take 1-2 tablets (20-40 mg total) by mouth 3 (three) times daily. As of 07/03/20 taking 20 mg tid per [facility] pulm Dr. LEvarge 10 tablet 0   spironolactone  (ALDACTONE ) 50 MG tablet Take 1 tablet (50 mg total) by mouth daily. 90 tablet 3   telmisartan  (MICARDIS ) 40 MG tablet Take 1 tablet (40 mg total) by mouth daily. 90 tablet 3   tirzepatide  (MOUNJARO ) 7.5 MG/0.5ML Pen Inject 7.5 mg into the skin once a week. 2 mL 0   torsemide  (DEMADEX ) 20 MG tablet Take 20 mg by mouth daily as needed.     triamcinolone  cream (KENALOG ) 0.1 % Apply 1 application topically daily as needed (leg discoloration). 454 g 2   Vitamin D , Ergocalciferol , (DRISDOL ) 1.25 MG (50000 UNIT) CAPS capsule Take 1 capsule (50,000 Units total) by mouth every 7 (seven) days. *Need office visit for refills* 5 capsule 0   WINREVAIR 2 x 45 MG subcutaneous injection Inject into the skin.     No current facility-administered medications for this encounter.   Facility-Administered Medications Ordered in Other Encounters  Medication Dose Route Frequency Provider Last Rate Last Admin   heparin  lock flush 100 unit/mL  500 Units Intracatheter Once PRN Odean Potts, MD       sodium chloride  flush (NS) 0.9 % injection 10 mL  10 mL Intracatheter PRN Odean Potts, MD        Allergies  Allergen Reactions   Ampicillin Other (See Comments)    Severe abdominal pain, dizziness (penicillin is okay)   Amoxicillin Other (See Comments)    LIGHT-HEADED and CLOSE TO PASSING OUT  AND ABDOMINAL PAIN Has patient had a PCN reaction causing immediate rash,  facial/tongue/throat swelling, SOB or lightheadedness with hypotension: No Has patient had a PCN reaction causing severe rash involving mucus membranes or skin necrosis: No Has patient had a PCN reaction that required hospitalization No Has patient had a PCN reaction occurring within the last 10 years: No If all of the above answers are NO, then may proceed with Cephalosporin use.   Bismuth -Containing Compounds    Indomethacin  Other (See Comments)    Gastro irritation   Nsaids     GI ulcers   Penicillins    Sulfasalazine    Metronidazole  Nausea And Vomiting   Sulfa Antibiotics Other (See Comments) and Rash    Very bad yeast infection      Social History   Socioeconomic History   Marital status: Single    Spouse name: Not on file   Number of children: 1   Years of education: Not on file   Highest education level: Some college, no degree  Occupational History   Occupation: Film/video editor, pharmacy tech  Tobacco Use   Smoking status: Former    Current packs/day: 0.00    Average packs/day: 0.3 packs/day for 10.0 years (3.3 ttl pk-yrs)    Types: Cigarettes    Start date: 10/03/2001    Quit date: 10/04/2011    Years since quitting: 12.1   Smokeless tobacco: Never   Tobacco comments:    No longer smoking cigarettes  Vaping Use   Vaping status: Never Used  Substance and Sexual Activity   Alcohol use: Yes    Alcohol/week: 0.0 standard drinks of alcohol    Comment: social   Drug use: No   Sexual activity: Not Currently  Other Topics Concern   Not on file  Social History Narrative   Single, lives alone    Lives in [place]  worked Fish farm manager at [employer]    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 [employer]   As of 2022 on disability due to pulm artery htn       Children: boys   Grew up in [place]   Social Drivers of [employer] Strain: Low Risk  (09/14/2023)   Overall Financial Resource Strain (CARDIA)    Difficulty of Paying Living  Expenses: Not hard at all  Food Insecurity: No Food Insecurity (09/14/2023)   Hunger Vital Sign    Worried About Running Out of Food in the Last Year: Never true    Ran Out of Food in the Last Year: Never true  Transportation Needs: No Transportation Needs (09/14/2023)   PRAPARE - Administrator, Civil Service (Medical): No    Lack of Transportation (Non-Medical): No  Physical Activity: Sufficiently Active (09/08/2023)   Exercise Vital Sign    Days of Exercise per Week: 3 days    Minutes of Exercise per Session: 60 min  Stress: Stress Concern Present (09/08/2023)   [employer] of Occupational Health - Occupational Stress Questionnaire    Feeling of Stress : To some extent  Social Connections: Moderately Integrated (09/08/2023)   Social Connection and Isolation Panel    Frequency of Communication with Friends and Family: More than three times a week    Frequency of Social Gatherings with Friends and Family: More than three times a week    Attends Religious Services: More than 4 times per year    Active Member of [employer] or Organizations: Yes    Attends Banker Meetings: More than 4 times per year    Marital Status: Never married  Intimate Partner Violence: Not At Risk (09/14/2023)   Humiliation, Afraid, Rape, and Kick questionnaire    Fear of Current or Ex-Partner: No    Emotionally Abused: No    Physically Abused: No    Sexually Abused: No    Family History  Problem Relation Age of Onset   Hypertension Mother    Kidney disease Mother    Heart disease Mother    Alcoholism Mother    Hypertension Father    Heart disease Father    Stroke Sister    Hypertension Brother    Breast cancer Maternal Grandmother        died at 4   Breast cancer Paternal Grandmother    Cancer Maternal Aunt        unknown form   Breast cancer Paternal Aunt    Breast cancer Paternal Aunt    Breast cancer Cousin        pat first cousin dx in her 46s   Colon cancer Other 74        MGMs brother   Cancer Other        breast ca in GM   Cancer Other        g uncle colon or stomach ca    Wt Readings from Last 3 Encounters:  12/07/23 112.8 kg (248 lb 9.6 oz)  12/06/23 112 kg (247 lb)  11/30/23 114.8 kg (253 lb 1.4 oz)   BP 132/88   Pulse 99   Ht 5' 5 (1.651 m)   Wt 112.8 kg (248 lb 9.6 oz)   LMP 05/11/2013   SpO2 93% Comment: 1 L of  o2  BMI 41.37 kg/m   PHYSICAL EXAM: General:  NAD. No resp difficulty, walked into clinic on oxygen  HEENT: Normal Neck: Supple. No  JVD. Cor: Regular rate & rhythm. No rubs, gallops or murmurs. Lungs: Clear, diminished in bases Abdomen: Soft, obese, nontender, nondistended.  Extremities: No cyanosis, clubbing, rash, edema Neuro: Alert & oriented x 3, moves all 4 extremities w/o difficulty. Affect pleasant.  ASSESSMENT & PLAN: 1. 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 H/o PE but pulmonary angiography 2017 without chronic PE - RHC 2/25 with low filling pressures with slightly lower CI/CO and high PVR from previous (RA 2, PA 40/22 (29), PCW 5, CO/CI 5.2/2.5 (td) and 5.1/2.4 (f), PA sat 65%, PVR 4.6 w) - Echo 6/25: EF 55% with mild/mod reduced RV, mild worsening of RV from prior 1/25 - Stable NYHA II-early III. Volume OK today. - Continue torsemide  20 mg PRN. - Continue spiro 50 mg daily.  - Home O2 requirement 2-6L during day, BiPAP at night; s - Doing Pulm Rehab to assist with O2 wean. - Followed closely at [facility] on therapy prev on IV treprostinil  (started 10/20). Now transitioned to PO Treprostinil  (3/25). Continues on sildenafil  40 tid, sotatercept, and macitentan  10 daily.  - Defer PAH management to Dr. Janie and [facility] team.  - Labs today.  2. OSA - Continue O2 and BiPAP  3. Obesity - Body mass index is 41.37 kg/m. - On tirzepatide   4. PVCs - Zio 7/23 2 runs of SVT and NSVT. PVCs 2.4%  - Zio 8/25: mostly NSR, 4.4% PVC burden, runs of NSVT - Palpitations improved - Continue metoprolol  25 mg  bid  5. HTN - BP better, has not had meds yet today. - With light-headedness, drop telmisartan  to 20 mg daily  6. H/o VTE - H/o chronic PE. Likely due to lining stasis from [facility] as opposed to true CTEPH - Continue Xarelto  20 mg daily. No bleeding issues.  Doing well!  Follow up in 1 year with Dr. Bensimhon  Suleiman Finigan M Macari Zalesky, FNP  12:15 PM

## 2023-12-06 NOTE — Telephone Encounter (Signed)
 Called to confirm/remind patient of their appointment at the [facility] on 12/07/23.   Appointment:   [x] Confirmed  [] Left mess   [] No answer/No voice mail  [] VM Full/unable to leave message  [] Phone not in service  Patient reminded to bring all medications and/or complete list.  Confirmed patient has transportation. Gave directions, instructed to utilize valet parking.

## 2023-12-06 NOTE — Progress Notes (Signed)
 SUBJECTIVE: Discussed the use of AI scribe software for clinical note transcription with the patient, who gave verbal consent to proceed.  Chief Complaint: Obesity  Interim History: She is up 6 lbs since her last visit.   [name] is here to discuss her progress with her obesity treatment plan. She is on the Category 2 Plan and states she is following her eating plan approximately 85 % of the time. She states she is exercising cardio 60 minutes 3 times per week.  [name] is a 58 year old female with obesity, type 2 diabetes, and pulmonary hypertension who presents for follow-up of her obesity treatment plan.  She has a history of type 2 diabetes, hyperlipidemia, pulmonary hypertension, and vitamin D  deficiency. She is currently on Ozempic  2 mg weekly for type 2 diabetes. Previously, she was on Mounjaro  but experienced nausea at higher doses, leading to a switch to Ozempic . She is on a new oral medication for pulmonary hypertension, which initially caused gastrointestinal upset, requiring her to eat more frequently.  She is interested in switching to Zepbound  for weight loss, as a friend has experienced significant weight loss on it. She recalls experiencing nausea when the dose of Mounjaro  was increased in the past, which led to discontinuation. She has three doses of Ozempic  left at home. We discussed that Zepbound  and Mounjaro  are the same medication but for different clinical indications.   Her vitamin D  levels were checked recently,  and shows this examiner results from August 21st 2025 with Vitamin D  level of 32.3 She is currently taking vitamin D  supplements- Ergocalciferol  50000 units once weekly and we will plan to continue to goal of 50-70.  She has been experiencing stress related to her father's living situation. Her father is temporarily staying with her sister, which has been a source of stress due to financial demands from her sister. She is in the process of moving her  father into an apartment in her building to better manage his care and ensure his safety.  She is participating in pulmonary rehabilitation twice a week and is trying to balance this with her regular exercise routine and her father's move. She reports difficulty maintaining her regular exercise schedule due to these commitments.  She has been experiencing weight gain, which she attributes to not taking her water pill regularly due to her father's move. She reports eating on the run and not maintaining a regular meal schedule, which may have contributed to her weight gain. She is on spironolactone  for fluid management but has not been able to take her water pill as scheduled. OBJECTIVE: Visit Diagnoses: Problem List Items Addressed This Visit     Pulmonary HTN (HCC)   Type 2 diabetes mellitus with microalbuminuria, without long-term current use of insulin  (HCC) - Primary   Relevant Medications   tirzepatide  (MOUNJARO ) 7.5 MG/0.5ML Pen   Vitamin D  deficiency   Relevant Medications   Vitamin D , Ergocalciferol , (DRISDOL ) 1.25 MG (50000 UNIT) CAPS capsule   Obesity (HCC)-start bmi 40.10   Relevant Medications   tirzepatide  (MOUNJARO ) 7.5 MG/0.5ML Pen   Other Visit Diagnoses       BMI 40.0-44.9, adult (HCC) Current BMI 41.2       Relevant Medications   tirzepatide  (MOUNJARO ) 7.5 MG/0.5ML Pen     Obesity  Previously on Mounjaro , which resulted in weight loss but caused significant nausea. Currently on Ozempic  at maximum dose with less nausea but less weight loss.  Interested in switching to Zepbound , which is  chemically identical to Mounjaro , for weight loss benefits. Insurance covers Mounjaro  for type 2 diabetes but not Zepbound  for weight loss. Discussed that Mounjaro  and Zepbound  are the same drug, tirzepatide , with different clinical indications. Mounjaro  is covered under type 2 diabetes indication, while Zepbound  is not covered for weight loss or sleep apnea due to insurance  constraints. - Switch from Ozempic  to Mounjaro  at 7.5 mg dose to manage weight and minimize nausea. - Monitor for nausea and adjust dosage if necessary. - Discussed insurance coverage for Mounjaro  under type 2 diabetes indication.  Type 2 diabetes mellitus Type 2 diabetes mellitus managed with Ozempic . A1c was 5.3 in July, indicating good glycemic control. Considering switching to Mounjaro  for both diabetes management and weight loss benefits. Mounjaro  is chemically identical to Zepbound  but covered under type 2 diabetes indication. - Switch from Ozempic  to Mounjaro  at 7.5 mg dose for diabetes management. She is working  on nutrition plan to decrease simple carbohydrates, increase lean proteins and exercise to promote weight loss and improve glycemic control . Meds ordered this encounter  Medications   tirzepatide  (MOUNJARO ) 7.5 MG/0.5ML Pen    Sig: Inject 7.5 mg into the skin once a week.    Dispense:  2 mL    Refill:  0   Vitamin D , Ergocalciferol , (DRISDOL ) 1.25 MG (50000 UNIT) CAPS capsule    Sig: Take 1 capsule (50,000 Units total) by mouth every 7 (seven) days. *Need office visit for refills*    Dispense:  5 capsule    Refill:  0    30 d supply;  ** OV for RF **   Do not send RF request    Pulmonary hypertension Pulmonary hypertension managed with oral medication. Recent adjustment in medication dosage due to gastrointestinal upset, which has improved symptoms. She reports needing to eat more frequently to prevent GI upset, but symptoms are improving with decreased medication dosage. Follows [facility] with Pulmonary at [facility].   Vitamin D  deficiency Vitamin D  deficiency with recent level of 32.3 ng/mL on August 21, indicating low normal range. Continues on vitamin D  supplementation. No N/V or muscle weakness with Ergo.  Continue/refill Ergocalciferol  50000 units once weekly Low vitamin D  levels can be associated with adiposity and may result in leptin resistance and weight gain. Also  associated with fatigue.  Currently on vitamin D  supplementation without any adverse effects such as nausea, vomiting or muscle weakness.    Vitals Temp: 98.2 F (36.8 C) BP: 138/80 Pulse Rate: 87 SpO2: 92 %   Anthropometric Measurements Height: 5' 5 (1.651 m) Weight: 247 lb (112 kg) BMI (Calculated): 41.1 Weight at Last Visit: 241lb Weight Lost Since Last Visit: 0 Weight Gained Since Last Visit: 6lb Starting Weight: 241lb Total Weight Loss (lbs): 0 lb (0 kg) Peak Weight: 256lb   Body Composition  Body Fat %: 50.4 % Fat Mass (lbs): 124.8 lbs Muscle Mass (lbs): 116.8 lbs Total Body Water (lbs): 82.6 lbs Visceral Fat Rating : 16   Other Clinical Data Fasting: no Labs: no Today's Visit #: 29 Starting Date: 07/24/21     ASSESSMENT AND PLAN:  Diet: Courtni is currently in the action stage of change. As such, her goal is to get back to weight loss efforts. She has agreed to Category 2 Plan.  Exercise: Cynethia has been instructed to work up to a goal of 150 minutes of combined cardio and strengthening exercise per week for weight loss and overall health benefits.   Behavior Modification:  We discussed the following  Behavioral Modification Strategies today: increasing lean protein intake, decreasing simple carbohydrates, increasing vegetables, increase H2O intake, increase high fiber foods, no skipping meals, meal planning and cooking strategies, avoiding temptations, planning for success, and decrease junk food . We discussed various medication options to help [facility] with her weight loss efforts and we both agreed to switch to Mounjaro  for primary indication of Type 2 diabetes management.  Return in about 4 weeks (around 01/03/2024).SABRA She was informed of the importance of frequent follow up visits to maximize her success with intensive lifestyle modifications for her multiple health conditions.  Attestation Statements:   Reviewed by clinician on day of visit:  allergies, medications, problem list, medical history, surgical history, family history, social history, and previous encounter notes.   Time spent on visit including pre-visit chart review and post-visit care and charting was 41 minutes.    Tyjai Matuszak, PA-C

## 2023-12-07 ENCOUNTER — Encounter (HOSPITAL_COMMUNITY): Payer: Self-pay

## 2023-12-07 ENCOUNTER — Other Ambulatory Visit (HOSPITAL_COMMUNITY): Payer: Self-pay

## 2023-12-07 ENCOUNTER — Ambulatory Visit (HOSPITAL_COMMUNITY): Payer: Self-pay | Admitting: Family Medicine

## 2023-12-07 ENCOUNTER — Ambulatory Visit (HOSPITAL_BASED_OUTPATIENT_CLINIC_OR_DEPARTMENT_OTHER)
Admission: RE | Admit: 2023-12-07 | Discharge: 2023-12-07 | Disposition: A | Discharge status: Home / Self Care | Source: Ambulatory Visit | Attending: Family Medicine | Admitting: Family Medicine

## 2023-12-07 ENCOUNTER — Encounter (HOSPITAL_COMMUNITY)
Admission: RE | Admit: 2023-12-07 | Discharge: 2023-12-07 | Disposition: A | Discharge status: Home / Self Care | Source: Ambulatory Visit | Attending: Pulmonary Disease

## 2023-12-07 ENCOUNTER — Other Ambulatory Visit (HOSPITAL_COMMUNITY): Payer: Self-pay | Admitting: Family Medicine

## 2023-12-07 VITALS — BP 132/88 | HR 99 | Ht 65.0 in | Wt 248.6 lb

## 2023-12-07 DIAGNOSIS — G4733 Obstructive sleep apnea (adult) (pediatric): Secondary | ICD-10-CM | POA: Diagnosis not present

## 2023-12-07 DIAGNOSIS — Z7985 Long-term (current) use of injectable non-insulin antidiabetic drugs: Secondary | ICD-10-CM | POA: Insufficient documentation

## 2023-12-07 DIAGNOSIS — Z7901 Long term (current) use of anticoagulants: Secondary | ICD-10-CM | POA: Insufficient documentation

## 2023-12-07 DIAGNOSIS — Z8616 Personal history of COVID-19: Secondary | ICD-10-CM | POA: Insufficient documentation

## 2023-12-07 DIAGNOSIS — I272 Pulmonary hypertension, unspecified: Secondary | ICD-10-CM | POA: Insufficient documentation

## 2023-12-07 DIAGNOSIS — Z87891 Personal history of nicotine dependence: Secondary | ICD-10-CM | POA: Insufficient documentation

## 2023-12-07 DIAGNOSIS — Z9981 Dependence on supplemental oxygen: Secondary | ICD-10-CM | POA: Insufficient documentation

## 2023-12-07 DIAGNOSIS — I2721 Secondary pulmonary arterial hypertension: Secondary | ICD-10-CM

## 2023-12-07 DIAGNOSIS — Z86711 Personal history of pulmonary embolism: Secondary | ICD-10-CM | POA: Insufficient documentation

## 2023-12-07 DIAGNOSIS — Z79899 Other long term (current) drug therapy: Secondary | ICD-10-CM | POA: Insufficient documentation

## 2023-12-07 DIAGNOSIS — I5032 Chronic diastolic (congestive) heart failure: Secondary | ICD-10-CM | POA: Insufficient documentation

## 2023-12-07 DIAGNOSIS — Z8249 Family history of ischemic heart disease and other diseases of the circulatory system: Secondary | ICD-10-CM | POA: Insufficient documentation

## 2023-12-07 DIAGNOSIS — Z6841 Body Mass Index (BMI) 40.0 and over, adult: Secondary | ICD-10-CM | POA: Insufficient documentation

## 2023-12-07 DIAGNOSIS — I50810 Right heart failure, unspecified: Secondary | ICD-10-CM | POA: Diagnosis not present

## 2023-12-07 DIAGNOSIS — Z853 Personal history of malignant neoplasm of breast: Secondary | ICD-10-CM | POA: Insufficient documentation

## 2023-12-07 DIAGNOSIS — I493 Ventricular premature depolarization: Secondary | ICD-10-CM | POA: Insufficient documentation

## 2023-12-07 DIAGNOSIS — I110 Hypertensive heart disease with heart failure: Secondary | ICD-10-CM | POA: Insufficient documentation

## 2023-12-07 DIAGNOSIS — I10 Essential (primary) hypertension: Secondary | ICD-10-CM

## 2023-12-07 LAB — BASIC METABOLIC PANEL WITH GFR
Anion gap: 11 (ref 5–15)
BUN: 19 mg/dL (ref 6–20)
CO2: 19 mmol/L — ABNORMAL LOW (ref 22–32)
Calcium: 9.4 mg/dL (ref 8.9–10.3)
Chloride: 107 mmol/L (ref 98–111)
Creatinine, Ser: 0.78 mg/dL (ref 0.44–1.00)
GFR, Estimated: >60 mL/min (ref >60)
Glucose, Bld: 104 mg/dL — ABNORMAL HIGH (ref 70–99)
Potassium: 4.5 mmol/L (ref 3.5–5.1)
Sodium: 137 mmol/L (ref 135–145)

## 2023-12-07 LAB — MAGNESIUM: Magnesium: 1.7 mg/dL (ref 1.7–2.4)

## 2023-12-07 MED ORDER — TELMISARTAN 20 MG PO TABS
20.0000 mg | ORAL_TABLET | Freq: Every day | ORAL | 11 refills | Status: DC
  Filled 2023-12-07: qty 30, 30d supply, fill #0
  Filled 2024-01-26: qty 30, 30d supply, fill #1
  Filled 2024-03-07: qty 30, 30d supply, fill #2

## 2023-12-07 NOTE — Patient Instructions (Signed)
 Medication Changes:  DECREASE TELMISARTAN  TO 20MG  ONCE DAILY   Lab Work:  Labs done today, your results will be available in MyChart, we will contact you for abnormal readings.  Follow-Up in: 1 YEAR WITH DR. CHERRIE PLEASE CALL OUR OFFICE AROUND JULY OF 2026 TO GET SCHEDULED FOR YOUR APPOINTMENT. PHONE NUMBER IS [phone] OPTION 2   At [facility], you and your health needs are our priority. We have a designated team specialized in the treatment of Heart Failure. This Care Team includes your primary Heart Failure Specialized Cardiologist (physician), Advanced Practice Providers (APPs- Physician Assistants and Nurse Practitioners), and Pharmacist who all work together to provide you with the care you need, when you need it.   You may see any of the following providers on your designated Care Team at your next follow up:  Dr. Toribio CHERRIE Dr. Ezra Shuck Dr. Ria Commander Dr. Odis Brownie Greig Mosses, NP Caffie Shed, [place] [facility] [place], [place] Beckey Coe, NP [place] Lee, NP Tinnie Redman, PharmD   Please be sure to bring in all your medications bottles to every appointment.   Need to Contact Us :  If you have any questions or concerns before your next appointment please send us  a message through [place] or call our office at [phone].    TO LEAVE A MESSAGE FOR THE NURSE SELECT OPTION 2, PLEASE LEAVE A MESSAGE INCLUDING: YOUR NAME DATE OF BIRTH CALL BACK NUMBER REASON FOR CALL**this is important as we prioritize the call backs  YOU WILL RECEIVE A CALL BACK THE SAME DAY AS LONG AS YOU CALL BEFORE 4:00 PM

## 2023-12-07 NOTE — Progress Notes (Signed)
 Daily Session Note  Patient Details  Name: [name] MRN: [id number] Date of Birth: Oct 11, 1965 Referring Provider:   Conrad Ports Pulmonary Rehab Walk Test from 11/03/2023 in [facility] for Heart, Vascular, & Lung Health  Referring Provider Levarge    Encounter Date: 12/07/2023  Check In:  Session Check In - 12/07/23 1031       Check-In   Supervising physician immediately available to respond to emergencies [facility] MD immediately available    Physician(s) Josefa Beauvais, NP    Location [facility]    Staff Present Ronal Levin, RN, BSN;Skyylar Kopf Claudene, Neita Moats, MS, ACSM-CEP, Exercise Physiologist;Randi Midge BS, ACSM-CEP, Exercise Physiologist    Virtual Visit No    Medication changes reported     No    Fall or balance concerns reported    No    Tobacco Cessation No Change    Warm-up and Cool-down Performed as group-led instruction    Resistance Training Performed Yes    VAD Patient? No    PAD/SET Patient? No      Pain Assessment   Currently in Pain? No/denies    Multiple Pain Sites No          Capillary Blood Glucose: No results found for this or any previous visit (from the past 24 hours).    Social History   Tobacco Use  Smoking Status Former   Current packs/day: 0.00   Average packs/day: 0.3 packs/day for 10.0 years (3.3 ttl pk-yrs)   Types: Cigarettes   Start date: 10/03/2001   Quit date: 10/04/2011   Years since quitting: 12.1  Smokeless Tobacco Never  Tobacco Comments   No longer smoking cigarettes    Goals Met:  Proper associated with RPD/PD & O2 Sat Independence with exercise equipment Exercise tolerated well No report of concerns or symptoms today Strength training completed today  Goals Unmet:  Not Applicable  Comments: Service time is from 1017 to 1145.    Dr. Slater Staff is Medical Director for Pulmonary Rehab at [facility].

## 2023-12-09 ENCOUNTER — Encounter (HOSPITAL_COMMUNITY)
Admission: RE | Admit: 2023-12-09 | Discharge: 2023-12-09 | Disposition: A | Discharge status: Home / Self Care | Source: Ambulatory Visit | Attending: Pulmonary Disease

## 2023-12-09 DIAGNOSIS — I2721 Secondary pulmonary arterial hypertension: Secondary | ICD-10-CM

## 2023-12-09 NOTE — Progress Notes (Signed)
 Daily Session Note  Patient Details  Name: [name] MRN: [id number] Date of Birth: [birth year] Referring Provider:   Conrad Ports Pulmonary Rehab Walk Test from 11/03/2023 in [facility] for Heart, Vascular, & Lung Health  Referring Provider Levarge    Encounter Date: 12/09/2023  Check In:  Session Check In - 12/09/23 1036       Check-In   Supervising physician immediately available to respond to emergencies [facility] MD immediately available    Physician(s) Orren Fabry, PA    [facility]    Staff Present Ronal Levin, RN, BSN;Casey Claudene, Neita Moats, MS, ACSM-CEP, Exercise Physiologist;Randi Midge BS, ACSM-CEP, Exercise Physiologist    Virtual Visit No    Medication changes reported     No    Fall or balance concerns reported    No    Tobacco Cessation No Change    Warm-up and Cool-down Performed as group-led instruction    Resistance Training Performed Yes    VAD Patient? No    PAD/SET Patient? No      Pain Assessment   Currently in Pain? No/denies          Capillary Blood Glucose: No results found for this or any previous visit (from the past 24 hours).    Social History   Tobacco Use  Smoking Status Former   Current packs/day: 0.00   Average packs/day: 0.3 packs/day for 10.0 years (3.3 ttl pk-yrs)   Types: Cigarettes   Start date: 10/03/2001   Quit date: 10/04/2011   Years since quitting: 12.1  Smokeless Tobacco Never  Tobacco Comments   No longer smoking cigarettes    Goals Met:  Independence with exercise equipment Exercise tolerated well No report of concerns or symptoms today Strength training completed today  Goals Unmet:  Not Applicable  Comments: Service time is from 1014 to 1139    Dr. Slater Staff is Medical Director for Pulmonary Rehab at [facility].

## 2023-12-13 ENCOUNTER — Telehealth: Payer: Self-pay

## 2023-12-13 NOTE — Patient Outreach (Signed)
 Placed unsuccessful call to patient today. She requested to reschedule due to being out of town and she is also currently driving. Call rescheduled and confirmed with patient.   Clayborne Ly RN BSN CCM [place]  [facility], [facility] Nurse Care Coordinator  Direct Dial: [phone] Website: [email or web address]

## 2023-12-14 ENCOUNTER — Encounter (HOSPITAL_COMMUNITY)
Admission: RE | Admit: 2023-12-14 | Discharge: 2023-12-14 | Disposition: A | Discharge status: Home / Self Care | Source: Ambulatory Visit | Attending: Pulmonary Disease

## 2023-12-14 VITALS — Wt 254.2 lb

## 2023-12-14 DIAGNOSIS — I2721 Secondary pulmonary arterial hypertension: Secondary | ICD-10-CM | POA: Diagnosis not present

## 2023-12-14 NOTE — Progress Notes (Signed)
 Daily Session Note  Patient Details  Name: [name] MRN: [id number] Date of Birth: [birth year] Referring Provider:   Conrad Ports Pulmonary Rehab Walk Test from 11/03/2023 in [facility] for Heart, Vascular, & Lung Health  Referring Provider Levarge    Encounter Date: 12/14/2023  Check In:  Session Check In - 12/14/23 1129       Check-In   Supervising physician immediately available to respond to emergencies [facility] MD immediately available    Physician(s) Rosabel Mose, NP    [facility]    Staff Present Ronal Levin, RN, BSN;Casey Claudene, Neita Moats, MS, ACSM-CEP, Exercise Physiologist;Randi Midge HECKLE, ACSM-CEP, Exercise Physiologist;Joseph Lennon, RN, BSN    Virtual Visit No    Medication changes reported     No    Fall or balance concerns reported    No    Tobacco Cessation No Change    Warm-up and Cool-down Performed as group-led instruction    Resistance Training Performed Yes    VAD Patient? No    PAD/SET Patient? No      Pain Assessment   Currently in Pain? No/denies    Multiple Pain Sites No          Capillary Blood Glucose: No results found for this or any previous visit (from the past 24 hours).   Exercise Prescription Changes - 12/14/23 1200       Response to Exercise   Blood Pressure (Admit) 130/80    Blood Pressure (Exercise) 130/84    Blood Pressure (Exit) 126/70    Heart Rate (Admit) 90 bpm    Heart Rate (Exercise) 114 bpm    Heart Rate (Exit) 86 bpm    Oxygen  Saturation (Admit) 93 %   2L   Oxygen  Saturation (Exercise) 93 %   4L   Oxygen  Saturation (Exit) 97 %   3L   Rating of Perceived Exertion (Exercise) 13    Perceived Dyspnea (Exercise) 1.5    Duration Continue with 30 min of aerobic exercise without signs/symptoms of physical distress.    Intensity THRR unchanged      Progression   Progression Continue to progress workloads to maintain intensity without signs/symptoms of physical  distress.      Resistance Training   Training Prescription Yes    Weight Red bands    Reps 10-15    Time 10 Minutes      Oxygen    Oxygen  Continuous    Liters 2-4      NuStep   Level 2    SPM 96    Minutes 15    METs 1.5      Track   Laps 6    Minutes 15    METs 1.92      Oxygen    Maintain Oxygen  Saturation 88% or higher          Social History   Tobacco Use  Smoking Status Former   Current packs/day: 0.00   Average packs/day: 0.3 packs/day for 10.0 years (3.3 ttl pk-yrs)   Types: Cigarettes   Start date: 10/03/2001   Quit date: 10/04/2011   Years since quitting: 12.2  Smokeless Tobacco Never  Tobacco Comments   No longer smoking cigarettes    Goals Met:  Independence with exercise equipment Exercise tolerated well No report of concerns or symptoms today Strength training completed today  Goals Unmet:  Not Applicable  Comments: Service time is from 1022 to 1134    Dr. Slater Staff  is Wellsite geologist for Pulmonary Rehab at [facility].

## 2023-12-16 ENCOUNTER — Encounter (HOSPITAL_COMMUNITY)
Admission: RE | Admit: 2023-12-16 | Discharge: 2023-12-16 | Disposition: A | Discharge status: Home / Self Care | Source: Ambulatory Visit | Attending: Pulmonary Disease

## 2023-12-16 DIAGNOSIS — I2721 Secondary pulmonary arterial hypertension: Secondary | ICD-10-CM

## 2023-12-16 NOTE — Progress Notes (Signed)
 Daily Session Note  Patient Details  Name: [name] MRN: [id number] Date of Birth: [birth year] Referring Provider:   Conrad Ports Pulmonary Rehab Walk Test from 11/03/2023 in [facility] for Heart, Vascular, & Lung Health  Referring Provider Levarge    Encounter Date: 12/16/2023  Check In:  Session Check In - 12/16/23 1041       Check-In   Supervising physician immediately available to respond to emergencies [facility] immediately available    Physician(s) Damien Braver, NP    [facility]    Staff Present Ronal Levin, RN, BSN;Mayci Haning Claudene, Neita Moats, MS, ACSM-CEP, Exercise Physiologist;Randi Midge HECKLE, ACSM-CEP, Exercise Physiologist    Virtual Visit No    Medication changes reported     No    Fall or balance concerns reported    No    Tobacco Cessation No Change    Warm-up and Cool-down Performed as group-led instruction    Resistance Training Performed Yes    VAD Patient? No    PAD/SET Patient? No      Pain Assessment   Currently in Pain? No/denies    Multiple Pain Sites No          Capillary Blood Glucose: No results found for this or any previous visit (from the past 24 hours).    Social History   Tobacco Use  Smoking Status Former   Current packs/day: 0.00   Average packs/day: 0.3 packs/day for 10.0 years (3.3 ttl pk-yrs)   Types: Cigarettes   Start date: 10/03/2001   Quit date: 10/04/2011   Years since quitting: 12.2  Smokeless Tobacco Never  Tobacco Comments   No longer smoking cigarettes    Goals Met:  Proper associated with RPD/PD & O2 Sat Independence with exercise equipment Exercise tolerated well No report of concerns or symptoms today Strength training completed today  Goals Unmet:  Not Applicable  Comments: Service time is from 1010 to 1148.    Dr. Slater Staff is Medical Director for Pulmonary Rehab at [facility].

## 2023-12-17 ENCOUNTER — Other Ambulatory Visit: Payer: Self-pay | Admitting: Licensed Clinical Social Worker

## 2023-12-21 ENCOUNTER — Encounter (HOSPITAL_COMMUNITY)
Admission: RE | Admit: 2023-12-21 | Discharge: 2023-12-21 | Disposition: A | Discharge status: Home / Self Care | Source: Ambulatory Visit | Attending: Pulmonary Disease | Admitting: Pulmonary Disease

## 2023-12-21 DIAGNOSIS — I2721 Secondary pulmonary arterial hypertension: Secondary | ICD-10-CM

## 2023-12-21 NOTE — Progress Notes (Signed)
 Daily Session Note  Patient Details  Name: [name] MRN: [id number] Date of Birth: May 01, 1965 Referring Provider:   Conrad Ports Pulmonary Rehab Walk Test from 11/03/2023 in [facility] for Heart, Vascular, & Lung Health  Referring Provider Levarge    Encounter Date: 12/21/2023  Check In:  Session Check In - 12/21/23 1102       Check-In   Supervising physician immediately available to respond to emergencies [facility] immediately available    Physician(s) Damien Braver, NP    [facility]    Staff Present Ronal Levin, RN, BSN;Casey Claudene, Neita Moats, MS, ACSM-CEP, Exercise Physiologist;Randi Midge HECKLE, ACSM-CEP, Exercise Physiologist    Virtual Visit No    Medication changes reported     No    Tobacco Cessation No Change    Warm-up and Cool-down Performed as group-led instruction    Resistance Training Performed Yes    VAD Patient? No    PAD/SET Patient? No      Pain Assessment   Currently in Pain? No/denies          Capillary Blood Glucose: No results found for this or any previous visit (from the past 24 hours).    Social History   Tobacco Use  Smoking Status Former   Current packs/day: 0.00   Average packs/day: 0.3 packs/day for 10.0 years (3.3 ttl pk-yrs)   Types: Cigarettes   Start date: 10/03/2001   Quit date: 10/04/2011   Years since quitting: 12.2  Smokeless Tobacco Never  Tobacco Comments   No longer smoking cigarettes    Goals Met:  Independence with exercise equipment Exercise tolerated well No report of concerns or symptoms today Strength training completed today  Goals Unmet:  Not Applicable  Comments: Service time is from 1013 to 1148    Dr. Slater Staff is Medical Director for Pulmonary Rehab at [facility].

## 2023-12-23 ENCOUNTER — Encounter (HOSPITAL_COMMUNITY)
Admission: RE | Admit: 2023-12-23 | Discharge: 2023-12-23 | Disposition: A | Discharge status: Home / Self Care | Source: Ambulatory Visit | Attending: Pulmonary Disease

## 2023-12-23 VITALS — Wt 253.7 lb

## 2023-12-23 DIAGNOSIS — I2721 Secondary pulmonary arterial hypertension: Secondary | ICD-10-CM | POA: Diagnosis not present

## 2023-12-23 NOTE — Progress Notes (Signed)
 Daily Session Note  Patient Details  Name: [name] MRN: [id number] Date of Birth: December 26, 1965 Referring Provider:   Conrad Ports Pulmonary Rehab Walk Test from 11/03/2023 in [facility] for Heart, Vascular, & Lung Health  Referring Provider Levarge    Encounter Date: 12/23/2023  Check In:  Session Check In - 12/23/23 1053       Check-In   Supervising physician immediately available to respond to emergencies [facility] MD immediately available    Physician(s) Lum Louis, NP    [facility]    Staff Present Ronal Levin, RN, BSN;Casey Claudene, RT;Isidra Mings [place] BS, ACSM-CEP, Exercise Physiologist;David Spring, MS, ACSM-CEP, CCRP, Exercise Physiologist    Virtual Visit No    Medication changes reported     No    Fall or balance concerns reported    No    Tobacco Cessation No Change    Warm-up and Cool-down Performed as group-led instruction    Resistance Training Performed Yes    VAD Patient? No    PAD/SET Patient? No      Pain Assessment   Currently in Pain? No/denies    Multiple Pain Sites No          Capillary Blood Glucose: No results found for this or any previous visit (from the past 24 hours).    Social History   Tobacco Use  Smoking Status Former   Current packs/day: 0.00   Average packs/day: 0.3 packs/day for 10.0 years (3.3 ttl pk-yrs)   Types: Cigarettes   Start date: 10/03/2001   Quit date: 10/04/2011   Years since quitting: 12.2  Smokeless Tobacco Never  Tobacco Comments   No longer smoking cigarettes    Goals Met:  Independence with exercise equipment Exercise tolerated well No report of concerns or symptoms today Strength training completed today  Goals Unmet:  Not Applicable  Comments: Service time is from 1013 to 1139.    Dr. Slater Staff is Medical Director for Pulmonary Rehab at [facility].

## 2023-12-24 NOTE — Patient Outreach (Signed)
 Complex Care Management   Visit Note  12/17/2023  Name:  [name] MRN: [id number] DOB: 06/02/1965  Situation: Referral received for Complex Care Management related to Stress I obtained verbal consent from Patient.  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   Menorrhagia    secondary to uterine fibroids   OSA (obstructive sleep apnea)    07/25/13 HST AHI 33/hr, severe hypoxemia O2 min 42% and 95% of the time <89%   PE (pulmonary embolism) 2014   bilateral   Prediabetes    Pulmonary artery hypertension (HCC)    Pulmonary nodule    (5mm on loeft lower lobe) found on CT scan July 2014, repeat scan Jan 2015 showed less than 4mm   Right ovarian cyst    noted 09/2012    S/P TAH (total abdominal hysterectomy) 06/07/2013   Sleep apnea    SOB (shortness of breath) on exertion    Stomach ulcer    Trichomoniasis    05/2011    Vitamin D  deficiency     Assessment: Patient Reported Symptoms:  Cognitive Cognitive Status: No symptoms reported, Alert and oriented to person, place, and time, Normal speech and language skills Cognitive/Intellectual Conditions Management [RPT]: None reported or documented in medical history or problem list      Neurological Neurological Review of Symptoms: Not assessed    HEENT HEENT Symptoms Reported: Not assessed      Cardiovascular Cardiovascular Symptoms Reported: Not assessed    Respiratory Respiratory Symptoms Reported: Not assesed    Endocrine Endocrine Symptoms Reported:  Not assessed    Gastrointestinal Gastrointestinal Symptoms Reported: Not assessed      Genitourinary Genitourinary Symptoms Reported: Not assessed    Integumentary Integumentary Symptoms Reported: Not assessed    Musculoskeletal Musculoskelatal Symptoms Reviewed: Not assessed        Psychosocial Psychosocial Symptoms Reported: Other Other Psychosocial Conditions: Caregiver Stress Behavioral Management Strategies: Coping strategies, Counseling, Support system Major Change/Loss/Stressor/Fears (CP): Medical condition, self, Medical condition, family Techniques to [place] with Loss/Stress/Change: Diversional activities, Spiritual practice(s), Counseling      12/24/2023    PHQ2-9 Depression Screening   Little interest or pleasure in doing things    Feeling down, depressed, or hopeless    PHQ-2 - Total Score    Trouble falling or staying asleep, or sleeping too much    Feeling tired or having little energy    Poor appetite or overeating     Feeling bad about yourself - or that you are a failure or have let yourself or your family down    Trouble concentrating on things, such as reading the newspaper or watching television    Moving or speaking so slowly that other people could have noticed.  Or the opposite - being so fidgety or restless that you have been moving around a lot more than usual    Thoughts that you would be better off dead,  or hurting yourself in some way    PHQ2-9 Total Score    If you checked off any problems, how difficult have these problems made it for you to do your work, take care of things at home, or get along with other people    Depression Interventions/Treatment      There were no vitals filed for this visit.  Medications Reviewed Today     Reviewed by Ezzard Rolin BIRCH, LCSW (Social Worker) on 12/24/23 at 0912  Med List Status: <None>   Medication Order Taking? Sig Documenting Provider Last Dose Status Informant  acetaminophen  (TYLENOL ) 500 MG tablet  [id number]  Take 1,000 mg by mouth every 6 (six) hours as needed for moderate pain or headache.  [provider]  Active Self  albuterol  (ACCUNEB ) 1.25 MG/3ML nebulizer solution [id number]  Take 3 mLs (1.25 mg total) by nebulization every 6 (six) hours as needed for wheezing. Georgina Speaks, FNP  Active   albuterol  (PROAIR  HFA) [phone] Base) MCG/ACT inhaler [id number]  Inhale 1-2 puffs into the lungs every 6 (six) hours as needed for wheezing or shortness of breath. McLean-Scocuzza, Randine SAILOR, MD  Active   Azelastine  HCl 137 MCG/SPRAY SOLN [id number]  PLACE 1 SPRAY INTO BOTH NOSTRILS 2 (TWO) TIMES DAILY. USE IN EACH NOSTRIL AS DIRECTED Webb, Padonda B, FNP  Active   chlorpheniramine-HYDROcodone  (TUSSIONEX) 10-8 MG/5ML [id number]  Take 5 mLs by mouth every 12 (twelve) hours as needed for cough. Georgina Speaks, FNP  Active   clotrimazole -betamethasone  (LOTRISONE ) cream [id number]  Apply 1 application topically 2 (two) times daily. Prn leg dermatitis McLean-Scocuzza, Randine SAILOR, MD  Active   Colchicine  (MITIGARE ) 0.6 MG CAPS [id number]  Take 1 capsule by mouth as needed. As needed gout flare max # of pills 2 in 24 hours McLean-Scocuzza, Randine SAILOR, MD  Active   fluconazole  (DIFLUCAN ) 150 MG tablet [id number]  Take 1 tablet (150 mg total) by mouth once a week.   Active   fluticasone  (FLONASE ) 50 MCG/ACT nasal spray [id number]  Place into both nostrils daily as needed for allergies or rhinitis. [provider]  Active Self  Fluticasone -Salmeterol (ADVAIR  HFA IN) [id number]  Inhale 1 puff into the lungs as needed. [provider]  Active   heparin  lock flush 100 unit/mL [id number]   Odean Potts, MD  Active   ibuprofen  (ADVIL ) 800 MG tablet [id number]  Take 1 tablet (800 mg total) by mouth every 8 (eight) hours as needed. McLean-Scocuzza, Randine SAILOR, MD  Active   linaclotide  (LINZESS ) 72 MCG capsule [id number]  Take 1 capsule (72 mcg total) by mouth daily before breakfast. Georgina Speaks, FNP  Active    loratadine  (CLARITIN ) 10 MG tablet [id number]  Take 1 tablet (10 mg total) by mouth daily. McLean-Scocuzza, Randine SAILOR, MD  Active   Magnesium  Cl-Calcium  Carbonate (SLOW MAGNESIUM /CALCIUM ) 70-117 MG TBEC [id number]  Take 2 tablets by mouth in the morning and at bedtime. Gudena, Vinay, MD  Active Self  Magnesium  Oxide -Mg Supplement 200 MG CHEW [id number]  Chew 1 gummy by mouth daily. Lee, [place], NP  Active   metoprolol  tartrate (LOPRESSOR ) 25 MG tablet [id number]  TAKE 1 TABLET BY MOUTH TWICE A DAY McLean-Scocuzza, Randine SAILOR, MD  Active   miconazole  (MONISTAT  7) 2 % vaginal cream [id number]  Place 1 Applicatorful vaginally at bedtime.   Active   Multiple Vitamin (MULTIVITAMIN) tablet [id number]  Take 1 tablet by mouth daily. [provider]  Active Self  omeprazole  (PRILOSEC) 40 MG capsule [id number]  Take 40 mg by mouth as needed. [provider]  Active   OPSUMIT  10 MG TABS [id number]  Take 10 mg by mouth daily. [provider]  Active Self  ORENITRAM  0.25 MG TBCR [id number]  Take 0.5 mg by mouth.  Take 2 tablets (0.5 mg total) by mouth every eight (8) hours. Take along with 5 mg tablet and four 1 mg tablets, total 8.5 mg every 8 hours [provider]  Active   ORENITRAM  1 MG TBCR [id number]  Take by mouth. [provider]  Active   ORENITRAM  5 MG TBCR [id number]  Take by mouth. [provider]  Active   Polyethyl Glycol-Propyl Glycol (SYSTANE OP) [id number]  Place 1 drop into both eyes 3 (three) times daily as needed (dry/irritated eyes.). [provider]  Active Self  Potassium Chloride  ER 20 MEQ TBCR [id number]  Take 1 tablet by mouth as needed. [provider]  Active   predniSONE  (DELTASONE ) 20 MG tablet [id number]  Take 1 tablet (20 mg total) by mouth daily.   Active     Discontinued 08/17/16 0953   rivaroxaban  (XARELTO ) 20 MG TABS tablet [id number]  Take 20 mg by mouth daily with supper. [provider]  Active Self   Semaglutide , 2 MG/DOSE, (OZEMPIC , 2 MG/DOSE,) 8 MG/3ML SOPN [id number]  Inject 2 mg into the skin once a week.  Patient not taking: Reported on 12/07/2023   Verdon Parry D, MD  Active   sildenafil  (REVATIO ) 20 MG tablet [id number]  Take 1-2 tablets (20-40 mg total) by mouth 3 (three) times daily. As of 07/03/20 taking 20 mg tid per [facility] pulm Dr. LEvarge McLean-Scocuzza, Randine SAILOR, MD  Active   sodium chloride  flush (NS) 0.9 % injection 10 mL [id number]   Odean Potts, MD  Active   spironolactone  (ALDACTONE ) 50 MG tablet [id number]  Take 1 tablet (50 mg total) by mouth daily. McLean-Scocuzza, Randine SAILOR, MD  Active   telmisartan  (MICARDIS ) 20 MG tablet [id number]  Take 1 tablet (20 mg total) by mouth daily. [place], [place], [place]  Active   tirzepatide  (MOUNJARO ) 7.5 MG/0.5ML Pen [id number]  Inject 7.5 mg into the skin once a week. Rayburn, Elouise Phlegm, PA-C  Active   torsemide  (DEMADEX ) 20 MG tablet [id number]  Take 20 mg by mouth daily as needed. [provider]  Active   triamcinolone  cream (KENALOG ) 0.1 % [id number]  Apply 1 application topically daily as needed (leg discoloration). McLean-Scocuzza, Randine SAILOR, MD  Active Self  Vitamin D , Ergocalciferol , (DRISDOL ) 1.25 MG (50000 UNIT) CAPS capsule [id number]  Take 1 capsule (50,000 Units total) by mouth every 7 (seven) days. *Need office visit for refills* Rayburn, Elouise Phlegm, PA-C  Active   WINREVAIR 2 x 45 MG subcutaneous injection [id number]  Inject into the skin. [provider]  Active             Recommendation:   Continue Current Plan of Care  Follow Up Plan:   Telephone follow-up in 1 month  Rolin Kerns, LCSW [facility]  [facility], [facility] Clinical Social Worker Direct Dial: [phone]  Fax: [phone] Website: [email or web address] 9:18 AM

## 2023-12-24 NOTE — Patient Instructions (Signed)
 Visit Information  Thank you for taking time to visit with me today. Please don't hesitate to contact me if I can be of assistance to you before our next scheduled appointment.  Your next care management appointment is by telephone on 10/17 at 10:30 AM  Please call the care guide team at [phone] if you need to cancel, schedule, or reschedule an appointment.   Please call the Suicide and Crisis Lifeline: 988 go to [facility] [address], [place] [phone]) call 911 if you are experiencing a Mental Health or Behavioral Health Crisis or need someone to talk to.  Rolin Kerns, LCSW [place]  [facility], [facility] Clinical Social Worker Direct Dial: [phone]  Fax: [phone] Website: [email or web address] 9:19 AM

## 2023-12-28 ENCOUNTER — Telehealth (HOSPITAL_COMMUNITY): Payer: Self-pay

## 2023-12-28 ENCOUNTER — Encounter (HOSPITAL_COMMUNITY)

## 2023-12-28 NOTE — Telephone Encounter (Signed)
 Patient left message at 8:57am calling out for 10:15 PR class, no reason given.

## 2023-12-29 NOTE — Progress Notes (Signed)
 Pulmonary Individual Treatment Plan  Patient Details  Name: [name] MRN: [id number] Date of Birth: April 26, 1965 Referring Provider:   Conrad Ports Pulmonary Rehab Walk Test from 11/03/2023 in [facility]  Referring Provider Levarge    Initial Encounter Date:  Flowsheet Row Pulmonary Rehab Walk Test from 11/03/2023 in [facility]  Date 11/03/23    Visit Diagnosis: PAH (pulmonary artery hypertension) (HCC)  Patient's Home Medications on Admission:   Current Outpatient Medications:    acetaminophen  (TYLENOL ) 500 MG tablet, Take 1,000 mg by mouth every 6 (six) hours as needed for moderate pain or headache. , Disp: , Rfl:    albuterol  (ACCUNEB ) 1.25 MG/3ML nebulizer solution, Take 3 mLs (1.25 mg total) by nebulization every 6 (six) hours as needed for wheezing., Disp: 360 mL, Rfl: 12   albuterol  (PROAIR  HFA) 108 (90 Base) MCG/ACT inhaler, Inhale 1-2 puffs into the lungs every 6 (six) hours as needed for wheezing or shortness of breath., Disp: 18 g, Rfl: 11   Azelastine  HCl 137 MCG/SPRAY SOLN, PLACE 1 SPRAY INTO BOTH NOSTRILS 2 (TWO) TIMES DAILY. USE IN EACH NOSTRIL AS DIRECTED, Disp: 30 mL, Rfl: 4   chlorpheniramine-HYDROcodone  (TUSSIONEX) 10-8 MG/5ML, Take 5 mLs by mouth every 12 (twelve) hours as needed for cough., Disp: 115 mL, Rfl: 0   clotrimazole -betamethasone  (LOTRISONE ) cream, Apply 1 application topically 2 (two) times daily. Prn leg dermatitis, Disp: 45 g, Rfl: 0   Colchicine  (MITIGARE ) 0.6 MG CAPS, Take 1 capsule by mouth as needed. As needed gout flare max # of pills 2 in 24 hours, Disp: 30 capsule, Rfl: 11   fluconazole  (DIFLUCAN ) 150 MG tablet, Take 1 tablet (150 mg total) by mouth once a week., Disp: 2 tablet, Rfl: 0   fluticasone  (FLONASE ) 50 MCG/ACT nasal spray, Place into both nostrils daily as needed for allergies or rhinitis., Disp: , Rfl:     Fluticasone -Salmeterol (ADVAIR  HFA IN), Inhale 1 puff into the lungs as needed., Disp: , Rfl:    ibuprofen  (ADVIL ) 800 MG tablet, Take 1 tablet (800 mg total) by mouth every 8 (eight) hours as needed., Disp: 90 tablet, Rfl: 1   linaclotide  (LINZESS ) 72 MCG capsule, Take 1 capsule (72 mcg total) by mouth daily before breakfast., Disp: 90 capsule, Rfl: 1   loratadine  (CLARITIN ) 10 MG tablet, Take 1 tablet (10 mg total) by mouth daily., Disp: 90 tablet, Rfl: 3   Magnesium  Cl-Calcium  Carbonate (SLOW MAGNESIUM /CALCIUM ) 70-117 MG TBEC, Take 2 tablets by mouth in the morning and at bedtime., Disp: , Rfl:    Magnesium  Oxide -Mg Supplement 200 MG CHEW, Chew 1 gummy by mouth daily., Disp: 30 tablet, Rfl: 3   metoprolol  tartrate (LOPRESSOR ) 25 MG tablet, TAKE 1 TABLET BY MOUTH TWICE A DAY, Disp: 180 tablet, Rfl: 3   miconazole  (MONISTAT  7) 2 % vaginal cream, Place 1 Applicatorful vaginally at bedtime., Disp: 45 g, Rfl: 0   Multiple Vitamin (MULTIVITAMIN) tablet, Take 1 tablet by mouth daily., Disp: , Rfl:    omeprazole  (PRILOSEC) 40 MG capsule, Take 40 mg by mouth as needed., Disp: , Rfl:    OPSUMIT  10 MG TABS, Take 10 mg by mouth daily., Disp: , Rfl: 8   ORENITRAM  0.25 MG TBCR, Take 0.5 mg by mouth.  Take 2 tablets (0.5 mg total) by mouth every eight (8) hours. Take along with 5 mg tablet and four 1 mg tablets, total 8.5  mg every 8 hours, Disp: , Rfl:    ORENITRAM  1 MG TBCR, Take by mouth., Disp: , Rfl:    ORENITRAM  5 MG TBCR, Take by mouth., Disp: , Rfl:    Polyethyl Glycol-Propyl Glycol (SYSTANE OP), Place 1 drop into both eyes 3 (three) times daily as needed (dry/irritated eyes.)., Disp: , Rfl:    Potassium Chloride  ER 20 MEQ TBCR, Take 1 tablet by mouth as needed., Disp: , Rfl:    predniSONE  (DELTASONE ) 20 MG tablet, Take 1 tablet (20 mg total) by mouth daily., Disp: 10 tablet, Rfl: 0   rivaroxaban  (XARELTO ) 20 MG TABS tablet, Take 20 mg by mouth daily with supper., Disp: , Rfl:    Semaglutide , 2  MG/DOSE, (OZEMPIC , 2 MG/DOSE,) 8 MG/3ML SOPN, Inject 2 mg into the skin once a week. (Patient not taking: Reported on 12/07/2023), Disp: 3 mL, Rfl: 1   sildenafil  (REVATIO ) 20 MG tablet, Take 1-2 tablets (20-40 mg total) by mouth 3 (three) times daily. As of 07/03/20 taking 20 mg tid per [facility] pulm Dr. Janie, Disp: 10 tablet, Rfl: 0   spironolactone  (ALDACTONE ) 50 MG tablet, Take 1 tablet (50 mg total) by mouth daily., Disp: 90 tablet, Rfl: 3   telmisartan  (MICARDIS ) 20 MG tablet, Take 1 tablet (20 mg total) by mouth daily., Disp: 30 tablet, Rfl: 11   tirzepatide  (MOUNJARO ) 7.5 MG/0.5ML Pen, Inject 7.5 mg into the skin once a week., Disp: 2 mL, Rfl: 0   torsemide  (DEMADEX ) 20 MG tablet, Take 20 mg by mouth daily as needed., Disp: , Rfl:    triamcinolone  cream (KENALOG ) 0.1 %, Apply 1 application topically daily as needed (leg discoloration)., Disp: 454 g, Rfl: 2   Vitamin D , Ergocalciferol , (DRISDOL ) 1.25 MG (50000 UNIT) CAPS capsule, Take 1 capsule (50,000 Units total) by mouth every 7 (seven) days. *Need office visit for refills*, Disp: 5 capsule, Rfl: 0   WINREVAIR 2 x 45 MG subcutaneous injection, Inject into the skin., Disp: , Rfl:  No current facility-administered medications for this encounter.  Facility-Administered Medications Ordered in Other Encounters:    heparin  lock flush 100 unit/mL, 500 Units, Intracatheter, Once PRN, Gudena, Vinay, MD   sodium chloride  flush (NS) 0.9 % injection 10 mL, 10 mL, Intracatheter, PRN, Gudena, Vinay, MD  Past Medical History: Past Medical History:  Diagnosis Date   Anemia    iron deficiency   Arthritis    Borderline diabetes    Complication of anesthesia    woke up slowly- after hysterectomy- 2015   COVID-19    Diabetes mellitus, type II (HCC)    DVT (deep venous thrombosis) (HCC) 2014   left leg   Family history of breast cancer    Gout    Heart failure (HCC)    HTN (hypertension)    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   Menorrhagia    secondary to uterine fibroids   OSA (obstructive sleep apnea)    07/25/13 HST AHI 33/hr, severe hypoxemia O2 min 42% and 95% of the time <89%   PE (pulmonary embolism) 2014   bilateral   Prediabetes    Pulmonary artery hypertension (HCC)    Pulmonary nodule    (5mm on loeft lower lobe) found on CT scan July 2014, repeat scan Jan 2015 showed less than 4mm   Right ovarian cyst    noted 09/2012    S/P TAH (total abdominal hysterectomy) 06/07/2013   Sleep apnea    SOB (shortness of  breath) on exertion    Stomach ulcer    Trichomoniasis    05/2011    Vitamin D  deficiency     Tobacco Use: Social History   Tobacco Use  Smoking Status Former   Current packs/day: 0.00   Average packs/day: 0.3 packs/day for 10.0 years (3.3 ttl pk-yrs)   Types: Cigarettes   Start date: 10/03/2001   Quit date: 10/04/2011   Years since quitting: 12.2  Smokeless Tobacco Never  Tobacco Comments   No longer smoking cigarettes    Labs: Review Flowsheet  More data exists      Latest Ref Rng & Units 04/30/2022 09/09/2022 11/12/2022 04/13/2023 10/19/2023  Labs for ITP Cardiac and Pulmonary Rehab  Cholestrol 100 - 199 mg/dL 819  808  - 778  751   LDL (calc) 0 - 99 mg/dL 889  879  - 857  835   HDL-C >39 mg/dL 43  49  - 56  43   Trlycerides 0 - 149 mg/dL 848  878  - 868  779   Hemoglobin A1c 4.8 - 5.6 % 5.3  5.1  5.2  5.1  5.3     Capillary Blood Glucose: Lab Results  Component Value Date   GLUCAP 119 (H) 09/09/2017   GLUCAP 113 (H) 09/09/2017   GLUCAP 95 09/02/2017   GLUCAP 108 (H) 07/27/2016   GLUCAP 116 (H) 07/27/2016     Pulmonary Assessment Scores:  Pulmonary Assessment Scores     Row Name 11/03/23 1149         ADL UCSD   ADL Phase Entry     SOB Score total 56       CAT Score   CAT Score 21       mMRC Score   mMRC Score 4       UCSD: Self-administered rating of dyspnea associated with activities of daily living (ADLs) 6-point scale (0 = not  at all to 5 = maximal or unable to do because of breathlessness)  Scoring Scores range from 0 to 120.  Minimally important difference is 5 units  CAT: CAT can identify the health impairment of COPD patients and is better correlated with disease progression.  CAT has a scoring range of zero to 40. The CAT score is classified into four groups of low (less than 10), medium (10 - 20), high (21-30) and very high (31-40) based on the impact level of disease on health status. A CAT score over 10 suggests significant symptoms.  A worsening CAT score could be explained by an exacerbation, poor medication adherence, poor inhaler technique, or progression of COPD or comorbid conditions.  CAT MCID is 2 points  mMRC: mMRC (Modified Medical Research Council) Dyspnea Scale is used to assess the degree of baseline functional disability in patients of respiratory disease due to dyspnea. No minimal important difference is established. A decrease in score of 1 point or greater is considered a positive change.   Pulmonary Function Assessment:  Pulmonary Function Assessment - 11/03/23 1122       Breath   Bilateral Breath Sounds Clear;Decreased    Shortness of Breath Yes;Limiting activity          Exercise Target Goals: Exercise Program Goal: Individual exercise prescription set using results from initial 6 min walk test and THRR while considering  patient's activity barriers and safety.   Exercise Prescription Goal: Initial exercise prescription builds to 30-45 minutes a day of aerobic activity, 2-3 days per week.  Home exercise guidelines will  be given to patient during program as part of exercise prescription that the participant will acknowledge.  Activity Barriers & Risk Stratification:  Activity Barriers & Cardiac Risk Stratification - 11/03/23 1108       Activity Barriers & Cardiac Risk Stratification   Activity Barriers Shortness of Breath;Muscular Weakness;Deconditioning    Cardiac Risk  Stratification Low          6 Minute Walk:  6 Minute Walk     Row Name 11/11/23 1205         6 Minute Walk   Phase Initial     Distance 1020 feet     Walk Time 6 minutes     # of Rest Breaks 0     MPH 1.93     METS 2.6     RPE 13     Perceived Dyspnea  1     VO2 Peak 9.1     Symptoms No     Resting HR 79 bpm     Resting BP 150/90     Resting Oxygen  Saturation  94 %     Exercise Oxygen  Saturation  during 6 min walk 87 %     Max Ex. HR 117 bpm     Max Ex. BP 142/80     2 Minute Post BP 140/80       Interval HR   1 Minute HR 104     2 Minute HR 103     3 Minute HR 103     4 Minute HR 117     5 Minute HR 114     6 Minute HR 114     2 Minute Post HR 83     Interval Heart Rate? Yes       Interval Oxygen    Interval Oxygen ? Yes     Baseline Oxygen  Saturation % 94 %     1 Minute Oxygen  Saturation % 91 %     1 Minute Liters of Oxygen  0 L     2 Minute Oxygen  Saturation % 90 %  87% at 1:26     2 Minute Liters of Oxygen  0 L  increased to 2L     3 Minute Oxygen  Saturation % 89 %     3 Minute Liters of Oxygen  2 L  increased to 4L     4 Minute Oxygen  Saturation % 90 %     4 Minute Liters of Oxygen  4 L     5 Minute Oxygen  Saturation % 91 %     5 Minute Liters of Oxygen  4 L     6 Minute Oxygen  Saturation % 90 %     6 Minute Liters of Oxygen  4 L     2 Minute Post Oxygen  Saturation % 94 %     2 Minute Post Liters of Oxygen  4 L        Oxygen  Initial Assessment:  Oxygen  Initial Assessment - 11/11/23 1208       Initial 6 min Walk   Oxygen  Used Continuous    Liters per minute 4          Oxygen  Re-Evaluation:  Oxygen  Re-Evaluation     Row Name 11/11/23 1208 11/23/23 1637 12/17/23 1101         Program Oxygen  Prescription   Program Oxygen  Prescription Continuous Continuous Continuous     Liters per minute 4 4 4        Home Oxygen    Home Oxygen  Device Home Concentrator  d  tanks with pulse regulator Home Concentrator  d tanks with pulse regulator Home  Concentrator  d tanks with pulse regulator     Sleep Oxygen  Prescription BiPAP BiPAP BiPAP     Liters per minute 6 6 6      Home Exercise Oxygen  Prescription Pulsed  d tank with pulse regulator Pulsed  d tank with pulse regulator Pulsed  d tank with pulse regulator     Liters per minute 4 4 4      Home Resting Oxygen  Prescription Continuous Continuous Continuous     Liters per minute 2 2 2      Compliance with Home Oxygen  Use Yes Yes Yes       Goals/Expected Outcomes   Short Term Goals To learn and exhibit compliance with exercise, home and travel O2 prescription;To learn and understand importance of monitoring SPO2 with pulse oximeter and demonstrate accurate use of the pulse oximeter.;To learn and understand importance of maintaining oxygen  saturations>88%;To learn and demonstrate proper pursed lip breathing techniques or other breathing techniques. ;To learn and demonstrate proper use of respiratory medications To learn and exhibit compliance with exercise, home and travel O2 prescription;To learn and understand importance of monitoring SPO2 with pulse oximeter and demonstrate accurate use of the pulse oximeter.;To learn and understand importance of maintaining oxygen  saturations>88%;To learn and demonstrate proper pursed lip breathing techniques or other breathing techniques. ;To learn and demonstrate proper use of respiratory medications To learn and exhibit compliance with exercise, home and travel O2 prescription;To learn and understand importance of monitoring SPO2 with pulse oximeter and demonstrate accurate use of the pulse oximeter.;To learn and understand importance of maintaining oxygen  saturations>88%;To learn and demonstrate proper pursed lip breathing techniques or other breathing techniques. ;To learn and demonstrate proper use of respiratory medications     Long  Term Goals Exhibits compliance with exercise, home  and travel O2 prescription;Maintenance of O2 saturations>88%;Compliance with  respiratory medication;Verbalizes importance of monitoring SPO2 with pulse oximeter and return demonstration;Exhibits proper breathing techniques, such as pursed lip breathing or other method taught during program session;Demonstrates proper use of MDI's Exhibits compliance with exercise, home  and travel O2 prescription;Maintenance of O2 saturations>88%;Compliance with respiratory medication;Verbalizes importance of monitoring SPO2 with pulse oximeter and return demonstration;Exhibits proper breathing techniques, such as pursed lip breathing or other method taught during program session;Demonstrates proper use of MDI's Exhibits compliance with exercise, home  and travel O2 prescription;Maintenance of O2 saturations>88%;Compliance with respiratory medication;Verbalizes importance of monitoring SPO2 with pulse oximeter and return demonstration;Exhibits proper breathing techniques, such as pursed lip breathing or other method taught during program session;Demonstrates proper use of MDI's     Goals/Expected Outcomes Compliance and understanding of oxygen  saturation monitoring and breathing techniques to decrease shortness of breath. Compliance and understanding of oxygen  saturation monitoring and breathing techniques to decrease shortness of breath. Compliance and understanding of oxygen  saturation monitoring and breathing techniques to decrease shortness of breath.        Oxygen  Discharge (Final Oxygen  Re-Evaluation):  Oxygen  Re-Evaluation - 12/17/23 1101       Program Oxygen  Prescription   Program Oxygen  Prescription Continuous    Liters per minute 4      Home Oxygen    Home Oxygen  Device Home Concentrator   d tanks with pulse regulator   Sleep Oxygen  Prescription BiPAP    Liters per minute 6    Home Exercise Oxygen  Prescription Pulsed   d tank with pulse regulator   Liters per minute 4    Home Resting Oxygen  Prescription  Continuous    Liters per minute 2    Compliance with Home Oxygen  Use Yes       Goals/Expected Outcomes   Short Term Goals To learn and exhibit compliance with exercise, home and travel O2 prescription;To learn and understand importance of monitoring SPO2 with pulse oximeter and demonstrate accurate use of the pulse oximeter.;To learn and understand importance of maintaining oxygen  saturations>88%;To learn and demonstrate proper pursed lip breathing techniques or other breathing techniques. ;To learn and demonstrate proper use of respiratory medications    Long  Term Goals Exhibits compliance with exercise, home  and travel O2 prescription;Maintenance of O2 saturations>88%;Compliance with respiratory medication;Verbalizes importance of monitoring SPO2 with pulse oximeter and return demonstration;Exhibits proper breathing techniques, such as pursed lip breathing or other method taught during program session;Demonstrates proper use of MDI's    Goals/Expected Outcomes Compliance and understanding of oxygen  saturation monitoring and breathing techniques to decrease shortness of breath.          Initial Exercise Prescription:  Initial Exercise Prescription - 11/03/23 1200       Date of Initial Exercise RX and Referring Provider   Date 11/03/23    Referring Provider Levarge    Expected Discharge Date 02/01/24      Oxygen    Oxygen  Continuous    Liters 4    Maintain Oxygen  Saturation 88% or higher      NuStep   Level 2    SPM 80    Minutes 15    METs 1.5      Track   Minutes 15    METs 2.3      Prescription Details   Frequency (times per week) 2    Duration Progress to 30 minutes of continuous aerobic without signs/symptoms of physical distress      Intensity   THRR 40-80% of Max Heartrate 65-130    Ratings of Perceived Exertion 11-13    Perceived Dyspnea 0-4      Progression   Progression Continue to progress workloads to maintain intensity without signs/symptoms of physical distress.      Resistance Training   Training Prescription Yes    Weight Red  bands    Reps 10-15          Perform Capillary Blood Glucose checks as needed.  Exercise Prescription Changes:   Exercise Prescription Changes     Row Name 11/16/23 1200 11/30/23 1200 12/14/23 1200 12/23/23 1217       Response to Exercise   Blood Pressure (Admit) 148/80 150/70 130/80 110/72    Blood Pressure (Exercise) 158/96 140/84 130/84 --    Blood Pressure (Exit) 136/80 132/80 126/70 136/72    Heart Rate (Admit) 79 bpm 99 bpm 90 bpm 84 bpm    Heart Rate (Exercise) 101 bpm 100 bpm 114 bpm 94 bpm    Heart Rate (Exit) 70 bpm 82 bpm 86 bpm 80 bpm    Oxygen  Saturation (Admit) 95 % 89 % 93 %  2L 93 %  2L    Oxygen  Saturation (Exercise) 92 % 88 % 93 %  4L 91 %  3L    Oxygen  Saturation (Exit) 95 % 94 % 97 %  3L 94 %  RA    Rating of Perceived Exertion (Exercise) 15 13 13 13     Perceived Dyspnea (Exercise) 1 1 1.5 1.5    Duration Continue with 30 min of aerobic exercise without signs/symptoms of physical distress. Continue with 30 min of aerobic exercise without signs/symptoms of physical  distress. Continue with 30 min of aerobic exercise without signs/symptoms of physical distress. Continue with 30 min of aerobic exercise without signs/symptoms of physical distress.    Intensity THRR unchanged THRR unchanged THRR unchanged THRR unchanged      Progression   Progression Continue to progress workloads to maintain intensity without signs/symptoms of physical distress. Continue to progress workloads to maintain intensity without signs/symptoms of physical distress. Continue to progress workloads to maintain intensity without signs/symptoms of physical distress. Continue to progress workloads to maintain intensity without signs/symptoms of physical distress.      Resistance Training   Training Prescription Yes Yes Yes Yes    Weight Red bands Red bands Red bands Red bands    Reps 10-15 10-15 10-15 10-15    Time 10 Minutes 10 Minutes 10 Minutes 10 Minutes      Oxygen    Oxygen  Continuous  Continuous Continuous Continuous    Liters 2-4 2-4 2-4 2-4      Treadmill   MPH -- -- -- 1.6    Grade -- -- -- 1    Minutes -- -- -- 15    METs -- -- -- 2.3      NuStep   Level 2 3 2 4     SPM 98 114 96 112    Minutes 15 15 15 15     METs 1.8 1.7 1.5 2      Track   Laps 7 9 6  --    Minutes 15 15 15  --    METs 2.08 2.38 1.92 --      Oxygen    Maintain Oxygen  Saturation 88% or higher 88% or higher 88% or higher 88% or higher       Exercise Comments:   Exercise Goals and Review:   Exercise Goals     Row Name 11/03/23 1035             Exercise Goals   Increase Physical Activity Yes       Intervention Provide advice, education, support and counseling about physical activity/exercise needs.;Develop an individualized exercise prescription for aerobic and resistive training based on initial evaluation findings, risk stratification, comorbidities and participant's personal goals.       Expected Outcomes Short Term: Attend rehab on a regular basis to increase amount of physical activity.;Long Term: Add in home exercise to make exercise part of routine and to increase amount of physical activity.;Long Term: Exercising regularly at least 3-5 days a week.       Increase Strength and Stamina Yes       Intervention Provide advice, education, support and counseling about physical activity/exercise needs.;Develop an individualized exercise prescription for aerobic and resistive training based on initial evaluation findings, risk stratification, comorbidities and participant's personal goals.       Expected Outcomes Short Term: Increase workloads from initial exercise prescription for resistance, speed, and METs.;Short Term: Perform resistance training exercises routinely during rehab and add in resistance training at home;Long Term: Improve cardiorespiratory fitness, muscular endurance and strength as measured by increased METs and functional capacity ( )       Able to understand and use  rate of perceived exertion (RPE) scale Yes       Intervention Provide education and explanation on how to use RPE scale       Expected Outcomes Short Term: Able to use RPE daily in rehab to express subjective intensity level;Long Term:  Able to use RPE to guide intensity level when exercising independently  Able to understand and use Dyspnea scale Yes       Intervention Provide education and explanation on how to use Dyspnea scale       Expected Outcomes Short Term: Able to use Dyspnea scale daily in rehab to express subjective sense of shortness of breath during exertion;Long Term: Able to use Dyspnea scale to guide intensity level when exercising independently       Knowledge and understanding of Target Heart Rate Range (THRR) Yes       Intervention Provide education and explanation of THRR including how the numbers were predicted and where they are located for reference       Expected Outcomes Short Term: Able to state/look up THRR;Short Term: Able to use daily as guideline for intensity in rehab;Long Term: Able to use THRR to govern intensity when exercising independently       Understanding of Exercise Prescription Yes       Intervention Provide education, explanation, and written materials on patient's individual exercise prescription       Expected Outcomes Short Term: Able to explain program exercise prescription;Long Term: Able to explain home exercise prescription to exercise independently          Exercise Goals Re-Evaluation :  Exercise Goals Re-Evaluation     Row Name 11/23/23 1634 12/17/23 1058           Exercise Goal Re-Evaluation   Exercise Goals Review Increase Physical Activity;Able to understand and use Dyspnea scale;Understanding of Exercise Prescription;Increase Strength and Stamina;Knowledge and understanding of Target Heart Rate Range (THRR);Able to understand and use rate of perceived exertion (RPE) scale Increase Physical Activity;Able to understand and use  Dyspnea scale;Understanding of Exercise Prescription;Increase Strength and Stamina;Knowledge and understanding of Target Heart Rate Range (THRR);Able to understand and use rate of perceived exertion (RPE) scale      Comments Muna has completed 4 exercise sessions. She exercises for 15 min on the track and Nustep. Tabrina averages 2.38 METs on the track and 1.9 METs at level 2 on the Nustep. She performs the warmup and cooldown standing without limitations. Will progress Sonda to treadmill walking. It is too soon to notate any other progressions. Will continue to monitor and progress as able. Charmion has completed 10 exercise sessions. She exercises for 15 min on the treadmill and Nustep. Annie averages 1.92 METs at 1.2 mph on the treadmill and 1.8 METs at level 3 on the Nustep. She performs the warmup and cooldown standing without limitations. She has progressed to treadmill walking. She tolerates the treadmill fair. Her level on the Nustep has also increased with no change in METs. Will continue to monitor and progress as able.      Expected Outcomes Through exercise at rehab and home, the patient will decrease shortness of breath with daily activities and feel confident in carrying out an exercise regimen at home. Through exercise at rehab and home, the patient will decrease shortness of breath with daily activities and feel confident in carrying out an exercise regimen at home.         Discharge Exercise Prescription (Final Exercise Prescription Changes):  Exercise Prescription Changes - 12/23/23 1217       Response to Exercise   Blood Pressure (Admit) 110/72    Blood Pressure (Exit) 136/72    Heart Rate (Admit) 84 bpm    Heart Rate (Exercise) 94 bpm    Heart Rate (Exit) 80 bpm    Oxygen  Saturation (Admit) 93 %   2L  Oxygen  Saturation (Exercise) 91 %   3L   Oxygen  Saturation (Exit) 94 %   RA   Rating of Perceived Exertion (Exercise) 13    Perceived Dyspnea (Exercise) 1.5    Duration  Continue with 30 min of aerobic exercise without signs/symptoms of physical distress.    Intensity THRR unchanged      Progression   Progression Continue to progress workloads to maintain intensity without signs/symptoms of physical distress.      Resistance Training   Training Prescription Yes    Weight Red bands    Reps 10-15    Time 10 Minutes      Oxygen    Oxygen  Continuous    Liters 2-4      Treadmill   MPH 1.6    Grade 1    Minutes 15    METs 2.3      NuStep   Level 4    SPM 112    Minutes 15    METs 2      Oxygen    Maintain Oxygen  Saturation 88% or higher          Nutrition:  Target Goals: Understanding of nutrition guidelines, daily intake of sodium 1500mg , cholesterol 200mg , calories 30% from fat and 7% or less from saturated fats, daily to have 5 or more servings of fruits and vegetables.  Biometrics:    Nutrition Therapy Plan and Nutrition Goals:  Nutrition Therapy & Goals - 11/11/23 1337       Nutrition Therapy   Diet heart healthy diet      Personal Nutrition Goals   Nutrition Goal Patient to improve diet quality by using the plate method as a guide for meal planning to include lean protein/plant protein, fruits, vegetables, whole grains, nonfat dairy as part of a well-balanced diet.    Personal Goal #2 Patient to limit sodium intake to 2300mg  per day    Comments Patient has medical history of OSA, morbid obesity, HTN, pulmonary HTN, PAH, hyperlipidemia, CHF, DM2. Cholesterol is not well controlled. She continues to follow-up with [facility] team and pharmacy regarding elevated blood pressures. She recently started ozempic  to aid with weight loss. Patient will benefit from participation in pulmonary rehab for nutrition education, exercise, and lifestyle modification.      Intervention Plan   Intervention Prescribe, educate and counsel regarding individualized specific dietary modifications aiming towards targeted core components such as weight,  hypertension, lipid management, diabetes, heart failure and other comorbidities.;Nutrition handout(s) given to patient.    Expected Outcomes Short Term Goal: Understand basic principles of dietary content, such as calories, fat, sodium, cholesterol and nutrients.;Long Term Goal: Adherence to prescribed nutrition plan.          Nutrition Assessments:  MEDIFICTS Score Key: >=70 Need to make dietary changes  40-70 Heart Healthy Diet <= 40 Therapeutic Level Cholesterol Diet  Flowsheet Row PULMONARY REHAB OTHER RESPIRATORY from 10/17/2020 in [facility]  Picture Your Plate Total Score on Discharge 65   Picture Your Plate Scores: <59 Unhealthy dietary pattern with much room for improvement. 41-50 Dietary pattern unlikely to meet recommendations for good health and room for improvement. 51-60 More healthful dietary pattern, with some room for improvement.  >60 Healthy dietary pattern, although there may be some specific behaviors that could be improved.    Nutrition Goals Re-Evaluation:  Nutrition Goals Re-Evaluation     Row Name 11/11/23 [phone]  Goals   Current Weight 246 lb 14.6 oz (112 kg)       Comment cholesterol 248, triglycerides 220, LDL 164, A1c WNL       Expected Outcome Patient has medical history of OSA, morbid obesity, HTN, pulmonary HTN, PAH, hyperlipidemia, CHF, DM2. Cholesterol is not well controlled. She continues to follow-up with [facility] team and pharmacy regarding elevated blood pressures. She recently started ozempic  to aid with weight loss. Patient will benefit from participation in pulmonary rehab for nutrition education, exercise, and lifestyle modification.          Nutrition Goals Discharge (Final Nutrition Goals Re-Evaluation):  Nutrition Goals Re-Evaluation - 11/11/23 1337       Goals   Current Weight 246 lb 14.6 oz (112 kg)    Comment cholesterol 248, triglycerides 220, LDL 164, A1c  WNL    Expected Outcome Patient has medical history of OSA, morbid obesity, HTN, pulmonary HTN, PAH, hyperlipidemia, CHF, DM2. Cholesterol is not well controlled. She continues to follow-up with [facility] team and pharmacy regarding elevated blood pressures. She recently started ozempic  to aid with weight loss. Patient will benefit from participation in pulmonary rehab for nutrition education, exercise, and lifestyle modification.          Psychosocial: Target Goals: Acknowledge presence or absence of significant depression and/or stress, maximize coping skills, provide positive support system. Participant is able to verbalize types and ability to use techniques and skills needed for reducing stress and depression.  Initial Review & Psychosocial Screening:  Initial Psych Review & Screening - 11/03/23 1100       Initial Review   Current issues with History of Depression;Current Anxiety/Panic;Current Stress Concerns    Source of Stress Concerns Chronic Illness;Family;Unable to participate in former interests or hobbies    Comments Pt is dealing with stress from her dad recently having a stroke.      Family Dynamics   Good Support System? Yes      Barriers   Psychosocial barriers to participate in program The patient should benefit from training in stress management and relaxation.;Psychosocial barriers identified (see note)      Screening Interventions   Interventions Encouraged to exercise;To provide support and resources with identified psychosocial needs    Expected Outcomes Short Term goal: Utilizing psychosocial counselor, staff and physician to assist with identification of specific Stressors or current issues interfering with healing process. Setting desired goal for each stressor or current issue identified.;Long Term Goal: Stressors or current issues are controlled or eliminated.;Short Term goal: Identification and review with participant of any Quality of Life or Depression concerns  found by scoring the questionnaire.;Long Term goal: The participant improves quality of Life and PHQ9 Scores as seen by post scores and/or verbalization of changes          Quality of Life Scores:  Scores of 19 and below usually indicate a poorer quality of life in these areas.  A difference of  2-3 points is a clinically meaningful difference.  A difference of 2-3 points in the total score of the Quality of Life Index has been associated with significant improvement in overall quality of life, self-image, physical symptoms, and general health in studies assessing change in quality of life.  PHQ-9: Review Flowsheet  More data exists      11/03/2023 10/18/2023 10/06/2023 09/08/2023 04/13/2023  Depression screen PHQ 2/9  Decreased Interest 0 0 0 0 0  Down, Depressed, Hopeless 0 0 0 0 1  PHQ - 2 Score  0 0 0 0 1  Altered sleeping 2 - 0 3 2  Tired, decreased energy 1 - 0 3 1  Change in appetite 1 - 0 0 0  Feeling bad or failure about yourself  0 - 0 0 0  Trouble concentrating 0 - 0 0 0  Moving slowly or fidgety/restless 0 - 0 0 0  Suicidal thoughts 0 - 0 0 0  PHQ-9 Score 4 - 0 6 4  Difficult doing work/chores Somewhat difficult - Not difficult at all Somewhat difficult Somewhat difficult   Interpretation of Total Score  Total Score Depression Severity:  1-4 = Minimal depression, 5-9 = Mild depression, 10-14 = Moderate depression, 15-19 = Moderately severe depression, 20-27 = Severe depression   Psychosocial Evaluation and Intervention:  Psychosocial Evaluation - 11/03/23 1141       Psychosocial Evaluation & Interventions   Interventions Stress management education;Relaxation education;Encouraged to exercise with the program and follow exercise prescription    Comments Initial PHQ 2-9 score is 0/4. She states she feels more stressed than she does depressed. Pt states her dad has recently had a stroke and will be moving in with her sister. This has caused her to feel stressed. Her MD has put  in a referral for a mental health specialist. She does not want to try psychotropic meds at this time. Staff will provide patient with education and techniques on ways to reduce stress.    Expected Outcomes For Sorayah to participate in PR with less stress.    Continue Psychosocial Services  Follow up required by staff          Psychosocial Re-Evaluation:  Psychosocial Re-Evaluation     Row Name 11/24/23 0847 12/20/23 1039           Psychosocial Re-Evaluation   Current issues with History of Depression;Current Stress Concerns History of Depression;Current Stress Concerns      Comments Kamalei denies any new stressors or concerns at this time. She is still dealing with her dad's failing health, but feels like exercising is helping with the stress. Her PCP has put in a referral to a mental health specialist. Monthly psychosocial re-evaluation as follows: Nylan denies any new stressors or concerns currently. She continues to deal with her dad's failing health but feels like exercising is helping with the stress. She states her dad has now moved to a skilled nursing facility. She is now seeing a therapist to help with high levels of stress. Estefania declines any additional resources or referrals at this time.      Expected Outcomes For Hermine to participate in PR with less For Hadessah to continue to exercise for stress relief and to have a positive outlook with good coping skills to manage her stress and any other psychosocial issues that may arise.      Interventions Encouraged to attend Pulmonary Rehabilitation for the exercise Encouraged to attend Pulmonary Rehabilitation for the exercise      Continue Psychosocial Services  Follow up required by staff Follow up required by staff         Psychosocial Discharge (Final Psychosocial Re-Evaluation):  Psychosocial Re-Evaluation - 12/20/23 1039       Psychosocial Re-Evaluation   Current issues with History of Depression;Current Stress Concerns     Comments Monthly psychosocial re-evaluation as follows: Jahni denies any new stressors or concerns currently. She continues to deal with her dad's failing health but feels like exercising is helping with the stress. She states her dad has now  moved to a skilled nursing facility. She is now seeing a therapist to help with high levels of stress. Merced declines any additional resources or referrals at this time.    Expected Outcomes For Georgana to continue to exercise for stress relief and to have a positive outlook with good coping skills to manage her stress and any other psychosocial issues that may arise.    Interventions Encouraged to attend Pulmonary Rehabilitation for the exercise    Continue Psychosocial Services  Follow up required by staff          Education: Education Goals: Education classes will be provided on a weekly basis, covering required topics. Participant will state understanding/return demonstration of topics presented.  Learning Barriers/Preferences:  Learning Barriers/Preferences - 11/03/23 1105       Learning Barriers/Preferences   Learning Barriers Sight    Learning Preferences Written Material;Skilled Demonstration;Individual Instruction          Education Topics: Know Your Numbers Group instruction that is supported by a PowerPoint presentation. Instructor discusses importance of knowing and understanding resting, exercise, and post-exercise oxygen  saturation, heart rate, and blood pressure. Oxygen  saturation, heart rate, blood pressure, rating of perceived exertion, and dyspnea are reviewed along with a normal range for these values.  Flowsheet Row PULMONARY REHAB OTHER RESPIRATORY from 12/16/2023 in [facility] for Heart, Vascular, & Lung Health  Date 12/16/23  Educator EP  Instruction Review Code 1- Verbalizes Understanding    Exercise for the Pulmonary Patient Group instruction that is supported by a PowerPoint presentation.  Instructor discusses benefits of exercise, core components of exercise, frequency, duration, and intensity of an exercise routine, importance of utilizing pulse oximetry during exercise, safety while exercising, and options of places to exercise outside of rehab.  Flowsheet Row PULMONARY REHAB OTHER RESPIRATORY from 12/09/2023 in [facility] for Heart, Vascular, & Lung Health  Date 12/09/23  Educator EP  Instruction Review Code 1- Verbalizes Understanding    MET Level  Group instruction provided by PowerPoint, verbal discussion, and written material to support subject matter. Instructor reviews what METs are and how to increase METs.  Flowsheet Row PULMONARY REHAB OTHER RESPIRATORY from 11/11/2023 in [facility] for Heart, Vascular, & Lung Health  Date 11/11/23  Educator EP  Instruction Review Code 1- Verbalizes Understanding    Pulmonary Medications Verbally interactive group education provided by instructor with focus on inhaled medications and proper administration.   Anatomy and Physiology of the Respiratory System Group instruction provided by PowerPoint, verbal discussion, and written material to support subject matter. Instructor reviews respiratory cycle and anatomical components of the respiratory system and their functions. Instructor also reviews differences in obstructive and restrictive respiratory diseases with examples of each.  Flowsheet Row PULMONARY REHAB OTHER RESPIRATORY from 11/25/2023 in [facility] for Heart, Vascular, & Lung Health  Date 11/25/23  Educator RT  Instruction Review Code 1- Verbalizes Understanding    Oxygen  Safety Group instruction provided by PowerPoint, verbal discussion, and written material to support subject matter. There is an overview of "What is Oxygen " and "Why do we need it".  Instructor also reviews how to create a safe environment for oxygen  use, the importance of using  oxygen  as prescribed, and the risks of noncompliance. There is a brief discussion on traveling with oxygen  and resources the patient may utilize. Flowsheet Row PULMONARY REHAB OTHER RESPIRATORY from 12/23/2023 in [facility] for Heart, Vascular, & Lung Health  Date 12/23/23  Educator RN  Instruction Review Code 1- Verbalizes Understanding    Oxygen  Use Group instruction provided by PowerPoint, verbal discussion, and written material to discuss how supplemental oxygen  is prescribed and different types of oxygen  supply systems. Resources for more information are provided.    Breathing Techniques Group instruction that is supported by demonstration and informational handouts. Instructor discusses the benefits of pursed lip and diaphragmatic breathing and detailed demonstration on how to perform both.     Risk Factor Reduction Group instruction that is supported by a PowerPoint presentation. Instructor discusses the definition of a risk factor, different risk factors for pulmonary disease, and how the heart and lungs work together. Flowsheet Row PULMONARY REHAB OTHER RESPIRATORY from 10/17/2020 in [facility] for Heart, Vascular, & Lung Health  Date 09/19/20  Educator handout    Pulmonary Diseases Group instruction provided by PowerPoint, verbal discussion, and written material to support subject matter. Instructor gives an overview of the different type of pulmonary diseases. There is also a discussion on risk factors and symptoms as well as ways to manage the diseases. Flowsheet Row PULMONARY REHAB OTHER RESPIRATORY from 11/18/2023 in [facility] for Heart, Vascular, & Lung Health  Date 11/18/23  Educator RT  Instruction Review Code 1- Verbalizes Understanding    Stress and Energy Conservation Group instruction provided by PowerPoint, verbal discussion, and written material to support subject matter. Instructor gives an  overview of stress and the impact it can have on the body. Instructor also reviews ways to reduce stress. There is also a discussion on energy conservation and ways to conserve energy throughout the day.   Warning Signs and Symptoms Group instruction provided by PowerPoint, verbal discussion, and written material to support subject matter. Instructor reviews warning signs and symptoms of stroke, heart attack, cold and flu. Instructor also reviews ways to prevent the spread of infection.   Other Education Group or individual verbal, written, or video instructions that support the educational goals of the pulmonary rehab program. Flowsheet Row PULMONARY REHAB OTHER RESPIRATORY from 10/17/2020 in [facility] for Heart, Vascular, & Lung Health  Date 10/17/20  [facility] Plate Handout]  Educator Meredith-H/O  Instruction Review Code 1- Verbalizes Understanding     Knowledge Questionnaire Score:  Knowledge Questionnaire Score - 11/03/23 1034       Knowledge Questionnaire Score   Pre Score 16/18          Core Components/Risk Factors/Patient Goals at Admission:  Personal Goals and Risk Factors at Admission - 11/03/23 1105       Core Components/Risk Factors/Patient Goals on Admission    Weight Management Yes;Weight Loss    Intervention Weight Management: Develop a combined nutrition and exercise program designed to reach desired caloric intake, while maintaining appropriate intake of nutrient and fiber, sodium and fats, and appropriate energy expenditure required for the weight goal.;Weight Management: Provide education and appropriate resources to help participant work on and attain dietary goals.;Weight Management/Obesity: Establish reasonable short term and long term weight goals.;Obesity: Provide education and appropriate resources to help participant work on and attain dietary goals.    Admit Weight 247 lb 9.2 oz (112.3 kg)    Expected Outcomes Short Term: Continue to  assess and modify interventions until short term weight is achieved;Long Term: Adherence to nutrition and physical activity/exercise program aimed toward attainment of established weight goal;Weight Loss: Understanding of general recommendations for a balanced deficit meal plan, which promotes 1-2 lb weight  loss per week and includes a negative energy balance of [phone] kcal/d;Understanding recommendations for meals to include 15-35% energy as protein, 25-35% energy from fat, 35-60% energy from carbohydrates, less than 200mg  of dietary cholesterol, 20-35 gm of total fiber daily;Understanding of distribution of calorie intake throughout the day with the consumption of 4-5 meals/snacks    Improve shortness of breath with ADL's Yes    Intervention Provide education, individualized exercise plan and daily activity instruction to help decrease symptoms of SOB with activities of daily living.    Expected Outcomes Short Term: Improve cardiorespiratory fitness to achieve a reduction of symptoms when performing ADLs;Long Term: Be able to perform more ADLs without symptoms or delay the onset of symptoms    Hypertension Yes    Intervention Provide education on lifestyle modifcations including regular physical activity/exercise, weight management, moderate sodium restriction and increased consumption of fresh fruit, vegetables, and low fat dairy, alcohol moderation, and smoking cessation.;Monitor prescription use compliance.    Expected Outcomes Short Term: Continued assessment and intervention until BP is < 140/62mm HG in hypertensive participants. < 130/64mm HG in hypertensive participants with diabetes, heart failure or chronic kidney disease.;Long Term: Maintenance of blood pressure at goal levels.    Stress Yes    Intervention Offer individual and/or small group education and counseling on adjustment to heart disease, stress management and health-related lifestyle change. Teach and support self-help  strategies.;Refer participants experiencing significant psychosocial distress to appropriate mental health specialists for further evaluation and treatment. When possible, include family members and significant others in education/counseling sessions.    Expected Outcomes Short Term: Participant demonstrates changes in health-related behavior, relaxation and other stress management skills, ability to obtain effective social support, and compliance with psychotropic medications if prescribed.;Long Term: Emotional wellbeing is indicated by absence of clinically significant psychosocial distress or social isolation.          Core Components/Risk Factors/Patient Goals Review:   Goals and Risk Factor Review     Row Name 11/24/23 0856 12/20/23 1042           Core Components/Risk Factors/Patient Goals Review   Personal Goals Review Weight Management/Obesity;Improve shortness of breath with ADL's;Develop more efficient breathing techniques such as purse lipped breathing and diaphragmatic breathing and practicing self-pacing with activity.;Hypertension;Stress Weight Management/Obesity;Improve shortness of breath with ADL's;Develop more efficient breathing techniques such as purse lipped breathing and diaphragmatic breathing and practicing self-pacing with activity.;Hypertension;Stress      Review Monthly review of patient's Core Components/Risk Factors/Patient Goals are as follows: Goal progressing for weight loss. Braniya has met with the staff dietitian on ways to facilitate her weight loss goals.  Goal progressing for improving shortness of breath with ADL's. Tifani is currently using 2-3 L O2 while exercising to keep sats >90%. Goal progressing for developing more efficient breathing techniques such as purse lipped breathing and diaphragmatic breathing; and practicing self-pacing with activity. Goal progressing for hypertension. Kaislee's meds have been adjusted in hopes to have better B/P control. Goal  progressing for stress. We will continue to monitor Renna's progress throughout the program. Monthly review of patient's Core Components/Risk Factors/Patient Goals are as follows: Goal progressing for weight loss. Tiphani has met with the staff dietitian on ways to facilitate her weight loss goals. She is choosing a wide variety of foods and trying not to eat out as much. Currently she is up ~5# since starting the program. Goal progressing for improving shortness of breath with ADL's. Unfortunately, Wilda had to increase her oxygen  from 2-3 L  to now 4L O2 while exercising to keep sats >90%. She has increased her workload, speed, and METS on the Nustep and speed and incline on the treadmill. She is working hard to obtain her goals. Goal met for developing more efficient breathing techniques such as purse lipped breathing and diaphragmatic breathing; and practicing self-pacing with activity. Dickey can perform purse lipped breathing while short of breath. She demonstrated this while performing the warmup and while exercising. She can initiate PLB on her own. She works on diaphragmatic breathing at home. Goal met for controlled hypertension. Christia's meds have been adjusted, and her BP has been WNL for the last 5 weeks. Goal progressing for decreased stress. Xian continues to exercise for stress relief and sees a therapist. We will continue to monitor Fleur's progress throughout the program.      Expected Outcomes To improve shortness of breath with ADL's, develop more efficient breathing techniques such as purse lipped breathing and diaphragmatic breathing; and practicing self-pacing with activity, lose weight, have better B/P control and reduce stress Pt will show progress toward meeting expected goals and outcomes.         Core Components/Risk Factors/Patient Goals at Discharge (Final Review):   Goals and Risk Factor Review - 12/20/23 1042       Core Components/Risk Factors/Patient Goals Review    Personal Goals Review Weight Management/Obesity;Improve shortness of breath with ADL's;Develop more efficient breathing techniques such as purse lipped breathing and diaphragmatic breathing and practicing self-pacing with activity.;Hypertension;Stress    Review Monthly review of patient's Core Components/Risk Factors/Patient Goals are as follows: Goal progressing for weight loss. Karne has met with the staff dietitian on ways to facilitate her weight loss goals. She is choosing a wide variety of foods and trying not to eat out as much. Currently she is up ~5# since starting the program. Goal progressing for improving shortness of breath with ADL's. Unfortunately, Henley had to increase her oxygen  from 2-3 L to now 4L O2 while exercising to keep sats >90%. She has increased her workload, speed, and METS on the Nustep and speed and incline on the treadmill. She is working hard to obtain her goals. Goal met for developing more efficient breathing techniques such as purse lipped breathing and diaphragmatic breathing; and practicing self-pacing with activity. Dickey can perform purse lipped breathing while short of breath. She demonstrated this while performing the warmup and while exercising. She can initiate PLB on her own. She works on diaphragmatic breathing at home. Goal met for controlled hypertension. Elianie's meds have been adjusted, and her BP has been WNL for the last 5 weeks. Goal progressing for decreased stress. Sumayya continues to exercise for stress relief and sees a therapist. We will continue to monitor Missie's progress throughout the program.    Expected Outcomes Pt will show progress toward meeting expected goals and outcomes.          ITP Comments: Pt is making expected progress toward Pulmonary Rehab goals after completing 12 session(s). Recommend continued exercise, life style modification, education, and utilization of breathing techniques to increase stamina and strength, while also  decreasing shortness of breath with exertion.  Dr. Slater Staff is Medical Director for Pulmonary Rehab at [facility].

## 2023-12-30 ENCOUNTER — Encounter (HOSPITAL_COMMUNITY)
Admission: RE | Admit: 2023-12-30 | Discharge: 2023-12-30 | Disposition: A | Discharge status: Home / Self Care | Source: Ambulatory Visit | Attending: Pulmonary Disease | Admitting: Pulmonary Disease

## 2023-12-30 DIAGNOSIS — I2721 Secondary pulmonary arterial hypertension: Secondary | ICD-10-CM | POA: Insufficient documentation

## 2023-12-30 NOTE — Progress Notes (Signed)
 Daily Session Note  Patient Details  Name: [name] MRN: [id number] Date of Birth: [birth year] Referring Provider:   Conrad Ports Pulmonary Rehab Walk Test from 11/03/2023 in [facility]  Referring Provider Levarge    Encounter Date: 12/30/2023  Check In:  Session Check In - 12/30/23 1042       Check-In   Supervising physician immediately available to respond to emergencies [facility] MD immediately available    Physician(s) Jackee Alberts, NP    Location [facility]    Staff Present Ronal Levin, RN, BSN;Casey Smith, RT;David Janann, MS, ACSM-CEP, CCRP, Exercise Physiologist;Jetta Walker BS, ACSM-CEP, Exercise Physiologist    Virtual Visit No    Medication changes reported     No    Fall or balance concerns reported    No    Tobacco Cessation No Change    Warm-up and Cool-down Performed as group-led instruction    Resistance Training Performed Yes    VAD Patient? No    PAD/SET Patient? No      Pain Assessment   Currently in Pain? No/denies    Pain Score 0-No pain    Multiple Pain Sites No          Capillary Blood Glucose: No results found for this or any previous visit (from the past 24 hours).    Social History   Tobacco Use  Smoking Status Former   Current packs/day: 0.00   Average packs/day: 0.3 packs/day for 10.0 years (3.3 ttl pk-yrs)   Types: Cigarettes   Start date: 10/03/2001   Quit date: 10/04/2011   Years since quitting: 12.2  Smokeless Tobacco Never  Tobacco Comments   No longer smoking cigarettes    Goals Met:  Proper associated with RPD/PD & O2 Sat Independence with exercise equipment Exercise tolerated well No report of concerns or symptoms today Strength training completed today  Goals Unmet:  Not Applicable  Comments: Service time is from 1018 to 1141.    Dr. Slater Staff is Medical Director for Pulmonary Rehab at [facility].

## 2023-12-31 ENCOUNTER — Other Ambulatory Visit: Payer: Self-pay

## 2023-12-31 ENCOUNTER — Other Ambulatory Visit (HOSPITAL_COMMUNITY): Payer: Self-pay

## 2023-12-31 MED ORDER — PREDNISONE 20 MG PO TABS
20.0000 mg | ORAL_TABLET | Freq: Every day | ORAL | 0 refills | Status: DC
  Filled 2023-12-31: qty 10, 10d supply, fill #0

## 2023-12-31 MED ORDER — HYDROXYZINE HCL 25 MG PO TABS
25.0000 mg | ORAL_TABLET | Freq: Three times a day (TID) | ORAL | 1 refills | Status: AC | PRN
  Filled 2023-12-31 (×2): qty 90, 30d supply, fill #0

## 2023-12-31 MED ORDER — LINZESS 145 MCG PO CAPS
145.0000 ug | ORAL_CAPSULE | Freq: Every day | ORAL | 3 refills | Status: AC
  Filled 2023-12-31: qty 30, 30d supply, fill #0

## 2024-01-01 ENCOUNTER — Other Ambulatory Visit (HOSPITAL_BASED_OUTPATIENT_CLINIC_OR_DEPARTMENT_OTHER): Payer: Self-pay

## 2024-01-01 ENCOUNTER — Encounter: Payer: Self-pay | Admitting: Internal Medicine

## 2024-01-02 NOTE — Progress Notes (Unsigned)
 SUBJECTIVE: Discussed the use of AI scribe software for clinical note transcription with the patient, who gave verbal consent to proceed.  Chief Complaint: Obesity  Interim History: She is up 6 lbs since her last visit.   [name] is here to discuss her progress with her obesity treatment plan. She is on the Category 2 Plan and states she is following her eating plan approximately 80 % of the time. She states she is exercising cardio 60 minutes 3-4 times per week.  She reports hunger not always well controlled through out the day and has been waking up and eating at night at times. No excessive cravings, just hunger.   She has had increased stress moving her Dad into a apartment close to her and was skipping meals during this busy, stressful time, but has gotten back on track with nutrition plan overall.   She is getting back on track with her usual exercise programs as well.   OBJECTIVE: Visit Diagnoses: Problem List Items Addressed This Visit     Pulmonary HTN (HCC)   Type 2 diabetes mellitus with microalbuminuria, without long-term current use of insulin  (HCC) - Primary   Relevant Medications   tirzepatide  (MOUNJARO ) 7.5 MG/0.5ML Pen   Vitamin D  deficiency   Obesity (HCC)-start bmi 40.10   Relevant Medications   tirzepatide  (MOUNJARO ) 7.5 MG/0.5ML Pen   Other Visit Diagnoses       NAFLD (nonalcoholic fatty liver disease)         Hyperlipidemia associated with type 2 diabetes mellitus (HCC)       Relevant Medications   tirzepatide  (MOUNJARO ) 7.5 MG/0.5ML Pen     BMI 40.0-44.9, adult (HCC) Current BMI 42.1       Relevant Medications   tirzepatide  (MOUNJARO ) 7.5 MG/0.5ML Pen      Obesity Recently transitioned to Mounjaro  from Ozempic  at max dose which did not result in clinically significant weight loss.  Struggling with increased hunger, but no side effects with Mounjaro  currently - previously felt nausea limited use of Mounjaro , but willing to try to titrate and monitor  closely.  Discussed importance of regular protein intake, maintaining hydration and avoiding fatty foods.  We discussed the importance of regular meals and adequate protein intake for weight loss and overall health. -Continue/refill Mounjaro  7.5 mg weekly for primary indication of T2DM and monitor tolerance and titrate as indicated.  - Increase adherence to nutrition plan and exercise.   Pulmonary hypertension In Cardio-pulmonary rehab program to assist with O2 wean.  Notes from Follow up in Advanced HF clinic with [facility] 12/07/23: Dr. Yaakov Gainer, NP 1. 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 H/o PE but pulmonary angiography 2017 without chronic PE - RHC 2/25 with low filling pressures with slightly lower CI/CO and high PVR from previous (RA 2, PA 40/22 (29), PCW 5, CO/CI 5.2/2.5 (td) and 5.1/2.4 (f), PA sat 65%, PVR 4.6 w) - Echo 6/25: EF 55% with mild/mod reduced RV, mild worsening of RV from prior 1/25 - Stable NYHA II-early III. Volume OK today. - Continue torsemide  20 mg PRN. - Continue spiro 50 mg daily.  - Home O2 requirement 2-6L during day, BiPAP at night; s - Doing Pulm Rehab to assist with O2 wean. - Followed closely at [facility] on therapy prev on IV treprostinil  (started 10/20). Now transitioned to PO Treprostinil  (3/25). Continues on sildenafil  40 tid, sotatercept, and macitentan  10 daily.  - Defer PAH management to Dr. Janie and [facility] team.  -  Labs today.  Type 2 Diabetes Mellitus with microalbuminuria, without long-term current use of insulin  HgbA1c is at goal. Last A1c was 5.3  Medication(s): Mounjaro  7.5 mg SQ weekly  Denies mass in neck, dysphagia, dyspepsia, persistent hoarseness, abdominal pain, or N/V/Constipation or diarrhea. Has annual eye exam. Mood is stable.   Lab Results  Component Value Date   HGBA1C 5.3 10/19/2023   HGBA1C 5.1 04/13/2023   HGBA1C 5.2 11/12/2022   Lab Results  Component Value Date   MICROALBUR 6.4 07/03/2021    LDLCALC 164 (H) 10/19/2023   CREATININE 0.78 12/07/2023   Lab Results  Component Value Date   GFR 66.99 10/25/2020   GFR 46.84 (L) 01/11/2019   GFR 59.76 (L) 12/22/2018    Plan: Continue and refill Mounjaro  7.5 mg SQ weekly She is working  on nutrition plan to decrease simple carbohydrates, increase lean proteins and exercise to promote weight loss and improve glycemic control . Meds ordered this encounter  Medications   tirzepatide  (MOUNJARO ) 7.5 MG/0.5ML Pen    Sig: Inject 7.5 mg into the skin once a week.    Dispense:  2 mL    Refill:  0   Hyperlipidemia LDL is not at goal. Medication(s): not on statin. On Mounjaro  7.5 mg weekly Cardiovascular risk factors: diabetes mellitus, dyslipidemia, hypertension, microalbuminuria, obesity (BMI >= 30 kg/m2), sedentary lifestyle, and smoking/ tobacco exposure  Lab Results  Component Value Date   CHOL 248 (H) 10/19/2023   HDL 43 10/19/2023   LDLCALC 164 (H) 10/19/2023   TRIG 220 (H) 10/19/2023   CHOLHDL 5.8 (H) 10/19/2023   CHOLHDL 3.9 04/13/2023   CHOLHDL 3.9 09/09/2022   Lab Results  Component Value Date   ALT 12 01/13/2023   AST 15 01/13/2023   ALKPHOS 70 01/13/2023   BILITOT 0.8 01/13/2023   The 10-year ASCVD risk score (Arnett DK, et al., 2019) is: 21.5%   Values used to calculate the score:     Age: 58 years     Clincally relevant sex: Female     Is Non-Hispanic African American: Yes     Diabetic: Yes     Tobacco smoker: No     Systolic Blood Pressure: 134 mmHg     Is BP treated: Yes     HDL Cholesterol: 43 mg/dL     Total Cholesterol: 248 mg/dL  Plan: Not currently on statin therapy. Polypharmacology due to multiple dx as noted.  Will discuss risks and statin therapy further.  On Mounjaro  currently and previously on max dose Ozempic .  Continue to work on nutrition plan -decreasing simple carbohydrates, increasing lean proteins, decreasing saturated fats and cholesterol , avoiding trans fats and exercise as able  to promote weight loss, improve lipids and decrease cardiovascular risks.    Anthropometric Measurements Height: 5' 5 (1.651 m) Weight: 253 lb (114.8 kg) BMI (Calculated): 42.1 Weight at Last Visit: 247 lb Weight Lost Since Last Visit: 0 Weight Gained Since Last Visit: 6 lb Starting Weight: 241 lb Total Weight Loss (lbs): 0 lb (0 kg) Peak Weight: 256 lb   Body Composition  Body Fat %: 50.7 % Fat Mass (lbs): 128.4 lbs Muscle Mass (lbs): 118.4 lbs Total Body Water (lbs): 84.6 lbs Visceral Fat Rating : 16   Other Clinical Data Fasting: No Labs: No Today's Visit #: 30 Starting Date: 07/24/21     ASSESSMENT AND PLAN:  Diet: Gissel is currently in the action stage of change. As such, her goal is to get back to weight  loss efforts. She has agreed to Category 2 Plan.  Exercise: Elvenia has been instructed to continue exercising as is and continue to work with pulmonary rehab for weight loss and overall health benefits.   Behavior Modification:  We discussed the following Behavioral Modification Strategies today: increasing lean protein intake, decreasing simple carbohydrates, increasing vegetables, increase H2O intake, decreasing sodium intake, increase high fiber foods, no skipping meals, meal planning and cooking strategies, better snacking choices, avoiding temptations, and planning for success. We discussed various medication options to help [facility] with her weight loss efforts and we both agreed to continue current treatment plan.  Follow up in 4 weeks. She was informed of the importance of frequent follow up visits to maximize her success with intensive lifestyle modifications for her multiple health conditions.  Attestation Statements:   Reviewed by clinician on day of visit: allergies, medications, problem list, medical history, surgical history, family history, social history, and previous encounter notes.   Time spent on visit including pre-visit chart review and  post-visit care and charting was 35 minutes.    Tiffany Besser, PA-C

## 2024-01-03 ENCOUNTER — Encounter (INDEPENDENT_AMBULATORY_CARE_PROVIDER_SITE_OTHER): Payer: Self-pay | Admitting: Physician Assistant

## 2024-01-03 ENCOUNTER — Other Ambulatory Visit (HOSPITAL_COMMUNITY): Payer: Self-pay

## 2024-01-03 ENCOUNTER — Ambulatory Visit (INDEPENDENT_AMBULATORY_CARE_PROVIDER_SITE_OTHER): Admitting: Physician Assistant

## 2024-01-03 VITALS — BP 134/81 | HR 69 | Temp 97.9°F | Ht 65.0 in | Wt 253.0 lb

## 2024-01-03 DIAGNOSIS — Z6841 Body Mass Index (BMI) 40.0 and over, adult: Secondary | ICD-10-CM

## 2024-01-03 DIAGNOSIS — E559 Vitamin D deficiency, unspecified: Secondary | ICD-10-CM

## 2024-01-03 DIAGNOSIS — K760 Fatty (change of) liver, not elsewhere classified: Secondary | ICD-10-CM

## 2024-01-03 DIAGNOSIS — R809 Proteinuria, unspecified: Secondary | ICD-10-CM | POA: Diagnosis not present

## 2024-01-03 DIAGNOSIS — I50810 Right heart failure, unspecified: Secondary | ICD-10-CM | POA: Diagnosis not present

## 2024-01-03 DIAGNOSIS — I272 Pulmonary hypertension, unspecified: Secondary | ICD-10-CM | POA: Diagnosis not present

## 2024-01-03 DIAGNOSIS — E785 Hyperlipidemia, unspecified: Secondary | ICD-10-CM

## 2024-01-03 DIAGNOSIS — E1169 Type 2 diabetes mellitus with other specified complication: Secondary | ICD-10-CM

## 2024-01-03 DIAGNOSIS — E1129 Type 2 diabetes mellitus with other diabetic kidney complication: Secondary | ICD-10-CM

## 2024-01-03 DIAGNOSIS — Z7985 Long-term (current) use of injectable non-insulin antidiabetic drugs: Secondary | ICD-10-CM

## 2024-01-03 DIAGNOSIS — E669 Obesity, unspecified: Secondary | ICD-10-CM

## 2024-01-03 MED ORDER — TIRZEPATIDE 7.5 MG/0.5ML ~~LOC~~ SOAJ
7.5000 mg | SUBCUTANEOUS | 0 refills | Status: DC
  Filled 2024-01-03: qty 2, 28d supply, fill #0

## 2024-01-04 ENCOUNTER — Encounter (HOSPITAL_COMMUNITY)
Admission: RE | Admit: 2024-01-04 | Discharge: 2024-01-04 | Disposition: A | Source: Ambulatory Visit | Attending: Pulmonary Disease

## 2024-01-04 ENCOUNTER — Other Ambulatory Visit: Payer: Self-pay

## 2024-01-04 DIAGNOSIS — I2721 Secondary pulmonary arterial hypertension: Secondary | ICD-10-CM

## 2024-01-04 NOTE — Progress Notes (Signed)
 Daily Session Note  Patient Details  Name: [name] MRN: [id number] Date of Birth: 1965/08/26 Referring Provider:   Conrad Ports Pulmonary Rehab Walk Test from 11/03/2023 in [facility]  Referring Provider Levarge    Encounter Date: 01/04/2024  Check In:  Session Check In - 01/04/24 1125       Check-In   Supervising physician immediately available to respond to emergencies [facility] MD immediately available    Physician(s) Barnie Hila, NP    Location [facility]    Staff Present Ronal Levin, RN, BSN;Kynadie Yaun Claudene, Adell Music, RN, Maud Moats, MS, ACSM-CEP, Exercise Physiologist    Virtual Visit No    Medication changes reported     No    Fall or balance concerns reported    No    Tobacco Cessation No Change    Warm-up and Cool-down Performed as group-led instruction    Resistance Training Performed Yes    VAD Patient? No    PAD/SET Patient? No      Pain Assessment   Currently in Pain? No/denies    Pain Score 0-No pain    Multiple Pain Sites No          Capillary Blood Glucose: No results found for this or any previous visit (from the past 24 hours).    Social History   Tobacco Use  Smoking Status Former   Current packs/day: 0.00   Average packs/day: 0.3 packs/day for 10.0 years (3.3 ttl pk-yrs)   Types: Cigarettes   Start date: 10/03/2001   Quit date: 10/04/2011   Years since quitting: 12.2  Smokeless Tobacco Never  Tobacco Comments   No longer smoking cigarettes    Goals Met:  Proper associated with RPD/PD & O2 Sat Independence with exercise equipment Exercise tolerated well No report of concerns or symptoms today Strength training completed today  Goals Unmet:  Not Applicable  Comments: Service time is from 1022 to 1138.    Dr. Slater Staff is Medical Director for Pulmonary Rehab at [facility].

## 2024-01-06 ENCOUNTER — Encounter (HOSPITAL_COMMUNITY)
Admission: RE | Admit: 2024-01-06 | Discharge: 2024-01-06 | Disposition: A | Source: Ambulatory Visit | Attending: Pulmonary Disease

## 2024-01-06 VITALS — Wt 254.6 lb

## 2024-01-06 DIAGNOSIS — I2721 Secondary pulmonary arterial hypertension: Secondary | ICD-10-CM | POA: Diagnosis not present

## 2024-01-06 NOTE — Progress Notes (Signed)
 Daily Session Note  Patient Details  Name: [name] MRN: [id number] Date of Birth: 1965-04-06 Referring Provider:   Conrad Ports Pulmonary Rehab Walk Test from 11/03/2023 in [facility]  Referring Provider Levarge    Encounter Date: 01/06/2024  Check In:  Session Check In - 01/06/24 1038       Check-In   Supervising physician immediately available to respond to emergencies [facility] MD immediately available    Physician(s) Josefa Beauvais, NP    [facility]    Staff Present Ronal Levin, RN, BSN;Myalee Stengel Claudene, Neita Moats, MS, ACSM-CEP, Exercise Physiologist;Randi Midge BS, ACSM-CEP, Exercise Physiologist    Virtual Visit No    Medication changes reported     No    Fall or balance concerns reported    No    Tobacco Cessation No Change    Warm-up and Cool-down Performed as group-led instruction    Resistance Training Performed Yes    VAD Patient? No    PAD/SET Patient? No      Pain Assessment   Currently in Pain? No/denies    Multiple Pain Sites No          Capillary Blood Glucose: No results found for this or any previous visit (from the past 24 hours).    Social History   Tobacco Use  Smoking Status Former   Current packs/day: 0.00   Average packs/day: 0.3 packs/day for 10.0 years (3.3 ttl pk-yrs)   Types: Cigarettes   Start date: 10/03/2001   Quit date: 10/04/2011   Years since quitting: 12.2  Smokeless Tobacco Never  Tobacco Comments   No longer smoking cigarettes    Goals Met:  Proper associated with RPD/PD & O2 Sat Independence with exercise equipment Exercise tolerated well No report of concerns or symptoms today Strength training completed today  Goals Unmet:  Not Applicable  Comments: Service time is from 1025 to 1145.    Dr. Slater Staff is Medical Director for Pulmonary Rehab at [facility]

## 2024-01-07 ENCOUNTER — Other Ambulatory Visit (HOSPITAL_COMMUNITY): Payer: Self-pay

## 2024-01-07 MED ORDER — FLUTICASONE-SALMETEROL 250-50 MCG/ACT IN AEPB
1.0000 | INHALATION_SPRAY | Freq: Two times a day (BID) | RESPIRATORY_TRACT | 11 refills | Status: AC
  Filled 2024-01-07: qty 60, 30d supply, fill #0

## 2024-01-07 MED ORDER — FUROSEMIDE 40 MG PO TABS
40.0000 mg | ORAL_TABLET | Freq: Every day | ORAL | 11 refills | Status: DC
  Filled 2024-01-07: qty 30, 30d supply, fill #0

## 2024-01-10 NOTE — Patient Instructions (Signed)
 Visit Information  Thank you for taking time to visit with me today. Please don't hesitate to contact me if I can be of assistance to you before our next scheduled appointment.  Your next care management appointment is by telephone on Wednesday, November 19 at 11:00 AM  Please call the care guide team at [phone] if you need to cancel, schedule, or reschedule an appointment.   Please call 1-800-273-TALK (toll free, 24 hour hotline) if you are experiencing a Mental Health or Behavioral Health Crisis or need someone to talk to.  Clayborne Ly RN BSN CCM [place]  [facility] Nurse Care Coordinator  Direct Dial: [phone] Website: [email or web address]

## 2024-01-10 NOTE — Patient Outreach (Addendum)
 Complex Care Management   Visit Note  01/10/2024  Name:  [name] MRN: [id number] DOB: 1965/12/31  Situation: Referral received for Complex Care Management related to  Hypertensive heart disease with chronic diastolic congestive heart failure; type Diabetes with Microalbuminuria without long term current use of insulin , Pulmonary Hypertension, Situational Depression. I obtained verbal consent from Patient.  Visit completed with Patient on the phone.   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   Menorrhagia    secondary to uterine fibroids   OSA (obstructive sleep apnea)    07/25/13 HST AHI 33/hr, severe hypoxemia O2 min 42% and 95% of the time <89%   PE (pulmonary embolism) 2014   bilateral   Prediabetes    Pulmonary artery hypertension (HCC)    Pulmonary nodule    (5mm on loeft lower lobe) found on CT scan July 2014, repeat scan Jan 2015 showed less than 4mm   Right ovarian cyst    noted 09/2012    S/P TAH (total abdominal hysterectomy) 06/07/2013   Sleep apnea    SOB (shortness of breath) on exertion    Stomach ulcer    Trichomoniasis    05/2011    Vitamin D  deficiency     Assessment: Patient Reported Symptoms:  Cognitive Cognitive Status: Normal speech and language skills, Alert and oriented to person, place, and time Cognitive/Intellectual Conditions Management [RPT]: None reported or documented in medical history or problem list   Health Maintenance Behaviors: Annual physical exam, Healthy diet, Exercise, Immunizations Health Facilitated  by: Healthy diet, Rest  Neurological Neurological Review of Symptoms: No symptoms reported    HEENT HEENT Symptoms Reported: No symptoms reported      Cardiovascular Cardiovascular Symptoms Reported: No symptoms reported Does patient have uncontrolled Hypertension?: No Cardiovascular Management Strategies: Medication therapy, Routine screening Cardiovascular Self-Management Outcome: 4 (good)  Respiratory Respiratory Symptoms Reported: Shortness of breath Respiratory Management Strategies: Oxygen  therapy, Routine screening, Medication therapy, Pulmonary rehab Respiratory Self-Management Outcome: 4 (good)  Endocrine Endocrine Symptoms Reported: No symptoms reported Is patient diabetic?: No    Gastrointestinal Gastrointestinal Symptoms Reported: No symptoms reported      Genitourinary Genitourinary Symptoms Reported: No symptoms reported    Integumentary Integumentary Symptoms Reported: No symptoms reported    Musculoskeletal Musculoskelatal Symptoms Reviewed: No symptoms reported   Falls in the past year?: No Number of falls in past year: 1 or less Was there an injury with Fall?: No Fall Risk Category Calculator: 0 Patient Fall Risk Level: Low Fall Risk Patient at Risk for Falls Due to: No Fall Risks Fall risk Follow up: Falls evaluation completed  Psychosocial Psychosocial Symptoms Reported: No symptoms reported   Major Change/Loss/Stressor/Fears (CP): Medical condition, self, Medical condition, family Techniques to [place] with Loss/Stress/Change: Diversional activities, Counseling, Medication Quality of Family Relationships: involved, helpful, supportive Do you feel physically threatened by others?: No    01/10/2024    PHQ2-9 Depression Screening   [name] interest or pleasure in doing  things    Feeling down, depressed, or hopeless    PHQ-2 - Total Score    Trouble falling or staying asleep, or sleeping too much    Feeling tired or having [name] energy    Poor appetite or  overeating     Feeling bad about yourself - or that you are a failure or have let yourself or your family down    Trouble concentrating on things, such as reading the newspaper or watching television    Moving or speaking so slowly that other people could have noticed.  Or the opposite - being so fidgety or restless that you have been moving around a lot more than usual    Thoughts that you would be better off dead, or hurting yourself in some way    PHQ2-9 Total Score    If you checked off any problems, how difficult have these problems made it for you to do your work, take care of things at home, or get along with other people    Depression Interventions/Treatment      There were no vitals filed for this visit.  Medications Reviewed Today     Reviewed by Tiffany Clayborne CROME, RN (Registered Nurse) on 01/10/24 at 1619  Med List Status: <None>   Medication Order Taking? Sig Documenting Provider Last Dose Status Informant  acetaminophen  (TYLENOL ) 500 MG tablet [id number]  Take 1,000 mg by mouth every 6 (six) hours as needed for moderate pain or headache.  [provider]  Active Self  albuterol  (ACCUNEB ) 1.25 MG/3ML nebulizer solution [id number]  Take 3 mLs (1.25 mg total) by nebulization every 6 (six) hours as needed for wheezing. Tiffany Speaks, FNP  Active   albuterol  (PROAIR  HFA) 108 [phone] Base) MCG/ACT inhaler [id number]  Inhale 1-2 puffs into the lungs every 6 (six) hours as needed for wheezing or shortness of breath. McLean-Scocuzza, Randine SAILOR, MD  Active   Azelastine  HCl 137 MCG/SPRAY SOLN [id number]  PLACE 1 SPRAY INTO BOTH NOSTRILS 2 (TWO) TIMES DAILY. USE IN EACH NOSTRIL AS DIRECTED Webb, Padonda B, FNP  Active   chlorpheniramine-HYDROcodone  (TUSSIONEX) 10-8 MG/5ML [id number]  Take 5 mLs by mouth every 12 (twelve) hours as needed for cough. Tiffany Speaks, FNP  Active   clotrimazole -betamethasone  (LOTRISONE ) cream [id number]  Apply 1 application topically 2 (two) times daily. Prn leg  dermatitis McLean-Scocuzza, Randine SAILOR, MD  Active   Colchicine  (MITIGARE ) 0.6 MG CAPS [id number]  Take 1 capsule by mouth as needed. As needed gout flare max # of pills 2 in 24 hours McLean-Scocuzza, Randine SAILOR, MD  Active   fluconazole  (DIFLUCAN ) 150 MG tablet [id number]  Take 1 tablet (150 mg total) by mouth once a week.   Active   fluticasone  (FLONASE ) 50 MCG/ACT nasal spray [id number]  Place into both nostrils daily as needed for allergies or rhinitis. [provider]  Active Self  Fluticasone -Salmeterol (ADVAIR  HFA IN) [id number]  Inhale 1 puff into the lungs as needed. [provider]  Active   fluticasone -salmeterol (ADVAIR ) 250-50 MCG/ACT AEPB [id number]  Inhale 1 puff into the lungs 2 (two) times daily, in the morning and bedtime   Active   furosemide  (LASIX ) 40 MG tablet [id number]  Take 1 tablet (40 mg total) by mouth daily.   Active   heparin  lock flush 100 unit/mL [id number]   Gudena, Vinay, MD  Active   hydrOXYzine (ATARAX) 25 MG tablet [id number]  Take 1 tablet (25 mg total) by mouth 3 (three) times daily as needed.  Active   ibuprofen  (ADVIL ) 800 MG tablet [id number]  Take 1 tablet (800 mg total) by mouth every 8 (eight) hours as needed. McLean-Scocuzza, Randine SAILOR, MD  Active   linaclotide  (LINZESS ) 145 MCG CAPS capsule [id number]  Take 1 capsule (145 mcg total) by mouth daily.   Active   linaclotide  (LINZESS ) 72 MCG capsule [id number]  Take 1 capsule (72 mcg total) by mouth daily before breakfast. Tiffany Speaks, FNP  Active   loratadine  (CLARITIN ) 10 MG tablet [id number]  Take 1 tablet (10 mg total) by mouth daily. McLean-Scocuzza, Randine SAILOR, MD  Active   Magnesium  Cl-Calcium  Carbonate (SLOW MAGNESIUM /CALCIUM ) 70-117 MG TBEC [id number]  Take 2 tablets by mouth in the morning and at bedtime. Gudena, Vinay, MD  Active Self  Magnesium  Oxide -Mg Supplement 200 MG CHEW [id number]  Chew 1 gummy by mouth daily. Lee, [place], NP  Active   metoprolol  tartrate (LOPRESSOR ) 25 MG tablet [id number]   TAKE 1 TABLET BY MOUTH TWICE A DAY McLean-Scocuzza, Randine SAILOR, MD  Active   miconazole  (MONISTAT  7) 2 % vaginal cream [id number]  Place 1 Applicatorful vaginally at bedtime.   Active   Multiple Vitamin (MULTIVITAMIN) tablet [id number]  Take 1 tablet by mouth daily. [provider]  Active Self  omeprazole  (PRILOSEC) 40 MG capsule [id number]  Take 40 mg by mouth as needed. [provider]  Active   OPSUMIT  10 MG TABS [id number]  Take 10 mg by mouth daily. [provider]  Active Self  ORENITRAM  0.25 MG TBCR [id number]  Take 0.5 mg by mouth.  Take 2 tablets (0.5 mg total) by mouth every eight (8) hours. Take along with 5 mg tablet and four 1 mg tablets, total 8.5 mg every 8 hours [provider]  Active   ORENITRAM  1 MG TBCR [id number]  Take by mouth. [provider]  Active   ORENITRAM  5 MG TBCR [id number]  Take by mouth. [provider]  Active   Polyethyl Glycol-Propyl Glycol (SYSTANE OP) [id number]  Place 1 drop into both eyes 3 (three) times daily as needed (dry/irritated eyes.). [provider]  Active Self  Potassium Chloride  ER 20 MEQ TBCR [id number]  Take 1 tablet by mouth as needed. [provider]  Active   predniSONE  (DELTASONE ) 20 MG tablet [id number]  Take 1 tablet (20 mg total) by mouth daily.   Active     Discontinued 08/17/16 0953   rivaroxaban  (XARELTO ) 20 MG TABS tablet [id number]  Take 20 mg by mouth daily with supper. [provider]  Active Self  sildenafil  (REVATIO ) 20 MG tablet [id number]  Take 1-2 tablets (20-40 mg total) by mouth 3 (three) times daily. As of 07/03/20 taking 20 mg tid per [facility] Dr. LEvarge McLean-Scocuzza, Randine SAILOR, MD  Active   sodium chloride  flush (NS) 0.9 % injection 10 mL [id number]   Odean Potts, MD  Active   spironolactone  (ALDACTONE ) 50 MG tablet [id number]  Take 1 tablet (50 mg total) by mouth daily. McLean-Scocuzza, Randine SAILOR, MD  Active   telmisartan  (MICARDIS ) 20 MG tablet [id number]   Take 1 tablet (20 mg total) by mouth daily. [place], [place], [place]  Active   tirzepatide  (MOUNJARO ) 7.5 MG/0.5ML Pen [id number]  Inject 7.5 mg into the skin once a week. Rayburn, Elouise Phlegm, PA-C  Active   torsemide  (DEMADEX ) 20 MG tablet [id number]  Take 20 mg by mouth daily as needed. [provider]  Active   triamcinolone  cream (KENALOG ) 0.1 % [id number]  Apply 1  application topically daily as needed (leg discoloration). McLean-Scocuzza, Randine SAILOR, MD  Active Self  Vitamin D , Ergocalciferol , (DRISDOL ) 1.25 MG (50000 UNIT) CAPS capsule [id number]  Take 1 capsule (50,000 Units total) by mouth every 7 (seven) days. *Need office visit for refills* Rayburn, Elouise Phlegm, PA-C  Active   WINREVAIR 2 x 45 MG subcutaneous injection [id number]  Inject into the skin. [provider]  Active             Recommendation:   Keep your scheduled follow up visit on 01/07/24 with  PAH (pulmonary artery hypertension) (CMS-HCC)  Chronic right heart failure (CMS-HCC)  Chronic hypoxemic respiratory failure (CMS-HCC)    Procedures  Pulmonary Function Testing  Pulmonary Function Testing  Janie Heron Ruth, MD    Specialty provider follow-up with Pulmonary Rehab as directed   Follow Up Plan:   Telephone follow up appointment with social worker Rolin Kerns LCSW date/time: 01/14/24 at 10:30 AM Telephone follow up appointment with nurse care manager Clayborne Ly RN date/time:  Wednesday, November 19 at 11:00 AM  Clayborne Ly RN BSN CCM [place]  [facility], [facility] Nurse Care Coordinator  Direct Dial: [phone] Website: [email or web address]

## 2024-01-11 ENCOUNTER — Other Ambulatory Visit (HOSPITAL_BASED_OUTPATIENT_CLINIC_OR_DEPARTMENT_OTHER): Payer: Self-pay

## 2024-01-11 ENCOUNTER — Encounter (HOSPITAL_COMMUNITY): Admission: RE | Admit: 2024-01-11 | Source: Ambulatory Visit

## 2024-01-13 ENCOUNTER — Encounter (HOSPITAL_COMMUNITY)
Admission: RE | Admit: 2024-01-13 | Discharge: 2024-01-13 | Disposition: A | Discharge status: Home / Self Care | Source: Ambulatory Visit | Attending: Pulmonary Disease | Admitting: Pulmonary Disease

## 2024-01-13 DIAGNOSIS — I2721 Secondary pulmonary arterial hypertension: Secondary | ICD-10-CM | POA: Diagnosis not present

## 2024-01-13 NOTE — Progress Notes (Signed)
 Daily Session Note  Patient Details  Name: [name] MRN: [id number] Date of Birth: 1965-06-04 Referring Provider:   Conrad Ports Pulmonary Rehab Walk Test from 11/03/2023 in [facility]  Referring Provider [place]    Encounter Date: 01/13/2024  Check In:  Session Check In - 01/13/24 1041       Check-In   Supervising physician immediately available to respond to emergencies [facility] MD immediately available    Physician(s) Rosabel Mose, NP    Location [facility]    Staff Present Ronal Levin, RN, BSN;Tiare Rohlman Claudene, Neita Moats, MS, ACSM-CEP, Exercise Physiologist;Randi Midge BS, ACSM-CEP, Exercise Physiologist    Virtual Visit No    Medication changes reported     No    Fall or balance concerns reported    No    Tobacco Cessation No Change    Warm-up and Cool-down Performed as group-led instruction    Resistance Training Performed Yes    VAD Patient? No    PAD/SET Patient? No      Pain Assessment   Currently in Pain? No/denies    Multiple Pain Sites No          Capillary Blood Glucose: No results found for this or any previous visit (from the past 24 hours).    Social History   Tobacco Use  Smoking Status Former   Current packs/day: 0.00   Average packs/day: 0.3 packs/day for 10.0 years (3.3 ttl pk-yrs)   Types: Cigarettes   Start date: 10/03/2001   Quit date: 10/04/2011   Years since quitting: 12.2  Smokeless Tobacco Never  Tobacco Comments   No longer smoking cigarettes    Goals Met:  Proper associated with RPD/PD & O2 Sat Independence with exercise equipment Exercise tolerated well No report of concerns or symptoms today Strength training completed today  Goals Unmet:  Not Applicable  Comments: Service time is from 1030 to 1139.    Dr. Slater Staff is Medical Director for Pulmonary Rehab at [facility].

## 2024-01-14 ENCOUNTER — Other Ambulatory Visit: Payer: Self-pay | Admitting: Licensed Clinical Social Worker

## 2024-01-18 ENCOUNTER — Encounter (HOSPITAL_COMMUNITY)
Admission: RE | Admit: 2024-01-18 | Discharge: 2024-01-18 | Disposition: A | Discharge status: Home / Self Care | Source: Ambulatory Visit | Attending: Pulmonary Disease | Admitting: Pulmonary Disease

## 2024-01-18 DIAGNOSIS — I2721 Secondary pulmonary arterial hypertension: Secondary | ICD-10-CM

## 2024-01-18 NOTE — Progress Notes (Signed)
 Daily Session Note  Patient Details  Name: [name] MRN: [id number] Date of Birth: [birth year] Referring Provider:   Conrad Ports Pulmonary Rehab Walk Test [facility]/03/20[facility] in [facility]  Referring Provider [place]    Encounter Date: 01/18/2024  Check In:  Session Check In - 01/18/24 1158       Check-In   Supervising physician immediately available to respond to emergencies [facility] MD immediately available    Physician(s) Rosabel Mose, NP    Location [facility]    Staff Present Ronal Levin, RN, BSN;Casey Claudene, RT;Mickala Laton [place] BS, ACSM-CEP, Exercise Physiologist;Olinty Delacroix, MS, ACSM-CEP, Exercise Physiologist    Virtual Visit No    Medication changes reported     No    Fall or balance concerns reported    No    Tobacco Cessation No Change    Warm-up and Cool-down Performed as group-led instruction    Resistance Training Performed Yes    VAD Patient? No      Pain Assessment   Currently in Pain? No/denies          Capillary Blood Glucose: No results found for this or any previous visit (from the past 24 hours).    Social History   Tobacco Use  Smoking Status Former   Current packs/day: 0.00   Average packs/day: 0.3 packs/day for 10.0 years (3.3 ttl pk-yrs)   Types: Cigarettes   Start date: 10/03/2001   Quit date: 10/04/2011   Years since quitting: 12.2  Smokeless Tobacco Never  Tobacco Comments   No longer smoking cigarettes    Goals Met:  Independence with exercise equipment Exercise tolerated well No report of concerns or symptoms today Strength training completed today  Goals Unmet:  Not Applicable  Comments: Service time is from 1016 to 1134.    Dr. Slater Staff is Medical Director for Pulmonary Rehab at [facility].

## 2024-01-19 NOTE — Patient Instructions (Signed)
 Visit Information  Thank you for taking time to visit with me today. Please don't hesitate to contact me if I can be of assistance to you before our next scheduled appointment.  Your next care management appointment is by telephone on 12/5 at 10:30 AM  Please call the care guide team at [phone] if you need to cancel, schedule, or reschedule an appointment.   Please call the Suicide and Crisis Lifeline: 988 go to [facility] [address], [place] [phone]) call 911 if you are experiencing a Mental Health or Behavioral Health Crisis or need someone to talk to.  Rolin Kerns, LCSW [place]  [facility], [facility] Clinical Social Worker Direct Dial: [phone]  Fax: [phone] Website: [email or web address] 5:13 AM

## 2024-01-19 NOTE — Patient Outreach (Signed)
 Complex Care Management   Visit Note  01/14/2024  Name:  [name] MRN: [id number] DOB: [birth year]  Situation: Referral received for Complex Care Management related to Caregiver Stress I obtained verbal consent from Patient.  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   Menorrhagia    secondary to uterine fibroids   OSA (obstructive sleep apnea)    07/25/13 HST AHI 33/hr, severe hypoxemia O2 min 42% and 95% of the time <89%   PE (pulmonary embolism) 2014   bilateral   Prediabetes    Pulmonary artery hypertension (HCC)    Pulmonary nodule    (5mm on loeft lower lobe) found on CT scan July 2014, repeat scan Jan 2015 showed less than 4mm   Right ovarian cyst    noted 09/2012    S/P TAH (total abdominal hysterectomy) 06/07/2013   Sleep apnea    SOB (shortness of breath) on exertion    Stomach ulcer    Trichomoniasis    05/2011    Vitamin D  deficiency     Assessment: Patient Reported Symptoms:  Cognitive Cognitive Status: No symptoms reported, Alert and oriented to person, place, and time, Normal speech and language skills Cognitive/Intellectual Conditions Management [RPT]: None reported or documented in medical history or problem list      Neurological Neurological Review of Symptoms: No symptoms reported    HEENT HEENT Symptoms Reported: Not assessed      Cardiovascular Cardiovascular Symptoms Reported: Not assessed    Respiratory Respiratory Symptoms Reported: Not assesed    Endocrine Endocrine  Symptoms Reported: Not assessed    Gastrointestinal Gastrointestinal Symptoms Reported: Not assessed      Genitourinary Genitourinary Symptoms Reported: Not assessed    Integumentary Integumentary Symptoms Reported: Not assessed    Musculoskeletal Musculoskelatal Symptoms Reviewed: Not assessed        Psychosocial Psychosocial Symptoms Reported: Other Other Psychosocial Conditions: Caregiver Stress Additional Psychological Details: Pt reports father was currently hospitalized. Despite this, she is still utilizing healthy coping skills and plans to r/s f/up appt with counselor Behavioral Management Strategies: Coping strategies, Counseling, Support system Major Change/Loss/Stressor/Fears (CP): Medical condition, family, Medical condition, self Techniques to Cope with Loss/Stress/Change: Diversional activities, Counseling, Medication      01/19/2024    PHQ2-9 Depression Screening   Little interest or pleasure in doing things    Feeling down, depressed, or hopeless    PHQ-2 - Total Score    Trouble falling or staying asleep, or sleeping too much    Feeling tired or having little energy    Poor appetite or overeating     Feeling bad about yourself - or that you are a failure or have let yourself or your family down    Trouble concentrating on things, such as reading the newspaper or watching television    Moving or speaking so slowly that other people could have noticed.  Or the opposite -  being so fidgety or restless that you have been moving around a lot more than usual    Thoughts that you would be better off dead, or hurting yourself in some way    PHQ2-9 Total Score    If you checked off any problems, how difficult have these problems made it for you to do your work, take care of things at home, or get along with other people    Depression Interventions/Treatment      There were no vitals filed for this visit.  Medications Reviewed Today     Reviewed by Ezzard Rolin BIRCH,  LCSW (Social Worker) on 01/19/24 at [phone]  Med List Status: <None>   Medication Order Taking? Sig Documenting Provider Last Dose Status Informant  acetaminophen  (TYLENOL ) 500 MG tablet [id number]  Take 1,000 mg by mouth every 6 (six) hours as needed for moderate pain or headache.  [provider]  Active Self  albuterol  (ACCUNEB ) 1.25 MG/3ML nebulizer solution [id number]  Take 3 mLs (1.25 mg total) by nebulization every 6 (six) hours as needed for wheezing. Georgina Speaks, FNP  Active   albuterol  (PROAIR  HFA) 108 (90 Base) MCG/ACT inhaler [id number]  Inhale 1-2 puffs into the lungs every 6 (six) hours as needed for wheezing or shortness of breath. McLean-Scocuzza, Randine SAILOR, MD  Active   Azelastine  HCl 137 MCG/SPRAY SOLN [id number]  PLACE 1 SPRAY INTO BOTH NOSTRILS 2 (TWO) TIMES DAILY. USE IN EACH NOSTRIL AS DIRECTED Webb, Padonda B, FNP  Active   chlorpheniramine-HYDROcodone  (TUSSIONEX) 10-8 MG/5ML [id number]  Take 5 mLs by mouth every 12 (twelve) hours as needed for cough. Georgina Speaks, FNP  Active   clotrimazole -betamethasone  (LOTRISONE ) cream [id number]  Apply 1 application topically 2 (two) times daily. Prn leg dermatitis McLean-Scocuzza, Randine SAILOR, MD  Active   Colchicine  (MITIGARE ) 0.6 MG CAPS [id number]  Take 1 capsule by mouth as needed. As needed gout flare max # of pills 2 in 24 hours McLean-Scocuzza, Randine SAILOR, MD  Active   fluconazole  (DIFLUCAN ) 150 MG tablet [id number]  Take 1 tablet (150 mg total) by mouth once a week.   Active   fluticasone  (FLONASE ) 50 MCG/ACT nasal spray [id number]  Place into both nostrils daily as needed for allergies or rhinitis. [provider]  Active Self  Fluticasone -Salmeterol (ADVAIR  HFA IN) [id number]  Inhale 1 puff into the lungs as needed. [provider]  Active   fluticasone -salmeterol (ADVAIR ) 250-50 MCG/ACT AEPB [id number]  Inhale 1 puff into the lungs 2 (two) times daily, in the morning and bedtime   Active   furosemide  (LASIX ) 40 MG tablet  [id number]  Take 1 tablet (40 mg total) by mouth daily.   Active   heparin  lock flush 100 unit/mL [id number]   Gudena, Vinay, MD  Active   hydrOXYzine (ATARAX) 25 MG tablet [id number]  Take 1 tablet (25 mg total) by mouth 3 (three) times daily as needed.   Active   ibuprofen  (ADVIL ) 800 MG tablet [id number]  Take 1 tablet (800 mg total) by mouth every 8 (eight) hours as needed. McLean-Scocuzza, Randine SAILOR, MD  Active   linaclotide  (LINZESS ) 145 MCG CAPS capsule [id number]  Take 1 capsule (145 mcg total) by mouth daily.   Active   linaclotide  (LINZESS ) 72 MCG capsule [id number]  Take 1 capsule (72 mcg total) by mouth daily before breakfast. Georgina Speaks, FNP  Active   loratadine  (CLARITIN ) 10 MG tablet [id number]  Take 1 tablet (10 mg total) by mouth daily. McLean-Scocuzza, Randine SAILOR, MD  Active   Magnesium  Cl-Calcium  Carbonate (SLOW MAGNESIUM /CALCIUM ) 70-117 MG TBEC [id number]  Take 2 tablets by mouth in the morning and at bedtime. Gudena, Vinay, MD  Active Self  Magnesium  Oxide -Mg Supplement 200 MG CHEW [id number]  Chew 1 gummy by mouth daily. Lee, [place], NP  Active   metoprolol  tartrate (LOPRESSOR ) 25 MG tablet [id number]  TAKE 1 TABLET BY MOUTH TWICE A DAY McLean-Scocuzza, Randine SAILOR, MD  Active   miconazole  (MONISTAT  7) 2 % vaginal cream [id number]  Place 1 Applicatorful vaginally at bedtime.   Active   Multiple Vitamin (MULTIVITAMIN) tablet [id number]  Take 1 tablet by mouth daily. [provider]  Active Self  omeprazole  (PRILOSEC) 40 MG capsule [id number]  Take 40 mg by mouth as needed. [provider]  Active   OPSUMIT  10 MG TABS [id number]  Take 10 mg by mouth daily. [provider]  Active Self  ORENITRAM  0.25 MG TBCR [id number]  Take 0.5 mg by mouth.  Take 2 tablets (0.5 mg total) by mouth every eight (8) hours. Take along with 5 mg tablet and four 1 mg tablets, total 8.5 mg every 8 hours [provider]  Active   ORENITRAM  1 MG TBCR [id number]  Take by mouth. [provider]  Active   ORENITRAM  5 MG TBCR [id number]  Take by mouth. [provider]  Active   Polyethyl Glycol-Propyl Glycol (SYSTANE OP) [id number]  Place 1 drop into both eyes 3 (three) times daily as needed (dry/irritated eyes.). [provider]  Active Self  Potassium Chloride  ER 20 MEQ TBCR [id number]  Take 1 tablet by mouth as needed. [provider]  Active   predniSONE  (DELTASONE ) 20 MG tablet [id number]  Take 1 tablet (20 mg total) by mouth daily.   Active     Discontinued 08/17/16 0953   rivaroxaban  (XARELTO ) 20 MG TABS tablet [id number]  Take 20 mg by mouth daily with supper. [provider]  Active Self  sildenafil  (REVATIO ) 20 MG tablet [id number]  Take 1-2 tablets (20-40 mg total) by mouth 3 (three) times daily. As of 07/03/20 taking 20 mg tid per [facility] Dr. LEvarge McLean-Scocuzza, Randine SAILOR, MD  Active   sodium chloride  flush (NS) 0.9 % injection 10 mL [id number]   Odean Potts, MD  Active   spironolactone  (ALDACTONE ) 50 MG tablet [id number]  Take 1 tablet (50 mg total) by mouth daily. McLean-Scocuzza, Randine SAILOR, MD  Active   telmisartan  (MICARDIS ) 20 MG tablet [id number]  Take 1 tablet (20 mg total) by mouth daily. Vanderbilt, [place], [place]  Active   tirzepatide  (MOUNJARO ) 7.5 MG/0.5ML Pen [id number]  Inject 7.5 mg into the skin once a week. Rayburn, Elouise Phlegm, PA-C  Active   torsemide  (DEMADEX ) 20 MG tablet [id number]  Take 20 mg by mouth daily as needed. [provider]  Active   triamcinolone  cream (KENALOG ) 0.1 % [id number]  Apply 1 application topically daily as needed (leg discoloration). McLean-Scocuzza, Randine SAILOR, MD  Active Self  Vitamin D , Ergocalciferol , (DRISDOL ) 1.25 MG (50000 UNIT) CAPS capsule [id number]  Take 1 capsule (50,000 Units total) by mouth every 7 (seven) days. *Need office visit for refills* Rayburn, Elouise Phlegm, PA-C  Active   WINREVAIR 2 x 45 MG subcutaneous injection [id number]  Inject into the skin. [provider]  Active             Recommendation:   Continue Current Plan of Care  Follow Up Plan:   Telephone follow-up in 1  month  Rolin Kerns, LCSW [facility]  Gi Or Norman, [facility] Clinical Social Worker Direct Dial: [phone]  Fax: [phone] Website: [email or web address] 5:13 AM

## 2024-01-20 ENCOUNTER — Encounter (HOSPITAL_COMMUNITY)
Admission: RE | Admit: 2024-01-20 | Discharge: 2024-01-20 | Disposition: A | Discharge status: Home / Self Care | Source: Ambulatory Visit | Attending: Pulmonary Disease | Admitting: Pulmonary Disease

## 2024-01-20 VITALS — Wt 256.2 lb

## 2024-01-20 DIAGNOSIS — I2721 Secondary pulmonary arterial hypertension: Secondary | ICD-10-CM

## 2024-01-20 NOTE — Progress Notes (Signed)
 Daily Session Note  Patient Details  Name: [name] MRN: [id number] Date of Birth: 07-20-1965 Referring Provider:   Conrad Ports Pulmonary Rehab Walk Test from 11/03/2023 in [facility]  Referring Provider [place]    Encounter Date: 01/20/2024  Check In:  Session Check In - 01/20/24 1133       Check-In   Supervising physician immediately available to respond to emergencies [facility] MD immediately available    Physician(s) Damien Braver, NP    Location [facility]    Staff Present Ronal Levin, RN, BSN;Gerron Guidotti Claudene, RT;Randi [facility] BS, ACSM-CEP, Exercise Physiologist;Kaylee Nicholaus, MS, ACSM-CEP, Exercise Physiologist    Virtual Visit No    Medication changes reported     No    Fall or balance concerns reported    No    Tobacco Cessation No Change    Warm-up and Cool-down Performed as group-led instruction    Resistance Training Performed Yes    VAD Patient? No    PAD/SET Patient? No      Pain Assessment   Currently in Pain? No/denies    Multiple Pain Sites No          Capillary Blood Glucose: No results found for this or any previous visit (from the past 24 hours).    Social History   Tobacco Use  Smoking Status Former   Current packs/day: 0.00   Average packs/day: 0.3 packs/day for 10.0 years (3.3 ttl pk-yrs)   Types: Cigarettes   Start date: 10/03/2001   Quit date: 10/04/2011   Years since quitting: 12.3  Smokeless Tobacco Never  Tobacco Comments   No longer smoking cigarettes    Goals Met:  Proper associated with RPD/PD & O2 Sat Independence with exercise equipment Exercise tolerated well No report of concerns or symptoms today Strength training completed today  Goals Unmet:  Not Applicable  Comments: Service time is from 1015 to 1140.    Dr. Slater Staff is Medical Director for Pulmonary Rehab at [facility].

## 2024-01-25 ENCOUNTER — Telehealth (HOSPITAL_COMMUNITY): Payer: Self-pay

## 2024-01-25 ENCOUNTER — Encounter (HOSPITAL_COMMUNITY): Admission: RE | Admit: 2024-01-25 | Source: Ambulatory Visit

## 2024-01-25 NOTE — Telephone Encounter (Signed)
 Patient c/o for 10:15 PR class, no reason given.

## 2024-01-26 ENCOUNTER — Other Ambulatory Visit (INDEPENDENT_AMBULATORY_CARE_PROVIDER_SITE_OTHER): Payer: Self-pay | Admitting: Physician Assistant

## 2024-01-26 ENCOUNTER — Encounter (HOSPITAL_COMMUNITY): Payer: Self-pay

## 2024-01-26 ENCOUNTER — Other Ambulatory Visit (HOSPITAL_COMMUNITY): Payer: Self-pay

## 2024-01-26 DIAGNOSIS — E1129 Type 2 diabetes mellitus with other diabetic kidney complication: Secondary | ICD-10-CM

## 2024-01-26 NOTE — Progress Notes (Signed)
 Pulmonary Individual Treatment Plan  Patient Details  Name: [name] MRN: [id number] Date of Birth: February 17, 1966 Referring Provider:   Conrad Ports Pulmonary Rehab Walk Test from 11/03/2023 in [facility]  Referring Provider [place]    Initial Encounter Date:  Flowsheet Row Pulmonary Rehab Walk Test from 11/03/2023 in [facility]  Date 11/03/23    Visit Diagnosis: PAH (pulmonary artery hypertension) (HCC)  Patient's Home Medications on Admission:   Current Outpatient Medications:    acetaminophen  (TYLENOL ) 500 MG tablet, Take 1,000 mg by mouth every 6 (six) hours as needed for moderate pain or headache. , Disp: , Rfl:    albuterol  (ACCUNEB ) 1.25 MG/3ML nebulizer solution, Take 3 mLs (1.25 mg total) by nebulization every 6 (six) hours as needed for wheezing., Disp: 360 mL, Rfl: 12   albuterol  (PROAIR  HFA) 108 (90 Base) MCG/ACT inhaler, Inhale 1-2 puffs into the lungs every 6 (six) hours as needed for wheezing or shortness of breath., Disp: 18 g, Rfl: 11   Azelastine  HCl 137 MCG/SPRAY SOLN, PLACE 1 SPRAY INTO BOTH NOSTRILS 2 (TWO) TIMES DAILY. USE IN EACH NOSTRIL AS DIRECTED, Disp: 30 mL, Rfl: 4   chlorpheniramine-HYDROcodone  (TUSSIONEX) 10-8 MG/5ML, Take 5 mLs by mouth every 12 (twelve) hours as needed for cough., Disp: 115 mL, Rfl: 0   clotrimazole -betamethasone  (LOTRISONE ) cream, Apply 1 application topically 2 (two) times daily. Prn leg dermatitis, Disp: 45 g, Rfl: 0   Colchicine  (MITIGARE ) 0.6 MG CAPS, Take 1 capsule by mouth as needed. As needed gout flare max # of pills 2 in 24 hours, Disp: 30 capsule, Rfl: 11   fluconazole  (DIFLUCAN ) 150 MG tablet, Take 1 tablet (150 mg total) by mouth once a week., Disp: 2 tablet, Rfl: 0   fluticasone  (FLONASE ) 50 MCG/ACT nasal spray, Place into both nostrils daily as needed for allergies or rhinitis., Disp: , Rfl:     Fluticasone -Salmeterol (ADVAIR  HFA IN), Inhale 1 puff into the lungs as needed., Disp: , Rfl:    fluticasone -salmeterol (ADVAIR ) 250-50 MCG/ACT AEPB, Inhale 1 puff into the lungs 2 (two) times daily, in the morning and bedtime, Disp: 60 each, Rfl: 11   furosemide  (LASIX ) 40 MG tablet, Take 1 tablet (40 mg total) by mouth daily., Disp: 30 tablet, Rfl: 11   hydrOXYzine (ATARAX) 25 MG tablet, Take 1 tablet (25 mg total) by mouth 3 (three) times daily as needed., Disp: 90 tablet, Rfl: 1   ibuprofen  (ADVIL ) 800 MG tablet, Take 1 tablet (800 mg total) by mouth every 8 (eight) hours as needed., Disp: 90 tablet, Rfl: 1   linaclotide  (LINZESS ) 145 MCG CAPS capsule, Take 1 capsule (145 mcg total) by mouth daily., Disp: 30 capsule, Rfl: 3   linaclotide  (LINZESS ) 72 MCG capsule, Take 1 capsule (72 mcg total) by mouth daily before breakfast., Disp: 90 capsule, Rfl: 1   loratadine  (CLARITIN ) 10 MG tablet, Take 1 tablet (10 mg total) by mouth daily., Disp: 90 tablet, Rfl: 3   Magnesium  Cl-Calcium  Carbonate (SLOW MAGNESIUM /CALCIUM ) 70-117 MG TBEC, Take 2 tablets by mouth in the morning and at bedtime., Disp: , Rfl:    Magnesium  Oxide -Mg Supplement 200 MG CHEW, Chew 1 gummy by mouth daily., Disp: 30 tablet, Rfl: 3   metoprolol  tartrate (LOPRESSOR ) 25 MG tablet, TAKE 1 TABLET BY MOUTH TWICE A DAY, Disp: 180 tablet, Rfl: 3   miconazole  (MONISTAT  7) 2 % vaginal cream, Place 1  Applicatorful vaginally at bedtime., Disp: 45 g, Rfl: 0   Multiple Vitamin (MULTIVITAMIN) tablet, Take 1 tablet by mouth daily., Disp: , Rfl:    omeprazole  (PRILOSEC) 40 MG capsule, Take 40 mg by mouth as needed., Disp: , Rfl:    OPSUMIT  10 MG TABS, Take 10 mg by mouth daily., Disp: , Rfl: 8   ORENITRAM  0.25 MG TBCR, Take 0.5 mg by mouth.  Take 2 tablets (0.5 mg total) by mouth every eight (8) hours. Take along with 5 mg tablet and four 1 mg tablets, total 8.5 mg every 8 hours, Disp: , Rfl:    ORENITRAM  1 MG TBCR, Take by mouth., Disp: , Rfl:     ORENITRAM  5 MG TBCR, Take by mouth., Disp: , Rfl:    Polyethyl Glycol-Propyl Glycol (SYSTANE OP), Place 1 drop into both eyes 3 (three) times daily as needed (dry/irritated eyes.)., Disp: , Rfl:    Potassium Chloride  ER 20 MEQ TBCR, Take 1 tablet by mouth as needed., Disp: , Rfl:    predniSONE  (DELTASONE ) 20 MG tablet, Take 1 tablet (20 mg total) by mouth daily., Disp: 10 tablet, Rfl: 0   rivaroxaban  (XARELTO ) 20 MG TABS tablet, Take 20 mg by mouth daily with supper., Disp: , Rfl:    sildenafil  (REVATIO ) 20 MG tablet, Take 1-2 tablets (20-40 mg total) by mouth 3 (three) times daily. As of 07/03/20 taking 20 mg tid per [facility] pulm Dr. Janie, Disp: 10 tablet, Rfl: 0   spironolactone  (ALDACTONE ) 50 MG tablet, Take 1 tablet (50 mg total) by mouth daily., Disp: 90 tablet, Rfl: 3   telmisartan  (MICARDIS ) 20 MG tablet, Take 1 tablet (20 mg total) by mouth daily., Disp: 30 tablet, Rfl: 11   tirzepatide  (MOUNJARO ) 7.5 MG/0.5ML Pen, Inject 7.5 mg into the skin once a week., Disp: 2 mL, Rfl: 0   torsemide  (DEMADEX ) 20 MG tablet, Take 20 mg by mouth daily as needed., Disp: , Rfl:    triamcinolone  cream (KENALOG ) 0.1 %, Apply 1 application topically daily as needed (leg discoloration)., Disp: 454 g, Rfl: 2   Vitamin D , Ergocalciferol , (DRISDOL ) 1.25 MG (50000 UNIT) CAPS capsule, Take 1 capsule (50,000 Units total) by mouth every 7 (seven) days. *Need office visit for refills*, Disp: 5 capsule, Rfl: 0   WINREVAIR 2 x 45 MG subcutaneous injection, Inject into the skin., Disp: , Rfl:  No current facility-administered medications for this encounter.  Facility-Administered Medications Ordered in Other Encounters:    heparin  lock flush 100 unit/mL, 500 Units, Intracatheter, Once PRN, Gudena, Vinay, MD   sodium chloride  flush (NS) 0.9 % injection 10 mL, 10 mL, Intracatheter, PRN, Gudena, Vinay, MD  Past Medical History: Past Medical History:  Diagnosis Date   Anemia    iron deficiency   Arthritis    Borderline  diabetes    Complication of anesthesia    woke up slowly- after hysterectomy- 2015   COVID-19    Diabetes mellitus, type II (HCC)    DVT (deep venous thrombosis) (HCC) 2014   left leg   Family history of breast cancer    Gout    Heart failure (HCC)    HTN (hypertension)    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   Menorrhagia    secondary [name] uterine fibroids   OSA (obstructive sleep apnea)    07/25/13 HST AHI 33/hr, severe hypoxemia O2 min 42% and 95% of the time <89%   PE (pulmonary embolism) 2014   bilateral  Prediabetes    Pulmonary artery hypertension (HCC)    Pulmonary nodule    (5mm on loeft lower lobe) found on CT scan July 2014, repeat scan Jan 2015 showed less than 4mm   Right ovarian cyst    noted 09/2012    S/P TAH (total abdominal hysterectomy) 06/07/2013   Sleep apnea    SOB (shortness of breath) on exertion    Stomach ulcer    Trichomoniasis    05/2011    Vitamin D  deficiency     Tobacco Use: Social History   Tobacco Use  Smoking Status Former   Current packs/day: 0.00   Average packs/day: 0.3 packs/day for 10.0 years (3.3 ttl pk-yrs)   Types: Cigarettes   Start date: 10/03/2001   Quit date: 10/04/2011   Years since quitting: 12.3  Smokeless Tobacco Never  Tobacco Comments   No longer smoking cigarettes    Labs: Review Flowsheet  More data exists      Latest Ref Rng & Units 04/30/2022 09/09/2022 11/12/2022 04/13/2023 10/19/2023  Labs for ITP Cardiac and Pulmonary Rehab  Cholestrol 100 - 199 mg/dL 819  808  - 778  751   LDL (calc) 0 - 99 mg/dL 889  879  - 857  835   HDL-C >39 mg/dL 43  49  - 56  43   Trlycerides 0 - 149 mg/dL 848  878  - 868  779   Hemoglobin A1c 4.8 - 5.6 % 5.3  5.1  5.2  5.1  5.3     Capillary Blood Glucose: Lab Results  Component Value Date   GLUCAP 119 (H) 09/09/2017   GLUCAP 113 (H) 09/09/2017   GLUCAP 95 09/02/2017   GLUCAP 108 (H) 07/27/2016   GLUCAP 116 (H) 07/27/2016      Pulmonary Assessment Scores:  Pulmonary Assessment Scores     Row Name 11/03/23 1149         ADL UCSD   ADL Phase Entry     SOB Score total 56       CAT Score   CAT Score 21       mMRC Score   mMRC Score 4       UCSD: Self-administered rating of dyspnea associated with activities of daily living (ADLs) 6-point scale (0 = not at all [name] 5 = maximal or unable to do because of breathlessness)  Scoring Scores range from 0 [name] 120.  Minimally important difference is 5 units  CAT: CAT can identify the health impairment of COPD patients and is better correlated with disease progression.  CAT has a scoring range of zero [name] 40. The CAT score is classified into four groups of low (less than 10), medium (10 - 20), high (21-30) and very high (31-40) based on the impact level of disease on health status. A CAT score over 10 suggests significant symptoms.  A worsening CAT score could be explained by an exacerbation, poor medication adherence, poor inhaler technique, or progression of COPD or comorbid conditions.  CAT MCID is 2 points  mMRC: mMRC (Modified Medical Research Council) Dyspnea Scale is used [name] assess the degree of baseline functional disability in patients of respiratory disease due [name] dyspnea. No minimal important difference is established. A decrease in score of 1 point or greater is considered a positive change.   Pulmonary Function Assessment:  Pulmonary Function Assessment - 11/03/23 1122       Breath   Bilateral Breath Sounds Clear;Decreased    Shortness of Breath Yes;Limiting  activity          Exercise Target Goals: Exercise Program Goal: Individual exercise prescription set using results from initial 6 min walk test and THRR while considering  patient's activity barriers and safety.   Exercise Prescription Goal: Initial exercise prescription builds [name] 30-45 minutes a day of aerobic activity, 2-3 days per week.  Home exercise guidelines will be given [name]  patient during program as part of exercise prescription that the participant will acknowledge.  Activity Barriers & Risk Stratification:  Activity Barriers & Cardiac Risk Stratification - 11/03/23 1108       Activity Barriers & Cardiac Risk Stratification   Activity Barriers Shortness of Breath;Muscular Weakness;Deconditioning    Cardiac Risk Stratification Low          6 Minute Walk:  6 Minute Walk     Row Name 11/11/23 1205         6 Minute Walk   Phase Initial     Distance 1020 feet     Walk Time 6 minutes     # of Rest Breaks 0     MPH 1.93     METS 2.6     RPE 13     Perceived Dyspnea  1     VO2 Peak 9.1     Symptoms No     Resting HR 79 bpm     Resting BP 150/90     Resting Oxygen  Saturation  94 %     Exercise Oxygen  Saturation  during 6 min walk 87 %     Max Ex. HR 117 bpm     Max Ex. BP 142/80     2 Minute Post BP 140/80       Interval HR   1 Minute HR 104     2 Minute HR 103     3 Minute HR 103     4 Minute HR 117     5 Minute HR 114     6 Minute HR 114     2 Minute Post HR 83     Interval Heart Rate? Yes       Interval Oxygen    Interval Oxygen ? Yes     Baseline Oxygen  Saturation % 94 %     1 Minute Oxygen  Saturation % 91 %     1 Minute Liters of Oxygen  0 L     2 Minute Oxygen  Saturation % 90 %  87% at 1:26     2 Minute Liters of Oxygen  0 L  increased [name] 2L     3 Minute Oxygen  Saturation % 89 %     3 Minute Liters of Oxygen  2 L  increased [name] 4L     4 Minute Oxygen  Saturation % 90 %     4 Minute Liters of Oxygen  4 L     5 Minute Oxygen  Saturation % 91 %     5 Minute Liters of Oxygen  4 L     6 Minute Oxygen  Saturation % 90 %     6 Minute Liters of Oxygen  4 L     2 Minute Post Oxygen  Saturation % 94 %     2 Minute Post Liters of Oxygen  4 L        Oxygen  Initial Assessment:  Oxygen  Initial Assessment - 11/11/23 1208       Initial 6 min Walk   Oxygen  Used Continuous    Liters per minute 4  Oxygen  Re-Evaluation:  Oxygen   Re-Evaluation     Row Name 11/11/23 1208 11/23/23 1637 12/17/23 1101 01/20/24 0829       Program Oxygen  Prescription   Program Oxygen  Prescription Continuous Continuous Continuous Continuous    Liters per minute 4 4 4 4       Home Oxygen    Home Oxygen  Device Home Concentrator  d tanks with pulse regulator Home Concentrator  d tanks with pulse regulator Home Concentrator  d tanks with pulse regulator Home Concentrator  d tanks with pulse regulator    Sleep Oxygen  Prescription BiPAP BiPAP BiPAP BiPAP    Liters per minute 6 6 6 6     Home Exercise Oxygen  Prescription Pulsed  d tank with pulse regulator Pulsed  d tank with pulse regulator Pulsed  d tank with pulse regulator Pulsed  d tank with pulse regulator    Liters per minute 4 4 4 4     Home Resting Oxygen  Prescription Continuous Continuous Continuous Continuous    Liters per minute 2 2 2 2     Compliance with Home Oxygen  Use Yes Yes Yes Yes      Goals/Expected Outcomes   Short Term Goals [name] learn and exhibit compliance with exercise, home and travel O2 prescription;[name] learn and understand importance of monitoring SPO2 with pulse oximeter and demonstrate accurate use of the pulse oximeter.;[name] learn and understand importance of maintaining oxygen  saturations>88%;[name] learn and demonstrate proper pursed lip breathing techniques or other breathing techniques. ;[name] learn and demonstrate proper use of respiratory medications [name] learn and exhibit compliance with exercise, home and travel O2 prescription;[name] learn and understand importance of monitoring SPO2 with pulse oximeter and demonstrate accurate use of the pulse oximeter.;[name] learn and understand importance of maintaining oxygen  saturations>88%;[name] learn and demonstrate proper pursed lip breathing techniques or other breathing techniques. ;[name] learn and demonstrate proper use of respiratory medications [name] learn and exhibit compliance with exercise, home and travel O2 prescription;[name] learn and understand  importance of monitoring SPO2 with pulse oximeter and demonstrate accurate use of the pulse oximeter.;[name] learn and understand importance of maintaining oxygen  saturations>88%;[name] learn and demonstrate proper pursed lip breathing techniques or other breathing techniques. ;[name] learn and demonstrate proper use of respiratory medications [name] learn and exhibit compliance with exercise, home and travel O2 prescription;[name] learn and understand importance of monitoring SPO2 with pulse oximeter and demonstrate accurate use of the pulse oximeter.;[name] learn and understand importance of maintaining oxygen  saturations>88%;[name] learn and demonstrate proper pursed lip breathing techniques or other breathing techniques. ;[name] learn and demonstrate proper use of respiratory medications    Long  Term Goals Exhibits compliance with exercise, home  and travel O2 prescription;Maintenance of O2 saturations>88%;Compliance with respiratory medication;Verbalizes importance of monitoring SPO2 with pulse oximeter and return demonstration;Exhibits proper breathing techniques, such as pursed lip breathing or other method taught during program session;Demonstrates proper use of MDI's Exhibits compliance with exercise, home  and travel O2 prescription;Maintenance of O2 saturations>88%;Compliance with respiratory medication;Verbalizes importance of monitoring SPO2 with pulse oximeter and return demonstration;Exhibits proper breathing techniques, such as pursed lip breathing or other method taught during program session;Demonstrates proper use of MDI's Exhibits compliance with exercise, home  and travel O2 prescription;Maintenance of O2 saturations>88%;Compliance with respiratory medication;Verbalizes importance of monitoring SPO2 with pulse oximeter and return demonstration;Exhibits proper breathing techniques, such as pursed lip breathing or other method taught during program session;Demonstrates proper use of MDI's Exhibits compliance with exercise,  home  and travel O2 prescription;Maintenance of O2 saturations>88%;Compliance with respiratory medication;Verbalizes importance of  monitoring SPO2 with pulse oximeter and return demonstration;Exhibits proper breathing techniques, such as pursed lip breathing or other method taught during program session;Demonstrates proper use of MDI's    Goals/Expected Outcomes Compliance and understanding of oxygen  saturation monitoring and breathing techniques [name] decrease shortness of breath. Compliance and understanding of oxygen  saturation monitoring and breathing techniques [name] decrease shortness of breath. Compliance and understanding of oxygen  saturation monitoring and breathing techniques [name] decrease shortness of breath. Compliance and understanding of oxygen  saturation monitoring and breathing techniques [name] decrease shortness of breath.       Oxygen  Discharge (Final Oxygen  Re-Evaluation):  Oxygen  Re-Evaluation - 01/20/24 0829       Program Oxygen  Prescription   Program Oxygen  Prescription Continuous    Liters per minute 4      Home Oxygen    Home Oxygen  Device Home Concentrator   d tanks with pulse regulator   Sleep Oxygen  Prescription BiPAP    Liters per minute 6    Home Exercise Oxygen  Prescription Pulsed   d tank with pulse regulator   Liters per minute 4    Home Resting Oxygen  Prescription Continuous    Liters per minute 2    Compliance with Home Oxygen  Use Yes      Goals/Expected Outcomes   Short Term Goals [name] learn and exhibit compliance with exercise, home and travel O2 prescription;[name] learn and understand importance of monitoring SPO2 with pulse oximeter and demonstrate accurate use of the pulse oximeter.;[name] learn and understand importance of maintaining oxygen  saturations>88%;[name] learn and demonstrate proper pursed lip breathing techniques or other breathing techniques. ;[name] learn and demonstrate proper use of respiratory medications    Long  Term Goals Exhibits compliance with exercise, home   and travel O2 prescription;Maintenance of O2 saturations>88%;Compliance with respiratory medication;Verbalizes importance of monitoring SPO2 with pulse oximeter and return demonstration;Exhibits proper breathing techniques, such as pursed lip breathing or other method taught during program session;Demonstrates proper use of MDI's    Goals/Expected Outcomes Compliance and understanding of oxygen  saturation monitoring and breathing techniques [name] decrease shortness of breath.          Initial Exercise Prescription:  Initial Exercise Prescription - 11/03/23 1200       Date of Initial Exercise RX and Referring Provider   Date 11/03/23    Referring Provider Levarge    Expected Discharge Date 02/01/24      Oxygen    Oxygen  Continuous    Liters 4    Maintain Oxygen  Saturation 88% or higher      NuStep   Level 2    SPM 80    Minutes 15    METs 1.5      Track   Minutes 15    METs 2.3      Prescription Details   Frequency (times per week) 2    Duration Progress [name] 30 minutes of continuous aerobic without signs/symptoms of physical distress      Intensity   THRR 40-80% of Max Heartrate 65-130    Ratings of Perceived Exertion 11-13    Perceived Dyspnea 0-4      Progression   Progression Continue [name] progress workloads [name] maintain intensity without signs/symptoms of physical distress.      Resistance Training   Training Prescription Yes    Weight Red bands    Reps 10-15          Perform Capillary Blood Glucose checks as needed.  Exercise Prescription Changes:   Exercise Prescription Changes     Row  Name 11/16/23 1200 11/30/23 1200 12/14/23 1200 12/23/23 1217 01/06/24 0935     Response [name] Exercise   Blood Pressure (Admit) 148/80 150/70 130/80 110/72 130/80   Blood Pressure (Exercise) 158/96 140/84 130/84 -- --   Blood Pressure (Exit) 136/80 132/80 126/70 136/72 120/80   Heart Rate (Admit) 79 bpm 99 bpm 90 bpm 84 bpm 69 bpm   Heart Rate (Exercise) 101 bpm 100 bpm 114  bpm 94 bpm 96 bpm   Heart Rate (Exit) 70 bpm 82 bpm 86 bpm 80 bpm 64 bpm   Oxygen  Saturation (Admit) 95 % 89 % 93 %  2L 93 %  2L 95 %  3   Oxygen  Saturation (Exercise) 92 % 88 % 93 %  4L 91 %  3L 92 %  3-4   Oxygen  Saturation (Exit) 95 % 94 % 97 %  3L 94 %  RA 94 %  0   Rating of Perceived Exertion (Exercise) 15 13 13 13 13    Perceived Dyspnea (Exercise) 1 1 1.5 1.5 2   Duration Continue with 30 min of aerobic exercise without signs/symptoms of physical distress. Continue with 30 min of aerobic exercise without signs/symptoms of physical distress. Continue with 30 min of aerobic exercise without signs/symptoms of physical distress. Continue with 30 min of aerobic exercise without signs/symptoms of physical distress. Continue with 30 min of aerobic exercise without signs/symptoms of physical distress.   Intensity THRR unchanged THRR unchanged THRR unchanged THRR unchanged THRR unchanged     Progression   Progression Continue [name] progress workloads [name] maintain intensity without signs/symptoms of physical distress. Continue [name] progress workloads [name] maintain intensity without signs/symptoms of physical distress. Continue [name] progress workloads [name] maintain intensity without signs/symptoms of physical distress. Continue [name] progress workloads [name] maintain intensity without signs/symptoms of physical distress. Continue [name] progress workloads [name] maintain intensity without signs/symptoms of physical distress.     Resistance Training   Training Prescription Yes Yes Yes Yes Yes   Weight Red bands Red bands Red bands Red bands Red bands   Reps 10-15 10-15 10-15 10-15 10-15   Time 10 Minutes 10 Minutes 10 Minutes 10 Minutes 10 Minutes     Oxygen    Oxygen  Continuous Continuous Continuous Continuous Continuous   Liters 2-4 2-4 2-4 2-4 2-4     Treadmill   MPH -- -- -- 1.6 1.5   Grade -- -- -- 1 1.5   Minutes -- -- -- 15 15   METs -- -- -- 2.3 2.2     NuStep   Level 2 3 2 4 4    SPM 98 114 96 112 115    Minutes 15 15 15 15 15    METs 1.8 1.7 1.5 2 1.9     Track   Laps 7 9 6  -- --   Minutes 15 15 15  -- --   METs 2.08 2.38 1.92 -- --     Oxygen    Maintain Oxygen  Saturation 88% or higher 88% or higher 88% or higher 88% or higher 88% or higher    Row Name 01/20/24 1154             Response [name] Exercise   Blood Pressure (Admit) 100/70       Blood Pressure (Exit) 124/78       Heart Rate (Admit) 106 bpm       Heart Rate (Exercise) 110 bpm       Heart Rate (Exit) 94 bpm       Oxygen   Saturation (Admit) 90 %       Oxygen  Saturation (Exercise) 91 %       Oxygen  Saturation (Exit) 94 %       Rating of Perceived Exertion (Exercise) 13       Perceived Dyspnea (Exercise) 1       Duration Continue with 30 min of aerobic exercise without signs/symptoms of physical distress.       Intensity THRR unchanged         Progression   Progression Continue [name] progress workloads [name] maintain intensity without signs/symptoms of physical distress.         Resistance Training   Training Prescription Yes       Weight Red bands       Reps 10-15       Time 10 Minutes         Oxygen    Oxygen  Continuous       Liters 2-4         NuStep   Level 4       SPM 100       Minutes 27       METs 1.6         Oxygen    Maintain Oxygen  Saturation 88% or higher          Exercise Comments:   Exercise Goals and Review:   Exercise Goals     Row Name 11/03/23 1035             Exercise Goals   Increase Physical Activity Yes       Intervention Provide advice, education, support and counseling about physical activity/exercise needs.;Develop an individualized exercise prescription for aerobic and resistive training based on initial evaluation findings, risk stratification, comorbidities and participant's personal goals.       Expected Outcomes Short Term: Attend rehab on a regular basis [name] increase amount of physical activity.;Long Term: Add in home exercise [name] make exercise part of routine and [name] increase  amount of physical activity.;Long Term: Exercising regularly at least 3-5 days a week.       Increase Strength and Stamina Yes       Intervention Provide advice, education, support and counseling about physical activity/exercise needs.;Develop an individualized exercise prescription for aerobic and resistive training based on initial evaluation findings, risk stratification, comorbidities and participant's personal goals.       Expected Outcomes Short Term: Increase workloads from initial exercise prescription for resistance, speed, and METs.;Short Term: Perform resistance training exercises routinely during rehab and add in resistance training at home;Long Term: Improve cardiorespiratory fitness, muscular endurance and strength as measured by increased METs and functional capacity ( )       Able [name] understand and use rate of perceived exertion (RPE) scale Yes       Intervention Provide education and explanation on how [name] use RPE scale       Expected Outcomes Short Term: Able [name] use RPE daily in rehab [name] express subjective intensity level;Long Term:  Able [name] use RPE [name] guide intensity level when exercising independently       Able [name] understand and use Dyspnea scale Yes       Intervention Provide education and explanation on how [name] use Dyspnea scale       Expected Outcomes Short Term: Able [name] use Dyspnea scale daily in rehab [name] express subjective sense of shortness of breath during exertion;Long Term: Able [name] use Dyspnea scale [name] guide intensity level when exercising independently  Knowledge and understanding of Target Heart Rate Range (THRR) Yes       Intervention Provide education and explanation of THRR including how the numbers were predicted and where they are located for reference       Expected Outcomes Short Term: Able [name] state/look up THRR;Short Term: Able [name] use daily as guideline for intensity in rehab;Long Term: Able [name] use THRR [name] govern intensity when exercising independently        Understanding of Exercise Prescription Yes       Intervention Provide education, explanation, and written materials on patient's individual exercise prescription       Expected Outcomes Short Term: Able [name] explain program exercise prescription;Long Term: Able [name] explain home exercise prescription [name] exercise independently          Exercise Goals Re-Evaluation :  Exercise Goals Re-Evaluation     Row Name 11/23/23 1634 12/17/23 1058 01/20/24 0827         Exercise Goal Re-Evaluation   Exercise Goals Review Increase Physical Activity;Able [name] understand and use Dyspnea scale;Understanding of Exercise Prescription;Increase Strength and Stamina;Knowledge and understanding of Target Heart Rate Range (THRR);Able [name] understand and use rate of perceived exertion (RPE) scale Increase Physical Activity;Able [name] understand and use Dyspnea scale;Understanding of Exercise Prescription;Increase Strength and Stamina;Knowledge and understanding of Target Heart Rate Range (THRR);Able [name] understand and use rate of perceived exertion (RPE) scale Increase Physical Activity;Able [name] understand and use Dyspnea scale;Understanding of Exercise Prescription;Increase Strength and Stamina;Knowledge and understanding of Target Heart Rate Range (THRR);Able [name] understand and use rate of perceived exertion (RPE) scale     Comments [name] has completed 4 exercise sessions. She exercises for 15 min on the track and Nustep. [name] averages 2.38 METs on the track and 1.9 METs at level 2 on the Nustep. She performs the warmup and cooldown standing without limitations. Will progress [name] [name] treadmill walking. It is too soon [name] notate any other progressions. Will continue [name] monitor and progress as able. [name] has completed 10 exercise sessions. She exercises for 15 min on the treadmill and Nustep. [name] averages 1.92 METs at 1.2 mph on the treadmill and 1.8 METs at level 3 on the Nustep. She performs the warmup and cooldown standing  without limitations. She has progressed [name] treadmill walking. She tolerates the treadmill fair. Her level on the Nustep has also increased with no change in METs. Will continue [name] monitor and progress as able. [name] has completed 17 exercise sessions. She exercises for 15 min on the treadmill and Nustep. [name] averages 2 METs at 1.2 mph  and 1% incline on the treadmill and 1.8 METs at level 4 on the Nustep. She performs the warmup and cooldown standing without limitations. [name] has increased her incline on the treadmill and level on the Nustep. Her METs have only increased on the treadmill. She remains motivated [name] exercise. Will continue [name] monitor and progress as able.     Expected Outcomes Through exercise at rehab and home, the patient will decrease shortness of breath with daily activities and feel confident in carrying out an exercise regimen at home. Through exercise at rehab and home, the patient will decrease shortness of breath with daily activities and feel confident in carrying out an exercise regimen at home. Through exercise at rehab and home, the patient will decrease shortness of breath with daily activities and feel confident in carrying out an exercise regimen at home.        Discharge Exercise Prescription (Final Exercise  Prescription Changes):  Exercise Prescription Changes - 01/20/24 1154       Response [name] Exercise   Blood Pressure (Admit) 100/70    Blood Pressure (Exit) 124/78    Heart Rate (Admit) 106 bpm    Heart Rate (Exercise) 110 bpm    Heart Rate (Exit) 94 bpm    Oxygen  Saturation (Admit) 90 %    Oxygen  Saturation (Exercise) 91 %    Oxygen  Saturation (Exit) 94 %    Rating of Perceived Exertion (Exercise) 13    Perceived Dyspnea (Exercise) 1    Duration Continue with 30 min of aerobic exercise without signs/symptoms of physical distress.    Intensity THRR unchanged      Progression   Progression Continue [name] progress workloads [name] maintain intensity without  signs/symptoms of physical distress.      Resistance Training   Training Prescription Yes    Weight Red bands    Reps 10-15    Time 10 Minutes      Oxygen    Oxygen  Continuous    Liters 2-4      NuStep   Level 4    SPM 100    Minutes 27    METs 1.6      Oxygen    Maintain Oxygen  Saturation 88% or higher          Nutrition:  Target Goals: Understanding of nutrition guidelines, daily intake of sodium 1500mg , cholesterol 200mg , calories 30% from fat and 7% or less from saturated fats, daily [name] have 5 or more servings of fruits and vegetables.  Biometrics:    Nutrition Therapy Plan and Nutrition Goals:  Nutrition Therapy & Goals - 11/11/23 1337       Nutrition Therapy   Diet heart healthy diet      Personal Nutrition Goals   Nutrition Goal Patient [name] improve diet quality by using the plate method as a guide for meal planning [name] include lean protein/plant protein, fruits, vegetables, whole grains, nonfat dairy as part of a well-balanced diet.    Personal Goal #2 Patient [name] limit sodium intake [name] 2300mg  per day    Comments Patient has medical history of OSA, morbid obesity, HTN, pulmonary HTN, PAH, hyperlipidemia, CHF, DM2. Cholesterol is not well controlled. She continues [name] follow-up with [facility] team and pharmacy regarding elevated blood pressures. She recently started ozempic  [name] aid with weight loss. Patient will benefit from participation in pulmonary rehab for nutrition education, exercise, and lifestyle modification.      Intervention Plan   Intervention Prescribe, educate and counsel regarding individualized specific dietary modifications aiming towards targeted core components such as weight, hypertension, lipid management, diabetes, heart failure and other comorbidities.;Nutrition handout(s) given [name] patient.    Expected Outcomes Short Term Goal: Understand basic principles of dietary content, such as calories, fat, sodium, cholesterol and nutrients.;Long Term  Goal: Adherence [name] prescribed nutrition plan.          Nutrition Assessments:  MEDIFICTS Score Key: >=70 Need [name] make dietary changes  40-70 Heart Healthy Diet <= 40 Therapeutic Level Cholesterol Diet  Flowsheet Row PULMONARY REHAB OTHER RESPIRATORY from 10/17/2020 in [facility] for Heart, Vascular, & Lung Health  Picture Your Plate Total Score on Discharge 65   Picture Your Plate Scores: <59 Unhealthy dietary pattern with much room for improvement. 41-50 Dietary pattern unlikely [name] meet recommendations for good health and room for improvement. 51-60 More healthful dietary pattern, with some room for improvement.  >60 Healthy dietary pattern, although  there may be some specific behaviors that could be improved.    Nutrition Goals Re-Evaluation:  Nutrition Goals Re-Evaluation     Row Name 11/11/23 1337             Goals   Current Weight 246 lb 14.6 oz (112 kg)       Comment cholesterol 248, triglycerides 220, LDL 164, A1c WNL       Expected Outcome Patient has medical history of OSA, morbid obesity, HTN, pulmonary HTN, PAH, hyperlipidemia, CHF, DM2. Cholesterol is not well controlled. She continues [name] follow-up with [facility] team and pharmacy regarding elevated blood pressures. She recently started ozempic  [name] aid with weight loss. Patient will benefit from participation in pulmonary rehab for nutrition education, exercise, and lifestyle modification.          Nutrition Goals Discharge (Final Nutrition Goals Re-Evaluation):  Nutrition Goals Re-Evaluation - 11/11/23 1337       Goals   Current Weight 246 lb 14.6 oz (112 kg)    Comment cholesterol 248, triglycerides 220, LDL 164, A1c WNL    Expected Outcome Patient has medical history of OSA, morbid obesity, HTN, pulmonary HTN, PAH, hyperlipidemia, CHF, DM2. Cholesterol is not well controlled. She continues [name] follow-up with [facility] team and pharmacy regarding elevated blood pressures. She recently  started ozempic  [name] aid with weight loss. Patient will benefit from participation in pulmonary rehab for nutrition education, exercise, and lifestyle modification.          Psychosocial: Target Goals: Acknowledge presence or absence of significant depression and/or stress, maximize coping skills, provide positive support system. Participant is able [name] verbalize types and ability [name] use techniques and skills needed for reducing stress and depression.  Initial Review & Psychosocial Screening:  Initial Psych Review & Screening - 11/03/23 1100       Initial Review   Current issues with History of Depression;Current Anxiety/Panic;Current Stress Concerns    Source of Stress Concerns Chronic Illness;Family;Unable [name] participate in former interests or hobbies    Comments Pt is dealing with stress from her dad recently having a stroke.      Family Dynamics   Good Support System? Yes      Barriers   Psychosocial barriers [name] participate in program The patient should benefit from training in stress management and relaxation.;Psychosocial barriers identified (see note)      Screening Interventions   Interventions Encouraged [name] exercise;[name] provide support and resources with identified psychosocial needs    Expected Outcomes Short Term goal: Utilizing psychosocial counselor, staff and physician [name] assist with identification of specific Stressors or current issues interfering with healing process. Setting desired goal for each stressor or current issue identified.;Long Term Goal: Stressors or current issues are controlled or eliminated.;Short Term goal: Identification and review with participant of any Quality of Life or Depression concerns found by scoring the questionnaire.;Long Term goal: The participant improves quality of Life and PHQ9 Scores as seen by post scores and/or verbalization of changes          Quality of Life Scores:  Scores of 19 and below usually indicate a poorer quality of life  in these areas.  A difference of  2-3 points is a clinically meaningful difference.  A difference of 2-3 points in the total score of the Quality of Life Index has been associated with significant improvement in overall quality of life, self-image, physical symptoms, and general health in studies assessing change in quality of life.  PHQ-9: Review Flowsheet  More data exists      11/03/2023 10/18/2023 10/06/2023 09/08/2023 04/13/2023  Depression screen PHQ 2/9  Decreased Interest 0 0 0 0 0  Down, Depressed, Hopeless 0 0 0 0 1  PHQ - 2 Score 0 0 0 0 1  Altered sleeping 2 - 0 3 2  Tired, decreased energy 1 - 0 3 1  Change in appetite 1 - 0 0 0  Feeling bad or failure about yourself  0 - 0 0 0  Trouble concentrating 0 - 0 0 0  Moving slowly or fidgety/restless 0 - 0 0 0  Suicidal thoughts 0 - 0 0 0  PHQ-9 Score 4 - 0 6 4  Difficult doing work/chores Somewhat difficult - Not difficult at all Somewhat difficult Somewhat difficult   Interpretation of Total Score  Total Score Depression Severity:  1-4 = Minimal depression, 5-9 = Mild depression, 10-14 = Moderate depression, 15-19 = Moderately severe depression, 20-27 = Severe depression   Psychosocial Evaluation and Intervention:  Psychosocial Evaluation - 11/03/23 1141       Psychosocial Evaluation & Interventions   Interventions Stress management education;Relaxation education;Encouraged [name] exercise with the program and follow exercise prescription    Comments Initial PHQ 2-9 score is 0/4. She states she feels more stressed than she does depressed. Pt states her dad has recently had a stroke and will be moving in with her sister. This has caused her [name] feel stressed. Her MD has put in a referral for a mental health specialist. She does not want [name] try psychotropic meds at this time. Staff will provide patient with education and techniques on ways [name] reduce stress.    Expected Outcomes For [name] [name] participate in PR with less stress.    Continue  Psychosocial Services  Follow up required by staff          Psychosocial Re-Evaluation:  Psychosocial Re-Evaluation     Row Name 11/24/23 0847 12/20/23 1039 01/19/24 1040         Psychosocial Re-Evaluation   Current issues with History of Depression;Current Stress Concerns History of Depression;Current Stress Concerns History of Depression;Current Stress Concerns     Comments [name] denies any new stressors or concerns at this time. She is still dealing with her dad's failing health, but feels like exercising is helping with the stress. Her PCP has put in a referral [name] a mental health specialist. Monthly psychosocial re-evaluation as follows: [name] denies any new stressors or concerns currently. She continues [name] deal with her dad's failing health but feels like exercising is helping with the stress. She states her dad has now moved [name] a skilled nursing facility. She is now seeing a therapist [name] help with high levels of stress. [place] declines any additional resources or referrals at this time. Monthly psychosocial re-evaluation as follows: [name] is currently dealing with some new stress regarding her dad's health. He has recently been hospitalized and [name] and her sister have been taking turns staying with him. She continues [name] come [name] class and states that exercise helps her stress. She continues [name] see a therapist [name] help with high levels of stress. [name] declines any additional resources or referrals at this time.     Expected Outcomes For [name] [name] participate in PR with less For Josalynn [name] continue [name] exercise for stress relief and [name] have a positive outlook with good coping skills [name] manage her stress and any other psychosocial issues that may arise. For [name] [name] continue [name] exercise for  stress relief and [name] have a positive outlook with good coping skills [name] manage her stress and any other psychosocial issues that may arise.     Interventions Encouraged [name] attend Pulmonary Rehabilitation  for the exercise Encouraged [name] attend Pulmonary Rehabilitation for the exercise Encouraged [name] attend Pulmonary Rehabilitation for the exercise     Continue Psychosocial Services  Follow up required by staff Follow up required by staff Follow up required by staff        Psychosocial Discharge (Final Psychosocial Re-Evaluation):  Psychosocial Re-Evaluation - 01/19/24 1040       Psychosocial Re-Evaluation   Current issues with History of Depression;Current Stress Concerns    Comments Monthly psychosocial re-evaluation as follows: [name] is currently dealing with some new stress regarding her dad's health. He has recently been hospitalized and [name] and her sister have been taking turns staying with him. She continues [name] come [name] class and states that exercise helps her stress. She continues [name] see a therapist [name] help with high levels of stress. [name] declines any additional resources or referrals at this time.    Expected Outcomes For [name] [name] continue [name] exercise for stress relief and [name] have a positive outlook with good coping skills [name] manage her stress and any other psychosocial issues that may arise.    Interventions Encouraged [name] attend Pulmonary Rehabilitation for the exercise    Continue Psychosocial Services  Follow up required by staff          Education: Education Goals: Education classes will be provided on a weekly basis, covering required topics. Participant will state understanding/return demonstration of topics presented.  Learning Barriers/Preferences:  Learning Barriers/Preferences - 11/03/23 1105       Learning Barriers/Preferences   Learning Barriers Sight    Learning Preferences Written Material;Skilled Demonstration;Individual Instruction          Education Topics: Know Your Numbers Group instruction that is supported by a PowerPoint presentation. Instructor discusses importance of knowing and understanding resting, exercise, and post-exercise oxygen   saturation, heart rate, and blood pressure. Oxygen  saturation, heart rate, blood pressure, rating of perceived exertion, and dyspnea are reviewed along with a normal range for these values.  Flowsheet Row PULMONARY REHAB OTHER RESPIRATORY from 12/16/2023 in [facility] for Heart, Vascular, & Lung Health  Date 12/16/23  Educator EP  Instruction Review Code 1- Verbalizes Understanding    Exercise for the Pulmonary Patient Group instruction that is supported by a PowerPoint presentation. Instructor discusses benefits of exercise, core components of exercise, frequency, duration, and intensity of an exercise routine, importance of utilizing pulse oximetry during exercise, safety while exercising, and options of places [name] exercise outside of rehab.  Flowsheet Row PULMONARY REHAB OTHER RESPIRATORY from 12/09/2023 in [facility] for Heart, Vascular, & Lung Health  Date 12/09/23  Educator EP  Instruction Review Code 1- Verbalizes Understanding    MET Level  Group instruction provided by PowerPoint, verbal discussion, and written material [name] support subject matter. Instructor reviews what METs are and how [name] increase METs.  Flowsheet Row PULMONARY REHAB OTHER RESPIRATORY from 11/11/2023 in [facility] for Heart, Vascular, & Lung Health  Date 11/11/23  Educator EP  Instruction Review Code 1- Verbalizes Understanding    Pulmonary Medications Verbally interactive group education provided by instructor with focus on inhaled medications and proper administration.   Anatomy and Physiology of the Respiratory System Group instruction provided by PowerPoint, verbal discussion, and written material [name] support  subject matter. Instructor reviews respiratory cycle and anatomical components of the respiratory system and their functions. Instructor also reviews differences in obstructive and restrictive respiratory diseases with examples of  each.  Flowsheet Row PULMONARY REHAB OTHER RESPIRATORY from 11/25/2023 in [facility] for Heart, Vascular, & Lung Health  Date 11/25/23  Educator RT  Instruction Review Code 1- Verbalizes Understanding    Oxygen  Safety Group instruction provided by PowerPoint, verbal discussion, and written material [name] support subject matter. There is an overview of "What is Oxygen " and "Why do we need it".  Instructor also reviews how [name] create a safe environment for oxygen  use, the importance of using oxygen  as prescribed, and the risks of noncompliance. There is a brief discussion on traveling with oxygen  and resources the patient may utilize. Flowsheet Row PULMONARY REHAB OTHER RESPIRATORY from 12/23/2023 in [facility] for Heart, Vascular, & Lung Health  Date 12/23/23  Educator RN  Instruction Review Code 1- Verbalizes Understanding    Oxygen  Use Group instruction provided by PowerPoint, verbal discussion, and written material [name] discuss how supplemental oxygen  is prescribed and different types of oxygen  supply systems. Resources for more information are provided.  Flowsheet Row PULMONARY REHAB OTHER RESPIRATORY from 12/30/2023 in [facility] for Heart, Vascular, & Lung Health  Date 12/30/23  Educator RT  Instruction Review Code 1- Verbalizes Understanding    Breathing Techniques Group instruction that is supported by demonstration and informational handouts. Instructor discusses the benefits of pursed lip and diaphragmatic breathing and detailed demonstration on how [name] perform both.  Flowsheet Row PULMONARY REHAB OTHER RESPIRATORY from 01/06/2024 in [facility] for Heart, Vascular, & Lung Health  Date 01/06/24  Educator RN  Instruction Review Code 1- Verbalizes Understanding     Risk Factor Reduction Group instruction that is supported by a PowerPoint presentation. Instructor discusses the definition of a  risk factor, different risk factors for pulmonary disease, and how the heart and lungs work together. Flowsheet Row PULMONARY REHAB OTHER RESPIRATORY from 10/17/2020 in [facility] for Heart, Vascular, & Lung Health  Date 09/19/20  Educator handout    Pulmonary Diseases Group instruction provided by PowerPoint, verbal discussion, and written material [name] support subject matter. Instructor gives an overview of the different type of pulmonary diseases. There is also a discussion on risk factors and symptoms as well as ways [name] manage the diseases. Flowsheet Row PULMONARY REHAB OTHER RESPIRATORY from 11/18/2023 in [facility] for Heart, Vascular, & Lung Health  Date 11/18/23  Educator RT  Instruction Review Code 1- Verbalizes Understanding    Stress and Energy Conservation Group instruction provided by PowerPoint, verbal discussion, and written material [name] support subject matter. Instructor gives an overview of stress and the impact it can have on the body. Instructor also reviews ways [name] reduce stress. There is also a discussion on energy conservation and ways [name] conserve energy throughout the day. Flowsheet Row PULMONARY REHAB OTHER RESPIRATORY from 01/13/2024 in [facility]  Date 01/13/24  Educator RN  Instruction Review Code 1- Verbalizes Understanding    Warning Signs and Symptoms Group instruction provided by PowerPoint, verbal discussion, and written material [name] support subject matter. Instructor reviews warning signs and symptoms of stroke, heart attack, cold and flu. Instructor also reviews ways [name] prevent the spread of infection. Flowsheet Row PULMONARY REHAB OTHER RESPIRATORY from 01/20/2024 in [place]  [facility]  Date 01/20/24  Educator RN  Instruction Review Code 1- Verbalizes Understanding    Other Education Group or  individual verbal, written, or video instructions that support the educational goals of the pulmonary rehab program. Flowsheet Row PULMONARY REHAB OTHER RESPIRATORY from 10/17/2020 in [facility]  Date 10/17/20  [facility] Plate Handout]  Educator Meredith-H/O  Instruction Review Code 1- Verbalizes Understanding     Knowledge Questionnaire Score:  Knowledge Questionnaire Score - 11/03/23 1034       Knowledge Questionnaire Score   Pre Score 16/18          Core Components/Risk Factors/Patient Goals at Admission:  Personal Goals and Risk Factors at Admission - 11/03/23 1105       Core Components/Risk Factors/Patient Goals on Admission    Weight Management Yes;Weight Loss    Intervention Weight Management: Develop a combined nutrition and exercise program designed [name] reach desired caloric intake, while maintaining appropriate intake of nutrient and fiber, sodium and fats, and appropriate energy expenditure required for the weight goal.;Weight Management: Provide education and appropriate resources [name] help participant work on and attain dietary goals.;Weight Management/Obesity: Establish reasonable short term and long term weight goals.;Obesity: Provide education and appropriate resources [name] help participant work on and attain dietary goals.    Admit Weight 247 lb 9.2 oz (112.3 kg)    Expected Outcomes Short Term: Continue [name] assess and modify interventions until short term weight is achieved;Long Term: Adherence [name] nutrition and physical activity/exercise program aimed toward attainment of established weight goal;Weight Loss: Understanding of general recommendations for a balanced deficit meal plan, which promotes 1-2 lb weight loss per week and includes a negative energy balance of [id number] kcal/d;Understanding recommendations for meals [name] include 15-35% energy as protein, 25-35% energy from fat, 35-60% energy from carbohydrates, less than  200mg  of dietary cholesterol, 20-35 gm of total fiber daily;Understanding of distribution of calorie intake throughout the day with the consumption of 4-5 meals/snacks    Improve shortness of breath with ADL's Yes    Intervention Provide education, individualized exercise plan and daily activity instruction [name] help decrease symptoms of SOB with activities of daily living.    Expected Outcomes Short Term: Improve cardiorespiratory fitness [name] achieve a reduction of symptoms when performing ADLs;Long Term: Be able [name] perform more ADLs without symptoms or delay the onset of symptoms    Hypertension Yes    Intervention Provide education on lifestyle modifcations including regular physical activity/exercise, weight management, moderate sodium restriction and increased consumption of fresh fruit, vegetables, and low fat dairy, alcohol moderation, and smoking cessation.;Monitor prescription use compliance.    Expected Outcomes Short Term: Continued assessment and intervention until BP is < 140/72mm HG in hypertensive participants. < 130/64mm HG in hypertensive participants with diabetes, heart failure or chronic kidney disease.;Long Term: Maintenance of blood pressure at goal levels.    Stress Yes    Intervention Offer individual and/or small group education and counseling on adjustment [name] heart disease, stress management and health-related lifestyle change. Teach and support self-help strategies.;Refer participants experiencing significant psychosocial distress [name] appropriate mental health specialists for further evaluation and treatment. When possible, include family members and significant others in education/counseling sessions.    Expected Outcomes Short Term: Participant demonstrates changes in health-related behavior, relaxation and other stress management skills, ability [name] obtain effective social support, and compliance with psychotropic medications if prescribed.;Long Term: Emotional wellbeing is  indicated  by absence of clinically significant psychosocial distress or social isolation.          Core Components/Risk Factors/Patient Goals Review:   Goals and Risk Factor Review     Row Name 11/24/23 0856 12/20/23 1042 01/19/24 1044         Core Components/Risk Factors/Patient Goals Review   Personal Goals Review Weight Management/Obesity;Improve shortness of breath with ADL's;Develop more efficient breathing techniques such as purse lipped breathing and diaphragmatic breathing and practicing self-pacing with activity.;Hypertension;Stress Weight Management/Obesity;Improve shortness of breath with ADL's;Develop more efficient breathing techniques such as purse lipped breathing and diaphragmatic breathing and practicing self-pacing with activity.;Hypertension;Stress Weight Management/Obesity;Improve shortness of breath with ADL's;Stress     Review Monthly review of patient's Core Components/Risk Factors/Patient Goals are as follows: Goal progressing for weight loss. Maxene has met with the staff dietitian on ways [name] facilitate her weight loss goals.  Goal progressing for improving shortness of breath with ADL's. [name] is currently using 2-3 L O2 while exercising [name] keep sats >90%. Goal progressing for developing more efficient breathing techniques such as purse lipped breathing and diaphragmatic breathing; and practicing self-pacing with activity. Goal progressing for hypertension. [name]'s meds have been adjusted in hopes [name] have better B/P control. Goal progressing for stress. We will continue [name] monitor [name]'s progress throughout the program. Monthly review of patient's Core Components/Risk Factors/Patient Goals are as follows: Goal progressing for weight loss. [name] has met with the staff dietitian on ways [name] facilitate her weight loss goals. She is choosing a wide variety of foods and trying not [name] eat out as much. Currently she is up ~5# since starting the program. Goal progressing for  improving shortness of breath with ADL's. Unfortunately, Laree had [name] increase her oxygen  from 2-3 L [name] now 4L O2 while exercising [name] keep sats >90%. She has increased her workload, speed, and METS on the Nustep and speed and incline on the treadmill. She is working hard [name] obtain her goals. Goal met for developing more efficient breathing techniques such as purse lipped breathing and diaphragmatic breathing; and practicing self-pacing with activity. [name] can perform purse lipped breathing while short of breath. She demonstrated this while performing the warmup and while exercising. She can initiate PLB on her own. She works on diaphragmatic breathing at home. Goal met for controlled hypertension. Toia's meds have been adjusted, and her BP has been WNL for the last 5 weeks. Goal progressing for decreased stress. Feleshia continues [name] exercise for stress relief and sees a therapist. We will continue [name] monitor Kare's progress throughout the program. Monthly review of patient's Core Components/Risk Factors/Patient Goals are as follows: Goal progressing for weight loss. Currently she is up ~5# since starting the program. Goal progressing for improving shortness of breath with ADL's. She is currently using 3-4L O2 while exercising [name] keep sast >90%. She has increased her workload, speed, and METS on the Nustep and speed and incline on the treadmill. She is working hard [name] obtain her goals. Goal progressing for decreased stress. Brock continues [name] exercise for stress relief and sees a therapist. We will continue [name] monitor Maliyah's progress throughout the program.     Expected Outcomes [name] improve shortness of breath with ADL's, develop more efficient breathing techniques such as purse lipped breathing and diaphragmatic breathing; and practicing self-pacing with activity, lose weight, have better B/P control and reduce stress Pt will show progress toward meeting expected goals and outcomes. Pt will show progress  toward meeting expected goals and outcomes.  Core Components/Risk Factors/Patient Goals at Discharge (Final Review):   Goals and Risk Factor Review - 01/19/24 1044       Core Components/Risk Factors/Patient Goals Review   Personal Goals Review Weight Management/Obesity;Improve shortness of breath with ADL's;Stress    Review Monthly review of patient's Core Components/Risk Factors/Patient Goals are as follows: Goal progressing for weight loss. Currently she is up ~5# since starting the program. Goal progressing for improving shortness of breath with ADL's. She is currently using 3-4L O2 while exercising [name] keep sast >90%. She has increased her workload, speed, and METS on the Nustep and speed and incline on the treadmill. She is working hard [name] obtain her goals. Goal progressing for decreased stress. Alaynah continues [name] exercise for stress relief and sees a therapist. We will continue [name] monitor Bridgett's progress throughout the program.    Expected Outcomes Pt will show progress toward meeting expected goals and outcomes.          ITP Comments:   Comments: Pt is making expected progress toward Pulmonary Rehab goals after completing 18 session(s). Recommend continued exercise, life style modification, education, and utilization of breathing techniques [name] increase stamina and strength, while also decreasing shortness of breath with exertion.  Dr. Slater Staff is Medical Director for Pulmonary Rehab at [facility].

## 2024-01-27 ENCOUNTER — Encounter (HOSPITAL_COMMUNITY): Admission: RE | Admit: 2024-01-27 | Source: Ambulatory Visit

## 2024-01-28 ENCOUNTER — Other Ambulatory Visit (HOSPITAL_COMMUNITY): Payer: Self-pay

## 2024-01-31 ENCOUNTER — Other Ambulatory Visit (HOSPITAL_COMMUNITY): Payer: Self-pay

## 2024-01-31 MED ORDER — MOUNJARO 7.5 MG/0.5ML ~~LOC~~ SOAJ
7.5000 mg | SUBCUTANEOUS | 0 refills | Status: AC
  Filled 2024-01-31: qty 6, 84d supply, fill #0

## 2024-01-31 MED ORDER — ONDANSETRON 4 MG PO TBDP
8.0000 mg | ORAL_TABLET | Freq: Two times a day (BID) | ORAL | 3 refills | Status: AC | PRN
  Filled 2024-01-31: qty 120, 30d supply, fill #0

## 2024-02-01 ENCOUNTER — Encounter (HOSPITAL_COMMUNITY)
Admission: RE | Admit: 2024-02-01 | Discharge: 2024-02-01 | Disposition: A | Discharge status: Home / Self Care | Source: Ambulatory Visit | Attending: Pulmonary Disease | Admitting: Pulmonary Disease

## 2024-02-01 DIAGNOSIS — I2721 Secondary pulmonary arterial hypertension: Secondary | ICD-10-CM | POA: Diagnosis present

## 2024-02-01 NOTE — Progress Notes (Signed)
 Daily Session Note  Patient Details  Name: NESA DISTEL MRN: [id number] Date of Birth: 01-13-1966 Referring Provider:   Conrad Ports Pulmonary Rehab Walk Test from 11/03/2023 in [facility]  Referring Provider [place]    Encounter Date: 02/01/2024  Check In:  Session Check In - 02/01/24 1128       Check-In   Supervising physician immediately available to respond to emergencies [facility] MD immediately available    Physician(s) Rosaline Bane, NP    Location [facility]    Staff Present Ronal Levin, RN, BSN;Nirvan Laban Claudene, RT;Randi [facility] BS, ACSM-CEP, Exercise Physiologist;Kaylee Nicholaus, MS, ACSM-CEP, Exercise Physiologist    Virtual Visit No    Medication changes reported     No    Fall or balance concerns reported    No    Tobacco Cessation No Change    Warm-up and Cool-down Performed as group-led instruction    Resistance Training Performed Yes    VAD Patient? No    PAD/SET Patient? No      Pain Assessment   Currently in Pain? No/denies          Capillary Blood Glucose: No results found for this or any previous visit (from the past 24 hours).    Social History   Tobacco Use  Smoking Status Former   Current packs/day: 0.00   Average packs/day: 0.3 packs/day for 10.0 years (3.3 ttl pk-yrs)   Types: Cigarettes   Start date: 10/03/2001   Quit date: 10/04/2011   Years since quitting: 12.3  Smokeless Tobacco Never  Tobacco Comments   No longer smoking cigarettes    Goals Met:  Proper associated with RPD/PD & O2 Sat Independence with exercise equipment Exercise tolerated well No report of concerns or symptoms today Strength training completed today  Goals Unmet:  Not Applicable  Comments: Service time is from 1023 to 1138.    Dr. Slater Staff is Medical Director for Pulmonary Rehab at [facility].

## 2024-02-02 ENCOUNTER — Ambulatory Visit (INDEPENDENT_AMBULATORY_CARE_PROVIDER_SITE_OTHER): Payer: Self-pay | Admitting: Physician Assistant

## 2024-02-03 ENCOUNTER — Encounter (HOSPITAL_COMMUNITY)
Admission: RE | Admit: 2024-02-03 | Discharge: 2024-02-03 | Disposition: A | Discharge status: Home / Self Care | Source: Ambulatory Visit | Attending: Pulmonary Disease | Admitting: Pulmonary Disease

## 2024-02-03 VITALS — Wt 256.2 lb

## 2024-02-03 DIAGNOSIS — I2721 Secondary pulmonary arterial hypertension: Secondary | ICD-10-CM | POA: Diagnosis not present

## 2024-02-03 NOTE — Progress Notes (Signed)
 Daily Session Note  Patient Details  Name: [name] MRN: [id number] Date of Birth: 02-17-1966 Referring Provider:   Conrad Ports Pulmonary Rehab Walk Test from 11/03/2023 in [facility]  Referring Provider Levarge    Encounter Date: 02/03/2024  Check In:  Session Check In - 02/03/24 1135       Check-In   Supervising physician immediately available to respond to emergencies [facility] MD immediately available    Physician(s) Orren Fabry, NP    Location [facility]    Staff Present Augustin Sharps, Neita Moats, MS, ACSM-CEP, Exercise Physiologist;Carlette Bernett, RN, Mallory Parkins, MS, ACSM-CEP, CCRP, Exercise Physiologist;Maria Cyrus, RN, BSN;Johnny Porter, MS, Exercise Physiologist    Virtual Visit No    Medication changes reported     No    Fall or balance concerns reported    No    Tobacco Cessation No Change    Warm-up and Cool-down Performed as group-led instruction    Resistance Training Performed Yes    VAD Patient? No    PAD/SET Patient? No      Pain Assessment   Currently in Pain? No/denies    Multiple Pain Sites No          Capillary Blood Glucose: No results found for this or any previous visit (from the past 24 hours).    Social History   Tobacco Use  Smoking Status Former   Current packs/day: 0.00   Average packs/day: 0.3 packs/day for 10.0 years (3.3 ttl pk-yrs)   Types: Cigarettes   Start date: 10/03/2001   Quit date: 10/04/2011   Years since quitting: 12.3  Smokeless Tobacco Never  Tobacco Comments   No longer smoking cigarettes    Goals Met:  Proper associated with RPD/PD & O2 Sat Independence with exercise equipment Exercise tolerated well No report of concerns or symptoms today Strength training completed today  Goals Unmet:  Not Applicable  Comments: Service time is from 1027 to 1137.    Dr. Slater Staff is Medical Director for Pulmonary Rehab at  [facility].

## 2024-02-08 ENCOUNTER — Encounter (HOSPITAL_COMMUNITY)
Admission: RE | Admit: 2024-02-08 | Discharge: 2024-02-08 | Disposition: A | Discharge status: Home / Self Care | Source: Ambulatory Visit | Attending: Pulmonary Disease | Admitting: Pulmonary Disease

## 2024-02-08 DIAGNOSIS — I2721 Secondary pulmonary arterial hypertension: Secondary | ICD-10-CM

## 2024-02-10 ENCOUNTER — Encounter (HOSPITAL_COMMUNITY)
Admission: RE | Admit: 2024-02-10 | Discharge: 2024-02-10 | Disposition: A | Discharge status: Home / Self Care | Source: Ambulatory Visit | Attending: Pulmonary Disease | Admitting: Pulmonary Disease

## 2024-02-10 DIAGNOSIS — I2721 Secondary pulmonary arterial hypertension: Secondary | ICD-10-CM | POA: Diagnosis not present

## 2024-02-10 NOTE — Progress Notes (Signed)
 Daily Session Note  Patient Details  Name: [name] MRN: [id number] Date of Birth: [birth year] Referring Provider:   Conrad Ports Pulmonary Rehab Walk Test from 11/03/2023 in [facility]  Referring Provider Levarge    Encounter Date: 02/10/2024  Check In:  Session Check In - 02/10/24 1135       Check-In   Supervising physician immediately available to respond to emergencies [facility] MD immediately available    Physician(s) Josefa Beauvais, NP    Location [facility]    Staff Present Augustin Sharps, Neita Moats, MS, ACSM-CEP, Exercise Physiologist;Jaime Grizzell Harvy, RN, BSN;Randi Reeve BS, ACSM-CEP, Exercise Physiologist    Virtual Visit No    Medication changes reported     No    Fall or balance concerns reported    No    Tobacco Cessation No Change    Warm-up and Cool-down Performed as group-led instruction    Resistance Training Performed Yes    VAD Patient? No    PAD/SET Patient? No      Pain Assessment   Currently in Pain? No/denies    Multiple Pain Sites No          Capillary Blood Glucose: No results found for this or any previous visit (from the past 24 hours).    Social History   Tobacco Use  Smoking Status Former   Current packs/day: 0.00   Average packs/day: 0.3 packs/day for 10.0 years (3.3 ttl pk-yrs)   Types: Cigarettes   Start date: 10/03/2001   Quit date: 10/04/2011   Years since quitting: 12.3  Smokeless Tobacco Never  Tobacco Comments   No longer smoking cigarettes    Goals Met:  Independence with exercise equipment Exercise tolerated well Queuing for purse lip breathing No report of concerns or symptoms today Strength training completed today  Goals Unmet:  Not Applicable  Comments: Service time is from 1015 to 1142    Dr. Slater Staff is Medical Director for Pulmonary Rehab at [facility].

## 2024-02-14 ENCOUNTER — Ambulatory Visit: Payer: Self-pay | Admitting: Nurse Practitioner

## 2024-02-15 ENCOUNTER — Encounter (HOSPITAL_COMMUNITY): Admission: RE | Admit: 2024-02-15 | Source: Ambulatory Visit

## 2024-02-16 ENCOUNTER — Telehealth: Payer: Self-pay

## 2024-02-16 NOTE — Patient Instructions (Signed)
 [name] - I am sorry I was unable to reach you today for our scheduled appointment.   Your next care management appointment is by telephone on Monday, December 15 at 12:00 PM  Please call the care guide team at [phone] if you need to cancel, schedule, or reschedule an appointment.   Please call the Suicide and Crisis Lifeline: 988 if you are experiencing a Mental Health or Behavioral Health Crisis or need someone to talk to.  Thank you,   Clayborne Ly RN BSN CCM [place]  [facility], [facility] Nurse Care Coordinator  Direct Dial: [phone] Website: [name]

## 2024-02-17 ENCOUNTER — Other Ambulatory Visit (HOSPITAL_COMMUNITY): Payer: Self-pay

## 2024-02-17 ENCOUNTER — Encounter (HOSPITAL_COMMUNITY)
Admission: RE | Admit: 2024-02-17 | Discharge: 2024-02-17 | Disposition: A | Discharge status: Home / Self Care | Source: Ambulatory Visit | Attending: Pulmonary Disease | Admitting: Pulmonary Disease

## 2024-02-17 ENCOUNTER — Encounter (HOSPITAL_COMMUNITY)
Admission: RE | Admit: 2024-02-17 | Discharge: 2024-02-17 | Disposition: A | Source: Ambulatory Visit | Attending: Pulmonary Disease

## 2024-02-17 DIAGNOSIS — I2721 Secondary pulmonary arterial hypertension: Secondary | ICD-10-CM | POA: Diagnosis not present

## 2024-02-17 MED ORDER — FLUCONAZOLE 150 MG PO TABS
150.0000 mg | ORAL_TABLET | ORAL | 0 refills | Status: DC
  Filled 2024-02-17: qty 2, 6d supply, fill #0
  Filled 2024-02-17: qty 2, 3d supply, fill #0

## 2024-02-18 ENCOUNTER — Other Ambulatory Visit (HOSPITAL_COMMUNITY): Payer: Self-pay

## 2024-02-18 NOTE — Progress Notes (Signed)
 Discharge Progress Report  Patient Details  Name: [name] MRN: [id number] Date of Birth: [birth year] Referring Provider:   [facility] Walk Test from 11/03/2023 in [facility]  Referring Provider [place]     Number of Visits: 21  Reason for Discharge:  Patient has met program and personal goals.  Smoking History:  Social History   Tobacco Use  Smoking Status Former   Current packs/day: 0.00   Average packs/day: 0.3 packs/day for 10.0 years (3.3 ttl pk-yrs)   Types: Cigarettes   Start date: 10/03/2001   Quit date: 10/04/2011   Years since quitting: 12.3  Smokeless Tobacco Never  Tobacco Comments   No longer smoking cigarettes    Diagnosis:  No diagnosis found.  ADL [facility]:  Pulmonary Assessment Scores     Row Name 11/03/23 1149 02/03/24 1219       ADL [facility]   ADL Phase Entry Exit    SOB Score total 56 53      CAT Score   CAT Score 21 22      mMRC Score   mMRC Score 4 4       Initial Exercise Prescription:  Initial Exercise Prescription - 11/03/23 1200       Date of Initial Exercise RX and Referring Provider   Date 11/03/23    Referring Provider [place]    Expected Discharge Date 02/01/24      Oxygen    Oxygen  Continuous    Liters 4    Maintain Oxygen  Saturation 88% or higher      NuStep   Level 2    SPM 80    Minutes 15    METs 1.5      Track   Minutes 15    METs 2.3      Prescription Details   Frequency (times per week) 2    Duration Progress to 30 minutes of continuous aerobic without signs/symptoms of physical distress      Intensity   THRR 40-80% of Max Heartrate 65-130    Ratings of Perceived Exertion 11-13    Perceived Dyspnea 0-4      Progression   Progression Continue to progress workloads to maintain intensity without signs/symptoms of physical distress.      Resistance Training   Training Prescription Yes    Weight Red bands    Reps 10-15           Discharge Exercise Prescription (Final Exercise Prescription Changes):  Exercise Prescription Changes - 02/03/24 1120       Response to Exercise   Blood Pressure (Admit) 106/78    Blood Pressure (Exit) 110/66    Heart Rate (Admit) 108 bpm    Heart Rate (Exercise) 115 bpm    Heart Rate (Exit) 101 bpm    Oxygen  Saturation (Admit) 91 %    Oxygen  Saturation (Exercise) 93 %    Oxygen  Saturation (Exit) 97 %    Rating of Perceived Exertion (Exercise) 13    Perceived Dyspnea (Exercise) 1    Duration Continue with 30 min of aerobic exercise without signs/symptoms of physical distress.    Intensity THRR unchanged      Progression   Progression Continue to progress workloads to maintain intensity without signs/symptoms of physical distress.      Resistance Training   Training Prescription Yes    Weight Red bands    Reps 10-15    Time 10 Minutes  Oxygen    Oxygen  Continuous    Liters 2-4      NuStep   Level 4    SPM 119    Minutes 30    METs 1.8      Oxygen    Maintain Oxygen  Saturation 88% or higher          Functional Capacity:  6 Minute Walk     Row Name 11/11/23 1205 02/17/24 1516       6 Minute Walk   Phase Initial Discharge    Distance 1020 feet 1080 feet    Distance % Change -- 5.88 %    Distance Feet Change -- 60 ft    Walk Time 6 minutes 6 minutes    # of Rest Breaks 0 0    MPH 1.93 2.05    METS 2.6 2.86    RPE 13 13    Perceived Dyspnea  1 2    VO2 Peak 9.1 10.02    Symptoms No No    Resting HR 79 bpm 100 bpm    Resting BP 150/90 142/84    Resting Oxygen  Saturation  94 % 96 %    Exercise Oxygen  Saturation  during 6 min walk 87 % 87 %    Max Ex. HR 117 bpm 127 bpm    Max Ex. BP 142/80 158/90    2 Minute Post BP 140/80 140/86      Interval HR   1 Minute HR 104 120    2 Minute HR 103 124    3 Minute HR 103 126    4 Minute HR 117 127    5 Minute HR 114 125    6 Minute HR 114 128    2 Minute Post HR 83 100    Interval Heart Rate?  Yes Yes      Interval Oxygen    Interval Oxygen ? Yes Yes    Baseline Oxygen  Saturation % 94 % 96 %    1 Minute Oxygen  Saturation % 91 % 94 %    1 Minute Liters of Oxygen  0 L 3 L    2 Minute Oxygen  Saturation % 90 %  87% at 1:26 89 %    2 Minute Liters of Oxygen  0 L  increased to 2L 3 L    3 Minute Oxygen  Saturation % 89 % 89 %    3 Minute Liters of Oxygen  2 L  increased to 4L 3 L    4 Minute Oxygen  Saturation % 90 % 87 %    4 Minute Liters of Oxygen  4 L 3 L  increased to 4L    5 Minute Oxygen  Saturation % 91 % 94 %    5 Minute Liters of Oxygen  4 L 4 L    6 Minute Oxygen  Saturation % 90 % 89 %    6 Minute Liters of Oxygen  4 L 4 L    2 Minute Post Oxygen  Saturation % 94 % 97 %    2 Minute Post Liters of Oxygen  4 L 4 L       Psychological, QOL, Others - Outcomes: PHQ 2/9:    02/03/2024   12:18 PM 11/03/2023   10:31 AM 10/18/2023   12:11 PM 10/06/2023    2:48 PM 09/08/2023   11:46 AM  Depression screen PHQ 2/9  Decreased Interest 1 0 0 0 0  Down, Depressed, Hopeless 0 0 0 0 0  PHQ - 2 Score 1 0 0 0 0  Altered sleeping 2  2  0 3  Tired, decreased energy 3 1  0 3  Change in appetite 1 1  0 0  Feeling bad or failure about yourself  0 0  0 0  Trouble concentrating 1 0  0 0  Moving slowly or fidgety/restless 1 0  0 0  Suicidal thoughts 0 0  0 0  PHQ-9 Score 9 4   0  6   Difficult doing work/chores Somewhat difficult Somewhat difficult  Not difficult at all Somewhat difficult     Data saved with a previous flowsheet row definition    Quality of Life:   Personal Goals: Goals established at orientation with interventions provided to work toward goal.  Personal Goals and Risk Factors at Admission - 11/03/23 1105       Core Components/Risk Factors/Patient Goals on Admission    Weight Management Yes;Weight Loss    Intervention Weight Management: Develop a combined nutrition and exercise program designed to reach desired caloric intake, while maintaining appropriate intake of  nutrient and fiber, sodium and fats, and appropriate energy expenditure required for the weight goal.;Weight Management: Provide education and appropriate resources to help participant work on and attain dietary goals.;Weight Management/Obesity: Establish reasonable short term and long term weight goals.;Obesity: Provide education and appropriate resources to help participant work on and attain dietary goals.    Admit Weight 247 lb 9.2 oz (112.3 kg)    Expected Outcomes Short Term: Continue to assess and modify interventions until short term weight is achieved;Long Term: Adherence to nutrition and physical activity/exercise program aimed toward attainment of established weight goal;Weight Loss: Understanding of general recommendations for a balanced deficit meal plan, which promotes 1-2 lb weight loss per week and includes a negative energy balance of [phone] kcal/d;Understanding recommendations for meals to include 15-35% energy as protein, 25-35% energy from fat, 35-60% energy from carbohydrates, less than 200mg  of dietary cholesterol, 20-35 gm of total fiber daily;Understanding of distribution of calorie intake throughout the day with the consumption of 4-5 meals/snacks    Improve shortness of breath with ADL's Yes    Intervention Provide education, individualized exercise plan and daily activity instruction to help decrease symptoms of SOB with activities of daily living.    Expected Outcomes Short Term: Improve cardiorespiratory fitness to achieve a reduction of symptoms when performing ADLs;Long Term: Be able to perform more ADLs without symptoms or delay the onset of symptoms    Hypertension Yes    Intervention Provide education on lifestyle modifcations including regular physical activity/exercise, weight management, moderate sodium restriction and increased consumption of fresh fruit, vegetables, and low fat dairy, alcohol moderation, and smoking cessation.;Monitor prescription use compliance.     Expected Outcomes Short Term: Continued assessment and intervention until BP is < 140/10mm HG in hypertensive participants. < 130/86mm HG in hypertensive participants with diabetes, heart failure or chronic kidney disease.;Long Term: Maintenance of blood pressure at goal levels.    Stress Yes    Intervention Offer individual and/or small group education and counseling on adjustment to heart disease, stress management and health-related lifestyle change. Teach and support self-help strategies.;Refer participants experiencing significant psychosocial distress to appropriate mental health specialists for further evaluation and treatment. When possible, include family members and significant others in education/counseling sessions.    Expected Outcomes Short Term: Participant demonstrates changes in health-related behavior, relaxation and other stress management skills, ability to obtain effective social support, and compliance with psychotropic medications if prescribed.;Long Term: Emotional wellbeing is indicated by absence of clinically significant psychosocial  distress or social isolation.           Personal Goals Discharge:  Goals and Risk Factor Review     Row Name 11/24/23 0856 12/20/23 1042 01/19/24 1044 02/18/24 1413       Core Components/Risk Factors/Patient Goals Review   Personal Goals Review Weight Management/Obesity;Improve shortness of breath with ADL's;Develop more efficient breathing techniques such as purse lipped breathing and diaphragmatic breathing and practicing self-pacing with activity.;Hypertension;Stress Weight Management/Obesity;Improve shortness of breath with ADL's;Develop more efficient breathing techniques such as purse lipped breathing and diaphragmatic breathing and practicing self-pacing with activity.;Hypertension;Stress Weight Management/Obesity;Improve shortness of breath with ADL's;Stress --    Review Monthly review of patient's Core Components/Risk Factors/Patient  Goals are as follows: Goal progressing for weight loss. Rosilyn has met with the staff dietitian on ways to facilitate her weight loss goals.  Goal progressing for improving shortness of breath with ADL's. Annica is currently using 2-3 L O2 while exercising to keep sats >90%. Goal progressing for developing more efficient breathing techniques such as purse lipped breathing and diaphragmatic breathing; and practicing self-pacing with activity. Goal progressing for hypertension. Rhiley's meds have been adjusted in hopes to have better B/P control. Goal progressing for stress. We will continue to monitor Minsa's progress throughout the program. Monthly review of patient's Core Components/Risk Factors/Patient Goals are as follows: Goal progressing for weight loss. Videl has met with the staff dietitian on ways to facilitate her weight loss goals. She is choosing a wide variety of foods and trying not to eat out as much. Currently she is up ~5# since starting the program. Goal progressing for improving shortness of breath with ADL's. Unfortunately, Enora had to increase her oxygen  from 2-3 L to now 4L O2 while exercising to keep sats >90%. She has increased her workload, speed, and METS on the Nustep and speed and incline on the treadmill. She is working hard to obtain her goals. Goal met for developing more efficient breathing techniques such as purse lipped breathing and diaphragmatic breathing; and practicing self-pacing with activity. Dickey can perform purse lipped breathing while short of breath. She demonstrated this while performing the warmup and while exercising. She can initiate PLB on her own. She works on diaphragmatic breathing at home. Goal met for controlled hypertension. Aquita's meds have been adjusted, and her BP has been WNL for the last 5 weeks. Goal progressing for decreased stress. Reyanna continues to exercise for stress relief and sees a therapist. We will continue to monitor Annaliesa's progress  throughout the program. Monthly review of patient's Core Components/Risk Factors/Patient Goals are as follows: Goal progressing for weight loss. Currently she is up ~5# since starting the program. Goal progressing for improving shortness of breath with ADL's. She is currently using 3-4L O2 while exercising to keep sast >90%. She has increased her workload, speed, and METS on the Nustep and speed and incline on the treadmill. She is working hard to obtain her goals. Goal progressing for decreased stress. English continues to exercise for stress relief and sees a therapist. We will continue to monitor Luceal's progress throughout the program. Karlina graduated from the [facility] program on 02/17/24. She did not meet her goal for weight loss. She did meet her goal for improving shortness of breath with ADL's. Initial SOB score 56/post 53.    Expected Outcomes To improve shortness of breath with ADL's, develop more efficient breathing techniques such as purse lipped breathing and diaphragmatic breathing; and practicing self-pacing with activity, lose weight, have better B/P  control and reduce stress Pt will show progress toward meeting expected goals and outcomes. Pt will show progress toward meeting expected goals and outcomes. To continue to exercise and modify her nutrition and lifestyle post graduation       Exercise Goals and Review:  Exercise Goals     Row Name 11/03/23 1035             Exercise Goals   Increase Physical Activity Yes       Intervention Provide advice, education, support and counseling about physical activity/exercise needs.;Develop an individualized exercise prescription for aerobic and resistive training based on initial evaluation findings, risk stratification, comorbidities and participant's personal goals.       Expected Outcomes Short Term: Attend rehab on a regular basis to increase amount of physical activity.;Long Term: Add in home exercise to make exercise part of routine and to  increase amount of physical activity.;Long Term: Exercising regularly at least 3-5 days a week.       Increase Strength and Stamina Yes       Intervention Provide advice, education, support and counseling about physical activity/exercise needs.;Develop an individualized exercise prescription for aerobic and resistive training based on initial evaluation findings, risk stratification, comorbidities and participant's personal goals.       Expected Outcomes Short Term: Increase workloads from initial exercise prescription for resistance, speed, and METs.;Short Term: Perform resistance training exercises routinely during rehab and add in resistance training at home;Long Term: Improve cardiorespiratory fitness, muscular endurance and strength as measured by increased METs and functional capacity ( )       Able to understand and use rate of perceived exertion (RPE) scale Yes       Intervention Provide education and explanation on how to use RPE scale       Expected Outcomes Short Term: Able to use RPE daily in rehab to express subjective intensity level;Long Term:  Able to use RPE to guide intensity level when exercising independently       Able to understand and use Dyspnea scale Yes       Intervention Provide education and explanation on how to use Dyspnea scale       Expected Outcomes Short Term: Able to use Dyspnea scale daily in rehab to express subjective sense of shortness of breath during exertion;Long Term: Able to use Dyspnea scale to guide intensity level when exercising independently       Knowledge and understanding of Target Heart Rate Range (THRR) Yes       Intervention Provide education and explanation of THRR including how the numbers were predicted and where they are located for reference       Expected Outcomes Short Term: Able to state/look up THRR;Short Term: Able to use daily as guideline for intensity in rehab;Long Term: Able to use THRR to govern intensity when exercising  independently       Understanding of Exercise Prescription Yes       Intervention Provide education, explanation, and written materials on patient's individual exercise prescription       Expected Outcomes Short Term: Able to explain program exercise prescription;Long Term: Able to explain home exercise prescription to exercise independently          Exercise Goals Re-Evaluation:  Exercise Goals Re-Evaluation     Row Name 11/23/23 1634 12/17/23 1058 01/20/24 0827         Exercise Goal Re-Evaluation   Exercise Goals Review Increase Physical Activity;Able to understand and use Dyspnea scale;Understanding of Exercise Prescription;Increase  Strength and Stamina;Knowledge and understanding of Target Heart Rate Range (THRR);Able to understand and use rate of perceived exertion (RPE) scale Increase Physical Activity;Able to understand and use Dyspnea scale;Understanding of Exercise Prescription;Increase Strength and Stamina;Knowledge and understanding of Target Heart Rate Range (THRR);Able to understand and use rate of perceived exertion (RPE) scale Increase Physical Activity;Able to understand and use Dyspnea scale;Understanding of Exercise Prescription;Increase Strength and Stamina;Knowledge and understanding of Target Heart Rate Range (THRR);Able to understand and use rate of perceived exertion (RPE) scale     Comments Raiven has completed 4 exercise sessions. She exercises for 15 min on the track and Nustep. Gorgeous averages 2.38 METs on the track and 1.9 METs at level 2 on the Nustep. She performs the warmup and cooldown standing without limitations. Will progress Kileigh to treadmill walking. It is too soon to notate any other progressions. Will continue to monitor and progress as able. Porfiria has completed 10 exercise sessions. She exercises for 15 min on the treadmill and Nustep. Cesily averages 1.92 METs at 1.2 mph on the treadmill and 1.8 METs at level 3 on the Nustep. She performs the warmup and  cooldown standing without limitations. She has progressed to treadmill walking. She tolerates the treadmill fair. Her level on the Nustep has also increased with no change in METs. Will continue to monitor and progress as able. Dayla has completed 17 exercise sessions. She exercises for 15 min on the treadmill and Nustep. Statia averages 2 METs at 1.2 mph  and 1% incline on the treadmill and 1.8 METs at level 4 on the Nustep. She performs the warmup and cooldown standing without limitations. Stepheni has increased her incline on the treadmill and level on the Nustep. Her METs have only increased on the treadmill. She remains motivated to exercise. Will continue to monitor and progress as able.     Expected Outcomes Through exercise at rehab and home, the patient will decrease shortness of breath with daily activities and feel confident in carrying out an exercise regimen at home. Through exercise at rehab and home, the patient will decrease shortness of breath with daily activities and feel confident in carrying out an exercise regimen at home. Through exercise at rehab and home, the patient will decrease shortness of breath with daily activities and feel confident in carrying out an exercise regimen at home.        Nutrition & Weight - Outcomes:    Nutrition:  Nutrition Therapy & Goals - 02/03/24 1139       Nutrition Therapy   Diet Regular diet      Personal Nutrition Goals   Nutrition Goal Patient to improve diet quality by using the plate method as a guide for meal planning to include lean protein/plant protein, fruits, vegetables, whole grains, nonfat dairy as part of a well-balanced diet.    Comments Patient with medical history significant for pulmonary hypertension, heart failure, morbid obesity. Pt expresses desire to increase intake of high magnesium  foods due to history of hypomagnesemia; most recent serum Mg 1.7 on 12/07/23. Pt also reports motivation to make healthy dietary changes such  as avoiding fried foods.      Intervention Plan   Intervention Prescribe, educate and counsel regarding individualized specific dietary modifications aiming towards targeted core components such as weight, hypertension, lipid management, diabetes, heart failure and other comorbidities.;Nutrition handout(s) given to patient.   High Magnesium  Foods List; General Healthful Eating Nutrition Therapy handouts   Expected Outcomes Short Term Goal: Understand basic principles of  dietary content, such as calories, fat, sodium, cholesterol and nutrients.;Long Term Goal: Adherence to prescribed nutrition plan.          Nutrition Discharge:   Education Questionnaire Score:  Knowledge Questionnaire Score - 02/03/24 1221       Knowledge Questionnaire Score   Post Score 17/18          Goals reviewed with patient; copy given to patient.

## 2024-02-19 ENCOUNTER — Other Ambulatory Visit (HOSPITAL_COMMUNITY): Payer: Self-pay

## 2024-02-23 ENCOUNTER — Other Ambulatory Visit (HOSPITAL_COMMUNITY): Payer: Self-pay

## 2024-03-03 ENCOUNTER — Encounter: Payer: Self-pay | Admitting: Licensed Clinical Social Worker

## 2024-03-03 ENCOUNTER — Telehealth: Payer: Self-pay | Admitting: Licensed Clinical Social Worker

## 2024-03-03 NOTE — Patient Instructions (Signed)
 [name] - I am sorry I was unable to reach you today for our scheduled appointment. I work with Georgina Speaks, FNP and am calling to support your healthcare needs. Please contact me at [phone] at your earliest convenience. I look forward to speaking with you soon.   Thank you,  Rolin Kerns, LCSW [place]  [facility], [facility] Clinical Social Worker Direct Dial: [phone]  Fax: [phone] Website: [email or web address] 5:02 PM

## 2024-03-07 ENCOUNTER — Other Ambulatory Visit (HOSPITAL_COMMUNITY): Payer: Self-pay

## 2024-03-13 ENCOUNTER — Telehealth

## 2024-03-17 ENCOUNTER — Other Ambulatory Visit (HOSPITAL_COMMUNITY): Payer: Self-pay

## 2024-03-17 MED ORDER — DOXYCYCLINE HYCLATE 100 MG PO CAPS
100.0000 mg | ORAL_CAPSULE | Freq: Two times a day (BID) | ORAL | 0 refills | Status: DC
  Filled 2024-03-17: qty 14, 7d supply, fill #0

## 2024-03-17 MED ORDER — PREDNISONE 20 MG PO TABS
20.0000 mg | ORAL_TABLET | Freq: Every day | ORAL | 0 refills | Status: DC
  Filled 2024-03-17: qty 5, 5d supply, fill #0

## 2024-03-31 ENCOUNTER — Other Ambulatory Visit: Payer: Self-pay | Admitting: Licensed Clinical Social Worker

## 2024-03-31 NOTE — Patient Outreach (Signed)
 Complex Care Management   Visit Note  03/31/2024  Name:  [name] MRN: [id number] DOB: 1965/09/20  Situation: Referral received for Complex Care Management related to Caregiver Stress I obtained verbal consent from Patient.  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   Menorrhagia    secondary to uterine fibroids   OSA (obstructive sleep apnea)    07/25/13 HST AHI 33/hr, severe hypoxemia O2 min 42% and 95% of the time <89%   PE (pulmonary embolism) 2014   bilateral   Prediabetes    Pulmonary artery hypertension (HCC)    Pulmonary nodule    (5mm on loeft lower lobe) found on CT scan July 2014, repeat scan Jan 2015 showed less than 4mm   Right ovarian cyst    noted 09/2012    S/P TAH (total abdominal hysterectomy) 06/07/2013   Sleep apnea    SOB (shortness of breath) on exertion    Stomach ulcer    Trichomoniasis    05/2011    Vitamin D  deficiency     Assessment: Patient Reported Symptoms:  Cognitive Cognitive Status: No symptoms reported, Alert and oriented to person, place, and time, Normal speech and language skills Cognitive/Intellectual Conditions Management [RPT]: None reported or documented in medical history or problem list   Health Maintenance Behaviors: Annual physical exam  Neurological Neurological Review of Symptoms: Not assessed    HEENT HEENT Symptoms Reported: Not assessed      Cardiovascular Cardiovascular Symptoms Reported: Not assessed    Respiratory Respiratory Symptoms Reported:  Not assesed    Endocrine Endocrine Symptoms Reported: Not assessed    Gastrointestinal Gastrointestinal Symptoms Reported: Not assessed      Genitourinary Genitourinary Symptoms Reported: Not assessed    Integumentary Integumentary Symptoms Reported: Not assessed    Musculoskeletal Musculoskelatal Symptoms Reviewed: Not assessed        Psychosocial Psychosocial Symptoms Reported: No symptoms reported Behavioral Management Strategies: Coping strategies, Counseling, Support system Behavioral Health Self-Management Outcome: 4 (good) Major Change/Loss/Stressor/Fears (CP): Denies Techniques to Cope with Loss/Stress/Change: Counseling, Diversional activities      03/31/2024    PHQ2-9 Depression Screening   Little interest or pleasure in doing things    Feeling down, depressed, or hopeless    PHQ-2 - Total Score    Trouble falling or staying asleep, or sleeping too much    Feeling tired or having little energy    Poor appetite or overeating     Feeling bad about yourself - or that you are a failure or have let yourself or your family down    Trouble concentrating on things, such as reading the newspaper or watching television    Moving or speaking so slowly that other people could have noticed.  Or the opposite - being so fidgety or restless that you have been moving around a lot more than usual    Thoughts that you would be better off  dead, or hurting yourself in some way    PHQ2-9 Total Score    If you checked off any problems, how difficult have these problems made it for you to do your work, take care of things at home, or get along with other people    Depression Interventions/Treatment      There were no vitals filed for this visit.    Medications Reviewed Today     Reviewed by Reeve Mallo D, LCSW (Social Worker) on 03/31/24 at 1038  Med List Status: <None>   Medication Order Taking? Sig Documenting Provider Last Dose Status Informant  acetaminophen  (TYLENOL ) 500 MG  tablet [id number]  Take 1,000 mg by mouth every 6 (six) hours as needed for moderate pain or headache.  [provider]  Active Self  albuterol  (ACCUNEB ) 1.25 MG/3ML nebulizer solution [id number]  Take 3 mLs (1.25 mg total) by nebulization every 6 (six) hours as needed for wheezing. Georgina Speaks, FNP  Active   albuterol  (PROAIR  HFA) 108 (90 Base) MCG/ACT inhaler [id number]  Inhale 1-2 puffs into the lungs every 6 (six) hours as needed for wheezing or shortness of breath. McLean-Scocuzza, Randine SAILOR, MD  Active   Azelastine  HCl 137 MCG/SPRAY SOLN [id number]  PLACE 1 SPRAY INTO BOTH NOSTRILS 2 (TWO) TIMES DAILY. USE IN EACH NOSTRIL AS DIRECTED Webb, Padonda B, FNP  Active   chlorpheniramine-HYDROcodone  (TUSSIONEX) 10-8 MG/5ML [id number]  Take 5 mLs by mouth every 12 (twelve) hours as needed for cough. Georgina Speaks, FNP  Active   clotrimazole -betamethasone  (LOTRISONE ) cream [id number]  Apply 1 application topically 2 (two) times daily. Prn leg dermatitis McLean-Scocuzza, Randine SAILOR, MD  Active   Colchicine  (MITIGARE ) 0.6 MG CAPS [id number]  Take 1 capsule by mouth as needed. As needed gout flare max # of pills 2 in 24 hours McLean-Scocuzza, Randine SAILOR, MD  Active   doxycycline  (VIBRAMYCIN ) 100 MG capsule [id number]  Take 1 capsule (100 mg total) by mouth 2 (two) times daily for 7 days   Active   fluconazole  (DIFLUCAN ) 150 MG tablet [id number]  Take 1 tablet (150 mg total) by mouth once. Then repeat dose in 3 (three) days.   Active   fluticasone  (FLONASE ) 50 MCG/ACT nasal spray [id number]  Place into both nostrils daily as needed for allergies or rhinitis. [provider]  Active Self  Fluticasone -Salmeterol (ADVAIR  HFA IN) [id number]  Inhale 1 puff into the lungs as needed. [provider]  Active   fluticasone -salmeterol (ADVAIR ) 250-50 MCG/ACT AEPB [id number]  Inhale 1 puff into the lungs 2 (two) times daily, in the morning and bedtime   Active   furosemide  (LASIX ) 40 MG tablet [id number]  Take 1  tablet (40 mg total) by mouth daily.   Active   heparin  lock flush 100 unit/mL [id number]   Odean Potts, MD  Active   hydrOXYzine  (ATARAX ) 25 MG tablet [id number]  Take 1 tablet (25 mg total) by mouth 3 (three) times daily as needed.   Active   ibuprofen  (ADVIL ) 800 MG tablet [id number]  Take 1 tablet (800 mg total) by mouth every 8 (eight) hours as needed. McLean-Scocuzza, Randine SAILOR, MD  Active   linaclotide  (LINZESS ) 145 MCG CAPS capsule [id number]  Take 1 capsule (145 mcg total) by mouth daily.   Active   linaclotide  (LINZESS ) 72 MCG capsule [id number]  Take 1 capsule (72 mcg total) by mouth daily before breakfast. Georgina Speaks, FNP  Active   loratadine  (CLARITIN ) 10 MG tablet [id number]  Take 1 tablet (10 mg total)  by mouth daily. McLean-Scocuzza, Randine SAILOR, MD  Active   Magnesium  Cl-Calcium  Carbonate (SLOW MAGNESIUM /CALCIUM ) 70-117 MG TBEC [id number]  Take 2 tablets by mouth in the morning and at bedtime. Gudena, Vinay, MD  Active Self  Magnesium  Oxide -Mg Supplement 200 MG CHEW [id number]  Chew 1 gummy by mouth daily. Lee, Jordan, NP  Active   metoprolol  tartrate (LOPRESSOR ) 25 MG tablet [id number]  TAKE 1 TABLET BY MOUTH TWICE A DAY McLean-Scocuzza, Randine SAILOR, MD  Active   miconazole  (MONISTAT  7) 2 % vaginal cream [id number]  Place 1 Applicatorful vaginally at bedtime.   Active   Multiple Vitamin (MULTIVITAMIN) tablet [id number]  Take 1 tablet by mouth daily. [provider]  Active Self  omeprazole  (PRILOSEC) 40 MG capsule [id number]  Take 40 mg by mouth as needed. [provider]  Active   ondansetron  (ZOFRAN -ODT) 4 MG disintegrating tablet [id number]  Place 2 tablets (8 mg total) on the tongue 2 (two) times daily as needed.   Active   OPSUMIT  10 MG TABS [id number]  Take 10 mg by mouth daily. [provider]  Active Self  ORENITRAM  0.25 MG TBCR [id number]  Take 0.5 mg by mouth.  Take 2 tablets (0.5 mg total) by mouth every eight (8) hours. Take along with 5 mg tablet and four 1 mg  tablets, total 8.5 mg every 8 hours [provider]  Active   ORENITRAM  1 MG TBCR [id number]  Take by mouth. [provider]  Active   ORENITRAM  5 MG TBCR [id number]  Take by mouth. [provider]  Active   Polyethyl Glycol-Propyl Glycol (SYSTANE OP) [id number]  Place 1 drop into both eyes 3 (three) times daily as needed (dry/irritated eyes.). [provider]  Active Self  Potassium Chloride  ER 20 MEQ TBCR [id number]  Take 1 tablet by mouth as needed. [provider]  Active   predniSONE  (DELTASONE ) 20 MG tablet [id number]  Take 1 tablet (20 mg total) by mouth daily for 5 days   Active     Discontinued 08/17/16 0953   rivaroxaban  (XARELTO ) 20 MG TABS tablet [id number]  Take 20 mg by mouth daily with supper. [provider]  Active Self  sildenafil  (REVATIO ) 20 MG tablet [id number]  Take 1-2 tablets (20-40 mg total) by mouth 3 (three) times daily. As of 07/03/20 taking 20 mg tid per [facility] pulm Dr. LEvarge McLean-Scocuzza, Randine SAILOR, MD  Active   sodium chloride  flush (NS) 0.9 % injection 10 mL [id number]   Odean Potts, MD  Active   spironolactone  (ALDACTONE ) 50 MG tablet [id number]  Take 1 tablet (50 mg total) by mouth daily. McLean-Scocuzza, Randine SAILOR, MD  Active   telmisartan  (MICARDIS ) 20 MG tablet [id number]  Take 1 tablet (20 mg total) by mouth daily. [place], [place], [place]  Active   tirzepatide  (MOUNJARO ) 7.5 MG/0.5ML Pen [id number]  Inject 7.5 mg into the skin once a week.   Active   torsemide  (DEMADEX ) 20 MG tablet [id number]  Take 20 mg by mouth daily as needed. [provider]  Active   triamcinolone  cream (KENALOG ) 0.1 % [id number]  Apply 1 application topically daily as needed (leg discoloration). McLean-Scocuzza, Randine SAILOR, MD  Active Self  Vitamin D , Ergocalciferol , (DRISDOL ) 1.25 MG (50000 UNIT) CAPS capsule [id number]  Take 1 capsule (50,000 Units total) by mouth every 7 (seven) days. *Need office visit for refills* Rayburn, Elouise Phlegm,  PA-C  Active   WINREVAIR 2 x 45 MG subcutaneous injection [id number]  Inject into the  skin. [provider]  Active             Recommendation:   Continue Current Plan of Care  Follow Up Plan:   Patient has met all care management goals. Care Management case will be closed. Patient has been provided contact information should new needs arise.   Rolin Ezzard HUGHS [facility]  [facility], [facility] Clinical Social Worker Direct Dial: [phone]  Fax: [phone] Website: [email or web address] 10:41 AM

## 2024-03-31 NOTE — Patient Instructions (Signed)
 Visit Information  Thank you for taking time to visit with me today. Please don't hesitate to contact me if I can be of assistance to you before our next scheduled appointment.  Patient has met all care management goals. Care Management case will be closed. Patient has been provided contact information should new needs arise.   Please call the care guide team at [phone] if you need to cancel, schedule, or reschedule an appointment.   Please call the Suicide and Crisis Lifeline: 988 go to [facility]gent [facility] [address], [place] [phone]) call 911 if you are experiencing a Mental Health or Behavioral Health Crisis or need someone to talk to.  Rolin Kerns, LCSW [place]  [facility], [facility] Clinical Social Worker Direct Dial: [phone]  Fax: [phone] Website: [email or web address] 10:41 AM

## 2024-04-07 ENCOUNTER — Telehealth (HOSPITAL_COMMUNITY): Payer: Self-pay | Admitting: Cardiology

## 2024-04-07 ENCOUNTER — Other Ambulatory Visit: Payer: Self-pay

## 2024-04-07 NOTE — Patient Outreach (Signed)
 Complex Care Management   Visit Note  04/07/2024  Name:  [name] MRN: [id number] DOB: 14-Mar-1966  Situation: Referral received for Complex Care Management related to  Hypertensive heart disease with chronic diastolic congestive heart failure; type Diabetes with Microalbuminuria without long term current use of insulin , Pulmonary Hypertension, Situational Depression. I obtained verbal consent from Patient.  Visit completed with Patient on the phone.  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   Menorrhagia    secondary to uterine fibroids   OSA (obstructive sleep apnea)    07/25/13 HST AHI 33/hr, severe hypoxemia O2 min 42% and 95% of the time <89%   PE (pulmonary embolism) 2014   bilateral   Prediabetes    Pulmonary artery hypertension (HCC)    Pulmonary nodule    (5mm on loeft lower lobe) found on CT scan July 2014, repeat scan Jan 2015 showed less than 4mm   Right ovarian cyst    noted 09/2012    S/P TAH (total abdominal hysterectomy) 06/07/2013   Sleep apnea    SOB (shortness of breath) on exertion    Stomach ulcer    Trichomoniasis    05/2011    Vitamin D  deficiency     Assessment: Patient Reported Symptoms:  Cognitive Cognitive Status: Alert and oriented to person, place, and time, Normal speech and language skills Cognitive/Intellectual Conditions Management [RPT]: None reported or documented in medical history or problem list   Health Maintenance Behaviors: Annual physical exam  Neurological Neurological Review of Symptoms: No symptoms  reported    HEENT HEENT Symptoms Reported: No symptoms reported      Cardiovascular Cardiovascular Symptoms Reported: Swelling in legs or feet, Other: Other Cardiovascular Symptoms: abdominal distention Does patient have uncontrolled Hypertension?: No Cardiovascular Management Strategies: Adequate rest, Medication therapy, Routine screening Cardiovascular Self-Management Outcome: 3 (uncertain) Cardiovascular Comment: placed outbound [facility] to [facility] to report CHF symptoms  Respiratory Respiratory Symptoms Reported: Shortness of breath Respiratory Management Strategies: Oxygen  therapy, Routine screening, Medication therapy, Adequate rest Respiratory Self-Management Outcome: 3 (uncertain)  Endocrine Endocrine Symptoms Reported: Not assessed    Gastrointestinal Gastrointestinal Symptoms Reported: Distention Other Gastrointestinal Symptoms: related to CHF Gastrointestinal Management Strategies:  (routine screening) Gastrointestinal Self-Management Outcome: 3 (uncertain)    Genitourinary Genitourinary Symptoms Reported: Not assessed    Integumentary Integumentary Symptoms Reported: Not assessed    Musculoskeletal Musculoskelatal Symptoms Reviewed: Weakness Musculoskeletal Management Strategies: Adequate rest Musculoskeletal Self-Management Outcome: 3 (uncertain)      Psychosocial Psychosocial Symptoms Reported: No symptoms reported   Major Change/Loss/Stressor/Fears (CP): Medical condition, self Techniques to Cope with Loss/Stress/Change: Diversional activities Quality of Family Relationships: supportive Do you feel physically threatened by others?:  (unable to assess)    04/07/2024    PHQ2-9 Depression Screening   [name] interest or pleasure in doing things    Feeling down, depressed, or hopeless    PHQ-2 - Total Score    Trouble falling or staying asleep, or  sleeping too much    Feeling tired or having [name] energy    Poor appetite or overeating      Feeling bad about yourself - or that you are a failure or have let yourself or your family down    Trouble concentrating on things, such as reading the newspaper or watching television    Moving or speaking so slowly that other people could have noticed.  Or the opposite - being so fidgety or restless that you have been moving around a lot more than usual    Thoughts that you would be better off dead, or hurting yourself in some way    PHQ2-9 Total Score    If you checked off any problems, how difficult have these problems made it for you to do your work, take care of things at home, or get along with other people    Depression Interventions/Treatment      There were no vitals filed for this visit. Pain Scale: Not given for pain  Medications Reviewed Today     Reviewed by Morgan Clayborne CROME, RN (Registered Nurse) on 04/07/24 at 1209  Med List Status: <None>   Medication Order Taking? Sig Documenting Provider Last Dose Status Informant  acetaminophen  (TYLENOL ) 500 MG tablet [id number]  Take 1,000 mg by mouth every 6 (six) hours as needed for moderate pain or headache.  [provider]  Active Self  albuterol  (ACCUNEB ) 1.25 MG/3ML nebulizer solution [id number]  Take 3 mLs (1.25 mg total) by nebulization every 6 (six) hours as needed for wheezing. Georgina Speaks, FNP  Active   albuterol  (PROAIR  HFA) 108 [id number] Base) MCG/ACT inhaler [id number]  Inhale 1-2 puffs into the lungs every 6 (six) hours as needed for wheezing or shortness of breath. McLean-Scocuzza, Randine SAILOR, MD  Active   Azelastine  HCl 137 MCG/SPRAY SOLN [id number]  PLACE 1 SPRAY INTO BOTH NOSTRILS 2 (TWO) TIMES DAILY. USE IN EACH NOSTRIL AS DIRECTED Webb, Padonda B, FNP  Active   chlorpheniramine-HYDROcodone  (TUSSIONEX) 10-8 MG/5ML [id number]  Take 5 mLs by mouth every 12 (twelve) hours as needed for cough. Georgina Speaks, FNP  Active   clotrimazole -betamethasone  (LOTRISONE ) cream [id number]  Apply 1 application topically 2 (two) times  daily. Prn leg dermatitis McLean-Scocuzza, Randine SAILOR, MD  Active   Colchicine  (MITIGARE ) 0.6 MG CAPS [id number]  Take 1 capsule by mouth as needed. As needed gout flare max # of pills 2 in 24 hours McLean-Scocuzza, Randine SAILOR, MD  Active   doxycycline  (VIBRAMYCIN ) 100 MG capsule [id number]  Take 1 capsule (100 mg total) by mouth 2 (two) times daily for 7 days   Active   fluconazole  (DIFLUCAN ) 150 MG tablet [id number]  Take 1 tablet (150 mg total) by mouth once. Then repeat dose in 3 (three) days.   Active   fluticasone  (FLONASE ) 50 MCG/ACT nasal spray [id number]  Place into both nostrils daily as needed for allergies or rhinitis. [provider]  Active Self  Fluticasone -Salmeterol (ADVAIR  HFA IN) [id number]  Inhale 1 puff into the lungs as needed. [provider]  Active   fluticasone -salmeterol (ADVAIR ) 250-50 MCG/ACT AEPB [id number]  Inhale 1 puff into the lungs 2 (two) times daily, in the morning and bedtime   Active   furosemide  (LASIX ) 40 MG tablet [id number]  Take 1 tablet (40 mg total) by mouth daily.   Active   heparin  lock flush 100 unit/mL [id number]   Odean Potts, MD  Active   hydrOXYzine  (ATARAX ) 25 MG tablet [id number]  Take 1  tablet (25 mg total) by mouth 3 (three) times daily as needed.   Active   ibuprofen  (ADVIL ) 800 MG tablet [id number]  Take 1 tablet (800 mg total) by mouth every 8 (eight) hours as needed. McLean-Scocuzza, Randine SAILOR, MD  Active   linaclotide  (LINZESS ) 145 MCG CAPS capsule [id number]  Take 1 capsule (145 mcg total) by mouth daily.   Active   linaclotide  (LINZESS ) 72 MCG capsule [id number]  Take 1 capsule (72 mcg total) by mouth daily before breakfast. Georgina Speaks, FNP  Active   loratadine  (CLARITIN ) 10 MG tablet [id number]  Take 1 tablet (10 mg total) by mouth daily. McLean-Scocuzza, Randine SAILOR, MD  Active   Magnesium  Cl-Calcium  Carbonate (SLOW MAGNESIUM /CALCIUM ) 70-117 MG TBEC [id number]  Take 2 tablets by mouth in the morning and at bedtime. Gudena, Vinay, MD  Active  Self  Magnesium  Oxide -Mg Supplement 200 MG CHEW [id number]  Chew 1 gummy by mouth daily. Lee, Jordan, NP  Active   metoprolol  tartrate (LOPRESSOR ) 25 MG tablet [id number]  TAKE 1 TABLET BY MOUTH TWICE A DAY McLean-Scocuzza, Randine SAILOR, MD  Active   miconazole  (MONISTAT  7) 2 % vaginal cream [id number]  Place 1 Applicatorful vaginally at bedtime.   Active   Multiple Vitamin (MULTIVITAMIN) tablet [id number]  Take 1 tablet by mouth daily. [provider]  Active Self  omeprazole  (PRILOSEC) 40 MG capsule [id number]  Take 40 mg by mouth as needed. [provider]  Active   ondansetron  (ZOFRAN -ODT) 4 MG disintegrating tablet [id number]  Place 2 tablets (8 mg total) on the tongue 2 (two) times daily as needed.   Active   OPSUMIT  10 MG TABS [id number]  Take 10 mg by mouth daily. [provider]  Active Self  ORENITRAM  0.25 MG TBCR [id number]  Take 0.5 mg by mouth.  Take 2 tablets (0.5 mg total) by mouth every eight (8) hours. Take along with 5 mg tablet and four 1 mg tablets, total 8.5 mg every 8 hours [provider]  Active   ORENITRAM  1 MG TBCR [id number]  Take by mouth. [provider]  Active   ORENITRAM  5 MG TBCR [id number]  Take by mouth. [provider]  Active   Polyethyl Glycol-Propyl Glycol (SYSTANE OP) [id number]  Place 1 drop into both eyes 3 (three) times daily as needed (dry/irritated eyes.). [provider]  Active Self  Potassium Chloride  ER 20 MEQ TBCR [id number]  Take 1 tablet by mouth as needed. [provider]  Active   predniSONE  (DELTASONE ) 20 MG tablet [id number]  Take 1 tablet (20 mg total) by mouth daily for 5 days   Active     Discontinued 08/17/16 0953   rivaroxaban  (XARELTO ) 20 MG TABS tablet [id number]  Take 20 mg by mouth daily with supper. [provider]  Active Self  sildenafil  (REVATIO ) 20 MG tablet [id number]  Take 1-2 tablets (20-40 mg total) by mouth 3 (three) times daily. As of 07/03/20 taking 20 mg tid per  [facility] pulm Dr. LEvarge McLean-Scocuzza, Randine SAILOR, MD  Active   sodium chloride  flush (NS) 0.9 % injection 10 mL [id number]   Odean Potts, MD  Active   spironolactone  (ALDACTONE ) 50 MG tablet [id number]  Take 1 tablet (50 mg total) by mouth daily. McLean-Scocuzza, Randine SAILOR, MD  Active   telmisartan  (MICARDIS ) 20 MG tablet [id number]  Take 1 tablet (20 mg total) by mouth daily. [place], [place]  Active   tirzepatide  (MOUNJARO ) 7.5 MG/0.5ML Pen [id number]  Inject 7.5 mg  into the skin once a week.   Active   torsemide  (DEMADEX ) 20 MG tablet [id number]  Take 20 mg by mouth daily as needed. [provider]  Active   triamcinolone  cream (KENALOG ) 0.1 % [id number]  Apply 1 application topically daily as needed (leg discoloration). McLean-Scocuzza, Randine SAILOR, MD  Active Self  Vitamin D , Ergocalciferol , (DRISDOL ) 1.25 MG (50000 UNIT) CAPS capsule [id number]  Take 1 capsule (50,000 Units total) by mouth every 7 (seven) days. *Need office visit for refills* Rayburn, Elouise Phlegm, PA-C  Active   WINREVAIR 2 x 45 MG subcutaneous injection [id number]  Inject into the skin. [provider]  Active             Recommendation:   Specialty provider follow-up   04/12/2024 Status: Sch   Time: 12:00 PM Length: 30  Visit Type: EST CHF 30 [1706] Copay: $0.00  Provider: Hayes Beckey CROME, NP Department: [facility]    Follow Up Plan:   Closing From:  Complex Care Management  Clayborne Ly RN BSN CCM [facility]  [facility], [facility] Nurse Care Coordinator  Direct Dial: [phone] Website: [email or web address]

## 2024-04-07 NOTE — Patient Instructions (Signed)
 Visit Information  Thank you for taking time to visit with me today.    Please call 1-800-273-TALK (toll free, 24 hour hotline) if you are experiencing a Mental Health or Behavioral Health Crisis or need someone to talk to.  Clayborne Ly RN BSN CCM [place]  [facility], [facility] Nurse Care Coordinator  Direct Dial: [phone] Website: [email or web address]

## 2024-04-07 NOTE — Telephone Encounter (Signed)
 [name] with [facility] case management  called to report HF symptoms  Weight today  235-was 238 Increase in SOB Unable to perform normal activity  Abdominal distention  Not taking diuretics   Reports Side effect while taking torsemide -muscle spasms Reports lasix  does not help so she is not taking   Add on appt made with pt 1/12  Advised would forward to provider if medication adjustments are needed before follow up

## 2024-04-10 ENCOUNTER — Other Ambulatory Visit (HOSPITAL_COMMUNITY): Payer: Self-pay

## 2024-04-11 ENCOUNTER — Telehealth (HOSPITAL_COMMUNITY): Payer: Self-pay | Admitting: Internal Medicine

## 2024-04-11 NOTE — Telephone Encounter (Signed)
 Called to confirm/remind patient of their appointment at the [facility] on 04/11/24.   Appointment:   [x] Confirmed  [] Left mess   [] No answer/No voice mail  [] VM Full/unable to leave message  [] Phone not in service  Patient reminded to bring all medications and/or complete list.  Confirmed patient has transportation. Gave directions, instructed to utilize valet parking.

## 2024-04-11 NOTE — Telephone Encounter (Signed)
 Pt aware and voiced understanding  -reports she cannot take torsemide  reports this medication causes severe muscle cramps Reports she does take KCL with meds   Reports she will sometimes take extra KCL and spasms are still present  Reports she will discuss with provider at follow up 1/14

## 2024-04-12 ENCOUNTER — Other Ambulatory Visit (HOSPITAL_COMMUNITY): Payer: Self-pay

## 2024-04-12 ENCOUNTER — Ambulatory Visit (HOSPITAL_COMMUNITY)
Admission: RE | Admit: 2024-04-12 | Discharge: 2024-04-12 | Disposition: A | Discharge status: Home / Self Care | Source: Ambulatory Visit | Attending: Internal Medicine | Admitting: Internal Medicine

## 2024-04-12 ENCOUNTER — Other Ambulatory Visit: Payer: Self-pay

## 2024-04-12 ENCOUNTER — Encounter (HOSPITAL_COMMUNITY): Payer: Self-pay | Admitting: Internal Medicine

## 2024-04-12 ENCOUNTER — Telehealth (HOSPITAL_COMMUNITY): Payer: Self-pay

## 2024-04-12 VITALS — BP 142/86 | HR 92 | Wt 264.6 lb

## 2024-04-12 DIAGNOSIS — I110 Hypertensive heart disease with heart failure: Secondary | ICD-10-CM | POA: Diagnosis not present

## 2024-04-12 DIAGNOSIS — G4733 Obstructive sleep apnea (adult) (pediatric): Secondary | ICD-10-CM | POA: Diagnosis not present

## 2024-04-12 DIAGNOSIS — K766 Portal hypertension: Secondary | ICD-10-CM | POA: Diagnosis not present

## 2024-04-12 DIAGNOSIS — Z87891 Personal history of nicotine dependence: Secondary | ICD-10-CM | POA: Diagnosis not present

## 2024-04-12 DIAGNOSIS — Z6841 Body Mass Index (BMI) 40.0 and over, adult: Secondary | ICD-10-CM | POA: Diagnosis not present

## 2024-04-12 DIAGNOSIS — Z9189 Other specified personal risk factors, not elsewhere classified: Secondary | ICD-10-CM

## 2024-04-12 DIAGNOSIS — Z853 Personal history of malignant neoplasm of breast: Secondary | ICD-10-CM | POA: Diagnosis not present

## 2024-04-12 DIAGNOSIS — Z86711 Personal history of pulmonary embolism: Secondary | ICD-10-CM | POA: Insufficient documentation

## 2024-04-12 DIAGNOSIS — I50810 Right heart failure, unspecified: Secondary | ICD-10-CM | POA: Diagnosis not present

## 2024-04-12 DIAGNOSIS — I2721 Secondary pulmonary arterial hypertension: Secondary | ICD-10-CM | POA: Insufficient documentation

## 2024-04-12 DIAGNOSIS — Z7901 Long term (current) use of anticoagulants: Secondary | ICD-10-CM | POA: Diagnosis not present

## 2024-04-12 DIAGNOSIS — I493 Ventricular premature depolarization: Secondary | ICD-10-CM | POA: Insufficient documentation

## 2024-04-12 DIAGNOSIS — I10 Essential (primary) hypertension: Secondary | ICD-10-CM

## 2024-04-12 DIAGNOSIS — Z9981 Dependence on supplemental oxygen: Secondary | ICD-10-CM | POA: Diagnosis not present

## 2024-04-12 DIAGNOSIS — Z79899 Other long term (current) drug therapy: Secondary | ICD-10-CM | POA: Insufficient documentation

## 2024-04-12 LAB — BASIC METABOLIC PANEL WITH GFR
Anion gap: 10 (ref 5–15)
BUN: 15 mg/dL (ref 6–20)
CO2: 27 mmol/L (ref 22–32)
Calcium: 9.2 mg/dL (ref 8.9–10.3)
Chloride: 106 mmol/L (ref 98–111)
Creatinine, Ser: 0.88 mg/dL (ref 0.44–1.00)
GFR, Estimated: >60 mL/min
Glucose, Bld: 143 mg/dL — ABNORMAL HIGH (ref 70–99)
Potassium: 3.8 mmol/L (ref 3.5–5.1)
Sodium: 143 mmol/L (ref 135–145)

## 2024-04-12 LAB — MAGNESIUM: Magnesium: 1.3 mg/dL — ABNORMAL LOW (ref 1.7–2.4)

## 2024-04-12 LAB — PRO BRAIN NATRIURETIC PEPTIDE: Pro Brain Natriuretic Peptide: 89.9 pg/mL

## 2024-04-12 MED ORDER — FUROSEMIDE 40 MG PO TABS
80.0000 mg | ORAL_TABLET | Freq: Every day | ORAL | 2 refills | Status: DC | PRN
  Filled 2024-04-12: qty 60, 30d supply, fill #0

## 2024-04-12 MED ORDER — POTASSIUM CHLORIDE ER 20 MEQ PO TBCR
40.0000 meq | EXTENDED_RELEASE_TABLET | ORAL | 3 refills | Status: DC | PRN
  Filled 2024-04-12: qty 180, 90d supply, fill #0
  Filled 2024-04-12: qty 60, 30d supply, fill #0
  Filled 2024-04-12: qty 180, 90d supply, fill #0
  Filled 2024-04-12: qty 60, 30d supply, fill #0

## 2024-04-12 MED ORDER — TELMISARTAN 20 MG PO TABS
20.0000 mg | ORAL_TABLET | Freq: Every day | ORAL | 3 refills | Status: AC
  Filled 2024-04-12 (×2): qty 90, 90d supply, fill #0

## 2024-04-12 NOTE — Progress Notes (Signed)
 "  ADVANCED HF CLINIC NOTE  Primary Care: Georgina Speaks, FNP Primary Cardiologist: Dr. Lonni HF Cardioliogist: Dr. Cherrie  HPI: [name] is a 59 y.o. SABRAfemale with morbid obesity, pulmonary hypertension due iPAH (followed by Dr. Janie at [facility]), RV failure, OSA/OHS on CPAP, breast CA, previous PE.  She is followed closely with Dr. Janie since 2017 for severe IPAH. Initially started on on Remodulin  pump, sildenafil  40 tid, macintentan 10 and sotatercept.  Echo 8/22 LVEF 60-65% RV moderately dilated and HK. TR jet inadequate to estimate RVSP.   Lost to follow up in AHF clinic.  [facility] Cardiac Studies Reviewed: Echo (6/25): EF 55% with mild/mod reduced RV, mild worsening of RV from prior 1/25 RHC (2/25): RA 2, PA 40/22 (29), PCW 5, CO/CI 5.2/2.5 (td) and 5.1/2.4 (f), PA sat 65%, PVR 4.6 WU  RHC (4/23): RHC 06/2021: RA 8, PA 64/30 (41), PCW 12, CO/CI 6.6/3.0 (td) and 5.6/2.6 (f) PA sat 61%, PVR 4.4 (td)   Follow up 6/25 with Dr. Janie, had been transisioned from IV Treprostinil  to PO 3/25. However, has been struggling with GI side effects and dose has been needing titration down. Titrated down again to 8.5 mg tid at last visit. Had been doing well from a cardiopulmonary standpoint.   Seen in AHF 11/04/23, had atypical CP & cramping she attributed to electrolyte disturbance. BP elevated. Zio placed for frequent PVCs.  Zio 8/25 showed mostly NSR, 2 runs of VT, 241 runs SVT, 4.4% PVC burden.  Today she returns for AHF follow up after stopping Torsemide  as it causes muscle cramps. Overall feeling ok just volume overloaded for the last 2 weeks. Denies palpitations, CP, dizziness, or PND/Orthopnea. Has noticed BLE edema and abd fullness. SOB with minimal activity. Appetite ok, watches what she eats. Drinks <64 oz fluid/day. No fever or chills. Weight at home trending up, has gained ~5lbs in last week. Taking all medications. Denies ETOH, tobacco or drug use.   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   Menorrhagia    secondary to uterine fibroids   OSA (obstructive sleep apnea)    07/25/13 HST AHI 33/hr, severe hypoxemia O2 min 42% and 95% of the time <89%   PE (pulmonary embolism) 2014   bilateral   Prediabetes    Pulmonary artery hypertension (HCC)    Pulmonary nodule    (5mm on loeft lower lobe) found on CT scan July 2014, repeat scan Jan 2015 showed less than 4mm   Right ovarian cyst    noted 09/2012    S/P TAH (total abdominal hysterectomy) 06/07/2013   Sleep apnea    SOB (shortness of breath) on exertion    Stomach ulcer    Trichomoniasis    05/2011    Vitamin D  deficiency     Current Outpatient Medications  Medication Sig Dispense Refill   acetaminophen  (TYLENOL ) 500 MG tablet Take 1,000 mg by mouth every 6 (six) hours as needed for moderate pain or  headache.      albuterol  (ACCUNEB ) 1.25 MG/3ML nebulizer solution Take 3 mLs (1.25 mg total) by nebulization every 6 (six) hours as needed for wheezing. 360 mL 12   albuterol  (PROAIR  HFA) 108 (90 Base) MCG/ACT inhaler Inhale 1-2 puffs into the lungs every 6 (six) hours as needed for wheezing or shortness of breath. 18 g 11   Azelastine  HCl 137 MCG/SPRAY SOLN PLACE 1 SPRAY INTO BOTH NOSTRILS 2 (TWO) TIMES DAILY. USE IN EACH NOSTRIL AS DIRECTED 30 mL 4   chlorpheniramine-HYDROcodone  (TUSSIONEX) 10-8 MG/5ML Take 5 mLs by mouth every 12 (twelve) hours as needed for cough. 115 mL 0   clotrimazole -betamethasone  (LOTRISONE ) cream Apply 1 application topically 2 (two) times daily. Prn leg dermatitis  45 g 0   Colchicine  (MITIGARE ) 0.6 MG CAPS Take 1 capsule by mouth as needed. As needed gout flare max # of pills 2 in 24 hours 30 capsule 11   doxycycline  (VIBRAMYCIN ) 100 MG capsule Take 1 capsule (100 mg total) by mouth 2 (two) times daily for 7 days 14 capsule 0   fluconazole  (DIFLUCAN ) 150 MG tablet Take 1 tablet (150 mg total) by mouth once. Then repeat dose in 3 (three) days. 2 tablet 0   fluticasone  (FLONASE ) 50 MCG/ACT nasal spray Place into both nostrils daily as needed for allergies or rhinitis.     fluticasone -salmeterol (ADVAIR ) 250-50 MCG/ACT AEPB Inhale 1 puff into the lungs 2 (two) times daily, in the morning and bedtime 60 each 11   hydrOXYzine  (ATARAX ) 25 MG tablet Take 1 tablet (25 mg total) by mouth 3 (three) times daily as needed. 90 tablet 1   ibuprofen  (ADVIL ) 800 MG tablet Take 1 tablet (800 mg total) by mouth every 8 (eight) hours as needed. 90 tablet 1   linaclotide  (LINZESS ) 72 MCG capsule Take 1 capsule (72 mcg total) by mouth daily before breakfast. 90 capsule 1   loratadine  (CLARITIN ) 10 MG tablet Take 1 tablet (10 mg total) by mouth daily. 90 tablet 3   Magnesium  Oxide -Mg Supplement 200 MG CHEW Chew 1 gummy by mouth daily. 30 tablet 3   metoprolol  tartrate (LOPRESSOR ) 25 MG tablet TAKE 1 TABLET BY MOUTH TWICE A DAY 180 tablet 3   miconazole  (MONISTAT  7) 2 % vaginal cream Place 1 Applicatorful vaginally at bedtime. 45 g 0   Multiple Vitamin (MULTIVITAMIN) tablet Take 1 tablet by mouth daily.     omeprazole  (PRILOSEC) 40 MG capsule Take 40 mg by mouth as needed.     ondansetron  (ZOFRAN -ODT) 4 MG disintegrating tablet Place 2 tablets (8 mg total) on the tongue 2 (two) times daily as needed. 120 tablet 3   OPSUMIT  10 MG TABS Take 10 mg by mouth daily.  8   ORENITRAM  0.25 MG TBCR Take 0.5 mg by mouth.  Take 2 tablets (0.5 mg total) by mouth every eight (8) hours. Take along with 5 mg tablet and four 1 mg tablets, total 8.5 mg every 8 hours     ORENITRAM  1 MG TBCR Take by  mouth.     Polyethyl Glycol-Propyl Glycol (SYSTANE OP) Place 1 drop into both eyes 3 (three) times daily as needed (dry/irritated eyes.).     Potassium Chloride  ER 20 MEQ TBCR Take 1 tablet by mouth as needed.     predniSONE  (DELTASONE ) 20 MG tablet Take 1 tablet (20 mg total) by mouth daily for 5 days 5 tablet 0   rivaroxaban  (XARELTO ) 20 MG TABS tablet Take 20 mg by mouth daily  with supper.     spironolactone  (ALDACTONE ) 50 MG tablet Take 1 tablet (50 mg total) by mouth daily. 90 tablet 3   telmisartan  (MICARDIS ) 20 MG tablet Take 1 tablet (20 mg total) by mouth daily. 30 tablet 11   tirzepatide  (MOUNJARO ) 7.5 MG/0.5ML Pen Inject 7.5 mg into the skin once a week. 6 mL 0   torsemide  (DEMADEX ) 20 MG tablet Take 20 mg by mouth daily as needed.     triamcinolone  cream (KENALOG ) 0.1 % Apply 1 application topically daily as needed (leg discoloration). 454 g 2   Vitamin D , Ergocalciferol , (DRISDOL ) 1.25 MG (50000 UNIT) CAPS capsule Take 1 capsule (50,000 Units total) by mouth every 7 (seven) days. *Need office visit for refills* 5 capsule 0   WINREVAIR 2 x 45 MG subcutaneous injection Inject into the skin.     Fluticasone -Salmeterol (ADVAIR  HFA IN) Inhale 1 puff into the lungs as needed.     furosemide  (LASIX ) 40 MG tablet Take 1 tablet (40 mg total) by mouth daily. 30 tablet 11   linaclotide  (LINZESS ) 145 MCG CAPS capsule Take 1 capsule (145 mcg total) by mouth daily. 30 capsule 3   Magnesium  Cl-Calcium  Carbonate (SLOW MAGNESIUM /CALCIUM ) 70-117 MG TBEC Take 2 tablets by mouth in the morning and at bedtime.     ORENITRAM  5 MG TBCR Take by mouth.     sildenafil  (REVATIO ) 20 MG tablet Take 1-2 tablets (20-40 mg total) by mouth 3 (three) times daily. As of 07/03/20 taking 20 mg tid per [facility] pul[facility] Dr. LEvarge 10 tablet 0   No current facility-administered medications for this encounter.   Facility-Administered Medications Ordered in Other Encounters  Medication Dose Route Frequency Provider Last Rate Last  Admin   heparin  lock flush 100 unit/mL  500 Units Intracatheter Once PRN Odean Potts, MD       sodium chloride  flush (NS) 0.9 % injection 10 mL  10 mL Intracatheter PRN Odean Potts, MD        Allergies  Allergen Reactions   Ampicillin Other (See Comments)    Severe abdominal pain, dizziness (penicillin is okay)   Amoxicillin Other (See Comments)    LIGHT-HEADED and CLOSE TO PASSING OUT  AND ABDOMINAL PAIN Has patient had a PCN reaction causing immediate rash, facial/tongue/throat swelling, SOB or lightheadedness with hypotension: No Has patient had a PCN reaction causing severe rash involving mucus membranes or skin necrosis: No Has patient had a PCN reaction that required hospitalization No Has patient had a PCN reaction occurring within the last 10 years: No If all of the above answers are NO, then may proceed with Cephalosporin use.   Bismuth -Containing Compounds    Indomethacin  Other (See Comments)    Gastro irritation   Nsaids     GI ulcers   Penicillins    Sulfasalazine    Metronidazole  Nausea And Vomiting   Sulfa Antibiotics Other (See Comments) and Rash    Very bad yeast infection      Social History   Socioeconomic History   Marital status: Single    Spouse name: Not on file   Number of children: 1   Years of education: Not on file   Highest education level: Some college, no degree  Occupational History   Occupation: Film/video Editor, pharmacy tech  Tobacco Use   Smoking status: Former    Current packs/day: 0.00    Average packs/day: 0.3 packs/day for 10.0 years (3.3 ttl pk-yrs)    Types: Cigarettes    Start date:  10/03/2001    Quit date: 10/04/2011    Years since quitting: 12.5   Smokeless tobacco: Never   Tobacco comments:    No longer smoking cigarettes  Vaping Use   Vaping status: Never Used  Substance and Sexual Activity   Alcohol use: Yes    Alcohol/week: 0.0 standard drinks of alcohol    Comment: social   Drug use: No   Sexual activity:  Not Currently  Other Topics Concern   Not on file  Social History Narrative   Single, lives alone    Lives in [place]    worked fish farm manager at [employer]    Occupation: Programmer, 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 [employer]   As of 2022 on disability due to pulm artery htn       Children: boys   Grew up in [place]   Social Drivers of Health   Tobacco Use: Medium Risk (01/03/2024)   Patient History    Smoking Tobacco Use: Former    Smokeless Tobacco Use: Never    Passive Exposure: Not on Actuary Strain: Low Risk (09/14/2023)   Overall Financial Resource Strain (CARDIA)    Difficulty of Paying Living Expenses: Not hard at all  Food Insecurity: No Food Insecurity (09/14/2023)   Epic    Worried About Programme Researcher, Broadcasting/film/video in the Last Year: Never true    Ran Out of Food in the Last Year: Never true  Transportation Needs: No Transportation Needs (09/14/2023)   Epic    Lack of Transportation (Medical): No    Lack of Transportation (Non-Medical): No  Physical Activity: Sufficiently Active (09/08/2023)   Exercise Vital Sign    Days of Exercise per Week: 3 days    Minutes of Exercise per Session: 60 min  Stress: Stress Concern Present (09/08/2023)   [employer] of Occupational Health - Occupational Stress Questionnaire    Feeling of Stress : To some extent  Social Connections: Moderately Integrated (09/08/2023)   Social Connection and Isolation Panel    Frequency of Communication with Friends and Family: More than three times a week    Frequency of Social Gatherings with Friends and Family: More than three times a week    Attends Religious Services: More than 4 times per year    Active Member of Clubs or Organizations: Yes    Attends Banker Meetings: More than 4 times per year    Marital Status: Never married  Intimate Partner Violence: Not At Risk (09/14/2023)   Epic    Fear of Current or Ex-Partner: No    Emotionally Abused: No    Physically Abused: No     Sexually Abused: No  Depression (PHQ2-9): Medium Risk (02/03/2024)   Depression (PHQ2-9)    PHQ-2 Score: 9  Alcohol Screen: Low Risk (09/08/2023)   Alcohol Screen    Last Alcohol Screening Score (AUDIT): 0  Housing: Unknown (09/14/2023)   Epic    Unable to Pay for Housing in the Last Year: No    Number of Times Moved in the Last Year: Not on file    Homeless in the Last Year: No  Utilities: Not At Risk (09/14/2023)   Epic    Threatened with loss of utilities: No  Health Literacy: Adequate Health Literacy (09/08/2023)   B1300 Health Literacy    Frequency of need for help with medical instructions: Never    Family History  Problem Relation Age of Onset   Hypertension Mother    Kidney disease  Mother    Heart disease Mother    Alcoholism Mother    Hypertension Father    Heart disease Father    Stroke Sister    Hypertension Brother    Breast cancer Maternal Grandmother        died at 64   Breast cancer Paternal Grandmother    Cancer Maternal Aunt        unknown form   Breast cancer Paternal Aunt    Breast cancer Paternal Aunt    Breast cancer Cousin        pat first cousin dx in her 36s   Colon cancer Other 36       MGMs brother   Cancer Other        breast ca in GM   Cancer Other        g uncle colon or stomach ca    Wt Readings from Last 3 Encounters:  04/12/24 120 kg (264 lb 9.6 oz)  02/03/24 116.2 kg (256 lb 2.8 oz)  01/20/24 116.2 kg (256 lb 2.8 oz)   BP (!) 142/86   Pulse 92   Wt 120 kg (264 lb 9.6 oz)   LMP 05/11/2013   SpO2 95% Comment: 1.5 liters of room oxygen .  BMI 44.03 kg/m   PHYSICAL EXAM: General:  chronically ill appearing. On 2L [place], walked into clinic.  Neck: JVD ~9 cm.  Cor: Regular rate & rhythm. No murmurs. Lungs: clear, diminished bases Extremities: +1 BLE edema  Neuro: alert & oriented x 3. Affect pleasant.   ASSESSMENT & PLAN: 1. 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 H/o PE but pulmonary angiography 2017 without chronic PE - RHC  2/25 with low filling pressures with slightly lower CI/CO and high PVR from previous (RA 2, PA 40/22 (29), PCW 5, CO/CI 5.2/2.5 (td) and 5.1/2.4 (f), PA sat 65%, PVR 4.6 w) - Echo 6/25: EF 55% with mild/mod reduced RV, mild worsening of RV from prior 1/25 - Stable NYHA IIIb. Mildly volume overloaded on exam. Weight up 8lbs from last visit.  - Does not want to stay on Torsemide  as 20 and even 10 mg doses made her cramp significantly. Wants to rechallenge Lasix . Reports worsening renal function with Bumex in the past. Will start lasix  80 mg daily PRN. She will take the next 3 days in a row. Increase KDUR to 40 mEq daily PRN with lasix .  - BMET/P-BNP and Mg today.  - Continue spiro 50 mg daily.  - Home O2 requirement 2-6L during day, BiPAP at night - Completed Pulm Rehab 02/17/24 - Followed closely at [facility] on therapy prev on IV treprostinil  (started 10/20). Now transitioned to PO Treprostinil  (3/25). Continues on sildenafil  40 tid, sotatercept, and macitentan  10 daily.  - Defer PAH management to Dr. Janie and [facility] team.   2. OSA - Continue O2 and BiPAP  3. Obesity - Body mass index is 44.03 kg/m. - On tirzepatide   4. PVCs - Zio 7/23 2 runs of SVT and NSVT. PVCs 2.4%  - Zio 8/25: mostly NSR, 4.4% PVC burden, runs of NSVT - Palpitations improved - Continue metoprolol  25 mg bid  5. HTN - BP slightly elevated today, reports from rushing to appt today as she was late. Stable at home.  - Continue telmisartan  20 mg daily  6. H/o VTE - H/o chronic PE. Likely due to lining stasis from [facility] as opposed to true CTEPH - Continue Xarelto  20 mg daily. No bleeding  issues.  Follow up in 2 weeks with APP to reassess volume with diuretic changes. Would be fine going back to annual f/u after that if stable.   Beckey LITTIE Coe, NP  12:46 PM  "

## 2024-04-12 NOTE — Patient Instructions (Signed)
 Medication Changes:  STOP TORSEMIDE    START FUROSEMIDE  80MG  DAILY FOR THE NEXT 3 DAYS--AND THEN REDUCE TO ONLY ONCE DAILY AS NEEDED FOR SWELLING OR WEIGHT GAIN   TAKE POTASSIUM 40MEQ DAILY FOR THE NEXT 3 DAYS, AND THEN ONLY TAKE IT WHEN YOU TAKE FUROSEMIDE    Lab Work:  Labs done today, your results will be available in MyChart, we will contact you for abnormal readings.  Follow-Up in: 2 WEEKS AS SCHEDULED   At the [facility], you and your health needs are our priority. We have a designated team specialized in the treatment of Heart Failure. This Care Team includes your primary Heart Failure Specialized Cardiologist (physician), Advanced Practice Providers (APPs- Physician Assistants and Nurse Practitioners), and Pharmacist who all work together to provide you with the care you need, when you need it.   You may see any of the following providers on your designated Care Team at your next follow up:  Dr. Toribio Fuel Dr. Ezra Shuck Dr. Odis Brownie Greig Mosses, NP Caffie Shed, [place] [facility] [place], [place] Beckey Coe, NP Jordan Lee, NP Tinnie Redman, PharmD   Please be sure to bring in all your medications bottles to every appointment.   Need to Contact Us :  If you have any questions or concerns before your next appointment please send us  a message through [place] or call our office at [phone].    TO LEAVE A MESSAGE FOR THE NURSE SELECT OPTION 2, PLEASE LEAVE A MESSAGE INCLUDING: YOUR NAME DATE OF BIRTH CALL BACK NUMBER REASON FOR CALL**this is important as we prioritize the call backs  YOU WILL RECEIVE A CALL BACK THE SAME DAY AS LONG AS YOU CALL BEFORE 4:00 PM

## 2024-04-12 NOTE — Telephone Encounter (Signed)
 Patient came in today an asked about a program we offer where someone comes in to check on her and call her she is unsure what the program is the only thing I can think of is paramedicine she is requesting aw call from a nurse with the options we have for her.

## 2024-04-13 ENCOUNTER — Telehealth (HOSPITAL_COMMUNITY): Payer: Self-pay | Admitting: Internal Medicine

## 2024-04-13 ENCOUNTER — Telehealth (HOSPITAL_COMMUNITY): Payer: Self-pay | Admitting: Licensed Clinical Social Worker

## 2024-04-13 ENCOUNTER — Other Ambulatory Visit (HOSPITAL_COMMUNITY): Payer: Self-pay

## 2024-04-13 ENCOUNTER — Ambulatory Visit (HOSPITAL_COMMUNITY): Payer: Self-pay | Admitting: Internal Medicine

## 2024-04-13 NOTE — Telephone Encounter (Signed)
 Patient referred to infusion pharmacy team for ambulatory infusion of Magnesium  Sulfate.  Insurance - Research Scientist (life Sciences) of care - Site of care: [facility] Dx code - [phone] Therapy - Provider authorized Magnesium  Sulfate 4 gm IV x 1 dose  Infusion appointments - Scheduling team will schedule patient as soon as possible.    Thank you,  Norton Blush, PharmD, [facility] Pharmacist [facility]

## 2024-04-13 NOTE — Telephone Encounter (Signed)
 H&V Care Navigation CSW Progress Note  Clinical Social Worker received message from clinic staff that patient was inquiring about Designer, Jewellery.  CSW spoke with provider to see if they have concerns regarding patient that would warrant paramedicine referral.  Provider feels as if patient has good understanding of medications and medical recommendations at this time and is able to manage her health well independently so does not feel as if this is appropriate for patient currently.  CSW called pt to discuss and she agrees that she feels capable of managing her health on her own.  No further needs at this time  Tiffany HILARIO Leech, LCSW Clinical Social Worker [facility] Desk#: [phone] Cell#: [phone]

## 2024-04-19 ENCOUNTER — Ambulatory Visit (HOSPITAL_COMMUNITY)
Admission: RE | Admit: 2024-04-19 | Discharge: 2024-04-19 | Disposition: A | Discharge status: Home / Self Care | Source: Ambulatory Visit | Attending: Internal Medicine | Admitting: Internal Medicine

## 2024-04-19 MED ORDER — MAGNESIUM SULFATE 4 GM/100ML IV SOLN
INTRAVENOUS | Status: AC
  Filled 2024-04-19: qty 100

## 2024-04-19 MED ORDER — MAGNESIUM SULFATE 4 GM/100ML IV SOLN
4.0000 g | Freq: Once | INTRAVENOUS | Status: AC
  Administered 2024-04-19: 4 g via INTRAVENOUS

## 2024-04-26 ENCOUNTER — Telehealth (HOSPITAL_COMMUNITY): Payer: Self-pay

## 2024-04-26 NOTE — Telephone Encounter (Signed)
 Called to confirm/remind patient of their appointment at the [facility] on 04/27/24. However, patient rescheduled for another day.

## 2024-04-27 ENCOUNTER — Ambulatory Visit (HOSPITAL_COMMUNITY)

## 2024-04-28 ENCOUNTER — Telehealth (HOSPITAL_COMMUNITY): Payer: Self-pay

## 2024-04-28 NOTE — Telephone Encounter (Signed)
 Called to confirm/remind patient of their appointment at the [facility] on 05/01/24.   Appointment:   [x] Confirmed  [] Left mess   [] No answer/No voice mail  [] VM Full/unable to leave message  [] Phone not in service  Patient reminded to bring all medications and/or complete list.  Confirmed patient has transportation. Gave directions, instructed to utilize valet parking.

## 2024-04-30 ENCOUNTER — Telehealth (HOSPITAL_COMMUNITY): Payer: Self-pay

## 2024-04-30 NOTE — Telephone Encounter (Signed)
 Called to confirm/remind patient of their appointment at the [facility] on 04/30/24.   Appointment:   [] Confirmed  [x] Left mess   [] No answer/No voice mail  [] VM Full/unable to leave message  [] Phone not in service

## 2024-05-01 ENCOUNTER — Ambulatory Visit (HOSPITAL_COMMUNITY)

## 2024-05-02 ENCOUNTER — Telehealth (HOSPITAL_COMMUNITY): Payer: Self-pay

## 2024-05-03 ENCOUNTER — Ambulatory Visit (HOSPITAL_COMMUNITY): Admission: RE | Admit: 2024-05-03 | Discharge: 2024-05-03 | Attending: Cardiology

## 2024-05-03 ENCOUNTER — Other Ambulatory Visit (HOSPITAL_COMMUNITY): Payer: Self-pay

## 2024-05-03 ENCOUNTER — Encounter (HOSPITAL_COMMUNITY): Payer: Self-pay

## 2024-05-03 VITALS — BP 127/91 | HR 82 | Wt 266.8 lb

## 2024-05-03 DIAGNOSIS — Z86711 Personal history of pulmonary embolism: Secondary | ICD-10-CM | POA: Insufficient documentation

## 2024-05-03 DIAGNOSIS — E66813 Obesity, class 3: Secondary | ICD-10-CM

## 2024-05-03 DIAGNOSIS — E662 Morbid (severe) obesity with alveolar hypoventilation: Secondary | ICD-10-CM | POA: Insufficient documentation

## 2024-05-03 DIAGNOSIS — I50810 Right heart failure, unspecified: Secondary | ICD-10-CM

## 2024-05-03 DIAGNOSIS — Z6841 Body Mass Index (BMI) 40.0 and over, adult: Secondary | ICD-10-CM | POA: Insufficient documentation

## 2024-05-03 DIAGNOSIS — I2721 Secondary pulmonary arterial hypertension: Secondary | ICD-10-CM

## 2024-05-03 DIAGNOSIS — G4733 Obstructive sleep apnea (adult) (pediatric): Secondary | ICD-10-CM

## 2024-05-03 DIAGNOSIS — I10 Essential (primary) hypertension: Secondary | ICD-10-CM

## 2024-05-03 DIAGNOSIS — I272 Pulmonary hypertension, unspecified: Secondary | ICD-10-CM | POA: Insufficient documentation

## 2024-05-03 DIAGNOSIS — I509 Heart failure, unspecified: Secondary | ICD-10-CM | POA: Insufficient documentation

## 2024-05-03 DIAGNOSIS — I493 Ventricular premature depolarization: Secondary | ICD-10-CM | POA: Diagnosis not present

## 2024-05-03 DIAGNOSIS — I110 Hypertensive heart disease with heart failure: Secondary | ICD-10-CM | POA: Insufficient documentation

## 2024-05-03 DIAGNOSIS — Z87891 Personal history of nicotine dependence: Secondary | ICD-10-CM | POA: Insufficient documentation

## 2024-05-03 DIAGNOSIS — Z79899 Other long term (current) drug therapy: Secondary | ICD-10-CM | POA: Insufficient documentation

## 2024-05-03 DIAGNOSIS — Z7901 Long term (current) use of anticoagulants: Secondary | ICD-10-CM | POA: Insufficient documentation

## 2024-05-03 DIAGNOSIS — Z853 Personal history of malignant neoplasm of breast: Secondary | ICD-10-CM | POA: Insufficient documentation

## 2024-05-03 DIAGNOSIS — Z7985 Long-term (current) use of injectable non-insulin antidiabetic drugs: Secondary | ICD-10-CM | POA: Insufficient documentation

## 2024-05-03 DIAGNOSIS — Z9981 Dependence on supplemental oxygen: Secondary | ICD-10-CM | POA: Insufficient documentation

## 2024-05-03 LAB — BASIC METABOLIC PANEL WITH GFR
Anion gap: 9 (ref 5–15)
BUN: 31 mg/dL — ABNORMAL HIGH (ref 6–20)
CO2: 28 mmol/L (ref 22–32)
Calcium: 9.9 mg/dL (ref 8.9–10.3)
Chloride: 102 mmol/L (ref 98–111)
Creatinine, Ser: 1.18 mg/dL — ABNORMAL HIGH (ref 0.44–1.00)
GFR, Estimated: 53 mL/min — ABNORMAL LOW
Glucose, Bld: 127 mg/dL — ABNORMAL HIGH (ref 70–99)
Potassium: 5.7 mmol/L — ABNORMAL HIGH (ref 3.5–5.1)
Sodium: 138 mmol/L (ref 135–145)

## 2024-05-03 LAB — AMYLASE: Amylase: 104 U/L — ABNORMAL HIGH (ref 28–100)

## 2024-05-03 LAB — PRO BRAIN NATRIURETIC PEPTIDE: Pro Brain Natriuretic Peptide: <50 pg/mL

## 2024-05-03 MED ORDER — FUROSEMIDE 40 MG PO TABS
40.0000 mg | ORAL_TABLET | Freq: Every day | ORAL | 2 refills | Status: AC
  Filled 2024-05-03 – 2024-05-05 (×2): qty 60, 60d supply, fill #0

## 2024-05-03 NOTE — Progress Notes (Signed)
 "  ADVANCED HF CLINIC NOTE  Primary Care: Georgina Speaks, FNP Primary Cardiologist: Dr. Lonni HF Cardioliogist: Dr. Cherrie  HPI: [name] is a 59 y.o. female with morbid obesity, pulmonary hypertension due iPAH (followed by Dr. Janie at [facility]), RV failure, OSA/OHS on CPAP, breast CA, previous PE.  She is followed closely with Dr. Janie since 2017 for severe IPAH. Initially started on on Remodulin  pump, sildenafil  40 tid, macintentan 10 and sotatercept.  Echo 8/22 LVEF 60-65% RV moderately dilated and HK. TR jet inadequate to estimate RVSP.   Lost to follow up in AHF clinic.  [facility] Cardiac Studies Reviewed: Echo (6/25): EF 55% with mild/mod reduced RV, mild worsening of RV from prior 1/25 RHC (2/25): RA 2, PA 40/22 (29), PCW 5, CO/CI 5.2/2.5 (td) and 5.1/2.4 (f), PA sat 65%, PVR 4.6 WU  RHC (4/23): RHC 06/2021: RA 8, PA 64/30 (41), PCW 12, CO/CI 6.6/3.0 (td) and 5.6/2.6 (f) PA sat 61%, PVR 4.4 (td)   Follow up 6/25 with Dr. Janie, had been transisioned from IV Treprostinil  to PO 3/25. However, has been struggling with GI side effects and dose has been needing titration down. Titrated down again to 8.5 mg tid at last visit. Had been doing well from a cardiopulmonary standpoint.   Seen in AHF 11/04/23, had atypical CP & cramping she attributed to electrolyte disturbance. BP elevated. Zio placed for frequent PVCs.  Zio 8/25 showed mostly NSR, 2 runs of VT, 241 runs SVT, 4.4% PVC burden.  She returns today for HF follow up. Overall feeling okay. Reports that she is still having cramping both in her right side and arms/legs. Initially lost wright with the lasix , however gained it back when she stopped and is now taking another 3 days of 80 mg. Dyspnea with activity. Denies chest pain, orthopnea, palpitations, and dizziness. Able to perform ADLs. Appetite okay. Reports compliance with all medications.  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   Menorrhagia    secondary to uterine fibroids   OSA (obstructive sleep apnea)    07/25/13 HST AHI 33/hr, severe hypoxemia O2 min 42% and 95% of the time <89%   PE (pulmonary embolism) 2014   bilateral   Prediabetes    Pulmonary artery hypertension (HCC)    Pulmonary nodule    (5mm on loeft lower lobe) found on CT scan July 2014, repeat scan Jan 2015 showed less than 4mm   Right ovarian cyst    noted 09/2012    S/P TAH (total abdominal hysterectomy) 06/07/2013   Sleep apnea    SOB (shortness of breath) on exertion    Stomach ulcer    Trichomoniasis    05/2011    Vitamin D  deficiency     Current Outpatient Medications  Medication Sig Dispense Refill   acetaminophen  (TYLENOL ) 500 MG tablet Take 1,000 mg by mouth every 6 (six) hours as needed for moderate pain or headache.      albuterol  (ACCUNEB ) 1.25 MG/3ML  nebulizer solution Take 3 mLs (1.25 mg total) by nebulization every 6 (six) hours as needed for wheezing. 360 mL 12   albuterol  (PROAIR  HFA) 108 (90 Base) MCG/ACT inhaler Inhale 1-2 puffs into the lungs every 6 (six) hours as needed for wheezing or shortness of breath. 18 g 11   Azelastine  HCl 137 MCG/SPRAY SOLN PLACE 1 SPRAY INTO BOTH NOSTRILS 2 (TWO) TIMES DAILY. USE IN EACH NOSTRIL AS DIRECTED 30 mL 4   chlorpheniramine-HYDROcodone  (TUSSIONEX) 10-8 MG/5ML Take 5 mLs by mouth every 12 (twelve) hours as needed for cough. 115 mL 0   clotrimazole -betamethasone  (LOTRISONE ) cream Apply 1 application topically 2 (two) times daily. Prn leg dermatitis 45 g 0   Colchicine  (MITIGARE ) 0.6 MG CAPS  Take 1 capsule by mouth as needed. As needed gout flare max # of pills 2 in 24 hours 30 capsule 11   fluticasone  (FLONASE ) 50 MCG/ACT nasal spray Place into both nostrils daily as needed for allergies or rhinitis.     fluticasone -salmeterol (ADVAIR ) 250-50 MCG/ACT AEPB Inhale 1 puff into the lungs 2 (two) times daily, in the morning and bedtime 60 each 11   hydrOXYzine  (ATARAX ) 25 MG tablet Take 1 tablet (25 mg total) by mouth 3 (three) times daily as needed. 90 tablet 1   ibuprofen  (ADVIL ) 800 MG tablet Take 1 tablet (800 mg total) by mouth every 8 (eight) hours as needed. 90 tablet 1   linaclotide  (LINZESS ) 145 MCG CAPS capsule Take 1 capsule (145 mcg total) by mouth daily. 30 capsule 3   linaclotide  (LINZESS ) 72 MCG capsule Take 1 capsule (72 mcg total) by mouth daily before breakfast. 90 capsule 1   loratadine  (CLARITIN ) 10 MG tablet Take 1 tablet (10 mg total) by mouth daily. 90 tablet 3   Magnesium  200 MG CHEW Chew 400 mg by mouth daily.     metoprolol  tartrate (LOPRESSOR ) 25 MG tablet TAKE 1 TABLET BY MOUTH TWICE A DAY 180 tablet 3   miconazole  (MONISTAT  7) 2 % vaginal cream Place 1 Applicatorful vaginally at bedtime. 45 g 0   Multiple Vitamin (MULTIVITAMIN) tablet Take 1 tablet by mouth daily.     omeprazole  (PRILOSEC) 40 MG capsule Take 40 mg by mouth as needed.     ondansetron  (ZOFRAN -ODT) 4 MG disintegrating tablet Place 2 tablets (8 mg total) on the tongue 2 (two) times daily as needed. 120 tablet 3   OPSUMIT  10 MG TABS Take 10 mg by mouth daily.  8   ORENITRAM  0.25 MG TBCR Take 0.5 mg by mouth.  Take 2 tablets (0.5 mg total) by mouth every eight (8) hours. Take along with 5 mg tablet and four 1 mg tablets, total 8.5 mg every 8 hours     ORENITRAM  1 MG TBCR Take by mouth.     Polyethyl Glycol-Propyl Glycol (SYSTANE OP) Place 1 drop into both eyes 3 (three) times daily as needed (dry/irritated eyes.).     Potassium Chloride  ER 20 MEQ TBCR Take 2 tablets (40 mEq total) by mouth as needed  (ONLY TAKE WHEN YOU TAKE FUROSEMIDE  DOSE). (Patient taking differently: Take 20-40 mEq by mouth as needed (ONLY TAKE WHEN YOU TAKE FUROSEMIDE  DOSE).) 60 tablet 3   rivaroxaban  (XARELTO ) 20 MG TABS tablet Take 20 mg by mouth daily with supper.     sildenafil  (REVATIO ) 20 MG tablet Take 1-2 tablets (20-40 mg total) by mouth 3 (three) times daily. As of 07/03/20 taking 20 mg tid per [facility] pul[facility] Dr. LEvarge 10 tablet 0  spironolactone  (ALDACTONE ) 50 MG tablet Take 1 tablet (50 mg total) by mouth daily. 90 tablet 3   telmisartan  (MICARDIS ) 20 MG tablet Take 1 tablet (20 mg total) by mouth daily. 90 tablet 3   tirzepatide  (MOUNJARO ) 7.5 MG/0.5ML Pen Inject 7.5 mg into the skin once a week. 6 mL 0   triamcinolone  cream (KENALOG ) 0.1 % Apply 1 application topically daily as needed (leg discoloration). 454 g 2   Vitamin D , Ergocalciferol , (DRISDOL ) 1.25 MG (50000 UNIT) CAPS capsule Take 1 capsule (50,000 Units total) by mouth every 7 (seven) days. *Need office visit for refills* 5 capsule 0   WINREVAIR 2 x 45 MG subcutaneous injection Inject into the skin.     furosemide  (LASIX ) 40 MG tablet Take 1 tablet (40 mg total) by mouth daily. May take an extra 40mg  once daily as needed for swelling or weight gain 60 tablet 2   No current facility-administered medications for this encounter.   Facility-Administered Medications Ordered in Other Encounters  Medication Dose Route Frequency Provider Last Rate Last Admin   heparin  lock flush 100 unit/mL  500 Units Intracatheter Once PRN Odean Potts, MD       sodium chloride  flush (NS) 0.9 % injection 10 mL  10 mL Intracatheter PRN Odean Potts, MD        Allergies  Allergen Reactions   Ampicillin Other (See Comments)    Severe abdominal pain, dizziness (penicillin is okay)   Amoxicillin Other (See Comments)    LIGHT-HEADED and CLOSE TO PASSING OUT  AND ABDOMINAL PAIN Has patient had a PCN reaction causing immediate rash, facial/tongue/throat swelling, SOB or  lightheadedness with hypotension: No Has patient had a PCN reaction causing severe rash involving mucus membranes or skin necrosis: No Has patient had a PCN reaction that required hospitalization No Has patient had a PCN reaction occurring within the last 10 years: No If all of the above answers are NO, then may proceed with Cephalosporin use.   Bismuth -Containing Compounds    Indomethacin  Other (See Comments)    Gastro irritation   Nsaids     GI ulcers   Penicillins    Sulfasalazine    Metronidazole  Nausea And Vomiting   Sulfa Antibiotics Other (See Comments) and Rash    Very bad yeast infection      Social History   Socioeconomic History   Marital status: Single    Spouse name: Not on file   Number of children: 1   Years of education: Not on file   Highest education level: Some college, no degree  Occupational History   Occupation: Film/video Editor, pharmacy tech  Tobacco Use   Smoking status: Former    Current packs/day: 0.00    Average packs/day: 0.3 packs/day for 10.0 years (3.3 ttl pk-yrs)    Types: Cigarettes    Start date: 10/03/2001    Quit date: 10/04/2011    Years since quitting: 12.5   Smokeless tobacco: Never   Tobacco comments:    No longer smoking cigarettes  Vaping Use   Vaping status: Never Used  Substance and Sexual Activity   Alcohol use: Yes    Alcohol/week: 0.0 standard drinks of alcohol    Comment: social   Drug use: No   Sexual activity: Not Currently  Other Topics Concern   Not on file  Social History Narrative   Single, lives alone    Lives in [place]    worked fish farm manager at [employer]    Occupation: MedTech at  [employer] and works [employer]   As of 2022 on disability due to pulm artery htn       Children: boys   Grew up in [place]   Social Drivers of Health   Tobacco Use: Medium Risk (05/03/2024)   Patient History    Smoking Tobacco Use: Former    Smokeless Tobacco Use: Never    Passive Exposure: Not on Surveyor, Minerals Strain: Low Risk (09/14/2023)   Overall Financial Resource Strain (CARDIA)    Difficulty of Paying Living Expenses: Not hard at all  Food Insecurity: No Food Insecurity (09/14/2023)   Epic    Worried About Programme Researcher, Broadcasting/film/video in the Last Year: Never true    Ran Out of Food in the Last Year: Never true  Transportation Needs: No Transportation Needs (09/14/2023)   Epic    Lack of Transportation (Medical): No    Lack of Transportation (Non-Medical): No  Physical Activity: Sufficiently Active (09/08/2023)   Exercise Vital Sign    Days of Exercise per Week: 3 days    Minutes of Exercise per Session: 60 min  Stress: Stress Concern Present (09/08/2023)   [employer] of Occupational Health - Occupational Stress Questionnaire    Feeling of Stress : To some extent  Social Connections: Moderately Integrated (09/08/2023)   Social Connection and Isolation Panel    Frequency of Communication with Friends and Family: More than three times a week    Frequency of Social Gatherings with Friends and Family: More than three times a week    Attends Religious Services: More than 4 times per year    Active Member of Clubs or Organizations: Yes    Attends Banker Meetings: More than 4 times per year    Marital Status: Never married  Intimate Partner Violence: Not At Risk (09/14/2023)   Epic    Fear of Current or Ex-Partner: No    Emotionally Abused: No    Physically Abused: No    Sexually Abused: No  Depression (PHQ2-9): Medium Risk (02/03/2024)   Depression (PHQ2-9)    PHQ-2 Score: 9  Alcohol Screen: Low Risk (09/08/2023)   Alcohol Screen    Last Alcohol Screening Score (AUDIT): 0  Housing: Unknown (09/14/2023)   Epic    Unable to Pay for Housing in the Last Year: No    Number of Times Moved in the Last Year: Not on file    Homeless in the Last Year: No  Utilities: Not At Risk (09/14/2023)   Epic    Threatened with loss of utilities: No  Health Literacy: Adequate Health  Literacy (09/08/2023)   B1300 Health Literacy    Frequency of need for help with medical instructions: Never    Family History  Problem Relation Age of Onset   Hypertension Mother    Kidney disease Mother    Heart disease Mother    Alcoholism Mother    Hypertension Father    Heart disease Father    Stroke Sister    Hypertension Brother    Breast cancer Maternal Grandmother        died at 53   Breast cancer Paternal Grandmother    Cancer Maternal Aunt        unknown form   Breast cancer Paternal Aunt    Breast cancer Paternal Aunt    Breast cancer Cousin        pat first cousin dx in her 24s   Colon cancer Other 76  MGMs brother   Cancer Other        breast ca in [place]   Cancer Other        g uncle colon or stomach ca    Wt Readings from Last 3 Encounters:  05/03/24 121 kg (266 lb 12.8 oz)  04/12/24 120 kg (264 lb 9.6 oz)  02/03/24 116.2 kg (256 lb 2.8 oz)   BP (!) 127/91   Pulse 82   Wt 121 kg (266 lb 12.8 oz)   LMP 05/11/2013   SpO2 96%   BMI 44.40 kg/m   PHYSICAL EXAM: General: Chronically-ill appearing. No distress on 2L [place], walked into clinic Cardiac: JVP ~8 cm. No murmurs  Extremities: Warm and dry.  No peripheral edema.  Neuro: A&O x3. Affect pleasant.   ASSESSMENT & PLAN: 1. 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 H/o PE but pulmonary angiography 2017 without chronic PE - RHC 2/25 with low filling pressures with slightly lower CI/CO and high PVR from previous (RA 2, PA 40/22 (29), PCW 5, CO/CI 5.2/2.5 (td) and 5.1/2.4 (f), PA sat 65%, PVR 4.6 w) - Echo 6/25: EF 55% with mild/mod reduced RV, mild worsening of RV from prior 1/25 - Stable NYHA IIIb. Remains slightly volume up. Initially lost weight with lasix  before starting PRN - Start taking lasix  40 mg + 20 mEq KCL daily. Additional lasix  40 mg + 20 mEq PRN. BMET, Mg, BNP - Prev on torsemide , switch to lasix  d/t cramping patient preference  - Continue spiro 50 mg daily - Home O2 requirement 2-6L  during day, BiPAP at night - Completed Pulm Rehab 11/25 - Followed closely at [facility] on therapy prev on IV treprostinil  (started 10/20). Now transitioned to PO Treprostinil  (3/25). Continues on sildenafil  40 tid, sotatercept, and macitentan  10 daily.  - Defer PAH management to Dr. Janie and [facility] team.   2. OSA - Continue O2 and BiPAP  3. Obesity - Body mass index is 44.4 kg/m. - On tirzepatide   4. PVCs - Zio 7/23 2 runs of SVT and NSVT. PVCs 2.4%  - Zio 8/25: mostly NSR, 4.4% PVC burden, runs of NSVT - continue metoprolol  25 mg bid - denies palpitations  5. HTN - Continue telmisartan  20 mg daily  6. H/o VTE - H/o chronic PE. Likely due to lining stasis from [facility] as opposed to true CTEPH - Continue Xarelto  20 mg daily. No bleeding issues.  7. Hypomagnesemia - 1.3 at last appointment s/p IV mg infusion - taking 400 mg Mg Ox chews - repeat level today  Follow up in 3 months with Dr. Cherrie  Sumeet Geter, NP  2:05 PM  "

## 2024-05-03 NOTE — Patient Instructions (Signed)
 Medication Changes:  CHANGE LASIX  (FUROSEMIDE ) TO 40MG  ONCE DAILY, MAY TAKE AN EXTRA 40MG  DAILY AS NEEDED FOR SWELLING OR WEIGHT GAIN OF 3 POUNDS OVERNIGHT OR 5 POUNDS IN 1 WEEK   Lab Work:  Labs done today, your results will be available in MyChart, we will contact you for abnormal readings.  Follow-Up in: 3 MONTHS AS SCHEDULED WITH DR. CHERRIE   At the [facility], you and your health needs are our priority. We have a designated team specialized in the treatment of Heart Failure. This Care Team includes your primary Heart Failure Specialized Cardiologist (physician), Advanced Practice Providers (APPs- Physician Assistants and Nurse Practitioners), and Pharmacist who all work together to provide you with the care you need, when you need it.   You may see any of the following providers on your designated Care Team at your next follow up:  Dr. Toribio Cherrie Dr. Ezra Shuck Dr. Odis Brownie Greig Mosses, NP Caffie Shed, [place] [facility] [place], [place] Beckey Coe, NP Jordan Lee, NP Tinnie Redman, PharmD   Please be sure to bring in all your medications bottles to every appointment.   Need to Contact Us :  If you have any questions or concerns before your next appointment please send us  a message through [facility] or call our office at [phone].    TO LEAVE A MESSAGE FOR THE NURSE SELECT OPTION 2, PLEASE LEAVE A MESSAGE INCLUDING: YOUR NAME DATE OF BIRTH CALL BACK NUMBER REASON FOR CALL**this is important as we prioritize the call backs  YOU WILL RECEIVE A CALL BACK THE SAME DAY AS LONG AS YOU CALL BEFORE 4:00 PM

## 2024-05-04 ENCOUNTER — Ambulatory Visit (HOSPITAL_COMMUNITY): Payer: Self-pay | Admitting: Cardiology

## 2024-05-04 DIAGNOSIS — I50810 Right heart failure, unspecified: Secondary | ICD-10-CM

## 2024-05-04 LAB — MAGNESIUM: Magnesium: 1.6 mg/dL — ABNORMAL LOW (ref 1.7–2.4)

## 2024-05-04 NOTE — Addendum Note (Signed)
 Addended by: TITA AGE B on: 05/04/2024 09:57 AM   Modules accepted: Orders

## 2024-05-05 ENCOUNTER — Other Ambulatory Visit (HOSPITAL_COMMUNITY): Payer: Self-pay

## 2024-05-11 ENCOUNTER — Ambulatory Visit (HOSPITAL_COMMUNITY)

## 2024-07-31 ENCOUNTER — Ambulatory Visit (HOSPITAL_COMMUNITY): Admitting: Internal Medicine
